# Patient Record
Sex: Male | Born: 1965
Health system: Southern US, Community
[De-identification: ages and names within clinical notes are randomized; demographics above are authoritative.]

## PROBLEM LIST (undated history)

## (undated) DIAGNOSIS — N185 Chronic kidney disease, stage 5: Secondary | ICD-10-CM

## (undated) DIAGNOSIS — E785 Hyperlipidemia, unspecified: Secondary | ICD-10-CM

## (undated) DIAGNOSIS — Z794 Long term (current) use of insulin: Secondary | ICD-10-CM

## (undated) DIAGNOSIS — F32A Depression, unspecified: Secondary | ICD-10-CM

## (undated) DIAGNOSIS — Z95810 Presence of automatic (implantable) cardiac defibrillator: Secondary | ICD-10-CM

## (undated) DIAGNOSIS — D649 Anemia, unspecified: Secondary | ICD-10-CM

## (undated) DIAGNOSIS — E119 Type 2 diabetes mellitus without complications: Secondary | ICD-10-CM

## (undated) DIAGNOSIS — A4159 Other Gram-negative sepsis: Secondary | ICD-10-CM

## (undated) DIAGNOSIS — F209 Schizophrenia, unspecified: Secondary | ICD-10-CM

## (undated) DIAGNOSIS — I10 Essential (primary) hypertension: Secondary | ICD-10-CM

## (undated) DIAGNOSIS — I429 Cardiomyopathy, unspecified: Secondary | ICD-10-CM

## (undated) DIAGNOSIS — R410 Disorientation, unspecified: Secondary | ICD-10-CM

## (undated) DIAGNOSIS — J189 Pneumonia, unspecified organism: Secondary | ICD-10-CM

## (undated) DIAGNOSIS — M3130 Wegener's granulomatosis without renal involvement: Secondary | ICD-10-CM

## (undated) DIAGNOSIS — L0390 Cellulitis, unspecified: Secondary | ICD-10-CM

## (undated) DIAGNOSIS — I4891 Unspecified atrial fibrillation: Secondary | ICD-10-CM

## (undated) HISTORY — DX: Chronic kidney disease, stage 5: N18.5

## (undated) HISTORY — PX: EYE SURGERY: SHX253

---

## 2008-10-28 DIAGNOSIS — I429 Cardiomyopathy, unspecified: Secondary | ICD-10-CM

## 2008-10-28 DIAGNOSIS — Z95810 Presence of automatic (implantable) cardiac defibrillator: Secondary | ICD-10-CM

## 2008-10-28 HISTORY — PX: CARDIAC CATHETERIZATION: SHX172

## 2008-10-28 HISTORY — DX: Cardiomyopathy, unspecified: I42.9

## 2008-10-28 HISTORY — DX: Presence of automatic (implantable) cardiac defibrillator: Z95.810

## 2008-10-28 HISTORY — PX: CARDIAC DEFIBRILLATOR PLACEMENT: SHX171

## 2009-07-21 ENCOUNTER — Encounter: Payer: Self-pay | Admitting: Internal Medicine

## 2009-07-31 ENCOUNTER — Encounter: Payer: Self-pay | Admitting: Internal Medicine

## 2010-01-05 ENCOUNTER — Encounter: Payer: Self-pay | Admitting: Internal Medicine

## 2012-02-26 ENCOUNTER — Encounter (HOSPITAL_BASED_OUTPATIENT_CLINIC_OR_DEPARTMENT_OTHER): Payer: Self-pay | Admitting: Emergency Medicine

## 2012-02-26 ENCOUNTER — Emergency Department (INDEPENDENT_AMBULATORY_CARE_PROVIDER_SITE_OTHER): Payer: Medicaid Other

## 2012-02-26 ENCOUNTER — Emergency Department (HOSPITAL_BASED_OUTPATIENT_CLINIC_OR_DEPARTMENT_OTHER)
Admission: EM | Admit: 2012-02-26 | Discharge: 2012-02-26 | Disposition: A | Discharge status: Home / Self Care | Payer: Medicaid Other | Attending: Emergency Medicine | Admitting: Emergency Medicine

## 2012-02-26 DIAGNOSIS — I10 Essential (primary) hypertension: Secondary | ICD-10-CM | POA: Insufficient documentation

## 2012-02-26 DIAGNOSIS — R109 Unspecified abdominal pain: Secondary | ICD-10-CM | POA: Insufficient documentation

## 2012-02-26 DIAGNOSIS — R634 Abnormal weight loss: Secondary | ICD-10-CM | POA: Insufficient documentation

## 2012-02-26 DIAGNOSIS — R197 Diarrhea, unspecified: Secondary | ICD-10-CM | POA: Insufficient documentation

## 2012-02-26 DIAGNOSIS — E785 Hyperlipidemia, unspecified: Secondary | ICD-10-CM | POA: Insufficient documentation

## 2012-02-26 DIAGNOSIS — Z79899 Other long term (current) drug therapy: Secondary | ICD-10-CM | POA: Insufficient documentation

## 2012-02-26 DIAGNOSIS — Z8659 Personal history of other mental and behavioral disorders: Secondary | ICD-10-CM | POA: Insufficient documentation

## 2012-02-26 DIAGNOSIS — I4891 Unspecified atrial fibrillation: Secondary | ICD-10-CM | POA: Insufficient documentation

## 2012-02-26 HISTORY — DX: Other gram-negative sepsis: A41.59

## 2012-02-26 HISTORY — DX: Unspecified atrial fibrillation: I48.91

## 2012-02-26 HISTORY — DX: Schizophrenia, unspecified: F20.9

## 2012-02-26 HISTORY — DX: Essential (primary) hypertension: I10

## 2012-02-26 HISTORY — DX: Anemia, unspecified: D64.9

## 2012-02-26 HISTORY — DX: Cellulitis, unspecified: L03.90

## 2012-02-26 HISTORY — DX: Hyperlipidemia, unspecified: E78.5

## 2012-02-26 LAB — DIFFERENTIAL
Basophils Absolute: 0 10*3/uL (ref 0.0–0.1)
Basophils Relative: 0 % (ref 0–1)
Eosinophils Absolute: 0.2 10*3/uL (ref 0.0–0.7)
Eosinophils Relative: 3 % (ref 0–5)
Lymphocytes Relative: 25 % (ref 12–46)
Lymphs Abs: 1.7 10*3/uL (ref 0.7–4.0)
Monocytes Absolute: 0.7 10*3/uL (ref 0.1–1.0)
Monocytes Relative: 10 % (ref 3–12)
Neutro Abs: 4.3 10*3/uL (ref 1.7–7.7)
Neutrophils Relative %: 62 % (ref 43–77)

## 2012-02-26 LAB — COMPREHENSIVE METABOLIC PANEL
ALT: 13 U/L (ref 0–53)
AST: 20 U/L (ref 0–37)
Albumin: 3.9 g/dL (ref 3.5–5.2)
Alkaline Phosphatase: 48 U/L (ref 39–117)
BUN: 15 mg/dL (ref 6–23)
CO2: 30 mEq/L (ref 19–32)
Calcium: 9.4 mg/dL (ref 8.4–10.5)
Chloride: 104 mEq/L (ref 96–112)
Creatinine, Ser: 1 mg/dL (ref 0.50–1.35)
GFR calc Af Amer: >90 mL/min (ref >90)
GFR calc non Af Amer: 89 mL/min — ABNORMAL LOW (ref >90)
Glucose, Bld: 91 mg/dL (ref 70–99)
Potassium: 4.6 mEq/L (ref 3.5–5.1)
Sodium: 140 mEq/L (ref 135–145)
Total Bilirubin: 0.6 mg/dL (ref 0.3–1.2)
Total Protein: 7 g/dL (ref 6.0–8.3)

## 2012-02-26 LAB — CBC
HCT: 42.6 % (ref 39.0–52.0)
Hemoglobin: 14.9 g/dL (ref 13.0–17.0)
MCH: 30.8 pg (ref 26.0–34.0)
MCHC: 35 g/dL (ref 30.0–36.0)
MCV: 88 fL (ref 78.0–100.0)
Platelets: 177 10*3/uL (ref 150–400)
RBC: 4.84 MIL/uL (ref 4.22–5.81)
RDW: 12.4 % (ref 11.5–15.5)
WBC: 7 10*3/uL (ref 4.0–10.5)

## 2012-02-26 LAB — URINALYSIS, ROUTINE W REFLEX MICROSCOPIC
Bilirubin Urine: NEGATIVE
Glucose, UA: NEGATIVE mg/dL
Hgb urine dipstick: NEGATIVE
Ketones, ur: NEGATIVE mg/dL
Leukocytes, UA: NEGATIVE
Nitrite: NEGATIVE
Protein, ur: NEGATIVE mg/dL
Specific Gravity, Urine: 1.01 (ref 1.005–1.030)
Urobilinogen, UA: 0.2 mg/dL (ref 0.0–1.0)
pH: 7 (ref 5.0–8.0)

## 2012-02-26 LAB — LIPASE, BLOOD: Lipase: 28 U/L (ref 11–59)

## 2012-02-26 MED ORDER — SODIUM CHLORIDE 0.9 % IV BOLUS (SEPSIS)
500.0000 mL | Freq: Once | INTRAVENOUS | Status: AC
  Administered 2012-02-26: 1000 mL via INTRAVENOUS

## 2012-02-26 MED ORDER — IOHEXOL 300 MG/ML  SOLN
100.0000 mL | Freq: Once | INTRAMUSCULAR | Status: AC | PRN
  Administered 2012-02-26: 100 mL via INTRAVENOUS

## 2012-02-26 MED ORDER — IOHEXOL 300 MG/ML  SOLN
36.0000 mL | Freq: Once | INTRAMUSCULAR | Status: AC | PRN
  Administered 2012-02-26: 36 mL via INTRAVENOUS

## 2012-02-26 MED ORDER — MORPHINE SULFATE 4 MG/ML IJ SOLN
4.0000 mg | Freq: Once | INTRAMUSCULAR | Status: AC
  Administered 2012-02-26: 4 mg via INTRAVENOUS
  Filled 2012-02-26: qty 1

## 2012-02-26 NOTE — ED Provider Notes (Signed)
History     CSN: [id number]  Arrival date & time 02/26/12  [id number]   First MD Initiated Contact with Patient 02/26/12 1000      Chief Complaint  Patient presents with  . Weight Loss    (Consider location/radiation/quality/duration/timing/severity/associated sxs/prior treatment) HPI  H/o HTN, HLD, h/o PANCA glomerulonephritis s/p IV cytoxan and prednisone March 2010, h/o vfib arrest s/p defibrillator, schizophrenia pw multiple complaints. Brother and patient declining [place] interpreter. Per brother patient with increased foul odor of BM and breath at home. States that he's also had 60lb weight loss in 3 years, worse over the last 2 months. Patient without complaints at this time but his brother states "he has mental issues and he never complains of anything". Brother concerned that he may be "sick" and not telling him what's wrong. Pt denies all complaints today. He does state that he has exp cough with white sputum for several days. Denies cp/sob.    ED Notes, ED Provider Notes from 02/26/12 0000 to 02/26/12 09:49:19       Rod Can 02/26/2012 09:42      Brother states he has been loosing weight, foul smelling stool and foul smelling "burp." States he has been like this before and he was "very sick"     Past Medical History  Diagnosis Date  . Hypertension   . Dyslipidemia   . Anemia   . Cellulitis   . Atrial fibrillation   . Enterobacter sepsis   . Schizophrenia     Past Surgical History  Procedure Date  . Cardiac defibrillator placement   . Pacemaker insertion     No family history on file.  Soc hx- no etoh  Review of Systems  All other systems reviewed and are negative.  except as noted HPI   Allergies  Review of patient's allergies indicates no known allergies.  Home Medications   Current Outpatient Rx  Name Route Sig Dispense Refill  . RISPERIDONE 0.5 MG PO TABS Oral Take 0.5 mg by mouth at bedtime.      BP 104/71  Pulse 62  Temp(Src) 98.3 F (36.8  C) (Oral)  Resp 16  Ht 5\' 10"  (1.778 m)  Wt 123 lb (55.792 kg)  BMI 17.65 kg/m2  SpO2 100%  Physical Exam  Nursing note and vitals reviewed. Constitutional: He is oriented to person, place, and time. He appears well-developed and well-nourished. No distress.  HENT:  Head: Atraumatic.  Nose: Nose normal.  Mouth/Throat: Oropharynx is clear and moist. No oropharyngeal exudate.       Poor dentition No tenderness to percussion Gingiva unremarkable  Eyes: Conjunctivae are normal. Pupils are equal, round, and reactive to light.  Neck: Normal range of motion. Neck supple.  Cardiovascular: Normal rate, regular rhythm, normal heart sounds and intact distal pulses.  Exam reveals no gallop and no friction rub.   No murmur heard. Pulmonary/Chest: Effort normal. No respiratory distress. He has no wheezes. He has no rales.  Abdominal: Soft. Bowel sounds are normal. There is tenderness. There is no rebound and no guarding.       Min LUQ/LLQ ttp  Genitourinary:       Brown stool No prostate tenderness  Musculoskeletal: Normal range of motion. He exhibits no edema and no tenderness.  Neurological: He is alert and oriented to person, place, and time. No cranial nerve deficit. He exhibits normal muscle tone. Coordination normal.       Strength 5/5 all extremities No pronator drift No facial droop  Skin: Skin is warm and dry.  Psychiatric: He has a normal mood and affect.    ED Course  Procedures (including critical care time)  Labs Reviewed  COMPREHENSIVE METABOLIC PANEL - Abnormal; Notable for the following:    GFR calc non Af Amer 89 (*)    All other components within normal limits  CBC  DIFFERENTIAL  LIPASE, BLOOD  URINALYSIS, ROUTINE W REFLEX MICROSCOPIC   Dg Chest 2 View  02/26/2012  *RADIOLOGY REPORT*  Clinical Data: Weight loss.  CHEST - 2 VIEW  Comparison: None.  Findings: The lungs are clear without focal infiltrate, edema, pneumothorax or pleural effusion. Cardiopericardial  silhouette is at upper limits of normal for size. Imaged bony structures of the thorax are intact.  IMPRESSION: No acute findings.  Original Report Authenticated By: ERIC A. MANSELL, M.D.   Ct Abdomen Pelvis W Contrast  02/26/2012  *RADIOLOGY REPORT*  Clinical Data: Abdominal pain, weight loss, prior kidney infection  CT ABDOMEN AND PELVIS WITH CONTRAST  Technique:  Multidetector CT imaging of the abdomen and pelvis was performed following the standard protocol during bolus administration of intravenous contrast.  Contrast: 174mL OMNIPAQUE IOHEXOL 300 MG/ML  SOLN  Comparison: None.  Findings: Mild linear scarring at the left lung base.  Cardiomegaly.  ICD lead incompletely visualized.  Liver, spleen, pancreas, and adrenal glands are within normal limits.  Gallbladder is unremarkable.  No intrahepatic or extrahepatic ductal dilatation.  Small left kidney.  Right kidney is within normal limits. Bilateral extrarenal pelvis without hydronephrosis.  No evidence of bowel obstruction.  Appendix is not discretely visualized but there are no inflammatory changes in the right lower quadrant.  No evidence of abdominal aortic aneurysm.  No abdominopelvic ascites.  No suspicious abdominopelvic lymphadenopathy.  Status unremarkable.  Bladder is distended.  Mild degenerative changes at L5-S1.  IMPRESSION: No evidence of bowel obstruction.  Appendix is not discretely visualized but there are no findings to suggest acute appendicitis.  No CT findings to account for the patient's abdominal pain.  Original Report Authenticated By: Julian Hy, M.D.    1. Weight loss   2. Diarrhea   3. Abdominal pain     MDM  Multiple vague complaints. Objectively, min abd ttp. CT A/P unremarkable. Pt c/o cough, CXR without pneumonia. Labs unremarkable. No EMC precluding discharge at this time. Given Precautions for return. PMD f/u.         Blair Heys, MD 02/26/12 1254

## 2012-02-26 NOTE — ED Notes (Signed)
Brother states he has been loosing weight, foul smelling stool and foul smelling "burp."  States he has been like this before and he was "very sick"

## 2012-02-26 NOTE — Discharge Instructions (Signed)
Follow up with your doctor as discussed.  RESOURCE GUIDE  Dental Problems  Patients with Medicaid: [facility] Phone:  [phone]                                                   Phone:  [phone]  If unable to pay or uninsured, contact:  [facility] or [facility]. to become qualified for the adult dental clinic.  Chronic Pain Problems Contact [facility]  [phone] Patients need to be referred by their primary care doctor.  Insufficient Money for Medicine Contact [employer]:  call "211" or [place] [phone].  No Primary Care Doctor Call [facility]  [phone] Other agencies that provide inexpensive medical care    [place]  F2597459    [facility]  [place]  [phone]    [facility]  [phone]    [facility]  [place]  [phone] [facility]  [phone] [place]   [phone] (emergency services [phone])  Abuse/Neglect [place] [phone] [place] [phone] (After Hours)  Emergency [place] [phone]  [place] [facility] [place] [phone] [place] [phone]  MRSA Hotline #:   (754) [phone]    [facility] Dept. [address]         [place] Phone:  [phone]                                  Phone:   [phone]                   Phone:  [place] Phone:  [place] [phone] (After Hours)

## 2012-02-26 NOTE — ED Notes (Signed)
MD at bedside. 

## 2012-02-26 NOTE — ED Notes (Signed)
Patient transported to CT 

## 2012-05-03 ENCOUNTER — Emergency Department (HOSPITAL_BASED_OUTPATIENT_CLINIC_OR_DEPARTMENT_OTHER)
Admission: EM | Admit: 2012-05-03 | Discharge: 2012-05-03 | Disposition: A | Discharge status: Home / Self Care | Payer: Medicaid Other | Attending: Emergency Medicine | Admitting: Emergency Medicine

## 2012-05-03 ENCOUNTER — Encounter (HOSPITAL_BASED_OUTPATIENT_CLINIC_OR_DEPARTMENT_OTHER): Payer: Self-pay | Admitting: *Deleted

## 2012-05-03 ENCOUNTER — Emergency Department (HOSPITAL_BASED_OUTPATIENT_CLINIC_OR_DEPARTMENT_OTHER): Payer: Medicaid Other

## 2012-05-03 DIAGNOSIS — Z8659 Personal history of other mental and behavioral disorders: Secondary | ICD-10-CM | POA: Insufficient documentation

## 2012-05-03 DIAGNOSIS — I10 Essential (primary) hypertension: Secondary | ICD-10-CM | POA: Insufficient documentation

## 2012-05-03 DIAGNOSIS — R7611 Nonspecific reaction to tuberculin skin test without active tuberculosis: Secondary | ICD-10-CM

## 2012-05-03 DIAGNOSIS — I4891 Unspecified atrial fibrillation: Secondary | ICD-10-CM | POA: Insufficient documentation

## 2012-05-03 DIAGNOSIS — E785 Hyperlipidemia, unspecified: Secondary | ICD-10-CM | POA: Insufficient documentation

## 2012-05-03 NOTE — ED Provider Notes (Signed)
History  This chart was scribed for Chauncy Passy, MD by Kevan Rosebush. This patient was seen in room MH11/MH11 and the patient's care was started at 15:04.  CSN: [id number]  Arrival date & time 05/03/12  1440   First MD Initiated Contact with Patient 05/03/12 1504      Chief Complaint  Patient presents with  . PPD Reading    (Consider location/radiation/quality/duration/timing/severity/associated sxs/prior treatment) HPI Translated by family member.   James Nicholson is a 46 y.o. male who presents to the Emergency Department complaining of a Positive PPD test that was placed July 5th. Pt denies any emesis or hemoptysis and has had no known contact with persons with TB. Pt denies any associated coughing. Pt denies having the PPD test preformed previously. Pt is originally from the former [place] and his family reports he likely received the TB vaccination a number of years ago while living there as all of them did. Pt has a h/o coma (3 years ago), atrial fibrilation, HTN, anemia, and has been previously diagnosed with schizophrenia. Niece states that the patient has a defibrillator for the past 3 years after the coma. Pt denies any h/o cancer, prison or prior psychiatric hospitalizations. Pt is being placed in an assisted home care living tomorrow. Niece reports a h/o a form of schizophrenia but denies any hospitalizations. Patient has had no cough, chest pain, shortness of breath, fever, or hemoptysis. Patient had a PPD performed to do to his plan to be admitted to a assisted living. This was noted to be positive and measured at 30 x 30 mm.  Dr. Lovena Le in [place]    Past Medical History  Diagnosis Date  . Hypertension   . Dyslipidemia   . Anemia   . Cellulitis   . Atrial fibrillation   . Enterobacter sepsis   . Schizophrenia     Past Surgical History  Procedure Date  . Cardiac defibrillator placement   . Pacemaker insertion     History reviewed. No pertinent family  history.  History  Substance Use Topics  . Smoking status: Not on file  . Smokeless tobacco: Not on file  . Alcohol Use: No      Review of Systems  Constitutional: Negative.   HENT: Negative.   Eyes: Negative.   Respiratory: Negative.   Cardiovascular: Negative.   Gastrointestinal: Negative.   Genitourinary: Negative.   Musculoskeletal: Negative.   Skin: Negative.   Neurological: Negative.   Hematological: Negative.   Psychiatric/Behavioral: Positive for hallucinations.  All other systems reviewed and are negative.   A complete 10 system review of systems was obtained and all systems are negative except as noted in the HPI and PMH.   Allergies  Review of patient's allergies indicates no known allergies.  Home Medications   Current Outpatient Rx  Name Route Sig Dispense Refill  . RISPERIDONE 0.5 MG PO TABS Oral Take 0.5 mg by mouth at bedtime.      Triage Vitals: BP 109/69  Pulse 62  Temp 99.5 F (37.5 C) (Oral)  Resp 20  Ht 5\' 8"  (1.727 m)  Wt 128 lb (58.06 kg)  BMI 19.46 kg/m2  SpO2 99%  Physical Exam  Nursing note and vitals reviewed. GEN: Well-developed, well-nourished male in no distress HEENT: Atraumatic, normocephalic. Oropharynx clear without erythema EYES: PERRLA BL, no scleral icterus. NECK: Trachea midline, no meningismus CV: regular rate and rhythm. No murmurs, rubs, or gallops PULM: No respiratory distress.  No crackles, wheezes, or rales. GI: soft, non-tender. No  guarding, rebound, or tenderness. + bowel sounds  GU: deferred Neuro: cranial nerves grossly 2-12 intact, no abnormalities of strength or sensation, A and O x 3 MSK: Patient moves all 4 extremities symmetrically, no deformity, edema, or injury noted Skin: No rashes petechiae, purpura, or jaundice. Patient has positive PPD noted on the right forearm with erythema and induration. Psych: Patient with history of hallucinations and delusions chronically. His niece reports that this is a  baseline. There is no suicidal or homicidal ideation.   ED Course  Procedures (including critical care time)  DIAGNOSTIC STUDIES: Oxygen Saturation is 99% on room air, normal by my interpretation.    COORDINATION OF CARE:  1540: Discussed planned course of treatment with the patient, who is agreeable at this time. Will order a chest x-ray.    Labs Reviewed - No data to display Dg Chest 2 View  05/03/2012  *RADIOLOGY REPORT*  Clinical Data: Positive TB skin test.  CHEST - 2 VIEW  Comparison: 02/26/2012.  Findings: Stable normal sized heart and clear lungs.  Stable left subclavian AICD lead.  Normal appearing bones.  IMPRESSION: No evidence of active or previous tuberculosis infection.  Original Report Authenticated By: Gerald Stabs, M.D.     1. Positive PPD       MDM  Patient was evaluated by myself. Based on evaluation patient had chest x-ray performed which was negative. He was having no chest pain or shortness of breath. There were no lesions whatsoever to suggest tuberculosis. Patient may well have had positive results do to prior vaccination. I discussed the patient with Dr. Megan Salon of infectious diseases. He recommended the quantiferon gold assay.  Patient does not require airborne precautions at this time. He should be able to be admitted to his assisted living tomorrow as planned. This should come back in a couple of days. If it is negative the patient will not need any treatment. If it is positive he will need treatment meds for latent tuberculosis with INH. Patient's family was notified of this. Patient was discharged with copy of his chest x-ray showed the assisted living require it.  I personally performed the services described in this documentation, which was scribed in my presence. The recorded information has been reviewed and considered.         Chauncy Passy, MD 05/03/12 1726

## 2012-05-03 NOTE — ED Notes (Addendum)
Sent here from [facility] for +TB test. Site is red and swollen. Mask placed on pt , triaged and taken to Neg Pressure room.

## 2012-05-04 LAB — QUANTIFERON TB GOLD ASSAY (BLOOD)

## 2012-05-05 ENCOUNTER — Telehealth (HOSPITAL_BASED_OUTPATIENT_CLINIC_OR_DEPARTMENT_OTHER): Payer: Self-pay | Admitting: *Deleted

## 2012-05-05 NOTE — ED Notes (Signed)
Received a call from the Lab tech on 05/05/11 stating that the Quantiferon Gold Assay test was canceled due to being in the wrong tubes.  QGA requires a special kit which is not available at the [facility] lab.  Spoke with Dr. Hale Bogus office r/t consult done on 05/03/12 and was told that they can not follow up with this due to being a consult and not having the patient as a regular patient.  Call placed to Dr. Selinda Michaels office due to him showing as the pt's PCP.  Was told that the pt is a new pt for them and that they have not seen him and can not give orders for additional testing due to them being cardiology.  Chart reviewed with Dr. Dorna Mai, told to refer patient to the nursing home physician for continued out patient testing.  Call placed to home phone and spoke with the brother of [facility], and he will contact the nursing home to confirm if testing is still necessary Stated he will take the pt to Urgent care if needed.  Explained to brother and niece the name of the test and stressed that the blood will need to be drawn in a special kit and to make sure that the proper kit is used where ever the test is performed.  Verbalized understanding.

## 2012-05-07 ENCOUNTER — Encounter: Payer: Self-pay | Admitting: Internal Medicine

## 2012-05-07 ENCOUNTER — Ambulatory Visit (INDEPENDENT_AMBULATORY_CARE_PROVIDER_SITE_OTHER): Payer: Medicaid Other | Admitting: Internal Medicine

## 2012-05-07 VITALS — BP 122/68 | HR 67 | Ht 68.0 in | Wt 132.0 lb

## 2012-05-07 DIAGNOSIS — Z95810 Presence of automatic (implantable) cardiac defibrillator: Secondary | ICD-10-CM

## 2012-05-07 DIAGNOSIS — I428 Other cardiomyopathies: Secondary | ICD-10-CM | POA: Insufficient documentation

## 2012-05-07 DIAGNOSIS — I4901 Ventricular fibrillation: Secondary | ICD-10-CM

## 2012-05-07 DIAGNOSIS — I10 Essential (primary) hypertension: Secondary | ICD-10-CM | POA: Insufficient documentation

## 2012-05-07 LAB — ICD DEVICE OBSERVATION
BATTERY VOLTAGE: 3.1261 V
BRDY-0002RV: 40 {beats}/min
CHARGE TIME: 8.808 s
DEV-0020ICD: NEGATIVE
FVT: 0
PACEART VT: 0
RV LEAD AMPLITUDE: 3.375 mv
RV LEAD IMPEDENCE ICD: 684 Ohm
RV LEAD THRESHOLD: 0.75 V
TOT-0001: 9
TOT-0002: 0
TOT-0006: 20100415000000
TZAT-0001FASTVT: 1
TZAT-0001FASTVT: 2
TZAT-0001SLOWVT: 1
TZAT-0001SLOWVT: 2
TZAT-0002SLOWVT: NEGATIVE
TZAT-0002SLOWVT: NEGATIVE
TZAT-0004FASTVT: 10
TZAT-0004FASTVT: 10
TZAT-0005FASTVT: 88 pct
TZAT-0005FASTVT: 91 pct
TZAT-0011FASTVT: 10 ms
TZAT-0011FASTVT: 10 ms
TZAT-0012FASTVT: 200 ms
TZAT-0012FASTVT: 200 ms
TZAT-0012SLOWVT: 200 ms
TZAT-0012SLOWVT: 200 ms
TZAT-0013FASTVT: 1
TZAT-0013FASTVT: 2
TZAT-0018FASTVT: NEGATIVE
TZAT-0018FASTVT: NEGATIVE
TZAT-0018SLOWVT: NEGATIVE
TZAT-0018SLOWVT: NEGATIVE
TZAT-0019FASTVT: 8 V
TZAT-0019FASTVT: 8 V
TZAT-0019SLOWVT: 8 V
TZAT-0019SLOWVT: 8 V
TZAT-0020FASTVT: 1.5 ms
TZAT-0020FASTVT: 1.5 ms
TZAT-0020SLOWVT: 1.5 ms
TZAT-0020SLOWVT: 1.5 ms
TZON-0003FASTVT: 270 ms
TZON-0003SLOWVT: 390 ms
TZON-0003VSLOWVT: 450 ms
TZON-0004SLOWVT: 16
TZON-0004VSLOWVT: 20
TZON-0005SLOWVT: 12
TZST-0001FASTVT: 3
TZST-0001FASTVT: 4
TZST-0001FASTVT: 5
TZST-0001FASTVT: 6
TZST-0001SLOWVT: 3
TZST-0001SLOWVT: 4
TZST-0001SLOWVT: 5
TZST-0001SLOWVT: 6
TZST-0002SLOWVT: NEGATIVE
TZST-0002SLOWVT: NEGATIVE
TZST-0002SLOWVT: NEGATIVE
TZST-0002SLOWVT: NEGATIVE
TZST-0003FASTVT: 20 J
TZST-0003FASTVT: 35 J
TZST-0003FASTVT: 35 J
TZST-0003FASTVT: 35 J
VENTRICULAR PACING ICD: 0 pct
VF: 0

## 2012-05-07 NOTE — Patient Instructions (Addendum)
Your physician recommends that you schedule a follow-up appointment in: 3 months in the device clinic and 12 months with Dr Taylor  

## 2012-05-07 NOTE — Assessment & Plan Note (Signed)
His device is working normally. We'll plan to recheck in several months. He has had no recurrent ventricular arrhythmias.

## 2012-05-07 NOTE — Assessment & Plan Note (Signed)
His ventricular arrhythmias have remained well controlled.

## 2012-05-07 NOTE — Progress Notes (Signed)
HPI James Nicholson is referred today for for ongoing evaluation and management of his ICD. The patient is a very pleasant 46 year old man with multiple medical problems. He had a resuscitated ventricular fibrillation arrest several years ago and underwent ICD implantation. He also has glomerulonephritis which has responded to Cytoxan and prednisone. He has schizophrenia. The patient recently moved from [facility] to [place]. He moved here to be closer to his brother. He denies any recent ICD shocks. He has class II heart failure symptoms. No Known Allergies   Current Outpatient Prescriptions  Medication Sig Dispense Refill  . risperiDONE (RISPERDAL) 0.5 MG tablet Take 0.5 mg by mouth daily as needed.          Past Medical History  Diagnosis Date  . Hypertension   . Dyslipidemia   . Anemia   . Cellulitis   . Atrial fibrillation   . Enterobacter sepsis   . Schizophrenia     ROS:   All systems reviewed and negative except as noted in the HPI.   Past Surgical History  Procedure Date  . Cardiac defibrillator placement   . Pacemaker insertion      No family history on file.   History   Social History  . Marital Status: Single    Spouse Name: N/A    Number of Children: N/A  . Years of Education: N/A   Occupational History  . Not on file.   Social History Main Topics  . Smoking status: Former Smoker    Quit date: 10/28/2008  . Smokeless tobacco: Not on file  . Alcohol Use: No  . Drug Use: No  . Sexually Active: Not on file   Other Topics Concern  . Not on file   Social History Narrative  . No narrative on file     BP 122/68  Pulse 67  Ht 5\' 8"  (1.727 m)  Wt 132 lb (59.875 kg)  BMI 20.07 kg/m2  Physical Exam:  Well appearing middle-aged man, NAD HEENT: Unremarkable Neck:  No JVD, no thyromegally Lungs:  Clear with no wheezes, rales, or rhonchi. Well-healed ICD incision. HEART:  Regular rate rhythm, no murmurs, no rubs, no  clicks Abd:  soft, positive bowel sounds, no organomegally, no rebound, no guarding Ext:  2 plus pulses, no edema, no cyanosis, no clubbing Skin:  No rashes no nodules Neuro:  CN II through XII intact, motor grossly intact  DEVICE  Normal device function.  See PaceArt for details.   Assess/Plan:

## 2012-05-22 ENCOUNTER — Encounter: Payer: Self-pay | Admitting: Internal Medicine

## 2012-05-22 ENCOUNTER — Ambulatory Visit (INDEPENDENT_AMBULATORY_CARE_PROVIDER_SITE_OTHER): Payer: Medicaid Other | Admitting: Internal Medicine

## 2012-05-22 VITALS — BP 119/76 | HR 69 | Temp 97.7°F | Wt 130.0 lb

## 2012-05-22 DIAGNOSIS — Z227 Latent tuberculosis: Secondary | ICD-10-CM

## 2012-05-22 DIAGNOSIS — A150 Tuberculosis of lung: Secondary | ICD-10-CM

## 2012-05-22 DIAGNOSIS — R7611 Nonspecific reaction to tuberculin skin test without active tuberculosis: Secondary | ICD-10-CM

## 2012-05-22 LAB — COMPLETE METABOLIC PANEL WITH GFR
ALT: 20 U/L (ref 0–53)
AST: 22 U/L (ref 0–37)
Albumin: 4.5 g/dL (ref 3.5–5.2)
Alkaline Phosphatase: 48 U/L (ref 39–117)
BUN: 16 mg/dL (ref 6–23)
CO2: 30 mEq/L (ref 19–32)
Calcium: 9.6 mg/dL (ref 8.4–10.5)
Chloride: 101 mEq/L (ref 96–112)
Creat: 1.15 mg/dL (ref 0.50–1.35)
GFR, Est African American: 88 mL/min
GFR, Est Non African American: 76 mL/min
Glucose, Bld: 56 mg/dL — ABNORMAL LOW (ref 70–99)
Potassium: 4.4 mEq/L (ref 3.5–5.3)
Sodium: 139 mEq/L (ref 135–145)
Total Bilirubin: 0.8 mg/dL (ref 0.3–1.2)
Total Protein: 7 g/dL (ref 6.0–8.3)

## 2012-05-22 LAB — CBC WITH DIFFERENTIAL/PLATELET
Basophils Absolute: 0 10*3/uL (ref 0.0–0.1)
Basophils Relative: 1 % (ref 0–1)
Eosinophils Absolute: 0.3 10*3/uL (ref 0.0–0.7)
Eosinophils Relative: 5 % (ref 0–5)
HCT: 43.4 % (ref 39.0–52.0)
Hemoglobin: 15.7 g/dL (ref 13.0–17.0)
Lymphocytes Relative: 34 % (ref 12–46)
Lymphs Abs: 2.1 10*3/uL (ref 0.7–4.0)
MCH: 31 pg (ref 26.0–34.0)
MCHC: 36.2 g/dL — ABNORMAL HIGH (ref 30.0–36.0)
MCV: 85.8 fL (ref 78.0–100.0)
Monocytes Absolute: 0.6 10*3/uL (ref 0.1–1.0)
Monocytes Relative: 10 % (ref 3–12)
Neutro Abs: 3.1 10*3/uL (ref 1.7–7.7)
Neutrophils Relative %: 50 % (ref 43–77)
Platelets: 192 10*3/uL (ref 150–400)
RBC: 5.06 MIL/uL (ref 4.22–5.81)
RDW: 13.3 % (ref 11.5–15.5)
WBC: 6.1 10*3/uL (ref 4.0–10.5)

## 2012-05-22 MED ORDER — ISONIAZID 300 MG PO TABS: 300.0000 mg | ORAL_TABLET | Freq: Every day | ORAL | Status: DC

## 2012-05-22 MED ORDER — PYRIDOXINE HCL 50 MG PO TABS: 50.0000 mg | ORAL_TABLET | Freq: Every day | ORAL | Status: DC

## 2012-05-22 MED ORDER — ISONIAZID 300 MG PO TABS: 300.0000 mg | ORAL_TABLET | Freq: Every day | ORAL | Status: AC

## 2012-05-25 DIAGNOSIS — R7611 Nonspecific reaction to tuberculin skin test without active tuberculosis: Secondary | ICD-10-CM | POA: Insufficient documentation

## 2012-05-25 NOTE — Progress Notes (Signed)
  Subjective:    Patient ID: James Nicholson, male    DOB: [birth year], 46 y.o.   MRN: [id number]  HPI New patient who comes in for evaluation of a positive ppd.  He was being admitted to a nursing home and was sent to the ED due to the positive ppd.  He is origninally from [place].  Denies any known history of Tb exposure, has never been in jail and no recent positive HIV.  He did have a Quantiferon sent from ED but it was in the wrong tube.  No cough, no SOB, no weight loss no night sweats.  He was evaluated in ED and no active signs of disease on CXR/CT.  He is here with his brother who is translating.  He has had some mental incapacity relating to a cardiovascular accident and has limited understanding, according to his brother.     Review of Systems  Constitutional: Negative for fever, chills, activity change, appetite change, fatigue and unexpected weight change.  Respiratory: Negative for cough, chest tightness, shortness of breath, wheezing and stridor.   Cardiovascular: Negative for chest pain, palpitations and leg swelling.  Gastrointestinal: Negative for vomiting, abdominal pain, diarrhea, constipation, abdominal distention and rectal pain.  Musculoskeletal: Negative for myalgias, joint swelling and arthralgias.  Skin: Negative for rash.  Hematological: Negative for adenopathy.  Psychiatric/Behavioral: The patient is not nervous/anxious.        Objective:   Physical Exam  Constitutional: He appears well-developed and well-nourished. No distress.  Cardiovascular: Normal rate, regular rhythm and normal heart sounds.  Exam reveals no gallop and no friction rub.   No murmur heard. Pulmonary/Chest: Effort normal and breath sounds normal. No respiratory distress. He has no wheezes. He has no rales.  Abdominal: Soft. Bowel sounds are normal. He exhibits no distension. There is no tenderness. There is no rebound.  Skin: No rash noted.          Assessment & Plan:

## 2012-05-25 NOTE — Assessment & Plan Note (Signed)
He does have a positive ppd, confirmed in the ED on their exam.  I will treat him with 9 months of INH.  LFTs today.  RTC in one month.  Side effects described.

## 2012-06-15 ENCOUNTER — Encounter: Payer: Self-pay | Admitting: Internal Medicine

## 2012-06-23 ENCOUNTER — Encounter: Payer: Self-pay | Admitting: Internal Medicine

## 2012-06-23 ENCOUNTER — Ambulatory Visit (INDEPENDENT_AMBULATORY_CARE_PROVIDER_SITE_OTHER): Payer: Medicaid Other | Admitting: Internal Medicine

## 2012-06-23 VITALS — BP 107/74 | HR 59 | Temp 98.1°F | Ht 67.0 in | Wt 133.0 lb

## 2012-06-23 DIAGNOSIS — R7611 Nonspecific reaction to tuberculin skin test without active tuberculosis: Secondary | ICD-10-CM

## 2012-06-23 NOTE — Progress Notes (Signed)
  Subjective:    Patient ID: James Nicholson, male    DOB: 06/21/1966, 46 y.o.   MRN: [id number]  HPI He comes in here for followup after starting [place] for positive PPD. He was also placed on vitamin B6. He continues to live in a nursing home and is accompanied by someone from the facility. He started one month ago and is tolerating the medicine well. No abnormal pain, minimal episodes of nausea but no weight loss and no difficulty eating. He has no specific complaints today   Review of Systems  Constitutional: Negative for fever, chills, fatigue and unexpected weight change.  Gastrointestinal: Negative for nausea, vomiting, abdominal pain, diarrhea, constipation and abdominal distention.  Musculoskeletal: Negative for myalgias, joint swelling and arthralgias.  Skin: Negative for rash.  Psychiatric/Behavioral: The patient is not nervous/anxious.        Objective:   Physical Exam  Constitutional: He appears well-developed and well-nourished. No distress.  Cardiovascular: Normal rate, regular rhythm and normal heart sounds.  Exam reveals no gallop and no friction rub.   No murmur heard. Pulmonary/Chest: Effort normal and breath sounds normal. No respiratory distress. He has no wheezes. He has no rales.  Abdominal: Soft. Bowel sounds are normal. He exhibits no distension. There is no tenderness. There is no rebound.  Skin: Skin is warm and dry. No rash noted.  Psychiatric: He has a normal mood and affect.          Assessment & Plan:

## 2012-06-23 NOTE — Assessment & Plan Note (Signed)
He is doing well with this therapy. No indication to check labs as he has no significant symptoms. He will continue to complete 8 more months of therapy. I will have him followup towards the end of his therapy to assure he has continued and is doing well. He knows to call if he has any significant side effects prior to that to be seen.

## 2012-08-04 ENCOUNTER — Encounter: Payer: Self-pay | Admitting: *Deleted

## 2012-08-13 ENCOUNTER — Ambulatory Visit (INDEPENDENT_AMBULATORY_CARE_PROVIDER_SITE_OTHER): Payer: Medicaid Other | Admitting: *Deleted

## 2012-08-13 DIAGNOSIS — I4901 Ventricular fibrillation: Secondary | ICD-10-CM

## 2012-08-13 DIAGNOSIS — I428 Other cardiomyopathies: Secondary | ICD-10-CM

## 2012-08-13 LAB — ICD DEVICE OBSERVATION
BATTERY VOLTAGE: 3.0848 V
BRDY-0002RV: 40 {beats}/min
CHARGE TIME: 8.898 s
DEV-0020ICD: NEGATIVE
FVT: 0
PACEART VT: 0
RV LEAD AMPLITUDE: 3.75 mv
RV LEAD IMPEDENCE ICD: 646 Ohm
RV LEAD THRESHOLD: 0.5 V
TOT-0001: 9
TOT-0002: 0
TOT-0006: 20100415000000
TZAT-0001FASTVT: 1
TZAT-0001FASTVT: 2
TZAT-0001SLOWVT: 1
TZAT-0001SLOWVT: 2
TZAT-0002SLOWVT: NEGATIVE
TZAT-0002SLOWVT: NEGATIVE
TZAT-0004FASTVT: 10
TZAT-0004FASTVT: 10
TZAT-0005FASTVT: 88 pct
TZAT-0005FASTVT: 91 pct
TZAT-0011FASTVT: 10 ms
TZAT-0011FASTVT: 10 ms
TZAT-0012FASTVT: 200 ms
TZAT-0012FASTVT: 200 ms
TZAT-0012SLOWVT: 200 ms
TZAT-0012SLOWVT: 200 ms
TZAT-0013FASTVT: 1
TZAT-0013FASTVT: 2
TZAT-0018FASTVT: NEGATIVE
TZAT-0018FASTVT: NEGATIVE
TZAT-0018SLOWVT: NEGATIVE
TZAT-0018SLOWVT: NEGATIVE
TZAT-0019FASTVT: 8 V
TZAT-0019FASTVT: 8 V
TZAT-0019SLOWVT: 8 V
TZAT-0019SLOWVT: 8 V
TZAT-0020FASTVT: 1.5 ms
TZAT-0020FASTVT: 1.5 ms
TZAT-0020SLOWVT: 1.5 ms
TZAT-0020SLOWVT: 1.5 ms
TZON-0003FASTVT: 270 ms
TZON-0003SLOWVT: 390 ms
TZON-0003VSLOWVT: 450 ms
TZON-0004SLOWVT: 16
TZON-0004VSLOWVT: 20
TZON-0005SLOWVT: 12
TZST-0001FASTVT: 3
TZST-0001FASTVT: 4
TZST-0001FASTVT: 5
TZST-0001FASTVT: 6
TZST-0001SLOWVT: 3
TZST-0001SLOWVT: 4
TZST-0001SLOWVT: 5
TZST-0001SLOWVT: 6
TZST-0002SLOWVT: NEGATIVE
TZST-0002SLOWVT: NEGATIVE
TZST-0002SLOWVT: NEGATIVE
TZST-0002SLOWVT: NEGATIVE
TZST-0003FASTVT: 20 J
TZST-0003FASTVT: 35 J
TZST-0003FASTVT: 35 J
TZST-0003FASTVT: 35 J
VENTRICULAR PACING ICD: 0.02 pct
VF: 0

## 2012-08-13 NOTE — Progress Notes (Signed)
ICD check with ICM 

## 2012-08-31 ENCOUNTER — Encounter: Payer: Self-pay | Admitting: Internal Medicine

## 2012-11-12 ENCOUNTER — Encounter: Payer: Self-pay | Admitting: Internal Medicine

## 2012-11-27 ENCOUNTER — Encounter: Payer: Self-pay | Admitting: Internal Medicine

## 2012-12-24 ENCOUNTER — Ambulatory Visit: Payer: Medicaid Other | Admitting: Internal Medicine

## 2012-12-28 ENCOUNTER — Other Ambulatory Visit: Payer: Self-pay | Admitting: Internal Medicine

## 2012-12-28 ENCOUNTER — Encounter: Payer: Self-pay | Admitting: Internal Medicine

## 2012-12-28 ENCOUNTER — Ambulatory Visit (INDEPENDENT_AMBULATORY_CARE_PROVIDER_SITE_OTHER): Payer: Medicaid Other | Admitting: *Deleted

## 2012-12-28 DIAGNOSIS — I4901 Ventricular fibrillation: Secondary | ICD-10-CM

## 2012-12-28 DIAGNOSIS — I428 Other cardiomyopathies: Secondary | ICD-10-CM

## 2012-12-28 NOTE — Progress Notes (Signed)
ICD check with ICM 

## 2012-12-29 LAB — ICD DEVICE OBSERVATION
BATTERY VOLTAGE: 3.1057 V
BRDY-0002RV: 40 {beats}/min
CHARGE TIME: 8.898 s
DEV-0020ICD: NEGATIVE
FVT: 0
PACEART VT: 0
RV LEAD AMPLITUDE: 3.5 mv
RV LEAD IMPEDENCE ICD: 627 Ohm
RV LEAD THRESHOLD: 0.75 V
TOT-0001: 9
TOT-0002: 0
TOT-0006: 20100415000000
TZAT-0001FASTVT: 1
TZAT-0001FASTVT: 2
TZAT-0001SLOWVT: 1
TZAT-0001SLOWVT: 2
TZAT-0002SLOWVT: NEGATIVE
TZAT-0002SLOWVT: NEGATIVE
TZAT-0004FASTVT: 10
TZAT-0004FASTVT: 10
TZAT-0005FASTVT: 88 pct
TZAT-0005FASTVT: 91 pct
TZAT-0011FASTVT: 10 ms
TZAT-0011FASTVT: 10 ms
TZAT-0012FASTVT: 200 ms
TZAT-0012FASTVT: 200 ms
TZAT-0012SLOWVT: 200 ms
TZAT-0012SLOWVT: 200 ms
TZAT-0013FASTVT: 1
TZAT-0013FASTVT: 2
TZAT-0018FASTVT: NEGATIVE
TZAT-0018FASTVT: NEGATIVE
TZAT-0018SLOWVT: NEGATIVE
TZAT-0018SLOWVT: NEGATIVE
TZAT-0019FASTVT: 8 V
TZAT-0019FASTVT: 8 V
TZAT-0019SLOWVT: 8 V
TZAT-0019SLOWVT: 8 V
TZAT-0020FASTVT: 1.5 ms
TZAT-0020FASTVT: 1.5 ms
TZAT-0020SLOWVT: 1.5 ms
TZAT-0020SLOWVT: 1.5 ms
TZON-0003FASTVT: 270 ms
TZON-0003SLOWVT: 390 ms
TZON-0003VSLOWVT: 450 ms
TZON-0004SLOWVT: 32
TZON-0004VSLOWVT: 20
TZON-0005SLOWVT: 12
TZST-0001FASTVT: 3
TZST-0001FASTVT: 4
TZST-0001FASTVT: 5
TZST-0001FASTVT: 6
TZST-0001SLOWVT: 3
TZST-0001SLOWVT: 4
TZST-0001SLOWVT: 5
TZST-0001SLOWVT: 6
TZST-0002SLOWVT: NEGATIVE
TZST-0002SLOWVT: NEGATIVE
TZST-0002SLOWVT: NEGATIVE
TZST-0002SLOWVT: NEGATIVE
TZST-0003FASTVT: 20 J
TZST-0003FASTVT: 35 J
TZST-0003FASTVT: 35 J
TZST-0003FASTVT: 35 J
VENTRICULAR PACING ICD: 0.01 pct
VF: 0

## 2013-01-26 ENCOUNTER — Ambulatory Visit: Payer: Medicaid Other | Admitting: Internal Medicine

## 2013-02-04 ENCOUNTER — Encounter (HOSPITAL_COMMUNITY): Payer: Self-pay | Admitting: Emergency Medicine

## 2013-02-04 ENCOUNTER — Inpatient Hospital Stay (HOSPITAL_COMMUNITY)
Admission: EM | Admit: 2013-02-04 | Discharge: 2013-02-11 | DRG: 673 | Payer: Medicaid Other | Attending: Internal Medicine | Admitting: Internal Medicine

## 2013-02-04 ENCOUNTER — Emergency Department (HOSPITAL_COMMUNITY): Payer: Medicaid Other

## 2013-02-04 DIAGNOSIS — Z95810 Presence of automatic (implantable) cardiac defibrillator: Secondary | ICD-10-CM

## 2013-02-04 DIAGNOSIS — E872 Acidosis, unspecified: Secondary | ICD-10-CM | POA: Diagnosis present

## 2013-02-04 DIAGNOSIS — R7611 Nonspecific reaction to tuberculin skin test without active tuberculosis: Secondary | ICD-10-CM | POA: Diagnosis present

## 2013-02-04 DIAGNOSIS — R195 Other fecal abnormalities: Secondary | ICD-10-CM | POA: Diagnosis present

## 2013-02-04 DIAGNOSIS — D649 Anemia, unspecified: Secondary | ICD-10-CM

## 2013-02-04 DIAGNOSIS — N179 Acute kidney failure, unspecified: Secondary | ICD-10-CM | POA: Diagnosis present

## 2013-02-04 DIAGNOSIS — F209 Schizophrenia, unspecified: Secondary | ICD-10-CM | POA: Diagnosis present

## 2013-02-04 DIAGNOSIS — N189 Chronic kidney disease, unspecified: Secondary | ICD-10-CM | POA: Diagnosis present

## 2013-02-04 DIAGNOSIS — N19 Unspecified kidney failure: Secondary | ICD-10-CM

## 2013-02-04 DIAGNOSIS — R197 Diarrhea, unspecified: Secondary | ICD-10-CM | POA: Diagnosis present

## 2013-02-04 DIAGNOSIS — R609 Edema, unspecified: Secondary | ICD-10-CM | POA: Diagnosis present

## 2013-02-04 DIAGNOSIS — M3130 Wegener's granulomatosis without renal involvement: Secondary | ICD-10-CM | POA: Diagnosis present

## 2013-02-04 DIAGNOSIS — R040 Epistaxis: Secondary | ICD-10-CM | POA: Diagnosis present

## 2013-02-04 DIAGNOSIS — F909 Attention-deficit hyperactivity disorder, unspecified type: Secondary | ICD-10-CM | POA: Diagnosis present

## 2013-02-04 DIAGNOSIS — R7309 Other abnormal glucose: Secondary | ICD-10-CM | POA: Diagnosis present

## 2013-02-04 DIAGNOSIS — D631 Anemia in chronic kidney disease: Secondary | ICD-10-CM | POA: Diagnosis present

## 2013-02-04 DIAGNOSIS — R5381 Other malaise: Secondary | ICD-10-CM | POA: Diagnosis present

## 2013-02-04 DIAGNOSIS — K922 Gastrointestinal hemorrhage, unspecified: Secondary | ICD-10-CM | POA: Diagnosis present

## 2013-02-04 DIAGNOSIS — R42 Dizziness and giddiness: Secondary | ICD-10-CM | POA: Diagnosis present

## 2013-02-04 DIAGNOSIS — Z8249 Family history of ischemic heart disease and other diseases of the circulatory system: Secondary | ICD-10-CM

## 2013-02-04 DIAGNOSIS — J189 Pneumonia, unspecified organism: Secondary | ICD-10-CM | POA: Diagnosis present

## 2013-02-04 DIAGNOSIS — Z9119 Patient's noncompliance with other medical treatment and regimen: Secondary | ICD-10-CM

## 2013-02-04 DIAGNOSIS — T380X5A Adverse effect of glucocorticoids and synthetic analogues, initial encounter: Secondary | ICD-10-CM | POA: Diagnosis present

## 2013-02-04 DIAGNOSIS — N186 End stage renal disease: Secondary | ICD-10-CM | POA: Diagnosis present

## 2013-02-04 DIAGNOSIS — I428 Other cardiomyopathies: Secondary | ICD-10-CM | POA: Diagnosis present

## 2013-02-04 DIAGNOSIS — D62 Acute posthemorrhagic anemia: Secondary | ICD-10-CM | POA: Diagnosis present

## 2013-02-04 DIAGNOSIS — Z87891 Personal history of nicotine dependence: Secondary | ICD-10-CM

## 2013-02-04 DIAGNOSIS — Z91199 Patient's noncompliance with other medical treatment and regimen due to unspecified reason: Secondary | ICD-10-CM

## 2013-02-04 DIAGNOSIS — E875 Hyperkalemia: Secondary | ICD-10-CM | POA: Diagnosis present

## 2013-02-04 LAB — COMPREHENSIVE METABOLIC PANEL
ALT: <5 U/L (ref 0–53)
AST: 16 U/L (ref 0–37)
Albumin: 2.8 g/dL — ABNORMAL LOW (ref 3.5–5.2)
Alkaline Phosphatase: 52 U/L (ref 39–117)
BUN: 91 mg/dL — ABNORMAL HIGH (ref 6–23)
CO2: 15 mEq/L — ABNORMAL LOW (ref 19–32)
Calcium: 8.8 mg/dL (ref 8.4–10.5)
Chloride: 103 mEq/L (ref 96–112)
Creatinine, Ser: 10.25 mg/dL — ABNORMAL HIGH (ref 0.50–1.35)
GFR calc Af Amer: 6 mL/min — ABNORMAL LOW (ref >90)
GFR calc non Af Amer: 5 mL/min — ABNORMAL LOW (ref >90)
Glucose, Bld: 124 mg/dL — ABNORMAL HIGH (ref 70–99)
Potassium: 5.7 mEq/L — ABNORMAL HIGH (ref 3.5–5.1)
Sodium: 133 mEq/L — ABNORMAL LOW (ref 135–145)
Total Bilirubin: 0.5 mg/dL (ref 0.3–1.2)
Total Protein: 7.1 g/dL (ref 6.0–8.3)

## 2013-02-04 LAB — OCCULT BLOOD, POC DEVICE: Fecal Occult Bld: POSITIVE — AB

## 2013-02-04 LAB — CBC WITH DIFFERENTIAL/PLATELET
Basophils Absolute: 0 10*3/uL (ref 0.0–0.1)
Basophils Relative: 0 % (ref 0–1)
Eosinophils Absolute: 0.5 10*3/uL (ref 0.0–0.7)
Eosinophils Relative: 5 % (ref 0–5)
HCT: 20.2 % — ABNORMAL LOW (ref 39.0–52.0)
Hemoglobin: 7 g/dL — ABNORMAL LOW (ref 13.0–17.0)
Lymphocytes Relative: 15 % (ref 12–46)
Lymphs Abs: 1.4 10*3/uL (ref 0.7–4.0)
MCH: 28.5 pg (ref 26.0–34.0)
MCHC: 34.7 g/dL (ref 30.0–36.0)
MCV: 82.1 fL (ref 78.0–100.0)
Monocytes Absolute: 0.5 10*3/uL (ref 0.1–1.0)
Monocytes Relative: 6 % (ref 3–12)
Neutro Abs: 7 10*3/uL (ref 1.7–7.7)
Neutrophils Relative %: 75 % (ref 43–77)
Platelets: 296 10*3/uL (ref 150–400)
RBC: 2.46 MIL/uL — ABNORMAL LOW (ref 4.22–5.81)
RDW: 14.3 % (ref 11.5–15.5)
WBC: 9.4 10*3/uL (ref 4.0–10.5)

## 2013-02-04 MED ORDER — SODIUM POLYSTYRENE SULFONATE 15 GM/60ML PO SUSP
15.0000 g | Freq: Once | ORAL | Status: AC
  Administered 2013-02-05: 15 g via ORAL
  Filled 2013-02-04: qty 60

## 2013-02-04 MED ORDER — VANCOMYCIN HCL IN DEXTROSE 1-5 GM/200ML-% IV SOLN
1000.0000 mg | INTRAVENOUS | Status: DC
  Administered 2013-02-05: 1000 mg via INTRAVENOUS
  Filled 2013-02-04: qty 200

## 2013-02-04 MED ORDER — PIPERACILLIN-TAZOBACTAM 3.375 G IVPB 30 MIN
3.3750 g | Freq: Once | INTRAVENOUS | Status: AC
  Administered 2013-02-05: 3.375 g via INTRAVENOUS
  Filled 2013-02-04: qty 50

## 2013-02-04 NOTE — ED Notes (Signed)
PT. REPORTS PRODUCTIVE COUGH WITH LOW ABDOMINAL PAIN /LOWER LEGS PAIN / GENERALIZED WEAKNESS FOR SEVERAL DAYS , BROTHER TRANSLATING ( [place]) FOR PT.

## 2013-02-04 NOTE — ED Provider Notes (Signed)
History     CSN: [id number]  Arrival date & time 02/04/13  2050   First MD Initiated Contact with Patient 02/04/13 2335      Chief Complaint  Patient presents with  . Cough  . Abdominal Pain    (Consider location/radiation/quality/duration/timing/severity/associated sxs/prior treatment) The history is provided by the patient and a relative. The history is limited by a language barrier. A language interpreter was used.  Bren Pyatt is a 47 y.o. male hx of HTN, schizophrenia, anemia, renal failure here with ab pain, weakness, cough. For the last several days he has some productive cough with some blood tinged sputum. Oral he also has some diffuse abdominal pain with blood in the stool. Today he was found by his brother just diffusely weak with leg swelling. As per brother he has history of kidney disease and has been followed up at [facility] and has not seen any nephrologist here. He has never been on dialysis.   Translation by brother   Past Medical History  Diagnosis Date  . Hypertension   . Dyslipidemia   . Anemia   . Cellulitis   . Atrial fibrillation   . Enterobacter sepsis   . Schizophrenia     Past Surgical History  Procedure Laterality Date  . Cardiac defibrillator placement    . Pacemaker insertion      No family history on file.  History  Substance Use Topics  . Smoking status: Former Smoker    Quit date: 10/28/2008  . Smokeless tobacco: Never Used  . Alcohol Use: No      Review of Systems  Respiratory: Positive for cough.   Gastrointestinal: Positive for abdominal pain and blood in stool.  All other systems reviewed and are negative.    Allergies  Review of patient's allergies indicates no known allergies.  Home Medications   Current Outpatient Rx  Name  Route  Sig  Dispense  Refill  . gabapentin (NEURONTIN) 300 MG capsule   Oral   Take 300 mg by mouth 2 (two) times daily.         Marland Kitchen isoniazid (NYDRAZID) 300 MG tablet   Oral   Take  300 mg by mouth daily.         . naproxen (NAPROSYN) 500 MG tablet   Oral   Take 500 mg by mouth 2 (two) times daily as needed (for pain).          Marland Kitchen omeprazole (PRILOSEC) 20 MG capsule   Oral   Take 20 mg by mouth daily.         Marland Kitchen pyridOXINE (B-6) 50 MG tablet   Oral   Take 1 tablet (50 mg total) by mouth daily.   30 tablet   8   . risperiDONE (RISPERDAL) 0.5 MG tablet   Oral   Take 0.5 mg by mouth daily.          . traMADol (ULTRAM) 50 MG tablet   Oral   Take 50 mg by mouth every 6 (six) hours as needed for pain.           BP 135/74  Pulse 101  Temp(Src) 100.8 F (38.2 C) (Oral)  Resp 16  SpO2 98%  Physical Exam  Nursing note and vitals reviewed. Constitutional: He is oriented to person, place, and time.  Chronically ill,   HENT:  Head: Normocephalic.  MM dry   Eyes: Conjunctivae are normal. Pupils are equal, round, and reactive to light.  Neck: Normal range of motion.  Neck supple.  Cardiovascular: Regular rhythm and normal heart sounds.   tachy  Pulmonary/Chest: Effort normal. No respiratory distress.  + crackles bilateral bases   Abdominal: He exhibits no distension.  Firm, + BS, soft, nontender   Musculoskeletal: Normal range of motion.  2+ edema bilaterally   Neurological: He is alert and oriented to person, place, and time.  Skin: Skin is warm and dry.  Psychiatric: He has a normal mood and affect. His behavior is normal. Judgment and thought content normal.    ED Course  Procedures (including critical care time)  CRITICAL CARE Performed by: Darl Householder, Eldoris Beiser   Total critical care time:  30 min   Critical care time was exclusive of separately billable procedures and treating other patients.  Critical care was necessary to treat or prevent imminent or life-threatening deterioration.  Critical care was time spent personally by me on the following activities: development of treatment plan with patient and/or surrogate as well as nursing,  discussions with consultants, evaluation of patient's response to treatment, examination of patient, obtaining history from patient or surrogate, ordering and performing treatments and interventions, ordering and review of laboratory studies, ordering and review of radiographic studies, pulse oximetry and re-evaluation of patient's condition.   Labs Reviewed  CBC WITH DIFFERENTIAL - Abnormal; Notable for the following:    RBC 2.46 (*)    Hemoglobin 7.0 (*)    HCT 20.2 (*)    All other components within normal limits  COMPREHENSIVE METABOLIC PANEL - Abnormal; Notable for the following:    Sodium 133 (*)    Potassium 5.7 (*)    CO2 15 (*)    Glucose, Bld 124 (*)    BUN 91 (*)    Creatinine, Ser 10.25 (*)    Albumin 2.8 (*)    GFR calc non Af Amer 5 (*)    GFR calc Af Amer 6 (*)    All other components within normal limits  OCCULT BLOOD, POC DEVICE - Abnormal; Notable for the following:    Fecal Occult Bld POSITIVE (*)    All other components within normal limits  CULTURE, BLOOD (ROUTINE X 2)  CULTURE, BLOOD (ROUTINE X 2)  URINE CULTURE  LACTIC ACID, PLASMA  URINALYSIS, ROUTINE W REFLEX MICROSCOPIC  TYPE AND SCREEN  PREPARE RBC (CROSSMATCH)  ABO/RH   Dg Chest 2 View  02/04/2013  *RADIOLOGY REPORT*  Clinical Data: Fever.  Hemoptysis.  CHEST - 2 VIEW  Comparison: Two-view chest 05/03/2012.  Findings: The heart size is exaggerated by low lung volumes.  A single lead AICD is stable.  Patchy interstitial and airspace disease is present bilaterally.  The visualized soft tissues and bony thorax are unremarkable.  IMPRESSION:  1.  New patchy interstitial airspace disease.  Differential diagnosis includes edema, hemorrhage, or infection. 2.  Low lung volumes.   Original Report Authenticated By: San Morelle, M.D.      No diagnosis found.   Date: 02/05/2013  Rate: 92  Rhythm: normal sinus rhythm  QRS Axis: normal  Intervals: normal  ST/T Wave abnormalities: nonspecific ST  changes  Conduction Disutrbances:right bundle branch block  Narrative Interpretation:   Old EKG Reviewed: none available    MDM  Martell Lindell is a 47 y.o. male here with vomiting blood, rectal bleeding, fever. Patient anemic, occ positive. Will treat as GI bleed although it can be from renal causes. Will transfuse. CXR showed possible pneumonia.    12 AM Cr 10, baseline 1 a year ago. K 5.7, give  Kayexelate. No EKG changes. CXR showed possible failure vs infection. I called Dr. Justin Mend, nephrology, who will see patient in AM.   1:24 AM Culture sent, treated for pneumonia with van/zosyn. Anemic to 7, occ+ , likely GI bleed. Given protonix, transfusion ordered. I called Dr. Hal Hope, who recommend getting ABG and CT chest/ab/pel. Will admit to step down.        Wandra Arthurs, MD 02/05/13 [phone]

## 2013-02-05 ENCOUNTER — Encounter (HOSPITAL_COMMUNITY): Payer: Self-pay | Admitting: Internal Medicine

## 2013-02-05 ENCOUNTER — Inpatient Hospital Stay (HOSPITAL_COMMUNITY): Payer: Medicaid Other

## 2013-02-05 DIAGNOSIS — M3130 Wegener's granulomatosis without renal involvement: Secondary | ICD-10-CM

## 2013-02-05 DIAGNOSIS — D631 Anemia in chronic kidney disease: Secondary | ICD-10-CM | POA: Diagnosis present

## 2013-02-05 DIAGNOSIS — E872 Acidosis, unspecified: Secondary | ICD-10-CM | POA: Diagnosis present

## 2013-02-05 DIAGNOSIS — N179 Acute kidney failure, unspecified: Secondary | ICD-10-CM | POA: Diagnosis present

## 2013-02-05 DIAGNOSIS — D649 Anemia, unspecified: Secondary | ICD-10-CM

## 2013-02-05 DIAGNOSIS — K922 Gastrointestinal hemorrhage, unspecified: Secondary | ICD-10-CM | POA: Diagnosis present

## 2013-02-05 DIAGNOSIS — Z95810 Presence of automatic (implantable) cardiac defibrillator: Secondary | ICD-10-CM

## 2013-02-05 DIAGNOSIS — J189 Pneumonia, unspecified organism: Secondary | ICD-10-CM | POA: Diagnosis present

## 2013-02-05 HISTORY — DX: Wegener's granulomatosis without renal involvement: M31.30

## 2013-02-05 LAB — URINALYSIS, ROUTINE W REFLEX MICROSCOPIC
Bilirubin Urine: NEGATIVE
Glucose, UA: NEGATIVE mg/dL
Ketones, ur: NEGATIVE mg/dL
Nitrite: NEGATIVE
Protein, ur: 100 mg/dL — AB
Specific Gravity, Urine: 1.01 (ref 1.005–1.030)
Urobilinogen, UA: 0.2 mg/dL (ref 0.0–1.0)
pH: 5 (ref 5.0–8.0)

## 2013-02-05 LAB — COMPREHENSIVE METABOLIC PANEL
ALT: <5 U/L (ref 0–53)
AST: 15 U/L (ref 0–37)
Albumin: 2.5 g/dL — ABNORMAL LOW (ref 3.5–5.2)
Alkaline Phosphatase: 48 U/L (ref 39–117)
BUN: 97 mg/dL — ABNORMAL HIGH (ref 6–23)
CO2: 15 mEq/L — ABNORMAL LOW (ref 19–32)
Calcium: 8.8 mg/dL (ref 8.4–10.5)
Chloride: 104 mEq/L (ref 96–112)
Creatinine, Ser: 10.71 mg/dL — ABNORMAL HIGH (ref 0.50–1.35)
GFR calc Af Amer: 6 mL/min — ABNORMAL LOW (ref >90)
GFR calc non Af Amer: 5 mL/min — ABNORMAL LOW (ref >90)
Glucose, Bld: 73 mg/dL (ref 70–99)
Potassium: 4.9 mEq/L (ref 3.5–5.1)
Sodium: 134 mEq/L — ABNORMAL LOW (ref 135–145)
Total Bilirubin: 0.8 mg/dL (ref 0.3–1.2)
Total Protein: 6.6 g/dL (ref 6.0–8.3)

## 2013-02-05 LAB — IRON AND TIBC
Iron: 82 ug/dL (ref 42–135)
Saturation Ratios: 51 % (ref 20–55)
TIBC: 162 ug/dL — ABNORMAL LOW (ref 215–435)
UIBC: 80 ug/dL — ABNORMAL LOW (ref 125–400)

## 2013-02-05 LAB — CBC WITH DIFFERENTIAL/PLATELET
Basophils Absolute: 0 10*3/uL (ref 0.0–0.1)
Basophils Relative: 0 % (ref 0–1)
Eosinophils Absolute: 0.4 10*3/uL (ref 0.0–0.7)
Eosinophils Relative: 5 % (ref 0–5)
HCT: 19.5 % — ABNORMAL LOW (ref 39.0–52.0)
Hemoglobin: 6.9 g/dL — CL (ref 13.0–17.0)
Lymphocytes Relative: 10 % — ABNORMAL LOW (ref 12–46)
Lymphs Abs: 0.8 10*3/uL (ref 0.7–4.0)
MCH: 28.4 pg (ref 26.0–34.0)
MCHC: 35.4 g/dL (ref 30.0–36.0)
MCV: 80.2 fL (ref 78.0–100.0)
Monocytes Absolute: 0.3 10*3/uL (ref 0.1–1.0)
Monocytes Relative: 4 % (ref 3–12)
Neutro Abs: 6.4 10*3/uL (ref 1.7–7.7)
Neutrophils Relative %: 81 % — ABNORMAL HIGH (ref 43–77)
Platelets: 256 10*3/uL (ref 150–400)
RBC: 2.43 MIL/uL — ABNORMAL LOW (ref 4.22–5.81)
RDW: 15 % (ref 11.5–15.5)
WBC: 7.9 10*3/uL (ref 4.0–10.5)

## 2013-02-05 LAB — POCT I-STAT 3, ART BLOOD GAS (G3+)
Acid-base deficit: 12 mmol/L — ABNORMAL HIGH (ref 0.0–2.0)
Bicarbonate: 13.9 mEq/L — ABNORMAL LOW (ref 20.0–24.0)
O2 Saturation: 95 %
Patient temperature: 98.6
TCO2: 15 mmol/L (ref 0–100)
pCO2 arterial: 29 mmHg — ABNORMAL LOW (ref 35.0–45.0)
pH, Arterial: 7.289 — ABNORMAL LOW (ref 7.350–7.450)
pO2, Arterial: 81 mmHg (ref 80.0–100.0)

## 2013-02-05 LAB — GLUCOSE, CAPILLARY
Glucose-Capillary: 101 mg/dL — ABNORMAL HIGH (ref 70–99)
Glucose-Capillary: 93 mg/dL (ref 70–99)
Glucose-Capillary: 124 mg/dL — ABNORMAL HIGH (ref 70–99)
Glucose-Capillary: 136 mg/dL — ABNORMAL HIGH (ref 70–99)

## 2013-02-05 LAB — PREPARE RBC (CROSSMATCH)

## 2013-02-05 LAB — URINE MICROSCOPIC-ADD ON

## 2013-02-05 LAB — PROTIME-INR
INR: 1.3 (ref 0.00–1.49)
Prothrombin Time: 15.9 seconds — ABNORMAL HIGH (ref 11.6–15.2)

## 2013-02-05 LAB — LACTIC ACID, PLASMA: Lactic Acid, Venous: 0.7 mmol/L (ref 0.5–2.2)

## 2013-02-05 LAB — PROTEIN / CREATININE RATIO, URINE
Creatinine, Urine: 53.22 mg/dL
Protein Creatinine Ratio: 1 — ABNORMAL HIGH (ref 0.00–0.15)
Total Protein, Urine: 53.3 mg/dL

## 2013-02-05 LAB — ABO/RH: ABO/RH(D): A POS

## 2013-02-05 LAB — APTT: aPTT: 34 seconds (ref 24–37)

## 2013-02-05 LAB — MRSA PCR SCREENING: MRSA by PCR: NEGATIVE

## 2013-02-05 MED ORDER — VITAMIN B-6 50 MG PO TABS: 50.0000 mg | ORAL_TABLET | Freq: Every day | ORAL | Status: DC

## 2013-02-05 MED ORDER — MIDAZOLAM HCL 2 MG/2ML IJ SOLN
INTRAMUSCULAR | Status: AC | PRN
  Administered 2013-02-05: 1 mg via INTRAVENOUS

## 2013-02-05 MED ORDER — SODIUM CHLORIDE 0.9 % IV SOLN
1.0000 g | Freq: Once | INTRAVENOUS | Status: AC
  Administered 2013-02-05: 1 g via INTRAVENOUS
  Filled 2013-02-05: qty 10

## 2013-02-05 MED ORDER — INSULIN ASPART 100 UNIT/ML ~~LOC~~ SOLN
10.0000 [IU] | Freq: Once | SUBCUTANEOUS | Status: AC
  Administered 2013-02-05: 10 [IU] via INTRAVENOUS

## 2013-02-05 MED ORDER — SODIUM CHLORIDE 0.9 % IV SOLN
500.0000 mg | Freq: Once | INTRAVENOUS | Status: AC
  Administered 2013-02-06: 500 mg via INTRAVENOUS
  Filled 2013-02-05: qty 25

## 2013-02-05 MED ORDER — SODIUM POLYSTYRENE SULFONATE 15 GM/60ML PO SUSP
15.0000 g | Freq: Once | ORAL | Status: AC
  Administered 2013-02-05: 15 g via ORAL
  Filled 2013-02-05: qty 60

## 2013-02-05 MED ORDER — MIDAZOLAM HCL 2 MG/2ML IJ SOLN
INTRAMUSCULAR | Status: AC
  Filled 2013-02-05: qty 4

## 2013-02-05 MED ORDER — PANTOPRAZOLE SODIUM 40 MG IV SOLR
40.0000 mg | Freq: Every day | INTRAVENOUS | Status: DC
  Administered 2013-02-05 – 2013-02-06 (×3): 40 mg via INTRAVENOUS
  Filled 2013-02-05 (×4): qty 40

## 2013-02-05 MED ORDER — SODIUM CHLORIDE 0.9 % IV SOLN
80.0000 mg | Freq: Once | INTRAVENOUS | Status: AC
  Administered 2013-02-05: 80 mg via INTRAVENOUS
  Filled 2013-02-05: qty 80

## 2013-02-05 MED ORDER — SODIUM CHLORIDE 0.9 % IV SOLN: INTRAVENOUS | Status: DC

## 2013-02-05 MED ORDER — RISPERIDONE 0.5 MG PO TABS
0.5000 mg | ORAL_TABLET | Freq: Every day | ORAL | Status: DC
  Administered 2013-02-05 – 2013-02-11 (×7): 0.5 mg via ORAL
  Filled 2013-02-05 (×7): qty 1

## 2013-02-05 MED ORDER — DEXTROSE 5 % IV SOLN
500.0000 mg | INTRAVENOUS | Status: DC
  Administered 2013-02-05: 500 mg via INTRAVENOUS
  Filled 2013-02-05: qty 0.5

## 2013-02-05 MED ORDER — IOHEXOL 300 MG/ML  SOLN: 50.0000 mL | Freq: Once | INTRAMUSCULAR | Status: DC | PRN

## 2013-02-05 MED ORDER — SODIUM CHLORIDE 0.9 % IV SOLN
INTRAVENOUS | Status: DC
  Administered 2013-02-10: 10:00:00 via INTRAVENOUS

## 2013-02-05 MED ORDER — HEPARIN SODIUM (PORCINE) 1000 UNIT/ML IJ SOLN
INTRAMUSCULAR | Status: AC
  Filled 2013-02-05: qty 1

## 2013-02-05 MED ORDER — ACETAMINOPHEN 650 MG RE SUPP: 650.0000 mg | Freq: Four times a day (QID) | RECTAL | Status: DC | PRN

## 2013-02-05 MED ORDER — VITAMIN B-6 50 MG PO TABS
50.0000 mg | ORAL_TABLET | Freq: Every day | ORAL | Status: DC
  Administered 2013-02-05 – 2013-02-11 (×7): 50 mg via ORAL
  Filled 2013-02-05 (×7): qty 1

## 2013-02-05 MED ORDER — ONDANSETRON HCL 4 MG/2ML IJ SOLN: 4.0000 mg | Freq: Four times a day (QID) | INTRAMUSCULAR | Status: DC | PRN

## 2013-02-05 MED ORDER — CEFAZOLIN SODIUM 1-5 GM-% IV SOLN
1.0000 g | Freq: Once | INTRAVENOUS | Status: AC
  Filled 2013-02-05: qty 50

## 2013-02-05 MED ORDER — FENTANYL CITRATE 0.05 MG/ML IJ SOLN
INTRAMUSCULAR | Status: AC
  Filled 2013-02-05: qty 4

## 2013-02-05 MED ORDER — METHYLPREDNISOLONE SODIUM SUCC 125 MG IJ SOLR
INTRAMUSCULAR | Status: AC
  Administered 2013-02-06: 250 mg via INTRAVENOUS
  Filled 2013-02-05: qty 2

## 2013-02-05 MED ORDER — VANCOMYCIN HCL IN DEXTROSE 1-5 GM/200ML-% IV SOLN
1000.0000 mg | Freq: Once | INTRAVENOUS | Status: AC
  Administered 2013-02-05: 1000 mg via INTRAVENOUS
  Filled 2013-02-05: qty 200

## 2013-02-05 MED ORDER — FENTANYL CITRATE 0.05 MG/ML IJ SOLN
INTRAMUSCULAR | Status: AC | PRN
  Administered 2013-02-05: 25 ug via INTRAVENOUS
  Administered 2013-02-05: 50 ug via INTRAVENOUS

## 2013-02-05 MED ORDER — SODIUM CHLORIDE 0.9 % IJ SOLN
3.0000 mL | Freq: Two times a day (BID) | INTRAMUSCULAR | Status: DC
  Administered 2013-02-05 – 2013-02-09 (×10): 3 mL via INTRAVENOUS

## 2013-02-05 MED ORDER — DEXTROSE 5 % IV SOLN
500.0000 mg | Freq: Once | INTRAVENOUS | Status: AC
  Administered 2013-02-05: 500 mg via INTRAVENOUS
  Filled 2013-02-05: qty 0.5

## 2013-02-05 MED ORDER — METHYLPREDNISOLONE SODIUM SUCC 125 MG IJ SOLR
250.0000 mg | Freq: Four times a day (QID) | INTRAMUSCULAR | Status: AC
  Administered 2013-02-05 – 2013-02-07 (×11): 250 mg via INTRAVENOUS
  Filled 2013-02-05 (×17): qty 4

## 2013-02-05 MED ORDER — ISONIAZID 300 MG PO TABS
300.0000 mg | ORAL_TABLET | Freq: Every day | ORAL | Status: DC
  Administered 2013-02-05 – 2013-02-11 (×7): 300 mg via ORAL
  Filled 2013-02-05 (×7): qty 1

## 2013-02-05 MED ORDER — CEFAZOLIN SODIUM 1-5 GM-% IV SOLN
INTRAVENOUS | Status: AC
  Administered 2013-02-05: 1000 mg via INTRAVENOUS
  Filled 2013-02-05: qty 50

## 2013-02-05 MED ORDER — ACETAMINOPHEN 500 MG PO TABS
1000.0000 mg | ORAL_TABLET | Freq: Once | ORAL | Status: AC
  Administered 2013-02-05: 1000 mg via ORAL
  Filled 2013-02-05: qty 2

## 2013-02-05 MED ORDER — SODIUM CHLORIDE 0.9 % IJ SOLN
3.0000 mL | Freq: Two times a day (BID) | INTRAMUSCULAR | Status: DC
  Administered 2013-02-05 – 2013-02-10 (×4): 3 mL via INTRAVENOUS

## 2013-02-05 MED ORDER — DEXTROSE 50 % IV SOLN
50.0000 mL | Freq: Once | INTRAVENOUS | Status: AC
  Administered 2013-02-05: 50 mL via INTRAVENOUS
  Filled 2013-02-05: qty 50

## 2013-02-05 MED ORDER — SODIUM CHLORIDE 0.9 % IV SOLN
INTRAVENOUS | Status: DC
  Administered 2013-02-05: via INTRAVENOUS

## 2013-02-05 MED ORDER — DEXTROSE 5 % IV SOLN: 1.0000 g | INTRAVENOUS | Status: DC

## 2013-02-05 MED ORDER — ACETAMINOPHEN 325 MG PO TABS
650.0000 mg | ORAL_TABLET | Freq: Four times a day (QID) | ORAL | Status: DC | PRN
  Administered 2013-02-09: 650 mg via ORAL
  Filled 2013-02-05: qty 2

## 2013-02-05 MED ORDER — SODIUM BICARBONATE 650 MG PO TABS
650.0000 mg | ORAL_TABLET | Freq: Two times a day (BID) | ORAL | Status: DC
  Administered 2013-02-05 – 2013-02-09 (×9): 650 mg via ORAL
  Filled 2013-02-05 (×10): qty 1

## 2013-02-05 MED ORDER — ONDANSETRON HCL 4 MG PO TABS: 4.0000 mg | ORAL_TABLET | Freq: Four times a day (QID) | ORAL | Status: DC | PRN

## 2013-02-05 NOTE — Progress Notes (Signed)
Utilization Review Completed.Donne Anon T4/08/2013

## 2013-02-05 NOTE — Progress Notes (Addendum)
[facility] PROGRESS NOTE  James Hai MD:[id number] DOB: 07/03/1966 DOA: 02/04/2013 PCP: [name], MD  Assessment/Plan: 1. Wegener granulomatosis with epistaxis, alveolar hemorrhage and renal failure- fulminant - plan for HD, intubation mechanical ventilation if needed, steroids iv and pulse dose cyclophosphamide 500 mg iv once today after HD with the main goal to help contain the alveolar hemorrhage.  2. Latent TB - continue isoniazide and B 6 3. On empiric anitbiotic for now until cultures are back 4. Acute blood loss anemia due to alveolar hemorrhage - getting transfused 1 unit of prbcs 02/05/13    Code Status: full code  Family Communication: brother  Disposition Plan: ?   Consultants:  Nephrology   Pulmonary   Procedures:  Hd catheter 02/05/13   Antibiotics: Vanc and Zosyn   HPI/Subjective: Coughing, having dyspnea, hemoptysis, epistaxis   Objective: Filed Vitals:   02/05/13 1300 02/05/13 1315 02/05/13 1330 02/05/13 1343  BP: 123/66 119/68 120/62 116/60  Pulse:      Temp:  98.3 F (36.8 C)  98.8 F (37.1 C)  TempSrc:  Oral  Oral  Resp: 16 14 17 21   Height:      Weight:      SpO2:        Intake/Output Summary (Last 24 hours) at 02/05/13 1430 Last data filed at 02/05/13 1300  Gross per 24 hour  Intake 1408.83 ml  Output      0 ml  Net 1408.83 ml   Filed Weights   02/05/13 0235  Weight: 71.6 kg (157 lb 13.6 oz)    Exam:   General:  Alert   Cardiovascular: regular  Respiratory: bilat crackles  Abdomen: soft, nt   Musculoskeletal: no edema     Data Reviewed: Basic Metabolic Panel:  Recent Labs Lab 02/04/13 2110 02/05/13 0710  NA 133* 134*  K 5.7* 4.9  CL 103 104  CO2 15* 15*  GLUCOSE 124* 73  BUN 91* 97*  CREATININE 10.25* 10.71*  CALCIUM 8.8 8.8   Liver Function Tests:  Recent Labs Lab 02/04/13 2110 02/05/13 0710  AST 16 15  ALT <5 <5  ALKPHOS 52 48  BILITOT 0.5 0.8  PROT 7.1 6.6  ALBUMIN 2.8*  2.5*   No results found for this basename: LIPASE, AMYLASE,  in the last 168 hours No results found for this basename: AMMONIA,  in the last 168 hours CBC:  Recent Labs Lab 02/04/13 2110 02/05/13 0710  WBC 9.4 7.9  NEUTROABS 7.0 6.4  HGB 7.0* 6.9*  HCT 20.2* 19.5*  MCV 82.1 80.2  PLT 296 256   Cardiac Enzymes: No results found for this basename: CKTOTAL, CKMB, CKMBINDEX, TROPONINI,  in the last 168 hours BNP (last 3 results) No results found for this basename: PROBNP,  in the last 8760 hours CBG:  Recent Labs Lab 02/05/13 0422 02/05/13 0800 02/05/13 1243  GLUCAP 101* 93 124*    Recent Results (from the past 240 hour(s))  MRSA PCR SCREENING     Status: None   Collection Time    02/05/13  2:32 AM      Result Value Range Status   MRSA by PCR NEGATIVE  NEGATIVE Final   Comment:            The GeneXpert MRSA Assay (FDA     approved for NASAL specimens     only), is one component of a     comprehensive MRSA colonization     surveillance program. It is not  intended to diagnose MRSA     infection nor to guide or     monitor treatment for     MRSA infections.     Studies: Ct Abdomen Pelvis Wo Contrast  02/05/2013  *RADIOLOGY REPORT*  Clinical Data:  Productive cough, lower abdominal pain and lower leg pain; generalized weakness.  CT CHEST, ABDOMEN AND PELVIS WITHOUT CONTRAST  Technique:  Multidetector CT imaging of the chest, abdomen and pelvis was performed following the standard protocol without IV contrast.  Comparison:  Chest radiograph performed 02/04/2013  CT CHEST  Findings:  There is diffuse opacification involving both lungs, relatively fluffy in appearance, with peripheral sparing.  This may reflect diffuse pneumonia or possibly pulmonary alveolar hemorrhage.  The appearance is somewhat less typical for pulmonary edema.  No pleural effusion or pneumothorax is seen.  No definite masses are identified.  The heart is borderline enlarged.  An AICD lead is noted  ending at the right ventricle.  No mediastinal lymphadenopathy is seen.  No pericardial effusion is identified.  The great vessels are grossly unremarkable in appearance.  The thyroid gland is grossly unremarkable in appearance.  No axillary lymphadenopathy is seen.  No acute osseous abnormalities are identified.  IMPRESSION:  1.  Diffuse opacification involving both lungs, with peripheral sparing.  This may reflect diffuse pneumonia or possibly pulmonary alveolar hemorrhage.  The appearance is somewhat less typical for pulmonary edema, though it remains a possibility. 2.  Borderline cardiomegaly.  CT ABDOMEN AND PELVIS  Findings:  The liver and spleen are unremarkable in appearance. The gallbladder is within normal limits.  The pancreas is somewhat prominent, though this likely remains within normal limits.  The adrenal glands are unremarkable in appearance.  The kidneys are unremarkable in appearance.  There is no evidence of hydronephrosis.  No renal or ureteral stones are seen.  No perinephric stranding is appreciated.  No free fluid is identified.  The small bowel is unremarkable in appearance.  The stomach is within normal limits.  No acute vascular abnormalities are seen.  Mildly prominent periaortic nodes are seen, of uncertain significance.  The appendix appears grossly normal in caliber, without evidence for appendicitis.  Contrast progresses to the level of the rectum. The colon is unremarkable in appearance.  The bladder is moderately distended and grossly unremarkable in appearance; a small urachal remnant is incidentally noted.  The prostate remains normal in size.  No inguinal lymphadenopathy is seen.  No acute osseous abnormalities are identified.  IMPRESSION:  1.  No acute abnormality seen within the abdomen or pelvis. 2.  Mildly prominent periaortic nodes, of uncertain significance.   Original Report Authenticated By: Santa Lighter, M.D.    Dg Chest 2 View  02/04/2013  *RADIOLOGY REPORT*   Clinical Data: Fever.  Hemoptysis.  CHEST - 2 VIEW  Comparison: Two-view chest 05/03/2012.  Findings: The heart size is exaggerated by low lung volumes.  A single lead AICD is stable.  Patchy interstitial and airspace disease is present bilaterally.  The visualized soft tissues and bony thorax are unremarkable.  IMPRESSION:  1.  New patchy interstitial airspace disease.  Differential diagnosis includes edema, hemorrhage, or infection. 2.  Low lung volumes.   Original Report Authenticated By: San Morelle, M.D.    Ct Chest Wo Contrast  02/05/2013  *RADIOLOGY REPORT*  Clinical Data:  Productive cough, lower abdominal pain and lower leg pain; generalized weakness.  CT CHEST, ABDOMEN AND PELVIS WITHOUT CONTRAST  Technique:  Multidetector CT imaging of the chest, abdomen  and pelvis was performed following the standard protocol without IV contrast.  Comparison:  Chest radiograph performed 02/04/2013  CT CHEST  Findings:  There is diffuse opacification involving both lungs, relatively fluffy in appearance, with peripheral sparing.  This may reflect diffuse pneumonia or possibly pulmonary alveolar hemorrhage.  The appearance is somewhat less typical for pulmonary edema.  No pleural effusion or pneumothorax is seen.  No definite masses are identified.  The heart is borderline enlarged.  An AICD lead is noted ending at the right ventricle.  No mediastinal lymphadenopathy is seen.  No pericardial effusion is identified.  The great vessels are grossly unremarkable in appearance.  The thyroid gland is grossly unremarkable in appearance.  No axillary lymphadenopathy is seen.  No acute osseous abnormalities are identified.  IMPRESSION:  1.  Diffuse opacification involving both lungs, with peripheral sparing.  This may reflect diffuse pneumonia or possibly pulmonary alveolar hemorrhage.  The appearance is somewhat less typical for pulmonary edema, though it remains a possibility. 2.  Borderline cardiomegaly.  CT ABDOMEN  AND PELVIS  Findings:  The liver and spleen are unremarkable in appearance. The gallbladder is within normal limits.  The pancreas is somewhat prominent, though this likely remains within normal limits.  The adrenal glands are unremarkable in appearance.  The kidneys are unremarkable in appearance.  There is no evidence of hydronephrosis.  No renal or ureteral stones are seen.  No perinephric stranding is appreciated.  No free fluid is identified.  The small bowel is unremarkable in appearance.  The stomach is within normal limits.  No acute vascular abnormalities are seen.  Mildly prominent periaortic nodes are seen, of uncertain significance.  The appendix appears grossly normal in caliber, without evidence for appendicitis.  Contrast progresses to the level of the rectum. The colon is unremarkable in appearance.  The bladder is moderately distended and grossly unremarkable in appearance; a small urachal remnant is incidentally noted.  The prostate remains normal in size.  No inguinal lymphadenopathy is seen.  No acute osseous abnormalities are identified.  IMPRESSION:  1.  No acute abnormality seen within the abdomen or pelvis. 2.  Mildly prominent periaortic nodes, of uncertain significance.   Original Report Authenticated By: Santa Lighter, M.D.     Scheduled Meds: . [START ON 02/06/2013] ceFEPime (MAXIPIME) IV  1 g Intravenous Q24H  . ceFEPime (MAXIPIME) IV  500 mg Intravenous Once  . cyclophosphamide  500 mg Intravenous Once  . isoniazid  300 mg Oral Daily  . methylPREDNISolone (SOLU-MEDROL) injection  250 mg Intravenous Q6H  . pantoprazole (PROTONIX) IV  40 mg Intravenous QHS  . vitamin B-6  50 mg Oral Daily  . risperiDONE  0.5 mg Oral Daily  . sodium bicarbonate  650 mg Oral BID  . sodium chloride  3 mL Intravenous Q12H  . sodium chloride  3 mL Intravenous Q12H  . vancomycin  1,000 mg Intravenous Once   Continuous Infusions: . sodium chloride 10 mL/hr at 02/05/13 [id number]    Principal  Problem:   ARF (acute renal failure) Active Problems:   Other primary cardiomyopathies   PPD positive   Anemia   Pneumonia   GI bleed   Metabolic acidosis      James Nicholson  [facility] Pager [phone]. If 7PM-7AM, please contact night-coverage at [email or web address], password [facility] 02/05/2013, 2:30 PM  LOS: 1 day

## 2013-02-05 NOTE — Progress Notes (Signed)
Patient ID: James Nicholson, male   DOB: 1966-06-29, 47 y.o.   MRN: [id number] Request received for placement of a tunneled HD cath in pt with hx of Wegener's granulomatosis admitted with weakness, anemia and ARF. Additional PMH as below. Exam: pt awake/alert; brother at bedside providing translation. chest- few bilateral crackles; heart- RRR; abd- soft,+BS,NT; ext- FROM,tr edema.   Pt coughing and has had nosebleed this am. Hgb currently 6.9 Filed Vitals:   02/05/13 0600 02/05/13 0630 02/05/13 0700 02/05/13 0800  BP: 107/52 99/50 95/56  115/71  Pulse: 79 77 72 81  Temp:    97.4 F (36.3 C)  TempSrc:    Oral  Resp: 13 25 21 16   Height:      Weight:      SpO2: 95% 96% 96% 97%   Past Medical History  Diagnosis Date  . Hypertension   . Dyslipidemia   . Anemia   . Cellulitis   . Atrial fibrillation   . Enterobacter sepsis   . Schizophrenia    Past Surgical History  Procedure Laterality Date  . Cardiac defibrillator placement    . Pacemaker insertion    Ct Abdomen Pelvis Wo Contrast  02/05/2013  *RADIOLOGY REPORT*  Clinical Data:  Productive cough, lower abdominal pain and lower leg pain; generalized weakness.  CT CHEST, ABDOMEN AND PELVIS WITHOUT CONTRAST  Technique:  Multidetector CT imaging of the chest, abdomen and pelvis was performed following the standard protocol without IV contrast.  Comparison:  Chest radiograph performed 02/04/2013  CT CHEST  Findings:  There is diffuse opacification involving both lungs, relatively fluffy in appearance, with peripheral sparing.  This may reflect diffuse pneumonia or possibly pulmonary alveolar hemorrhage.  The appearance is somewhat less typical for pulmonary edema.  No pleural effusion or pneumothorax is seen.  No definite masses are identified.  The heart is borderline enlarged.  An AICD lead is noted ending at the right ventricle.  No mediastinal lymphadenopathy is seen.  No pericardial effusion is identified.  The great vessels are grossly  unremarkable in appearance.  The thyroid gland is grossly unremarkable in appearance.  No axillary lymphadenopathy is seen.  No acute osseous abnormalities are identified.  IMPRESSION:  1.  Diffuse opacification involving both lungs, with peripheral sparing.  This may reflect diffuse pneumonia or possibly pulmonary alveolar hemorrhage.  The appearance is somewhat less typical for pulmonary edema, though it remains a possibility. 2.  Borderline cardiomegaly.  CT ABDOMEN AND PELVIS  Findings:  The liver and spleen are unremarkable in appearance. The gallbladder is within normal limits.  The pancreas is somewhat prominent, though this likely remains within normal limits.  The adrenal glands are unremarkable in appearance.  The kidneys are unremarkable in appearance.  There is no evidence of hydronephrosis.  No renal or ureteral stones are seen.  No perinephric stranding is appreciated.  No free fluid is identified.  The small bowel is unremarkable in appearance.  The stomach is within normal limits.  No acute vascular abnormalities are seen.  Mildly prominent periaortic nodes are seen, of uncertain significance.  The appendix appears grossly normal in caliber, without evidence for appendicitis.  Contrast progresses to the level of the rectum. The colon is unremarkable in appearance.  The bladder is moderately distended and grossly unremarkable in appearance; a small urachal remnant is incidentally noted.  The prostate remains normal in size.  No inguinal lymphadenopathy is seen.  No acute osseous abnormalities are identified.  IMPRESSION:  1.  No acute abnormality seen  within the abdomen or pelvis. 2.  Mildly prominent periaortic nodes, of uncertain significance.   Original Report Authenticated By: Santa Lighter, M.D.    Dg Chest 2 View  02/04/2013  *RADIOLOGY REPORT*  Clinical Data: Fever.  Hemoptysis.  CHEST - 2 VIEW  Comparison: Two-view chest 05/03/2012.  Findings: The heart size is exaggerated by low lung  volumes.  A single lead AICD is stable.  Patchy interstitial and airspace disease is present bilaterally.  The visualized soft tissues and bony thorax are unremarkable.  IMPRESSION:  1.  New patchy interstitial airspace disease.  Differential diagnosis includes edema, hemorrhage, or infection. 2.  Low lung volumes.   Original Report Authenticated By: San Morelle, M.D.    Ct Chest Wo Contrast  02/05/2013  *RADIOLOGY REPORT*  Clinical Data:  Productive cough, lower abdominal pain and lower leg pain; generalized weakness.  CT CHEST, ABDOMEN AND PELVIS WITHOUT CONTRAST  Technique:  Multidetector CT imaging of the chest, abdomen and pelvis was performed following the standard protocol without IV contrast.  Comparison:  Chest radiograph performed 02/04/2013  CT CHEST  Findings:  There is diffuse opacification involving both lungs, relatively fluffy in appearance, with peripheral sparing.  This may reflect diffuse pneumonia or possibly pulmonary alveolar hemorrhage.  The appearance is somewhat less typical for pulmonary edema.  No pleural effusion or pneumothorax is seen.  No definite masses are identified.  The heart is borderline enlarged.  An AICD lead is noted ending at the right ventricle.  No mediastinal lymphadenopathy is seen.  No pericardial effusion is identified.  The great vessels are grossly unremarkable in appearance.  The thyroid gland is grossly unremarkable in appearance.  No axillary lymphadenopathy is seen.  No acute osseous abnormalities are identified.  IMPRESSION:  1.  Diffuse opacification involving both lungs, with peripheral sparing.  This may reflect diffuse pneumonia or possibly pulmonary alveolar hemorrhage.  The appearance is somewhat less typical for pulmonary edema, though it remains a possibility. 2.  Borderline cardiomegaly.  CT ABDOMEN AND PELVIS  Findings:  The liver and spleen are unremarkable in appearance. The gallbladder is within normal limits.  The pancreas is somewhat  prominent, though this likely remains within normal limits.  The adrenal glands are unremarkable in appearance.  The kidneys are unremarkable in appearance.  There is no evidence of hydronephrosis.  No renal or ureteral stones are seen.  No perinephric stranding is appreciated.  No free fluid is identified.  The small bowel is unremarkable in appearance.  The stomach is within normal limits.  No acute vascular abnormalities are seen.  Mildly prominent periaortic nodes are seen, of uncertain significance.  The appendix appears grossly normal in caliber, without evidence for appendicitis.  Contrast progresses to the level of the rectum. The colon is unremarkable in appearance.  The bladder is moderately distended and grossly unremarkable in appearance; a small urachal remnant is incidentally noted.  The prostate remains normal in size.  No inguinal lymphadenopathy is seen.  No acute osseous abnormalities are identified.  IMPRESSION:  1.  No acute abnormality seen within the abdomen or pelvis. 2.  Mildly prominent periaortic nodes, of uncertain significance.   Original Report Authenticated By: Santa Lighter, M.D.   Results for orders placed during the hospital encounter of 02/04/13  MRSA PCR SCREENING      Result Value Range   MRSA by PCR NEGATIVE  NEGATIVE  CBC WITH DIFFERENTIAL      Result Value Range   WBC 9.4  4.0 -  10.5 K/uL   RBC 2.46 (*) 4.22 - 5.81 MIL/uL   Hemoglobin 7.0 (*) 13.0 - 17.0 g/dL   HCT 20.2 (*) 39.0 - 52.0 %   MCV 82.1  78.0 - 100.0 fL   MCH 28.5  26.0 - 34.0 pg   MCHC 34.7  30.0 - 36.0 g/dL   RDW 14.3  11.5 - 15.5 %   Platelets 296  150 - 400 K/uL   Neutrophils Relative 75  43 - 77 %   Neutro Abs 7.0  1.7 - 7.7 K/uL   Lymphocytes Relative 15  12 - 46 %   Lymphs Abs 1.4  0.7 - 4.0 K/uL   Monocytes Relative 6  3 - 12 %   Monocytes Absolute 0.5  0.1 - 1.0 K/uL   Eosinophils Relative 5  0 - 5 %   Eosinophils Absolute 0.5  0.0 - 0.7 K/uL   Basophils Relative 0  0 - 1 %    Basophils Absolute 0.0  0.0 - 0.1 K/uL  COMPREHENSIVE METABOLIC PANEL      Result Value Range   Sodium 133 (*) 135 - 145 mEq/L   Potassium 5.7 (*) 3.5 - 5.1 mEq/L   Chloride 103  96 - 112 mEq/L   CO2 15 (*) 19 - 32 mEq/L   Glucose, Bld 124 (*) 70 - 99 mg/dL   BUN 91 (*) 6 - 23 mg/dL   Creatinine, Ser 10.25 (*) 0.50 - 1.35 mg/dL   Calcium 8.8  8.4 - 10.5 mg/dL   Total Protein 7.1  6.0 - 8.3 g/dL   Albumin 2.8 (*) 3.5 - 5.2 g/dL   AST 16  0 - 37 U/L   ALT <5  0 - 53 U/L   Alkaline Phosphatase 52  39 - 117 U/L   Total Bilirubin 0.5  0.3 - 1.2 mg/dL   GFR calc non Af Amer 5 (*) >90 mL/min   GFR calc Af Amer 6 (*) >90 mL/min  LACTIC ACID, PLASMA      Result Value Range   Lactic Acid, Venous 0.7  0.5 - 2.2 mmol/L  URINALYSIS, ROUTINE W REFLEX MICROSCOPIC      Result Value Range   Color, Urine YELLOW  YELLOW   APPearance CLOUDY (*) CLEAR   Specific Gravity, Urine 1.010  1.005 - 1.030   pH 5.0  5.0 - 8.0   Glucose, UA NEGATIVE  NEGATIVE mg/dL   Hgb urine dipstick LARGE (*) NEGATIVE   Bilirubin Urine NEGATIVE  NEGATIVE   Ketones, ur NEGATIVE  NEGATIVE mg/dL   Protein, ur 100 (*) NEGATIVE mg/dL   Urobilinogen, UA 0.2  0.0 - 1.0 mg/dL   Nitrite NEGATIVE  NEGATIVE   Leukocytes, UA SMALL (*) NEGATIVE  URINE MICROSCOPIC-ADD ON      Result Value Range   Squamous Epithelial / LPF RARE  RARE   WBC, UA 3-6  <3 WBC/hpf   RBC / HPF 7-10  <3 RBC/hpf   Bacteria, UA RARE  RARE  COMPREHENSIVE METABOLIC PANEL      Result Value Range   Sodium 134 (*) 135 - 145 mEq/L   Potassium 4.9  3.5 - 5.1 mEq/L   Chloride 104  96 - 112 mEq/L   CO2 15 (*) 19 - 32 mEq/L   Glucose, Bld 73  70 - 99 mg/dL   BUN 97 (*) 6 - 23 mg/dL   Creatinine, Ser 10.71 (*) 0.50 - 1.35 mg/dL   Calcium 8.8  8.4 - 10.5 mg/dL  Total Protein 6.6  6.0 - 8.3 g/dL   Albumin 2.5 (*) 3.5 - 5.2 g/dL   AST 15  0 - 37 U/L   ALT <5  0 - 53 U/L   Alkaline Phosphatase 48  39 - 117 U/L   Total Bilirubin 0.8  0.3 - 1.2 mg/dL   GFR  calc non Af Amer 5 (*) >90 mL/min   GFR calc Af Amer 6 (*) >90 mL/min  CBC WITH DIFFERENTIAL      Result Value Range   WBC 7.9  4.0 - 10.5 K/uL   RBC 2.43 (*) 4.22 - 5.81 MIL/uL   Hemoglobin 6.9 (*) 13.0 - 17.0 g/dL   HCT 19.5 (*) 39.0 - 52.0 %   MCV 80.2  78.0 - 100.0 fL   MCH 28.4  26.0 - 34.0 pg   MCHC 35.4  30.0 - 36.0 g/dL   RDW 15.0  11.5 - 15.5 %   Platelets 256  150 - 400 K/uL   Neutrophils Relative 81 (*) 43 - 77 %   Lymphocytes Relative 10 (*) 12 - 46 %   Monocytes Relative 4  3 - 12 %   Eosinophils Relative 5  0 - 5 %   Basophils Relative 0  0 - 1 %   Neutro Abs 6.4  1.7 - 7.7 K/uL   Lymphs Abs 0.8  0.7 - 4.0 K/uL   Monocytes Absolute 0.3  0.1 - 1.0 K/uL   Eosinophils Absolute 0.4  0.0 - 0.7 K/uL   Basophils Absolute 0.0  0.0 - 0.1 K/uL  GLUCOSE, CAPILLARY      Result Value Range   Glucose-Capillary 101 (*) 70 - 99 mg/dL  GLUCOSE, CAPILLARY      Result Value Range   Glucose-Capillary 93  70 - 99 mg/dL  OCCULT BLOOD, POC DEVICE      Result Value Range   Fecal Occult Bld POSITIVE (*) NEGATIVE  POCT I-STAT 3, BLOOD GAS (G3+)      Result Value Range   pH, Arterial 7.289 (*) 7.350 - 7.450   pCO2 arterial 29.0 (*) 35.0 - 45.0 mmHg   pO2, Arterial 81.0  80.0 - 100.0 mmHg   Bicarbonate 13.9 (*) 20.0 - 24.0 mEq/L   TCO2 15  0 - 100 mmol/L   O2 Saturation 95.0     Acid-base deficit 12.0 (*) 0.0 - 2.0 mmol/L   Patient temperature 98.6 F     Collection site RADIAL, ALLEN'S TEST ACCEPTABLE     Drawn by Operator     Sample type ARTERIAL    TYPE AND SCREEN      Result Value Range   ABO/RH(D) A POS     Antibody Screen NEG     Sample Expiration 02/08/2013     Unit Number [id number]     Blood Component Type RED CELLS,LR     Unit division 00     Status of Unit ISSUED     Transfusion Status OK TO TRANSFUSE     Crossmatch Result Compatible    PREPARE RBC (CROSSMATCH)      Result Value Range   Order Confirmation ORDER PROCESSED BY BLOOD BANK    ABO/RH      Result  Value Range   ABO/RH(D) A POS     A/P: Pt with hx Wegener's granulomatosis admitted with weakness, anemia, low urine output and worsening renal failure. Plan is for placement of a tunneled HD catheter today. Details/risks of procedure d/w pt (via brother as  interpreter) with their understanding and consent. Primary team notified of Hgb of 6.9 today- recommend transfusion prior to HD cath placement.

## 2013-02-05 NOTE — ED Notes (Signed)
Respiratory contacted for abg.

## 2013-02-05 NOTE — Progress Notes (Signed)
ANTIBIOTIC CONSULT NOTE - Follow-up  Pharmacy Consult for vancomycin and cefepime Indication: rule out pneumonia  No Known Allergies  Patient Measurements: Weight: ~70kg  Vital Signs: Temp: 97.4 F (36.3 C) (04/11 0800) Temp src: Oral (04/11 0800) BP: 115/71 mmHg (04/11 0800) Pulse Rate: 81 (04/11 0800)  Labs:  Recent Labs  02/04/13 2110 02/05/13 0710  WBC 9.4 7.9  HGB 7.0* 6.9*  PLT 296 256  CREATININE 10.25* 10.71*    Microbiology: Recent Results (from the past 720 hour(s))  MRSA PCR SCREENING     Status: None   Collection Time    02/05/13  2:32 AM      Result Value Range Status   MRSA by PCR NEGATIVE  NEGATIVE Final   Comment:            The GeneXpert MRSA Assay (FDA     approved for NASAL specimens     only), is one component of a     comprehensive MRSA colonization     surveillance program. It is not     intended to diagnose MRSA     infection nor to guide or     monitor treatment for     MRSA infections.    Medical History: Past Medical History  Diagnosis Date  . Hypertension   . Dyslipidemia   . Anemia   . Cellulitis   . Atrial fibrillation   . Enterobacter sepsis   . Schizophrenia     Assessment: 47yo male c/o productive cough with low abdominal pain and generalized weakness, CXR shows possible infection, to begin IV ABX; of note SCr >10, was ~1 one year ago, pt states he had "kidney problems" in the past that had resolved.  Noted plans to dialyze patient today.  Antibiotics will need to be adjusted in light of plans for HD.  Goal of Therapy:  Vancomycin trough level 15-20 mcg/ml  Plan:  1. Change cefepime to 1g q 24 hrs for now (give additional 500 mg to make total 1g today), could transition to 2g q HD once he is dialyzing on a regular schedule. 2. Vancomycin 1g after each HD.  First dose will be needed after HD today.  Uvaldo Rising, BCPS  Clinical Pharmacist Pager [phone]  02/05/2013 11:28 AM

## 2013-02-05 NOTE — H&P (Addendum)
[facility] History and Physical  Estel Lindskog [id number] DOB: 12/21/1965 DOA: 02/04/2013  Referring physician: Dr. Darl Householder. PCP: [name], MD  Specialists: Nephrologist at [facility].  Chief Complaint: Cough and increasing lower extremity swelling.  HPI: James Nicholson is a 47 y.o. male was brought to the ER by patient's brother after patient was found to have increasing cough and lower extremity edema. Patient has history of schizophrenia and has difficulty communicating. Patient only speaks in [place] language. Patient's brother provided most of the history as Optometrist. As per patient's brother patient has had history of renal failure in 2010 where patient had required cyclophosphamide and prednisone. Patient was continued on prednisone for 5 months after which it was discontinued. At that time patient also had V. fib arrest and ICD was placed. Since last July patient has been staying in nursing home and PPD done at that time was positive and patient was placed on INH for 9 months. Yesterday patient's brother was called by the nursing home staff as patient was found to be increasingly weak and complained of cough with lower extremity edema. Patient has been having cough for last 2 weeks and sometimes had coughed up blood. Patient also has been having some bloody stools. Patient has been noticed to have increased difficulty walking because of the lower extremity edema. In addition patient has been having subjective feeling of fever and chills. In the ER patient's chest x-ray showed possibility of pneumonia versus edema versus hemorrhage. Patient's hemoglobin is around 7 with creatinine around 10. Last labs in our system was in last July which was normal. Patient's stool for occult blood was positive. Patient has been admitted for further management.  Review of Systems: As presented in the history of presenting illness, rest negative.  Past Medical History  Diagnosis Date   . Hypertension   . Dyslipidemia   . Anemia   . Cellulitis   . Atrial fibrillation   . Enterobacter sepsis   . Schizophrenia    Past Surgical History  Procedure Laterality Date  . Cardiac defibrillator placement    . Pacemaker insertion     Social History:  reports that he quit smoking about 4 years ago. He has never used smokeless tobacco. He reports that he does not drink alcohol or use illicit drugs. Lives at nursing home. where does patient live-- Not sure. Can patient participate in ADLs?  No Known Allergies  Family History  Problem Relation Age of Onset  . CAD Mother       Prior to Admission medications   Medication Sig Start Date End Date Taking? Authorizing Provider  gabapentin (NEURONTIN) 300 MG capsule Take 300 mg by mouth 2 (two) times daily.   Yes Historical Provider, MD  isoniazid (NYDRAZID) 300 MG tablet Take 300 mg by mouth daily.   Yes Historical Provider, MD  naproxen (NAPROSYN) 500 MG tablet Take 500 mg by mouth 2 (two) times daily as needed (for pain).    Yes Historical Provider, MD  omeprazole (PRILOSEC) 20 MG capsule Take 20 mg by mouth daily.   Yes Historical Provider, MD  pyridOXINE (B-6) 50 MG tablet Take 1 tablet (50 mg total) by mouth daily. 05/22/12 05/22/13 Yes Thayer Headings, MD  risperiDONE (RISPERDAL) 0.5 MG tablet Take 0.5 mg by mouth daily.    Yes Historical Provider, MD  traMADol (ULTRAM) 50 MG tablet Take 50 mg by mouth every 6 (six) hours as needed for pain.   Yes Historical Provider, MD   Physical Exam: Danley Danker  Vitals:   02/05/13 0145 02/05/13 0200 02/05/13 0235 02/05/13 0245  BP: 120/66 125/61 141/71 126/74  Pulse: 85 94 96 86  Temp:  98.6 F (37 C) 98.1 F (36.7 C)   TempSrc:  Oral Oral   Resp: 18 19 13 22   Height:   5\' 6"  (1.676 m)   Weight:   71.6 kg (157 lb 13.6 oz)   SpO2: 96% 96% 96% 96%     General:  Well-developed well-nourished.  Eyes: Anicteric mild pallor.  ENT: No discharge from the ears eyes nose and mouth.  Neck:  No mass felt.  Cardiovascular: S1-S2 heard.  Respiratory: No rhonchi or crepitations.  Abdomen: Soft nontender bowel sounds present.  Skin: No rash.  Musculoskeletal: Bilateral lower extremity edema.  Psychiatric: Alert and awake.  Neurologic: Follows commands and moves all extremities.  Labs on Admission:  Basic Metabolic Panel:  Recent Labs Lab 02/04/13 2110  NA 133*  K 5.7*  CL 103  CO2 15*  GLUCOSE 124*  BUN 91*  CREATININE 10.25*  CALCIUM 8.8   Liver Function Tests:  Recent Labs Lab 02/04/13 2110  AST 16  ALT <5  ALKPHOS 52  BILITOT 0.5  PROT 7.1  ALBUMIN 2.8*   No results found for this basename: LIPASE, AMYLASE,  in the last 168 hours No results found for this basename: AMMONIA,  in the last 168 hours CBC:  Recent Labs Lab 02/04/13 2110  WBC 9.4  NEUTROABS 7.0  HGB 7.0*  HCT 20.2*  MCV 82.1  PLT 296   Cardiac Enzymes: No results found for this basename: CKTOTAL, CKMB, CKMBINDEX, TROPONINI,  in the last 168 hours  BNP (last 3 results) No results found for this basename: PROBNP,  in the last 8760 hours CBG: No results found for this basename: GLUCAP,  in the last 168 hours  Radiological Exams on Admission: Dg Chest 2 View  02/04/2013  *RADIOLOGY REPORT*  Clinical Data: Fever.  Hemoptysis.  CHEST - 2 VIEW  Comparison: Two-view chest 05/03/2012.  Findings: The heart size is exaggerated by low lung volumes.  A single lead AICD is stable.  Patchy interstitial and airspace disease is present bilaterally.  The visualized soft tissues and bony thorax are unremarkable.  IMPRESSION:  1.  New patchy interstitial airspace disease.  Differential diagnosis includes edema, hemorrhage, or infection. 2.  Low lung volumes.   Original Report Authenticated By: San Morelle, M.D.      Assessment/Plan Principal Problem:   ARF (acute renal failure) Active Problems:   Other primary cardiomyopathies   PPD positive   Anemia   Pneumonia   GI bleed    Metabolic acidosis   1. Acute renal failure with metabolic acidosis and hyperkalemia with previous history of renal failure and being on cyclophosphamide and prednisone - patient is a poor historian and not sure if patient is making adequate urine. CT abdomen and pelvis without contrast is pending and will look for any hydronephrosis or obstruction. I have discussed with on-call nephrologist Dr. Justin Mend. At this time and they will be seeing in consult. Closely follow intake output and daily weights. Patient did receive Kayexalate for hyperkalemia. Sodium bicarbonate tablets has been added as requested by nephrologist for acidosis. Discontinue NSAIDs and Neurontin. 2. Possible pneumonia versus pulmonary hemorrhage versus edema - patient has been empirically started on antibiotics. At this time CT chest without contrast is pending. I have consulted pulmonary for their recommendations. Patient does have a history of PPD positive but I don't think  patient has any active tuberculosis now. We'll follow pulmonary consult. 3. Anemia with stool for occult blood positive - anemia probably from renal failure and also possible GI bleed. Patient is receiving PRBC transfusion. Repeat CBC in a.m. Bilirubin is normal so unlikely to be hemolytic process. Continue protonix infusion. 4. History of V. fib arrest status post ICD placement. 5. History of schizophrenia - continue to risperdal which is metabolized through the liver. 6. History of PPD positive - see #2.    Code Status: Full code.  Family Communication: Patient's brother.  Disposition Plan: Admit to inpatient.    Rea Kalama N. [facility] Pager [phone].  If 7PM-7AM, please contact night-coverage [email or web address] Password [email or web address] 02/05/2013, 3:26 AM

## 2013-02-05 NOTE — Procedures (Signed)
Procedure:  Tunneled HD catheter Access:  Right IJ vein 19 cm tip to cuff length Hemosplit catheter placed with tip in RA.  No PTX.  OK to use catheter for HD.

## 2013-02-05 NOTE — Progress Notes (Addendum)
ANTIBIOTIC CONSULT NOTE - INITIAL  Pharmacy Consult for vancomycin and cefepime Indication: rule out pneumonia  No Known Allergies  Patient Measurements: Weight: ~70kg  Vital Signs: Temp: 100.8 F (38.2 C) (04/10 2102) Temp src: Oral (04/10 2102) BP: 135/74 mmHg (04/10 2243) Pulse Rate: 101 (04/10 2243)  Labs:  Recent Labs  02/04/13 2110  WBC 9.4  HGB 7.0*  PLT 296  CREATININE 10.25*    Microbiology: No results found for this or any previous visit (from the past 720 hour(s)).  Medical History: Past Medical History  Diagnosis Date  . Hypertension   . Dyslipidemia   . Anemia   . Cellulitis   . Atrial fibrillation   . Enterobacter sepsis   . Schizophrenia     Assessment: 47yo male c/o productive cough with low abdominal pain and generalized weakness, CXR shows possible infection, to begin IV ABX; of note SCr >10, was ~1 one year ago, pt states he had "kidney problems" in the past that had resolved.  Goal of Therapy:  Vancomycin trough level 15-20 mcg/ml  Plan:  Will begin vancomycin 1000mg  IV Q48H and cefepime 500mg  IV Q24H and monitor CBC, Cx, levels prn.  Wynona Neat, PharmD, BCPS  02/05/2013,12:18 AM

## 2013-02-05 NOTE — Consult Note (Signed)
PULMONARY  / CRITICAL CARE MEDICINE  Name: James Nicholson MRN: [id number] DOB: 1966/10/25    ADMISSION DATE:  02/04/2013 CONSULTATION DATE:  02/05/2013  REFERRING MD :  Hal Hope PRIMARY SERVICE: [facility]  CHIEF COMPLAINT:  Weakness  BRIEF PATIENT DESCRIPTION: 47 y/o male with necrotizing granulomatosis was admitted on 4/10 for a flare of the same.  SIGNIFICANT EVENTS / STUDIES:  4/11 CT C/A/P >> pending  LINES / TUBES:   CULTURES: 4/11 blood >> 4/11 urine >>  ANTIBIOTICS: 4/11 cefepime >> 4/11 vanc >>  HISTORY OF PRESENT ILLNESS:  This is a very pleasant 47 year old male with necrotizing granulomatosis from [place] who does not speak English who was admitted on April 10 for weakness, cough, anemia, and renal failure.  His brother provides the history as a Optometrist. His brother tells me that in 2006 the patient was admitted to a hospital in [place] and was treated with Cytoxan and prednisone for damage to his kidneys. He was later admitted for a flare of the same during that year and treated with prednisone. Around 2010 the patient relocated to the [place] and has been under the care of a nephrologist in the [facility] area. He has been off of prednisone since September 2010 and has not had any problems. However, yesterday his brother received a phone call from his caregiver (the patient has an ill-defined mental illness and lives with a full-time caregiver) who stated that the patient was quite sleepy and not acting right. From the sounds of things, for about 2 weeks the patient has had cough, occasional scant hemoptysis, weakness, and leg swelling. The patient's brother brought into the hospital this evening and he was found to be in acute renal failure. It sounds as if he is not compliant with his medications. It is unclear if he has had fevers or chills.   PAST MEDICAL HISTORY :  Past Medical History  Diagnosis Date  . Hypertension   . Dyslipidemia   . Anemia    . Cellulitis   . Atrial fibrillation   . Enterobacter sepsis   . Schizophrenia    Past Surgical History  Procedure Laterality Date  . Cardiac defibrillator placement    . Pacemaker insertion     Prior to Admission medications   Medication Sig Start Date End Date Taking? Authorizing Provider  gabapentin (NEURONTIN) 300 MG capsule Take 300 mg by mouth 2 (two) times daily.   Yes Historical Provider, MD  isoniazid (NYDRAZID) 300 MG tablet Take 300 mg by mouth daily.   Yes Historical Provider, MD  naproxen (NAPROSYN) 500 MG tablet Take 500 mg by mouth 2 (two) times daily as needed (for pain).    Yes Historical Provider, MD  omeprazole (PRILOSEC) 20 MG capsule Take 20 mg by mouth daily.   Yes Historical Provider, MD  pyridOXINE (B-6) 50 MG tablet Take 1 tablet (50 mg total) by mouth daily. 05/22/12 05/22/13 Yes Thayer Headings, MD  risperiDONE (RISPERDAL) 0.5 MG tablet Take 0.5 mg by mouth daily.    Yes Historical Provider, MD  traMADol (ULTRAM) 50 MG tablet Take 50 mg by mouth every 6 (six) hours as needed for pain.   Yes Historical Provider, MD   No Known Allergies  FAMILY HISTORY:  Family History  Problem Relation Age of Onset  . CAD Mother    SOCIAL HISTORY:  reports that he quit smoking about 4 years ago. He has never used smokeless tobacco. He reports that he does not drink alcohol or  use illicit drugs.  REVIEW OF SYSTEMS:  Cannot obtain secondary to confusion and language barrier  SUBJECTIVE:   VITAL SIGNS: Temp:  [98.1 F (36.7 C)-100.8 F (38.2 C)] 98.1 F (36.7 C) (04/11 0235) Pulse Rate:  [79-104] 79 (04/11 0330) Resp:  [13-25] 20 (04/11 0330) BP: (119-152)/(58-81) 119/69 mmHg (04/11 0330) SpO2:  [96 %-98 %] 96 % (04/11 0330) Weight:  [71.6 kg (157 lb 13.6 oz)] 71.6 kg (157 lb 13.6 oz) (04/11 0235) HEMODYNAMICS:   VENTILATOR SETTINGS:   INTAKE / OUTPUT: Intake/Output     04/10 0701 - 04/11 0700   I.V. (mL/kg) 3 (0)   Blood 625   Total Intake(mL/kg) 628 (8.8)    Net +628       Stool Occurrence 1 x     PHYSICAL EXAMINATION:  Gen: well appearing, no acute distress HEENT: NCAT, PERRL, EOMi, OP clear, neck supple without masses PULM: Insp crackles throughout R lung > L CV: RRR, no mgr, no JVD AB: BS+, soft, mild tenderness all over, no hsm Ext: warm, pitting edema to mid shin, no clubbing, no cyanosis Derm: no rash or skin breakdown Neuro: A&O, CN II-XII intact, follows commands and has good strength in 4/4 ext   LABS:  Recent Labs Lab 02/04/13 2110 02/04/13 2345 02/05/13 0146  HGB 7.0*  --   --   WBC 9.4  --   --   PLT 296  --   --   NA 133*  --   --   K 5.7*  --   --   CL 103  --   --   CO2 15*  --   --   GLUCOSE 124*  --   --   BUN 91*  --   --   CREATININE 10.25*  --   --   CALCIUM 8.8  --   --   AST 16  --   --   ALT <5  --   --   ALKPHOS 52  --   --   BILITOT 0.5  --   --   PROT 7.1  --   --   ALBUMIN 2.8*  --   --   LATICACIDVEN  --  0.7  --   PHART  --   --  7.289*  PCO2ART  --   --  29.0*  PO2ART  --   --  81.0   No results found for this basename: GLUCAP,  in the last 168 hours  4/11 CXR: ICD in place, bilateral patchy infiltrates  ASSESSMENT / PLAN:  RHEUMATOLOGY A: History of granulomatosis with polyangitis (Wegener's) per brother; currently he appears to have a flare of the same based on his prior presentations P: -agree with obtaining old records -check ANCA and mpo/pr3 -will start solumedrol 250mg  IV q6h x3 days -may need cytoxan for kidney disease, will leave up to renal  PULMONARY A: Acute, bilateral airspace disease by imaging most consistent with pneumonitis versus hemorrhage from small vessel vasculitis; currently not in respiratory distress P:   -See rheum above  CARDIOVASCULAR A: Hypertension, currently well controlled P:  -hold home ACE for now  RENAL A:  AKI secondary to granulomatosis with polyangitis  Hyperkalemia from AKI P:   kayexalate, calcium, d50, insulin now -repeat  BMET 0700 on 4/11 -otherwise per renal  GASTROINTESTINAL A:  ? GI bleed, no clear evidence by history P:   -monitor stools for evidence of blood  HEMATOLOGIC A:  Anemia; appears to have [place]  P:  -Hgb transfusion threshold <7gm/dL  INFECTIOUS A:  Positive PPD interpreted by health department as latent TB, but has history of HCG vaccine; unlikely to be contributing to current illness P:   -INH per home regimen -consider quantiferon GOLD as can help sort out definite latent TB vs HCG vaccine causing positive PPD  ENDOCRINE A:  No acute issues P:     NEUROLOGIC A:  No acute issues P:     TODAY'S SUMMARYLemmie Evens Pulmonary and [place] Pager: [phone]  02/05/2013, 4:24 AM

## 2013-02-05 NOTE — Consult Note (Signed)
Requesting Physician:  Dr. Marye Round Reason for Consult:  Renal failure  PLEASE SEE RECORDS IN PAPER CHART FOR PRIOR RENAL EVALUATION(S)  HPI: The patient is a 47 y.o. year-old [place] male with a past history of biopsy proven P-ANCA positive necrotizing GN for which he was treated with IV cytoxan and steroids in [place] in March 2010.  Subsequently was relocated to [facility] where he was followed by Dr. Cheron Every, [facility], last seen there in October 2010 (0.91) and last labs in November 2010 showing a creatinine of 1.04, no proteinuria.  Unfortunately he never returned for followup and was relocated to the [facility] area because his brother moved here and has been residing in a group home because of inability to care for himself due to some psychiatric issues (schizophrenia). His last renal function test was last July 2013 and creatinine was 1.15 at that time.  The group home supervisor noted the patient had leg swelling, was coughing, and reportedly had some hemoptysis.  He was brought to the ED where he had a creatinine of 10, blood and protein in his urine, significant anemia and hyperkalemia.  We are called to consult.  History is difficult as patients brother states he is always confused and will say one thing one minute, another the next, but it sounds like a few months back patient reported his urine was "red" and then for the past couple of months has looked foamy but clear.  Legs have been swelling for unknown period of time.  Appetite is poor.    Creatinine, Ser  Date/Time Value Range Status  02/05/2013  7:10 AM 10.71* 0.50 - 1.35 mg/dL Final  02/04/2013  9:10 PM 10.25* 0.50 - 1.35 mg/dL Final       02/26/2012 10:25 AM 1.00  0.50 - 1.35 mg/dL Final   Creat  Date/Time Value Range Status  05/22/2012 10:52 AM 1.15  0.50 - 1.35 mg/dL Final    Past Medical History:  Past Medical History  Diagnosis Date  . Hypertension   . Dyslipidemia   . Anemia   . Cellulitis   .  Atrial fibrillation   . Enterobacter sepsis   . Schizophrenia     Past Surgical History:  Past Surgical History  Procedure Laterality Date  . Cardiac defibrillator placement    . Pacemaker insertion     Family History:  Family History  Problem Relation Age of Onset  . CAD Mother    Social History:  reports that he quit smoking about 4 years ago. He has never used smokeless tobacco. He reports that he does not drink alcohol or use illicit drugs.  Allergies: No Known Allergies  Home medications: Prior to Admission medications   Medication Sig Start Date End Date Taking? Authorizing Provider  gabapentin (NEURONTIN) 300 MG capsule Take 300 mg by mouth 2 (two) times daily.   Yes Historical Provider, MD  isoniazid (NYDRAZID) 300 MG tablet Take 300 mg by mouth daily.   Yes Historical Provider, MD  naproxen (NAPROSYN) 500 MG tablet Take 500 mg by mouth 2 (two) times daily as needed (for pain).    Yes Historical Provider, MD  omeprazole (PRILOSEC) 20 MG capsule Take 20 mg by mouth daily.   Yes Historical Provider, MD  pyridOXINE (B-6) 50 MG tablet Take 1 tablet (50 mg total) by mouth daily. 05/22/12 05/22/13 Yes Thayer Headings, MD  risperiDONE (RISPERDAL) 0.5 MG tablet Take 0.5 mg by mouth daily.    Yes Historical Provider, MD  traMADol (  ULTRAM) 50 MG tablet Take 50 mg by mouth every 6 (six) hours as needed for pain.   Yes Historical Provider, MD    Inpatient medications: . ceFEPime (MAXIPIME) IV  500 mg Intravenous Q24H  . isoniazid  300 mg Oral Daily  . methylPREDNISolone (SOLU-MEDROL) injection  250 mg Intravenous Q6H  . pantoprazole (PROTONIX) IV  40 mg Intravenous QHS  . vitamin B-6  50 mg Oral Daily  . risperiDONE  0.5 mg Oral Daily  . sodium bicarbonate  650 mg Oral BID  . sodium chloride  3 mL Intravenous Q12H  . sodium chloride  3 mL Intravenous Q12H  . vancomycin  1,000 mg Intravenous Q48H    Review of Systems As per HPI and obtained through brothers  translation  Physical Exam:  Blood pressure 115/71, pulse 81, temperature 97.4 F (36.3 C), temperature source Oral, resp. rate 16, height 5\' 6"  (1.676 m), weight 71.6 kg (157 lb 13.6 oz), SpO2 97.00%.  Gen: Middle aged [place] male looking older than stated age Very limited English Having nosebleed Skin: no rash, cyanosis Neck: JVP approx 6 cm Chest: Clear anteriorly/coarse BS post Heart: regular, no rub or gallop Abdomen: soft,no focal tenerness Ext: 1+ edema bilat Neuro: Alert could not assess orientation +asterixus  LabsBasic Metabolic Panel:  Recent Labs Lab 02/04/13 2110 02/05/13 0710  NA 133* 134*  K 5.7* 4.9  CL 103 104  CO2 15* 15*  GLUCOSE 124* 73  BUN 91* 97*  CREATININE 10.25* 10.71*  CALCIUM 8.8 8.8   Liver Function Tests:  Recent Labs Lab 02/04/13 2110 02/05/13 0710  AST 16 15  ALT <5 <5  ALKPHOS 52 48  BILITOT 0.5 0.8  PROT 7.1 6.6  ALBUMIN 2.8* 2.5*   Recent Labs Lab 02/04/13 2110 02/05/13 0710  WBC 9.4 7.9  NEUTROABS 7.0 6.4  HGB 7.0* 6.9*  HCT 20.2* 19.5*  MCV 82.1 80.2  PLT 296 256    Recent Labs Lab 02/05/13 0422 02/05/13 0800  GLUCAP 101* 93    Iron Studies: No results found for this basename: IRON, TIBC, TRANSFERRIN, FERRITIN,  in the last 168 hours  Xrays/Other Studies: Ct Abdomen Pelvis Wo Contrast  02/05/2013  *RADIOLOGY REPORT*  Clinical Data:  Productive cough, lower abdominal pain and lower leg pain; generalized weakness.  CT CHEST, ABDOMEN AND PELVIS WITHOUT CONTRAST  Technique:  Multidetector CT imaging of the chest, abdomen and pelvis was performed following the standard protocol without IV contrast.  Comparison:  Chest radiograph performed 02/04/2013  CT CHEST  Findings:  There is diffuse opacification involving both lungs, relatively fluffy in appearance, with peripheral sparing.  This may reflect diffuse pneumonia or possibly pulmonary alveolar hemorrhage.  The appearance is somewhat less typical for pulmonary  edema.  No pleural effusion or pneumothorax is seen.  No definite masses are identified.  The heart is borderline enlarged.  An AICD lead is noted ending at the right ventricle.  No mediastinal lymphadenopathy is seen.  No pericardial effusion is identified.  The great vessels are grossly unremarkable in appearance.  The thyroid gland is grossly unremarkable in appearance.  No axillary lymphadenopathy is seen.  No acute osseous abnormalities are identified.  IMPRESSION:  1.  Diffuse opacification involving both lungs, with peripheral sparing.  This may reflect diffuse pneumonia or possibly pulmonary alveolar hemorrhage.  The appearance is somewhat less typical for pulmonary edema, though it remains a possibility. 2.  Borderline cardiomegaly.  CT ABDOMEN AND PELVIS  Findings:  The liver and  spleen are unremarkable in appearance. The gallbladder is within normal limits.  The pancreas is somewhat prominent, though this likely remains within normal limits.  The adrenal glands are unremarkable in appearance.  The kidneys are unremarkable in appearance.  There is no evidence of hydronephrosis.  No renal or ureteral stones are seen.  No perinephric stranding is appreciated.  No free fluid is identified.  The small bowel is unremarkable in appearance.  The stomach is within normal limits.  No acute vascular abnormalities are seen.  Mildly prominent periaortic nodes are seen, of uncertain significance.  The appendix appears grossly normal in caliber, without evidence for appendicitis.  Contrast progresses to the level of the rectum. The colon is unremarkable in appearance.  The bladder is moderately distended and grossly unremarkable in appearance; a small urachal remnant is incidentally noted.  The prostate remains normal in size.  No inguinal lymphadenopathy is seen.  No acute osseous abnormalities are identified.  IMPRESSION:  1.  No acute abnormality seen within the abdomen or pelvis. 2.  Mildly prominent periaortic  nodes, of uncertain significance.   Original Report Authenticated By: Santa Lighter, M.D.    Dg Chest 2 View  02/04/2013  *RADIOLOGY REPORT*  Clinical Data: Fever.  Hemoptysis.  CHEST - 2 VIEW  Comparison: Two-view chest 05/03/2012.  Findings: The heart size is exaggerated by low lung volumes.  A single lead AICD is stable.  Patchy interstitial and airspace disease is present bilaterally.  The visualized soft tissues and bony thorax are unremarkable.  IMPRESSION:  1.  New patchy interstitial airspace disease.  Differential diagnosis includes edema, hemorrhage, or infection. 2.  Low lung volumes.   Original Report Authenticated By: San Morelle, M.D.    Ct Chest Wo Contrast  02/05/2013  *RADIOLOGY REPORT*  Clinical Data:  Productive cough, lower abdominal pain and lower leg pain; generalized weakness.  CT CHEST, ABDOMEN AND PELVIS WITHOUT CONTRAST  Technique:  Multidetector CT imaging of the chest, abdomen and pelvis was performed following the standard protocol without IV contrast.  Comparison:  Chest radiograph performed 02/04/2013  CT CHEST  Findings:  There is diffuse opacification involving both lungs, relatively fluffy in appearance, with peripheral sparing.  This may reflect diffuse pneumonia or possibly pulmonary alveolar hemorrhage.  The appearance is somewhat less typical for pulmonary edema.  No pleural effusion or pneumothorax is seen.  No definite masses are identified.  The heart is borderline enlarged.  An AICD lead is noted ending at the right ventricle.  No mediastinal lymphadenopathy is seen.  No pericardial effusion is identified.  The great vessels are grossly unremarkable in appearance.  The thyroid gland is grossly unremarkable in appearance.  No axillary lymphadenopathy is seen.  No acute osseous abnormalities are identified.  IMPRESSION:  1.  Diffuse opacification involving both lungs, with peripheral sparing.  This may reflect diffuse pneumonia or possibly pulmonary alveolar  hemorrhage.  The appearance is somewhat less typical for pulmonary edema, though it remains a possibility. 2.  Borderline cardiomegaly.  CT ABDOMEN AND PELVIS  Findings:  The liver and spleen are unremarkable in appearance. The gallbladder is within normal limits.  The pancreas is somewhat prominent, though this likely remains within normal limits.  The adrenal glands are unremarkable in appearance.  The kidneys are unremarkable in appearance.  There is no evidence of hydronephrosis.  No renal or ureteral stones are seen.  No perinephric stranding is appreciated.  No free fluid is identified.  The small bowel is unremarkable in appearance.  The stomach is within normal limits.  No acute vascular abnormalities are seen.  Mildly prominent periaortic nodes are seen, of uncertain significance.  The appendix appears grossly normal in caliber, without evidence for appendicitis.  Contrast progresses to the level of the rectum. The colon is unremarkable in appearance.  The bladder is moderately distended and grossly unremarkable in appearance; a small urachal remnant is incidentally noted.  The prostate remains normal in size.  No inguinal lymphadenopathy is seen.  No acute osseous abnormalities are identified.  IMPRESSION:  1.  No acute abnormality seen within the abdomen or pelvis. 2.  Mildly prominent periaortic nodes, of uncertain significance.   Original Report Authenticated By: Santa Lighter, M.D.      Impression/Plan 47 yo [place] male with history of P-ANCA positive necrotizing GN in 2010, treated at that time with steroids and cytoxan with resolution, but with no renal followup, last creatinine July 2013 1.15 with several months of symptoms referable to urinary tract includeing "red urine" several months back, then "foamy since that time, who presents with severe renal failure, severe anemia, and report of hemoptysis with uremic symptoms.  1. Renal failure with h/o P-ANCA + necrotizing GN and treatment with  cytoxan 2010 Suspect recurrence of original disease but with this late presentation likelihood of any renal recovery would be extremely low and for that reason do not recommend rebiopsy or empiric retreatment with cytoxan for renal purposes.   He is in need of dialysis.  His long term candidacy is difficult to assess because of his living and psychiatric issues but his brother wants this done for him at this time.  Will look at renal ultrasound ANCA studies ordered by primary team IR to place permcath with initiation of dialysis First treatment today Transfuse 2 units of PRBC's with HD Check iron studies and PTH As above do not recommend rebiopsy or empiric cytoxan for renal issues If pulmonary feels he needs cytoxan for pulmonary hemorrhage then OK to give, but would need attenuated dose to avoid pancytopenia (appears they are giving him pulse steroids at this time) Vein map Notify [facility] for permanent access next week CLIP for outpt dialysis (need to clarify what his living arrangements will be post D/C as unsure if group home will take him back on HD)  2) Hemoptysis in setting of prior + P-ANCA Steroids Decision for cytoxan for lung hemorrhage per pulm ANCA studies pending  3) Anemia Transfuse on HD Checking iron studies  4)CKD-MBD Checking PTH  5) Schizophrenia See's a Dr. Lovena Le at [facility] (?) for med adj Lives in [place]  6) H/O HTN not issue at present  7) H/O hyperlipidemia Check lipid panel  8) ICD/Pacer in place  Jamal Maes,  MD [place] pager 02/05/2013, 10:35 AM

## 2013-02-06 DIAGNOSIS — M3130 Wegener's granulomatosis without renal involvement: Secondary | ICD-10-CM

## 2013-02-06 DIAGNOSIS — N179 Acute kidney failure, unspecified: Principal | ICD-10-CM

## 2013-02-06 DIAGNOSIS — N19 Unspecified kidney failure: Secondary | ICD-10-CM

## 2013-02-06 DIAGNOSIS — D649 Anemia, unspecified: Secondary | ICD-10-CM

## 2013-02-06 DIAGNOSIS — R7611 Nonspecific reaction to tuberculin skin test without active tuberculosis: Secondary | ICD-10-CM

## 2013-02-06 LAB — RENAL FUNCTION PANEL
Albumin: 2.6 g/dL — ABNORMAL LOW (ref 3.5–5.2)
BUN: 72 mg/dL — ABNORMAL HIGH (ref 6–23)
CO2: 19 mEq/L (ref 19–32)
Calcium: 8.8 mg/dL (ref 8.4–10.5)
Chloride: 99 mEq/L (ref 96–112)
Creatinine, Ser: 8.41 mg/dL — ABNORMAL HIGH (ref 0.50–1.35)
GFR calc Af Amer: 8 mL/min — ABNORMAL LOW (ref >90)
GFR calc non Af Amer: 7 mL/min — ABNORMAL LOW (ref >90)
Glucose, Bld: 162 mg/dL — ABNORMAL HIGH (ref 70–99)
Phosphorus: 6.3 mg/dL — ABNORMAL HIGH (ref 2.3–4.6)
Potassium: 4.6 mEq/L (ref 3.5–5.1)
Sodium: 135 mEq/L (ref 135–145)

## 2013-02-06 LAB — TYPE AND SCREEN
ABO/RH(D): A POS
Antibody Screen: NEGATIVE
Unit division: 0
Unit division: 0
Unit division: 0
Unit division: 0

## 2013-02-06 LAB — CBC
HCT: 27.5 % — ABNORMAL LOW (ref 39.0–52.0)
Hemoglobin: 10.1 g/dL — ABNORMAL LOW (ref 13.0–17.0)
MCH: 28.4 pg (ref 26.0–34.0)
MCHC: 36.7 g/dL — ABNORMAL HIGH (ref 30.0–36.0)
MCV: 77.2 fL — ABNORMAL LOW (ref 78.0–100.0)
Platelets: 261 10*3/uL (ref 150–400)
RBC: 3.56 MIL/uL — ABNORMAL LOW (ref 4.22–5.81)
RDW: 15.2 % (ref 11.5–15.5)
WBC: 8.4 10*3/uL (ref 4.0–10.5)

## 2013-02-06 LAB — GLUCOSE, CAPILLARY
Glucose-Capillary: 141 mg/dL — ABNORMAL HIGH (ref 70–99)
Glucose-Capillary: 153 mg/dL — ABNORMAL HIGH (ref 70–99)
Glucose-Capillary: 168 mg/dL — ABNORMAL HIGH (ref 70–99)
Glucose-Capillary: 195 mg/dL — ABNORMAL HIGH (ref 70–99)

## 2013-02-06 LAB — URINE CULTURE
Colony Count: NO GROWTH
Culture: NO GROWTH
Special Requests: NORMAL

## 2013-02-06 LAB — FERRITIN: Ferritin: 1377 ng/mL — ABNORMAL HIGH (ref 22–322)

## 2013-02-06 LAB — HEPATITIS B CORE ANTIBODY, TOTAL: Hep B Core Total Ab: NEGATIVE

## 2013-02-06 LAB — HEPATITIS B SURFACE ANTIGEN: Hepatitis B Surface Ag: NEGATIVE

## 2013-02-06 MED ORDER — GUAIFENESIN-DM 100-10 MG/5ML PO SYRP
5.0000 mL | ORAL_SOLUTION | ORAL | Status: DC | PRN
  Administered 2013-02-06 – 2013-02-11 (×3): 5 mL via ORAL
  Filled 2013-02-06 (×3): qty 5

## 2013-02-06 MED ORDER — INSULIN ASPART 100 UNIT/ML ~~LOC~~ SOLN
0.0000 [IU] | Freq: Three times a day (TID) | SUBCUTANEOUS | Status: DC
  Administered 2013-02-06: 2 [IU] via SUBCUTANEOUS
  Administered 2013-02-07: 1 [IU] via SUBCUTANEOUS
  Administered 2013-02-07: 2 [IU] via SUBCUTANEOUS
  Administered 2013-02-07 – 2013-02-08 (×3): 1 [IU] via SUBCUTANEOUS
  Administered 2013-02-09 – 2013-02-10 (×2): 2 [IU] via SUBCUTANEOUS

## 2013-02-06 MED ORDER — DARBEPOETIN ALFA-POLYSORBATE 100 MCG/0.5ML IJ SOLN
INTRAMUSCULAR | Status: AC
  Administered 2013-02-06: 100 ug via INTRAVENOUS
  Filled 2013-02-06: qty 0.5

## 2013-02-06 MED ORDER — DARBEPOETIN ALFA-POLYSORBATE 100 MCG/0.5ML IJ SOLN
100.0000 ug | INTRAMUSCULAR | Status: DC
  Filled 2013-02-06: qty 0.5

## 2013-02-06 MED ORDER — INSULIN DETEMIR 100 UNIT/ML ~~LOC~~ SOLN
5.0000 [IU] | Freq: Every day | SUBCUTANEOUS | Status: DC
  Administered 2013-02-06 – 2013-02-10 (×5): 5 [IU] via SUBCUTANEOUS
  Filled 2013-02-06 (×6): qty 0.05

## 2013-02-06 NOTE — Progress Notes (Signed)
PULMONARY  / CRITICAL CARE MEDICINE  Name: James Nicholson MRN: [id number] DOB: [birth year]    ADMISSION DATE:  02/04/2013 CONSULTATION DATE:  02/05/2013  REFERRING MD :  Hal Hope PRIMARY SERVICE: TRH  CHIEF COMPLAINT:  Weakness  BRIEF PATIENT DESCRIPTION: 47 y/o male with necrotizing granulomatosis was admitted on 4/10 for a flare of the same.  SIGNIFICANT EVENTS: 4/11 Admit, Abx, high dose solumedrol, renal consulted 4/12 Start cytoxan  STUDIES:  4/11 CT chest >> b/l diffuse fluffy ASD sparing periphery 4/11 ANCA >>  LINES / TUBES: Rt IJ HD cath 4/11 >>   CULTURES: 4/11 blood >> 4/11 urine >> 4/12 Quantiferon gold >>  ANTIBIOTICS: 4/11 cefepime >> 4/12 4/11 vanc >> 4/12  SUBJECTIVE:  Still has cough.  No further epistaxis.  VITAL SIGNS: Temp:  [97.6 F (36.4 C)-98.8 F (37.1 C)] 98 F (36.7 C) (04/12 1100) Pulse Rate:  [72-98] 80 (04/12 1215) Resp:  [10-34] 17 (04/12 1215) BP: (108-144)/(56-80) 123/65 mmHg (04/12 1215) SpO2:  [94 %-97 %] 94 % (04/12 1215) Weight:  [150 lb 9.2 oz (68.3 kg)-156 lb 8.4 oz (71 kg)] 150 lb 9.2 oz (68.3 kg) (04/12 0500) INTAKE / OUTPUT: Intake/Output     04/11 0701 - 04/12 0700 04/12 0701 - 04/13 0700   P.O. 360 655   I.V. (mL/kg) 186.5 (2.7)    Blood 1050    IV Piggyback 250    Total Intake(mL/kg) 1846.5 (27) 655 (9.6)   Urine (mL/kg/hr) 650 (0.4)    Other 2700 (1.6)    Total Output 3350     Net -1503.5 +655        Urine Occurrence 2 x    Stool Occurrence 2 x      PHYSICAL EXAMINATION:  Gen: No distress HEENT: no sinus tenderness PULM:  Scattered rhonchi CV: regular, no murmur AB: soft, non tender Ext: no edema Derm: no rashes Neuro: normal strength   LABS:  Recent Labs Lab 02/04/13 2110 02/04/13 2345 02/05/13 0146 02/05/13 0710 02/05/13 1216 02/06/13 0500  HGB 7.0*  --   --  6.9*  --  10.1*  WBC 9.4  --   --  7.9  --  8.4  PLT 296  --   --  256  --  261  NA 133*  --   --  134*  --  135  K 5.7*   --   --  4.9  --  4.6  CL 103  --   --  104  --  99  CO2 15*  --   --  15*  --  19  GLUCOSE 124*  --   --  73  --  162*  BUN 91*  --   --  97*  --  72*  CREATININE 10.25*  --   --  10.71*  --  8.41*  CALCIUM 8.8  --   --  8.8  --  8.8  PHOS  --   --   --   --   --  6.3*  AST 16  --   --  15  --   --   ALT <5  --   --  <5  --   --   ALKPHOS 52  --   --  48  --   --   BILITOT 0.5  --   --  0.8  --   --   PROT 7.1  --   --  6.6  --   --  ALBUMIN 2.8*  --   --  2.5*  --  2.6*  APTT  --   --   --   --  34  --   INR  --   --   --   --  1.30  --   LATICACIDVEN  --  0.7  --   --   --   --   PHART  --   --  7.289*  --   --   --   PCO2ART  --   --  29.0*  --   --   --   PO2ART  --   --  81.0  --   --   --     Recent Labs Lab 02/05/13 0800 02/05/13 1243 02/05/13 1540 02/06/13 0756 02/06/13 1133  GLUCAP 93 124* 136* 141* 153*    Imaging: Ct Abdomen Pelvis Wo Contrast  02/05/2013  *RADIOLOGY REPORT*  Clinical Data:  Productive cough, lower abdominal pain and lower leg pain; generalized weakness.  CT CHEST, ABDOMEN AND PELVIS WITHOUT CONTRAST  Technique:  Multidetector CT imaging of the chest, abdomen and pelvis was performed following the standard protocol without IV contrast.  Comparison:  Chest radiograph performed 02/04/2013  CT CHEST  Findings:  There is diffuse opacification involving both lungs, relatively fluffy in appearance, with peripheral sparing.  This may reflect diffuse pneumonia or possibly pulmonary alveolar hemorrhage.  The appearance is somewhat less typical for pulmonary edema.  No pleural effusion or pneumothorax is seen.  No definite masses are identified.  The heart is borderline enlarged.  An AICD lead is noted ending at the right ventricle.  No mediastinal lymphadenopathy is seen.  No pericardial effusion is identified.  The great vessels are grossly unremarkable in appearance.  The thyroid gland is grossly unremarkable in appearance.  No axillary lymphadenopathy is  seen.  No acute osseous abnormalities are identified.  IMPRESSION:  1.  Diffuse opacification involving both lungs, with peripheral sparing.  This may reflect diffuse pneumonia or possibly pulmonary alveolar hemorrhage.  The appearance is somewhat less typical for pulmonary edema, though it remains a possibility. 2.  Borderline cardiomegaly.  CT ABDOMEN AND PELVIS  Findings:  The liver and spleen are unremarkable in appearance. The gallbladder is within normal limits.  The pancreas is somewhat prominent, though this likely remains within normal limits.  The adrenal glands are unremarkable in appearance.  The kidneys are unremarkable in appearance.  There is no evidence of hydronephrosis.  No renal or ureteral stones are seen.  No perinephric stranding is appreciated.  No free fluid is identified.  The small bowel is unremarkable in appearance.  The stomach is within normal limits.  No acute vascular abnormalities are seen.  Mildly prominent periaortic nodes are seen, of uncertain significance.  The appendix appears grossly normal in caliber, without evidence for appendicitis.  Contrast progresses to the level of the rectum. The colon is unremarkable in appearance.  The bladder is moderately distended and grossly unremarkable in appearance; a small urachal remnant is incidentally noted.  The prostate remains normal in size.  No inguinal lymphadenopathy is seen.  No acute osseous abnormalities are identified.  IMPRESSION:  1.  No acute abnormality seen within the abdomen or pelvis. 2.  Mildly prominent periaortic nodes, of uncertain significance.   Original Report Authenticated By: Santa Lighter, M.D.    Dg Chest 2 View  02/04/2013  *RADIOLOGY REPORT*  Clinical Data: Fever.  Hemoptysis.  CHEST - 2 VIEW  Comparison: Two-view  chest 05/03/2012.  Findings: The heart size is exaggerated by low lung volumes.  A single lead AICD is stable.  Patchy interstitial and airspace disease is present bilaterally.  The visualized  soft tissues and bony thorax are unremarkable.  IMPRESSION:  1.  New patchy interstitial airspace disease.  Differential diagnosis includes edema, hemorrhage, or infection. 2.  Low lung volumes.   Original Report Authenticated By: San Morelle, M.D.    Ct Chest Wo Contrast  02/05/2013  *RADIOLOGY REPORT*  Clinical Data:  Productive cough, lower abdominal pain and lower leg pain; generalized weakness.  CT CHEST, ABDOMEN AND PELVIS WITHOUT CONTRAST  Technique:  Multidetector CT imaging of the chest, abdomen and pelvis was performed following the standard protocol without IV contrast.  Comparison:  Chest radiograph performed 02/04/2013  CT CHEST  Findings:  There is diffuse opacification involving both lungs, relatively fluffy in appearance, with peripheral sparing.  This may reflect diffuse pneumonia or possibly pulmonary alveolar hemorrhage.  The appearance is somewhat less typical for pulmonary edema.  No pleural effusion or pneumothorax is seen.  No definite masses are identified.  The heart is borderline enlarged.  An AICD lead is noted ending at the right ventricle.  No mediastinal lymphadenopathy is seen.  No pericardial effusion is identified.  The great vessels are grossly unremarkable in appearance.  The thyroid gland is grossly unremarkable in appearance.  No axillary lymphadenopathy is seen.  No acute osseous abnormalities are identified.  IMPRESSION:  1.  Diffuse opacification involving both lungs, with peripheral sparing.  This may reflect diffuse pneumonia or possibly pulmonary alveolar hemorrhage.  The appearance is somewhat less typical for pulmonary edema, though it remains a possibility. 2.  Borderline cardiomegaly.  CT ABDOMEN AND PELVIS  Findings:  The liver and spleen are unremarkable in appearance. The gallbladder is within normal limits.  The pancreas is somewhat prominent, though this likely remains within normal limits.  The adrenal glands are unremarkable in appearance.  The kidneys  are unremarkable in appearance.  There is no evidence of hydronephrosis.  No renal or ureteral stones are seen.  No perinephric stranding is appreciated.  No free fluid is identified.  The small bowel is unremarkable in appearance.  The stomach is within normal limits.  No acute vascular abnormalities are seen.  Mildly prominent periaortic nodes are seen, of uncertain significance.  The appendix appears grossly normal in caliber, without evidence for appendicitis.  Contrast progresses to the level of the rectum. The colon is unremarkable in appearance.  The bladder is moderately distended and grossly unremarkable in appearance; a small urachal remnant is incidentally noted.  The prostate remains normal in size.  No inguinal lymphadenopathy is seen.  No acute osseous abnormalities are identified.  IMPRESSION:  1.  No acute abnormality seen within the abdomen or pelvis. 2.  Mildly prominent periaortic nodes, of uncertain significance.   Original Report Authenticated By: Santa Lighter, M.D.    US Renal  02/05/2013  *RADIOLOGY REPORT*  Clinical Data: Renal failure  RENAL/URINARY TRACT ULTRASOUND  Technique: Renal ultrasound  Comparison:  None.  Findings: Both kidneys demonstrate normal echogenicity.  There is no cyst,  mass, stone or hydronephrosis.  Right kidney is 12 cm in length.  Left kidney is 10 cm in length.  Bladder is normal.  IMPRESSION: No specific abnormalities.  There is mild renal size asymmetry. This is a nonspecific finding that can at times be related to vascular insufficiency.,   Original Report Authenticated By: Skipper Cliche, M.D.  Ir Fluoro Guide Cv Line Right  02/05/2013  *RADIOLOGY REPORT*  Clinical Data: Renal failure.  The patient requires a tunneled dialysis catheter for hemodialysis.  TUNNELED CENTRAL VENOUS HEMODIALYSIS CATHETER PLACEMENT WITH ULTRASOUND AND FLUOROSCOPIC GUIDANCE  Sedation:  1.0 mg IV Versed; 75 mcg IV Fentanyl.  Total Moderate Sedation Time:  10 minutes.  Additional  Medications:  1 gram IV Ancef.  As antibiotic prophylaxis, Ancef was ordered pre-procedure and administered intravenously within one hour of incision.  Fluoroscopy Time:  1.2 minutes.  Procedure:  The procedure, risks, benefits, and alternatives were explained to the patient.  Questions regarding the procedure were encouraged and answered.  The patient understands and consents to the procedure.  The right neck and chest were prepped with chlorhexidine in a sterile fashion, and a sterile drape was applied covering the operative field.  Maximum barrier sterile technique with sterile gowns and gloves were used for the procedure.  Local anesthesia was provided with 1% lidocaine.  After creating a small venotomy incision, a 21 gauge needle was advanced into the right internal jugular vein under direct, real- time ultrasound guidance.  Ultrasound image documentation was performed.  After securing guidewire access, an 8 Fr dilator was placed.  A J-wire was kinked to measure appropriate catheter length.  A Bard HemoSplit tunneled hemodialysis catheter measuring 19 cm from tip to cuff was chosen for placement.  This was tunneled in a retrograde fashion from the chest wall to the venotomy incision.  At the venotomy, serial dilatation was performed and a 16 Fr peel- away sheath was placed over a guidewire.  The catheter was then placed through the sheath and the sheath removed.  Final catheter positioning was confirmed and documented with a fluoroscopic spot image.  The catheter was aspirated, flushed with saline, and injected with appropriate volume heparin dwells.  The venotomy incision was closed with subcuticular 4-0 Vicryl. Dermabond was applied to the incision.  The catheter exit site was secured with 0-Prolene retention sutures.  Complications: None.  No pneumothorax.  Findings:  After catheter placement, the tips lie in the right atrium.  The catheter aspirates normally and is ready for immediate use.  IMPRESSION:   Placement of tunneled hemodialysis catheter via the right internal jugular vein.  The catheter tips lie in the right atrium.  The catheter is ready for immediate use.   Original Report Authenticated By: Aletta Edouard, M.D.    Ir US Guide Vasc Access Right  02/05/2013  *RADIOLOGY REPORT*  Clinical Data: Renal failure.  The patient requires a tunneled dialysis catheter for hemodialysis.  TUNNELED CENTRAL VENOUS HEMODIALYSIS CATHETER PLACEMENT WITH ULTRASOUND AND FLUOROSCOPIC GUIDANCE  Sedation:  1.0 mg IV Versed; 75 mcg IV Fentanyl.  Total Moderate Sedation Time:  10 minutes.  Additional Medications:  1 gram IV Ancef.  As antibiotic prophylaxis, Ancef was ordered pre-procedure and administered intravenously within one hour of incision.  Fluoroscopy Time:  1.2 minutes.  Procedure:  The procedure, risks, benefits, and alternatives were explained to the patient.  Questions regarding the procedure were encouraged and answered.  The patient understands and consents to the procedure.  The right neck and chest were prepped with chlorhexidine in a sterile fashion, and a sterile drape was applied covering the operative field.  Maximum barrier sterile technique with sterile gowns and gloves were used for the procedure.  Local anesthesia was provided with 1% lidocaine.  After creating a small venotomy incision, a 21 gauge needle was advanced into the right internal jugular vein  under direct, real- time ultrasound guidance.  Ultrasound image documentation was performed.  After securing guidewire access, an 8 Fr dilator was placed.  A J-wire was kinked to measure appropriate catheter length.  A Bard HemoSplit tunneled hemodialysis catheter measuring 19 cm from tip to cuff was chosen for placement.  This was tunneled in a retrograde fashion from the chest wall to the venotomy incision.  At the venotomy, serial dilatation was performed and a 16 Fr peel- away sheath was placed over a guidewire.  The catheter was then placed  through the sheath and the sheath removed.  Final catheter positioning was confirmed and documented with a fluoroscopic spot image.  The catheter was aspirated, flushed with saline, and injected with appropriate volume heparin dwells.  The venotomy incision was closed with subcuticular 4-0 Vicryl. Dermabond was applied to the incision.  The catheter exit site was secured with 0-Prolene retention sutures.  Complications: None.  No pneumothorax.  Findings:  After catheter placement, the tips lie in the right atrium.  The catheter aspirates normally and is ready for immediate use.  IMPRESSION:  Placement of tunneled hemodialysis catheter via the right internal jugular vein.  The catheter tips lie in the right atrium.  The catheter is ready for immediate use.   Original Report Authenticated By: Aletta Edouard, M.D.      ASSESSMENT / PLAN:  Hemoptysis in setting of b/l pulmonary infiltrates with hx of Wegener's granulomatosis. Plan: Continue solumedrol Cytoxan added 4/12 F/u CXR 4/13 Oxygen as needed F/u ANCA Infection less likely >> monitor off Abx  Acute renal failure in setting of Wegener's. Plan: Per renal F/u hepatitis panel  Hx of PPD positive. Plan: INH per primary team F/u Quantiferon gold  Hx of HTN, VF s/p AICD. Plan: Per primary team  Steroid induced hyperglycemia. Plan: SSI while on solumedrol  Anemia >> s/p PRBC 4/11. Plan: F/u CBC Transfuse for Hb < 7  Chesley Mires, MD [place] 02/06/2013, 1:50 PM Pager:  [phone] After 3pm call: [phone]

## 2013-02-06 NOTE — Progress Notes (Signed)
[facility] PROGRESS NOTE  [name] MD:[id number] DOB: 1966-03-06 DOA: 02/04/2013 PCP: [name], MD  Assessment/Plan: 1. Wegener granulomatosis with epistaxis, alveolar hemorrhage and renal failure- fulminant - plan for HD, intubation mechanical ventilation if needed, steroids iv and pulse dose cyclophosphamide 500 mg iv once  after HD 4/12 with the main goal to help control the alveolar hemorrhage.  2. Latent TB - continue isoniazide and B 6 3. Received  empiric anitbiotic for 48 hrs until it was clear that he does not have an infectious process.  4. Acute blood loss anemia due to alveolar hemorrhage -  transfused 1 unit of prbcs 02/05/13    Code Status: full code  Family Communication: brother  Disposition Plan: ?   Consultants:  Nephrology   Pulmonary   Procedures:  Hd catheter 02/05/13   Antibiotics: Vanc and Zosyn   HPI/Subjective: No further epistaxis this AM   Objective: Filed Vitals:   02/05/13 2246 02/05/13 2348 02/06/13 0400 02/06/13 0500  BP: 134/72 124/71 110/60   Pulse: 90 87    Temp: 98.1 F (36.7 C) 98.4 F (36.9 C) 97.8 F (36.6 C)   TempSrc: Oral Oral Oral   Resp: 16 16 16    Height:      Weight:    68.3 kg (150 lb 9.2 oz)  SpO2: 95% 95% 95%     Intake/Output Summary (Last 24 hours) at 02/06/13 0745 Last data filed at 02/06/13 0039  Gross per 24 hour  Intake 1846.5 ml  Output   3350 ml  Net -1503.5 ml   Filed Weights   02/05/13 1915 02/05/13 2147 02/06/13 0500  Weight: 71 kg (156 lb 8.4 oz) 68.7 kg (151 lb 7.3 oz) 68.3 kg (150 lb 9.2 oz)    Exam:   General:  Alert   Cardiovascular: regular  Respiratory: bilat crackles  Abdomen: soft, nt   Musculoskeletal: no edema     Data Reviewed: Basic Metabolic Panel:  Recent Labs Lab 02/04/13 2110 02/05/13 0710 02/06/13 0500  NA 133* 134* 135  K 5.7* 4.9 4.6  CL 103 104 99  CO2 15* 15* 19  GLUCOSE 124* 73 162*  BUN 91* 97* 72*  CREATININE 10.25*  10.71* 8.41*  CALCIUM 8.8 8.8 8.8  PHOS  --   --  6.3*   Liver Function Tests:  Recent Labs Lab 02/04/13 2110 02/05/13 0710 02/06/13 0500  AST 16 15  --   ALT <5 <5  --   ALKPHOS 52 48  --   BILITOT 0.5 0.8  --   PROT 7.1 6.6  --   ALBUMIN 2.8* 2.5* 2.6*   No results found for this basename: LIPASE, AMYLASE,  in the last 168 hours No results found for this basename: AMMONIA,  in the last 168 hours CBC:  Recent Labs Lab 02/04/13 2110 02/05/13 0710 02/06/13 0500  WBC 9.4 7.9 8.4  NEUTROABS 7.0 6.4  --   HGB 7.0* 6.9* 10.1*  HCT 20.2* 19.5* 27.5*  MCV 82.1 80.2 77.2*  PLT 296 256 261   Cardiac Enzymes: No results found for this basename: CKTOTAL, CKMB, CKMBINDEX, TROPONINI,  in the last 168 hours BNP (last 3 results) No results found for this basename: PROBNP,  in the last 8760 hours CBG:  Recent Labs Lab 02/05/13 0422 02/05/13 0800 02/05/13 1243 02/05/13 1540  GLUCAP 101* 93 124* 136*    Recent Results (from the past 240 hour(s))  MRSA PCR SCREENING     Status: None   Collection  Time    02/05/13  2:32 AM      Result Value Range Status   MRSA by PCR NEGATIVE  NEGATIVE Final   Comment:            The GeneXpert MRSA Assay (FDA     approved for NASAL specimens     only), is one component of a     comprehensive MRSA colonization     surveillance program. It is not     intended to diagnose MRSA     infection nor to guide or     monitor treatment for     MRSA infections.     Studies: Ct Abdomen Pelvis Wo Contrast  02/05/2013  *RADIOLOGY REPORT*  Clinical Data:  Productive cough, lower abdominal pain and lower leg pain; generalized weakness.  CT CHEST, ABDOMEN AND PELVIS WITHOUT CONTRAST  Technique:  Multidetector CT imaging of the chest, abdomen and pelvis was performed following the standard protocol without IV contrast.  Comparison:  Chest radiograph performed 02/04/2013  CT CHEST  Findings:  There is diffuse opacification involving both lungs, relatively  fluffy in appearance, with peripheral sparing.  This may reflect diffuse pneumonia or possibly pulmonary alveolar hemorrhage.  The appearance is somewhat less typical for pulmonary edema.  No pleural effusion or pneumothorax is seen.  No definite masses are identified.  The heart is borderline enlarged.  An AICD lead is noted ending at the right ventricle.  No mediastinal lymphadenopathy is seen.  No pericardial effusion is identified.  The great vessels are grossly unremarkable in appearance.  The thyroid gland is grossly unremarkable in appearance.  No axillary lymphadenopathy is seen.  No acute osseous abnormalities are identified.  IMPRESSION:  1.  Diffuse opacification involving both lungs, with peripheral sparing.  This may reflect diffuse pneumonia or possibly pulmonary alveolar hemorrhage.  The appearance is somewhat less typical for pulmonary edema, though it remains a possibility. 2.  Borderline cardiomegaly.  CT ABDOMEN AND PELVIS  Findings:  The liver and spleen are unremarkable in appearance. The gallbladder is within normal limits.  The pancreas is somewhat prominent, though this likely remains within normal limits.  The adrenal glands are unremarkable in appearance.  The kidneys are unremarkable in appearance.  There is no evidence of hydronephrosis.  No renal or ureteral stones are seen.  No perinephric stranding is appreciated.  No free fluid is identified.  The small bowel is unremarkable in appearance.  The stomach is within normal limits.  No acute vascular abnormalities are seen.  Mildly prominent periaortic nodes are seen, of uncertain significance.  The appendix appears grossly normal in caliber, without evidence for appendicitis.  Contrast progresses to the level of the rectum. The colon is unremarkable in appearance.  The bladder is moderately distended and grossly unremarkable in appearance; a small urachal remnant is incidentally noted.  The prostate remains normal in size.  No inguinal  lymphadenopathy is seen.  No acute osseous abnormalities are identified.  IMPRESSION:  1.  No acute abnormality seen within the abdomen or pelvis. 2.  Mildly prominent periaortic nodes, of uncertain significance.   Original Report Authenticated By: Santa Lighter, M.D.    Dg Chest 2 View  02/04/2013  *RADIOLOGY REPORT*  Clinical Data: Fever.  Hemoptysis.  CHEST - 2 VIEW  Comparison: Two-view chest 05/03/2012.  Findings: The heart size is exaggerated by low lung volumes.  A single lead AICD is stable.  Patchy interstitial and airspace disease is present bilaterally.  The visualized soft  tissues and bony thorax are unremarkable.  IMPRESSION:  1.  New patchy interstitial airspace disease.  Differential diagnosis includes edema, hemorrhage, or infection. 2.  Low lung volumes.   Original Report Authenticated By: San Morelle, M.D.    Ct Chest Wo Contrast  02/05/2013  *RADIOLOGY REPORT*  Clinical Data:  Productive cough, lower abdominal pain and lower leg pain; generalized weakness.  CT CHEST, ABDOMEN AND PELVIS WITHOUT CONTRAST  Technique:  Multidetector CT imaging of the chest, abdomen and pelvis was performed following the standard protocol without IV contrast.  Comparison:  Chest radiograph performed 02/04/2013  CT CHEST  Findings:  There is diffuse opacification involving both lungs, relatively fluffy in appearance, with peripheral sparing.  This may reflect diffuse pneumonia or possibly pulmonary alveolar hemorrhage.  The appearance is somewhat less typical for pulmonary edema.  No pleural effusion or pneumothorax is seen.  No definite masses are identified.  The heart is borderline enlarged.  An AICD lead is noted ending at the right ventricle.  No mediastinal lymphadenopathy is seen.  No pericardial effusion is identified.  The great vessels are grossly unremarkable in appearance.  The thyroid gland is grossly unremarkable in appearance.  No axillary lymphadenopathy is seen.  No acute osseous  abnormalities are identified.  IMPRESSION:  1.  Diffuse opacification involving both lungs, with peripheral sparing.  This may reflect diffuse pneumonia or possibly pulmonary alveolar hemorrhage.  The appearance is somewhat less typical for pulmonary edema, though it remains a possibility. 2.  Borderline cardiomegaly.  CT ABDOMEN AND PELVIS  Findings:  The liver and spleen are unremarkable in appearance. The gallbladder is within normal limits.  The pancreas is somewhat prominent, though this likely remains within normal limits.  The adrenal glands are unremarkable in appearance.  The kidneys are unremarkable in appearance.  There is no evidence of hydronephrosis.  No renal or ureteral stones are seen.  No perinephric stranding is appreciated.  No free fluid is identified.  The small bowel is unremarkable in appearance.  The stomach is within normal limits.  No acute vascular abnormalities are seen.  Mildly prominent periaortic nodes are seen, of uncertain significance.  The appendix appears grossly normal in caliber, without evidence for appendicitis.  Contrast progresses to the level of the rectum. The colon is unremarkable in appearance.  The bladder is moderately distended and grossly unremarkable in appearance; a small urachal remnant is incidentally noted.  The prostate remains normal in size.  No inguinal lymphadenopathy is seen.  No acute osseous abnormalities are identified.  IMPRESSION:  1.  No acute abnormality seen within the abdomen or pelvis. 2.  Mildly prominent periaortic nodes, of uncertain significance.   Original Report Authenticated By: Santa Lighter, M.D.    US Renal  02/05/2013  *RADIOLOGY REPORT*  Clinical Data: Renal failure  RENAL/URINARY TRACT ULTRASOUND  Technique: Renal ultrasound  Comparison:  None.  Findings: Both kidneys demonstrate normal echogenicity.  There is no cyst,  mass, stone or hydronephrosis.  Right kidney is 12 cm in length.  Left kidney is 10 cm in length.  Bladder is  normal.  IMPRESSION: No specific abnormalities.  There is mild renal size asymmetry. This is a nonspecific finding that can at times be related to vascular insufficiency.,   Original Report Authenticated By: Skipper Cliche, M.D.    Ir Fluoro Guide Cv Line Right  02/05/2013  *RADIOLOGY REPORT*  Clinical Data: Renal failure.  The patient requires a tunneled dialysis catheter for hemodialysis.  TUNNELED CENTRAL VENOUS HEMODIALYSIS  CATHETER PLACEMENT WITH ULTRASOUND AND FLUOROSCOPIC GUIDANCE  Sedation:  1.0 mg IV Versed; 75 mcg IV Fentanyl.  Total Moderate Sedation Time:  10 minutes.  Additional Medications:  1 gram IV Ancef.  As antibiotic prophylaxis, Ancef was ordered pre-procedure and administered intravenously within one hour of incision.  Fluoroscopy Time:  1.2 minutes.  Procedure:  The procedure, risks, benefits, and alternatives were explained to the patient.  Questions regarding the procedure were encouraged and answered.  The patient understands and consents to the procedure.  The right neck and chest were prepped with chlorhexidine in a sterile fashion, and a sterile drape was applied covering the operative field.  Maximum barrier sterile technique with sterile gowns and gloves were used for the procedure.  Local anesthesia was provided with 1% lidocaine.  After creating a small venotomy incision, a 21 gauge needle was advanced into the right internal jugular vein under direct, real- time ultrasound guidance.  Ultrasound image documentation was performed.  After securing guidewire access, an 8 Fr dilator was placed.  A J-wire was kinked to measure appropriate catheter length.  A Bard HemoSplit tunneled hemodialysis catheter measuring 19 cm from tip to cuff was chosen for placement.  This was tunneled in a retrograde fashion from the chest wall to the venotomy incision.  At the venotomy, serial dilatation was performed and a 16 Fr peel- away sheath was placed over a guidewire.  The catheter was then placed  through the sheath and the sheath removed.  Final catheter positioning was confirmed and documented with a fluoroscopic spot image.  The catheter was aspirated, flushed with saline, and injected with appropriate volume heparin dwells.  The venotomy incision was closed with subcuticular 4-0 Vicryl. Dermabond was applied to the incision.  The catheter exit site was secured with 0-Prolene retention sutures.  Complications: None.  No pneumothorax.  Findings:  After catheter placement, the tips lie in the right atrium.  The catheter aspirates normally and is ready for immediate use.  IMPRESSION:  Placement of tunneled hemodialysis catheter via the right internal jugular vein.  The catheter tips lie in the right atrium.  The catheter is ready for immediate use.   Original Report Authenticated By: Aletta Edouard, M.D.    Ir US Guide Vasc Access Right  02/05/2013  *RADIOLOGY REPORT*  Clinical Data: Renal failure.  The patient requires a tunneled dialysis catheter for hemodialysis.  TUNNELED CENTRAL VENOUS HEMODIALYSIS CATHETER PLACEMENT WITH ULTRASOUND AND FLUOROSCOPIC GUIDANCE  Sedation:  1.0 mg IV Versed; 75 mcg IV Fentanyl.  Total Moderate Sedation Time:  10 minutes.  Additional Medications:  1 gram IV Ancef.  As antibiotic prophylaxis, Ancef was ordered pre-procedure and administered intravenously within one hour of incision.  Fluoroscopy Time:  1.2 minutes.  Procedure:  The procedure, risks, benefits, and alternatives were explained to the patient.  Questions regarding the procedure were encouraged and answered.  The patient understands and consents to the procedure.  The right neck and chest were prepped with chlorhexidine in a sterile fashion, and a sterile drape was applied covering the operative field.  Maximum barrier sterile technique with sterile gowns and gloves were used for the procedure.  Local anesthesia was provided with 1% lidocaine.  After creating a small venotomy incision, a 21 gauge needle was  advanced into the right internal jugular vein under direct, real- time ultrasound guidance.  Ultrasound image documentation was performed.  After securing guidewire access, an 8 Fr dilator was placed.  A J-wire was kinked to measure appropriate  catheter length.  A Bard HemoSplit tunneled hemodialysis catheter measuring 19 cm from tip to cuff was chosen for placement.  This was tunneled in a retrograde fashion from the chest wall to the venotomy incision.  At the venotomy, serial dilatation was performed and a 16 Fr peel- away sheath was placed over a guidewire.  The catheter was then placed through the sheath and the sheath removed.  Final catheter positioning was confirmed and documented with a fluoroscopic spot image.  The catheter was aspirated, flushed with saline, and injected with appropriate volume heparin dwells.  The venotomy incision was closed with subcuticular 4-0 Vicryl. Dermabond was applied to the incision.  The catheter exit site was secured with 0-Prolene retention sutures.  Complications: None.  No pneumothorax.  Findings:  After catheter placement, the tips lie in the right atrium.  The catheter aspirates normally and is ready for immediate use.  IMPRESSION:  Placement of tunneled hemodialysis catheter via the right internal jugular vein.  The catheter tips lie in the right atrium.  The catheter is ready for immediate use.   Original Report Authenticated By: Aletta Edouard, M.D.     Scheduled Meds: . ceFEPime (MAXIPIME) IV  1 g Intravenous Q24H  . cyclophosphamide  500 mg Intravenous Once  . isoniazid  300 mg Oral Daily  . methylPREDNISolone (SOLU-MEDROL) injection  250 mg Intravenous Q6H  . pantoprazole (PROTONIX) IV  40 mg Intravenous QHS  . vitamin B-6  50 mg Oral Daily  . risperiDONE  0.5 mg Oral Daily  . sodium bicarbonate  650 mg Oral BID  . sodium chloride  3 mL Intravenous Q12H  . sodium chloride  3 mL Intravenous Q12H   Continuous Infusions: . sodium chloride Stopped  (02/06/13 0039)  . sodium chloride    . sodium chloride    . sodium chloride Stopped (02/06/13 0039)    Principal Problem:   Wegener's disease, pulmonary Active Problems:   Other primary cardiomyopathies   PPD positive   ARF (acute renal failure)   Anemia   Pneumonia   GI bleed   Metabolic acidosis      James Nicholson  [facility] Pager [phone]. If 7PM-7AM, please contact night-coverage at [email or web address], password [facility] 02/06/2013, 7:45 AM  LOS: 2 days

## 2013-02-06 NOTE — Progress Notes (Signed)
[facility] Rounding Note  Impression/Recommendations   47 yo [place] male with history of P-ANCA positive necrotizing GN in 2010, treated at that time with steroids and cytoxan with resolution, but with no renal followup, last creatinine July 2013 1.15 with several months of symptoms referable to urinary tract includeing "red urine" several months back, then "foamy since that time, who presents with severe renal failure, severe anemia, and report of hemoptysis with uremic symptoms.   1. Renal failure with h/o P-ANCA + necrotizing GN (Wegener's) and treatment with cytoxan/steroids 2010  Suspect recurrence of original disease but with this late presentation likelihood of any renal recovery would be extremely low and for that reason do not recommend rebiopsy or empiric retreatment with cytoxan for renal purposes.  Long term candidacy for dialysis is difficult to assess because of his living and psychiatric issues but his brother Evalee Jefferson) wants this done for him at this time Received 1st HD 4/11 (PC by IR 4/11) HD #2 for 4/12.  ANCA studies pending As above do not recommend rebiopsy or empiric cytoxan for renal issues but pulm indication (hemoptysis) warrants Vein mapping ordered  Notify [facility] for permanent access next week  CLIP for outpt dialysis (need to clarify what his living arrangements will be post D/C as unsure if group home will take him back on HD)   2) Hemoptysis in setting of prior + P-ANCA  Steroids/cytoxan (1st dose CTX  to be given today)   3) Anemia  Hb improved post transfusion Start Aranesp with HD   4)CKD-MBD  PTH pending   5) Schizophrenia  See's a Dr. Lovena Le at [place] (?) for med adj  Lives in [place]   6) H/O HTN not issue at present   7) H/O hyperlipidemia  Check lipid panel   8) ICD/Pacer in place  9) On INH rifampin Is this really for latent TB? (brother states pt received BCG vaccine, PPD has "always been positive" and group home would not take  him without INH) Wound quantiferon test be appropriate here(   Subjective: (Translation via brother Veljko) Still coughing Feels short of breath with deep breathing Knows hospital (not name), day of week Tolerated HD #1 and transfusion yesterday  Objective Vital signs in last 24 hours: Filed Vitals:   02/06/13 0400 02/06/13 0500 02/06/13 0700 02/06/13 0752  BP: 110/60   115/65  Pulse:      Temp: 97.8 F (36.6 C)  97.6 F (36.4 C)   TempSrc: Oral  Oral   Resp: 16     Height:      Weight:  68.3 kg (150 lb 9.2 oz)    SpO2: 95%      Weight change: -0.6 kg (-1 lb 5.2 oz)  Intake/Output Summary (Last 24 hours) at 02/06/13 0834 Last data filed at 02/06/13 0800  Gross per 24 hour  Intake 2021.5 ml  Output   3350 ml  Net -1328.5 ml   Physical Exam:  Blood pressure 115/65, pulse 87, temperature 97.6 F (36.4 C), temperature source Oral, resp. rate 16, height 5\' 6"  (1.676 m), weight 68.3 kg (150 lb 9.2 oz), SpO2 95.00%. Gen: Middle aged [place] male looking older than stated age Pleasant, communicating with brother well  Skin: no rash, cyanosis  Neck: Neck veins not vis  Chest: Clear anteriorly/coarse BS post/coughs with deep breathing  Heart: regular, no rub or gallop  Abdomen: soft,no focal tenerness  Ext: trace edema   Neuro: oriented to place, day of week  Labs: Basic  Metabolic Panel:  Recent Labs Lab 02/04/13 2110 02/05/13 0710 02/06/13 0500  NA 133* 134* 135  K 5.7* 4.9 4.6  CL 103 104 99  CO2 15* 15* 19  GLUCOSE 124* 73 162*  BUN 91* 97* 72*  CREATININE 10.25* 10.71* 8.41*  CALCIUM 8.8 8.8 8.8  PHOS  --   --  6.3*   Liver Function Tests:  Recent Labs Lab 02/04/13 2110 02/05/13 0710 02/06/13 0500  AST 16 15  --   ALT <5 <5  --   ALKPHOS 52 48  --   BILITOT 0.5 0.8  --   PROT 7.1 6.6  --   ALBUMIN 2.8* 2.5* 2.6*    Recent Labs Lab 02/04/13 2110 02/05/13 0710 02/06/13 0500  WBC 9.4 7.9 8.4  NEUTROABS 7.0 6.4  --   HGB 7.0* 6.9* 10.1*   HCT 20.2* 19.5* 27.5*  MCV 82.1 80.2 77.2*  PLT 296 256 261    Recent Labs Lab 02/05/13 0422 02/05/13 0800 02/05/13 1243 02/05/13 1540 02/06/13 0756  GLUCAP 101* 93 124* 136* 141*    Iron Studies:  Recent Labs Lab 02/05/13 1900  IRON 82  TIBC 162*  FERRITIN 1377*   Studies/Results: Ct Abdomen Pelvis Wo Contrast  02/05/2013  *RADIOLOGY REPORT*  Clinical Data:  Productive cough, lower abdominal pain and lower leg pain; generalized weakness.  CT CHEST, ABDOMEN AND PELVIS WITHOUT CONTRAST  Technique:  Multidetector CT imaging of the chest, abdomen and pelvis was performed following the standard protocol without IV contrast.  Comparison:  Chest radiograph performed 02/04/2013  CT CHEST  Findings:  There is diffuse opacification involving both lungs, relatively fluffy in appearance, with peripheral sparing.  This may reflect diffuse pneumonia or possibly pulmonary alveolar hemorrhage.  The appearance is somewhat less typical for pulmonary edema.  No pleural effusion or pneumothorax is seen.  No definite masses are identified.  The heart is borderline enlarged.  An AICD lead is noted ending at the right ventricle.  No mediastinal lymphadenopathy is seen.  No pericardial effusion is identified.  The great vessels are grossly unremarkable in appearance.  The thyroid gland is grossly unremarkable in appearance.  No axillary lymphadenopathy is seen.  No acute osseous abnormalities are identified.  IMPRESSION:  1.  Diffuse opacification involving both lungs, with peripheral sparing.  This may reflect diffuse pneumonia or possibly pulmonary alveolar hemorrhage.  The appearance is somewhat less typical for pulmonary edema, though it remains a possibility. 2.  Borderline cardiomegaly.  CT ABDOMEN AND PELVIS  Findings:  The liver and spleen are unremarkable in appearance. The gallbladder is within normal limits.  The pancreas is somewhat prominent, though this likely remains within normal limits.  The  adrenal glands are unremarkable in appearance.  The kidneys are unremarkable in appearance.  There is no evidence of hydronephrosis.  No renal or ureteral stones are seen.  No perinephric stranding is appreciated.  No free fluid is identified.  The small bowel is unremarkable in appearance.  The stomach is within normal limits.  No acute vascular abnormalities are seen.  Mildly prominent periaortic nodes are seen, of uncertain significance.  The appendix appears grossly normal in caliber, without evidence for appendicitis.  Contrast progresses to the level of the rectum. The colon is unremarkable in appearance.  The bladder is moderately distended and grossly unremarkable in appearance; a small urachal remnant is incidentally noted.  The prostate remains normal in size.  No inguinal lymphadenopathy is seen.  No acute osseous abnormalities are  identified.  IMPRESSION:  1.  No acute abnormality seen within the abdomen or pelvis. 2.  Mildly prominent periaortic nodes, of uncertain significance.   Original Report Authenticated By: Santa Lighter, M.D.    Dg Chest 2 View  02/04/2013  *RADIOLOGY REPORT*  Clinical Data: Fever.  Hemoptysis.  CHEST - 2 VIEW  Comparison: Two-view chest 05/03/2012.  Findings: The heart size is exaggerated by low lung volumes.  A single lead AICD is stable.  Patchy interstitial and airspace disease is present bilaterally.  The visualized soft tissues and bony thorax are unremarkable.  IMPRESSION:  1.  New patchy interstitial airspace disease.  Differential diagnosis includes edema, hemorrhage, or infection. 2.  Low lung volumes.   Original Report Authenticated By: San Morelle, M.D.    Ct Chest Wo Contrast  02/05/2013  *RADIOLOGY REPORT*  Clinical Data:  Productive cough, lower abdominal pain and lower leg pain; generalized weakness.  CT CHEST, ABDOMEN AND PELVIS WITHOUT CONTRAST  Technique:  Multidetector CT imaging of the chest, abdomen and pelvis was performed following the  standard protocol without IV contrast.  Comparison:  Chest radiograph performed 02/04/2013  CT CHEST  Findings:  There is diffuse opacification involving both lungs, relatively fluffy in appearance, with peripheral sparing.  This may reflect diffuse pneumonia or possibly pulmonary alveolar hemorrhage.  The appearance is somewhat less typical for pulmonary edema.  No pleural effusion or pneumothorax is seen.  No definite masses are identified.  The heart is borderline enlarged.  An AICD lead is noted ending at the right ventricle.  No mediastinal lymphadenopathy is seen.  No pericardial effusion is identified.  The great vessels are grossly unremarkable in appearance.  The thyroid gland is grossly unremarkable in appearance.  No axillary lymphadenopathy is seen.  No acute osseous abnormalities are identified.  IMPRESSION:  1.  Diffuse opacification involving both lungs, with peripheral sparing.  This may reflect diffuse pneumonia or possibly pulmonary alveolar hemorrhage.  The appearance is somewhat less typical for pulmonary edema, though it remains a possibility. 2.  Borderline cardiomegaly.  CT ABDOMEN AND PELVIS  Findings:  The liver and spleen are unremarkable in appearance. The gallbladder is within normal limits.  The pancreas is somewhat prominent, though this likely remains within normal limits.  The adrenal glands are unremarkable in appearance.  The kidneys are unremarkable in appearance.  There is no evidence of hydronephrosis.  No renal or ureteral stones are seen.  No perinephric stranding is appreciated.  No free fluid is identified.  The small bowel is unremarkable in appearance.  The stomach is within normal limits.  No acute vascular abnormalities are seen.  Mildly prominent periaortic nodes are seen, of uncertain significance.  The appendix appears grossly normal in caliber, without evidence for appendicitis.  Contrast progresses to the level of the rectum. The colon is unremarkable in appearance.   The bladder is moderately distended and grossly unremarkable in appearance; a small urachal remnant is incidentally noted.  The prostate remains normal in size.  No inguinal lymphadenopathy is seen.  No acute osseous abnormalities are identified.  IMPRESSION:  1.  No acute abnormality seen within the abdomen or pelvis. 2.  Mildly prominent periaortic nodes, of uncertain significance.   Original Report Authenticated By: Santa Lighter, M.D.    US Renal  02/05/2013  *RADIOLOGY REPORT*  Clinical Data: Renal failure  RENAL/URINARY TRACT ULTRASOUND  Technique: Renal ultrasound  Comparison:  None.  Findings: Both kidneys demonstrate normal echogenicity.  There is no cyst,  mass,  stone or hydronephrosis.  Right kidney is 12 cm in length.  Left kidney is 10 cm in length.  Bladder is normal.  IMPRESSION: No specific abnormalities.  There is mild renal size asymmetry. This is a nonspecific finding that can at times be related to vascular insufficiency.,   Original Report Authenticated By: Skipper Cliche, M.D.    Ir Fluoro Guide Cv Line Right  02/05/2013  *RADIOLOGY REPORT*  Clinical Data: Renal failure.  The patient requires a tunneled dialysis catheter for hemodialysis.  TUNNELED CENTRAL VENOUS HEMODIALYSIS CATHETER PLACEMENT WITH ULTRASOUND AND FLUOROSCOPIC GUIDANCE  Sedation:  1.0 mg IV Versed; 75 mcg IV Fentanyl.  Total Moderate Sedation Time:  10 minutes.  Additional Medications:  1 gram IV Ancef.  As antibiotic prophylaxis, Ancef was ordered pre-procedure and administered intravenously within one hour of incision.  Fluoroscopy Time:  1.2 minutes.  Procedure:  The procedure, risks, benefits, and alternatives were explained to the patient.  Questions regarding the procedure were encouraged and answered.  The patient understands and consents to the procedure.  The right neck and chest were prepped with chlorhexidine in a sterile fashion, and a sterile drape was applied covering the operative field.  Maximum barrier  sterile technique with sterile gowns and gloves were used for the procedure.  Local anesthesia was provided with 1% lidocaine.  After creating a small venotomy incision, a 21 gauge needle was advanced into the right internal jugular vein under direct, real- time ultrasound guidance.  Ultrasound image documentation was performed.  After securing guidewire access, an 8 Fr dilator was placed.  A J-wire was kinked to measure appropriate catheter length.  A Bard HemoSplit tunneled hemodialysis catheter measuring 19 cm from tip to cuff was chosen for placement.  This was tunneled in a retrograde fashion from the chest wall to the venotomy incision.  At the venotomy, serial dilatation was performed and a 16 Fr peel- away sheath was placed over a guidewire.  The catheter was then placed through the sheath and the sheath removed.  Final catheter positioning was confirmed and documented with a fluoroscopic spot image.  The catheter was aspirated, flushed with saline, and injected with appropriate volume heparin dwells.  The venotomy incision was closed with subcuticular 4-0 Vicryl. Dermabond was applied to the incision.  The catheter exit site was secured with 0-Prolene retention sutures.  Complications: None.  No pneumothorax.  Findings:  After catheter placement, the tips lie in the right atrium.  The catheter aspirates normally and is ready for immediate use.  IMPRESSION:  Placement of tunneled hemodialysis catheter via the right internal jugular vein.  The catheter tips lie in the right atrium.  The catheter is ready for immediate use.   Original Report Authenticated By: Aletta Edouard, M.D.    Ir US Guide Vasc Access Right  02/05/2013  *RADIOLOGY REPORT*  Clinical Data: Renal failure.  The patient requires a tunneled dialysis catheter for hemodialysis.  TUNNELED CENTRAL VENOUS HEMODIALYSIS CATHETER PLACEMENT WITH ULTRASOUND AND FLUOROSCOPIC GUIDANCE  Sedation:  1.0 mg IV Versed; 75 mcg IV Fentanyl.  Total Moderate  Sedation Time:  10 minutes.  Additional Medications:  1 gram IV Ancef.  As antibiotic prophylaxis, Ancef was ordered pre-procedure and administered intravenously within one hour of incision.  Fluoroscopy Time:  1.2 minutes.  Procedure:  The procedure, risks, benefits, and alternatives were explained to the patient.  Questions regarding the procedure were encouraged and answered.  The patient understands and consents to the procedure.  The right neck and  chest were prepped with chlorhexidine in a sterile fashion, and a sterile drape was applied covering the operative field.  Maximum barrier sterile technique with sterile gowns and gloves were used for the procedure.  Local anesthesia was provided with 1% lidocaine.  After creating a small venotomy incision, a 21 gauge needle was advanced into the right internal jugular vein under direct, real- time ultrasound guidance.  Ultrasound image documentation was performed.  After securing guidewire access, an 8 Fr dilator was placed.  A J-wire was kinked to measure appropriate catheter length.  A Bard HemoSplit tunneled hemodialysis catheter measuring 19 cm from tip to cuff was chosen for placement.  This was tunneled in a retrograde fashion from the chest wall to the venotomy incision.  At the venotomy, serial dilatation was performed and a 16 Fr peel- away sheath was placed over a guidewire.  The catheter was then placed through the sheath and the sheath removed.  Final catheter positioning was confirmed and documented with a fluoroscopic spot image.  The catheter was aspirated, flushed with saline, and injected with appropriate volume heparin dwells.  The venotomy incision was closed with subcuticular 4-0 Vicryl. Dermabond was applied to the incision.  The catheter exit site was secured with 0-Prolene retention sutures.  Complications: None.  No pneumothorax.  Findings:  After catheter placement, the tips lie in the right atrium.  The catheter aspirates normally and is  ready for immediate use.  IMPRESSION:  Placement of tunneled hemodialysis catheter via the right internal jugular vein.  The catheter tips lie in the right atrium.  The catheter is ready for immediate use.   Original Report Authenticated By: Aletta Edouard, M.D.    Medications: (Reviewed) . sodium chloride Stopped (02/06/13 0039)  . sodium chloride    . sodium chloride    . sodium chloride Stopped (02/06/13 0039)   . cyclophosphamide  500 mg Intravenous Once  . isoniazid  300 mg Oral Daily  . methylPREDNISolone (SOLU-MEDROL) injection  250 mg Intravenous Q6H  . pantoprazole (PROTONIX) IV  40 mg Intravenous QHS  . vitamin B-6  50 mg Oral Daily  . risperiDONE  0.5 mg Oral Daily  . sodium bicarbonate  650 mg Oral BID  . sodium chloride  3 mL Intravenous Q12H  . sodium chloride  3 mL Intravenous Q12H   Jamal Maes, MD [facility] [phone] pager 02/06/2013, 8:34 AM

## 2013-02-06 NOTE — Clinical Social Work Psychosocial (Signed)
Clinical Social Work Department BRIEF PSYCHOSOCIAL ASSESSMENT 02/06/2013  Patient:  James Nicholson, James Nicholson     Account Number:  [id number]     Admit date:  02/04/2013  Clinical Social Worker:  Leitha Schuller  Date/Time:  02/06/2013 04:54 PM  Referred by:  Care Management  Date Referred:  02/05/2013 Referred for  Other - See comment   Other Referral:   Interview type:   Other interview type:    PSYCHOSOCIAL DATA Living Status:  OTHER Admitted from facility:   Level of care:   Primary support name:  Veljiko Reedy Primary support relationship to patient:  SIBLING Degree of support available:   Adequate    CURRENT CONCERNS Current Concerns  Post-Acute Placement   Other Concerns:    SOCIAL WORK ASSESSMENT / PLAN CSW contacted patient's brother. Brother states that patient has been living at [facility] group home on [address] in [place]. The direct house number is [phone]. The owner is Carylon Perches, [phone]. Brother and caregiver would like patient to go back there upon discharge if possible.   Assessment/plan status:  Psychosocial Support/Ongoing Assessment of Needs Other assessment/ plan:   Information/referral to community resources:    PATIENT'S/FAMILY'S RESPONSE TO PLAN OF CARE: Patient's brother thanked CSW for contacting him and providing support regarding brother's recent hospitalization. Weekday CSW will continue to follow and assist as needed.    Patrick Jupiter, [place] (weekend)

## 2013-02-06 NOTE — Progress Notes (Signed)
Dr. Marye Round notified of increasing cbgs. Orders received.  Will continue to monitor pt closely.

## 2013-02-07 ENCOUNTER — Inpatient Hospital Stay (HOSPITAL_COMMUNITY): Payer: Medicaid Other

## 2013-02-07 DIAGNOSIS — N19 Unspecified kidney failure: Secondary | ICD-10-CM

## 2013-02-07 LAB — RENAL FUNCTION PANEL
Albumin: 2.5 g/dL — ABNORMAL LOW (ref 3.5–5.2)
BUN: 56 mg/dL — ABNORMAL HIGH (ref 6–23)
CO2: 24 mEq/L (ref 19–32)
Calcium: 8.5 mg/dL (ref 8.4–10.5)
Chloride: 100 mEq/L (ref 96–112)
Creatinine, Ser: 6.07 mg/dL — ABNORMAL HIGH (ref 0.50–1.35)
GFR calc Af Amer: 12 mL/min — ABNORMAL LOW (ref >90)
GFR calc non Af Amer: 10 mL/min — ABNORMAL LOW (ref >90)
Glucose, Bld: 132 mg/dL — ABNORMAL HIGH (ref 70–99)
Phosphorus: 6 mg/dL — ABNORMAL HIGH (ref 2.3–4.6)
Potassium: 4.1 mEq/L (ref 3.5–5.1)
Sodium: 137 mEq/L (ref 135–145)

## 2013-02-07 LAB — CBC
HCT: 27 % — ABNORMAL LOW (ref 39.0–52.0)
Hemoglobin: 9.8 g/dL — ABNORMAL LOW (ref 13.0–17.0)
MCH: 28.4 pg (ref 26.0–34.0)
MCHC: 36.3 g/dL — ABNORMAL HIGH (ref 30.0–36.0)
MCV: 78.3 fL (ref 78.0–100.0)
Platelets: 267 10*3/uL (ref 150–400)
RBC: 3.45 MIL/uL — ABNORMAL LOW (ref 4.22–5.81)
RDW: 15.2 % (ref 11.5–15.5)
WBC: 10.7 10*3/uL — ABNORMAL HIGH (ref 4.0–10.5)

## 2013-02-07 LAB — GLUCOSE, CAPILLARY
Glucose-Capillary: 135 mg/dL — ABNORMAL HIGH (ref 70–99)
Glucose-Capillary: 138 mg/dL — ABNORMAL HIGH (ref 70–99)
Glucose-Capillary: 181 mg/dL — ABNORMAL HIGH (ref 70–99)

## 2013-02-07 LAB — HEPATITIS B SURFACE ANTIBODY,QUALITATIVE: Hep B S Ab: NONREACTIVE

## 2013-02-07 MED ORDER — PREDNISONE 50 MG PO TABS
60.0000 mg | ORAL_TABLET | Freq: Every day | ORAL | Status: DC
  Administered 2013-02-08 – 2013-02-11 (×4): 60 mg via ORAL
  Filled 2013-02-07 (×5): qty 1

## 2013-02-07 MED ORDER — LOPERAMIDE HCL 2 MG PO CAPS
2.0000 mg | ORAL_CAPSULE | ORAL | Status: DC | PRN
  Filled 2013-02-07: qty 1

## 2013-02-07 MED ORDER — PANTOPRAZOLE SODIUM 40 MG PO TBEC
40.0000 mg | DELAYED_RELEASE_TABLET | Freq: Every day | ORAL | Status: DC
  Administered 2013-02-07 – 2013-02-11 (×5): 40 mg via ORAL
  Filled 2013-02-07 (×5): qty 1

## 2013-02-07 NOTE — Progress Notes (Signed)
Pt arrived as a transfer from 2900. Alert and oriented. C/O mild headache. Skin intact. Pt speaks very little [place], brother is at bedside to translate. Pt oriented to unit. Bed in low position. Call bell within reach.

## 2013-02-07 NOTE — Progress Notes (Signed)
Report given to [facility]. Pt transported via w/c to room with Mickle Plumb.

## 2013-02-07 NOTE — Progress Notes (Addendum)
PULMONARY  / CRITICAL CARE MEDICINE  Name: Maddon Kinneman MRN: [id number] DOB: Feb 10, 1966    ADMISSION DATE:  02/04/2013 CONSULTATION DATE:  02/05/2013  REFERRING MD :  Hal Hope PRIMARY SERVICE: TRH  CHIEF COMPLAINT:  Weakness  BRIEF PATIENT DESCRIPTION: 47 y/o male with necrotizing granulomatosis was admitted on 4/10 for a flare of the same.  SIGNIFICANT EVENTS: 4/11 Admit, Abx, high dose solumedrol, renal consulted 4/12 Start cytoxan  STUDIES:  4/11 CT chest >> b/l diffuse fluffy ASD sparing periphery 4/11 ANCA >>  LINES / TUBES: Rt IJ HD cath 4/11 >>   CULTURES: 4/11 blood >> 4/11 urine >> 4/12 Quantiferon gold >>  ANTIBIOTICS: 4/11 cefepime >> 4/12 4/11 vanc >> 4/12  SUBJECTIVE:  Still has cough.  No further epistaxis.  VITAL SIGNS: Temp:  [97.1 F (36.2 C)-99.3 F (37.4 C)] 97.8 F (36.6 C) (04/13 0420) Pulse Rate:  [73-108] 85 (04/12 2209) Resp:  [12-26] 16 (04/13 0420) BP: (101-144)/(50-78) 110/55 mmHg (04/13 0420) SpO2:  [93 %-96 %] 95 % (04/13 0420) Weight:  [151 lb 3.8 oz (68.6 kg)-154 lb 15.7 oz (70.3 kg)] 151 lb 3.8 oz (68.6 kg) (04/13 0500) INTAKE / OUTPUT: Intake/Output     04/12 0701 - 04/13 0700 04/13 0701 - 04/14 0700   P.O. 1495    I.V. (mL/kg)     Blood     IV Piggyback     Total Intake(mL/kg) 1495 (21.8)    Urine (mL/kg/hr) 400 (0.2)    Other 1000 (0.6)    Total Output 1400     Net +95          Urine Occurrence 1 x    Stool Occurrence 2 x      PHYSICAL EXAMINATION:  Gen: No distress HEENT: no sinus tenderness PULM:  Scattered rhonchi CV: regular, no murmur AB: soft, non tender Ext: no edema Derm: no rashes Neuro: normal strength   LABS:  Recent Labs Lab 02/04/13 2110 02/04/13 2345 02/05/13 0146 02/05/13 0710 02/05/13 1216 02/06/13 0500 02/07/13 0455  HGB 7.0*  --   --  6.9*  --  10.1* 9.8*  WBC 9.4  --   --  7.9  --  8.4 10.7*  PLT 296  --   --  256  --  261 267  NA 133*  --   --  134*  --  135 137  K  5.7*  --   --  4.9  --  4.6 4.1  CL 103  --   --  104  --  99 100  CO2 15*  --   --  15*  --  19 24  GLUCOSE 124*  --   --  73  --  162* 132*  BUN 91*  --   --  97*  --  72* 56*  CREATININE 10.25*  --   --  10.71*  --  8.41* 6.07*  CALCIUM 8.8  --   --  8.8  --  8.8 8.5  PHOS  --   --   --   --   --  6.3* 6.0*  AST 16  --   --  15  --   --   --   ALT <5  --   --  <5  --   --   --   ALKPHOS 52  --   --  48  --   --   --   BILITOT 0.5  --   --  0.8  --   --   --  PROT 7.1  --   --  6.6  --   --   --   ALBUMIN 2.8*  --   --  2.5*  --  2.6* 2.5*  APTT  --   --   --   --  34  --   --   INR  --   --   --   --  1.30  --   --   LATICACIDVEN  --  0.7  --   --   --   --   --   PHART  --   --  7.289*  --   --   --   --   PCO2ART  --   --  29.0*  --   --   --   --   PO2ART  --   --  81.0  --   --   --   --     Recent Labs Lab 02/05/13 1540 02/06/13 0756 02/06/13 1133 02/06/13 1658 02/06/13 2238  GLUCAP 136* 141* 153* 168* 195*    Imaging: US Renal  02/05/2013  *RADIOLOGY REPORT*  Clinical Data: Renal failure  RENAL/URINARY TRACT ULTRASOUND  Technique: Renal ultrasound  Comparison:  None.  Findings: Both kidneys demonstrate normal echogenicity.  There is no cyst,  mass, stone or hydronephrosis.  Right kidney is 12 cm in length.  Left kidney is 10 cm in length.  Bladder is normal.  IMPRESSION: No specific abnormalities.  There is mild renal size asymmetry. This is a nonspecific finding that can at times be related to vascular insufficiency.,   Original Report Authenticated By: Skipper Cliche, M.D.    Ir Fluoro Guide Cv Line Right  02/05/2013  *RADIOLOGY REPORT*  Clinical Data: Renal failure.  The patient requires a tunneled dialysis catheter for hemodialysis.  TUNNELED CENTRAL VENOUS HEMODIALYSIS CATHETER PLACEMENT WITH ULTRASOUND AND FLUOROSCOPIC GUIDANCE  Sedation:  1.0 mg IV Versed; 75 mcg IV Fentanyl.  Total Moderate Sedation Time:  10 minutes.  Additional Medications:  1 gram IV Ancef.   As antibiotic prophylaxis, Ancef was ordered pre-procedure and administered intravenously within one hour of incision.  Fluoroscopy Time:  1.2 minutes.  Procedure:  The procedure, risks, benefits, and alternatives were explained to the patient.  Questions regarding the procedure were encouraged and answered.  The patient understands and consents to the procedure.  The right neck and chest were prepped with chlorhexidine in a sterile fashion, and a sterile drape was applied covering the operative field.  Maximum barrier sterile technique with sterile gowns and gloves were used for the procedure.  Local anesthesia was provided with 1% lidocaine.  After creating a small venotomy incision, a 21 gauge needle was advanced into the right internal jugular vein under direct, real- time ultrasound guidance.  Ultrasound image documentation was performed.  After securing guidewire access, an 8 Fr dilator was placed.  A J-wire was kinked to measure appropriate catheter length.  A Bard HemoSplit tunneled hemodialysis catheter measuring 19 cm from tip to cuff was chosen for placement.  This was tunneled in a retrograde fashion from the chest wall to the venotomy incision.  At the venotomy, serial dilatation was performed and a 16 Fr peel- away sheath was placed over a guidewire.  The catheter was then placed through the sheath and the sheath removed.  Final catheter positioning was confirmed and documented with a fluoroscopic spot image.  The catheter was aspirated, flushed with saline, and injected with appropriate volume heparin dwells.  The venotomy incision was closed with subcuticular 4-0 Vicryl. Dermabond was applied to the incision.  The catheter exit site was secured with 0-Prolene retention sutures.  Complications: None.  No pneumothorax.  Findings:  After catheter placement, the tips lie in the right atrium.  The catheter aspirates normally and is ready for immediate use.  IMPRESSION:  Placement of tunneled hemodialysis  catheter via the right internal jugular vein.  The catheter tips lie in the right atrium.  The catheter is ready for immediate use.   Original Report Authenticated By: Aletta Edouard, M.D.    Ir US Guide Vasc Access Right  02/05/2013  *RADIOLOGY REPORT*  Clinical Data: Renal failure.  The patient requires a tunneled dialysis catheter for hemodialysis.  TUNNELED CENTRAL VENOUS HEMODIALYSIS CATHETER PLACEMENT WITH ULTRASOUND AND FLUOROSCOPIC GUIDANCE  Sedation:  1.0 mg IV Versed; 75 mcg IV Fentanyl.  Total Moderate Sedation Time:  10 minutes.  Additional Medications:  1 gram IV Ancef.  As antibiotic prophylaxis, Ancef was ordered pre-procedure and administered intravenously within one hour of incision.  Fluoroscopy Time:  1.2 minutes.  Procedure:  The procedure, risks, benefits, and alternatives were explained to the patient.  Questions regarding the procedure were encouraged and answered.  The patient understands and consents to the procedure.  The right neck and chest were prepped with chlorhexidine in a sterile fashion, and a sterile drape was applied covering the operative field.  Maximum barrier sterile technique with sterile gowns and gloves were used for the procedure.  Local anesthesia was provided with 1% lidocaine.  After creating a small venotomy incision, a 21 gauge needle was advanced into the right internal jugular vein under direct, real- time ultrasound guidance.  Ultrasound image documentation was performed.  After securing guidewire access, an 8 Fr dilator was placed.  A J-wire was kinked to measure appropriate catheter length.  A Bard HemoSplit tunneled hemodialysis catheter measuring 19 cm from tip to cuff was chosen for placement.  This was tunneled in a retrograde fashion from the chest wall to the venotomy incision.  At the venotomy, serial dilatation was performed and a 16 Fr peel- away sheath was placed over a guidewire.  The catheter was then placed through the sheath and the sheath  removed.  Final catheter positioning was confirmed and documented with a fluoroscopic spot image.  The catheter was aspirated, flushed with saline, and injected with appropriate volume heparin dwells.  The venotomy incision was closed with subcuticular 4-0 Vicryl. Dermabond was applied to the incision.  The catheter exit site was secured with 0-Prolene retention sutures.  Complications: None.  No pneumothorax.  Findings:  After catheter placement, the tips lie in the right atrium.  The catheter aspirates normally and is ready for immediate use.  IMPRESSION:  Placement of tunneled hemodialysis catheter via the right internal jugular vein.  The catheter tips lie in the right atrium.  The catheter is ready for immediate use.   Original Report Authenticated By: Aletta Edouard, M.D.    Dg Chest Port 1 View  02/07/2013  *RADIOLOGY REPORT*  Clinical Data: Wegener's disease.  Cardiomyopathy.  Acute renal failure.  PORTABLE CHEST - 1 VIEW  Comparison: 02/04/2013  Findings: Cardiomegaly stable.  AICD remains in appropriate position.  A new right jugular dual lumen center venous dialysis catheter is seen in appropriate position.  No pneumothorax identified.  Mild improvement in patchy airspace disease is seen since previous study, consistent with decreased pulmonary edema.  No evidence of pleural effusion.  IMPRESSION:  1.  Mild improvement in patchy bilateral airspace disease/edema. 2.  Stable cardiomegaly.   Original Report Authenticated By: Earle Gell, M.D.      ASSESSMENT / PLAN:  Hemoptysis in setting of b/l pulmonary infiltrates with hx of Wegener's granulomatosis.  Likely also component of pulmonary edema >> improved after HD. Plan: Change solumedrol to prednisone 60 mg daily starting from 4/14 Cytoxan given 4/12 F/u CXR intermittently Oxygen as needed F/u ANCA Infection less likely >> monitor off Abx  Acute renal failure in setting of Wegener's. Plan: HD per renal F/u hepatitis panel  Hx of PPD  positive. Plan: INH per primary team F/u Quantiferon gold  Hx of HTN, VF s/p AICD. Plan: Per primary team  Steroid induced hyperglycemia. Plan: SSI while on solumedrol  Anemia >> s/p PRBC 4/11. Plan: F/u CBC Transfuse for Hb < 7  Updated family at bedside. Stable for transfer to telemetry from pulmonary standpoint.  Chesley Mires, MD [facility] Pulmonary/Critical Care 02/07/2013, 8:13 AM Pager:  [phone] After 3pm call: [phone]

## 2013-02-07 NOTE — Progress Notes (Signed)
[facility] Rounding Note  Impression/Recommendations   47 yo [place] male with history of P-ANCA positive necrotizing GN in 2010, treated at that time with steroids and cytoxan with resolution, but with no renal followup, last creatinine July 2013 1.15 with several months of symptoms referable to urinary tract includeing "red urine" several months back, then "foamy since that time, who presents with severe renal failure, severe anemia, hemoptysis with uremic symptoms.   Renal failure with h/o P-ANCA + necrotizing GN (Wegener's) and treatment with cytoxan/steroids 2010  Suspect recurrence of original disease but with this late presentation likelihood of any renal recovery would be extremely low and for that reason do not recommend rebiopsy or empiric retreatment with cytoxan for renal purposes.  Long term candidacy for dialysis is difficult to assess because of his living and psychiatric issues but his brother Marsa Aris) wants this done for him at this time PC by IR 4/11 Received HD #1 4/11, HD #2 4/12.  Next HD Monday 4/14 ANCA studies pending (ordered on admission) As above do not recommend rebiopsy or empiric cytoxan for renal issues given advanced stage at presentation and probably 4 month duration of symptoms with "red then foamy" urine but pulm indication (hemoptysis) warrants Vein mapping ordered  Notify [facility] for permanent access Monday  CLIP for outpt dialysis (need to clarify what his living arrangements will be post D/C as unsure if group home will take him back on HD)   2) Hemoptysis in setting of prior (and presume recurrent)  + P-ANCA/Wegeners  Steroids/cytoxan  1st dose CTX  500 mg IV 4/12 S/p solumedrol 1gm/day X 3 days   3) Anemia  Hb improved post transfusion Started Aranesp 100 with HD (4/12)  4)CKD-MBD  PTH ordered/pending   5) Schizophrenia  See's a Dr. Lovena Le at [place] (?) for med adj  Lives in [place]   6) H/O HTN not issue at present   7) H/O  hyperlipidemia   8) ICD/Pacer in place  44) On INH rifampin Is this really for latent TB? Brother states pt received BCG vaccine, PPD has "always been positive" and group home would not take him without INH) Quantiferon test ordered (for informational purposes)  Subjective: Brother Veljko not in the room so detailed information from pt could not be obtained Patient alert Still coughing but says "better" Told him next HD would be tomorrow and he seemed to understand that  Objective Vital signs in last 24 hours: Filed Vitals:   02/07/13 0848 02/07/13 0849 02/07/13 0853 02/07/13 1028  BP: 144/92   125/73  Pulse:  76  74  Temp: 97.3 F (36.3 C)   97.4 F (36.3 C)  TempSrc: Oral   Oral  Resp:   20   Height:      Weight:      SpO2: 95%      Weight change: -0.7 kg (-1 lb 8.7 oz)  Intake/Output Summary (Last 24 hours) at 02/07/13 1135 Last data filed at 02/06/13 2300  Gross per 24 hour  Intake    840 ml  Output   1400 ml  Net   -560 ml   Physical Exam:  Blood pressure 115/65, pulse 87, temperature 97.6 F (36.4 C), temperature source Oral, resp. rate 16, height 5\' 6"  (1.676 m), weight 68.3 kg (150 lb 9.2 oz), SpO2 95.00%. Gen: Middle aged [place] male sitting up in bed Pleasant, NAD Neck: Neck veins not vis  Chest: Tunnelled dialysis catheter with dry dressing in place right side/pacer ICD  on left side Clear anteriorly/coarse BS post/coughs with deep breathing  Heart: regular, no rub or gallop  Abdomen: soft,no focal tenerness  Ext: trace edema    Weight trending: 02/07/13 0500 68.6 kg  02/06/13 2140 69.1 kg  02/06/13 1819 70.3 kg   02/06/13 0500 68.3 kg   Labs: Basic Metabolic Panel:  Recent Labs Lab 02/04/13 2110 02/05/13 0710 02/06/13 0500 02/07/13 0455  NA 133* 134* 135 137  K 5.7* 4.9 4.6 4.1  CL 103 104 99 100  CO2 15* 15* 19 24  GLUCOSE 124* 73 162* 132*  BUN 91* 97* 72* 56*  CREATININE 10.25* 10.71* 8.41* 6.07*  CALCIUM 8.8 8.8 8.8 8.5  PHOS   --   --  6.3* 6.0*   Liver Function Tests:  Recent Labs Lab 02/04/13 2110 02/05/13 0710 02/06/13 0500 02/07/13 0455  AST 16 15  --   --   ALT <5 <5  --   --   ALKPHOS 52 48  --   --   BILITOT 0.5 0.8  --   --   PROT 7.1 6.6  --   --   ALBUMIN 2.8* 2.5* 2.6* 2.5*    Recent Labs Lab 02/04/13 2110 02/05/13 0710 02/06/13 0500 02/07/13 0455  WBC 9.4 7.9 8.4 10.7*  NEUTROABS 7.0 6.4  --   --   HGB 7.0* 6.9* 10.1* 9.8*  HCT 20.2* 19.5* 27.5* 27.0*  MCV 82.1 80.2 77.2* 78.3  PLT 296 256 261 267    Recent Labs Lab 02/06/13 0756 02/06/13 1133 02/06/13 1658 02/06/13 2238 02/07/13 0821  GLUCAP 141* 153* 168* 195* 135*    Iron Studies:   Recent Labs Lab 02/05/13 1900  IRON 82  TIBC 162*  FERRITIN 1377*   Studies/Results: US Renal  02/05/2013  *RADIOLOGY REPORT*  Clinical Data: Renal failure  RENAL/URINARY TRACT ULTRASOUND  Technique: Renal ultrasound  Comparison:  None.  Findings: Both kidneys demonstrate normal echogenicity.  There is no cyst,  mass, stone or hydronephrosis.  Right kidney is 12 cm in length.  Left kidney is 10 cm in length.  Bladder is normal.  IMPRESSION: No specific abnormalities.  There is mild renal size asymmetry. This is a nonspecific finding that can at times be related to vascular insufficiency.,   Original Report Authenticated By: Skipper Cliche, M.D.    Ir Fluoro Guide Cv Line Right  02/05/2013  *RADIOLOGY REPORT*  Clinical Data: Renal failure.  The patient requires a tunneled dialysis catheter for hemodialysis.  TUNNELED CENTRAL VENOUS HEMODIALYSIS CATHETER PLACEMENT WITH ULTRASOUND AND FLUOROSCOPIC GUIDANCE  Sedation:  1.0 mg IV Versed; 75 mcg IV Fentanyl.  Total Moderate Sedation Time:  10 minutes.  Additional Medications:  1 gram IV Ancef.  As antibiotic prophylaxis, Ancef was ordered pre-procedure and administered intravenously within one hour of incision.  Fluoroscopy Time:  1.2 minutes.  Procedure:  The procedure, risks, benefits, and  alternatives were explained to the patient.  Questions regarding the procedure were encouraged and answered.  The patient understands and consents to the procedure.  The right neck and chest were prepped with chlorhexidine in a sterile fashion, and a sterile drape was applied covering the operative field.  Maximum barrier sterile technique with sterile gowns and gloves were used for the procedure.  Local anesthesia was provided with 1% lidocaine.  After creating a small venotomy incision, a 21 gauge needle was advanced into the right internal jugular vein under direct, real- time ultrasound guidance.  Ultrasound image documentation was performed.  After securing guidewire access, an 8 Fr dilator was placed.  A J-wire was kinked to measure appropriate catheter length.  A Bard HemoSplit tunneled hemodialysis catheter measuring 19 cm from tip to cuff was chosen for placement.  This was tunneled in a retrograde fashion from the chest wall to the venotomy incision.  At the venotomy, serial dilatation was performed and a 16 Fr peel- away sheath was placed over a guidewire.  The catheter was then placed through the sheath and the sheath removed.  Final catheter positioning was confirmed and documented with a fluoroscopic spot image.  The catheter was aspirated, flushed with saline, and injected with appropriate volume heparin dwells.  The venotomy incision was closed with subcuticular 4-0 Vicryl. Dermabond was applied to the incision.  The catheter exit site was secured with 0-Prolene retention sutures.  Complications: None.  No pneumothorax.  Findings:  After catheter placement, the tips lie in the right atrium.  The catheter aspirates normally and is ready for immediate use.  IMPRESSION:  Placement of tunneled hemodialysis catheter via the right internal jugular vein.  The catheter tips lie in the right atrium.  The catheter is ready for immediate use.   Original Report Authenticated By: Aletta Edouard, M.D.    Ir US  Guide Vasc Access Right  02/05/2013  *RADIOLOGY REPORT*  Clinical Data: Renal failure.  The patient requires a tunneled dialysis catheter for hemodialysis.  TUNNELED CENTRAL VENOUS HEMODIALYSIS CATHETER PLACEMENT WITH ULTRASOUND AND FLUOROSCOPIC GUIDANCE  Sedation:  1.0 mg IV Versed; 75 mcg IV Fentanyl.  Total Moderate Sedation Time:  10 minutes.  Additional Medications:  1 gram IV Ancef.  As antibiotic prophylaxis, Ancef was ordered pre-procedure and administered intravenously within one hour of incision.  Fluoroscopy Time:  1.2 minutes.  Procedure:  The procedure, risks, benefits, and alternatives were explained to the patient.  Questions regarding the procedure were encouraged and answered.  The patient understands and consents to the procedure.  The right neck and chest were prepped with chlorhexidine in a sterile fashion, and a sterile drape was applied covering the operative field.  Maximum barrier sterile technique with sterile gowns and gloves were used for the procedure.  Local anesthesia was provided with 1% lidocaine.  After creating a small venotomy incision, a 21 gauge needle was advanced into the right internal jugular vein under direct, real- time ultrasound guidance.  Ultrasound image documentation was performed.  After securing guidewire access, an 8 Fr dilator was placed.  A J-wire was kinked to measure appropriate catheter length.  A Bard HemoSplit tunneled hemodialysis catheter measuring 19 cm from tip to cuff was chosen for placement.  This was tunneled in a retrograde fashion from the chest wall to the venotomy incision.  At the venotomy, serial dilatation was performed and a 16 Fr peel- away sheath was placed over a guidewire.  The catheter was then placed through the sheath and the sheath removed.  Final catheter positioning was confirmed and documented with a fluoroscopic spot image.  The catheter was aspirated, flushed with saline, and injected with appropriate volume heparin dwells.  The  venotomy incision was closed with subcuticular 4-0 Vicryl. Dermabond was applied to the incision.  The catheter exit site was secured with 0-Prolene retention sutures.  Complications: None.  No pneumothorax.  Findings:  After catheter placement, the tips lie in the right atrium.  The catheter aspirates normally and is ready for immediate use.  IMPRESSION:  Placement of tunneled hemodialysis catheter via the right internal jugular  vein.  The catheter tips lie in the right atrium.  The catheter is ready for immediate use.   Original Report Authenticated By: Aletta Edouard, M.D.    Dg Chest Port 1 View  02/07/2013  *RADIOLOGY REPORT*  Clinical Data: Wegener's disease.  Cardiomyopathy.  Acute renal failure.  PORTABLE CHEST - 1 VIEW  Comparison: 02/04/2013  Findings: Cardiomegaly stable.  AICD remains in appropriate position.  A new right jugular dual lumen center venous dialysis catheter is seen in appropriate position.  No pneumothorax identified.  Mild improvement in patchy airspace disease is seen since previous study, consistent with decreased pulmonary edema.  No evidence of pleural effusion.  IMPRESSION:  1.  Mild improvement in patchy bilateral airspace disease/edema. 2.  Stable cardiomegaly.   Original Report Authenticated By: Earle Gell, M.D.    Medications: (Reviewed) . sodium chloride Stopped (02/06/13 0039)  . sodium chloride Stopped (02/06/13 0039)   . darbepoetin (ARANESP) injection - DIALYSIS  100 mcg Intravenous Q Sat-HD  . insulin aspart  0-9 Units Subcutaneous TID WC  . insulin detemir  5 Units Subcutaneous QHS  . isoniazid  300 mg Oral Daily  . methylPREDNISolone (SOLU-MEDROL) injection  250 mg Intravenous Q6H  . pantoprazole  40 mg Oral Q1200  . [START ON 02/08/2013] predniSONE  60 mg Oral Q breakfast  . vitamin B-6  50 mg Oral Daily  . risperiDONE  0.5 mg Oral Daily  . sodium bicarbonate  650 mg Oral BID  . sodium chloride  3 mL Intravenous Q12H  . sodium chloride  3 mL  Intravenous Q12H   Jamal Maes, MD [facility] [phone] pager 02/07/2013, 11:35 AM

## 2013-02-07 NOTE — Evaluation (Signed)
Physical Therapy Evaluation Patient Details Name: James Nicholson MRN: [id number] DOB: [birth year] Today's Date: 02/07/2013 Time: [id number] PT Time Calculation (min): 9 min  PT Assessment / Plan / Recommendation Clinical Impression  47 y.o. male admitted to [facility] for cough and LE edema.  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  He presents today with generalized weakness and some mild difficulty walking (staggering gait pattern).  Depending on where he is discharging (SNF vs group home) he will need therapy f/u for balance and strength training.      PT Assessment  Patient needs continued PT services    Follow Up Recommendations  Home health PT;SNF;Other (comment) (not sure of where he came from he will need f/u)    Does the patient have the potential to tolerate intense rehabilitation     NA  Barriers to Discharge None None    Equipment Recommendations  None recommended by PT    Recommendations for Other Services   none  Frequency Min 3X/week    Precautions / Restrictions Precautions Precautions: Fall Precaution Comments: pt with staggering gait pattern   Pertinent Vitals/Pain See vitals flow sheet.       Mobility  Bed Mobility Bed Mobility: Supine to Sit;Sitting - Scoot to Edge of Bed Supine to Sit: With rails;HOB elevated;6: Modified independent (Device/Increase time) Sitting - Scoot to Edge of Bed: 6: Modified independent (Device/Increase time);With rail Details for Bed Mobility Assistance: Pt moved easily to EOB using railing Transfers Transfers: Sit to Stand;Stand to Sit Sit to Stand: 4: Min assist;With upper extremity assist;From bed Stand to Sit: 4: Min assist;With upper extremity assist;To bed Details for Transfer Assistance: min assist to steady pt for balance Ambulation/Gait Ambulation/Gait Assistance: 4: Min assist Ambulation Distance (Feet): 200 Feet Assistive device: 1 person hand held assist Ambulation/Gait Assistance  Details: min assist to steady pt for balance as he was staggering left and right.  Pt indicated that he was walking as if "drunk".   Gait Pattern: Step-through pattern;Narrow base of support (staggering left and right)        PT Diagnosis: Difficulty walking;Abnormality of gait;Generalized weakness  PT Problem List: Decreased strength;Decreased activity tolerance;Decreased balance;Decreased mobility;Pain PT Treatment Interventions: DME instruction;Gait training;Functional mobility training;Therapeutic activities;Therapeutic exercise;Balance training;Stair training;Neuromuscular re-education;Patient/family education   PT Goals Acute Rehab PT Goals PT Goal Formulation: Patient unable to participate in goal setting Time For Goal Achievement: 02/21/13 Potential to Achieve Goals: Good Pt will go Supine/Side to Sit: Independently;with HOB 0 degrees PT Goal: Supine/Side to Sit - Progress: Goal set today Pt will go Sit to Supine/Side: Independently;with HOB 0 degrees PT Goal: Sit to Supine/Side - Progress: Goal set today Pt will go Sit to Stand: with supervision PT Goal: Sit to Stand - Progress: Goal set today Pt will go Stand to Sit: with supervision PT Goal: Stand to Sit - Progress: Goal set today Pt will Ambulate: >150 feet;with supervision PT Goal: Ambulate - Progress: Goal set today  Visit Information  Last PT Received On: 02/07/13 Assistance Needed: +1    Subjective Data  Subjective: Pt does not speak much English.  Brother was not in room to translate.  [place]  Patient Stated Goal: "group home"   Prior Functioning  Home Living Additional Comments: need more information.  One place mentions SNF while another place in the chart mentions group home.   Prior Function Comments: unable to fully assess Communication Communication: Prefers language other than [place];Other (comment) (brother has  been translating, but is not present)     Cognition  Cognition Overall Cognitive Status: History of cognitive impairments - at baseline Cognition - Other Comments: lists schizophrenia     Extremity/Trunk Assessment Right Lower Extremity Assessment RLE ROM/Strength/Tone: Deficits RLE ROM/Strength/Tone Deficits: functionally seems weak with staggering gait pattern.   Left Lower Extremity Assessment LLE ROM/Strength/Tone: Deficits LLE ROM/Strength/Tone Deficits: functionally seems weak with staggering gait pattern.     Balance Static Sitting Balance Static Sitting - Balance Support: No upper extremity supported;Feet supported Static Sitting - Level of Assistance: 7: Independent Static Standing Balance Static Standing - Balance Support: Left upper extremity supported Static Standing - Level of Assistance: 4: Min assist  End of Session PT - End of Session Activity Tolerance: Patient limited by fatigue Patient left: in bed;with bed alarm set   James Nicholson B. [place], [place], DPT [phone]   02/07/2013, 3:15 PM

## 2013-02-07 NOTE — Progress Notes (Signed)
[facility] PROGRESS NOTE  James Hai MD:[id number] DOB: [birth year] DOA: 02/04/2013 PCP: [name], MD  Assessment/Plan: 1. Wegener granulomatosis with epistaxis, alveolar hemorrhage and renal failure- fulminant - plan for HD, intubation mechanical ventilation if needed, steroids iv and pulse dose cyclophosphamide 500 mg iv once  after HD 4/12 with the main goal to help control the alveolar hemorrhage.  2. Latent TB - continue isoniazide and B 6. Quantiferon sent out.  3. Received  empiric anitbiotic for 48 hrs until it was clear that he does not have an infectious process.  4. Acute blood loss anemia due to alveolar hemorrhage -  transfused 1 unit of prbcs 02/05/13  5. Mild diarrhea - probable from CTX - follow closely . Check  c diff - start imodium    Code Status: full code  Family Communication: brother  Disposition Plan: snf   Consultants:  Nephrology   Pulmonary   Procedures:  Hd catheter 02/05/13   Antibiotics: Vanc and Zosyn 4/11-4/12  HPI/Subjective: No further epistaxis this AM   Objective: Filed Vitals:   02/06/13 2209 02/06/13 2352 02/07/13 0420 02/07/13 0500  BP: 119/67 125/65 110/55   Pulse: 85     Temp: 97.8 F (36.6 C) 97.9 F (36.6 C) 97.8 F (36.6 C)   TempSrc: Oral Oral Oral   Resp: 17 16 16    Height:      Weight:    68.6 kg (151 lb 3.8 oz)  SpO2: 95% 94% 95%     Intake/Output Summary (Last 24 hours) at 02/07/13 0844 Last data filed at 02/06/13 2300  Gross per 24 hour  Intake   1320 ml  Output   1400 ml  Net    -80 ml   Filed Weights   02/06/13 1819 02/06/13 2140 02/07/13 0500  Weight: 70.3 kg (154 lb 15.7 oz) 69.1 kg (152 lb 5.4 oz) 68.6 kg (151 lb 3.8 oz)    Exam:   General:  Alert   Cardiovascular: regular  Respiratory: bilat crackles  Abdomen: soft, nt   Musculoskeletal: no edema     Data Reviewed: Basic Metabolic Panel:  Recent Labs Lab 02/04/13 2110 02/05/13 0710 02/06/13 0500  02/07/13 0455  NA 133* 134* 135 137  K 5.7* 4.9 4.6 4.1  CL 103 104 99 100  CO2 15* 15* 19 24  GLUCOSE 124* 73 162* 132*  BUN 91* 97* 72* 56*  CREATININE 10.25* 10.71* 8.41* 6.07*  CALCIUM 8.8 8.8 8.8 8.5  PHOS  --   --  6.3* 6.0*   Liver Function Tests:  Recent Labs Lab 02/04/13 2110 02/05/13 0710 02/06/13 0500 02/07/13 0455  AST 16 15  --   --   ALT <5 <5  --   --   ALKPHOS 52 48  --   --   BILITOT 0.5 0.8  --   --   PROT 7.1 6.6  --   --   ALBUMIN 2.8* 2.5* 2.6* 2.5*   No results found for this basename: LIPASE, AMYLASE,  in the last 168 hours No results found for this basename: AMMONIA,  in the last 168 hours CBC:  Recent Labs Lab 02/04/13 2110 02/05/13 0710 02/06/13 0500 02/07/13 0455  WBC 9.4 7.9 8.4 10.7*  NEUTROABS 7.0 6.4  --   --   HGB 7.0* 6.9* 10.1* 9.8*  HCT 20.2* 19.5* 27.5* 27.0*  MCV 82.1 80.2 77.2* 78.3  PLT 296 256 261 267   Cardiac Enzymes: No results found for this basename: CKTOTAL, CKMB,  CKMBINDEX, TROPONINI,  in the last 168 hours BNP (last 3 results) No results found for this basename: PROBNP,  in the last 8760 hours CBG:  Recent Labs Lab 02/06/13 0756 02/06/13 1133 02/06/13 1658 02/06/13 2238 02/07/13 0821  GLUCAP 141* 153* 168* 195* 135*    Recent Results (from the past 240 hour(s))  CULTURE, BLOOD (ROUTINE X 2)     Status: None   Collection Time    02/04/13 11:46 PM      Result Value Range Status   Specimen Description BLOOD RIGHT HAND   Final   Special Requests BOTTLES DRAWN AEROBIC ONLY 10CC   Final   Culture  Setup Time 02/05/2013 06:13   Final   Culture     Final   Value:        BLOOD CULTURE RECEIVED NO GROWTH TO DATE CULTURE WILL BE HELD FOR 5 DAYS BEFORE ISSUING A FINAL NEGATIVE REPORT   Report Status PENDING   Incomplete  CULTURE, BLOOD (ROUTINE X 2)     Status: None   Collection Time    02/04/13 11:56 PM      Result Value Range Status   Specimen Description BLOOD LEFT ARM   Final   Special Requests BOTTLES  DRAWN AEROBIC ONLY 10CC   Final   Culture  Setup Time 02/05/2013 06:13   Final   Culture     Final   Value:        BLOOD CULTURE RECEIVED NO GROWTH TO DATE CULTURE WILL BE HELD FOR 5 DAYS BEFORE ISSUING A FINAL NEGATIVE REPORT   Report Status PENDING   Incomplete  URINE CULTURE     Status: None   Collection Time    02/05/13  1:27 AM      Result Value Range Status   Specimen Description URINE, CLEAN CATCH   Final   Special Requests Normal   Final   Culture  Setup Time 02/05/2013 01:54   Final   Colony Count NO GROWTH   Final   Culture NO GROWTH   Final   Report Status 02/06/2013 FINAL   Final  MRSA PCR SCREENING     Status: None   Collection Time    02/05/13  2:32 AM      Result Value Range Status   MRSA by PCR NEGATIVE  NEGATIVE Final   Comment:            The GeneXpert MRSA Assay (FDA     approved for NASAL specimens     only), is one component of a     comprehensive MRSA colonization     surveillance program. It is not     intended to diagnose MRSA     infection nor to guide or     monitor treatment for     MRSA infections.     Studies: US Renal  02/05/2013  *RADIOLOGY REPORT*  Clinical Data: Renal failure  RENAL/URINARY TRACT ULTRASOUND  Technique: Renal ultrasound  Comparison:  None.  Findings: Both kidneys demonstrate normal echogenicity.  There is no cyst,  mass, stone or hydronephrosis.  Right kidney is 12 cm in length.  Left kidney is 10 cm in length.  Bladder is normal.  IMPRESSION: No specific abnormalities.  There is mild renal size asymmetry. This is a nonspecific finding that can at times be related to vascular insufficiency.,   Original Report Authenticated By: Skipper Cliche, M.D.    Ir Fluoro Guide Cv Line Right  02/05/2013  *RADIOLOGY REPORT*  Clinical Data: Renal failure.  The patient requires a tunneled dialysis catheter for hemodialysis.  TUNNELED CENTRAL VENOUS HEMODIALYSIS CATHETER PLACEMENT WITH ULTRASOUND AND FLUOROSCOPIC GUIDANCE  Sedation:  1.0 mg IV  Versed; 75 mcg IV Fentanyl.  Total Moderate Sedation Time:  10 minutes.  Additional Medications:  1 gram IV Ancef.  As antibiotic prophylaxis, Ancef was ordered pre-procedure and administered intravenously within one hour of incision.  Fluoroscopy Time:  1.2 minutes.  Procedure:  The procedure, risks, benefits, and alternatives were explained to the patient.  Questions regarding the procedure were encouraged and answered.  The patient understands and consents to the procedure.  The right neck and chest were prepped with chlorhexidine in a sterile fashion, and a sterile drape was applied covering the operative field.  Maximum barrier sterile technique with sterile gowns and gloves were used for the procedure.  Local anesthesia was provided with 1% lidocaine.  After creating a small venotomy incision, a 21 gauge needle was advanced into the right internal jugular vein under direct, real- time ultrasound guidance.  Ultrasound image documentation was performed.  After securing guidewire access, an 8 Fr dilator was placed.  A J-wire was kinked to measure appropriate catheter length.  A Bard HemoSplit tunneled hemodialysis catheter measuring 19 cm from tip to cuff was chosen for placement.  This was tunneled in a retrograde fashion from the chest wall to the venotomy incision.  At the venotomy, serial dilatation was performed and a 16 Fr peel- away sheath was placed over a guidewire.  The catheter was then placed through the sheath and the sheath removed.  Final catheter positioning was confirmed and documented with a fluoroscopic spot image.  The catheter was aspirated, flushed with saline, and injected with appropriate volume heparin dwells.  The venotomy incision was closed with subcuticular 4-0 Vicryl. Dermabond was applied to the incision.  The catheter exit site was secured with 0-Prolene retention sutures.  Complications: None.  No pneumothorax.  Findings:  After catheter placement, the tips lie in the right  atrium.  The catheter aspirates normally and is ready for immediate use.  IMPRESSION:  Placement of tunneled hemodialysis catheter via the right internal jugular vein.  The catheter tips lie in the right atrium.  The catheter is ready for immediate use.   Original Report Authenticated By: Aletta Edouard, M.D.    Ir US Guide Vasc Access Right  02/05/2013  *RADIOLOGY REPORT*  Clinical Data: Renal failure.  The patient requires a tunneled dialysis catheter for hemodialysis.  TUNNELED CENTRAL VENOUS HEMODIALYSIS CATHETER PLACEMENT WITH ULTRASOUND AND FLUOROSCOPIC GUIDANCE  Sedation:  1.0 mg IV Versed; 75 mcg IV Fentanyl.  Total Moderate Sedation Time:  10 minutes.  Additional Medications:  1 gram IV Ancef.  As antibiotic prophylaxis, Ancef was ordered pre-procedure and administered intravenously within one hour of incision.  Fluoroscopy Time:  1.2 minutes.  Procedure:  The procedure, risks, benefits, and alternatives were explained to the patient.  Questions regarding the procedure were encouraged and answered.  The patient understands and consents to the procedure.  The right neck and chest were prepped with chlorhexidine in a sterile fashion, and a sterile drape was applied covering the operative field.  Maximum barrier sterile technique with sterile gowns and gloves were used for the procedure.  Local anesthesia was provided with 1% lidocaine.  After creating a small venotomy incision, a 21 gauge needle was advanced into the right internal jugular vein under direct, real- time ultrasound guidance.  Ultrasound image documentation was performed.  After securing guidewire access, an 8 Fr dilator was placed.  A J-wire was kinked to measure appropriate catheter length.  A Bard HemoSplit tunneled hemodialysis catheter measuring 19 cm from tip to cuff was chosen for placement.  This was tunneled in a retrograde fashion from the chest wall to the venotomy incision.  At the venotomy, serial dilatation was performed and a  16 Fr peel- away sheath was placed over a guidewire.  The catheter was then placed through the sheath and the sheath removed.  Final catheter positioning was confirmed and documented with a fluoroscopic spot image.  The catheter was aspirated, flushed with saline, and injected with appropriate volume heparin dwells.  The venotomy incision was closed with subcuticular 4-0 Vicryl. Dermabond was applied to the incision.  The catheter exit site was secured with 0-Prolene retention sutures.  Complications: None.  No pneumothorax.  Findings:  After catheter placement, the tips lie in the right atrium.  The catheter aspirates normally and is ready for immediate use.  IMPRESSION:  Placement of tunneled hemodialysis catheter via the right internal jugular vein.  The catheter tips lie in the right atrium.  The catheter is ready for immediate use.   Original Report Authenticated By: Aletta Edouard, M.D.    Dg Chest Port 1 View  02/07/2013  *RADIOLOGY REPORT*  Clinical Data: Wegener's disease.  Cardiomyopathy.  Acute renal failure.  PORTABLE CHEST - 1 VIEW  Comparison: 02/04/2013  Findings: Cardiomegaly stable.  AICD remains in appropriate position.  A new right jugular dual lumen center venous dialysis catheter is seen in appropriate position.  No pneumothorax identified.  Mild improvement in patchy airspace disease is seen since previous study, consistent with decreased pulmonary edema.  No evidence of pleural effusion.  IMPRESSION:  1.  Mild improvement in patchy bilateral airspace disease/edema. 2.  Stable cardiomegaly.   Original Report Authenticated By: Earle Gell, M.D.     Scheduled Meds: . darbepoetin (ARANESP) injection - DIALYSIS  100 mcg Intravenous Q Sat-HD  . insulin aspart  0-9 Units Subcutaneous TID WC  . insulin detemir  5 Units Subcutaneous QHS  . isoniazid  300 mg Oral Daily  . methylPREDNISolone (SOLU-MEDROL) injection  250 mg Intravenous Q6H  . pantoprazole (PROTONIX) IV  40 mg Intravenous QHS   . [START ON 02/08/2013] predniSONE  60 mg Oral Q breakfast  . vitamin B-6  50 mg Oral Daily  . risperiDONE  0.5 mg Oral Daily  . sodium bicarbonate  650 mg Oral BID  . sodium chloride  3 mL Intravenous Q12H  . sodium chloride  3 mL Intravenous Q12H   Continuous Infusions: . sodium chloride Stopped (02/06/13 0039)  . sodium chloride Stopped (02/06/13 0039)    Principal Problem:   Wegener's disease, pulmonary Active Problems:   Other primary cardiomyopathies   PPD positive   ARF (acute renal failure)   Anemia   Pneumonia   GI bleed   Metabolic acidosis      James Nicholson  [facility] Pager [phone]. If 7PM-7AM, please contact night-coverage at [email or web address], password [facility] 02/07/2013, 8:44 AM  LOS: 3 days

## 2013-02-07 NOTE — Progress Notes (Signed)
VASCULAR LAB PRELIMINARY  PRELIMINARY  PRELIMINARY  PRELIMINARY  Right  Upper Extremity Vein Map    Cephalic  Segment Diameter Depth Comment  1. Axilla 2.11 mm mm   2. Mid upper arm 2 mm mm   3. Above AC 1.78 mm mm   4. In AC mm mm Thrombosed  5. Below AC 2.51 mm mm   6. Mid forearm 1.61 mm mm   7. Wrist 1.62 mm mm    mm mm    mm mm    mm mm    Basilic  Segment Diameter Depth Comment  1. Proximal upper arm 4.12 mm 8.21 mm   2. Mid upper arm 3.92 mm 8.73 mm   3. Above AC  mm mm   4. In AC 3.17 mm 4.67 mm   5. Below AC 3.01 mm mm   6. Mid forearm 1.61 mm mm   7. Wrist 1.67 mm mm    mm mm    mm mm    mm mm    Left Upper Extremity Vein Map    Cephalic  Segment Diameter Depth Comment  1. Axilla 1.89 mm mm   2. Mid upper arm 1.84 mm mm   3. Above AC 2.34 mm mm   4. In AC mm mm Thrombosed   5. Below AC 2 mm mm   6. Mid forearm 2.28 mm mm   7. Wrist 2.23 mm mm Wraps around to the side/back of arm   mm mm    mm mm    mm mm    Basilic  Segment Diameter Depth Comment  1. Axilla mm mm   2. Mid upper arm 3.34 mm 8.01 mm   3. Above AC 2.58 mm 6.29 mm   4. In AC 1.34 mm mm Multiple small branches through the forearm  5. Below AC mm mm   6. Mid forearm mm mm   7. Wrist mm mm    mm mm    mm mm    mm mm     Kimberlie Csaszar, RVT 02/07/2013, 9:49 AM

## 2013-02-08 DIAGNOSIS — N186 End stage renal disease: Secondary | ICD-10-CM

## 2013-02-08 LAB — RENAL FUNCTION PANEL
Albumin: 2.6 g/dL — ABNORMAL LOW (ref 3.5–5.2)
BUN: 93 mg/dL — ABNORMAL HIGH (ref 6–23)
CO2: 23 mEq/L (ref 19–32)
Calcium: 8.2 mg/dL — ABNORMAL LOW (ref 8.4–10.5)
Chloride: 99 mEq/L (ref 96–112)
Creatinine, Ser: 7.69 mg/dL — ABNORMAL HIGH (ref 0.50–1.35)
GFR calc Af Amer: 9 mL/min — ABNORMAL LOW (ref >90)
GFR calc non Af Amer: 8 mL/min — ABNORMAL LOW (ref >90)
Glucose, Bld: 127 mg/dL — ABNORMAL HIGH (ref 70–99)
Phosphorus: 7.5 mg/dL — ABNORMAL HIGH (ref 2.3–4.6)
Potassium: 4 mEq/L (ref 3.5–5.1)
Sodium: 138 mEq/L (ref 135–145)

## 2013-02-08 LAB — CBC
HCT: 27.9 % — ABNORMAL LOW (ref 39.0–52.0)
Hemoglobin: 10 g/dL — ABNORMAL LOW (ref 13.0–17.0)
MCH: 28.2 pg (ref 26.0–34.0)
MCHC: 35.8 g/dL (ref 30.0–36.0)
MCV: 78.8 fL (ref 78.0–100.0)
Platelets: 285 10*3/uL (ref 150–400)
RBC: 3.54 MIL/uL — ABNORMAL LOW (ref 4.22–5.81)
RDW: 15.4 % (ref 11.5–15.5)
WBC: 10.6 10*3/uL — ABNORMAL HIGH (ref 4.0–10.5)

## 2013-02-08 LAB — QUANTIFERON TB GOLD ASSAY (BLOOD)
Mitogen value: 0.16 IU/mL
Quantiferon Nil Value: 0.01 IU/mL
TB Ag value: 0.01 IU/mL
TB Antigen Minus Nil Value: 0 IU/mL

## 2013-02-08 LAB — GLUCOSE, CAPILLARY
Glucose-Capillary: 173 mg/dL — ABNORMAL HIGH (ref 70–99)
Glucose-Capillary: 126 mg/dL — ABNORMAL HIGH (ref 70–99)
Glucose-Capillary: 149 mg/dL — ABNORMAL HIGH (ref 70–99)
Glucose-Capillary: 122 mg/dL — ABNORMAL HIGH (ref 70–99)

## 2013-02-08 LAB — PTH, INTACT AND CALCIUM
Calcium, Total (PTH): 8.3 mg/dL — ABNORMAL LOW (ref 8.4–10.5)
PTH: 283.4 pg/mL — ABNORMAL HIGH (ref 14.0–72.0)

## 2013-02-08 MED ORDER — SULFAMETHOXAZOLE-TMP DS 800-160 MG PO TABS: 1.0000 | ORAL_TABLET | ORAL | Status: DC

## 2013-02-08 MED ORDER — SULFAMETHOXAZOLE-TRIMETHOPRIM 400-80 MG PO TABS
1.0000 | ORAL_TABLET | ORAL | Status: DC
  Administered 2013-02-08 – 2013-02-10 (×2): 1 via ORAL
  Filled 2013-02-08 (×2): qty 1

## 2013-02-08 NOTE — Consult Note (Addendum)
[facility] Consult  Reason for Consult:  Renal failure Referring Physician:  Florene Glen  [id number]  History of Present Illness: This is a 47 y.o. male who is [place] has a PMH that has bx proven P-ANCA positive necrotizing GN for which he was treated with IV cytoxan and steroids in [place] in 2010.  He subsequently relocated to [place] where his last labs 11/10 reveal a Cr of 1.04 and no proteinuria.  He has since relocated to [place] to be near his brother.  He resides in a group home b/c of inability to care for himself due to psychiatric issues (schizophrenia).  His last renal function test was 7/13 and Cr was 1.5 at that time.    The pt began to have leg swelling, cough and some hemoptysis.  He was brought to the ED and labs revealed a Cr of 10 as well as blood and protein in his urine as well as anemia and hyperkalemia.  Pulmonary is also consulted d/t bilateral pulmonary infiltrates with hx of Wegener's granulomatosis.  His pulmonary edema is improving with HD.  He has had a positive PPD in the past, but brother states this is a false positive as everything else has been negative and has been on INH b/c the group home he is living in would not take him unless he was on it.  Pt is in need of permanent access and [facility] is consulted.  Past Medical History  Diagnosis Date  . Hypertension   . Dyslipidemia   . Anemia   . Cellulitis   . Atrial fibrillation   . Enterobacter sepsis   . Schizophrenia    Past Surgical History  Procedure Laterality Date  . Cardiac defibrillator placement    . Pacemaker insertion      No Known Allergies  Prior to Admission medications   Medication Sig Start Date End Date Taking? Authorizing Provider  gabapentin (NEURONTIN) 300 MG capsule Take 300 mg by mouth 2 (two) times daily.   Yes Historical Provider, MD  isoniazid (NYDRAZID) 300 MG tablet Take 300 mg by mouth daily.   Yes Historical Provider, MD  naproxen (NAPROSYN) 500 MG tablet Take  500 mg by mouth 2 (two) times daily as needed (for pain).    Yes Historical Provider, MD  omeprazole (PRILOSEC) 20 MG capsule Take 20 mg by mouth daily.   Yes Historical Provider, MD  pyridOXINE (B-6) 50 MG tablet Take 1 tablet (50 mg total) by mouth daily. 05/22/12 05/22/13 Yes Thayer Headings, MD  risperiDONE (RISPERDAL) 0.5 MG tablet Take 0.5 mg by mouth daily.    Yes Historical Provider, MD  traMADol (ULTRAM) 50 MG tablet Take 50 mg by mouth every 6 (six) hours as needed for pain.   Yes Historical Provider, MD    History   Social History  . Marital Status: Single    Spouse Name: N/A    Number of Children: N/A  . Years of Education: N/A   Occupational History  . Not on file.   Social History Main Topics  . Smoking status: Former Smoker    Quit date: 10/28/2008  . Smokeless tobacco: Never Used  . Alcohol Use: No  . Drug Use: No  . Sexually Active: Not on file   Other Topics Concern  . Not on file   Social History Narrative  . No narrative on file    Family History  Problem Relation Age of Onset  . CAD Mother     ROS: [x]   Positive   [ ]  Negative   [ ]  All sytems reviewed and are negative Brother, who is at bedside states that it is hard to get pt to state how he is feeling and therefore, ROS is mostly obtained from the chart.  General: [ ]  Weight loss, [x ] Fever, [x ] chills; [x]  poor appetitie Neurologic: [ ]  Dizziness, [ ]  Blackouts, [ ]  Seizure [ ]  Stroke, [ ]  "Mini stroke", [ ]  Slurred speech, [ ]  Temporary blindness; [ ]  weakness in arms or legs, [ ]  Hoarseness Cardiac: [ ]  Chest pain/pressure, [ ]  Shortness of breath at rest  [ ]  Shortness of breath with exertion, [x ] Atrial fibrillation or irregular heartbeat; [x]  swelling of legs Vascular: [ ]  Pain in legs with walking, [ ]  Pain in legs at rest, [ ]  Pain in legs at night,  [ ]  Non-healing ulcer, [ ]  Blood clot in vein/DVT,   Pulmonary: [ ]  Home oxygen, [ ]  Productive cough, [x ] Coughing up blood,  [ ]  Asthma,  [ ]  Wheezing; [x]  positive PPD-on INH Musculoskeletal:  [ ]  Arthritis, [ ]  Low back pain, [ ]  Joint pain Hematologic: [ ]  Easy Bruising, [ x] Anemia; [ ]  Hepatitis Gastrointestinal: [ ]  Blood in stool, [ ]  Gastroesophageal Reflux/heartburn, [ ]  Trouble swallowing Urinary: [x ] chronic Kidney disease, [ ]  on HD - [ ]  MWF or [ ]  TTHS, [ ]  Burning with urination, [ ]  Difficulty urinating Endocrine: [ ]  hx of diabetes, [ ]  hx of thyroid disease Skin: [ ]  Rashes, [ ]  Wounds Psychological: [ ]  Anxiety, [ ]  Depression; [x]  schizophrenia    Physical Examination  Filed Vitals:   02/08/13 1016  BP: 107/53  Pulse: 70  Temp: 98.1 F (36.7 C)  Resp: 16   Body mass index is 25.17 kg/(m^2).  General:  WDWN in NAD Gait: Not observed HENT: WNL, normocephalic Eyes: Pupils equal Pulmonary: normal non-labored breathing , without Rales, rhonchi,  wheezing Cardiac: RRR, without  Murmurs, rubs or gallops; without carotid bruits Abdomen: soft, NT, no masses Skin: without rashes, without ulcers  Vascular Exam/Pulses:  + palpable radial pulses bilaterally; + palpable DP pulses bilaterally Extremities: without ischemic changes, without Gangrene , without cellulitis; without open wounds;  Musculoskeletal: no muscle wasting or atrophy  Neurologic: A&O X 3; Appropriate Affect ; SENSATION: normal; MOTOR FUNCTION:  moving all extremities equally. Speech is fluent/normal   CBC    Component Value Date/Time   WBC 10.6* 02/08/2013 0658   RBC 3.54* 02/08/2013 0658   HGB 10.0* 02/08/2013 0658   HCT 27.9* 02/08/2013 0658   PLT 285 02/08/2013 0658   MCV 78.8 02/08/2013 0658   MCH 28.2 02/08/2013 0658   MCHC 35.8 02/08/2013 0658   RDW 15.4 02/08/2013 0658   LYMPHSABS 0.8 02/05/2013 0710   MONOABS 0.3 02/05/2013 0710   EOSABS 0.4 02/05/2013 0710   BASOSABS 0.0 02/05/2013 0710    BMET    Component Value Date/Time   NA 138 02/08/2013 0658   K 4.0 02/08/2013 0658   CL 99 02/08/2013 0658   CO2 23 02/08/2013 0658    GLUCOSE 127* 02/08/2013 0658   BUN 93* 02/08/2013 0658   CREATININE 7.69* 02/08/2013 0658   CREATININE 1.15 05/22/2012 1052   CALCIUM 8.2* 02/08/2013 0658   GFRNONAA 8* 02/08/2013 0658   GFRAA 9* 02/08/2013 0658     Non-Invasive Vascular Imaging:  Vein mapping pending as it has been ordered   ASSESSMENT/PLAN: This is a  47 y.o. male Renal failure with h/o P-ANCA + necrotizing GN (Wegener's) now on HD and in need of permanent access -pt is right handed  -vein mapping is pending -will formulate plan once the vein mapping is done   Leontine Locket, PA-C [facility] [phone]  I have examined the patient, reviewed and agree with above.  Damin Salido, MD 02/08/2013 2:02 PM

## 2013-02-08 NOTE — Progress Notes (Addendum)
PULMONARY  / CRITICAL CARE MEDICINE  Name: James Nicholson MRN: [id number] DOB: Feb 26, 1966    ADMISSION DATE:  02/04/2013 CONSULTATION DATE:  02/05/2013  REFERRING MD :  Hal Hope PRIMARY SERVICE: TRH  CHIEF COMPLAINT:  Weakness  BRIEF PATIENT DESCRIPTION: 47 y/o male with P-ANCA + necrotizing GN was admitted on 4/10 for a flare of the same, characterized by renal failure and diffuse B infiltrates, epistaxis + hemoptysis  SIGNIFICANT EVENTS: 4/11 Admit, Abx, high dose solumedrol, renal consulted 4/12 Start cytoxan  STUDIES:  4/11 CT chest >> b/l diffuse fluffy ASD sparing periphery 4/11 ANCA >>  LINES / TUBES: Rt IJ HD cath 4/11 >>   CULTURES: 4/11 blood >> 4/11 urine >> 4/12 Quantiferon gold >>  ANTIBIOTICS: 4/11 cefepime >> 4/12 4/11 vanc >> 4/12  SUBJECTIVE:  Still has cough.  No further epistaxis.  Quant gold is pending  VITAL SIGNS: Temp:  [97.9 F (36.6 C)-98.2 F (36.8 C)] 98.1 F (36.7 C) (04/14 1016) Pulse Rate:  [59-79] 70 (04/14 1016) Resp:  [11-20] 16 (04/14 1016) BP: (105-121)/(49-71) 107/53 mmHg (04/14 1016) SpO2:  [96 %-97 %] 96 % (04/14 0700) Weight:  [70.7 kg (155 lb 13.8 oz)-72 kg (158 lb 11.7 oz)] 70.7 kg (155 lb 13.8 oz) (04/14 1016) INTAKE / OUTPUT: Intake/Output     04/13 0701 - 04/14 0700 04/14 0701 - 04/15 0700   P.O. 480    Total Intake(mL/kg) 480 (6.7)    Urine (mL/kg/hr)     Other  1000 (3.3)   Total Output   1000   Net +480 -1000        Urine Occurrence 1 x    Stool Occurrence 1 x      PHYSICAL EXAMINATION:  Gen: No distress HEENT: no sinus tenderness PULM:  Scattered rhonchi CV: regular, no murmur AB: soft, non tender Ext: no edema Derm: no rashes Neuro: normal strength   LABS:  Recent Labs Lab 02/04/13 2110 02/04/13 2345 02/05/13 0146 02/05/13 0710 02/05/13 1216 02/06/13 0500 02/07/13 0455 02/08/13 0658  HGB 7.0*  --   --  6.9*  --  10.1* 9.8* 10.0*  WBC 9.4  --   --  7.9  --  8.4 10.7* 10.6*  PLT  296  --   --  256  --  261 267 285  NA 133*  --   --  134*  --  135 137 138  K 5.7*  --   --  4.9  --  4.6 4.1 4.0  CL 103  --   --  104  --  99 100 99  CO2 15*  --   --  15*  --  19 24 23   GLUCOSE 124*  --   --  73  --  162* 132* 127*  BUN 91*  --   --  97*  --  72* 56* 93*  CREATININE 10.25*  --   --  10.71*  --  8.41* 6.07* 7.69*  CALCIUM 8.8  --   --  8.8  --  8.8 8.5 8.2*  PHOS  --   --   --   --   --  6.3* 6.0* 7.5*  AST 16  --   --  15  --   --   --   --   ALT <5  --   --  <5  --   --   --   --   ALKPHOS 52  --   --  48  --   --   --   --  BILITOT 0.5  --   --  0.8  --   --   --   --   PROT 7.1  --   --  6.6  --   --   --   --   ALBUMIN 2.8*  --   --  2.5*  --  2.6* 2.5* 2.6*  APTT  --   --   --   --  34  --   --   --   INR  --   --   --   --  1.30  --   --   --   LATICACIDVEN  --  0.7  --   --   --   --   --   --   PHART  --   --  7.289*  --   --   --   --   --   PCO2ART  --   --  29.0*  --   --   --   --   --   PO2ART  --   --  81.0  --   --   --   --   --     Recent Labs Lab 02/06/13 2238 02/07/13 0821 02/07/13 1212 02/07/13 1713 02/07/13 2110  GLUCAP 195* 135* 138* 181* 173*    Imaging: Dg Chest Port 1 View  02/07/2013  *RADIOLOGY REPORT*  Clinical Data: Wegener's disease.  Cardiomyopathy.  Acute renal failure.  PORTABLE CHEST - 1 VIEW  Comparison: 02/04/2013  Findings: Cardiomegaly stable.  AICD remains in appropriate position.  A new right jugular dual lumen center venous dialysis catheter is seen in appropriate position.  No pneumothorax identified.  Mild improvement in patchy airspace disease is seen since previous study, consistent with decreased pulmonary edema.  No evidence of pleural effusion.  IMPRESSION:  1.  Mild improvement in patchy bilateral airspace disease/edema. 2.  Stable cardiomegaly.   Original Report Authenticated By: Earle Gell, M.D.      ASSESSMENT / PLAN:  Hemoptysis in setting of b/l pulmonary infiltrates with hx of Wegener's  granulomatosis.  Likely also component of pulmonary edema >> improved after HD. Plan: Change solumedrol to prednisone 60 mg daily starting from 4/14 Cytoxan given 4/12 F/u CXR intermittently Oxygen as needed F/u ANCA Infection less likely >> agree w monitoring off Abx Add PCP prophylaxis on 4/14 >> Bactrim  Acute renal failure in setting of Wegener's. Plan: HD per renal F/u hepatitis panel  Hx of PPD positive. Plan: INH per primary team F/u Quantiferon gold >> if/when negative then can consider d/c altogether, although could also make an argument to treat 6 months for latent TB (ie exposure)  Hx of HTN, VF s/p AICD. Plan: Per primary team  Steroid induced hyperglycemia. Plan: SSI while on steroids   Anemia >> s/p PRBC 4/11. Plan: F/u CBC Transfuse for Hb < 7  Baltazar Apo, MD, PhD 02/08/2013, 11:21 AM [facility] [phone] or if no answer [phone]

## 2013-02-08 NOTE — Consult Note (Signed)
I have examined the patient, reviewed and agree with above. The patient was examined and need for surgery was discussed with the patient's brother present to translates. He is right-handed. He does have a current IV in his left cephalic vein at the antecubital space and also does have IV at the cephalic vein at the wrist. His right arm is being saved for access.  On physical exam he has excellent size cephalic vein at the right wrist and also has a normal 2-3+ right radial artery. We will review the vein map but suspect that he should have an excellent vein for fistula creation. We will plan for right arm AV fistula on 02/10/2013  Lily Velasquez, MD 02/08/2013 1:51 PM

## 2013-02-08 NOTE — Progress Notes (Signed)
[facility] PROGRESS NOTE  James Hai MD:[id number] DOB: 15-Nov-1965 DOA: 02/04/2013 PCP: James Market, MD  Assessment/Plan: 1. Wegener granulomatosis with epistaxis, alveolar hemorrhage and renal failure- fulminant - started  HD 02/05/13. Received  steroids iv pulse dose 1 g for 3 days  cyclophosphamide 500 mg iv once  after HD 4/12 with improvement in epistaxis and hemoptysis.  2. 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 Quantiferon sent out.  3. Received  empiric anitbiotic for 48 hrs until it was clear that he does not have an infectious process.  4. Acute blood loss anemia due to alveolar hemorrhage -  transfused 1 unit of prbcs 02/05/13  5. Mild diarrhea 4/13 - probable from CTX - follow closely . Check  c diff - start imodium    Code Status: full code  Family Communication: brother  Disposition Plan: snf   Consultants:  Nephrology   Pulmonary   Procedures:  Hd catheter 02/05/13   Antibiotics: Vanc and Zosyn 4/11-4/12  HPI/Subjective: Getting Hd without problems    Objective: Filed Vitals:   02/08/13 0642 02/08/13 0700 02/08/13 0730 02/08/13 0800  BP: 105/64 110/55 112/57 111/55  Pulse: 79 60 68 65  Temp:      TempSrc:      Resp: 14 16 14 17   Height:      Weight:      SpO2: 96% 96%      Intake/Output Summary (Last 24 hours) at 02/08/13 0831 Last data filed at 02/07/13 2140  Gross per 24 hour  Intake    480 ml  Output      0 ml  Net    480 ml   Filed Weights   02/07/13 0500 02/07/13 2113 02/08/13 0625  Weight: 68.6 kg (151 lb 3.8 oz) 71.2 kg (156 lb 15.5 oz) 72 kg (158 lb 11.7 oz)    Exam:   General:  Alert   Cardiovascular: regular, no M<R,G   Respiratory: no further  crackles  Abdomen: soft, nt   Musculoskeletal: no edema     Data Reviewed: Basic Metabolic Panel:  Recent Labs Lab 02/04/13 2110 02/05/13 0710 02/06/13 0500 02/07/13 0455 02/08/13 0658  NA 133* 134* 135 137 138  K 5.7* 4.9 4.6  4.1 4.0  CL 103 104 99 100 99  CO2 15* 15* 19 24 23   GLUCOSE 124* 73 162* 132* 127*  BUN 91* 97* 72* 56* 93*  CREATININE 10.25* 10.71* 8.41* 6.07* 7.69*  CALCIUM 8.8 8.8 8.8 8.5 8.2*  PHOS  --   --  6.3* 6.0* 7.5*   Liver Function Tests:  Recent Labs Lab 02/04/13 2110 02/05/13 0710 02/06/13 0500 02/07/13 0455 02/08/13 0658  AST 16 15  --   --   --   ALT <5 <5  --   --   --   ALKPHOS 52 48  --   --   --   BILITOT 0.5 0.8  --   --   --   PROT 7.1 6.6  --   --   --   ALBUMIN 2.8* 2.5* 2.6* 2.5* 2.6*   No results found for this basename: LIPASE, AMYLASE,  in the last 168 hours No results found for this basename: AMMONIA,  in the last 168 hours CBC:  Recent Labs Lab 02/04/13 2110 02/05/13 0710 02/06/13 0500 02/07/13 0455 02/08/13 0658  WBC 9.4 7.9 8.4 10.7* 10.6*  NEUTROABS 7.0 6.4  --   --   --   HGB 7.0*  6.9* 10.1* 9.8* 10.0*  HCT 20.2* 19.5* 27.5* 27.0* 27.9*  MCV 82.1 80.2 77.2* 78.3 78.8  PLT 296 256 261 267 285   Cardiac Enzymes: No results found for this basename: CKTOTAL, CKMB, CKMBINDEX, TROPONINI,  in the last 168 hours BNP (last 3 results) No results found for this basename: PROBNP,  in the last 8760 hours CBG:  Recent Labs Lab 02/06/13 1658 02/06/13 2238 02/07/13 0821 02/07/13 1212 02/07/13 1713  GLUCAP 168* 195* 135* 138* 181*    Recent Results (from the past 240 hour(s))  CULTURE, BLOOD (ROUTINE X 2)     Status: None   Collection Time    02/04/13 11:46 PM      Result Value Range Status   Specimen Description BLOOD RIGHT HAND   Final   Special Requests BOTTLES DRAWN AEROBIC ONLY 10CC   Final   Culture  Setup Time 02/05/2013 06:13   Final   Culture     Final   Value:        BLOOD CULTURE RECEIVED NO GROWTH TO DATE CULTURE WILL BE HELD FOR 5 DAYS BEFORE ISSUING A FINAL NEGATIVE REPORT   Report Status PENDING   Incomplete  CULTURE, BLOOD (ROUTINE X 2)     Status: None   Collection Time    02/04/13 11:56 PM      Result Value Range Status    Specimen Description BLOOD LEFT ARM   Final   Special Requests BOTTLES DRAWN AEROBIC ONLY 10CC   Final   Culture  Setup Time 02/05/2013 06:13   Final   Culture     Final   Value:        BLOOD CULTURE RECEIVED NO GROWTH TO DATE CULTURE WILL BE HELD FOR 5 DAYS BEFORE ISSUING A FINAL NEGATIVE REPORT   Report Status PENDING   Incomplete  URINE CULTURE     Status: None   Collection Time    02/05/13  1:27 AM      Result Value Range Status   Specimen Description URINE, CLEAN CATCH   Final   Special Requests Normal   Final   Culture  Setup Time 02/05/2013 01:54   Final   Colony Count NO GROWTH   Final   Culture NO GROWTH   Final   Report Status 02/06/2013 FINAL   Final  MRSA PCR SCREENING     Status: None   Collection Time    02/05/13  2:32 AM      Result Value Range Status   MRSA by PCR NEGATIVE  NEGATIVE Final   Comment:            The GeneXpert MRSA Assay (FDA     approved for NASAL specimens     only), is one component of a     comprehensive MRSA colonization     surveillance program. It is not     intended to diagnose MRSA     infection nor to guide or     monitor treatment for     MRSA infections.     Studies: Dg Chest Port 1 View  02/07/2013  *RADIOLOGY REPORT*  Clinical Data: Wegener's disease.  Cardiomyopathy.  Acute renal failure.  PORTABLE CHEST - 1 VIEW  Comparison: 02/04/2013  Findings: Cardiomegaly stable.  AICD remains in appropriate position.  A new right jugular dual lumen center venous dialysis catheter is seen in appropriate position.  No pneumothorax identified.  Mild improvement in patchy airspace disease is seen since previous study, consistent with  decreased pulmonary edema.  No evidence of pleural effusion.  IMPRESSION:  1.  Mild improvement in patchy bilateral airspace disease/edema. 2.  Stable cardiomegaly.   Original Report Authenticated By: James Nicholson, M.D.     Scheduled Meds: . darbepoetin (ARANESP) injection - DIALYSIS  100 mcg Intravenous Q Sat-HD  .  insulin aspart  0-9 Units Subcutaneous TID WC  . insulin detemir  5 Units Subcutaneous QHS  . isoniazid  300 mg Oral Daily  . pantoprazole  40 mg Oral Q1200  . predniSONE  60 mg Oral Q breakfast  . vitamin B-6  50 mg Oral Daily  . risperiDONE  0.5 mg Oral Daily  . sodium bicarbonate  650 mg Oral BID  . sodium chloride  3 mL Intravenous Q12H  . sodium chloride  3 mL Intravenous Q12H   Continuous Infusions: . sodium chloride Stopped (02/06/13 0039)  . sodium chloride Stopped (02/06/13 0039)    Principal Problem:   Wegener's disease, pulmonary Active Problems:   Other primary cardiomyopathies   PPD positive   ARF (acute renal failure)   Anemia   Pneumonia   GI bleed   Metabolic acidosis      James Nicholson  [facility] Pager [phone]. If 7PM-7AM, please contact night-coverage at [email or web address], password [email or web address] 02/08/2013, 8:31 AM  LOS: 4 days

## 2013-02-08 NOTE — Evaluation (Signed)
Clinical/Bedside Swallow Evaluation Patient Details  Name: James Nicholson MRN: [id number] Date of Birth: 1966-03-15  Today's Date: 02/08/2013 Time: [id number] SLP Time Calculation (min): 12 min  Past Medical History:  Past Medical History  Diagnosis Date  . Hypertension   . Dyslipidemia   . Anemia   . Cellulitis   . Atrial fibrillation   . Enterobacter sepsis   . Schizophrenia    Past Surgical History:  Past Surgical History  Procedure Laterality Date  . Cardiac defibrillator placement    . Pacemaker insertion     HPI:  47 y.o. male admitted to [facility] for cough and LE edema. 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    Assessment / Plan / Recommendation Clinical Impression  Patient presents with [facility] range of motion and strength of oral motor skills, he demonstrates timely swallow initiation with good hyo-laryngeal excursion and no overt s/s of aspiration.  As a result, it is recommended that he continue with his current diet orders and no skilled SLP services are warranted at this time.      Aspiration Risk  Mild    Diet Recommendation Regular;Thin liquid   Liquid Administration via: Cup;Straw Medication Administration: Whole meds with liquid Supervision: Patient able to self feed Compensations: Slow rate;Small sips/bites Postural Changes and/or Swallow Maneuvers: Seated upright 90 degrees    Other  Recommendations Oral Care Recommendations: Oral care BID   Follow Up Recommendations  None    Frequency and Duration  N/A     Pertinent Vitals/Pain N/A    Swallow Study Prior Functional Status   information not available     General HPI: 47 y.o. male admitted to [facility] for cough and LE edema. 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  Type of Study: Bedside swallow evaluation Previous Swallow Assessment: none  Diet Prior to this Study: Regular;Thin liquids;Other (Comment) (renal restrictions) Temperature  Spikes Noted: No Respiratory Status: Room air History of Recent Intubation: No Behavior/Cognition: Alert;Cooperative;Pleasant mood Oral Cavity - Dentition: Adequate natural dentition Self-Feeding Abilities: Able to feed self Patient Positioning: Upright in bed Baseline Vocal Quality: Clear Volitional Cough: Strong Volitional Swallow: Able to elicit    Oral/Motor/Sensory Function Overall Oral Motor/Sensory Function: Appears within functional limits for tasks assessed   Ice Chips Ice chips: Not tested   Thin Liquid Thin Liquid: Within functional limits Presentation: Cup;Straw;Self Fed Other Comments: occassional multiple swallows    Nectar Thick Nectar Thick Liquid: Not tested   Honey Thick Honey Thick Liquid: Not tested   Puree Puree: Within functional limits Presentation: Self Fed;Spoon   Solid   GO    Solid: Within functional limits Presentation: [address] M.A., CCC-SLP [phone]  James Nicholson 02/08/2013,4:29 PM

## 2013-02-08 NOTE — Procedures (Signed)
Hemodialysis in progress currently.  No hemodynamic issues.   Using perm cath. Will contact [facility] today to arrange permanent access.   Other issues as articulated below.  Impression/Recommendations  47 yo [place] male with history of P-ANCA positive necrotizing GN in 2010, treated at that time with steroids and cytoxan with resolution, but with no renal followup, last creatinine July 2013 1.15 with several months of symptoms referable to urinary tract includeing "red urine" several months back, then "foamy since that time, who presents with severe renal failure, severe anemia, hemoptysis with uremic symptoms.  Renal failure with h/o P-ANCA + necrotizing GN (Wegener's) and treatment with cytoxan/steroids 2010  Suspect recurrence of original disease but with this late presentation likelihood of any renal recovery would be extremely low and for that reason do not recommend rebiopsy or empiric retreatment with cytoxan for renal purposes.  Long term candidacy for dialysis is difficult to assess because of his living and psychiatric issues; but his brother Marsa Aris) wants this done for him at this time  PC by IR 4/11  Received HD #1 4/11, HD #2 4/12, #3 4/14  ANCA studies pending (ordered on admission)  Vein mapping ordered  CLIP for outpt dialysis (need to clarify what his living arrangements will be post D/C as unsure if group home will take him back on HD)  2) Hemoptysis in setting of prior (and presume recurrent) + P-ANCA/Wegeners  Steroids/cytoxan  1st dose CTX 500 mg IV 4/12  S/p solumedrol 1gm/day X 3 days  3) Anemia  Hb improved post transfusion  Started Aranesp 100 with HD (4/12)  4)CKD-MBD  PTH ordered/pending  5) Schizophrenia  See's a Dr. Lovena Le at [place] (?) for med adj  Lives in [place]  6) H/O HTN not issue at present  7) H/O hyperlipidemia  8) ICD/Pacer in place  59) On INH rifampin  Is this really for latent TB?  Brother states pt received BCG vaccine, PPD has "always been  positive" and group home would not take him without INH)  Quantiferon test ordered (for informational purposes)

## 2013-02-09 ENCOUNTER — Encounter (HOSPITAL_COMMUNITY): Payer: Self-pay | Admitting: Anesthesiology

## 2013-02-09 LAB — GLUCOSE, CAPILLARY
Glucose-Capillary: 111 mg/dL — ABNORMAL HIGH (ref 70–99)
Glucose-Capillary: 171 mg/dL — ABNORMAL HIGH (ref 70–99)
Glucose-Capillary: 154 mg/dL — ABNORMAL HIGH (ref 70–99)

## 2013-02-09 LAB — CBC
HCT: 31.4 % — ABNORMAL LOW (ref 39.0–52.0)
Hemoglobin: 11 g/dL — ABNORMAL LOW (ref 13.0–17.0)
MCH: 27.8 pg (ref 26.0–34.0)
MCHC: 35 g/dL (ref 30.0–36.0)
MCV: 79.3 fL (ref 78.0–100.0)
Platelets: 292 10*3/uL (ref 150–400)
RBC: 3.96 MIL/uL — ABNORMAL LOW (ref 4.22–5.81)
RDW: 14.8 % (ref 11.5–15.5)
WBC: 14.4 10*3/uL — ABNORMAL HIGH (ref 4.0–10.5)

## 2013-02-09 LAB — BASIC METABOLIC PANEL
BUN: 72 mg/dL — ABNORMAL HIGH (ref 6–23)
CO2: 24 mEq/L (ref 19–32)
Calcium: 8.4 mg/dL (ref 8.4–10.5)
Chloride: 96 mEq/L (ref 96–112)
Creatinine, Ser: 5.58 mg/dL — ABNORMAL HIGH (ref 0.50–1.35)
GFR calc Af Amer: 13 mL/min — ABNORMAL LOW (ref >90)
GFR calc non Af Amer: 11 mL/min — ABNORMAL LOW (ref >90)
Glucose, Bld: 115 mg/dL — ABNORMAL HIGH (ref 70–99)
Potassium: 3.5 mEq/L (ref 3.5–5.1)
Sodium: 136 mEq/L (ref 135–145)

## 2013-02-09 LAB — CLOSTRIDIUM DIFFICILE BY PCR: Toxigenic C. Difficile by PCR: NEGATIVE

## 2013-02-09 LAB — ANCA SCREEN W REFLEX TITER
Atypical p-ANCA Screen: NEGATIVE
c-ANCA Screen: POSITIVE — AB
p-ANCA Screen: NEGATIVE

## 2013-02-09 MED ORDER — DEXTROSE 5 % IV SOLN
1.5000 g | INTRAVENOUS | Status: AC
  Administered 2013-02-10: 1.5 g via INTRAVENOUS
  Filled 2013-02-09: qty 1.5

## 2013-02-09 MED ORDER — CALCIUM ACETATE 667 MG PO CAPS
667.0000 mg | ORAL_CAPSULE | Freq: Three times a day (TID) | ORAL | Status: DC
  Administered 2013-02-09 – 2013-02-11 (×3): 667 mg via ORAL
  Filled 2013-02-09 (×9): qty 1

## 2013-02-09 MED ORDER — RENA-VITE PO TABS
1.0000 | ORAL_TABLET | Freq: Every day | ORAL | Status: DC
  Administered 2013-02-09 – 2013-02-11 (×3): 1 via ORAL
  Filled 2013-02-09 (×3): qty 1

## 2013-02-09 NOTE — Progress Notes (Addendum)
PULMONARY  / CRITICAL CARE MEDICINE  Name: James Nicholson MRN: [id number] DOB: [birth year]    ADMISSION DATE:  02/04/2013 CONSULTATION DATE:  02/05/2013  REFERRING MD :  Hal Hope PRIMARY SERVICE: TRH  CHIEF COMPLAINT:  Weakness  BRIEF PATIENT DESCRIPTION: 47 y/o [place] male  with P-ANCA + necrotizing GN was admitted on 4/10 for a flare of the same, characterized by renal failure and diffuse B infiltrates, epistaxis + hemoptysis  SIGNIFICANT EVENTS: 4/11 Admit, Abx, high dose solumedrol, renal consulted 4/12 Start cytoxan  STUDIES:  4/11 CT chest >> b/l diffuse fluffy ASD sparing periphery 4/11 ANCA >>  LINES / TUBES: Rt IJ HD cath 4/11 >>   CULTURES: 4/11 blood >> 4/11 urine >>neg 4/12 Quantiferon gold >> indeterminate  ANTIBIOTICS: 4/11 cefepime >> 4/12 4/11 vanc >> 4/12  SUBJECTIVE:  Still has cough.  No further epistaxis.  Quant gold is 0.01  VITAL SIGNS: Temp:  [97.9 F (36.6 C)-98.7 F (37.1 C)] 98 F (36.7 C) (04/15 0750) Pulse Rate:  [64-86] 76 (04/15 0750) Resp:  [16] 16 (04/15 0750) BP: (112-132)/(59-76) 122/73 mmHg (04/15 0750) SpO2:  [95 %-98 %] 95 % (04/15 0750) Weight:  [155 lb 13.8 oz (70.7 kg)] 155 lb 13.8 oz (70.7 kg) (04/14 2159) INTAKE / OUTPUT: Intake/Output     04/14 0701 - 04/15 0700 04/15 0701 - 04/16 0700   P.O. 240 240   Total Intake(mL/kg) 240 (3.4) 240 (3.4)   Urine (mL/kg/hr) 500 (0.3) 320 (1.2)   Other 1000 (0.6)    Stool  1 (0)   Total Output 1500 321   Net -1260 -81          PHYSICAL EXAMINATION:  Gen: No distress, alert limited English speaking  male sitting in chair. HEENT: no sinus tenderness, no further epistaxis PULM:  Scattered rhonchi throughout, significant dry cough CV: regular, no murmur, 2+ pulses AB: soft, non tender Ext: no edema Derm: no rashes Neuro: normal strength x 4 extremities   LABS:  Recent Labs Lab 02/04/13 2110 02/04/13 2345 02/05/13 0146 02/05/13 0710 02/05/13 1216   02/06/13 0500 02/07/13 0455 02/08/13 0658 02/09/13 0720  HGB 7.0*  --   --  6.9*  --   --  10.1* 9.8* 10.0* 11.0*  WBC 9.4  --   --  7.9  --   --  8.4 10.7* 10.6* 14.4*  PLT 296  --   --  256  --   --  261 267 285 292  NA 133*  --   --  134*  --   --  135 137 138 136  K 5.7*  --   --  4.9  --   --  4.6 4.1 4.0 3.5  CL 103  --   --  104  --   --  99 100 99 96  CO2 15*  --   --  15*  --   --  19 24 23 24   GLUCOSE 124*  --   --  73  --   --  162* 132* 127* 115*  BUN 91*  --   --  97*  --   --  72* 56* 93* 72*  CREATININE 10.25*  --   --  10.71*  --   --  8.41* 6.07* 7.69* 5.58*  CALCIUM 8.8  --   --  8.8  --   < > 8.8 8.5 8.2* 8.4  PHOS  --   --   --   --   --   --  6.3* 6.0* 7.5*  --   AST 16  --   --  15  --   --   --   --   --   --   ALT <5  --   --  <5  --   --   --   --   --   --   ALKPHOS 52  --   --  48  --   --   --   --   --   --   BILITOT 0.5  --   --  0.8  --   --   --   --   --   --   PROT 7.1  --   --  6.6  --   --   --   --   --   --   ALBUMIN 2.8*  --   --  2.5*  --   --  2.6* 2.5* 2.6*  --   APTT  --   --   --   --  34  --   --   --   --   --   INR  --   --   --   --  1.30  --   --   --   --   --   LATICACIDVEN  --  0.7  --   --   --   --   --   --   --   --   PHART  --   --  7.289*  --   --   --   --   --   --   --   PCO2ART  --   --  29.0*  --   --   --   --   --   --   --   PO2ART  --   --  81.0  --   --   --   --   --   --   --   < > = values in this interval not displayed.  Recent Labs Lab 02/07/13 2110 02/08/13 1059 02/08/13 1629 02/08/13 2157 02/09/13 0804  GLUCAP 173* 126* 149* 122* 111*    Imaging: No results found.  ASSESSMENT / PLAN:  Hemoptysis in setting of b/l pulmonary infiltrates with hx of Wegener's granulomatosis.  Likely also component of pulmonary edema >> improved after HD. Plan: Changed solumedrol to prednisone 60 mg daily starting from 4/14 Cytoxan given 4/12 F/u CXR 4/16 RA 4/15 F/u ANCA >> pending Infection less likely >>  agree w monitoring off Abx Added PCP prophylaxis on 4/14 >> Bactrim  Acute renal failure in setting of Wegener's. Lab Results  Component Value Date   CREATININE 5.58* 02/09/2013   CREATININE 7.69* 02/08/2013   CREATININE 6.07* 02/07/2013   CREATININE 1.15 05/22/2012  Plan: HD per renal For [facility] per CVTS on 4/16. F/u hepatitis panel: negative for Hep B.  Hx of PPD positive. Plan: INH per primary team F/u Quantiferon gold >> if/when negative, then can consider d/c altogether, although could also make an argument to treat 6 months for latent TB (ie exposure)  Hx of HTN, VF s/p AICD. Plan: Per primary team  Steroid induced hyperglycemia. Plan: SSI while on steroids  Glycemic control adequate: 4/15  Anemia >> s/p PRBC 4/11.  Recent Labs  02/08/13 0658 02/09/13 0720  HGB 10.0* 11.0*   Plan: F/u CBC HGB 11.0: 4/15 Transfuse for Hb < 7  Sarah  Elie Confer, RN ACNP Student [employer] for Gaylyn Lambert, ACNP  Richardson Landry Minor ACNP Maryanna Shape PCCM Pager [phone] till 3 pm If no answer page [phone] 02/09/2013, 11:34 AM

## 2013-02-09 NOTE — Progress Notes (Signed)
Impression/Recommendations  47 yo [place] male  Renal failure with h/o P-ANCA + necrotizing GN (Wegener's) and treatment with cytoxan/steroids 2010; now with recurrence with pulmonary involvement and prob ESRD    Plan Access work up in progress.  Next HD Wed.  On Steroids and Cytoxan. Start MVI and binders.  Arrange OP hemodialysis.  Subjective: Interval History: none.  Objective: Vital signs in last 24 hours: Temp:  [97.9 F (36.6 C)-98.7 F (37.1 C)] 98 F (36.7 C) (04/15 0750) Pulse Rate:  [64-86] 76 (04/15 0750) Resp:  [16] 16 (04/15 0750) BP: (112-132)/(59-76) 122/73 mmHg (04/15 0750) SpO2:  [95 %-98 %] 95 % (04/15 0750) Weight:  [70.7 kg (155 lb 13.8 oz)] 70.7 kg (155 lb 13.8 oz) (04/14 2159) Weight change: -0.5 kg (-1 lb 1.6 oz)  Intake/Output from previous day: 04/14 0701 - 04/15 0700 In: 240 [P.O.:240] Out: 1500 [Urine:500] Intake/Output this shift: Total I/O In: 240 [P.O.:240] Out: 321 [Urine:320; Stool:1]  General appearance: alert and cooperative Head: Normocephalic, without obvious abnormality, atraumatic Extremities: extremities normal, atraumatic, no cyanosis or edema  Lab Results:  Recent Labs  02/08/13 0658 02/09/13 0720  WBC 10.6* 14.4*  HGB 10.0* 11.0*  HCT 27.9* 31.4*  PLT 285 292   BMET:  Recent Labs  02/08/13 0658 02/09/13 0720  NA 138 136  K 4.0 3.5  CL 99 96  CO2 23 24  GLUCOSE 127* 115*  BUN 93* 72*  CREATININE 7.69* 5.58*  CALCIUM 8.2* 8.4   No results found for this basename: PTH,  in the last 72 hours Iron Studies: No results found for this basename: IRON, TIBC, TRANSFERRIN, FERRITIN,  in the last 72 hours Studies/Results: No results found.  Scheduled: . [START ON 02/10/2013] cefUROXime (ZINACEF)  IV  1.5 g Intravenous On Call to OR  . darbepoetin (ARANESP) injection - DIALYSIS  100 mcg Intravenous Q Sat-HD  . insulin aspart  0-9 Units Subcutaneous TID WC  . insulin detemir  5 Units Subcutaneous QHS  . isoniazid  300  mg Oral Daily  . pantoprazole  40 mg Oral Q1200  . predniSONE  60 mg Oral Q breakfast  . vitamin B-6  50 mg Oral Daily  . risperiDONE  0.5 mg Oral Daily  . sodium bicarbonate  650 mg Oral BID  . sodium chloride  3 mL Intravenous Q12H  . sodium chloride  3 mL Intravenous Q12H  . sulfamethoxazole-trimethoprim  1 tablet Oral Q M,W,F-1800    LOS: 5 days   Dayvin Aber C 02/09/2013,10:27 AM

## 2013-02-09 NOTE — Progress Notes (Signed)
Physical Therapy Treatment Patient Details Name: James Nicholson MRN: [id number] DOB: [birth year] Today's Date: 02/09/2013 Time: [id number] PT Time Calculation (min): 37 min  PT Assessment / Plan / Recommendation Comments on Treatment Session  Pt is improved in mobility and gait and is assisted by use of straight cane.  He continues to have some difficutlies with coordination of movement and c/o dizziness.  He would benefit from HHPT for safety eval in environment and caregiver education about a sustainable exercise program    Follow Up Recommendations  Home health PT;Other (comment) (group home setting)     Does the patient have the potential to tolerate intense rehabilitation     Barriers to Discharge        Equipment Recommendations  Cane    Recommendations for Other Services    Frequency Min 3X/week   Plan Discharge plan needs to be updated    Precautions / Restrictions Precautions Precautions: Fall Precaution Comments: pt with staggering gait pattern Restrictions Weight Bearing Restrictions: No   Pertinent Vitals/Pain Pt c/o dizziness    Mobility  Bed Mobility Details for Bed Mobility Assistance: pt sitting up in chair upon arrival Transfers Transfers: Sit to Stand;Stand to Sit Sit to Stand: 6: Modified independent (Device/Increase time);With upper extremity assist;With armrests Stand to Sit: 6: Modified independent (Device/Increase time);With armrests Details for Transfer Assistance: pt able to repeat sit to stand x 5 repetitions for strengthening Ambulation/Gait Ambulation/Gait Assistance: 5: Supervision;4: Min assist Ambulation Distance (Feet): 500 Feet (150. 150. 200) Assistive device: Straight cane Ambulation/Gait Assistance Details: assist to encourage contralateral arm swing and correct straight cane technique in gait. Mulit modal cues to extend uppoer trunk Pt self selects carrying cane in right hand Gait Pattern: Step-through pattern;Trunk flexed (maintains  forward head, decreased reciprocal arm swing) Gait velocity: WFL General Gait Details: pt maintains kyphotic, rounded shoulders. forward head posture.  Pt continues to report he is dizzy, but also says that use of cane helps him with balance Stairs: No Wheelchair Mobility Wheelchair Mobility: No    Exercises General Exercises - Lower Extremity Ankle Circles/Pumps: AROM Hip ABduction/ADduction: AROM;Both;5 reps;Standing Hip Flexion/Marching: AROM;Both;5 reps;Standing Other Exercises Other Exercises: repeated sit to stand Other Exercises: standing stabilization with full UE ROM in diagonal patterns  Other Exercises: attempted contralateral shoulder and hip flexion in sitting, but pt was not able to coordinate movement with several tries at multimodal cueing   PT Diagnosis:    PT Problem List:   PT Treatment Interventions:     PT Goals Acute Rehab PT Goals PT Goal Formulation: With patient Time For Goal Achievement: 02/21/13 Potential to Achieve Goals: Good Pt will go Supine/Side to Sit: Independently;with HOB 0 degrees Pt will go Sit to Supine/Side: Independently;with HOB 0 degrees Pt will go Sit to Stand: with supervision PT Goal: Sit to Stand - Progress: Met Pt will go Stand to Sit: with supervision PT Goal: Stand to Sit - Progress: Met Pt will Ambulate: >150 feet;with supervision PT Goal: Ambulate - Progress: Progressing toward goal  Visit Information  Last PT Received On: 02/09/13    Subjective Data  Subjective: "I'm dizzy" Patient Stated Goal: to go back to group home   Cognition  Cognition Overall Cognitive Status: History of cognitive impairments - at baseline Arousal/Alertness: Awake/alert Behavior During Session: [facility] for tasks performed Cognition - Other Comments: pt has some difficulty with higher level commands and motor coordination with contralateral movements    Balance     End of Session PT - End of Session  Activity Tolerance: Patient limited by  fatigue Patient left: in chair;with chair alarm set;with family/visitor present (brother and MD in room) Nurse Communication: Mobility status   GP   Donato Heinz. [place], [place]  02/09/2013, 10:22 AM

## 2013-02-09 NOTE — Progress Notes (Signed)
[facility] PROGRESS NOTE  James Hai [id number] DOB: Jun 27, 1966 DOA: 02/04/2013 PCP: [name], MD  Assessment/Plan: 1. Wegener granulomatosis with epistaxis, alveolar hemorrhage and renal failure- fulminant - started  HD 02/05/13. Received  steroids iv pulse dose 1 g for 3 days and cyclophosphamide 500 mg iv once  after HD 4/12 with improvement in epistaxis and hemoptysis. Now on maintenance dose prednisone 60 mg daily.  2. 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 Quantiferon TB gold pending - sent out on 4/12 .  3. Received  empiric anitbiotic for 48 hrs until it was clear that he does not have an infectious process.  4. Acute blood loss anemia due to alveolar hemorrhage -  transfused 1 unit of prbcs 02/05/13  5. Mild diarrhea 4/13 - probable from CTX - follow closely .   c diff - pending - started  imodium    Code Status: full code  Family Communication: brother  Disposition Plan: back to group home once outpt HD established    Consultants:  Nephrology   Pulmonary   Procedures:  Hd catheter 02/05/13   Antibiotics: Vanc and Zosyn 4/11-4/12  HPI/Subjective: Ambulated the entire hallway     Objective: Filed Vitals:   02/08/13 1846 02/08/13 2159 02/09/13 0622 02/09/13 0750  BP: 121/69 121/71 112/59 122/73  Pulse: 86 77 64 76  Temp: 98.7 F (37.1 C) 98.2 F (36.8 C) 97.9 F (36.6 C) 98 F (36.7 C)  TempSrc: Oral Oral Oral Oral  Resp: 16 16 16 16   Height:      Weight:  70.7 kg (155 lb 13.8 oz)    SpO2: 98% 95% 95% 95%    Intake/Output Summary (Last 24 hours) at 02/09/13 1004 Last data filed at 02/09/13 0810  Gross per 24 hour  Intake    480 ml  Output   1821 ml  Net  -1341 ml   Filed Weights   02/08/13 0625 02/08/13 1016 02/08/13 2159  Weight: 72 kg (158 lb 11.7 oz) 70.7 kg (155 lb 13.8 oz) 70.7 kg (155 lb 13.8 oz)    Exam:   General:  Alert   Cardiovascular: regular, no  M<R,G   Respiratory: no further  crackles  Abdomen: soft, nt   Musculoskeletal: no edema     Data Reviewed: Basic Metabolic Panel:  Recent Labs Lab 02/05/13 0710 02/05/13 1900 02/06/13 0500 02/07/13 0455 02/08/13 0658 02/09/13 0720  NA 134*  --  135 137 138 136  K 4.9  --  4.6 4.1 4.0 3.5  CL 104  --  99 100 99 96  CO2 15*  --  19 24 23 24   GLUCOSE 73  --  162* 132* 127* 115*  BUN 97*  --  72* 56* 93* 72*  CREATININE 10.71*  --  8.41* 6.07* 7.69* 5.58*  CALCIUM 8.8 8.3* 8.8 8.5 8.2* 8.4  PHOS  --   --  6.3* 6.0* 7.5*  --    Liver Function Tests:  Recent Labs Lab 02/04/13 2110 02/05/13 0710 02/06/13 0500 02/07/13 0455 02/08/13 0658  AST 16 15  --   --   --   ALT <5 <5  --   --   --   ALKPHOS 52 48  --   --   --   BILITOT 0.5 0.8  --   --   --   PROT 7.1 6.6  --   --   --   ALBUMIN 2.8*  2.5* 2.6* 2.5* 2.6*   No results found for this basename: LIPASE, AMYLASE,  in the last 168 hours No results found for this basename: AMMONIA,  in the last 168 hours CBC:  Recent Labs Lab 02/04/13 2110 02/05/13 0710 02/06/13 0500 02/07/13 0455 02/08/13 0658 02/09/13 0720  WBC 9.4 7.9 8.4 10.7* 10.6* 14.4*  NEUTROABS 7.0 6.4  --   --   --   --   HGB 7.0* 6.9* 10.1* 9.8* 10.0* 11.0*  HCT 20.2* 19.5* 27.5* 27.0* 27.9* 31.4*  MCV 82.1 80.2 77.2* 78.3 78.8 79.3  PLT 296 256 261 267 285 292   Cardiac Enzymes: No results found for this basename: CKTOTAL, CKMB, CKMBINDEX, TROPONINI,  in the last 168 hours BNP (last 3 results) No results found for this basename: PROBNP,  in the last 8760 hours CBG:  Recent Labs Lab 02/07/13 2110 02/08/13 1059 02/08/13 1629 02/08/13 2157 02/09/13 0804  GLUCAP 173* 126* 149* 122* 111*    Recent Results (from the past 240 hour(s))  CULTURE, BLOOD (ROUTINE X 2)     Status: None   Collection Time    02/04/13 11:46 PM      Result Value Range Status   Specimen Description BLOOD RIGHT HAND   Final   Special Requests BOTTLES DRAWN  AEROBIC ONLY 10CC   Final   Culture  Setup Time 02/05/2013 06:13   Final   Culture     Final   Value:        BLOOD CULTURE RECEIVED NO GROWTH TO DATE CULTURE WILL BE HELD FOR 5 DAYS BEFORE ISSUING A FINAL NEGATIVE REPORT   Report Status PENDING   Incomplete  CULTURE, BLOOD (ROUTINE X 2)     Status: None   Collection Time    02/04/13 11:56 PM      Result Value Range Status   Specimen Description BLOOD LEFT ARM   Final   Special Requests BOTTLES DRAWN AEROBIC ONLY 10CC   Final   Culture  Setup Time 02/05/2013 06:13   Final   Culture     Final   Value:        BLOOD CULTURE RECEIVED NO GROWTH TO DATE CULTURE WILL BE HELD FOR 5 DAYS BEFORE ISSUING A FINAL NEGATIVE REPORT   Report Status PENDING   Incomplete  URINE CULTURE     Status: None   Collection Time    02/05/13  1:27 AM      Result Value Range Status   Specimen Description URINE, CLEAN CATCH   Final   Special Requests Normal   Final   Culture  Setup Time 02/05/2013 01:54   Final   Colony Count NO GROWTH   Final   Culture NO GROWTH   Final   Report Status 02/06/2013 FINAL   Final  MRSA PCR SCREENING     Status: None   Collection Time    02/05/13  2:32 AM      Result Value Range Status   MRSA by PCR NEGATIVE  NEGATIVE Final   Comment:            The GeneXpert MRSA Assay (FDA     approved for NASAL specimens     only), is one component of a     comprehensive MRSA colonization     surveillance program. It is not     intended to diagnose MRSA     infection nor to guide or     monitor treatment for  MRSA infections.     Studies: No results found.  Scheduled Meds: . [START ON 02/10/2013] cefUROXime (ZINACEF)  IV  1.5 g Intravenous On Call to OR  . darbepoetin (ARANESP) injection - DIALYSIS  100 mcg Intravenous Q Sat-HD  . insulin aspart  0-9 Units Subcutaneous TID WC  . insulin detemir  5 Units Subcutaneous QHS  . isoniazid  300 mg Oral Daily  . pantoprazole  40 mg Oral Q1200  . predniSONE  60 mg Oral Q breakfast  .  vitamin B-6  50 mg Oral Daily  . risperiDONE  0.5 mg Oral Daily  . sodium bicarbonate  650 mg Oral BID  . sodium chloride  3 mL Intravenous Q12H  . sodium chloride  3 mL Intravenous Q12H  . sulfamethoxazole-trimethoprim  1 tablet Oral Q M,W,F-1800   Continuous Infusions: . sodium chloride Stopped (02/06/13 0039)  . sodium chloride Stopped (02/06/13 0039)    Principal Problem:   Wegener's disease, pulmonary Active Problems:   Other primary cardiomyopathies   PPD positive   ARF (acute renal failure)   Anemia   Pneumonia   GI bleed   Metabolic acidosis      James Nicholson  [facility] Pager [phone]. If 7PM-7AM, please contact night-coverage at [email or web address], password [facility] 02/09/2013, 10:04 AM  LOS: 5 days

## 2013-02-09 NOTE — Evaluation (Signed)
Occupational Therapy Evaluation and Discharge Patient Details Name: James Nicholson MRN: [id number] DOB: 1966-03-23 Today's Date: 02/09/2013 Time: [id number] OT Time Calculation (min): 26 min  OT Assessment / Plan / Recommendation Clinical Impression  This 47 yo male admitted with cough and increasing lower extremity swelling presents to acute OT at a set/S level and will have this at group home. Acute OT will sign off.    OT Assessment  Patient does not need any further OT services    Follow Up Recommendations  No OT follow up       Equipment Recommendations  None recommended by OT          Precautions / Restrictions Precautions Precautions: Fall Precaution Comments: pt with staggering gait pattern Restrictions Weight Bearing Restrictions: No    Pt did c/o dizziness initially, but by end of session no c/o of this   ADL  Eating/Feeding: Simulated;Independent Where Assessed - Eating/Feeding: Edge of bed Grooming: Performed;Wash/dry hands;Wash/dry face;Supervision/safety Where Assessed - Grooming: Unsupported standing Upper Body Bathing: Simulated;Supervision/safety Where Assessed - Upper Body Bathing: Unsupported sitting Lower Body Bathing: Simulated;Supervision/safety Where Assessed - Lower Body Bathing: Unsupported sit to stand Upper Body Dressing: Simulated;Supervision/safety Where Assessed - Upper Body Dressing: Unsupported sitting Lower Body Dressing: Performed;Supervision/safety Where Assessed - Lower Body Dressing: Unsupported sit to stand Toilet Transfer: Performed;Supervision/safety Toilet Transfer Method: Sit to Loss adjuster, chartered: Regular height toilet Toileting - Clothing Manipulation and Hygiene: Performed;Independent Where Assessed - Toileting Clothing Manipulation and Hygiene: Standing Equipment Used: Gait belt Transfers/Ambulation Related to ADLs: Per brother, pt is moving at a slower pace than his baseline---in room he was safe with  ambulation and no staggering gait seen.        Visit Information  Last OT Received On: 02/09/13 Assistance Needed: +1    Subjective Data  Subjective: limited English   Prior Functioning     Home Living Lives With: Other (Comment) Available Help at Discharge: Available 24 hours/day;Other (Comment);Family Type of Home: House Bathroom Toilet: Standard Home Adaptive Equipment: None Prior Function Level of Independence: Needs assistance Needs Assistance: Meal Prep;Light Housekeeping Meal Prep: Other (comment) (group home provided meals) Light Housekeeping:  (staff at group home do this) Driving: No Vocation: On disability Communication Communication: Prefers language other than [place];Other (comment) ([place]) Dominant Hand: Right         Vision/Perception Vision - History Baseline Vision: Wears glasses only for reading Patient Visual Report: No change from baseline   Cognition  Cognition Overall Cognitive Status: History of cognitive impairments - at baseline Arousal/Alertness: Awake/alert Orientation Level: Appears intact for tasks assessed Behavior During Session: [facility] for tasks performed Cognition - Other Comments: pt has some difficulty with higher level commands and motor coordination with contralateral movements    Extremity/Trunk Assessment Right Upper Extremity Assessment RUE ROM/Strength/Tone: [facility] for tasks assessed Left Upper Extremity Assessment LUE ROM/Strength/Tone: [facility] for tasks assessed     Mobility Bed Mobility Bed Mobility: Supine to Sit;Sitting - Scoot to Edge of Bed Supine to Sit: 6: Modified independent (Device/Increase time) Sitting - Scoot to Edge of Bed: 6: Modified independent (Device/Increase time) Details for Bed Mobility Assistance: pt sitting up in chair upon arrival Transfers Transfers: Sit to Stand;Stand to Sit Sit to Stand: 6: Modified independent (Device/Increase time);From bed Stand to Sit: 6: Modified independent  (Device/Increase time);With upper extremity assist;With armrests;To chair/3-in-1        Balance Balance Balance Assessed: Yes Dynamic Sitting Balance Dynamic Sitting - Balance Support: No upper extremity supported;Feet supported Dynamic Sitting -  Level of Assistance: 6: Modified independent (Device/Increase time) Dynamic Sitting - Comments: To doff and don socks Dynamic Standing Balance Dynamic Standing - Balance Support: No upper extremity supported Dynamic Standing - Level of Assistance:  (Supervision) Dynamic Standing - Comments: washing face and hands at the sink   End of Session OT - End of Session Equipment Utilized During Treatment: Gait belt Activity Tolerance: Patient tolerated treatment well Patient left: in chair;with call bell/phone within reach;with family/visitor present;with chair alarm set       Almon Register [id number] 02/09/2013, 10:58 AM

## 2013-02-09 NOTE — Progress Notes (Signed)
Per brother in room, states pt is confused (disoriented to situation), states that pt not following brothers commands to stay in bed and use call bell. Pt is on the bed alarm, fall precautions in place. Pt easily re-oriented. Pts brother states that this is not unusual for him. Will continue to monitor.

## 2013-02-10 ENCOUNTER — Encounter (HOSPITAL_COMMUNITY): Payer: Self-pay | Admitting: Anesthesiology

## 2013-02-10 ENCOUNTER — Inpatient Hospital Stay (HOSPITAL_COMMUNITY): Payer: Medicaid Other

## 2013-02-10 ENCOUNTER — Encounter (HOSPITAL_COMMUNITY)
Admission: EM | Disposition: A | Discharge status: Other Acute Inpatient Hospital | Payer: Self-pay | Source: Home / Self Care | Attending: Internal Medicine

## 2013-02-10 ENCOUNTER — Telehealth: Payer: Self-pay | Admitting: Vascular Surgery

## 2013-02-10 ENCOUNTER — Inpatient Hospital Stay (HOSPITAL_COMMUNITY): Payer: Medicaid Other | Admitting: Anesthesiology

## 2013-02-10 HISTORY — PX: AV FISTULA PLACEMENT: SHX1204

## 2013-02-10 LAB — GLUCOSE, CAPILLARY
Glucose-Capillary: 134 mg/dL — ABNORMAL HIGH (ref 70–99)
Glucose-Capillary: 85 mg/dL (ref 70–99)
Glucose-Capillary: 89 mg/dL (ref 70–99)
Glucose-Capillary: 165 mg/dL — ABNORMAL HIGH (ref 70–99)
Glucose-Capillary: 182 mg/dL — ABNORMAL HIGH (ref 70–99)

## 2013-02-10 LAB — BASIC METABOLIC PANEL
BUN: 106 mg/dL — ABNORMAL HIGH (ref 6–23)
CO2: 21 mEq/L (ref 19–32)
Calcium: 8.1 mg/dL — ABNORMAL LOW (ref 8.4–10.5)
Chloride: 95 mEq/L — ABNORMAL LOW (ref 96–112)
Creatinine, Ser: 7.27 mg/dL — ABNORMAL HIGH (ref 0.50–1.35)
GFR calc Af Amer: 9 mL/min — ABNORMAL LOW (ref >90)
GFR calc non Af Amer: 8 mL/min — ABNORMAL LOW (ref >90)
Glucose, Bld: 192 mg/dL — ABNORMAL HIGH (ref 70–99)
Potassium: 3.9 mEq/L (ref 3.5–5.1)
Sodium: 135 mEq/L (ref 135–145)

## 2013-02-10 LAB — MPO/PR-3 (ANCA) ANTIBODIES
Myeloperoxidase Abs: 3 AU/mL (ref <20)
Serine Protease 3: 927 AU/mL — ABNORMAL HIGH (ref <20)

## 2013-02-10 LAB — TROPONIN I
Troponin I: <0.3 ng/mL (ref <0.30)
Troponin I: <0.3 ng/mL (ref <0.30)

## 2013-02-10 LAB — ANCA TITERS: C-ANCA: 1:320 {titer} — ABNORMAL HIGH

## 2013-02-10 LAB — MAGNESIUM: Magnesium: 2.2 mg/dL (ref 1.5–2.5)

## 2013-02-10 SURGERY — ARTERIOVENOUS (AV) FISTULA CREATION
Anesthesia: General | Site: Arm Lower | Laterality: Right | Wound class: Clean

## 2013-02-10 MED ORDER — LIDOCAINE HCL (CARDIAC) 20 MG/ML IV SOLN
INTRAVENOUS | Status: DC | PRN
  Administered 2013-02-10: 70 mg via INTRAVENOUS

## 2013-02-10 MED ORDER — HEPARIN SODIUM (PORCINE) 1000 UNIT/ML DIALYSIS: 1000.0000 [IU] | INTRAMUSCULAR | Status: DC | PRN

## 2013-02-10 MED ORDER — MEPERIDINE HCL 25 MG/ML IJ SOLN: 6.2500 mg | INTRAMUSCULAR | Status: DC | PRN

## 2013-02-10 MED ORDER — OXYCODONE HCL 5 MG PO TABS
5.0000 mg | ORAL_TABLET | ORAL | Status: DC | PRN
  Filled 2013-02-10: qty 1

## 2013-02-10 MED ORDER — HEPARIN SODIUM (PORCINE) 1000 UNIT/ML DIALYSIS
20.0000 [IU]/kg | INTRAMUSCULAR | Status: DC | PRN
  Administered 2013-02-11: 1400 [IU] via INTRAVENOUS_CENTRAL
  Filled 2013-02-10: qty 2

## 2013-02-10 MED ORDER — LIDOCAINE HCL (PF) 1 % IJ SOLN: 5.0000 mL | INTRAMUSCULAR | Status: DC | PRN

## 2013-02-10 MED ORDER — ALTEPLASE 2 MG IJ SOLR: 2.0000 mg | Freq: Once | INTRAMUSCULAR | Status: DC | PRN

## 2013-02-10 MED ORDER — NEPRO/CARBSTEADY PO LIQD: 237.0000 mL | ORAL | Status: DC | PRN

## 2013-02-10 MED ORDER — ALTEPLASE 2 MG IJ SOLR: 2.0000 mg | Freq: Once | INTRAMUSCULAR | Status: AC | PRN

## 2013-02-10 MED ORDER — SODIUM CHLORIDE 0.9 % IV SOLN: 100.0000 mL | INTRAVENOUS | Status: DC | PRN

## 2013-02-10 MED ORDER — LIDOCAINE-EPINEPHRINE 0.5 %-1:200000 IJ SOLN
INTRAMUSCULAR | Status: AC
  Filled 2013-02-10: qty 1

## 2013-02-10 MED ORDER — FENTANYL CITRATE 0.05 MG/ML IJ SOLN
INTRAMUSCULAR | Status: DC | PRN
  Administered 2013-02-10: 50 ug via INTRAVENOUS
  Administered 2013-02-10: 100 ug via INTRAVENOUS

## 2013-02-10 MED ORDER — HEPARIN SODIUM (PORCINE) 1000 UNIT/ML DIALYSIS: 20.0000 [IU]/kg | INTRAMUSCULAR | Status: DC | PRN

## 2013-02-10 MED ORDER — PROPOFOL 10 MG/ML IV BOLUS
INTRAVENOUS | Status: DC | PRN
  Administered 2013-02-10: 150 mg via INTRAVENOUS

## 2013-02-10 MED ORDER — LIDOCAINE-PRILOCAINE 2.5-2.5 % EX CREA: 1.0000 "application " | TOPICAL_CREAM | CUTANEOUS | Status: DC | PRN

## 2013-02-10 MED ORDER — SODIUM CHLORIDE 0.9 % IR SOLN
Status: DC | PRN
  Administered 2013-02-10: 10:00:00

## 2013-02-10 MED ORDER — OXYCODONE HCL 5 MG/5ML PO SOLN: 5.0000 mg | Freq: Once | ORAL | Status: DC | PRN

## 2013-02-10 MED ORDER — MIDAZOLAM HCL 5 MG/5ML IJ SOLN
INTRAMUSCULAR | Status: DC | PRN
  Administered 2013-02-10: 1 mg via INTRAVENOUS

## 2013-02-10 MED ORDER — HEPARIN SODIUM (PORCINE) 1000 UNIT/ML DIALYSIS
1000.0000 [IU] | INTRAMUSCULAR | Status: DC | PRN
  Filled 2013-02-10: qty 1

## 2013-02-10 MED ORDER — MORPHINE SULFATE 2 MG/ML IJ SOLN: 2.0000 mg | INTRAMUSCULAR | Status: DC | PRN

## 2013-02-10 MED ORDER — PROMETHAZINE HCL 25 MG/ML IJ SOLN: 6.2500 mg | INTRAMUSCULAR | Status: DC | PRN

## 2013-02-10 MED ORDER — PENTAFLUOROPROP-TETRAFLUOROETH EX AERO: 1.0000 "application " | INHALATION_SPRAY | CUTANEOUS | Status: DC | PRN

## 2013-02-10 MED ORDER — 0.9 % SODIUM CHLORIDE (POUR BTL) OPTIME
TOPICAL | Status: DC | PRN
  Administered 2013-02-10: 1000 mL

## 2013-02-10 MED ORDER — HYDROMORPHONE HCL PF 1 MG/ML IJ SOLN
0.2500 mg | INTRAMUSCULAR | Status: DC | PRN
  Administered 2013-02-10: 0.5 mg via INTRAVENOUS
  Filled 2013-02-10: qty 1

## 2013-02-10 MED ORDER — ARTIFICIAL TEARS OP OINT
TOPICAL_OINTMENT | OPHTHALMIC | Status: DC | PRN
  Administered 2013-02-10: 1 via OPHTHALMIC

## 2013-02-10 MED ORDER — OXYCODONE HCL 5 MG PO TABS: 5.0000 mg | ORAL_TABLET | Freq: Once | ORAL | Status: DC | PRN

## 2013-02-10 SURGICAL SUPPLY — 37 items
BENZOIN TINCTURE PRP APPL 2/3 (GAUZE/BANDAGES/DRESSINGS) ×2 IMPLANT
CANISTER SUCTION 2500CC (MISCELLANEOUS) ×2 IMPLANT
CLIP LIGATING EXTRA MED SLVR (CLIP) ×2 IMPLANT
CLIP LIGATING EXTRA SM BLUE (MISCELLANEOUS) ×2 IMPLANT
CLOTH BEACON ORANGE TIMEOUT ST (SAFETY) ×2 IMPLANT
COVER PROBE W GEL 5X96 (DRAPES) ×2 IMPLANT
COVER SURGICAL LIGHT HANDLE (MISCELLANEOUS) ×2 IMPLANT
DECANTER SPIKE VIAL GLASS SM (MISCELLANEOUS) ×2 IMPLANT
ELECT REM PT RETURN 9FT ADLT (ELECTROSURGICAL) ×2
ELECTRODE REM PT RTRN 9FT ADLT (ELECTROSURGICAL) ×1 IMPLANT
GEL ULTRASOUND 20GR AQUASONIC (MISCELLANEOUS) IMPLANT
GLOVE BIO SURGEON STRL SZ7 (GLOVE) ×2 IMPLANT
GLOVE BIO SURGEON STRL SZ8 (GLOVE) ×2 IMPLANT
GLOVE BIOGEL PI IND STRL 6.5 (GLOVE) ×1 IMPLANT
GLOVE BIOGEL PI IND STRL 7.0 (GLOVE) ×1 IMPLANT
GLOVE BIOGEL PI INDICATOR 6.5 (GLOVE) ×1
GLOVE BIOGEL PI INDICATOR 7.0 (GLOVE) ×1
GLOVE SS BIOGEL STRL SZ 7.5 (GLOVE) ×1 IMPLANT
GLOVE SUPERSENSE BIOGEL SZ 7.5 (GLOVE) ×1
GLOVE SURG SS PI 7.0 STRL IVOR (GLOVE) ×2 IMPLANT
GOWN STRL NON-REIN LRG LVL3 (GOWN DISPOSABLE) ×4 IMPLANT
GOWN STRL REIN XL XLG (GOWN DISPOSABLE) ×2 IMPLANT
KIT BASIN OR (CUSTOM PROCEDURE TRAY) ×2 IMPLANT
KIT ROOM TURNOVER OR (KITS) ×2 IMPLANT
NS IRRIG 1000ML POUR BTL (IV SOLUTION) ×2 IMPLANT
PACK CV ACCESS (CUSTOM PROCEDURE TRAY) ×2 IMPLANT
PAD ARMBOARD 7.5X6 YLW CONV (MISCELLANEOUS) ×4 IMPLANT
SPONGE GAUZE 4X4 12PLY (GAUZE/BANDAGES/DRESSINGS) ×2 IMPLANT
STRIP CLOSURE SKIN 1/2X4 (GAUZE/BANDAGES/DRESSINGS) ×2 IMPLANT
SUT PROLENE 6 0 CC (SUTURE) ×2 IMPLANT
SUT VIC AB 3-0 SH 27 (SUTURE) ×1
SUT VIC AB 3-0 SH 27X BRD (SUTURE) ×1 IMPLANT
TAPE CLOTH SURG 4X10 WHT LF (GAUZE/BANDAGES/DRESSINGS) ×2 IMPLANT
TOWEL OR 17X24 6PK STRL BLUE (TOWEL DISPOSABLE) ×2 IMPLANT
TOWEL OR 17X26 10 PK STRL BLUE (TOWEL DISPOSABLE) ×2 IMPLANT
UNDERPAD 30X30 INCONTINENT (UNDERPADS AND DIAPERS) ×2 IMPLANT
WATER STERILE IRR 1000ML POUR (IV SOLUTION) ×2 IMPLANT

## 2013-02-10 NOTE — Op Note (Signed)
OPERATIVE REPORT  DATE OF SURGERY: 02/10/2013  PATIENT: James Nicholson, 47 y.o. male MRN: [id number]  DOB: 11-01-1965  PRE-OPERATIVE DIAGNOSIS: End-stage renal disease  POST-OPERATIVE DIAGNOSIS:  Same  PROCEDURE: Right Cimino radiocephalic AV fistula  SURGEON:  Curt Jews, M.D.  PHYSICIAN ASSISTANT: Roczniak  ANESTHESIA:  Gen.  EBL: Minimal ml  Total I/O In: 640 [P.O.:240; I.V.:400] Out: -   BLOOD ADMINISTERED: None  DRAINS: None  SPECIMEN: None  COUNTS CORRECT:  YES  PLAN OF CARE: PACU   PATIENT DISPOSITION:  PACU - hemodynamically stable  PROCEDURE DETAILS: The patient was taken up in place supervision an area of the right arm right retrogastric new sterile fashion. Incision was made between the level of the cephalic vein and the radial artery at the wrist. The cephalic vein was of good caliber. The vein was mobilized proximally and distally and was ligated distally and divided. The vein was brought into approximation of the radial artery. The radial artery was occluded with Serafin clamps proximally and distally was opened with an 11 blade and extended longitudinally with Potts scissors. The vein was cut to the appropriate length and spatulated. The vein was sewn end-to-side to the artery with a running 6-0 Prolene suture. Clamps removed and good thrill was noted. The wounds were irrigated with saline. Hemostasis was obtained with electrocautery. The wounds were closed with 3-0 Vicryl in the subcutaneous and subcuticular tissue. Sterile dressing was applied the patient was taken to the recovery room in stable condition   Curt Jews, M.D. 02/10/2013 2:35 PM

## 2013-02-10 NOTE — Interval H&P Note (Signed)
History and Physical Interval Note:  02/10/2013 7:12 AM  James Nicholson  has presented today for surgery, with the diagnosis of End Stage Renal Disease  The various methods of treatment have been discussed with the patient and family. After consideration of risks, benefits and other options for treatment, the patient has consented to  Procedure(s): ARTERIOVENOUS (AV) FISTULA CREATION (Right) as a surgical intervention .  The patient's history has been reviewed, patient examined, no change in status, stable for surgery.  I have reviewed the patient's chart and labs.  Questions were answered to the patient's satisfaction.     [name]

## 2013-02-10 NOTE — Progress Notes (Signed)
02/10/2013 11:38 AM Hemodialysis Outpatient Note: this patient has been accepted at the [facility] on a Tues-Thurs-Sat 2nd shift schedule, pt can begin tomorrow at 11:00  if discharged, if not then he would need to be at the center on Friday at an arranged time to sign consent/paperwork and then begin treatment on Saturday. We would need to coordinate this with the center. Patient only speaks [place] and would need an interpreter to go to the center with him when signing paperwork. Thank you. Gordy Savers

## 2013-02-10 NOTE — Anesthesia Preprocedure Evaluation (Addendum)
Anesthesia Evaluation  Patient identified by MRN, date of birth, ID band Patient awake    History of Anesthesia Complications Negative for: history of anesthetic complications  Airway Mallampati: I  Neck ROM: Full    Dental   Pulmonary  breath sounds clear to auscultation+ rhonchi         Cardiovascular hypertension, + dysrhythmias + Cardiac Defibrillator Rate:Normal     Neuro/Psych PSYCHIATRIC DISORDERS    GI/Hepatic   Endo/Other    Renal/GU Renal disease     Musculoskeletal   Abdominal   Peds  Hematology   Anesthesia Other Findings   Reproductive/Obstetrics                          Anesthesia Physical Anesthesia Plan  ASA: IV  Anesthesia Plan: General   Post-op Pain Management:    Induction: Intravenous  Airway Management Planned: LMA  Additional Equipment:   Intra-op Plan:   Post-operative Plan: Extubation in OR  Informed Consent: I have reviewed the patients History and Physical, chart, labs and discussed the procedure including the risks, benefits and alternatives for the proposed anesthesia with the patient or authorized representative who has indicated his/her understanding and acceptance.   Dental advisory given  Plan Discussed with: Surgeon and CRNA  Anesthesia Plan Comments: (Non English speaking. Plan GA)       Anesthesia Quick Evaluation

## 2013-02-10 NOTE — Progress Notes (Signed)
Patient noted to have unsustained 18 beat run of SVT on telemetry (exact time mark =12:57). Upon assessment, pt appears asymptomatic. VS's are WNL. With use of translator and Dr. Tyrell Antonio at bedside, pt denies SOB or chest pain.  Per M.D. Orders, an EKG was performed and lab orders submitted for serial (x3 occurences) Troponin, BMET, and Magnesium.  Will continue to monitor patient closely.

## 2013-02-10 NOTE — Interval H&P Note (Signed)
History and Physical Interval Note:  02/10/2013 7:13 AM  James Nicholson  has presented today for surgery, with the diagnosis of End Stage Renal Disease  The various methods of treatment have been discussed with the patient and family. After consideration of risks, benefits and other options for treatment, the patient has consented to  Procedure(s): ARTERIOVENOUS (AV) FISTULA CREATION (Right) as a surgical intervention .  The patient's history has been reviewed, patient examined, no change in status, stable for surgery.  I have reviewed the patient's chart and labs.  Questions were answered to the patient's satisfaction.     EARLY, TODD

## 2013-02-10 NOTE — Progress Notes (Addendum)
Impression/Recommendations  47 yo [place] male  Renal failure with h/o P-ANCA + necrotizing GN (Wegener's) and treatment with cytoxan/steroids 2010; now with recurrence with pulmonary involvement and prob ESRD  Plan Access work up in progress. Next HD Thurs. On Steroids and Cytoxan.  OP hemodialysis arranged as below:          "Hemodialysis Outpatient Note: this patient has been accepted at the [facility] on a Tues-Thurs-Sat 2nd shift schedule, pt can begin tomorrow at 11:00 if discharged today(unlikely), if not then he would need to be at the center on Friday at an arranged time to sign consent/paperwork and then begin treatment on Saturday. We would need to coordinate this with the center. Patient only speaks [place] and would need an interpreter to go to the center with him when signing paperwork."   Subjective: Interval History: S/P AVF RUE  Objective: Vital signs in last 24 hours: Temp:  [97.6 F (36.4 C)-98.6 F (37 C)] 97.7 F (36.5 C) (04/16 1210) Pulse Rate:  [60-78] 66 (04/16 1259) Resp:  [11-20] 20 (04/16 1210) BP: (102-127)/(61-75) 110/63 mmHg (04/16 1259) SpO2:  [96 %-100 %] 97 % (04/16 1210) FiO2 (%):  [100 %] 100 % (04/16 0930) Weight:  [71 kg (156 lb 8.4 oz)] 71 kg (156 lb 8.4 oz) (04/15 2157) Weight change: 0.3 kg (10.6 oz)  Intake/Output from previous day: 04/15 0701 - 04/16 0700 In: 1080 [P.O.:1080] Out: 923 [Urine:920; Stool:3] Intake/Output this shift: Total I/O In: 640 [P.O.:240; I.V.:400] Out: -   General appearance: alert, cooperative and appears stated age GI: soft, non-tender; bowel sounds normal; no masses,  no organomegaly Extremities: extremities normal, atraumatic, no cyanosis or edema and AVF RUE  Lab Results:  Recent Labs  02/08/13 0658 02/09/13 0720  WBC 10.6* 14.4*  HGB 10.0* 11.0*  HCT 27.9* 31.4*  PLT 285 292   BMET:  Recent Labs  02/08/13 0658 02/09/13 0720  NA 138 136  K 4.0 3.5  CL 99 96  CO2 23 24  GLUCOSE  127* 115*  BUN 93* 72*  CREATININE 7.69* 5.58*  CALCIUM 8.2* 8.4   No results found for this basename: PTH,  in the last 72 hours Iron Studies: No results found for this basename: IRON, TIBC, TRANSFERRIN, FERRITIN,  in the last 72 hours Studies/Results: Dg Chest 2 View  02/10/2013  *RADIOLOGY REPORT*  Clinical Data: Hypertension, cough, Wegener's.  CHEST - 2 VIEW  Comparison: 02/07/2013 and CT chest 02/05/2013.  Findings: Trachea is midline.  Right IJ dialysis catheter tips project over the SVC.  Left-sided AICD lead tip projects over the right ventricle.  Heart size stable.  Lungs are somewhat low in volume with biapical pleural parenchymal scarring.  Interval improvement in mild diffuse interstitial prominence and indistinctness, as seen on 02/07/2013.  No pleural fluid.  IMPRESSION: Interval resolution of pulmonary hemorrhage.   Original Report Authenticated By: Lorin Picket, M.D.    Scheduled: . calcium acetate  667 mg Oral TID WC  . darbepoetin (ARANESP) injection - DIALYSIS  100 mcg Intravenous Q Sat-HD  . insulin aspart  0-9 Units Subcutaneous TID WC  . insulin detemir  5 Units Subcutaneous QHS  . isoniazid  300 mg Oral Daily  . multivitamin  1 tablet Oral Daily  . pantoprazole  40 mg Oral Q1200  . predniSONE  60 mg Oral Q breakfast  . vitamin B-6  50 mg Oral Daily  . risperiDONE  0.5 mg Oral Daily  . sodium chloride  3 mL  Intravenous Q12H  . sodium chloride  3 mL Intravenous Q12H  . sulfamethoxazole-trimethoprim  1 tablet Oral Q M,W,F-1800    LOS: 6 days   Kadie Balestrieri C 02/10/2013,2:27 PM

## 2013-02-10 NOTE — Anesthesia Procedure Notes (Signed)
Procedure Name: LMA Insertion Date/Time: 02/10/2013 9:55 AM Performed by: Sherilyn Banker Pre-anesthesia Checklist: Patient identified, Emergency Drugs available, Suction available, Patient being monitored and Timeout performed Patient Re-evaluated:Patient Re-evaluated prior to inductionOxygen Delivery Method: Circle system utilized Preoxygenation: Pre-oxygenation with 100% oxygen Intubation Type: IV induction Ventilation: Mask ventilation without difficulty LMA: LMA inserted LMA Size: 5.0 Number of attempts: 1 Placement Confirmation: positive ETCO2 Tube secured with: Tape Dental Injury: Teeth and Oropharynx as per pre-operative assessment

## 2013-02-10 NOTE — Progress Notes (Signed)
Pt with ? 20 beat run of vtach vs SVT, non sustained. Pt with no complaints/alert & oriented other then pain in the RLE. MD and Rapid RN notified, orders given, MD now at bedside. Report given to oncoming shift.

## 2013-02-10 NOTE — Anesthesia Postprocedure Evaluation (Signed)
  Anesthesia Post-op Note  Patient: James Nicholson  Procedure(s) Performed: Procedure(s) with comments: ARTERIOVENOUS (AV) FISTULA CREATION (Right) - Right forearm radial/cephalic arterovenous fistula.   Patient Location: PACU  Anesthesia Type:General  Level of Consciousness: awake  Airway and Oxygen Therapy: Patient Spontanous Breathing  Post-op Pain: mild  Post-op Assessment: Post-op Vital signs reviewed and Patient's Cardiovascular Status Stable  Post-op Vital Signs: stable  Complications: No apparent anesthesia complications

## 2013-02-10 NOTE — Preoperative (Signed)
Beta Blockers   Reason not to administer Beta Blockers:Not Applicable 

## 2013-02-10 NOTE — Telephone Encounter (Addendum)
Message copied by Doristine Section on Wed Feb 10, 2013 11:27 AM ------      Message from: Alfonso Patten      Created: Wed Feb 10, 2013 11:14 AM                   ----- Message -----         From: Richrd Prime, PA-C         Sent: 02/10/2013  10:47 AM           To: Alfonso Patten, RN, [facility] Pool            4 week f/u AVF - early ------  notified patient of fu appt. with dr. early on 03-09-13 11:45 by mail unable to reach pt. by phone

## 2013-02-10 NOTE — Progress Notes (Signed)
[facility] PROGRESS NOTE  James Hai MD:[id number] DOB: May 20, 1966 DOA: 02/04/2013 PCP: [name], MD  Assessment/Plan: 1. Wegener granulomatosis with epistaxis, alveolar hemorrhage and renal failure- fulminant - started HD 02/05/13. Received steroids iv pulse dose 1 g for 3 days and cyclophosphamide 500 mg iv once after HD 4/12 with improvement in epistaxis and hemoptysis. Now on maintenance dose prednisone 60 mg daily. Further cytoxan dose per renal. P anca negative. C anca positive.  2. Latent TB: Positive PPD: continue isoniazide and B 6. Quantiferon TB gold indeterminate.  Discussed with pulmonary will treat for 6 month. I discussed case with Dr Tommy Medal who agree with treatment for latent TB.   3. Acute blood loss anemia due to alveolar hemorrhage - transfused 1 unit of prbcs 02/05/13 . Hb stable.  4. Mild diarrhea 4/13 - probable from CTX - follow closely . c diff - negative.  - started imodium  5.   Code Status: Full Family Communication: Care discussed with patient.  Disposition Plan: Back to group home   Consultants:  Pulmonary  Renal.   Procedures: Right Cimino radiocephalic AV fistula [id number]   Antibiotics:  Cefuroxime : one dose.   Bactrim for PCP prophylaxis. Vanc and Zosyn 4/11-4/12  HPI/Subjective: I used Electronics engineer. Patient feeling of choking and SOB better since admission. No chest pain.  Was notice to have SVT on telemetry.   Objective: Filed Vitals:   02/10/13 1146 02/10/13 1200 02/10/13 1210 02/10/13 1259  BP:   106/65 110/63  Pulse:  60 71 66  Temp: 97.8 F (36.6 C)  97.7 F (36.5 C)   TempSrc:   Oral   Resp:   20   Height:      Weight:      SpO2:  96% 97%     Intake/Output Summary (Last 24 hours) at 02/10/13 1559 Last data filed at 02/10/13 1420  Gross per 24 hour  Intake   1240 ml  Output    302 ml  Net    938 ml   Filed Weights   02/08/13 1016 02/08/13 2159 02/09/13 2157  Weight: 70.7 kg (155 lb 13.8 oz)  70.7 kg (155 lb 13.8 oz) 71 kg (156 lb 8.4 oz)    Exam:   General:  No distress.   Cardiovascular: S 1, S 2 RRR  Respiratory: Crackles bases.   Abdomen: Bs present, soft, NT  Musculoskeletal: No edema.   Data Reviewed: Basic Metabolic Panel:  Recent Labs Lab 02/05/13 0710 02/05/13 1900 02/06/13 0500 02/07/13 0455 02/08/13 0658 02/09/13 0720  NA 134*  --  135 137 138 136  K 4.9  --  4.6 4.1 4.0 3.5  CL 104  --  99 100 99 96  CO2 15*  --  19 24 23 24   GLUCOSE 73  --  162* 132* 127* 115*  BUN 97*  --  72* 56* 93* 72*  CREATININE 10.71*  --  8.41* 6.07* 7.69* 5.58*  CALCIUM 8.8 8.3* 8.8 8.5 8.2* 8.4  PHOS  --   --  6.3* 6.0* 7.5*  --    Liver Function Tests:  Recent Labs Lab 02/04/13 2110 02/05/13 0710 02/06/13 0500 02/07/13 0455 02/08/13 0658  AST 16 15  --   --   --   ALT <5 <5  --   --   --   ALKPHOS 52 48  --   --   --   BILITOT 0.5 0.8  --   --   --  PROT 7.1 6.6  --   --   --   ALBUMIN 2.8* 2.5* 2.6* 2.5* 2.6*   No results found for this basename: LIPASE, AMYLASE,  in the last 168 hours No results found for this basename: AMMONIA,  in the last 168 hours CBC:  Recent Labs Lab 02/04/13 2110 02/05/13 0710 02/06/13 0500 02/07/13 0455 02/08/13 0658 02/09/13 0720  WBC 9.4 7.9 8.4 10.7* 10.6* 14.4*  NEUTROABS 7.0 6.4  --   --   --   --   HGB 7.0* 6.9* 10.1* 9.8* 10.0* 11.0*  HCT 20.2* 19.5* 27.5* 27.0* 27.9* 31.4*  MCV 82.1 80.2 77.2* 78.3 78.8 79.3  PLT 296 256 261 267 285 292   Cardiac Enzymes: No results found for this basename: CKTOTAL, CKMB, CKMBINDEX, TROPONINI,  in the last 168 hours BNP (last 3 results) No results found for this basename: PROBNP,  in the last 8760 hours CBG:  Recent Labs Lab 02/09/13 1133 02/09/13 1655 02/09/13 2159 02/10/13 0738 02/10/13 1213  GLUCAP 134* 171* 154* 85 89    Recent Results (from the past 240 hour(s))  CULTURE, BLOOD (ROUTINE X 2)     Status: None   Collection Time    02/04/13 11:46 PM       Result Value Range Status   Specimen Description BLOOD RIGHT HAND   Final   Special Requests BOTTLES DRAWN AEROBIC ONLY 10CC   Final   Culture  Setup Time 02/05/2013 06:13   Final   Culture     Final   Value:        BLOOD CULTURE RECEIVED NO GROWTH TO DATE CULTURE WILL BE HELD FOR 5 DAYS BEFORE ISSUING A FINAL NEGATIVE REPORT   Report Status PENDING   Incomplete  CULTURE, BLOOD (ROUTINE X 2)     Status: None   Collection Time    02/04/13 11:56 PM      Result Value Range Status   Specimen Description BLOOD LEFT ARM   Final   Special Requests BOTTLES DRAWN AEROBIC ONLY 10CC   Final   Culture  Setup Time 02/05/2013 06:13   Final   Culture     Final   Value:        BLOOD CULTURE RECEIVED NO GROWTH TO DATE CULTURE WILL BE HELD FOR 5 DAYS BEFORE ISSUING A FINAL NEGATIVE REPORT   Report Status PENDING   Incomplete  URINE CULTURE     Status: None   Collection Time    02/05/13  1:27 AM      Result Value Range Status   Specimen Description URINE, CLEAN CATCH   Final   Special Requests Normal   Final   Culture  Setup Time 02/05/2013 01:54   Final   Colony Count NO GROWTH   Final   Culture NO GROWTH   Final   Report Status 02/06/2013 FINAL   Final  MRSA PCR SCREENING     Status: None   Collection Time    02/05/13  2:32 AM      Result Value Range Status   MRSA by PCR NEGATIVE  NEGATIVE Final   Comment:            The GeneXpert MRSA Assay (FDA     approved for NASAL specimens     only), is one component of a     comprehensive MRSA colonization     surveillance program. It is not     intended to diagnose MRSA     infection  nor to guide or     monitor treatment for     MRSA infections.  CLOSTRIDIUM DIFFICILE BY PCR     Status: None   Collection Time    02/09/13  9:23 AM      Result Value Range Status   C difficile by pcr NEGATIVE  NEGATIVE Final     Studies: Dg Chest 2 View  02/10/2013  *RADIOLOGY REPORT*  Clinical Data: Hypertension, cough, Wegener's.  CHEST - 2 VIEW  Comparison:  02/07/2013 and CT chest 02/05/2013.  Findings: Trachea is midline.  Right IJ dialysis catheter tips project over the SVC.  Left-sided AICD lead tip projects over the right ventricle.  Heart size stable.  Lungs are somewhat low in volume with biapical pleural parenchymal scarring.  Interval improvement in mild diffuse interstitial prominence and indistinctness, as seen on 02/07/2013.  No pleural fluid.  IMPRESSION: Interval resolution of pulmonary hemorrhage.   Original Report Authenticated By: Lorin Picket, M.D.     Scheduled Meds: . calcium acetate  667 mg Oral TID WC  . darbepoetin (ARANESP) injection - DIALYSIS  100 mcg Intravenous Q Sat-HD  . insulin aspart  0-9 Units Subcutaneous TID WC  . insulin detemir  5 Units Subcutaneous QHS  . isoniazid  300 mg Oral Daily  . multivitamin  1 tablet Oral Daily  . pantoprazole  40 mg Oral Q1200  . predniSONE  60 mg Oral Q breakfast  . vitamin B-6  50 mg Oral Daily  . risperiDONE  0.5 mg Oral Daily  . sodium chloride  3 mL Intravenous Q12H  . sodium chloride  3 mL Intravenous Q12H  . sulfamethoxazole-trimethoprim  1 tablet Oral Q M,W,F-1800   Continuous Infusions: . sodium chloride    . sodium chloride Stopped (02/06/13 0039)    Principal Problem:   Wegener's disease, pulmonary Active Problems:   Other primary cardiomyopathies   PPD positive   ARF (acute renal failure)   Anemia   Pneumonia   GI bleed   Metabolic acidosis    Time spent: 35 minutes.     Kasyn Rolph  [facility] Pager [phone]. If 7PM-7AM, please contact night-coverage at [email or web address], password [facility] 02/10/2013, 3:59 PM  LOS: 6 days

## 2013-02-10 NOTE — Progress Notes (Signed)
PULMONARY  / CRITICAL CARE MEDICINE  Name: James Nicholson MRN: [id number] DOB: 11/25/1965    ADMISSION DATE:  02/04/2013 CONSULTATION DATE:  02/05/2013  REFERRING MD :  Hal Hope PRIMARY SERVICE: TRH  CHIEF COMPLAINT:  Weakness  BRIEF PATIENT DESCRIPTION: 47 y/o [place] male with P-ANCA + necrotizing GN was admitted on 4/10 for a flare of the same, characterized by renal failure and diffuse B infiltrates, epistaxis + hemoptysis  SIGNIFICANT EVENTS: 4/11 Admit, Abx, high dose solumedrol, renal consulted 4/12 Start cytoxan 4/16 AV fistula creation R UE  STUDIES:  4/11 CT chest >> b/l diffuse fluffy ASD sparing periphery 4/11 c-ANCA >>Positive / 1:320, p-ANCA >>>  LINES / TUBES: Rt IJ HD cath 4/11 >>   CULTURES: 4/11 blood >> 4/11 urine >>neg 4/12 Quantiferon gold >> indeterminate (control was low at 0.14) 4/15 C-Diff PCR>>> negative  ANTIBIOTICS: cefepime 4/11>> 4/12 vanc 4/11>>4/12 Bactrim (PCP proph) 4/14>>>  SUBJECTIVE:  On RA and comfortable Note poor response on Quant gold assay, indeterminate test  VITAL SIGNS: Temp:  [97.4 F (36.3 C)-98.6 F (37 C)] 97.7 F (36.5 C) (04/16 0556) Pulse Rate:  [73-84] 73 (04/16 0556) Resp:  [16] 16 (04/16 0556) BP: (106-134)/(63-75) 106/64 mmHg (04/16 0556) SpO2:  [96 %-100 %] 100 % (04/16 0930) FiO2 (%):  [100 %] 100 % (04/16 0930) Weight:  [156 lb 8.4 oz (71 kg)] 156 lb 8.4 oz (71 kg) (04/15 2157)  INTAKE / OUTPUT: Intake/Output     04/15 0701 - 04/16 0700 04/16 0701 - 04/17 0700   P.O. 1080    Total Intake(mL/kg) 1080 (15.2)    Urine (mL/kg/hr) 920 (0.5)    Other     Stool 3 (0)    Total Output 923     Net +157          Urine Occurrence 2 x    Stool Occurrence 1 x      PHYSICAL EXAMINATION: Gen: No distress, alert, limited English  HEENT: no sinus tenderness, no further epistaxis PULM:  Clear B CV: regular, no murmur, 2+ pulses AB: soft, non tender Ext: no edema Derm: no rashes Neuro: normal  strength x 4 extremities   LABS:  Recent Labs Lab 02/04/13 2110 02/04/13 2345 02/05/13 0146 02/05/13 0710 02/05/13 1216  02/06/13 0500 02/07/13 0455 02/08/13 0658 02/09/13 0720  HGB 7.0*  --   --  6.9*  --   --  10.1* 9.8* 10.0* 11.0*  WBC 9.4  --   --  7.9  --   --  8.4 10.7* 10.6* 14.4*  PLT 296  --   --  256  --   --  261 267 285 292  NA 133*  --   --  134*  --   --  135 137 138 136  K 5.7*  --   --  4.9  --   --  4.6 4.1 4.0 3.5  CL 103  --   --  104  --   --  99 100 99 96  CO2 15*  --   --  15*  --   --  19 24 23 24   GLUCOSE 124*  --   --  73  --   --  162* 132* 127* 115*  BUN 91*  --   --  97*  --   --  72* 56* 93* 72*  CREATININE 10.25*  --   --  10.71*  --   --  8.41* 6.07* 7.69* 5.58*  CALCIUM  8.8  --   --  8.8  --   < > 8.8 8.5 8.2* 8.4  PHOS  --   --   --   --   --   --  6.3* 6.0* 7.5*  --   AST 16  --   --  15  --   --   --   --   --   --   ALT <5  --   --  <5  --   --   --   --   --   --   ALKPHOS 52  --   --  48  --   --   --   --   --   --   BILITOT 0.5  --   --  0.8  --   --   --   --   --   --   PROT 7.1  --   --  6.6  --   --   --   --   --   --   ALBUMIN 2.8*  --   --  2.5*  --   --  2.6* 2.5* 2.6*  --   APTT  --   --   --   --  34  --   --   --   --   --   INR  --   --   --   --  1.30  --   --   --   --   --   LATICACIDVEN  --  0.7  --   --   --   --   --   --   --   --   PHART  --   --  7.289*  --   --   --   --   --   --   --   PCO2ART  --   --  29.0*  --   --   --   --   --   --   --   PO2ART  --   --  81.0  --   --   --   --   --   --   --   < > = values in this interval not displayed.  Recent Labs Lab 02/09/13 0804 02/09/13 1133 02/09/13 1655 02/09/13 2159 02/10/13 0738  GLUCAP 111* 134* 171* 154* 85    Imaging: Dg Chest 2 View  02/10/2013  *RADIOLOGY REPORT*  Clinical Data: Hypertension, cough, Wegener's.  CHEST - 2 VIEW  Comparison: 02/07/2013 and CT chest 02/05/2013.  Findings: Trachea is midline.  Right IJ dialysis catheter tips  project over the SVC.  Left-sided AICD lead tip projects over the right ventricle.  Heart size stable.  Lungs are somewhat low in volume with biapical pleural parenchymal scarring.  Interval improvement in mild diffuse interstitial prominence and indistinctness, as seen on 02/07/2013.  No pleural fluid.  IMPRESSION: Interval resolution of pulmonary hemorrhage.   Original Report Authenticated By: Lorin Picket, M.D.     ASSESSMENT / PLAN:  Hemoptysis in setting of b/l pulmonary infiltrates with hx of Wegener's granulomatosis.  Overall picture consistent w vasculitis and pulm hemorrhage, likely also component of pulmonary edema >> improved after HD. CXR has cleared 4/16 Plan: prednisone 60 mg daily from 4/14 Cytoxan given 4/12, further dosing per renal plans F/u ANCA >> p-anca pending PCP prophylaxis on 4/14 >> Bactrim Recommend repeat CT scan of the chest  in 2 months to assess for full clearance of infiltrates and to assess for residual nodular disease, particularly given positive PPD and indeterminate Quant gold assay  Acute renal failure in setting of Wegener's. Plan: HD per renal Fistula per [facility] on 4/16.  Hx of PPD positive, Quant gold is indeterminate (poor control response indicates anergic, on pred + cytoxan)  Plan: Recommend 6 months of INH. You could repeat the Quant gold test (probably should at some point) but suspect the results would remains un-interpretable since he has recently received immunosuppression.  Repeat CT scan of the chest in 2 months as above; will help identify any residual nodular disease (which could represent infxn vs Wegener's). I do not believe full clearance on CT scan would convince me to defer a full 6 months of INH. Could review the question with ID to get a second opinion about this.   Hx of HTN, VF s/p AICD. Plan: Per primary team  Steroid induced hyperglycemia. Plan: SSI while on steroids  Glycemic control adequate  Anemia >> s/p PRBC  4/11. Plan: F/u CBC HGB 11.0: 4/15 Transfuse for Hb < 7  PCCM will sign off. Please call if we can assist you in any way.   Noe Gens, NP-C [facility] Pgr: [phone] or [phone]  Baltazar Apo, MD, PhD 02/10/2013, 2:59 PM [facility] [phone] or if no answer [phone]

## 2013-02-10 NOTE — Transfer of Care (Signed)
Immediate Anesthesia Transfer of Care Note  Patient: James Nicholson  Procedure(s) Performed: Procedure(s) with comments: ARTERIOVENOUS (AV) FISTULA CREATION (Right) - Right forearm radial/cephalic arterovenous fistula.   Patient Location: PACU  Anesthesia Type:General  Level of Consciousness: awake, oriented and patient cooperative  Airway & Oxygen Therapy: Patient Spontanous Breathing  Post-op Assessment: Report given to PACU RN, Post -op Vital signs reviewed and stable and Patient moving all extremities X 4  Post vital signs: Reviewed and stable  Complications: No apparent anesthesia complications

## 2013-02-11 ENCOUNTER — Encounter (HOSPITAL_COMMUNITY): Payer: Self-pay | Admitting: Vascular Surgery

## 2013-02-11 DIAGNOSIS — J189 Pneumonia, unspecified organism: Secondary | ICD-10-CM

## 2013-02-11 DIAGNOSIS — N179 Acute kidney failure, unspecified: Secondary | ICD-10-CM

## 2013-02-11 DIAGNOSIS — D649 Anemia, unspecified: Secondary | ICD-10-CM

## 2013-02-11 DIAGNOSIS — E872 Acidosis: Secondary | ICD-10-CM

## 2013-02-11 LAB — GLUCOSE, CAPILLARY: Glucose-Capillary: 100 mg/dL — ABNORMAL HIGH (ref 70–99)

## 2013-02-11 LAB — CULTURE, BLOOD (ROUTINE X 2)
Culture: NO GROWTH
Culture: NO GROWTH

## 2013-02-11 LAB — TROPONIN I: Troponin I: <0.3 ng/mL (ref <0.30)

## 2013-02-11 MED ORDER — SULFAMETHOXAZOLE-TRIMETHOPRIM 400-80 MG PO TABS: 1.0000 | ORAL_TABLET | ORAL | Status: DC

## 2013-02-11 MED ORDER — GUAIFENESIN-DM 100-10 MG/5ML PO SYRP: 5.0000 mL | ORAL_SOLUTION | ORAL | Status: DC | PRN

## 2013-02-11 MED ORDER — INSULIN DETEMIR 100 UNIT/ML ~~LOC~~ SOLN: 5.0000 [IU] | Freq: Every day | SUBCUTANEOUS | Status: DC

## 2013-02-11 MED ORDER — CALCIUM ACETATE 667 MG PO CAPS: 667.0000 mg | ORAL_CAPSULE | Freq: Three times a day (TID) | ORAL | Status: DC

## 2013-02-11 MED ORDER — PREDNISONE 20 MG PO TABS: 60.0000 mg | ORAL_TABLET | Freq: Every day | ORAL | Status: DC

## 2013-02-11 NOTE — Progress Notes (Signed)
Impression/Recommendations  47 yo [place] male  Renal failure with h/o P-ANCA + necrotizing GN (Wegener's) and treatment with cytoxan/steroids 2010; now with recurrence with pulmonary involvement and prob ESRD,  S/p AVF and PC  .  On Steroids and Cytoxan per pulmonary. OP hemodialysis arranged as below:  "Hemodialysis Outpatient Note: this patient has been accepted at the [facility] on a Tues-Thurs-Sat 2nd shift schedule, pt can begin tomorrow at 11:00 if discharged today(unlikely), if not then he would need to be at the center on Friday at an arranged time to sign consent/paperwork and then begin treatment on Saturday. We would need to coordinate this with the center. Patient only speaks [place] and would need an interpreter to go to the center with him when signing paperwork."   Had HD today without event but c/o weakness now.  OK for discharge from renal perspective. Zadia Uhde C

## 2013-02-11 NOTE — Clinical Social Work Note (Addendum)
CSW intern received consult to speak with patients brother about transportation. Brother informed CSW intern that the patient needs transportation to and from dialysis. CSW intern provided the brother with contact information for [facility] [phone]) and encouraged him to contact them regarding dialysis transportation for his brother.  CSW intern spoke with Carylon Perches, Owner of [facility] who advised CSW that the facility will assist with transportation to and from dialysis center. Brother contacted and informed.   Lucius Conn, Social Work Intern 02/11/2013  02/11/13 - Co-signed: Khia Dieterich Givens, LCSW

## 2013-02-11 NOTE — Discharge Summary (Signed)
Physician Discharge Summary  James Nicholson [id number] DOB: May 19, 1966 DOA: 02/04/2013  PCP: [name], MD  Admit date: 02/04/2013 Discharge date: 02/11/2013  Time spent: 35  minutes  Recommendations for Outpatient Follow-up:  1. Need to complete treatment for latent TB, need to complete 6 month therapy.  2. Needs to follow up with renal and pulmonologist for further titration of prednisone.  3. Needs to have a CT chest repeat in 2 month.   Discharge Diagnoses:    Wegener's disease, pulmonary   ARF, now ESRD, new dialysis patient.    Other primary cardiomyopathies   PPD positive   ARF (acute renal failure)   Anemia   Pneumonia   GI bleed   Metabolic acidosis   Discharge Condition: Stable  Diet recommendation: Renal Diet.   Filed Weights   02/10/13 2110 02/11/13 0635 02/11/13 1045  Weight: 70.353 kg (155 lb 1.6 oz) 66.9 kg (147 lb 7.8 oz) 66.9 kg (147 lb 7.8 oz)    History of present illness:  James Nicholson is a 46 y.o. male was brought to the ER by patient's brother after patient was found to have increasing cough and lower extremity edema. Patient has history of schizophrenia and has difficulty communicating. Patient only speaks in [place] language. Patient's brother provided most of the history as Optometrist. As per patient's brother patient has had history of renal failure in 2010 where patient had required cyclophosphamide and prednisone. Patient was continued on prednisone for 5 months after which it was discontinued. At that time patient also had V. fib arrest and ICD was placed. Since last July patient has been staying in nursing home and PPD done at that time was positive and patient was placed on INH for 9 months.  Yesterday patient's brother was called by the nursing home staff as patient was found to be increasingly weak and complained of cough with lower extremity edema. Patient has been having cough for last 2 weeks and sometimes had coughed up blood.  Patient also has been having some bloody stools. Patient has been noticed to have increased difficulty walking because of the lower extremity edema. In addition patient has been having subjective feeling of fever and chills.  In the ER patient's chest x-ray showed possibility of pneumonia versus edema versus hemorrhage. Patient's hemoglobin is around 7 with creatinine around 10. Last labs in our system was in last July which was normal.  Patient's stool for occult blood was positive. Patient has been admitted for further management.   Hospital Course:  1. Wegener granulomatosis with epistaxis, alveolar hemorrhage and renal failure- fulminant - started HD 02/05/13. Received steroids iv pulse dose 1 g for 3 days and cyclophosphamide 500 mg iv once after HD 4/12 with improvement in epistaxis and hemoptysis. Now on maintenance dose prednisone 60 mg daily. No Further cytoxan after discussion with Dr Florene Glen and Dr Royston Cowper. Patient will be discharge on 60 mg of prednisone daily. He will need to follow up with nephrology and pulmonary for further titration of medication. Patient will need a CT chest in 2 months to follow resolution of infiltrates. I spoke with patient ' brother. He is aware of follow up needed.  P anca negative. C anca positive.  2. Latent TB: Positive PPD: continue isoniazide and B 6. Quantiferon TB gold indeterminate. Discussed with pulmonary will treat for 6 month. I discussed case with Dr Tommy Medal who agree with treatment for latent TB.  3. Acute blood loss anemia due to alveolar hemorrhage - transfused 1  unit of prbcs 02/05/13 . Hb stable.  4. Mild diarrhea 4/13 - probable from CTX - follow closely . c diff - negative. - started imodium  5. Dizziness: present on admission. Patient dizziness improved. Patient will need PT therapy.  6. History of VF arrest, SP defibrillator.  7. ESRD, new dialysis patient. See problem Number 1. Patient brother will go with patient tomorrow to dialysis center to  fill paper work.   Procedures: Right Cimino radiocephalic AV fistula [id number]   Consultations: Pulmonary  Renal.    Discharge Exam: Filed Vitals:   02/11/13 0930 02/11/13 1000 02/11/13 1030 02/11/13 1045  BP: 131/65 121/66 111/66 123/68  Pulse: 70 78 81 70  Temp:      TempSrc:      Resp: 13 12 12 14   Height:      Weight:    66.9 kg (147 lb 7.8 oz)  SpO2:    98%    General: no distress. Cardiovascular: S 1, S 2 RRR Respiratory: CTA  Discharge Instructions  Discharge Orders   Future Appointments Provider Department Dept Phone   03/09/2013 11:45 AM Rosetta Posner, MD [facility] [phone]   03/31/2013 9:00 AM [facility] [address] [place] [place]) [phone]   Future Orders Complete By Expires     Increase activity slowly  As directed         Medication List    STOP taking these medications       gabapentin 300 MG capsule  Commonly known as:  NEURONTIN     naproxen 500 MG tablet  Commonly known as:  NAPROSYN      TAKE these medications       calcium acetate 667 MG capsule  Commonly known as:  PHOSLO  Take 1 capsule (667 mg total) by mouth 3 (three) times daily with meals.     guaiFENesin-dextromethorphan 100-10 MG/5ML syrup  Commonly known as:  ROBITUSSIN DM  Take 5 mLs by mouth every 4 (four) hours as needed for cough.     insulin detemir 100 UNIT/ML injection  Commonly known as:  LEVEMIR  Inject 0.05 mLs (5 Units total) into the skin at bedtime.     isoniazid 300 MG tablet  Commonly known as:  NYDRAZID  Take 300 mg by mouth daily.     omeprazole 20 MG capsule  Commonly known as:  PRILOSEC  Take 20 mg by mouth daily.     predniSONE 20 MG tablet  Commonly known as:  DELTASONE  Take 3 tablets (60 mg total) by mouth daily with breakfast.     pyridOXINE 50 MG tablet  Commonly known as:  B-6  Take 1 tablet (50 mg total) by mouth daily.     risperiDONE 0.5 MG tablet  Commonly known as:  RISPERDAL   Take 0.5 mg by mouth daily.     sulfamethoxazole-trimethoprim 400-80 MG per tablet  Commonly known as:  BACTRIM,SEPTRA  Take 1 tablet by mouth every Monday, Wednesday, and Friday at 6 PM.     traMADol 50 MG tablet  Commonly known as:  ULTRAM  Take 50 mg by mouth every 6 (six) hours as needed for pain.           Follow-up Information   Follow up with EARLY, TODD, MD In 4 weeks. (office will arrange-sent)    Contact information:   [place] [address] [phone]        The results of significant diagnostics from  this hospitalization (including imaging, microbiology, ancillary and laboratory) are listed below for reference.    Significant Diagnostic Studies: Ct Abdomen Pelvis Wo Contrast  02/05/2013  *RADIOLOGY REPORT*  Clinical Data:  Productive cough, lower abdominal pain and lower leg pain; generalized weakness.  CT CHEST, ABDOMEN AND PELVIS WITHOUT CONTRAST  Technique:  Multidetector CT imaging of the chest, abdomen and pelvis was performed following the standard protocol without IV contrast.  Comparison:  Chest radiograph performed 02/04/2013  CT CHEST  Findings:  There is diffuse opacification involving both lungs, relatively fluffy in appearance, with peripheral sparing.  This may reflect diffuse pneumonia or possibly pulmonary alveolar hemorrhage.  The appearance is somewhat less typical for pulmonary edema.  No pleural effusion or pneumothorax is seen.  No definite masses are identified.  The heart is borderline enlarged.  An AICD lead is noted ending at the right ventricle.  No mediastinal lymphadenopathy is seen.  No pericardial effusion is identified.  The great vessels are grossly unremarkable in appearance.  The thyroid gland is grossly unremarkable in appearance.  No axillary lymphadenopathy is seen.  No acute osseous abnormalities are identified.  IMPRESSION:  1.  Diffuse opacification involving both lungs, with peripheral sparing.  This may reflect diffuse  pneumonia or possibly pulmonary alveolar hemorrhage.  The appearance is somewhat less typical for pulmonary edema, though it remains a possibility. 2.  Borderline cardiomegaly.  CT ABDOMEN AND PELVIS  Findings:  The liver and spleen are unremarkable in appearance. The gallbladder is within normal limits.  The pancreas is somewhat prominent, though this likely remains within normal limits.  The adrenal glands are unremarkable in appearance.  The kidneys are unremarkable in appearance.  There is no evidence of hydronephrosis.  No renal or ureteral stones are seen.  No perinephric stranding is appreciated.  No free fluid is identified.  The small bowel is unremarkable in appearance.  The stomach is within normal limits.  No acute vascular abnormalities are seen.  Mildly prominent periaortic nodes are seen, of uncertain significance.  The appendix appears grossly normal in caliber, without evidence for appendicitis.  Contrast progresses to the level of the rectum. The colon is unremarkable in appearance.  The bladder is moderately distended and grossly unremarkable in appearance; a small urachal remnant is incidentally noted.  The prostate remains normal in size.  No inguinal lymphadenopathy is seen.  No acute osseous abnormalities are identified.  IMPRESSION:  1.  No acute abnormality seen within the abdomen or pelvis. 2.  Mildly prominent periaortic nodes, of uncertain significance.   Original Report Authenticated By: Santa Lighter, M.D.    Dg Chest 2 View  02/10/2013  *RADIOLOGY REPORT*  Clinical Data: Hypertension, cough, Wegener's.  CHEST - 2 VIEW  Comparison: 02/07/2013 and CT chest 02/05/2013.  Findings: Trachea is midline.  Right IJ dialysis catheter tips project over the SVC.  Left-sided AICD lead tip projects over the right ventricle.  Heart size stable.  Lungs are somewhat low in volume with biapical pleural parenchymal scarring.  Interval improvement in mild diffuse interstitial prominence and  indistinctness, as seen on 02/07/2013.  No pleural fluid.  IMPRESSION: Interval resolution of pulmonary hemorrhage.   Original Report Authenticated By: Lorin Picket, M.D.    Dg Chest 2 View  02/04/2013  *RADIOLOGY REPORT*  Clinical Data: Fever.  Hemoptysis.  CHEST - 2 VIEW  Comparison: Two-view chest 05/03/2012.  Findings: The heart size is exaggerated by low lung volumes.  A single lead AICD is stable.  Patchy interstitial  and airspace disease is present bilaterally.  The visualized soft tissues and bony thorax are unremarkable.  IMPRESSION:  1.  New patchy interstitial airspace disease.  Differential diagnosis includes edema, hemorrhage, or infection. 2.  Low lung volumes.   Original Report Authenticated By: San Morelle, M.D.    Ct Chest Wo Contrast  02/05/2013  *RADIOLOGY REPORT*  Clinical Data:  Productive cough, lower abdominal pain and lower leg pain; generalized weakness.  CT CHEST, ABDOMEN AND PELVIS WITHOUT CONTRAST  Technique:  Multidetector CT imaging of the chest, abdomen and pelvis was performed following the standard protocol without IV contrast.  Comparison:  Chest radiograph performed 02/04/2013  CT CHEST  Findings:  There is diffuse opacification involving both lungs, relatively fluffy in appearance, with peripheral sparing.  This may reflect diffuse pneumonia or possibly pulmonary alveolar hemorrhage.  The appearance is somewhat less typical for pulmonary edema.  No pleural effusion or pneumothorax is seen.  No definite masses are identified.  The heart is borderline enlarged.  An AICD lead is noted ending at the right ventricle.  No mediastinal lymphadenopathy is seen.  No pericardial effusion is identified.  The great vessels are grossly unremarkable in appearance.  The thyroid gland is grossly unremarkable in appearance.  No axillary lymphadenopathy is seen.  No acute osseous abnormalities are identified.  IMPRESSION:  1.  Diffuse opacification involving both lungs, with  peripheral sparing.  This may reflect diffuse pneumonia or possibly pulmonary alveolar hemorrhage.  The appearance is somewhat less typical for pulmonary edema, though it remains a possibility. 2.  Borderline cardiomegaly.  CT ABDOMEN AND PELVIS  Findings:  The liver and spleen are unremarkable in appearance. The gallbladder is within normal limits.  The pancreas is somewhat prominent, though this likely remains within normal limits.  The adrenal glands are unremarkable in appearance.  The kidneys are unremarkable in appearance.  There is no evidence of hydronephrosis.  No renal or ureteral stones are seen.  No perinephric stranding is appreciated.  No free fluid is identified.  The small bowel is unremarkable in appearance.  The stomach is within normal limits.  No acute vascular abnormalities are seen.  Mildly prominent periaortic nodes are seen, of uncertain significance.  The appendix appears grossly normal in caliber, without evidence for appendicitis.  Contrast progresses to the level of the rectum. The colon is unremarkable in appearance.  The bladder is moderately distended and grossly unremarkable in appearance; a small urachal remnant is incidentally noted.  The prostate remains normal in size.  No inguinal lymphadenopathy is seen.  No acute osseous abnormalities are identified.  IMPRESSION:  1.  No acute abnormality seen within the abdomen or pelvis. 2.  Mildly prominent periaortic nodes, of uncertain significance.   Original Report Authenticated By: Santa Lighter, M.D.    US Renal  02/05/2013  *RADIOLOGY REPORT*  Clinical Data: Renal failure  RENAL/URINARY TRACT ULTRASOUND  Technique: Renal ultrasound  Comparison:  None.  Findings: Both kidneys demonstrate normal echogenicity.  There is no cyst,  mass, stone or hydronephrosis.  Right kidney is 12 cm in length.  Left kidney is 10 cm in length.  Bladder is normal.  IMPRESSION: No specific abnormalities.  There is mild renal size asymmetry. This is a  nonspecific finding that can at times be related to vascular insufficiency.,   Original Report Authenticated By: Skipper Cliche, M.D.    Ir Fluoro Guide Cv Line Right  02/05/2013  *RADIOLOGY REPORT*  Clinical Data: Renal failure.  The patient requires a  tunneled dialysis catheter for hemodialysis.  TUNNELED CENTRAL VENOUS HEMODIALYSIS CATHETER PLACEMENT WITH ULTRASOUND AND FLUOROSCOPIC GUIDANCE  Sedation:  1.0 mg IV Versed; 75 mcg IV Fentanyl.  Total Moderate Sedation Time:  10 minutes.  Additional Medications:  1 gram IV Ancef.  As antibiotic prophylaxis, Ancef was ordered pre-procedure and administered intravenously within one hour of incision.  Fluoroscopy Time:  1.2 minutes.  Procedure:  The procedure, risks, benefits, and alternatives were explained to the patient.  Questions regarding the procedure were encouraged and answered.  The patient understands and consents to the procedure.  The right neck and chest were prepped with chlorhexidine in a sterile fashion, and a sterile drape was applied covering the operative field.  Maximum barrier sterile technique with sterile gowns and gloves were used for the procedure.  Local anesthesia was provided with 1% lidocaine.  After creating a small venotomy incision, a 21 gauge needle was advanced into the right internal jugular vein under direct, real- time ultrasound guidance.  Ultrasound image documentation was performed.  After securing guidewire access, an 8 Fr dilator was placed.  A J-wire was kinked to measure appropriate catheter length.  A Bard HemoSplit tunneled hemodialysis catheter measuring 19 cm from tip to cuff was chosen for placement.  This was tunneled in a retrograde fashion from the chest wall to the venotomy incision.  At the venotomy, serial dilatation was performed and a 16 Fr peel- away sheath was placed over a guidewire.  The catheter was then placed through the sheath and the sheath removed.  Final catheter positioning was confirmed and  documented with a fluoroscopic spot image.  The catheter was aspirated, flushed with saline, and injected with appropriate volume heparin dwells.  The venotomy incision was closed with subcuticular 4-0 Vicryl. Dermabond was applied to the incision.  The catheter exit site was secured with 0-Prolene retention sutures.  Complications: None.  No pneumothorax.  Findings:  After catheter placement, the tips lie in the right atrium.  The catheter aspirates normally and is ready for immediate use.  IMPRESSION:  Placement of tunneled hemodialysis catheter via the right internal jugular vein.  The catheter tips lie in the right atrium.  The catheter is ready for immediate use.   Original Report Authenticated By: Aletta Edouard, M.D.    Ir US Guide Vasc Access Right  02/05/2013  *RADIOLOGY REPORT*  Clinical Data: Renal failure.  The patient requires a tunneled dialysis catheter for hemodialysis.  TUNNELED CENTRAL VENOUS HEMODIALYSIS CATHETER PLACEMENT WITH ULTRASOUND AND FLUOROSCOPIC GUIDANCE  Sedation:  1.0 mg IV Versed; 75 mcg IV Fentanyl.  Total Moderate Sedation Time:  10 minutes.  Additional Medications:  1 gram IV Ancef.  As antibiotic prophylaxis, Ancef was ordered pre-procedure and administered intravenously within one hour of incision.  Fluoroscopy Time:  1.2 minutes.  Procedure:  The procedure, risks, benefits, and alternatives were explained to the patient.  Questions regarding the procedure were encouraged and answered.  The patient understands and consents to the procedure.  The right neck and chest were prepped with chlorhexidine in a sterile fashion, and a sterile drape was applied covering the operative field.  Maximum barrier sterile technique with sterile gowns and gloves were used for the procedure.  Local anesthesia was provided with 1% lidocaine.  After creating a small venotomy incision, a 21 gauge needle was advanced into the right internal jugular vein under direct, real- time ultrasound guidance.   Ultrasound image documentation was performed.  After securing guidewire access, an 8 Fr dilator  was placed.  A J-wire was kinked to measure appropriate catheter length.  A Bard HemoSplit tunneled hemodialysis catheter measuring 19 cm from tip to cuff was chosen for placement.  This was tunneled in a retrograde fashion from the chest wall to the venotomy incision.  At the venotomy, serial dilatation was performed and a 16 Fr peel- away sheath was placed over a guidewire.  The catheter was then placed through the sheath and the sheath removed.  Final catheter positioning was confirmed and documented with a fluoroscopic spot image.  The catheter was aspirated, flushed with saline, and injected with appropriate volume heparin dwells.  The venotomy incision was closed with subcuticular 4-0 Vicryl. Dermabond was applied to the incision.  The catheter exit site was secured with 0-Prolene retention sutures.  Complications: None.  No pneumothorax.  Findings:  After catheter placement, the tips lie in the right atrium.  The catheter aspirates normally and is ready for immediate use.  IMPRESSION:  Placement of tunneled hemodialysis catheter via the right internal jugular vein.  The catheter tips lie in the right atrium.  The catheter is ready for immediate use.   Original Report Authenticated By: Aletta Edouard, M.D.    Dg Chest Port 1 View  02/07/2013  *RADIOLOGY REPORT*  Clinical Data: Wegener's disease.  Cardiomyopathy.  Acute renal failure.  PORTABLE CHEST - 1 VIEW  Comparison: 02/04/2013  Findings: Cardiomegaly stable.  AICD remains in appropriate position.  A new right jugular dual lumen center venous dialysis catheter is seen in appropriate position.  No pneumothorax identified.  Mild improvement in patchy airspace disease is seen since previous study, consistent with decreased pulmonary edema.  No evidence of pleural effusion.  IMPRESSION:  1.  Mild improvement in patchy bilateral airspace disease/edema. 2.  Stable  cardiomegaly.   Original Report Authenticated By: Earle Gell, M.D.     Microbiology: Recent Results (from the past 240 hour(s))  CULTURE, BLOOD (ROUTINE X 2)     Status: None   Collection Time    02/04/13 11:46 PM      Result Value Range Status   Specimen Description BLOOD RIGHT HAND   Final   Special Requests BOTTLES DRAWN AEROBIC ONLY 10CC   Final   Culture  Setup Time 02/05/2013 06:13   Final   Culture NO GROWTH 5 DAYS   Final   Report Status 02/11/2013 FINAL   Final  CULTURE, BLOOD (ROUTINE X 2)     Status: None   Collection Time    02/04/13 11:56 PM      Result Value Range Status   Specimen Description BLOOD LEFT ARM   Final   Special Requests BOTTLES DRAWN AEROBIC ONLY 10CC   Final   Culture  Setup Time 02/05/2013 06:13   Final   Culture NO GROWTH 5 DAYS   Final   Report Status 02/11/2013 FINAL   Final  URINE CULTURE     Status: None   Collection Time    02/05/13  1:27 AM      Result Value Range Status   Specimen Description URINE, CLEAN CATCH   Final   Special Requests Normal   Final   Culture  Setup Time 02/05/2013 01:54   Final   Colony Count NO GROWTH   Final   Culture NO GROWTH   Final   Report Status 02/06/2013 FINAL   Final  MRSA PCR SCREENING     Status: None   Collection Time    02/05/13  2:32 AM  Result Value Range Status   MRSA by PCR NEGATIVE  NEGATIVE Final   Comment:            The GeneXpert MRSA Assay (FDA     approved for NASAL specimens     only), is one component of a     comprehensive MRSA colonization     surveillance program. It is not     intended to diagnose MRSA     infection nor to guide or     monitor treatment for     MRSA infections.  CLOSTRIDIUM DIFFICILE BY PCR     Status: None   Collection Time    02/09/13  9:23 AM      Result Value Range Status   C difficile by pcr NEGATIVE  NEGATIVE Final     Labs: Basic Metabolic Panel:  Recent Labs Lab 02/05/13 0710  02/06/13 0500 02/07/13 0455 02/08/13 0658 02/09/13 0720  02/10/13 1747  NA 134*  --  135 137 138 136 135  K 4.9  --  4.6 4.1 4.0 3.5 3.9  CL 104  --  99 100 99 96 95*  CO2 15*  --  19 24 23 24 21   GLUCOSE 73  --  162* 132* 127* 115* 192*  BUN 97*  --  72* 56* 93* 72* 106*  CREATININE 10.71*  --  8.41* 6.07* 7.69* 5.58* 7.27*  CALCIUM 8.8  < > 8.8 8.5 8.2* 8.4 8.1*  MG  --   --   --   --   --   --  2.2  PHOS  --   --  6.3* 6.0* 7.5*  --   --   < > = values in this interval not displayed. Liver Function Tests:  Recent Labs Lab 02/04/13 2110 02/05/13 0710 02/06/13 0500 02/07/13 0455 02/08/13 0658  AST 16 15  --   --   --   ALT <5 <5  --   --   --   ALKPHOS 52 48  --   --   --   BILITOT 0.5 0.8  --   --   --   PROT 7.1 6.6  --   --   --   ALBUMIN 2.8* 2.5* 2.6* 2.5* 2.6*   No results found for this basename: LIPASE, AMYLASE,  in the last 168 hours No results found for this basename: AMMONIA,  in the last 168 hours CBC:  Recent Labs Lab 02/04/13 2110 02/05/13 0710 02/06/13 0500 02/07/13 0455 02/08/13 0658 02/09/13 0720  WBC 9.4 7.9 8.4 10.7* 10.6* 14.4*  NEUTROABS 7.0 6.4  --   --   --   --   HGB 7.0* 6.9* 10.1* 9.8* 10.0* 11.0*  HCT 20.2* 19.5* 27.5* 27.0* 27.9* 31.4*  MCV 82.1 80.2 77.2* 78.3 78.8 79.3  PLT 296 256 261 267 285 292   Cardiac Enzymes:  Recent Labs Lab 02/10/13 1747 02/10/13 2256 02/11/13 1121  TROPONINI <0.30 <0.30 <0.30   BNP: BNP (last 3 results) No results found for this basename: PROBNP,  in the last 8760 hours CBG:  Recent Labs Lab 02/10/13 0738 02/10/13 1213 02/10/13 1715 02/10/13 2108 02/11/13 1116  GLUCAP 85 89 165* 182* 100*       Signed:  Rhiley Tarver  [facility] 02/11/2013, 1:30 PM

## 2013-02-11 NOTE — Progress Notes (Signed)
  VASCULAR AND VEIN SURGERY PROGRESS NOTE  POST-OP HEMODIALYSIS ACCESS  Date of Surgery: 02/04/2013 - 02/10/2013 Surgeon: Juliann Mule): Rosetta Posner, MD 1 Day Post-Op Right Procedure(s): ARTERIOVENOUS (AV) FISTULA CREATION   HPI: James Nicholson is a 47 y.o. male who is 1 Day Post-Op creation/revision of right upper extremity Hemodialysis access. The patient denies symptoms of numbness, tingling, weakness; denies pain in the operative limb.   Significant Diagnostic Studies: CBC Lab Results  Component Value Date   WBC 14.4* 02/09/2013   HGB 11.0* 02/09/2013   HCT 31.4* 02/09/2013   MCV 79.3 02/09/2013   PLT 292 02/09/2013    BMET    Component Value Date/Time   NA 135 02/10/2013 1747   K 3.9 02/10/2013 1747   CL 95* 02/10/2013 1747   CO2 21 02/10/2013 1747   GLUCOSE 192* 02/10/2013 1747   BUN 106* 02/10/2013 1747   CREATININE 7.27* 02/10/2013 1747   CREATININE 1.15 05/22/2012 1052   CALCIUM 8.1* 02/10/2013 1747   CALCIUM 8.3* 02/05/2013 1900   GFRNONAA 8* 02/10/2013 1747   GFRAA 9* 02/10/2013 1747    COAG Lab Results  Component Value Date   INR 1.30 02/05/2013   No results found for this basename: PTT    Vital Signs  BP Readings from Last 3 Encounters:  02/11/13 121/66  02/11/13 121/66  06/23/12 107/74   Temp Readings from Last 3 Encounters:  02/11/13 97.4 F (36.3 C) Oral  02/11/13 97.4 F (36.3 C) Oral  06/23/12 98.1 F (36.7 C) Oral   SpO2 Readings from Last 3 Encounters:  02/11/13 98%  02/11/13 98%  05/03/12 99%   Pulse Readings from Last 3 Encounters:  02/11/13 78  02/11/13 78  06/23/12 59     Physical Examination  right upper Incision is healing well, skin color is normal , hand grip is 5/5, sensation in digits is intact;  There is a good thrill and good bruit in the radial- cephalic AVF.  Assessment/Plan James Nicholson is a 47 y.o. year old male who presents s/p creation/revision of right upper extremity Hemodialysis access. Follow-up in 4  weeks  The patient's access will be ready for use in 12 weeks.  Blanchard J 02/11/2013 10:27 AM

## 2013-02-11 NOTE — Progress Notes (Signed)
Nutrition Education Note  RD provided diet education with brother, who speaks [place]. He states that pt lives in a group home and he would provide the information to them when d/c.  RD consulted for Renal Education. Provided Choose-A-Meal Booklet to patient/family. Reviewed food groups and provided written recommended serving sizes specifically determined for patient's current nutritional status.   Explained why diet restrictions are needed and provided lists of foods to limit/avoid that are high potassium, sodium, and phosphorus. Provided specific recommendations on safer alternatives of these foods. Strongly encouraged compliance of this diet.   Discussed importance of protein intake at each meal and snack. Provided examples of how to maximize protein intake throughout the day. Discussed need for fluid restriction with dialysis, importance of minimizing weight gain between HD treatments, and renal-friendly beverage options.  Encouraged pt to discuss specific diet questions/concerns with RD at HD outpatient facility. Teach back method used.  Expect poor compliance.  Body mass index is 23.82 kg/(m^2). Pt meets criteria for normal weight based on current BMI.  Current diet order is Renal. Labs and medications reviewed. No further nutrition interventions warranted at this time. RD contact information provided. If additional nutrition issues arise, please re-consult RD.  Inda Coke MS, RD, LDN Pager: [phone] After-hours pager: [phone]

## 2013-03-04 ENCOUNTER — Telehealth: Payer: Self-pay

## 2013-03-04 NOTE — Telephone Encounter (Signed)
Spoke with Gerald Stabs in regards to scheduling appts for pt. Pt seen by RB in hospital and told to schedule HFU at our office in the next two week. Nothing available with RB that close--scheduled with TP.  Scheduled for HFU with TP 03/12/13 at 11:15--pt seen by RB in hospital Scheduled for Consult with [facility] 03/31/13 at 3p --- aware to arrive early to fill out paperwork.  Will send to [facility] as FYI so there is no confusion.

## 2013-03-08 ENCOUNTER — Other Ambulatory Visit: Payer: Self-pay | Admitting: Nephrology

## 2013-03-08 ENCOUNTER — Ambulatory Visit
Admission: RE | Admit: 2013-03-08 | Discharge: 2013-03-08 | Disposition: A | Discharge status: Home / Self Care | Payer: Medicaid Other | Source: Ambulatory Visit | Attending: Nephrology | Admitting: Nephrology

## 2013-03-08 ENCOUNTER — Encounter: Payer: Self-pay | Admitting: Vascular Surgery

## 2013-03-08 DIAGNOSIS — Z992 Dependence on renal dialysis: Secondary | ICD-10-CM

## 2013-03-08 DIAGNOSIS — R05 Cough: Secondary | ICD-10-CM

## 2013-03-08 DIAGNOSIS — R059 Cough, unspecified: Secondary | ICD-10-CM

## 2013-03-09 ENCOUNTER — Ambulatory Visit: Payer: Medicaid Other | Admitting: Vascular Surgery

## 2013-03-12 ENCOUNTER — Ambulatory Visit (INDEPENDENT_AMBULATORY_CARE_PROVIDER_SITE_OTHER): Payer: Medicaid Other | Admitting: Adult Health

## 2013-03-12 ENCOUNTER — Other Ambulatory Visit (INDEPENDENT_AMBULATORY_CARE_PROVIDER_SITE_OTHER): Payer: Medicaid Other

## 2013-03-12 VITALS — BP 120/72 | HR 88 | Temp 98.3°F | Ht 69.0 in | Wt 149.6 lb

## 2013-03-12 DIAGNOSIS — M3130 Wegener's granulomatosis without renal involvement: Secondary | ICD-10-CM

## 2013-03-12 DIAGNOSIS — R7611 Nonspecific reaction to tuberculin skin test without active tuberculosis: Secondary | ICD-10-CM

## 2013-03-12 DIAGNOSIS — N179 Acute kidney failure, unspecified: Secondary | ICD-10-CM

## 2013-03-12 LAB — CBC WITH DIFFERENTIAL/PLATELET
Basophils Absolute: 0 10*3/uL (ref 0.0–0.1)
Basophils Relative: 0.6 % (ref 0.0–3.0)
Eosinophils Absolute: 0 10*3/uL (ref 0.0–0.7)
Eosinophils Relative: 0.7 % (ref 0.0–5.0)
HCT: 32.5 % — ABNORMAL LOW (ref 39.0–52.0)
Hemoglobin: 11.1 g/dL — ABNORMAL LOW (ref 13.0–17.0)
Lymphocytes Relative: 31.1 % (ref 12.0–46.0)
Lymphs Abs: 1.7 10*3/uL (ref 0.7–4.0)
MCHC: 34.2 g/dL (ref 30.0–36.0)
MCV: 89.3 fl (ref 78.0–100.0)
Monocytes Absolute: 0.7 10*3/uL (ref 0.1–1.0)
Monocytes Relative: 13 % — ABNORMAL HIGH (ref 3.0–12.0)
Neutro Abs: 3 10*3/uL (ref 1.4–7.7)
Neutrophils Relative %: 54.6 % (ref 43.0–77.0)
Platelets: 155 10*3/uL (ref 150.0–400.0)
RBC: 3.64 Mil/uL — ABNORMAL LOW (ref 4.22–5.81)
RDW: 17.9 % — ABNORMAL HIGH (ref 11.5–14.6)
WBC: 5.4 10*3/uL (ref 4.5–10.5)

## 2013-03-12 LAB — BASIC METABOLIC PANEL
BUN: 15 mg/dL (ref 6–23)
CO2: 33 mEq/L — ABNORMAL HIGH (ref 19–32)
Calcium: 8.2 mg/dL — ABNORMAL LOW (ref 8.4–10.5)
Chloride: 98 mEq/L (ref 96–112)
Creatinine, Ser: 3.4 mg/dL — ABNORMAL HIGH (ref 0.4–1.5)
GFR: 20.65 mL/min — ABNORMAL LOW (ref >60.00)
Glucose, Bld: 70 mg/dL (ref 70–99)
Potassium: 4.5 mEq/L (ref 3.5–5.1)
Sodium: 135 mEq/L (ref 135–145)

## 2013-03-12 MED ORDER — PREDNISONE 20 MG PO TABS: ORAL_TABLET | ORAL | Status: DC

## 2013-03-12 NOTE — Patient Instructions (Addendum)
Restart Prednisone 60mg  daily for 1 week, Decrease prednisone 50mg  daily for 1 week then hold at prednisone 40 mg daily until seen back in office.  Needs follow up with ID clinic , Dr. Linus Salmons for follow up for TB in next 1-2 weeks.  Needs ASAP appointment with PCP -Dr. Vista Lawman for Diabetes follow up  Will send order for syringes and glucometer today but will need to be followed by family doctor for Diabetes.  Check blood sugars Twice daily  , call primary doctor for blood sugars >200 or <80 - follow up in 2 weeks with Dr. Lamonte Sakai  As planned -please do not miss this follow up  Please contact office for sooner follow up if symptoms do not improve or worsen or seek emergency care  Call if develop severe weakness, dizziness or urinary frequency or excessive thirst.

## 2013-03-12 NOTE — Progress Notes (Signed)
Subjective:    Patient ID: James Nicholson, male    DOB: Nov 07, 1965, 47 y.o.   MRN: [id number]  HPI 47 yo male group home pt  with known hx of Wegener's Dz , Renal failure  H/o biopsy proven P-ANCA positive necrotizing glomerulonephritis treated with cytoxan and steroids in 2010 in [place] with creatinine of 1 post treatment Seen for initial pulmonary consult during hospital admit on 4/10//2014 for wegners granulamatosis w/ epistaxis, alveolar hemorrhage and renal failure. Followed by ID clinic for latent TB on active INH therapy (f/by Dr. Real Cons on [facility] 05/22/12 x 9 months)  Hx of V-Fib arrest , CHF s/p ICD implant (f/by Dr. Lovena Le [facility]) Hx of Schizophrenia -group home pt    03/12/2013 [facility] follow up  [place] and english.   Understands english well.  Pt returns for a post hospital follow up  Admitted for wegners granulamatosis w/ epistaxis, alveolar hemorrhage and renal failure.4/10-4/17/14 .   Started on HD on 02/05/13. Started on steroids burst. Discharged on prednisone 60mg  daily  Pulmonary consulted during admission for hemoptysis/bilateral pulmonary infiltrates.  Since discharge he is feeling much better w/ decreased/resolved cough. No hemoptysis.  CT chest showed Diffuse opacification involving both lungs, with peripheral sparing recs to repeat CT in 2 months  CXR on 03/08/13 with improvement , NAD . Prendisone was stopped abruptly on 5/8 , no refills.  He is a resident at [facility], meds are administered by [facility] employee . Says they get meds thru pharmacy and have to have refills to cont rx.  Seen by renal with HD on Tues/Thurs/Sat .  Maturing AV graft on right arm.  Has not seen PCP since discharge , no blood sugars are being checked.  On Levemir 5 units At bedtime .  No hemoptysis, chest pain, orthopnea, n/v, hematuria, cough, fever, night sweats.      Review of Systems Constitutional:   No  weight loss, night sweats,  Fevers, chills, fatigue, or   lassitude.  HEENT:   No headaches,  Difficulty swallowing,  Tooth/dental problems, or  Sore throat,                No sneezing, itching, ear ache, nasal congestion, post nasal drip,   CV:  No chest pain,  Orthopnea, PND, swelling in lower extremities, anasarca, dizziness, palpitations, syncope.   GI  No heartburn, indigestion, abdominal pain, nausea, vomiting, diarrhea, change in bowel habits, loss of appetite, bloody stools.   Resp: No shortness of breath with exertion or at rest.  No excess mucus, no productive cough,  No non-productive cough,  No coughing up of blood.  No change in color of mucus.  No wheezing.  No chest wall deformity  Skin: no rash or lesions.  GU: no dysuria, change in color of urine, no urgency or frequency.  No flank pain, no hematuria   MS:  No joint pain or swelling.  No decreased range of motion.  No back pain.  Psych:  No change in mood or affect. No depression or anxiety.  No memory loss.         Objective:   Physical Exam GEN: A/Ox3; pleasant , NAD, thin  HEENT:  [place]/AT,  EACs-clear, TMs-wnl, NOSE-clear, THROAT-clear, no lesions, no postnasal drip or exudate noted.   NECK:  Supple w/ fair ROM; no JVD; normal carotid impulses w/o bruits; no thyromegaly or nodules palpated; no lymphadenopathy.  RESP  Clear  P & A; w/o, wheezes/ rales/ or rhonchi.no accessory muscle  use, no dullness to percussion  CARD:  RRR, no m/r/g  , no peripheral edema, pulses intact, no cyanosis or clubbing. Left chest wall implantable ICD Right IJ HD cath  Right wrist AV fistula +thrill/bruit (02/10/13 -Early)   GI:   Soft & nt; nml bowel sounds; no organomegaly or masses detected.  Musco: Warm bil, no deformities or joint swelling noted.   Neuro: alert, no focal deficits noted.    Skin: Warm, no lesions or rashes         Assessment & Plan:

## 2013-03-12 NOTE — Assessment & Plan Note (Signed)
Hx of Latent TB w/ +PPD  (Followed by ID clinic for latent TB on active INH therapy (f/by Dr. Real Cons on [facility] 05/22/12 x 9 months)  Cont on INH, need ID follow up

## 2013-03-12 NOTE — Assessment & Plan Note (Signed)
Recent flare with alveloar hemorrhage and acute renal failure  Now on HD.  CXR is steadily improving  Steroid responsive.  Pt is GH pt and steroids were stopped abruptly without apparent severe side effects as of yet  Will restart steroids w/ slow taper as he has had 4 weeks of high dose steroids at 60mg  , cont 1 additonal week at 60mg  daily  Then slow taper to 40mg  and hold  Return in 2 weeks  Also Hyperglycemia w/ steroids - no glucose montioring w/ steroids and insulin Refer DM to PCP , orders for gluometer checks and refills for syringes

## 2013-03-12 NOTE — Assessment & Plan Note (Signed)
Cont w/ HD per renal  bmet today  Restart steroids (wegeners w/ glomerulonephritis )  Will taper slowly and hold at 40mg  daily  Ov 2 weeks with Dr. Lamonte Sakai

## 2013-03-15 NOTE — Progress Notes (Signed)
Quick Note:  Called spoke with caregiver Nathaneil Canary and advised of lab results / recs as stated by TP. Nathaneil Canary verbalized his understanding and denied any questions - pt to keep his 6.4.14 appt w/ RB. ______

## 2013-03-17 ENCOUNTER — Telehealth: Payer: Self-pay | Admitting: Adult Health

## 2013-03-17 NOTE — Telephone Encounter (Signed)
ATC pt's caregiver to discuss, NA and no option to LM >> "The person you are trying to reach does not have a voice mail that is set up yet.  Goodbye."  Per 5.16.14 ov w/ TP: Patient Instructions    Restart Prednisone 60mg  daily for 1 week, Decrease prednisone 50mg  daily for 1 week then hold at prednisone 40 mg daily until seen back in office.  Needs follow up with ID clinic , Dr. Linus Salmons for follow up for TB in next 1-2 weeks.  Needs ASAP appointment with PCP -Dr. Vista Lawman for Diabetes follow up  Will send order for syringes and glucometer today but will need to be followed by family doctor for Diabetes.  Check blood sugars Twice daily , call primary doctor for blood sugars >200 or <80 -  follow up in 2 weeks with Dr. Lamonte Sakai As planned -please do not miss this follow up  Please contact office for sooner follow up if symptoms do not improve or worsen or seek emergency care  Call if develop severe weakness, dizziness or urinary frequency or excessive thirst.    Had called on 5.16.14 pt's pharmacy [facility] and spoken with Jeani Hawking.  She stated that instead of getting the order verbally, to fill out form that was faxed to office.  The form was filled out and signed by TP, then faxed back on same day and receipt was verified by Jeani Hawking.  A return fax was placed on my desk this morning informing me that I did not order the glucometer.  This is my fault.  [employer] and spoke with Abigail Butts, informed her that the glucometer and lancets/strips/alcohol swabs have been ordered and faxed back as we speak.  Will hold both faxes for scanning.

## 2013-03-18 NOTE — Telephone Encounter (Signed)
I spoke with Gerald Stabs and he states they have received everything. [place] Bing, CMA

## 2013-03-30 ENCOUNTER — Ambulatory Visit (INDEPENDENT_AMBULATORY_CARE_PROVIDER_SITE_OTHER): Payer: Medicaid Other | Admitting: Internal Medicine

## 2013-03-30 ENCOUNTER — Encounter: Payer: Self-pay | Admitting: Internal Medicine

## 2013-03-30 VITALS — BP 147/82 | HR 69 | Temp 97.5°F | Ht 67.0 in | Wt 147.0 lb

## 2013-03-30 DIAGNOSIS — R7611 Nonspecific reaction to tuberculin skin test without active tuberculosis: Secondary | ICD-10-CM

## 2013-03-30 NOTE — Assessment & Plan Note (Signed)
Now has completed therapy for latent tuberculosis. He can followup p.r.n.

## 2013-03-30 NOTE — Progress Notes (Signed)
  Subjective:    Patient ID: James Nicholson, male    DOB: 1966/10/28, 47 y.o.   MRN: [id number]  HPI  James Nicholson comes in here for followup after starting [facility] for positive PPD. James Nicholson was also placed on vitamin B6. James Nicholson continues to live in a nursing home and is accompanied by someone from the facility. James Nicholson started over 9 months ago and is tolerating the medicine well. No abnormal pain, minimal episodes of nausea but no weight loss and no difficulty eating. James Nicholson has no specific complaints today.  Started dialysis recently.   Review of Systems  Constitutional: Negative for fever and fatigue.  Respiratory: Negative for cough and shortness of breath.   Gastrointestinal: Negative for nausea, abdominal pain and diarrhea.       Objective:   Physical Exam  Constitutional: James Nicholson appears well-developed and well-nourished. No distress.  Skin: No rash noted.  Dialysis cath in place          Assessment & Plan:

## 2013-03-31 ENCOUNTER — Ambulatory Visit (INDEPENDENT_AMBULATORY_CARE_PROVIDER_SITE_OTHER): Payer: Medicaid Other | Admitting: *Deleted

## 2013-03-31 ENCOUNTER — Encounter: Payer: Self-pay | Admitting: Emergency Medicine

## 2013-03-31 ENCOUNTER — Ambulatory Visit (INDEPENDENT_AMBULATORY_CARE_PROVIDER_SITE_OTHER): Payer: Medicaid Other | Admitting: Emergency Medicine

## 2013-03-31 VITALS — BP 130/80 | HR 82 | Temp 98.4°F | Ht 68.0 in | Wt 152.8 lb

## 2013-03-31 DIAGNOSIS — M3130 Wegener's granulomatosis without renal involvement: Secondary | ICD-10-CM

## 2013-03-31 DIAGNOSIS — I428 Other cardiomyopathies: Secondary | ICD-10-CM

## 2013-03-31 DIAGNOSIS — Z95810 Presence of automatic (implantable) cardiac defibrillator: Secondary | ICD-10-CM

## 2013-03-31 DIAGNOSIS — I4901 Ventricular fibrillation: Secondary | ICD-10-CM

## 2013-03-31 DIAGNOSIS — N179 Acute kidney failure, unspecified: Secondary | ICD-10-CM

## 2013-03-31 DIAGNOSIS — R7611 Nonspecific reaction to tuberculin skin test without active tuberculosis: Secondary | ICD-10-CM

## 2013-03-31 LAB — ICD DEVICE OBSERVATION
BATTERY VOLTAGE: 3.0852 V
BRDY-0002RV: 40 {beats}/min
CHARGE TIME: 9.068 s
DEV-0020ICD: NEGATIVE
FVT: 0
PACEART VT: 0
RV LEAD AMPLITUDE: 5.375 mv
RV LEAD IMPEDENCE ICD: 646 Ohm
RV LEAD THRESHOLD: 0.75 V
TOT-0001: 9
TOT-0002: 0
TOT-0006: 20100415000000
TZAT-0001FASTVT: 1
TZAT-0001FASTVT: 2
TZAT-0001SLOWVT: 1
TZAT-0001SLOWVT: 2
TZAT-0002SLOWVT: NEGATIVE
TZAT-0002SLOWVT: NEGATIVE
TZAT-0004FASTVT: 10
TZAT-0004FASTVT: 10
TZAT-0005FASTVT: 88 pct
TZAT-0005FASTVT: 91 pct
TZAT-0011FASTVT: 10 ms
TZAT-0011FASTVT: 10 ms
TZAT-0012FASTVT: 200 ms
TZAT-0012FASTVT: 200 ms
TZAT-0012SLOWVT: 200 ms
TZAT-0012SLOWVT: 200 ms
TZAT-0013FASTVT: 1
TZAT-0013FASTVT: 2
TZAT-0018FASTVT: NEGATIVE
TZAT-0018FASTVT: NEGATIVE
TZAT-0018SLOWVT: NEGATIVE
TZAT-0018SLOWVT: NEGATIVE
TZAT-0019FASTVT: 8 V
TZAT-0019FASTVT: 8 V
TZAT-0019SLOWVT: 8 V
TZAT-0019SLOWVT: 8 V
TZAT-0020FASTVT: 1.5 ms
TZAT-0020FASTVT: 1.5 ms
TZAT-0020SLOWVT: 1.5 ms
TZAT-0020SLOWVT: 1.5 ms
TZON-0003FASTVT: 270 ms
TZON-0003SLOWVT: 390 ms
TZON-0003VSLOWVT: 450 ms
TZON-0004SLOWVT: 32
TZON-0004VSLOWVT: 20
TZON-0005SLOWVT: 12
TZST-0001FASTVT: 3
TZST-0001FASTVT: 4
TZST-0001FASTVT: 5
TZST-0001FASTVT: 6
TZST-0001SLOWVT: 3
TZST-0001SLOWVT: 4
TZST-0001SLOWVT: 5
TZST-0001SLOWVT: 6
TZST-0002SLOWVT: NEGATIVE
TZST-0002SLOWVT: NEGATIVE
TZST-0002SLOWVT: NEGATIVE
TZST-0002SLOWVT: NEGATIVE
TZST-0003FASTVT: 20 J
TZST-0003FASTVT: 35 J
TZST-0003FASTVT: 35 J
TZST-0003FASTVT: 35 J
VENTRICULAR PACING ICD: 0 pct
VF: 0

## 2013-03-31 MED ORDER — PREDNISONE 20 MG PO TABS: 40.0000 mg | ORAL_TABLET | Freq: Every day | ORAL | Status: DC

## 2013-03-31 NOTE — Assessment & Plan Note (Signed)
On HD 

## 2013-03-31 NOTE — Progress Notes (Signed)
ICD check with ICM in office.

## 2013-03-31 NOTE — Patient Instructions (Addendum)
Please continue your prednisone as you are taking it, 40mg  daily. We will refill this.  We will perform a Ct scan of your chest Follow with Dr Lamonte Sakai next available appointment after your CT scan

## 2013-03-31 NOTE — Addendum Note (Signed)
Addended by: Thedore Mins D on: 03/31/2013 04:18 PM   Modules accepted: Orders

## 2013-03-31 NOTE — Assessment & Plan Note (Signed)
-   continue prednisone 40mg  daily for now - repeat Ct scan chest now to compare w prior, suspect it will be improved - will need to discuss case with [place] [facility] to see if he needs to be on chronic/longer cytoxan vs duration prednisone.  - rov after the CT scan is done

## 2013-03-31 NOTE — Progress Notes (Signed)
Subjective:    Patient ID: James Nicholson, male    DOB: 09-Aug-1966, 47 y.o.   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  Followed by ID clinic for latent TB on active INH therapy (f/by Dr. Real Cons on [facility] 05/22/12 x 9 months)  Hx of V-Fib arrest , CHF s/p ICD implant (f/by Dr. Lovena Le -[facility]) Hx of Schizophrenia -group home pt   03/12/2013 [facility] follow up  [place] and english. Understands english well.  Pt returns for a post hospital follow up  Admitted for wegners granulamatosis w/ epistaxis, alveolar hemorrhage and renal failure.4/10-4/17/14 .  Started on HD on 02/05/13. Started on steroids burst. Discharged on prednisone 60mg  daily  Pulmonary consulted during admission for hemoptysis/bilateral pulmonary infiltrates.  Since discharge he is feeling much better w/ decreased/resolved cough. No hemoptysis.  CT chest showed Diffuse opacification involving both lungs, with peripheral sparing  recs to repeat CT in 2 months  CXR on 03/08/13 with improvement , NAD .  Prendisone was stopped abruptly on 5/8 , no refills.  He is a resident at [facility], meds are administered by [facility] employee . Says they get meds thru pharmacy and have to have refills to cont rx.  Seen by renal with HD on Tues/Thurs/Sat .  Maturing AV graft on right arm.  Has not seen PCP since discharge , no blood sugars are being checked.  On Levemir 5 units At bedtime .  No hemoptysis, chest pain, orthopnea, n/v, hematuria, cough, fever, night sweats.   ROV 03/31/13 -- returns for f/u of Wegener's with pulmonary and renal manifestations. He is on prednisone 40mg   qd, no cytoxan. He gets HD T, Th, Sat. He has weakness, denies cough, dyspnea, hemoptysis. He is followed by [facility], not sure which MD. Also sees ID for latent TB (on rx).   [id number]   ; Group Home #  Review of Systems  Constitutional: Negative for fever and unexpected weight change.  HENT: Negative for ear pain, nosebleeds, congestion, sore throat, rhinorrhea, sneezing, trouble swallowing, dental problem, postnasal drip and sinus pressure.   Eyes: Negative for redness and itching.  Respiratory: Negative for cough, chest tightness, shortness of breath and wheezing.   Cardiovascular: Negative for palpitations and leg swelling.  Gastrointestinal: Positive for abdominal pain. Negative for nausea and vomiting.  Genitourinary: Negative for dysuria.  Musculoskeletal: Negative for joint swelling.  Skin: Negative for rash.  Neurological: Positive for dizziness, weakness and headaches.  Hematological: Does not bruise/bleed easily.  Psychiatric/Behavioral: Negative for dysphoric mood. The patient is not nervous/anxious.    Past Medical History  Diagnosis Date  . Hypertension   . Dyslipidemia   . Anemia   . Cellulitis   . Atrial fibrillation   . Enterobacter sepsis   . Schizophrenia      Family History  Problem Relation Age of Onset  . CAD Mother      History   Social History  . Marital Status: Single    Spouse Name: N/A    Number of Children: N/A  . Years of Education: N/A   Occupational History  . Not on file.   Social History Main Topics  . Smoking  status: Former Smoker    Quit date: 10/28/2008  . Smokeless tobacco: Never Used  . Alcohol Use: No  . Drug Use: No  . Sexually Active: Not on file   Other Topics Concern  . Not on file   Social History Narrative  . No narrative on file     No Known Allergies   Outpatient Prescriptions Prior to Visit  Medication Sig Dispense Refill  . calcium acetate (PHOSLO) 667 MG capsule Take 1 capsule (667 mg total) by  mouth 3 (three) times daily with meals.  90 capsule  0  . insulin detemir (LEVEMIR) 100 UNIT/ML injection Inject 0.05 mLs (5 Units total) into the skin at bedtime.  10 mL  0  . isoniazid (NYDRAZID) 300 MG tablet Take 300 mg by mouth daily.      . Multiple Vitamin (MULTI VITAMIN MENS PO) Take 1 tablet by mouth once.      Marland Kitchen omeprazole (PRILOSEC) 20 MG capsule Take 20 mg by mouth daily.      . predniSONE (DELTASONE) 20 MG tablet 60mg  daily for 1 week , then 50mg  daily x 1 week then  40mg  daily -hold at this dose  90 tablet  1  . pyridOXINE (B-6) 50 MG tablet Take 1 tablet (50 mg total) by mouth daily.  30 tablet  8  . risperiDONE (RISPERDAL) 0.5 MG tablet Take 0.25 mg by mouth daily.       Marland Kitchen gabapentin (NEURONTIN) 300 MG capsule Take 300 mg by mouth 2 (two) times daily.      Marland Kitchen guaiFENesin-dextromethorphan (ROBITUSSIN DM) 100-10 MG/5ML syrup Take 5 mLs by mouth every 4 (four) hours as needed for cough.  118 mL  0  . naproxen (NAPROSYN) 500 MG tablet Take 500 mg by mouth 2 (two) times daily with a meal.      . sulfamethoxazole-trimethoprim (BACTRIM,SEPTRA) 400-80 MG per tablet Take 1 tablet by mouth every Monday, Wednesday, and Friday at 6 PM.  60 tablet  0  . traMADol (ULTRAM) 50 MG tablet Take 50 mg by mouth every 6 (six) hours as needed for pain.       No facility-administered medications prior to visit.         Objective:   Physical Exam Filed Vitals:   03/31/13 1525  BP: 130/80  Pulse: 82  Temp: 98.4 F (36.9 C)  TempSrc: Oral  Height: 5\' 8"  (1.727 m)  Weight: 152 lb 12.8 oz (69.31 kg)  SpO2: 98%   Gen: Pleasant, well-nourished, in no distress,  normal affect but unable to understand our conversation, communication via brother translating  ENT: No lesions,  mouth clear,  oropharynx clear, no postnasal drip  Neck: No JVD, no TMG, no carotid bruits  Lungs: No use of accessory muscles, clear without rales or rhonchi  Cardiovascular: RRR, heart sounds normal, no murmur or  gallops, no peripheral edema  Musculoskeletal: No deformities, no cyanosis or clubbing  Neuro: alert, non focal  Skin: Warm, no lesions or rashes      Assessment & Plan:  Wegener's disease, pulmonary - continue prednisone 40mg  daily for now - repeat Ct scan chest now to compare w prior, suspect it will be improved - will need to discuss case with [place] [facility] to see if he needs to be on chronic/longer cytoxan vs duration prednisone.  - rov after the CT scan is done  PPD positive Being rx for latent TB  ARF (acute renal failure) On HD  Automatic implantable cardioverter-defibrillator in situ  Followed by Dr Lovena Le

## 2013-03-31 NOTE — Assessment & Plan Note (Signed)
Followed by Dr. Taylor 

## 2013-03-31 NOTE — Assessment & Plan Note (Signed)
Being rx for latent TB

## 2013-04-01 ENCOUNTER — Telehealth: Payer: Self-pay | Admitting: Emergency Medicine

## 2013-04-01 NOTE — Telephone Encounter (Signed)
Dr. Lamonte Sakai-- FYI  Patient is usually seen by Dr. Jimmy Footman @ [facility] Patient dialyzes at [facility] [address]

## 2013-04-06 ENCOUNTER — Other Ambulatory Visit: Payer: Medicaid Other

## 2013-04-08 NOTE — Telephone Encounter (Signed)
I reviewed the case with Dr Lorrene Reid who remembers the patient from his hospitalization. We agreed that there is not a renal indication to continue cytoxan. We will use his pulmonary status to dictate whether we should escalate to cytoxan or whether pred 40 alone is sufficient.

## 2013-04-12 ENCOUNTER — Encounter: Payer: Self-pay | Admitting: Vascular Surgery

## 2013-04-12 ENCOUNTER — Ambulatory Visit (INDEPENDENT_AMBULATORY_CARE_PROVIDER_SITE_OTHER)
Admission: RE | Admit: 2013-04-12 | Discharge: 2013-04-12 | Disposition: A | Discharge status: Home / Self Care | Payer: Medicaid Other | Source: Ambulatory Visit | Attending: Emergency Medicine | Admitting: Emergency Medicine

## 2013-04-12 DIAGNOSIS — M3130 Wegener's granulomatosis without renal involvement: Secondary | ICD-10-CM

## 2013-04-13 ENCOUNTER — Ambulatory Visit (INDEPENDENT_AMBULATORY_CARE_PROVIDER_SITE_OTHER): Payer: Medicaid Other | Admitting: Vascular Surgery

## 2013-04-13 ENCOUNTER — Encounter: Payer: Self-pay | Admitting: Vascular Surgery

## 2013-04-13 VITALS — BP 134/81 | HR 76 | Ht 68.0 in | Wt 151.7 lb

## 2013-04-13 DIAGNOSIS — N186 End stage renal disease: Secondary | ICD-10-CM | POA: Insufficient documentation

## 2013-04-13 DIAGNOSIS — Z992 Dependence on renal dialysis: Secondary | ICD-10-CM | POA: Insufficient documentation

## 2013-04-13 NOTE — Progress Notes (Signed)
Patient is a for followup of right Cimino AV fistula creation on 02/10/2013. I inserted today with a caregiver from his group. He has excellent healing and excellent Leonard Hendler maturation of this fistula. I suspect that he will have very good long-term use of this. He is currently dialyzed via a catheter which is working without difficulty. He should be okay for access of his fistula at 3 months which would be the middle of July. He'll see [place] on an as-needed basis

## 2013-04-27 ENCOUNTER — Telehealth: Payer: Self-pay | Admitting: Emergency Medicine

## 2013-04-27 NOTE — Telephone Encounter (Signed)
Spoke with patients brother, reminded him of patients appt and that he requested to attend follow up with patient to interpret. Patients brother aware of time and location of our office. Nothing further needed at this time.

## 2013-04-28 ENCOUNTER — Ambulatory Visit (INDEPENDENT_AMBULATORY_CARE_PROVIDER_SITE_OTHER): Payer: Medicaid Other | Admitting: Emergency Medicine

## 2013-04-28 ENCOUNTER — Encounter: Payer: Self-pay | Admitting: Emergency Medicine

## 2013-04-28 VITALS — BP 148/80 | HR 89 | Temp 98.3°F | Ht 68.0 in | Wt 154.0 lb

## 2013-04-28 DIAGNOSIS — M3130 Wegener's granulomatosis without renal involvement: Secondary | ICD-10-CM

## 2013-04-28 NOTE — Assessment & Plan Note (Signed)
Large scale resolution of infiltrates on his CT scan 04/12/13. Breathing better and no cough. Discussed the case with Dr Lorrene Reid - there is no renal indication to re-dose with cytoxan since he is now HD dependent. The decision should be dictated by pulm status. Since he is improved, will defer cytoxan, plan a long taper to 5mg  then decrease 1mg  every month with goal to zero.  - decrease today to 30 - rov 1 month w a cxr

## 2013-04-28 NOTE — Patient Instructions (Addendum)
Please decrease your prednisone to 30mg  daily.  Follow with Dr Lamonte Sakai in 1 month with a CXR Call our office if you develop any worsening cough, shortness of breath, or especially if you cough up blood.

## 2013-04-28 NOTE — Progress Notes (Signed)
Subjective:    Patient ID: James Nicholson, male    DOB: [birth year], 47 y.o.   MRN: [id number]  HPI 47 yo male group home pt with known hx of Wegener's Dz , Renal failure H/o biopsy proven P-ANCA positive necrotizing glomerulonephritis treated with cytoxan and steroids in 2010 in [place] with creatinine of 1 post treatment  Seen for initial pulmonary consult during hospital admit on 4/10//2014 for wegners granulamatosis w/ epistaxis, alveolar hemorrhage and renal failure.  Followed by ID clinic for latent TB on active INH therapy (f/by Dr. Real Cons on [facility] 05/22/12 x 9 months)  Hx of V-Fib arrest , CHF s/p ICD implant (f/by Dr. Lovena Le -[facility]) Hx of Schizophrenia -group home pt   03/12/2013 [facility] follow up  [place] and english. Understands english well.  Pt returns for a post hospital follow up  Admitted for wegners granulamatosis w/ epistaxis, alveolar hemorrhage and renal failure.4/10-4/17/14 .  Started on HD on 02/05/13. Started on steroids burst. Discharged on prednisone 60mg  daily  Pulmonary consulted during admission for hemoptysis/bilateral pulmonary infiltrates.  Since discharge he is feeling much better w/ decreased/resolved cough. No hemoptysis.  CT chest showed Diffuse opacification involving both lungs, with peripheral sparing  recs to repeat CT in 2 months  CXR on 03/08/13 with improvement , NAD .  Prendisone was stopped abruptly on 5/8 , no refills.  He is a resident at [place], meds are administered by [facility] employee . Says they get meds thru pharmacy and have to have refills to cont rx.  Seen by renal with HD on Tues/Thurs/Sat .  Maturing AV graft on right arm.  Has not seen PCP since discharge , no blood sugars are being checked.  On Levemir 5 units At bedtime .  No hemoptysis, chest pain, orthopnea, n/v, hematuria, cough, fever, night sweats.   ROV 03/31/13 -- returns for f/u of Wegener's with pulmonary and renal manifestations. He is on prednisone 40mg   qd, no cytoxan. He gets HD T, Th, Sat. He has weakness, denies cough, dyspnea, hemoptysis. He is followed by [facility], not sure which MD. Also sees ID for latent TB (on rx).   ROV 04/28/13 -- hx of p-ANCA GN 2010, then with admission 01/2013 consistent with wegener's with pulmonary and renal manifestations. He developed ESRD on HD. Had pulmonary infiltrates and hemoptysis. Treated with IV cytoxan x 1, then pred 40mg  qd. Repeat CT scan 04/12/13 showed resolution of pulmonary infiltrates. Returns on pred 40mg  qd. He is breathing well but describes some dizziness after HD.    Review of Systems  Constitutional: Negative for fever and unexpected weight change.  HENT: Negative for ear pain, nosebleeds, congestion, sore throat, rhinorrhea, sneezing, trouble swallowing, dental problem, postnasal drip and sinus pressure.   Eyes: Negative for redness and itching.  Respiratory: Negative for cough, chest tightness, shortness of breath and wheezing.   Cardiovascular: Negative for palpitations and leg swelling.  Gastrointestinal: Negative for nausea, vomiting and abdominal pain.  Genitourinary: Negative for dysuria.  Musculoskeletal: Negative for joint swelling.  Skin: Negative for rash.  Neurological: Positive for dizziness and weakness. Negative for headaches.  Hematological: Does not bruise/bleed easily.  Psychiatric/Behavioral: Negative for dysphoric mood. The patient is not nervous/anxious.        Objective:   Physical Exam Filed Vitals:   04/28/13 1637  BP: 148/80  Pulse: 89  Temp: 98.3 F (36.8 C)  TempSrc: Oral  Height: 5\' 8"  (1.727 m)  Weight: 154 lb (69.854 kg)  SpO2: 97%  Gen: Pleasant, well-nourished, in no distress,  normal affect but unable to understand our conversation, communication via brother translating  ENT: No lesions,  mouth clear,  oropharynx clear, no postnasal drip  Neck: No JVD, no TMG, no carotid bruits  Lungs: No use of accessory muscles, clear without rales  or rhonchi  Cardiovascular: RRR, heart sounds normal, no murmur or gallops, no peripheral edema  Musculoskeletal: No deformities, no cyanosis or clubbing  Neuro: alert, non focal  Skin: Warm, no lesions or rashes   04/12/13 --  Comparison: 02/05/2013  Findings: No pleural effusion identified.  There is no airspace consolidation noted. Atelectasis versus scar  noted in the left lung base. Previously noted bilateral areas of  ground-glass attenuation have resolved.  The heart size is heart size is mildly enlarged. No pericardial  effusion. The patient has a left chest wall AICD with lead in the  right ventricle.  No enlarged mediastinal or hilar lymph nodes. There is no axillary  or supraclavicular adenopathy identified.  Limited imaging through the upper abdomen demonstrates no acute  findings.  Review of the visualized osseous structures is unremarkable.  IMPRESSION:  1. Interval response to therapy. Specifically, previously noted  ground-glass at the attenuation throughout both lungs has resolved  in the interval. There are areas of scar or plate-like atelectasis  within the left base.  2. No new findings.      Assessment & Plan:  Wegener's disease, pulmonary Large scale resolution of infiltrates on his CT scan 04/12/13. Breathing better and no cough. Discussed the case with Dr Lorrene Reid - there is no renal indication to re-dose with cytoxan since he is now HD dependent. The decision should be dictated by pulm status. Since he is improved, will defer cytoxan, plan a long taper to 5mg  then decrease 1mg  every month with goal to zero.  - decrease today to 30 - rov 1 month w a cxr

## 2013-05-17 ENCOUNTER — Encounter: Payer: Self-pay | Admitting: Internal Medicine

## 2013-06-16 ENCOUNTER — Ambulatory Visit: Payer: Medicaid Other | Admitting: Emergency Medicine

## 2013-06-24 ENCOUNTER — Ambulatory Visit: Payer: Medicaid Other | Admitting: Emergency Medicine

## 2013-06-25 ENCOUNTER — Ambulatory Visit (INDEPENDENT_AMBULATORY_CARE_PROVIDER_SITE_OTHER): Payer: Medicaid Other | Admitting: Emergency Medicine

## 2013-06-25 ENCOUNTER — Encounter: Payer: Self-pay | Admitting: Vascular Surgery

## 2013-06-25 ENCOUNTER — Other Ambulatory Visit: Payer: Self-pay

## 2013-06-25 ENCOUNTER — Encounter: Payer: Self-pay | Admitting: Emergency Medicine

## 2013-06-25 VITALS — BP 130/90 | HR 75 | Ht 68.0 in | Wt 148.0 lb

## 2013-06-25 DIAGNOSIS — N186 End stage renal disease: Secondary | ICD-10-CM

## 2013-06-25 DIAGNOSIS — M3130 Wegener's granulomatosis without renal involvement: Secondary | ICD-10-CM

## 2013-06-25 DIAGNOSIS — T82897A Other specified complication of cardiac prosthetic devices, implants and grafts, initial encounter: Secondary | ICD-10-CM

## 2013-06-25 MED ORDER — PREDNISONE 20 MG PO TABS: 30.0000 mg | ORAL_TABLET | Freq: Every day | ORAL | Status: DC

## 2013-06-25 NOTE — Progress Notes (Signed)
Subjective:    Patient ID: James Nicholson, male    DOB: [birth year], 47 y.o.   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  Followed by ID clinic for latent TB on active INH therapy (f/by Dr. Real Cons on [facility] 05/22/12 x 9 months)  Hx of V-Fib arrest , CHF s/p ICD implant (f/by Dr. Lovena Le -[facility]) Hx of Schizophrenia -group home pt   03/12/2013 [facility] follow up  [place] and english. Understands english well.  Pt returns for a post hospital follow up  Admitted for wegners granulamatosis w/ epistaxis, alveolar hemorrhage and renal failure.4/10-4/17/14 .  Started on HD on 02/05/13. Started on steroids burst. Discharged on prednisone 60mg  daily  Pulmonary consulted during admission for hemoptysis/bilateral pulmonary infiltrates.  Since discharge he is feeling much better w/ decreased/resolved cough. No hemoptysis.  CT chest showed Diffuse opacification involving both lungs, with peripheral sparing  recs to repeat CT in 2 months  CXR on 03/08/13 with improvement , NAD .  Prendisone was stopped abruptly on 5/8 , no refills.  He is a resident at [place], meds are administered by [facility] employee . Says they get meds thru pharmacy and have to have refills to cont rx.  Seen by renal with HD on Tues/Thurs/Sat .  Maturing AV graft on right arm.  Has not seen PCP since discharge , no blood sugars are being checked.  On Levemir 5 units At bedtime .  No hemoptysis, chest pain, orthopnea, n/v, hematuria, cough, fever, night sweats.   ROV 03/31/13 -- returns for f/u of Wegener's with pulmonary and renal manifestations. He is on prednisone 40mg   qd, no cytoxan. He gets HD T, Th, Sat. He has weakness, denies cough, dyspnea, hemoptysis. He is followed by [facility], not sure which MD. Also sees ID for latent TB (on rx).   ROV 04/28/13 -- hx of p-ANCA GN 2010, then with admission 01/2013 consistent with wegener's with pulmonary and renal manifestations. He developed ESRD on HD. Had pulmonary infiltrates and hemoptysis. Treated with IV cytoxan x 1, then pred 40mg  qd. Repeat CT scan 04/12/13 showed resolution of pulmonary infiltrates. Returns on pred 40mg  qd. He is breathing well but describes some dizziness after HD.   ROV 06/25/13 -- hx of p-ANCA GN 2010, then with admission 01/2013 consistent with wegener's with pulmonary and renal manifestations. He developed ESRD on HD. Infiltrates resolved on CT from 04/12/13. Current pred 30 with goal taper to zero slowly. Has been doing well, tolerates HD. No cough, CP, hemoptysis. No dyspnea. No new sx.    Review of Systems  Constitutional: Negative for fever and unexpected weight change.  HENT: Negative for ear pain, nosebleeds, congestion, sore throat, rhinorrhea, sneezing, trouble swallowing, dental problem, postnasal drip and sinus pressure.   Eyes: Negative for redness and itching.  Respiratory: Negative for cough, chest tightness, shortness of breath and wheezing.   Cardiovascular: Negative for palpitations and leg swelling.  Gastrointestinal: Negative for nausea, vomiting and abdominal pain.  Genitourinary: Negative for dysuria.  Musculoskeletal: Negative for joint swelling.  Skin: Negative for rash.  Neurological: Positive for dizziness and weakness. Negative for headaches.  Hematological: Does not bruise/bleed easily.  Psychiatric/Behavioral:  Negative for dysphoric mood. The patient is not nervous/anxious.        Objective:   Physical Exam Filed Vitals:   06/25/13 0957  BP: 130/90  Pulse: 75  Height: 5\' 8"  (1.727 m)  Weight: 148 lb (67.132 kg)  SpO2: 98%   Gen: Pleasant,  well-nourished, in no distress,  normal affect but unable to understand our conversation, communication via brother translating  ENT: No lesions,  mouth clear,  oropharynx clear, no postnasal drip  Neck: No JVD, no TMG, no carotid bruits  Lungs: No use of accessory muscles, clear without rales or rhonchi  Cardiovascular: RRR, heart sounds normal, no murmur or gallops, no peripheral edema  Musculoskeletal: No deformities, no cyanosis or clubbing  Neuro: alert, non focal  Skin: Warm, no lesions or rashes   04/12/13 --  Comparison: 02/05/2013  Findings: No pleural effusion identified.  There is no airspace consolidation noted. Atelectasis versus scar  noted in the left lung base. Previously noted bilateral areas of  ground-glass attenuation have resolved.  The heart size is heart size is mildly enlarged. No pericardial  effusion. The patient has a left chest wall AICD with lead in the  right ventricle.  No enlarged mediastinal or hilar lymph nodes. There is no axillary  or supraclavicular adenopathy identified.  Limited imaging through the upper abdomen demonstrates no acute  findings.  Review of the visualized osseous structures is unremarkable.  IMPRESSION:  1. Interval response to therapy. Specifically, previously noted  ground-glass at the attenuation throughout both lungs has resolved  in the interval. There are areas of scar or plate-like atelectasis  within the left base.  2. No new findings.      Assessment & Plan:  No problem-specific assessment & plan notes found for this encounter.

## 2013-06-25 NOTE — Addendum Note (Signed)
Addended by: Thedore Mins D on: 06/25/2013 04:49 PM   Modules accepted: Orders

## 2013-06-25 NOTE — Patient Instructions (Addendum)
Please decrease your Prednisone to 20mg  daily until our next visit Follow with Dr Lamonte Sakai in 1 month  CXR today. We will call you with the results.

## 2013-06-25 NOTE — Assessment & Plan Note (Signed)
-   we will decrease pred to 20mg  daily, continue a slow taper.  - CXR today - rov in 1 month to discuss whether to continue to wean.

## 2013-06-29 DIAGNOSIS — E119 Type 2 diabetes mellitus without complications: Secondary | ICD-10-CM | POA: Diagnosis not present

## 2013-06-29 DIAGNOSIS — D631 Anemia in chronic kidney disease: Secondary | ICD-10-CM | POA: Diagnosis not present

## 2013-06-29 DIAGNOSIS — D509 Iron deficiency anemia, unspecified: Secondary | ICD-10-CM | POA: Diagnosis not present

## 2013-06-29 DIAGNOSIS — N186 End stage renal disease: Secondary | ICD-10-CM | POA: Diagnosis not present

## 2013-06-30 ENCOUNTER — Other Ambulatory Visit: Payer: Self-pay | Admitting: *Deleted

## 2013-06-30 DIAGNOSIS — T82598A Other mechanical complication of other cardiac and vascular devices and implants, initial encounter: Secondary | ICD-10-CM

## 2013-07-01 DIAGNOSIS — D509 Iron deficiency anemia, unspecified: Secondary | ICD-10-CM | POA: Diagnosis not present

## 2013-07-01 DIAGNOSIS — N186 End stage renal disease: Secondary | ICD-10-CM | POA: Diagnosis not present

## 2013-07-01 DIAGNOSIS — E119 Type 2 diabetes mellitus without complications: Secondary | ICD-10-CM | POA: Diagnosis not present

## 2013-07-01 DIAGNOSIS — D631 Anemia in chronic kidney disease: Secondary | ICD-10-CM | POA: Diagnosis not present

## 2013-07-03 DIAGNOSIS — N186 End stage renal disease: Secondary | ICD-10-CM | POA: Diagnosis not present

## 2013-07-03 DIAGNOSIS — D631 Anemia in chronic kidney disease: Secondary | ICD-10-CM | POA: Diagnosis not present

## 2013-07-03 DIAGNOSIS — D509 Iron deficiency anemia, unspecified: Secondary | ICD-10-CM | POA: Diagnosis not present

## 2013-07-03 DIAGNOSIS — E119 Type 2 diabetes mellitus without complications: Secondary | ICD-10-CM | POA: Diagnosis not present

## 2013-07-06 ENCOUNTER — Encounter: Payer: Medicaid Other | Admitting: Internal Medicine

## 2013-07-06 DIAGNOSIS — N186 End stage renal disease: Secondary | ICD-10-CM | POA: Diagnosis not present

## 2013-07-06 DIAGNOSIS — D509 Iron deficiency anemia, unspecified: Secondary | ICD-10-CM | POA: Diagnosis not present

## 2013-07-06 DIAGNOSIS — E119 Type 2 diabetes mellitus without complications: Secondary | ICD-10-CM | POA: Diagnosis not present

## 2013-07-06 DIAGNOSIS — D631 Anemia in chronic kidney disease: Secondary | ICD-10-CM | POA: Diagnosis not present

## 2013-07-08 DIAGNOSIS — N186 End stage renal disease: Secondary | ICD-10-CM | POA: Diagnosis not present

## 2013-07-08 DIAGNOSIS — E119 Type 2 diabetes mellitus without complications: Secondary | ICD-10-CM | POA: Diagnosis not present

## 2013-07-08 DIAGNOSIS — D631 Anemia in chronic kidney disease: Secondary | ICD-10-CM | POA: Diagnosis not present

## 2013-07-08 DIAGNOSIS — D509 Iron deficiency anemia, unspecified: Secondary | ICD-10-CM | POA: Diagnosis not present

## 2013-07-09 ENCOUNTER — Ambulatory Visit: Payer: Medicaid Other | Admitting: Vascular Surgery

## 2013-07-09 DIAGNOSIS — F209 Schizophrenia, unspecified: Secondary | ICD-10-CM | POA: Diagnosis not present

## 2013-07-09 DIAGNOSIS — N186 End stage renal disease: Secondary | ICD-10-CM | POA: Diagnosis not present

## 2013-07-09 DIAGNOSIS — G63 Polyneuropathy in diseases classified elsewhere: Secondary | ICD-10-CM | POA: Diagnosis not present

## 2013-07-09 DIAGNOSIS — E785 Hyperlipidemia, unspecified: Secondary | ICD-10-CM | POA: Diagnosis not present

## 2013-07-09 DIAGNOSIS — E119 Type 2 diabetes mellitus without complications: Secondary | ICD-10-CM | POA: Diagnosis not present

## 2013-07-09 DIAGNOSIS — T7840XA Allergy, unspecified, initial encounter: Secondary | ICD-10-CM | POA: Diagnosis not present

## 2013-07-09 DIAGNOSIS — K219 Gastro-esophageal reflux disease without esophagitis: Secondary | ICD-10-CM | POA: Diagnosis not present

## 2013-07-10 DIAGNOSIS — D631 Anemia in chronic kidney disease: Secondary | ICD-10-CM | POA: Diagnosis not present

## 2013-07-10 DIAGNOSIS — N186 End stage renal disease: Secondary | ICD-10-CM | POA: Diagnosis not present

## 2013-07-10 DIAGNOSIS — D509 Iron deficiency anemia, unspecified: Secondary | ICD-10-CM | POA: Diagnosis not present

## 2013-07-10 DIAGNOSIS — E119 Type 2 diabetes mellitus without complications: Secondary | ICD-10-CM | POA: Diagnosis not present

## 2013-07-13 DIAGNOSIS — E119 Type 2 diabetes mellitus without complications: Secondary | ICD-10-CM | POA: Diagnosis not present

## 2013-07-13 DIAGNOSIS — N186 End stage renal disease: Secondary | ICD-10-CM | POA: Diagnosis not present

## 2013-07-13 DIAGNOSIS — D631 Anemia in chronic kidney disease: Secondary | ICD-10-CM | POA: Diagnosis not present

## 2013-07-13 DIAGNOSIS — D509 Iron deficiency anemia, unspecified: Secondary | ICD-10-CM | POA: Diagnosis not present

## 2013-07-15 DIAGNOSIS — D631 Anemia in chronic kidney disease: Secondary | ICD-10-CM | POA: Diagnosis not present

## 2013-07-15 DIAGNOSIS — D509 Iron deficiency anemia, unspecified: Secondary | ICD-10-CM | POA: Diagnosis not present

## 2013-07-15 DIAGNOSIS — E119 Type 2 diabetes mellitus without complications: Secondary | ICD-10-CM | POA: Diagnosis not present

## 2013-07-15 DIAGNOSIS — N186 End stage renal disease: Secondary | ICD-10-CM | POA: Diagnosis not present

## 2013-07-17 DIAGNOSIS — D509 Iron deficiency anemia, unspecified: Secondary | ICD-10-CM | POA: Diagnosis not present

## 2013-07-17 DIAGNOSIS — E119 Type 2 diabetes mellitus without complications: Secondary | ICD-10-CM | POA: Diagnosis not present

## 2013-07-17 DIAGNOSIS — D631 Anemia in chronic kidney disease: Secondary | ICD-10-CM | POA: Diagnosis not present

## 2013-07-17 DIAGNOSIS — N186 End stage renal disease: Secondary | ICD-10-CM | POA: Diagnosis not present

## 2013-07-19 ENCOUNTER — Encounter: Payer: Medicaid Other | Admitting: Internal Medicine

## 2013-07-20 DIAGNOSIS — D509 Iron deficiency anemia, unspecified: Secondary | ICD-10-CM | POA: Diagnosis not present

## 2013-07-20 DIAGNOSIS — E119 Type 2 diabetes mellitus without complications: Secondary | ICD-10-CM | POA: Diagnosis not present

## 2013-07-20 DIAGNOSIS — N186 End stage renal disease: Secondary | ICD-10-CM | POA: Diagnosis not present

## 2013-07-20 DIAGNOSIS — D631 Anemia in chronic kidney disease: Secondary | ICD-10-CM | POA: Diagnosis not present

## 2013-07-22 ENCOUNTER — Ambulatory Visit (INDEPENDENT_AMBULATORY_CARE_PROVIDER_SITE_OTHER)
Admission: RE | Admit: 2013-07-22 | Discharge: 2013-07-22 | Disposition: A | Discharge status: Home / Self Care | Payer: Medicare Other | Source: Ambulatory Visit | Attending: Emergency Medicine | Admitting: Emergency Medicine

## 2013-07-22 ENCOUNTER — Other Ambulatory Visit: Payer: Self-pay | Admitting: Emergency Medicine

## 2013-07-22 DIAGNOSIS — N186 End stage renal disease: Secondary | ICD-10-CM | POA: Diagnosis not present

## 2013-07-22 DIAGNOSIS — I10 Essential (primary) hypertension: Secondary | ICD-10-CM | POA: Diagnosis not present

## 2013-07-22 DIAGNOSIS — M3130 Wegener's granulomatosis without renal involvement: Secondary | ICD-10-CM | POA: Diagnosis not present

## 2013-07-22 DIAGNOSIS — E119 Type 2 diabetes mellitus without complications: Secondary | ICD-10-CM | POA: Diagnosis not present

## 2013-07-22 DIAGNOSIS — D631 Anemia in chronic kidney disease: Secondary | ICD-10-CM | POA: Diagnosis not present

## 2013-07-22 DIAGNOSIS — D509 Iron deficiency anemia, unspecified: Secondary | ICD-10-CM | POA: Diagnosis not present

## 2013-07-23 ENCOUNTER — Ambulatory Visit (INDEPENDENT_AMBULATORY_CARE_PROVIDER_SITE_OTHER): Payer: Medicare Other | Admitting: Emergency Medicine

## 2013-07-23 ENCOUNTER — Encounter: Payer: Self-pay | Admitting: Emergency Medicine

## 2013-07-23 VITALS — BP 128/90 | HR 82 | Temp 97.8°F | Ht 66.0 in | Wt 150.8 lb

## 2013-07-23 DIAGNOSIS — G63 Polyneuropathy in diseases classified elsewhere: Secondary | ICD-10-CM | POA: Diagnosis not present

## 2013-07-23 DIAGNOSIS — E785 Hyperlipidemia, unspecified: Secondary | ICD-10-CM | POA: Diagnosis not present

## 2013-07-23 DIAGNOSIS — M3130 Wegener's granulomatosis without renal involvement: Secondary | ICD-10-CM

## 2013-07-23 DIAGNOSIS — T7840XA Allergy, unspecified, initial encounter: Secondary | ICD-10-CM | POA: Diagnosis not present

## 2013-07-23 DIAGNOSIS — N186 End stage renal disease: Secondary | ICD-10-CM | POA: Diagnosis not present

## 2013-07-23 DIAGNOSIS — K219 Gastro-esophageal reflux disease without esophagitis: Secondary | ICD-10-CM | POA: Diagnosis not present

## 2013-07-23 DIAGNOSIS — F209 Schizophrenia, unspecified: Secondary | ICD-10-CM | POA: Diagnosis not present

## 2013-07-23 DIAGNOSIS — E119 Type 2 diabetes mellitus without complications: Secondary | ICD-10-CM | POA: Diagnosis not present

## 2013-07-23 MED ORDER — PREDNISONE 5 MG PO TABS: ORAL_TABLET | ORAL | Status: DC

## 2013-07-23 NOTE — Assessment & Plan Note (Signed)
He is doing well - will taper down to zero over 2 months. If he has sx before f/u then he will call us.

## 2013-07-23 NOTE — Progress Notes (Signed)
Subjective:    Patient ID: James Nicholson, male    DOB: [birth year], 47 y.o.   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  Followed by ID clinic for latent TB on active INH therapy (f/by Dr. Real Cons on [facility] 05/22/12 x 9 months)  Hx of V-Fib arrest , CHF s/p ICD implant (f/by Dr. Lovena Le -[facility]) Hx of Schizophrenia -group home pt   03/12/2013 [facility] follow up  [place] and english. Understands english well.  Pt returns for a post hospital follow up  Admitted for wegners granulamatosis w/ epistaxis, alveolar hemorrhage and renal failure.4/10-4/17/14 .  Started on HD on 02/05/13. Started on steroids burst. Discharged on prednisone 60mg  daily  Pulmonary consulted during admission for hemoptysis/bilateral pulmonary infiltrates.  Since discharge he is feeling much better w/ decreased/resolved cough. No hemoptysis.  CT chest showed Diffuse opacification involving both lungs, with peripheral sparing  recs to repeat CT in 2 months  CXR on 03/08/13 with improvement , NAD .  Prendisone was stopped abruptly on 5/8 , no refills.  He is a resident at [place], meds are administered by [facility] employee . Says they get meds thru pharmacy and have to have refills to cont rx.  Seen by renal with HD on Tues/Thurs/Sat .  Maturing AV graft on right arm.  Has not seen PCP since discharge , no blood sugars are being checked.  On Levemir 5 units At bedtime .  No hemoptysis, chest pain, orthopnea, n/v, hematuria, cough, fever, night sweats.   ROV 03/31/13 -- returns for f/u of Wegener's with pulmonary and renal manifestations. He is on prednisone 40mg   qd, no cytoxan. He gets HD T, Th, Sat. He has weakness, denies cough, dyspnea, hemoptysis. He is followed by [facility], not sure which MD. Also sees ID for latent TB (on rx).   ROV 04/28/13 -- hx of p-ANCA GN 2010, then with admission 01/2013 consistent with wegener's with pulmonary and renal manifestations. He developed ESRD on HD. Had pulmonary infiltrates and hemoptysis. Treated with IV cytoxan x 1, then pred 40mg  qd. Repeat CT scan 04/12/13 showed resolution of pulmonary infiltrates. Returns on pred 40mg  qd. He is breathing well but describes some dizziness after HD.   ROV 06/25/13 -- hx of p-ANCA GN 2010, then with admission 01/2013 consistent with wegener's with pulmonary and renal manifestations. He developed ESRD on HD. Infiltrates resolved on CT from 04/12/13. Current pred 30 with goal taper to zero slowly. Has been doing well, tolerates HD. No cough, CP, hemoptysis. No dyspnea. No new sx.   ROV 07/23/13 -- p-ANCA GN 2010, then with admission 01/2013 consistent with wegener's with pulmonary and renal manifestations. On HD. Last time we decreased Pred to 20mg   No cough, CP, hemoptysis. No dyspnea. No new sx.    Review of Systems  Constitutional: Negative for fever and unexpected weight change.  HENT: Negative for ear pain, nosebleeds, congestion, sore throat, rhinorrhea, sneezing, trouble swallowing, dental problem, postnasal drip and sinus pressure.   Eyes: Negative for redness and itching.  Respiratory: Negative for cough, chest tightness, shortness of breath and wheezing.   Cardiovascular: Negative for palpitations and leg swelling.  Gastrointestinal: Negative for  nausea, vomiting and abdominal pain.  Genitourinary: Negative for dysuria.  Musculoskeletal: Negative for joint swelling.  Skin: Negative for rash.  Neurological: Positive for dizziness and weakness. Negative for headaches.  Hematological: Does not bruise/bleed easily.  Psychiatric/Behavioral: Negative for dysphoric mood. The  patient is not nervous/anxious.        Objective:   Physical Exam Filed Vitals:   07/23/13 1524  BP: 128/90  Pulse: 82  Temp: 97.8 F (36.6 C)  TempSrc: Oral  Height: 5\' 6"  (1.676 m)  Weight: 150 lb 12.8 oz (68.402 kg)  SpO2: 97%   Gen: Pleasant, well-nourished, in no distress,  normal affect but unable to understand our conversation, communication via brother translating  ENT: No lesions,  mouth clear,  oropharynx clear, no postnasal drip  Neck: No JVD, no TMG, no carotid bruits  Lungs: No use of accessory muscles, clear without rales or rhonchi  Cardiovascular: RRR, heart sounds normal, no murmur or gallops, no peripheral edema  Musculoskeletal: No deformities, no cyanosis or clubbing  Neuro: alert, non focal  Skin: Warm, no lesions or rashes   04/12/13 --  Comparison: 02/05/2013  Findings: No pleural effusion identified.  There is no airspace consolidation noted. Atelectasis versus scar  noted in the left lung base. Previously noted bilateral areas of  ground-glass attenuation have resolved.  The heart size is heart size is mildly enlarged. No pericardial  effusion. The patient has a left chest wall AICD with lead in the  right ventricle.  No enlarged mediastinal or hilar lymph nodes. There is no axillary  or supraclavicular adenopathy identified.  Limited imaging through the upper abdomen demonstrates no acute  findings.  Review of the visualized osseous structures is unremarkable.  IMPRESSION:  1. Interval response to therapy. Specifically, previously noted  ground-glass at the attenuation throughout both lungs has resolved  in the interval. There are areas of scar or plate-like atelectasis  within the left base.  2. No new findings.   CXR 07/22/13 --  COMPARISON: 03/08/2013  Correlation: Interval CT chest 04/12/2013  FINDINGS:  Right jugular central venous catheter with tip at cavoatrial  junction.  Left subclavian AICD lead projects at right  ventricle.  Enlargement of cardiac silhouette.  Mediastinal contours and pulmonary vascularity normal.  Lungs clear.  No pleural effusion or pneumothorax.  Bones unremarkable.  IMPRESSION:  Enlargement of cardiac silhouette post AICD.  No acute abnormalities.      Assessment & Plan:  Wegener's disease, pulmonary He is doing well - will taper down to zero over 2 months. If he has sx before f/u then he will call us.

## 2013-07-23 NOTE — Patient Instructions (Addendum)
Please decrease your prednisone to 10mg  for 1 month, then to 5mg  for 1 month if doing well.  Call our office if you have any new cough or breathing symptoms.  Follow with Dr Lamonte Sakai in 2 months or sooner if you have any problems.

## 2013-07-24 DIAGNOSIS — N186 End stage renal disease: Secondary | ICD-10-CM | POA: Diagnosis not present

## 2013-07-24 DIAGNOSIS — D509 Iron deficiency anemia, unspecified: Secondary | ICD-10-CM | POA: Diagnosis not present

## 2013-07-24 DIAGNOSIS — E119 Type 2 diabetes mellitus without complications: Secondary | ICD-10-CM | POA: Diagnosis not present

## 2013-07-24 DIAGNOSIS — D631 Anemia in chronic kidney disease: Secondary | ICD-10-CM | POA: Diagnosis not present

## 2013-07-27 DIAGNOSIS — E119 Type 2 diabetes mellitus without complications: Secondary | ICD-10-CM | POA: Diagnosis not present

## 2013-07-27 DIAGNOSIS — D631 Anemia in chronic kidney disease: Secondary | ICD-10-CM | POA: Diagnosis not present

## 2013-07-27 DIAGNOSIS — D509 Iron deficiency anemia, unspecified: Secondary | ICD-10-CM | POA: Diagnosis not present

## 2013-07-27 DIAGNOSIS — N186 End stage renal disease: Secondary | ICD-10-CM | POA: Diagnosis not present

## 2013-07-29 DIAGNOSIS — D509 Iron deficiency anemia, unspecified: Secondary | ICD-10-CM | POA: Diagnosis not present

## 2013-07-29 DIAGNOSIS — Z23 Encounter for immunization: Secondary | ICD-10-CM | POA: Diagnosis not present

## 2013-07-29 DIAGNOSIS — E119 Type 2 diabetes mellitus without complications: Secondary | ICD-10-CM | POA: Diagnosis not present

## 2013-07-29 DIAGNOSIS — N186 End stage renal disease: Secondary | ICD-10-CM | POA: Diagnosis not present

## 2013-07-29 DIAGNOSIS — D631 Anemia in chronic kidney disease: Secondary | ICD-10-CM | POA: Diagnosis not present

## 2013-08-03 ENCOUNTER — Encounter: Payer: Self-pay | Admitting: Vascular Surgery

## 2013-08-04 ENCOUNTER — Encounter (INDEPENDENT_AMBULATORY_CARE_PROVIDER_SITE_OTHER): Payer: Self-pay

## 2013-08-04 ENCOUNTER — Encounter: Payer: Self-pay | Admitting: Vascular Surgery

## 2013-08-04 ENCOUNTER — Other Ambulatory Visit: Payer: Self-pay

## 2013-08-04 ENCOUNTER — Ambulatory Visit (INDEPENDENT_AMBULATORY_CARE_PROVIDER_SITE_OTHER): Payer: Medicare Other | Admitting: Vascular Surgery

## 2013-08-04 ENCOUNTER — Ambulatory Visit (HOSPITAL_COMMUNITY)
Admission: RE | Admit: 2013-08-04 | Discharge: 2013-08-04 | Disposition: A | Discharge status: Home / Self Care | Payer: Medicare Other | Source: Ambulatory Visit | Attending: Vascular Surgery | Admitting: Vascular Surgery

## 2013-08-04 VITALS — BP 143/93 | HR 76 | Ht 66.0 in | Wt 150.0 lb

## 2013-08-04 DIAGNOSIS — T82598A Other mechanical complication of other cardiac and vascular devices and implants, initial encounter: Secondary | ICD-10-CM | POA: Diagnosis not present

## 2013-08-04 DIAGNOSIS — T82898A Other specified complication of vascular prosthetic devices, implants and grafts, initial encounter: Secondary | ICD-10-CM | POA: Diagnosis not present

## 2013-08-04 DIAGNOSIS — Y849 Medical procedure, unspecified as the cause of abnormal reaction of the patient, or of later complication, without mention of misadventure at the time of the procedure: Secondary | ICD-10-CM | POA: Insufficient documentation

## 2013-08-04 NOTE — Progress Notes (Signed)
[facility]  Patient name: James Nicholson MRN: [id number] DOB: 1966/02/23 Sex: male  REASON FOR VISIT: poorly maturing right forearm AV fistula.  HPI: James Nicholson is a 47 y.o. male who dialyzes on Tuesdays Thursdays and Saturdays. He had a right forearm AV fistula placed on 02/10/2013. He was seen in follow up on 04/13/2013. At that time it looks like his fistula was maturing nicely. He sent back today as his fistula has not been working adequately. He has a right IJ tunneled dialysis catheter. They use one medial and the fistula and uses catheter for the other port. He has no specific complaints referrable to his right arm. He denies any recent uremic symptoms. He denies nausea vomiting fatigue anorexia or palpitations.   Past Medical History  Diagnosis Date  . Hypertension   . Dyslipidemia   . Anemia   . Cellulitis   . Atrial fibrillation   . Enterobacter sepsis   . Schizophrenia   . Chronic kidney disease   . Hyperlipidemia    Family History  Problem Relation Age of Onset  . CAD Mother    SOCIAL HISTORY: History  Substance Use Topics  . Smoking status: Former Smoker    Quit date: 10/28/2008  . Smokeless tobacco: Never Used  . Alcohol Use: No   No Known Allergies Current Outpatient Prescriptions  Medication Sig Dispense Refill  . B Complex-C-Folic Acid (DIALYVITE PO) Take 1 tablet by mouth daily.      . calcium acetate (PHOSLO) 667 MG capsule Take 667 mg by mouth daily.      . insulin detemir (LEVEMIR) 100 UNIT/ML injection Inject 0.05 mLs (5 Units total) into the skin at bedtime.  10 mL  0  . omeprazole (PRILOSEC) 20 MG capsule Take 20 mg by mouth daily.      . predniSONE (DELTASONE) 20 MG tablet Take 1.5 tablets (30 mg total) by mouth daily.  30 tablet  1  . predniSONE (DELTASONE) 5 MG tablet As directed  30 tablet  1  . risperiDONE (RISPERDAL) 0.5 MG tablet Take 0.25 mg by mouth daily.        No current facility-administered  medications for this visit.   REVIEW OF SYSTEMS: Valu.Nieves ] denotes positive finding; [  ] denotes negative finding  CARDIOVASCULAR:  [ ]  chest pain   [ ]  chest pressure   [ ]  palpitations   [ ]  orthopnea   [ ]  dyspnea on exertion   [ ]  claudication   [ ]  rest pain   [ ]  DVT   [ ]  phlebitis PULMONARY:   [ ]  productive cough   [ ]  asthma   [ ]  wheezing NEUROLOGIC:   [ ]  weakness  [ ]  paresthesias  [ ]  aphasia  [ ]  amaurosis  [ ]  dizziness HEMATOLOGIC:   [ ]  bleeding problems   [ ]  clotting disorders MUSCULOSKELETAL:  [ ]  joint pain   [ ]  joint swelling [ ]  leg swelling GASTROINTESTINAL: [ ]   blood in stool  [ ]   hematemesis GENITOURINARY:  [ ]   dysuria  [ ]   hematuria PSYCHIATRIC:  [ ]  history of major depression INTEGUMENTARY:  [ ]  rashes  [ ]  ulcers CONSTITUTIONAL:  [ ]  fever   [ ]  chills  PHYSICAL EXAM: Filed Vitals:   08/04/13 1003  BP: 143/93  Pulse: 76  Height: 5\' 6"  (1.676 m)  Weight: 150 lb (68.04 kg)  SpO2: 100%   Body mass index is 24.22 kg/(m^2).  GENERAL: The patient is a well-nourished male, in no acute distress. The vital signs are documented above. CARDIOVASCULAR: There is a regular rate and rhythm.  PULMONARY: There is good air exchange bilaterally without wheezing or rales. NEUROLOGIC: No focal weakness or paresthesias are detected. SKIN: There are no ulcers or rashes noted. His right forearm AV fistula has an excellent bruit and thrill. It is not especially pulsatile. It seems to be working adequately.  DATA:  Shows that his upper arm cephalic vein is occluded but the forearm cephalic vein empties into the basilic system. Diameters of the fistula range from 0.3 1.52 cm in its are not adequate at this point. There are some small competing branches also some areas of increased velocity suggesting possible stenoses.  MEDICAL ISSUES: I recommended that we proceed with a fistulogram to further evaluate his fistula. We have discussed the procedure potential complications we'll  make further recommendations pending these results. If he has areas of stenosis amenable to venoplasty we will proceed at the same time. If the branches appear to be significant he may require ligation of the competing branches in the operating room.  [facility]: [phone]

## 2013-08-05 DIAGNOSIS — E1129 Type 2 diabetes mellitus with other diabetic kidney complication: Secondary | ICD-10-CM | POA: Diagnosis not present

## 2013-08-08 MED ORDER — SODIUM CHLORIDE 0.9 % IJ SOLN: 3.0000 mL | INTRAMUSCULAR | Status: DC | PRN

## 2013-08-09 ENCOUNTER — Encounter (HOSPITAL_COMMUNITY)
Admission: RE | Disposition: A | Discharge status: Home / Self Care | Payer: Self-pay | Source: Ambulatory Visit | Attending: Vascular Surgery

## 2013-08-09 ENCOUNTER — Encounter (HOSPITAL_COMMUNITY): Payer: Self-pay | Admitting: Pharmacy Technician

## 2013-08-09 ENCOUNTER — Ambulatory Visit (HOSPITAL_COMMUNITY)
Admission: RE | Admit: 2013-08-09 | Discharge: 2013-08-09 | Disposition: A | Discharge status: Home / Self Care | Payer: Medicare Other | Source: Ambulatory Visit | Attending: Vascular Surgery | Admitting: Vascular Surgery

## 2013-08-09 DIAGNOSIS — T82898A Other specified complication of vascular prosthetic devices, implants and grafts, initial encounter: Secondary | ICD-10-CM | POA: Insufficient documentation

## 2013-08-09 DIAGNOSIS — E785 Hyperlipidemia, unspecified: Secondary | ICD-10-CM | POA: Diagnosis not present

## 2013-08-09 DIAGNOSIS — I749 Embolism and thrombosis of unspecified artery: Secondary | ICD-10-CM | POA: Diagnosis not present

## 2013-08-09 DIAGNOSIS — N189 Chronic kidney disease, unspecified: Secondary | ICD-10-CM | POA: Diagnosis not present

## 2013-08-09 DIAGNOSIS — Y832 Surgical operation with anastomosis, bypass or graft as the cause of abnormal reaction of the patient, or of later complication, without mention of misadventure at the time of the procedure: Secondary | ICD-10-CM | POA: Insufficient documentation

## 2013-08-09 DIAGNOSIS — I4891 Unspecified atrial fibrillation: Secondary | ICD-10-CM | POA: Diagnosis not present

## 2013-08-09 DIAGNOSIS — I129 Hypertensive chronic kidney disease with stage 1 through stage 4 chronic kidney disease, or unspecified chronic kidney disease: Secondary | ICD-10-CM | POA: Insufficient documentation

## 2013-08-09 HISTORY — PX: FISTULOGRAM: SHX5832

## 2013-08-09 LAB — POCT I-STAT, CHEM 8
BUN: 31 mg/dL — ABNORMAL HIGH (ref 6–23)
Calcium, Ion: 1.2 mmol/L (ref 1.12–1.23)
Chloride: 99 mEq/L (ref 96–112)
Creatinine, Ser: 4 mg/dL — ABNORMAL HIGH (ref 0.50–1.35)
Glucose, Bld: 84 mg/dL (ref 70–99)
HCT: 33 % — ABNORMAL LOW (ref 39.0–52.0)
Hemoglobin: 11.2 g/dL — ABNORMAL LOW (ref 13.0–17.0)
Potassium: 4.2 mEq/L (ref 3.5–5.1)
Sodium: 132 mEq/L — ABNORMAL LOW (ref 135–145)
TCO2: 24 mmol/L (ref 0–100)

## 2013-08-09 SURGERY — FISTULOGRAM
Anesthesia: LOCAL | Laterality: Right

## 2013-08-09 MED ORDER — LIDOCAINE HCL (PF) 1 % IJ SOLN
INTRAMUSCULAR | Status: AC
  Filled 2013-08-09: qty 30

## 2013-08-09 MED ORDER — HEPARIN (PORCINE) IN NACL 2-0.9 UNIT/ML-% IJ SOLN
INTRAMUSCULAR | Status: AC
  Filled 2013-08-09: qty 500

## 2013-08-09 NOTE — Op Note (Signed)
PATIENT: James Nicholson   MRN: [id number] DOB: 1966-03-04    DATE OF PROCEDURE: 08/09/2013  INDICATIONS: [name] is a 47 y.o. male with a poorly functioning right radiocephalic AV fistula. He presents for a fistulogram.  PROCEDURE:  1. Ultrasound-guided access to right radiocephalic AV fistula 2. fistulogram right radiocephalic AV fistula  SURGEON: Judeth Cornfield. Scot Dock, MD, FACS  ANESTHESIA: local   EBL: minimal  TECHNIQUE: The patient was brought to the peripheral vascular lab. The right upper extremity was prepped and draped in the usual sterile fashion. After the skin was infiltrated with 1% lidocaine, and under ultrasound guidance, the proximal fistula was cannulated with a micropuncture needle and a micropuncture sheath introduced over a wire. A fistulogram was then obtained and the fistula was examined from the wrist to the central veins. In order to allow reflux into the proximal fistula and radial artery and blood pressure cuff was inflated to the supra-systolic pressures. At the completion the procedure a 4-0 Monocryl suture was placed for hemostasis.  FINDINGS:  1. There is no narrowing at the arterial anastomosis. 2. The fistula is widely patent. The forearm cephalic vein empties into the basilic vein. Upper arm cephalic vein is absent. 3. There is no central venous stenosis. 4. There is some slight irregularity of the fistula in the proximal forearm but no focal stenosis. 5. There are 4 competing branches in the fistula.  CLINICAL NOTE: he will be scheduled for ligation of competing branches on a nondialysis day. He dialyzes on Tuesdays Thursdays and Saturdays.  Deitra Mayo, MD, FACS [facility]  DATE OF DICTATION:   08/09/2013

## 2013-08-09 NOTE — H&P (View-Only) (Signed)
[facility]  Patient name: James Nicholson MRN: [id number] DOB: 08-09-1966 Sex: male  REASON FOR VISIT: poorly maturing right forearm AV fistula.  HPI: James Nicholson is a 47 y.o. male who dialyzes on Tuesdays Thursdays and Saturdays. He had a right forearm AV fistula placed on 02/10/2013. He was seen in follow up on 04/13/2013. At that time it looks like his fistula was maturing nicely. He sent back today as his fistula has not been working adequately. He has a right IJ tunneled dialysis catheter. They use one medial and the fistula and uses catheter for the other port. He has no specific complaints referrable to his right arm. He denies any recent uremic symptoms. He denies nausea vomiting fatigue anorexia or palpitations.   Past Medical History  Diagnosis Date  . Hypertension   . Dyslipidemia   . Anemia   . Cellulitis   . Atrial fibrillation   . Enterobacter sepsis   . Schizophrenia   . Chronic kidney disease   . Hyperlipidemia    Family History  Problem Relation Age of Onset  . CAD Mother    SOCIAL HISTORY: History  Substance Use Topics  . Smoking status: Former Smoker    Quit date: 10/28/2008  . Smokeless tobacco: Never Used  . Alcohol Use: No   No Known Allergies Current Outpatient Prescriptions  Medication Sig Dispense Refill  . B Complex-C-Folic Acid (DIALYVITE PO) Take 1 tablet by mouth daily.      . calcium acetate (PHOSLO) 667 MG capsule Take 667 mg by mouth daily.      . insulin detemir (LEVEMIR) 100 UNIT/ML injection Inject 0.05 mLs (5 Units total) into the skin at bedtime.  10 mL  0  . omeprazole (PRILOSEC) 20 MG capsule Take 20 mg by mouth daily.      . predniSONE (DELTASONE) 20 MG tablet Take 1.5 tablets (30 mg total) by mouth daily.  30 tablet  1  . predniSONE (DELTASONE) 5 MG tablet As directed  30 tablet  1  . risperiDONE (RISPERDAL) 0.5 MG tablet Take 0.25 mg by mouth daily.        No current facility-administered  medications for this visit.   REVIEW OF SYSTEMS: Valu.Nieves ] denotes positive finding; [  ] denotes negative finding  CARDIOVASCULAR:  [ ]  chest pain   [ ]  chest pressure   [ ]  palpitations   [ ]  orthopnea   [ ]  dyspnea on exertion   [ ]  claudication   [ ]  rest pain   [ ]  DVT   [ ]  phlebitis PULMONARY:   [ ]  productive cough   [ ]  asthma   [ ]  wheezing NEUROLOGIC:   [ ]  weakness  [ ]  paresthesias  [ ]  aphasia  [ ]  amaurosis  [ ]  dizziness HEMATOLOGIC:   [ ]  bleeding problems   [ ]  clotting disorders MUSCULOSKELETAL:  [ ]  joint pain   [ ]  joint swelling [ ]  leg swelling GASTROINTESTINAL: [ ]   blood in stool  [ ]   hematemesis GENITOURINARY:  [ ]   dysuria  [ ]   hematuria PSYCHIATRIC:  [ ]  history of major depression INTEGUMENTARY:  [ ]  rashes  [ ]  ulcers CONSTITUTIONAL:  [ ]  fever   [ ]  chills  PHYSICAL EXAM: Filed Vitals:   08/04/13 1003  BP: 143/93  Pulse: 76  Height: 5\' 6"  (1.676 m)  Weight: 150 lb (68.04 kg)  SpO2: 100%   Body mass index is 24.22 kg/(m^2).  GENERAL: The patient is a well-nourished male, in no acute distress. The vital signs are documented above. CARDIOVASCULAR: There is a regular rate and rhythm.  PULMONARY: There is good air exchange bilaterally without wheezing or rales. NEUROLOGIC: No focal weakness or paresthesias are detected. SKIN: There are no ulcers or rashes noted. His right forearm AV fistula has an excellent bruit and thrill. It is not especially pulsatile. It seems to be working adequately.  DATA:  Shows that his upper arm cephalic vein is occluded but the forearm cephalic vein empties into the basilic system. Diameters of the fistula range from 0.3 1.52 cm in its are not adequate at this point. There are some small competing branches also some areas of increased velocity suggesting possible stenoses.  MEDICAL ISSUES: I recommended that we proceed with a fistulogram to further evaluate his fistula. We have discussed the procedure potential complications we'll  make further recommendations pending these results. If he has areas of stenosis amenable to venoplasty we will proceed at the same time. If the branches appear to be significant he may require ligation of the competing branches in the operating room.  [place] [facility] [place] Beeper: [phone]

## 2013-08-09 NOTE — Interval H&P Note (Signed)
History and Physical Interval Note:  08/09/2013 7:26 AM  James Nicholson  has presented today for surgery, with the diagnosis of instage renal  The various methods of treatment have been discussed with the patient and family. After consideration of risks, benefits and other options for treatment, the patient has consented to  Procedure(s): FISTULOGRAM (Right) as a surgical intervention .  The patient's history has been reviewed, patient examined, no change in status, stable for surgery.  I have reviewed the patient's chart and labs.  Questions were answered to the patient's satisfaction.     DICKSON,CHRISTOPHER S

## 2013-08-10 ENCOUNTER — Other Ambulatory Visit: Payer: Self-pay

## 2013-08-12 ENCOUNTER — Encounter (HOSPITAL_COMMUNITY): Payer: Self-pay | Admitting: *Deleted

## 2013-08-12 ENCOUNTER — Other Ambulatory Visit: Payer: Self-pay

## 2013-08-12 MED ORDER — DEXTROSE 5 % IV SOLN
1.5000 g | INTRAVENOUS | Status: AC
  Administered 2013-08-13: 1.5 g via INTRAVENOUS
  Filled 2013-08-12: qty 1.5

## 2013-08-12 NOTE — Progress Notes (Signed)
Spoke with Gerald Stabs, pt caregiver at group home regarding pt pre-op instructions. According to Gerald Stabs, pt brother Velcro Pozo will be transporting pt and interpreting  for pt on DOS.  Spoke with Tomi Bamberger (rep for [employer]) to make her aware of ICD peri-op physician orders.

## 2013-08-13 ENCOUNTER — Encounter (HOSPITAL_COMMUNITY): Payer: Self-pay | Admitting: Anesthesiology

## 2013-08-13 ENCOUNTER — Ambulatory Visit (HOSPITAL_COMMUNITY)
Admission: RE | Admit: 2013-08-13 | Discharge: 2013-08-13 | Disposition: A | Discharge status: Home / Self Care | Payer: Medicare Other | Source: Ambulatory Visit | Attending: Vascular Surgery | Admitting: Vascular Surgery

## 2013-08-13 ENCOUNTER — Encounter (HOSPITAL_COMMUNITY)
Admission: RE | Disposition: A | Discharge status: Home / Self Care | Payer: Self-pay | Source: Ambulatory Visit | Attending: Vascular Surgery

## 2013-08-13 ENCOUNTER — Encounter (HOSPITAL_COMMUNITY): Payer: Medicare Other | Admitting: Anesthesiology

## 2013-08-13 ENCOUNTER — Ambulatory Visit (HOSPITAL_COMMUNITY): Payer: Medicare Other | Admitting: Anesthesiology

## 2013-08-13 DIAGNOSIS — T82598A Other mechanical complication of other cardiac and vascular devices and implants, initial encounter: Secondary | ICD-10-CM | POA: Insufficient documentation

## 2013-08-13 DIAGNOSIS — Z79899 Other long term (current) drug therapy: Secondary | ICD-10-CM | POA: Diagnosis not present

## 2013-08-13 DIAGNOSIS — I120 Hypertensive chronic kidney disease with stage 5 chronic kidney disease or end stage renal disease: Secondary | ICD-10-CM | POA: Diagnosis not present

## 2013-08-13 DIAGNOSIS — T82898A Other specified complication of vascular prosthetic devices, implants and grafts, initial encounter: Secondary | ICD-10-CM | POA: Diagnosis not present

## 2013-08-13 DIAGNOSIS — F209 Schizophrenia, unspecified: Secondary | ICD-10-CM | POA: Diagnosis not present

## 2013-08-13 DIAGNOSIS — N186 End stage renal disease: Secondary | ICD-10-CM | POA: Insufficient documentation

## 2013-08-13 DIAGNOSIS — I10 Essential (primary) hypertension: Secondary | ICD-10-CM | POA: Diagnosis not present

## 2013-08-13 DIAGNOSIS — Y832 Surgical operation with anastomosis, bypass or graft as the cause of abnormal reaction of the patient, or of later complication, without mention of misadventure at the time of the procedure: Secondary | ICD-10-CM | POA: Insufficient documentation

## 2013-08-13 DIAGNOSIS — Z992 Dependence on renal dialysis: Secondary | ICD-10-CM | POA: Insufficient documentation

## 2013-08-13 DIAGNOSIS — I4891 Unspecified atrial fibrillation: Secondary | ICD-10-CM | POA: Diagnosis not present

## 2013-08-13 HISTORY — PX: LIGATION OF COMPETING BRANCHES OF ARTERIOVENOUS FISTULA: SHX5949

## 2013-08-13 LAB — POCT I-STAT 4, (NA,K, GLUC, HGB,HCT)
Glucose, Bld: 83 mg/dL (ref 70–99)
HCT: 33 % — ABNORMAL LOW (ref 39.0–52.0)
Hemoglobin: 11.2 g/dL — ABNORMAL LOW (ref 13.0–17.0)
Potassium: 3.6 mEq/L (ref 3.5–5.1)
Sodium: 137 mEq/L (ref 135–145)

## 2013-08-13 SURGERY — LIGATION OF COMPETING BRANCHES OF ARTERIOVENOUS FISTULA
Anesthesia: General | Site: Arm Lower | Laterality: Right | Wound class: Clean

## 2013-08-13 MED ORDER — SODIUM CHLORIDE 0.9 % IV SOLN: INTRAVENOUS | Status: DC

## 2013-08-13 MED ORDER — LIDOCAINE-EPINEPHRINE (PF) 1 %-1:200000 IJ SOLN
INTRAMUSCULAR | Status: DC | PRN
  Administered 2013-08-13: 30 mL

## 2013-08-13 MED ORDER — FENTANYL CITRATE 0.05 MG/ML IJ SOLN
INTRAMUSCULAR | Status: DC | PRN
  Administered 2013-08-13: 50 ug via INTRAVENOUS
  Administered 2013-08-13: 25 ug via INTRAVENOUS
  Administered 2013-08-13: 50 ug via INTRAVENOUS
  Administered 2013-08-13: 25 ug via INTRAVENOUS

## 2013-08-13 MED ORDER — PROPOFOL INFUSION 10 MG/ML OPTIME
INTRAVENOUS | Status: DC | PRN
  Administered 2013-08-13: 75 ug/kg/min via INTRAVENOUS

## 2013-08-13 MED ORDER — OXYCODONE-ACETAMINOPHEN 5-325 MG PO TABS: 1.0000 | ORAL_TABLET | Freq: Four times a day (QID) | ORAL | Status: DC | PRN

## 2013-08-13 MED ORDER — SODIUM CHLORIDE 0.9 % IV SOLN
INTRAVENOUS | Status: DC | PRN
  Administered 2013-08-13: 07:00:00 via INTRAVENOUS

## 2013-08-13 MED ORDER — 0.9 % SODIUM CHLORIDE (POUR BTL) OPTIME
TOPICAL | Status: DC | PRN
  Administered 2013-08-13: 1000 mL

## 2013-08-13 MED ORDER — LIDOCAINE-EPINEPHRINE (PF) 1 %-1:200000 IJ SOLN
INTRAMUSCULAR | Status: AC
  Filled 2013-08-13: qty 10

## 2013-08-13 MED ORDER — MIDAZOLAM HCL 5 MG/5ML IJ SOLN
INTRAMUSCULAR | Status: DC | PRN
  Administered 2013-08-13: 2 mg via INTRAVENOUS

## 2013-08-13 MED ORDER — LIDOCAINE HCL (CARDIAC) 20 MG/ML IV SOLN
INTRAVENOUS | Status: DC | PRN
  Administered 2013-08-13: 50 mg via INTRAVENOUS

## 2013-08-13 MED ORDER — PROPOFOL 10 MG/ML IV BOLUS
INTRAVENOUS | Status: DC | PRN
  Administered 2013-08-13: 40 mg via INTRAVENOUS

## 2013-08-13 SURGICAL SUPPLY — 47 items
CANISTER SUCTION 2500CC (MISCELLANEOUS) ×2 IMPLANT
CATH EMB 3FR 80CM (CATHETERS) ×2 IMPLANT
CLIP TI MEDIUM 6 (CLIP) ×4 IMPLANT
CLIP TI WIDE RED SMALL 6 (CLIP) ×6 IMPLANT
COVER PROBE W GEL 5X96 (DRAPES) ×2 IMPLANT
COVER SURGICAL LIGHT HANDLE (MISCELLANEOUS) ×2 IMPLANT
DERMABOND ADVANCED (GAUZE/BANDAGES/DRESSINGS) ×1
DERMABOND ADVANCED .7 DNX12 (GAUZE/BANDAGES/DRESSINGS) ×1 IMPLANT
ELECT CAUTERY BLADE 6.4 (BLADE) ×2 IMPLANT
ELECT REM PT RETURN 9FT ADLT (ELECTROSURGICAL) ×2
ELECTRODE REM PT RTRN 9FT ADLT (ELECTROSURGICAL) ×1 IMPLANT
GEL ULTRASOUND 20GR AQUASONIC (MISCELLANEOUS) IMPLANT
GLOVE BIOGEL PI IND STRL 6.5 (GLOVE) ×2 IMPLANT
GLOVE BIOGEL PI IND STRL 7.0 (GLOVE) ×1 IMPLANT
GLOVE BIOGEL PI IND STRL 7.5 (GLOVE) ×2 IMPLANT
GLOVE BIOGEL PI INDICATOR 6.5 (GLOVE) ×2
GLOVE BIOGEL PI INDICATOR 7.0 (GLOVE) ×1
GLOVE BIOGEL PI INDICATOR 7.5 (GLOVE) ×2
GLOVE ECLIPSE 6.5 STRL STRAW (GLOVE) ×4 IMPLANT
GLOVE SS BIOGEL STRL SZ 7 (GLOVE) ×2 IMPLANT
GLOVE SUPERSENSE BIOGEL SZ 7 (GLOVE) ×2
GLOVE SURG SS PI 7.5 STRL IVOR (GLOVE) ×2 IMPLANT
GOWN PREVENTION PLUS XXLARGE (GOWN DISPOSABLE) ×2 IMPLANT
GOWN STRL NON-REIN LRG LVL3 (GOWN DISPOSABLE) ×6 IMPLANT
HEMOSTAT SNOW SURGICEL 2X4 (HEMOSTASIS) IMPLANT
KIT BASIN OR (CUSTOM PROCEDURE TRAY) ×2 IMPLANT
KIT ROOM TURNOVER OR (KITS) ×2 IMPLANT
NEEDLE HYPO 25GX1X1/2 BEV (NEEDLE) ×2 IMPLANT
NS IRRIG 1000ML POUR BTL (IV SOLUTION) ×2 IMPLANT
PACK CV ACCESS (CUSTOM PROCEDURE TRAY) ×2 IMPLANT
PAD ARMBOARD 7.5X6 YLW CONV (MISCELLANEOUS) ×4 IMPLANT
PENCIL BUTTON HOLSTER BLD 10FT (ELECTRODE) ×2 IMPLANT
SLEEVE SURGEON STRL (DRAPES) ×2 IMPLANT
STAPLER VISISTAT 35W (STAPLE) IMPLANT
SUT ETHILON 3 0 PS 1 (SUTURE) IMPLANT
SUT PROLENE 6 0 BV (SUTURE) ×6 IMPLANT
SUT SILK 0 TIES 10X30 (SUTURE) ×2 IMPLANT
SUT VIC AB 3-0 SH 27 (SUTURE) ×2
SUT VIC AB 3-0 SH 27X BRD (SUTURE) ×2 IMPLANT
SUT VICRYL 4-0 PS2 18IN ABS (SUTURE) ×4 IMPLANT
SWAB COLLECTION DEVICE MRSA (MISCELLANEOUS) IMPLANT
SYR TB 1ML 26GX3/8 SAFETY (SYRINGE) ×2 IMPLANT
TOWEL OR 17X24 6PK STRL BLUE (TOWEL DISPOSABLE) ×2 IMPLANT
TOWEL OR 17X26 10 PK STRL BLUE (TOWEL DISPOSABLE) ×2 IMPLANT
TUBE ANAEROBIC SPECIMEN COL (MISCELLANEOUS) IMPLANT
UNDERPAD 30X30 INCONTINENT (UNDERPADS AND DIAPERS) ×2 IMPLANT
WATER STERILE IRR 1000ML POUR (IV SOLUTION) ×2 IMPLANT

## 2013-08-13 NOTE — Transfer of Care (Signed)
Immediate Anesthesia Transfer of Care Note  Patient: James Nicholson  Procedure(s) Performed: Procedure(s): LIGATION OF COMPETING BRANCHES X5 OF ARTERIOVENOUS FISTULA- RIGHT ARM (Right)  Patient Location: PACU  Anesthesia Type:MAC  Level of Consciousness: awake and alert   Airway & Oxygen Therapy: Patient Spontanous Breathing and Patient connected to nasal cannula oxygen  Post-op Assessment: Report given to PACU RN and Post -op Vital signs reviewed and stable  Post vital signs: Reviewed and stable  Complications: No apparent anesthesia complications

## 2013-08-13 NOTE — Progress Notes (Signed)
Dialysis access report faxed to [facility]

## 2013-08-13 NOTE — Op Note (Signed)
[facility]  Patient name: James Nicholson MRN: [id number] DOB: 1966-05-16 Sex: male  08/13/2013 Pre-operative Diagnosis: Right radiocephalic fistula Post-operative diagnosis:  Same Surgeon:  Eldridge Abrahams Assistants:  Lennie Muckle Procedure:   Ligation of competing branches x5 Anesthesia:  MAC Blood Loss:  See anesthesia record Specimens:  None  Findings:  The vein had a significant diameter change in the distal forearm.  No associated branch was at this level.  The vein was in significant spasm, measuring less than 2 mm within the 2 proximal incisions.  I ligated 5 branches within the fistula.  I essentially dissected out the fistula from the elbow to the wrist to make sure all branches were ligated since the vein was so small.  I did pass a #3 Fogarty which met resistance and would not pass above the antecubital crease.  With the balloon inflated some of the spasm resolved and the fistula had a reasonable thrill of the end of the procedure have a dilated to approximately 4 mm  Indications:  The patient has a non-maturing fistula.  Imaging studies revealed for large branches.  Procedure:  The patient was identified in the holding area and taken to [facility]  The patient was then placed supine on the table. MAC anesthesia was administered.  The patient was prepped and draped in the usual sterile fashion.  A time out was called and antibiotics were administered.  Ultrasound was used to map the course of the cephalic vein in the forearm.  Branches were marked with an 8 pen.  1% lidocaine was used for local anesthesia.  I initially began near the antecubital crease.  A #10 blade was used to make an incision.  Dissection was carried down to the vein which measured only approximately 2 mm.  At this point I thought I may have just dissected out the branch and so I made a second incision in the midportion of the arm and at this level the vein also was less than 2  mm.  I made a third incision near the proximal fistula at the wrist.  This level the vein was of adequate diameter.  There were 2 branches within this incision there were ligated between 3-0 silk ties.  I then dissected out the vein under the skin bridge to make sure that what was mobilized proximally was actually the cephalic vein another branch.  I was able to pass a blue vessel loop between the incisions.  There was another small branch that was ligated.  I was then able to ligate the 2 other branches seen on imaging studies.  Told 5 branches were ligated.  I was still very disappointed with the size of the vein.  I therefore elected to make a small venotomy and passed a #3 Fogarty catheter to help resolve some of the spasm.  The balloon was inflated and pulled back.  With this the vein didn't dilate some.  The venotomy was closed with 6-0 Prolene.  There was a thrill within the fistula up to the antecubital crease.  Once hemostasis was satisfactory the incisions were closed with 2 layers of 3-0 Vicryl.  Dermabond placed.   Disposition:  To PACU in stable condition.   Theotis Burrow, M.D. [facility] [place] Office: [phone] Pager:  [phone]

## 2013-08-13 NOTE — H&P (View-Only) (Signed)
Vascular and Vein Specialist of [facility]  Patient name: James Nicholson MRN: [id number] DOB: October 06, 1966 Sex: male  REASON FOR VISIT: poorly maturing right forearm AV fistula.  HPI: James Nicholson is a 47 y.o. male who dialyzes on Tuesdays Thursdays and Saturdays. He had a right forearm AV fistula placed on 02/10/2013. He was seen in follow up on 04/13/2013. At that time it looks like his fistula was maturing nicely. He sent back today as his fistula has not been working adequately. He has a right IJ tunneled dialysis catheter. They use one medial and the fistula and uses catheter for the other port. He has no specific complaints referrable to his right arm. He denies any recent uremic symptoms. He denies nausea vomiting fatigue anorexia or palpitations.   Past Medical History  Diagnosis Date  . Hypertension   . Dyslipidemia   . Anemia   . Cellulitis   . Atrial fibrillation   . Enterobacter sepsis   . Schizophrenia   . Chronic kidney disease   . Hyperlipidemia    Family History  Problem Relation Age of Onset  . CAD Mother    SOCIAL HISTORY: History  Substance Use Topics  . Smoking status: Former Smoker    Quit date: 10/28/2008  . Smokeless tobacco: Never Used  . Alcohol Use: No   No Known Allergies Current Outpatient Prescriptions  Medication Sig Dispense Refill  . B Complex-C-Folic Acid (DIALYVITE PO) Take 1 tablet by mouth daily.      . calcium acetate (PHOSLO) 667 MG capsule Take 667 mg by mouth daily.      . insulin detemir (LEVEMIR) 100 UNIT/ML injection Inject 0.05 mLs (5 Units total) into the skin at bedtime.  10 mL  0  . omeprazole (PRILOSEC) 20 MG capsule Take 20 mg by mouth daily.      . predniSONE (DELTASONE) 20 MG tablet Take 1.5 tablets (30 mg total) by mouth daily.  30 tablet  1  . predniSONE (DELTASONE) 5 MG tablet As directed  30 tablet  1  . risperiDONE (RISPERDAL) 0.5 MG tablet Take 0.25 mg by mouth daily.        No current facility-administered  medications for this visit.   REVIEW OF SYSTEMS: Valu.Nieves ] denotes positive finding; [  ] denotes negative finding  CARDIOVASCULAR:  [ ]  chest pain   [ ]  chest pressure   [ ]  palpitations   [ ]  orthopnea   [ ]  dyspnea on exertion   [ ]  claudication   [ ]  rest pain   [ ]  DVT   [ ]  phlebitis PULMONARY:   [ ]  productive cough   [ ]  asthma   [ ]  wheezing NEUROLOGIC:   [ ]  weakness  [ ]  paresthesias  [ ]  aphasia  [ ]  amaurosis  [ ]  dizziness HEMATOLOGIC:   [ ]  bleeding problems   [ ]  clotting disorders MUSCULOSKELETAL:  [ ]  joint pain   [ ]  joint swelling [ ]  leg swelling GASTROINTESTINAL: [ ]   blood in stool  [ ]   hematemesis GENITOURINARY:  [ ]   dysuria  [ ]   hematuria PSYCHIATRIC:  [ ]  history of major depression INTEGUMENTARY:  [ ]  rashes  [ ]  ulcers CONSTITUTIONAL:  [ ]  fever   [ ]  chills  PHYSICAL EXAM: Filed Vitals:   08/04/13 1003  BP: 143/93  Pulse: 76  Height: 5\' 6"  (1.676 m)  Weight: 150 lb (68.04 kg)  SpO2: 100%   Body mass index is 24.22 kg/(m^2).  GENERAL: The patient is a well-nourished male, in no acute distress. The vital signs are documented above. CARDIOVASCULAR: There is a regular rate and rhythm.  PULMONARY: There is good air exchange bilaterally without wheezing or rales. NEUROLOGIC: No focal weakness or paresthesias are detected. SKIN: There are no ulcers or rashes noted. His right forearm AV fistula has an excellent bruit and thrill. It is not especially pulsatile. It seems to be working adequately.  DATA:  Shows that his upper arm cephalic vein is occluded but the forearm cephalic vein empties into the basilic system. Diameters of the fistula range from 0.3 1.52 cm in its are not adequate at this point. There are some small competing branches also some areas of increased velocity suggesting possible stenoses.  MEDICAL ISSUES: I recommended that we proceed with a fistulogram to further evaluate his fistula. We have discussed the procedure potential complications we'll  make further recommendations pending these results. If he has areas of stenosis amenable to venoplasty we will proceed at the same time. If the branches appear to be significant he may require ligation of the competing branches in the operating room.  [place] [facility] [place] Beeper: [phone]

## 2013-08-13 NOTE — Progress Notes (Signed)
Used pacific interpreter to obtain consent and get preop information

## 2013-08-13 NOTE — Anesthesia Preprocedure Evaluation (Signed)
Anesthesia Evaluation  Patient identified by MRN, date of birth, ID band Patient awake    Reviewed: Allergy & Precautions, H&P , NPO status , Patient's Chart, lab work & pertinent test results  Airway       Dental   Pulmonary pneumonia -,          Cardiovascular hypertension, + dysrhythmias + Cardiac Defibrillator     Neuro/Psych PSYCHIATRIC DISORDERS    GI/Hepatic   Endo/Other    Renal/GU ESRF and DialysisRenal disease     Musculoskeletal   Abdominal   Peds  Hematology   Anesthesia Other Findings   Reproductive/Obstetrics                           Anesthesia Physical Anesthesia Plan  ASA: III  Anesthesia Plan: General   Post-op Pain Management:    Induction: Intravenous  Airway Management Planned: Oral ETT  Additional Equipment:   Intra-op Plan:   Post-operative Plan: Extubation in OR  Informed Consent: I have reviewed the patients History and Physical, chart, labs and discussed the procedure including the risks, benefits and alternatives for the proposed anesthesia with the patient or authorized representative who has indicated his/her understanding and acceptance.     Plan Discussed with: CRNA, Anesthesiologist and Surgeon  Anesthesia Plan Comments:         Anesthesia Quick Evaluation

## 2013-08-13 NOTE — Preoperative (Signed)
Beta Blockers   Reason not to administer Beta Blockers:Not Applicable 

## 2013-08-13 NOTE — Anesthesia Postprocedure Evaluation (Signed)
  Anesthesia Post-op Note  Patient: James Nicholson  Procedure(s) Performed: Procedure(s): LIGATION OF COMPETING BRANCHES X5 OF ARTERIOVENOUS FISTULA- RIGHT ARM (Right)  Patient Location: PACU  Anesthesia Type:MAC  Level of Consciousness: awake, alert , oriented and patient cooperative  Airway and Oxygen Therapy: Patient Spontanous Breathing  Post-op Pain: mild  Post-op Assessment: Post-op Vital signs reviewed, Patient's Cardiovascular Status Stable, Respiratory Function Stable, Patent Airway, No signs of Nausea or vomiting and Pain level controlled  Post-op Vital Signs: stable  Complications: No apparent anesthesia complications

## 2013-08-13 NOTE — Interval H&P Note (Signed)
History and Physical Interval Note:  08/13/2013 7:27 AM  James Nicholson  has presented today for surgery, with the diagnosis of End Stage Renal Disease  Complications of right arm AVF  The various methods of treatment have been discussed with the patient and family. After consideration of risks, benefits and other options for treatment, the patient has consented to  Procedure(s): LIGATION OF COMPETING BRANCHES OF ARTERIOVENOUS FISTULA- RIGHT ARM X 4 (Right) as a surgical intervention .  The patient's history has been reviewed, patient examined, no change in status, stable for surgery.  I have reviewed the patient's chart and labs.  Questions were answered to the patient's satisfaction.     Jahziel Sinn IV, V. [name]

## 2013-08-16 ENCOUNTER — Encounter (HOSPITAL_COMMUNITY): Payer: Self-pay | Admitting: Surgery

## 2013-08-18 NOTE — Addendum Note (Signed)
Addendum created 08/18/13 1821 by Warrick Parisian, MD   Modules edited: Anesthesia Attestations, Anesthesia Events

## 2013-08-27 DIAGNOSIS — N186 End stage renal disease: Secondary | ICD-10-CM | POA: Diagnosis not present

## 2013-08-28 DIAGNOSIS — E119 Type 2 diabetes mellitus without complications: Secondary | ICD-10-CM | POA: Diagnosis not present

## 2013-08-28 DIAGNOSIS — D631 Anemia in chronic kidney disease: Secondary | ICD-10-CM | POA: Diagnosis not present

## 2013-08-28 DIAGNOSIS — D509 Iron deficiency anemia, unspecified: Secondary | ICD-10-CM | POA: Diagnosis not present

## 2013-08-28 DIAGNOSIS — N186 End stage renal disease: Secondary | ICD-10-CM | POA: Diagnosis not present

## 2013-08-28 DIAGNOSIS — Z23 Encounter for immunization: Secondary | ICD-10-CM | POA: Diagnosis not present

## 2013-09-17 ENCOUNTER — Ambulatory Visit: Payer: Medicaid Other | Admitting: Adult Health

## 2013-09-26 DIAGNOSIS — N186 End stage renal disease: Secondary | ICD-10-CM | POA: Diagnosis not present

## 2013-09-28 DIAGNOSIS — D631 Anemia in chronic kidney disease: Secondary | ICD-10-CM | POA: Diagnosis not present

## 2013-09-28 DIAGNOSIS — D509 Iron deficiency anemia, unspecified: Secondary | ICD-10-CM | POA: Diagnosis not present

## 2013-09-28 DIAGNOSIS — N186 End stage renal disease: Secondary | ICD-10-CM | POA: Diagnosis not present

## 2013-09-28 DIAGNOSIS — E119 Type 2 diabetes mellitus without complications: Secondary | ICD-10-CM | POA: Diagnosis not present

## 2013-09-30 ENCOUNTER — Ambulatory Visit: Payer: Medicaid Other | Admitting: Adult Health

## 2013-10-18 ENCOUNTER — Ambulatory Visit: Payer: Medicaid Other | Admitting: Adult Health

## 2013-10-27 DIAGNOSIS — N186 End stage renal disease: Secondary | ICD-10-CM | POA: Diagnosis not present

## 2013-10-30 DIAGNOSIS — E119 Type 2 diabetes mellitus without complications: Secondary | ICD-10-CM | POA: Diagnosis not present

## 2013-10-30 DIAGNOSIS — N2581 Secondary hyperparathyroidism of renal origin: Secondary | ICD-10-CM | POA: Diagnosis not present

## 2013-10-30 DIAGNOSIS — D509 Iron deficiency anemia, unspecified: Secondary | ICD-10-CM | POA: Diagnosis not present

## 2013-10-30 DIAGNOSIS — D631 Anemia in chronic kidney disease: Secondary | ICD-10-CM | POA: Diagnosis not present

## 2013-10-30 DIAGNOSIS — N039 Chronic nephritic syndrome with unspecified morphologic changes: Secondary | ICD-10-CM | POA: Diagnosis not present

## 2013-10-30 DIAGNOSIS — N186 End stage renal disease: Secondary | ICD-10-CM | POA: Diagnosis not present

## 2013-11-05 DIAGNOSIS — T82898A Other specified complication of vascular prosthetic devices, implants and grafts, initial encounter: Secondary | ICD-10-CM | POA: Diagnosis not present

## 2013-11-05 DIAGNOSIS — I871 Compression of vein: Secondary | ICD-10-CM | POA: Diagnosis not present

## 2013-11-05 DIAGNOSIS — N186 End stage renal disease: Secondary | ICD-10-CM | POA: Diagnosis not present

## 2013-11-08 DIAGNOSIS — F259 Schizoaffective disorder, unspecified: Secondary | ICD-10-CM | POA: Diagnosis not present

## 2013-11-11 DIAGNOSIS — E1129 Type 2 diabetes mellitus with other diabetic kidney complication: Secondary | ICD-10-CM | POA: Diagnosis not present

## 2013-11-12 ENCOUNTER — Ambulatory Visit: Payer: Medicare Other | Admitting: Emergency Medicine

## 2013-11-27 DIAGNOSIS — N186 End stage renal disease: Secondary | ICD-10-CM | POA: Diagnosis not present

## 2013-11-30 DIAGNOSIS — N2581 Secondary hyperparathyroidism of renal origin: Secondary | ICD-10-CM | POA: Diagnosis not present

## 2013-11-30 DIAGNOSIS — N186 End stage renal disease: Secondary | ICD-10-CM | POA: Diagnosis not present

## 2013-11-30 DIAGNOSIS — D631 Anemia in chronic kidney disease: Secondary | ICD-10-CM | POA: Diagnosis not present

## 2013-11-30 DIAGNOSIS — E119 Type 2 diabetes mellitus without complications: Secondary | ICD-10-CM | POA: Diagnosis not present

## 2013-11-30 DIAGNOSIS — D509 Iron deficiency anemia, unspecified: Secondary | ICD-10-CM | POA: Diagnosis not present

## 2013-12-02 DIAGNOSIS — N2581 Secondary hyperparathyroidism of renal origin: Secondary | ICD-10-CM | POA: Diagnosis not present

## 2013-12-02 DIAGNOSIS — N186 End stage renal disease: Secondary | ICD-10-CM | POA: Diagnosis not present

## 2013-12-02 DIAGNOSIS — E119 Type 2 diabetes mellitus without complications: Secondary | ICD-10-CM | POA: Diagnosis not present

## 2013-12-02 DIAGNOSIS — D631 Anemia in chronic kidney disease: Secondary | ICD-10-CM | POA: Diagnosis not present

## 2013-12-02 DIAGNOSIS — D509 Iron deficiency anemia, unspecified: Secondary | ICD-10-CM | POA: Diagnosis not present

## 2013-12-02 DIAGNOSIS — N039 Chronic nephritic syndrome with unspecified morphologic changes: Secondary | ICD-10-CM | POA: Diagnosis not present

## 2013-12-04 DIAGNOSIS — D631 Anemia in chronic kidney disease: Secondary | ICD-10-CM | POA: Diagnosis not present

## 2013-12-04 DIAGNOSIS — D509 Iron deficiency anemia, unspecified: Secondary | ICD-10-CM | POA: Diagnosis not present

## 2013-12-04 DIAGNOSIS — N2581 Secondary hyperparathyroidism of renal origin: Secondary | ICD-10-CM | POA: Diagnosis not present

## 2013-12-04 DIAGNOSIS — N186 End stage renal disease: Secondary | ICD-10-CM | POA: Diagnosis not present

## 2013-12-04 DIAGNOSIS — E119 Type 2 diabetes mellitus without complications: Secondary | ICD-10-CM | POA: Diagnosis not present

## 2013-12-07 DIAGNOSIS — N2581 Secondary hyperparathyroidism of renal origin: Secondary | ICD-10-CM | POA: Diagnosis not present

## 2013-12-07 DIAGNOSIS — D631 Anemia in chronic kidney disease: Secondary | ICD-10-CM | POA: Diagnosis not present

## 2013-12-07 DIAGNOSIS — E119 Type 2 diabetes mellitus without complications: Secondary | ICD-10-CM | POA: Diagnosis not present

## 2013-12-07 DIAGNOSIS — D509 Iron deficiency anemia, unspecified: Secondary | ICD-10-CM | POA: Diagnosis not present

## 2013-12-07 DIAGNOSIS — N186 End stage renal disease: Secondary | ICD-10-CM | POA: Diagnosis not present

## 2013-12-09 DIAGNOSIS — N2581 Secondary hyperparathyroidism of renal origin: Secondary | ICD-10-CM | POA: Diagnosis not present

## 2013-12-09 DIAGNOSIS — D509 Iron deficiency anemia, unspecified: Secondary | ICD-10-CM | POA: Diagnosis not present

## 2013-12-09 DIAGNOSIS — D631 Anemia in chronic kidney disease: Secondary | ICD-10-CM | POA: Diagnosis not present

## 2013-12-09 DIAGNOSIS — E119 Type 2 diabetes mellitus without complications: Secondary | ICD-10-CM | POA: Diagnosis not present

## 2013-12-09 DIAGNOSIS — N186 End stage renal disease: Secondary | ICD-10-CM | POA: Diagnosis not present

## 2013-12-11 DIAGNOSIS — D631 Anemia in chronic kidney disease: Secondary | ICD-10-CM | POA: Diagnosis not present

## 2013-12-11 DIAGNOSIS — E119 Type 2 diabetes mellitus without complications: Secondary | ICD-10-CM | POA: Diagnosis not present

## 2013-12-11 DIAGNOSIS — D509 Iron deficiency anemia, unspecified: Secondary | ICD-10-CM | POA: Diagnosis not present

## 2013-12-11 DIAGNOSIS — N2581 Secondary hyperparathyroidism of renal origin: Secondary | ICD-10-CM | POA: Diagnosis not present

## 2013-12-11 DIAGNOSIS — N186 End stage renal disease: Secondary | ICD-10-CM | POA: Diagnosis not present

## 2013-12-14 DIAGNOSIS — N186 End stage renal disease: Secondary | ICD-10-CM | POA: Diagnosis not present

## 2013-12-14 DIAGNOSIS — D509 Iron deficiency anemia, unspecified: Secondary | ICD-10-CM | POA: Diagnosis not present

## 2013-12-14 DIAGNOSIS — D631 Anemia in chronic kidney disease: Secondary | ICD-10-CM | POA: Diagnosis not present

## 2013-12-14 DIAGNOSIS — E119 Type 2 diabetes mellitus without complications: Secondary | ICD-10-CM | POA: Diagnosis not present

## 2013-12-14 DIAGNOSIS — N2581 Secondary hyperparathyroidism of renal origin: Secondary | ICD-10-CM | POA: Diagnosis not present

## 2013-12-16 DIAGNOSIS — E119 Type 2 diabetes mellitus without complications: Secondary | ICD-10-CM | POA: Diagnosis not present

## 2013-12-16 DIAGNOSIS — N186 End stage renal disease: Secondary | ICD-10-CM | POA: Diagnosis not present

## 2013-12-16 DIAGNOSIS — N039 Chronic nephritic syndrome with unspecified morphologic changes: Secondary | ICD-10-CM | POA: Diagnosis not present

## 2013-12-16 DIAGNOSIS — D509 Iron deficiency anemia, unspecified: Secondary | ICD-10-CM | POA: Diagnosis not present

## 2013-12-16 DIAGNOSIS — N2581 Secondary hyperparathyroidism of renal origin: Secondary | ICD-10-CM | POA: Diagnosis not present

## 2013-12-16 DIAGNOSIS — D631 Anemia in chronic kidney disease: Secondary | ICD-10-CM | POA: Diagnosis not present

## 2013-12-18 DIAGNOSIS — E119 Type 2 diabetes mellitus without complications: Secondary | ICD-10-CM | POA: Diagnosis not present

## 2013-12-18 DIAGNOSIS — N186 End stage renal disease: Secondary | ICD-10-CM | POA: Diagnosis not present

## 2013-12-18 DIAGNOSIS — D509 Iron deficiency anemia, unspecified: Secondary | ICD-10-CM | POA: Diagnosis not present

## 2013-12-18 DIAGNOSIS — D631 Anemia in chronic kidney disease: Secondary | ICD-10-CM | POA: Diagnosis not present

## 2013-12-18 DIAGNOSIS — N2581 Secondary hyperparathyroidism of renal origin: Secondary | ICD-10-CM | POA: Diagnosis not present

## 2013-12-21 ENCOUNTER — Encounter (HOSPITAL_COMMUNITY): Payer: Self-pay | Admitting: Emergency Medicine

## 2013-12-21 ENCOUNTER — Inpatient Hospital Stay (HOSPITAL_COMMUNITY)
Admission: EM | Admit: 2013-12-21 | Discharge: 2013-12-25 | DRG: 193 | Disposition: A | Discharge status: Home / Self Care | Payer: Medicare Other | Attending: Internal Medicine | Admitting: Internal Medicine

## 2013-12-21 ENCOUNTER — Emergency Department (HOSPITAL_COMMUNITY): Payer: Medicare Other

## 2013-12-21 ENCOUNTER — Inpatient Hospital Stay (HOSPITAL_COMMUNITY): Payer: Medicare Other

## 2013-12-21 DIAGNOSIS — I10 Essential (primary) hypertension: Secondary | ICD-10-CM | POA: Diagnosis not present

## 2013-12-21 DIAGNOSIS — I509 Heart failure, unspecified: Secondary | ICD-10-CM | POA: Diagnosis present

## 2013-12-21 DIAGNOSIS — Z8249 Family history of ischemic heart disease and other diseases of the circulatory system: Secondary | ICD-10-CM | POA: Diagnosis not present

## 2013-12-21 DIAGNOSIS — R7611 Nonspecific reaction to tuberculin skin test without active tuberculosis: Secondary | ICD-10-CM

## 2013-12-21 DIAGNOSIS — R079 Chest pain, unspecified: Secondary | ICD-10-CM | POA: Diagnosis not present

## 2013-12-21 DIAGNOSIS — N019 Rapidly progressive nephritic syndrome with unspecified morphologic changes: Secondary | ICD-10-CM | POA: Diagnosis not present

## 2013-12-21 DIAGNOSIS — Z794 Long term (current) use of insulin: Secondary | ICD-10-CM

## 2013-12-21 DIAGNOSIS — I059 Rheumatic mitral valve disease, unspecified: Secondary | ICD-10-CM | POA: Diagnosis not present

## 2013-12-21 DIAGNOSIS — R799 Abnormal finding of blood chemistry, unspecified: Secondary | ICD-10-CM | POA: Diagnosis not present

## 2013-12-21 DIAGNOSIS — I309 Acute pericarditis, unspecified: Secondary | ICD-10-CM | POA: Diagnosis not present

## 2013-12-21 DIAGNOSIS — E872 Acidosis, unspecified: Secondary | ICD-10-CM

## 2013-12-21 DIAGNOSIS — N2581 Secondary hyperparathyroidism of renal origin: Secondary | ICD-10-CM | POA: Diagnosis present

## 2013-12-21 DIAGNOSIS — I248 Other forms of acute ischemic heart disease: Secondary | ICD-10-CM | POA: Diagnosis present

## 2013-12-21 DIAGNOSIS — Z992 Dependence on renal dialysis: Secondary | ICD-10-CM | POA: Diagnosis not present

## 2013-12-21 DIAGNOSIS — F209 Schizophrenia, unspecified: Secondary | ICD-10-CM | POA: Diagnosis present

## 2013-12-21 DIAGNOSIS — Z95810 Presence of automatic (implantable) cardiac defibrillator: Secondary | ICD-10-CM

## 2013-12-21 DIAGNOSIS — M949 Disorder of cartilage, unspecified: Secondary | ICD-10-CM

## 2013-12-21 DIAGNOSIS — I129 Hypertensive chronic kidney disease with stage 1 through stage 4 chronic kidney disease, or unspecified chronic kidney disease: Secondary | ICD-10-CM | POA: Diagnosis not present

## 2013-12-21 DIAGNOSIS — I2489 Other forms of acute ischemic heart disease: Secondary | ICD-10-CM | POA: Diagnosis present

## 2013-12-21 DIAGNOSIS — E119 Type 2 diabetes mellitus without complications: Secondary | ICD-10-CM | POA: Diagnosis present

## 2013-12-21 DIAGNOSIS — T82598A Other mechanical complication of other cardiac and vascular devices and implants, initial encounter: Secondary | ICD-10-CM

## 2013-12-21 DIAGNOSIS — M899 Disorder of bone, unspecified: Secondary | ICD-10-CM | POA: Diagnosis present

## 2013-12-21 DIAGNOSIS — I4891 Unspecified atrial fibrillation: Secondary | ICD-10-CM | POA: Diagnosis present

## 2013-12-21 DIAGNOSIS — I4892 Unspecified atrial flutter: Secondary | ICD-10-CM | POA: Diagnosis not present

## 2013-12-21 DIAGNOSIS — I5042 Chronic combined systolic (congestive) and diastolic (congestive) heart failure: Secondary | ICD-10-CM | POA: Diagnosis present

## 2013-12-21 DIAGNOSIS — D649 Anemia, unspecified: Secondary | ICD-10-CM

## 2013-12-21 DIAGNOSIS — Z87891 Personal history of nicotine dependence: Secondary | ICD-10-CM | POA: Diagnosis not present

## 2013-12-21 DIAGNOSIS — I214 Non-ST elevation (NSTEMI) myocardial infarction: Secondary | ICD-10-CM

## 2013-12-21 DIAGNOSIS — N186 End stage renal disease: Secondary | ICD-10-CM | POA: Diagnosis not present

## 2013-12-21 DIAGNOSIS — J189 Pneumonia, unspecified organism: Secondary | ICD-10-CM | POA: Diagnosis not present

## 2013-12-21 DIAGNOSIS — I428 Other cardiomyopathies: Secondary | ICD-10-CM | POA: Diagnosis not present

## 2013-12-21 DIAGNOSIS — I219 Acute myocardial infarction, unspecified: Secondary | ICD-10-CM | POA: Diagnosis not present

## 2013-12-21 DIAGNOSIS — D638 Anemia in other chronic diseases classified elsewhere: Secondary | ICD-10-CM | POA: Diagnosis present

## 2013-12-21 DIAGNOSIS — N179 Acute kidney failure, unspecified: Secondary | ICD-10-CM

## 2013-12-21 DIAGNOSIS — R7989 Other specified abnormal findings of blood chemistry: Secondary | ICD-10-CM

## 2013-12-21 DIAGNOSIS — E785 Hyperlipidemia, unspecified: Secondary | ICD-10-CM | POA: Diagnosis present

## 2013-12-21 DIAGNOSIS — K922 Gastrointestinal hemorrhage, unspecified: Secondary | ICD-10-CM

## 2013-12-21 DIAGNOSIS — N189 Chronic kidney disease, unspecified: Secondary | ICD-10-CM

## 2013-12-21 DIAGNOSIS — I120 Hypertensive chronic kidney disease with stage 5 chronic kidney disease or end stage renal disease: Secondary | ICD-10-CM | POA: Diagnosis present

## 2013-12-21 DIAGNOSIS — T82898A Other specified complication of vascular prosthetic devices, implants and grafts, initial encounter: Secondary | ICD-10-CM

## 2013-12-21 DIAGNOSIS — M3130 Wegener's granulomatosis without renal involvement: Secondary | ICD-10-CM | POA: Diagnosis not present

## 2013-12-21 DIAGNOSIS — J988 Other specified respiratory disorders: Secondary | ICD-10-CM | POA: Diagnosis not present

## 2013-12-21 DIAGNOSIS — I4901 Ventricular fibrillation: Secondary | ICD-10-CM

## 2013-12-21 DIAGNOSIS — R778 Other specified abnormalities of plasma proteins: Secondary | ICD-10-CM | POA: Clinically undetermined

## 2013-12-21 DIAGNOSIS — R0602 Shortness of breath: Secondary | ICD-10-CM | POA: Diagnosis not present

## 2013-12-21 DIAGNOSIS — Z8611 Personal history of tuberculosis: Secondary | ICD-10-CM

## 2013-12-21 HISTORY — DX: Presence of automatic (implantable) cardiac defibrillator: Z95.810

## 2013-12-21 HISTORY — DX: Cardiomyopathy, unspecified: I42.9

## 2013-12-21 LAB — TROPONIN I
Troponin I: <0.3 ng/mL (ref <0.30)
Troponin I: <0.3 ng/mL (ref <0.30)

## 2013-12-21 LAB — COMPREHENSIVE METABOLIC PANEL
ALT: 12 U/L (ref 0–53)
AST: 13 U/L (ref 0–37)
Albumin: 3.6 g/dL (ref 3.5–5.2)
Alkaline Phosphatase: 67 U/L (ref 39–117)
BUN: 55 mg/dL — ABNORMAL HIGH (ref 6–23)
CO2: 25 mEq/L (ref 19–32)
Calcium: 9.3 mg/dL (ref 8.4–10.5)
Chloride: 92 mEq/L — ABNORMAL LOW (ref 96–112)
Creatinine, Ser: 7.14 mg/dL — ABNORMAL HIGH (ref 0.50–1.35)
GFR calc Af Amer: 9 mL/min — ABNORMAL LOW (ref >90)
GFR calc non Af Amer: 8 mL/min — ABNORMAL LOW (ref >90)
Glucose, Bld: 111 mg/dL — ABNORMAL HIGH (ref 70–99)
Potassium: 3.6 mEq/L — ABNORMAL LOW (ref 3.7–5.3)
Sodium: 136 mEq/L — ABNORMAL LOW (ref 137–147)
Total Bilirubin: 0.5 mg/dL (ref 0.3–1.2)
Total Protein: 7.3 g/dL (ref 6.0–8.3)

## 2013-12-21 LAB — CBC
HCT: 27.2 % — ABNORMAL LOW (ref 39.0–52.0)
Hemoglobin: 9.6 g/dL — ABNORMAL LOW (ref 13.0–17.0)
MCH: 31.8 pg (ref 26.0–34.0)
MCHC: 35.3 g/dL (ref 30.0–36.0)
MCV: 90.1 fL (ref 78.0–100.0)
Platelets: 204 10*3/uL (ref 150–400)
RBC: 3.02 MIL/uL — ABNORMAL LOW (ref 4.22–5.81)
RDW: 13.4 % (ref 11.5–15.5)
WBC: 13.9 10*3/uL — ABNORMAL HIGH (ref 4.0–10.5)

## 2013-12-21 LAB — MRSA PCR SCREENING: MRSA by PCR: NEGATIVE

## 2013-12-21 LAB — GLUCOSE, CAPILLARY: Glucose-Capillary: 119 mg/dL — ABNORMAL HIGH (ref 70–99)

## 2013-12-21 MED ORDER — CALCIUM ACETATE 667 MG PO CAPS
667.0000 mg | ORAL_CAPSULE | Freq: Every day | ORAL | Status: DC
  Administered 2013-12-22: 667 mg via ORAL
  Filled 2013-12-21 (×3): qty 1

## 2013-12-21 MED ORDER — ACETAMINOPHEN 650 MG RE SUPP: 650.0000 mg | Freq: Four times a day (QID) | RECTAL | Status: DC | PRN

## 2013-12-21 MED ORDER — HYDROMORPHONE HCL PF 1 MG/ML IJ SOLN: 1.0000 mg | INTRAMUSCULAR | Status: DC | PRN

## 2013-12-21 MED ORDER — ONDANSETRON HCL 4 MG/2ML IJ SOLN: 4.0000 mg | Freq: Four times a day (QID) | INTRAMUSCULAR | Status: DC | PRN

## 2013-12-21 MED ORDER — MORPHINE SULFATE 4 MG/ML IJ SOLN
4.0000 mg | Freq: Once | INTRAMUSCULAR | Status: AC
Start: 2013-12-21 — End: 2013-12-21
  Administered 2013-12-21: 4 mg via INTRAVENOUS
  Filled 2013-12-21: qty 1

## 2013-12-21 MED ORDER — HEPARIN SODIUM (PORCINE) 5000 UNIT/ML IJ SOLN
5000.0000 [IU] | Freq: Three times a day (TID) | INTRAMUSCULAR | Status: DC
Start: 2013-12-21 — End: 2013-12-25
  Administered 2013-12-21 – 2013-12-25 (×11): 5000 [IU] via SUBCUTANEOUS
  Filled 2013-12-21 (×14): qty 1

## 2013-12-21 MED ORDER — ADULT MULTIVITAMIN W/MINERALS CH
1.0000 | ORAL_TABLET | Freq: Every day | ORAL | Status: DC
  Administered 2013-12-22 – 2013-12-23 (×2): 1 via ORAL
  Filled 2013-12-21 (×2): qty 1

## 2013-12-21 MED ORDER — NITROGLYCERIN 0.4 MG SL SUBL
0.4000 mg | SUBLINGUAL_TABLET | SUBLINGUAL | Status: AC | PRN
  Administered 2013-12-21 – 2013-12-25 (×3): 0.4 mg via SUBLINGUAL
  Filled 2013-12-21: qty 25

## 2013-12-21 MED ORDER — PANTOPRAZOLE SODIUM 40 MG PO TBEC
40.0000 mg | DELAYED_RELEASE_TABLET | Freq: Every day | ORAL | Status: DC
  Administered 2013-12-22 – 2013-12-25 (×4): 40 mg via ORAL
  Filled 2013-12-21 (×2): qty 1

## 2013-12-21 MED ORDER — MORPHINE SULFATE 4 MG/ML IJ SOLN
4.0000 mg | Freq: Once | INTRAMUSCULAR | Status: AC
  Administered 2013-12-21: 4 mg via INTRAVENOUS
  Filled 2013-12-21: qty 1

## 2013-12-21 MED ORDER — SODIUM CHLORIDE 0.9 % IV SOLN
1500.0000 mg | Freq: Once | INTRAVENOUS | Status: AC
  Administered 2013-12-21: 1500 mg via INTRAVENOUS
  Filled 2013-12-21: qty 1500

## 2013-12-21 MED ORDER — INSULIN ASPART 100 UNIT/ML ~~LOC~~ SOLN
0.0000 [IU] | Freq: Three times a day (TID) | SUBCUTANEOUS | Status: DC
  Administered 2013-12-25: 1 [IU] via SUBCUTANEOUS

## 2013-12-21 MED ORDER — ACETAMINOPHEN 325 MG PO TABS
650.0000 mg | ORAL_TABLET | Freq: Four times a day (QID) | ORAL | Status: DC | PRN
  Administered 2013-12-22 – 2013-12-24 (×3): 650 mg via ORAL
  Filled 2013-12-21 (×2): qty 2

## 2013-12-21 MED ORDER — VANCOMYCIN HCL IN DEXTROSE 750-5 MG/150ML-% IV SOLN
750.0000 mg | INTRAVENOUS | Status: DC
  Filled 2013-12-21: qty 150

## 2013-12-21 MED ORDER — DEXTROSE 5 % IV SOLN
2.0000 g | Freq: Once | INTRAVENOUS | Status: AC
  Administered 2013-12-21: 2 g via INTRAVENOUS

## 2013-12-21 MED ORDER — ONDANSETRON HCL 4 MG PO TABS: 4.0000 mg | ORAL_TABLET | Freq: Four times a day (QID) | ORAL | Status: DC | PRN

## 2013-12-21 MED ORDER — OXYCODONE-ACETAMINOPHEN 5-325 MG PO TABS
2.0000 | ORAL_TABLET | Freq: Once | ORAL | Status: AC
  Administered 2013-12-21: 2 via ORAL
  Filled 2013-12-21: qty 2

## 2013-12-21 MED ORDER — DEXTROSE 5 % IV SOLN
2.0000 g | INTRAVENOUS | Status: DC
  Filled 2013-12-21: qty 2

## 2013-12-21 MED ORDER — DEXTROSE 5 % IV SOLN: 2.0000 g | INTRAVENOUS | Status: DC

## 2013-12-21 MED ORDER — ALBUTEROL SULFATE (2.5 MG/3ML) 0.083% IN NEBU: 2.5000 mg | INHALATION_SOLUTION | Freq: Four times a day (QID) | RESPIRATORY_TRACT | Status: DC | PRN

## 2013-12-21 MED ORDER — RISPERIDONE 0.25 MG PO TABS
0.2500 mg | ORAL_TABLET | Freq: Every day | ORAL | Status: DC
  Administered 2013-12-21 – 2013-12-24 (×4): 0.25 mg via ORAL
  Filled 2013-12-21 (×5): qty 1

## 2013-12-21 NOTE — ED Notes (Signed)
Pt is HD patient and last dialysis was on Saturday.  Pt is here with chest pain and sob.  Pt appears to clinch chest during triage.  Pt has defibrillator

## 2013-12-21 NOTE — ED Provider Notes (Signed)
Medical screening examination/treatment/procedure(s) were performed by non-physician practitioner and as supervising physician I was immediately available for consultation/collaboration.  EKG Interpretation   None        Threasa Beards, MD 12/21/13 1455

## 2013-12-21 NOTE — Progress Notes (Signed)
ANTIBIOTIC CONSULT NOTE - INITIAL  Pharmacy Consult:  Vancomycin / Cefepime Indication:  HCAP  No Known Allergies  Patient Measurements: Height: 5' 6.14" (168 cm) Weight: 161 lb 7 oz (73.228 kg) IBW/kg (Calculated) : 64.13  Vital Signs: Temp: 97.9 F (36.6 C) (02/24 0955) Temp src: Oral (02/24 0955) BP: 126/71 mmHg (02/24 1115) Pulse Rate: 80 (02/24 1115)  Labs:  Recent Labs  12/21/13 1015  WBC 13.9*  HGB 9.6*  PLT 204  CREATININE 7.14*   Estimated Creatinine Clearance: 11.6 ml/min (by C-G formula based on Cr of 7.14). No results found for this basename: VANCOTROUGH, VANCOPEAK, VANCORANDOM, GENTTROUGH, GENTPEAK, GENTRANDOM, TOBRATROUGH, TOBRAPEAK, TOBRARND, AMIKACINPEAK, AMIKACINTROU, AMIKACIN,  in the last 72 hours   Microbiology: No results found for this or any previous visit (from the past 720 hour(s)).  Medical History: Past Medical History  Diagnosis Date  . Hypertension   . Dyslipidemia   . Anemia   . Cellulitis   . Atrial fibrillation   . Enterobacter sepsis   . Schizophrenia   . Chronic kidney disease   . Hyperlipidemia      Assessment: 70 YOM presented with chest pain and SOB.  He has ESRD and his HD session was on Saturday per documentation.  Pharmacy consulted to manage vancomycin and cefepime for HCAP.  Baseline labs reviewed.  Vanc 2/24 >> Cefepime 2/24 >>   Goal of Therapy:  Vanc pre-HD level:  15-25 mcg/mL   Plan:  - Vanc 1500mg  IV x 1 now, then 750mg  IV q-HD TTS - Cefepime 2gm IV x 1 now, then 2mg  IV q-HD TTS - F/U HD tolerance/schedule, vanc pre-HD level as indicated    James Nicholson, PharmD, BCPS Pager:  [phone] 12/21/2013, 12:09 PM

## 2013-12-21 NOTE — H&P (Signed)
[facility] History and Physical  James Nicholson [id number] DOB: 1965/12/28 DOA: 12/21/2013  Referring physician:  PCP: [name], MD  Specialists:   Chief Complaint: SOB, cough   HPI: James Nicholson is a 47 y.o. male with PMH of Wegener's Dz  complicated with  Renal failure (H/o biopsy proven P-ANCA positive necrotizing glomerulonephritis) treated with cytoxan and steroids in 2010 in [place], Hx of V-Fib arrest, CHF s/p ICD implant, Hx of Schizophrenia presented with SOB, mild productive cough, chills from NH found to have HCAP: denies hemoptysis, no exertional chest pain,. he also reports some epigastric pain, no nausea, vomiting or diarrhea     Review of Systems: The patient denies anorexia, fever, weight loss,, vision loss, decreased hearing, hoarseness, chest pain, syncope, dyspnea on exertion, peripheral edema, balance deficits, hemoptysis, abdominal pain, melena, hematochezia, severe indigestion/heartburn, hematuria, incontinence, genital sores, muscle weakness, suspicious skin lesions, transient blindness, difficulty walking, depression, unusual weight change, abnormal bleeding, enlarged lymph nodes, angioedema, and breast masses.   Past Medical History  Diagnosis Date  . Hypertension   . Dyslipidemia   . Anemia   . Cellulitis   . Atrial fibrillation   . Enterobacter sepsis   . Schizophrenia   . Chronic kidney disease   . Hyperlipidemia    Past Surgical History  Procedure Laterality Date  . Cardiac defibrillator placement    . Pacemaker insertion    . Av fistula placement Right 02/10/2013    Procedure: ARTERIOVENOUS (AV) FISTULA CREATION;  Surgeon: Rosetta Posner, MD;  Location: [facility] OR;  Service: Vascular;  Laterality: Right;  Right forearm radial/cephalic arterovenous fistula.   . Ligation of competing branches of arteriovenous fistula Right 08/13/2013    Procedure: LIGATION OF COMPETING BRANCHES X5 OF ARTERIOVENOUS FISTULA- RIGHT ARM;  Surgeon: Serafina Mitchell, MD;  Location: [facility] OR;  Service: Vascular;  Laterality: Right;   Social History:  reports that he quit smoking about 5 years ago. He has never used smokeless tobacco. He reports that he does not drink alcohol or use illicit drugs. NH where does patient live--home, ALF, SNF? and with whom if at home? yes Can patient participate in ADLs?  No Known Allergies  Family History  Problem Relation Age of Onset  . CAD Mother     (be sure to complete)  Prior to Admission medications   Medication Sig Start Date End Date Taking? Authorizing Provider  calcium acetate (PHOSLO) 667 MG capsule Take 667 mg by mouth every evening. At 5 pm 02/11/13  Yes Belkys A Regalado, MD  insulin detemir (LEVEMIR) 100 UNIT/ML injection Inject 0.05 mLs (5 Units total) into the skin at bedtime. 02/11/13  Yes Belkys A Regalado, MD  Multiple Vitamin (MULTIVITAMIN WITH MINERALS) TABS tablet Take 1 tablet by mouth daily.   Yes Historical Provider, MD  omeprazole (PRILOSEC) 20 MG capsule Take 20 mg by mouth daily.   Yes Historical Provider, MD  risperiDONE (RISPERDAL) 0.25 MG tablet Take 0.25 mg by mouth at bedtime.   Yes Historical Provider, MD   Physical Exam: Filed Vitals:   12/21/13 1200  BP: 108/64  Pulse: 77  Temp:   Resp: 18     General:  alert  Eyes: eom-i  ENT: no oral ulcers  Neck: supple   Cardiovascular: S1, S2, rrrr  Respiratory: L side few rales   Abdomen: soft, nt, nd   Skin: no rash  Musculoskeletal: no LE edema  Psychiatric: no hallucinations   Neurologic: CN 2-12 intact, motor 5/5 BL symmetric  Labs on Admission:  Basic Metabolic Panel:  Recent Labs Lab 12/21/13 1015  NA 136*  K 3.6*  CL 92*  CO2 25  GLUCOSE 111*  BUN 55*  CREATININE 7.14*  CALCIUM 9.3   Liver Function Tests:  Recent Labs Lab 12/21/13 1015  AST 13  ALT 12  ALKPHOS 67  BILITOT 0.5  PROT 7.3  ALBUMIN 3.6   No results found for this basename: LIPASE, AMYLASE,  in the last 168 hours No  results found for this basename: AMMONIA,  in the last 168 hours CBC:  Recent Labs Lab 12/21/13 1015  WBC 13.9*  HGB 9.6*  HCT 27.2*  MCV 90.1  PLT 204   Cardiac Enzymes:  Recent Labs Lab 12/21/13 1015  TROPONINI <0.30    BNP (last 3 results) No results found for this basename: PROBNP,  in the last 8760 hours CBG: No results found for this basename: GLUCAP,  in the last 168 hours  Radiological Exams on Admission: Dg Chest 2 View  12/21/2013   CLINICAL DATA:  Chest pain and shortness of breath.  EXAM: CHEST  2 VIEW  COMPARISON:  PA and lateral chest 07/22/2013.  FINDINGS: The patient has bibasilar airspace disease, worse on the left. Lung volumes are lower than on the comparison study. Heart size is mildly enlarged. No pneumothorax or pleural fluid. Mild elevation of the right hemidiaphragm and AICD are again seen. Dialysis catheter present on the prior study has been removed.  IMPRESSION: Left worse than right basilar airspace disease could be due to atelectasis or pneumonia.   Electronically Signed   By: Inge Rise M.D.   On: 12/21/2013 10:55    EKG: Independently reviewed. NSR, rbbb, st depressions   Assessment/Plan Principal Problem:   HCAP (healthcare-associated pneumonia) Active Problems:   Hypertension   Automatic implantable cardioverter-defibrillator in situ   Wegener's disease, pulmonary   End stage renal disease  48 y.o. male with PMH of Wegener's Dz  complicated with  Renal failure (H/o biopsy proven P-ANCA positive necrotizing glomerulonephritis) treated with cytoxan and steroids in 2010 in [place], Hx of V-Fib arrest, CHF s/p ICD implant, Hx of Schizophrenia presented with SOB, mild productive cough, chills from NH found to have HCAP  1. 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 ESRD on HD T.T,S; called left message for HD;   3. Wegener's Dz off steroids; f/u CT chest   4. Anemia chronic AoCD; no s/s of acute bleeding -monitor Hg; epo per nephrologist   5. CHF  s/p ICD implant; clinically euvolemic; cont HD for volume control -Pt reported mild epigastric pain, initial trop neg; obtain serial trop, ecg   6. ? H/o DM; no recent Ha1c; cont ISS; checkl a1c;   Nephrology;  if consultant consulted, please document name and whether formally or informally consulted  Code Status: full (must indicate code status--if unknown or must be presumed, indicate so) Family Communication: d/w aptient, his brother at the bedside phone: [phone] [facility] (indicate person spoken with, if applicable, with phone number if by telephone) Disposition Plan: NH when clinically better  (indicate anticipated LOS)  Time spent: >35 minutes   Kinnie Feil [facility] Pager [phone]  If 7PM-7AM, please contact night-coverage [email or web address] Password [facility] 12/21/2013,  12:42 PM

## 2013-12-21 NOTE — ED Provider Notes (Signed)
CSN: [id number]     Arrival date & time 12/21/13  [id number] History   First MD Initiated Contact with Patient 12/21/13 [phone]     Chief Complaint  Patient presents with  . Chest Pain  . Shortness of Breath     (Consider location/radiation/quality/duration/timing/severity/associated sxs/prior Treatment) HPI Comments: Patient is a 48 yo M PMHx significant for HTN, Dyslipidemia, Anemia, A. Fib w/ defibrillator placement, Schizophrenia, CKD on HD TTS, cardiomyopathy presenting to the ED for Central Chest Pain with radiation to back with associated shortness of breath that began last evening. Patient rates his pain 10/10 with no alleviating or aggravating factors. Patient states he has had CP and SOB similar to this in the past year. Patient makes very little urine on his own. Last dialysis was on Saturday which he finished without difficulty. Patient lives at a nursing home. He was unable to remember the names of his nephrologist. His cardiologist is Dr. Lovena Le.  The history is provided by the patient and a relative. The history is limited by a language barrier. A language interpreter was used.    Past Medical History  Diagnosis Date  . Hypertension   . Dyslipidemia   . Anemia   . Cellulitis   . Atrial fibrillation   . Enterobacter sepsis   . Schizophrenia   . Chronic kidney disease   . Hyperlipidemia    Past Surgical History  Procedure Laterality Date  . Cardiac defibrillator placement    . Pacemaker insertion    . Av fistula placement Right 02/10/2013    Procedure: ARTERIOVENOUS (AV) FISTULA CREATION;  Surgeon: Rosetta Posner, MD;  Location: [facility] OR;  Service: Vascular;  Laterality: Right;  Right forearm radial/cephalic arterovenous fistula.   . Ligation of competing branches of arteriovenous fistula Right 08/13/2013    Procedure: LIGATION OF COMPETING BRANCHES X5 OF ARTERIOVENOUS FISTULA- RIGHT ARM;  Surgeon: Serafina Mitchell, MD;  Location: [facility] OR;  Service: Vascular;  Laterality: Right;    Family History  Problem Relation Age of Onset  . CAD Mother    History  Substance Use Topics  . Smoking status: Former Smoker    Quit date: 10/28/2008  . Smokeless tobacco: Never Used  . Alcohol Use: No    Review of Systems  Constitutional: Positive for chills and fatigue. Negative for fever.  Respiratory: Positive for cough and shortness of breath.   Cardiovascular: Positive for chest pain. Negative for leg swelling.  Gastrointestinal: Negative for nausea, vomiting and abdominal pain.  All other systems reviewed and are negative.      Allergies  Review of patient's allergies indicates no known allergies.  Home Medications   Current Outpatient Rx  Name  Route  Sig  Dispense  Refill  . calcium acetate (PHOSLO) 667 MG capsule   Oral   Take 667 mg by mouth every evening. At 5 pm         . insulin detemir (LEVEMIR) 100 UNIT/ML injection   Subcutaneous   Inject 0.05 mLs (5 Units total) into the skin at bedtime.   10 mL   0   . Multiple Vitamin (MULTIVITAMIN WITH MINERALS) TABS tablet   Oral   Take 1 tablet by mouth daily.         Marland Kitchen omeprazole (PRILOSEC) 20 MG capsule   Oral   Take 20 mg by mouth daily.         . risperiDONE (RISPERDAL) 0.25 MG tablet   Oral   Take 0.25 mg by mouth at  bedtime.          BP 117/65  Pulse 78  Temp(Src) 99.2 F (37.3 C) (Oral)  Resp 23  Ht 5' 6.14" (1.68 m)  Wt 161 lb 7 oz (73.228 kg)  BMI 25.95 kg/m2  SpO2 100% Physical Exam  Constitutional: He is oriented to person, place, and time. He appears well-developed and well-nourished. No distress.  HENT:  Head: Normocephalic and atraumatic.  Right Ear: External ear normal.  Left Ear: External ear normal.  Nose: Nose normal.  Mouth/Throat: Oropharynx is clear and moist. No oropharyngeal exudate.  Eyes: Conjunctivae are normal.  Neck: Neck supple.  Cardiovascular: Normal rate, regular rhythm, normal heart sounds and intact distal pulses.   Pulmonary/Chest: Effort  normal. No accessory muscle usage. Not tachypneic and not bradypneic. No respiratory distress. He has no decreased breath sounds. He has no wheezes. He has no rhonchi. He has rales. He exhibits no tenderness.  Abdominal: Soft. Bowel sounds are normal. He exhibits no distension. There is no tenderness. There is no rigidity, no rebound and no guarding.  Musculoskeletal: Normal range of motion. He exhibits no edema.  Neurological: He is alert and oriented to person, place, and time.  Skin: Skin is warm and dry. He is not diaphoretic.  AV fistula left forearm w/ good thrill    ED Course  Procedures (including critical care time) Medications  nitroGLYCERIN (NITROSTAT) SL tablet 0.4 mg (0.4 mg Sublingual Given 12/21/13 1117)  vancomycin (VANCOCIN) 1,500 mg in sodium chloride 0.9 % 500 mL IVPB (1,500 mg Intravenous New Bag/Given 12/21/13 1246)  ceFEPIme (MAXIPIME) 2 g in dextrose 5 % 50 mL IVPB (not administered)  HYDROmorphone (DILAUDID) injection 1 mg (not administered)  vancomycin (VANCOCIN) IVPB 750 mg/150 ml premix (not administered)  ceFEPIme (MAXIPIME) 2 g in dextrose 5 % 50 mL IVPB (not administered)  morphine 4 MG/ML injection 4 mg (4 mg Intravenous Given 12/21/13 1038)  morphine 4 MG/ML injection 4 mg (4 mg Intravenous Given 12/21/13 1213)    Labs Review Labs Reviewed  CBC - Abnormal; Notable for the following:    WBC 13.9 (*)    RBC 3.02 (*)    Hemoglobin 9.6 (*)    HCT 27.2 (*)    All other components within normal limits  COMPREHENSIVE METABOLIC PANEL - Abnormal; Notable for the following:    Sodium 136 (*)    Potassium 3.6 (*)    Chloride 92 (*)    Glucose, Bld 111 (*)    BUN 55 (*)    Creatinine, Ser 7.14 (*)    GFR calc non Af Amer 8 (*)    GFR calc Af Amer 9 (*)    All other components within normal limits  TROPONIN I   Imaging Review Dg Chest 2 View  12/21/2013   CLINICAL DATA:  Chest pain and shortness of breath.  EXAM: CHEST  2 VIEW  COMPARISON:  PA and lateral  chest 07/22/2013.  FINDINGS: The patient has bibasilar airspace disease, worse on the left. Lung volumes are lower than on the comparison study. Heart size is mildly enlarged. No pneumothorax or pleural fluid. Mild elevation of the right hemidiaphragm and AICD are again seen. Dialysis catheter present on the prior study has been removed.  IMPRESSION: Left worse than right basilar airspace disease could be due to atelectasis or pneumonia.   Electronically Signed   By: Inge Rise M.D.   On: 12/21/2013 10:55    EKG Interpretation   None  Date: 12/21/2013  Rate: 88  Rhythm: sinus rhythm  QRS Axis: left  Intervals: normal  ST/T Wave abnormalities: normal  Conduction Disutrbances:right bundle branch block  Narrative Interpretation:   Old EKG Reviewed: unchanged    MDM   Final diagnoses:  HCAP (healthcare-associated pneumonia)  Renal failure (ARF), acute on chronic  Anemia    Filed Vitals:   12/21/13 1242  BP: 117/65  Pulse: 78  Temp: 99.2 F (37.3 C)  Resp: 23     Afebrile, NAD, non-toxic appearing, AAOx4.  Patient has been diagnosed with HCAP via chest xray. Pt is a HD pt with multiple co morbidities, therefore I feel like he will require inpatient Abx. IV Cefepime and Vanc given for HCAP. Patient also noted to have Acute on chronic renal failure with anemia. I have reviewed nursing notes, vital signs, and all appropriate lab and imaging results for this patient. Patient will be admitted for further management and evaluation.      Harlow Mares, PA-C 12/21/13 1438

## 2013-12-21 NOTE — ED Notes (Signed)
Completed in triage.

## 2013-12-22 ENCOUNTER — Encounter (HOSPITAL_COMMUNITY): Payer: Self-pay | Admitting: Cardiology

## 2013-12-22 DIAGNOSIS — I428 Other cardiomyopathies: Secondary | ICD-10-CM | POA: Diagnosis not present

## 2013-12-22 DIAGNOSIS — J189 Pneumonia, unspecified organism: Secondary | ICD-10-CM | POA: Diagnosis not present

## 2013-12-22 DIAGNOSIS — N186 End stage renal disease: Secondary | ICD-10-CM | POA: Diagnosis not present

## 2013-12-22 DIAGNOSIS — D649 Anemia, unspecified: Secondary | ICD-10-CM | POA: Diagnosis not present

## 2013-12-22 DIAGNOSIS — N019 Rapidly progressive nephritic syndrome with unspecified morphologic changes: Secondary | ICD-10-CM | POA: Diagnosis not present

## 2013-12-22 DIAGNOSIS — F209 Schizophrenia, unspecified: Secondary | ICD-10-CM | POA: Diagnosis not present

## 2013-12-22 DIAGNOSIS — I214 Non-ST elevation (NSTEMI) myocardial infarction: Secondary | ICD-10-CM | POA: Diagnosis not present

## 2013-12-22 DIAGNOSIS — R799 Abnormal finding of blood chemistry, unspecified: Secondary | ICD-10-CM

## 2013-12-22 DIAGNOSIS — Z95810 Presence of automatic (implantable) cardiac defibrillator: Secondary | ICD-10-CM | POA: Diagnosis not present

## 2013-12-22 DIAGNOSIS — R778 Other specified abnormalities of plasma proteins: Secondary | ICD-10-CM | POA: Clinically undetermined

## 2013-12-22 DIAGNOSIS — R7989 Other specified abnormal findings of blood chemistry: Secondary | ICD-10-CM

## 2013-12-22 LAB — TROPONIN I
Troponin I: 0.42 ng/mL (ref <0.30)
Troponin I: 0.55 ng/mL (ref <0.30)
Troponin I: 0.63 ng/mL (ref <0.30)
Troponin I: <0.3 ng/mL (ref <0.30)

## 2013-12-22 LAB — HEPATITIS B SURFACE ANTIGEN: Hepatitis B Surface Ag: NEGATIVE

## 2013-12-22 LAB — CBC
HCT: 26.8 % — ABNORMAL LOW (ref 39.0–52.0)
Hemoglobin: 9.5 g/dL — ABNORMAL LOW (ref 13.0–17.0)
MCH: 32.2 pg (ref 26.0–34.0)
MCHC: 35.4 g/dL (ref 30.0–36.0)
MCV: 90.8 fL (ref 78.0–100.0)
Platelets: 201 10*3/uL (ref 150–400)
RBC: 2.95 MIL/uL — ABNORMAL LOW (ref 4.22–5.81)
RDW: 13.2 % (ref 11.5–15.5)
WBC: 17 10*3/uL — ABNORMAL HIGH (ref 4.0–10.5)

## 2013-12-22 LAB — HEMOGLOBIN A1C
Hgb A1c MFr Bld: 4.5 % (ref <5.7)
Mean Plasma Glucose: 82 mg/dL (ref <117)

## 2013-12-22 LAB — BASIC METABOLIC PANEL
BUN: 60 mg/dL — ABNORMAL HIGH (ref 6–23)
CO2: 22 mEq/L (ref 19–32)
Calcium: 9.1 mg/dL (ref 8.4–10.5)
Chloride: 96 mEq/L (ref 96–112)
Creatinine, Ser: 6.88 mg/dL — ABNORMAL HIGH (ref 0.50–1.35)
GFR calc Af Amer: 10 mL/min — ABNORMAL LOW (ref >90)
GFR calc non Af Amer: 9 mL/min — ABNORMAL LOW (ref >90)
Glucose, Bld: 90 mg/dL (ref 70–99)
Potassium: 3.9 mEq/L (ref 3.7–5.3)
Sodium: 136 mEq/L — ABNORMAL LOW (ref 137–147)

## 2013-12-22 LAB — GLUCOSE, CAPILLARY
Glucose-Capillary: 88 mg/dL (ref 70–99)
Glucose-Capillary: 96 mg/dL (ref 70–99)
Glucose-Capillary: 89 mg/dL (ref 70–99)
Glucose-Capillary: 122 mg/dL — ABNORMAL HIGH (ref 70–99)

## 2013-12-22 MED ORDER — CALCITRIOL 0.25 MCG PO CAPS
0.2500 ug | ORAL_CAPSULE | ORAL | Status: DC
  Filled 2013-12-22: qty 1

## 2013-12-22 MED ORDER — LIDOCAINE HCL (PF) 1 % IJ SOLN: 5.0000 mL | INTRAMUSCULAR | Status: DC | PRN

## 2013-12-22 MED ORDER — DARBEPOETIN ALFA-POLYSORBATE 25 MCG/0.42ML IJ SOLN
12.5000 ug | INTRAMUSCULAR | Status: DC
  Filled 2013-12-22: qty 0.42

## 2013-12-22 MED ORDER — HEPARIN SODIUM (PORCINE) 1000 UNIT/ML DIALYSIS
1000.0000 [IU] | INTRAMUSCULAR | Status: DC | PRN
  Filled 2013-12-22: qty 1

## 2013-12-22 MED ORDER — NITROGLYCERIN 0.4 MG SL SUBL
SUBLINGUAL_TABLET | SUBLINGUAL | Status: AC
  Filled 2013-12-22: qty 25

## 2013-12-22 MED ORDER — DARBEPOETIN ALFA-POLYSORBATE 25 MCG/0.42ML IJ SOLN
INTRAMUSCULAR | Status: AC
  Filled 2013-12-22: qty 0.42

## 2013-12-22 MED ORDER — NEPRO/CARBSTEADY PO LIQD: 237.0000 mL | ORAL | Status: DC | PRN

## 2013-12-22 MED ORDER — SODIUM CHLORIDE 0.9 % IV SOLN: 100.0000 mL | INTRAVENOUS | Status: DC | PRN

## 2013-12-22 MED ORDER — SODIUM CHLORIDE 0.9 % IV SOLN
62.5000 mg | INTRAVENOUS | Status: DC
  Administered 2013-12-22: 62.5 mg via INTRAVENOUS
  Filled 2013-12-22: qty 5

## 2013-12-22 MED ORDER — MORPHINE SULFATE 2 MG/ML IJ SOLN
2.0000 mg | INTRAMUSCULAR | Status: DC | PRN
  Administered 2013-12-22 – 2013-12-23 (×2): 2 mg via INTRAVENOUS
  Filled 2013-12-22 (×2): qty 1

## 2013-12-22 MED ORDER — LIDOCAINE-PRILOCAINE 2.5-2.5 % EX CREA: 1.0000 "application " | TOPICAL_CREAM | CUTANEOUS | Status: DC | PRN

## 2013-12-22 MED ORDER — ACETAMINOPHEN 325 MG PO TABS
ORAL_TABLET | ORAL | Status: AC
  Filled 2013-12-22: qty 2

## 2013-12-22 MED ORDER — DARBEPOETIN ALFA-POLYSORBATE 25 MCG/0.42ML IJ SOLN
12.5000 ug | INTRAMUSCULAR | Status: DC
  Administered 2013-12-22: 12.5 ug via INTRAVENOUS

## 2013-12-22 MED ORDER — PENTAFLUOROPROP-TETRAFLUOROETH EX AERO: 1.0000 "application " | INHALATION_SPRAY | CUTANEOUS | Status: DC | PRN

## 2013-12-22 MED ORDER — INFLUENZA VAC SPLIT QUAD 0.5 ML IM SUSP
0.5000 mL | INTRAMUSCULAR | Status: AC
  Administered 2013-12-22: 0.5 mL via INTRAMUSCULAR
  Filled 2013-12-22: qty 0.5

## 2013-12-22 MED ORDER — HEPARIN SODIUM (PORCINE) 1000 UNIT/ML DIALYSIS
2000.0000 [IU] | Freq: Once | INTRAMUSCULAR | Status: DC
  Filled 2013-12-22: qty 2

## 2013-12-22 MED ORDER — ALTEPLASE 2 MG IJ SOLR
2.0000 mg | Freq: Once | INTRAMUSCULAR | Status: DC | PRN
  Filled 2013-12-22: qty 2

## 2013-12-22 MED ORDER — ASPIRIN EC 81 MG PO TBEC
81.0000 mg | DELAYED_RELEASE_TABLET | Freq: Every day | ORAL | Status: DC
  Administered 2013-12-22 – 2013-12-25 (×4): 81 mg via ORAL
  Filled 2013-12-22 (×4): qty 1

## 2013-12-22 NOTE — Progress Notes (Signed)
CRITICAL VALUE ALERT  Critical value received:  Troponin I 0.63 Date of notification:  12/22/13  Time of notification:  [id number]  Critical value read back:yes  Nurse who received alert:  Caryl Pina  MD notified (1st page):  Mikhail  Time of first page:  18  MD notified (2nd page):  Time of second page:  Responding MD:  Ree Kida  Time MD responded:  [id number]

## 2013-12-22 NOTE — Progress Notes (Addendum)
[facility]                                                                              Patient Demographics  James Nicholson, is a 48 y.o. male, DOB - 1966-06-12, MD:[id number]  Admit date - 12/21/2013   Admitting Physician Velvet Bathe, MD  Outpatient Primary MD for the patient is [name],[name], MD  LOS - 1   Chief Complaint  Patient presents with  . Chest Pain  . Shortness of Breath        Assessment & Plan   Chest pain, elevated troponin -Patient had last troponin 0.63 -Will consult cardiology, continue to monitor his troponin levels, and EKG -Will start aspirin -Will obtain lipid panel, TSH level, magnesium and phosphorus levels  HCAP -Chest CT shows bilateral lower lobe opacities, possibly representing pneumonia -Continue coverage, vancomycin and cefepime -Patient continues to have low-grade fever, with leukocytosis  End stage renal disease requiring hemodialysis -Patient is a Tuesday Thursday Saturday dialysis patient -Nephrology consulted -Continue PhosLo  Wegener's disease -Currently not on steroids -Patient will need a followup CT of the chest  Anemia of chronic disease -Hemodynamically stable -Hemoglobin 9.5 -Will continue to monitor  CHF status post ICD implant -Patient currently euvolemic and does not appear to be in exacerbation -Continue hemodialysis for volume control  Questionable history of diabetes mellitus -Hemoglobin A1c pending -Continue insulin sliding scale with CBG monitoring  Code Status: Full  Family Communiation: Brother at bedside  Disposition Plan: Admitted  Time Spent in minutes   35 minutes  Procedures  None  Consults   Nephrology Cardiology  DVT Prophylaxis heparin  Lab Results  Component Value Date   PLT 201 12/22/2013    Medications  Scheduled Meds: . calcium acetate  667 mg Oral Q supper  . [START ON 12/23/2013] ceFEPime (MAXIPIME) IV  2 g Intravenous Q T,Th,Sa-HD  . heparin  5,000 Units  Subcutaneous 3 times per day  . insulin aspart  0-9 Units Subcutaneous TID WC  . multivitamin with minerals  1 tablet Oral Daily  . pantoprazole  40 mg Oral Daily  . risperiDONE  0.25 mg Oral QHS  . [START ON 12/23/2013] vancomycin  750 mg Intravenous Q T,Th,Sa-HD   Continuous Infusions:  PRN Meds:.acetaminophen, acetaminophen, albuterol, nitroGLYCERIN, ondansetron (ZOFRAN) IV, ondansetron  Antibiotics    Anti-infectives   Start     Dose/Rate Route Frequency Ordered Stop   12/23/13 1200  vancomycin (VANCOCIN) IVPB 750 mg/150 ml premix     750 mg 150 mL/hr over 60 Minutes Intravenous Every T-Th-Sa (Hemodialysis) 12/21/13 1424     12/23/13 1200  ceFEPIme (MAXIPIME) 2 g in dextrose 5 % 50 mL IVPB     2 g 100 mL/hr over 30 Minutes Intravenous Every T-Th-Sa (Hemodialysis) 12/21/13 1424     12/21/13 1900  vancomycin (VANCOCIN) IVPB 750 mg/150 ml premix  Status:  Discontinued     750 mg 150 mL/hr over 60 Minutes Intravenous Every T-Th-Sa (Hemodialysis) 12/21/13 1212 12/21/13 1424   12/21/13 1700  ceFEPIme (MAXIPIME) 2 g in dextrose 5 % 50 mL IVPB  Status:  Discontinued     2 g 100 mL/hr over 30 Minutes Intravenous Every T-Th-Sa (Hemodialysis)  12/21/13 1212 12/21/13 1424   12/21/13 1215  vancomycin (VANCOCIN) 1,500 mg in sodium chloride 0.9 % 500 mL IVPB     1,500 mg 250 mL/hr over 120 Minutes Intravenous  Once 12/21/13 1212 12/21/13 1504   12/21/13 1215  ceFEPIme (MAXIPIME) 2 g in dextrose 5 % 50 mL IVPB     2 g 100 mL/hr over 30 Minutes Intravenous  Once 12/21/13 1212 12/21/13 1645        Subjective:   Koren Llera seen and examined today.  Patient complains of chest pain.  Difficult to understand, interpretor utilized.  Chest pain has been present since Monday. It waxes and wanes. It is not associated with his cough, SOB, diaphoresis, dizziness. Pain is located in the mid sternal area and radiates to his back.   Objective:   Filed Vitals:   12/21/13 1820 12/21/13 2057  12/22/13 0513 12/22/13 0817  BP: 129/69 112/66 113/65 106/65  Pulse: 104 104 110 75  Temp: 98 F (36.7 C) 99.3 F (37.4 C) 100.2 F (37.9 C) 99.6 F (37.6 C)  TempSrc: Oral Oral Oral Oral  Resp: 24 22 20 18   Height:      Weight: 72.8 kg (160 lb 7.9 oz) 72.799 kg (160 lb 7.9 oz)    SpO2: 96% 94% 93% 100%    Wt Readings from Last 3 Encounters:  12/21/13 72.799 kg (160 lb 7.9 oz)  08/09/13 68.04 kg (150 lb)  08/09/13 68.04 kg (150 lb)     Intake/Output Summary (Last 24 hours) at 12/22/13 0819 Last data filed at 12/22/13 0515  Gross per 24 hour  Intake    360 ml  Output    100 ml  Net    260 ml    Exam  General: Well developed, well nourished, NAD, appears stated age  HEENT: NCAT, PERRLA, EOMI, Anicteic Sclera, mucous membranes moist.  Neck: Supple, no JVD, no masses  Cardiovascular: S1 S2 auscultated, no rubs, murmurs or gallops. Regular rate and rhythm. Pain with palpation of the chest wall.  Respiratory: Decreased breath sounds on the left, few rales  Abdomen: Soft, nontender, nondistended, + bowel sounds  Extremities: warm dry without cyanosis clubbing or edema  Neuro: AAOx3, cranial nerves grossly intact. Strength 5/5 in patient's upper and lower extremities bilaterally  Skin: Without rashes exudates or nodules  Psych: Normal affect and demeanor with intact judgement and insight   Data Review   Micro Results Recent Results (from the past 240 hour(s))  MRSA PCR SCREENING     Status: None   Collection Time    12/21/13  8:57 PM      Result Value Ref Range Status   MRSA by PCR NEGATIVE  NEGATIVE Final   Comment:            The GeneXpert MRSA Assay (FDA     approved for NASAL specimens     only), is one component of a     comprehensive MRSA colonization     surveillance program. It is not     intended to diagnose MRSA     infection nor to guide or     monitor treatment for     MRSA infections.    Radiology Reports Dg Chest 2 View  12/21/2013    CLINICAL DATA:  Chest pain and shortness of breath.  EXAM: CHEST  2 VIEW  COMPARISON:  PA and lateral chest 07/22/2013.  FINDINGS: The patient has bibasilar airspace disease, worse on the left. Lung volumes are  lower than on the comparison study. Heart size is mildly enlarged. No pneumothorax or pleural fluid. Mild elevation of the right hemidiaphragm and AICD are again seen. Dialysis catheter present on the prior study has been removed.  IMPRESSION: Left worse than right basilar airspace disease could be due to atelectasis or pneumonia.   Electronically Signed   By: Inge Rise M.D.   On: 12/21/2013 10:55   Ct Chest Wo Contrast  12/21/2013   CLINICAL DATA:  48 year old male with chest pain and shortness of breath. History of Wegener's granulomatosis.  EXAM: CT CHEST WITHOUT CONTRAST  TECHNIQUE: Multidetector CT imaging of the chest was performed following the standard protocol without IV contrast.  COMPARISON:  04/12/2013 chest CT. 12/21/2013 and prior chest radiographs  FINDINGS: Respiratory motion artifact in the lower lungs decreases sensitivity slightly limits evaluation.  Cardiomegaly and AICD again noted.  There is no evidence of pleural effusion.  A very small pericardial effusion versus pericardial thickening noted, increased since the prior study.  No enlarged lymph nodes are present.  Bilateral lower lobe opacities may represent atelectasis and/or airspace disease. .  No discrete nodules or mass identified.  The visualized upper abdomen is unremarkable.  No acute or suspicious bony abnormalities are present.  IMPRESSION: Bilateral lower lobe opacities, question atelectasis and/or airspace disease - possibly representing pneumonia.  Cardiomegaly with increasing very small pericardial effusion versus pericardial thickening. Pericarditis is not excluded.   Electronically Signed   By: Hassan Rowan M.D.   On: 12/21/2013 21:35    CBC  Recent Labs Lab 12/21/13 1015 12/22/13 0620  WBC 13.9* 17.0*    HGB 9.6* 9.5*  HCT 27.2* 26.8*  PLT 204 201  MCV 90.1 90.8  MCH 31.8 32.2  MCHC 35.3 35.4  RDW 13.4 13.2    Chemistries   Recent Labs Lab 12/21/13 1015 12/22/13 0620  NA 136* 136*  K 3.6* 3.9  CL 92* 96  CO2 25 22  GLUCOSE 111* 90  BUN 55* 60*  CREATININE 7.14* 6.88*  CALCIUM 9.3 9.1  AST 13  --   ALT 12  --   ALKPHOS 67  --   BILITOT 0.5  --    ------------------------------------------------------------------------------------------------------------------ estimated creatinine clearance is 12 ml/min (by C-G formula based on Cr of 6.88). ------------------------------------------------------------------------------------------------------------------ No results found for this basename: HGBA1C,  in the last 72 hours ------------------------------------------------------------------------------------------------------------------ No results found for this basename: CHOL, HDL, LDLCALC, TRIG, CHOLHDL, LDLDIRECT,  in the last 72 hours ------------------------------------------------------------------------------------------------------------------ No results found for this basename: TSH, T4TOTAL, FREET3, T3FREE, THYROIDAB,  in the last 72 hours ------------------------------------------------------------------------------------------------------------------ No results found for this basename: VITAMINB12, FOLATE, FERRITIN, TIBC, IRON, RETICCTPCT,  in the last 72 hours  Coagulation profile No results found for this basename: INR, PROTIME,  in the last 168 hours  No results found for this basename: DDIMER,  in the last 72 hours  Cardiac Enzymes  Recent Labs Lab 12/21/13 2010 12/22/13 12/22/13 0620  TROPONINI <0.30 <0.30 0.63*   ------------------------------------------------------------------------------------------------------------------ No components found with this basename: POCBNP,     Lyzette Reinhardt D.O. on 12/22/2013 at 8:19 AM  Between 7am to 7pm -  Pager - [phone]  After 7pm go to [email or web address] - password [email or web address]  And look for the night coverage person covering for me after hours  [facility] Office  [phone]

## 2013-12-22 NOTE — Consult Note (Signed)
Indication for Consultation:  Management of ESRD/hemodialysis; anemia, hypertension/volume and secondary hyperparathyroidism  HPI: James Nicholson is a 48 y.o. male admitted yesterday from SNF for SOB and cough. He recieves HD TTS @ [place] but missed yesterday d/t coming to hospital. Last HD 2/21. His brother is present and give history. Brother reports got a call from [facility] Monday night that pt wasn't feeling well. He visited pt Tuesday morning and thought he looked sick so he brought him into ED for eval.  Past Medical History  Diagnosis Date  . Hypertension   . Dyslipidemia   . Anemia   . Cellulitis   . Atrial fibrillation   . Enterobacter sepsis   . Schizophrenia   . Chronic kidney disease   . Hyperlipidemia   . Cardiomyopathy 2010    Unclear Etiology: Last Echo 06/2009: EF 40-45%, severe Lateral & apical Hypokinesis (? CAD) ; Grade 2 DDysfxn. Mild conc LVH.   . S/P ICD (internal cardiac defibrillator) procedure 2010    VT Arrest (in [place])   Past Surgical History  Procedure Laterality Date  . Cardiac defibrillator placement  2010    [place]  . Av fistula placement Right 02/10/2013    Procedure: ARTERIOVENOUS (AV) FISTULA CREATION;  Surgeon: Rosetta Posner, MD;  Location: [facility] OR;  Service: Vascular;  Laterality: Right;  Right forearm radial/cephalic arterovenous fistula.   . Ligation of competing branches of arteriovenous fistula Right 08/13/2013    Procedure: LIGATION OF COMPETING BRANCHES X5 OF ARTERIOVENOUS FISTULA- RIGHT ARM;  Surgeon: Serafina Mitchell, MD;  Location: [place];  Service: Vascular;  Laterality: Right;  . Cardiac catheterization  2010    [place]: In setting of VT arrest. Per brother's report, nonobstructive with no intervention   Family History  Problem Relation Age of Onset  . CAD Mother    Social History:  reports that he quit smoking about 5 years ago. He has never used smokeless tobacco. He reports that he does not drink alcohol or use illicit drugs. No Known  Allergies Prior to Admission medications   Medication Sig Start Date End Date Taking? Authorizing Provider  calcium acetate (PHOSLO) 667 MG capsule Take 667 mg by mouth every evening. At 5 pm 02/11/13  Yes Belkys A Regalado, MD  insulin detemir (LEVEMIR) 100 UNIT/ML injection Inject 0.05 mLs (5 Units total) into the skin at bedtime. 02/11/13  Yes Belkys A Regalado, MD  Multiple Vitamin (MULTIVITAMIN WITH MINERALS) TABS tablet Take 1 tablet by mouth daily.   Yes Historical Provider, MD  omeprazole (PRILOSEC) 20 MG capsule Take 20 mg by mouth daily.   Yes Historical Provider, MD  risperiDONE (RISPERDAL) 0.25 MG tablet Take 0.25 mg by mouth at bedtime.   Yes Historical Provider, MD   Current Facility-Administered Medications  Medication Dose Route Frequency Provider Last Rate Last Dose  . 0.9 %  sodium chloride infusion  100 mL Intravenous PRN Marlena Clipper, NP      . 0.9 %  sodium chloride infusion  100 mL Intravenous PRN Marlena Clipper, NP      . acetaminophen (TYLENOL) tablet 650 mg  650 mg Oral Q6H PRN Kinnie Feil, MD       Or  . acetaminophen (TYLENOL) suppository 650 mg  650 mg Rectal Q6H PRN Kinnie Feil, MD      . albuterol (PROVENTIL) (2.5 MG/3ML) 0.083% nebulizer solution 2.5 mg  2.5 mg Nebulization Q6H PRN Kinnie Feil, MD      . alteplase (CATHFLO  ACTIVASE) injection 2 mg  2 mg Intracatheter Once PRN Marlena Clipper, NP      . aspirin EC tablet 81 mg  81 mg Oral Daily Maryann Mikhail, DO      . calcitRIOL (ROCALTROL) capsule 0.25 mcg  0.25 mcg Oral Q T,Th,Sa-HD Marlena Clipper, NP      . calcium acetate (PHOSLO) capsule 667 mg  667 mg Oral Q supper Kinnie Feil, MD      . Derrill Memo ON 12/23/2013] ceFEPIme (MAXIPIME) 2 g in dextrose 5 % 50 mL IVPB  2 g Intravenous Q T,Th,Sa-HD Thuy Dien Dang, RPH      . darbepoetin (ARANESP) injection 12.5 mcg  12.5 mcg Intravenous Q7 days Marlena Clipper, NP      . feeding supplement (NEPRO CARB STEADY) liquid 237 mL   237 mL Oral PRN Marlena Clipper, NP      . ferric gluconate (NULECIT) 62.5 mg in sodium chloride 0.9 % 100 mL IVPB  62.5 mg Intravenous Weekly Marlena Clipper, NP      . heparin injection 1,000 Units  1,000 Units Dialysis PRN Marlena Clipper, NP      . heparin injection 2,000 Units  2,000 Units Dialysis Once in dialysis Marlena Clipper, NP      . heparin injection 5,000 Units  5,000 Units Subcutaneous 3 times per day Kinnie Feil, MD   5,000 Units at 12/22/13 0542  . insulin aspart (novoLOG) injection 0-9 Units  0-9 Units Subcutaneous TID WC Kinnie Feil, MD      . lidocaine (PF) (XYLOCAINE) 1 % injection 5 mL  5 mL Intradermal PRN Marlena Clipper, NP      . lidocaine-prilocaine (EMLA) cream 1 application  1 application Topical PRN Marlena Clipper, NP      . morphine 2 MG/ML injection 2 mg  2 mg Intravenous Q4H PRN Maryann Mikhail, DO   2 mg at 12/22/13 [id number]  . multivitamin with minerals tablet 1 tablet  1 tablet Oral Daily Kinnie Feil, MD   1 tablet at 12/22/13 0935  . nitroGLYCERIN (NITROSTAT) SL tablet 0.4 mg  0.4 mg Sublingual Q5 Min x 3 PRN Jennifer L Piepenbrink, PA-C   0.4 mg at 12/21/13 1117  . ondansetron (ZOFRAN) tablet 4 mg  4 mg Oral Q6H PRN Kinnie Feil, MD       Or  . ondansetron (ZOFRAN) injection 4 mg  4 mg Intravenous Q6H PRN Kinnie Feil, MD      . pantoprazole (PROTONIX) EC tablet 40 mg  40 mg Oral Daily Kinnie Feil, MD   40 mg at 12/22/13 0935  . pentafluoroprop-tetrafluoroeth (GEBAUERS) aerosol 1 application  1 application Topical PRN Marlena Clipper, NP      . risperiDONE (RISPERDAL) tablet 0.25 mg  0.25 mg Oral QHS Kinnie Feil, MD   0.25 mg at 12/21/13 2051  . [START ON 12/23/2013] vancomycin (VANCOCIN) IVPB 750 mg/150 ml premix  750 mg Intravenous Q T,Th,Sa-HD Saundra Shelling, [facility]       Labs: Basic Metabolic Panel:  Recent Labs Lab 12/21/13 1015 12/22/13 0620  NA 136* 136*  K 3.6* 3.9  CL 92* 96  CO2 25 22   GLUCOSE 111* 90  BUN 55* 60*  CREATININE 7.14* 6.88*  CALCIUM 9.3 9.1   Liver Function Tests:  Recent Labs Lab 12/21/13 1015  AST 13  ALT 12  ALKPHOS 67  BILITOT 0.5  PROT 7.3  ALBUMIN 3.6   No results found for this basename: LIPASE, AMYLASE,  in the last 168 hours No results found for this basename: AMMONIA,  in the last 168 hours CBC:  Recent Labs Lab 12/21/13 1015 12/22/13 0620  WBC 13.9* 17.0*  HGB 9.6* 9.5*  HCT 27.2* 26.8*  MCV 90.1 90.8  PLT 204 201   Cardiac Enzymes:  Recent Labs Lab 12/21/13 1015 12/21/13 2010 12/22/13 12/22/13 0620  TROPONINI <0.30 <0.30 <0.30 0.63*   CBG:  Recent Labs Lab 12/21/13 2241 12/22/13 0756 12/22/13 1212  GLUCAP 119* 88 96   Iron Studies: No results found for this basename: IRON, TIBC, TRANSFERRIN, FERRITIN,  in the last 72 hours Studies/Results: Dg Chest 2 View  12/21/2013   CLINICAL DATA:  Chest pain and shortness of breath.  EXAM: CHEST  2 VIEW  COMPARISON:  PA and lateral chest 07/22/2013.  FINDINGS: The patient has bibasilar airspace disease, worse on the left. Lung volumes are lower than on the comparison study. Heart size is mildly enlarged. No pneumothorax or pleural fluid. Mild elevation of the right hemidiaphragm and AICD are again seen. Dialysis catheter present on the prior study has been removed.  IMPRESSION: Left worse than right basilar airspace disease could be due to atelectasis or pneumonia.   Electronically Signed   By: Inge Rise M.D.   On: 12/21/2013 10:55   Ct Chest Wo Contrast  12/21/2013   CLINICAL DATA:  48 year old male with chest pain and shortness of breath. History of Wegener's granulomatosis.  EXAM: CT CHEST WITHOUT CONTRAST  TECHNIQUE: Multidetector CT imaging of the chest was performed following the standard protocol without IV contrast.  COMPARISON:  04/12/2013 chest CT. 12/21/2013 and prior chest radiographs  FINDINGS: Respiratory motion artifact in the lower lungs decreases  sensitivity slightly limits evaluation.  Cardiomegaly and AICD again noted.  There is no evidence of pleural effusion.  A very small pericardial effusion versus pericardial thickening noted, increased since the prior study.  No enlarged lymph nodes are present.  Bilateral lower lobe opacities may represent atelectasis and/or airspace disease. .  No discrete nodules or mass identified.  The visualized upper abdomen is unremarkable.  No acute or suspicious bony abnormalities are present.  IMPRESSION: Bilateral lower lobe opacities, question atelectasis and/or airspace disease - possibly representing pneumonia.  Cardiomegaly with increasing very small pericardial effusion versus pericardial thickening. Pericarditis is not excluded.   Electronically Signed   By: Hassan Rowan M.D.   On: 12/21/2013 21:35    ROS: Negative except for SOB and dry cough starting Monday and worsened overnight also reports feeling slightly weaker yesterday.. Denies fever and chills Denies sick contacts. Reports feeling much better today.  Physical Exam: Filed Vitals:   12/21/13 1820 12/21/13 2057 12/22/13 0513 12/22/13 0817  BP: 129/69 112/66 113/65 106/65  Pulse: 104 104 110 75  Temp: 98 F (36.7 C) 99.3 F (37.4 C) 100.2 F (37.9 C) 99.6 F (37.6 C)  TempSrc: Oral Oral Oral Oral  Resp: 24 22 20 18   Height:      Weight: 72.8 kg (160 lb 7.9 oz) 72.799 kg (160 lb 7.9 oz)    SpO2: 96% 94% 93% 100%     General: Well developed, well nourished, in no acute distress. Head: Normocephalic, atraumatic, sclera non-icteric, mucus membranes are moist Neck: Supple. JVD not elevated. Lungs: rales bilat bases. Shallow resp. Breathing is unlabored. Heart: Tachy with S1 S2. No murmurs, rubs, or gallops appreciated. Abdomen: Soft, non-tender, non-distended with normoactive bowel  sounds. No rebound/guarding. No obvious abdominal masses. M-S:  Strength and tone appear normal for age. Lower extremities:without edema or ischemic changes, no  open wounds  Neuro: Alert and oriented X 3. Moves all extremities spontaneously. Psych:  Responds to questions appropriately with a normal affect. Dialysis Access: R AVF +bruit/thrill  Dialysis Orders: TTS @[facility]  4 hr    68kgs  2K/2.25Ca  180  400/800  [id number] RAVF calictriol [id number] mcg/HD Epogen 1000U IV/HD  Venofer 50 qweek   Assessment/Plan: 1.  PNA- workup per primary. afebrile. WBC 17. antibx per pharmacy. Chest xray/CT- possible PNA 2. CHF- elevated troponin (0.63)- cont to cycle, cardiology consulted 3.  ESRD -  TTS@ [facility]- missed yesterday, pending today. K+ 3.9 4.  Hypertension/volume  -106/65. No BP meds 5.  Anemia  -hgb 9.5, cont esa and iron (last tsat- 36) watch CBC 6.  Metabolic bone disease -  Ca+ 9.1  Phos 3.3 phoslo with  Meals. Cont calcitriol 7.  Nutrition - alb 3.6 renal diet. multivit  Shelle Iron, NP [employer] [phone] 12/22/2013, 12:42 PM   I have seen and examined patient, discussed with PA and agree with assessment and plan as outlined above with additions as indicated.  ESRD patient with schizophrenia, lives in a SNF. Has ESRD from Wegener's.  Also hx Vfib w ICD and EF in 40-50% range.  Admitted for cough, ^WBC and bibasilar PNA confirmed by CT.  For HD today.  No signs of vol excess, lytes in range.  Will follow.  Kelly Splinter MD pager [phone]    cell [phone] 12/22/2013, 2:30 PM

## 2013-12-22 NOTE — Consult Note (Signed)
CARDIOLOGY CONSULTATION NOTE.  NAMEPhilippos Roti   MRN: [id number] DOB:  1966-04-19   ADMIT DATE: 12/21/2013  Reason for Consult: Positive Tropon  Requesting Physician: Cristal Ford  Primary Cardiologist: Cristopher Peru, MD  HPI: This is a 48 y.o. male with a past medical history significant for Wegener's, ESRD on HD, h/o VF arrest (following Cytoxan therapy for Wegener's) & Cardiomyopathy s/p AICD as well as Schizophrenia --> admitted from SNF with SOB, mild productive cough & chills c/w HCAP.  + epigastric pain without N/V/D. He is a [place] native, who does not speak any [place]. He is accompanied by his brother who speaks relatively good [place]. Patient is relatively poor story and also per his brothers report. He sometimes gets confused and is unable to really easily described symptoms. He currently lives in a nursing facility, but his brother is available. He basically describes having discomfort in the epigastric region as well as in the back that began Monday. It is worse with lying down and improves with sitting up. It is also associated with difficulty taking a deep breath. He describes a discomfort in his chest as being a sharp pressure. I asked him to cough, he did note that it was made worse. He doesn't describe PND type symptoms but may be possible orthopnea symptoms. He denies significant coughing. He does note feeling hot and sweaty on occasion as well as chills.  He denies any irregular or rapid heartbeats. He denies any syncope or near syncope symptoms. No TIA or amaurosis fugax symptoms. No recent hemoptysis, emesis, melena or hematochezia. He currently feels better after arriving in his room to the emergency room.  Troponin levels checked - 4th set + @ 0.63.    PMHx:  CARDIAC HISTORY: H/o VF --> s/p AICD placement; cardiac catheterization did not show obstructive CAD  Past Medical History  Diagnosis Date  . Hypertension   . Dyslipidemia   . Anemia   .  Cellulitis   . Atrial fibrillation   . Enterobacter sepsis   . Schizophrenia   . Chronic kidney disease   . Hyperlipidemia   . Cardiomyopathy 2010    Unclear Etiology: Last Echo 06/2009: EF 40-45%, severe Lateral & apical Hypokinesis (? CAD) ; Grade 2 DDysfxn. Mild conc LVH.   . S/P ICD (internal cardiac defibrillator) procedure 2010    VT Arrest (in [place])   Past Surgical History  Procedure Laterality Date  . Cardiac defibrillator placement  2010    [place]  . Av fistula placement Right 02/10/2013    Procedure: ARTERIOVENOUS (AV) FISTULA CREATION;  Surgeon: Rosetta Posner, MD;  Location: [facility] OR;  Service: Vascular;  Laterality: Right;  Right forearm radial/cephalic arterovenous fistula.   . Ligation of competing branches of arteriovenous fistula Right 08/13/2013    Procedure: LIGATION OF COMPETING BRANCHES X5 OF ARTERIOVENOUS FISTULA- RIGHT ARM;  Surgeon: Serafina Mitchell, MD;  Location: [facility];  Service: Vascular;  Laterality: Right;  . Cardiac catheterization  2010    [place]: In setting of VT arrest. Per brother's report, nonobstructive with no intervention    FAMHx: Family History  Problem Relation Age of Onset  . CAD Mother    SOCHx:  reports that he quit smoking about 5 years ago. He has never used smokeless tobacco. He reports that he does not drink alcohol or use illicit drugs.  ALLERGIES: No Known Allergies  ROS: A comprehensive review of systems was negative except for: Constitutional: positive for chills, fatigue, malaise and  sweats Respiratory: positive for pleurisy/chest pain and Dyspnea, but no real cough Behavioral/Psych: positive for Somewhat confused, and unable to care for himself  HOME MEDICATIONS: Prescriptions prior to admission  Medication Sig Dispense Refill  . calcium acetate (PHOSLO) 667 MG capsule Take 667 mg by mouth every evening. At 5 pm      . insulin detemir (LEVEMIR) 100 UNIT/ML injection Inject 0.05 mLs (5 Units total) into the skin at bedtime.   10 mL  0  . Multiple Vitamin (MULTIVITAMIN WITH MINERALS) TABS tablet Take 1 tablet by mouth daily.      Marland Kitchen omeprazole (PRILOSEC) 20 MG capsule Take 20 mg by mouth daily.      . risperiDONE (RISPERDAL) 0.25 MG tablet Take 0.25 mg by mouth at bedtime.        HOSPITAL MEDICATIONS: . aspirin EC  81 mg Oral Daily  . calcium acetate  667 mg Oral Q supper  . [START ON 12/23/2013] ceFEPime (MAXIPIME) IV  2 g Intravenous Q T,Th,Sa-HD  . heparin  5,000 Units Subcutaneous 3 times per day  . insulin aspart  0-9 Units Subcutaneous TID WC  . multivitamin with minerals  1 tablet Oral Daily  . pantoprazole  40 mg Oral Daily  . risperiDONE  0.25 mg Oral QHS  . [START ON 12/23/2013] vancomycin  750 mg Intravenous Q T,Th,Sa-HD  PRN: acetaminophen, acetaminophen, albuterol, morphine injection, nitroGLYCERIN, ondansetron (ZOFRAN) IV, ondansetron  VITALS: Blood pressure 106/65, pulse 75, temperature 99.6 F (37.6 C), temperature source Oral, resp. rate 18, height 5' 6.14" (1.68 m), weight 160 lb 7.9 oz (72.799 kg), SpO2 100.00%. PHYSICAL EXAM: General appearance: alert, appears stated age, no distress, slowed mentation and Well-nourished. He seems to answer his brothers questions with quick answers, but his brother is not sure he fully understands the questions. Neck: no adenopathy, no carotid bruit, no JVD and supple, symmetrical, trachea midline Lungs: Bibasilar, left greater than right crackles/rales; no wheezes or rhonchi. Nonlabored. Heart: normal apical impulse and Tachycardic with a regular rhythm. Split S2. Cannot exclude pericardial friction rub. Of note exam is quite difficult as he has significant chest hair and a dynamic precordium Abdomen: Soft, nondistended. Tenderness in the epigastric area as well as right upper quadrant. -- It is below the rib cage. Extremities: extremities normal, atraumatic, no cyanosis or edema Pulses: 2+ and symmetric Skin: Skin color, texture, turgor normal. No rashes or  lesions Neurologic: Grossly normal; CN 2-12 grossly intact  LABS: Results for orders placed during the hospital encounter of 12/21/13 (from the past 24 hour(s))  TROPONIN I     Status: None   Collection Time    12/21/13  8:10 PM      Result Value Ref Range   Troponin I <0.30  <0.30 ng/mL  MRSA PCR SCREENING     Status: None   Collection Time    12/21/13  8:57 PM      Result Value Ref Range   MRSA by PCR NEGATIVE  NEGATIVE  GLUCOSE, CAPILLARY     Status: Abnormal   Collection Time    12/21/13 10:41 PM      Result Value Ref Range   Glucose-Capillary 119 (*) 70 - 99 mg/dL  TROPONIN I     Status: None   Collection Time    12/22/13 12:00 AM      Result Value Ref Range   Troponin I <0.30  <0.30 ng/mL  TROPONIN I     Status: Abnormal   Collection Time  12/22/13  6:20 AM      Result Value Ref Range   Troponin I 0.63 (*) <0.30 ng/mL  CBC     Status: Abnormal   Collection Time    12/22/13  6:20 AM      Result Value Ref Range   WBC 17.0 (*) 4.0 - 10.5 K/uL   RBC 2.95 (*) 4.22 - 5.81 MIL/uL   Hemoglobin 9.5 (*) 13.0 - 17.0 g/dL   HCT 26.8 (*) 39.0 - 52.0 %   MCV 90.8  78.0 - 100.0 fL   MCH 32.2  26.0 - 34.0 pg   MCHC 35.4  30.0 - 36.0 g/dL   RDW 13.2  11.5 - 15.5 %   Platelets 201  150 - 400 K/uL  BASIC METABOLIC PANEL     Status: Abnormal   Collection Time    12/22/13  6:20 AM      Result Value Ref Range   Sodium 136 (*) 137 - 147 mEq/L   Potassium 3.9  3.7 - 5.3 mEq/L   Chloride 96  96 - 112 mEq/L   CO2 22  19 - 32 mEq/L   Glucose, Bld 90  70 - 99 mg/dL   BUN 60 (*) 6 - 23 mg/dL   Creatinine, Ser 6.88 (*) 0.50 - 1.35 mg/dL   Calcium 9.1  8.4 - 10.5 mg/dL   GFR calc non Af Amer 9 (*) >90 mL/min   GFR calc Af Amer 10 (*) >90 mL/min  GLUCOSE, CAPILLARY     Status: None   Collection Time    12/22/13  7:56 AM      Result Value Ref Range   Glucose-Capillary 88  70 - 99 mg/dL   ECG from 2/25  0950 - NSR with RBBB, 88 bpm.  (I think 0937 ECG had lead placement issues  --> 1 lead without waveform & others very unusual). Personally reviewed.  Tele: mostly SR-Stachy 88-120; but intermittently appears to be potential Aflutter with slow response (will review with EP).  IMAGING: Dg Chest 2 View  12/21/2013   CLINICAL DATA:  Chest pain and shortness of breath.  EXAM: CHEST  2 VIEW  COMPARISON:  PA and lateral chest 07/22/2013.  FINDINGS: The patient has bibasilar airspace disease, worse on the left. Lung volumes are lower than on the comparison study. Heart size is mildly enlarged. No pneumothorax or pleural fluid. Mild elevation of the right hemidiaphragm and AICD are again seen. Dialysis catheter present on the prior study has been removed.  IMPRESSION: Left worse than right basilar airspace disease could be due to atelectasis or pneumonia.   Electronically Signed   By: Inge Rise M.D.   On: 12/21/2013 10:55   Ct Chest Wo Contrast  12/21/2013   CLINICAL DATA:  48 year old male with chest pain and shortness of breath. History of Wegener's granulomatosis.  EXAM: CT CHEST WITHOUT CONTRAST  TECHNIQUE: Multidetector CT imaging of the chest was performed following the standard protocol without IV contrast.  COMPARISON:  04/12/2013 chest CT. 12/21/2013 and prior chest radiographs  FINDINGS: Respiratory motion artifact in the lower lungs decreases sensitivity slightly limits evaluation.  Cardiomegaly and AICD again noted.  There is no evidence of pleural effusion.  A very small pericardial effusion versus pericardial thickening noted, increased since the prior study.  No enlarged lymph nodes are present.  Bilateral lower lobe opacities may represent atelectasis and/or airspace disease. .  No discrete nodules or mass identified.  The visualized upper abdomen is unremarkable.  No acute or suspicious bony abnormalities are present.  IMPRESSION: Bilateral lower lobe opacities, question atelectasis and/or airspace disease - possibly representing pneumonia.  Cardiomegaly with  increasing very small pericardial effusion versus pericardial thickening. Pericarditis is not excluded.   Electronically Signed   By: Hassan Rowan M.D.   On: 12/21/2013 21:35   IMPRESSION: Principal Problem:   HCAP (healthcare-associated pneumonia) Active Problems:   Hypertension   Automatic implantable cardioverter-defibrillator in situ   End stage renal disease   Troponin level elevated   Wegener's disease, pulmonary  This is a very difficult situation to figure out exactly what the right course of action is. He has discomfort in his chest began Monday, he had 3 negative troponins with a fourth (18 hours later) being positive despite not having any significant discomfort while in the hospital. He has been somewhat tachycardic with what appears to be an occasionally irregular rhythm as well as some telemetry strips showing possible atrial flutter (although this is difficult to determine the setting of right branch block). Be chest CT shows a suggestion of a possible pericardial effusion, and in the setting of a known autoimmune disease, we cannot exclude the possibility that his symptoms are related to pericarditis, which is a porta by the positional nature of his discomfort. At this point, I would prefer not to anticoagulate in the absence of symptoms.  Differential diagnosis: True non-STEMI, type II MI with pneumonia in the setting of known cardiomyopathy and renal failure, pericarditis  RECOMMENDATION:  Continue to cycle cardiac enzymes to follow trend. If he continues to elevate and then declining in a more MI trajectory, I would be more in favor of initiating anticoagulation and consider cardiac catheterization once he is more stable from a infectious/pneumonia standpoint. However, if this trend is not followed, my preference would be to consider noninvasive evaluation with stress test.  I have ordered a 2-D echocardiogram to evaluate his EF, as well as for possible pericardial effusion that  would corroborate pericarditis.  If there is indeed a suggestion of possible pericarditis, would possibly consider colchicine in addition to NSAIDs for anti-inflammatory effect, however with this being a possible autoimmune related condition, prednisone may also be appropriate. Would consider discussion with nephrology or rheumatology for their input.  He is currently tachycardic, in the absence of discomfort or arrhythmia. This is probably related to ongoing inflammatory condition/infection. He does not seem dehydrated, and is asymptomatic with mild tachycardia. For now I would not treat unless he is symptomatic. He will perhaps require beta blocker therapy in the future if his blood pressure would tolerate.  We will continue to follow along with additional suggestions.  Time Spent Directly with Patient: 60 minutes  Grier Vu W, M.D., M.S. Interventional Cardiologist  [place] Pager # [phone] 12/22/2013

## 2013-12-22 NOTE — Procedures (Signed)
I was present at this dialysis session, have reviewed the session itself and made  appropriate changes  Kelly Splinter MD (pgr) [phone]    [phone] 12/22/2013, 2:24 PM

## 2013-12-22 NOTE — Clinical Documentation Improvement (Signed)
Possible Clinical Conditions?  Chronic Systolic Congestive Heart Failure Chronic Diastolic Congestive Heart Failure Chronic Systolic & Diastolic Congestive Heart Failure Acute Systolic Congestive Heart Failure Acute Diastolic Congestive Heart Failure Acute Systolic & Diastolic Congestive Heart Failure Acute on Chronic Systolic Congestive Heart Failure Acute on Chronic Diastolic Congestive Heart Failure Acute on Chronic Systolic & Diastolic Congestive Heart Failure Other Condition Cannot Clinically Determine   Risk Factors: Patient with a history of CHF per 02/24 progress notes.  Thank You, Theron Arista, Clinical Documentation Specialist:  [phone]  [facility] [employer] [facility]

## 2013-12-23 DIAGNOSIS — I10 Essential (primary) hypertension: Secondary | ICD-10-CM | POA: Diagnosis not present

## 2013-12-23 DIAGNOSIS — I059 Rheumatic mitral valve disease, unspecified: Secondary | ICD-10-CM

## 2013-12-23 DIAGNOSIS — J189 Pneumonia, unspecified organism: Secondary | ICD-10-CM | POA: Diagnosis not present

## 2013-12-23 DIAGNOSIS — R799 Abnormal finding of blood chemistry, unspecified: Secondary | ICD-10-CM | POA: Diagnosis not present

## 2013-12-23 DIAGNOSIS — F209 Schizophrenia, unspecified: Secondary | ICD-10-CM | POA: Diagnosis not present

## 2013-12-23 DIAGNOSIS — I309 Acute pericarditis, unspecified: Secondary | ICD-10-CM

## 2013-12-23 DIAGNOSIS — Z95810 Presence of automatic (implantable) cardiac defibrillator: Secondary | ICD-10-CM | POA: Diagnosis not present

## 2013-12-23 DIAGNOSIS — N186 End stage renal disease: Secondary | ICD-10-CM | POA: Diagnosis not present

## 2013-12-23 DIAGNOSIS — I428 Other cardiomyopathies: Secondary | ICD-10-CM | POA: Diagnosis not present

## 2013-12-23 DIAGNOSIS — I214 Non-ST elevation (NSTEMI) myocardial infarction: Secondary | ICD-10-CM

## 2013-12-23 DIAGNOSIS — D649 Anemia, unspecified: Secondary | ICD-10-CM | POA: Diagnosis not present

## 2013-12-23 LAB — BASIC METABOLIC PANEL
BUN: 26 mg/dL — ABNORMAL HIGH (ref 6–23)
CO2: 28 mEq/L (ref 19–32)
Calcium: 8.6 mg/dL (ref 8.4–10.5)
Chloride: 99 mEq/L (ref 96–112)
Creatinine, Ser: 4.02 mg/dL — ABNORMAL HIGH (ref 0.50–1.35)
GFR calc Af Amer: 19 mL/min — ABNORMAL LOW (ref >90)
GFR calc non Af Amer: 16 mL/min — ABNORMAL LOW (ref >90)
Glucose, Bld: 95 mg/dL (ref 70–99)
Potassium: 3.8 mEq/L (ref 3.7–5.3)
Sodium: 140 mEq/L (ref 137–147)

## 2013-12-23 LAB — CBC
HCT: 25.3 % — ABNORMAL LOW (ref 39.0–52.0)
Hemoglobin: 8.8 g/dL — ABNORMAL LOW (ref 13.0–17.0)
MCH: 31.8 pg (ref 26.0–34.0)
MCHC: 34.8 g/dL (ref 30.0–36.0)
MCV: 91.3 fL (ref 78.0–100.0)
Platelets: 178 10*3/uL (ref 150–400)
RBC: 2.77 MIL/uL — ABNORMAL LOW (ref 4.22–5.81)
RDW: 13.2 % (ref 11.5–15.5)
WBC: 12.2 10*3/uL — ABNORMAL HIGH (ref 4.0–10.5)

## 2013-12-23 LAB — GLUCOSE, CAPILLARY
Glucose-Capillary: 90 mg/dL (ref 70–99)
Glucose-Capillary: 82 mg/dL (ref 70–99)
Glucose-Capillary: 110 mg/dL — ABNORMAL HIGH (ref 70–99)
Glucose-Capillary: 124 mg/dL — ABNORMAL HIGH (ref 70–99)

## 2013-12-23 LAB — LIPID PANEL
Cholesterol: 153 mg/dL (ref 0–200)
HDL: 25 mg/dL — ABNORMAL LOW (ref >39)
LDL Cholesterol: 104 mg/dL — ABNORMAL HIGH (ref 0–99)
Total CHOL/HDL Ratio: 6.1 RATIO
Triglycerides: 120 mg/dL (ref <150)
VLDL: 24 mg/dL (ref 0–40)

## 2013-12-23 LAB — PHOSPHORUS: Phosphorus: 2.4 mg/dL (ref 2.3–4.6)

## 2013-12-23 LAB — MAGNESIUM: Magnesium: 2 mg/dL (ref 1.5–2.5)

## 2013-12-23 LAB — TSH: TSH: 0.99 u[IU]/mL (ref 0.350–4.500)

## 2013-12-23 LAB — TROPONIN I: Troponin I: 0.32 ng/mL (ref <0.30)

## 2013-12-23 MED ORDER — RENA-VITE PO TABS
1.0000 | ORAL_TABLET | Freq: Every day | ORAL | Status: DC
  Administered 2013-12-23 – 2013-12-24 (×2): 1 via ORAL
  Filled 2013-12-23 (×3): qty 1

## 2013-12-23 MED ORDER — DEXTROSE 5 % IV SOLN
2.0000 g | INTRAVENOUS | Status: DC
  Administered 2013-12-24: 2 g via INTRAVENOUS
  Filled 2013-12-23 (×2): qty 2

## 2013-12-23 MED ORDER — VANCOMYCIN HCL IN DEXTROSE 750-5 MG/150ML-% IV SOLN
750.0000 mg | INTRAVENOUS | Status: DC
  Filled 2013-12-23 (×2): qty 150

## 2013-12-23 NOTE — Progress Notes (Signed)
Utilization review completed. Camiah Humm, RN, BSN. 

## 2013-12-23 NOTE — Progress Notes (Signed)
Echocardiogram 2D Echocardiogram has been performed.  James Nicholson 12/23/2013, 11:56 AM

## 2013-12-23 NOTE — Progress Notes (Signed)
Subjective:   Still has SOB, some pain with deep breathing but feeling a little better  Objective Filed Vitals:   12/22/13 1742 12/22/13 1826 12/22/13 2216 12/23/13 0416  BP: 131/91 109/65 91/59 95/56   Pulse: 114 75 102 104  Temp: 100.4 F (38 C) 99.7 F (37.6 C) 99.4 F (37.4 C) 99.3 F (37.4 C)  TempSrc: Oral Oral Oral Oral  Resp: 21 20 17 15   Height:      Weight: 68.1 kg (150 lb 2.1 oz)  73.1 kg (161 lb 2.5 oz)   SpO2: 100% 100% 95% 96%   Physical Exam General: alert and oriented. Resting in bed. Brother at bedside Heart: RRR no murmur Lungs: bilat bases dim with faint rales. Shallow, unlabored respirations. Dry cough Abdomen: soft nontender, +BS Extremities: no edema Dialysis Access:  RAVF +bruit/thrill  Dialysis Orders: TTS @[facility]   4 hr 68kgs 2K/2.25Ca 180 400/800 [id number] RAVF  calictriol [id number] mcg/HD Epogen 1000U IV/HD Venofer 50 qweek    Assessment/Plan:  1. PNA- workup per primary. afebrile. WBC 12.2 antibx per pharmacy. Chest xray/CT- possible PNA 2. CHF- elevated troponin (0.63--> now 0.32)-, cardiology consulted 3. ESRD - TTS@ [facility]- Off schedule here. HD pending tomorrow. 4. Hypertension/volume -95/56. No BP meds 5. Anemia -hgb 9.5, cont esa and iron (last tsat- 36) watch CBC 6. Metabolic bone disease - Ca+ 8.6 Phos 2.4hold  phoslo. Cont calcitriol 7. Nutrition - alb 3.6 renal diet. multivit    Shelle Iron, NP [place] [phone] 12/23/2013,9:12 AM  LOS: 2 days   I have seen and examined patient, discussed with PA and agree with assessment and plan as outlined above. Kelly Splinter MD pager [phone]    cell [phone] 12/23/2013, 12:07 PM    Additional Objective Labs: Basic Metabolic Panel:  Recent Labs Lab 12/21/13 1015 12/22/13 0620 12/23/13 0121  NA 136* 136* 140  K 3.6* 3.9 3.8  CL 92* 96 99  CO2 25 22 28   GLUCOSE 111* 90 95  BUN 55* 60* 26*  CREATININE 7.14* 6.88* 4.02*  CALCIUM 9.3 9.1 8.6  PHOS  --   --   2.4   Liver Function Tests:  Recent Labs Lab 12/21/13 1015  AST 13  ALT 12  ALKPHOS 67  BILITOT 0.5  PROT 7.3  ALBUMIN 3.6   No results found for this basename: LIPASE, AMYLASE,  in the last 168 hours CBC:  Recent Labs Lab 12/21/13 1015 12/22/13 0620 12/23/13 0121  WBC 13.9* 17.0* 12.2*  HGB 9.6* 9.5* 8.8*  HCT 27.2* 26.8* 25.3*  MCV 90.1 90.8 91.3  PLT 204 201 178   Blood Culture    Component Value Date/Time   SDES URINE, CLEAN CATCH 02/05/2013 0127   SPECREQUEST Normal 02/05/2013 0127   CULT NO GROWTH 02/05/2013 0127   REPTSTATUS 02/06/2013 FINAL 02/05/2013 0127    Cardiac Enzymes:  Recent Labs Lab 12/22/13 12/22/13 0620 12/22/13 1139 12/22/13 1922 12/23/13 0121  TROPONINI <0.30 0.63* 0.55* 0.42* 0.32*   CBG:  Recent Labs Lab 12/22/13 0756 12/22/13 1212 12/22/13 1819 12/22/13 2212 12/23/13 0716  GLUCAP 88 96 89 122* 90   Iron Studies: No results found for this basename: IRON, TIBC, TRANSFERRIN, FERRITIN,  in the last 72 hours @lablastinr3 @ Studies/Results: Dg Chest 2 View  12/21/2013   CLINICAL DATA:  Chest pain and shortness of breath.  EXAM: CHEST  2 VIEW  COMPARISON:  PA and lateral chest 07/22/2013.  FINDINGS: The patient has bibasilar airspace disease, worse on the left. Lung volumes  are lower than on the comparison study. Heart size is mildly enlarged. No pneumothorax or pleural fluid. Mild elevation of the right hemidiaphragm and AICD are again seen. Dialysis catheter present on the prior study has been removed.  IMPRESSION: Left worse than right basilar airspace disease could be due to atelectasis or pneumonia.   Electronically Signed   By: Inge Rise M.D.   On: 12/21/2013 10:55   Ct Chest Wo Contrast  12/21/2013   CLINICAL DATA:  48 year old male with chest pain and shortness of breath. History of Wegener's granulomatosis.  EXAM: CT CHEST WITHOUT CONTRAST  TECHNIQUE: Multidetector CT imaging of the chest was performed following the  standard protocol without IV contrast.  COMPARISON:  04/12/2013 chest CT. 12/21/2013 and prior chest radiographs  FINDINGS: Respiratory motion artifact in the lower lungs decreases sensitivity slightly limits evaluation.  Cardiomegaly and AICD again noted.  There is no evidence of pleural effusion.  A very small pericardial effusion versus pericardial thickening noted, increased since the prior study.  No enlarged lymph nodes are present.  Bilateral lower lobe opacities may represent atelectasis and/or airspace disease. .  No discrete nodules or mass identified.  The visualized upper abdomen is unremarkable.  No acute or suspicious bony abnormalities are present.  IMPRESSION: Bilateral lower lobe opacities, question atelectasis and/or airspace disease - possibly representing pneumonia.  Cardiomegaly with increasing very small pericardial effusion versus pericardial thickening. Pericarditis is not excluded.   Electronically Signed   By: Hassan Rowan M.D.   On: 12/21/2013 21:35   Medications:   . aspirin EC  81 mg Oral Daily  . calcium acetate  667 mg Oral Q supper  . ceFEPime (MAXIPIME) IV  2 g Intravenous Q T,Th,Sa-HD  . heparin  5,000 Units Subcutaneous 3 times per day  . insulin aspart  0-9 Units Subcutaneous TID WC  . multivitamin with minerals  1 tablet Oral Daily  . pantoprazole  40 mg Oral Daily  . risperiDONE  0.25 mg Oral QHS  . vancomycin  750 mg Intravenous Q T,Th,Sa-HD

## 2013-12-23 NOTE — Progress Notes (Addendum)
[facility]                                                                              Patient Demographics  James Nicholson, is a 48 y.o. male, DOB - [birth year], [id number]  Admit date - 12/21/2013   Admitting Physician Velvet Bathe, MD  Outpatient Primary MD for the patient is [name],[name], MD  LOS - 2   Chief Complaint  Patient presents with  . Chest Pain  . Shortness of Breath        Assessment & Plan   NSTEMI, type 2 -Patient had last troponin 0.32, trending downward -Patient still complains of chest pain. -Echocardiogram has been ordered and is currently -Possibly secondary to demand ischemia, no history of heart myopathy in the setting of renal failure and questionable pericarditis -Cardiology consulted -Lipid panel: 153/120/25/104  Questionable pericarditis -Pericarditis cannot rule out CT chest. 2-D echocardiogram ordered by cardiology currently pending. -If truly pericarditis, patient may need to be on colchicine or other type of NSAID oral prednisone. Will discuss with nephrology.  HCAP -Chest CT shows bilateral lower lobe opacities, possibly representing pneumonia -Continue coverage, vancomycin and cefepime -Currently afebrile, leukocytosis trending downward  End stage renal disease requiring hemodialysis -Patient is a Tuesday Thursday Saturday dialysis patient -Nephrology consulted -Continue PhosLo  Wegener's disease -Currently not on steroids -Patient will need a followup CT of the chest  Anemia of chronic disease -Hemodynamically stable -Hemoglobin 8.8 -Will continue to monitor  Systolic CHF status post ICD implant -Patient currently euvolemic and does not appear to be in exacerbation -Continue hemodialysis for volume control -Last documented EF, 2010: 40-45% -Pending echocardiogram  Questionable history of diabetes mellitus -Hemoglobin A1c 4.5  Code Status: Full  Family Communiation: Brother at  bedside  Disposition Plan: Admitted  Time Spent in minutes   30 minutes  Procedures  None  Consults   Nephrology Cardiology  DVT Prophylaxis heparin  Lab Results  Component Value Date   PLT 178 12/23/2013    Medications  Scheduled Meds: . aspirin EC  81 mg Oral Daily  . calcium acetate  667 mg Oral Q supper  . ceFEPime (MAXIPIME) IV  2 g Intravenous Q T,Th,Sa-HD  . heparin  5,000 Units Subcutaneous 3 times per day  . insulin aspart  0-9 Units Subcutaneous TID WC  . multivitamin with minerals  1 tablet Oral Daily  . pantoprazole  40 mg Oral Daily  . risperiDONE  0.25 mg Oral QHS  . vancomycin  750 mg Intravenous Q T,Th,Sa-HD   Continuous Infusions:  PRN Meds:.acetaminophen, acetaminophen, albuterol, morphine injection, nitroGLYCERIN, ondansetron (ZOFRAN) IV, ondansetron  Antibiotics    Anti-infectives   Start     Dose/Rate Route Frequency Ordered Stop   12/23/13 1200  vancomycin (VANCOCIN) IVPB 750 mg/150 ml premix     750 mg 150 mL/hr over 60 Minutes Intravenous Every T-Th-Sa (Hemodialysis) 12/21/13 1424     12/23/13 1200  ceFEPIme (MAXIPIME) 2 g in dextrose 5 % 50 mL IVPB     2 g 100 mL/hr over 30 Minutes Intravenous Every T-Th-Sa (Hemodialysis) 12/21/13 1424     12/21/13 1900  vancomycin (VANCOCIN) IVPB 750 mg/150 ml premix  Status:  Discontinued     750 mg 150 mL/hr over 60 Minutes Intravenous Every T-Th-Sa (Hemodialysis) 12/21/13 1212 12/21/13 1424   12/21/13 1700  ceFEPIme (MAXIPIME) 2 g in dextrose 5 % 50 mL IVPB  Status:  Discontinued     2 g 100 mL/hr over 30 Minutes Intravenous Every T-Th-Sa (Hemodialysis) 12/21/13 1212 12/21/13 1424   12/21/13 1215  vancomycin (VANCOCIN) 1,500 mg in sodium chloride 0.9 % 500 mL IVPB     1,500 mg 250 mL/hr over 120 Minutes Intravenous  Once 12/21/13 1212 12/21/13 1504   12/21/13 1215  ceFEPIme (MAXIPIME) 2 g in dextrose 5 % 50 mL IVPB     2 g 100 mL/hr over 30 Minutes Intravenous  Once 12/21/13 1212 12/21/13 1645         Subjective:   James Nicholson seen and examined today.  Patient complains of chest pain.  He is not coughing as much.  His brother is at the bedside and interpreting.    Objective:   Filed Vitals:   12/22/13 1742 12/22/13 1826 12/22/13 2216 12/23/13 0416  BP: 131/91 109/65 91/59 95/56   Pulse: 114 75 102 104  Temp: 100.4 F (38 C) 99.7 F (37.6 C) 99.4 F (37.4 C) 99.3 F (37.4 C)  TempSrc: Oral Oral Oral Oral  Resp: 21 20 17 15   Height:      Weight: 68.1 kg (150 lb 2.1 oz)  73.1 kg (161 lb 2.5 oz)   SpO2: 100% 100% 95% 96%    Wt Readings from Last 3 Encounters:  12/22/13 73.1 kg (161 lb 2.5 oz)  08/09/13 68.04 kg (150 lb)  08/09/13 68.04 kg (150 lb)     Intake/Output Summary (Last 24 hours) at 12/23/13 0809 Last data filed at 12/22/13 2200  Gross per 24 hour  Intake    240 ml  Output    900 ml  Net   -660 ml    Exam  General: Well developed, well nourished, NAD, appears stated age  HEENT: NCAT, mucous membranes moist.  Neck: Supple, no JVD, no masses  Cardiovascular: S1 S2 auscultated,  Regular rate and rhythm. Pain with palpation of the chest wall.  Respiratory: Sounds improved, few rales noted.  Abdomen: Soft, nontender, nondistended, + bowel sounds  Extremities: warm dry without cyanosis clubbing or edema  Neuro: AAOx3, cranial nerves grossly intact. No focal deficits  Skin: Without rashes exudates or nodules  Psych: Appropriate affect and demeanor   Data Review   Micro Results Recent Results (from the past 240 hour(s))  MRSA PCR SCREENING     Status: None   Collection Time    12/21/13  8:57 PM      Result Value Ref Range Status   MRSA by PCR NEGATIVE  NEGATIVE Final   Comment:            The GeneXpert MRSA Assay (FDA     approved for NASAL specimens     only), is one component of a     comprehensive MRSA colonization     surveillance program. It is not     intended to diagnose MRSA     infection nor to guide or     monitor  treatment for     MRSA infections.    Radiology Reports Dg Chest 2 View  12/21/2013   CLINICAL DATA:  Chest pain and shortness of breath.  EXAM: CHEST  2 VIEW  COMPARISON:  PA and lateral chest 07/22/2013.  FINDINGS: The patient has bibasilar airspace  disease, worse on the left. Lung volumes are lower than on the comparison study. Heart size is mildly enlarged. No pneumothorax or pleural fluid. Mild elevation of the right hemidiaphragm and AICD are again seen. Dialysis catheter present on the prior study has been removed.  IMPRESSION: Left worse than right basilar airspace disease could be due to atelectasis or pneumonia.   Electronically Signed   By: Inge Rise M.D.   On: 12/21/2013 10:55   Ct Chest Wo Contrast  12/21/2013   CLINICAL DATA:  48 year old male with chest pain and shortness of breath. History of Wegener's granulomatosis.  EXAM: CT CHEST WITHOUT CONTRAST  TECHNIQUE: Multidetector CT imaging of the chest was performed following the standard protocol without IV contrast.  COMPARISON:  04/12/2013 chest CT. 12/21/2013 and prior chest radiographs  FINDINGS: Respiratory motion artifact in the lower lungs decreases sensitivity slightly limits evaluation.  Cardiomegaly and AICD again noted.  There is no evidence of pleural effusion.  A very small pericardial effusion versus pericardial thickening noted, increased since the prior study.  No enlarged lymph nodes are present.  Bilateral lower lobe opacities may represent atelectasis and/or airspace disease. .  No discrete nodules or mass identified.  The visualized upper abdomen is unremarkable.  No acute or suspicious bony abnormalities are present.  IMPRESSION: Bilateral lower lobe opacities, question atelectasis and/or airspace disease - possibly representing pneumonia.  Cardiomegaly with increasing very small pericardial effusion versus pericardial thickening. Pericarditis is not excluded.   Electronically Signed   By: Hassan Rowan M.D.   On:  12/21/2013 21:35    CBC  Recent Labs Lab 12/21/13 1015 12/22/13 0620 12/23/13 0121  WBC 13.9* 17.0* 12.2*  HGB 9.6* 9.5* 8.8*  HCT 27.2* 26.8* 25.3*  PLT 204 201 178  MCV 90.1 90.8 91.3  MCH 31.8 32.2 31.8  MCHC 35.3 35.4 34.8  RDW 13.4 13.2 13.2    Chemistries   Recent Labs Lab 12/21/13 1015 12/22/13 0620 12/23/13 0121  NA 136* 136* 140  K 3.6* 3.9 3.8  CL 92* 96 99  CO2 25 22 28   GLUCOSE 111* 90 95  BUN 55* 60* 26*  CREATININE 7.14* 6.88* 4.02*  CALCIUM 9.3 9.1 8.6  MG  --   --  2.0  AST 13  --   --   ALT 12  --   --   ALKPHOS 67  --   --   BILITOT 0.5  --   --    ------------------------------------------------------------------------------------------------------------------ estimated creatinine clearance is 20.6 ml/min (by C-G formula based on Cr of 4.02). ------------------------------------------------------------------------------------------------------------------  Recent Labs  12/21/13 2010  HGBA1C 4.5   ------------------------------------------------------------------------------------------------------------------  Recent Labs  12/23/13 0121  CHOL 153  HDL 25*  LDLCALC 104*  TRIG 120  CHOLHDL 6.1   ------------------------------------------------------------------------------------------------------------------ No results found for this basename: TSH, T4TOTAL, FREET3, T3FREE, THYROIDAB,  in the last 72 hours ------------------------------------------------------------------------------------------------------------------ No results found for this basename: VITAMINB12, FOLATE, FERRITIN, TIBC, IRON, RETICCTPCT,  in the last 72 hours  Coagulation profile No results found for this basename: INR, PROTIME,  in the last 168 hours  No results found for this basename: DDIMER,  in the last 72 hours  Cardiac Enzymes  Recent Labs Lab 12/22/13 1139 12/22/13 1922 12/23/13 0121  TROPONINI 0.55* 0.42* 0.32*    ------------------------------------------------------------------------------------------------------------------ No components found with this basename: POCBNP,     Alphonza Tramell D.O. on 12/23/2013 at 8:09 AM  Between 7am to 7pm - Pager - [phone]  After 7pm go to [email or web address] -  password [email or web address]  And look for the night coverage person covering for me after hours  [facility] Office  [phone]

## 2013-12-23 NOTE — Progress Notes (Signed)
ANTIBIOTIC CONSULT NOTE - FOLLOW UP  Pharmacy Consult for Vancomycin and Cefepime Indication: HCAP  No Known Allergies  Patient Measurements: Height: 5' 6.14" (168 cm) Weight: 157 lb 6.5 oz (71.4 kg) IBW/kg (Calculated) : 64.13  Vital Signs: Temp: 98.1 F (36.7 C) (02/26 1011) Temp src: Oral (02/26 0416) BP: 105/67 mmHg (02/26 1011) Pulse Rate: 104 (02/26 1011) Intake/Output from previous day: 02/25 0701 - 02/26 0700 In: 240 [P.O.:240] Out: 900  Intake/Output from this shift: Total I/O In: 220 [P.O.:220] Out: -   Labs:  Recent Labs  12/21/13 1015 12/22/13 0620 12/23/13 0121  WBC 13.9* 17.0* 12.2*  HGB 9.6* 9.5* 8.8*  PLT 204 201 178  CREATININE 7.14* 6.88* 4.02*   Estimated Creatinine Clearance: 20.6 ml/min (by C-G formula based on Cr of 4.02). No results found for this basename: VANCOTROUGH, Corlis Leak, VANCORANDOM, [place], GENTPEAK, [place], [place], TOBRAPEAK, TOBRARND, AMIKACINPEAK, AMIKACINTROU, AMIKACIN,  in the last 72 hours   Microbiology: Recent Results (from the past 720 hour(s))  MRSA PCR SCREENING     Status: None   Collection Time    12/21/13  8:57 PM      Result Value Ref Range Status   MRSA by PCR NEGATIVE  NEGATIVE Final   Comment:            The GeneXpert MRSA Assay (FDA     approved for NASAL specimens     only), is one component of a     comprehensive MRSA colonization     surveillance program. It is not     intended to diagnose MRSA     infection nor to guide or     monitor treatment for     MRSA infections.    Anti-infectives   Start     Dose/Rate Route Frequency Ordered Stop   12/23/13 1200  vancomycin (VANCOCIN) IVPB 750 mg/150 ml premix     750 mg 150 mL/hr over 60 Minutes Intravenous Every T-Th-Sa (Hemodialysis) 12/21/13 1424     12/23/13 1200  ceFEPIme (MAXIPIME) 2 g in dextrose 5 % 50 mL IVPB     2 g 100 mL/hr over 30 Minutes Intravenous Every T-Th-Sa (Hemodialysis) 12/21/13 1424     12/21/13 1900  vancomycin  (VANCOCIN) IVPB 750 mg/150 ml premix  Status:  Discontinued     750 mg 150 mL/hr over 60 Minutes Intravenous Every T-Th-Sa (Hemodialysis) 12/21/13 1212 12/21/13 1424   12/21/13 1700  ceFEPIme (MAXIPIME) 2 g in dextrose 5 % 50 mL IVPB  Status:  Discontinued     2 g 100 mL/hr over 30 Minutes Intravenous Every T-Th-Sa (Hemodialysis) 12/21/13 1212 12/21/13 1424   12/21/13 1215  vancomycin (VANCOCIN) 1,500 mg in sodium chloride 0.9 % 500 mL IVPB     1,500 mg 250 mL/hr over 120 Minutes Intravenous  Once 12/21/13 1212 12/21/13 1504   12/21/13 1215  ceFEPIme (MAXIPIME) 2 g in dextrose 5 % 50 mL IVPB     2 g 100 mL/hr over 30 Minutes Intravenous  Once 12/21/13 1212 12/21/13 1645      Assessment: 48 yo M on Vancomycin and Cefepime for HCAP.  Currently HD is off schedule and antibiotic doses were missed 2/25.  Will reorder doses to be given now.  Pt is afebrile and WBC are trending down.  Goal of Therapy:  pre-HD Vancomycin level 15-25 mcg/ml  Plan:  Vancomycin 750mg  IV x 1 now. Cefepime 2gm IV x 1 now. Change future antibiotic doses to MWF schedule (next HD 2/27) and  follow-up transition back to TTS schedule.  [employer], Pharm.D., BCPS Clinical Pharmacist Pager [phone] 12/23/2013 10:35 AM

## 2013-12-23 NOTE — Progress Notes (Signed)
SUBJECTIVE:  Speaks very little English but says his chest still hurts  OBJECTIVE:   Vitals:   Filed Vitals:   12/22/13 1742 12/22/13 1826 12/22/13 2216 12/23/13 0416  BP: 131/91 109/65 91/59 95/56   Pulse: 114 75 102 104  Temp: 100.4 F (38 C) 99.7 F (37.6 C) 99.4 F (37.4 C) 99.3 F (37.4 C)  TempSrc: Oral Oral Oral Oral  Resp: 21 20 17 15   Height:      Weight: 150 lb 2.1 oz (68.1 kg)  161 lb 2.5 oz (73.1 kg)   SpO2: 100% 100% 95% 96%   I&O's:   Intake/Output Summary (Last 24 hours) at 12/23/13 [id number] Last data filed at 12/22/13 2200  Gross per 24 hour  Intake    240 ml  Output    900 ml  Net   -660 ml   TELEMETRY: Reviewed telemetry pt in NSR - sinus tach     PHYSICAL EXAM General: Well developed, well nourished, in no acute distress Head: Eyes PERRLA, No xanthomas.   Normal cephalic and atramatic  Lungs:   Clear bilaterally to auscultation and percussion. Heart:   HRRR S1 S2 Pulses are 2+ & equal. Abdomen: Bowel sounds are positive, abdomen soft and non-tender without masses Extremities:   No clubbing, cyanosis or edema.  DP +1 Neuro: Alert and oriented X 3. Psych:  Good affect, responds appropriately   LABS: Basic Metabolic Panel:  Recent Labs  12/22/13 0620 12/23/13 0121  NA 136* 140  K 3.9 3.8  CL 96 99  CO2 22 28  GLUCOSE 90 95  BUN 60* 26*  CREATININE 6.88* 4.02*  CALCIUM 9.1 8.6  MG  --  2.0  PHOS  --  2.4   Liver Function Tests:  Recent Labs  12/21/13 1015  AST 13  ALT 12  ALKPHOS 67  BILITOT 0.5  PROT 7.3  ALBUMIN 3.6   No results found for this basename: LIPASE, AMYLASE,  in the last 72 hours CBC:  Recent Labs  12/22/13 0620 12/23/13 0121  WBC 17.0* 12.2*  HGB 9.5* 8.8*  HCT 26.8* 25.3*  MCV 90.8 91.3  PLT 201 178   Cardiac Enzymes:  Recent Labs  12/22/13 1139 12/22/13 1922 12/23/13 0121  TROPONINI 0.55* 0.42* 0.32*   BNP: No components found with this basename: POCBNP,  D-Dimer: No results found for this  basename: DDIMER,  in the last 72 hours Hemoglobin A1C:  Recent Labs  12/21/13 2010  HGBA1C 4.5   Fasting Lipid Panel:  Recent Labs  12/23/13 0121  CHOL 153  HDL 25*  LDLCALC 104*  TRIG 120  CHOLHDL 6.1   Thyroid Function Tests: No results found for this basename: TSH, T4TOTAL, FREET3, T3FREE, THYROIDAB,  in the last 72 hours Anemia Panel: No results found for this basename: VITAMINB12, FOLATE, FERRITIN, TIBC, IRON, RETICCTPCT,  in the last 72 hours Coag Panel:   Lab Results  Component Value Date   INR 1.30 02/05/2013    RADIOLOGY: Dg Chest 2 View  12/21/2013   CLINICAL DATA:  Chest pain and shortness of breath.  EXAM: CHEST  2 VIEW  COMPARISON:  PA and lateral chest 07/22/2013.  FINDINGS: The patient has bibasilar airspace disease, worse on the left. Lung volumes are lower than on the comparison study. Heart size is mildly enlarged. No pneumothorax or pleural fluid. Mild elevation of the right hemidiaphragm and AICD are again seen. Dialysis catheter present on the prior study has been removed.  IMPRESSION: Left worse than  right basilar airspace disease could be due to atelectasis or pneumonia.   Electronically Signed   By: Inge Rise M.D.   On: 12/21/2013 10:55   Ct Chest Wo Contrast  12/21/2013   CLINICAL DATA:  48 year old male with chest pain and shortness of breath. History of Wegener's granulomatosis.  EXAM: CT CHEST WITHOUT CONTRAST  TECHNIQUE: Multidetector CT imaging of the chest was performed following the standard protocol without IV contrast.  COMPARISON:  04/12/2013 chest CT. 12/21/2013 and prior chest radiographs  FINDINGS: Respiratory motion artifact in the lower lungs decreases sensitivity slightly limits evaluation.  Cardiomegaly and AICD again noted.  There is no evidence of pleural effusion.  A very small pericardial effusion versus pericardial thickening noted, increased since the prior study.  No enlarged lymph nodes are present.  Bilateral lower lobe  opacities may represent atelectasis and/or airspace disease. .  No discrete nodules or mass identified.  The visualized upper abdomen is unremarkable.  No acute or suspicious bony abnormalities are present.  IMPRESSION: Bilateral lower lobe opacities, question atelectasis and/or airspace disease - possibly representing pneumonia.  Cardiomegaly with increasing very small pericardial effusion versus pericardial thickening. Pericarditis is not excluded.   Electronically Signed   By: Hassan Rowan M.D.   On: 12/21/2013 21:35    IMPRESSION:  Principal Problem:  HCAP (healthcare-associated pneumonia)  Active Problems:  Hypertension  Automatic implantable cardioverter-defibrillator in situ  End stage renal disease  Troponin level elevated  Wegener's disease, pulmonary   His discomfort in his chest began Monday, he had 3 negative troponins with a fourth (18 hours later) being positive despite not having any significant discomfort while in the hospital. His enzymes have peaked and now trending downward but never got very high.  He has been somewhat tachycardic with what appears to be an occasionally irregular rhythm as well as some telemetry strips showing possible atrial flutter (although this is difficult to determine the setting of right branch block).  Chest CT shows a suggestion of a possible pericardial effusion, and in the setting of a known autoimmune disease, we cannot exclude the possibility that his symptoms are related to pericarditis, which is a possibility by the positional nature of his discomfort. At this point, I would prefer not to anticoagulate in the absence of symptoms.  Differential diagnosis: True non-STEMI, type II MI with pneumonia in the setting of known cardiomyopathy and renal failure, pericarditis.  I favor at this point, based on enzyme trend, that this is type II NSTEMI from demand ischemia in the setting of DCM, PNA and renal failure.    RECOMMENDATION:  At this time I would not  fully anticoagulate especially since we have not ruled out pericarditis yet.  My preference would be to consider noninvasive evaluation with stress test once he is more stable from a Respiratory standpoint.    2-D echocardiogram has been ordered and is pending to evaluate his EF, as well as for possible pericardial effusion that would corroborate pericarditis. If there is indeed a suggestion of possible pericarditis, would possibly consider colchicine in addition to NSAIDs for anti-inflammatory effect, however with this being a possible autoimmune related condition, prednisone may also be appropriate. Would consider discussion with nephrology or rheumatology for their input.  He is currently tachycardic, in the absence of discomfort or arrhythmia. This is probably related to ongoing inflammatory condition/infection. He does not seem dehydrated, and is asymptomatic with mild tachycardia. For now I would not treat unless he is symptomatic. He will perhaps require  beta blocker therapy in the future if his blood pressure would tolerate.      Sueanne Margarita, MD  12/23/2013  9:23 AM

## 2013-12-24 DIAGNOSIS — J189 Pneumonia, unspecified organism: Secondary | ICD-10-CM | POA: Diagnosis not present

## 2013-12-24 DIAGNOSIS — D649 Anemia, unspecified: Secondary | ICD-10-CM | POA: Diagnosis not present

## 2013-12-24 DIAGNOSIS — N186 End stage renal disease: Secondary | ICD-10-CM | POA: Diagnosis not present

## 2013-12-24 DIAGNOSIS — F209 Schizophrenia, unspecified: Secondary | ICD-10-CM | POA: Diagnosis not present

## 2013-12-24 DIAGNOSIS — I309 Acute pericarditis, unspecified: Secondary | ICD-10-CM | POA: Diagnosis not present

## 2013-12-24 DIAGNOSIS — R799 Abnormal finding of blood chemistry, unspecified: Secondary | ICD-10-CM | POA: Diagnosis not present

## 2013-12-24 DIAGNOSIS — Z95810 Presence of automatic (implantable) cardiac defibrillator: Secondary | ICD-10-CM | POA: Diagnosis not present

## 2013-12-24 LAB — CBC
HCT: 25.4 % — ABNORMAL LOW (ref 39.0–52.0)
Hemoglobin: 8.7 g/dL — ABNORMAL LOW (ref 13.0–17.0)
MCH: 31.3 pg (ref 26.0–34.0)
MCHC: 34.3 g/dL (ref 30.0–36.0)
MCV: 91.4 fL (ref 78.0–100.0)
Platelets: 214 10*3/uL (ref 150–400)
RBC: 2.78 MIL/uL — ABNORMAL LOW (ref 4.22–5.81)
RDW: 13.2 % (ref 11.5–15.5)
WBC: 8.3 10*3/uL (ref 4.0–10.5)

## 2013-12-24 LAB — RENAL FUNCTION PANEL
Albumin: 2.5 g/dL — ABNORMAL LOW (ref 3.5–5.2)
BUN: 44 mg/dL — ABNORMAL HIGH (ref 6–23)
CO2: 26 mEq/L (ref 19–32)
Calcium: 9 mg/dL (ref 8.4–10.5)
Chloride: 100 mEq/L (ref 96–112)
Creatinine, Ser: 5.55 mg/dL — ABNORMAL HIGH (ref 0.50–1.35)
GFR calc Af Amer: 13 mL/min — ABNORMAL LOW (ref >90)
GFR calc non Af Amer: 11 mL/min — ABNORMAL LOW (ref >90)
Glucose, Bld: 91 mg/dL (ref 70–99)
Phosphorus: 2.8 mg/dL (ref 2.3–4.6)
Potassium: 3.9 mEq/L (ref 3.7–5.3)
Sodium: 140 mEq/L (ref 137–147)

## 2013-12-24 LAB — GLUCOSE, CAPILLARY
Glucose-Capillary: 100 mg/dL — ABNORMAL HIGH (ref 70–99)
Glucose-Capillary: 116 mg/dL — ABNORMAL HIGH (ref 70–99)
Glucose-Capillary: 113 mg/dL — ABNORMAL HIGH (ref 70–99)
Glucose-Capillary: 118 mg/dL — ABNORMAL HIGH (ref 70–99)

## 2013-12-24 MED ORDER — LIDOCAINE HCL (PF) 1 % IJ SOLN: 5.0000 mL | INTRAMUSCULAR | Status: DC | PRN

## 2013-12-24 MED ORDER — SODIUM CHLORIDE 0.9 % IV SOLN: 100.0000 mL | INTRAVENOUS | Status: DC | PRN

## 2013-12-24 MED ORDER — VANCOMYCIN HCL 1000 MG IV SOLR
1500.0000 mg | INTRAVENOUS | Status: AC
  Administered 2013-12-24: 1500 mg via INTRAVENOUS
  Filled 2013-12-24: qty 1500

## 2013-12-24 MED ORDER — PENTAFLUOROPROP-TETRAFLUOROETH EX AERO: 1.0000 "application " | INHALATION_SPRAY | CUTANEOUS | Status: DC | PRN

## 2013-12-24 MED ORDER — HEPARIN SODIUM (PORCINE) 1000 UNIT/ML DIALYSIS
1000.0000 [IU] | INTRAMUSCULAR | Status: DC | PRN
  Filled 2013-12-24: qty 1

## 2013-12-24 MED ORDER — HEPARIN SODIUM (PORCINE) 1000 UNIT/ML DIALYSIS
2000.0000 [IU] | Freq: Once | INTRAMUSCULAR | Status: DC
  Filled 2013-12-24: qty 2

## 2013-12-24 MED ORDER — NEPRO/CARBSTEADY PO LIQD: 237.0000 mL | ORAL | Status: DC | PRN

## 2013-12-24 MED ORDER — LIDOCAINE-PRILOCAINE 2.5-2.5 % EX CREA: 1.0000 "application " | TOPICAL_CREAM | CUTANEOUS | Status: DC | PRN

## 2013-12-24 MED ORDER — ALTEPLASE 2 MG IJ SOLR
2.0000 mg | Freq: Once | INTRAMUSCULAR | Status: DC | PRN
Start: 2013-12-24 — End: 2013-12-24
  Filled 2013-12-24: qty 2

## 2013-12-24 MED ORDER — NEPRO/CARBSTEADY PO LIQD
237.0000 mL | Freq: Two times a day (BID) | ORAL | Status: DC
  Administered 2013-12-24 – 2013-12-25 (×3): 237 mL via ORAL

## 2013-12-24 MED ORDER — VANCOMYCIN HCL IN DEXTROSE 750-5 MG/150ML-% IV SOLN: 750.0000 mg | INTRAVENOUS | Status: DC

## 2013-12-24 NOTE — Progress Notes (Signed)
ANTIBIOTIC CONSULT NOTE - FOLLOW UP  Pharmacy Consult for Vancomycin and Cefepime Indication: HCAP  No Known Allergies  Patient Measurements: Height: 5' 6.14" (168 cm) Weight: 157 lb 3 oz (71.3 kg) IBW/kg (Calculated) : 64.13  Vital Signs: Temp: 98.5 F (36.9 C) (02/27 0831) Temp src: Oral (02/27 0831) BP: 111/67 mmHg (02/27 1000) Pulse Rate: 87 (02/27 1000) Intake/Output from previous day: 02/26 0701 - 02/27 0700 In: 460 [P.O.:460] Out: -  Intake/Output from this shift:    Labs:  Recent Labs  12/22/13 0620 12/23/13 0121 12/24/13 0405 12/24/13 0859  WBC 17.0* 12.2* 8.3  --   HGB 9.5* 8.8* 8.7*  --   PLT 201 178 214  --   CREATININE 6.88* 4.02*  --  5.55*   Estimated Creatinine Clearance: 14.9 ml/min (by C-G formula based on Cr of 5.55).    Microbiology: Recent Results (from the past 720 hour(s))  MRSA PCR SCREENING     Status: None   Collection Time    12/21/13  8:57 PM      Result Value Ref Range Status   MRSA by PCR NEGATIVE  NEGATIVE Final   Comment:            The GeneXpert MRSA Assay (FDA     approved for NASAL specimens     only), is one component of a     comprehensive MRSA colonization     surveillance program. It is not     intended to diagnose MRSA     infection nor to guide or     monitor treatment for     MRSA infections.    Anti-infectives   Start     Dose/Rate Route Frequency Ordered Stop   12/27/13 1200  vancomycin (VANCOCIN) IVPB 750 mg/150 ml premix     750 mg 150 mL/hr over 60 Minutes Intravenous Every M-W-F (Hemodialysis) 12/24/13 0857     12/24/13 0900  vancomycin (VANCOCIN) 1,500 mg in sodium chloride 0.9 % 250 mL IVPB     1,500 mg 250 mL/hr over 60 Minutes Intravenous To Hemodialysis 12/24/13 0857 12/25/13 0900   12/23/13 1200  vancomycin (VANCOCIN) IVPB 750 mg/150 ml premix  Status:  Discontinued     750 mg 150 mL/hr over 60 Minutes Intravenous Every T-Th-Sa (Hemodialysis) 12/21/13 1424 12/23/13 1036   12/23/13 1200   ceFEPIme (MAXIPIME) 2 g in dextrose 5 % 50 mL IVPB  Status:  Discontinued     2 g 100 mL/hr over 30 Minutes Intravenous Every T-Th-Sa (Hemodialysis) 12/21/13 1424 12/23/13 1036   12/23/13 1045  ceFEPIme (MAXIPIME) 2 g in dextrose 5 % 50 mL IVPB     2 g 100 mL/hr over 30 Minutes Intravenous Every M-W-F (1800) 12/23/13 1036     12/23/13 1045  vancomycin (VANCOCIN) IVPB 750 mg/150 ml premix  Status:  Discontinued     750 mg 150 mL/hr over 60 Minutes Intravenous Every M-W-F (Hemodialysis) 12/23/13 1036 12/24/13 0857   12/21/13 1900  vancomycin (VANCOCIN) IVPB 750 mg/150 ml premix  Status:  Discontinued     750 mg 150 mL/hr over 60 Minutes Intravenous Every T-Th-Sa (Hemodialysis) 12/21/13 1212 12/21/13 1424   12/21/13 1700  ceFEPIme (MAXIPIME) 2 g in dextrose 5 % 50 mL IVPB  Status:  Discontinued     2 g 100 mL/hr over 30 Minutes Intravenous Every T-Th-Sa (Hemodialysis) 12/21/13 1212 12/21/13 1424   12/21/13 1215  vancomycin (VANCOCIN) 1,500 mg in sodium chloride 0.9 % 500 mL IVPB  1,500 mg 250 mL/hr over 120 Minutes Intravenous  Once 12/21/13 1212 12/21/13 1504   12/21/13 1215  ceFEPIme (MAXIPIME) 2 g in dextrose 5 % 50 mL IVPB     2 g 100 mL/hr over 30 Minutes Intravenous  Once 12/21/13 1212 12/21/13 1645      Assessment: 48 yo M on Vancomycin and Cefepime for HCAP.  Currently HD is off schedule and antibiotic doses were missed 2/25.  Will reorder doses to be given now.  Pt is afebrile and WBC are trending down.  Antibiotic doses ordered for 2/26 were also not given.  Pt in hemodialysis now.  I have communicated to hemodialysis RN that the patient will need a larger dose of Vancomycin today (1500 mg) due to previous missed doses.  I have also submitted a safety zone to follow up on the incident.  Goal of Therapy:  pre-HD Vancomycin level 15-25 mcg/ml  Plan:  Vancomycin 1500mg  IV x 1 now with hemodialysis. Vancomycin 750 mg IV MWF (nest dose 3/2). Cefepime 2gm IV x 1 MWF. With HD  sessions off schedule, will need to monitor closely and change antibiotic due dates/times accordingly.    [employer], Pharm.D., BCPS Clinical Pharmacist Pager [phone] 12/24/2013 10:31 AM

## 2013-12-24 NOTE — Progress Notes (Signed)
   CARE MANAGEMENT NOTE 12/24/2013  Patient:  James Nicholson, James Nicholson   Account Number:  [id number]  Date Initiated:  12/24/2013  Documentation initiated by:  Lizabeth Leyden  Subjective/Objective Assessment:   admitted from [place] home with HCAP  ESRD/HD     Action/Plan:   progression of care and discharge planning   Anticipated DC Date:  12/25/2013   Anticipated DC Plan:  GROUP HOME  In-house referral  Clinical Social Worker         Choice offered to / List presented to:             Status of service:  Completed, signed off Medicare Important Message given?   (If response is "NO", the following Medicare IM given date fields will be blank) Date Medicare IM given:   Date Additional Medicare IM given:    Discharge Disposition:  GROUP HOME  Per UR Regulation:    If discussed at Long Length of Stay Meetings, dates discussed:    Comments:  12/24/2013  James Nicholson, James Nicholson  call to brother [name] [phone] regarding living situation prior to admission and plans for discharge. He stated the patient lives at [facility] and plan to return there at discharge  [place] home:  [place] per Gerald Stabs a group home and would take the patient back at discharge

## 2013-12-24 NOTE — Progress Notes (Signed)
[facility]                                                                              Patient Demographics  James Nicholson, is a 48 y.o. male, DOB - 04-05-1966, MD:[id number]  Admit date - 12/21/2013   Admitting Physician Velvet Bathe, MD  Outpatient Primary MD for the patient is [name],[name], MD  LOS - 3   Chief Complaint  Patient presents with  . Chest Pain  . Shortness of Breath        Assessment & Plan   NSTEMI, type 2 -Patient had last troponin 0.32, trending downward -Patient still complains of chest pain however associated with coughing -Echocardiogram: EF 45-50%, diffuse hypokinesis, grade 1 diastolic dysfunction -Possibly secondary to demand ischemia, no history of heart myopathy in the setting of renal failure and questionable pericarditis -Cardiology consulted -Lipid panel: 153/120/25/104  Questionable pericarditis -Pericarditis cannot rule out CT chest.  -2-D echocardiogram did not mention pericarditis -If truly pericarditis, patient may need to be on colchicine or other type of NSAID oral prednisone. Will discuss with nephrology. -Pending further recommendations  HCAP -Chest CT shows bilateral lower lobe opacities, possibly representing pneumonia -Continue coverage, vancomycin and cefepime -Currently afebrile, leukocytosis trending downward  End stage renal disease requiring hemodialysis -Patient is a Tuesday Thursday Saturday dialysis patient -Nephrology consulted -Continue PhosLo  Wegener's disease -Currently not on steroids -Patient will need a followup CT of the chest  Anemia of chronic disease -Hemodynamically stable -Hemoglobin 8.7 -Will continue to monitor  Chronic Systolic CHF with Grade 1 diastolic dysfunction status post ICD implant -Patient currently euvolemic and does not appear to be in exacerbation -Continue hemodialysis for volume control -Echocardiogram: EF 45-50%, diffuse hypokinesis, grade 1 diastolic  dysfunction -Continue daily weights, strict input and output, and fluid restriction  Questionable history of diabetes mellitus -Hemoglobin A1c 4.5  Code Status: Full  Family Communiation: Brother at bedside  Disposition Plan: Admitted  Time Spent in minutes   20 minutes  Procedures  Echocardiogram Study Conclusions - Left ventricle: The cavity size was normal. Wall thickness was normal. Systolic function was mildly reduced. The estimated ejection fraction was in the range of 45% to 50%. Diffuse hypokinesis. Doppler parameters are consistent with abnormal left ventricular relaxation (grade 1 diastolic dysfunction). - Mitral valve: Mild to moderate regurgitation.  Consults   Nephrology Cardiology  DVT Prophylaxis heparin  Lab Results  Component Value Date   PLT 214 12/24/2013    Medications  Scheduled Meds: . aspirin EC  81 mg Oral Daily  . ceFEPime (MAXIPIME) IV  2 g Intravenous Q M,W,F-1800  . heparin  5,000 Units Subcutaneous 3 times per day  . insulin aspart  0-9 Units Subcutaneous TID WC  . multivitamin  1 tablet Oral QHS  . pantoprazole  40 mg Oral Daily  . risperiDONE  0.25 mg Oral QHS  . vancomycin  750 mg Intravenous Q M,W,F-HD   Continuous Infusions:  PRN Meds:.acetaminophen, acetaminophen, albuterol, morphine injection, nitroGLYCERIN, ondansetron (ZOFRAN) IV, ondansetron  Antibiotics    Anti-infectives   Start     Dose/Rate Route Frequency Ordered Stop   12/23/13 1200  vancomycin (VANCOCIN) IVPB 750 mg/150 ml premix  Status:  Discontinued     750 mg 150 mL/hr over 60 Minutes Intravenous Every T-Th-Sa (Hemodialysis) 12/21/13 1424 12/23/13 1036   12/23/13 1200  ceFEPIme (MAXIPIME) 2 g in dextrose 5 % 50 mL IVPB  Status:  Discontinued     2 g 100 mL/hr over 30 Minutes Intravenous Every T-Th-Sa (Hemodialysis) 12/21/13 1424 12/23/13 1036   12/23/13 1045  ceFEPIme (MAXIPIME) 2 g in dextrose 5 % 50 mL IVPB     2 g 100 mL/hr over 30 Minutes Intravenous Every  M-W-F (1800) 12/23/13 1036     12/23/13 1045  vancomycin (VANCOCIN) IVPB 750 mg/150 ml premix     750 mg 150 mL/hr over 60 Minutes Intravenous Every M-W-F (Hemodialysis) 12/23/13 1036     12/21/13 1900  vancomycin (VANCOCIN) IVPB 750 mg/150 ml premix  Status:  Discontinued     750 mg 150 mL/hr over 60 Minutes Intravenous Every T-Th-Sa (Hemodialysis) 12/21/13 1212 12/21/13 1424   12/21/13 1700  ceFEPIme (MAXIPIME) 2 g in dextrose 5 % 50 mL IVPB  Status:  Discontinued     2 g 100 mL/hr over 30 Minutes Intravenous Every T-Th-Sa (Hemodialysis) 12/21/13 1212 12/21/13 1424   12/21/13 1215  vancomycin (VANCOCIN) 1,500 mg in sodium chloride 0.9 % 500 mL IVPB     1,500 mg 250 mL/hr over 120 Minutes Intravenous  Once 12/21/13 1212 12/21/13 1504   12/21/13 1215  ceFEPIme (MAXIPIME) 2 g in dextrose 5 % 50 mL IVPB     2 g 100 mL/hr over 30 Minutes Intravenous  Once 12/21/13 1212 12/21/13 1645        Subjective:   James Nicholson seen and examined today.  Patient complains of chest pain however it is worse with his cough which is nonproductive.  His brother is at the bedside and interpreting.    Objective:   Filed Vitals:   12/23/13 1231 12/23/13 1640 12/23/13 2101 12/24/13 0424  BP: 134/80 115/63 104/64 102/70  Pulse: 114 101 106 86  Temp: 98.1 F (36.7 C) 98.6 F (37 C) 98.1 F (36.7 C) 97.7 F (36.5 C)  TempSrc:   Oral Oral  Resp: 18 17 16 16   Height:      Weight:      SpO2: 94% 94% 93% 94%    Wt Readings from Last 3 Encounters:  12/23/13 71.4 kg (157 lb 6.5 oz)  08/09/13 68.04 kg (150 lb)  08/09/13 68.04 kg (150 lb)     Intake/Output Summary (Last 24 hours) at 12/24/13 0755 Last data filed at 12/24/13 0600  Gross per 24 hour  Intake    460 ml  Output      0 ml  Net    460 ml    Exam  General: Well developed, well nourished, NAD, appears stated age  67: NCAT, mucous membranes moist.  Neck: Supple, no JVD, no masses  Cardiovascular: S1 S2 auscultated,   Regular rate and rhythm.   Respiratory: Sounds improved, few rales noted.  Abdomen: Soft, nontender, nondistended, + bowel sounds  Extremities: warm dry without cyanosis clubbing or edema  Neuro: AAOx3, cranial nerves grossly intact. No focal deficits  Skin: Without rashes exudates or nodules  Psych: Appropriate affect and demeanor   Data Review   Micro Results Recent Results (from the past 240 hour(s))  MRSA PCR SCREENING     Status: None   Collection Time    12/21/13  8:57 PM      Result Value Ref Range Status   MRSA  by PCR NEGATIVE  NEGATIVE Final   Comment:            The GeneXpert MRSA Assay (FDA     approved for NASAL specimens     only), is one component of a     comprehensive MRSA colonization     surveillance program. It is not     intended to diagnose MRSA     infection nor to guide or     monitor treatment for     MRSA infections.    Radiology Reports Dg Chest 2 View  12/21/2013   CLINICAL DATA:  Chest pain and shortness of breath.  EXAM: CHEST  2 VIEW  COMPARISON:  PA and lateral chest 07/22/2013.  FINDINGS: The patient has bibasilar airspace disease, worse on the left. Lung volumes are lower than on the comparison study. Heart size is mildly enlarged. No pneumothorax or pleural fluid. Mild elevation of the right hemidiaphragm and AICD are again seen. Dialysis catheter present on the prior study has been removed.  IMPRESSION: Left worse than right basilar airspace disease could be due to atelectasis or pneumonia.   Electronically Signed   By: Inge Rise M.D.   On: 12/21/2013 10:55   Ct Chest Wo Contrast  12/21/2013   CLINICAL DATA:  48 year old male with chest pain and shortness of breath. History of Wegener's granulomatosis.  EXAM: CT CHEST WITHOUT CONTRAST  TECHNIQUE: Multidetector CT imaging of the chest was performed following the standard protocol without IV contrast.  COMPARISON:  04/12/2013 chest CT. 12/21/2013 and prior chest radiographs  FINDINGS:  Respiratory motion artifact in the lower lungs decreases sensitivity slightly limits evaluation.  Cardiomegaly and AICD again noted.  There is no evidence of pleural effusion.  A very small pericardial effusion versus pericardial thickening noted, increased since the prior study.  No enlarged lymph nodes are present.  Bilateral lower lobe opacities may represent atelectasis and/or airspace disease. .  No discrete nodules or mass identified.  The visualized upper abdomen is unremarkable.  No acute or suspicious bony abnormalities are present.  IMPRESSION: Bilateral lower lobe opacities, question atelectasis and/or airspace disease - possibly representing pneumonia.  Cardiomegaly with increasing very small pericardial effusion versus pericardial thickening. Pericarditis is not excluded.   Electronically Signed   By: Hassan Rowan M.D.   On: 12/21/2013 21:35    CBC  Recent Labs Lab 12/21/13 1015 12/22/13 0620 12/23/13 0121 12/24/13 0405  WBC 13.9* 17.0* 12.2* 8.3  HGB 9.6* 9.5* 8.8* 8.7*  HCT 27.2* 26.8* 25.3* 25.4*  PLT 204 201 178 214  MCV 90.1 90.8 91.3 91.4  MCH 31.8 32.2 31.8 31.3  MCHC 35.3 35.4 34.8 34.3  RDW 13.4 13.2 13.2 13.2    Chemistries   Recent Labs Lab 12/21/13 1015 12/22/13 0620 12/23/13 0121  NA 136* 136* 140  K 3.6* 3.9 3.8  CL 92* 96 99  CO2 25 22 28   GLUCOSE 111* 90 95  BUN 55* 60* 26*  CREATININE 7.14* 6.88* 4.02*  CALCIUM 9.3 9.1 8.6  MG  --   --  2.0  AST 13  --   --   ALT 12  --   --   ALKPHOS 67  --   --   BILITOT 0.5  --   --    ------------------------------------------------------------------------------------------------------------------ estimated creatinine clearance is 20.6 ml/min (by C-G formula based on Cr of 4.02). ------------------------------------------------------------------------------------------------------------------  Recent Labs  12/21/13 2010  HGBA1C 4.5    ------------------------------------------------------------------------------------------------------------------  Recent Labs  12/23/13 0121  CHOL 153  HDL 25*  LDLCALC 104*  TRIG 120  CHOLHDL 6.1   ------------------------------------------------------------------------------------------------------------------  Recent Labs  12/23/13 0121  TSH 0.990   ------------------------------------------------------------------------------------------------------------------ No results found for this basename: VITAMINB12, FOLATE, FERRITIN, TIBC, IRON, RETICCTPCT,  in the last 72 hours  Coagulation profile No results found for this basename: INR, PROTIME,  in the last 168 hours  No results found for this basename: DDIMER,  in the last 72 hours  Cardiac Enzymes  Recent Labs Lab 12/22/13 1139 12/22/13 1922 12/23/13 0121  TROPONINI 0.55* 0.42* 0.32*   ------------------------------------------------------------------------------------------------------------------ No components found with this basename: POCBNP,     Carlotta Telfair D.O. on 12/24/2013 at 7:55 AM  Between 7am to 7pm - Pager - [phone]  After 7pm go to [email or web address] - password [email or web address]  And look for the night coverage person covering for me after hours  [facility] Office  [phone]

## 2013-12-24 NOTE — Procedures (Signed)
I was present at this dialysis session, have reviewed the session itself and made  appropriate changes  Kelly Splinter MD (pgr) [phone]    ([phone] 12/24/2013, 1:27 PM

## 2013-12-24 NOTE — Progress Notes (Signed)
[facility] Progress Note  Subjective:   Still some pain with breathing  Objective Filed Vitals:   12/23/13 1231 12/23/13 1640 12/23/13 2101 12/24/13 0424  BP: 134/80 115/63 104/64 102/70  Pulse: 114 101 106 86  Temp: 98.1 F (36.7 C) 98.6 F (37 C) 98.1 F (36.7 C) 97.7 F (36.5 C)  TempSrc:   Oral Oral  Resp: 18 17 16 16   Height:      Weight:      SpO2: 94% 94% 93% 94%   Physical Exam pre HD standing weight 71.3 General: supine - HD being initiated Heart: RRR Lungs:dim BS bases Abdomen: soft NT Extremities: no LE edema; SCDs in place Dialysis Access: RAVF +bruit/thrill - HD being initiated Dialysis Orders: TTS @[facility]   4 hr 68kgs 2K/2.25Ca 180 400/800 [id number] RAVF  calictriol [id number] mcg/HD Epogen 1000U IV/HD Venofer 50 qweek   Assessment/Plan:  1. PNA- afebrile. WBC down to 8.3 on Vanc and Maxipime Chest xray/CT- possible PNA 2. prob type II NSTEMI from demand ischemia- elevated troponin (0.63--> now 0.32)- rec per Dr. Radford Pax; EF 45 - 40%; no pericardial effusion; off tele 3. ESRD - TTS off schedule - HD today - can decide tomorrow whether to dialyze Sat or wait until Monday. 4. Hypertension/volume - controlled No BP meds - 3.3 above EDW - use crit line to titrate 5. Anemia -hgb 9.5 > 8.7, cont esa and iron (last tsat- 36) watch CBC - received 12.5 Aranesp on Wed 2/25 - will need to reorder for next dose 6. Metabolic bone disease - Ca+ 8.6 Phos 2.4 hold phoslo. Cont calcitriol 7. Nutrition - alb 3.6 renal diet. Multivit; added bid nepro  Myriam Jacobson, PA-C [place] [phone] 12/24/2013,8:19 AM  LOS: 3 days   I have seen and examined patient, discussed with PA and agree with assessment and plan as outlined above. Kelly Splinter MD pager [phone]    cell [phone] 12/24/2013, 1:26 PM    Additional Objective Labs: Basic Metabolic Panel:  Recent Labs Lab 12/21/13 1015 12/22/13 0620 12/23/13 0121  NA 136* 136* 140   K 3.6* 3.9 3.8  CL 92* 96 99  CO2 25 22 28   GLUCOSE 111* 90 95  BUN 55* 60* 26*  CREATININE 7.14* 6.88* 4.02*  CALCIUM 9.3 9.1 8.6  PHOS  --   --  2.4   Liver Function Tests:  Recent Labs Lab 12/21/13 1015  AST 13  ALT 12  ALKPHOS 67  BILITOT 0.5  PROT 7.3  ALBUMIN 3.6   Recent Labs Lab 12/21/13 1015 12/22/13 0620 12/23/13 0121 12/24/13 0405  WBC 13.9* 17.0* 12.2* 8.3  HGB 9.6* 9.5* 8.8* 8.7*  HCT 27.2* 26.8* 25.3* 25.4*  MCV 90.1 90.8 91.3 91.4  PLT 204 201 178 214  Cardiac Enzymes:  Recent Labs Lab 12/22/13 12/22/13 0620 12/22/13 1139 12/22/13 1922 12/23/13 0121  TROPONINI <0.30 0.63* 0.55* 0.42* 0.32*   CBG:  Recent Labs Lab 12/22/13 2212 12/23/13 0716 12/23/13 1113 12/23/13 1639 12/23/13 2107  GLUCAP 122* 90 82 110* 124*  Medications:   . aspirin EC  81 mg Oral Daily  . ceFEPime (MAXIPIME) IV  2 g Intravenous Q M,W,F-1800  . heparin  2,000 Units Dialysis Once in dialysis  . heparin  5,000 Units Subcutaneous 3 times per day  . insulin aspart  0-9 Units Subcutaneous TID WC  . multivitamin  1 tablet Oral QHS  . pantoprazole  40 mg Oral Daily  . risperiDONE  0.25 mg Oral  QHS  . vancomycin  750 mg Intravenous Q M,W,F-HD

## 2013-12-24 NOTE — Clinical Social Work Psychosocial (Signed)
Clinical Social Work Department BRIEF PSYCHOSOCIAL ASSESSMENT 12/24/2013  Patient:  James Nicholson, James Nicholson     Account Number:  [id number]     Admit date:  12/21/2013  Clinical Social Worker:  Frederico Hamman  Date/Time:  12/24/2013 06:16 AM  Referred by:  Physician  Date Referred:  12/24/2013 Referred for  ALF Placement   Other Referral:   Interview type:  Other - See comment Other interview type:   CSW talked with Gerald Stabs [phone]), staff person at [facility] on [address]. Manager is Nile Riggs. Phone number is [phone].    PSYCHOSOCIAL DATA Living Status:  FACILITY Admitted from facility:  OTHER Level of care:  Assisted Living Primary support name:  Darell Grahl Primary support relationship to patient:  SIBLING Degree of support available:   Brothers phone number is [phone]    CURRENT CONCERNS Current Concerns  Post-Acute Placement   Other Concerns:    SOCIAL WORK ASSESSMENT / PLAN Patient is from an [facility]. CSW talked with staff member Gerald Stabs [phone]) regarding patient returning and paperwork needed prior to patient coming to facility. Per Gerald Stabs their fax machine is broken, however paperwork can accompany patient. Per Gerald Stabs, his brother will transport.   Assessment/plan status:  No Further Intervention Required Other assessment/ plan:   Information/referral to community resources:   None needed or requested at this time.    PATIENT'S/FAMILY'S RESPONSE TO PLAN OF CARE: Gerald Stabs initially informed that patient ready for discharge today, 227, then later contacted and advised that patient will be ready for discharge on Saturday, 12/25/13. Per Gerald Stabs patient can return.

## 2013-12-24 NOTE — Progress Notes (Signed)
Subjective: Still with chest pain, more with movement  Objective: Vital signs in last 24 hours: Temp:  [97.7 F (36.5 C)-98.6 F (37 C)] 98.5 F (36.9 C) (02/27 0831) Pulse Rate:  [79-114] 92 (02/27 0930) Resp:  [16-18] 18 (02/27 0831) BP: (102-134)/(63-80) 109/78 mmHg (02/27 0930) SpO2:  [93 %-98 %] 97 % (02/27 0831) Weight:  [157 lb 3 oz (71.3 kg)-157 lb 6.5 oz (71.4 kg)] 157 lb 3 oz (71.3 kg) (02/27 0811) Weight change: 5 lb 15.2 oz (2.7 kg) Last BM Date: 12/21/13 Intake/Output from previous day: +460  Wt 157.3 down from 161 02/26 0701 - 02/27 0700 In: 460 [P.O.:460] Out: -  Intake/Output this shift:    PE: General:Pleasant affect, NAD Skin:Warm and dry, brisk capillary refill HEENT:normocephalic, sclera clear, mucus membranes moist Neck:supple, no JVD, no bruits  Heart:S1S2 RRR without murmur, gallup, rub or click Lungs:clear, ant  without rales, rhonchi, or wheezes [id number], non tender, + BS, do not palpate liver spleen or masses Ext:no lower ext edema, 2+ pedal pulses, 2+ radial pulses Neuro:alert and oriented, MAE, follows commands, + facial symmetry  no monitoring   Lab Results:  Recent Labs  12/23/13 0121 12/24/13 0405  WBC 12.2* 8.3  HGB 8.8* 8.7*  HCT 25.3* 25.4*  PLT 178 214   BMET  Recent Labs  12/23/13 0121 12/24/13 0859  NA 140 140  K 3.8 3.9  CL 99 100  CO2 28 26  GLUCOSE 95 91  BUN 26* 44*  CREATININE 4.02* 5.55*  CALCIUM 8.6 9.0    Recent Labs  12/22/13 1922 12/23/13 0121  TROPONINI 0.42* 0.32*    Lab Results  Component Value Date   CHOL 153 12/23/2013   HDL 25* 12/23/2013   LDLCALC 104* 12/23/2013   TRIG 120 12/23/2013   CHOLHDL 6.1 12/23/2013   Lab Results  Component Value Date   HGBA1C 4.5 12/21/2013     Lab Results  Component Value Date   TSH 0.990 12/23/2013    Hepatic Function Panel  Recent Labs  12/21/13 1015 12/24/13 0859  PROT 7.3  --   ALBUMIN 3.6 2.5*  AST 13  --   ALT 12  --   ALKPHOS  67  --   BILITOT 0.5  --     Recent Labs  12/23/13 0121  CHOL 153   No results found for this basename: PROTIME,  in the last 72 hours     Studies/Results: 2D Echo:  - Left ventricle: The cavity size was normal. Wall thickness was normal. Systolic function was mildly reduced. The estimated ejection fraction was in the range of 45% to 50%. Diffuse hypokinesis. Doppler parameters are consistent with abnormal left ventricular relaxation (grade 1 diastolic dysfunction). - Mitral valve: Mild to moderate regurgitation  Medications: I have reviewed the patient's current medications. Scheduled Meds: . aspirin EC  81 mg Oral Daily  . ceFEPime (MAXIPIME) IV  2 g Intravenous Q M,W,F-1800  . feeding supplement (NEPRO CARB STEADY)  237 mL Oral BID BM  . heparin  2,000 Units Dialysis Once in dialysis  . heparin  5,000 Units Subcutaneous 3 times per day  . insulin aspart  0-9 Units Subcutaneous TID WC  . multivitamin  1 tablet Oral QHS  . pantoprazole  40 mg Oral Daily  . risperiDONE  0.25 mg Oral QHS  . vancomycin  1,500 mg Intravenous To Hemo  . [START ON 12/27/2013] vancomycin  750 mg Intravenous Q M,W,F-HD  Continuous Infusions:  PRN Meds:.sodium chloride, sodium chloride, acetaminophen, acetaminophen, albuterol, alteplase, heparin, lidocaine (PF), lidocaine-prilocaine, morphine injection, nitroGLYCERIN, ondansetron (ZOFRAN) IV, ondansetron, pentafluoroprop-tetrafluoroeth  Assessment/Plan: Principal Problem:   HCAP (healthcare-associated pneumonia) Active Problems:   Hypertension   Automatic implantable cardioverter-defibrillator in situ   Wegener's disease, pulmonary   End stage renal disease   Troponin level elevated  PLAN: no pericardial effusion seen on echo, sm. Pericardial effusion on ct. Troponin elevated. RBBB-old. Stress test tomorrow?  EF improved from 2010 from 40-45 to 45-50%.  Still with pain though appears comfortable.      LOS: 3 days   Time spent with pt.  :15 minutes. [facility]  Nurse Practitioner Certified Pager [phone] or after 5pm and on weekends call [phone] 12/24/2013, 10:04 AM   History and all data above reviewed.  Still with chest pain but reproducible with palpation and deep breathing  Patient examined.  I agree with the findings as above.  The patient exam reveals COR:RRR  ,  Lungs: Clear  ,  Abd: Positive bowel sounds, no rebound no guarding, Ext No edema  .  All available labs, radiology testing, previous records reviewed. Agree with documented assessment and plan. Chest pain is not anginal.  No pericardial effusion.  I would not suggest a Lexiscan Myoview.  Discussed with primary MD.  No evidence of pericarditis.  Dr. Ree Kida will consider other therapies.   James Nicholson  12:11 PM  12/24/2013

## 2013-12-25 DIAGNOSIS — F209 Schizophrenia, unspecified: Secondary | ICD-10-CM | POA: Diagnosis not present

## 2013-12-25 DIAGNOSIS — Z95810 Presence of automatic (implantable) cardiac defibrillator: Secondary | ICD-10-CM | POA: Diagnosis not present

## 2013-12-25 DIAGNOSIS — J189 Pneumonia, unspecified organism: Secondary | ICD-10-CM | POA: Diagnosis not present

## 2013-12-25 DIAGNOSIS — I219 Acute myocardial infarction, unspecified: Secondary | ICD-10-CM | POA: Diagnosis not present

## 2013-12-25 DIAGNOSIS — N186 End stage renal disease: Secondary | ICD-10-CM | POA: Diagnosis not present

## 2013-12-25 DIAGNOSIS — E872 Acidosis, unspecified: Secondary | ICD-10-CM

## 2013-12-25 DIAGNOSIS — D649 Anemia, unspecified: Secondary | ICD-10-CM | POA: Diagnosis not present

## 2013-12-25 LAB — BASIC METABOLIC PANEL
BUN: 33 mg/dL — ABNORMAL HIGH (ref 6–23)
CO2: 27 mEq/L (ref 19–32)
Calcium: 9.1 mg/dL (ref 8.4–10.5)
Chloride: 96 mEq/L (ref 96–112)
Creatinine, Ser: 4.44 mg/dL — ABNORMAL HIGH (ref 0.50–1.35)
GFR calc Af Amer: 17 mL/min — ABNORMAL LOW (ref >90)
GFR calc non Af Amer: 14 mL/min — ABNORMAL LOW (ref >90)
Glucose, Bld: 107 mg/dL — ABNORMAL HIGH (ref 70–99)
Potassium: 3.7 mEq/L (ref 3.7–5.3)
Sodium: 140 mEq/L (ref 137–147)

## 2013-12-25 LAB — CBC
HCT: 28.1 % — ABNORMAL LOW (ref 39.0–52.0)
Hemoglobin: 9.6 g/dL — ABNORMAL LOW (ref 13.0–17.0)
MCH: 31.2 pg (ref 26.0–34.0)
MCHC: 34.2 g/dL (ref 30.0–36.0)
MCV: 91.2 fL (ref 78.0–100.0)
Platelets: 264 10*3/uL (ref 150–400)
RBC: 3.08 MIL/uL — ABNORMAL LOW (ref 4.22–5.81)
RDW: 13.2 % (ref 11.5–15.5)
WBC: 5.9 10*3/uL (ref 4.0–10.5)

## 2013-12-25 LAB — GLUCOSE, CAPILLARY
Glucose-Capillary: 95 mg/dL (ref 70–99)
Glucose-Capillary: 123 mg/dL — ABNORMAL HIGH (ref 70–99)

## 2013-12-25 MED ORDER — LEVOFLOXACIN 500 MG PO TABS: 500.0000 mg | ORAL_TABLET | ORAL | Status: AC

## 2013-12-25 MED ORDER — NEPRO/CARBSTEADY PO LIQD: 237.0000 mL | Freq: Two times a day (BID) | ORAL | Status: DC

## 2013-12-25 MED ORDER — NITROGLYCERIN 0.4 MG SL SUBL
SUBLINGUAL_TABLET | SUBLINGUAL | Status: AC
  Filled 2013-12-25: qty 25

## 2013-12-25 MED ORDER — ASPIRIN 81 MG PO TBEC: 81.0000 mg | DELAYED_RELEASE_TABLET | Freq: Every day | ORAL | Status: DC

## 2013-12-25 NOTE — Progress Notes (Signed)
Weekend CSW confirmed patient return to [facility] group home today. CSW spoke to staff member Richardson Landry [phone]. Per staff member, brother Hartley Barefoot will be transporting patient [phone]. CSW will continue to follow for discharge planning back to group home.  Tilden Fossa, MSW, [place] Clinical Social Worker [facility] Emergency Dept. [phone]

## 2013-12-25 NOTE — Progress Notes (Signed)
[facility] Progress Note  Subjective:   No c/o  Objective Filed Vitals:   12/24/13 1706 12/24/13 2040 12/25/13 0450 12/25/13 0853  BP: 101/61 92/64 97/63  111/57  Pulse: 70 101 77 94  Temp: 98.2 F (36.8 C) 98.2 F (36.8 C) 97.8 F (36.6 C) 98.3 F (36.8 C)  TempSrc: Oral Oral Oral Oral  Resp: 15 20 12 18   Height:      Weight:      SpO2: 97% 96% 98% 99%   Physical Exam General: breathing easily on room air Heart: RRR Lungs: dry crackles at bases Abdomen: soft NT Extremities: no LE edema Dialysis Access:  Right AVF + bruit  Dialysis Orders: TTS @[facility]   4 hr 68kgs 2K/2.25Ca 180 400/800 [id number] RAVF  calictriol [id number] mcg/HD Epogen 1000U IV/HD Venofer 50 qweek   Assessment/Plan:  1. PNA- afebrile. WBC down to 8.3 on Vanc and Maxipime Chest xray/CT- possible PNA - d/c on levaquin 2. Prob type II NSTEMI from demand ischemia- elevated troponin (0.63--> now 0.32)- rec per Dr. Radford Pax; EF 45 - 40%; no pericardial effusion; off tele 3. ESRD - TTS off schedule - HD today 2.5 hours to get back on schedule prior to d/c. 4. Hypertension/volume - controlled No BP meds - UF 2.7 Friday - still above EDW - goal 1 - 1.5 today per crit line 5. Anemia -hgb 9.5 > 8.7>9.6, cont esa and iron (last tsat- 36) watch CBC - received 12.5 Aranesp on Wed 2/25 - continue Epo at d/c titrtate dose 6. Metabolic bone disease - Ca+ 8.6 Phos 2.8 held phoslo - to be resumed at D/c 1 dose at 5 pm Cont calcitriol 7. Nutrition - alb 3.6 renal diet. Multivit; added bid nepro  Myriam Jacobson, PA-C [place] [phone] 12/25/2013,10:54 AM  LOS: 4 days   I have seen and examined patient, discussed with PA and agree with assessment and plan as outlined above. Kelly Splinter MD pager [phone]    cell [phone] 12/25/2013, 12:35 PM    Additional Objective Labs: Basic Metabolic Panel:  Recent Labs Lab 12/22/13 0620 12/23/13 0121 12/24/13 0859  NA 136* 140 140  K  3.9 3.8 3.9  CL 96 99 100  CO2 22 28 26   GLUCOSE 90 95 91  BUN 60* 26* 44*  CREATININE 6.88* 4.02* 5.55*  CALCIUM 9.1 8.6 9.0  PHOS  --  2.4 2.8   Liver Function Tests:  Recent Labs Lab 12/21/13 1015 12/24/13 0859  AST 13  --   ALT 12  --   ALKPHOS 67  --   BILITOT 0.5  --   PROT 7.3  --   ALBUMIN 3.6 2.5*   CBC:  Recent Labs Lab 12/21/13 1015 12/22/13 0620 12/23/13 0121 12/24/13 0405 12/25/13 0451  WBC 13.9* 17.0* 12.2* 8.3 5.9  HGB 9.6* 9.5* 8.8* 8.7* 9.6*  HCT 27.2* 26.8* 25.3* 25.4* 28.1*  MCV 90.1 90.8 91.3 91.4 91.2  PLT 204 201 178 214 264  Cardiac Enzymes:  Recent Labs Lab 12/22/13 12/22/13 0620 12/22/13 1139 12/22/13 1922 12/23/13 0121  TROPONINI <0.30 0.63* 0.55* 0.42* 0.32*   CBG:  Recent Labs Lab 12/24/13 0747 12/24/13 1310 12/24/13 1658 12/24/13 2038 12/25/13 0747  GLUCAP 100* 116* 113* 118* 95  Medications:   . aspirin EC  81 mg Oral Daily  . ceFEPime (MAXIPIME) IV  2 g Intravenous Q M,W,F-1800  . feeding supplement (NEPRO CARB STEADY)  237 mL Oral BID BM  . heparin  5,000 Units  Subcutaneous 3 times per day  . insulin aspart  0-9 Units Subcutaneous TID WC  . multivitamin  1 tablet Oral QHS  . pantoprazole  40 mg Oral Daily  . risperiDONE  0.25 mg Oral QHS  . [START ON 12/27/2013] vancomycin  750 mg Intravenous Q M,W,F-HD

## 2013-12-25 NOTE — Procedures (Signed)
I was present at this dialysis session, have reviewed the session itself and made  appropriate changes  Kelly Splinter MD (pgr) [phone]    [phone] 12/25/2013, 12:36 PM

## 2013-12-25 NOTE — Progress Notes (Signed)
CSW informed that patient ready for discharge. CSW contacted brother who states that he cannot transport patient this afternoon, as he is out of town at a funeral. [facility] contacted facility, which cannot provide transportation over the weekend.   CSW arranged transportation through [facility] [phone]). Patient returning to [facility]  Discharge packet on patient's chart. CSW signing off.  Tilden Fossa, MSW, [facility] [phone]

## 2013-12-25 NOTE — Progress Notes (Signed)
12/25/13 1541 nsg Report given  to [facility]. Per social work pt will be transported by [facility].

## 2013-12-25 NOTE — Discharge Instructions (Signed)
Pneumonia, Adult °Pneumonia is an infection of the lungs. It may be caused by a germ (virus or bacteria). Some types of pneumonia can spread easily from person to person. This can happen when you cough or sneeze. °HOME CARE °· Only take medicine as told by your doctor. °· Take your medicine (antibiotics) as told. Finish it even if you start to feel better. °· Do not smoke. °· You may use a vaporizer or humidifier in your room. This can help loosen thick spit (mucus). °· Sleep so you are almost sitting up (semi-upright). This helps reduce coughing. °· Rest. °A shot (vaccine) can help prevent pneumonia. Shots are often advised for: °· People over 65 years old. °· Patients on chemotherapy. °· People with long-term (chronic) lung problems. °· People with immune system problems. °GET HELP RIGHT AWAY IF:  °· You are getting worse. °· You cannot control your cough, and you are losing sleep. °· You cough up blood. °· Your pain gets worse, even with medicine. °· You have a fever. °· Any of your problems are getting worse, not better. °· You have shortness of breath or chest pain. °MAKE SURE YOU:  °· Understand these instructions. °· Will watch your condition. °· Will get help right away if you are not doing well or get worse. °Document Released: 04/01/2008 Document Revised: 01/06/2012 Document Reviewed: 01/04/2011 °ExitCare® Patient Information ©2014 [employer]. ° °

## 2013-12-25 NOTE — Discharge Summary (Signed)
Physician Discharge Summary  James Nicholson [id number] DOB: [birth year] DOA: 12/21/2013  PCP: [name], MD  Admit date: 12/21/2013 Discharge date: 12/25/2013  Time spent: 35 minutes  Recommendations for Outpatient Follow-up:  Patient will be discharged to an assisted-living. He should continue his dialysis as scheduled. He should also follow up with his primary care physician within one week of discharge. Patient to continue his medications as prescribed. Patient may need follow CT chest for his Wegener's.   Discharge Diagnoses:  NSTEMI, type II Questionable pericarditis on CT, ruled out by echocardiogram Healthcare associated pneumonia End stage renal disease requiring hemodialysis Partners disease Anemia of chronic disease Chronic systolic CHF with grade 1 diastolic dysfunction status post ICD placement Diabetes mellitus  Discharge Condition: Stable  Diet recommendation: Heart healthy, renal healthy with 1200 mL fluid restriction per day  Filed Weights   12/23/13 1011 12/24/13 0811 12/24/13 1235  Weight: 71.4 kg (157 lb 6.5 oz) 71.3 kg (157 lb 3 oz) 68.8 kg (151 lb 10.8 oz)    History of present illness:  James Nicholson is a 48 y.o. male with PMH of Wegener's Dz complicated with Renal failure (H/o biopsy proven P-ANCA positive necrotizing glomerulonephritis) treated with cytoxan and steroids in 2010 in [place], Hx of V-Fib arrest, CHF s/p ICD implant, Hx of Schizophrenia presented with SOB, mild productive cough, chills from NH found to have HCAP: denies hemoptysis, no exertional chest pain,. he also reports some epigastric pain, no nausea, vomiting or diarrhea    Hospital Course:  NSTEMI, type 2  -Patient had last troponin 0.32, trended downward  -Patient still complains of chest pain however associated with coughing  -Echocardiogram: EF 45-50%, diffuse hypokinesis, grade 1 diastolic dysfunction  -Possibly secondary to demand ischemia, no history of heart  myopathy in the setting of renal failure and questionable pericarditis  -Cardiology consulted and no further intervention at this time. -Lipid panel: 153/120/25/104   Questionable pericarditis  -Pericarditis cannot rule out CT chest.  -2-D echocardiogram did not mention pericarditis  -Cardiology did not feel this to be pericarditis   HCAP  -Chest CT shows bilateral lower lobe opacities, possibly representing pneumonia  -Was initially placed on vancomycin and cefepime with dialysis.  -Will be discharged with Levaquin to be continued with dialysis days through 12/29/13 -Currently afebrile, leukocytosis trended downward   End stage renal disease requiring hemodialysis  -Patient is a Tuesday Thursday Saturday dialysis patient  -Nephrology consulted  -Continue PhosLo   Wegener's disease  -Currently not on steroids  -Patient will need a followup CT of the chest   Anemia of chronic disease  -Hemodynamically stable  -Hemoglobin 9.6 -Will continue to monitor   Chronic Systolic CHF with Grade 1 diastolic dysfunction status post ICD implant  -Patient currently euvolemic and does not appear to be in exacerbation  -Continue hemodialysis for volume control  -Echocardiogram: EF 45-50%, diffuse hypokinesis, grade 1 diastolic dysfunction  -Continue daily weights, strict input and output, and fluid restriction   Questionable history of diabetes mellitus  -Hemoglobin A1c 4.5 -Continue home insulin regimen. -Discussed her primary care physician and possibly changing regimen.  Procedures: Echocardiogram  Study Conclusions - Left ventricle: The cavity size was normal. Wall thickness was normal. Systolic function was mildly reduced. The estimated ejection fraction was in the range of 45% to 50%. Diffuse hypokinesis. Doppler parameters are consistent with abnormal left ventricular relaxation (grade 1 diastolic dysfunction). - Mitral valve: Mild to moderate  regurgitation.  Consultations: Nephrology Cardiology  Discharge Exam: Filed Vitals:   12/25/13  0853  BP: 111/57  Pulse: 94  Temp: 98.3 F (36.8 C)  Resp: 18   Exam  General: Well developed, well nourished, NAD, appears stated age  HEENT: NCAT, mucous membranes moist.  Neck: Supple, no JVD, no masses  Cardiovascular: S1 S2 auscultated, Regular rate and rhythm.  Respiratory: Sounds improved, few rales noted.  Abdomen: Soft, nontender, nondistended, + bowel sounds  Extremities: warm dry without cyanosis clubbing or edema  Neuro: AAOx3, cranial nerves grossly intact. No focal deficits  Skin: Without rashes exudates or nodules  Psych: Appropriate affect and demeanor  Discharge Instructions      Discharge Orders   Future Orders Complete By Expires   Diet - low sodium heart healthy  As directed    Discharge instructions  As directed    Comments:     Patient will be discharged to an assisted-living. He should continue his dialysis as scheduled. He should also follow up with his primary care physician within one week of discharge. Patient to continue his medications as prescribed.   Increase activity slowly  As directed        Medication List         aspirin 81 MG EC tablet  Take 1 tablet (81 mg total) by mouth daily.     calcium acetate 667 MG capsule  Commonly known as:  PHOSLO  Take 667 mg by mouth every evening. At 5 pm     feeding supplement (NEPRO CARB STEADY) Liqd  Take 237 mLs by mouth 2 (two) times daily between meals.     insulin detemir 100 UNIT/ML injection  Commonly known as:  LEVEMIR  Inject 0.05 mLs (5 Units total) into the skin at bedtime.     levofloxacin 500 MG tablet  Commonly known as:  LEVAQUIN  Take 1 tablet (500 mg total) by mouth every Monday, Wednesday, and Friday with hemodialysis.  Start taking on:  12/27/2013     multivitamin with minerals Tabs tablet  Take 1 tablet by mouth daily.     omeprazole 20 MG capsule  Commonly known as:   PRILOSEC  Take 20 mg by mouth daily.     risperiDONE 0.25 MG tablet  Commonly known as:  RISPERDAL  Take 0.25 mg by mouth at bedtime.       No Known Allergies Follow-up Information   Follow up with [name],[name], MD. Schedule an appointment as soon as possible for a visit in 1 week. [facility] followup)    Specialty:  Internal Medicine   Contact information:   [id number] [place]  [phone]        The results of significant diagnostics from this hospitalization (including imaging, microbiology, ancillary and laboratory) are listed below for reference.    Significant Diagnostic Studies: Dg Chest 2 View  12/21/2013   CLINICAL DATA:  Chest pain and shortness of breath.  EXAM: CHEST  2 VIEW  COMPARISON:  PA and lateral chest 07/22/2013.  FINDINGS: The patient has bibasilar airspace disease, worse on the left. Lung volumes are lower than on the comparison study. Heart size is mildly enlarged. No pneumothorax or pleural fluid. Mild elevation of the right hemidiaphragm and AICD are again seen. Dialysis catheter present on the prior study has been removed.  IMPRESSION: Left worse than right basilar airspace disease could be due to atelectasis or pneumonia.   Electronically Signed   By: Inge Rise M.D.   On: 12/21/2013 10:55   Ct Chest Wo Contrast  12/21/2013  CLINICAL DATA:  48 year old male with chest pain and shortness of breath. History of Wegener's granulomatosis.  EXAM: CT CHEST WITHOUT CONTRAST  TECHNIQUE: Multidetector CT imaging of the chest was performed following the standard protocol without IV contrast.  COMPARISON:  04/12/2013 chest CT. 12/21/2013 and prior chest radiographs  FINDINGS: Respiratory motion artifact in the lower lungs decreases sensitivity slightly limits evaluation.  Cardiomegaly and AICD again noted.  There is no evidence of pleural effusion.  A very small pericardial effusion versus pericardial thickening noted, increased  since the prior study.  No enlarged lymph nodes are present.  Bilateral lower lobe opacities may represent atelectasis and/or airspace disease. .  No discrete nodules or mass identified.  The visualized upper abdomen is unremarkable.  No acute or suspicious bony abnormalities are present.  IMPRESSION: Bilateral lower lobe opacities, question atelectasis and/or airspace disease - possibly representing pneumonia.  Cardiomegaly with increasing very small pericardial effusion versus pericardial thickening. Pericarditis is not excluded.   Electronically Signed   By: Hassan Rowan M.D.   On: 12/21/2013 21:35    Microbiology: Recent Results (from the past 240 hour(s))  MRSA PCR SCREENING     Status: None   Collection Time    12/21/13  8:57 PM      Result Value Ref Range Status   MRSA by PCR NEGATIVE  NEGATIVE Final   Comment:            The GeneXpert MRSA Assay (FDA     approved for NASAL specimens     only), is one component of a     comprehensive MRSA colonization     surveillance program. It is not     intended to diagnose MRSA     infection nor to guide or     monitor treatment for     MRSA infections.     Labs: Basic Metabolic Panel:  Recent Labs Lab 12/21/13 1015 12/22/13 0620 12/23/13 0121 12/24/13 0859  NA 136* 136* 140 140  K 3.6* 3.9 3.8 3.9  CL 92* 96 99 100  CO2 25 22 28 26   GLUCOSE 111* 90 95 91  BUN 55* 60* 26* 44*  CREATININE 7.14* 6.88* 4.02* 5.55*  CALCIUM 9.3 9.1 8.6 9.0  MG  --   --  2.0  --   PHOS  --   --  2.4 2.8   Liver Function Tests:  Recent Labs Lab 12/21/13 1015 12/24/13 0859  AST 13  --   ALT 12  --   ALKPHOS 67  --   BILITOT 0.5  --   PROT 7.3  --   ALBUMIN 3.6 2.5*   No results found for this basename: LIPASE, AMYLASE,  in the last 168 hours No results found for this basename: AMMONIA,  in the last 168 hours CBC:  Recent Labs Lab 12/21/13 1015 12/22/13 0620 12/23/13 0121 12/24/13 0405 12/25/13 0451  WBC 13.9* 17.0* 12.2* 8.3 5.9   HGB 9.6* 9.5* 8.8* 8.7* 9.6*  HCT 27.2* 26.8* 25.3* 25.4* 28.1*  MCV 90.1 90.8 91.3 91.4 91.2  PLT 204 201 178 214 264   Cardiac Enzymes:  Recent Labs Lab 12/22/13 12/22/13 0620 12/22/13 1139 12/22/13 1922 12/23/13 0121  TROPONINI <0.30 0.63* 0.55* 0.42* 0.32*   BNP: BNP (last 3 results) No results found for this basename: PROBNP,  in the last 8760 hours CBG:  Recent Labs Lab 12/24/13 0747 12/24/13 1310 12/24/13 1658 12/24/13 2038 12/25/13 0747  GLUCAP 100* 116* 113* 118* 95  SignedCristal Ford  [facility] 12/25/2013, 10:13 AM

## 2013-12-28 DIAGNOSIS — E119 Type 2 diabetes mellitus without complications: Secondary | ICD-10-CM | POA: Diagnosis not present

## 2013-12-28 DIAGNOSIS — D509 Iron deficiency anemia, unspecified: Secondary | ICD-10-CM | POA: Diagnosis not present

## 2013-12-28 DIAGNOSIS — D631 Anemia in chronic kidney disease: Secondary | ICD-10-CM | POA: Diagnosis not present

## 2013-12-28 DIAGNOSIS — N186 End stage renal disease: Secondary | ICD-10-CM | POA: Diagnosis not present

## 2013-12-28 DIAGNOSIS — N2581 Secondary hyperparathyroidism of renal origin: Secondary | ICD-10-CM | POA: Diagnosis not present

## 2013-12-28 DIAGNOSIS — Z23 Encounter for immunization: Secondary | ICD-10-CM | POA: Diagnosis not present

## 2014-01-07 DIAGNOSIS — T7840XA Allergy, unspecified, initial encounter: Secondary | ICD-10-CM | POA: Diagnosis not present

## 2014-01-07 DIAGNOSIS — E213 Hyperparathyroidism, unspecified: Secondary | ICD-10-CM | POA: Diagnosis not present

## 2014-01-07 DIAGNOSIS — K219 Gastro-esophageal reflux disease without esophagitis: Secondary | ICD-10-CM | POA: Diagnosis not present

## 2014-01-07 DIAGNOSIS — E785 Hyperlipidemia, unspecified: Secondary | ICD-10-CM | POA: Diagnosis not present

## 2014-01-07 DIAGNOSIS — G63 Polyneuropathy in diseases classified elsewhere: Secondary | ICD-10-CM | POA: Diagnosis not present

## 2014-01-07 DIAGNOSIS — N186 End stage renal disease: Secondary | ICD-10-CM | POA: Diagnosis not present

## 2014-01-07 DIAGNOSIS — E119 Type 2 diabetes mellitus without complications: Secondary | ICD-10-CM | POA: Diagnosis not present

## 2014-01-07 DIAGNOSIS — F209 Schizophrenia, unspecified: Secondary | ICD-10-CM | POA: Diagnosis not present

## 2014-01-25 DIAGNOSIS — N186 End stage renal disease: Secondary | ICD-10-CM | POA: Diagnosis not present

## 2014-01-27 DIAGNOSIS — E119 Type 2 diabetes mellitus without complications: Secondary | ICD-10-CM | POA: Diagnosis not present

## 2014-01-27 DIAGNOSIS — N186 End stage renal disease: Secondary | ICD-10-CM | POA: Diagnosis not present

## 2014-01-27 DIAGNOSIS — K759 Inflammatory liver disease, unspecified: Secondary | ICD-10-CM | POA: Diagnosis not present

## 2014-01-27 DIAGNOSIS — Z23 Encounter for immunization: Secondary | ICD-10-CM | POA: Diagnosis not present

## 2014-01-27 DIAGNOSIS — N039 Chronic nephritic syndrome with unspecified morphologic changes: Secondary | ICD-10-CM | POA: Diagnosis not present

## 2014-01-27 DIAGNOSIS — D509 Iron deficiency anemia, unspecified: Secondary | ICD-10-CM | POA: Diagnosis not present

## 2014-01-27 DIAGNOSIS — D631 Anemia in chronic kidney disease: Secondary | ICD-10-CM | POA: Diagnosis not present

## 2014-01-27 DIAGNOSIS — N2581 Secondary hyperparathyroidism of renal origin: Secondary | ICD-10-CM | POA: Diagnosis not present

## 2014-02-03 DIAGNOSIS — E1129 Type 2 diabetes mellitus with other diabetic kidney complication: Secondary | ICD-10-CM | POA: Diagnosis not present

## 2014-02-04 DIAGNOSIS — F259 Schizoaffective disorder, unspecified: Secondary | ICD-10-CM | POA: Diagnosis not present

## 2014-02-24 DIAGNOSIS — N186 End stage renal disease: Secondary | ICD-10-CM | POA: Diagnosis not present

## 2014-02-26 DIAGNOSIS — K759 Inflammatory liver disease, unspecified: Secondary | ICD-10-CM | POA: Diagnosis not present

## 2014-02-26 DIAGNOSIS — N186 End stage renal disease: Secondary | ICD-10-CM | POA: Diagnosis not present

## 2014-02-26 DIAGNOSIS — Z23 Encounter for immunization: Secondary | ICD-10-CM | POA: Diagnosis not present

## 2014-02-26 DIAGNOSIS — D509 Iron deficiency anemia, unspecified: Secondary | ICD-10-CM | POA: Diagnosis not present

## 2014-02-26 DIAGNOSIS — E1129 Type 2 diabetes mellitus with other diabetic kidney complication: Secondary | ICD-10-CM | POA: Diagnosis not present

## 2014-02-26 DIAGNOSIS — D631 Anemia in chronic kidney disease: Secondary | ICD-10-CM | POA: Diagnosis not present

## 2014-02-26 DIAGNOSIS — E119 Type 2 diabetes mellitus without complications: Secondary | ICD-10-CM | POA: Diagnosis not present

## 2014-02-26 DIAGNOSIS — N039 Chronic nephritic syndrome with unspecified morphologic changes: Secondary | ICD-10-CM | POA: Diagnosis not present

## 2014-02-26 DIAGNOSIS — N2581 Secondary hyperparathyroidism of renal origin: Secondary | ICD-10-CM | POA: Diagnosis not present

## 2014-03-24 ENCOUNTER — Other Ambulatory Visit: Payer: Self-pay | Admitting: Adult Health

## 2014-03-27 DIAGNOSIS — N186 End stage renal disease: Secondary | ICD-10-CM | POA: Diagnosis not present

## 2014-03-29 DIAGNOSIS — N186 End stage renal disease: Secondary | ICD-10-CM | POA: Diagnosis not present

## 2014-03-29 DIAGNOSIS — E1129 Type 2 diabetes mellitus with other diabetic kidney complication: Secondary | ICD-10-CM | POA: Diagnosis not present

## 2014-03-29 DIAGNOSIS — E119 Type 2 diabetes mellitus without complications: Secondary | ICD-10-CM | POA: Diagnosis not present

## 2014-03-29 DIAGNOSIS — D631 Anemia in chronic kidney disease: Secondary | ICD-10-CM | POA: Diagnosis not present

## 2014-03-29 DIAGNOSIS — N2581 Secondary hyperparathyroidism of renal origin: Secondary | ICD-10-CM | POA: Diagnosis not present

## 2014-03-29 DIAGNOSIS — D509 Iron deficiency anemia, unspecified: Secondary | ICD-10-CM | POA: Diagnosis not present

## 2014-04-01 DIAGNOSIS — F209 Schizophrenia, unspecified: Secondary | ICD-10-CM | POA: Diagnosis not present

## 2014-04-01 DIAGNOSIS — E785 Hyperlipidemia, unspecified: Secondary | ICD-10-CM | POA: Diagnosis not present

## 2014-04-01 DIAGNOSIS — E119 Type 2 diabetes mellitus without complications: Secondary | ICD-10-CM | POA: Diagnosis not present

## 2014-04-01 DIAGNOSIS — T7840XA Allergy, unspecified, initial encounter: Secondary | ICD-10-CM | POA: Diagnosis not present

## 2014-04-01 DIAGNOSIS — K219 Gastro-esophageal reflux disease without esophagitis: Secondary | ICD-10-CM | POA: Diagnosis not present

## 2014-04-01 DIAGNOSIS — N186 End stage renal disease: Secondary | ICD-10-CM | POA: Diagnosis not present

## 2014-04-01 DIAGNOSIS — G63 Polyneuropathy in diseases classified elsewhere: Secondary | ICD-10-CM | POA: Diagnosis not present

## 2014-04-20 ENCOUNTER — Encounter: Payer: Medicare Other | Admitting: Internal Medicine

## 2014-04-26 DIAGNOSIS — N186 End stage renal disease: Secondary | ICD-10-CM | POA: Diagnosis not present

## 2014-04-28 DIAGNOSIS — E119 Type 2 diabetes mellitus without complications: Secondary | ICD-10-CM | POA: Diagnosis not present

## 2014-04-28 DIAGNOSIS — E1129 Type 2 diabetes mellitus with other diabetic kidney complication: Secondary | ICD-10-CM | POA: Diagnosis not present

## 2014-04-28 DIAGNOSIS — D631 Anemia in chronic kidney disease: Secondary | ICD-10-CM | POA: Diagnosis not present

## 2014-04-28 DIAGNOSIS — N186 End stage renal disease: Secondary | ICD-10-CM | POA: Diagnosis not present

## 2014-04-28 DIAGNOSIS — K759 Inflammatory liver disease, unspecified: Secondary | ICD-10-CM | POA: Diagnosis not present

## 2014-04-28 DIAGNOSIS — N2581 Secondary hyperparathyroidism of renal origin: Secondary | ICD-10-CM | POA: Diagnosis not present

## 2014-04-28 DIAGNOSIS — D509 Iron deficiency anemia, unspecified: Secondary | ICD-10-CM | POA: Diagnosis not present

## 2014-05-05 DIAGNOSIS — E1129 Type 2 diabetes mellitus with other diabetic kidney complication: Secondary | ICD-10-CM | POA: Diagnosis not present

## 2014-05-27 DIAGNOSIS — N186 End stage renal disease: Secondary | ICD-10-CM | POA: Diagnosis not present

## 2014-05-28 DIAGNOSIS — D631 Anemia in chronic kidney disease: Secondary | ICD-10-CM | POA: Diagnosis not present

## 2014-05-28 DIAGNOSIS — N186 End stage renal disease: Secondary | ICD-10-CM | POA: Diagnosis not present

## 2014-05-28 DIAGNOSIS — N2581 Secondary hyperparathyroidism of renal origin: Secondary | ICD-10-CM | POA: Diagnosis not present

## 2014-05-28 DIAGNOSIS — E119 Type 2 diabetes mellitus without complications: Secondary | ICD-10-CM | POA: Diagnosis not present

## 2014-05-28 DIAGNOSIS — D509 Iron deficiency anemia, unspecified: Secondary | ICD-10-CM | POA: Diagnosis not present

## 2014-06-06 DIAGNOSIS — F259 Schizoaffective disorder, unspecified: Secondary | ICD-10-CM | POA: Diagnosis not present

## 2014-06-27 DIAGNOSIS — N186 End stage renal disease: Secondary | ICD-10-CM | POA: Diagnosis not present

## 2014-06-28 DIAGNOSIS — N186 End stage renal disease: Secondary | ICD-10-CM | POA: Diagnosis not present

## 2014-06-28 DIAGNOSIS — D509 Iron deficiency anemia, unspecified: Secondary | ICD-10-CM | POA: Diagnosis not present

## 2014-06-28 DIAGNOSIS — Z23 Encounter for immunization: Secondary | ICD-10-CM | POA: Diagnosis not present

## 2014-06-28 DIAGNOSIS — N2581 Secondary hyperparathyroidism of renal origin: Secondary | ICD-10-CM | POA: Diagnosis not present

## 2014-06-28 DIAGNOSIS — E119 Type 2 diabetes mellitus without complications: Secondary | ICD-10-CM | POA: Diagnosis not present

## 2014-06-29 ENCOUNTER — Encounter: Payer: Medicare Other | Admitting: Internal Medicine

## 2014-07-12 ENCOUNTER — Encounter: Payer: Self-pay | Admitting: Internal Medicine

## 2014-07-27 DIAGNOSIS — N186 End stage renal disease: Secondary | ICD-10-CM | POA: Diagnosis not present

## 2014-07-28 DIAGNOSIS — D631 Anemia in chronic kidney disease: Secondary | ICD-10-CM | POA: Diagnosis not present

## 2014-07-28 DIAGNOSIS — N186 End stage renal disease: Secondary | ICD-10-CM | POA: Diagnosis not present

## 2014-07-28 DIAGNOSIS — N2581 Secondary hyperparathyroidism of renal origin: Secondary | ICD-10-CM | POA: Diagnosis not present

## 2014-07-28 DIAGNOSIS — K759 Inflammatory liver disease, unspecified: Secondary | ICD-10-CM | POA: Diagnosis not present

## 2014-07-28 DIAGNOSIS — Z23 Encounter for immunization: Secondary | ICD-10-CM | POA: Diagnosis not present

## 2014-07-28 DIAGNOSIS — D509 Iron deficiency anemia, unspecified: Secondary | ICD-10-CM | POA: Diagnosis not present

## 2014-07-28 DIAGNOSIS — E119 Type 2 diabetes mellitus without complications: Secondary | ICD-10-CM | POA: Diagnosis not present

## 2014-08-05 DIAGNOSIS — R51 Headache: Secondary | ICD-10-CM | POA: Diagnosis not present

## 2014-08-05 DIAGNOSIS — E785 Hyperlipidemia, unspecified: Secondary | ICD-10-CM | POA: Diagnosis not present

## 2014-08-05 DIAGNOSIS — M79669 Pain in unspecified lower leg: Secondary | ICD-10-CM | POA: Diagnosis not present

## 2014-08-05 DIAGNOSIS — T7849XA Other allergy, initial encounter: Secondary | ICD-10-CM | POA: Diagnosis not present

## 2014-08-05 DIAGNOSIS — F209 Schizophrenia, unspecified: Secondary | ICD-10-CM | POA: Diagnosis not present

## 2014-08-05 DIAGNOSIS — E1140 Type 2 diabetes mellitus with diabetic neuropathy, unspecified: Secondary | ICD-10-CM | POA: Diagnosis not present

## 2014-08-05 DIAGNOSIS — E1122 Type 2 diabetes mellitus with diabetic chronic kidney disease: Secondary | ICD-10-CM | POA: Diagnosis not present

## 2014-08-05 DIAGNOSIS — N186 End stage renal disease: Secondary | ICD-10-CM | POA: Diagnosis not present

## 2014-08-11 DIAGNOSIS — E1129 Type 2 diabetes mellitus with other diabetic kidney complication: Secondary | ICD-10-CM | POA: Diagnosis not present

## 2014-08-27 DIAGNOSIS — Z992 Dependence on renal dialysis: Secondary | ICD-10-CM | POA: Diagnosis not present

## 2014-08-27 DIAGNOSIS — N186 End stage renal disease: Secondary | ICD-10-CM | POA: Diagnosis not present

## 2014-08-29 DIAGNOSIS — F250 Schizoaffective disorder, bipolar type: Secondary | ICD-10-CM | POA: Diagnosis not present

## 2014-08-30 DIAGNOSIS — E119 Type 2 diabetes mellitus without complications: Secondary | ICD-10-CM | POA: Diagnosis not present

## 2014-08-30 DIAGNOSIS — D631 Anemia in chronic kidney disease: Secondary | ICD-10-CM | POA: Diagnosis not present

## 2014-08-30 DIAGNOSIS — D509 Iron deficiency anemia, unspecified: Secondary | ICD-10-CM | POA: Diagnosis not present

## 2014-08-30 DIAGNOSIS — N2581 Secondary hyperparathyroidism of renal origin: Secondary | ICD-10-CM | POA: Diagnosis not present

## 2014-08-30 DIAGNOSIS — N186 End stage renal disease: Secondary | ICD-10-CM | POA: Diagnosis not present

## 2014-09-26 DIAGNOSIS — N186 End stage renal disease: Secondary | ICD-10-CM | POA: Diagnosis not present

## 2014-09-27 DIAGNOSIS — N186 End stage renal disease: Secondary | ICD-10-CM | POA: Diagnosis not present

## 2014-09-27 DIAGNOSIS — N2581 Secondary hyperparathyroidism of renal origin: Secondary | ICD-10-CM | POA: Diagnosis not present

## 2014-09-27 DIAGNOSIS — E119 Type 2 diabetes mellitus without complications: Secondary | ICD-10-CM | POA: Diagnosis not present

## 2014-09-27 DIAGNOSIS — D509 Iron deficiency anemia, unspecified: Secondary | ICD-10-CM | POA: Diagnosis not present

## 2014-10-06 ENCOUNTER — Encounter (HOSPITAL_COMMUNITY): Payer: Self-pay | Admitting: Vascular Surgery

## 2014-10-27 DIAGNOSIS — Z992 Dependence on renal dialysis: Secondary | ICD-10-CM | POA: Diagnosis not present

## 2014-10-27 DIAGNOSIS — N186 End stage renal disease: Secondary | ICD-10-CM | POA: Diagnosis not present

## 2014-10-30 DIAGNOSIS — N186 End stage renal disease: Secondary | ICD-10-CM | POA: Diagnosis not present

## 2014-10-30 DIAGNOSIS — K759 Inflammatory liver disease, unspecified: Secondary | ICD-10-CM | POA: Diagnosis not present

## 2014-10-30 DIAGNOSIS — E119 Type 2 diabetes mellitus without complications: Secondary | ICD-10-CM | POA: Diagnosis not present

## 2014-10-30 DIAGNOSIS — N2581 Secondary hyperparathyroidism of renal origin: Secondary | ICD-10-CM | POA: Diagnosis not present

## 2014-10-30 DIAGNOSIS — R509 Fever, unspecified: Secondary | ICD-10-CM | POA: Diagnosis not present

## 2014-10-30 DIAGNOSIS — D509 Iron deficiency anemia, unspecified: Secondary | ICD-10-CM | POA: Diagnosis not present

## 2014-11-01 ENCOUNTER — Encounter: Payer: Medicare Other | Admitting: Internal Medicine

## 2014-11-03 ENCOUNTER — Encounter (HOSPITAL_COMMUNITY): Payer: Self-pay | Admitting: Cardiology

## 2014-11-03 ENCOUNTER — Inpatient Hospital Stay (HOSPITAL_COMMUNITY)
Admission: EM | Admit: 2014-11-03 | Discharge: 2014-11-07 | DRG: 871 | Disposition: A | Discharge status: Skilled Nursing Facility | Payer: Medicare Other | Attending: Internal Medicine | Admitting: Internal Medicine

## 2014-11-03 ENCOUNTER — Emergency Department (HOSPITAL_COMMUNITY): Payer: Medicare Other

## 2014-11-03 DIAGNOSIS — I120 Hypertensive chronic kidney disease with stage 5 chronic kidney disease or end stage renal disease: Secondary | ICD-10-CM | POA: Diagnosis present

## 2014-11-03 DIAGNOSIS — D649 Anemia, unspecified: Secondary | ICD-10-CM | POA: Diagnosis present

## 2014-11-03 DIAGNOSIS — M3130 Wegener's granulomatosis without renal involvement: Secondary | ICD-10-CM | POA: Diagnosis not present

## 2014-11-03 DIAGNOSIS — Z7901 Long term (current) use of anticoagulants: Secondary | ICD-10-CM | POA: Diagnosis not present

## 2014-11-03 DIAGNOSIS — E785 Hyperlipidemia, unspecified: Secondary | ICD-10-CM | POA: Diagnosis present

## 2014-11-03 DIAGNOSIS — Z87891 Personal history of nicotine dependence: Secondary | ICD-10-CM | POA: Diagnosis not present

## 2014-11-03 DIAGNOSIS — Z794 Long term (current) use of insulin: Secondary | ICD-10-CM

## 2014-11-03 DIAGNOSIS — I2510 Atherosclerotic heart disease of native coronary artery without angina pectoris: Secondary | ICD-10-CM | POA: Diagnosis present

## 2014-11-03 DIAGNOSIS — R079 Chest pain, unspecified: Secondary | ICD-10-CM

## 2014-11-03 DIAGNOSIS — J189 Pneumonia, unspecified organism: Secondary | ICD-10-CM | POA: Diagnosis not present

## 2014-11-03 DIAGNOSIS — IMO0001 Reserved for inherently not codable concepts without codable children: Secondary | ICD-10-CM

## 2014-11-03 DIAGNOSIS — N186 End stage renal disease: Secondary | ICD-10-CM | POA: Diagnosis not present

## 2014-11-03 DIAGNOSIS — R0602 Shortness of breath: Secondary | ICD-10-CM | POA: Diagnosis not present

## 2014-11-03 DIAGNOSIS — E876 Hypokalemia: Secondary | ICD-10-CM | POA: Diagnosis present

## 2014-11-03 DIAGNOSIS — Z992 Dependence on renal dialysis: Secondary | ICD-10-CM

## 2014-11-03 DIAGNOSIS — N2581 Secondary hyperparathyroidism of renal origin: Secondary | ICD-10-CM | POA: Diagnosis not present

## 2014-11-03 DIAGNOSIS — F209 Schizophrenia, unspecified: Secondary | ICD-10-CM | POA: Diagnosis present

## 2014-11-03 DIAGNOSIS — A419 Sepsis, unspecified organism: Principal | ICD-10-CM | POA: Diagnosis present

## 2014-11-03 DIAGNOSIS — Y95 Nosocomial condition: Secondary | ICD-10-CM | POA: Diagnosis present

## 2014-11-03 DIAGNOSIS — Z95810 Presence of automatic (implantable) cardiac defibrillator: Secondary | ICD-10-CM | POA: Diagnosis not present

## 2014-11-03 DIAGNOSIS — I429 Cardiomyopathy, unspecified: Secondary | ICD-10-CM | POA: Diagnosis present

## 2014-11-03 DIAGNOSIS — R488 Other symbolic dysfunctions: Secondary | ICD-10-CM | POA: Diagnosis not present

## 2014-11-03 DIAGNOSIS — Z7982 Long term (current) use of aspirin: Secondary | ICD-10-CM

## 2014-11-03 DIAGNOSIS — E119 Type 2 diabetes mellitus without complications: Secondary | ICD-10-CM | POA: Diagnosis present

## 2014-11-03 DIAGNOSIS — M6281 Muscle weakness (generalized): Secondary | ICD-10-CM | POA: Diagnosis not present

## 2014-11-03 DIAGNOSIS — I4891 Unspecified atrial fibrillation: Secondary | ICD-10-CM | POA: Diagnosis present

## 2014-11-03 DIAGNOSIS — R000 Tachycardia, unspecified: Secondary | ICD-10-CM | POA: Diagnosis not present

## 2014-11-03 DIAGNOSIS — R05 Cough: Secondary | ICD-10-CM | POA: Diagnosis not present

## 2014-11-03 DIAGNOSIS — R509 Fever, unspecified: Secondary | ICD-10-CM

## 2014-11-03 DIAGNOSIS — J9811 Atelectasis: Secondary | ICD-10-CM | POA: Diagnosis not present

## 2014-11-03 LAB — CBC
HCT: 40.1 % (ref 39.0–52.0)
Hemoglobin: 13.7 g/dL (ref 13.0–17.0)
MCH: 30.9 pg (ref 26.0–34.0)
MCHC: 34.2 g/dL (ref 30.0–36.0)
MCV: 90.5 fL (ref 78.0–100.0)
Platelets: 154 10*3/uL (ref 150–400)
RBC: 4.43 MIL/uL (ref 4.22–5.81)
RDW: 12.7 % (ref 11.5–15.5)
WBC: 8.8 10*3/uL (ref 4.0–10.5)

## 2014-11-03 LAB — BASIC METABOLIC PANEL
Anion gap: 15 (ref 5–15)
BUN: 15 mg/dL (ref 6–23)
CO2: 33 mmol/L — ABNORMAL HIGH (ref 19–32)
Calcium: 8.7 mg/dL (ref 8.4–10.5)
Chloride: 90 mEq/L — ABNORMAL LOW (ref 96–112)
Creatinine, Ser: 4.3 mg/dL — ABNORMAL HIGH (ref 0.50–1.35)
GFR calc Af Amer: 17 mL/min — ABNORMAL LOW (ref >90)
GFR calc non Af Amer: 15 mL/min — ABNORMAL LOW (ref >90)
Glucose, Bld: 123 mg/dL — ABNORMAL HIGH (ref 70–99)
Potassium: 3.2 mmol/L — ABNORMAL LOW (ref 3.5–5.1)
Sodium: 138 mmol/L (ref 135–145)

## 2014-11-03 LAB — COMPREHENSIVE METABOLIC PANEL
ALT: 38 U/L (ref 0–53)
AST: 47 U/L — ABNORMAL HIGH (ref 0–37)
Albumin: 4 g/dL (ref 3.5–5.2)
Alkaline Phosphatase: 53 U/L (ref 39–117)
Anion gap: 16 — ABNORMAL HIGH (ref 5–15)
BUN: 17 mg/dL (ref 6–23)
CO2: 30 mmol/L (ref 19–32)
Calcium: 8.8 mg/dL (ref 8.4–10.5)
Chloride: 92 mEq/L — ABNORMAL LOW (ref 96–112)
Creatinine, Ser: 4.36 mg/dL — ABNORMAL HIGH (ref 0.50–1.35)
GFR calc Af Amer: 17 mL/min — ABNORMAL LOW (ref >90)
GFR calc non Af Amer: 15 mL/min — ABNORMAL LOW (ref >90)
Glucose, Bld: 109 mg/dL — ABNORMAL HIGH (ref 70–99)
Potassium: 3.2 mmol/L — ABNORMAL LOW (ref 3.5–5.1)
Sodium: 138 mmol/L (ref 135–145)
Total Bilirubin: 0.8 mg/dL (ref 0.3–1.2)
Total Protein: 8.3 g/dL (ref 6.0–8.3)

## 2014-11-03 LAB — GLUCOSE, CAPILLARY: Glucose-Capillary: 99 mg/dL (ref 70–99)

## 2014-11-03 LAB — I-STAT CG4 LACTIC ACID, ED
Lactic Acid, Venous: 2.46 mmol/L — ABNORMAL HIGH (ref 0.5–2.2)
Lactic Acid, Venous: 0.97 mmol/L (ref 0.5–2.2)

## 2014-11-03 LAB — I-STAT TROPONIN, ED: Troponin i, poc: 0.03 ng/mL (ref 0.00–0.08)

## 2014-11-03 MED ORDER — INSULIN DETEMIR 100 UNIT/ML ~~LOC~~ SOLN
5.0000 [IU] | Freq: Every day | SUBCUTANEOUS | Status: DC
  Administered 2014-11-04 – 2014-11-06 (×3): 5 [IU] via SUBCUTANEOUS
  Filled 2014-11-03 (×5): qty 0.05

## 2014-11-03 MED ORDER — ADULT MULTIVITAMIN W/MINERALS CH
1.0000 | ORAL_TABLET | Freq: Every day | ORAL | Status: DC
  Administered 2014-11-03 – 2014-11-06 (×4): 1 via ORAL
  Filled 2014-11-03 (×6): qty 1

## 2014-11-03 MED ORDER — CALCIUM ACETATE 667 MG PO CAPS
667.0000 mg | ORAL_CAPSULE | Freq: Every day | ORAL | Status: DC
  Filled 2014-11-03: qty 1

## 2014-11-03 MED ORDER — SODIUM CHLORIDE 0.9 % IV SOLN
INTRAVENOUS | Status: DC
  Administered 2014-11-03: 23:00:00 via INTRAVENOUS

## 2014-11-03 MED ORDER — ACETAMINOPHEN 325 MG PO TABS
650.0000 mg | ORAL_TABLET | Freq: Once | ORAL | Status: AC
  Administered 2014-11-03: 650 mg via ORAL
  Filled 2014-11-03: qty 2

## 2014-11-03 MED ORDER — VANCOMYCIN HCL IN DEXTROSE 1-5 GM/200ML-% IV SOLN
1000.0000 mg | Freq: Once | INTRAVENOUS | Status: AC
  Administered 2014-11-03: 1000 mg via INTRAVENOUS
  Filled 2014-11-03: qty 200

## 2014-11-03 MED ORDER — DEXTROSE 5 % IV SOLN
2.0000 g | INTRAVENOUS | Status: DC
  Administered 2014-11-05: 2 g via INTRAVENOUS
  Filled 2014-11-03 (×2): qty 2

## 2014-11-03 MED ORDER — ACETAMINOPHEN 325 MG PO TABS: 650.0000 mg | ORAL_TABLET | Freq: Four times a day (QID) | ORAL | Status: DC | PRN

## 2014-11-03 MED ORDER — VANCOMYCIN HCL IN DEXTROSE 750-5 MG/150ML-% IV SOLN
750.0000 mg | INTRAVENOUS | Status: DC
  Administered 2014-11-05: 750 mg via INTRAVENOUS
  Filled 2014-11-03 (×2): qty 150

## 2014-11-03 MED ORDER — PANTOPRAZOLE SODIUM 40 MG PO TBEC
40.0000 mg | DELAYED_RELEASE_TABLET | Freq: Every day | ORAL | Status: DC
  Administered 2014-11-03 – 2014-11-07 (×4): 40 mg via ORAL
  Filled 2014-11-03 (×4): qty 1

## 2014-11-03 MED ORDER — ASPIRIN EC 81 MG PO TBEC
81.0000 mg | DELAYED_RELEASE_TABLET | Freq: Every day | ORAL | Status: DC
  Administered 2014-11-03 – 2014-11-07 (×4): 81 mg via ORAL
  Filled 2014-11-03 (×5): qty 1

## 2014-11-03 MED ORDER — SODIUM CHLORIDE 0.9 % IV BOLUS (SEPSIS)
250.0000 mL | Freq: Once | INTRAVENOUS | Status: AC
  Administered 2014-11-03: 250 mL via INTRAVENOUS

## 2014-11-03 MED ORDER — RISPERIDONE 0.25 MG PO TABS
0.2500 mg | ORAL_TABLET | Freq: Every day | ORAL | Status: DC
  Administered 2014-11-03 – 2014-11-06 (×4): 0.25 mg via ORAL
  Filled 2014-11-03 (×5): qty 1

## 2014-11-03 MED ORDER — CEFEPIME HCL 2 G IJ SOLR
2.0000 g | Freq: Once | INTRAMUSCULAR | Status: AC
  Administered 2014-11-03: 2 g via INTRAVENOUS
  Filled 2014-11-03: qty 2

## 2014-11-03 MED ORDER — HEPARIN SODIUM (PORCINE) 5000 UNIT/ML IJ SOLN
5000.0000 [IU] | Freq: Three times a day (TID) | INTRAMUSCULAR | Status: DC
  Administered 2014-11-03 – 2014-11-07 (×12): 5000 [IU] via SUBCUTANEOUS
  Filled 2014-11-03 (×15): qty 1

## 2014-11-03 MED ORDER — NEPRO/CARBSTEADY PO LIQD
237.0000 mL | Freq: Two times a day (BID) | ORAL | Status: DC
  Administered 2014-11-04 – 2014-11-07 (×7): 237 mL via ORAL

## 2014-11-03 NOTE — ED Notes (Signed)
Pt reports that he lives at a nursing home and his brother reports that he told him that the has been having chest pain for the past day. Pt is a dialysis and went today for his full treatment.

## 2014-11-03 NOTE — ED Notes (Signed)
Called care-link for level two sepsis @ 6:13

## 2014-11-03 NOTE — H&P (Signed)
[facility] History and Physical  James Nicholson [id number] DOB: [birth year] DOA: 11/03/2014  Referring physician: EDP PCP: [name], MD   Chief Complaint: Cough   HPI: James Nicholson is a 49 y.o. male ESRD dialysis TTS is brought in to ED by brother after dialysis today.  Patient's brother noted that he was having cough, SOB.  Cough is productive.  Patient unable to qualify how long its been going on for but suspect the last couple of days.  [facility].  Review of Systems: Systems reviewed.  As above, otherwise negative  Past Medical History  Diagnosis Date  . Hypertension   . Dyslipidemia   . Anemia   . Cellulitis   . Atrial fibrillation   . Enterobacter sepsis   . Schizophrenia   . Chronic kidney disease   . Hyperlipidemia   . Cardiomyopathy 2010    Unclear Etiology: Last Echo 06/2009: EF 40-45%, severe Lateral & apical Hypokinesis (? CAD) ; Grade 2 DDysfxn. Mild conc LVH.   . S/P ICD (internal cardiac defibrillator) procedure 2010    VT Arrest (in [place])   Past Surgical History  Procedure Laterality Date  . Cardiac defibrillator placement  2010    [place]  . Av fistula placement Right 02/10/2013    Procedure: ARTERIOVENOUS (AV) FISTULA CREATION;  Surgeon: Rosetta Posner, MD;  Location: [facility] OR;  Service: Vascular;  Laterality: Right;  Right forearm radial/cephalic arterovenous fistula.   . Ligation of competing branches of arteriovenous fistula Right 08/13/2013    Procedure: LIGATION OF COMPETING BRANCHES X5 OF ARTERIOVENOUS FISTULA- RIGHT ARM;  Surgeon: Serafina Mitchell, MD;  Location: [facility];  Service: Vascular;  Laterality: Right;  . Cardiac catheterization  2010    [place]: In setting of VT arrest. Per brother's report, nonobstructive with no intervention  . Fistulogram Right 08/09/2013    Procedure: FISTULOGRAM;  Surgeon: Angelia Mould, MD;  Location: [facility] CATH LAB;  Service: Cardiovascular;  Laterality: Right;   Social  History:  reports that he quit smoking about 6 years ago. He has never used smokeless tobacco. He reports that he does not drink alcohol or use illicit drugs.  No Known Allergies  Family History  Problem Relation Age of Onset  . CAD Mother      Prior to Admission medications   Medication Sig Start Date End Date Taking? Authorizing Provider  aspirin EC 81 MG EC tablet Take 1 tablet (81 mg total) by mouth daily. 12/25/13   Maryann Mikhail, DO  calcium acetate (PHOSLO) 667 MG capsule Take 667 mg by mouth every evening. At 5 pm 02/11/13   Belkys A Regalado, MD  insulin detemir (LEVEMIR) 100 UNIT/ML injection Inject 0.05 mLs (5 Units total) into the skin at bedtime. 02/11/13   Belkys A Regalado, MD  Multiple Vitamin (MULTIVITAMIN WITH MINERALS) TABS tablet Take 1 tablet by mouth daily.    Historical Provider, MD  Nutritional Supplements (FEEDING SUPPLEMENT, NEPRO CARB STEADY,) LIQD Take 237 mLs by mouth 2 (two) times daily between meals. 12/25/13   Maryann Mikhail, DO  omeprazole (PRILOSEC) 20 MG capsule Take 20 mg by mouth daily.    Historical Provider, MD  risperiDONE (RISPERDAL) 0.25 MG tablet Take 0.25 mg by mouth at bedtime.    Historical Provider, MD   Physical Exam: Filed Vitals:   11/03/14 2130  BP: 107/73  Pulse: 103  Temp:   Resp: 16    BP 107/73 mmHg  Pulse 103  Temp(Src) 99.1 F (37.3  C) (Oral)  Resp 16  SpO2 93%  General Appearance:    Alert, oriented, no distress, appears stated age  Head:    Normocephalic, atraumatic  Eyes:    PERRL, EOMI, sclera non-icteric        Nose:   Nares without drainage or epistaxis. Mucosa, turbinates normal  Throat:   Moist mucous membranes. Oropharynx without erythema or exudate.  Neck:   Supple. No carotid bruits.  No thyromegaly.  No lymphadenopathy.   Back:     No CVA tenderness, no spinal tenderness  Lungs:     Crackles and diminished at R base.  Chest Wall   No tenderness to palpitation  Heart:    Regular rate and rhythm without  murmurs, gallops, rubs  Abdomen:     Soft, non-tender, nondistended, normal bowel sounds, no organomegaly  Genitalia:    deferred  Rectal:    deferred  Extremities:   No clubbing, cyanosis or edema.  Pulses:   2+ and symmetric all extremities  Skin:   Skin color, texture, turgor normal, no rashes or lesions  Lymph nodes:   Cervical, supraclavicular, and axillary nodes normal  Neurologic:   CNII-XII intact. Normal strength, sensation and reflexes      throughout    Labs on Admission:  Basic Metabolic Panel:  Recent Labs Lab 11/03/14 1744 11/03/14 1827  NA 138 138  K 3.2* 3.2*  CL 90* 92*  CO2 33* 30  GLUCOSE 123* 109*  BUN 15 17  CREATININE 4.30* 4.36*  CALCIUM 8.7 8.8   Liver Function Tests:  Recent Labs Lab 11/03/14 1827  AST 47*  ALT 38  ALKPHOS 53  BILITOT 0.8  PROT 8.3  ALBUMIN 4.0   No results for input(s): LIPASE, AMYLASE in the last 168 hours. No results for input(s): AMMONIA in the last 168 hours. CBC:  Recent Labs Lab 11/03/14 1744  WBC 8.8  HGB 13.7  HCT 40.1  MCV 90.5  PLT 154   Cardiac Enzymes: No results for input(s): CKTOTAL, CKMB, CKMBINDEX, TROPONINI in the last 168 hours.  BNP (last 3 results) No results for input(s): PROBNP in the last 8760 hours. CBG: No results for input(s): GLUCAP in the last 168 hours.  Radiological Exams on Admission: Dg Chest Port 1 View  11/03/2014   CLINICAL DATA:  Chest pain. Onset of symptoms yesterday. Dialysis patient.  EXAM: PORTABLE CHEST - 1 VIEW  COMPARISON:  Chest radiograph 12/21/2013.  FINDINGS: Unchanged LEFT subclavian AICD. Low lung volumes are present. There is subsegmental atelectasis at the RIGHT lung base which is discoid in configuration. No focal airspace consolidation identified. Monitoring leads project over the chest. The cardiopericardial silhouette is within normal limits. Mediastinal contours are normal.  IMPRESSION: No acute cardiopulmonary disease. RIGHT basilar discoid atelectasis.    Electronically Signed   By: Dereck Ligas M.D.   On: 11/03/2014 19:36    EKG: Independently reviewed.  Assessment/Plan Principal Problem:   HCAP (healthcare-associated pneumonia) Active Problems:   End stage renal disease   Sepsis   IDDM (insulin dependent diabetes mellitus)   1. HCAP and sepsis- clinically patient has HCAP with sepsis.  Believe that what they are calling atelectasis on CXR is actually infiltrate given his clinical symptoms and exam findings. 1. Tylenol for fever 2. Cefepime and vanc 3. Tele monitor for tachycardia which has improved since IVF given on arrival 4. NS at 50 cc/hr 2. ESRD - left voicemail on dialysis consult line, next dialysis would be Sat. 3. IDDM -  patient only takes 5 units levemir at home QHS, will go ahead and continue this.  Spoke with brother to confirm that patient takes this (despite not seeing DM anywhere on his chart previously).    Code Status: Full Code  Family Communication: Brother at bedside Disposition Plan: Admit to inpatient   Time spent: 70 min  Velita Quirk M. [facility] Pager [phone]  If 7AM-7PM, please contact the day team taking care of the patient [email or web address] Password [facility] 11/03/2014, 9:37 PM

## 2014-11-03 NOTE — Progress Notes (Signed)
James Nicholson [id number] Admission Data: 11/03/2014 11:11 PM Attending Provider: Etta Quill, DO [id number], MD Code Status: Full  James Nicholson is a 49 y.o. male patient admitted from ED:  -No acute distress noted.  -No complaints of shortness of breath.  -No complaints of chest pain.   Cardiac Monitoring: Box # 6 in place. Cardiac monitor yields:sinus tachycardia.  Blood pressure 106/74, pulse 99, temperature 99.5 F (37.5 C), temperature source Oral, resp. rate 18, height 5\' 9"  (1.753 m), weight 68.3 kg (150 lb 9.2 oz), SpO2 97 %.   IV Fluids:  IV in place, occlusive dsg intact without redness, IV cath hand left, condition patent and no redness and forearm left, condition patent and no redness normal saline.   Allergies:  Review of patient's allergies indicates no known allergies.  Past Medical History:   has a past medical history of Hypertension; Dyslipidemia; Anemia; Cellulitis; Atrial fibrillation; Enterobacter sepsis; Schizophrenia; Chronic kidney disease; Hyperlipidemia; Cardiomyopathy (2010); and S/P ICD (internal cardiac defibrillator) procedure (2010).  Past Surgical History:   has past surgical history that includes Cardiac defibrillator placement (2010); AV fistula placement (Right, 02/10/2013); Ligation of competing branches of arteriovenous fistula (Right, 08/13/2013); Cardiac catheterization (2010); and Fistulogram (Right, 08/09/2013).  Social History:   reports that he quit smoking about 6 years ago. He has never used smokeless tobacco. He reports that he does not drink alcohol or use illicit drugs.  Skin: NSI  Patient/Family orientated to room. Information packet given to patient/family. Admission inpatient armband information verified with patient/family to include name and date of birth and placed on patient arm. Side rails up x 2, fall assessment and education completed with patient/family. Patient/family able to verbalize understanding of risk  associated with falls and verbalized understanding to call for assistance before getting out of bed. Call light within reach. Patient/family able to voice and demonstrate understanding of unit orientation instructions.

## 2014-11-03 NOTE — ED Provider Notes (Signed)
CSN: [id number]     Arrival date & time 11/03/14  1726 History   First MD Initiated Contact with Patient 11/03/14 1756     Chief Complaint  Patient presents with  . Chest Pain     The history is provided by the patient and a relative. No language interpreter was used.   Patient presents for evaluation of cough.history is provided by patient's brother. Complete history is not available due to poor historian, level V Caveat. Patient's brothers is that patient has had cough and increased shortness of breath for the last few days. No reports of fever. He had his dialysis today. It's unclear if he had a full treatment or not. His brother states that the patient is unable to really without significant dyspnea. He's had similar symptoms previously may related to pneumonia.  Past Medical History  Diagnosis Date  . Hypertension   . Dyslipidemia   . Anemia   . Cellulitis   . Atrial fibrillation   . Enterobacter sepsis   . Schizophrenia   . Chronic kidney disease   . Hyperlipidemia   . Cardiomyopathy 2010    Unclear Etiology: Last Echo 06/2009: EF 40-45%, severe Lateral & apical Hypokinesis (? CAD) ; Grade 2 DDysfxn. Mild conc LVH.   . S/P ICD (internal cardiac defibrillator) procedure 2010    VT Arrest (in [place])   Past Surgical History  Procedure Laterality Date  . Cardiac defibrillator placement  2010    [place]  . Av fistula placement Right 02/10/2013    Procedure: ARTERIOVENOUS (AV) FISTULA CREATION;  Surgeon: Rosetta Posner, MD;  Location: [facility] OR;  Service: Vascular;  Laterality: Right;  Right forearm radial/cephalic arterovenous fistula.   . Ligation of competing branches of arteriovenous fistula Right 08/13/2013    Procedure: LIGATION OF COMPETING BRANCHES X5 OF ARTERIOVENOUS FISTULA- RIGHT ARM;  Surgeon: Serafina Mitchell, MD;  Location: [place];  Service: Vascular;  Laterality: Right;  . Cardiac catheterization  2010    [place]: In setting of VT arrest. Per brother's report,  nonobstructive with no intervention  . Fistulogram Right 08/09/2013    Procedure: FISTULOGRAM;  Surgeon: Angelia Mould, MD;  Location[facility] C[facility] LAB;  Service: Cardiovascular;  Laterality: Right;   Family History  Problem Relation Age of Onset  . CAD Mother    History  Substance Use Topics  . Smoking status: Former Smoker    Quit date: 10/28/2008  . Smokeless tobacco: Never Used  . Alcohol Use: No    Review of Systems  Unable to perform ROS     Allergies  Review of patient's allergies indicates no known allergies.  Home Medications   Prior to Admission medications   Medication Sig Start Date End Date Taking? Authorizing Provider  aspirin EC 81 MG EC tablet Take 1 tablet (81 mg total) by mouth daily. 12/25/13   Maryann Mikhail, DO  calcium acetate (PHOSLO) 667 MG capsule Take 667 mg by mouth every evening. At 5 pm 02/11/13   Belkys A Regalado, MD  insulin detemir (LEVEMIR) 100 UNIT/ML injection Inject 0.05 mLs (5 Units total) into the skin at bedtime. 02/11/13   Belkys A Regalado, MD  Multiple Vitamin (MULTIVITAMIN WITH MINERALS) TABS tablet Take 1 tablet by mouth daily.    Historical Provider, MD  Nutritional Supplements (FEEDING SUPPLEMENT, NEPRO CARB STEADY,) LIQD Take 237 mLs by mouth 2 (two) times daily between meals. 12/25/13   Maryann Mikhail, DO  omeprazole (PRILOSEC) 20 MG capsule Take 20 mg by mouth  daily.    Historical Provider, MD  risperiDONE (RISPERDAL) 0.25 MG tablet Take 0.25 mg by mouth at bedtime.    Historical Provider, MD   BP 125/80 mmHg  Pulse 140  Temp(Src) 98.2 F (36.8 C)  Resp 20  SpO2 100% Physical Exam  Constitutional: He appears well-developed and well-nourished.  HENT:  Head: Normocephalic and atraumatic.  Cardiovascular: Normal rate and regular rhythm.   tachcyardic  Pulmonary/Chest:  tachypneic with good air movement bilaterally  Abdominal: Soft. There is no tenderness. There is no rebound and no guarding.  Musculoskeletal: He  exhibits no edema or tenderness.  Fistula in RUE with palpable thrill  Neurological: He is alert.  MAE symmetrically  Skin: Skin is warm and dry.  Psychiatric: He has a normal mood and affect. His behavior is normal.  Nursing note and vitals reviewed.   ED Course  Procedures (including critical care time) Labs Review Labs Reviewed  BASIC METABOLIC PANEL - Abnormal; Notable for the following:    Potassium 3.2 (*)    Chloride 90 (*)    CO2 33 (*)    Glucose, Bld 123 (*)    Creatinine, Ser 4.30 (*)    GFR calc non Af Amer 15 (*)    GFR calc Af Amer 17 (*)    All other components within normal limits  COMPREHENSIVE METABOLIC PANEL - Abnormal; Notable for the following:    Potassium 3.2 (*)    Chloride 92 (*)    Glucose, Bld 109 (*)    Creatinine, Ser 4.36 (*)    AST 47 (*)    GFR calc non Af Amer 15 (*)    GFR calc Af Amer 17 (*)    Anion gap 16 (*)    All other components within normal limits  I-STAT CG4 LACTIC ACID, ED - Abnormal; Notable for the following:    Lactic Acid, Venous 2.46 (*)    All other components within normal limits  CULTURE, BLOOD (ROUTINE X 2)  CULTURE, BLOOD (ROUTINE X 2)  URINE CULTURE  CULTURE, EXPECTORATED SPUTUM-ASSESSMENT  GRAM STAIN  MRSA PCR SCREENING  CBC  GLUCOSE, CAPILLARY  URINALYSIS, ROUTINE W REFLEX MICROSCOPIC  HIV ANTIBODY (ROUTINE TESTING)  LEGIONELLA ANTIGEN, URINE  STREP PNEUMONIAE URINARY ANTIGEN  BASIC METABOLIC PANEL  I-STAT TROPOININ, ED  I-STAT CG4 LACTIC ACID, ED    Imaging Review Dg Chest Port 1 View  11/03/2014   CLINICAL DATA:  Chest pain. Onset of symptoms yesterday. Dialysis patient.  EXAM: PORTABLE CHEST - 1 VIEW  COMPARISON:  Chest radiograph 12/21/2013.  FINDINGS: Unchanged LEFT subclavian AICD. Low lung volumes are present. There is subsegmental atelectasis at the RIGHT lung base which is discoid in configuration. No focal airspace consolidation identified. Monitoring leads project over the chest. The  cardiopericardial silhouette is within normal limits. Mediastinal contours are normal.  IMPRESSION: No acute cardiopulmonary disease. RIGHT basilar discoid atelectasis.   Electronically Signed   By: Dereck Ligas M.D.   On: 11/03/2014 19:36     EKG Interpretation   Date/Time:  Thursday November 03 2014 17:36:03 EST Ventricular Rate:  146 PR Interval:  130 QRS Duration: 126 QT Interval:  332 QTC Calculation: 517 R Axis:   -23 Text Interpretation:  Sinus tachycardia Possible Left atrial enlargement  Right bundle branch block Abnormal ECG Confirmed by Hazle Coca [phone]) on  11/03/2014 6:00:18 PM      MDM   Final diagnoses:  Fever, unspecified fever cause    Patient with end-stage renal disease  here with fever and cough, shortness of breath.  Sepsis protocol initiated on ED evaluation given concern for HCAP.  Patient treated empirically with abx pending workup. Patient was only provided with 500 mL IV fluid bolus given his end-stage renal disease.chest x-ray does not have any evidence of pneumonia.  Discussed with medicine admission for further evaluation of potential developing pneumonia given patient's fever, cough. Dr. Alcario Drought agrees to see the patient for admission.    Quintella Reichert, MD 11/04/14 [phone]

## 2014-11-03 NOTE — Progress Notes (Signed)
ANTIBIOTIC CONSULT NOTE - INITIAL  Pharmacy Consult for Vancomycin & Cefepime Indication: rule out sepsis  No Known Allergies  Patient Measurements:  Vital Signs: Temp: 102.9 F (39.4 C) (01/07 1814) Temp Source: Oral (01/07 1814) BP: 114/73 mmHg (01/07 1845) Pulse Rate: 112 (01/07 1845) Intake/Output from previous day:   Intake/Output from this shift:    Labs:  Recent Labs  11/03/14 1744  WBC 8.8  HGB 13.7  PLT 154  CREATININE 4.30*   CrCl cannot be calculated (Unknown ideal weight.). No results for input(s): VANCOTROUGH, VANCOPEAK, VANCORANDOM, GENTTROUGH, GENTPEAK, GENTRANDOM, TOBRATROUGH, TOBRAPEAK, TOBRARND, AMIKACINPEAK, AMIKACINTROU, AMIKACIN in the last 72 hours.   Microbiology: No results found for this or any previous visit (from the past 720 hour(s)).  Medical History: Past Medical History  Diagnosis Date  . Hypertension   . Dyslipidemia   . Anemia   . Cellulitis   . Atrial fibrillation   . Enterobacter sepsis   . Schizophrenia   . Chronic kidney disease   . Hyperlipidemia   . Cardiomyopathy 2010    Unclear Etiology: Last Echo 06/2009: EF 40-45%, severe Lateral & apical Hypokinesis (? CAD) ; Grade 2 DDysfxn. Mild conc LVH.   . S/P ICD (internal cardiac defibrillator) procedure 2010    VT Arrest (in [place])    Medications:  Scheduled:   Infusions:  . vancomycin 1,000 mg (11/03/14 1849)  . vancomycin     Assessment: James Nicholson with PMH of ESRD on dialysis (per pt family gets dialysis Tues, Thurs, Sat) presenting to [facility] on 11/03/14 c/o CP.  Pharmacy has been consulted to dose vancomycin and cefepime.  LA elevated 2.46.  SIRS (2/4) - WBC wnl 8.8, Tmax 102.9, HR 112, RR 15.  Cultures have been drawn.     Goal of Therapy:  Target pre-HD level 15-25 mcg/mL Target post-HD level 5-15 mcg/mL  Plan:  - Vancomycin 2 g IV x 1 - Cefepime 2 g IV every dialysis - Monitor vancomycin levels when appropriate - Monitor for clinical efficacy - Follow up  cultures - Follow up dialysis plans  Hassie Bruce, Pharm. D. Clinical Pharmacy Resident Pager: [phone] Ph: [phone] 11/03/2014 7:18 PM

## 2014-11-03 NOTE — Progress Notes (Signed)
Received pt report from [place].

## 2014-11-03 NOTE — ED Notes (Signed)
EDP made aware of BP.  Will continue to monitor.

## 2014-11-03 NOTE — ED Notes (Addendum)
Blood cultures have been obtained, no urine specimen at this time. Pt is a dyalisis pt and was unable to void.

## 2014-11-03 NOTE — ED Notes (Signed)
EKG shown to Dr. Eulis Foster  he is aware of the heart rate

## 2014-11-04 LAB — BASIC METABOLIC PANEL
Anion gap: 16 — ABNORMAL HIGH (ref 5–15)
BUN: 28 mg/dL — ABNORMAL HIGH (ref 6–23)
CO2: 28 mmol/L (ref 19–32)
Calcium: 8.2 mg/dL — ABNORMAL LOW (ref 8.4–10.5)
Chloride: 91 mEq/L — ABNORMAL LOW (ref 96–112)
Creatinine, Ser: 5.91 mg/dL — ABNORMAL HIGH (ref 0.50–1.35)
GFR calc Af Amer: 12 mL/min — ABNORMAL LOW (ref >90)
GFR calc non Af Amer: 10 mL/min — ABNORMAL LOW (ref >90)
Glucose, Bld: 98 mg/dL (ref 70–99)
Potassium: 3.1 mmol/L — ABNORMAL LOW (ref 3.5–5.1)
Sodium: 135 mmol/L (ref 135–145)

## 2014-11-04 LAB — URINE MICROSCOPIC-ADD ON

## 2014-11-04 LAB — URINALYSIS, ROUTINE W REFLEX MICROSCOPIC
Glucose, UA: NEGATIVE mg/dL
Ketones, ur: 15 mg/dL — AB
Leukocytes, UA: NEGATIVE
Nitrite: NEGATIVE
Protein, ur: 100 mg/dL — AB
Specific Gravity, Urine: 1.023 (ref 1.005–1.030)
Urobilinogen, UA: 0.2 mg/dL (ref 0.0–1.0)
pH: 5.5 (ref 5.0–8.0)

## 2014-11-04 LAB — GLUCOSE, CAPILLARY
Glucose-Capillary: 106 mg/dL — ABNORMAL HIGH (ref 70–99)
Glucose-Capillary: 101 mg/dL — ABNORMAL HIGH (ref 70–99)
Glucose-Capillary: 127 mg/dL — ABNORMAL HIGH (ref 70–99)
Glucose-Capillary: 112 mg/dL — ABNORMAL HIGH (ref 70–99)
Glucose-Capillary: 120 mg/dL — ABNORMAL HIGH (ref 70–99)

## 2014-11-04 LAB — STREP PNEUMONIAE URINARY ANTIGEN: Strep Pneumo Urinary Antigen: NEGATIVE

## 2014-11-04 LAB — MRSA PCR SCREENING: MRSA by PCR: NEGATIVE

## 2014-11-04 MED ORDER — CALCITRIOL 0.5 MCG PO CAPS
1.0000 ug | ORAL_CAPSULE | ORAL | Status: DC
  Filled 2014-11-04: qty 2

## 2014-11-04 MED ORDER — SODIUM CHLORIDE 0.9 % IV SOLN: 62.5000 mg | INTRAVENOUS | Status: DC

## 2014-11-04 MED ORDER — POTASSIUM CHLORIDE CRYS ER 20 MEQ PO TBCR
40.0000 meq | EXTENDED_RELEASE_TABLET | Freq: Once | ORAL | Status: AC
  Administered 2014-11-04: 40 meq via ORAL
  Filled 2014-11-04: qty 2

## 2014-11-04 MED ORDER — CALCIUM CARBONATE ANTACID 500 MG PO CHEW
1.0000 | CHEWABLE_TABLET | Freq: Two times a day (BID) | ORAL | Status: DC | PRN
  Administered 2014-11-04 – 2014-11-05 (×2): 200 mg via ORAL
  Filled 2014-11-04 (×3): qty 1

## 2014-11-04 MED ORDER — INSULIN ASPART 100 UNIT/ML ~~LOC~~ SOLN: 0.0000 [IU] | Freq: Every day | SUBCUTANEOUS | Status: DC

## 2014-11-04 MED ORDER — INSULIN ASPART 100 UNIT/ML ~~LOC~~ SOLN
0.0000 [IU] | Freq: Three times a day (TID) | SUBCUTANEOUS | Status: DC
  Administered 2014-11-04: 1 [IU] via SUBCUTANEOUS

## 2014-11-04 MED ORDER — CALCIUM ACETATE 667 MG PO CAPS
1334.0000 mg | ORAL_CAPSULE | Freq: Three times a day (TID) | ORAL | Status: DC
  Administered 2014-11-04 – 2014-11-07 (×8): 1334 mg via ORAL
  Filled 2014-11-04 (×12): qty 2

## 2014-11-04 NOTE — Consult Note (Signed)
[facility] Renal Consultation Note  Indication for Consultation:  Management of ESRD/hemodialysis; anemia, hypertension/volume and secondary hyperparathyroidism  HPI: James Nicholson is a 49 y.o. male admitted with HCAP. He is on Chronic HD TTS ([place]) attended HD yesterday. His brother brought him to ER yesterday after HD  secondary to reported increasing cough with SOB. Pt only co "bad cough", language barrier for  historian. He is from [place] speaking some English. Temp 102 in ER with CXR showing infiltrate. He  had fever 99.7 on HD 11/01/14 with blood cultures drawn  without Antib = no growth to date. Currently co only of dry cough not in any distress.    Past Medical History  Diagnosis Date  . Hypertension   . Dyslipidemia   . Anemia   . Cellulitis   . Atrial fibrillation   . Enterobacter sepsis   . Schizophrenia   . Chronic kidney disease   . Hyperlipidemia   . Cardiomyopathy 2010    Unclear Etiology: Last Echo 06/2009: EF 40-45%, severe Lateral & apical Hypokinesis (? CAD) ; Grade 2 DDysfxn. Mild conc LVH.   . S/P ICD (internal cardiac defibrillator) procedure 2010    VT Arrest (in [place])    Past Surgical History  Procedure Laterality Date  . Cardiac defibrillator placement  2010    [place]  . Av fistula placement Right 02/10/2013    Procedure: ARTERIOVENOUS (AV) FISTULA CREATION;  Surgeon: Rosetta Posner, MD;  Location: [facility] OR;  Service: Vascular;  Laterality: Right;  Right forearm radial/cephalic arterovenous fistula.   . Ligation of competing branches of arteriovenous fistula Right 08/13/2013    Procedure: LIGATION OF COMPETING BRANCHES X5 OF ARTERIOVENOUS FISTULA- RIGHT ARM;  Surgeon: Serafina Mitchell, MD;  Location: [place];  Service: Vascular;  Laterality: Right;  . Cardiac catheterization  2010    [place]: In setting of VT arrest. Per brother's report, nonobstructive with no intervention  . Fistulogram Right 08/09/2013    Procedure: FISTULOGRAM;  Surgeon:  Angelia Mould, MD;  Location: [facility] CATH LAB;  Service: Cardiovascular;  Laterality: Right;      Family History  Problem Relation Age of Onset  . CAD Mother       reports that he quit smoking about 6 years ago. He has never used smokeless tobacco. He reports that he does not drink alcohol or use illicit drugs.  No Known Allergies  Prior to Admission medications   Medication Sig Start Date End Date Taking? Authorizing Provider  calcium acetate (PHOSLO) 667 MG capsule Take 1,334 mg by mouth 3 (three) times daily with meals. 8a, 12p, 5p 02/11/13  Yes Belkys A Regalado, MD  gabapentin (NEURONTIN) 300 MG capsule Take 300 mg by mouth 2 (two) times daily.   Yes Historical Provider, MD  insulin detemir (LEVEMIR) 100 UNIT/ML injection Inject 0.05 mLs (5 Units total) into the skin at bedtime. 02/11/13  Yes Belkys A Regalado, MD  Multiple Vitamin (MULTIVITAMIN WITH MINERALS) TABS tablet Take 1 tablet by mouth every morning.    Yes Historical Provider, MD  omeprazole (PRILOSEC) 20 MG capsule Take 20 mg by mouth every morning.    Yes Historical Provider, MD  pravastatin (PRAVACHOL) 20 MG tablet Take 20 mg by mouth at bedtime.   Yes Historical Provider, MD  risperiDONE (RISPERDAL) 0.25 MG tablet Take 0.25 mg by mouth at bedtime.   Yes Historical Provider, MD  aspirin EC 81 MG EC tablet Take 1 tablet (81 mg total) by mouth daily. Patient not taking:  Reported on 11/04/2014 12/25/13   Edsel Petrin, DO  Nutritional Supplements (FEEDING SUPPLEMENT, NEPRO CARB STEADY,) LIQD Take 237 mLs by mouth 2 (two) times daily between meals. Patient not taking: Reported on 11/04/2014 12/25/13   Edsel Petrin, DO    Continuous:   Results for orders placed or performed during the hospital encounter of 11/03/14 (from the past 48 hour(s))  CBC     Status: None   Collection Time: 11/03/14  5:44 PM  Result Value Ref Range   WBC 8.8 4.0 - 10.5 K/uL   RBC 4.43 4.22 - 5.81 MIL/uL   Hemoglobin 13.7 13.0 - 17.0 g/dL   HCT  08.6 57.8 - 46.9 %   MCV 90.5 78.0 - 100.0 fL   MCH 30.9 26.0 - 34.0 pg   MCHC 34.2 30.0 - 36.0 g/dL   RDW 62.9 52.8 - 41.3 %   Platelets 154 150 - 400 K/uL  Basic metabolic panel     Status: Abnormal   Collection Time: 11/03/14  5:44 PM  Result Value Ref Range   Sodium 138 135 - 145 mmol/L    Comment: Please note change in reference range.   Potassium 3.2 (L) 3.5 - 5.1 mmol/L    Comment: Please note change in reference range.   Chloride 90 (L) 96 - 112 mEq/L   CO2 33 (H) 19 - 32 mmol/L   Glucose, Bld 123 (H) 70 - 99 mg/dL   BUN 15 6 - 23 mg/dL   Creatinine, Ser 2.44 (H) 0.50 - 1.35 mg/dL   Calcium 8.7 8.4 - 01.0 mg/dL   GFR calc non Af Amer 15 (L) >90 mL/min   GFR calc Af Amer 17 (L) >90 mL/min    Comment: (NOTE) The eGFR has been calculated using the CKD EPI equation. This calculation has not been validated in all clinical situations. eGFR's persistently <90 mL/min signify possible Chronic Kidney Disease.    Anion gap 15 5 - 15  I-stat troponin, ED (not at [facility])     Status: None   Collection Time: 11/03/14  6:00 PM  Result Value Ref Range   Troponin i, poc 0.03 0.00 - 0.08 ng/mL   Comment 3            Comment: Due to the release kinetics of cTnI, a negative result within the first hours of the onset of symptoms does not rule out myocardial infarction with certainty. If myocardial infarction is still suspected, repeat the test at appropriate intervals.   Comprehensive metabolic panel     Status: Abnormal   Collection Time: 11/03/14  6:27 PM  Result Value Ref Range   Sodium 138 135 - 145 mmol/L    Comment: Please note change in reference range.   Potassium 3.2 (L) 3.5 - 5.1 mmol/L    Comment: Please note change in reference range.   Chloride 92 (L) 96 - 112 mEq/L   CO2 30 19 - 32 mmol/L   Glucose, Bld 109 (H) 70 - 99 mg/dL   BUN 17 6 - 23 mg/dL   Creatinine, Ser 2.72 (H) 0.50 - 1.35 mg/dL   Calcium 8.8 8.4 - 53.6 mg/dL   Total Protein 8.3 6.0 - 8.3 g/dL   Albumin  4.0 3.5 - 5.2 g/dL   AST 47 (H) 0 - 37 U/L   ALT 38 0 - 53 U/L   Alkaline Phosphatase 53 39 - 117 U/L   Total Bilirubin 0.8 0.3 - 1.2 mg/dL   GFR calc non Af  Amer 15 (L) >90 mL/min   GFR calc Af Amer 17 (L) >90 mL/min    Comment: (NOTE) The eGFR has been calculated using the CKD EPI equation. This calculation has not been validated in all clinical situations. eGFR's persistently <90 mL/min signify possible Chronic Kidney Disease.    Anion gap 16 (H) 5 - 15  I-Stat CG4 Lactic Acid, ED     Status: Abnormal   Collection Time: 11/03/14  6:35 PM  Result Value Ref Range   Lactic Acid, Venous 2.46 (H) 0.5 - 2.2 mmol/L  I-Stat CG4 Lactic Acid, ED     Status: None   Collection Time: 11/03/14  9:05 PM  Result Value Ref Range   Lactic Acid, Venous 0.97 0.5 - 2.2 mmol/L  MRSA PCR Screening     Status: None   Collection Time: 11/03/14 10:49 PM  Result Value Ref Range   MRSA by PCR NEGATIVE NEGATIVE    Comment:        The GeneXpert MRSA Assay (FDA approved for NASAL specimens only), is one component of a comprehensive MRSA colonization surveillance program. It is not intended to diagnose MRSA infection nor to guide or monitor treatment for MRSA infections.   Glucose, capillary     Status: None   Collection Time: 11/03/14 11:42 PM  Result Value Ref Range   Glucose-Capillary 99 70 - 99 mg/dL  Glucose, capillary     Status: Abnormal   Collection Time: 11/04/14  3:43 AM  Result Value Ref Range   Glucose-Capillary 106 (H) 70 - 99 mg/dL   Comment 1 Notify RN    Comment 2 Documented in Chart   Basic metabolic panel     Status: Abnormal   Collection Time: 11/04/14  5:20 AM  Result Value Ref Range   Sodium 135 135 - 145 mmol/L    Comment: Please note change in reference range.   Potassium 3.1 (L) 3.5 - 5.1 mmol/L    Comment: Please note change in reference range.   Chloride 91 (L) 96 - 112 mEq/L   CO2 28 19 - 32 mmol/L   Glucose, Bld 98 70 - 99 mg/dL   BUN 28 (H) 6 - 23 mg/dL     Comment: DELTA CHECK NOTED   Creatinine, Ser 5.91 (H) 0.50 - 1.35 mg/dL   Calcium 8.2 (L) 8.4 - 10.5 mg/dL   GFR calc non Af Amer 10 (L) >90 mL/min   GFR calc Af Amer 12 (L) >90 mL/min    Comment: (NOTE) The eGFR has been calculated using the CKD EPI equation. This calculation has not been validated in all clinical situations. eGFR's persistently <90 mL/min signify possible Chronic Kidney Disease.    Anion gap 16 (H) 5 - 15  Glucose, capillary     Status: Abnormal   Collection Time: 11/04/14  7:40 AM  Result Value Ref Range   Glucose-Capillary 101 (H) 70 - 99 mg/dL    ROS: Language barrier so poor  history / but denies chest pain , vomited x1 this am sec to Increased cough per pt.  Physical Exam: Filed Vitals:   11/04/14 0449  BP: 110/71  Pulse: 104  Temp: 98.6 F (37 C)  Resp: 15     General: Alert, adult male  appears normal MS, NAD, OX3 HEENT: [place], MMM, Nonicteric Neck: No jvd Heart: RRR, no rub, mur, or gallop Lungs: Bilat crackles, Rt>lt Abdomen: BS Pos. , soft , NT, ND Extremities:  No pedal edema Skin:  Warm to  touch, no overt rtash Neuro:  Ox3 no acute focal deficits noted moves all extrem Dialysis Access: Pos. Bruit  RFA AVF  Dialysis Orders: Center: [facility]   on tts . EDW 76.5kg HD Bath 2.ok, 2.o ca  Time 4hrs Heparin 3,000. Access  RFA AVF     Calcitriol 1.0 mcg IV/HD   Units IV/HD  Venofer  $Remove'50mg'udDaTJK$  q thurs hd  Other op labs= HBG 13.6/ tfs 38%/ pth 307  Last ca 9.3  Phos 3.5  Assessment/Plan 1. HCAP- on iv antibiotics / fu op bld cult from 1/05 at [facility] 2. ESRD -  Next hd on schedule sat  K3.2 given po k / check k pre hd use 3.o k bath 3. Hypertension/volume  - bp 110/71  No bp meds 4. Anemia  - no esa with 13.7 hgb/ weekly iron  5. Metabolic bone disease -  Po caltriol. Binders  6. HO Schizophrenia-  At [facility] , on po Risperdal/ [facility]  7. Nutrition - carb renal diet / renal vit 8. Hypokalemia  Ernest Haber, PA-C [facility] Beeper [phone] 11/04/2014, 10:07 AM  I have seen and examined this patient and agree with plan per Ernest Haber.   49yo WM admitted yest with fever and cough with CXR suspicious for PNA.  Cough a little better today.  No SOB.  Bilat crackles R>L.  On AB.  Plan HD today.  K sl low but would not give K. Shaughn Thomley T,MD 11/04/2014 11:37 AM

## 2014-11-04 NOTE — Clinical Social Work Note (Signed)
Clinical Social Work Department BRIEF PSYCHOSOCIAL ASSESSMENT 11/04/2014  Patient:  James Nicholson, James Nicholson     Account Number:  [id number]     Admit date:  11/03/2014  Clinical Social Worker:  Myles Lipps  Date/Time:  11/04/2014 04:00 PM  Referred by:  Physician  Date Referred:  11/04/2014 Referred for  ALF Placement  SNF Placement   Other Referral:   Interview type:  Patient Other interview type:   Spoke with patient brother over the phone    PSYCHOSOCIAL DATA Living Status:  FACILITY Admitted from facility:   Level of care:  Assisted Living Primary support name:  James Nicholson, James Nicholson  [phone] Primary support relationship to patient:  SIBLING Degree of support available:   Strong    CURRENT CONCERNS Current Concerns  Post-Acute Placement   Other Concerns:    SOCIAL WORK ASSESSMENT / PLAN Clinical Social Worker met with patient and spoke with patient brother over the phone to offer support and discuss patient needs at discharge.  Patient with broken English and requested that CSW contact his brother.  Patient brother states that patient is from [facility] and is understanding that patient will not be able to return at discharge.  Patient brother is agreeable with SNF search in [place].  CSW to follow up with patient and patient brother to provide available bed offers and facilitate patient discharge needs.   Assessment/plan status:  Psychosocial Support/Ongoing Assessment of Needs Other assessment/ plan:   Information/referral to community resources:   Clinical Social Worker explained SNF search process over the phone.  At the time offers are provided will likely need [place] interpreter.    PATIENT'S/FAMILY'S RESPONSE TO PLAN OF CARE: Patient alert and oriented x3 sitting up in bed.  Patient with very broken English and questionable understanding. Patient is able to state that his primary language is Coatian - no interpreter available but permission  given to speak with his brother.  Patient with good family support who is understanding of SNF search process and CSW role.

## 2014-11-04 NOTE — Progress Notes (Signed)
PROGRESS NOTE  James Nicholson [id number] DOB: Mar 11, 1966 DOA: 11/03/2014 PCP: [name], MD  Assessment/Plan: HCAP and sepsis- clinically patient has HCAP with sepsis. Believe that what they are calling atelectasis on CXR is actually infiltrate given his clinical symptoms and exam findings. Tylenol for fever Cefepime and vanc Tele monitor for tachycardia which has improved since IVF given on arrival NS at 50 cc/hr  ESRD - left voicemail on dialysis consult line, next dialysis would be Sat.  IDDM - patient only takes 5 units levemir at home QHS, will go ahead and continue this.   Code Status: full Family Communication: brother at bedside Disposition Plan: from ALF- suspect 2-3 more days here   Consultants:  renal  Procedures:      HPI/Subjective: No new c/o this AM  Objective: Filed Vitals:   11/04/14 0449  BP: 110/71  Pulse: 104  Temp: 98.6 F (37 C)  Resp: 15    Intake/Output Summary (Last 24 hours) at 11/04/14 0947 Last data filed at 11/04/14 0500  Gross per 24 hour  Intake 654.17 ml  Output      0 ml  Net 654.17 ml   Filed Weights   11/03/14 2233  Weight: 68.3 kg (150 lb 9.2 oz)    Exam:   General:  Pleasant/cooperative  Cardiovascular: rrr  Respiratory: no wheezing  Abdomen: +BS, soft  Musculoskeletal:    Data Reviewed: Basic Metabolic Panel:  Recent Labs Lab 11/03/14 1744 11/03/14 1827 11/04/14 0520  NA 138 138 135  K 3.2* 3.2* 3.1*  CL 90* 92* 91*  CO2 33* 30 28  GLUCOSE 123* 109* 98  BUN 15 17 28*  CREATININE 4.30* 4.36* 5.91*  CALCIUM 8.7 8.8 8.2*   Liver Function Tests:  Recent Labs Lab 11/03/14 1827  AST 47*  ALT 38  ALKPHOS 53  BILITOT 0.8  PROT 8.3  ALBUMIN 4.0   No results for input(s): LIPASE, AMYLASE in the last 168 hours. No results for input(s): AMMONIA in the last 168 hours. CBC:  Recent Labs Lab 11/03/14 1744  WBC 8.8  HGB 13.7  HCT 40.1  MCV 90.5  PLT 154   Cardiac  Enzymes: No results for input(s): CKTOTAL, CKMB, CKMBINDEX, TROPONINI in the last 168 hours. BNP (last 3 results) No results for input(s): PROBNP in the last 8760 hours. CBG:  Recent Labs Lab 11/03/14 2342 11/04/14 0343 11/04/14 0740  GLUCAP 99 106* 101*    Recent Results (from the past 240 hour(s))  MRSA PCR Screening     Status: None   Collection Time: 11/03/14 10:49 PM  Result Value Ref Range Status   MRSA by PCR NEGATIVE NEGATIVE Final    Comment:        The GeneXpert MRSA Assay (FDA approved for NASAL specimens only), is one component of a comprehensive MRSA colonization surveillance program. It is not intended to diagnose MRSA infection nor to guide or monitor treatment for MRSA infections.      Studies: Dg Chest Port 1 View  11/03/2014   CLINICAL DATA:  Chest pain. Onset of symptoms yesterday. Dialysis patient.  EXAM: PORTABLE CHEST - 1 VIEW  COMPARISON:  Chest radiograph 12/21/2013.  FINDINGS: Unchanged LEFT subclavian AICD. Low lung volumes are present. There is subsegmental atelectasis at the RIGHT lung base which is discoid in configuration. No focal airspace consolidation identified. Monitoring leads project over the chest. The cardiopericardial silhouette is within normal limits. Mediastinal contours are normal.  IMPRESSION: No acute cardiopulmonary disease. RIGHT basilar discoid atelectasis.  Electronically Signed   By: Dereck Ligas M.D.   On: 11/03/2014 19:36    Scheduled Meds: . aspirin EC  81 mg Oral Daily  . calcium acetate  1,334 mg Oral TID WC  . [START ON 11/05/2014] ceFEPime (MAXIPIME) IV  2 g Intravenous Q T,Th,Sa-HD  . feeding supplement (NEPRO CARB STEADY)  237 mL Oral BID BM  . heparin  5,000 Units Subcutaneous 3 times per day  . insulin detemir  5 Units Subcutaneous QHS  . multivitamin with minerals  1 tablet Oral Daily  . pantoprazole  40 mg Oral Daily  . risperiDONE  0.25 mg Oral QHS  . [START ON 11/05/2014] vancomycin  750 mg Intravenous Q  T,Th,Sa-HD   Continuous Infusions: . sodium chloride 50 mL/hr at 11/03/14 2319   Antibiotics Given (last 72 hours)    None      Principal Problem:   HCAP (healthcare-associated pneumonia) Active Problems:   End stage renal disease   Sepsis   IDDM (insulin dependent diabetes mellitus)    Time spent: 25 min    Kyiah Canepa  [facility] Pager [phone]. If 7PM-7AM, please contact night-coverage at [email or web address], password [facility] 11/04/2014, 9:47 AM  LOS: 1 day

## 2014-11-04 NOTE — Clinical Social Work Placement (Signed)
Clinical Social Work Department CLINICAL SOCIAL WORK PLACEMENT NOTE 11/04/2014  Patient:  James Nicholson, James Nicholson  Account Number:  [id number] Admit date:  11/03/2014  Clinical Social Worker:  Barbette Or, LCSW  Date/time:  11/04/2014 04:00 PM  Clinical Social Work is seeking post-discharge placement for this patient at the following level of care:   [place]   (*CSW will update this form in Epic as items are completed)   11/04/2014  Patient/family provided with [place] Department of Clinical Social Work's list of facilities offering this level of care within the geographic area requested by the patient (or if unable, by the patient's family).  11/04/2014  Patient/family informed of their freedom to choose among providers that offer the needed level of care, that participate in Medicare, Medicaid or managed care program needed by the patient, have an available bed and are willing to accept the patient.  11/04/2014  Patient/family informed of [facility]' ownership interest in [facility], as well as of the fact that they are under no obligation to receive care at this facility.  PASARR submitted to EDS on  PASARR number received on   FL2 transmitted to all facilities in geographic area requested by pt/family on  11/04/2014 FL2 transmitted to all facilities within larger geographic area on   Patient informed that his/her managed care company has contracts with or will negotiate with  certain facilities, including the following:     Patient/family informed of bed offers received:   Patient chooses bed at  Physician recommends and patient chooses bed at    Patient to be transferred to  on   Patient to be transferred to facility by  Patient and family notified of transfer on  Name of family member notified:    The following physician request were entered in Epic:   Additional Comments: 01/08 Patient will likely need new SNF pasarr

## 2014-11-04 NOTE — Evaluation (Signed)
Physical Therapy Evaluation Patient Details Name: James Nicholson MRN: [id number] DOB: 14-Oct-1966 Today's Date: 11/04/2014   History of Present Illness  Pt admit with HCAP.  Clinical Impression  Pt admitted with above diagnosis. Pt currently with functional limitations due to the deficits listed below (see PT Problem List). Pt able to ambulate with min guard assist with slight unsteady gait.  Chart states in one place pt from NH and another place states group home.  Need to clarify pts prior disposition and what disposition will be on d/c.  Pt may need RW and given confusion recommend 24 hour care.   Pt will benefit from skilled PT to increase their independence and safety with mobility to allow discharge to the venue listed below.      Follow Up Recommendations SNF;Supervision/Assistance - 24 hour    Equipment Recommendations  Rolling walker with 5" wheels    Recommendations for Other Services       Precautions / Restrictions Precautions Precautions: None Restrictions Weight Bearing Restrictions: No      Mobility  Bed Mobility Overal bed mobility: Independent             General bed mobility comments: took incr time   Transfers Overall transfer level: Independent Equipment used: None                Ambulation/Gait Ambulation/Gait assistance: Supervision;Min guard Ambulation Distance (Feet): 300 Feet Assistive device: None Gait Pattern/deviations: Step-through pattern;Decreased stride length;Drifts right/left   Gait velocity interpretation: Below normal speed for age/gender General Gait Details: Pt able to ambulate without device although with some mildly unsteady gait.  Suggested pt use RW at [facility] for safety.  Pt states he is close to baseline.     Stairs Stairs: Yes Stairs assistance: Min guard Stair Management: Alternating pattern;Step to pattern;Forwards;One rail Right Number of Stairs: 12 General stair comments: Pt able to ascend and descend steps  without physical assist.      Wheelchair Mobility    Modified Rankin (Stroke Patients Only)       Balance Overall balance assessment: Needs assistance;History of Falls Sitting-balance support: No upper extremity supported;Feet supported Sitting balance-Leahy Scale: Fair     Standing balance support: During functional activity;No upper extremity supported Standing balance-Leahy Scale: Fair Standing balance comment: can stand statically without support.  Did well overall.              High level balance activites: Direction changes;Turns;Head turns;Sudden stops High Level Balance Comments: Pt needs steadying assist with challenges without RW.  Will assess using RW at next visit.               Pertinent Vitals/Pain Pain Assessment: No/denies pain  VSS with sats on RA >90%.      Home Living Family/patient expects to be discharged to:: [place]: None Additional Comments: Per chart pt from NH. Pt confused as he states he has 10 steps at home however chart states he is in NH.      Prior Function Level of Independence: Independent               Hand Dominance   Dominant Hand: Right    Extremity/Trunk Assessment   Upper Extremity Assessment: Defer to OT evaluation           Lower Extremity Assessment: Generalized weakness      Cervical / Trunk Assessment: Normal  Communication  Communication: Prefers language other than English ([place])  Cognition Arousal/Alertness: Awake/alert Behavior During Therapy: WFL for tasks assessed/performed Overall Cognitive Status: History of cognitive impairments - at baseline       Memory: Decreased short-term memory              General Comments      Exercises General Exercises - Lower Extremity Ankle Circles/Pumps: AROM;Both;10 reps;Seated Long Arc Quad: AROM;10 reps;Seated;Both Hip Flexion/Marching: AROM;Both;10 reps;Seated      Assessment/Plan     PT Assessment Patient needs continued PT services  PT Diagnosis Difficulty walking;Generalized weakness   PT Problem List Decreased activity tolerance;Decreased balance;Decreased mobility;Decreased knowledge of use of DME;Decreased safety awareness;Decreased knowledge of precautions  PT Treatment Interventions DME instruction;Gait training;Functional mobility training;Stair training;Therapeutic activities;Therapeutic exercise;Balance training;Patient/family education   PT Goals (Current goals can be found in the Care Plan section) Acute Rehab PT Goals Patient Stated Goal: unable to assess  PT Goal Formulation: With patient Time For Goal Achievement: 11/11/14 Potential to Achieve Goals: Good    Frequency Min 3X/week   Barriers to discharge        Co-evaluation               End of Session Equipment Utilized During Treatment: Gait belt Activity Tolerance: Patient limited by fatigue Patient left: in chair;with call bell/phone within reach;with chair alarm set Nurse Communication: Mobility status         Time: 1140-1154 PT Time Calculation (min) (ACUTE ONLY): 14 min   Charges:   PT Evaluation $Initial PT Evaluation Tier I: 1 Procedure PT Treatments $Gait Training: 8-22 mins   PT G CodesDenice Paradise 11-11-14, 12:27 PM Karris Deangelo,PT Acute Rehabilitation [phone] (pager)

## 2014-11-05 DIAGNOSIS — M3130 Wegener's granulomatosis without renal involvement: Secondary | ICD-10-CM

## 2014-11-05 LAB — GLUCOSE, CAPILLARY
Glucose-Capillary: 95 mg/dL (ref 70–99)
Glucose-Capillary: 108 mg/dL — ABNORMAL HIGH (ref 70–99)

## 2014-11-05 LAB — RENAL FUNCTION PANEL
Albumin: 2.9 g/dL — ABNORMAL LOW (ref 3.5–5.2)
Anion gap: 16 — ABNORMAL HIGH (ref 5–15)
BUN: 57 mg/dL — ABNORMAL HIGH (ref 6–23)
CO2: 25 mmol/L (ref 19–32)
Calcium: 8.3 mg/dL — ABNORMAL LOW (ref 8.4–10.5)
Chloride: 91 mEq/L — ABNORMAL LOW (ref 96–112)
Creatinine, Ser: 7.35 mg/dL — ABNORMAL HIGH (ref 0.50–1.35)
GFR calc Af Amer: 9 mL/min — ABNORMAL LOW (ref >90)
GFR calc non Af Amer: 8 mL/min — ABNORMAL LOW (ref >90)
Glucose, Bld: 88 mg/dL (ref 70–99)
Phosphorus: 2.6 mg/dL (ref 2.3–4.6)
Potassium: 3.1 mmol/L — ABNORMAL LOW (ref 3.5–5.1)
Sodium: 132 mmol/L — ABNORMAL LOW (ref 135–145)

## 2014-11-05 LAB — URINE CULTURE
Colony Count: NO GROWTH
Culture: NO GROWTH

## 2014-11-05 LAB — CBC
HCT: 31.8 % — ABNORMAL LOW (ref 39.0–52.0)
Hemoglobin: 10.8 g/dL — ABNORMAL LOW (ref 13.0–17.0)
MCH: 30.3 pg (ref 26.0–34.0)
MCHC: 34 g/dL (ref 30.0–36.0)
MCV: 89.3 fL (ref 78.0–100.0)
Platelets: 155 10*3/uL (ref 150–400)
RBC: 3.56 MIL/uL — ABNORMAL LOW (ref 4.22–5.81)
RDW: 12.6 % (ref 11.5–15.5)
WBC: 7.3 10*3/uL (ref 4.0–10.5)

## 2014-11-05 LAB — HIV ANTIBODY (ROUTINE TESTING W REFLEX)
HIV 1/O/2 Abs-Index Value: <1 (ref <1.00)
HIV-1/HIV-2 Ab: NONREACTIVE

## 2014-11-05 MED ORDER — GABAPENTIN 300 MG PO CAPS
300.0000 mg | ORAL_CAPSULE | Freq: Two times a day (BID) | ORAL | Status: DC
  Administered 2014-11-05 – 2014-11-07 (×5): 300 mg via ORAL
  Filled 2014-11-05 (×6): qty 1

## 2014-11-05 MED ORDER — MENTHOL 3 MG MT LOZG
1.0000 | LOZENGE | OROMUCOSAL | Status: DC | PRN
  Administered 2014-11-06 – 2014-11-07 (×4): 3 mg via ORAL
  Filled 2014-11-05 (×2): qty 9

## 2014-11-05 MED ORDER — HYDROCODONE-HOMATROPINE 5-1.5 MG/5ML PO SYRP
5.0000 mL | ORAL_SOLUTION | Freq: Three times a day (TID) | ORAL | Status: DC
  Administered 2014-11-05 – 2014-11-07 (×7): 5 mL via ORAL
  Filled 2014-11-05 (×7): qty 5

## 2014-11-05 MED ORDER — BENZONATATE 100 MG PO CAPS
100.0000 mg | ORAL_CAPSULE | Freq: Three times a day (TID) | ORAL | Status: DC
  Administered 2014-11-05 – 2014-11-07 (×7): 100 mg via ORAL
  Filled 2014-11-05 (×9): qty 1

## 2014-11-05 MED ORDER — PRAVASTATIN SODIUM 20 MG PO TABS
20.0000 mg | ORAL_TABLET | Freq: Every day | ORAL | Status: DC
  Administered 2014-11-05 – 2014-11-06 (×2): 20 mg via ORAL
  Filled 2014-11-05 (×3): qty 1

## 2014-11-05 NOTE — Procedures (Signed)
Pt seen on HD. Ap 140 Vp 170.  BFR 400.  Feels OK but still with cough.

## 2014-11-05 NOTE — Progress Notes (Signed)
Patient ID: James Nicholson  male  [id number]    DOB: Jul 04, 1966    DOA: 11/03/2014  PCP: [name], MD   Brief history of present illness  49 year old male with ESRD on hemodialysis, TTS presented to ED after dialysis on 1/7. Patient's brother had noted that he was having productive cough and shortness of breath. Patient is from a facility hence admitted as HCAP.   Assessment/Plan: Principal Problem:   HCAP (healthcare-associated pneumonia), SIRS - Continue IV vancomycin and cefepime, placed on symptomatically treatment for cough with Tessalon, Hycodan, Cepacol - Continue flutter valve, O2  Active Problems:      End stage renal disease - Hemodialysis  TTS, renal consulted, patient undergoing hemodialysis today per his schedule    IDDM (insulin dependent diabetes mellitus) -Continue Levemir, sliding-scale insulin.   Hyperlipidemia  Continue statin  DVT Prophylaxis:Heparin subcutaneous  Code Status:  Family Communication:  Disposition:Back to facility likely on Monday  Consultants: Nephrology   Procedures: Hemodialysis   Antibiotics:  IV vancomycin  IV cefepime  Subjective: Patient seen and examined, in hemodialysis, no complaints except coughing   Objective: Weight change:   Intake/Output Summary (Last 24 hours) at 11/05/14 1319 Last data filed at 11/05/14 1218  Gross per 24 hour  Intake    960 ml  Output   2240 ml  Net  -1280 ml   Blood pressure 114/73, pulse 92, temperature 99 F (37.2 C), temperature source Oral, resp. rate 20, height 5\' 9"  (1.753 m), weight 75.6 kg (166 lb 10.7 oz), SpO2 95 %.  Physical Exam: General: Alert and awake, oriented x3, not in any acute distress. HEENT: anicteric sclera, PERLA, EOMI CVS: S1-S2 clear, no murmur rubs or gallops Chest:Decreased breath sounds at the bases  Abdomen: soft nontender, nondistended, normal bowel sounds  Extremities: no cyanosis, clubbing or edema noted bilaterally Neuro: Cranial  nerves II-XII intact, no focal neurological deficits  Lab Results: Basic Metabolic Panel:  Recent Labs Lab 11/04/14 0520 11/05/14 0759  NA 135 132*  K 3.1* 3.1*  CL 91* 91*  CO2 28 25  GLUCOSE 98 88  BUN 28* 57*  CREATININE 5.91* 7.35*  CALCIUM 8.2* 8.3*  PHOS  --  2.6   Liver Function Tests:  Recent Labs Lab 11/03/14 1827 11/05/14 0759  AST 47*  --   ALT 38  --   ALKPHOS 53  --   BILITOT 0.8  --   PROT 8.3  --   ALBUMIN 4.0 2.9*   No results for input(s): LIPASE, AMYLASE in the last 168 hours. No results for input(s): AMMONIA in the last 168 hours. CBC:  Recent Labs Lab 11/03/14 1744 11/05/14 0759  WBC 8.8 7.3  HGB 13.7 10.8*  HCT 40.1 31.8*  MCV 90.5 89.3  PLT 154 155   Cardiac Enzymes: No results for input(s): CKTOTAL, CKMB, CKMBINDEX, TROPONINI in the last 168 hours. BNP: Invalid input(s): POCBNP CBG:  Recent Labs Lab 11/04/14 0740 11/04/14 1210 11/04/14 1621 11/04/14 2206 11/05/14 0737  GLUCAP 101* 127* 112* 120* 95     Micro Results: Recent Results (from the past 240 hour(s))  Blood Culture (routine x 2)     Status: None (Preliminary result)   Collection Time: 11/03/14  6:05 PM  Result Value Ref Range Status   Specimen Description BLOOD ARM LEFT  Final   Special Requests BOTTLES DRAWN AEROBIC AND ANAEROBIC 5CC  Final   Culture   Final           BLOOD CULTURE RECEIVED  NO GROWTH TO DATE CULTURE WILL BE HELD FOR 5 DAYS BEFORE ISSUING A FINAL NEGATIVE REPORT Performed at [facility]    Report Status PENDING  Incomplete  Blood Culture (routine x 2)     Status: None (Preliminary result)   Collection Time: 11/03/14  6:19 PM  Result Value Ref Range Status   Specimen Description BLOOD HAND LEFT  Final   Special Requests BOTTLES DRAWN AEROBIC AND ANAEROBIC 5CC  Final   Culture   Final           BLOOD CULTURE RECEIVED NO GROWTH TO DATE CULTURE WILL BE HELD FOR 5 DAYS BEFORE ISSUING A FINAL NEGATIVE REPORT Performed at [facility]    Report Status PENDING  Incomplete  MRSA PCR Screening     Status: None   Collection Time: 11/03/14 10:49 PM  Result Value Ref Range Status   MRSA by PCR NEGATIVE NEGATIVE Final    Comment:        The GeneXpert MRSA Assay (FDA approved for NASAL specimens only), is one component of a comprehensive MRSA colonization surveillance program. It is not intended to diagnose MRSA infection nor to guide or monitor treatment for MRSA infections.     Studies/Results: Dg Chest Port 1 View  11/03/2014   CLINICAL DATA:  Chest pain. Onset of symptoms yesterday. Dialysis patient.  EXAM: PORTABLE CHEST - 1 VIEW  COMPARISON:  Chest radiograph 12/21/2013.  FINDINGS: Unchanged LEFT subclavian AICD. Low lung volumes are present. There is subsegmental atelectasis at the RIGHT lung base which is discoid in configuration. No focal airspace consolidation identified. Monitoring leads project over the chest. The cardiopericardial silhouette is within normal limits. Mediastinal contours are normal.  IMPRESSION: No acute cardiopulmonary disease. RIGHT basilar discoid atelectasis.   Electronically Signed   By: Dereck Ligas M.D.   On: 11/03/2014 19:36    Medications: Scheduled Meds: . aspirin EC  81 mg Oral Daily  . benzonatate  100 mg Oral TID  . calcitRIOL  1 mcg Oral Q T,Th,Sa-HD  . calcium acetate  1,334 mg Oral TID WC  . ceFEPime (MAXIPIME) IV  2 g Intravenous Q T,Th,Sa-HD  . feeding supplement (NEPRO CARB STEADY)  237 mL Oral BID BM  . [START ON 11/10/2014] ferric gluconate (FERRLECIT/NULECIT) IV  62.5 mg Intravenous Q Thu-HD  . heparin  5,000 Units Subcutaneous 3 times per day  . HYDROcodone-homatropine  5 mL Oral Q8H  . insulin aspart  0-5 Units Subcutaneous QHS  . insulin aspart  0-9 Units Subcutaneous TID WC  . insulin detemir  5 Units Subcutaneous QHS  . multivitamin with minerals  1 tablet Oral Daily  . pantoprazole  40 mg Oral Daily  . risperiDONE  0.25 mg Oral QHS  . vancomycin   750 mg Intravenous Q T,Th,Sa-HD      LOS: 2 days   RAI,RIPUDEEP M.D. [facility] 11/05/2014, 1:19 PM Pager: [phone]  If 7PM-7AM, please contact night-coverage [email or web address] Password [email or web address]

## 2014-11-05 NOTE — Progress Notes (Signed)
Clinical Social Work Department CLINICAL SOCIAL WORK PLACEMENT NOTE 11/05/2014  Patient:  James Nicholson, James Nicholson  Account Number:  [id number] Admit date:  11/03/2014  Clinical Social Worker:  Barbette Or, LCSW  Date/time:  11/04/2014 04:00 PM  Clinical Social Work is seeking post-discharge placement for this patient at the following level of care:   [place]   (*CSW will update this form in Epic as items are completed)   11/04/2014  Patient/family provided with [place] Department of Clinical Social Work's list of facilities offering this level of care within the geographic area requested by the patient (or if unable, by the patient's family).  11/04/2014  Patient/family informed of their freedom to choose among providers that offer the needed level of care, that participate in Medicare, Medicaid or managed care program needed by the patient, have an available bed and are willing to accept the patient.  11/04/2014  Patient/family informed of [facility]' ownership interest in [facility], as well as of the fact that they are under no obligation to receive care at this facility.  PASARR submitted to EDS on  PASARR number received on   FL2 transmitted to all facilities in geographic area requested by pt/family on  11/04/2014 FL2 transmitted to all facilities within larger geographic area on   Patient informed that his/her managed care company has contracts with or will negotiate with  certain facilities, including the following:     Patient/family informed of bed offers received:  11/05/2014 Patient chooses bed at [facility] Physician recommends and patient chooses bed at    Patient to be transferred to  on   Patient to be transferred to facility by  Patient and family notified of transfer on  Name of family member notified:    The following physician request were entered in Epic:   Additional Comments: 01/08 Patient will likely need new  SNF pasarr

## 2014-11-06 LAB — GLUCOSE, CAPILLARY
Glucose-Capillary: 106 mg/dL — ABNORMAL HIGH (ref 70–99)
Glucose-Capillary: 95 mg/dL (ref 70–99)
Glucose-Capillary: 114 mg/dL — ABNORMAL HIGH (ref 70–99)
Glucose-Capillary: 104 mg/dL — ABNORMAL HIGH (ref 70–99)
Glucose-Capillary: 128 mg/dL — ABNORMAL HIGH (ref 70–99)

## 2014-11-06 LAB — BASIC METABOLIC PANEL
Anion gap: 10 (ref 5–15)
BUN: 38 mg/dL — ABNORMAL HIGH (ref 6–23)
CO2: 29 mmol/L (ref 19–32)
Calcium: 8.7 mg/dL (ref 8.4–10.5)
Chloride: 98 mEq/L (ref 96–112)
Creatinine, Ser: 5.71 mg/dL — ABNORMAL HIGH (ref 0.50–1.35)
GFR calc Af Amer: 12 mL/min — ABNORMAL LOW (ref >90)
GFR calc non Af Amer: 11 mL/min — ABNORMAL LOW (ref >90)
Glucose, Bld: 89 mg/dL (ref 70–99)
Potassium: 3.7 mmol/L (ref 3.5–5.1)
Sodium: 137 mmol/L (ref 135–145)

## 2014-11-06 LAB — CBC
HCT: 34.1 % — ABNORMAL LOW (ref 39.0–52.0)
Hemoglobin: 11.2 g/dL — ABNORMAL LOW (ref 13.0–17.0)
MCH: 29.9 pg (ref 26.0–34.0)
MCHC: 32.8 g/dL (ref 30.0–36.0)
MCV: 91.2 fL (ref 78.0–100.0)
Platelets: 185 10*3/uL (ref 150–400)
RBC: 3.74 MIL/uL — ABNORMAL LOW (ref 4.22–5.81)
RDW: 12.9 % (ref 11.5–15.5)
WBC: 7.6 10*3/uL (ref 4.0–10.5)

## 2014-11-06 NOTE — Progress Notes (Signed)
S: eating well.  Still with cough O:BP 96/59 mmHg  Pulse 84  Temp(Src) 98.3 F (36.8 C) (Oral)  Resp 18  Ht 5\' 9"  (1.753 m)  Wt 75.6 kg (166 lb 10.7 oz)  BMI 24.60 kg/m2  SpO2 92%  Intake/Output Summary (Last 24 hours) at 11/06/14 0835 Last data filed at 11/06/14 0530  Gross per 24 hour  Intake    522 ml  Output   2050 ml  Net  -1528 ml   Weight change:  [id number] and alert CVS:RRR Resp:Bil crackles R>L Abd:+ BS NTND Ext:no edema  Rt AVF + bruit NEURO:CNI Ox3 No asterixis   . aspirin EC  81 mg Oral Daily  . benzonatate  100 mg Oral 3 times per day  . calcitRIOL  1 mcg Oral Q Nicholson,Th,Sa-HD  . calcium acetate  1,334 mg Oral TID WC  . ceFEPime (MAXIPIME) IV  2 g Intravenous Q Nicholson,Th,Sa-HD  . feeding supplement (NEPRO CARB STEADY)  237 mL Oral BID BM  . [START ON 11/10/2014] ferric gluconate (FERRLECIT/NULECIT) IV  62.5 mg Intravenous Q Thu-HD  . gabapentin  300 mg Oral BID  . heparin  5,000 Units Subcutaneous 3 times per day  . HYDROcodone-homatropine  5 mL Oral Q8H  . insulin aspart  0-5 Units Subcutaneous QHS  . insulin aspart  0-9 Units Subcutaneous TID WC  . insulin detemir  5 Units Subcutaneous QHS  . multivitamin with minerals  1 tablet Oral Daily  . pantoprazole  40 mg Oral Daily  . pravastatin  20 mg Oral QHS  . risperiDONE  0.25 mg Oral QHS  . vancomycin  750 mg Intravenous Q Nicholson,Th,Sa-HD   No results found. BMET    Component Value Date/Time   NA 137 11/06/2014 0525   K 3.7 11/06/2014 0525   CL 98 11/06/2014 0525   CO2 29 11/06/2014 0525   GLUCOSE 89 11/06/2014 0525   BUN 38* 11/06/2014 0525   CREATININE 5.71* 11/06/2014 0525   CREATININE 1.15 05/22/2012 1052   CALCIUM 8.7 11/06/2014 0525   CALCIUM 8.3* 02/05/2013 1900   GFRNONAA 11* 11/06/2014 0525   GFRNONAA 76 05/22/2012 1052   GFRAA 12* 11/06/2014 0525   GFRAA 88 05/22/2012 1052   CBC    Component Value Date/Time   WBC 7.6 11/06/2014 0525   RBC 3.74* 11/06/2014 0525   HGB 11.2* 11/06/2014 0525    HCT 34.1* 11/06/2014 0525   PLT 185 11/06/2014 0525   MCV 91.2 11/06/2014 0525   MCH 29.9 11/06/2014 0525   MCHC 32.8 11/06/2014 0525   RDW 12.9 11/06/2014 0525   LYMPHSABS 1.7 03/12/2013 1248   MONOABS 0.7 03/12/2013 1248   EOSABS 0.0 03/12/2013 1248   BASOSABS 0.0 03/12/2013 1248     Assessment:  1. PNA 2. Sec HPTH on calcitriol 3. ESRD  Plan: 1. Next HD tues 2. Note plans for short term SNF 3. Could switch to PO AB  James Nicholson

## 2014-11-06 NOTE — Progress Notes (Signed)
Patient ID: James Nicholson  male  [id number]    DOB: November 02, 1965    DOA: 11/03/2014  PCP: [name], MD   Brief history of present illness  49 year old male with ESRD on hemodialysis, TTS presented to ED after dialysis on 1/7. Patient's brother had noted that he was having productive cough and shortness of breath. Patient is from a facility hence admitted as HCAP.   Assessment/Plan: Principal Problem:   HCAP (healthcare-associated pneumonia), SIRS - Continue IV vancomycin and cefepime, placed on symptomatically treatment for cough with Tessalon, Hycodan, Cepacol - Continue flutter valve, O2 - Per patient, coughing is improving after started on antitussives  Active Problems:      End stage renal disease - Hemodialysis  TTS, renal consulted, patient undergoing hemodialysis    IDDM (insulin dependent diabetes mellitus) -Continue Levemir, sliding-scale insulin. CBGs controlled.  Hyperlipidemia  Continue statin  DVT Prophylaxis:Heparin subcutaneous  Code Status:  Family Communication:  Disposition: Hopefully DC to facility tomorrow, can change to antibiotics with hemodialysis per week to complete a course for HCAP  Consultants: Nephrology   Procedures: Hemodialysis   Antibiotics:  IV vancomycin  IV cefepime  Subjective: Patient seen and examined, coughing is improving from yesterday, no fevers or chills  Objective: Weight change:   Intake/Output Summary (Last 24 hours) at 11/06/14 1105 Last data filed at 11/06/14 1019  Gross per 24 hour  Intake    762 ml  Output   2050 ml  Net  -1288 ml   Blood pressure 96/59, pulse 84, temperature 98.3 F (36.8 C), temperature source Oral, resp. rate 18, height 5\' 9"  (1.753 m), weight 75.6 kg (166 lb 10.7 oz), SpO2 92 %.  Physical Exam: General: Alert and awake, oriented x3, not in any acute distress. CVS: S1-S2 clear, no murmur rubs or gallops Chest:Decreased breath sounds at the bases  Abdomen: soft  nontender, nondistended, normal bowel sounds  Extremities: no cyanosis, clubbing or edema noted bilaterally   Lab Results: Basic Metabolic Panel:  Recent Labs Lab 11/05/14 0759 11/06/14 0525  NA 132* 137  K 3.1* 3.7  CL 91* 98  CO2 25 29  GLUCOSE 88 89  BUN 57* 38*  CREATININE 7.35* 5.71*  CALCIUM 8.3* 8.7  PHOS 2.6  --    Liver Function Tests:  Recent Labs Lab 11/03/14 1827 11/05/14 0759  AST 47*  --   ALT 38  --   ALKPHOS 53  --   BILITOT 0.8  --   PROT 8.3  --   ALBUMIN 4.0 2.9*   No results for input(s): LIPASE, AMYLASE in the last 168 hours. No results for input(s): AMMONIA in the last 168 hours. CBC:  Recent Labs Lab 11/05/14 0759 11/06/14 0525  WBC 7.3 7.6  HGB 10.8* 11.2*  HCT 31.8* 34.1*  MCV 89.3 91.2  PLT 155 185   Cardiac Enzymes: No results for input(s): CKTOTAL, CKMB, CKMBINDEX, TROPONINI in the last 168 hours. BNP: Invalid input(s): POCBNP CBG:  Recent Labs Lab 11/04/14 2206 11/05/14 0737 11/05/14 1801 11/05/14 2102 11/06/14 0832  GLUCAP 120* 95 106* 108* 95     Micro Results: Recent Results (from the past 240 hour(s))  Blood Culture (routine x 2)     Status: None (Preliminary result)   Collection Time: 11/03/14  6:05 PM  Result Value Ref Range Status   Specimen Description BLOOD ARM LEFT  Final   Special Requests BOTTLES DRAWN AEROBIC AND ANAEROBIC 5CC  Final   Culture   Final  BLOOD CULTURE RECEIVED NO GROWTH TO DATE CULTURE WILL BE HELD FOR 5 DAYS BEFORE ISSUING A FINAL NEGATIVE REPORT Performed at [facility]    Report Status PENDING  Incomplete  Blood Culture (routine x 2)     Status: None (Preliminary result)   Collection Time: 11/03/14  6:19 PM  Result Value Ref Range Status   Specimen Description BLOOD HAND LEFT  Final   Special Requests BOTTLES DRAWN AEROBIC AND ANAEROBIC 5CC  Final   Culture   Final           BLOOD CULTURE RECEIVED NO GROWTH TO DATE CULTURE WILL BE HELD FOR 5 DAYS BEFORE  ISSUING A FINAL NEGATIVE REPORT Performed at [facility]    Report Status PENDING  Incomplete  MRSA PCR Screening     Status: None   Collection Time: 11/03/14 10:49 PM  Result Value Ref Range Status   MRSA by PCR NEGATIVE NEGATIVE Final    Comment:        The GeneXpert MRSA Assay (FDA approved for NASAL specimens only), is one component of a comprehensive MRSA colonization surveillance program. It is not intended to diagnose MRSA infection nor to guide or monitor treatment for MRSA infections.   Urine culture     Status: None   Collection Time: 11/04/14  9:52 AM  Result Value Ref Range Status   Specimen Description URINE, RANDOM  Final   Special Requests NONE  Final   Colony Count NO GROWTH Performed at [facility]   Final   Culture NO GROWTH Performed at [facility]   Final   Report Status 11/05/2014 FINAL  Final    Studies/Results: Dg Chest Port 1 View  11/03/2014   CLINICAL DATA:  Chest pain. Onset of symptoms yesterday. Dialysis patient.  EXAM: PORTABLE CHEST - 1 VIEW  COMPARISON:  Chest radiograph 12/21/2013.  FINDINGS: Unchanged LEFT subclavian AICD. Low lung volumes are present. There is subsegmental atelectasis at the RIGHT lung base which is discoid in configuration. No focal airspace consolidation identified. Monitoring leads project over the chest. The cardiopericardial silhouette is within normal limits. Mediastinal contours are normal.  IMPRESSION: No acute cardiopulmonary disease. RIGHT basilar discoid atelectasis.   Electronically Signed   By: Dereck Ligas M.D.   On: 11/03/2014 19:36    Medications: Scheduled Meds: . aspirin EC  81 mg Oral Daily  . benzonatate  100 mg Oral 3 times per day  . calcitRIOL  1 mcg Oral Q T,Th,Sa-HD  . calcium acetate  1,334 mg Oral TID WC  . ceFEPime (MAXIPIME) IV  2 g Intravenous Q T,Th,Sa-HD  . feeding supplement (NEPRO CARB STEADY)  237 mL Oral BID BM  . [START ON 11/10/2014] ferric gluconate  (FERRLECIT/NULECIT) IV  62.5 mg Intravenous Q Thu-HD  . gabapentin  300 mg Oral BID  . heparin  5,000 Units Subcutaneous 3 times per day  . HYDROcodone-homatropine  5 mL Oral Q8H  . insulin aspart  0-5 Units Subcutaneous QHS  . insulin aspart  0-9 Units Subcutaneous TID WC  . insulin detemir  5 Units Subcutaneous QHS  . multivitamin with minerals  1 tablet Oral Daily  . pantoprazole  40 mg Oral Daily  . pravastatin  20 mg Oral QHS  . risperiDONE  0.25 mg Oral QHS  . vancomycin  750 mg Intravenous Q T,Th,Sa-HD      LOS: 3 days   RAI,RIPUDEEP M.D. [facility] 11/06/2014, 11:05 AM Pager: [phone]  If 7PM-7AM, please  contact night-coverage [email or web address] Password [email or web address]

## 2014-11-06 NOTE — Progress Notes (Signed)
ANTIBIOTIC CONSULT NOTE   Pharmacy Consult for Vancomycin & Cefepime Indication: Pneumonia  No Known Allergies  Labs:  Recent Labs  11/03/14 1744  11/04/14 0520 11/05/14 0759 11/06/14 0525  WBC 8.8  --   --  7.3 7.6  HGB 13.7  --   --  10.8* 11.2*  PLT 154  --   --  155 185  CREATININE 4.30*  < > 5.91* 7.35* 5.71*  < > = values in this interval not displayed. Estimated Creatinine Clearance: 15.8 mL/min (by C-G formula based on Cr of 5.71). No results for input(s): VANCOTROUGH, VANCOPEAK, VANCORANDOM, GENTTROUGH, GENTPEAK, GENTRANDOM, TOBRATROUGH, TOBRAPEAK, TOBRARND, AMIKACINPEAK, AMIKACINTROU, AMIKACIN in the last 72 hours.   Microbiology: Recent Results (from the past 720 hour(s))  Blood Culture (routine x 2)     Status: None (Preliminary result)   Collection Time: 11/03/14  6:05 PM  Result Value Ref Range Status   Specimen Description BLOOD ARM LEFT  Final   Special Requests BOTTLES DRAWN AEROBIC AND ANAEROBIC 5CC  Final   Culture   Final           BLOOD CULTURE RECEIVED NO GROWTH TO DATE CULTURE WILL BE HELD FOR 5 DAYS BEFORE ISSUING A FINAL NEGATIVE REPORT Performed at [facility]    Report Status PENDING  Incomplete  Blood Culture (routine x 2)     Status: None (Preliminary result)   Collection Time: 11/03/14  6:19 PM  Result Value Ref Range Status   Specimen Description BLOOD HAND LEFT  Final   Special Requests BOTTLES DRAWN AEROBIC AND ANAEROBIC 5CC  Final   Culture   Final           BLOOD CULTURE RECEIVED NO GROWTH TO DATE CULTURE WILL BE HELD FOR 5 DAYS BEFORE ISSUING A FINAL NEGATIVE REPORT Performed at [facility]    Report Status PENDING  Incomplete  MRSA PCR Screening     Status: None   Collection Time: 11/03/14 10:49 PM  Result Value Ref Range Status   MRSA by PCR NEGATIVE NEGATIVE Final    Comment:        The GeneXpert MRSA Assay (FDA approved for NASAL specimens only), is one component of a comprehensive MRSA  colonization surveillance program. It is not intended to diagnose MRSA infection nor to guide or monitor treatment for MRSA infections.   Urine culture     Status: None   Collection Time: 11/04/14  9:52 AM  Result Value Ref Range Status   Specimen Description URINE, RANDOM  Final   Special Requests NONE  Final   Colony Count NO GROWTH Performed at [facility]   Final   Culture NO GROWTH Performed at [facility]   Final   Report Status 11/05/2014 FINAL  Final    Assessment: 52 YOM with PMH of ESRD on dialysis (per pt family gets dialysis Tues, Thurs, Sat) continues on vancomycin and cefepime for HCAP (Day 4) Slowly improving WBC WNL, afebrile, cultures negative Doses charted after HD sessions  Goal of Therapy:  Target pre-HD level 15-25 mcg/mL Target post-HD level 5-15 mcg/mL  Plan:  Continue Vancomycin 750 mg iv Q HD Continue Cefepime 2 grams iv Q HD  Follow up for completion of course at HD or change to po antibiotics  Thank you. Anette Guarneri, PharmD [phone] 11/06/2014 2:39 PM

## 2014-11-07 DIAGNOSIS — J189 Pneumonia, unspecified organism: Secondary | ICD-10-CM | POA: Diagnosis not present

## 2014-11-07 DIAGNOSIS — N2581 Secondary hyperparathyroidism of renal origin: Secondary | ICD-10-CM | POA: Diagnosis not present

## 2014-11-07 DIAGNOSIS — R488 Other symbolic dysfunctions: Secondary | ICD-10-CM | POA: Diagnosis not present

## 2014-11-07 DIAGNOSIS — E1129 Type 2 diabetes mellitus with other diabetic kidney complication: Secondary | ICD-10-CM | POA: Diagnosis not present

## 2014-11-07 DIAGNOSIS — M3130 Wegener's granulomatosis without renal involvement: Secondary | ICD-10-CM | POA: Diagnosis not present

## 2014-11-07 DIAGNOSIS — D631 Anemia in chronic kidney disease: Secondary | ICD-10-CM | POA: Diagnosis not present

## 2014-11-07 DIAGNOSIS — E119 Type 2 diabetes mellitus without complications: Secondary | ICD-10-CM | POA: Diagnosis not present

## 2014-11-07 DIAGNOSIS — I120 Hypertensive chronic kidney disease with stage 5 chronic kidney disease or end stage renal disease: Secondary | ICD-10-CM | POA: Diagnosis not present

## 2014-11-07 DIAGNOSIS — N189 Chronic kidney disease, unspecified: Secondary | ICD-10-CM | POA: Diagnosis not present

## 2014-11-07 DIAGNOSIS — Z95810 Presence of automatic (implantable) cardiac defibrillator: Secondary | ICD-10-CM | POA: Diagnosis not present

## 2014-11-07 DIAGNOSIS — E785 Hyperlipidemia, unspecified: Secondary | ICD-10-CM | POA: Diagnosis not present

## 2014-11-07 DIAGNOSIS — Z992 Dependence on renal dialysis: Secondary | ICD-10-CM | POA: Diagnosis not present

## 2014-11-07 DIAGNOSIS — Z794 Long term (current) use of insulin: Secondary | ICD-10-CM | POA: Diagnosis not present

## 2014-11-07 DIAGNOSIS — Z7982 Long term (current) use of aspirin: Secondary | ICD-10-CM | POA: Diagnosis not present

## 2014-11-07 DIAGNOSIS — N186 End stage renal disease: Secondary | ICD-10-CM | POA: Diagnosis not present

## 2014-11-07 DIAGNOSIS — M6281 Muscle weakness (generalized): Secondary | ICD-10-CM | POA: Diagnosis not present

## 2014-11-07 DIAGNOSIS — R05 Cough: Secondary | ICD-10-CM | POA: Diagnosis not present

## 2014-11-07 DIAGNOSIS — R0602 Shortness of breath: Secondary | ICD-10-CM | POA: Diagnosis not present

## 2014-11-07 DIAGNOSIS — R509 Fever, unspecified: Secondary | ICD-10-CM | POA: Diagnosis not present

## 2014-11-07 LAB — CBC
HCT: 33.1 % — ABNORMAL LOW (ref 39.0–52.0)
Hemoglobin: 10.9 g/dL — ABNORMAL LOW (ref 13.0–17.0)
MCH: 29.9 pg (ref 26.0–34.0)
MCHC: 32.9 g/dL (ref 30.0–36.0)
MCV: 90.9 fL (ref 78.0–100.0)
Platelets: 207 10*3/uL (ref 150–400)
RBC: 3.64 MIL/uL — ABNORMAL LOW (ref 4.22–5.81)
RDW: 12.7 % (ref 11.5–15.5)
WBC: 6.2 10*3/uL (ref 4.0–10.5)

## 2014-11-07 LAB — BASIC METABOLIC PANEL
Anion gap: 9 (ref 5–15)
BUN: 61 mg/dL — ABNORMAL HIGH (ref 6–23)
CO2: 27 mmol/L (ref 19–32)
Calcium: 8.6 mg/dL (ref 8.4–10.5)
Chloride: 98 mEq/L (ref 96–112)
Creatinine, Ser: 7.02 mg/dL — ABNORMAL HIGH (ref 0.50–1.35)
GFR calc Af Amer: 10 mL/min — ABNORMAL LOW (ref >90)
GFR calc non Af Amer: 8 mL/min — ABNORMAL LOW (ref >90)
Glucose, Bld: 93 mg/dL (ref 70–99)
Potassium: 3.9 mmol/L (ref 3.5–5.1)
Sodium: 134 mmol/L — ABNORMAL LOW (ref 135–145)

## 2014-11-07 LAB — GLUCOSE, CAPILLARY
Glucose-Capillary: 84 mg/dL (ref 70–99)
Glucose-Capillary: 117 mg/dL — ABNORMAL HIGH (ref 70–99)
Glucose-Capillary: 109 mg/dL — ABNORMAL HIGH (ref 70–99)

## 2014-11-07 MED ORDER — BENZONATATE 100 MG PO CAPS: 100.0000 mg | ORAL_CAPSULE | Freq: Three times a day (TID) | ORAL | Status: DC | PRN

## 2014-11-07 MED ORDER — ACETAMINOPHEN 325 MG PO TABS: 650.0000 mg | ORAL_TABLET | Freq: Four times a day (QID) | ORAL | Status: DC | PRN

## 2014-11-07 MED ORDER — CALCITRIOL 0.5 MCG PO CAPS: 1.0000 ug | ORAL_CAPSULE | ORAL | Status: DC

## 2014-11-07 MED ORDER — LEVOFLOXACIN 500 MG PO TABS
500.0000 mg | ORAL_TABLET | ORAL | Status: DC
  Administered 2014-11-07: 500 mg via ORAL
  Filled 2014-11-07: qty 1

## 2014-11-07 MED ORDER — BOOST / RESOURCE BREEZE PO LIQD: 1.0000 | Freq: Three times a day (TID) | ORAL | Status: DC

## 2014-11-07 MED ORDER — SODIUM CHLORIDE 0.9 % IV SOLN: 62.5000 mg | INTRAVENOUS | Status: DC

## 2014-11-07 MED ORDER — LEVOFLOXACIN 500 MG PO TABS
500.0000 mg | ORAL_TABLET | ORAL | Status: AC
Start: 2014-11-07 — End: 2014-11-09

## 2014-11-07 MED ORDER — BOOST / RESOURCE BREEZE PO LIQD
1.0000 | Freq: Three times a day (TID) | ORAL | Status: DC
  Administered 2014-11-07: 1 via ORAL

## 2014-11-07 MED ORDER — MENTHOL 3 MG MT LOZG: 1.0000 | LOZENGE | OROMUCOSAL | Status: DC | PRN

## 2014-11-07 MED ORDER — CALCIUM CARBONATE ANTACID 500 MG PO CHEW: 1.0000 | CHEWABLE_TABLET | Freq: Two times a day (BID) | ORAL | Status: DC | PRN

## 2014-11-07 NOTE — Care Management Note (Signed)
    Page 1 of 1   11/07/2014     12:10:33 PM CARE MANAGEMENT NOTE 11/07/2014  Patient:  MATT, ENGEN   Account Number:  [id number]  Date Initiated:  11/07/2014  Documentation initiated by:  Tomi Bamberger  Subjective/Objective Assessment:   dc hcap  admit     Action/Plan:   pt eval- rec snf   Anticipated DC Date:  11/07/2014   Anticipated DC Plan:  SKILLED NURSING FACILITY  In-house referral  Clinical Social Worker      DC Planning Services  CM consult      Choice offered to / List presented to:             Status of service:  Completed, signed off Medicare Important Message given?  YES (If response is "NO", the following Medicare IM given date fields will be blank) Date Medicare IM given:  11/07/2014 Medicare IM given by:  Tomi Bamberger Date Additional Medicare IM given:   Additional Medicare IM given by:    Discharge Disposition:  Stamps  Per UR Regulation:  Reviewed for med. necessity/level of care/duration of stay  If discussed at [place] of Stay Meetings, dates discussed:    Comments:  11/07/14 Tomi Bamberger RN, BSN [phone]  patient for dc to [facility] today.

## 2014-11-07 NOTE — Progress Notes (Signed)
Subjective:  "Some cough but better"  Objective Vital signs in last 24 hours: Filed Vitals:   11/06/14 0612 11/06/14 1513 11/06/14 2142 11/07/14 0641  BP: 96/59 98/68 104/53 97/62  Pulse: 84 86 75 70  Temp: 98.3 F (36.8 C) 98.7 F (37.1 C) 98.1 F (36.7 C) 98 F (36.7 C)  TempSrc: Oral Oral Oral Oral  Resp: 18 16 18 14   Height:      Weight:      SpO2: 92% 95% 95% 95%   Weight change:   Physical Exam:  General: up in bedside chair looking out window/ Alert, NAD, OX3 Heart: RRR, no rub, mur, or gallop Lungs: R>L Crackles Abdomen: BS Pos. , soft , NT, ND Extremities: No pedal edema Dialysis Access: Pos. Bruit RFA AVF  Dialysis Orders: Center: [facility] on tts . EDW 76.5kg HD Bath 2.ok, 2.o ca Time 4hrs Heparin 3,000. Access RFA AVF  Calcitriol 1.0 mcg IV/HD Units IV/HD Venofer 50mg  q thurs hd  Other op labs= HBG 13.6/ tfs 38%/ pth 307 Last ca 9.3 Phos 3.5   Problem/Plan: 1. HCAP- on iv Vanco  And Maxipime  2. Hypokalemia- K 3.9 3. ESRD - Next hd= Tues/ TTS // check k pre hd use 3.o k bath 4. Hypertension/volume - bp 97/62 No bp meds/ last wt 1/9 = 1 kg below edw 75.6 5. Anemia - no esa with 13.7  hgb admit  To 10.9  Today ,reck pre hd may need to start aranesp/ weekly iron  6. Metabolic bone disease - Po caltriol. phoslo =  phos 2.9 11/05/14, ca corrected 9.4/ reck pre hd in am 7. HO Schizophrenia- At [facility] , on po Risperdal/ has been very stable at op [facility]  8. Nutrition - carb renal diet / renal vit add breeze supplement with alb down to 2.9  Ernest Haber, PA-C [place] [phone] 11/07/2014,11:03 AM  LOS: 4 days   Pt seen, examined and agree w A/P as above.  Kelly Splinter MD pager [phone]    cell [phone] 11/07/2014, 4:13 PM    Labs: Basic Metabolic Panel:  Recent Labs Lab 11/05/14 0759 11/06/14 0525 11/07/14 0507  NA 132* 137 134*  K 3.1* 3.7 3.9  CL 91* 98 98  CO2 25 29 27   GLUCOSE 88 89 93  BUN  57* 38* 61*  CREATININE 7.35* 5.71* 7.02*  CALCIUM 8.3* 8.7 8.6  PHOS 2.6  --   --    Liver Function Tests:  Recent Labs Lab 11/03/14 1827 11/05/14 0759  AST 47*  --   ALT 38  --   ALKPHOS 53  --   BILITOT 0.8  --   PROT 8.3  --   ALBUMIN 4.0 2.9*  CBC:  Recent Labs Lab 11/03/14 1744 11/05/14 0759 11/06/14 0525 11/07/14 0507  WBC 8.8 7.3 7.6 6.2  HGB 13.7 10.8* 11.2* 10.9*  HCT 40.1 31.8* 34.1* 33.1*  MCV 90.5 89.3 91.2 90.9  PLT 154 155 185 207  CBG:  Recent Labs Lab 11/06/14 0832 11/06/14 1154 11/06/14 1648 11/06/14 2144 11/07/14 0758  GLUCAP 95 114* 104* 128* 84    Studies/Results: No results found. Medications:   . aspirin EC  81 mg Oral Daily  . benzonatate  100 mg Oral 3 times per day  . calcitRIOL  1 mcg Oral Q T,Th,Sa-HD  . calcium acetate  1,334 mg Oral TID WC  . ceFEPime (MAXIPIME) IV  2 g Intravenous Q T,Th,Sa-HD  . feeding supplement (NEPRO CARB  STEADY)  237 mL Oral BID BM  . [START ON 11/10/2014] ferric gluconate (FERRLECIT/NULECIT) IV  62.5 mg Intravenous Q Thu-HD  . gabapentin  300 mg Oral BID  . heparin  5,000 Units Subcutaneous 3 times per day  . HYDROcodone-homatropine  5 mL Oral Q8H  . insulin aspart  0-5 Units Subcutaneous QHS  . insulin aspart  0-9 Units Subcutaneous TID WC  . insulin detemir  5 Units Subcutaneous QHS  . multivitamin with minerals  1 tablet Oral Daily  . pantoprazole  40 mg Oral Daily  . pravastatin  20 mg Oral QHS  . risperiDONE  0.25 mg Oral QHS  . vancomycin  750 mg Intravenous Q T,Th,Sa-HD

## 2014-11-07 NOTE — Clinical Social Work Placement (Signed)
Katha Hamming, LCSW Social Worker Signed  Progress Notes 11/05/2014 2:53 PM    Expand All Collapse All   Clinical Social Work Department CLINICAL SOCIAL WORK PLACEMENT NOTE 11/05/2014  Patient: James Nicholson, James Nicholson Account Number: [id number] Admit date: 11/03/2014  Clinical Social Worker: Barbette Or, LCSW Date/time: 11/04/2014 04:00 PM  Clinical Social Work is seeking post-discharge placement for this patient at the following level of care: [place] (*CSW will update this form in Epic as items are completed)   11/04/2014 Patient/family provided with [place] Department of Clinical Social Work's list of facilities offering this level of care within the geographic area requested by the patient (or if unable, by the patient's family).  11/04/2014 Patient/family informed of their freedom to choose among providers that offer the needed level of care, that participate in Medicare, Medicaid or managed care program needed by the patient, have an available bed and are willing to accept the patient.  11/04/2014 Patient/family informed of [facility]' ownership interest in [facility], as well as of the fact that they are under no obligation to receive care at this facility.  PASARR submitted to EDS on  PASARR number received on   FL2 transmitted to all facilities in geographic area requested by pt/family on 11/04/2014 FL2 transmitted to all facilities within larger geographic area on   Patient informed that his/her managed care company has contracts with or will negotiate with certain facilities, including the following:    Patient/family informed of bed offers received: 11/05/2014 Patient chooses bed at [facility] Physician recommends and patient chooses bed at   Patient to be transferred to [facility] on 11/07/2014  Patient to be transferred to facility by Ambulance Patient and family notified of transfer on 11/07/2014 Name of family member  notified: Marsa Aris  The following physician request were entered in Epic:   Additional Comments: Per MD patient ready for DC to [facility]. RN, patient, patient's family (patient's brother informed that patient is ready to go to [place]), and facility notified of DC. RN given number for report. DC packet on chart. [facility] transport requested for patient for 4:30pm. CSW signing off.     Liz Beach MSW, [place], [place], [phone]

## 2014-11-07 NOTE — Progress Notes (Signed)
Physical Therapy Treatment Patient Details Name: James Nicholson MRN: [id number] DOB: [birth year] Today's Date: 11/07/2014    History of Present Illness Pt admit with HCAP.    PT Comments    Has made good progress with mobility and gait stability.  Still fatigues rather quickly and is coughing frequently.  Follow Up Recommendations  SNF;Supervision/Assistance - 24 hour     Equipment Recommendations       Recommendations for Other Services       Precautions / Restrictions Precautions Precautions: None Restrictions Weight Bearing Restrictions: No    Mobility  Bed Mobility                  Transfers Overall transfer level: Independent                  Ambulation/Gait Ambulation/Gait assistance: Supervision (closing in on safe independent ambulation) Ambulation Distance (Feet): 550 Feet Assistive device: None Gait Pattern/deviations: Step-through pattern Gait velocity: moderate speed Gait velocity interpretation: at or above normal speed for age/gender General Gait Details: more stable today.  No need for device   Stairs            Wheelchair Mobility    Modified Rankin (Stroke Patients Only)       Balance Overall balance assessment: Needs assistance Sitting-balance support: No upper extremity supported Sitting balance-Leahy Scale: Good     Standing balance support: No upper extremity supported Standing balance-Leahy Scale: Good Standing balance comment: completed standing exercise without holding to sink             High level balance activites: Direction changes;Head turns;Sudden stops;Turns High Level Balance Comments: no overt deviation    Cognition Arousal/Alertness: Awake/alert Behavior During Therapy: WFL for tasks assessed/performed Overall Cognitive Status: History of cognitive impairments - at baseline                      Exercises General Exercises - Lower Extremity Hip ABduction/ADduction:  AROM;Strengthening;Both;10 reps;Standing Hip Flexion/Marching: AROM;Strengthening;Both;10 reps;Standing Toe Raises: AROM;Strengthening;Both;10 reps;Standing Heel Raises: AROM;Strengthening;Both;10 reps;Standing Mini-Sqauts: AROM;Strengthening;10 reps;Standing Other Exercises Other Exercises: modified push up in standing (lean into sink) x 10    General Comments        Pertinent Vitals/Pain Pain Assessment: No/denies pain    Home Living                      Prior Function            PT Goals (current goals can now be found in the care plan section) Acute Rehab PT Goals Patient Stated Goal: unable to assess  PT Goal Formulation: With patient Time For Goal Achievement: 11/11/14 Potential to Achieve Goals: Good Progress towards PT goals: Progressing toward goals    Frequency  Min 3X/week    PT Plan Current plan remains appropriate    Co-evaluation             End of Session   Activity Tolerance: Patient tolerated treatment well Patient left: in chair;with call bell/phone within reach     Time: [id number] PT Time Calculation (min) (ACUTE ONLY): 17 min  Charges:  $Gait Training: 8-22 mins                    G Codes:      Alasia Enge, Tessie Fass 11/07/2014, 11:55 AM 11/07/2014  Donnella Sham, PT [phone]  (pager)

## 2014-11-07 NOTE — Progress Notes (Signed)
OT Cancellation Note  Patient Details Name: James Nicholson MRN: [id number] DOB: 1966/10/22   Cancelled Treatment:    Reason Eval/Treat Not Completed: OT screened.  Pt is Medicare and current D/C plan is SNF (spoke with Education officer, museum). No apparent immediate acute care OT needs, therefore will defer OT to SNF. If OT eval is needed please call Acute Rehab Dept. at [phone] or text page OT at [phone].    [name] OTR/L [id number] 11/07/2014, 10:56 AM

## 2014-11-07 NOTE — Discharge Summary (Addendum)
Discharge Summary  James Nicholson MR#: [id number]  DOB:October 06, 1966  Date of Admission: 11/03/2014 Date of Discharge: 11/07/2014  Patient's PCP: [name], MD  Attending Physician:Kaysie Michelini A  Consults: Treatment Team:  Windy Kalata, MD, Renal  Discharge Diagnoses: Principal Problem:   HCAP (healthcare-associated pneumonia) Active Problems:   Wegener's disease, pulmonary   End stage renal disease   Sepsis   IDDM (insulin dependent diabetes mellitus)  Brief Admitting History and Physical On admission: "James Nicholson is a 49 y.o. male ESRD dialysis TTS is brought in to ED by brother after dialysis today. Patient's brother noted that he was having cough, SOB. Cough is productive. Patient unable to qualify how long its been going on for but suspect the last couple of days. [place]."  Discharge Medications   Medication List    TAKE these medications        acetaminophen 325 MG tablet  Commonly known as:  TYLENOL  Take 2 tablets (650 mg total) by mouth every 6 (six) hours as needed for fever.     aspirin 81 MG EC tablet  Take 1 tablet (81 mg total) by mouth daily.     benzonatate 100 MG capsule  Commonly known as:  TESSALON  Take 1 capsule (100 mg total) by mouth 3 (three) times daily as needed for cough.     calcitRIOL 0.5 MCG capsule  Commonly known as:  ROCALTROL  Take 2 capsules (1 mcg total) by mouth Every Tuesday,Thursday,and Saturday with dialysis.     calcium acetate 667 MG capsule  Commonly known as:  PHOSLO  Take 1,334 mg by mouth 3 (three) times daily with meals. 8a, 12p, 5p     calcium carbonate 500 MG chewable tablet  Commonly known as:  TUMS - dosed in mg elemental calcium  Chew 1 tablet (200 mg of elemental calcium total) by mouth 2 (two) times daily as needed for indigestion or heartburn.     feeding supplement (NEPRO CARB STEADY) Liqd  Take 237 mLs by mouth 2 (two) times daily between meals.      feeding supplement (RESOURCE BREEZE) Liqd  Take 1 Container by mouth 3 (three) times daily between meals.     ferric gluconate 62.5 mg in sodium chloride 0.9 % 100 mL  Inject 62.5 mg into the vein every Thursday with hemodialysis.  Start taking on:  11/10/2014     gabapentin 300 MG capsule  Commonly known as:  NEURONTIN  Take 300 mg by mouth 2 (two) times daily.     insulin detemir 100 UNIT/ML injection  Commonly known as:  LEVEMIR  Inject 0.05 mLs (5 Units total) into the skin at bedtime.     levofloxacin 500 MG tablet  Commonly known as:  LEVAQUIN  Take 1 tablet (500 mg total) by mouth every other day. Discontinue after 11/09/2014.     menthol-cetylpyridinium 3 MG lozenge  Commonly known as:  CEPACOL  Take 1 lozenge (3 mg total) by mouth as needed for sore throat.     multivitamin with minerals Tabs tablet  Take 1 tablet by mouth every morning.     omeprazole 20 MG capsule  Commonly known as:  PRILOSEC  Take 20 mg by mouth every morning.     pravastatin 20 MG tablet  Commonly known as:  PRAVACHOL  Take 20 mg by mouth at bedtime.     risperiDONE 0.25 MG tablet  Commonly known as:  RISPERDAL  Take 0.25 mg by mouth at  bedtime.        Hospital Course: HCAP (healthcare-associated pneumonia) Present on Admission:  . Sepsis . HCAP (healthcare-associated pneumonia) . End stage renal disease . Wegener's disease, pulmonary   HCAP (healthcare-associated pneumonia), SIRS - Initially on IV vancomycin and cefepime, which was transitioned to levofloxacin at the time of discharge.  Patient to take last dose of levofloxacin on 11/09/2014 to complete a day course of antibiotics. -Symptomatically treatment for cough with Tessalon, Cepacol. - Continue flutter valve, O2 - Per patient, coughing is improving after started on antitussives   End stage renal disease - Hemodialysis TTS, renal consulted, patient undergoing hemodialysis  IDDM (insulin dependent diabetes  mellitus) -Continue Levemir. CBGs controlled.  Hyperlipidemia  -Continue statin  Consultants: Nephrology   Procedures: Hemodialysis   Antibiotics:  IV vancomycin 1/7 - 1/11  IV cefepime 1/7 - 1/11  Levofloxacin 1/11 - (till 1/13)  Physical Exam: General: Awake, Oriented, No acute distress. HEENT: EOMI. Neck: Supple CV: S1 and S2 Lungs: Clear to ascultation bilaterally Abdomen: Soft, Nontender, Nondistended, +bowel sounds. Ext: Good pulses. Trace edema.  Day of Discharge BP 117/66 mmHg  Pulse 70  Temp(Src) 98.2 F (36.8 C) (Oral)  Resp 16  Ht _0  (1.753 m)  Wt 75.6 kg (166 lb 10.7 oz)  BMI 24.60 kg/m2  SpO2 93%  Results for orders placed or performed during the hospital encounter of 11/03/14 (from the past 48 hour(s))  Glucose, capillary     Status: Abnormal   Collection Time: 11/05/14  6:01 PM  Result Value Ref Range   Glucose-Capillary 106 (H) 70 - 99 mg/dL   Comment 1 Notify RN    Comment 2 Documented in Chart   Glucose, capillary     Status: Abnormal   Collection Time: 11/05/14  9:02 PM  Result Value Ref Range   Glucose-Capillary 108 (H) 70 - 99 mg/dL   Comment 1 Notify RN   CBC     Status: Abnormal   Collection Time: 11/06/14  5:25 AM  Result Value Ref Range   WBC 7.6 4.0 - 10.5 K/uL   RBC 3.74 (L) 4.22 - 5.81 MIL/uL   Hemoglobin 11.2 (L) 13.0 - 17.0 g/dL   HCT 34.1 (L) 39.0 - 52.0 %   MCV 91.2 78.0 - 100.0 fL   MCH 29.9 26.0 - 34.0 pg   MCHC 32.8 30.0 - 36.0 g/dL   RDW 12.9 11.5 - 15.5 %   Platelets 185 150 - 400 K/uL  Basic metabolic panel     Status: Abnormal   Collection Time: 11/06/14  5:25 AM  Result Value Ref Range   Sodium 137 135 - 145 mmol/L    Comment: Please note change in reference range.   Potassium 3.7 3.5 - 5.1 mmol/L    Comment: Please note change in reference range.   Chloride 98 96 - 112 mEq/L   CO2 29 19 - 32 mmol/L   Glucose, Bld 89 70 - 99 mg/dL   BUN 38 (H) 6 - 23 mg/dL   Creatinine, Ser 5.71 (H) 0.50 - 1.35 mg/dL    Calcium 8.7 8.4 - 10.5 mg/dL   GFR calc non Af Amer 11 (L) >90 mL/min   GFR calc Af Amer 12 (L) >90 mL/min    Comment: (NOTE) The eGFR has been calculated using the CKD EPI equation. This calculation has not been validated in all clinical situations. eGFR's persistently <90 mL/min signify possible Chronic Kidney Disease.    Anion gap 10 5 - 15  Glucose, capillary     Status: None   Collection Time: 11/06/14  8:32 AM  Result Value Ref Range   Glucose-Capillary 95 70 - 99 mg/dL  Glucose, capillary     Status: Abnormal   Collection Time: 11/06/14 11:54 AM  Result Value Ref Range   Glucose-Capillary 114 (H) 70 - 99 mg/dL  Glucose, capillary     Status: Abnormal   Collection Time: 11/06/14  4:48 PM  Result Value Ref Range   Glucose-Capillary 104 (H) 70 - 99 mg/dL  Glucose, capillary     Status: Abnormal   Collection Time: 11/06/14  9:44 PM  Result Value Ref Range   Glucose-Capillary 128 (H) 70 - 99 mg/dL   Comment 1 Documented in Chart    Comment 2 Notify RN   CBC     Status: Abnormal   Collection Time: 11/07/14  5:07 AM  Result Value Ref Range   WBC 6.2 4.0 - 10.5 K/uL   RBC 3.64 (L) 4.22 - 5.81 MIL/uL   Hemoglobin 10.9 (L) 13.0 - 17.0 g/dL   HCT 33.1 (L) 39.0 - 52.0 %   MCV 90.9 78.0 - 100.0 fL   MCH 29.9 26.0 - 34.0 pg   MCHC 32.9 30.0 - 36.0 g/dL   RDW 12.7 11.5 - 15.5 %   Platelets 207 150 - 400 K/uL  Basic metabolic panel     Status: Abnormal   Collection Time: 11/07/14  5:07 AM  Result Value Ref Range   Sodium 134 (L) 135 - 145 mmol/L    Comment: Please note change in reference range.   Potassium 3.9 3.5 - 5.1 mmol/L    Comment: Please note change in reference range.   Chloride 98 96 - 112 mEq/L   CO2 27 19 - 32 mmol/L   Glucose, Bld 93 70 - 99 mg/dL   BUN 61 (H) 6 - 23 mg/dL    Comment: DELTA CHECK NOTED   Creatinine, Ser 7.02 (H) 0.50 - 1.35 mg/dL   Calcium 8.6 8.4 - 10.5 mg/dL   GFR calc non Af Amer 8 (L) >90 mL/min   GFR calc Af Amer 10 (L) >90 mL/min     Comment: (NOTE) The eGFR has been calculated using the CKD EPI equation. This calculation has not been validated in all clinical situations. eGFR's persistently <90 mL/min signify possible Chronic Kidney Disease.    Anion gap 9 5 - 15  Glucose, capillary     Status: None   Collection Time: 11/07/14  7:58 AM  Result Value Ref Range   Glucose-Capillary 84 70 - 99 mg/dL  Glucose, capillary     Status: Abnormal   Collection Time: 11/07/14 12:15 PM  Result Value Ref Range   Glucose-Capillary 117 (H) 70 - 99 mg/dL    Dg Chest Port 1 View  11/03/2014   CLINICAL DATA:  Chest pain. Onset of symptoms yesterday. Dialysis patient.  EXAM: PORTABLE CHEST - 1 VIEW  COMPARISON:  Chest radiograph 12/21/2013.  FINDINGS: Unchanged LEFT subclavian AICD. Low lung volumes are present. There is subsegmental atelectasis at the RIGHT lung base which is discoid in configuration. No focal airspace consolidation identified. Monitoring leads project over the chest. The cardiopericardial silhouette is within normal limits. Mediastinal contours are normal.  IMPRESSION: No acute cardiopulmonary disease. RIGHT basilar discoid atelectasis.   Electronically Signed   By: Dereck Ligas M.D.   On: 11/03/2014 19:36     Disposition: Skilled nursing facility  Diet: Diet renal/carb modified with 1200  ml fluid restriction  Activity: Resume as tolerated.   Follow-up Appts:     Discharge Instructions    Discharge diet:    Complete by:  As directed   Diet renal/carb modified with 1200 ml fluid restriction  Diet renal/carb modified with 1200 ml fluid restriction     Increase activity slowly    Complete by:  As directed            TESTS THAT NEED FOLLOW-UP None  Time spent on discharge, talking to the patient, and coordinating care: 40 mins.   Signed: Bynum Bellows, MD 11/07/2014, 2:49 PM

## 2014-11-08 LAB — LEGIONELLA ANTIGEN, URINE

## 2014-11-10 ENCOUNTER — Non-Acute Institutional Stay (SKILLED_NURSING_FACILITY): Payer: Medicare Other | Admitting: Internal Medicine

## 2014-11-10 ENCOUNTER — Encounter: Payer: Self-pay | Admitting: Internal Medicine

## 2014-11-10 DIAGNOSIS — D631 Anemia in chronic kidney disease: Secondary | ICD-10-CM | POA: Diagnosis not present

## 2014-11-10 DIAGNOSIS — Z95810 Presence of automatic (implantable) cardiac defibrillator: Secondary | ICD-10-CM | POA: Diagnosis not present

## 2014-11-10 DIAGNOSIS — J189 Pneumonia, unspecified organism: Secondary | ICD-10-CM | POA: Diagnosis not present

## 2014-11-10 DIAGNOSIS — N186 End stage renal disease: Secondary | ICD-10-CM | POA: Diagnosis not present

## 2014-11-10 DIAGNOSIS — E785 Hyperlipidemia, unspecified: Secondary | ICD-10-CM | POA: Diagnosis not present

## 2014-11-10 DIAGNOSIS — N189 Chronic kidney disease, unspecified: Secondary | ICD-10-CM | POA: Diagnosis not present

## 2014-11-10 DIAGNOSIS — IMO0001 Reserved for inherently not codable concepts without codable children: Secondary | ICD-10-CM

## 2014-11-10 DIAGNOSIS — Z794 Long term (current) use of insulin: Secondary | ICD-10-CM

## 2014-11-10 DIAGNOSIS — E119 Type 2 diabetes mellitus without complications: Secondary | ICD-10-CM | POA: Diagnosis not present

## 2014-11-10 LAB — CULTURE, BLOOD (ROUTINE X 2)
Culture: NO GROWTH
Culture: NO GROWTH

## 2014-11-10 NOTE — Progress Notes (Signed)
MRN: [id number] Name: James Nicholson  Sex: male Age: 49 y.o. DOB: [birth year]  [place] #: Helene Kelp Facility/Room: 212 Level Of Care: SNF Provider: Inocencio Homes D Emergency Contacts: Extended Emergency Contact Information Primary Emergency Contact: [facility] Address: [address]          [place], [place] [address] Johnnette Litter of [place] Phone: [phone] Mobile Phone: [phone] Relation: Brother   Allergies: Review of patient's allergies indicates no known allergies.  Chief Complaint  Patient presents with  . New Admit To SNF    HPI: Patient is 49 y.o. male who has ESRD, admitted to hospital for PNA, now admitted to SNF for generalized weakness for OT/PT.  Past Medical History  Diagnosis Date  . Hypertension   . Dyslipidemia   . Anemia   . Cellulitis   . Atrial fibrillation   . Enterobacter sepsis   . Schizophrenia   . Chronic kidney disease   . Cardiomyopathy 2010    Unclear Etiology: Last Echo 06/2009: EF 40-45%, severe Lateral & apical Hypokinesis (? CAD) ; Grade 2 DDysfxn. Mild conc LVH.   . S/P ICD (internal cardiac defibrillator) procedure 2010    VT Arrest (in [place])  . Hyperlipidemia     Past Surgical History  Procedure Laterality Date  . Cardiac defibrillator placement  2010    [place]  . Av fistula placement Right 02/10/2013    Procedure: ARTERIOVENOUS (AV) FISTULA CREATION;  Surgeon: Rosetta Posner, MD;  Location[facility] OR;  Service: Vascular;  Laterality: Right;  Right forearm radial/cephalic arterovenous fistula.   . Ligation of competing branches of arteriovenous fistula Right 08/13/2013    Procedure: LIGATION OF COMPETING BRANCHES X5 OF ARTERIOVENOUS FISTULA- RIGHT ARM;  Surgeon: Serafina Mitchell, MD;  Location: [place];  Service: Vascular;  Laterality: Right;  . Cardiac catheterization  2010    [place]: In setting of VT arrest. Per brother's report, nonobstructive with no intervention  . Fistulogram Right 08/09/2013    Procedure:  FISTULOGRAM;  Surgeon: Angelia Mould, MD;  Location[facility] CATH LAB;  Service: Cardiovascular;  Laterality: Right;      Medication List       This list is accurate as of: 11/10/14 11:59 PM.  Always use your most recent med list.               acetaminophen 325 MG tablet  Commonly known as:  TYLENOL  Take 2 tablets (650 mg total) by mouth every 6 (six) hours as needed for fever.     aspirin 81 MG EC tablet  Take 1 tablet (81 mg total) by mouth daily.     benzonatate 100 MG capsule  Commonly known as:  TESSALON  Take 1 capsule (100 mg total) by mouth 3 (three) times daily as needed for cough.     calcitRIOL 0.5 MCG capsule  Commonly known as:  ROCALTROL  Take 2 capsules (1 mcg total) by mouth Every Tuesday,Thursday,and Saturday with dialysis.     calcium acetate 667 MG capsule  Commonly known as:  PHOSLO  Take 1,334 mg by mouth 3 (three) times daily with meals. 8a, 12p, 5p     calcium carbonate 500 MG chewable tablet  Commonly known as:  TUMS - dosed in mg elemental calcium  Chew 1 tablet (200 mg of elemental calcium total) by mouth 2 (two) times daily as needed for indigestion or heartburn.     feeding supplement (NEPRO CARB STEADY) Liqd  Take 237 mLs by mouth 2 (two) times  daily between meals.     feeding supplement (RESOURCE BREEZE) Liqd  Take 1 Container by mouth 3 (three) times daily between meals.     ferric gluconate 62.5 mg in sodium chloride 0.9 % 100 mL  Inject 62.5 mg into the vein every Thursday with hemodialysis.     gabapentin 300 MG capsule  Commonly known as:  NEURONTIN  Take 300 mg by mouth 2 (two) times daily.     insulin detemir 100 UNIT/ML injection  Commonly known as:  LEVEMIR  Inject 0.05 mLs (5 Units total) into the skin at bedtime.     menthol-cetylpyridinium 3 MG lozenge  Commonly known as:  CEPACOL  Take 1 lozenge (3 mg total) by mouth as needed for sore throat.     multivitamin with minerals Tabs tablet  Take 1 tablet by mouth  every morning.     omeprazole 20 MG capsule  Commonly known as:  PRILOSEC  Take 20 mg by mouth every morning.     pravastatin 20 MG tablet  Commonly known as:  PRAVACHOL  Take 20 mg by mouth at bedtime.     risperiDONE 0.25 MG tablet  Commonly known as:  RISPERDAL  Take 0.25 mg by mouth at bedtime.        No orders of the defined types were placed in this encounter.    Immunization History  Administered Date(s) Administered  . Influenza Split 07/28/2012  . Influenza,inj,Quad PF,36+ Mos 12/22/2013    History  Substance Use Topics  . Smoking status: Former Smoker    Quit date: 10/28/2008  . Smokeless tobacco: Never Used  . Alcohol Use: No    Family history is noncontributory    Review of Systems  DATA OBTAINED: from patient, nurse, medical record GENERAL:  no fevers, fatigue, appetite changes SKIN: No itching, rash or wounds EYES: No eye pain, redness, discharge EARS: No earache, tinnitus, change in hearing NOSE: No congestion, drainage or bleeding  MOUTH/THROAT: No mouth or tooth pain, No sore throat RESPIRATORY: No cough, wheezing, SOB CARDIAC: No chest pain, palpitations, lower extremity edema  GI: No abdominal pain, No N/V/D or constipation, No heartburn or reflux  GU: No dysuria, frequency or urgency, or incontinence  MUSCULOSKELETAL: No unrelieved bone/joint pain NEUROLOGIC: No headache, dizziness or focal weakness PSYCHIATRIC: No overt anxiety or sadness, No behavior issue.   Filed Vitals:   11/10/14 2203  BP: 117/74  Pulse: 89  Temp: 97.4 F (36.3 C)  Resp: 20    Physical Exam  GENERAL APPEARANCE: Alert, conversant,  No acute distress.  SKIN: No diaphoresis rash HEAD: Normocephalic, atraumatic  EYES: Conjunctiva/lids clear. Pupils round, reactive. EOMs intact.  EARS: External exam WNL, canals clear. Hearing grossly normal.  NOSE: No deformity or discharge.  MOUTH/THROAT: Lips w/o lesions  RESPIRATORY: Breathing is even, unlabored. Lung  sounds are clear   CARDIOVASCULAR: Heart RRR no murmurs, rubs or gallops. trace peripheral edema.   GASTROINTESTINAL: Abdomen is soft, non-tender, not distended w/ normal bowel sounds. GENITOURINARY: Bladder non tender, not distended  MUSCULOSKELETAL: No abnormal joints or musculature NEUROLOGIC:  Cranial nerves 2-12 grossly intact. PSYCHIATRIC: Mood and affect appropriate to situation, no behavioral issues  Patient Active Problem List   Diagnosis Date Noted  . Hyperlipidemia   . Sepsis 11/03/2014  . IDDM (insulin dependent diabetes mellitus) 11/03/2014  . Troponin level elevated 12/22/2013    Class: Acute  . HCAP (healthcare-associated pneumonia) 12/21/2013  . Mechanical complication of other vascular device, implant, and graft 08/04/2013  .  Other complications due to renal dialysis device, implant, and graft 08/04/2013  . End stage renal disease 04/13/2013  . ARF (acute renal failure) 02/05/2013  . Anemia in chronic kidney disease 02/05/2013  . Pneumonia 02/05/2013  . GI bleed 02/05/2013  . Metabolic acidosis [id number]  . Wegener's disease, pulmonary 02/05/2013  . PPD positive - completed 9 months INH (per Dr. Linus Salmons) 05/25/2012  . Other primary cardiomyopathies 05/07/2012  . Ventricular fibrillation 05/07/2012    Class: History of  . Hypertension 05/07/2012  . Automatic implantable cardioverter-defibrillator in situ 05/07/2012    CBC    Component Value Date/Time   WBC 6.2 11/07/2014 0507   RBC 3.64* 11/07/2014 0507   HGB 10.9* 11/07/2014 0507   HCT 33.1* 11/07/2014 0507   PLT 207 11/07/2014 0507   MCV 90.9 11/07/2014 0507   LYMPHSABS 1.7 03/12/2013 1248   MONOABS 0.7 03/12/2013 1248   EOSABS 0.0 03/12/2013 1248   BASOSABS 0.0 03/12/2013 1248    CMP     Component Value Date/Time   NA 134* 11/07/2014 0507   K 3.9 11/07/2014 0507   CL 98 11/07/2014 0507   CO2 27 11/07/2014 0507   GLUCOSE 93 11/07/2014 0507   BUN 61* 11/07/2014 0507   CREATININE 7.02*  11/07/2014 0507   CREATININE 1.15 05/22/2012 1052   CALCIUM 8.6 11/07/2014 0507   CALCIUM 8.3* 02/05/2013 1900   PROT 8.3 11/03/2014 1827   ALBUMIN 2.9* 11/05/2014 0759   AST 47* 11/03/2014 1827   ALT 38 11/03/2014 1827   ALKPHOS 53 11/03/2014 1827   BILITOT 0.8 11/03/2014 1827   GFRNONAA 8* 11/07/2014 0507   GFRNONAA 76 05/22/2012 1052   GFRAA 10* 11/07/2014 0507   GFRAA 88 05/22/2012 1052    Assessment and Plan  HCAP (healthcare-associated pneumonia) Initially on IV vancomycin and cefepime, which was transitioned to levofloxacin at the time of discharge. Patient to take last dose of levofloxacin on 11/09/2014 to complete a day course of antibiotics. -Symptomatically treatment for cough with Tessalon, Cepacol. - Continue flutter valve, O2 - Per patient, coughing is improving after started on antitussives   End stage renal disease Hemodialysis TTS, renal consulted, patient undergoing hemodialysis    IDDM (insulin dependent diabetes mellitus) Continue Levemir. CBGs controlled.    Hyperlipidemia Pravachol 20 mg daily   Automatic implantable cardioverter-defibrillator in situ Noted   Anemia in chronic kidney disease Continue iron     Hennie Duos, MD

## 2014-11-12 ENCOUNTER — Encounter: Payer: Self-pay | Admitting: Internal Medicine

## 2014-11-12 DIAGNOSIS — E785 Hyperlipidemia, unspecified: Secondary | ICD-10-CM | POA: Insufficient documentation

## 2014-11-12 NOTE — Assessment & Plan Note (Signed)
Pravachol 20 mg daily

## 2014-11-12 NOTE — Assessment & Plan Note (Signed)
Continue Levemir. CBGs controlled.

## 2014-11-12 NOTE — Assessment & Plan Note (Signed)
Initially on IV vancomycin and cefepime, which was transitioned to levofloxacin at the time of discharge. Patient to take last dose of levofloxacin on 11/09/2014 to complete a day course of antibiotics. -Symptomatically treatment for cough with Tessalon, Cepacol. - Continue flutter valve, O2 - Per patient, coughing is improving after started on antitussives

## 2014-11-12 NOTE — Assessment & Plan Note (Signed)
Noted  

## 2014-11-12 NOTE — Assessment & Plan Note (Signed)
Continue iron  

## 2014-11-12 NOTE — Assessment & Plan Note (Signed)
Hemodialysis TTS, renal consulted, patient undergoing hemodialysis

## 2014-11-14 ENCOUNTER — Encounter: Payer: Medicare Other | Admitting: Internal Medicine

## 2014-11-16 ENCOUNTER — Encounter: Payer: Self-pay | Admitting: Internal Medicine

## 2014-11-27 DIAGNOSIS — Z992 Dependence on renal dialysis: Secondary | ICD-10-CM | POA: Diagnosis not present

## 2014-11-27 DIAGNOSIS — N186 End stage renal disease: Secondary | ICD-10-CM | POA: Diagnosis not present

## 2014-11-29 DIAGNOSIS — N186 End stage renal disease: Secondary | ICD-10-CM | POA: Diagnosis not present

## 2014-11-29 DIAGNOSIS — Z23 Encounter for immunization: Secondary | ICD-10-CM | POA: Diagnosis not present

## 2014-11-29 DIAGNOSIS — E119 Type 2 diabetes mellitus without complications: Secondary | ICD-10-CM | POA: Diagnosis not present

## 2014-11-29 DIAGNOSIS — N2581 Secondary hyperparathyroidism of renal origin: Secondary | ICD-10-CM | POA: Diagnosis not present

## 2014-11-29 DIAGNOSIS — D509 Iron deficiency anemia, unspecified: Secondary | ICD-10-CM | POA: Diagnosis not present

## 2014-12-19 DIAGNOSIS — F250 Schizoaffective disorder, bipolar type: Secondary | ICD-10-CM | POA: Diagnosis not present

## 2014-12-26 DIAGNOSIS — N186 End stage renal disease: Secondary | ICD-10-CM | POA: Diagnosis not present

## 2014-12-26 DIAGNOSIS — Z992 Dependence on renal dialysis: Secondary | ICD-10-CM | POA: Diagnosis not present

## 2014-12-27 DIAGNOSIS — N2581 Secondary hyperparathyroidism of renal origin: Secondary | ICD-10-CM | POA: Diagnosis not present

## 2014-12-27 DIAGNOSIS — N186 End stage renal disease: Secondary | ICD-10-CM | POA: Diagnosis not present

## 2014-12-27 DIAGNOSIS — E119 Type 2 diabetes mellitus without complications: Secondary | ICD-10-CM | POA: Diagnosis not present

## 2014-12-27 DIAGNOSIS — D509 Iron deficiency anemia, unspecified: Secondary | ICD-10-CM | POA: Diagnosis not present

## 2014-12-30 DIAGNOSIS — E785 Hyperlipidemia, unspecified: Secondary | ICD-10-CM | POA: Diagnosis not present

## 2014-12-30 DIAGNOSIS — E1122 Type 2 diabetes mellitus with diabetic chronic kidney disease: Secondary | ICD-10-CM | POA: Diagnosis not present

## 2014-12-30 DIAGNOSIS — K219 Gastro-esophageal reflux disease without esophagitis: Secondary | ICD-10-CM | POA: Diagnosis not present

## 2014-12-30 DIAGNOSIS — T7849XA Other allergy, initial encounter: Secondary | ICD-10-CM | POA: Diagnosis not present

## 2014-12-30 DIAGNOSIS — J189 Pneumonia, unspecified organism: Secondary | ICD-10-CM | POA: Diagnosis not present

## 2014-12-30 DIAGNOSIS — F209 Schizophrenia, unspecified: Secondary | ICD-10-CM | POA: Diagnosis not present

## 2014-12-30 DIAGNOSIS — E1140 Type 2 diabetes mellitus with diabetic neuropathy, unspecified: Secondary | ICD-10-CM | POA: Diagnosis not present

## 2015-01-11 ENCOUNTER — Encounter: Payer: Self-pay | Admitting: Internal Medicine

## 2015-01-11 ENCOUNTER — Ambulatory Visit (INDEPENDENT_AMBULATORY_CARE_PROVIDER_SITE_OTHER): Payer: Medicare Other | Admitting: Internal Medicine

## 2015-01-11 VITALS — BP 114/69 | HR 107 | Ht 68.0 in | Wt 180.6 lb

## 2015-01-11 DIAGNOSIS — Z95810 Presence of automatic (implantable) cardiac defibrillator: Secondary | ICD-10-CM

## 2015-01-11 DIAGNOSIS — I4901 Ventricular fibrillation: Secondary | ICD-10-CM

## 2015-01-11 DIAGNOSIS — Z4502 Encounter for adjustment and management of automatic implantable cardiac defibrillator: Secondary | ICD-10-CM | POA: Diagnosis not present

## 2015-01-11 DIAGNOSIS — I10 Essential (primary) hypertension: Secondary | ICD-10-CM

## 2015-01-11 LAB — MDC_IDC_ENUM_SESS_TYPE_INCLINIC
Battery Voltage: 3.02 V
Brady Statistic RV Percent Paced: 0 %
Date Time Interrogation Session: 20160316100753
HighPow Impedance: 49 Ohm
HighPow Impedance: 67 Ohm
Lead Channel Impedance Value: 551 Ohm
Lead Channel Pacing Threshold Amplitude: 0.5 V
Lead Channel Pacing Threshold Pulse Width: 0.5 ms
Lead Channel Sensing Intrinsic Amplitude: 3.5 mV
Lead Channel Setting Pacing Amplitude: 2.5 V
Lead Channel Setting Pacing Pulse Width: 0.5 ms
Lead Channel Setting Sensing Sensitivity: 0.3 mV
Zone Setting Detection Interval: 270 ms
Zone Setting Detection Interval: 330 ms
Zone Setting Detection Interval: 390 ms
Zone Setting Detection Interval: 450 ms

## 2015-01-11 MED ORDER — METOPROLOL TARTRATE 25 MG PO TABS: 25.0000 mg | ORAL_TABLET | Freq: Two times a day (BID) | ORAL | Status: DC

## 2015-01-11 NOTE — Assessment & Plan Note (Signed)
His Medtronic ICD is working normally. Will recheck in several months. 

## 2015-01-11 NOTE — Progress Notes (Signed)
HPI Mr. Ziel returns today for for ongoing evaluation and management of his ICD. The patient is a very pleasant 49 year old man with multiple medical problems. He had a resuscitated ventricular fibrillation arrest several years ago and underwent ICD implantation. He has glomerulonephritis which has progressed to ESRD and HD. He has schizophrenia. He denies any recent ICD shocks. He has class II heart failure symptoms. He admits to a sedentary lifestyle. No Known Allergies   Current Outpatient Prescriptions  Medication Sig Dispense Refill  . calcitRIOL (ROCALTROL) 0.5 MCG capsule Take 2 capsules (1 mcg total) by mouth Every Tuesday,Thursday,and Saturday with dialysis.    Marland Kitchen calcium acetate (PHOSLO) 667 MG capsule Take 1,334 mg by mouth 3 (three) times daily with meals. 8a, 12p, 5p    . gabapentin (NEURONTIN) 300 MG capsule Take 300 mg by mouth 2 (two) times daily.    . insulin detemir (LEVEMIR) 100 UNIT/ML injection Inject 0.05 mLs (5 Units total) into the skin at bedtime. 10 mL 0  . Multiple Vitamin (MULTIVITAMIN WITH MINERALS) TABS tablet Take 1 tablet by mouth every morning.     Marland Kitchen omeprazole (PRILOSEC) 20 MG capsule Take 20 mg by mouth every morning.     . pravastatin (PRAVACHOL) 20 MG tablet Take 20 mg by mouth at bedtime.    . risperiDONE (RISPERDAL) 0.25 MG tablet Take 0.25 mg by mouth at bedtime.     No current facility-administered medications for this visit.     Past Medical History  Diagnosis Date  . Hypertension   . Dyslipidemia   . Anemia   . Cellulitis   . Atrial fibrillation   . Enterobacter sepsis   . Schizophrenia   . Chronic kidney disease   . Cardiomyopathy 2010    Unclear Etiology: Last Echo 06/2009: EF 40-45%, severe Lateral & apical Hypokinesis (? CAD) ; Grade 2 DDysfxn. Mild conc LVH.   . S/P ICD (internal cardiac defibrillator) procedure 2010    VT Arrest (in [place])  . Hyperlipidemia     ROS:   All systems reviewed and negative except as  noted in the HPI.   Past Surgical History  Procedure Laterality Date  . Cardiac defibrillator placement  2010    [place]  . Av fistula placement Right 02/10/2013    Procedure: ARTERIOVENOUS (AV) FISTULA CREATION;  Surgeon: Rosetta Posner, MD;  [facility] OR;  Service: Vascular;  Laterality: Right;  Right forearm radial/cephalic arterovenous fistula.   . Ligation of competing branches of arteriovenous fistula Right 08/13/2013    Procedure: LIGATION OF COMPETING BRANCHES X5 OF ARTERIOVENOUS FISTULA- RIGHT ARM;  Surgeon: Serafina Mitchell, MD;  Location: [place];  Service: Vascular;  Laterality: Right;  . Cardiac catheterization  2010    [place]: In setting of VT arrest. Per brother's report, nonobstructive with no intervention  . Fistulogram Right 08/09/2013    Procedure: FISTULOGRAM;  Surgeon: Angelia Mould, MD;  Location: [facility] CATH LAB;  Service: Cardiovascular;  Laterality: Right;     Family History  Problem Relation Age of Onset  . CAD Mother      History   Social History  . Marital Status: Single    Spouse Name: N/A  . Number of Children: N/A  . Years of Education: N/A   Occupational History  . Not on file.   Social History Main Topics  . Smoking status: Former Smoker    Quit date: 10/28/2008  . Smokeless tobacco: Never Used  . Alcohol Use: No  .  Drug Use: No  . Sexual Activity: Not on file   Other Topics Concern  . Not on file   Social History Narrative     BP 114/69 mmHg  Pulse 107  Ht 5\' 8"  (1.727 m)  Wt 180 lb 9.6 oz (81.92 kg)  BMI 27.47 kg/m2  Physical Exam:  Well appearing 49 yo man, NAD HEENT: Unremarkable Neck:  No JVD, no thyromegally Back:  No CVA tenderness Lungs:  Clear with no wheezes HEART:  Regular rate rhythm, no murmurs, no rubs, no clicks Abd:  soft, positive bowel sounds, no organomegally, no rebound, no guarding Ext:  2 plus pulses, no edema, no cyanosis, no clubbing, right arm with fistula Skin:  No rashes no nodules Neuro:   CN II through XII intact, motor grossly intact   DEVICE  Normal device function.  See PaceArt for details.   Assess/Plan:

## 2015-01-11 NOTE — Assessment & Plan Note (Signed)
He has had no recurrent ventricular arrhythmias. He has had NSVT which is asymptomatic.

## 2015-01-11 NOTE — Assessment & Plan Note (Signed)
His blood pressure is well controlled. I have started him on low dose metoprolol in hopes of reducing his risk for worsening heart failure and recurrent VT/VF.

## 2015-01-11 NOTE — Patient Instructions (Addendum)
Your physician has recommended you make the following change in your medication:  1) START Metoprolol 25 mg twice daily  Remote monitoring is used to monitor your Pacemaker of ICD from home. This monitoring reduces the number of office visits required to check your device to one time per year. It allows [place] to keep an eye on the functioning of your device to ensure it is working properly. You are scheduled for a device check from home on 04/12/15. You may send your transmission at any time that day. If you have a wireless device, the transmission will be sent automatically. After your physician reviews your transmission, you will receive a postcard with your next transmission date.  Your physician wants you to follow-up in: 1 year with Dr. Lovena Le.  You will receive a reminder letter in the mail two months in advance. If you don't receive a letter, please call our office to schedule the follow-up appointment.

## 2015-01-26 DIAGNOSIS — Z992 Dependence on renal dialysis: Secondary | ICD-10-CM | POA: Diagnosis not present

## 2015-01-26 DIAGNOSIS — N032 Chronic nephritic syndrome with diffuse membranous glomerulonephritis: Secondary | ICD-10-CM | POA: Diagnosis not present

## 2015-01-26 DIAGNOSIS — N186 End stage renal disease: Secondary | ICD-10-CM | POA: Diagnosis not present

## 2015-01-28 DIAGNOSIS — N186 End stage renal disease: Secondary | ICD-10-CM | POA: Diagnosis not present

## 2015-01-28 DIAGNOSIS — K759 Inflammatory liver disease, unspecified: Secondary | ICD-10-CM | POA: Diagnosis not present

## 2015-01-28 DIAGNOSIS — N2581 Secondary hyperparathyroidism of renal origin: Secondary | ICD-10-CM | POA: Diagnosis not present

## 2015-01-28 DIAGNOSIS — E119 Type 2 diabetes mellitus without complications: Secondary | ICD-10-CM | POA: Diagnosis not present

## 2015-01-28 DIAGNOSIS — D509 Iron deficiency anemia, unspecified: Secondary | ICD-10-CM | POA: Diagnosis not present

## 2015-02-09 DIAGNOSIS — E1129 Type 2 diabetes mellitus with other diabetic kidney complication: Secondary | ICD-10-CM | POA: Diagnosis not present

## 2015-02-25 DIAGNOSIS — Z992 Dependence on renal dialysis: Secondary | ICD-10-CM | POA: Diagnosis not present

## 2015-02-25 DIAGNOSIS — N032 Chronic nephritic syndrome with diffuse membranous glomerulonephritis: Secondary | ICD-10-CM | POA: Diagnosis not present

## 2015-02-25 DIAGNOSIS — N186 End stage renal disease: Secondary | ICD-10-CM | POA: Diagnosis not present

## 2015-02-28 DIAGNOSIS — N2581 Secondary hyperparathyroidism of renal origin: Secondary | ICD-10-CM | POA: Diagnosis not present

## 2015-02-28 DIAGNOSIS — E119 Type 2 diabetes mellitus without complications: Secondary | ICD-10-CM | POA: Diagnosis not present

## 2015-02-28 DIAGNOSIS — D509 Iron deficiency anemia, unspecified: Secondary | ICD-10-CM | POA: Diagnosis not present

## 2015-02-28 DIAGNOSIS — N186 End stage renal disease: Secondary | ICD-10-CM | POA: Diagnosis not present

## 2015-03-02 DIAGNOSIS — E119 Type 2 diabetes mellitus without complications: Secondary | ICD-10-CM | POA: Diagnosis not present

## 2015-03-02 DIAGNOSIS — N186 End stage renal disease: Secondary | ICD-10-CM | POA: Diagnosis not present

## 2015-03-02 DIAGNOSIS — N2581 Secondary hyperparathyroidism of renal origin: Secondary | ICD-10-CM | POA: Diagnosis not present

## 2015-03-02 DIAGNOSIS — D509 Iron deficiency anemia, unspecified: Secondary | ICD-10-CM | POA: Diagnosis not present

## 2015-03-04 DIAGNOSIS — D509 Iron deficiency anemia, unspecified: Secondary | ICD-10-CM | POA: Diagnosis not present

## 2015-03-04 DIAGNOSIS — N186 End stage renal disease: Secondary | ICD-10-CM | POA: Diagnosis not present

## 2015-03-04 DIAGNOSIS — N2581 Secondary hyperparathyroidism of renal origin: Secondary | ICD-10-CM | POA: Diagnosis not present

## 2015-03-04 DIAGNOSIS — E119 Type 2 diabetes mellitus without complications: Secondary | ICD-10-CM | POA: Diagnosis not present

## 2015-03-07 DIAGNOSIS — N2581 Secondary hyperparathyroidism of renal origin: Secondary | ICD-10-CM | POA: Diagnosis not present

## 2015-03-07 DIAGNOSIS — D509 Iron deficiency anemia, unspecified: Secondary | ICD-10-CM | POA: Diagnosis not present

## 2015-03-07 DIAGNOSIS — N186 End stage renal disease: Secondary | ICD-10-CM | POA: Diagnosis not present

## 2015-03-07 DIAGNOSIS — E119 Type 2 diabetes mellitus without complications: Secondary | ICD-10-CM | POA: Diagnosis not present

## 2015-03-09 DIAGNOSIS — D509 Iron deficiency anemia, unspecified: Secondary | ICD-10-CM | POA: Diagnosis not present

## 2015-03-09 DIAGNOSIS — E119 Type 2 diabetes mellitus without complications: Secondary | ICD-10-CM | POA: Diagnosis not present

## 2015-03-09 DIAGNOSIS — N2581 Secondary hyperparathyroidism of renal origin: Secondary | ICD-10-CM | POA: Diagnosis not present

## 2015-03-09 DIAGNOSIS — N186 End stage renal disease: Secondary | ICD-10-CM | POA: Diagnosis not present

## 2015-03-11 DIAGNOSIS — D509 Iron deficiency anemia, unspecified: Secondary | ICD-10-CM | POA: Diagnosis not present

## 2015-03-11 DIAGNOSIS — N2581 Secondary hyperparathyroidism of renal origin: Secondary | ICD-10-CM | POA: Diagnosis not present

## 2015-03-11 DIAGNOSIS — E119 Type 2 diabetes mellitus without complications: Secondary | ICD-10-CM | POA: Diagnosis not present

## 2015-03-11 DIAGNOSIS — N186 End stage renal disease: Secondary | ICD-10-CM | POA: Diagnosis not present

## 2015-03-13 DIAGNOSIS — F250 Schizoaffective disorder, bipolar type: Secondary | ICD-10-CM | POA: Diagnosis not present

## 2015-03-14 DIAGNOSIS — N186 End stage renal disease: Secondary | ICD-10-CM | POA: Diagnosis not present

## 2015-03-14 DIAGNOSIS — D509 Iron deficiency anemia, unspecified: Secondary | ICD-10-CM | POA: Diagnosis not present

## 2015-03-14 DIAGNOSIS — E119 Type 2 diabetes mellitus without complications: Secondary | ICD-10-CM | POA: Diagnosis not present

## 2015-03-14 DIAGNOSIS — N2581 Secondary hyperparathyroidism of renal origin: Secondary | ICD-10-CM | POA: Diagnosis not present

## 2015-03-15 DIAGNOSIS — I10 Essential (primary) hypertension: Secondary | ICD-10-CM | POA: Diagnosis not present

## 2015-03-15 DIAGNOSIS — Z1389 Encounter for screening for other disorder: Secondary | ICD-10-CM | POA: Diagnosis not present

## 2015-03-15 DIAGNOSIS — Z01118 Encounter for examination of ears and hearing with other abnormal findings: Secondary | ICD-10-CM | POA: Diagnosis not present

## 2015-03-15 DIAGNOSIS — E785 Hyperlipidemia, unspecified: Secondary | ICD-10-CM | POA: Diagnosis not present

## 2015-03-15 DIAGNOSIS — Z136 Encounter for screening for cardiovascular disorders: Secondary | ICD-10-CM | POA: Diagnosis not present

## 2015-03-15 DIAGNOSIS — K219 Gastro-esophageal reflux disease without esophagitis: Secondary | ICD-10-CM | POA: Diagnosis not present

## 2015-03-15 DIAGNOSIS — E1140 Type 2 diabetes mellitus with diabetic neuropathy, unspecified: Secondary | ICD-10-CM | POA: Diagnosis not present

## 2015-03-15 DIAGNOSIS — R000 Tachycardia, unspecified: Secondary | ICD-10-CM | POA: Diagnosis not present

## 2015-03-15 DIAGNOSIS — H538 Other visual disturbances: Secondary | ICD-10-CM | POA: Diagnosis not present

## 2015-03-15 DIAGNOSIS — F209 Schizophrenia, unspecified: Secondary | ICD-10-CM | POA: Diagnosis not present

## 2015-03-15 DIAGNOSIS — E1122 Type 2 diabetes mellitus with diabetic chronic kidney disease: Secondary | ICD-10-CM | POA: Diagnosis not present

## 2015-03-15 DIAGNOSIS — Z0000 Encounter for general adult medical examination without abnormal findings: Secondary | ICD-10-CM | POA: Diagnosis not present

## 2015-03-15 DIAGNOSIS — L0390 Cellulitis, unspecified: Secondary | ICD-10-CM | POA: Diagnosis not present

## 2015-03-15 DIAGNOSIS — Z0100 Encounter for examination of eyes and vision without abnormal findings: Secondary | ICD-10-CM | POA: Diagnosis not present

## 2015-03-16 DIAGNOSIS — N2581 Secondary hyperparathyroidism of renal origin: Secondary | ICD-10-CM | POA: Diagnosis not present

## 2015-03-16 DIAGNOSIS — N186 End stage renal disease: Secondary | ICD-10-CM | POA: Diagnosis not present

## 2015-03-16 DIAGNOSIS — E119 Type 2 diabetes mellitus without complications: Secondary | ICD-10-CM | POA: Diagnosis not present

## 2015-03-16 DIAGNOSIS — D509 Iron deficiency anemia, unspecified: Secondary | ICD-10-CM | POA: Diagnosis not present

## 2015-03-18 DIAGNOSIS — N2581 Secondary hyperparathyroidism of renal origin: Secondary | ICD-10-CM | POA: Diagnosis not present

## 2015-03-18 DIAGNOSIS — D509 Iron deficiency anemia, unspecified: Secondary | ICD-10-CM | POA: Diagnosis not present

## 2015-03-18 DIAGNOSIS — N186 End stage renal disease: Secondary | ICD-10-CM | POA: Diagnosis not present

## 2015-03-18 DIAGNOSIS — E119 Type 2 diabetes mellitus without complications: Secondary | ICD-10-CM | POA: Diagnosis not present

## 2015-03-21 DIAGNOSIS — N186 End stage renal disease: Secondary | ICD-10-CM | POA: Diagnosis not present

## 2015-03-21 DIAGNOSIS — E119 Type 2 diabetes mellitus without complications: Secondary | ICD-10-CM | POA: Diagnosis not present

## 2015-03-21 DIAGNOSIS — D509 Iron deficiency anemia, unspecified: Secondary | ICD-10-CM | POA: Diagnosis not present

## 2015-03-21 DIAGNOSIS — N2581 Secondary hyperparathyroidism of renal origin: Secondary | ICD-10-CM | POA: Diagnosis not present

## 2015-03-22 DIAGNOSIS — R000 Tachycardia, unspecified: Secondary | ICD-10-CM | POA: Diagnosis not present

## 2015-03-22 DIAGNOSIS — E1140 Type 2 diabetes mellitus with diabetic neuropathy, unspecified: Secondary | ICD-10-CM | POA: Diagnosis not present

## 2015-03-22 DIAGNOSIS — E1122 Type 2 diabetes mellitus with diabetic chronic kidney disease: Secondary | ICD-10-CM | POA: Diagnosis not present

## 2015-03-22 DIAGNOSIS — F209 Schizophrenia, unspecified: Secondary | ICD-10-CM | POA: Diagnosis not present

## 2015-03-22 DIAGNOSIS — E785 Hyperlipidemia, unspecified: Secondary | ICD-10-CM | POA: Diagnosis not present

## 2015-03-22 DIAGNOSIS — K219 Gastro-esophageal reflux disease without esophagitis: Secondary | ICD-10-CM | POA: Diagnosis not present

## 2015-03-23 DIAGNOSIS — N2581 Secondary hyperparathyroidism of renal origin: Secondary | ICD-10-CM | POA: Diagnosis not present

## 2015-03-23 DIAGNOSIS — E119 Type 2 diabetes mellitus without complications: Secondary | ICD-10-CM | POA: Diagnosis not present

## 2015-03-23 DIAGNOSIS — D509 Iron deficiency anemia, unspecified: Secondary | ICD-10-CM | POA: Diagnosis not present

## 2015-03-23 DIAGNOSIS — N186 End stage renal disease: Secondary | ICD-10-CM | POA: Diagnosis not present

## 2015-03-25 DIAGNOSIS — D509 Iron deficiency anemia, unspecified: Secondary | ICD-10-CM | POA: Diagnosis not present

## 2015-03-25 DIAGNOSIS — N2581 Secondary hyperparathyroidism of renal origin: Secondary | ICD-10-CM | POA: Diagnosis not present

## 2015-03-25 DIAGNOSIS — N186 End stage renal disease: Secondary | ICD-10-CM | POA: Diagnosis not present

## 2015-03-25 DIAGNOSIS — E119 Type 2 diabetes mellitus without complications: Secondary | ICD-10-CM | POA: Diagnosis not present

## 2015-03-28 DIAGNOSIS — N2581 Secondary hyperparathyroidism of renal origin: Secondary | ICD-10-CM | POA: Diagnosis not present

## 2015-03-28 DIAGNOSIS — D509 Iron deficiency anemia, unspecified: Secondary | ICD-10-CM | POA: Diagnosis not present

## 2015-03-28 DIAGNOSIS — N186 End stage renal disease: Secondary | ICD-10-CM | POA: Diagnosis not present

## 2015-03-28 DIAGNOSIS — Z992 Dependence on renal dialysis: Secondary | ICD-10-CM | POA: Diagnosis not present

## 2015-03-28 DIAGNOSIS — N032 Chronic nephritic syndrome with diffuse membranous glomerulonephritis: Secondary | ICD-10-CM | POA: Diagnosis not present

## 2015-03-28 DIAGNOSIS — E119 Type 2 diabetes mellitus without complications: Secondary | ICD-10-CM | POA: Diagnosis not present

## 2015-03-30 DIAGNOSIS — D509 Iron deficiency anemia, unspecified: Secondary | ICD-10-CM | POA: Diagnosis not present

## 2015-03-30 DIAGNOSIS — N186 End stage renal disease: Secondary | ICD-10-CM | POA: Diagnosis not present

## 2015-03-30 DIAGNOSIS — N2581 Secondary hyperparathyroidism of renal origin: Secondary | ICD-10-CM | POA: Diagnosis not present

## 2015-03-30 DIAGNOSIS — E119 Type 2 diabetes mellitus without complications: Secondary | ICD-10-CM | POA: Diagnosis not present

## 2015-04-12 ENCOUNTER — Ambulatory Visit (INDEPENDENT_AMBULATORY_CARE_PROVIDER_SITE_OTHER): Payer: Medicare Other | Admitting: *Deleted

## 2015-04-12 ENCOUNTER — Telehealth: Payer: Self-pay | Admitting: Cardiology

## 2015-04-12 DIAGNOSIS — I4901 Ventricular fibrillation: Secondary | ICD-10-CM

## 2015-04-12 NOTE — Telephone Encounter (Signed)
LMOVM reminding pt to send remote transmission.   

## 2015-04-13 ENCOUNTER — Encounter: Payer: Self-pay | Admitting: Cardiology

## 2015-04-13 DIAGNOSIS — I4901 Ventricular fibrillation: Secondary | ICD-10-CM

## 2015-04-13 NOTE — Progress Notes (Signed)
Remote ICD transmission.   

## 2015-04-17 LAB — CUP PACEART REMOTE DEVICE CHECK
Battery Voltage: 3 V
Brady Statistic RV Percent Paced: 0 %
Date Time Interrogation Session: 20160616104655
HighPow Impedance: 43 Ohm
HighPow Impedance: 56 Ohm
Lead Channel Impedance Value: 475 Ohm
Lead Channel Sensing Intrinsic Amplitude: 3.625 mV
Lead Channel Sensing Intrinsic Amplitude: 3.625 mV
Lead Channel Setting Pacing Amplitude: 2.5 V
Lead Channel Setting Pacing Pulse Width: 0.5 ms
Lead Channel Setting Sensing Sensitivity: 0.3 mV
Zone Setting Detection Interval: 270 ms
Zone Setting Detection Interval: 330 ms
Zone Setting Detection Interval: 390 ms
Zone Setting Detection Interval: 450 ms

## 2015-04-20 ENCOUNTER — Telehealth: Payer: Self-pay | Admitting: *Deleted

## 2015-04-20 NOTE — Telephone Encounter (Signed)
No answer, VM not set up.

## 2015-04-21 NOTE — Telephone Encounter (Signed)
2nd attempt- no answer, no VM

## 2015-04-24 ENCOUNTER — Encounter: Payer: Self-pay | Admitting: *Deleted

## 2015-04-24 NOTE — Telephone Encounter (Signed)
3rd attempt- no answer, VM message is "[facility]". Will mail letter.

## 2015-04-27 DIAGNOSIS — N032 Chronic nephritic syndrome with diffuse membranous glomerulonephritis: Secondary | ICD-10-CM | POA: Diagnosis not present

## 2015-04-27 DIAGNOSIS — N186 End stage renal disease: Secondary | ICD-10-CM | POA: Diagnosis not present

## 2015-04-27 DIAGNOSIS — Z992 Dependence on renal dialysis: Secondary | ICD-10-CM | POA: Diagnosis not present

## 2015-04-29 DIAGNOSIS — E119 Type 2 diabetes mellitus without complications: Secondary | ICD-10-CM | POA: Diagnosis not present

## 2015-04-29 DIAGNOSIS — N186 End stage renal disease: Secondary | ICD-10-CM | POA: Diagnosis not present

## 2015-04-29 DIAGNOSIS — N2581 Secondary hyperparathyroidism of renal origin: Secondary | ICD-10-CM | POA: Diagnosis not present

## 2015-04-29 DIAGNOSIS — D509 Iron deficiency anemia, unspecified: Secondary | ICD-10-CM | POA: Diagnosis not present

## 2015-05-02 DIAGNOSIS — N186 End stage renal disease: Secondary | ICD-10-CM | POA: Diagnosis not present

## 2015-05-02 DIAGNOSIS — N2581 Secondary hyperparathyroidism of renal origin: Secondary | ICD-10-CM | POA: Diagnosis not present

## 2015-05-02 DIAGNOSIS — D509 Iron deficiency anemia, unspecified: Secondary | ICD-10-CM | POA: Diagnosis not present

## 2015-05-02 DIAGNOSIS — E119 Type 2 diabetes mellitus without complications: Secondary | ICD-10-CM | POA: Diagnosis not present

## 2015-05-03 ENCOUNTER — Encounter: Payer: Self-pay | Admitting: Internal Medicine

## 2015-05-04 DIAGNOSIS — N2581 Secondary hyperparathyroidism of renal origin: Secondary | ICD-10-CM | POA: Diagnosis not present

## 2015-05-04 DIAGNOSIS — N186 End stage renal disease: Secondary | ICD-10-CM | POA: Diagnosis not present

## 2015-05-04 DIAGNOSIS — E119 Type 2 diabetes mellitus without complications: Secondary | ICD-10-CM | POA: Diagnosis not present

## 2015-05-04 DIAGNOSIS — D509 Iron deficiency anemia, unspecified: Secondary | ICD-10-CM | POA: Diagnosis not present

## 2015-05-06 DIAGNOSIS — N2581 Secondary hyperparathyroidism of renal origin: Secondary | ICD-10-CM | POA: Diagnosis not present

## 2015-05-06 DIAGNOSIS — E119 Type 2 diabetes mellitus without complications: Secondary | ICD-10-CM | POA: Diagnosis not present

## 2015-05-06 DIAGNOSIS — D509 Iron deficiency anemia, unspecified: Secondary | ICD-10-CM | POA: Diagnosis not present

## 2015-05-06 DIAGNOSIS — N186 End stage renal disease: Secondary | ICD-10-CM | POA: Diagnosis not present

## 2015-05-09 DIAGNOSIS — E119 Type 2 diabetes mellitus without complications: Secondary | ICD-10-CM | POA: Diagnosis not present

## 2015-05-09 DIAGNOSIS — N186 End stage renal disease: Secondary | ICD-10-CM | POA: Diagnosis not present

## 2015-05-09 DIAGNOSIS — N2581 Secondary hyperparathyroidism of renal origin: Secondary | ICD-10-CM | POA: Diagnosis not present

## 2015-05-09 DIAGNOSIS — D509 Iron deficiency anemia, unspecified: Secondary | ICD-10-CM | POA: Diagnosis not present

## 2015-05-11 DIAGNOSIS — E1129 Type 2 diabetes mellitus with other diabetic kidney complication: Secondary | ICD-10-CM | POA: Diagnosis not present

## 2015-05-11 DIAGNOSIS — N186 End stage renal disease: Secondary | ICD-10-CM | POA: Diagnosis not present

## 2015-05-11 DIAGNOSIS — E119 Type 2 diabetes mellitus without complications: Secondary | ICD-10-CM | POA: Diagnosis not present

## 2015-05-11 DIAGNOSIS — D509 Iron deficiency anemia, unspecified: Secondary | ICD-10-CM | POA: Diagnosis not present

## 2015-05-11 DIAGNOSIS — N2581 Secondary hyperparathyroidism of renal origin: Secondary | ICD-10-CM | POA: Diagnosis not present

## 2015-05-12 DIAGNOSIS — E1140 Type 2 diabetes mellitus with diabetic neuropathy, unspecified: Secondary | ICD-10-CM | POA: Diagnosis not present

## 2015-05-12 DIAGNOSIS — F209 Schizophrenia, unspecified: Secondary | ICD-10-CM | POA: Diagnosis not present

## 2015-05-12 DIAGNOSIS — K219 Gastro-esophageal reflux disease without esophagitis: Secondary | ICD-10-CM | POA: Diagnosis not present

## 2015-05-12 DIAGNOSIS — E1122 Type 2 diabetes mellitus with diabetic chronic kidney disease: Secondary | ICD-10-CM | POA: Diagnosis not present

## 2015-05-12 DIAGNOSIS — E785 Hyperlipidemia, unspecified: Secondary | ICD-10-CM | POA: Diagnosis not present

## 2015-05-12 DIAGNOSIS — R079 Chest pain, unspecified: Secondary | ICD-10-CM | POA: Diagnosis not present

## 2015-05-13 DIAGNOSIS — N2581 Secondary hyperparathyroidism of renal origin: Secondary | ICD-10-CM | POA: Diagnosis not present

## 2015-05-13 DIAGNOSIS — D509 Iron deficiency anemia, unspecified: Secondary | ICD-10-CM | POA: Diagnosis not present

## 2015-05-13 DIAGNOSIS — N186 End stage renal disease: Secondary | ICD-10-CM | POA: Diagnosis not present

## 2015-05-13 DIAGNOSIS — E119 Type 2 diabetes mellitus without complications: Secondary | ICD-10-CM | POA: Diagnosis not present

## 2015-05-16 DIAGNOSIS — N186 End stage renal disease: Secondary | ICD-10-CM | POA: Diagnosis not present

## 2015-05-16 DIAGNOSIS — E119 Type 2 diabetes mellitus without complications: Secondary | ICD-10-CM | POA: Diagnosis not present

## 2015-05-16 DIAGNOSIS — N2581 Secondary hyperparathyroidism of renal origin: Secondary | ICD-10-CM | POA: Diagnosis not present

## 2015-05-16 DIAGNOSIS — D509 Iron deficiency anemia, unspecified: Secondary | ICD-10-CM | POA: Diagnosis not present

## 2015-05-18 DIAGNOSIS — N2581 Secondary hyperparathyroidism of renal origin: Secondary | ICD-10-CM | POA: Diagnosis not present

## 2015-05-18 DIAGNOSIS — N186 End stage renal disease: Secondary | ICD-10-CM | POA: Diagnosis not present

## 2015-05-18 DIAGNOSIS — D509 Iron deficiency anemia, unspecified: Secondary | ICD-10-CM | POA: Diagnosis not present

## 2015-05-18 DIAGNOSIS — E119 Type 2 diabetes mellitus without complications: Secondary | ICD-10-CM | POA: Diagnosis not present

## 2015-05-20 DIAGNOSIS — E119 Type 2 diabetes mellitus without complications: Secondary | ICD-10-CM | POA: Diagnosis not present

## 2015-05-20 DIAGNOSIS — D509 Iron deficiency anemia, unspecified: Secondary | ICD-10-CM | POA: Diagnosis not present

## 2015-05-20 DIAGNOSIS — N186 End stage renal disease: Secondary | ICD-10-CM | POA: Diagnosis not present

## 2015-05-20 DIAGNOSIS — N2581 Secondary hyperparathyroidism of renal origin: Secondary | ICD-10-CM | POA: Diagnosis not present

## 2015-05-23 DIAGNOSIS — N186 End stage renal disease: Secondary | ICD-10-CM | POA: Diagnosis not present

## 2015-05-23 DIAGNOSIS — E119 Type 2 diabetes mellitus without complications: Secondary | ICD-10-CM | POA: Diagnosis not present

## 2015-05-23 DIAGNOSIS — N2581 Secondary hyperparathyroidism of renal origin: Secondary | ICD-10-CM | POA: Diagnosis not present

## 2015-05-23 DIAGNOSIS — D509 Iron deficiency anemia, unspecified: Secondary | ICD-10-CM | POA: Diagnosis not present

## 2015-05-25 DIAGNOSIS — N2581 Secondary hyperparathyroidism of renal origin: Secondary | ICD-10-CM | POA: Diagnosis not present

## 2015-05-25 DIAGNOSIS — N186 End stage renal disease: Secondary | ICD-10-CM | POA: Diagnosis not present

## 2015-05-25 DIAGNOSIS — D509 Iron deficiency anemia, unspecified: Secondary | ICD-10-CM | POA: Diagnosis not present

## 2015-05-25 DIAGNOSIS — E119 Type 2 diabetes mellitus without complications: Secondary | ICD-10-CM | POA: Diagnosis not present

## 2015-05-27 DIAGNOSIS — D509 Iron deficiency anemia, unspecified: Secondary | ICD-10-CM | POA: Diagnosis not present

## 2015-05-27 DIAGNOSIS — N186 End stage renal disease: Secondary | ICD-10-CM | POA: Diagnosis not present

## 2015-05-27 DIAGNOSIS — N2581 Secondary hyperparathyroidism of renal origin: Secondary | ICD-10-CM | POA: Diagnosis not present

## 2015-05-27 DIAGNOSIS — E119 Type 2 diabetes mellitus without complications: Secondary | ICD-10-CM | POA: Diagnosis not present

## 2015-05-28 DIAGNOSIS — Z992 Dependence on renal dialysis: Secondary | ICD-10-CM | POA: Diagnosis not present

## 2015-05-28 DIAGNOSIS — N186 End stage renal disease: Secondary | ICD-10-CM | POA: Diagnosis not present

## 2015-05-28 DIAGNOSIS — N032 Chronic nephritic syndrome with diffuse membranous glomerulonephritis: Secondary | ICD-10-CM | POA: Diagnosis not present

## 2015-05-30 DIAGNOSIS — E119 Type 2 diabetes mellitus without complications: Secondary | ICD-10-CM | POA: Diagnosis not present

## 2015-05-30 DIAGNOSIS — N2581 Secondary hyperparathyroidism of renal origin: Secondary | ICD-10-CM | POA: Diagnosis not present

## 2015-05-30 DIAGNOSIS — N186 End stage renal disease: Secondary | ICD-10-CM | POA: Diagnosis not present

## 2015-05-30 DIAGNOSIS — D509 Iron deficiency anemia, unspecified: Secondary | ICD-10-CM | POA: Diagnosis not present

## 2015-06-12 DIAGNOSIS — F250 Schizoaffective disorder, bipolar type: Secondary | ICD-10-CM | POA: Diagnosis not present

## 2015-06-28 DIAGNOSIS — N186 End stage renal disease: Secondary | ICD-10-CM | POA: Diagnosis not present

## 2015-06-28 DIAGNOSIS — N032 Chronic nephritic syndrome with diffuse membranous glomerulonephritis: Secondary | ICD-10-CM | POA: Diagnosis not present

## 2015-06-28 DIAGNOSIS — Z992 Dependence on renal dialysis: Secondary | ICD-10-CM | POA: Diagnosis not present

## 2015-06-29 DIAGNOSIS — E119 Type 2 diabetes mellitus without complications: Secondary | ICD-10-CM | POA: Diagnosis not present

## 2015-06-29 DIAGNOSIS — N2581 Secondary hyperparathyroidism of renal origin: Secondary | ICD-10-CM | POA: Diagnosis not present

## 2015-06-29 DIAGNOSIS — D509 Iron deficiency anemia, unspecified: Secondary | ICD-10-CM | POA: Diagnosis not present

## 2015-06-29 DIAGNOSIS — N186 End stage renal disease: Secondary | ICD-10-CM | POA: Diagnosis not present

## 2015-07-17 ENCOUNTER — Encounter: Payer: Medicare Other | Admitting: *Deleted

## 2015-07-17 ENCOUNTER — Telehealth: Payer: Self-pay | Admitting: Cardiology

## 2015-07-17 NOTE — Telephone Encounter (Signed)
Spoke with pt and reminded pt of remote transmission that is due today. Pt verbalized understanding.   

## 2015-07-19 ENCOUNTER — Encounter: Payer: Self-pay | Admitting: Cardiology

## 2015-07-28 DIAGNOSIS — Z992 Dependence on renal dialysis: Secondary | ICD-10-CM | POA: Diagnosis not present

## 2015-07-28 DIAGNOSIS — N186 End stage renal disease: Secondary | ICD-10-CM | POA: Diagnosis not present

## 2015-07-28 DIAGNOSIS — N032 Chronic nephritic syndrome with diffuse membranous glomerulonephritis: Secondary | ICD-10-CM | POA: Diagnosis not present

## 2015-07-29 DIAGNOSIS — E119 Type 2 diabetes mellitus without complications: Secondary | ICD-10-CM | POA: Diagnosis not present

## 2015-07-29 DIAGNOSIS — D509 Iron deficiency anemia, unspecified: Secondary | ICD-10-CM | POA: Diagnosis not present

## 2015-07-29 DIAGNOSIS — N2581 Secondary hyperparathyroidism of renal origin: Secondary | ICD-10-CM | POA: Diagnosis not present

## 2015-07-29 DIAGNOSIS — N186 End stage renal disease: Secondary | ICD-10-CM | POA: Diagnosis not present

## 2015-08-10 DIAGNOSIS — E1129 Type 2 diabetes mellitus with other diabetic kidney complication: Secondary | ICD-10-CM | POA: Diagnosis not present

## 2015-08-28 DIAGNOSIS — K219 Gastro-esophageal reflux disease without esophagitis: Secondary | ICD-10-CM | POA: Diagnosis not present

## 2015-08-28 DIAGNOSIS — E1140 Type 2 diabetes mellitus with diabetic neuropathy, unspecified: Secondary | ICD-10-CM | POA: Diagnosis not present

## 2015-08-28 DIAGNOSIS — E1122 Type 2 diabetes mellitus with diabetic chronic kidney disease: Secondary | ICD-10-CM | POA: Diagnosis not present

## 2015-08-28 DIAGNOSIS — E785 Hyperlipidemia, unspecified: Secondary | ICD-10-CM | POA: Diagnosis not present

## 2015-08-28 DIAGNOSIS — N032 Chronic nephritic syndrome with diffuse membranous glomerulonephritis: Secondary | ICD-10-CM | POA: Diagnosis not present

## 2015-08-28 DIAGNOSIS — F209 Schizophrenia, unspecified: Secondary | ICD-10-CM | POA: Diagnosis not present

## 2015-08-28 DIAGNOSIS — Z992 Dependence on renal dialysis: Secondary | ICD-10-CM | POA: Diagnosis not present

## 2015-08-28 DIAGNOSIS — R05 Cough: Secondary | ICD-10-CM | POA: Diagnosis not present

## 2015-08-28 DIAGNOSIS — N186 End stage renal disease: Secondary | ICD-10-CM | POA: Diagnosis not present

## 2015-08-28 DIAGNOSIS — R1013 Epigastric pain: Secondary | ICD-10-CM | POA: Diagnosis not present

## 2015-08-29 DIAGNOSIS — D509 Iron deficiency anemia, unspecified: Secondary | ICD-10-CM | POA: Diagnosis not present

## 2015-08-29 DIAGNOSIS — N2581 Secondary hyperparathyroidism of renal origin: Secondary | ICD-10-CM | POA: Diagnosis not present

## 2015-08-29 DIAGNOSIS — E119 Type 2 diabetes mellitus without complications: Secondary | ICD-10-CM | POA: Diagnosis not present

## 2015-08-29 DIAGNOSIS — N186 End stage renal disease: Secondary | ICD-10-CM | POA: Diagnosis not present

## 2015-09-06 ENCOUNTER — Emergency Department (HOSPITAL_COMMUNITY): Payer: Medicare Other

## 2015-09-06 ENCOUNTER — Emergency Department (HOSPITAL_COMMUNITY)
Admission: EM | Admit: 2015-09-06 | Discharge: 2015-09-06 | Disposition: A | Discharge status: Home / Self Care | Payer: Medicare Other | Attending: Emergency Medicine | Admitting: Emergency Medicine

## 2015-09-06 ENCOUNTER — Encounter (HOSPITAL_COMMUNITY): Payer: Self-pay

## 2015-09-06 DIAGNOSIS — Z79899 Other long term (current) drug therapy: Secondary | ICD-10-CM | POA: Diagnosis not present

## 2015-09-06 DIAGNOSIS — Z8619 Personal history of other infectious and parasitic diseases: Secondary | ICD-10-CM | POA: Insufficient documentation

## 2015-09-06 DIAGNOSIS — R1031 Right lower quadrant pain: Secondary | ICD-10-CM | POA: Diagnosis not present

## 2015-09-06 DIAGNOSIS — Z9889 Other specified postprocedural states: Secondary | ICD-10-CM | POA: Insufficient documentation

## 2015-09-06 DIAGNOSIS — Z792 Long term (current) use of antibiotics: Secondary | ICD-10-CM | POA: Diagnosis not present

## 2015-09-06 DIAGNOSIS — I129 Hypertensive chronic kidney disease with stage 1 through stage 4 chronic kidney disease, or unspecified chronic kidney disease: Secondary | ICD-10-CM | POA: Diagnosis not present

## 2015-09-06 DIAGNOSIS — N189 Chronic kidney disease, unspecified: Secondary | ICD-10-CM | POA: Diagnosis not present

## 2015-09-06 DIAGNOSIS — Z872 Personal history of diseases of the skin and subcutaneous tissue: Secondary | ICD-10-CM | POA: Insufficient documentation

## 2015-09-06 DIAGNOSIS — Z862 Personal history of diseases of the blood and blood-forming organs and certain disorders involving the immune mechanism: Secondary | ICD-10-CM | POA: Diagnosis not present

## 2015-09-06 DIAGNOSIS — E785 Hyperlipidemia, unspecified: Secondary | ICD-10-CM | POA: Insufficient documentation

## 2015-09-06 DIAGNOSIS — F209 Schizophrenia, unspecified: Secondary | ICD-10-CM | POA: Diagnosis not present

## 2015-09-06 DIAGNOSIS — Z87891 Personal history of nicotine dependence: Secondary | ICD-10-CM | POA: Diagnosis not present

## 2015-09-06 DIAGNOSIS — Z794 Long term (current) use of insulin: Secondary | ICD-10-CM | POA: Diagnosis not present

## 2015-09-06 DIAGNOSIS — Z95810 Presence of automatic (implantable) cardiac defibrillator: Secondary | ICD-10-CM | POA: Insufficient documentation

## 2015-09-06 DIAGNOSIS — R1011 Right upper quadrant pain: Secondary | ICD-10-CM | POA: Diagnosis not present

## 2015-09-06 DIAGNOSIS — R1084 Generalized abdominal pain: Secondary | ICD-10-CM | POA: Diagnosis not present

## 2015-09-06 LAB — CBC
HCT: 39.2 % (ref 39.0–52.0)
Hemoglobin: 13.2 g/dL (ref 13.0–17.0)
MCH: 32.3 pg (ref 26.0–34.0)
MCHC: 33.7 g/dL (ref 30.0–36.0)
MCV: 95.8 fL (ref 78.0–100.0)
Platelets: 277 10*3/uL (ref 150–400)
RBC: 4.09 MIL/uL — ABNORMAL LOW (ref 4.22–5.81)
RDW: 13.8 % (ref 11.5–15.5)
WBC: 8.5 10*3/uL (ref 4.0–10.5)

## 2015-09-06 LAB — COMPREHENSIVE METABOLIC PANEL
ALT: 27 U/L (ref 17–63)
AST: 23 U/L (ref 15–41)
Albumin: 3.9 g/dL (ref 3.5–5.0)
Alkaline Phosphatase: 70 U/L (ref 38–126)
Anion gap: 13 (ref 5–15)
BUN: 28 mg/dL — ABNORMAL HIGH (ref 6–20)
CO2: 27 mmol/L (ref 22–32)
Calcium: 10 mg/dL (ref 8.9–10.3)
Chloride: 98 mmol/L — ABNORMAL LOW (ref 101–111)
Creatinine, Ser: 7.11 mg/dL — ABNORMAL HIGH (ref 0.61–1.24)
GFR calc Af Amer: 9 mL/min — ABNORMAL LOW (ref >60)
GFR calc non Af Amer: 8 mL/min — ABNORMAL LOW (ref >60)
Glucose, Bld: 92 mg/dL (ref 65–99)
Potassium: 4.1 mmol/L (ref 3.5–5.1)
Sodium: 138 mmol/L (ref 135–145)
Total Bilirubin: 0.8 mg/dL (ref 0.3–1.2)
Total Protein: 8.1 g/dL (ref 6.5–8.1)

## 2015-09-06 LAB — LIPASE, BLOOD: Lipase: 45 U/L (ref 11–51)

## 2015-09-06 MED ORDER — HYDROMORPHONE HCL 1 MG/ML IJ SOLN
1.0000 mg | Freq: Once | INTRAMUSCULAR | Status: AC
  Administered 2015-09-06: 1 mg via INTRAVENOUS
  Filled 2015-09-06: qty 1

## 2015-09-06 MED ORDER — HYDROMORPHONE HCL 1 MG/ML IJ SOLN: 2.0000 mg | Freq: Once | INTRAMUSCULAR | Status: DC

## 2015-09-06 MED ORDER — HYDROMORPHONE HCL 1 MG/ML IJ SOLN: 1.0000 mg | Freq: Once | INTRAMUSCULAR | Status: DC

## 2015-09-06 MED ORDER — OXYCODONE-ACETAMINOPHEN 5-325 MG PO TABS: 2.0000 | ORAL_TABLET | ORAL | Status: DC | PRN

## 2015-09-06 MED ORDER — OXYCODONE-ACETAMINOPHEN 5-325 MG PO TABS
2.0000 | ORAL_TABLET | Freq: Once | ORAL | Status: AC
  Administered 2015-09-06: 2 via ORAL
  Filled 2015-09-06: qty 2

## 2015-09-06 NOTE — ED Notes (Signed)
Pt stable, ambulatory, states understanding of discharge instructions 

## 2015-09-06 NOTE — ED Notes (Signed)
In/out cath completed requested by PA no urine return.

## 2015-09-06 NOTE — Discharge Instructions (Signed)
Mr. Boase and Family, Nice meeting you! Please follow-up with your primary care provider within one week. Please return to the emergency department if you develop fevers, vomiting, or have increasing pain.   Farrel Gordon, PA-C  Abdominal Pain, Adult Many things can cause abdominal pain. Usually, abdominal pain is not caused by a disease and will improve without treatment. It can often be observed and treated at home. Your health care provider will do a physical exam and possibly order blood tests and X-rays to help determine the seriousness of your pain. However, in many cases, more time must pass before a clear cause of the pain can be found. Before that point, your health care provider may not know if you need more testing or further treatment. HOME CARE INSTRUCTIONS Monitor your abdominal pain for any changes. The following actions may help to alleviate any discomfort you are experiencing:  Only take over-the-counter or prescription medicines as directed by your health care provider.  Do not take laxatives unless directed to do so by your health care provider.  Try a clear liquid diet (broth, tea, or water) as directed by your health care provider. Slowly move to a bland diet as tolerated. SEEK MEDICAL CARE IF:  You have unexplained abdominal pain.  You have abdominal pain associated with nausea or diarrhea.  You have pain when you urinate or have a bowel movement.  You experience abdominal pain that wakes you in the night.  You have abdominal pain that is worsened or improved by eating food.  You have abdominal pain that is worsened with eating fatty foods.  You have a fever. SEEK IMMEDIATE MEDICAL CARE IF:  Your pain does not go away within 2 hours.  You keep throwing up (vomiting).  Your pain is felt only in portions of the abdomen, such as the right side or the left lower portion of the abdomen.  You pass bloody or black tarry stools. MAKE SURE YOU:  Understand  these instructions.  Will watch your condition.  Will get help right away if you are not doing well or get worse.   This information is not intended to replace advice given to you by your health care provider. Make sure you discuss any questions you have with your health care provider.   Document Released: 07/24/2005 Document Revised: 07/05/2015 Document Reviewed: 06/23/2013 Elsevier Interactive Patient Education [employer].

## 2015-09-06 NOTE — ED Notes (Signed)
Pt.s brother reports that pt. Is a dialysis pt. And is unable to urinate. Pt.urinates approximately 5 times a day and today he is unable too.  Also reports that his abdomen is burning.  Pt. Denies any n/v/d.  Skin is warm, pink and dry.

## 2015-09-06 NOTE — ED Provider Notes (Signed)
CSN: [id number]     Arrival date & time 09/06/15  1754 History   First MD Initiated Contact with Patient 09/06/15 2009     Chief Complaint  Patient presents with  . Abdominal Pain    urinary retention   HPI   Duey Behrman is a 49 y.o. M PMH significant for CKD (on HD T, TH, S) HTN, schizophrenia presenting with a 1 day history of inability to void and abdominal pain. His brother, present at bedside, explains the patient does not speak [place], but has been having urinary issues. He usually urinates 5x/day, but has had increased urgency. He also endorses abdominal pain that is RUQ in location, radiates inferiorly, 8/10 pain scale, constant, burning. No fevers, chills, CP, N/V/D, dysuria, hematochezia, urethral discharge, recent antibiotic use, ill contacts.   Past Medical History  Diagnosis Date  . Hypertension   . Dyslipidemia   . Anemia   . Cellulitis   . Atrial fibrillation   . Enterobacter sepsis ([place])   . Schizophrenia ([place])   . Chronic kidney disease   . Cardiomyopathy ([place]) 2010    Unclear Etiology: Last Echo 06/2009: EF 40-45%, severe Lateral & apical Hypokinesis (? CAD) ; Grade 2 DDysfxn. Mild conc LVH.   . S/P ICD (internal cardiac defibrillator) procedure 2010    VT Arrest (in [place])  . Hyperlipidemia    Past Surgical History  Procedure Laterality Date  . Cardiac defibrillator placement  2010    [place]  . Av fistula placement Right 02/10/2013    Procedure: ARTERIOVENOUS (AV) FISTULA CREATION;  Surgeon: Rosetta Posner, MD;  Location: [facility] OR;  Service: Vascular;  Laterality: Right;  Right forearm radial/cephalic arterovenous fistula.   . Ligation of competing branches of arteriovenous fistula Right 08/13/2013    Procedure: LIGATION OF COMPETING BRANCHES X5 OF ARTERIOVENOUS FISTULA- RIGHT ARM;  Surgeon: Serafina Mitchell, MD;  Location: [place];  Service: Vascular;  Laterality: Right;  . Cardiac catheterization  2010    [place]: In setting of VT arrest. Per brother's report,  nonobstructive with no intervention  . Fistulogram Right 08/09/2013    Procedure: FISTULOGRAM;  Surgeon: Angelia Mould, MD;  Location[facility] C[facility] LAB;  Service: Cardiovascular;  Laterality: Right;   Family History  Problem Relation Age of Onset  . CAD Mother    Social History  Substance Use Topics  . Smoking status: Former Smoker    Quit date: 10/28/2008  . Smokeless tobacco: Never Used  . Alcohol Use: No    Review of Systems  Ten systems are reviewed and are negative for acute change except as noted in the HPI   Allergies  Review of patient's allergies indicates no known allergies.  Home Medications   Prior to Admission medications   Medication Sig Start Date End Date Taking? Authorizing Provider  calcitRIOL (ROCALTROL) 0.5 MCG capsule Take 2 capsules (1 mcg total) by mouth Every Tuesday,Thursday,and Saturday with dialysis. 11/07/14   Bynum Bellows, MD  calcium acetate (PHOSLO) 667 MG capsule Take 1,334 mg by mouth 3 (three) times daily with meals. 8a, 12p, 5p 02/11/13   Belkys A Regalado, MD  gabapentin (NEURONTIN) 300 MG capsule Take 300 mg by mouth 2 (two) times daily.    Historical Provider, MD  insulin detemir (LEVEMIR) 100 UNIT/ML injection Inject 0.05 mLs (5 Units total) into the skin at bedtime. 02/11/13   Belkys A Regalado, MD  metoprolol tartrate (LOPRESSOR) 25 MG tablet Take 1 tablet (25 mg total) by mouth 2 (two)  times daily. 01/11/15   Evans Lance, MD  Multiple Vitamin (MULTIVITAMIN WITH MINERALS) TABS tablet Take 1 tablet by mouth every morning.     Historical Provider, MD  omeprazole (PRILOSEC) 20 MG capsule Take 20 mg by mouth every morning.     Historical Provider, MD  oxyCODONE-acetaminophen (PERCOCET/ROXICET) 5-325 MG tablet Take 2 tablets by mouth every 4 (four) hours as needed for severe pain. 09/06/15   Vermilion Lions, PA-C  pravastatin (PRAVACHOL) 20 MG tablet Take 20 mg by mouth at bedtime.    Historical Provider, MD  risperiDONE (RISPERDAL)  0.25 MG tablet Take 0.25 mg by mouth at bedtime.    Historical Provider, MD   BP 130/83 mmHg  Pulse 74  Temp(Src) 97.7 F (36.5 C) (Oral)  Resp 14  SpO2 97% Physical Exam  Constitutional: He appears well-developed and well-nourished. No distress.  HENT:  Head: Normocephalic and atraumatic.  Mouth/Throat: Oropharynx is clear and moist. No oropharyngeal exudate.  Eyes: Conjunctivae are normal. Pupils are equal, round, and reactive to light. Right eye exhibits no discharge. Left eye exhibits no discharge. No scleral icterus.  Neck: No tracheal deviation present.  Cardiovascular: Normal rate, regular rhythm, normal heart sounds and intact distal pulses.  Exam reveals no gallop and no friction rub.   No murmur heard. Pulmonary/Chest: Effort normal and breath sounds normal. No respiratory distress. He has no wheezes. He has no rales. He exhibits no tenderness.  Abdominal: Soft. Bowel sounds are normal. He exhibits no distension and no mass. There is tenderness. There is no rebound and no guarding.  RUQ and suprapubic tenderness. No CVA tenderness.   Genitourinary: Penis normal.  Musculoskeletal: Normal range of motion. He exhibits no edema.  Lymphadenopathy:    He has no cervical adenopathy.  Neurological: He is alert. Coordination normal.  Skin: Skin is warm and dry. No rash noted. He is not diaphoretic. No erythema.  Psychiatric: He has a normal mood and affect. His behavior is normal.  Nursing note and vitals reviewed.   ED Course  [place] bedside Date/Time: 09/06/2015 9:20 PM Performed by: Jola Schmidt Authorized by: Jola Schmidt Consent: Verbal consent obtained. Patient understanding: patient states understanding of the procedure being performed Patient identity confirmed: verbally with patient and arm band Local anesthesia used: no Patient sedated: no Patient tolerance: Patient tolerated the procedure well with no immediate complications Comments: No acute findings seen during US  abdomen at bedside.    .  Labs Review Labs Reviewed  COMPREHENSIVE METABOLIC PANEL - Abnormal; Notable for the following:    Chloride 98 (*)    BUN 28 (*)    Creatinine, Ser 7.11 (*)    GFR calc non Af Amer 8 (*)    GFR calc Af Amer 9 (*)    All other components within normal limits  CBC - Abnormal; Notable for the following:    RBC 4.09 (*)    All other components within normal limits  LIPASE, BLOOD    Imaging Review CT Abdomen Pelvis Wo Contrast (Final result) Result time: 09/06/15 22:26:47   Final result by Rad Results In Interface (09/06/15 22:26:47)   Narrative:   CLINICAL DATA: Right upper quadrant abdominal pain radiating to the right lower quadrant. Dialysis patient.  EXAM: CT ABDOMEN AND PELVIS WITHOUT CONTRAST  TECHNIQUE: Multidetector CT imaging of the abdomen and pelvis was performed following the standard protocol without IV contrast.  COMPARISON: CT 02/05/2013  FINDINGS: Lower chest: Pacemaker or partially included. There is cardiomegaly. Minimal atelectasis  in the left lower and right middle lobes.  Liver: Normal in size, no focal lesion allowing for lack of intravenous contrast.  Hepatobiliary: Gallbladder physiologically distended, no calcified gallstone. No pericholecystic inflammatory change. No biliary dilatation.  Pancreas: Normal, allowing for mild patient motion through this region.  Spleen: Normal.  Adrenal glands: No nodule.  Kidneys: Progressive bilateral renal parenchymal atrophy from prior exam. There is minimal high-density material in the right renal pelvis. No hydronephrosis or perinephric stranding.  Stomach/Bowel: Stomach physiologically distended. There are no dilated or thickened small bowel loops. Small volume of stool throughout the colon without colonic wall thickening. Minimal diverticulosis of the distal descending colon. The appendix is normal.  Vascular/Lymphatic: Small upper retroperitoneal lymph nodes,  however diminished in degree from prior exam. No pathologic retroperitoneal or mesenteric adenopathy. Abdominal aorta is normal in caliber.  Reproductive: Prostate gland normal in size.  Bladder: Small volume of urine in the bladder. No wall thickening. Probable urethral remnant again seen.  Other: No free air, free fluid, or intra-abdominal fluid collection.  Musculoskeletal: There are no acute or suspicious osseous abnormalities. Degenerative disc disease at L5-S1.  IMPRESSION: 1. Minimal high-density material in the right renal pelvis, may be related hematuria, however no frank hydronephrosis. Progressive bilateral renal atrophy from 2014. 2. Otherwise no acute abnormality in the abdomen/pelvis. Minimal distal colonic diverticulosis without diverticulitis.   Electronically Signed By: Jeb Levering M.D. On: 09/06/2015 22:26   I have personally reviewed and evaluated these images and lab results as part of my medical decision-making.  MDM   Final diagnoses:  Generalized abdominal pain   Patient appears uncomfortable but non-toxic. VSS. Will get CBC, lipase, CMP, bladder scan.  Dr. Venora Maples to see patient. Bedside abdominal US performed, without acute findings. He advised CT scan of abdomen for cholecystitis/lithiasis vs. Renal colic.   Bladder scan resulted with 80 mL and RN told me she will not be able to perform in and out cath for UA.   CT scan demonstrated no hydronephrosis and minimal high-density material in right renal pelvis. No acute abnormality in abdomen/pelvis. Explained results to patient and brother. Patient may be safely discharged home. Discussed reasons for return. Patient to follow-up with primary care provider within one week. Patient's brother in understanding and agreement with the plan.     Deltana Lions, PA-C 09/09/15 [place], MD 09/09/15 [phone]

## 2015-09-27 DIAGNOSIS — N186 End stage renal disease: Secondary | ICD-10-CM | POA: Diagnosis not present

## 2015-09-27 DIAGNOSIS — Z992 Dependence on renal dialysis: Secondary | ICD-10-CM | POA: Diagnosis not present

## 2015-09-27 DIAGNOSIS — N032 Chronic nephritic syndrome with diffuse membranous glomerulonephritis: Secondary | ICD-10-CM | POA: Diagnosis not present

## 2015-09-28 DIAGNOSIS — N186 End stage renal disease: Secondary | ICD-10-CM | POA: Diagnosis not present

## 2015-09-28 DIAGNOSIS — N2581 Secondary hyperparathyroidism of renal origin: Secondary | ICD-10-CM | POA: Diagnosis not present

## 2015-09-28 DIAGNOSIS — E119 Type 2 diabetes mellitus without complications: Secondary | ICD-10-CM | POA: Diagnosis not present

## 2015-09-28 DIAGNOSIS — D509 Iron deficiency anemia, unspecified: Secondary | ICD-10-CM | POA: Diagnosis not present

## 2015-10-28 DIAGNOSIS — N032 Chronic nephritic syndrome with diffuse membranous glomerulonephritis: Secondary | ICD-10-CM | POA: Diagnosis not present

## 2015-10-28 DIAGNOSIS — Z992 Dependence on renal dialysis: Secondary | ICD-10-CM | POA: Diagnosis not present

## 2015-10-28 DIAGNOSIS — N186 End stage renal disease: Secondary | ICD-10-CM | POA: Diagnosis not present

## 2015-10-31 DIAGNOSIS — Z23 Encounter for immunization: Secondary | ICD-10-CM | POA: Diagnosis not present

## 2015-10-31 DIAGNOSIS — N186 End stage renal disease: Secondary | ICD-10-CM | POA: Diagnosis not present

## 2015-10-31 DIAGNOSIS — E119 Type 2 diabetes mellitus without complications: Secondary | ICD-10-CM | POA: Diagnosis not present

## 2015-10-31 DIAGNOSIS — D509 Iron deficiency anemia, unspecified: Secondary | ICD-10-CM | POA: Diagnosis not present

## 2015-10-31 DIAGNOSIS — N2581 Secondary hyperparathyroidism of renal origin: Secondary | ICD-10-CM | POA: Diagnosis not present

## 2015-11-01 DIAGNOSIS — E1140 Type 2 diabetes mellitus with diabetic neuropathy, unspecified: Secondary | ICD-10-CM | POA: Diagnosis not present

## 2015-11-01 DIAGNOSIS — K219 Gastro-esophageal reflux disease without esophagitis: Secondary | ICD-10-CM | POA: Diagnosis not present

## 2015-11-01 DIAGNOSIS — I10 Essential (primary) hypertension: Secondary | ICD-10-CM | POA: Diagnosis not present

## 2015-11-01 DIAGNOSIS — E785 Hyperlipidemia, unspecified: Secondary | ICD-10-CM | POA: Diagnosis not present

## 2015-11-01 DIAGNOSIS — E1122 Type 2 diabetes mellitus with diabetic chronic kidney disease: Secondary | ICD-10-CM | POA: Diagnosis not present

## 2015-11-02 DIAGNOSIS — Z23 Encounter for immunization: Secondary | ICD-10-CM | POA: Diagnosis not present

## 2015-11-02 DIAGNOSIS — N2581 Secondary hyperparathyroidism of renal origin: Secondary | ICD-10-CM | POA: Diagnosis not present

## 2015-11-02 DIAGNOSIS — D509 Iron deficiency anemia, unspecified: Secondary | ICD-10-CM | POA: Diagnosis not present

## 2015-11-02 DIAGNOSIS — E119 Type 2 diabetes mellitus without complications: Secondary | ICD-10-CM | POA: Diagnosis not present

## 2015-11-02 DIAGNOSIS — N186 End stage renal disease: Secondary | ICD-10-CM | POA: Diagnosis not present

## 2015-11-04 DIAGNOSIS — Z23 Encounter for immunization: Secondary | ICD-10-CM | POA: Diagnosis not present

## 2015-11-04 DIAGNOSIS — E119 Type 2 diabetes mellitus without complications: Secondary | ICD-10-CM | POA: Diagnosis not present

## 2015-11-04 DIAGNOSIS — D509 Iron deficiency anemia, unspecified: Secondary | ICD-10-CM | POA: Diagnosis not present

## 2015-11-04 DIAGNOSIS — N2581 Secondary hyperparathyroidism of renal origin: Secondary | ICD-10-CM | POA: Diagnosis not present

## 2015-11-04 DIAGNOSIS — N186 End stage renal disease: Secondary | ICD-10-CM | POA: Diagnosis not present

## 2015-11-07 DIAGNOSIS — N186 End stage renal disease: Secondary | ICD-10-CM | POA: Diagnosis not present

## 2015-11-07 DIAGNOSIS — E119 Type 2 diabetes mellitus without complications: Secondary | ICD-10-CM | POA: Diagnosis not present

## 2015-11-07 DIAGNOSIS — N2581 Secondary hyperparathyroidism of renal origin: Secondary | ICD-10-CM | POA: Diagnosis not present

## 2015-11-07 DIAGNOSIS — D509 Iron deficiency anemia, unspecified: Secondary | ICD-10-CM | POA: Diagnosis not present

## 2015-11-07 DIAGNOSIS — Z23 Encounter for immunization: Secondary | ICD-10-CM | POA: Diagnosis not present

## 2015-11-09 DIAGNOSIS — D509 Iron deficiency anemia, unspecified: Secondary | ICD-10-CM | POA: Diagnosis not present

## 2015-11-09 DIAGNOSIS — Z23 Encounter for immunization: Secondary | ICD-10-CM | POA: Diagnosis not present

## 2015-11-09 DIAGNOSIS — E1129 Type 2 diabetes mellitus with other diabetic kidney complication: Secondary | ICD-10-CM | POA: Diagnosis not present

## 2015-11-09 DIAGNOSIS — E119 Type 2 diabetes mellitus without complications: Secondary | ICD-10-CM | POA: Diagnosis not present

## 2015-11-09 DIAGNOSIS — N186 End stage renal disease: Secondary | ICD-10-CM | POA: Diagnosis not present

## 2015-11-09 DIAGNOSIS — N2581 Secondary hyperparathyroidism of renal origin: Secondary | ICD-10-CM | POA: Diagnosis not present

## 2015-11-11 DIAGNOSIS — D509 Iron deficiency anemia, unspecified: Secondary | ICD-10-CM | POA: Diagnosis not present

## 2015-11-11 DIAGNOSIS — E119 Type 2 diabetes mellitus without complications: Secondary | ICD-10-CM | POA: Diagnosis not present

## 2015-11-11 DIAGNOSIS — Z23 Encounter for immunization: Secondary | ICD-10-CM | POA: Diagnosis not present

## 2015-11-11 DIAGNOSIS — N2581 Secondary hyperparathyroidism of renal origin: Secondary | ICD-10-CM | POA: Diagnosis not present

## 2015-11-11 DIAGNOSIS — N186 End stage renal disease: Secondary | ICD-10-CM | POA: Diagnosis not present

## 2015-11-14 DIAGNOSIS — Z23 Encounter for immunization: Secondary | ICD-10-CM | POA: Diagnosis not present

## 2015-11-14 DIAGNOSIS — E119 Type 2 diabetes mellitus without complications: Secondary | ICD-10-CM | POA: Diagnosis not present

## 2015-11-14 DIAGNOSIS — D509 Iron deficiency anemia, unspecified: Secondary | ICD-10-CM | POA: Diagnosis not present

## 2015-11-14 DIAGNOSIS — N186 End stage renal disease: Secondary | ICD-10-CM | POA: Diagnosis not present

## 2015-11-14 DIAGNOSIS — N2581 Secondary hyperparathyroidism of renal origin: Secondary | ICD-10-CM | POA: Diagnosis not present

## 2015-11-16 DIAGNOSIS — D509 Iron deficiency anemia, unspecified: Secondary | ICD-10-CM | POA: Diagnosis not present

## 2015-11-16 DIAGNOSIS — N186 End stage renal disease: Secondary | ICD-10-CM | POA: Diagnosis not present

## 2015-11-16 DIAGNOSIS — N2581 Secondary hyperparathyroidism of renal origin: Secondary | ICD-10-CM | POA: Diagnosis not present

## 2015-11-16 DIAGNOSIS — E119 Type 2 diabetes mellitus without complications: Secondary | ICD-10-CM | POA: Diagnosis not present

## 2015-11-16 DIAGNOSIS — Z23 Encounter for immunization: Secondary | ICD-10-CM | POA: Diagnosis not present

## 2015-11-18 DIAGNOSIS — D509 Iron deficiency anemia, unspecified: Secondary | ICD-10-CM | POA: Diagnosis not present

## 2015-11-18 DIAGNOSIS — N186 End stage renal disease: Secondary | ICD-10-CM | POA: Diagnosis not present

## 2015-11-18 DIAGNOSIS — Z23 Encounter for immunization: Secondary | ICD-10-CM | POA: Diagnosis not present

## 2015-11-18 DIAGNOSIS — E119 Type 2 diabetes mellitus without complications: Secondary | ICD-10-CM | POA: Diagnosis not present

## 2015-11-18 DIAGNOSIS — N2581 Secondary hyperparathyroidism of renal origin: Secondary | ICD-10-CM | POA: Diagnosis not present

## 2015-11-21 DIAGNOSIS — Z23 Encounter for immunization: Secondary | ICD-10-CM | POA: Diagnosis not present

## 2015-11-21 DIAGNOSIS — N186 End stage renal disease: Secondary | ICD-10-CM | POA: Diagnosis not present

## 2015-11-21 DIAGNOSIS — N2581 Secondary hyperparathyroidism of renal origin: Secondary | ICD-10-CM | POA: Diagnosis not present

## 2015-11-21 DIAGNOSIS — E119 Type 2 diabetes mellitus without complications: Secondary | ICD-10-CM | POA: Diagnosis not present

## 2015-11-21 DIAGNOSIS — D509 Iron deficiency anemia, unspecified: Secondary | ICD-10-CM | POA: Diagnosis not present

## 2015-11-23 DIAGNOSIS — Z23 Encounter for immunization: Secondary | ICD-10-CM | POA: Diagnosis not present

## 2015-11-23 DIAGNOSIS — N2581 Secondary hyperparathyroidism of renal origin: Secondary | ICD-10-CM | POA: Diagnosis not present

## 2015-11-23 DIAGNOSIS — E119 Type 2 diabetes mellitus without complications: Secondary | ICD-10-CM | POA: Diagnosis not present

## 2015-11-23 DIAGNOSIS — N186 End stage renal disease: Secondary | ICD-10-CM | POA: Diagnosis not present

## 2015-11-23 DIAGNOSIS — D509 Iron deficiency anemia, unspecified: Secondary | ICD-10-CM | POA: Diagnosis not present

## 2015-11-25 DIAGNOSIS — N186 End stage renal disease: Secondary | ICD-10-CM | POA: Diagnosis not present

## 2015-11-25 DIAGNOSIS — N2581 Secondary hyperparathyroidism of renal origin: Secondary | ICD-10-CM | POA: Diagnosis not present

## 2015-11-25 DIAGNOSIS — D509 Iron deficiency anemia, unspecified: Secondary | ICD-10-CM | POA: Diagnosis not present

## 2015-11-25 DIAGNOSIS — Z23 Encounter for immunization: Secondary | ICD-10-CM | POA: Diagnosis not present

## 2015-11-25 DIAGNOSIS — E119 Type 2 diabetes mellitus without complications: Secondary | ICD-10-CM | POA: Diagnosis not present

## 2015-11-28 DIAGNOSIS — N032 Chronic nephritic syndrome with diffuse membranous glomerulonephritis: Secondary | ICD-10-CM | POA: Diagnosis not present

## 2015-11-28 DIAGNOSIS — D509 Iron deficiency anemia, unspecified: Secondary | ICD-10-CM | POA: Diagnosis not present

## 2015-11-28 DIAGNOSIS — N2581 Secondary hyperparathyroidism of renal origin: Secondary | ICD-10-CM | POA: Diagnosis not present

## 2015-11-28 DIAGNOSIS — Z992 Dependence on renal dialysis: Secondary | ICD-10-CM | POA: Diagnosis not present

## 2015-11-28 DIAGNOSIS — E119 Type 2 diabetes mellitus without complications: Secondary | ICD-10-CM | POA: Diagnosis not present

## 2015-11-28 DIAGNOSIS — Z23 Encounter for immunization: Secondary | ICD-10-CM | POA: Diagnosis not present

## 2015-11-28 DIAGNOSIS — N186 End stage renal disease: Secondary | ICD-10-CM | POA: Diagnosis not present

## 2015-11-30 DIAGNOSIS — D509 Iron deficiency anemia, unspecified: Secondary | ICD-10-CM | POA: Diagnosis not present

## 2015-11-30 DIAGNOSIS — N186 End stage renal disease: Secondary | ICD-10-CM | POA: Diagnosis not present

## 2015-11-30 DIAGNOSIS — E119 Type 2 diabetes mellitus without complications: Secondary | ICD-10-CM | POA: Diagnosis not present

## 2015-11-30 DIAGNOSIS — N2581 Secondary hyperparathyroidism of renal origin: Secondary | ICD-10-CM | POA: Diagnosis not present

## 2015-12-02 DIAGNOSIS — E119 Type 2 diabetes mellitus without complications: Secondary | ICD-10-CM | POA: Diagnosis not present

## 2015-12-02 DIAGNOSIS — N186 End stage renal disease: Secondary | ICD-10-CM | POA: Diagnosis not present

## 2015-12-02 DIAGNOSIS — N2581 Secondary hyperparathyroidism of renal origin: Secondary | ICD-10-CM | POA: Diagnosis not present

## 2015-12-02 DIAGNOSIS — D509 Iron deficiency anemia, unspecified: Secondary | ICD-10-CM | POA: Diagnosis not present

## 2015-12-04 DIAGNOSIS — F250 Schizoaffective disorder, bipolar type: Secondary | ICD-10-CM | POA: Diagnosis not present

## 2015-12-05 DIAGNOSIS — D509 Iron deficiency anemia, unspecified: Secondary | ICD-10-CM | POA: Diagnosis not present

## 2015-12-05 DIAGNOSIS — E119 Type 2 diabetes mellitus without complications: Secondary | ICD-10-CM | POA: Diagnosis not present

## 2015-12-05 DIAGNOSIS — N2581 Secondary hyperparathyroidism of renal origin: Secondary | ICD-10-CM | POA: Diagnosis not present

## 2015-12-05 DIAGNOSIS — N186 End stage renal disease: Secondary | ICD-10-CM | POA: Diagnosis not present

## 2015-12-07 DIAGNOSIS — E119 Type 2 diabetes mellitus without complications: Secondary | ICD-10-CM | POA: Diagnosis not present

## 2015-12-07 DIAGNOSIS — D509 Iron deficiency anemia, unspecified: Secondary | ICD-10-CM | POA: Diagnosis not present

## 2015-12-07 DIAGNOSIS — N2581 Secondary hyperparathyroidism of renal origin: Secondary | ICD-10-CM | POA: Diagnosis not present

## 2015-12-07 DIAGNOSIS — N186 End stage renal disease: Secondary | ICD-10-CM | POA: Diagnosis not present

## 2015-12-09 DIAGNOSIS — N186 End stage renal disease: Secondary | ICD-10-CM | POA: Diagnosis not present

## 2015-12-09 DIAGNOSIS — N2581 Secondary hyperparathyroidism of renal origin: Secondary | ICD-10-CM | POA: Diagnosis not present

## 2015-12-09 DIAGNOSIS — D509 Iron deficiency anemia, unspecified: Secondary | ICD-10-CM | POA: Diagnosis not present

## 2015-12-09 DIAGNOSIS — E119 Type 2 diabetes mellitus without complications: Secondary | ICD-10-CM | POA: Diagnosis not present

## 2015-12-12 DIAGNOSIS — D509 Iron deficiency anemia, unspecified: Secondary | ICD-10-CM | POA: Diagnosis not present

## 2015-12-12 DIAGNOSIS — E119 Type 2 diabetes mellitus without complications: Secondary | ICD-10-CM | POA: Diagnosis not present

## 2015-12-12 DIAGNOSIS — N2581 Secondary hyperparathyroidism of renal origin: Secondary | ICD-10-CM | POA: Diagnosis not present

## 2015-12-12 DIAGNOSIS — N186 End stage renal disease: Secondary | ICD-10-CM | POA: Diagnosis not present

## 2015-12-14 DIAGNOSIS — N186 End stage renal disease: Secondary | ICD-10-CM | POA: Diagnosis not present

## 2015-12-14 DIAGNOSIS — E119 Type 2 diabetes mellitus without complications: Secondary | ICD-10-CM | POA: Diagnosis not present

## 2015-12-14 DIAGNOSIS — D509 Iron deficiency anemia, unspecified: Secondary | ICD-10-CM | POA: Diagnosis not present

## 2015-12-14 DIAGNOSIS — N2581 Secondary hyperparathyroidism of renal origin: Secondary | ICD-10-CM | POA: Diagnosis not present

## 2015-12-16 DIAGNOSIS — N2581 Secondary hyperparathyroidism of renal origin: Secondary | ICD-10-CM | POA: Diagnosis not present

## 2015-12-16 DIAGNOSIS — D509 Iron deficiency anemia, unspecified: Secondary | ICD-10-CM | POA: Diagnosis not present

## 2015-12-16 DIAGNOSIS — N186 End stage renal disease: Secondary | ICD-10-CM | POA: Diagnosis not present

## 2015-12-16 DIAGNOSIS — E119 Type 2 diabetes mellitus without complications: Secondary | ICD-10-CM | POA: Diagnosis not present

## 2015-12-19 DIAGNOSIS — N2581 Secondary hyperparathyroidism of renal origin: Secondary | ICD-10-CM | POA: Diagnosis not present

## 2015-12-19 DIAGNOSIS — N186 End stage renal disease: Secondary | ICD-10-CM | POA: Diagnosis not present

## 2015-12-19 DIAGNOSIS — E119 Type 2 diabetes mellitus without complications: Secondary | ICD-10-CM | POA: Diagnosis not present

## 2015-12-19 DIAGNOSIS — D509 Iron deficiency anemia, unspecified: Secondary | ICD-10-CM | POA: Diagnosis not present

## 2015-12-21 DIAGNOSIS — N186 End stage renal disease: Secondary | ICD-10-CM | POA: Diagnosis not present

## 2015-12-21 DIAGNOSIS — N2581 Secondary hyperparathyroidism of renal origin: Secondary | ICD-10-CM | POA: Diagnosis not present

## 2015-12-21 DIAGNOSIS — D509 Iron deficiency anemia, unspecified: Secondary | ICD-10-CM | POA: Diagnosis not present

## 2015-12-21 DIAGNOSIS — E119 Type 2 diabetes mellitus without complications: Secondary | ICD-10-CM | POA: Diagnosis not present

## 2015-12-23 DIAGNOSIS — N2581 Secondary hyperparathyroidism of renal origin: Secondary | ICD-10-CM | POA: Diagnosis not present

## 2015-12-23 DIAGNOSIS — E119 Type 2 diabetes mellitus without complications: Secondary | ICD-10-CM | POA: Diagnosis not present

## 2015-12-23 DIAGNOSIS — D509 Iron deficiency anemia, unspecified: Secondary | ICD-10-CM | POA: Diagnosis not present

## 2015-12-23 DIAGNOSIS — N186 End stage renal disease: Secondary | ICD-10-CM | POA: Diagnosis not present

## 2015-12-26 DIAGNOSIS — E119 Type 2 diabetes mellitus without complications: Secondary | ICD-10-CM | POA: Diagnosis not present

## 2015-12-26 DIAGNOSIS — N032 Chronic nephritic syndrome with diffuse membranous glomerulonephritis: Secondary | ICD-10-CM | POA: Diagnosis not present

## 2015-12-26 DIAGNOSIS — Z992 Dependence on renal dialysis: Secondary | ICD-10-CM | POA: Diagnosis not present

## 2015-12-26 DIAGNOSIS — N2581 Secondary hyperparathyroidism of renal origin: Secondary | ICD-10-CM | POA: Diagnosis not present

## 2015-12-26 DIAGNOSIS — N186 End stage renal disease: Secondary | ICD-10-CM | POA: Diagnosis not present

## 2015-12-26 DIAGNOSIS — D509 Iron deficiency anemia, unspecified: Secondary | ICD-10-CM | POA: Diagnosis not present

## 2015-12-28 DIAGNOSIS — R509 Fever, unspecified: Secondary | ICD-10-CM | POA: Diagnosis not present

## 2015-12-28 DIAGNOSIS — N186 End stage renal disease: Secondary | ICD-10-CM | POA: Diagnosis not present

## 2015-12-28 DIAGNOSIS — N2581 Secondary hyperparathyroidism of renal origin: Secondary | ICD-10-CM | POA: Diagnosis not present

## 2015-12-28 DIAGNOSIS — E119 Type 2 diabetes mellitus without complications: Secondary | ICD-10-CM | POA: Diagnosis not present

## 2015-12-28 DIAGNOSIS — D509 Iron deficiency anemia, unspecified: Secondary | ICD-10-CM | POA: Diagnosis not present

## 2015-12-30 DIAGNOSIS — D509 Iron deficiency anemia, unspecified: Secondary | ICD-10-CM | POA: Diagnosis not present

## 2015-12-30 DIAGNOSIS — N186 End stage renal disease: Secondary | ICD-10-CM | POA: Diagnosis not present

## 2015-12-30 DIAGNOSIS — E119 Type 2 diabetes mellitus without complications: Secondary | ICD-10-CM | POA: Diagnosis not present

## 2015-12-30 DIAGNOSIS — N2581 Secondary hyperparathyroidism of renal origin: Secondary | ICD-10-CM | POA: Diagnosis not present

## 2015-12-30 DIAGNOSIS — R509 Fever, unspecified: Secondary | ICD-10-CM | POA: Diagnosis not present

## 2016-01-02 DIAGNOSIS — E119 Type 2 diabetes mellitus without complications: Secondary | ICD-10-CM | POA: Diagnosis not present

## 2016-01-02 DIAGNOSIS — R509 Fever, unspecified: Secondary | ICD-10-CM | POA: Diagnosis not present

## 2016-01-02 DIAGNOSIS — N2581 Secondary hyperparathyroidism of renal origin: Secondary | ICD-10-CM | POA: Diagnosis not present

## 2016-01-02 DIAGNOSIS — D509 Iron deficiency anemia, unspecified: Secondary | ICD-10-CM | POA: Diagnosis not present

## 2016-01-02 DIAGNOSIS — N186 End stage renal disease: Secondary | ICD-10-CM | POA: Diagnosis not present

## 2016-01-04 DIAGNOSIS — D509 Iron deficiency anemia, unspecified: Secondary | ICD-10-CM | POA: Diagnosis not present

## 2016-01-04 DIAGNOSIS — N186 End stage renal disease: Secondary | ICD-10-CM | POA: Diagnosis not present

## 2016-01-04 DIAGNOSIS — R509 Fever, unspecified: Secondary | ICD-10-CM | POA: Diagnosis not present

## 2016-01-04 DIAGNOSIS — E119 Type 2 diabetes mellitus without complications: Secondary | ICD-10-CM | POA: Diagnosis not present

## 2016-01-04 DIAGNOSIS — N2581 Secondary hyperparathyroidism of renal origin: Secondary | ICD-10-CM | POA: Diagnosis not present

## 2016-01-06 DIAGNOSIS — E119 Type 2 diabetes mellitus without complications: Secondary | ICD-10-CM | POA: Diagnosis not present

## 2016-01-06 DIAGNOSIS — N186 End stage renal disease: Secondary | ICD-10-CM | POA: Diagnosis not present

## 2016-01-06 DIAGNOSIS — R509 Fever, unspecified: Secondary | ICD-10-CM | POA: Diagnosis not present

## 2016-01-06 DIAGNOSIS — N2581 Secondary hyperparathyroidism of renal origin: Secondary | ICD-10-CM | POA: Diagnosis not present

## 2016-01-06 DIAGNOSIS — D509 Iron deficiency anemia, unspecified: Secondary | ICD-10-CM | POA: Diagnosis not present

## 2016-01-09 DIAGNOSIS — N2581 Secondary hyperparathyroidism of renal origin: Secondary | ICD-10-CM | POA: Diagnosis not present

## 2016-01-09 DIAGNOSIS — R509 Fever, unspecified: Secondary | ICD-10-CM | POA: Diagnosis not present

## 2016-01-09 DIAGNOSIS — N186 End stage renal disease: Secondary | ICD-10-CM | POA: Diagnosis not present

## 2016-01-09 DIAGNOSIS — E119 Type 2 diabetes mellitus without complications: Secondary | ICD-10-CM | POA: Diagnosis not present

## 2016-01-09 DIAGNOSIS — D509 Iron deficiency anemia, unspecified: Secondary | ICD-10-CM | POA: Diagnosis not present

## 2016-01-11 DIAGNOSIS — N186 End stage renal disease: Secondary | ICD-10-CM | POA: Diagnosis not present

## 2016-01-11 DIAGNOSIS — E119 Type 2 diabetes mellitus without complications: Secondary | ICD-10-CM | POA: Diagnosis not present

## 2016-01-11 DIAGNOSIS — R509 Fever, unspecified: Secondary | ICD-10-CM | POA: Diagnosis not present

## 2016-01-11 DIAGNOSIS — D509 Iron deficiency anemia, unspecified: Secondary | ICD-10-CM | POA: Diagnosis not present

## 2016-01-11 DIAGNOSIS — N2581 Secondary hyperparathyroidism of renal origin: Secondary | ICD-10-CM | POA: Diagnosis not present

## 2016-01-13 DIAGNOSIS — E119 Type 2 diabetes mellitus without complications: Secondary | ICD-10-CM | POA: Diagnosis not present

## 2016-01-13 DIAGNOSIS — D509 Iron deficiency anemia, unspecified: Secondary | ICD-10-CM | POA: Diagnosis not present

## 2016-01-13 DIAGNOSIS — N186 End stage renal disease: Secondary | ICD-10-CM | POA: Diagnosis not present

## 2016-01-13 DIAGNOSIS — N2581 Secondary hyperparathyroidism of renal origin: Secondary | ICD-10-CM | POA: Diagnosis not present

## 2016-01-13 DIAGNOSIS — R509 Fever, unspecified: Secondary | ICD-10-CM | POA: Diagnosis not present

## 2016-01-16 DIAGNOSIS — D509 Iron deficiency anemia, unspecified: Secondary | ICD-10-CM | POA: Diagnosis not present

## 2016-01-16 DIAGNOSIS — N186 End stage renal disease: Secondary | ICD-10-CM | POA: Diagnosis not present

## 2016-01-16 DIAGNOSIS — N2581 Secondary hyperparathyroidism of renal origin: Secondary | ICD-10-CM | POA: Diagnosis not present

## 2016-01-16 DIAGNOSIS — R509 Fever, unspecified: Secondary | ICD-10-CM | POA: Diagnosis not present

## 2016-01-16 DIAGNOSIS — E119 Type 2 diabetes mellitus without complications: Secondary | ICD-10-CM | POA: Diagnosis not present

## 2016-01-18 DIAGNOSIS — E119 Type 2 diabetes mellitus without complications: Secondary | ICD-10-CM | POA: Diagnosis not present

## 2016-01-18 DIAGNOSIS — R509 Fever, unspecified: Secondary | ICD-10-CM | POA: Diagnosis not present

## 2016-01-18 DIAGNOSIS — N186 End stage renal disease: Secondary | ICD-10-CM | POA: Diagnosis not present

## 2016-01-18 DIAGNOSIS — D509 Iron deficiency anemia, unspecified: Secondary | ICD-10-CM | POA: Diagnosis not present

## 2016-01-18 DIAGNOSIS — N2581 Secondary hyperparathyroidism of renal origin: Secondary | ICD-10-CM | POA: Diagnosis not present

## 2016-01-20 DIAGNOSIS — D509 Iron deficiency anemia, unspecified: Secondary | ICD-10-CM | POA: Diagnosis not present

## 2016-01-20 DIAGNOSIS — N186 End stage renal disease: Secondary | ICD-10-CM | POA: Diagnosis not present

## 2016-01-20 DIAGNOSIS — N2581 Secondary hyperparathyroidism of renal origin: Secondary | ICD-10-CM | POA: Diagnosis not present

## 2016-01-20 DIAGNOSIS — R509 Fever, unspecified: Secondary | ICD-10-CM | POA: Diagnosis not present

## 2016-01-20 DIAGNOSIS — E119 Type 2 diabetes mellitus without complications: Secondary | ICD-10-CM | POA: Diagnosis not present

## 2016-01-23 DIAGNOSIS — N186 End stage renal disease: Secondary | ICD-10-CM | POA: Diagnosis not present

## 2016-01-23 DIAGNOSIS — E119 Type 2 diabetes mellitus without complications: Secondary | ICD-10-CM | POA: Diagnosis not present

## 2016-01-23 DIAGNOSIS — N2581 Secondary hyperparathyroidism of renal origin: Secondary | ICD-10-CM | POA: Diagnosis not present

## 2016-01-23 DIAGNOSIS — D509 Iron deficiency anemia, unspecified: Secondary | ICD-10-CM | POA: Diagnosis not present

## 2016-01-23 DIAGNOSIS — R509 Fever, unspecified: Secondary | ICD-10-CM | POA: Diagnosis not present

## 2016-01-25 DIAGNOSIS — R509 Fever, unspecified: Secondary | ICD-10-CM | POA: Diagnosis not present

## 2016-01-25 DIAGNOSIS — E119 Type 2 diabetes mellitus without complications: Secondary | ICD-10-CM | POA: Diagnosis not present

## 2016-01-25 DIAGNOSIS — N186 End stage renal disease: Secondary | ICD-10-CM | POA: Diagnosis not present

## 2016-01-25 DIAGNOSIS — N2581 Secondary hyperparathyroidism of renal origin: Secondary | ICD-10-CM | POA: Diagnosis not present

## 2016-01-25 DIAGNOSIS — D509 Iron deficiency anemia, unspecified: Secondary | ICD-10-CM | POA: Diagnosis not present

## 2016-01-26 ENCOUNTER — Ambulatory Visit (INDEPENDENT_AMBULATORY_CARE_PROVIDER_SITE_OTHER): Payer: Medicare Other | Admitting: Internal Medicine

## 2016-01-26 ENCOUNTER — Encounter: Payer: Self-pay | Admitting: Internal Medicine

## 2016-01-26 VITALS — BP 126/82 | HR 82 | Ht 68.0 in | Wt 181.0 lb

## 2016-01-26 DIAGNOSIS — N032 Chronic nephritic syndrome with diffuse membranous glomerulonephritis: Secondary | ICD-10-CM | POA: Diagnosis not present

## 2016-01-26 DIAGNOSIS — Z992 Dependence on renal dialysis: Secondary | ICD-10-CM | POA: Diagnosis not present

## 2016-01-26 DIAGNOSIS — I4901 Ventricular fibrillation: Secondary | ICD-10-CM | POA: Diagnosis not present

## 2016-01-26 DIAGNOSIS — N186 End stage renal disease: Secondary | ICD-10-CM | POA: Diagnosis not present

## 2016-01-26 LAB — CUP PACEART INCLINIC DEVICE CHECK
Battery Voltage: 2.86 V
Brady Statistic RV Percent Paced: 0 %
Date Time Interrogation Session: 20170331141441
HighPow Impedance: 54 Ohm
HighPow Impedance: 73 Ohm
Implantable Lead Implant Date: 20100415
Implantable Lead Location: 753860
Implantable Lead Model: 6947
Lead Channel Impedance Value: 494 Ohm
Lead Channel Pacing Threshold Amplitude: 1.75 V
Lead Channel Pacing Threshold Pulse Width: 0.8 ms
Lead Channel Sensing Intrinsic Amplitude: 4 mV
Lead Channel Setting Pacing Amplitude: 2.5 V
Lead Channel Setting Pacing Pulse Width: 0.8 ms
Lead Channel Setting Sensing Sensitivity: 0.3 mV

## 2016-01-26 NOTE — Patient Instructions (Signed)
Medication Instructions:  Your physician recommends that you continue on your current medications as directed. Please refer to the Current Medication list given to you today.   Labwork: None ordered   Testing/Procedures: None ordered   Follow-Up: Your physician wants you to follow-up in: 12 months with Dr Knox Saliva will receive a reminder letter in the mail two months in advance. If you don't receive a letter, please call our office to schedule the follow-up appointment.   Remote monitoring is used to monitor your  ICD from home. This monitoring reduces the number of office visits required to check your device to one time per year. It allows [place] to keep an eye on the functioning of your device to ensure it is working properly. You are scheduled for a device check from home on 04/29/16. You may send your transmission at any time that day. If you have a wireless device, the transmission will be sent automatically. After your physician reviews your transmission, you will receive a postcard with your next transmission date.    Any Other Special Instructions Will Be Listed Below (If Applicable).     If you need a refill on your cardiac medications before your next appointment, please call your pharmacy.

## 2016-01-26 NOTE — Progress Notes (Signed)
HPI James Nicholson returns today for for ongoing evaluation and management of his ICD. The patient is a very pleasant 50 year old man with multiple medical problems. He had a resuscitated ventricular fibrillation arrest several years ago and underwent ICD implantation. He has glomerulonephritis which has progressed to ESRD and HD. He has schizophrenia which appears to be quiet. He denies any recent ICD shocks. He has class II heart failure symptoms. He admits to a sedentary lifestyle. His history is provided by the interpreter over the phone.  No Known Allergies   Current Outpatient Prescriptions  Medication Sig Dispense Refill  . calcium acetate (PHOSLO) 667 MG capsule Take 1,334 mg by mouth 3 (three) times daily with meals. 8a, 12p, 5p    . cetirizine-pseudoephedrine (ZYRTEC-D) 5-120 MG tablet Take 1 tablet by mouth 2 (two) times daily.    Marland Kitchen gabapentin (NEURONTIN) 300 MG capsule Take 300 mg by mouth 2 (two) times daily.    Marland Kitchen gemfibrozil (LOPID) 600 MG tablet Take 600 mg by mouth 2 (two) times daily before a meal.    . insulin detemir (LEVEMIR) 100 UNIT/ML injection Inject 0.05 mLs (5 Units total) into the skin at bedtime. 10 mL 0  . metoprolol succinate (TOPROL-XL) 50 MG 24 hr tablet Take 50 mg by mouth 2 (two) times daily. Take with or immediately following a meal.    . omeprazole (PRILOSEC) 20 MG capsule Take 20 mg by mouth every morning.     . risperiDONE (RISPERDAL) 0.25 MG tablet Take 0.25 mg by mouth at bedtime.    . traZODone (DESYREL) 100 MG tablet Take 100 mg by mouth at bedtime.     No current facility-administered medications for this visit.     Past Medical History  Diagnosis Date  . Hypertension   . Dyslipidemia   . Anemia   . Cellulitis   . Atrial fibrillation   . Enterobacter sepsis ([place])   . Schizophrenia ([place])   . Chronic kidney disease   . Cardiomyopathy ([place]) 2010    Unclear Etiology: Last Echo 06/2009: EF 40-45%, severe Lateral & apical Hypokinesis (?  CAD) ; Grade 2 DDysfxn. Mild conc LVH.   . S/P ICD (internal cardiac defibrillator) procedure 2010    VT Arrest (in [place])  . Hyperlipidemia     ROS:   All systems reviewed and negative except as noted in the HPI.   Past Surgical History  Procedure Laterality Date  . Cardiac defibrillator placement  2010    [place]  . Av fistula placement Right 02/10/2013    Procedure: ARTERIOVENOUS (AV) FISTULA CREATION;  Surgeon: Rosetta Posner, MD;  Location: [facility] OR;  Service: Vascular;  Laterality: Right;  Right forearm radial/cephalic arterovenous fistula.   . Ligation of competing branches of arteriovenous fistula Right 08/13/2013    Procedure: LIGATION OF COMPETING BRANCHES X5 OF ARTERIOVENOUS FISTULA- RIGHT ARM;  Surgeon: Serafina Mitchell, MD;  Location: [place];  Service: Vascular;  Laterality: Right;  . Cardiac catheterization  2010    [place]: In setting of VT arrest. Per brother's report, nonobstructive with no intervention  . Fistulogram Right 08/09/2013    Procedure: FISTULOGRAM;  Surgeon: Angelia Mould, MD;  Location: [facility] CATH LAB;  Service: Cardiovascular;  Laterality: Right;     Family History  Problem Relation Age of Onset  . CAD Mother      Social History   Social History  . Marital Status: Single    Spouse Name: N/A  . Number of  Children: N/A  . Years of Education: N/A   Occupational History  . Not on file.   Social History Main Topics  . Smoking status: Former Smoker    Quit date: 10/28/2008  . Smokeless tobacco: Never Used  . Alcohol Use: No  . Drug Use: No  . Sexual Activity: Not on file   Other Topics Concern  . Not on file   Social History Narrative     BP 126/82 mmHg  Pulse 82  Ht 5\' 8"  (1.727 m)  Wt 181 lb (82.101 kg)  BMI 27.53 kg/m2  Physical Exam:  Well appearing 50 yo man, NAD HEENT: Unremarkable Neck:  No JVD, no thyromegally Back:  No CVA tenderness Lungs:  Clear with no wheezes HEART:  Regular rate rhythm, no murmurs, no rubs,  no clicks Abd:  soft, positive bowel sounds, no organomegally, no rebound, no guarding Ext:  2 plus pulses, no edema, no cyanosis, no clubbing, right arm with fistula Skin:  No rashes no nodules Neuro:  CN II through XII intact, motor grossly intact  ECG - NSR with RBBB  DEVICE  Normal device function.  See PaceArt for details.   Assess/Plan: 1. VF - he has had no recurrent ICD shocks. He does have NSVT which is asymptomtic. 2. HTN - his blood pressure is well controlled today. No change in his meds. 3. ICD - His medtronic ICD is working normally. Will follow.  James Nicholson.D.

## 2016-01-27 DIAGNOSIS — D509 Iron deficiency anemia, unspecified: Secondary | ICD-10-CM | POA: Diagnosis not present

## 2016-01-27 DIAGNOSIS — N2581 Secondary hyperparathyroidism of renal origin: Secondary | ICD-10-CM | POA: Diagnosis not present

## 2016-01-27 DIAGNOSIS — E119 Type 2 diabetes mellitus without complications: Secondary | ICD-10-CM | POA: Diagnosis not present

## 2016-01-27 DIAGNOSIS — N186 End stage renal disease: Secondary | ICD-10-CM | POA: Diagnosis not present

## 2016-01-30 DIAGNOSIS — N2581 Secondary hyperparathyroidism of renal origin: Secondary | ICD-10-CM | POA: Diagnosis not present

## 2016-01-30 DIAGNOSIS — D509 Iron deficiency anemia, unspecified: Secondary | ICD-10-CM | POA: Diagnosis not present

## 2016-01-30 DIAGNOSIS — E119 Type 2 diabetes mellitus without complications: Secondary | ICD-10-CM | POA: Diagnosis not present

## 2016-01-30 DIAGNOSIS — N186 End stage renal disease: Secondary | ICD-10-CM | POA: Diagnosis not present

## 2016-02-01 DIAGNOSIS — E119 Type 2 diabetes mellitus without complications: Secondary | ICD-10-CM | POA: Diagnosis not present

## 2016-02-01 DIAGNOSIS — D509 Iron deficiency anemia, unspecified: Secondary | ICD-10-CM | POA: Diagnosis not present

## 2016-02-01 DIAGNOSIS — N186 End stage renal disease: Secondary | ICD-10-CM | POA: Diagnosis not present

## 2016-02-01 DIAGNOSIS — N2581 Secondary hyperparathyroidism of renal origin: Secondary | ICD-10-CM | POA: Diagnosis not present

## 2016-02-03 DIAGNOSIS — N186 End stage renal disease: Secondary | ICD-10-CM | POA: Diagnosis not present

## 2016-02-03 DIAGNOSIS — N2581 Secondary hyperparathyroidism of renal origin: Secondary | ICD-10-CM | POA: Diagnosis not present

## 2016-02-03 DIAGNOSIS — D509 Iron deficiency anemia, unspecified: Secondary | ICD-10-CM | POA: Diagnosis not present

## 2016-02-03 DIAGNOSIS — E119 Type 2 diabetes mellitus without complications: Secondary | ICD-10-CM | POA: Diagnosis not present

## 2016-02-06 DIAGNOSIS — N186 End stage renal disease: Secondary | ICD-10-CM | POA: Diagnosis not present

## 2016-02-06 DIAGNOSIS — N2581 Secondary hyperparathyroidism of renal origin: Secondary | ICD-10-CM | POA: Diagnosis not present

## 2016-02-06 DIAGNOSIS — E119 Type 2 diabetes mellitus without complications: Secondary | ICD-10-CM | POA: Diagnosis not present

## 2016-02-06 DIAGNOSIS — D509 Iron deficiency anemia, unspecified: Secondary | ICD-10-CM | POA: Diagnosis not present

## 2016-02-08 DIAGNOSIS — D509 Iron deficiency anemia, unspecified: Secondary | ICD-10-CM | POA: Diagnosis not present

## 2016-02-08 DIAGNOSIS — E119 Type 2 diabetes mellitus without complications: Secondary | ICD-10-CM | POA: Diagnosis not present

## 2016-02-08 DIAGNOSIS — E1129 Type 2 diabetes mellitus with other diabetic kidney complication: Secondary | ICD-10-CM | POA: Diagnosis not present

## 2016-02-08 DIAGNOSIS — N186 End stage renal disease: Secondary | ICD-10-CM | POA: Diagnosis not present

## 2016-02-08 DIAGNOSIS — N2581 Secondary hyperparathyroidism of renal origin: Secondary | ICD-10-CM | POA: Diagnosis not present

## 2016-02-10 DIAGNOSIS — E119 Type 2 diabetes mellitus without complications: Secondary | ICD-10-CM | POA: Diagnosis not present

## 2016-02-10 DIAGNOSIS — N2581 Secondary hyperparathyroidism of renal origin: Secondary | ICD-10-CM | POA: Diagnosis not present

## 2016-02-10 DIAGNOSIS — N186 End stage renal disease: Secondary | ICD-10-CM | POA: Diagnosis not present

## 2016-02-10 DIAGNOSIS — D509 Iron deficiency anemia, unspecified: Secondary | ICD-10-CM | POA: Diagnosis not present

## 2016-02-13 DIAGNOSIS — N2581 Secondary hyperparathyroidism of renal origin: Secondary | ICD-10-CM | POA: Diagnosis not present

## 2016-02-13 DIAGNOSIS — N186 End stage renal disease: Secondary | ICD-10-CM | POA: Diagnosis not present

## 2016-02-13 DIAGNOSIS — D509 Iron deficiency anemia, unspecified: Secondary | ICD-10-CM | POA: Diagnosis not present

## 2016-02-13 DIAGNOSIS — E119 Type 2 diabetes mellitus without complications: Secondary | ICD-10-CM | POA: Diagnosis not present

## 2016-02-15 DIAGNOSIS — N2581 Secondary hyperparathyroidism of renal origin: Secondary | ICD-10-CM | POA: Diagnosis not present

## 2016-02-15 DIAGNOSIS — D509 Iron deficiency anemia, unspecified: Secondary | ICD-10-CM | POA: Diagnosis not present

## 2016-02-15 DIAGNOSIS — N186 End stage renal disease: Secondary | ICD-10-CM | POA: Diagnosis not present

## 2016-02-15 DIAGNOSIS — E119 Type 2 diabetes mellitus without complications: Secondary | ICD-10-CM | POA: Diagnosis not present

## 2016-02-17 DIAGNOSIS — E119 Type 2 diabetes mellitus without complications: Secondary | ICD-10-CM | POA: Diagnosis not present

## 2016-02-17 DIAGNOSIS — N2581 Secondary hyperparathyroidism of renal origin: Secondary | ICD-10-CM | POA: Diagnosis not present

## 2016-02-17 DIAGNOSIS — N186 End stage renal disease: Secondary | ICD-10-CM | POA: Diagnosis not present

## 2016-02-17 DIAGNOSIS — D509 Iron deficiency anemia, unspecified: Secondary | ICD-10-CM | POA: Diagnosis not present

## 2016-02-20 DIAGNOSIS — N186 End stage renal disease: Secondary | ICD-10-CM | POA: Diagnosis not present

## 2016-02-20 DIAGNOSIS — N2581 Secondary hyperparathyroidism of renal origin: Secondary | ICD-10-CM | POA: Diagnosis not present

## 2016-02-20 DIAGNOSIS — D509 Iron deficiency anemia, unspecified: Secondary | ICD-10-CM | POA: Diagnosis not present

## 2016-02-20 DIAGNOSIS — E119 Type 2 diabetes mellitus without complications: Secondary | ICD-10-CM | POA: Diagnosis not present

## 2016-02-22 DIAGNOSIS — N186 End stage renal disease: Secondary | ICD-10-CM | POA: Diagnosis not present

## 2016-02-22 DIAGNOSIS — E119 Type 2 diabetes mellitus without complications: Secondary | ICD-10-CM | POA: Diagnosis not present

## 2016-02-22 DIAGNOSIS — D509 Iron deficiency anemia, unspecified: Secondary | ICD-10-CM | POA: Diagnosis not present

## 2016-02-22 DIAGNOSIS — N2581 Secondary hyperparathyroidism of renal origin: Secondary | ICD-10-CM | POA: Diagnosis not present

## 2016-02-24 DIAGNOSIS — D509 Iron deficiency anemia, unspecified: Secondary | ICD-10-CM | POA: Diagnosis not present

## 2016-02-24 DIAGNOSIS — N2581 Secondary hyperparathyroidism of renal origin: Secondary | ICD-10-CM | POA: Diagnosis not present

## 2016-02-24 DIAGNOSIS — N186 End stage renal disease: Secondary | ICD-10-CM | POA: Diagnosis not present

## 2016-02-24 DIAGNOSIS — E119 Type 2 diabetes mellitus without complications: Secondary | ICD-10-CM | POA: Diagnosis not present

## 2016-02-25 DIAGNOSIS — N032 Chronic nephritic syndrome with diffuse membranous glomerulonephritis: Secondary | ICD-10-CM | POA: Diagnosis not present

## 2016-02-25 DIAGNOSIS — N186 End stage renal disease: Secondary | ICD-10-CM | POA: Diagnosis not present

## 2016-02-25 DIAGNOSIS — Z992 Dependence on renal dialysis: Secondary | ICD-10-CM | POA: Diagnosis not present

## 2016-02-26 DIAGNOSIS — F250 Schizoaffective disorder, bipolar type: Secondary | ICD-10-CM | POA: Diagnosis not present

## 2016-02-27 DIAGNOSIS — D509 Iron deficiency anemia, unspecified: Secondary | ICD-10-CM | POA: Diagnosis not present

## 2016-02-27 DIAGNOSIS — N186 End stage renal disease: Secondary | ICD-10-CM | POA: Diagnosis not present

## 2016-02-27 DIAGNOSIS — N2581 Secondary hyperparathyroidism of renal origin: Secondary | ICD-10-CM | POA: Diagnosis not present

## 2016-02-27 DIAGNOSIS — E119 Type 2 diabetes mellitus without complications: Secondary | ICD-10-CM | POA: Diagnosis not present

## 2016-02-29 DIAGNOSIS — N186 End stage renal disease: Secondary | ICD-10-CM | POA: Diagnosis not present

## 2016-02-29 DIAGNOSIS — N2581 Secondary hyperparathyroidism of renal origin: Secondary | ICD-10-CM | POA: Diagnosis not present

## 2016-02-29 DIAGNOSIS — E119 Type 2 diabetes mellitus without complications: Secondary | ICD-10-CM | POA: Diagnosis not present

## 2016-02-29 DIAGNOSIS — D509 Iron deficiency anemia, unspecified: Secondary | ICD-10-CM | POA: Diagnosis not present

## 2016-03-02 DIAGNOSIS — E119 Type 2 diabetes mellitus without complications: Secondary | ICD-10-CM | POA: Diagnosis not present

## 2016-03-02 DIAGNOSIS — N2581 Secondary hyperparathyroidism of renal origin: Secondary | ICD-10-CM | POA: Diagnosis not present

## 2016-03-02 DIAGNOSIS — D509 Iron deficiency anemia, unspecified: Secondary | ICD-10-CM | POA: Diagnosis not present

## 2016-03-02 DIAGNOSIS — N186 End stage renal disease: Secondary | ICD-10-CM | POA: Diagnosis not present

## 2016-03-04 DIAGNOSIS — E785 Hyperlipidemia, unspecified: Secondary | ICD-10-CM | POA: Diagnosis not present

## 2016-03-04 DIAGNOSIS — I10 Essential (primary) hypertension: Secondary | ICD-10-CM | POA: Diagnosis not present

## 2016-03-04 DIAGNOSIS — N186 End stage renal disease: Secondary | ICD-10-CM | POA: Diagnosis not present

## 2016-03-04 DIAGNOSIS — E1140 Type 2 diabetes mellitus with diabetic neuropathy, unspecified: Secondary | ICD-10-CM | POA: Diagnosis not present

## 2016-03-04 DIAGNOSIS — E1122 Type 2 diabetes mellitus with diabetic chronic kidney disease: Secondary | ICD-10-CM | POA: Diagnosis not present

## 2016-03-04 DIAGNOSIS — F209 Schizophrenia, unspecified: Secondary | ICD-10-CM | POA: Diagnosis not present

## 2016-03-04 DIAGNOSIS — K219 Gastro-esophageal reflux disease without esophagitis: Secondary | ICD-10-CM | POA: Diagnosis not present

## 2016-03-05 DIAGNOSIS — D509 Iron deficiency anemia, unspecified: Secondary | ICD-10-CM | POA: Diagnosis not present

## 2016-03-05 DIAGNOSIS — E119 Type 2 diabetes mellitus without complications: Secondary | ICD-10-CM | POA: Diagnosis not present

## 2016-03-05 DIAGNOSIS — N2581 Secondary hyperparathyroidism of renal origin: Secondary | ICD-10-CM | POA: Diagnosis not present

## 2016-03-05 DIAGNOSIS — N186 End stage renal disease: Secondary | ICD-10-CM | POA: Diagnosis not present

## 2016-03-07 DIAGNOSIS — D509 Iron deficiency anemia, unspecified: Secondary | ICD-10-CM | POA: Diagnosis not present

## 2016-03-07 DIAGNOSIS — N2581 Secondary hyperparathyroidism of renal origin: Secondary | ICD-10-CM | POA: Diagnosis not present

## 2016-03-07 DIAGNOSIS — E119 Type 2 diabetes mellitus without complications: Secondary | ICD-10-CM | POA: Diagnosis not present

## 2016-03-07 DIAGNOSIS — N186 End stage renal disease: Secondary | ICD-10-CM | POA: Diagnosis not present

## 2016-03-09 DIAGNOSIS — D509 Iron deficiency anemia, unspecified: Secondary | ICD-10-CM | POA: Diagnosis not present

## 2016-03-09 DIAGNOSIS — N186 End stage renal disease: Secondary | ICD-10-CM | POA: Diagnosis not present

## 2016-03-09 DIAGNOSIS — N2581 Secondary hyperparathyroidism of renal origin: Secondary | ICD-10-CM | POA: Diagnosis not present

## 2016-03-09 DIAGNOSIS — E119 Type 2 diabetes mellitus without complications: Secondary | ICD-10-CM | POA: Diagnosis not present

## 2016-03-12 DIAGNOSIS — E119 Type 2 diabetes mellitus without complications: Secondary | ICD-10-CM | POA: Diagnosis not present

## 2016-03-12 DIAGNOSIS — N2581 Secondary hyperparathyroidism of renal origin: Secondary | ICD-10-CM | POA: Diagnosis not present

## 2016-03-12 DIAGNOSIS — D509 Iron deficiency anemia, unspecified: Secondary | ICD-10-CM | POA: Diagnosis not present

## 2016-03-12 DIAGNOSIS — N186 End stage renal disease: Secondary | ICD-10-CM | POA: Diagnosis not present

## 2016-03-14 DIAGNOSIS — D509 Iron deficiency anemia, unspecified: Secondary | ICD-10-CM | POA: Diagnosis not present

## 2016-03-14 DIAGNOSIS — N186 End stage renal disease: Secondary | ICD-10-CM | POA: Diagnosis not present

## 2016-03-14 DIAGNOSIS — E119 Type 2 diabetes mellitus without complications: Secondary | ICD-10-CM | POA: Diagnosis not present

## 2016-03-14 DIAGNOSIS — N2581 Secondary hyperparathyroidism of renal origin: Secondary | ICD-10-CM | POA: Diagnosis not present

## 2016-03-16 DIAGNOSIS — N2581 Secondary hyperparathyroidism of renal origin: Secondary | ICD-10-CM | POA: Diagnosis not present

## 2016-03-16 DIAGNOSIS — D509 Iron deficiency anemia, unspecified: Secondary | ICD-10-CM | POA: Diagnosis not present

## 2016-03-16 DIAGNOSIS — N186 End stage renal disease: Secondary | ICD-10-CM | POA: Diagnosis not present

## 2016-03-16 DIAGNOSIS — E119 Type 2 diabetes mellitus without complications: Secondary | ICD-10-CM | POA: Diagnosis not present

## 2016-03-19 DIAGNOSIS — D509 Iron deficiency anemia, unspecified: Secondary | ICD-10-CM | POA: Diagnosis not present

## 2016-03-19 DIAGNOSIS — E119 Type 2 diabetes mellitus without complications: Secondary | ICD-10-CM | POA: Diagnosis not present

## 2016-03-19 DIAGNOSIS — N186 End stage renal disease: Secondary | ICD-10-CM | POA: Diagnosis not present

## 2016-03-19 DIAGNOSIS — N2581 Secondary hyperparathyroidism of renal origin: Secondary | ICD-10-CM | POA: Diagnosis not present

## 2016-03-21 DIAGNOSIS — N186 End stage renal disease: Secondary | ICD-10-CM | POA: Diagnosis not present

## 2016-03-21 DIAGNOSIS — D509 Iron deficiency anemia, unspecified: Secondary | ICD-10-CM | POA: Diagnosis not present

## 2016-03-21 DIAGNOSIS — E119 Type 2 diabetes mellitus without complications: Secondary | ICD-10-CM | POA: Diagnosis not present

## 2016-03-21 DIAGNOSIS — N2581 Secondary hyperparathyroidism of renal origin: Secondary | ICD-10-CM | POA: Diagnosis not present

## 2016-03-23 DIAGNOSIS — N2581 Secondary hyperparathyroidism of renal origin: Secondary | ICD-10-CM | POA: Diagnosis not present

## 2016-03-23 DIAGNOSIS — E119 Type 2 diabetes mellitus without complications: Secondary | ICD-10-CM | POA: Diagnosis not present

## 2016-03-23 DIAGNOSIS — D509 Iron deficiency anemia, unspecified: Secondary | ICD-10-CM | POA: Diagnosis not present

## 2016-03-23 DIAGNOSIS — N186 End stage renal disease: Secondary | ICD-10-CM | POA: Diagnosis not present

## 2016-03-26 DIAGNOSIS — N2581 Secondary hyperparathyroidism of renal origin: Secondary | ICD-10-CM | POA: Diagnosis not present

## 2016-03-26 DIAGNOSIS — D509 Iron deficiency anemia, unspecified: Secondary | ICD-10-CM | POA: Diagnosis not present

## 2016-03-26 DIAGNOSIS — E119 Type 2 diabetes mellitus without complications: Secondary | ICD-10-CM | POA: Diagnosis not present

## 2016-03-26 DIAGNOSIS — N186 End stage renal disease: Secondary | ICD-10-CM | POA: Diagnosis not present

## 2016-03-27 DIAGNOSIS — N032 Chronic nephritic syndrome with diffuse membranous glomerulonephritis: Secondary | ICD-10-CM | POA: Diagnosis not present

## 2016-03-27 DIAGNOSIS — N186 End stage renal disease: Secondary | ICD-10-CM | POA: Diagnosis not present

## 2016-03-27 DIAGNOSIS — Z992 Dependence on renal dialysis: Secondary | ICD-10-CM | POA: Diagnosis not present

## 2016-03-28 DIAGNOSIS — D509 Iron deficiency anemia, unspecified: Secondary | ICD-10-CM | POA: Diagnosis not present

## 2016-03-28 DIAGNOSIS — N186 End stage renal disease: Secondary | ICD-10-CM | POA: Diagnosis not present

## 2016-03-28 DIAGNOSIS — E119 Type 2 diabetes mellitus without complications: Secondary | ICD-10-CM | POA: Diagnosis not present

## 2016-03-28 DIAGNOSIS — N2581 Secondary hyperparathyroidism of renal origin: Secondary | ICD-10-CM | POA: Diagnosis not present

## 2016-03-30 DIAGNOSIS — D509 Iron deficiency anemia, unspecified: Secondary | ICD-10-CM | POA: Diagnosis not present

## 2016-03-30 DIAGNOSIS — N2581 Secondary hyperparathyroidism of renal origin: Secondary | ICD-10-CM | POA: Diagnosis not present

## 2016-03-30 DIAGNOSIS — N186 End stage renal disease: Secondary | ICD-10-CM | POA: Diagnosis not present

## 2016-03-30 DIAGNOSIS — E119 Type 2 diabetes mellitus without complications: Secondary | ICD-10-CM | POA: Diagnosis not present

## 2016-04-02 DIAGNOSIS — N2581 Secondary hyperparathyroidism of renal origin: Secondary | ICD-10-CM | POA: Diagnosis not present

## 2016-04-02 DIAGNOSIS — E119 Type 2 diabetes mellitus without complications: Secondary | ICD-10-CM | POA: Diagnosis not present

## 2016-04-02 DIAGNOSIS — D509 Iron deficiency anemia, unspecified: Secondary | ICD-10-CM | POA: Diagnosis not present

## 2016-04-02 DIAGNOSIS — N186 End stage renal disease: Secondary | ICD-10-CM | POA: Diagnosis not present

## 2016-04-04 DIAGNOSIS — E119 Type 2 diabetes mellitus without complications: Secondary | ICD-10-CM | POA: Diagnosis not present

## 2016-04-04 DIAGNOSIS — N2581 Secondary hyperparathyroidism of renal origin: Secondary | ICD-10-CM | POA: Diagnosis not present

## 2016-04-04 DIAGNOSIS — N186 End stage renal disease: Secondary | ICD-10-CM | POA: Diagnosis not present

## 2016-04-04 DIAGNOSIS — D509 Iron deficiency anemia, unspecified: Secondary | ICD-10-CM | POA: Diagnosis not present

## 2016-04-06 DIAGNOSIS — E119 Type 2 diabetes mellitus without complications: Secondary | ICD-10-CM | POA: Diagnosis not present

## 2016-04-06 DIAGNOSIS — N2581 Secondary hyperparathyroidism of renal origin: Secondary | ICD-10-CM | POA: Diagnosis not present

## 2016-04-06 DIAGNOSIS — N186 End stage renal disease: Secondary | ICD-10-CM | POA: Diagnosis not present

## 2016-04-06 DIAGNOSIS — D509 Iron deficiency anemia, unspecified: Secondary | ICD-10-CM | POA: Diagnosis not present

## 2016-04-09 DIAGNOSIS — D509 Iron deficiency anemia, unspecified: Secondary | ICD-10-CM | POA: Diagnosis not present

## 2016-04-09 DIAGNOSIS — N2581 Secondary hyperparathyroidism of renal origin: Secondary | ICD-10-CM | POA: Diagnosis not present

## 2016-04-09 DIAGNOSIS — N186 End stage renal disease: Secondary | ICD-10-CM | POA: Diagnosis not present

## 2016-04-09 DIAGNOSIS — E119 Type 2 diabetes mellitus without complications: Secondary | ICD-10-CM | POA: Diagnosis not present

## 2016-04-11 DIAGNOSIS — D509 Iron deficiency anemia, unspecified: Secondary | ICD-10-CM | POA: Diagnosis not present

## 2016-04-11 DIAGNOSIS — N186 End stage renal disease: Secondary | ICD-10-CM | POA: Diagnosis not present

## 2016-04-11 DIAGNOSIS — E119 Type 2 diabetes mellitus without complications: Secondary | ICD-10-CM | POA: Diagnosis not present

## 2016-04-11 DIAGNOSIS — N2581 Secondary hyperparathyroidism of renal origin: Secondary | ICD-10-CM | POA: Diagnosis not present

## 2016-04-13 DIAGNOSIS — E119 Type 2 diabetes mellitus without complications: Secondary | ICD-10-CM | POA: Diagnosis not present

## 2016-04-13 DIAGNOSIS — N186 End stage renal disease: Secondary | ICD-10-CM | POA: Diagnosis not present

## 2016-04-13 DIAGNOSIS — N2581 Secondary hyperparathyroidism of renal origin: Secondary | ICD-10-CM | POA: Diagnosis not present

## 2016-04-13 DIAGNOSIS — D509 Iron deficiency anemia, unspecified: Secondary | ICD-10-CM | POA: Diagnosis not present

## 2016-04-16 DIAGNOSIS — N186 End stage renal disease: Secondary | ICD-10-CM | POA: Diagnosis not present

## 2016-04-16 DIAGNOSIS — D509 Iron deficiency anemia, unspecified: Secondary | ICD-10-CM | POA: Diagnosis not present

## 2016-04-16 DIAGNOSIS — E119 Type 2 diabetes mellitus without complications: Secondary | ICD-10-CM | POA: Diagnosis not present

## 2016-04-16 DIAGNOSIS — N2581 Secondary hyperparathyroidism of renal origin: Secondary | ICD-10-CM | POA: Diagnosis not present

## 2016-04-18 DIAGNOSIS — N186 End stage renal disease: Secondary | ICD-10-CM | POA: Diagnosis not present

## 2016-04-18 DIAGNOSIS — E119 Type 2 diabetes mellitus without complications: Secondary | ICD-10-CM | POA: Diagnosis not present

## 2016-04-18 DIAGNOSIS — D509 Iron deficiency anemia, unspecified: Secondary | ICD-10-CM | POA: Diagnosis not present

## 2016-04-18 DIAGNOSIS — N2581 Secondary hyperparathyroidism of renal origin: Secondary | ICD-10-CM | POA: Diagnosis not present

## 2016-04-20 DIAGNOSIS — N2581 Secondary hyperparathyroidism of renal origin: Secondary | ICD-10-CM | POA: Diagnosis not present

## 2016-04-20 DIAGNOSIS — E119 Type 2 diabetes mellitus without complications: Secondary | ICD-10-CM | POA: Diagnosis not present

## 2016-04-20 DIAGNOSIS — D509 Iron deficiency anemia, unspecified: Secondary | ICD-10-CM | POA: Diagnosis not present

## 2016-04-20 DIAGNOSIS — N186 End stage renal disease: Secondary | ICD-10-CM | POA: Diagnosis not present

## 2016-04-23 DIAGNOSIS — E119 Type 2 diabetes mellitus without complications: Secondary | ICD-10-CM | POA: Diagnosis not present

## 2016-04-23 DIAGNOSIS — N2581 Secondary hyperparathyroidism of renal origin: Secondary | ICD-10-CM | POA: Diagnosis not present

## 2016-04-23 DIAGNOSIS — D509 Iron deficiency anemia, unspecified: Secondary | ICD-10-CM | POA: Diagnosis not present

## 2016-04-23 DIAGNOSIS — N186 End stage renal disease: Secondary | ICD-10-CM | POA: Diagnosis not present

## 2016-04-25 DIAGNOSIS — N2581 Secondary hyperparathyroidism of renal origin: Secondary | ICD-10-CM | POA: Diagnosis not present

## 2016-04-25 DIAGNOSIS — N186 End stage renal disease: Secondary | ICD-10-CM | POA: Diagnosis not present

## 2016-04-25 DIAGNOSIS — D509 Iron deficiency anemia, unspecified: Secondary | ICD-10-CM | POA: Diagnosis not present

## 2016-04-25 DIAGNOSIS — E119 Type 2 diabetes mellitus without complications: Secondary | ICD-10-CM | POA: Diagnosis not present

## 2016-04-26 DIAGNOSIS — Z992 Dependence on renal dialysis: Secondary | ICD-10-CM | POA: Diagnosis not present

## 2016-04-26 DIAGNOSIS — N186 End stage renal disease: Secondary | ICD-10-CM | POA: Diagnosis not present

## 2016-04-26 DIAGNOSIS — N032 Chronic nephritic syndrome with diffuse membranous glomerulonephritis: Secondary | ICD-10-CM | POA: Diagnosis not present

## 2016-04-27 DIAGNOSIS — N2581 Secondary hyperparathyroidism of renal origin: Secondary | ICD-10-CM | POA: Diagnosis not present

## 2016-04-27 DIAGNOSIS — N186 End stage renal disease: Secondary | ICD-10-CM | POA: Diagnosis not present

## 2016-04-27 DIAGNOSIS — D509 Iron deficiency anemia, unspecified: Secondary | ICD-10-CM | POA: Diagnosis not present

## 2016-04-27 DIAGNOSIS — E119 Type 2 diabetes mellitus without complications: Secondary | ICD-10-CM | POA: Diagnosis not present

## 2016-04-29 ENCOUNTER — Telehealth: Payer: Self-pay | Admitting: Cardiology

## 2016-04-29 ENCOUNTER — Ambulatory Visit: Payer: Medicare Other | Admitting: *Deleted

## 2016-04-29 NOTE — Telephone Encounter (Signed)
LMOVM reminding pt to send remote transmission.   

## 2016-04-30 DIAGNOSIS — N186 End stage renal disease: Secondary | ICD-10-CM | POA: Diagnosis not present

## 2016-04-30 DIAGNOSIS — E119 Type 2 diabetes mellitus without complications: Secondary | ICD-10-CM | POA: Diagnosis not present

## 2016-04-30 DIAGNOSIS — D509 Iron deficiency anemia, unspecified: Secondary | ICD-10-CM | POA: Diagnosis not present

## 2016-04-30 DIAGNOSIS — N2581 Secondary hyperparathyroidism of renal origin: Secondary | ICD-10-CM | POA: Diagnosis not present

## 2016-05-02 DIAGNOSIS — N2581 Secondary hyperparathyroidism of renal origin: Secondary | ICD-10-CM | POA: Diagnosis not present

## 2016-05-02 DIAGNOSIS — E119 Type 2 diabetes mellitus without complications: Secondary | ICD-10-CM | POA: Diagnosis not present

## 2016-05-02 DIAGNOSIS — D509 Iron deficiency anemia, unspecified: Secondary | ICD-10-CM | POA: Diagnosis not present

## 2016-05-02 DIAGNOSIS — N186 End stage renal disease: Secondary | ICD-10-CM | POA: Diagnosis not present

## 2016-05-03 ENCOUNTER — Encounter: Payer: Self-pay | Admitting: Cardiology

## 2016-05-04 DIAGNOSIS — N2581 Secondary hyperparathyroidism of renal origin: Secondary | ICD-10-CM | POA: Diagnosis not present

## 2016-05-04 DIAGNOSIS — E119 Type 2 diabetes mellitus without complications: Secondary | ICD-10-CM | POA: Diagnosis not present

## 2016-05-04 DIAGNOSIS — N186 End stage renal disease: Secondary | ICD-10-CM | POA: Diagnosis not present

## 2016-05-04 DIAGNOSIS — D509 Iron deficiency anemia, unspecified: Secondary | ICD-10-CM | POA: Diagnosis not present

## 2016-05-07 DIAGNOSIS — D509 Iron deficiency anemia, unspecified: Secondary | ICD-10-CM | POA: Diagnosis not present

## 2016-05-07 DIAGNOSIS — E119 Type 2 diabetes mellitus without complications: Secondary | ICD-10-CM | POA: Diagnosis not present

## 2016-05-07 DIAGNOSIS — N2581 Secondary hyperparathyroidism of renal origin: Secondary | ICD-10-CM | POA: Diagnosis not present

## 2016-05-07 DIAGNOSIS — N186 End stage renal disease: Secondary | ICD-10-CM | POA: Diagnosis not present

## 2016-05-09 DIAGNOSIS — N186 End stage renal disease: Secondary | ICD-10-CM | POA: Diagnosis not present

## 2016-05-09 DIAGNOSIS — E1129 Type 2 diabetes mellitus with other diabetic kidney complication: Secondary | ICD-10-CM | POA: Diagnosis not present

## 2016-05-09 DIAGNOSIS — N2581 Secondary hyperparathyroidism of renal origin: Secondary | ICD-10-CM | POA: Diagnosis not present

## 2016-05-09 DIAGNOSIS — E119 Type 2 diabetes mellitus without complications: Secondary | ICD-10-CM | POA: Diagnosis not present

## 2016-05-09 DIAGNOSIS — D509 Iron deficiency anemia, unspecified: Secondary | ICD-10-CM | POA: Diagnosis not present

## 2016-05-11 DIAGNOSIS — N2581 Secondary hyperparathyroidism of renal origin: Secondary | ICD-10-CM | POA: Diagnosis not present

## 2016-05-11 DIAGNOSIS — D509 Iron deficiency anemia, unspecified: Secondary | ICD-10-CM | POA: Diagnosis not present

## 2016-05-11 DIAGNOSIS — E119 Type 2 diabetes mellitus without complications: Secondary | ICD-10-CM | POA: Diagnosis not present

## 2016-05-11 DIAGNOSIS — N186 End stage renal disease: Secondary | ICD-10-CM | POA: Diagnosis not present

## 2016-05-14 DIAGNOSIS — N186 End stage renal disease: Secondary | ICD-10-CM | POA: Diagnosis not present

## 2016-05-14 DIAGNOSIS — D509 Iron deficiency anemia, unspecified: Secondary | ICD-10-CM | POA: Diagnosis not present

## 2016-05-14 DIAGNOSIS — E119 Type 2 diabetes mellitus without complications: Secondary | ICD-10-CM | POA: Diagnosis not present

## 2016-05-14 DIAGNOSIS — N2581 Secondary hyperparathyroidism of renal origin: Secondary | ICD-10-CM | POA: Diagnosis not present

## 2016-05-15 DIAGNOSIS — Z113 Encounter for screening for infections with a predominantly sexual mode of transmission: Secondary | ICD-10-CM | POA: Diagnosis not present

## 2016-05-15 DIAGNOSIS — E785 Hyperlipidemia, unspecified: Secondary | ICD-10-CM | POA: Diagnosis not present

## 2016-05-15 DIAGNOSIS — Z01118 Encounter for examination of ears and hearing with other abnormal findings: Secondary | ICD-10-CM | POA: Diagnosis not present

## 2016-05-15 DIAGNOSIS — Z131 Encounter for screening for diabetes mellitus: Secondary | ICD-10-CM | POA: Diagnosis not present

## 2016-05-15 DIAGNOSIS — F209 Schizophrenia, unspecified: Secondary | ICD-10-CM | POA: Diagnosis not present

## 2016-05-15 DIAGNOSIS — Z0000 Encounter for general adult medical examination without abnormal findings: Secondary | ICD-10-CM | POA: Diagnosis not present

## 2016-05-15 DIAGNOSIS — E1140 Type 2 diabetes mellitus with diabetic neuropathy, unspecified: Secondary | ICD-10-CM | POA: Diagnosis not present

## 2016-05-15 DIAGNOSIS — E1122 Type 2 diabetes mellitus with diabetic chronic kidney disease: Secondary | ICD-10-CM | POA: Diagnosis not present

## 2016-05-15 DIAGNOSIS — K219 Gastro-esophageal reflux disease without esophagitis: Secondary | ICD-10-CM | POA: Diagnosis not present

## 2016-05-15 DIAGNOSIS — N186 End stage renal disease: Secondary | ICD-10-CM | POA: Diagnosis not present

## 2016-05-15 DIAGNOSIS — Z136 Encounter for screening for cardiovascular disorders: Secondary | ICD-10-CM | POA: Diagnosis not present

## 2016-05-15 DIAGNOSIS — H538 Other visual disturbances: Secondary | ICD-10-CM | POA: Diagnosis not present

## 2016-05-15 DIAGNOSIS — Z1383 Encounter for screening for respiratory disorder NEC: Secondary | ICD-10-CM | POA: Diagnosis not present

## 2016-05-15 DIAGNOSIS — I10 Essential (primary) hypertension: Secondary | ICD-10-CM | POA: Diagnosis not present

## 2016-05-16 DIAGNOSIS — N2581 Secondary hyperparathyroidism of renal origin: Secondary | ICD-10-CM | POA: Diagnosis not present

## 2016-05-16 DIAGNOSIS — D509 Iron deficiency anemia, unspecified: Secondary | ICD-10-CM | POA: Diagnosis not present

## 2016-05-16 DIAGNOSIS — N186 End stage renal disease: Secondary | ICD-10-CM | POA: Diagnosis not present

## 2016-05-16 DIAGNOSIS — E119 Type 2 diabetes mellitus without complications: Secondary | ICD-10-CM | POA: Diagnosis not present

## 2016-05-18 DIAGNOSIS — N186 End stage renal disease: Secondary | ICD-10-CM | POA: Diagnosis not present

## 2016-05-18 DIAGNOSIS — N2581 Secondary hyperparathyroidism of renal origin: Secondary | ICD-10-CM | POA: Diagnosis not present

## 2016-05-18 DIAGNOSIS — D509 Iron deficiency anemia, unspecified: Secondary | ICD-10-CM | POA: Diagnosis not present

## 2016-05-18 DIAGNOSIS — E119 Type 2 diabetes mellitus without complications: Secondary | ICD-10-CM | POA: Diagnosis not present

## 2016-05-21 DIAGNOSIS — D509 Iron deficiency anemia, unspecified: Secondary | ICD-10-CM | POA: Diagnosis not present

## 2016-05-21 DIAGNOSIS — N186 End stage renal disease: Secondary | ICD-10-CM | POA: Diagnosis not present

## 2016-05-21 DIAGNOSIS — N2581 Secondary hyperparathyroidism of renal origin: Secondary | ICD-10-CM | POA: Diagnosis not present

## 2016-05-21 DIAGNOSIS — E119 Type 2 diabetes mellitus without complications: Secondary | ICD-10-CM | POA: Diagnosis not present

## 2016-05-23 DIAGNOSIS — N186 End stage renal disease: Secondary | ICD-10-CM | POA: Diagnosis not present

## 2016-05-23 DIAGNOSIS — E119 Type 2 diabetes mellitus without complications: Secondary | ICD-10-CM | POA: Diagnosis not present

## 2016-05-23 DIAGNOSIS — N2581 Secondary hyperparathyroidism of renal origin: Secondary | ICD-10-CM | POA: Diagnosis not present

## 2016-05-23 DIAGNOSIS — D509 Iron deficiency anemia, unspecified: Secondary | ICD-10-CM | POA: Diagnosis not present

## 2016-05-24 ENCOUNTER — Ambulatory Visit (INDEPENDENT_AMBULATORY_CARE_PROVIDER_SITE_OTHER): Payer: Medicare Other | Admitting: *Deleted

## 2016-05-24 DIAGNOSIS — I4901 Ventricular fibrillation: Secondary | ICD-10-CM

## 2016-05-24 NOTE — Progress Notes (Signed)
Remote ICD transmission.   

## 2016-05-25 DIAGNOSIS — N2581 Secondary hyperparathyroidism of renal origin: Secondary | ICD-10-CM | POA: Diagnosis not present

## 2016-05-25 DIAGNOSIS — E119 Type 2 diabetes mellitus without complications: Secondary | ICD-10-CM | POA: Diagnosis not present

## 2016-05-25 DIAGNOSIS — D509 Iron deficiency anemia, unspecified: Secondary | ICD-10-CM | POA: Diagnosis not present

## 2016-05-25 DIAGNOSIS — N186 End stage renal disease: Secondary | ICD-10-CM | POA: Diagnosis not present

## 2016-05-27 ENCOUNTER — Encounter: Payer: Self-pay | Admitting: Cardiology

## 2016-05-27 DIAGNOSIS — Z992 Dependence on renal dialysis: Secondary | ICD-10-CM | POA: Diagnosis not present

## 2016-05-27 DIAGNOSIS — N032 Chronic nephritic syndrome with diffuse membranous glomerulonephritis: Secondary | ICD-10-CM | POA: Diagnosis not present

## 2016-05-27 DIAGNOSIS — N186 End stage renal disease: Secondary | ICD-10-CM | POA: Diagnosis not present

## 2016-05-28 DIAGNOSIS — N2581 Secondary hyperparathyroidism of renal origin: Secondary | ICD-10-CM | POA: Diagnosis not present

## 2016-05-28 DIAGNOSIS — N186 End stage renal disease: Secondary | ICD-10-CM | POA: Diagnosis not present

## 2016-05-28 DIAGNOSIS — D509 Iron deficiency anemia, unspecified: Secondary | ICD-10-CM | POA: Diagnosis not present

## 2016-05-28 DIAGNOSIS — E119 Type 2 diabetes mellitus without complications: Secondary | ICD-10-CM | POA: Diagnosis not present

## 2016-05-30 DIAGNOSIS — D509 Iron deficiency anemia, unspecified: Secondary | ICD-10-CM | POA: Diagnosis not present

## 2016-05-30 DIAGNOSIS — N2581 Secondary hyperparathyroidism of renal origin: Secondary | ICD-10-CM | POA: Diagnosis not present

## 2016-05-30 DIAGNOSIS — N186 End stage renal disease: Secondary | ICD-10-CM | POA: Diagnosis not present

## 2016-05-30 DIAGNOSIS — E119 Type 2 diabetes mellitus without complications: Secondary | ICD-10-CM | POA: Diagnosis not present

## 2016-05-31 LAB — CUP PACEART REMOTE DEVICE CHECK
Battery Voltage: 2.81 V
Brady Statistic RV Percent Paced: 0.01 %
Date Time Interrogation Session: 20170728132519
HighPow Impedance: 53 Ohm
HighPow Impedance: 73 Ohm
Implantable Lead Implant Date: 20100415
Implantable Lead Location: 753860
Implantable Lead Model: 6947
Lead Channel Impedance Value: 494 Ohm
Lead Channel Sensing Intrinsic Amplitude: 6 mV
Lead Channel Sensing Intrinsic Amplitude: 6 mV
Lead Channel Setting Pacing Amplitude: 2.5 V
Lead Channel Setting Pacing Pulse Width: 0.8 ms
Lead Channel Setting Sensing Sensitivity: 0.3 mV

## 2016-06-01 DIAGNOSIS — N186 End stage renal disease: Secondary | ICD-10-CM | POA: Diagnosis not present

## 2016-06-01 DIAGNOSIS — E119 Type 2 diabetes mellitus without complications: Secondary | ICD-10-CM | POA: Diagnosis not present

## 2016-06-01 DIAGNOSIS — N2581 Secondary hyperparathyroidism of renal origin: Secondary | ICD-10-CM | POA: Diagnosis not present

## 2016-06-01 DIAGNOSIS — D509 Iron deficiency anemia, unspecified: Secondary | ICD-10-CM | POA: Diagnosis not present

## 2016-06-04 DIAGNOSIS — N2581 Secondary hyperparathyroidism of renal origin: Secondary | ICD-10-CM | POA: Diagnosis not present

## 2016-06-04 DIAGNOSIS — N186 End stage renal disease: Secondary | ICD-10-CM | POA: Diagnosis not present

## 2016-06-04 DIAGNOSIS — D509 Iron deficiency anemia, unspecified: Secondary | ICD-10-CM | POA: Diagnosis not present

## 2016-06-04 DIAGNOSIS — E119 Type 2 diabetes mellitus without complications: Secondary | ICD-10-CM | POA: Diagnosis not present

## 2016-06-06 DIAGNOSIS — N2581 Secondary hyperparathyroidism of renal origin: Secondary | ICD-10-CM | POA: Diagnosis not present

## 2016-06-06 DIAGNOSIS — E119 Type 2 diabetes mellitus without complications: Secondary | ICD-10-CM | POA: Diagnosis not present

## 2016-06-06 DIAGNOSIS — N186 End stage renal disease: Secondary | ICD-10-CM | POA: Diagnosis not present

## 2016-06-06 DIAGNOSIS — D509 Iron deficiency anemia, unspecified: Secondary | ICD-10-CM | POA: Diagnosis not present

## 2016-06-08 DIAGNOSIS — D509 Iron deficiency anemia, unspecified: Secondary | ICD-10-CM | POA: Diagnosis not present

## 2016-06-08 DIAGNOSIS — N186 End stage renal disease: Secondary | ICD-10-CM | POA: Diagnosis not present

## 2016-06-08 DIAGNOSIS — N2581 Secondary hyperparathyroidism of renal origin: Secondary | ICD-10-CM | POA: Diagnosis not present

## 2016-06-08 DIAGNOSIS — E119 Type 2 diabetes mellitus without complications: Secondary | ICD-10-CM | POA: Diagnosis not present

## 2016-06-11 DIAGNOSIS — N186 End stage renal disease: Secondary | ICD-10-CM | POA: Diagnosis not present

## 2016-06-11 DIAGNOSIS — N2581 Secondary hyperparathyroidism of renal origin: Secondary | ICD-10-CM | POA: Diagnosis not present

## 2016-06-11 DIAGNOSIS — D509 Iron deficiency anemia, unspecified: Secondary | ICD-10-CM | POA: Diagnosis not present

## 2016-06-11 DIAGNOSIS — E119 Type 2 diabetes mellitus without complications: Secondary | ICD-10-CM | POA: Diagnosis not present

## 2016-06-13 DIAGNOSIS — N186 End stage renal disease: Secondary | ICD-10-CM | POA: Diagnosis not present

## 2016-06-13 DIAGNOSIS — D509 Iron deficiency anemia, unspecified: Secondary | ICD-10-CM | POA: Diagnosis not present

## 2016-06-13 DIAGNOSIS — N2581 Secondary hyperparathyroidism of renal origin: Secondary | ICD-10-CM | POA: Diagnosis not present

## 2016-06-13 DIAGNOSIS — E119 Type 2 diabetes mellitus without complications: Secondary | ICD-10-CM | POA: Diagnosis not present

## 2016-06-15 DIAGNOSIS — D509 Iron deficiency anemia, unspecified: Secondary | ICD-10-CM | POA: Diagnosis not present

## 2016-06-15 DIAGNOSIS — E119 Type 2 diabetes mellitus without complications: Secondary | ICD-10-CM | POA: Diagnosis not present

## 2016-06-15 DIAGNOSIS — N186 End stage renal disease: Secondary | ICD-10-CM | POA: Diagnosis not present

## 2016-06-15 DIAGNOSIS — N2581 Secondary hyperparathyroidism of renal origin: Secondary | ICD-10-CM | POA: Diagnosis not present

## 2016-06-18 DIAGNOSIS — D509 Iron deficiency anemia, unspecified: Secondary | ICD-10-CM | POA: Diagnosis not present

## 2016-06-18 DIAGNOSIS — N186 End stage renal disease: Secondary | ICD-10-CM | POA: Diagnosis not present

## 2016-06-18 DIAGNOSIS — N2581 Secondary hyperparathyroidism of renal origin: Secondary | ICD-10-CM | POA: Diagnosis not present

## 2016-06-18 DIAGNOSIS — E119 Type 2 diabetes mellitus without complications: Secondary | ICD-10-CM | POA: Diagnosis not present

## 2016-06-20 DIAGNOSIS — N2581 Secondary hyperparathyroidism of renal origin: Secondary | ICD-10-CM | POA: Diagnosis not present

## 2016-06-20 DIAGNOSIS — N186 End stage renal disease: Secondary | ICD-10-CM | POA: Diagnosis not present

## 2016-06-20 DIAGNOSIS — D509 Iron deficiency anemia, unspecified: Secondary | ICD-10-CM | POA: Diagnosis not present

## 2016-06-20 DIAGNOSIS — E119 Type 2 diabetes mellitus without complications: Secondary | ICD-10-CM | POA: Diagnosis not present

## 2016-06-22 DIAGNOSIS — E119 Type 2 diabetes mellitus without complications: Secondary | ICD-10-CM | POA: Diagnosis not present

## 2016-06-22 DIAGNOSIS — D509 Iron deficiency anemia, unspecified: Secondary | ICD-10-CM | POA: Diagnosis not present

## 2016-06-22 DIAGNOSIS — N2581 Secondary hyperparathyroidism of renal origin: Secondary | ICD-10-CM | POA: Diagnosis not present

## 2016-06-22 DIAGNOSIS — N186 End stage renal disease: Secondary | ICD-10-CM | POA: Diagnosis not present

## 2016-06-25 DIAGNOSIS — E119 Type 2 diabetes mellitus without complications: Secondary | ICD-10-CM | POA: Diagnosis not present

## 2016-06-25 DIAGNOSIS — N186 End stage renal disease: Secondary | ICD-10-CM | POA: Diagnosis not present

## 2016-06-25 DIAGNOSIS — N2581 Secondary hyperparathyroidism of renal origin: Secondary | ICD-10-CM | POA: Diagnosis not present

## 2016-06-25 DIAGNOSIS — D509 Iron deficiency anemia, unspecified: Secondary | ICD-10-CM | POA: Diagnosis not present

## 2016-06-27 DIAGNOSIS — Z992 Dependence on renal dialysis: Secondary | ICD-10-CM | POA: Diagnosis not present

## 2016-06-27 DIAGNOSIS — N032 Chronic nephritic syndrome with diffuse membranous glomerulonephritis: Secondary | ICD-10-CM | POA: Diagnosis not present

## 2016-06-27 DIAGNOSIS — E119 Type 2 diabetes mellitus without complications: Secondary | ICD-10-CM | POA: Diagnosis not present

## 2016-06-27 DIAGNOSIS — N2581 Secondary hyperparathyroidism of renal origin: Secondary | ICD-10-CM | POA: Diagnosis not present

## 2016-06-27 DIAGNOSIS — D509 Iron deficiency anemia, unspecified: Secondary | ICD-10-CM | POA: Diagnosis not present

## 2016-06-27 DIAGNOSIS — N186 End stage renal disease: Secondary | ICD-10-CM | POA: Diagnosis not present

## 2016-06-29 DIAGNOSIS — N186 End stage renal disease: Secondary | ICD-10-CM | POA: Diagnosis not present

## 2016-06-29 DIAGNOSIS — Z23 Encounter for immunization: Secondary | ICD-10-CM | POA: Diagnosis not present

## 2016-06-29 DIAGNOSIS — E119 Type 2 diabetes mellitus without complications: Secondary | ICD-10-CM | POA: Diagnosis not present

## 2016-06-29 DIAGNOSIS — D509 Iron deficiency anemia, unspecified: Secondary | ICD-10-CM | POA: Diagnosis not present

## 2016-06-29 DIAGNOSIS — N2581 Secondary hyperparathyroidism of renal origin: Secondary | ICD-10-CM | POA: Diagnosis not present

## 2016-07-02 DIAGNOSIS — D509 Iron deficiency anemia, unspecified: Secondary | ICD-10-CM | POA: Diagnosis not present

## 2016-07-02 DIAGNOSIS — N186 End stage renal disease: Secondary | ICD-10-CM | POA: Diagnosis not present

## 2016-07-02 DIAGNOSIS — Z23 Encounter for immunization: Secondary | ICD-10-CM | POA: Diagnosis not present

## 2016-07-02 DIAGNOSIS — E119 Type 2 diabetes mellitus without complications: Secondary | ICD-10-CM | POA: Diagnosis not present

## 2016-07-02 DIAGNOSIS — N2581 Secondary hyperparathyroidism of renal origin: Secondary | ICD-10-CM | POA: Diagnosis not present

## 2016-07-04 DIAGNOSIS — N186 End stage renal disease: Secondary | ICD-10-CM | POA: Diagnosis not present

## 2016-07-04 DIAGNOSIS — D509 Iron deficiency anemia, unspecified: Secondary | ICD-10-CM | POA: Diagnosis not present

## 2016-07-04 DIAGNOSIS — E119 Type 2 diabetes mellitus without complications: Secondary | ICD-10-CM | POA: Diagnosis not present

## 2016-07-04 DIAGNOSIS — N2581 Secondary hyperparathyroidism of renal origin: Secondary | ICD-10-CM | POA: Diagnosis not present

## 2016-07-04 DIAGNOSIS — Z23 Encounter for immunization: Secondary | ICD-10-CM | POA: Diagnosis not present

## 2016-07-06 ENCOUNTER — Encounter (HOSPITAL_COMMUNITY): Payer: Self-pay | Admitting: Emergency Medicine

## 2016-07-06 ENCOUNTER — Emergency Department (HOSPITAL_COMMUNITY)
Admission: EM | Admit: 2016-07-06 | Discharge: 2016-07-06 | Disposition: A | Discharge status: Home / Self Care | Payer: Medicare Other | Attending: Emergency Medicine | Admitting: Emergency Medicine

## 2016-07-06 ENCOUNTER — Emergency Department (HOSPITAL_COMMUNITY): Payer: Medicare Other

## 2016-07-06 DIAGNOSIS — Z87891 Personal history of nicotine dependence: Secondary | ICD-10-CM | POA: Diagnosis not present

## 2016-07-06 DIAGNOSIS — Z23 Encounter for immunization: Secondary | ICD-10-CM | POA: Diagnosis not present

## 2016-07-06 DIAGNOSIS — E119 Type 2 diabetes mellitus without complications: Secondary | ICD-10-CM | POA: Diagnosis not present

## 2016-07-06 DIAGNOSIS — R319 Hematuria, unspecified: Secondary | ICD-10-CM

## 2016-07-06 DIAGNOSIS — Z992 Dependence on renal dialysis: Secondary | ICD-10-CM | POA: Insufficient documentation

## 2016-07-06 DIAGNOSIS — N390 Urinary tract infection, site not specified: Secondary | ICD-10-CM | POA: Insufficient documentation

## 2016-07-06 DIAGNOSIS — Z794 Long term (current) use of insulin: Secondary | ICD-10-CM | POA: Insufficient documentation

## 2016-07-06 DIAGNOSIS — N186 End stage renal disease: Secondary | ICD-10-CM | POA: Insufficient documentation

## 2016-07-06 DIAGNOSIS — E1122 Type 2 diabetes mellitus with diabetic chronic kidney disease: Secondary | ICD-10-CM | POA: Insufficient documentation

## 2016-07-06 DIAGNOSIS — Z95810 Presence of automatic (implantable) cardiac defibrillator: Secondary | ICD-10-CM | POA: Insufficient documentation

## 2016-07-06 DIAGNOSIS — R509 Fever, unspecified: Secondary | ICD-10-CM | POA: Diagnosis not present

## 2016-07-06 DIAGNOSIS — Z955 Presence of coronary angioplasty implant and graft: Secondary | ICD-10-CM | POA: Diagnosis not present

## 2016-07-06 DIAGNOSIS — I120 Hypertensive chronic kidney disease with stage 5 chronic kidney disease or end stage renal disease: Secondary | ICD-10-CM | POA: Diagnosis not present

## 2016-07-06 DIAGNOSIS — N2581 Secondary hyperparathyroidism of renal origin: Secondary | ICD-10-CM | POA: Diagnosis not present

## 2016-07-06 DIAGNOSIS — D509 Iron deficiency anemia, unspecified: Secondary | ICD-10-CM | POA: Diagnosis not present

## 2016-07-06 DIAGNOSIS — N200 Calculus of kidney: Secondary | ICD-10-CM | POA: Diagnosis not present

## 2016-07-06 LAB — BASIC METABOLIC PANEL
Anion gap: 10 (ref 5–15)
BUN: 14 mg/dL (ref 6–20)
CO2: 30 mmol/L (ref 22–32)
Calcium: 8.3 mg/dL — ABNORMAL LOW (ref 8.9–10.3)
Chloride: 95 mmol/L — ABNORMAL LOW (ref 101–111)
Creatinine, Ser: 4.62 mg/dL — ABNORMAL HIGH (ref 0.61–1.24)
GFR calc Af Amer: 16 mL/min — ABNORMAL LOW (ref >60)
GFR calc non Af Amer: 14 mL/min — ABNORMAL LOW (ref >60)
Glucose, Bld: 97 mg/dL (ref 65–99)
Potassium: 3.4 mmol/L — ABNORMAL LOW (ref 3.5–5.1)
Sodium: 135 mmol/L (ref 135–145)

## 2016-07-06 LAB — URINALYSIS, ROUTINE W REFLEX MICROSCOPIC
Bilirubin Urine: NEGATIVE
Glucose, UA: NEGATIVE mg/dL
Ketones, ur: NEGATIVE mg/dL
Nitrite: NEGATIVE
Protein, ur: >300 mg/dL — AB
Specific Gravity, Urine: 1.02 (ref 1.005–1.030)
pH: 7.5 (ref 5.0–8.0)

## 2016-07-06 LAB — PROTIME-INR
INR: 1.04
Prothrombin Time: 13.6 seconds (ref 11.4–15.2)

## 2016-07-06 LAB — CBC
HCT: 40.5 % (ref 39.0–52.0)
Hemoglobin: 13.2 g/dL (ref 13.0–17.0)
MCH: 31.4 pg (ref 26.0–34.0)
MCHC: 32.6 g/dL (ref 30.0–36.0)
MCV: 96.2 fL (ref 78.0–100.0)
Platelets: 171 10*3/uL (ref 150–400)
RBC: 4.21 MIL/uL — ABNORMAL LOW (ref 4.22–5.81)
RDW: 13.4 % (ref 11.5–15.5)
WBC: 15.6 10*3/uL — ABNORMAL HIGH (ref 4.0–10.5)

## 2016-07-06 LAB — URINE MICROSCOPIC-ADD ON

## 2016-07-06 LAB — I-STAT CG4 LACTIC ACID, ED: Lactic Acid, Venous: 1.01 mmol/L (ref 0.5–1.9)

## 2016-07-06 MED ORDER — HYDROCODONE-ACETAMINOPHEN 5-325 MG PO TABS: 1.0000 | ORAL_TABLET | ORAL | 0 refills | Status: DC | PRN

## 2016-07-06 MED ORDER — DEXTROSE 5 % IV SOLN
1.0000 g | Freq: Once | INTRAVENOUS | Status: AC
  Administered 2016-07-06: 1 g via INTRAVENOUS
  Filled 2016-07-06: qty 10

## 2016-07-06 MED ORDER — CEPHALEXIN 500 MG PO CAPS: 500.0000 mg | ORAL_CAPSULE | Freq: Two times a day (BID) | ORAL | 0 refills | Status: DC

## 2016-07-06 NOTE — ED Notes (Signed)
Patient provided urinal to give urine sample

## 2016-07-06 NOTE — ED Notes (Signed)
Pt and family understood dc material and follow up appointment. NAD noted. Scripts given at [employer] [facility]. Spoke with p[atricia at [facility] and oked DC plans

## 2016-07-06 NOTE — ED Triage Notes (Signed)
Pt arrives via [facility] from dialysis, ems reports pt received 3 hrs of dialysis today, began having chills during session, pt had bloody urine at dialysis (sample sent with patient.) ems reports vss, pt was afebrile at dialysis.

## 2016-07-06 NOTE — ED Provider Notes (Signed)
[place] DEPT Provider Note   CSN: [id number] Arrival date & time: 07/06/16  1629   History   Chief Complaint Chief Complaint  Patient presents with  . Chills  . Hematuria    HPI James Nicholson is a 50 y.o. male.  The history is provided by the patient, a caregiver and medical records. Language interpreter used: unable to obtain, most history obtained from patient's limited English and from speaking to caregiver/friend from group home and family.  Hematuria  This is a new problem. The current episode started less than 1 hour ago (occurred in the middle of HD session). The problem occurs constantly. The problem has not changed since onset.Associated symptoms include abdominal pain (suprapubic, sharp, sudden onset, nonradiating, moderate severity ). Pertinent negatives include no chest pain and no shortness of breath. He has tried nothing for the symptoms.     Past Medical History:  Diagnosis Date  . Anemia   . Atrial fibrillation   . Cardiomyopathy ([place]) 2010   Unclear Etiology: Last Echo 06/2009: EF 40-45%, severe Lateral & apical Hypokinesis (? CAD) ; Grade 2 DDysfxn. Mild conc LVH.   Marland Kitchen Cellulitis   . Chronic kidney disease   . Dyslipidemia   . Enterobacter sepsis ([place])   . Hyperlipidemia   . Hypertension   . S/P ICD (internal cardiac defibrillator) procedure 2010   VT Arrest (in [place])  . Schizophrenia [facility])     Patient Active Problem List   Diagnosis Date Noted  . Hyperlipidemia   . Sepsis ([place]) 11/03/2014  . IDDM (insulin dependent diabetes mellitus) ([place]) 11/03/2014  . Troponin level elevated 12/22/2013    Class: Acute  . HCAP (healthcare-associated pneumonia) 12/21/2013  . Mechanical complication of other vascular device, implant, and graft 08/04/2013  . Other complications due to renal dialysis device, implant, and graft 08/04/2013  . End stage renal disease ([place]) 04/13/2013  . ARF (acute renal failure) ([place]) 02/05/2013  . Anemia in chronic kidney  disease 02/05/2013  . Pneumonia 02/05/2013  . GI bleed 02/05/2013  . Metabolic acidosis 79/89/2119  . Wegener's disease, pulmonary ([place]) 02/05/2013  . PPD positive - completed 9 months INH (per Dr. Linus Salmons) 05/25/2012  . Other primary cardiomyopathies 05/07/2012  . Ventricular fibrillation ([place]) 05/07/2012    Class: History of  . Hypertension 05/07/2012  . Automatic implantable cardioverter-defibrillator in situ 05/07/2012    Past Surgical History:  Procedure Laterality Date  . AV FISTULA PLACEMENT Right 02/10/2013   Procedure: ARTERIOVENOUS (AV) FISTULA CREATION;  Surgeon: Rosetta Posner, MD;  Location[facility] OR;  Service: Vascular;  Laterality: Right;  Right forearm radial/cephalic arterovenous fistula.   Marland Kitchen CARDIAC CATHETERIZATION  2010   [place]: In setting of VT arrest. Per brother's report, nonobstructive with no intervention  . CARDIAC DEFIBRILLATOR PLACEMENT  2010   [place]  . FISTULOGRAM Right 08/09/2013   Procedure: FISTULOGRAM;  Surgeon: Angelia Mould, MD;  Location: [facility] CATH LAB;  Service: Cardiovascular;  Laterality: Right;  . LIGATION OF COMPETING BRANCHES OF ARTERIOVENOUS FISTULA Right 08/13/2013   Procedure: LIGATION OF COMPETING BRANCHES X5 OF ARTERIOVENOUS FISTULA- RIGHT ARM;  Surgeon: Serafina Mitchell, MD;  Location: [facility] OR;  Service: Vascular;  Laterality: Right;       Home Medications    Prior to Admission medications   Medication Sig Start Date End Date Taking? Authorizing Provider  calcium acetate (PHOSLO) 667 MG capsule Take 1,334 mg by mouth 3 (three) times daily with meals. 8a, 12p, 5p 02/11/13   Belkys A Regalado,  MD  cephALEXin (KEFLEX) 500 MG capsule Take 1 capsule (500 mg total) by mouth 2 (two) times daily. 07/06/16 07/13/16  Ivin Booty, MD  cetirizine-pseudoephedrine (ZYRTEC-D) 5-120 MG tablet Take 1 tablet by mouth 2 (two) times daily.    Historical Provider, MD  gabapentin (NEURONTIN) 300 MG capsule Take 300 mg by mouth 2 (two) times daily.     Historical Provider, MD  gemfibrozil (LOPID) 600 MG tablet Take 600 mg by mouth 2 (two) times daily before a meal.    Historical Provider, MD  HYDROcodone-acetaminophen (NORCO/VICODIN) 5-325 MG tablet Take 1 tablet by mouth every 4 (four) hours as needed for severe pain. 07/06/16   Ivin Booty, MD  insulin detemir (LEVEMIR) 100 UNIT/ML injection Inject 0.05 mLs (5 Units total) into the skin at bedtime. 02/11/13   Belkys A Regalado, MD  metoprolol succinate (TOPROL-XL) 50 MG 24 hr tablet Take 50 mg by mouth 2 (two) times daily. Take with or immediately following a meal.    Historical Provider, MD  omeprazole (PRILOSEC) 20 MG capsule Take 20 mg by mouth every morning.     Historical Provider, MD  risperiDONE (RISPERDAL) 0.25 MG tablet Take 0.25 mg by mouth at bedtime.    Historical Provider, MD  traZODone (DESYREL) 100 MG tablet Take 100 mg by mouth at bedtime.    Historical Provider, MD    Family History Family History  Problem Relation Age of Onset  . CAD Mother     Social History Social History  Substance Use Topics  . Smoking status: Former Smoker    Quit date: 10/28/2008  . Smokeless tobacco: Never Used  . Alcohol use No     Allergies   Review of patient's allergies indicates no known allergies.   Review of Systems Review of Systems  Constitutional: Negative for fever.  Respiratory: Negative for shortness of breath.   Cardiovascular: Negative for chest pain.  Gastrointestinal: Positive for abdominal pain (suprapubic, sharp, sudden onset, nonradiating, moderate severity ). Negative for nausea and vomiting.  Genitourinary: Positive for hematuria. Negative for dysuria and flank pain.  Hematological: Does not bruise/bleed easily.  All other systems reviewed and are negative.    Physical Exam Updated Vital Signs BP 102/65   Pulse 99   Temp 99.4 F (37.4 C) (Oral)   Resp 20   SpO2 94%   Physical Exam  Constitutional: He appears well-developed and well-nourished.  HENT:    Head: Normocephalic and atraumatic.  Eyes: Conjunctivae are normal.  Neck: Neck supple.  Cardiovascular: Normal rate and regular rhythm.   Pulmonary/Chest: Effort normal and breath sounds normal. No respiratory distress.  Abdominal: Soft. He exhibits no mass. There is tenderness (suprapubic, no r/g, no CVAT, no hernias). There is no guarding.  Musculoskeletal: He exhibits no edema.  Neurological: He is alert.  Skin: Skin is warm and dry.  Psychiatric: He has a normal mood and affect.  Nursing note and vitals reviewed.    ED Treatments / Results  Labs (all labs ordered are listed, but only abnormal results are displayed) Labs Reviewed  URINALYSIS, ROUTINE W REFLEX MICROSCOPIC (NOT AT [facility]) - Abnormal; Notable for the following:       Result Value   Color, Urine RED (*)    APPearance TURBID (*)    Hgb urine dipstick LARGE (*)    Protein, ur >300 (*)    Leukocytes, UA SMALL (*)    All other components within normal limits  BASIC METABOLIC PANEL - Abnormal; Notable for the following:  Potassium 3.4 (*)    Chloride 95 (*)    Creatinine, Ser 4.62 (*)    Calcium 8.3 (*)    GFR calc non Af Amer 14 (*)    GFR calc Af Amer 16 (*)    All other components within normal limits  CBC - Abnormal; Notable for the following:    WBC 15.6 (*)    RBC 4.21 (*)    All other components within normal limits  URINE MICROSCOPIC-ADD ON - Abnormal; Notable for the following:    Squamous Epithelial / LPF 0-5 (*)    Bacteria, UA MANY (*)    All other components within normal limits  URINE CULTURE  PROTIME-INR  I-STAT CG4 LACTIC ACID, ED    EKG  EKG Interpretation None       Radiology Ct Abdomen Pelvis Wo Contrast  Result Date: 07/06/2016 CLINICAL DATA:  Lower abdominal pain. EXAM: CT ABDOMEN AND PELVIS WITHOUT CONTRAST TECHNIQUE: Multidetector CT imaging of the abdomen and pelvis was performed following the standard protocol without IV contrast. COMPARISON:  09/06/2015 FINDINGS: Lower  chest: Atelectasis node scratch set subsegmental atelectasis noted in the bases. Hepatobiliary: No suspicious liver abnormalities identified. The gallbladder appears normal. There is no biliary dilatation. Pancreas: No inflammation or mass identified. Spleen: The spleen appears normal. Adrenals/Urinary Tract: Normal appearance of the adrenal glands. Stone within the right extra renal pelvis measures 1.1 cm, image 74 of series 5. No stone within the mid or distal right ureter. There is mild diffuse cortical atrophy involving the left kidney. No left-sided kidney mass or hydronephrosis. There is increased attenuation material within the bladder, image 93 of series 2 which may reflect blood products. Stomach/Bowel: The stomach is within normal limits. The small bowel loops have a normal course and caliber. No obstruction. Normal appearance of the colon. Vascular/Lymphatic: Normal appearance of the abdominal aorta. No enlarged retroperitoneal or mesenteric adenopathy. No enlarged pelvic or inguinal lymph nodes. Reproductive: Prostate is unremarkable. Other: There is no ascites or focal fluid collections within the abdomen or pelvis. Musculoskeletal: There is degenerative disc disease within the lower lumbar spine. No aggressive lytic or sclerotic bone lesion. IMPRESSION: 1. Kidney stone noted within the right extra renal pelvis. Additionally, there is high attenuation material within the urinary bladder which may reflect blood products consistent with the clinical history of hematuria. No hydronephrosis noted. Electronically Signed   By: Kerby Moors M.D.   On: 07/06/2016 20:33    Procedures Procedures (including critical care time)  Medications Ordered in ED Medications  cefTRIAXone (ROCEPHIN) 1 g in dextrose 5 % 50 mL IVPB (0 g Intravenous Stopped 07/06/16 2149)    Initial Impression / Assessment and Plan / ED Course  I have reviewed the triage vital signs and the nursing notes.  Pertinent labs & imaging  results that were available during my care of the patient were reviewed by me and considered in my medical decision making (see chart for details).  Clinical Course    50 year old male with history of cardiomyopathy, prior VT arrest status post ICD, end-stage renal disease on HD, A. Fib not anticoagulated, anemia, HTN, HLD presenting with sudden onset frank hematuria and sharp suprapubic abdominal pain during HD session today. He is afebrile with stable vitals. Pain is controlled. Pilar Plate hematuria continues. Bladder scan 71cc, cath UA obtained remarkable for frank hematuria with bacteria. No evidence of urosepsis- no fevers, leukocytosis, or lactic acidosis here. CT obtained- notable for large right renal pelvis stone and likely blood products  in bladder. Unclear source for blood- possibly 2/2 stone. Pt has not been seen by Urology to our knowledge.   Urine culture sent. Will tx with course of abx for possible hemorrhagic cystitis/UTI, pending urine culture. Feel he needs close f/u with Urology. Given patients limited English language skills, spoke with Hospitalist team who was able to reach the Urology scheduler, with a plan to call group home worker / friend Gerald Stabs at [phone] first thing Monday morning to facilitate follow up. I discussed return precautions with Gerald Stabs as well as with a family member who came to pick up pt at bedside. Sent home with norco and abx, f/u plan for Urology on Monday. Dc in stable condition.   Case discussed with Dr. Ashok Cordia who oversaw management of this patient.   Final Clinical Impressions(s) / ED Diagnoses   Final diagnoses:  UTI (lower urinary tract infection)  Hematuria    New Prescriptions New Prescriptions   CEPHALEXIN (KEFLEX) 500 MG CAPSULE    Take 1 capsule (500 mg total) by mouth 2 (two) times daily.   HYDROCODONE-ACETAMINOPHEN (NORCO/VICODIN) 5-325 MG TABLET    Take 1 tablet by mouth every 4 (four) hours as needed for severe pain.     Ivin Booty,  MD 07/06/16 2329    Lajean Saver, MD 07/07/16 [phone]

## 2016-07-06 NOTE — ED Notes (Signed)
Called facility and spoke with patricia at [facility]. Pt is going to stay with brother until the am and will be [facility] to [facility]

## 2016-07-06 NOTE — ED Notes (Signed)
Patient transported to CT 

## 2016-07-07 ENCOUNTER — Emergency Department (HOSPITAL_COMMUNITY)
Admission: EM | Admit: 2016-07-07 | Discharge: 2016-07-07 | Disposition: A | Discharge status: Home / Self Care | Payer: Medicare Other | Attending: Emergency Medicine | Admitting: Emergency Medicine

## 2016-07-07 ENCOUNTER — Encounter (HOSPITAL_COMMUNITY): Payer: Self-pay | Admitting: Emergency Medicine

## 2016-07-07 DIAGNOSIS — E1122 Type 2 diabetes mellitus with diabetic chronic kidney disease: Secondary | ICD-10-CM | POA: Diagnosis not present

## 2016-07-07 DIAGNOSIS — Z87891 Personal history of nicotine dependence: Secondary | ICD-10-CM | POA: Insufficient documentation

## 2016-07-07 DIAGNOSIS — Z992 Dependence on renal dialysis: Secondary | ICD-10-CM | POA: Insufficient documentation

## 2016-07-07 DIAGNOSIS — R319 Hematuria, unspecified: Secondary | ICD-10-CM

## 2016-07-07 DIAGNOSIS — N186 End stage renal disease: Secondary | ICD-10-CM | POA: Diagnosis not present

## 2016-07-07 DIAGNOSIS — N200 Calculus of kidney: Secondary | ICD-10-CM

## 2016-07-07 DIAGNOSIS — I120 Hypertensive chronic kidney disease with stage 5 chronic kidney disease or end stage renal disease: Secondary | ICD-10-CM | POA: Insufficient documentation

## 2016-07-07 DIAGNOSIS — Z95810 Presence of automatic (implantable) cardiac defibrillator: Secondary | ICD-10-CM | POA: Diagnosis not present

## 2016-07-07 DIAGNOSIS — Z794 Long term (current) use of insulin: Secondary | ICD-10-CM | POA: Diagnosis not present

## 2016-07-07 LAB — I-STAT CHEM 8, ED
BUN: 28 mg/dL — ABNORMAL HIGH (ref 6–20)
Calcium, Ion: 1.03 mmol/L — ABNORMAL LOW (ref 1.15–1.40)
Chloride: 94 mmol/L — ABNORMAL LOW (ref 101–111)
Creatinine, Ser: 6.6 mg/dL — ABNORMAL HIGH (ref 0.61–1.24)
Glucose, Bld: 126 mg/dL — ABNORMAL HIGH (ref 65–99)
HCT: 39 % (ref 39.0–52.0)
Hemoglobin: 13.3 g/dL (ref 13.0–17.0)
Potassium: 3.7 mmol/L (ref 3.5–5.1)
Sodium: 135 mmol/L (ref 135–145)
TCO2: 29 mmol/L (ref 0–100)

## 2016-07-07 MED ORDER — HYDROCODONE-ACETAMINOPHEN 5-325 MG PO TABS: 1.0000 | ORAL_TABLET | Freq: Four times a day (QID) | ORAL | 0 refills | Status: DC | PRN

## 2016-07-07 MED ORDER — CEPHALEXIN 250 MG PO CAPS
250.0000 mg | ORAL_CAPSULE | Freq: Two times a day (BID) | ORAL | 0 refills | Status: AC
Start: 2016-07-07 — End: 2016-07-14

## 2016-07-07 NOTE — ED Provider Notes (Signed)
[place] DEPT Provider Note   CSN: [id number] Arrival date & time: 07/07/16  0750     History   Chief Complaint Chief Complaint  Patient presents with  . Hematuria    HPI Ezriel Boffa is a 50 y.o. male.  Patient is a 50 year old male with a history of end-stage renal disease on dialysis, hypertension, diabetes, and prior episode of cardiac arrest with the current defibrillator in place. He was seen here yesterday for hematuria. He had a CT scan that showed a 1.1 cm stone in the right kidney and some possible signs of a UTI. He was started on Keflex. Arrangements were made for outpatient follow-up with urology. He currently lives in a group home. His brother brought him back today because he wasn't aware of the plan for follow-up. He states that the place and is continuing to have frequent urination and blood in his urine. There is no worsening pain. No fevers. No vomiting. He was given prescriptions yesterday for Keflex and hydrocodone for pain. They have not yet filled these prescriptions. He does dialysis Tuesday Thursday Saturday and completed his course of dialysis yesterday.    Hematuria  Associated symptoms include abdominal pain. Pertinent negatives include no chest pain, no headaches and no shortness of breath.    Past Medical History:  Diagnosis Date  . Anemia   . Atrial fibrillation   . Cardiomyopathy ([place]) 2010   Unclear Etiology: Last Echo 06/2009: EF 40-45%, severe Lateral & apical Hypokinesis (? CAD) ; Grade 2 DDysfxn. Mild conc LVH.   Marland Kitchen Cellulitis   . Chronic kidney disease   . Dyslipidemia   . Enterobacter sepsis ([place])   . Hyperlipidemia   . Hypertension   . S/P ICD (internal cardiac defibrillator) procedure 2010   VT Arrest (in [place])  . Schizophrenia [facility])     Patient Active Problem List   Diagnosis Date Noted  . Hyperlipidemia   . Sepsis ([place]) 11/03/2014  . IDDM (insulin dependent diabetes mellitus) ([place]) 11/03/2014  . Troponin level  elevated 12/22/2013    Class: Acute  . HCAP (healthcare-associated pneumonia) 12/21/2013  . Mechanical complication of other vascular device, implant, and graft 08/04/2013  . Other complications due to renal dialysis device, implant, and graft 08/04/2013  . End stage renal disease ([place]) 04/13/2013  . ARF (acute renal failure) ([place]) 02/05/2013  . Anemia in chronic kidney disease 02/05/2013  . Pneumonia 02/05/2013  . GI bleed 02/05/2013  . Metabolic acidosis 85/11/7739  . Wegener's disease, pulmonary ([place]) 02/05/2013  . PPD positive - completed 9 months INH (per Dr. Linus Salmons) 05/25/2012  . Other primary cardiomyopathies 05/07/2012  . Ventricular fibrillation ([place]) 05/07/2012    Class: History of  . Hypertension 05/07/2012  . Automatic implantable cardioverter-defibrillator in situ 05/07/2012    Past Surgical History:  Procedure Laterality Date  . AV FISTULA PLACEMENT Right 02/10/2013   Procedure: ARTERIOVENOUS (AV) FISTULA CREATION;  Surgeon: Rosetta Posner, MD;  Location: [facility] OR;  Service: Vascular;  Laterality: Right;  Right forearm radial/cephalic arterovenous fistula.   Marland Kitchen CARDIAC CATHETERIZATION  2010   [place]: In setting of VT arrest. Per brother's report, nonobstructive with no intervention  . CARDIAC DEFIBRILLATOR PLACEMENT  2010   [place]  . FISTULOGRAM Right 08/09/2013   Procedure: FISTULOGRAM;  Surgeon: Angelia Mould, MD;  Location: [facility] CATH LAB;  Service: Cardiovascular;  Laterality: Right;  . LIGATION OF COMPETING BRANCHES OF ARTERIOVENOUS FISTULA Right 08/13/2013   Procedure: LIGATION OF COMPETING BRANCHES X5 OF ARTERIOVENOUS FISTULA- RIGHT  ARM;  Surgeon: Serafina Mitchell, MD;  Location: [facility] OR;  Service: Vascular;  Laterality: Right;       Home Medications    Prior to Admission medications   Medication Sig Start Date End Date Taking? Authorizing Provider  calcium acetate (PHOSLO) 667 MG capsule Take 1,334 mg by mouth 3 (three) times daily with meals. 8a, 12p, 5p  02/11/13   Belkys A Regalado, MD  cephALEXin (KEFLEX) 250 MG capsule Take 1 capsule (250 mg total) by mouth 2 (two) times daily. 07/07/16 07/14/16  Malvin Johns, MD  cetirizine-pseudoephedrine (ZYRTEC-D) 5-120 MG tablet Take 1 tablet by mouth 2 (two) times daily.    Historical Provider, MD  gabapentin (NEURONTIN) 300 MG capsule Take 300 mg by mouth 2 (two) times daily.    Historical Provider, MD  gemfibrozil (LOPID) 600 MG tablet Take 600 mg by mouth 2 (two) times daily before a meal.    Historical Provider, MD  HYDROcodone-acetaminophen (NORCO/VICODIN) 5-325 MG tablet Take 1 tablet by mouth every 6 (six) hours as needed for severe pain. 07/07/16   Malvin Johns, MD  insulin detemir (LEVEMIR) 100 UNIT/ML injection Inject 0.05 mLs (5 Units total) into the skin at bedtime. 02/11/13   Belkys A Regalado, MD  metoprolol succinate (TOPROL-XL) 50 MG 24 hr tablet Take 50 mg by mouth 2 (two) times daily. Take with or immediately following a meal.    Historical Provider, MD  omeprazole (PRILOSEC) 20 MG capsule Take 20 mg by mouth every morning.     Historical Provider, MD  risperiDONE (RISPERDAL) 0.25 MG tablet Take 0.25 mg by mouth at bedtime.    Historical Provider, MD  traZODone (DESYREL) 100 MG tablet Take 100 mg by mouth at bedtime.    Historical Provider, MD    Family History Family History  Problem Relation Age of Onset  . CAD Mother     Social History Social History  Substance Use Topics  . Smoking status: Former Smoker    Quit date: 10/28/2008  . Smokeless tobacco: Never Used  . Alcohol use No     Allergies   Review of patient's allergies indicates no known allergies.   Review of Systems Review of Systems  Constitutional: Negative for chills, diaphoresis, fatigue and fever.  HENT: Negative for congestion, rhinorrhea and sneezing.   Eyes: Negative.   Respiratory: Negative for cough, chest tightness and shortness of breath.   Cardiovascular: Negative for chest pain and leg swelling.    Gastrointestinal: Positive for abdominal pain. Negative for blood in stool, diarrhea, nausea and vomiting.  Genitourinary: Positive for hematuria. Negative for difficulty urinating, flank pain and frequency.  Musculoskeletal: Negative for arthralgias and back pain.  Skin: Negative for rash.  Neurological: Negative for dizziness, speech difficulty, weakness, numbness and headaches.     Physical Exam Updated Vital Signs BP 114/76 (BP Location: Left Arm)   Pulse 113   Temp 98.5 F (36.9 C) (Oral)   Resp 17   Ht 5\' 8"  (1.727 m)   Wt 185 lb (83.9 kg)   SpO2 97%   BMI 28.13 kg/m   Physical Exam  Constitutional: He is oriented to person, place, and time. He appears well-developed and well-nourished.  HENT:  Head: Normocephalic and atraumatic.  Eyes: Pupils are equal, round, and reactive to light.  Neck: Normal range of motion. Neck supple.  Cardiovascular: Normal rate, regular rhythm and normal heart sounds.   Pulmonary/Chest: Effort normal and breath sounds normal. No respiratory distress. He has no wheezes. He has  no rales. He exhibits no tenderness.  Abdominal: Soft. Bowel sounds are normal. There is tenderness (Mild suprapubic tenderness). There is no rebound and no guarding.  Musculoskeletal: Normal range of motion. He exhibits no edema.  Lymphadenopathy:    He has no cervical adenopathy.  Neurological: He is alert and oriented to person, place, and time.  Skin: Skin is warm and dry. No rash noted.  Psychiatric: He has a normal mood and affect.     ED Treatments / Results  Labs (all labs ordered are listed, but only abnormal results are displayed) Labs Reviewed  I-STAT CHEM 8, ED - Abnormal; Notable for the following:       Result Value   Chloride 94 (*)    BUN 28 (*)    Creatinine, Ser 6.60 (*)    Glucose, Bld 126 (*)    Calcium, Ion 1.03 (*)    All other components within normal limits    EKG  EKG Interpretation None       Radiology Ct Abdomen Pelvis Wo  Contrast  Result Date: 07/06/2016 CLINICAL DATA:  Lower abdominal pain. EXAM: CT ABDOMEN AND PELVIS WITHOUT CONTRAST TECHNIQUE: Multidetector CT imaging of the abdomen and pelvis was performed following the standard protocol without IV contrast. COMPARISON:  09/06/2015 FINDINGS: Lower chest: Atelectasis node scratch set subsegmental atelectasis noted in the bases. Hepatobiliary: No suspicious liver abnormalities identified. The gallbladder appears normal. There is no biliary dilatation. Pancreas: No inflammation or mass identified. Spleen: The spleen appears normal. Adrenals/Urinary Tract: Normal appearance of the adrenal glands. Stone within the right extra renal pelvis measures 1.1 cm, image 74 of series 5. No stone within the mid or distal right ureter. There is mild diffuse cortical atrophy involving the left kidney. No left-sided kidney mass or hydronephrosis. There is increased attenuation material within the bladder, image 93 of series 2 which may reflect blood products. Stomach/Bowel: The stomach is within normal limits. The small bowel loops have a normal course and caliber. No obstruction. Normal appearance of the colon. Vascular/Lymphatic: Normal appearance of the abdominal aorta. No enlarged retroperitoneal or mesenteric adenopathy. No enlarged pelvic or inguinal lymph nodes. Reproductive: Prostate is unremarkable. Other: There is no ascites or focal fluid collections within the abdomen or pelvis. Musculoskeletal: There is degenerative disc disease within the lower lumbar spine. No aggressive lytic or sclerotic bone lesion. IMPRESSION: 1. Kidney stone noted within the right extra renal pelvis. Additionally, there is high attenuation material within the urinary bladder which may reflect blood products consistent with the clinical history of hematuria. No hydronephrosis noted. Electronically Signed   By: Kerby Moors M.D.   On: 07/06/2016 20:33    Procedures Procedures (including critical care  time)  Medications Ordered in ED Medications - No data to display   Initial Impression / Assessment and Plan / ED Course  I have reviewed the triage vital signs and the nursing notes.  Pertinent labs & imaging results that were available during my care of the patient were reviewed by me and considered in my medical decision making (see chart for details).  Clinical Course    Patient has a right renal stone with hematuria. His urine is clear with some small blood clots. He is able to urinate. A bladder scan did not show evidence of obstruction. He is otherwise well-appearing. The nephrology PA asked [place] to change his Keflex from 500 twice a day to 250 twice a day. I gave him new prescriptions for this. I did advise the brother  that the social worker is supposed to be contacting Gerald Stabs, the group home supervisor regarding a follow-up appointment with urology. I did give the brother the contact information for urology as a backup. Return precautions were given.  Final Clinical Impressions(s) / ED Diagnoses   Final diagnoses:  Hematuria  Renal stone    New Prescriptions Current Discharge Medication List       Malvin Johns, MD 07/07/16 [phone]

## 2016-07-07 NOTE — ED Triage Notes (Signed)
Brother stated, he was on dialysis and they call me and say he is passing blood in urine started yesterday.

## 2016-07-08 DIAGNOSIS — E1140 Type 2 diabetes mellitus with diabetic neuropathy, unspecified: Secondary | ICD-10-CM | POA: Diagnosis not present

## 2016-07-08 DIAGNOSIS — F209 Schizophrenia, unspecified: Secondary | ICD-10-CM | POA: Diagnosis not present

## 2016-07-08 DIAGNOSIS — N186 End stage renal disease: Secondary | ICD-10-CM | POA: Diagnosis not present

## 2016-07-08 DIAGNOSIS — I10 Essential (primary) hypertension: Secondary | ICD-10-CM | POA: Diagnosis not present

## 2016-07-08 DIAGNOSIS — K219 Gastro-esophageal reflux disease without esophagitis: Secondary | ICD-10-CM | POA: Diagnosis not present

## 2016-07-08 DIAGNOSIS — E785 Hyperlipidemia, unspecified: Secondary | ICD-10-CM | POA: Diagnosis not present

## 2016-07-08 DIAGNOSIS — E1122 Type 2 diabetes mellitus with diabetic chronic kidney disease: Secondary | ICD-10-CM | POA: Diagnosis not present

## 2016-07-09 DIAGNOSIS — E119 Type 2 diabetes mellitus without complications: Secondary | ICD-10-CM | POA: Diagnosis not present

## 2016-07-09 DIAGNOSIS — N2581 Secondary hyperparathyroidism of renal origin: Secondary | ICD-10-CM | POA: Diagnosis not present

## 2016-07-09 DIAGNOSIS — Z23 Encounter for immunization: Secondary | ICD-10-CM | POA: Diagnosis not present

## 2016-07-09 DIAGNOSIS — N186 End stage renal disease: Secondary | ICD-10-CM | POA: Diagnosis not present

## 2016-07-09 DIAGNOSIS — D509 Iron deficiency anemia, unspecified: Secondary | ICD-10-CM | POA: Diagnosis not present

## 2016-07-09 LAB — URINE CULTURE: Culture: >=100000 — AB

## 2016-07-10 ENCOUNTER — Telehealth (HOSPITAL_BASED_OUTPATIENT_CLINIC_OR_DEPARTMENT_OTHER): Payer: Self-pay

## 2016-07-10 NOTE — Telephone Encounter (Signed)
Post ED Visit - Positive Culture Follow-up  Culture report reviewed by antimicrobial stewardship pharmacist:  []  Elenor Quinones, Pharm.D. []  Heide Guile, Pharm.D., BCPS []  Parks Neptune, Pharm.D. []  Alycia Rossetti, Pharm.D., BCPS []  [place], [place].D., BCPS, AAHIVP []  Legrand Como, Pharm.D., BCPS, AAHIVP []  Milus Glazier, Pharm.D. []  Rob Evette Doffing, Pharm.DUvaldo Bristle Pharm D Positive urine culture Treated with Cephalexin, organism sensitive to the same and no further patient follow-up is required at this time.  Genia Del 07/10/2016, 8:37 AM

## 2016-07-11 DIAGNOSIS — N2581 Secondary hyperparathyroidism of renal origin: Secondary | ICD-10-CM | POA: Diagnosis not present

## 2016-07-11 DIAGNOSIS — Z23 Encounter for immunization: Secondary | ICD-10-CM | POA: Diagnosis not present

## 2016-07-11 DIAGNOSIS — D509 Iron deficiency anemia, unspecified: Secondary | ICD-10-CM | POA: Diagnosis not present

## 2016-07-11 DIAGNOSIS — N186 End stage renal disease: Secondary | ICD-10-CM | POA: Diagnosis not present

## 2016-07-11 DIAGNOSIS — E119 Type 2 diabetes mellitus without complications: Secondary | ICD-10-CM | POA: Diagnosis not present

## 2016-07-11 DIAGNOSIS — F250 Schizoaffective disorder, bipolar type: Secondary | ICD-10-CM | POA: Diagnosis not present

## 2016-07-13 DIAGNOSIS — E119 Type 2 diabetes mellitus without complications: Secondary | ICD-10-CM | POA: Diagnosis not present

## 2016-07-13 DIAGNOSIS — D509 Iron deficiency anemia, unspecified: Secondary | ICD-10-CM | POA: Diagnosis not present

## 2016-07-13 DIAGNOSIS — Z23 Encounter for immunization: Secondary | ICD-10-CM | POA: Diagnosis not present

## 2016-07-13 DIAGNOSIS — N186 End stage renal disease: Secondary | ICD-10-CM | POA: Diagnosis not present

## 2016-07-13 DIAGNOSIS — N2581 Secondary hyperparathyroidism of renal origin: Secondary | ICD-10-CM | POA: Diagnosis not present

## 2016-07-16 DIAGNOSIS — Z23 Encounter for immunization: Secondary | ICD-10-CM | POA: Diagnosis not present

## 2016-07-16 DIAGNOSIS — D509 Iron deficiency anemia, unspecified: Secondary | ICD-10-CM | POA: Diagnosis not present

## 2016-07-16 DIAGNOSIS — N186 End stage renal disease: Secondary | ICD-10-CM | POA: Diagnosis not present

## 2016-07-16 DIAGNOSIS — N2581 Secondary hyperparathyroidism of renal origin: Secondary | ICD-10-CM | POA: Diagnosis not present

## 2016-07-16 DIAGNOSIS — E119 Type 2 diabetes mellitus without complications: Secondary | ICD-10-CM | POA: Diagnosis not present

## 2016-07-18 DIAGNOSIS — Z23 Encounter for immunization: Secondary | ICD-10-CM | POA: Diagnosis not present

## 2016-07-18 DIAGNOSIS — E119 Type 2 diabetes mellitus without complications: Secondary | ICD-10-CM | POA: Diagnosis not present

## 2016-07-18 DIAGNOSIS — N186 End stage renal disease: Secondary | ICD-10-CM | POA: Diagnosis not present

## 2016-07-18 DIAGNOSIS — N2581 Secondary hyperparathyroidism of renal origin: Secondary | ICD-10-CM | POA: Diagnosis not present

## 2016-07-18 DIAGNOSIS — D509 Iron deficiency anemia, unspecified: Secondary | ICD-10-CM | POA: Diagnosis not present

## 2016-07-20 DIAGNOSIS — E119 Type 2 diabetes mellitus without complications: Secondary | ICD-10-CM | POA: Diagnosis not present

## 2016-07-20 DIAGNOSIS — D509 Iron deficiency anemia, unspecified: Secondary | ICD-10-CM | POA: Diagnosis not present

## 2016-07-20 DIAGNOSIS — N2581 Secondary hyperparathyroidism of renal origin: Secondary | ICD-10-CM | POA: Diagnosis not present

## 2016-07-20 DIAGNOSIS — Z23 Encounter for immunization: Secondary | ICD-10-CM | POA: Diagnosis not present

## 2016-07-20 DIAGNOSIS — N186 End stage renal disease: Secondary | ICD-10-CM | POA: Diagnosis not present

## 2016-07-23 DIAGNOSIS — D509 Iron deficiency anemia, unspecified: Secondary | ICD-10-CM | POA: Diagnosis not present

## 2016-07-23 DIAGNOSIS — N186 End stage renal disease: Secondary | ICD-10-CM | POA: Diagnosis not present

## 2016-07-23 DIAGNOSIS — Z23 Encounter for immunization: Secondary | ICD-10-CM | POA: Diagnosis not present

## 2016-07-23 DIAGNOSIS — N2581 Secondary hyperparathyroidism of renal origin: Secondary | ICD-10-CM | POA: Diagnosis not present

## 2016-07-23 DIAGNOSIS — E119 Type 2 diabetes mellitus without complications: Secondary | ICD-10-CM | POA: Diagnosis not present

## 2016-07-25 DIAGNOSIS — D509 Iron deficiency anemia, unspecified: Secondary | ICD-10-CM | POA: Diagnosis not present

## 2016-07-25 DIAGNOSIS — N186 End stage renal disease: Secondary | ICD-10-CM | POA: Diagnosis not present

## 2016-07-25 DIAGNOSIS — E119 Type 2 diabetes mellitus without complications: Secondary | ICD-10-CM | POA: Diagnosis not present

## 2016-07-25 DIAGNOSIS — N2581 Secondary hyperparathyroidism of renal origin: Secondary | ICD-10-CM | POA: Diagnosis not present

## 2016-07-25 DIAGNOSIS — Z23 Encounter for immunization: Secondary | ICD-10-CM | POA: Diagnosis not present

## 2016-07-27 DIAGNOSIS — N186 End stage renal disease: Secondary | ICD-10-CM | POA: Diagnosis not present

## 2016-07-27 DIAGNOSIS — E119 Type 2 diabetes mellitus without complications: Secondary | ICD-10-CM | POA: Diagnosis not present

## 2016-07-27 DIAGNOSIS — Z23 Encounter for immunization: Secondary | ICD-10-CM | POA: Diagnosis not present

## 2016-07-27 DIAGNOSIS — D509 Iron deficiency anemia, unspecified: Secondary | ICD-10-CM | POA: Diagnosis not present

## 2016-07-27 DIAGNOSIS — N032 Chronic nephritic syndrome with diffuse membranous glomerulonephritis: Secondary | ICD-10-CM | POA: Diagnosis not present

## 2016-07-27 DIAGNOSIS — N2581 Secondary hyperparathyroidism of renal origin: Secondary | ICD-10-CM | POA: Diagnosis not present

## 2016-07-27 DIAGNOSIS — Z992 Dependence on renal dialysis: Secondary | ICD-10-CM | POA: Diagnosis not present

## 2016-07-30 DIAGNOSIS — D509 Iron deficiency anemia, unspecified: Secondary | ICD-10-CM | POA: Diagnosis not present

## 2016-07-30 DIAGNOSIS — E119 Type 2 diabetes mellitus without complications: Secondary | ICD-10-CM | POA: Diagnosis not present

## 2016-07-30 DIAGNOSIS — N186 End stage renal disease: Secondary | ICD-10-CM | POA: Diagnosis not present

## 2016-07-30 DIAGNOSIS — N2581 Secondary hyperparathyroidism of renal origin: Secondary | ICD-10-CM | POA: Diagnosis not present

## 2016-08-01 DIAGNOSIS — D509 Iron deficiency anemia, unspecified: Secondary | ICD-10-CM | POA: Diagnosis not present

## 2016-08-01 DIAGNOSIS — N2581 Secondary hyperparathyroidism of renal origin: Secondary | ICD-10-CM | POA: Diagnosis not present

## 2016-08-01 DIAGNOSIS — N186 End stage renal disease: Secondary | ICD-10-CM | POA: Diagnosis not present

## 2016-08-01 DIAGNOSIS — E119 Type 2 diabetes mellitus without complications: Secondary | ICD-10-CM | POA: Diagnosis not present

## 2016-08-03 DIAGNOSIS — N186 End stage renal disease: Secondary | ICD-10-CM | POA: Diagnosis not present

## 2016-08-03 DIAGNOSIS — E119 Type 2 diabetes mellitus without complications: Secondary | ICD-10-CM | POA: Diagnosis not present

## 2016-08-03 DIAGNOSIS — D509 Iron deficiency anemia, unspecified: Secondary | ICD-10-CM | POA: Diagnosis not present

## 2016-08-03 DIAGNOSIS — N2581 Secondary hyperparathyroidism of renal origin: Secondary | ICD-10-CM | POA: Diagnosis not present

## 2016-08-06 DIAGNOSIS — E119 Type 2 diabetes mellitus without complications: Secondary | ICD-10-CM | POA: Diagnosis not present

## 2016-08-06 DIAGNOSIS — D509 Iron deficiency anemia, unspecified: Secondary | ICD-10-CM | POA: Diagnosis not present

## 2016-08-06 DIAGNOSIS — N2581 Secondary hyperparathyroidism of renal origin: Secondary | ICD-10-CM | POA: Diagnosis not present

## 2016-08-06 DIAGNOSIS — N186 End stage renal disease: Secondary | ICD-10-CM | POA: Diagnosis not present

## 2016-08-08 DIAGNOSIS — N2581 Secondary hyperparathyroidism of renal origin: Secondary | ICD-10-CM | POA: Diagnosis not present

## 2016-08-08 DIAGNOSIS — N186 End stage renal disease: Secondary | ICD-10-CM | POA: Diagnosis not present

## 2016-08-08 DIAGNOSIS — D509 Iron deficiency anemia, unspecified: Secondary | ICD-10-CM | POA: Diagnosis not present

## 2016-08-08 DIAGNOSIS — E1129 Type 2 diabetes mellitus with other diabetic kidney complication: Secondary | ICD-10-CM | POA: Diagnosis not present

## 2016-08-08 DIAGNOSIS — E119 Type 2 diabetes mellitus without complications: Secondary | ICD-10-CM | POA: Diagnosis not present

## 2016-08-10 DIAGNOSIS — N2581 Secondary hyperparathyroidism of renal origin: Secondary | ICD-10-CM | POA: Diagnosis not present

## 2016-08-10 DIAGNOSIS — E119 Type 2 diabetes mellitus without complications: Secondary | ICD-10-CM | POA: Diagnosis not present

## 2016-08-10 DIAGNOSIS — D509 Iron deficiency anemia, unspecified: Secondary | ICD-10-CM | POA: Diagnosis not present

## 2016-08-10 DIAGNOSIS — N186 End stage renal disease: Secondary | ICD-10-CM | POA: Diagnosis not present

## 2016-08-13 DIAGNOSIS — N2581 Secondary hyperparathyroidism of renal origin: Secondary | ICD-10-CM | POA: Diagnosis not present

## 2016-08-13 DIAGNOSIS — N186 End stage renal disease: Secondary | ICD-10-CM | POA: Diagnosis not present

## 2016-08-13 DIAGNOSIS — D509 Iron deficiency anemia, unspecified: Secondary | ICD-10-CM | POA: Diagnosis not present

## 2016-08-13 DIAGNOSIS — E119 Type 2 diabetes mellitus without complications: Secondary | ICD-10-CM | POA: Diagnosis not present

## 2016-08-15 DIAGNOSIS — E119 Type 2 diabetes mellitus without complications: Secondary | ICD-10-CM | POA: Diagnosis not present

## 2016-08-15 DIAGNOSIS — N186 End stage renal disease: Secondary | ICD-10-CM | POA: Diagnosis not present

## 2016-08-15 DIAGNOSIS — N2581 Secondary hyperparathyroidism of renal origin: Secondary | ICD-10-CM | POA: Diagnosis not present

## 2016-08-15 DIAGNOSIS — D509 Iron deficiency anemia, unspecified: Secondary | ICD-10-CM | POA: Diagnosis not present

## 2016-08-17 DIAGNOSIS — E119 Type 2 diabetes mellitus without complications: Secondary | ICD-10-CM | POA: Diagnosis not present

## 2016-08-17 DIAGNOSIS — D509 Iron deficiency anemia, unspecified: Secondary | ICD-10-CM | POA: Diagnosis not present

## 2016-08-17 DIAGNOSIS — N2581 Secondary hyperparathyroidism of renal origin: Secondary | ICD-10-CM | POA: Diagnosis not present

## 2016-08-17 DIAGNOSIS — N186 End stage renal disease: Secondary | ICD-10-CM | POA: Diagnosis not present

## 2016-08-20 DIAGNOSIS — D509 Iron deficiency anemia, unspecified: Secondary | ICD-10-CM | POA: Diagnosis not present

## 2016-08-20 DIAGNOSIS — E119 Type 2 diabetes mellitus without complications: Secondary | ICD-10-CM | POA: Diagnosis not present

## 2016-08-20 DIAGNOSIS — N2581 Secondary hyperparathyroidism of renal origin: Secondary | ICD-10-CM | POA: Diagnosis not present

## 2016-08-20 DIAGNOSIS — N186 End stage renal disease: Secondary | ICD-10-CM | POA: Diagnosis not present

## 2016-08-22 DIAGNOSIS — N2581 Secondary hyperparathyroidism of renal origin: Secondary | ICD-10-CM | POA: Diagnosis not present

## 2016-08-22 DIAGNOSIS — E119 Type 2 diabetes mellitus without complications: Secondary | ICD-10-CM | POA: Diagnosis not present

## 2016-08-22 DIAGNOSIS — N186 End stage renal disease: Secondary | ICD-10-CM | POA: Diagnosis not present

## 2016-08-22 DIAGNOSIS — D509 Iron deficiency anemia, unspecified: Secondary | ICD-10-CM | POA: Diagnosis not present

## 2016-08-24 DIAGNOSIS — D509 Iron deficiency anemia, unspecified: Secondary | ICD-10-CM | POA: Diagnosis not present

## 2016-08-24 DIAGNOSIS — N2581 Secondary hyperparathyroidism of renal origin: Secondary | ICD-10-CM | POA: Diagnosis not present

## 2016-08-24 DIAGNOSIS — N186 End stage renal disease: Secondary | ICD-10-CM | POA: Diagnosis not present

## 2016-08-24 DIAGNOSIS — E119 Type 2 diabetes mellitus without complications: Secondary | ICD-10-CM | POA: Diagnosis not present

## 2016-08-27 ENCOUNTER — Telehealth: Payer: Self-pay | Admitting: Cardiology

## 2016-08-27 ENCOUNTER — Encounter: Payer: Medicare Other | Admitting: *Deleted

## 2016-08-27 DIAGNOSIS — N032 Chronic nephritic syndrome with diffuse membranous glomerulonephritis: Secondary | ICD-10-CM | POA: Diagnosis not present

## 2016-08-27 DIAGNOSIS — N2581 Secondary hyperparathyroidism of renal origin: Secondary | ICD-10-CM | POA: Diagnosis not present

## 2016-08-27 DIAGNOSIS — E119 Type 2 diabetes mellitus without complications: Secondary | ICD-10-CM | POA: Diagnosis not present

## 2016-08-27 DIAGNOSIS — N186 End stage renal disease: Secondary | ICD-10-CM | POA: Diagnosis not present

## 2016-08-27 DIAGNOSIS — D509 Iron deficiency anemia, unspecified: Secondary | ICD-10-CM | POA: Diagnosis not present

## 2016-08-27 DIAGNOSIS — Z992 Dependence on renal dialysis: Secondary | ICD-10-CM | POA: Diagnosis not present

## 2016-08-27 NOTE — Telephone Encounter (Signed)
Confirmed remote transmission w/ pt family member.   

## 2016-08-29 DIAGNOSIS — N186 End stage renal disease: Secondary | ICD-10-CM | POA: Diagnosis not present

## 2016-08-29 DIAGNOSIS — N2581 Secondary hyperparathyroidism of renal origin: Secondary | ICD-10-CM | POA: Diagnosis not present

## 2016-08-29 DIAGNOSIS — E119 Type 2 diabetes mellitus without complications: Secondary | ICD-10-CM | POA: Diagnosis not present

## 2016-08-29 DIAGNOSIS — D509 Iron deficiency anemia, unspecified: Secondary | ICD-10-CM | POA: Diagnosis not present

## 2016-08-30 ENCOUNTER — Encounter: Payer: Self-pay | Admitting: Cardiology

## 2016-08-31 DIAGNOSIS — D509 Iron deficiency anemia, unspecified: Secondary | ICD-10-CM | POA: Diagnosis not present

## 2016-08-31 DIAGNOSIS — N2581 Secondary hyperparathyroidism of renal origin: Secondary | ICD-10-CM | POA: Diagnosis not present

## 2016-08-31 DIAGNOSIS — N186 End stage renal disease: Secondary | ICD-10-CM | POA: Diagnosis not present

## 2016-08-31 DIAGNOSIS — E119 Type 2 diabetes mellitus without complications: Secondary | ICD-10-CM | POA: Diagnosis not present

## 2016-09-03 DIAGNOSIS — N186 End stage renal disease: Secondary | ICD-10-CM | POA: Diagnosis not present

## 2016-09-03 DIAGNOSIS — N2581 Secondary hyperparathyroidism of renal origin: Secondary | ICD-10-CM | POA: Diagnosis not present

## 2016-09-03 DIAGNOSIS — D509 Iron deficiency anemia, unspecified: Secondary | ICD-10-CM | POA: Diagnosis not present

## 2016-09-03 DIAGNOSIS — E119 Type 2 diabetes mellitus without complications: Secondary | ICD-10-CM | POA: Diagnosis not present

## 2016-09-05 DIAGNOSIS — N186 End stage renal disease: Secondary | ICD-10-CM | POA: Diagnosis not present

## 2016-09-05 DIAGNOSIS — N2581 Secondary hyperparathyroidism of renal origin: Secondary | ICD-10-CM | POA: Diagnosis not present

## 2016-09-05 DIAGNOSIS — D509 Iron deficiency anemia, unspecified: Secondary | ICD-10-CM | POA: Diagnosis not present

## 2016-09-05 DIAGNOSIS — E119 Type 2 diabetes mellitus without complications: Secondary | ICD-10-CM | POA: Diagnosis not present

## 2016-09-07 DIAGNOSIS — E119 Type 2 diabetes mellitus without complications: Secondary | ICD-10-CM | POA: Diagnosis not present

## 2016-09-07 DIAGNOSIS — D509 Iron deficiency anemia, unspecified: Secondary | ICD-10-CM | POA: Diagnosis not present

## 2016-09-07 DIAGNOSIS — N186 End stage renal disease: Secondary | ICD-10-CM | POA: Diagnosis not present

## 2016-09-07 DIAGNOSIS — N2581 Secondary hyperparathyroidism of renal origin: Secondary | ICD-10-CM | POA: Diagnosis not present

## 2016-09-10 DIAGNOSIS — D509 Iron deficiency anemia, unspecified: Secondary | ICD-10-CM | POA: Diagnosis not present

## 2016-09-10 DIAGNOSIS — E119 Type 2 diabetes mellitus without complications: Secondary | ICD-10-CM | POA: Diagnosis not present

## 2016-09-10 DIAGNOSIS — N186 End stage renal disease: Secondary | ICD-10-CM | POA: Diagnosis not present

## 2016-09-10 DIAGNOSIS — N2581 Secondary hyperparathyroidism of renal origin: Secondary | ICD-10-CM | POA: Diagnosis not present

## 2016-09-12 DIAGNOSIS — E119 Type 2 diabetes mellitus without complications: Secondary | ICD-10-CM | POA: Diagnosis not present

## 2016-09-12 DIAGNOSIS — D509 Iron deficiency anemia, unspecified: Secondary | ICD-10-CM | POA: Diagnosis not present

## 2016-09-12 DIAGNOSIS — N2581 Secondary hyperparathyroidism of renal origin: Secondary | ICD-10-CM | POA: Diagnosis not present

## 2016-09-12 DIAGNOSIS — N186 End stage renal disease: Secondary | ICD-10-CM | POA: Diagnosis not present

## 2016-09-14 DIAGNOSIS — N186 End stage renal disease: Secondary | ICD-10-CM | POA: Diagnosis not present

## 2016-09-14 DIAGNOSIS — N2581 Secondary hyperparathyroidism of renal origin: Secondary | ICD-10-CM | POA: Diagnosis not present

## 2016-09-14 DIAGNOSIS — D509 Iron deficiency anemia, unspecified: Secondary | ICD-10-CM | POA: Diagnosis not present

## 2016-09-14 DIAGNOSIS — E119 Type 2 diabetes mellitus without complications: Secondary | ICD-10-CM | POA: Diagnosis not present

## 2016-09-16 DIAGNOSIS — N186 End stage renal disease: Secondary | ICD-10-CM | POA: Diagnosis not present

## 2016-09-16 DIAGNOSIS — D509 Iron deficiency anemia, unspecified: Secondary | ICD-10-CM | POA: Diagnosis not present

## 2016-09-16 DIAGNOSIS — N2581 Secondary hyperparathyroidism of renal origin: Secondary | ICD-10-CM | POA: Diagnosis not present

## 2016-09-16 DIAGNOSIS — E119 Type 2 diabetes mellitus without complications: Secondary | ICD-10-CM | POA: Diagnosis not present

## 2016-09-18 DIAGNOSIS — D509 Iron deficiency anemia, unspecified: Secondary | ICD-10-CM | POA: Diagnosis not present

## 2016-09-18 DIAGNOSIS — E119 Type 2 diabetes mellitus without complications: Secondary | ICD-10-CM | POA: Diagnosis not present

## 2016-09-18 DIAGNOSIS — N2581 Secondary hyperparathyroidism of renal origin: Secondary | ICD-10-CM | POA: Diagnosis not present

## 2016-09-18 DIAGNOSIS — N186 End stage renal disease: Secondary | ICD-10-CM | POA: Diagnosis not present

## 2016-09-21 DIAGNOSIS — E119 Type 2 diabetes mellitus without complications: Secondary | ICD-10-CM | POA: Diagnosis not present

## 2016-09-21 DIAGNOSIS — N2581 Secondary hyperparathyroidism of renal origin: Secondary | ICD-10-CM | POA: Diagnosis not present

## 2016-09-21 DIAGNOSIS — D509 Iron deficiency anemia, unspecified: Secondary | ICD-10-CM | POA: Diagnosis not present

## 2016-09-21 DIAGNOSIS — N186 End stage renal disease: Secondary | ICD-10-CM | POA: Diagnosis not present

## 2016-09-24 DIAGNOSIS — D509 Iron deficiency anemia, unspecified: Secondary | ICD-10-CM | POA: Diagnosis not present

## 2016-09-24 DIAGNOSIS — N186 End stage renal disease: Secondary | ICD-10-CM | POA: Diagnosis not present

## 2016-09-24 DIAGNOSIS — E119 Type 2 diabetes mellitus without complications: Secondary | ICD-10-CM | POA: Diagnosis not present

## 2016-09-24 DIAGNOSIS — N2581 Secondary hyperparathyroidism of renal origin: Secondary | ICD-10-CM | POA: Diagnosis not present

## 2016-09-26 DIAGNOSIS — E119 Type 2 diabetes mellitus without complications: Secondary | ICD-10-CM | POA: Diagnosis not present

## 2016-09-26 DIAGNOSIS — Z992 Dependence on renal dialysis: Secondary | ICD-10-CM | POA: Diagnosis not present

## 2016-09-26 DIAGNOSIS — D509 Iron deficiency anemia, unspecified: Secondary | ICD-10-CM | POA: Diagnosis not present

## 2016-09-26 DIAGNOSIS — N032 Chronic nephritic syndrome with diffuse membranous glomerulonephritis: Secondary | ICD-10-CM | POA: Diagnosis not present

## 2016-09-26 DIAGNOSIS — N2581 Secondary hyperparathyroidism of renal origin: Secondary | ICD-10-CM | POA: Diagnosis not present

## 2016-09-26 DIAGNOSIS — N186 End stage renal disease: Secondary | ICD-10-CM | POA: Diagnosis not present

## 2016-09-28 DIAGNOSIS — N186 End stage renal disease: Secondary | ICD-10-CM | POA: Diagnosis not present

## 2016-09-28 DIAGNOSIS — D509 Iron deficiency anemia, unspecified: Secondary | ICD-10-CM | POA: Diagnosis not present

## 2016-09-28 DIAGNOSIS — E119 Type 2 diabetes mellitus without complications: Secondary | ICD-10-CM | POA: Diagnosis not present

## 2016-09-28 DIAGNOSIS — N2581 Secondary hyperparathyroidism of renal origin: Secondary | ICD-10-CM | POA: Diagnosis not present

## 2016-09-30 DIAGNOSIS — F250 Schizoaffective disorder, bipolar type: Secondary | ICD-10-CM | POA: Diagnosis not present

## 2016-10-01 DIAGNOSIS — N186 End stage renal disease: Secondary | ICD-10-CM | POA: Diagnosis not present

## 2016-10-01 DIAGNOSIS — D509 Iron deficiency anemia, unspecified: Secondary | ICD-10-CM | POA: Diagnosis not present

## 2016-10-01 DIAGNOSIS — E119 Type 2 diabetes mellitus without complications: Secondary | ICD-10-CM | POA: Diagnosis not present

## 2016-10-01 DIAGNOSIS — N2581 Secondary hyperparathyroidism of renal origin: Secondary | ICD-10-CM | POA: Diagnosis not present

## 2016-10-03 DIAGNOSIS — N186 End stage renal disease: Secondary | ICD-10-CM | POA: Diagnosis not present

## 2016-10-03 DIAGNOSIS — N2581 Secondary hyperparathyroidism of renal origin: Secondary | ICD-10-CM | POA: Diagnosis not present

## 2016-10-03 DIAGNOSIS — E119 Type 2 diabetes mellitus without complications: Secondary | ICD-10-CM | POA: Diagnosis not present

## 2016-10-03 DIAGNOSIS — D509 Iron deficiency anemia, unspecified: Secondary | ICD-10-CM | POA: Diagnosis not present

## 2016-10-05 DIAGNOSIS — N186 End stage renal disease: Secondary | ICD-10-CM | POA: Diagnosis not present

## 2016-10-05 DIAGNOSIS — N2581 Secondary hyperparathyroidism of renal origin: Secondary | ICD-10-CM | POA: Diagnosis not present

## 2016-10-05 DIAGNOSIS — D509 Iron deficiency anemia, unspecified: Secondary | ICD-10-CM | POA: Diagnosis not present

## 2016-10-05 DIAGNOSIS — E119 Type 2 diabetes mellitus without complications: Secondary | ICD-10-CM | POA: Diagnosis not present

## 2016-10-08 DIAGNOSIS — N2581 Secondary hyperparathyroidism of renal origin: Secondary | ICD-10-CM | POA: Diagnosis not present

## 2016-10-08 DIAGNOSIS — D509 Iron deficiency anemia, unspecified: Secondary | ICD-10-CM | POA: Diagnosis not present

## 2016-10-08 DIAGNOSIS — N186 End stage renal disease: Secondary | ICD-10-CM | POA: Diagnosis not present

## 2016-10-08 DIAGNOSIS — E119 Type 2 diabetes mellitus without complications: Secondary | ICD-10-CM | POA: Diagnosis not present

## 2016-10-10 DIAGNOSIS — N186 End stage renal disease: Secondary | ICD-10-CM | POA: Diagnosis not present

## 2016-10-10 DIAGNOSIS — D509 Iron deficiency anemia, unspecified: Secondary | ICD-10-CM | POA: Diagnosis not present

## 2016-10-10 DIAGNOSIS — E119 Type 2 diabetes mellitus without complications: Secondary | ICD-10-CM | POA: Diagnosis not present

## 2016-10-10 DIAGNOSIS — N2581 Secondary hyperparathyroidism of renal origin: Secondary | ICD-10-CM | POA: Diagnosis not present

## 2016-10-12 DIAGNOSIS — N186 End stage renal disease: Secondary | ICD-10-CM | POA: Diagnosis not present

## 2016-10-12 DIAGNOSIS — D509 Iron deficiency anemia, unspecified: Secondary | ICD-10-CM | POA: Diagnosis not present

## 2016-10-12 DIAGNOSIS — E119 Type 2 diabetes mellitus without complications: Secondary | ICD-10-CM | POA: Diagnosis not present

## 2016-10-12 DIAGNOSIS — N2581 Secondary hyperparathyroidism of renal origin: Secondary | ICD-10-CM | POA: Diagnosis not present

## 2016-10-15 DIAGNOSIS — N186 End stage renal disease: Secondary | ICD-10-CM | POA: Diagnosis not present

## 2016-10-15 DIAGNOSIS — D509 Iron deficiency anemia, unspecified: Secondary | ICD-10-CM | POA: Diagnosis not present

## 2016-10-15 DIAGNOSIS — N2581 Secondary hyperparathyroidism of renal origin: Secondary | ICD-10-CM | POA: Diagnosis not present

## 2016-10-15 DIAGNOSIS — E119 Type 2 diabetes mellitus without complications: Secondary | ICD-10-CM | POA: Diagnosis not present

## 2016-10-17 DIAGNOSIS — N186 End stage renal disease: Secondary | ICD-10-CM | POA: Diagnosis not present

## 2016-10-17 DIAGNOSIS — N2581 Secondary hyperparathyroidism of renal origin: Secondary | ICD-10-CM | POA: Diagnosis not present

## 2016-10-17 DIAGNOSIS — E119 Type 2 diabetes mellitus without complications: Secondary | ICD-10-CM | POA: Diagnosis not present

## 2016-10-17 DIAGNOSIS — D509 Iron deficiency anemia, unspecified: Secondary | ICD-10-CM | POA: Diagnosis not present

## 2016-10-19 DIAGNOSIS — D509 Iron deficiency anemia, unspecified: Secondary | ICD-10-CM | POA: Diagnosis not present

## 2016-10-19 DIAGNOSIS — N2581 Secondary hyperparathyroidism of renal origin: Secondary | ICD-10-CM | POA: Diagnosis not present

## 2016-10-19 DIAGNOSIS — E119 Type 2 diabetes mellitus without complications: Secondary | ICD-10-CM | POA: Diagnosis not present

## 2016-10-19 DIAGNOSIS — N186 End stage renal disease: Secondary | ICD-10-CM | POA: Diagnosis not present

## 2016-10-22 DIAGNOSIS — N186 End stage renal disease: Secondary | ICD-10-CM | POA: Diagnosis not present

## 2016-10-22 DIAGNOSIS — E119 Type 2 diabetes mellitus without complications: Secondary | ICD-10-CM | POA: Diagnosis not present

## 2016-10-22 DIAGNOSIS — D509 Iron deficiency anemia, unspecified: Secondary | ICD-10-CM | POA: Diagnosis not present

## 2016-10-22 DIAGNOSIS — N2581 Secondary hyperparathyroidism of renal origin: Secondary | ICD-10-CM | POA: Diagnosis not present

## 2016-10-24 DIAGNOSIS — N2581 Secondary hyperparathyroidism of renal origin: Secondary | ICD-10-CM | POA: Diagnosis not present

## 2016-10-24 DIAGNOSIS — N186 End stage renal disease: Secondary | ICD-10-CM | POA: Diagnosis not present

## 2016-10-24 DIAGNOSIS — E119 Type 2 diabetes mellitus without complications: Secondary | ICD-10-CM | POA: Diagnosis not present

## 2016-10-24 DIAGNOSIS — D509 Iron deficiency anemia, unspecified: Secondary | ICD-10-CM | POA: Diagnosis not present

## 2016-10-26 DIAGNOSIS — E119 Type 2 diabetes mellitus without complications: Secondary | ICD-10-CM | POA: Diagnosis not present

## 2016-10-26 DIAGNOSIS — N186 End stage renal disease: Secondary | ICD-10-CM | POA: Diagnosis not present

## 2016-10-26 DIAGNOSIS — N2581 Secondary hyperparathyroidism of renal origin: Secondary | ICD-10-CM | POA: Diagnosis not present

## 2016-10-26 DIAGNOSIS — D509 Iron deficiency anemia, unspecified: Secondary | ICD-10-CM | POA: Diagnosis not present

## 2016-10-27 DIAGNOSIS — N186 End stage renal disease: Secondary | ICD-10-CM | POA: Diagnosis not present

## 2016-10-27 DIAGNOSIS — N032 Chronic nephritic syndrome with diffuse membranous glomerulonephritis: Secondary | ICD-10-CM | POA: Diagnosis not present

## 2016-10-27 DIAGNOSIS — Z992 Dependence on renal dialysis: Secondary | ICD-10-CM | POA: Diagnosis not present

## 2016-10-29 DIAGNOSIS — E119 Type 2 diabetes mellitus without complications: Secondary | ICD-10-CM | POA: Diagnosis not present

## 2016-10-29 DIAGNOSIS — N186 End stage renal disease: Secondary | ICD-10-CM | POA: Diagnosis not present

## 2016-10-29 DIAGNOSIS — D509 Iron deficiency anemia, unspecified: Secondary | ICD-10-CM | POA: Diagnosis not present

## 2016-10-29 DIAGNOSIS — N2581 Secondary hyperparathyroidism of renal origin: Secondary | ICD-10-CM | POA: Diagnosis not present

## 2016-10-31 DIAGNOSIS — D509 Iron deficiency anemia, unspecified: Secondary | ICD-10-CM | POA: Diagnosis not present

## 2016-10-31 DIAGNOSIS — E119 Type 2 diabetes mellitus without complications: Secondary | ICD-10-CM | POA: Diagnosis not present

## 2016-10-31 DIAGNOSIS — N186 End stage renal disease: Secondary | ICD-10-CM | POA: Diagnosis not present

## 2016-10-31 DIAGNOSIS — N2581 Secondary hyperparathyroidism of renal origin: Secondary | ICD-10-CM | POA: Diagnosis not present

## 2016-11-02 DIAGNOSIS — E119 Type 2 diabetes mellitus without complications: Secondary | ICD-10-CM | POA: Diagnosis not present

## 2016-11-02 DIAGNOSIS — N186 End stage renal disease: Secondary | ICD-10-CM | POA: Diagnosis not present

## 2016-11-02 DIAGNOSIS — N2581 Secondary hyperparathyroidism of renal origin: Secondary | ICD-10-CM | POA: Diagnosis not present

## 2016-11-02 DIAGNOSIS — D509 Iron deficiency anemia, unspecified: Secondary | ICD-10-CM | POA: Diagnosis not present

## 2016-11-05 DIAGNOSIS — D509 Iron deficiency anemia, unspecified: Secondary | ICD-10-CM | POA: Diagnosis not present

## 2016-11-05 DIAGNOSIS — N186 End stage renal disease: Secondary | ICD-10-CM | POA: Diagnosis not present

## 2016-11-05 DIAGNOSIS — E119 Type 2 diabetes mellitus without complications: Secondary | ICD-10-CM | POA: Diagnosis not present

## 2016-11-05 DIAGNOSIS — N2581 Secondary hyperparathyroidism of renal origin: Secondary | ICD-10-CM | POA: Diagnosis not present

## 2016-11-07 DIAGNOSIS — E119 Type 2 diabetes mellitus without complications: Secondary | ICD-10-CM | POA: Diagnosis not present

## 2016-11-07 DIAGNOSIS — N186 End stage renal disease: Secondary | ICD-10-CM | POA: Diagnosis not present

## 2016-11-07 DIAGNOSIS — D509 Iron deficiency anemia, unspecified: Secondary | ICD-10-CM | POA: Diagnosis not present

## 2016-11-07 DIAGNOSIS — E1129 Type 2 diabetes mellitus with other diabetic kidney complication: Secondary | ICD-10-CM | POA: Diagnosis not present

## 2016-11-07 DIAGNOSIS — N2581 Secondary hyperparathyroidism of renal origin: Secondary | ICD-10-CM | POA: Diagnosis not present

## 2016-11-09 DIAGNOSIS — N186 End stage renal disease: Secondary | ICD-10-CM | POA: Diagnosis not present

## 2016-11-09 DIAGNOSIS — E119 Type 2 diabetes mellitus without complications: Secondary | ICD-10-CM | POA: Diagnosis not present

## 2016-11-09 DIAGNOSIS — N2581 Secondary hyperparathyroidism of renal origin: Secondary | ICD-10-CM | POA: Diagnosis not present

## 2016-11-09 DIAGNOSIS — D509 Iron deficiency anemia, unspecified: Secondary | ICD-10-CM | POA: Diagnosis not present

## 2016-11-12 DIAGNOSIS — N2581 Secondary hyperparathyroidism of renal origin: Secondary | ICD-10-CM | POA: Diagnosis not present

## 2016-11-12 DIAGNOSIS — N186 End stage renal disease: Secondary | ICD-10-CM | POA: Diagnosis not present

## 2016-11-12 DIAGNOSIS — D509 Iron deficiency anemia, unspecified: Secondary | ICD-10-CM | POA: Diagnosis not present

## 2016-11-12 DIAGNOSIS — E119 Type 2 diabetes mellitus without complications: Secondary | ICD-10-CM | POA: Diagnosis not present

## 2016-11-14 DIAGNOSIS — D509 Iron deficiency anemia, unspecified: Secondary | ICD-10-CM | POA: Diagnosis not present

## 2016-11-14 DIAGNOSIS — N2581 Secondary hyperparathyroidism of renal origin: Secondary | ICD-10-CM | POA: Diagnosis not present

## 2016-11-14 DIAGNOSIS — E119 Type 2 diabetes mellitus without complications: Secondary | ICD-10-CM | POA: Diagnosis not present

## 2016-11-14 DIAGNOSIS — N186 End stage renal disease: Secondary | ICD-10-CM | POA: Diagnosis not present

## 2016-11-16 DIAGNOSIS — N2581 Secondary hyperparathyroidism of renal origin: Secondary | ICD-10-CM | POA: Diagnosis not present

## 2016-11-16 DIAGNOSIS — N186 End stage renal disease: Secondary | ICD-10-CM | POA: Diagnosis not present

## 2016-11-16 DIAGNOSIS — D509 Iron deficiency anemia, unspecified: Secondary | ICD-10-CM | POA: Diagnosis not present

## 2016-11-16 DIAGNOSIS — E119 Type 2 diabetes mellitus without complications: Secondary | ICD-10-CM | POA: Diagnosis not present

## 2016-11-19 DIAGNOSIS — N186 End stage renal disease: Secondary | ICD-10-CM | POA: Diagnosis not present

## 2016-11-19 DIAGNOSIS — E119 Type 2 diabetes mellitus without complications: Secondary | ICD-10-CM | POA: Diagnosis not present

## 2016-11-19 DIAGNOSIS — D509 Iron deficiency anemia, unspecified: Secondary | ICD-10-CM | POA: Diagnosis not present

## 2016-11-19 DIAGNOSIS — N2581 Secondary hyperparathyroidism of renal origin: Secondary | ICD-10-CM | POA: Diagnosis not present

## 2016-11-21 DIAGNOSIS — D509 Iron deficiency anemia, unspecified: Secondary | ICD-10-CM | POA: Diagnosis not present

## 2016-11-21 DIAGNOSIS — N186 End stage renal disease: Secondary | ICD-10-CM | POA: Diagnosis not present

## 2016-11-21 DIAGNOSIS — N2581 Secondary hyperparathyroidism of renal origin: Secondary | ICD-10-CM | POA: Diagnosis not present

## 2016-11-21 DIAGNOSIS — E119 Type 2 diabetes mellitus without complications: Secondary | ICD-10-CM | POA: Diagnosis not present

## 2016-11-23 DIAGNOSIS — E119 Type 2 diabetes mellitus without complications: Secondary | ICD-10-CM | POA: Diagnosis not present

## 2016-11-23 DIAGNOSIS — N186 End stage renal disease: Secondary | ICD-10-CM | POA: Diagnosis not present

## 2016-11-23 DIAGNOSIS — D509 Iron deficiency anemia, unspecified: Secondary | ICD-10-CM | POA: Diagnosis not present

## 2016-11-23 DIAGNOSIS — N2581 Secondary hyperparathyroidism of renal origin: Secondary | ICD-10-CM | POA: Diagnosis not present

## 2016-11-26 DIAGNOSIS — N186 End stage renal disease: Secondary | ICD-10-CM | POA: Diagnosis not present

## 2016-11-26 DIAGNOSIS — E119 Type 2 diabetes mellitus without complications: Secondary | ICD-10-CM | POA: Diagnosis not present

## 2016-11-26 DIAGNOSIS — N2581 Secondary hyperparathyroidism of renal origin: Secondary | ICD-10-CM | POA: Diagnosis not present

## 2016-11-26 DIAGNOSIS — D509 Iron deficiency anemia, unspecified: Secondary | ICD-10-CM | POA: Diagnosis not present

## 2016-11-27 DIAGNOSIS — N186 End stage renal disease: Secondary | ICD-10-CM | POA: Diagnosis not present

## 2016-11-27 DIAGNOSIS — Z992 Dependence on renal dialysis: Secondary | ICD-10-CM | POA: Diagnosis not present

## 2016-11-27 DIAGNOSIS — N032 Chronic nephritic syndrome with diffuse membranous glomerulonephritis: Secondary | ICD-10-CM | POA: Diagnosis not present

## 2016-11-28 DIAGNOSIS — Z23 Encounter for immunization: Secondary | ICD-10-CM | POA: Diagnosis not present

## 2016-11-28 DIAGNOSIS — D509 Iron deficiency anemia, unspecified: Secondary | ICD-10-CM | POA: Diagnosis not present

## 2016-11-28 DIAGNOSIS — N186 End stage renal disease: Secondary | ICD-10-CM | POA: Diagnosis not present

## 2016-11-28 DIAGNOSIS — N2581 Secondary hyperparathyroidism of renal origin: Secondary | ICD-10-CM | POA: Diagnosis not present

## 2016-11-28 DIAGNOSIS — E119 Type 2 diabetes mellitus without complications: Secondary | ICD-10-CM | POA: Diagnosis not present

## 2016-11-30 DIAGNOSIS — N186 End stage renal disease: Secondary | ICD-10-CM | POA: Diagnosis not present

## 2016-11-30 DIAGNOSIS — N2581 Secondary hyperparathyroidism of renal origin: Secondary | ICD-10-CM | POA: Diagnosis not present

## 2016-11-30 DIAGNOSIS — Z23 Encounter for immunization: Secondary | ICD-10-CM | POA: Diagnosis not present

## 2016-11-30 DIAGNOSIS — D509 Iron deficiency anemia, unspecified: Secondary | ICD-10-CM | POA: Diagnosis not present

## 2016-11-30 DIAGNOSIS — E119 Type 2 diabetes mellitus without complications: Secondary | ICD-10-CM | POA: Diagnosis not present

## 2016-12-03 DIAGNOSIS — D509 Iron deficiency anemia, unspecified: Secondary | ICD-10-CM | POA: Diagnosis not present

## 2016-12-03 DIAGNOSIS — Z23 Encounter for immunization: Secondary | ICD-10-CM | POA: Diagnosis not present

## 2016-12-03 DIAGNOSIS — N186 End stage renal disease: Secondary | ICD-10-CM | POA: Diagnosis not present

## 2016-12-03 DIAGNOSIS — N2581 Secondary hyperparathyroidism of renal origin: Secondary | ICD-10-CM | POA: Diagnosis not present

## 2016-12-03 DIAGNOSIS — E119 Type 2 diabetes mellitus without complications: Secondary | ICD-10-CM | POA: Diagnosis not present

## 2016-12-05 DIAGNOSIS — N186 End stage renal disease: Secondary | ICD-10-CM | POA: Diagnosis not present

## 2016-12-05 DIAGNOSIS — D509 Iron deficiency anemia, unspecified: Secondary | ICD-10-CM | POA: Diagnosis not present

## 2016-12-05 DIAGNOSIS — N2581 Secondary hyperparathyroidism of renal origin: Secondary | ICD-10-CM | POA: Diagnosis not present

## 2016-12-05 DIAGNOSIS — Z23 Encounter for immunization: Secondary | ICD-10-CM | POA: Diagnosis not present

## 2016-12-05 DIAGNOSIS — E119 Type 2 diabetes mellitus without complications: Secondary | ICD-10-CM | POA: Diagnosis not present

## 2016-12-07 DIAGNOSIS — Z23 Encounter for immunization: Secondary | ICD-10-CM | POA: Diagnosis not present

## 2016-12-07 DIAGNOSIS — N2581 Secondary hyperparathyroidism of renal origin: Secondary | ICD-10-CM | POA: Diagnosis not present

## 2016-12-07 DIAGNOSIS — N186 End stage renal disease: Secondary | ICD-10-CM | POA: Diagnosis not present

## 2016-12-07 DIAGNOSIS — D509 Iron deficiency anemia, unspecified: Secondary | ICD-10-CM | POA: Diagnosis not present

## 2016-12-07 DIAGNOSIS — E119 Type 2 diabetes mellitus without complications: Secondary | ICD-10-CM | POA: Diagnosis not present

## 2016-12-10 DIAGNOSIS — E119 Type 2 diabetes mellitus without complications: Secondary | ICD-10-CM | POA: Diagnosis not present

## 2016-12-10 DIAGNOSIS — D509 Iron deficiency anemia, unspecified: Secondary | ICD-10-CM | POA: Diagnosis not present

## 2016-12-10 DIAGNOSIS — N186 End stage renal disease: Secondary | ICD-10-CM | POA: Diagnosis not present

## 2016-12-10 DIAGNOSIS — N2581 Secondary hyperparathyroidism of renal origin: Secondary | ICD-10-CM | POA: Diagnosis not present

## 2016-12-10 DIAGNOSIS — Z23 Encounter for immunization: Secondary | ICD-10-CM | POA: Diagnosis not present

## 2016-12-12 DIAGNOSIS — N2581 Secondary hyperparathyroidism of renal origin: Secondary | ICD-10-CM | POA: Diagnosis not present

## 2016-12-12 DIAGNOSIS — D509 Iron deficiency anemia, unspecified: Secondary | ICD-10-CM | POA: Diagnosis not present

## 2016-12-12 DIAGNOSIS — N186 End stage renal disease: Secondary | ICD-10-CM | POA: Diagnosis not present

## 2016-12-12 DIAGNOSIS — Z23 Encounter for immunization: Secondary | ICD-10-CM | POA: Diagnosis not present

## 2016-12-12 DIAGNOSIS — E119 Type 2 diabetes mellitus without complications: Secondary | ICD-10-CM | POA: Diagnosis not present

## 2016-12-14 DIAGNOSIS — N2581 Secondary hyperparathyroidism of renal origin: Secondary | ICD-10-CM | POA: Diagnosis not present

## 2016-12-14 DIAGNOSIS — N186 End stage renal disease: Secondary | ICD-10-CM | POA: Diagnosis not present

## 2016-12-14 DIAGNOSIS — E119 Type 2 diabetes mellitus without complications: Secondary | ICD-10-CM | POA: Diagnosis not present

## 2016-12-14 DIAGNOSIS — Z23 Encounter for immunization: Secondary | ICD-10-CM | POA: Diagnosis not present

## 2016-12-14 DIAGNOSIS — D509 Iron deficiency anemia, unspecified: Secondary | ICD-10-CM | POA: Diagnosis not present

## 2016-12-17 DIAGNOSIS — E119 Type 2 diabetes mellitus without complications: Secondary | ICD-10-CM | POA: Diagnosis not present

## 2016-12-17 DIAGNOSIS — D509 Iron deficiency anemia, unspecified: Secondary | ICD-10-CM | POA: Diagnosis not present

## 2016-12-17 DIAGNOSIS — Z23 Encounter for immunization: Secondary | ICD-10-CM | POA: Diagnosis not present

## 2016-12-17 DIAGNOSIS — N2581 Secondary hyperparathyroidism of renal origin: Secondary | ICD-10-CM | POA: Diagnosis not present

## 2016-12-17 DIAGNOSIS — N186 End stage renal disease: Secondary | ICD-10-CM | POA: Diagnosis not present

## 2016-12-19 DIAGNOSIS — N2581 Secondary hyperparathyroidism of renal origin: Secondary | ICD-10-CM | POA: Diagnosis not present

## 2016-12-19 DIAGNOSIS — E119 Type 2 diabetes mellitus without complications: Secondary | ICD-10-CM | POA: Diagnosis not present

## 2016-12-19 DIAGNOSIS — D509 Iron deficiency anemia, unspecified: Secondary | ICD-10-CM | POA: Diagnosis not present

## 2016-12-19 DIAGNOSIS — N186 End stage renal disease: Secondary | ICD-10-CM | POA: Diagnosis not present

## 2016-12-19 DIAGNOSIS — Z23 Encounter for immunization: Secondary | ICD-10-CM | POA: Diagnosis not present

## 2016-12-21 DIAGNOSIS — E119 Type 2 diabetes mellitus without complications: Secondary | ICD-10-CM | POA: Diagnosis not present

## 2016-12-21 DIAGNOSIS — N2581 Secondary hyperparathyroidism of renal origin: Secondary | ICD-10-CM | POA: Diagnosis not present

## 2016-12-21 DIAGNOSIS — Z23 Encounter for immunization: Secondary | ICD-10-CM | POA: Diagnosis not present

## 2016-12-21 DIAGNOSIS — N186 End stage renal disease: Secondary | ICD-10-CM | POA: Diagnosis not present

## 2016-12-21 DIAGNOSIS — D509 Iron deficiency anemia, unspecified: Secondary | ICD-10-CM | POA: Diagnosis not present

## 2016-12-24 DIAGNOSIS — Z23 Encounter for immunization: Secondary | ICD-10-CM | POA: Diagnosis not present

## 2016-12-24 DIAGNOSIS — D509 Iron deficiency anemia, unspecified: Secondary | ICD-10-CM | POA: Diagnosis not present

## 2016-12-24 DIAGNOSIS — N2581 Secondary hyperparathyroidism of renal origin: Secondary | ICD-10-CM | POA: Diagnosis not present

## 2016-12-24 DIAGNOSIS — E119 Type 2 diabetes mellitus without complications: Secondary | ICD-10-CM | POA: Diagnosis not present

## 2016-12-24 DIAGNOSIS — N186 End stage renal disease: Secondary | ICD-10-CM | POA: Diagnosis not present

## 2016-12-25 DIAGNOSIS — Z125 Encounter for screening for malignant neoplasm of prostate: Secondary | ICD-10-CM | POA: Diagnosis not present

## 2016-12-25 DIAGNOSIS — N032 Chronic nephritic syndrome with diffuse membranous glomerulonephritis: Secondary | ICD-10-CM | POA: Diagnosis not present

## 2016-12-25 DIAGNOSIS — Z992 Dependence on renal dialysis: Secondary | ICD-10-CM | POA: Diagnosis not present

## 2016-12-25 DIAGNOSIS — N186 End stage renal disease: Secondary | ICD-10-CM | POA: Diagnosis not present

## 2016-12-25 DIAGNOSIS — E785 Hyperlipidemia, unspecified: Secondary | ICD-10-CM | POA: Diagnosis not present

## 2016-12-25 DIAGNOSIS — E1122 Type 2 diabetes mellitus with diabetic chronic kidney disease: Secondary | ICD-10-CM | POA: Diagnosis not present

## 2016-12-25 DIAGNOSIS — F209 Schizophrenia, unspecified: Secondary | ICD-10-CM | POA: Diagnosis not present

## 2016-12-25 DIAGNOSIS — I10 Essential (primary) hypertension: Secondary | ICD-10-CM | POA: Diagnosis not present

## 2016-12-25 DIAGNOSIS — E1140 Type 2 diabetes mellitus with diabetic neuropathy, unspecified: Secondary | ICD-10-CM | POA: Diagnosis not present

## 2016-12-25 DIAGNOSIS — K219 Gastro-esophageal reflux disease without esophagitis: Secondary | ICD-10-CM | POA: Diagnosis not present

## 2016-12-26 DIAGNOSIS — N2581 Secondary hyperparathyroidism of renal origin: Secondary | ICD-10-CM | POA: Diagnosis not present

## 2016-12-26 DIAGNOSIS — N186 End stage renal disease: Secondary | ICD-10-CM | POA: Diagnosis not present

## 2016-12-26 DIAGNOSIS — D509 Iron deficiency anemia, unspecified: Secondary | ICD-10-CM | POA: Diagnosis not present

## 2016-12-26 DIAGNOSIS — E119 Type 2 diabetes mellitus without complications: Secondary | ICD-10-CM | POA: Diagnosis not present

## 2016-12-28 DIAGNOSIS — N186 End stage renal disease: Secondary | ICD-10-CM | POA: Diagnosis not present

## 2016-12-28 DIAGNOSIS — N2581 Secondary hyperparathyroidism of renal origin: Secondary | ICD-10-CM | POA: Diagnosis not present

## 2016-12-28 DIAGNOSIS — D509 Iron deficiency anemia, unspecified: Secondary | ICD-10-CM | POA: Diagnosis not present

## 2016-12-28 DIAGNOSIS — E119 Type 2 diabetes mellitus without complications: Secondary | ICD-10-CM | POA: Diagnosis not present

## 2016-12-31 DIAGNOSIS — E119 Type 2 diabetes mellitus without complications: Secondary | ICD-10-CM | POA: Diagnosis not present

## 2016-12-31 DIAGNOSIS — N186 End stage renal disease: Secondary | ICD-10-CM | POA: Diagnosis not present

## 2016-12-31 DIAGNOSIS — D509 Iron deficiency anemia, unspecified: Secondary | ICD-10-CM | POA: Diagnosis not present

## 2016-12-31 DIAGNOSIS — N2581 Secondary hyperparathyroidism of renal origin: Secondary | ICD-10-CM | POA: Diagnosis not present

## 2017-01-02 DIAGNOSIS — D509 Iron deficiency anemia, unspecified: Secondary | ICD-10-CM | POA: Diagnosis not present

## 2017-01-02 DIAGNOSIS — N2581 Secondary hyperparathyroidism of renal origin: Secondary | ICD-10-CM | POA: Diagnosis not present

## 2017-01-02 DIAGNOSIS — E119 Type 2 diabetes mellitus without complications: Secondary | ICD-10-CM | POA: Diagnosis not present

## 2017-01-02 DIAGNOSIS — N186 End stage renal disease: Secondary | ICD-10-CM | POA: Diagnosis not present

## 2017-01-04 DIAGNOSIS — N186 End stage renal disease: Secondary | ICD-10-CM | POA: Diagnosis not present

## 2017-01-04 DIAGNOSIS — E119 Type 2 diabetes mellitus without complications: Secondary | ICD-10-CM | POA: Diagnosis not present

## 2017-01-04 DIAGNOSIS — N2581 Secondary hyperparathyroidism of renal origin: Secondary | ICD-10-CM | POA: Diagnosis not present

## 2017-01-04 DIAGNOSIS — D509 Iron deficiency anemia, unspecified: Secondary | ICD-10-CM | POA: Diagnosis not present

## 2017-01-07 DIAGNOSIS — N2581 Secondary hyperparathyroidism of renal origin: Secondary | ICD-10-CM | POA: Diagnosis not present

## 2017-01-07 DIAGNOSIS — D509 Iron deficiency anemia, unspecified: Secondary | ICD-10-CM | POA: Diagnosis not present

## 2017-01-07 DIAGNOSIS — N186 End stage renal disease: Secondary | ICD-10-CM | POA: Diagnosis not present

## 2017-01-07 DIAGNOSIS — E119 Type 2 diabetes mellitus without complications: Secondary | ICD-10-CM | POA: Diagnosis not present

## 2017-01-09 DIAGNOSIS — N186 End stage renal disease: Secondary | ICD-10-CM | POA: Diagnosis not present

## 2017-01-09 DIAGNOSIS — E119 Type 2 diabetes mellitus without complications: Secondary | ICD-10-CM | POA: Diagnosis not present

## 2017-01-09 DIAGNOSIS — N2581 Secondary hyperparathyroidism of renal origin: Secondary | ICD-10-CM | POA: Diagnosis not present

## 2017-01-09 DIAGNOSIS — D509 Iron deficiency anemia, unspecified: Secondary | ICD-10-CM | POA: Diagnosis not present

## 2017-01-11 DIAGNOSIS — N2581 Secondary hyperparathyroidism of renal origin: Secondary | ICD-10-CM | POA: Diagnosis not present

## 2017-01-11 DIAGNOSIS — N186 End stage renal disease: Secondary | ICD-10-CM | POA: Diagnosis not present

## 2017-01-11 DIAGNOSIS — D509 Iron deficiency anemia, unspecified: Secondary | ICD-10-CM | POA: Diagnosis not present

## 2017-01-11 DIAGNOSIS — E119 Type 2 diabetes mellitus without complications: Secondary | ICD-10-CM | POA: Diagnosis not present

## 2017-01-14 DIAGNOSIS — D509 Iron deficiency anemia, unspecified: Secondary | ICD-10-CM | POA: Diagnosis not present

## 2017-01-14 DIAGNOSIS — E119 Type 2 diabetes mellitus without complications: Secondary | ICD-10-CM | POA: Diagnosis not present

## 2017-01-14 DIAGNOSIS — N2581 Secondary hyperparathyroidism of renal origin: Secondary | ICD-10-CM | POA: Diagnosis not present

## 2017-01-14 DIAGNOSIS — N186 End stage renal disease: Secondary | ICD-10-CM | POA: Diagnosis not present

## 2017-01-16 DIAGNOSIS — E119 Type 2 diabetes mellitus without complications: Secondary | ICD-10-CM | POA: Diagnosis not present

## 2017-01-16 DIAGNOSIS — D509 Iron deficiency anemia, unspecified: Secondary | ICD-10-CM | POA: Diagnosis not present

## 2017-01-16 DIAGNOSIS — N2581 Secondary hyperparathyroidism of renal origin: Secondary | ICD-10-CM | POA: Diagnosis not present

## 2017-01-16 DIAGNOSIS — N186 End stage renal disease: Secondary | ICD-10-CM | POA: Diagnosis not present

## 2017-01-18 DIAGNOSIS — N2581 Secondary hyperparathyroidism of renal origin: Secondary | ICD-10-CM | POA: Diagnosis not present

## 2017-01-18 DIAGNOSIS — E119 Type 2 diabetes mellitus without complications: Secondary | ICD-10-CM | POA: Diagnosis not present

## 2017-01-18 DIAGNOSIS — N186 End stage renal disease: Secondary | ICD-10-CM | POA: Diagnosis not present

## 2017-01-18 DIAGNOSIS — D509 Iron deficiency anemia, unspecified: Secondary | ICD-10-CM | POA: Diagnosis not present

## 2017-01-21 DIAGNOSIS — N2581 Secondary hyperparathyroidism of renal origin: Secondary | ICD-10-CM | POA: Diagnosis not present

## 2017-01-21 DIAGNOSIS — N186 End stage renal disease: Secondary | ICD-10-CM | POA: Diagnosis not present

## 2017-01-21 DIAGNOSIS — E119 Type 2 diabetes mellitus without complications: Secondary | ICD-10-CM | POA: Diagnosis not present

## 2017-01-21 DIAGNOSIS — D509 Iron deficiency anemia, unspecified: Secondary | ICD-10-CM | POA: Diagnosis not present

## 2017-01-23 DIAGNOSIS — N2581 Secondary hyperparathyroidism of renal origin: Secondary | ICD-10-CM | POA: Diagnosis not present

## 2017-01-23 DIAGNOSIS — E119 Type 2 diabetes mellitus without complications: Secondary | ICD-10-CM | POA: Diagnosis not present

## 2017-01-23 DIAGNOSIS — D509 Iron deficiency anemia, unspecified: Secondary | ICD-10-CM | POA: Diagnosis not present

## 2017-01-23 DIAGNOSIS — N186 End stage renal disease: Secondary | ICD-10-CM | POA: Diagnosis not present

## 2017-01-25 DIAGNOSIS — N2581 Secondary hyperparathyroidism of renal origin: Secondary | ICD-10-CM | POA: Diagnosis not present

## 2017-01-25 DIAGNOSIS — Z992 Dependence on renal dialysis: Secondary | ICD-10-CM | POA: Diagnosis not present

## 2017-01-25 DIAGNOSIS — E119 Type 2 diabetes mellitus without complications: Secondary | ICD-10-CM | POA: Diagnosis not present

## 2017-01-25 DIAGNOSIS — N186 End stage renal disease: Secondary | ICD-10-CM | POA: Diagnosis not present

## 2017-01-25 DIAGNOSIS — N032 Chronic nephritic syndrome with diffuse membranous glomerulonephritis: Secondary | ICD-10-CM | POA: Diagnosis not present

## 2017-01-25 DIAGNOSIS — D509 Iron deficiency anemia, unspecified: Secondary | ICD-10-CM | POA: Diagnosis not present

## 2017-01-28 DIAGNOSIS — N2581 Secondary hyperparathyroidism of renal origin: Secondary | ICD-10-CM | POA: Diagnosis not present

## 2017-01-28 DIAGNOSIS — N186 End stage renal disease: Secondary | ICD-10-CM | POA: Diagnosis not present

## 2017-01-28 DIAGNOSIS — D509 Iron deficiency anemia, unspecified: Secondary | ICD-10-CM | POA: Diagnosis not present

## 2017-01-28 DIAGNOSIS — E119 Type 2 diabetes mellitus without complications: Secondary | ICD-10-CM | POA: Diagnosis not present

## 2017-01-30 DIAGNOSIS — D509 Iron deficiency anemia, unspecified: Secondary | ICD-10-CM | POA: Diagnosis not present

## 2017-01-30 DIAGNOSIS — N2581 Secondary hyperparathyroidism of renal origin: Secondary | ICD-10-CM | POA: Diagnosis not present

## 2017-01-30 DIAGNOSIS — E119 Type 2 diabetes mellitus without complications: Secondary | ICD-10-CM | POA: Diagnosis not present

## 2017-01-30 DIAGNOSIS — N186 End stage renal disease: Secondary | ICD-10-CM | POA: Diagnosis not present

## 2017-02-01 DIAGNOSIS — N186 End stage renal disease: Secondary | ICD-10-CM | POA: Diagnosis not present

## 2017-02-01 DIAGNOSIS — D509 Iron deficiency anemia, unspecified: Secondary | ICD-10-CM | POA: Diagnosis not present

## 2017-02-01 DIAGNOSIS — E119 Type 2 diabetes mellitus without complications: Secondary | ICD-10-CM | POA: Diagnosis not present

## 2017-02-01 DIAGNOSIS — N2581 Secondary hyperparathyroidism of renal origin: Secondary | ICD-10-CM | POA: Diagnosis not present

## 2017-02-04 DIAGNOSIS — E119 Type 2 diabetes mellitus without complications: Secondary | ICD-10-CM | POA: Diagnosis not present

## 2017-02-04 DIAGNOSIS — D509 Iron deficiency anemia, unspecified: Secondary | ICD-10-CM | POA: Diagnosis not present

## 2017-02-04 DIAGNOSIS — N2581 Secondary hyperparathyroidism of renal origin: Secondary | ICD-10-CM | POA: Diagnosis not present

## 2017-02-04 DIAGNOSIS — N186 End stage renal disease: Secondary | ICD-10-CM | POA: Diagnosis not present

## 2017-02-06 DIAGNOSIS — N186 End stage renal disease: Secondary | ICD-10-CM | POA: Diagnosis not present

## 2017-02-06 DIAGNOSIS — E119 Type 2 diabetes mellitus without complications: Secondary | ICD-10-CM | POA: Diagnosis not present

## 2017-02-06 DIAGNOSIS — D509 Iron deficiency anemia, unspecified: Secondary | ICD-10-CM | POA: Diagnosis not present

## 2017-02-06 DIAGNOSIS — E1129 Type 2 diabetes mellitus with other diabetic kidney complication: Secondary | ICD-10-CM | POA: Diagnosis not present

## 2017-02-06 DIAGNOSIS — N2581 Secondary hyperparathyroidism of renal origin: Secondary | ICD-10-CM | POA: Diagnosis not present

## 2017-02-07 ENCOUNTER — Encounter: Payer: Self-pay | Admitting: Cardiology

## 2017-02-08 DIAGNOSIS — E119 Type 2 diabetes mellitus without complications: Secondary | ICD-10-CM | POA: Diagnosis not present

## 2017-02-08 DIAGNOSIS — N186 End stage renal disease: Secondary | ICD-10-CM | POA: Diagnosis not present

## 2017-02-08 DIAGNOSIS — D509 Iron deficiency anemia, unspecified: Secondary | ICD-10-CM | POA: Diagnosis not present

## 2017-02-08 DIAGNOSIS — N2581 Secondary hyperparathyroidism of renal origin: Secondary | ICD-10-CM | POA: Diagnosis not present

## 2017-02-11 DIAGNOSIS — E119 Type 2 diabetes mellitus without complications: Secondary | ICD-10-CM | POA: Diagnosis not present

## 2017-02-11 DIAGNOSIS — N2581 Secondary hyperparathyroidism of renal origin: Secondary | ICD-10-CM | POA: Diagnosis not present

## 2017-02-11 DIAGNOSIS — N186 End stage renal disease: Secondary | ICD-10-CM | POA: Diagnosis not present

## 2017-02-11 DIAGNOSIS — D509 Iron deficiency anemia, unspecified: Secondary | ICD-10-CM | POA: Diagnosis not present

## 2017-02-13 DIAGNOSIS — E119 Type 2 diabetes mellitus without complications: Secondary | ICD-10-CM | POA: Diagnosis not present

## 2017-02-13 DIAGNOSIS — N2581 Secondary hyperparathyroidism of renal origin: Secondary | ICD-10-CM | POA: Diagnosis not present

## 2017-02-13 DIAGNOSIS — D509 Iron deficiency anemia, unspecified: Secondary | ICD-10-CM | POA: Diagnosis not present

## 2017-02-13 DIAGNOSIS — N186 End stage renal disease: Secondary | ICD-10-CM | POA: Diagnosis not present

## 2017-02-15 DIAGNOSIS — N186 End stage renal disease: Secondary | ICD-10-CM | POA: Diagnosis not present

## 2017-02-15 DIAGNOSIS — E119 Type 2 diabetes mellitus without complications: Secondary | ICD-10-CM | POA: Diagnosis not present

## 2017-02-15 DIAGNOSIS — D509 Iron deficiency anemia, unspecified: Secondary | ICD-10-CM | POA: Diagnosis not present

## 2017-02-15 DIAGNOSIS — N2581 Secondary hyperparathyroidism of renal origin: Secondary | ICD-10-CM | POA: Diagnosis not present

## 2017-02-18 DIAGNOSIS — E119 Type 2 diabetes mellitus without complications: Secondary | ICD-10-CM | POA: Diagnosis not present

## 2017-02-18 DIAGNOSIS — N2581 Secondary hyperparathyroidism of renal origin: Secondary | ICD-10-CM | POA: Diagnosis not present

## 2017-02-18 DIAGNOSIS — N186 End stage renal disease: Secondary | ICD-10-CM | POA: Diagnosis not present

## 2017-02-18 DIAGNOSIS — D509 Iron deficiency anemia, unspecified: Secondary | ICD-10-CM | POA: Diagnosis not present

## 2017-02-20 DIAGNOSIS — N186 End stage renal disease: Secondary | ICD-10-CM | POA: Diagnosis not present

## 2017-02-20 DIAGNOSIS — D509 Iron deficiency anemia, unspecified: Secondary | ICD-10-CM | POA: Diagnosis not present

## 2017-02-20 DIAGNOSIS — N2581 Secondary hyperparathyroidism of renal origin: Secondary | ICD-10-CM | POA: Diagnosis not present

## 2017-02-20 DIAGNOSIS — E119 Type 2 diabetes mellitus without complications: Secondary | ICD-10-CM | POA: Diagnosis not present

## 2017-02-22 DIAGNOSIS — N186 End stage renal disease: Secondary | ICD-10-CM | POA: Diagnosis not present

## 2017-02-22 DIAGNOSIS — D509 Iron deficiency anemia, unspecified: Secondary | ICD-10-CM | POA: Diagnosis not present

## 2017-02-22 DIAGNOSIS — N2581 Secondary hyperparathyroidism of renal origin: Secondary | ICD-10-CM | POA: Diagnosis not present

## 2017-02-22 DIAGNOSIS — E119 Type 2 diabetes mellitus without complications: Secondary | ICD-10-CM | POA: Diagnosis not present

## 2017-02-24 DIAGNOSIS — N186 End stage renal disease: Secondary | ICD-10-CM | POA: Diagnosis not present

## 2017-02-24 DIAGNOSIS — N032 Chronic nephritic syndrome with diffuse membranous glomerulonephritis: Secondary | ICD-10-CM | POA: Diagnosis not present

## 2017-02-24 DIAGNOSIS — Z992 Dependence on renal dialysis: Secondary | ICD-10-CM | POA: Diagnosis not present

## 2017-02-25 DIAGNOSIS — D509 Iron deficiency anemia, unspecified: Secondary | ICD-10-CM | POA: Diagnosis not present

## 2017-02-25 DIAGNOSIS — N2581 Secondary hyperparathyroidism of renal origin: Secondary | ICD-10-CM | POA: Diagnosis not present

## 2017-02-25 DIAGNOSIS — N186 End stage renal disease: Secondary | ICD-10-CM | POA: Diagnosis not present

## 2017-02-25 DIAGNOSIS — E119 Type 2 diabetes mellitus without complications: Secondary | ICD-10-CM | POA: Diagnosis not present

## 2017-02-27 DIAGNOSIS — E119 Type 2 diabetes mellitus without complications: Secondary | ICD-10-CM | POA: Diagnosis not present

## 2017-02-27 DIAGNOSIS — N2581 Secondary hyperparathyroidism of renal origin: Secondary | ICD-10-CM | POA: Diagnosis not present

## 2017-02-27 DIAGNOSIS — D509 Iron deficiency anemia, unspecified: Secondary | ICD-10-CM | POA: Diagnosis not present

## 2017-02-27 DIAGNOSIS — N186 End stage renal disease: Secondary | ICD-10-CM | POA: Diagnosis not present

## 2017-03-01 DIAGNOSIS — D509 Iron deficiency anemia, unspecified: Secondary | ICD-10-CM | POA: Diagnosis not present

## 2017-03-01 DIAGNOSIS — N186 End stage renal disease: Secondary | ICD-10-CM | POA: Diagnosis not present

## 2017-03-01 DIAGNOSIS — N2581 Secondary hyperparathyroidism of renal origin: Secondary | ICD-10-CM | POA: Diagnosis not present

## 2017-03-01 DIAGNOSIS — E119 Type 2 diabetes mellitus without complications: Secondary | ICD-10-CM | POA: Diagnosis not present

## 2017-03-04 DIAGNOSIS — D509 Iron deficiency anemia, unspecified: Secondary | ICD-10-CM | POA: Diagnosis not present

## 2017-03-04 DIAGNOSIS — N2581 Secondary hyperparathyroidism of renal origin: Secondary | ICD-10-CM | POA: Diagnosis not present

## 2017-03-04 DIAGNOSIS — N186 End stage renal disease: Secondary | ICD-10-CM | POA: Diagnosis not present

## 2017-03-04 DIAGNOSIS — E119 Type 2 diabetes mellitus without complications: Secondary | ICD-10-CM | POA: Diagnosis not present

## 2017-03-06 DIAGNOSIS — N2581 Secondary hyperparathyroidism of renal origin: Secondary | ICD-10-CM | POA: Diagnosis not present

## 2017-03-06 DIAGNOSIS — D509 Iron deficiency anemia, unspecified: Secondary | ICD-10-CM | POA: Diagnosis not present

## 2017-03-06 DIAGNOSIS — N186 End stage renal disease: Secondary | ICD-10-CM | POA: Diagnosis not present

## 2017-03-06 DIAGNOSIS — E119 Type 2 diabetes mellitus without complications: Secondary | ICD-10-CM | POA: Diagnosis not present

## 2017-03-08 DIAGNOSIS — E119 Type 2 diabetes mellitus without complications: Secondary | ICD-10-CM | POA: Diagnosis not present

## 2017-03-08 DIAGNOSIS — N2581 Secondary hyperparathyroidism of renal origin: Secondary | ICD-10-CM | POA: Diagnosis not present

## 2017-03-08 DIAGNOSIS — D509 Iron deficiency anemia, unspecified: Secondary | ICD-10-CM | POA: Diagnosis not present

## 2017-03-08 DIAGNOSIS — N186 End stage renal disease: Secondary | ICD-10-CM | POA: Diagnosis not present

## 2017-03-11 DIAGNOSIS — D509 Iron deficiency anemia, unspecified: Secondary | ICD-10-CM | POA: Diagnosis not present

## 2017-03-11 DIAGNOSIS — N186 End stage renal disease: Secondary | ICD-10-CM | POA: Diagnosis not present

## 2017-03-11 DIAGNOSIS — E119 Type 2 diabetes mellitus without complications: Secondary | ICD-10-CM | POA: Diagnosis not present

## 2017-03-11 DIAGNOSIS — N2581 Secondary hyperparathyroidism of renal origin: Secondary | ICD-10-CM | POA: Diagnosis not present

## 2017-03-13 DIAGNOSIS — N2581 Secondary hyperparathyroidism of renal origin: Secondary | ICD-10-CM | POA: Diagnosis not present

## 2017-03-13 DIAGNOSIS — D509 Iron deficiency anemia, unspecified: Secondary | ICD-10-CM | POA: Diagnosis not present

## 2017-03-13 DIAGNOSIS — E119 Type 2 diabetes mellitus without complications: Secondary | ICD-10-CM | POA: Diagnosis not present

## 2017-03-13 DIAGNOSIS — N186 End stage renal disease: Secondary | ICD-10-CM | POA: Diagnosis not present

## 2017-03-15 DIAGNOSIS — D509 Iron deficiency anemia, unspecified: Secondary | ICD-10-CM | POA: Diagnosis not present

## 2017-03-15 DIAGNOSIS — E119 Type 2 diabetes mellitus without complications: Secondary | ICD-10-CM | POA: Diagnosis not present

## 2017-03-15 DIAGNOSIS — N186 End stage renal disease: Secondary | ICD-10-CM | POA: Diagnosis not present

## 2017-03-15 DIAGNOSIS — N2581 Secondary hyperparathyroidism of renal origin: Secondary | ICD-10-CM | POA: Diagnosis not present

## 2017-03-18 DIAGNOSIS — D509 Iron deficiency anemia, unspecified: Secondary | ICD-10-CM | POA: Diagnosis not present

## 2017-03-18 DIAGNOSIS — E119 Type 2 diabetes mellitus without complications: Secondary | ICD-10-CM | POA: Diagnosis not present

## 2017-03-18 DIAGNOSIS — N2581 Secondary hyperparathyroidism of renal origin: Secondary | ICD-10-CM | POA: Diagnosis not present

## 2017-03-18 DIAGNOSIS — N186 End stage renal disease: Secondary | ICD-10-CM | POA: Diagnosis not present

## 2017-03-20 DIAGNOSIS — N2581 Secondary hyperparathyroidism of renal origin: Secondary | ICD-10-CM | POA: Diagnosis not present

## 2017-03-20 DIAGNOSIS — N186 End stage renal disease: Secondary | ICD-10-CM | POA: Diagnosis not present

## 2017-03-20 DIAGNOSIS — E119 Type 2 diabetes mellitus without complications: Secondary | ICD-10-CM | POA: Diagnosis not present

## 2017-03-20 DIAGNOSIS — D509 Iron deficiency anemia, unspecified: Secondary | ICD-10-CM | POA: Diagnosis not present

## 2017-03-22 DIAGNOSIS — N186 End stage renal disease: Secondary | ICD-10-CM | POA: Diagnosis not present

## 2017-03-22 DIAGNOSIS — E119 Type 2 diabetes mellitus without complications: Secondary | ICD-10-CM | POA: Diagnosis not present

## 2017-03-22 DIAGNOSIS — N2581 Secondary hyperparathyroidism of renal origin: Secondary | ICD-10-CM | POA: Diagnosis not present

## 2017-03-22 DIAGNOSIS — D509 Iron deficiency anemia, unspecified: Secondary | ICD-10-CM | POA: Diagnosis not present

## 2017-03-25 DIAGNOSIS — N2581 Secondary hyperparathyroidism of renal origin: Secondary | ICD-10-CM | POA: Diagnosis not present

## 2017-03-25 DIAGNOSIS — D509 Iron deficiency anemia, unspecified: Secondary | ICD-10-CM | POA: Diagnosis not present

## 2017-03-25 DIAGNOSIS — E119 Type 2 diabetes mellitus without complications: Secondary | ICD-10-CM | POA: Diagnosis not present

## 2017-03-25 DIAGNOSIS — N186 End stage renal disease: Secondary | ICD-10-CM | POA: Diagnosis not present

## 2017-03-27 DIAGNOSIS — D509 Iron deficiency anemia, unspecified: Secondary | ICD-10-CM | POA: Diagnosis not present

## 2017-03-27 DIAGNOSIS — N2581 Secondary hyperparathyroidism of renal origin: Secondary | ICD-10-CM | POA: Diagnosis not present

## 2017-03-27 DIAGNOSIS — N032 Chronic nephritic syndrome with diffuse membranous glomerulonephritis: Secondary | ICD-10-CM | POA: Diagnosis not present

## 2017-03-27 DIAGNOSIS — E119 Type 2 diabetes mellitus without complications: Secondary | ICD-10-CM | POA: Diagnosis not present

## 2017-03-27 DIAGNOSIS — N186 End stage renal disease: Secondary | ICD-10-CM | POA: Diagnosis not present

## 2017-03-27 DIAGNOSIS — Z992 Dependence on renal dialysis: Secondary | ICD-10-CM | POA: Diagnosis not present

## 2017-03-29 DIAGNOSIS — D509 Iron deficiency anemia, unspecified: Secondary | ICD-10-CM | POA: Diagnosis not present

## 2017-03-29 DIAGNOSIS — N2581 Secondary hyperparathyroidism of renal origin: Secondary | ICD-10-CM | POA: Diagnosis not present

## 2017-03-29 DIAGNOSIS — E119 Type 2 diabetes mellitus without complications: Secondary | ICD-10-CM | POA: Diagnosis not present

## 2017-03-29 DIAGNOSIS — N186 End stage renal disease: Secondary | ICD-10-CM | POA: Diagnosis not present

## 2017-03-31 ENCOUNTER — Encounter: Payer: Medicare Other | Admitting: Internal Medicine

## 2017-04-01 ENCOUNTER — Encounter: Payer: Self-pay | Admitting: Internal Medicine

## 2017-04-01 DIAGNOSIS — N2581 Secondary hyperparathyroidism of renal origin: Secondary | ICD-10-CM | POA: Diagnosis not present

## 2017-04-01 DIAGNOSIS — E119 Type 2 diabetes mellitus without complications: Secondary | ICD-10-CM | POA: Diagnosis not present

## 2017-04-01 DIAGNOSIS — D509 Iron deficiency anemia, unspecified: Secondary | ICD-10-CM | POA: Diagnosis not present

## 2017-04-01 DIAGNOSIS — N186 End stage renal disease: Secondary | ICD-10-CM | POA: Diagnosis not present

## 2017-04-03 DIAGNOSIS — N186 End stage renal disease: Secondary | ICD-10-CM | POA: Diagnosis not present

## 2017-04-03 DIAGNOSIS — N2581 Secondary hyperparathyroidism of renal origin: Secondary | ICD-10-CM | POA: Diagnosis not present

## 2017-04-03 DIAGNOSIS — E119 Type 2 diabetes mellitus without complications: Secondary | ICD-10-CM | POA: Diagnosis not present

## 2017-04-03 DIAGNOSIS — D509 Iron deficiency anemia, unspecified: Secondary | ICD-10-CM | POA: Diagnosis not present

## 2017-04-05 DIAGNOSIS — N2581 Secondary hyperparathyroidism of renal origin: Secondary | ICD-10-CM | POA: Diagnosis not present

## 2017-04-05 DIAGNOSIS — E119 Type 2 diabetes mellitus without complications: Secondary | ICD-10-CM | POA: Diagnosis not present

## 2017-04-05 DIAGNOSIS — N186 End stage renal disease: Secondary | ICD-10-CM | POA: Diagnosis not present

## 2017-04-05 DIAGNOSIS — D509 Iron deficiency anemia, unspecified: Secondary | ICD-10-CM | POA: Diagnosis not present

## 2017-04-08 DIAGNOSIS — E119 Type 2 diabetes mellitus without complications: Secondary | ICD-10-CM | POA: Diagnosis not present

## 2017-04-08 DIAGNOSIS — D509 Iron deficiency anemia, unspecified: Secondary | ICD-10-CM | POA: Diagnosis not present

## 2017-04-08 DIAGNOSIS — N2581 Secondary hyperparathyroidism of renal origin: Secondary | ICD-10-CM | POA: Diagnosis not present

## 2017-04-08 DIAGNOSIS — N186 End stage renal disease: Secondary | ICD-10-CM | POA: Diagnosis not present

## 2017-04-09 DIAGNOSIS — F250 Schizoaffective disorder, bipolar type: Secondary | ICD-10-CM | POA: Diagnosis not present

## 2017-04-10 DIAGNOSIS — N2581 Secondary hyperparathyroidism of renal origin: Secondary | ICD-10-CM | POA: Diagnosis not present

## 2017-04-10 DIAGNOSIS — D509 Iron deficiency anemia, unspecified: Secondary | ICD-10-CM | POA: Diagnosis not present

## 2017-04-10 DIAGNOSIS — E119 Type 2 diabetes mellitus without complications: Secondary | ICD-10-CM | POA: Diagnosis not present

## 2017-04-10 DIAGNOSIS — N186 End stage renal disease: Secondary | ICD-10-CM | POA: Diagnosis not present

## 2017-04-11 DIAGNOSIS — Z992 Dependence on renal dialysis: Secondary | ICD-10-CM | POA: Diagnosis not present

## 2017-04-11 DIAGNOSIS — I871 Compression of vein: Secondary | ICD-10-CM | POA: Diagnosis not present

## 2017-04-11 DIAGNOSIS — N186 End stage renal disease: Secondary | ICD-10-CM | POA: Diagnosis not present

## 2017-04-11 DIAGNOSIS — I771 Stricture of artery: Secondary | ICD-10-CM | POA: Diagnosis not present

## 2017-04-12 DIAGNOSIS — N2581 Secondary hyperparathyroidism of renal origin: Secondary | ICD-10-CM | POA: Diagnosis not present

## 2017-04-12 DIAGNOSIS — D509 Iron deficiency anemia, unspecified: Secondary | ICD-10-CM | POA: Diagnosis not present

## 2017-04-12 DIAGNOSIS — N186 End stage renal disease: Secondary | ICD-10-CM | POA: Diagnosis not present

## 2017-04-12 DIAGNOSIS — E119 Type 2 diabetes mellitus without complications: Secondary | ICD-10-CM | POA: Diagnosis not present

## 2017-04-15 DIAGNOSIS — N186 End stage renal disease: Secondary | ICD-10-CM | POA: Diagnosis not present

## 2017-04-15 DIAGNOSIS — D509 Iron deficiency anemia, unspecified: Secondary | ICD-10-CM | POA: Diagnosis not present

## 2017-04-15 DIAGNOSIS — E119 Type 2 diabetes mellitus without complications: Secondary | ICD-10-CM | POA: Diagnosis not present

## 2017-04-15 DIAGNOSIS — N2581 Secondary hyperparathyroidism of renal origin: Secondary | ICD-10-CM | POA: Diagnosis not present

## 2017-04-17 DIAGNOSIS — N2581 Secondary hyperparathyroidism of renal origin: Secondary | ICD-10-CM | POA: Diagnosis not present

## 2017-04-17 DIAGNOSIS — N186 End stage renal disease: Secondary | ICD-10-CM | POA: Diagnosis not present

## 2017-04-17 DIAGNOSIS — E119 Type 2 diabetes mellitus without complications: Secondary | ICD-10-CM | POA: Diagnosis not present

## 2017-04-17 DIAGNOSIS — D509 Iron deficiency anemia, unspecified: Secondary | ICD-10-CM | POA: Diagnosis not present

## 2017-04-19 DIAGNOSIS — E119 Type 2 diabetes mellitus without complications: Secondary | ICD-10-CM | POA: Diagnosis not present

## 2017-04-19 DIAGNOSIS — D509 Iron deficiency anemia, unspecified: Secondary | ICD-10-CM | POA: Diagnosis not present

## 2017-04-19 DIAGNOSIS — N186 End stage renal disease: Secondary | ICD-10-CM | POA: Diagnosis not present

## 2017-04-19 DIAGNOSIS — N2581 Secondary hyperparathyroidism of renal origin: Secondary | ICD-10-CM | POA: Diagnosis not present

## 2017-04-22 DIAGNOSIS — N186 End stage renal disease: Secondary | ICD-10-CM | POA: Diagnosis not present

## 2017-04-22 DIAGNOSIS — D509 Iron deficiency anemia, unspecified: Secondary | ICD-10-CM | POA: Diagnosis not present

## 2017-04-22 DIAGNOSIS — N2581 Secondary hyperparathyroidism of renal origin: Secondary | ICD-10-CM | POA: Diagnosis not present

## 2017-04-22 DIAGNOSIS — E119 Type 2 diabetes mellitus without complications: Secondary | ICD-10-CM | POA: Diagnosis not present

## 2017-04-24 DIAGNOSIS — E119 Type 2 diabetes mellitus without complications: Secondary | ICD-10-CM | POA: Diagnosis not present

## 2017-04-24 DIAGNOSIS — N186 End stage renal disease: Secondary | ICD-10-CM | POA: Diagnosis not present

## 2017-04-24 DIAGNOSIS — N2581 Secondary hyperparathyroidism of renal origin: Secondary | ICD-10-CM | POA: Diagnosis not present

## 2017-04-24 DIAGNOSIS — D509 Iron deficiency anemia, unspecified: Secondary | ICD-10-CM | POA: Diagnosis not present

## 2017-04-25 ENCOUNTER — Encounter: Payer: Medicare Other | Admitting: Internal Medicine

## 2017-04-26 DIAGNOSIS — N186 End stage renal disease: Secondary | ICD-10-CM | POA: Diagnosis not present

## 2017-04-26 DIAGNOSIS — N032 Chronic nephritic syndrome with diffuse membranous glomerulonephritis: Secondary | ICD-10-CM | POA: Diagnosis not present

## 2017-04-26 DIAGNOSIS — E119 Type 2 diabetes mellitus without complications: Secondary | ICD-10-CM | POA: Diagnosis not present

## 2017-04-26 DIAGNOSIS — D509 Iron deficiency anemia, unspecified: Secondary | ICD-10-CM | POA: Diagnosis not present

## 2017-04-26 DIAGNOSIS — Z992 Dependence on renal dialysis: Secondary | ICD-10-CM | POA: Diagnosis not present

## 2017-04-26 DIAGNOSIS — N2581 Secondary hyperparathyroidism of renal origin: Secondary | ICD-10-CM | POA: Diagnosis not present

## 2017-04-29 DIAGNOSIS — D509 Iron deficiency anemia, unspecified: Secondary | ICD-10-CM | POA: Diagnosis not present

## 2017-04-29 DIAGNOSIS — N2581 Secondary hyperparathyroidism of renal origin: Secondary | ICD-10-CM | POA: Diagnosis not present

## 2017-04-29 DIAGNOSIS — E119 Type 2 diabetes mellitus without complications: Secondary | ICD-10-CM | POA: Diagnosis not present

## 2017-04-29 DIAGNOSIS — N186 End stage renal disease: Secondary | ICD-10-CM | POA: Diagnosis not present

## 2017-05-01 DIAGNOSIS — E119 Type 2 diabetes mellitus without complications: Secondary | ICD-10-CM | POA: Diagnosis not present

## 2017-05-01 DIAGNOSIS — N2581 Secondary hyperparathyroidism of renal origin: Secondary | ICD-10-CM | POA: Diagnosis not present

## 2017-05-01 DIAGNOSIS — N186 End stage renal disease: Secondary | ICD-10-CM | POA: Diagnosis not present

## 2017-05-01 DIAGNOSIS — D509 Iron deficiency anemia, unspecified: Secondary | ICD-10-CM | POA: Diagnosis not present

## 2017-05-03 DIAGNOSIS — D509 Iron deficiency anemia, unspecified: Secondary | ICD-10-CM | POA: Diagnosis not present

## 2017-05-03 DIAGNOSIS — E119 Type 2 diabetes mellitus without complications: Secondary | ICD-10-CM | POA: Diagnosis not present

## 2017-05-03 DIAGNOSIS — N186 End stage renal disease: Secondary | ICD-10-CM | POA: Diagnosis not present

## 2017-05-03 DIAGNOSIS — N2581 Secondary hyperparathyroidism of renal origin: Secondary | ICD-10-CM | POA: Diagnosis not present

## 2017-05-06 DIAGNOSIS — E119 Type 2 diabetes mellitus without complications: Secondary | ICD-10-CM | POA: Diagnosis not present

## 2017-05-06 DIAGNOSIS — D509 Iron deficiency anemia, unspecified: Secondary | ICD-10-CM | POA: Diagnosis not present

## 2017-05-06 DIAGNOSIS — N2581 Secondary hyperparathyroidism of renal origin: Secondary | ICD-10-CM | POA: Diagnosis not present

## 2017-05-06 DIAGNOSIS — N186 End stage renal disease: Secondary | ICD-10-CM | POA: Diagnosis not present

## 2017-05-08 DIAGNOSIS — N2581 Secondary hyperparathyroidism of renal origin: Secondary | ICD-10-CM | POA: Diagnosis not present

## 2017-05-08 DIAGNOSIS — N186 End stage renal disease: Secondary | ICD-10-CM | POA: Diagnosis not present

## 2017-05-08 DIAGNOSIS — E119 Type 2 diabetes mellitus without complications: Secondary | ICD-10-CM | POA: Diagnosis not present

## 2017-05-08 DIAGNOSIS — D509 Iron deficiency anemia, unspecified: Secondary | ICD-10-CM | POA: Diagnosis not present

## 2017-05-08 DIAGNOSIS — E1129 Type 2 diabetes mellitus with other diabetic kidney complication: Secondary | ICD-10-CM | POA: Diagnosis not present

## 2017-05-10 DIAGNOSIS — N2581 Secondary hyperparathyroidism of renal origin: Secondary | ICD-10-CM | POA: Diagnosis not present

## 2017-05-10 DIAGNOSIS — D509 Iron deficiency anemia, unspecified: Secondary | ICD-10-CM | POA: Diagnosis not present

## 2017-05-10 DIAGNOSIS — N186 End stage renal disease: Secondary | ICD-10-CM | POA: Diagnosis not present

## 2017-05-10 DIAGNOSIS — E119 Type 2 diabetes mellitus without complications: Secondary | ICD-10-CM | POA: Diagnosis not present

## 2017-05-12 ENCOUNTER — Ambulatory Visit (INDEPENDENT_AMBULATORY_CARE_PROVIDER_SITE_OTHER): Payer: Medicare Other | Admitting: Internal Medicine

## 2017-05-12 ENCOUNTER — Encounter (INDEPENDENT_AMBULATORY_CARE_PROVIDER_SITE_OTHER): Payer: Self-pay

## 2017-05-12 ENCOUNTER — Encounter: Payer: Self-pay | Admitting: Internal Medicine

## 2017-05-12 VITALS — BP 132/84 | HR 90 | Ht 68.0 in | Wt 183.0 lb

## 2017-05-12 DIAGNOSIS — Z95810 Presence of automatic (implantable) cardiac defibrillator: Secondary | ICD-10-CM

## 2017-05-12 DIAGNOSIS — I4901 Ventricular fibrillation: Secondary | ICD-10-CM

## 2017-05-12 LAB — CUP PACEART INCLINIC DEVICE CHECK
Battery Voltage: 2.63 V
Brady Statistic RV Percent Paced: 0.02 %
Date Time Interrogation Session: 20180716113945
HighPow Impedance: 45 Ohm
HighPow Impedance: 57 Ohm
Implantable Lead Implant Date: 20100415
Implantable Lead Location: 753860
Implantable Lead Model: 6947
Implantable Pulse Generator Implant Date: 20100415
Lead Channel Impedance Value: 437 Ohm
Lead Channel Sensing Intrinsic Amplitude: 2.75 mV
Lead Channel Sensing Intrinsic Amplitude: 3.375 mV
Lead Channel Setting Pacing Amplitude: 2.5 V
Lead Channel Setting Pacing Pulse Width: 0.8 ms
Lead Channel Setting Sensing Sensitivity: 0.3 mV

## 2017-05-12 NOTE — Progress Notes (Signed)
HPI James Nicholson returns today for for ongoing evaluation and management of his ICD. The patient is a very pleasant 51 year old man with multiple medical problems. He had a resuscitated ventricular fibrillation arrest several years ago and underwent ICD implantation. He has glomerulonephritis which has progressed to ESRD and HD. He has schizophrenia which appears to be quiet. He denies any recent ICD shocks. He has class II heart failure symptoms. He admits to a sedentary lifestyle and sodium indiscretion.  No Known Allergies   Current Outpatient Prescriptions  Medication Sig Dispense Refill  . calcium acetate (PHOSLO) 667 MG capsule Take 1,334 mg by mouth 3 (three) times daily with meals. 8a, 12p, 5p    . cetirizine-pseudoephedrine (ZYRTEC-D) 5-120 MG tablet Take 1 tablet by mouth 2 (two) times daily.    Marland Kitchen gabapentin (NEURONTIN) 300 MG capsule Take 300 mg by mouth 2 (two) times daily.    Marland Kitchen gemfibrozil (LOPID) 600 MG tablet Take 600 mg by mouth 2 (two) times daily before a meal.    . insulin detemir (LEVEMIR) 100 UNIT/ML injection Inject 0.05 mLs (5 Units total) into the skin at bedtime. 10 mL 0  . metoprolol succinate (TOPROL-XL) 50 MG 24 hr tablet Take 50 mg by mouth daily. Take with or immediately following a meal.     . omeprazole (PRILOSEC) 20 MG capsule Take 20 mg by mouth every morning.     . risperiDONE (RISPERDAL) 0.25 MG tablet Take 0.25 mg by mouth at bedtime.    . traZODone (DESYREL) 100 MG tablet Take 100 mg by mouth at bedtime.     No current facility-administered medications for this visit.      Past Medical History:  Diagnosis Date  . Anemia   . Atrial fibrillation   . Cardiomyopathy ([place]) 2010   Unclear Etiology: Last Echo 06/2009: EF 40-45%, severe Lateral & apical Hypokinesis (? CAD) ; Grade 2 DDysfxn. Mild conc LVH.   Marland Kitchen Cellulitis   . Chronic kidney disease   . Dyslipidemia   . Enterobacter sepsis ([place])   . Hyperlipidemia   . Hypertension   . S/P ICD  (internal cardiac defibrillator) procedure 2010   VT Arrest (in [place])  . Schizophrenia ([place])     ROS:   All systems reviewed and negative except as noted in the HPI.   Past Surgical History:  Procedure Laterality Date  . AV FISTULA PLACEMENT Right 02/10/2013   Procedure: ARTERIOVENOUS (AV) FISTULA CREATION;  Surgeon: Rosetta Posner, MD;  Location: [facility] OR;  Service: Vascular;  Laterality: Right;  Right forearm radial/cephalic arterovenous fistula.   Marland Kitchen CARDIAC CATHETERIZATION  2010   [place]: In setting of VT arrest. Per brother's report, nonobstructive with no intervention  . CARDIAC DEFIBRILLATOR PLACEMENT  2010   [place]  . FISTULOGRAM Right 08/09/2013   Procedure: FISTULOGRAM;  Surgeon: Angelia Mould, MD;  Location: [facility] CATH LAB;  Service: Cardiovascular;  Laterality: Right;  . LIGATION OF COMPETING BRANCHES OF ARTERIOVENOUS FISTULA Right 08/13/2013   Procedure: LIGATION OF COMPETING BRANCHES X5 OF ARTERIOVENOUS FISTULA- RIGHT ARM;  Surgeon: Serafina Mitchell, MD;  Location: [facility] OR;  Service: Vascular;  Laterality: Right;     Family History  Problem Relation Age of Onset  . CAD Mother      Social History   Social History  . Marital status: Single    Spouse name: N/A  . Number of children: N/A  . Years of education: N/A   Occupational History  . Not  on file.   Social History Main Topics  . Smoking status: Former Smoker    Quit date: 10/28/2008  . Smokeless tobacco: Never Used  . Alcohol use No  . Drug use: No  . Sexual activity: Not on file   Other Topics Concern  . Not on file   Social History Narrative  . No narrative on file     BP 132/84   Pulse 90   Ht 5\' 8"  (1.727 m)   Wt 183 lb (83 kg)   SpO2 95%   BMI 27.83 kg/m   Physical Exam:  Well appearing 52 yo man, NAD HEENT: Unremarkable Neck:  6 cm JVD, no thyromegally Back:  No CVA tenderness Lungs:  Clear with no wheezes HEART:  Regular rate rhythm, no murmurs, no rubs, no clicks Abd:   soft, positive bowel sounds, no organomegally, no rebound, no guarding Ext:  2 plus pulses, no edema, no cyanosis, no clubbing, right arm with fistula Skin:  No rashes no nodules Neuro:  CN II through XII intact, motor grossly intact   DEVICE  Normal device function.  See PaceArt for details.   Assess/Plan: 1. VF - he has had no recurrent ICD shocks. He does have NSVT which is asymptomtic. 2. HTN - his blood pressure is fairly well controlled today. No change in his meds. 3. ICD - His medtronic ICD is working normally. Will follow. 4. Chronic systolic heart failure - he admits to some sodium indiscretion. I have asked him to stop eating Ramen noodles.   James Nicholson.D.

## 2017-05-12 NOTE — Patient Instructions (Signed)
Medication Instructions:  Your physician recommends that you continue on your current medications as directed. Please refer to the Current Medication list given to you today.   Labwork: None Ordered   Testing/Procedures: None Ordered   Follow-Up: Your physician wants you to follow-up in: 1 year with Dr. Lovena Le. You will receive a reminder letter in the mail two months in advance. If you don't receive a letter, please call our office to schedule the follow-up appointment.  Remote monitoring is used to monitor your ICD from home. This monitoring reduces the number of office visits required to check your device to one time per year. It allows [place] to keep an eye on the functioning of your device to ensure it is working properly. You are scheduled for a device check from home on  08/11/17 . You may send your transmission at any time that day. If you have a wireless device, the transmission will be sent automatically. After your physician reviews your transmission, you will receive a postcard with your next transmission date.    Any Other Special Instructions Will Be Listed Below (If Applicable).     If you need a refill on your cardiac medications before your next appointment, please call your pharmacy.

## 2017-05-13 DIAGNOSIS — D509 Iron deficiency anemia, unspecified: Secondary | ICD-10-CM | POA: Diagnosis not present

## 2017-05-13 DIAGNOSIS — N2581 Secondary hyperparathyroidism of renal origin: Secondary | ICD-10-CM | POA: Diagnosis not present

## 2017-05-13 DIAGNOSIS — N186 End stage renal disease: Secondary | ICD-10-CM | POA: Diagnosis not present

## 2017-05-13 DIAGNOSIS — E119 Type 2 diabetes mellitus without complications: Secondary | ICD-10-CM | POA: Diagnosis not present

## 2017-05-15 DIAGNOSIS — E119 Type 2 diabetes mellitus without complications: Secondary | ICD-10-CM | POA: Diagnosis not present

## 2017-05-15 DIAGNOSIS — N186 End stage renal disease: Secondary | ICD-10-CM | POA: Diagnosis not present

## 2017-05-15 DIAGNOSIS — N2581 Secondary hyperparathyroidism of renal origin: Secondary | ICD-10-CM | POA: Diagnosis not present

## 2017-05-15 DIAGNOSIS — D509 Iron deficiency anemia, unspecified: Secondary | ICD-10-CM | POA: Diagnosis not present

## 2017-05-17 DIAGNOSIS — E119 Type 2 diabetes mellitus without complications: Secondary | ICD-10-CM | POA: Diagnosis not present

## 2017-05-17 DIAGNOSIS — N186 End stage renal disease: Secondary | ICD-10-CM | POA: Diagnosis not present

## 2017-05-17 DIAGNOSIS — D509 Iron deficiency anemia, unspecified: Secondary | ICD-10-CM | POA: Diagnosis not present

## 2017-05-17 DIAGNOSIS — N2581 Secondary hyperparathyroidism of renal origin: Secondary | ICD-10-CM | POA: Diagnosis not present

## 2017-05-20 DIAGNOSIS — D509 Iron deficiency anemia, unspecified: Secondary | ICD-10-CM | POA: Diagnosis not present

## 2017-05-20 DIAGNOSIS — N186 End stage renal disease: Secondary | ICD-10-CM | POA: Diagnosis not present

## 2017-05-20 DIAGNOSIS — N2581 Secondary hyperparathyroidism of renal origin: Secondary | ICD-10-CM | POA: Diagnosis not present

## 2017-05-20 DIAGNOSIS — E119 Type 2 diabetes mellitus without complications: Secondary | ICD-10-CM | POA: Diagnosis not present

## 2017-05-22 DIAGNOSIS — N186 End stage renal disease: Secondary | ICD-10-CM | POA: Diagnosis not present

## 2017-05-22 DIAGNOSIS — N2581 Secondary hyperparathyroidism of renal origin: Secondary | ICD-10-CM | POA: Diagnosis not present

## 2017-05-22 DIAGNOSIS — E119 Type 2 diabetes mellitus without complications: Secondary | ICD-10-CM | POA: Diagnosis not present

## 2017-05-22 DIAGNOSIS — D509 Iron deficiency anemia, unspecified: Secondary | ICD-10-CM | POA: Diagnosis not present

## 2017-05-24 DIAGNOSIS — E119 Type 2 diabetes mellitus without complications: Secondary | ICD-10-CM | POA: Diagnosis not present

## 2017-05-24 DIAGNOSIS — N2581 Secondary hyperparathyroidism of renal origin: Secondary | ICD-10-CM | POA: Diagnosis not present

## 2017-05-24 DIAGNOSIS — N186 End stage renal disease: Secondary | ICD-10-CM | POA: Diagnosis not present

## 2017-05-24 DIAGNOSIS — D509 Iron deficiency anemia, unspecified: Secondary | ICD-10-CM | POA: Diagnosis not present

## 2017-05-27 DIAGNOSIS — E119 Type 2 diabetes mellitus without complications: Secondary | ICD-10-CM | POA: Diagnosis not present

## 2017-05-27 DIAGNOSIS — N2581 Secondary hyperparathyroidism of renal origin: Secondary | ICD-10-CM | POA: Diagnosis not present

## 2017-05-27 DIAGNOSIS — N186 End stage renal disease: Secondary | ICD-10-CM | POA: Diagnosis not present

## 2017-05-27 DIAGNOSIS — N032 Chronic nephritic syndrome with diffuse membranous glomerulonephritis: Secondary | ICD-10-CM | POA: Diagnosis not present

## 2017-05-27 DIAGNOSIS — D509 Iron deficiency anemia, unspecified: Secondary | ICD-10-CM | POA: Diagnosis not present

## 2017-05-27 DIAGNOSIS — Z992 Dependence on renal dialysis: Secondary | ICD-10-CM | POA: Diagnosis not present

## 2017-05-29 DIAGNOSIS — E119 Type 2 diabetes mellitus without complications: Secondary | ICD-10-CM | POA: Diagnosis not present

## 2017-05-29 DIAGNOSIS — N186 End stage renal disease: Secondary | ICD-10-CM | POA: Diagnosis not present

## 2017-05-29 DIAGNOSIS — N2581 Secondary hyperparathyroidism of renal origin: Secondary | ICD-10-CM | POA: Diagnosis not present

## 2017-05-29 DIAGNOSIS — D509 Iron deficiency anemia, unspecified: Secondary | ICD-10-CM | POA: Diagnosis not present

## 2017-05-31 DIAGNOSIS — N2581 Secondary hyperparathyroidism of renal origin: Secondary | ICD-10-CM | POA: Diagnosis not present

## 2017-05-31 DIAGNOSIS — N186 End stage renal disease: Secondary | ICD-10-CM | POA: Diagnosis not present

## 2017-05-31 DIAGNOSIS — D509 Iron deficiency anemia, unspecified: Secondary | ICD-10-CM | POA: Diagnosis not present

## 2017-05-31 DIAGNOSIS — E119 Type 2 diabetes mellitus without complications: Secondary | ICD-10-CM | POA: Diagnosis not present

## 2017-06-03 DIAGNOSIS — E119 Type 2 diabetes mellitus without complications: Secondary | ICD-10-CM | POA: Diagnosis not present

## 2017-06-03 DIAGNOSIS — D509 Iron deficiency anemia, unspecified: Secondary | ICD-10-CM | POA: Diagnosis not present

## 2017-06-03 DIAGNOSIS — N186 End stage renal disease: Secondary | ICD-10-CM | POA: Diagnosis not present

## 2017-06-03 DIAGNOSIS — N2581 Secondary hyperparathyroidism of renal origin: Secondary | ICD-10-CM | POA: Diagnosis not present

## 2017-06-05 DIAGNOSIS — E119 Type 2 diabetes mellitus without complications: Secondary | ICD-10-CM | POA: Diagnosis not present

## 2017-06-05 DIAGNOSIS — N186 End stage renal disease: Secondary | ICD-10-CM | POA: Diagnosis not present

## 2017-06-05 DIAGNOSIS — D509 Iron deficiency anemia, unspecified: Secondary | ICD-10-CM | POA: Diagnosis not present

## 2017-06-05 DIAGNOSIS — N2581 Secondary hyperparathyroidism of renal origin: Secondary | ICD-10-CM | POA: Diagnosis not present

## 2017-06-07 DIAGNOSIS — N186 End stage renal disease: Secondary | ICD-10-CM | POA: Diagnosis not present

## 2017-06-07 DIAGNOSIS — E119 Type 2 diabetes mellitus without complications: Secondary | ICD-10-CM | POA: Diagnosis not present

## 2017-06-07 DIAGNOSIS — D509 Iron deficiency anemia, unspecified: Secondary | ICD-10-CM | POA: Diagnosis not present

## 2017-06-07 DIAGNOSIS — N2581 Secondary hyperparathyroidism of renal origin: Secondary | ICD-10-CM | POA: Diagnosis not present

## 2017-06-10 DIAGNOSIS — D509 Iron deficiency anemia, unspecified: Secondary | ICD-10-CM | POA: Diagnosis not present

## 2017-06-10 DIAGNOSIS — N186 End stage renal disease: Secondary | ICD-10-CM | POA: Diagnosis not present

## 2017-06-10 DIAGNOSIS — E119 Type 2 diabetes mellitus without complications: Secondary | ICD-10-CM | POA: Diagnosis not present

## 2017-06-10 DIAGNOSIS — N2581 Secondary hyperparathyroidism of renal origin: Secondary | ICD-10-CM | POA: Diagnosis not present

## 2017-06-12 ENCOUNTER — Encounter: Payer: Medicare Other | Admitting: *Deleted

## 2017-06-12 DIAGNOSIS — N186 End stage renal disease: Secondary | ICD-10-CM | POA: Diagnosis not present

## 2017-06-12 DIAGNOSIS — N2581 Secondary hyperparathyroidism of renal origin: Secondary | ICD-10-CM | POA: Diagnosis not present

## 2017-06-12 DIAGNOSIS — E119 Type 2 diabetes mellitus without complications: Secondary | ICD-10-CM | POA: Diagnosis not present

## 2017-06-12 DIAGNOSIS — D509 Iron deficiency anemia, unspecified: Secondary | ICD-10-CM | POA: Diagnosis not present

## 2017-06-14 DIAGNOSIS — N186 End stage renal disease: Secondary | ICD-10-CM | POA: Diagnosis not present

## 2017-06-14 DIAGNOSIS — E119 Type 2 diabetes mellitus without complications: Secondary | ICD-10-CM | POA: Diagnosis not present

## 2017-06-14 DIAGNOSIS — N2581 Secondary hyperparathyroidism of renal origin: Secondary | ICD-10-CM | POA: Diagnosis not present

## 2017-06-14 DIAGNOSIS — D509 Iron deficiency anemia, unspecified: Secondary | ICD-10-CM | POA: Diagnosis not present

## 2017-06-17 DIAGNOSIS — D509 Iron deficiency anemia, unspecified: Secondary | ICD-10-CM | POA: Diagnosis not present

## 2017-06-17 DIAGNOSIS — N2581 Secondary hyperparathyroidism of renal origin: Secondary | ICD-10-CM | POA: Diagnosis not present

## 2017-06-17 DIAGNOSIS — E119 Type 2 diabetes mellitus without complications: Secondary | ICD-10-CM | POA: Diagnosis not present

## 2017-06-17 DIAGNOSIS — N186 End stage renal disease: Secondary | ICD-10-CM | POA: Diagnosis not present

## 2017-06-18 ENCOUNTER — Encounter: Payer: Self-pay | Admitting: Cardiology

## 2017-06-19 DIAGNOSIS — D509 Iron deficiency anemia, unspecified: Secondary | ICD-10-CM | POA: Diagnosis not present

## 2017-06-19 DIAGNOSIS — N186 End stage renal disease: Secondary | ICD-10-CM | POA: Diagnosis not present

## 2017-06-19 DIAGNOSIS — E119 Type 2 diabetes mellitus without complications: Secondary | ICD-10-CM | POA: Diagnosis not present

## 2017-06-19 DIAGNOSIS — N2581 Secondary hyperparathyroidism of renal origin: Secondary | ICD-10-CM | POA: Diagnosis not present

## 2017-06-21 DIAGNOSIS — N2581 Secondary hyperparathyroidism of renal origin: Secondary | ICD-10-CM | POA: Diagnosis not present

## 2017-06-21 DIAGNOSIS — D509 Iron deficiency anemia, unspecified: Secondary | ICD-10-CM | POA: Diagnosis not present

## 2017-06-21 DIAGNOSIS — E119 Type 2 diabetes mellitus without complications: Secondary | ICD-10-CM | POA: Diagnosis not present

## 2017-06-21 DIAGNOSIS — N186 End stage renal disease: Secondary | ICD-10-CM | POA: Diagnosis not present

## 2017-06-24 DIAGNOSIS — E119 Type 2 diabetes mellitus without complications: Secondary | ICD-10-CM | POA: Diagnosis not present

## 2017-06-24 DIAGNOSIS — D509 Iron deficiency anemia, unspecified: Secondary | ICD-10-CM | POA: Diagnosis not present

## 2017-06-24 DIAGNOSIS — N186 End stage renal disease: Secondary | ICD-10-CM | POA: Diagnosis not present

## 2017-06-24 DIAGNOSIS — N2581 Secondary hyperparathyroidism of renal origin: Secondary | ICD-10-CM | POA: Diagnosis not present

## 2017-06-26 DIAGNOSIS — D509 Iron deficiency anemia, unspecified: Secondary | ICD-10-CM | POA: Diagnosis not present

## 2017-06-26 DIAGNOSIS — E119 Type 2 diabetes mellitus without complications: Secondary | ICD-10-CM | POA: Diagnosis not present

## 2017-06-26 DIAGNOSIS — N2581 Secondary hyperparathyroidism of renal origin: Secondary | ICD-10-CM | POA: Diagnosis not present

## 2017-06-26 DIAGNOSIS — N186 End stage renal disease: Secondary | ICD-10-CM | POA: Diagnosis not present

## 2017-06-27 DIAGNOSIS — N186 End stage renal disease: Secondary | ICD-10-CM | POA: Diagnosis not present

## 2017-06-27 DIAGNOSIS — Z992 Dependence on renal dialysis: Secondary | ICD-10-CM | POA: Diagnosis not present

## 2017-06-28 DIAGNOSIS — N186 End stage renal disease: Secondary | ICD-10-CM | POA: Diagnosis not present

## 2017-06-28 DIAGNOSIS — E119 Type 2 diabetes mellitus without complications: Secondary | ICD-10-CM | POA: Diagnosis not present

## 2017-06-28 DIAGNOSIS — 419620001 Death: Secondary | SNOMED CT | POA: Diagnosis not present

## 2017-06-28 DIAGNOSIS — D509 Iron deficiency anemia, unspecified: Secondary | ICD-10-CM | POA: Diagnosis not present

## 2017-06-28 DIAGNOSIS — N2581 Secondary hyperparathyroidism of renal origin: Secondary | ICD-10-CM | POA: Diagnosis not present

## 2017-06-28 DEATH — deceased

## 2017-07-01 ENCOUNTER — Ambulatory Visit: Payer: Medicare Other | Admitting: *Deleted

## 2017-07-01 DIAGNOSIS — N2581 Secondary hyperparathyroidism of renal origin: Secondary | ICD-10-CM | POA: Diagnosis not present

## 2017-07-01 DIAGNOSIS — D509 Iron deficiency anemia, unspecified: Secondary | ICD-10-CM | POA: Diagnosis not present

## 2017-07-01 DIAGNOSIS — Z95810 Presence of automatic (implantable) cardiac defibrillator: Secondary | ICD-10-CM

## 2017-07-01 DIAGNOSIS — N186 End stage renal disease: Secondary | ICD-10-CM | POA: Diagnosis not present

## 2017-07-01 DIAGNOSIS — E119 Type 2 diabetes mellitus without complications: Secondary | ICD-10-CM | POA: Diagnosis not present

## 2017-07-03 DIAGNOSIS — N186 End stage renal disease: Secondary | ICD-10-CM | POA: Diagnosis not present

## 2017-07-03 DIAGNOSIS — N2581 Secondary hyperparathyroidism of renal origin: Secondary | ICD-10-CM | POA: Diagnosis not present

## 2017-07-03 DIAGNOSIS — E119 Type 2 diabetes mellitus without complications: Secondary | ICD-10-CM | POA: Diagnosis not present

## 2017-07-03 DIAGNOSIS — D509 Iron deficiency anemia, unspecified: Secondary | ICD-10-CM | POA: Diagnosis not present

## 2017-07-03 NOTE — Progress Notes (Signed)
Remote ICD transmission.   

## 2017-07-05 DIAGNOSIS — N186 End stage renal disease: Secondary | ICD-10-CM | POA: Diagnosis not present

## 2017-07-05 DIAGNOSIS — E119 Type 2 diabetes mellitus without complications: Secondary | ICD-10-CM | POA: Diagnosis not present

## 2017-07-05 DIAGNOSIS — D509 Iron deficiency anemia, unspecified: Secondary | ICD-10-CM | POA: Diagnosis not present

## 2017-07-05 DIAGNOSIS — N2581 Secondary hyperparathyroidism of renal origin: Secondary | ICD-10-CM | POA: Diagnosis not present

## 2017-07-08 DIAGNOSIS — E119 Type 2 diabetes mellitus without complications: Secondary | ICD-10-CM | POA: Diagnosis not present

## 2017-07-08 DIAGNOSIS — D509 Iron deficiency anemia, unspecified: Secondary | ICD-10-CM | POA: Diagnosis not present

## 2017-07-08 DIAGNOSIS — N2581 Secondary hyperparathyroidism of renal origin: Secondary | ICD-10-CM | POA: Diagnosis not present

## 2017-07-08 DIAGNOSIS — N186 End stage renal disease: Secondary | ICD-10-CM | POA: Diagnosis not present

## 2017-07-09 ENCOUNTER — Encounter: Payer: Self-pay | Admitting: Cardiology

## 2017-07-10 DIAGNOSIS — D509 Iron deficiency anemia, unspecified: Secondary | ICD-10-CM | POA: Diagnosis not present

## 2017-07-10 DIAGNOSIS — N186 End stage renal disease: Secondary | ICD-10-CM | POA: Diagnosis not present

## 2017-07-10 DIAGNOSIS — E119 Type 2 diabetes mellitus without complications: Secondary | ICD-10-CM | POA: Diagnosis not present

## 2017-07-10 DIAGNOSIS — N2581 Secondary hyperparathyroidism of renal origin: Secondary | ICD-10-CM | POA: Diagnosis not present

## 2017-07-11 LAB — CUP PACEART REMOTE DEVICE CHECK
Battery Voltage: 2.62 V
Brady Statistic RV Percent Paced: 0.01 %
Date Time Interrogation Session: 20180904105616
HighPow Impedance: 43 Ohm
HighPow Impedance: 57 Ohm
Implantable Lead Implant Date: 20100415
Implantable Lead Location: 753860
Implantable Lead Model: 6947
Implantable Pulse Generator Implant Date: 20100415
Lead Channel Impedance Value: 437 Ohm
Lead Channel Sensing Intrinsic Amplitude: 2.875 mV
Lead Channel Sensing Intrinsic Amplitude: 2.875 mV
Lead Channel Setting Pacing Amplitude: 2.5 V
Lead Channel Setting Pacing Pulse Width: 0.8 ms
Lead Channel Setting Sensing Sensitivity: 0.3 mV

## 2017-07-12 DIAGNOSIS — E119 Type 2 diabetes mellitus without complications: Secondary | ICD-10-CM | POA: Diagnosis not present

## 2017-07-12 DIAGNOSIS — N2581 Secondary hyperparathyroidism of renal origin: Secondary | ICD-10-CM | POA: Diagnosis not present

## 2017-07-12 DIAGNOSIS — N186 End stage renal disease: Secondary | ICD-10-CM | POA: Diagnosis not present

## 2017-07-12 DIAGNOSIS — D509 Iron deficiency anemia, unspecified: Secondary | ICD-10-CM | POA: Diagnosis not present

## 2017-07-15 DIAGNOSIS — D509 Iron deficiency anemia, unspecified: Secondary | ICD-10-CM | POA: Diagnosis not present

## 2017-07-15 DIAGNOSIS — N2581 Secondary hyperparathyroidism of renal origin: Secondary | ICD-10-CM | POA: Diagnosis not present

## 2017-07-15 DIAGNOSIS — N186 End stage renal disease: Secondary | ICD-10-CM | POA: Diagnosis not present

## 2017-07-15 DIAGNOSIS — E119 Type 2 diabetes mellitus without complications: Secondary | ICD-10-CM | POA: Diagnosis not present

## 2017-07-17 DIAGNOSIS — N2581 Secondary hyperparathyroidism of renal origin: Secondary | ICD-10-CM | POA: Diagnosis not present

## 2017-07-17 DIAGNOSIS — E119 Type 2 diabetes mellitus without complications: Secondary | ICD-10-CM | POA: Diagnosis not present

## 2017-07-17 DIAGNOSIS — D509 Iron deficiency anemia, unspecified: Secondary | ICD-10-CM | POA: Diagnosis not present

## 2017-07-17 DIAGNOSIS — N186 End stage renal disease: Secondary | ICD-10-CM | POA: Diagnosis not present

## 2017-07-19 DIAGNOSIS — N2581 Secondary hyperparathyroidism of renal origin: Secondary | ICD-10-CM | POA: Diagnosis not present

## 2017-07-19 DIAGNOSIS — E119 Type 2 diabetes mellitus without complications: Secondary | ICD-10-CM | POA: Diagnosis not present

## 2017-07-19 DIAGNOSIS — N186 End stage renal disease: Secondary | ICD-10-CM | POA: Diagnosis not present

## 2017-07-19 DIAGNOSIS — D509 Iron deficiency anemia, unspecified: Secondary | ICD-10-CM | POA: Diagnosis not present

## 2017-07-22 DIAGNOSIS — N186 End stage renal disease: Secondary | ICD-10-CM | POA: Diagnosis not present

## 2017-07-22 DIAGNOSIS — E119 Type 2 diabetes mellitus without complications: Secondary | ICD-10-CM | POA: Diagnosis not present

## 2017-07-22 DIAGNOSIS — N2581 Secondary hyperparathyroidism of renal origin: Secondary | ICD-10-CM | POA: Diagnosis not present

## 2017-07-22 DIAGNOSIS — D509 Iron deficiency anemia, unspecified: Secondary | ICD-10-CM | POA: Diagnosis not present

## 2017-07-24 DIAGNOSIS — E119 Type 2 diabetes mellitus without complications: Secondary | ICD-10-CM | POA: Diagnosis not present

## 2017-07-24 DIAGNOSIS — D509 Iron deficiency anemia, unspecified: Secondary | ICD-10-CM | POA: Diagnosis not present

## 2017-07-24 DIAGNOSIS — N186 End stage renal disease: Secondary | ICD-10-CM | POA: Diagnosis not present

## 2017-07-24 DIAGNOSIS — N2581 Secondary hyperparathyroidism of renal origin: Secondary | ICD-10-CM | POA: Diagnosis not present

## 2017-07-26 DIAGNOSIS — N2581 Secondary hyperparathyroidism of renal origin: Secondary | ICD-10-CM | POA: Diagnosis not present

## 2017-07-26 DIAGNOSIS — N186 End stage renal disease: Secondary | ICD-10-CM | POA: Diagnosis not present

## 2017-07-26 DIAGNOSIS — D509 Iron deficiency anemia, unspecified: Secondary | ICD-10-CM | POA: Diagnosis not present

## 2017-07-26 DIAGNOSIS — E119 Type 2 diabetes mellitus without complications: Secondary | ICD-10-CM | POA: Diagnosis not present

## 2017-07-27 DIAGNOSIS — Z992 Dependence on renal dialysis: Secondary | ICD-10-CM | POA: Diagnosis not present

## 2017-07-27 DIAGNOSIS — N032 Chronic nephritic syndrome with diffuse membranous glomerulonephritis: Secondary | ICD-10-CM | POA: Diagnosis not present

## 2017-07-27 DIAGNOSIS — N186 End stage renal disease: Secondary | ICD-10-CM | POA: Diagnosis not present

## 2017-07-29 DIAGNOSIS — Z23 Encounter for immunization: Secondary | ICD-10-CM | POA: Diagnosis not present

## 2017-07-29 DIAGNOSIS — E119 Type 2 diabetes mellitus without complications: Secondary | ICD-10-CM | POA: Diagnosis not present

## 2017-07-29 DIAGNOSIS — N186 End stage renal disease: Secondary | ICD-10-CM | POA: Diagnosis not present

## 2017-07-29 DIAGNOSIS — N2581 Secondary hyperparathyroidism of renal origin: Secondary | ICD-10-CM | POA: Diagnosis not present

## 2017-07-29 DIAGNOSIS — D509 Iron deficiency anemia, unspecified: Secondary | ICD-10-CM | POA: Diagnosis not present

## 2017-07-30 DIAGNOSIS — F250 Schizoaffective disorder, bipolar type: Secondary | ICD-10-CM | POA: Diagnosis not present

## 2017-07-31 DIAGNOSIS — E119 Type 2 diabetes mellitus without complications: Secondary | ICD-10-CM | POA: Diagnosis not present

## 2017-07-31 DIAGNOSIS — Z23 Encounter for immunization: Secondary | ICD-10-CM | POA: Diagnosis not present

## 2017-07-31 DIAGNOSIS — D509 Iron deficiency anemia, unspecified: Secondary | ICD-10-CM | POA: Diagnosis not present

## 2017-07-31 DIAGNOSIS — N2581 Secondary hyperparathyroidism of renal origin: Secondary | ICD-10-CM | POA: Diagnosis not present

## 2017-07-31 DIAGNOSIS — N186 End stage renal disease: Secondary | ICD-10-CM | POA: Diagnosis not present

## 2017-08-02 DIAGNOSIS — D509 Iron deficiency anemia, unspecified: Secondary | ICD-10-CM | POA: Diagnosis not present

## 2017-08-02 DIAGNOSIS — E119 Type 2 diabetes mellitus without complications: Secondary | ICD-10-CM | POA: Diagnosis not present

## 2017-08-02 DIAGNOSIS — N2581 Secondary hyperparathyroidism of renal origin: Secondary | ICD-10-CM | POA: Diagnosis not present

## 2017-08-02 DIAGNOSIS — Z23 Encounter for immunization: Secondary | ICD-10-CM | POA: Diagnosis not present

## 2017-08-02 DIAGNOSIS — N186 End stage renal disease: Secondary | ICD-10-CM | POA: Diagnosis not present

## 2017-08-04 ENCOUNTER — Telehealth: Payer: Self-pay | Admitting: Cardiology

## 2017-08-04 ENCOUNTER — Encounter: Payer: Medicare Other | Admitting: *Deleted

## 2017-08-04 NOTE — Telephone Encounter (Signed)
Attempted to confirm remote transmission with pt. No answer and was unable to leave a message.   

## 2017-08-05 DIAGNOSIS — Z23 Encounter for immunization: Secondary | ICD-10-CM | POA: Diagnosis not present

## 2017-08-05 DIAGNOSIS — D509 Iron deficiency anemia, unspecified: Secondary | ICD-10-CM | POA: Diagnosis not present

## 2017-08-05 DIAGNOSIS — N186 End stage renal disease: Secondary | ICD-10-CM | POA: Diagnosis not present

## 2017-08-05 DIAGNOSIS — E119 Type 2 diabetes mellitus without complications: Secondary | ICD-10-CM | POA: Diagnosis not present

## 2017-08-05 DIAGNOSIS — N2581 Secondary hyperparathyroidism of renal origin: Secondary | ICD-10-CM | POA: Diagnosis not present

## 2017-08-07 DIAGNOSIS — E1129 Type 2 diabetes mellitus with other diabetic kidney complication: Secondary | ICD-10-CM | POA: Diagnosis not present

## 2017-08-07 DIAGNOSIS — E119 Type 2 diabetes mellitus without complications: Secondary | ICD-10-CM | POA: Diagnosis not present

## 2017-08-07 DIAGNOSIS — D509 Iron deficiency anemia, unspecified: Secondary | ICD-10-CM | POA: Diagnosis not present

## 2017-08-07 DIAGNOSIS — N186 End stage renal disease: Secondary | ICD-10-CM | POA: Diagnosis not present

## 2017-08-07 DIAGNOSIS — Z23 Encounter for immunization: Secondary | ICD-10-CM | POA: Diagnosis not present

## 2017-08-07 DIAGNOSIS — N2581 Secondary hyperparathyroidism of renal origin: Secondary | ICD-10-CM | POA: Diagnosis not present

## 2017-08-08 ENCOUNTER — Encounter: Payer: Self-pay | Admitting: Cardiology

## 2017-08-09 DIAGNOSIS — E119 Type 2 diabetes mellitus without complications: Secondary | ICD-10-CM | POA: Diagnosis not present

## 2017-08-09 DIAGNOSIS — Z23 Encounter for immunization: Secondary | ICD-10-CM | POA: Diagnosis not present

## 2017-08-09 DIAGNOSIS — D509 Iron deficiency anemia, unspecified: Secondary | ICD-10-CM | POA: Diagnosis not present

## 2017-08-09 DIAGNOSIS — N186 End stage renal disease: Secondary | ICD-10-CM | POA: Diagnosis not present

## 2017-08-09 DIAGNOSIS — N2581 Secondary hyperparathyroidism of renal origin: Secondary | ICD-10-CM | POA: Diagnosis not present

## 2017-08-12 DIAGNOSIS — N2581 Secondary hyperparathyroidism of renal origin: Secondary | ICD-10-CM | POA: Diagnosis not present

## 2017-08-12 DIAGNOSIS — N186 End stage renal disease: Secondary | ICD-10-CM | POA: Diagnosis not present

## 2017-08-12 DIAGNOSIS — E119 Type 2 diabetes mellitus without complications: Secondary | ICD-10-CM | POA: Diagnosis not present

## 2017-08-12 DIAGNOSIS — D509 Iron deficiency anemia, unspecified: Secondary | ICD-10-CM | POA: Diagnosis not present

## 2017-08-12 DIAGNOSIS — Z23 Encounter for immunization: Secondary | ICD-10-CM | POA: Diagnosis not present

## 2017-08-14 DIAGNOSIS — N186 End stage renal disease: Secondary | ICD-10-CM | POA: Diagnosis not present

## 2017-08-14 DIAGNOSIS — Z23 Encounter for immunization: Secondary | ICD-10-CM | POA: Diagnosis not present

## 2017-08-14 DIAGNOSIS — E119 Type 2 diabetes mellitus without complications: Secondary | ICD-10-CM | POA: Diagnosis not present

## 2017-08-14 DIAGNOSIS — N2581 Secondary hyperparathyroidism of renal origin: Secondary | ICD-10-CM | POA: Diagnosis not present

## 2017-08-14 DIAGNOSIS — D509 Iron deficiency anemia, unspecified: Secondary | ICD-10-CM | POA: Diagnosis not present

## 2017-08-16 DIAGNOSIS — Z23 Encounter for immunization: Secondary | ICD-10-CM | POA: Diagnosis not present

## 2017-08-16 DIAGNOSIS — N2581 Secondary hyperparathyroidism of renal origin: Secondary | ICD-10-CM | POA: Diagnosis not present

## 2017-08-16 DIAGNOSIS — E119 Type 2 diabetes mellitus without complications: Secondary | ICD-10-CM | POA: Diagnosis not present

## 2017-08-16 DIAGNOSIS — D509 Iron deficiency anemia, unspecified: Secondary | ICD-10-CM | POA: Diagnosis not present

## 2017-08-16 DIAGNOSIS — N186 End stage renal disease: Secondary | ICD-10-CM | POA: Diagnosis not present

## 2017-08-19 DIAGNOSIS — N2581 Secondary hyperparathyroidism of renal origin: Secondary | ICD-10-CM | POA: Diagnosis not present

## 2017-08-19 DIAGNOSIS — N186 End stage renal disease: Secondary | ICD-10-CM | POA: Diagnosis not present

## 2017-08-19 DIAGNOSIS — Z23 Encounter for immunization: Secondary | ICD-10-CM | POA: Diagnosis not present

## 2017-08-19 DIAGNOSIS — E119 Type 2 diabetes mellitus without complications: Secondary | ICD-10-CM | POA: Diagnosis not present

## 2017-08-19 DIAGNOSIS — D509 Iron deficiency anemia, unspecified: Secondary | ICD-10-CM | POA: Diagnosis not present

## 2017-08-21 DIAGNOSIS — N186 End stage renal disease: Secondary | ICD-10-CM | POA: Diagnosis not present

## 2017-08-21 DIAGNOSIS — E119 Type 2 diabetes mellitus without complications: Secondary | ICD-10-CM | POA: Diagnosis not present

## 2017-08-21 DIAGNOSIS — Z23 Encounter for immunization: Secondary | ICD-10-CM | POA: Diagnosis not present

## 2017-08-21 DIAGNOSIS — D509 Iron deficiency anemia, unspecified: Secondary | ICD-10-CM | POA: Diagnosis not present

## 2017-08-21 DIAGNOSIS — N2581 Secondary hyperparathyroidism of renal origin: Secondary | ICD-10-CM | POA: Diagnosis not present

## 2017-08-23 DIAGNOSIS — N186 End stage renal disease: Secondary | ICD-10-CM | POA: Diagnosis not present

## 2017-08-23 DIAGNOSIS — D509 Iron deficiency anemia, unspecified: Secondary | ICD-10-CM | POA: Diagnosis not present

## 2017-08-23 DIAGNOSIS — E119 Type 2 diabetes mellitus without complications: Secondary | ICD-10-CM | POA: Diagnosis not present

## 2017-08-23 DIAGNOSIS — Z23 Encounter for immunization: Secondary | ICD-10-CM | POA: Diagnosis not present

## 2017-08-23 DIAGNOSIS — N2581 Secondary hyperparathyroidism of renal origin: Secondary | ICD-10-CM | POA: Diagnosis not present

## 2017-08-26 DIAGNOSIS — N2581 Secondary hyperparathyroidism of renal origin: Secondary | ICD-10-CM | POA: Diagnosis not present

## 2017-08-26 DIAGNOSIS — E119 Type 2 diabetes mellitus without complications: Secondary | ICD-10-CM | POA: Diagnosis not present

## 2017-08-26 DIAGNOSIS — D509 Iron deficiency anemia, unspecified: Secondary | ICD-10-CM | POA: Diagnosis not present

## 2017-08-26 DIAGNOSIS — N186 End stage renal disease: Secondary | ICD-10-CM | POA: Diagnosis not present

## 2017-08-26 DIAGNOSIS — Z23 Encounter for immunization: Secondary | ICD-10-CM | POA: Diagnosis not present

## 2017-08-27 DIAGNOSIS — Z992 Dependence on renal dialysis: Secondary | ICD-10-CM | POA: Diagnosis not present

## 2017-08-27 DIAGNOSIS — N032 Chronic nephritic syndrome with diffuse membranous glomerulonephritis: Secondary | ICD-10-CM | POA: Diagnosis not present

## 2017-08-27 DIAGNOSIS — N186 End stage renal disease: Secondary | ICD-10-CM | POA: Diagnosis not present

## 2017-08-28 DIAGNOSIS — E119 Type 2 diabetes mellitus without complications: Secondary | ICD-10-CM | POA: Diagnosis not present

## 2017-08-28 DIAGNOSIS — N186 End stage renal disease: Secondary | ICD-10-CM | POA: Diagnosis not present

## 2017-08-28 DIAGNOSIS — N2581 Secondary hyperparathyroidism of renal origin: Secondary | ICD-10-CM | POA: Diagnosis not present

## 2017-08-28 DIAGNOSIS — D509 Iron deficiency anemia, unspecified: Secondary | ICD-10-CM | POA: Diagnosis not present

## 2017-08-30 DIAGNOSIS — D509 Iron deficiency anemia, unspecified: Secondary | ICD-10-CM | POA: Diagnosis not present

## 2017-08-30 DIAGNOSIS — N186 End stage renal disease: Secondary | ICD-10-CM | POA: Diagnosis not present

## 2017-08-30 DIAGNOSIS — N2581 Secondary hyperparathyroidism of renal origin: Secondary | ICD-10-CM | POA: Diagnosis not present

## 2017-08-30 DIAGNOSIS — E119 Type 2 diabetes mellitus without complications: Secondary | ICD-10-CM | POA: Diagnosis not present

## 2017-09-02 DIAGNOSIS — N2581 Secondary hyperparathyroidism of renal origin: Secondary | ICD-10-CM | POA: Diagnosis not present

## 2017-09-02 DIAGNOSIS — E119 Type 2 diabetes mellitus without complications: Secondary | ICD-10-CM | POA: Diagnosis not present

## 2017-09-02 DIAGNOSIS — D509 Iron deficiency anemia, unspecified: Secondary | ICD-10-CM | POA: Diagnosis not present

## 2017-09-02 DIAGNOSIS — N186 End stage renal disease: Secondary | ICD-10-CM | POA: Diagnosis not present

## 2017-09-04 DIAGNOSIS — E119 Type 2 diabetes mellitus without complications: Secondary | ICD-10-CM | POA: Diagnosis not present

## 2017-09-04 DIAGNOSIS — N2581 Secondary hyperparathyroidism of renal origin: Secondary | ICD-10-CM | POA: Diagnosis not present

## 2017-09-04 DIAGNOSIS — N186 End stage renal disease: Secondary | ICD-10-CM | POA: Diagnosis not present

## 2017-09-04 DIAGNOSIS — D509 Iron deficiency anemia, unspecified: Secondary | ICD-10-CM | POA: Diagnosis not present

## 2017-09-06 DIAGNOSIS — N2581 Secondary hyperparathyroidism of renal origin: Secondary | ICD-10-CM | POA: Diagnosis not present

## 2017-09-06 DIAGNOSIS — N186 End stage renal disease: Secondary | ICD-10-CM | POA: Diagnosis not present

## 2017-09-06 DIAGNOSIS — D509 Iron deficiency anemia, unspecified: Secondary | ICD-10-CM | POA: Diagnosis not present

## 2017-09-06 DIAGNOSIS — E119 Type 2 diabetes mellitus without complications: Secondary | ICD-10-CM | POA: Diagnosis not present

## 2017-09-09 DIAGNOSIS — N186 End stage renal disease: Secondary | ICD-10-CM | POA: Diagnosis not present

## 2017-09-09 DIAGNOSIS — N2581 Secondary hyperparathyroidism of renal origin: Secondary | ICD-10-CM | POA: Diagnosis not present

## 2017-09-09 DIAGNOSIS — E119 Type 2 diabetes mellitus without complications: Secondary | ICD-10-CM | POA: Diagnosis not present

## 2017-09-09 DIAGNOSIS — D509 Iron deficiency anemia, unspecified: Secondary | ICD-10-CM | POA: Diagnosis not present

## 2017-09-11 DIAGNOSIS — N2581 Secondary hyperparathyroidism of renal origin: Secondary | ICD-10-CM | POA: Diagnosis not present

## 2017-09-11 DIAGNOSIS — D509 Iron deficiency anemia, unspecified: Secondary | ICD-10-CM | POA: Diagnosis not present

## 2017-09-11 DIAGNOSIS — N186 End stage renal disease: Secondary | ICD-10-CM | POA: Diagnosis not present

## 2017-09-11 DIAGNOSIS — E119 Type 2 diabetes mellitus without complications: Secondary | ICD-10-CM | POA: Diagnosis not present

## 2017-09-13 DIAGNOSIS — N2581 Secondary hyperparathyroidism of renal origin: Secondary | ICD-10-CM | POA: Diagnosis not present

## 2017-09-13 DIAGNOSIS — N186 End stage renal disease: Secondary | ICD-10-CM | POA: Diagnosis not present

## 2017-09-13 DIAGNOSIS — D509 Iron deficiency anemia, unspecified: Secondary | ICD-10-CM | POA: Diagnosis not present

## 2017-09-13 DIAGNOSIS — E119 Type 2 diabetes mellitus without complications: Secondary | ICD-10-CM | POA: Diagnosis not present

## 2017-09-15 DIAGNOSIS — N186 End stage renal disease: Secondary | ICD-10-CM | POA: Diagnosis not present

## 2017-09-15 DIAGNOSIS — E119 Type 2 diabetes mellitus without complications: Secondary | ICD-10-CM | POA: Diagnosis not present

## 2017-09-15 DIAGNOSIS — D509 Iron deficiency anemia, unspecified: Secondary | ICD-10-CM | POA: Diagnosis not present

## 2017-09-15 DIAGNOSIS — N2581 Secondary hyperparathyroidism of renal origin: Secondary | ICD-10-CM | POA: Diagnosis not present

## 2017-09-17 DIAGNOSIS — N186 End stage renal disease: Secondary | ICD-10-CM | POA: Diagnosis not present

## 2017-09-17 DIAGNOSIS — D509 Iron deficiency anemia, unspecified: Secondary | ICD-10-CM | POA: Diagnosis not present

## 2017-09-17 DIAGNOSIS — E119 Type 2 diabetes mellitus without complications: Secondary | ICD-10-CM | POA: Diagnosis not present

## 2017-09-17 DIAGNOSIS — N2581 Secondary hyperparathyroidism of renal origin: Secondary | ICD-10-CM | POA: Diagnosis not present

## 2017-09-20 DIAGNOSIS — N2581 Secondary hyperparathyroidism of renal origin: Secondary | ICD-10-CM | POA: Diagnosis not present

## 2017-09-20 DIAGNOSIS — E119 Type 2 diabetes mellitus without complications: Secondary | ICD-10-CM | POA: Diagnosis not present

## 2017-09-20 DIAGNOSIS — N186 End stage renal disease: Secondary | ICD-10-CM | POA: Diagnosis not present

## 2017-09-20 DIAGNOSIS — D509 Iron deficiency anemia, unspecified: Secondary | ICD-10-CM | POA: Diagnosis not present

## 2017-09-23 DIAGNOSIS — E119 Type 2 diabetes mellitus without complications: Secondary | ICD-10-CM | POA: Diagnosis not present

## 2017-09-23 DIAGNOSIS — D509 Iron deficiency anemia, unspecified: Secondary | ICD-10-CM | POA: Diagnosis not present

## 2017-09-23 DIAGNOSIS — N186 End stage renal disease: Secondary | ICD-10-CM | POA: Diagnosis not present

## 2017-09-23 DIAGNOSIS — N2581 Secondary hyperparathyroidism of renal origin: Secondary | ICD-10-CM | POA: Diagnosis not present

## 2017-09-25 DIAGNOSIS — N186 End stage renal disease: Secondary | ICD-10-CM | POA: Diagnosis not present

## 2017-09-25 DIAGNOSIS — E119 Type 2 diabetes mellitus without complications: Secondary | ICD-10-CM | POA: Diagnosis not present

## 2017-09-25 DIAGNOSIS — D509 Iron deficiency anemia, unspecified: Secondary | ICD-10-CM | POA: Diagnosis not present

## 2017-09-25 DIAGNOSIS — N2581 Secondary hyperparathyroidism of renal origin: Secondary | ICD-10-CM | POA: Diagnosis not present

## 2017-09-26 DIAGNOSIS — Z992 Dependence on renal dialysis: Secondary | ICD-10-CM | POA: Diagnosis not present

## 2017-09-26 DIAGNOSIS — N186 End stage renal disease: Secondary | ICD-10-CM | POA: Diagnosis not present

## 2017-09-26 DIAGNOSIS — N032 Chronic nephritic syndrome with diffuse membranous glomerulonephritis: Secondary | ICD-10-CM | POA: Diagnosis not present

## 2017-09-27 DIAGNOSIS — D509 Iron deficiency anemia, unspecified: Secondary | ICD-10-CM | POA: Diagnosis not present

## 2017-09-27 DIAGNOSIS — N2581 Secondary hyperparathyroidism of renal origin: Secondary | ICD-10-CM | POA: Diagnosis not present

## 2017-09-27 DIAGNOSIS — E119 Type 2 diabetes mellitus without complications: Secondary | ICD-10-CM | POA: Diagnosis not present

## 2017-09-27 DIAGNOSIS — N186 End stage renal disease: Secondary | ICD-10-CM | POA: Diagnosis not present

## 2017-09-30 ENCOUNTER — Encounter: Payer: Medicare Other | Admitting: *Deleted

## 2017-09-30 ENCOUNTER — Telehealth: Payer: Self-pay | Admitting: Cardiology

## 2017-09-30 DIAGNOSIS — N186 End stage renal disease: Secondary | ICD-10-CM | POA: Diagnosis not present

## 2017-09-30 DIAGNOSIS — N2581 Secondary hyperparathyroidism of renal origin: Secondary | ICD-10-CM | POA: Diagnosis not present

## 2017-09-30 DIAGNOSIS — D509 Iron deficiency anemia, unspecified: Secondary | ICD-10-CM | POA: Diagnosis not present

## 2017-09-30 DIAGNOSIS — E119 Type 2 diabetes mellitus without complications: Secondary | ICD-10-CM | POA: Diagnosis not present

## 2017-09-30 NOTE — Telephone Encounter (Signed)
Confirmed remote transmission w/ pt caregiver.

## 2017-10-02 ENCOUNTER — Encounter: Payer: Self-pay | Admitting: Cardiology

## 2017-10-02 DIAGNOSIS — N2581 Secondary hyperparathyroidism of renal origin: Secondary | ICD-10-CM | POA: Diagnosis not present

## 2017-10-02 DIAGNOSIS — N186 End stage renal disease: Secondary | ICD-10-CM | POA: Diagnosis not present

## 2017-10-02 DIAGNOSIS — D509 Iron deficiency anemia, unspecified: Secondary | ICD-10-CM | POA: Diagnosis not present

## 2017-10-02 DIAGNOSIS — E119 Type 2 diabetes mellitus without complications: Secondary | ICD-10-CM | POA: Diagnosis not present

## 2017-10-04 DIAGNOSIS — N186 End stage renal disease: Secondary | ICD-10-CM | POA: Diagnosis not present

## 2017-10-04 DIAGNOSIS — D509 Iron deficiency anemia, unspecified: Secondary | ICD-10-CM | POA: Diagnosis not present

## 2017-10-04 DIAGNOSIS — N2581 Secondary hyperparathyroidism of renal origin: Secondary | ICD-10-CM | POA: Diagnosis not present

## 2017-10-04 DIAGNOSIS — E119 Type 2 diabetes mellitus without complications: Secondary | ICD-10-CM | POA: Diagnosis not present

## 2017-10-07 DIAGNOSIS — D509 Iron deficiency anemia, unspecified: Secondary | ICD-10-CM | POA: Diagnosis not present

## 2017-10-07 DIAGNOSIS — E119 Type 2 diabetes mellitus without complications: Secondary | ICD-10-CM | POA: Diagnosis not present

## 2017-10-07 DIAGNOSIS — N2581 Secondary hyperparathyroidism of renal origin: Secondary | ICD-10-CM | POA: Diagnosis not present

## 2017-10-07 DIAGNOSIS — N186 End stage renal disease: Secondary | ICD-10-CM | POA: Diagnosis not present

## 2017-10-09 DIAGNOSIS — E119 Type 2 diabetes mellitus without complications: Secondary | ICD-10-CM | POA: Diagnosis not present

## 2017-10-09 DIAGNOSIS — D509 Iron deficiency anemia, unspecified: Secondary | ICD-10-CM | POA: Diagnosis not present

## 2017-10-09 DIAGNOSIS — N2581 Secondary hyperparathyroidism of renal origin: Secondary | ICD-10-CM | POA: Diagnosis not present

## 2017-10-09 DIAGNOSIS — N186 End stage renal disease: Secondary | ICD-10-CM | POA: Diagnosis not present

## 2017-10-11 DIAGNOSIS — N186 End stage renal disease: Secondary | ICD-10-CM | POA: Diagnosis not present

## 2017-10-11 DIAGNOSIS — D509 Iron deficiency anemia, unspecified: Secondary | ICD-10-CM | POA: Diagnosis not present

## 2017-10-11 DIAGNOSIS — N2581 Secondary hyperparathyroidism of renal origin: Secondary | ICD-10-CM | POA: Diagnosis not present

## 2017-10-11 DIAGNOSIS — E119 Type 2 diabetes mellitus without complications: Secondary | ICD-10-CM | POA: Diagnosis not present

## 2017-10-13 DIAGNOSIS — F250 Schizoaffective disorder, bipolar type: Secondary | ICD-10-CM | POA: Diagnosis not present

## 2017-10-14 DIAGNOSIS — E119 Type 2 diabetes mellitus without complications: Secondary | ICD-10-CM | POA: Diagnosis not present

## 2017-10-14 DIAGNOSIS — N2581 Secondary hyperparathyroidism of renal origin: Secondary | ICD-10-CM | POA: Diagnosis not present

## 2017-10-14 DIAGNOSIS — N186 End stage renal disease: Secondary | ICD-10-CM | POA: Diagnosis not present

## 2017-10-14 DIAGNOSIS — D509 Iron deficiency anemia, unspecified: Secondary | ICD-10-CM | POA: Diagnosis not present

## 2017-10-16 DIAGNOSIS — D509 Iron deficiency anemia, unspecified: Secondary | ICD-10-CM | POA: Diagnosis not present

## 2017-10-16 DIAGNOSIS — N186 End stage renal disease: Secondary | ICD-10-CM | POA: Diagnosis not present

## 2017-10-16 DIAGNOSIS — N2581 Secondary hyperparathyroidism of renal origin: Secondary | ICD-10-CM | POA: Diagnosis not present

## 2017-10-16 DIAGNOSIS — E119 Type 2 diabetes mellitus without complications: Secondary | ICD-10-CM | POA: Diagnosis not present

## 2017-10-18 DIAGNOSIS — E119 Type 2 diabetes mellitus without complications: Secondary | ICD-10-CM | POA: Diagnosis not present

## 2017-10-18 DIAGNOSIS — N186 End stage renal disease: Secondary | ICD-10-CM | POA: Diagnosis not present

## 2017-10-18 DIAGNOSIS — N2581 Secondary hyperparathyroidism of renal origin: Secondary | ICD-10-CM | POA: Diagnosis not present

## 2017-10-18 DIAGNOSIS — D509 Iron deficiency anemia, unspecified: Secondary | ICD-10-CM | POA: Diagnosis not present

## 2017-10-20 DIAGNOSIS — N186 End stage renal disease: Secondary | ICD-10-CM | POA: Diagnosis not present

## 2017-10-20 DIAGNOSIS — E119 Type 2 diabetes mellitus without complications: Secondary | ICD-10-CM | POA: Diagnosis not present

## 2017-10-20 DIAGNOSIS — D509 Iron deficiency anemia, unspecified: Secondary | ICD-10-CM | POA: Diagnosis not present

## 2017-10-20 DIAGNOSIS — N2581 Secondary hyperparathyroidism of renal origin: Secondary | ICD-10-CM | POA: Diagnosis not present

## 2017-10-23 DIAGNOSIS — N186 End stage renal disease: Secondary | ICD-10-CM | POA: Diagnosis not present

## 2017-10-23 DIAGNOSIS — N2581 Secondary hyperparathyroidism of renal origin: Secondary | ICD-10-CM | POA: Diagnosis not present

## 2017-10-23 DIAGNOSIS — D509 Iron deficiency anemia, unspecified: Secondary | ICD-10-CM | POA: Diagnosis not present

## 2017-10-23 DIAGNOSIS — E119 Type 2 diabetes mellitus without complications: Secondary | ICD-10-CM | POA: Diagnosis not present

## 2017-10-25 DIAGNOSIS — D509 Iron deficiency anemia, unspecified: Secondary | ICD-10-CM | POA: Diagnosis not present

## 2017-10-25 DIAGNOSIS — N2581 Secondary hyperparathyroidism of renal origin: Secondary | ICD-10-CM | POA: Diagnosis not present

## 2017-10-25 DIAGNOSIS — N186 End stage renal disease: Secondary | ICD-10-CM | POA: Diagnosis not present

## 2017-10-25 DIAGNOSIS — E119 Type 2 diabetes mellitus without complications: Secondary | ICD-10-CM | POA: Diagnosis not present

## 2017-10-27 DIAGNOSIS — D509 Iron deficiency anemia, unspecified: Secondary | ICD-10-CM | POA: Diagnosis not present

## 2017-10-27 DIAGNOSIS — E119 Type 2 diabetes mellitus without complications: Secondary | ICD-10-CM | POA: Diagnosis not present

## 2017-10-27 DIAGNOSIS — N186 End stage renal disease: Secondary | ICD-10-CM | POA: Diagnosis not present

## 2017-10-27 DIAGNOSIS — N2581 Secondary hyperparathyroidism of renal origin: Secondary | ICD-10-CM | POA: Diagnosis not present

## 2017-10-27 DIAGNOSIS — N032 Chronic nephritic syndrome with diffuse membranous glomerulonephritis: Secondary | ICD-10-CM | POA: Diagnosis not present

## 2017-10-27 DIAGNOSIS — Z992 Dependence on renal dialysis: Secondary | ICD-10-CM | POA: Diagnosis not present

## 2017-10-30 DIAGNOSIS — N186 End stage renal disease: Secondary | ICD-10-CM | POA: Diagnosis not present

## 2017-10-30 DIAGNOSIS — D509 Iron deficiency anemia, unspecified: Secondary | ICD-10-CM | POA: Diagnosis not present

## 2017-10-30 DIAGNOSIS — E119 Type 2 diabetes mellitus without complications: Secondary | ICD-10-CM | POA: Diagnosis not present

## 2017-10-30 DIAGNOSIS — N2581 Secondary hyperparathyroidism of renal origin: Secondary | ICD-10-CM | POA: Diagnosis not present

## 2017-10-31 ENCOUNTER — Ambulatory Visit (INDEPENDENT_AMBULATORY_CARE_PROVIDER_SITE_OTHER): Payer: Medicare Other | Admitting: *Deleted

## 2017-10-31 ENCOUNTER — Telehealth: Payer: Self-pay

## 2017-10-31 DIAGNOSIS — Z95810 Presence of automatic (implantable) cardiac defibrillator: Secondary | ICD-10-CM | POA: Diagnosis not present

## 2017-10-31 DIAGNOSIS — I4901 Ventricular fibrillation: Secondary | ICD-10-CM

## 2017-10-31 NOTE — Telephone Encounter (Signed)
Spoke with patients caregiver who regarding the need to set up a OV with GT to discus gen change. Will message to schedule for assistance in setting this up as he was unavailable for the upcoming open slots.

## 2017-11-01 DIAGNOSIS — D509 Iron deficiency anemia, unspecified: Secondary | ICD-10-CM | POA: Diagnosis not present

## 2017-11-01 DIAGNOSIS — N2581 Secondary hyperparathyroidism of renal origin: Secondary | ICD-10-CM | POA: Diagnosis not present

## 2017-11-01 DIAGNOSIS — E119 Type 2 diabetes mellitus without complications: Secondary | ICD-10-CM | POA: Diagnosis not present

## 2017-11-01 DIAGNOSIS — N186 End stage renal disease: Secondary | ICD-10-CM | POA: Diagnosis not present

## 2017-11-04 ENCOUNTER — Encounter: Payer: Medicare Other | Admitting: Internal Medicine

## 2017-11-04 DIAGNOSIS — N186 End stage renal disease: Secondary | ICD-10-CM | POA: Diagnosis not present

## 2017-11-04 DIAGNOSIS — E119 Type 2 diabetes mellitus without complications: Secondary | ICD-10-CM | POA: Diagnosis not present

## 2017-11-04 DIAGNOSIS — N2581 Secondary hyperparathyroidism of renal origin: Secondary | ICD-10-CM | POA: Diagnosis not present

## 2017-11-04 DIAGNOSIS — D509 Iron deficiency anemia, unspecified: Secondary | ICD-10-CM | POA: Diagnosis not present

## 2017-11-04 NOTE — Progress Notes (Signed)
Remote ICD transmission.   

## 2017-11-05 ENCOUNTER — Encounter: Payer: Self-pay | Admitting: Internal Medicine

## 2017-11-06 DIAGNOSIS — E119 Type 2 diabetes mellitus without complications: Secondary | ICD-10-CM | POA: Diagnosis not present

## 2017-11-06 DIAGNOSIS — D509 Iron deficiency anemia, unspecified: Secondary | ICD-10-CM | POA: Diagnosis not present

## 2017-11-06 DIAGNOSIS — E1129 Type 2 diabetes mellitus with other diabetic kidney complication: Secondary | ICD-10-CM | POA: Diagnosis not present

## 2017-11-06 DIAGNOSIS — N186 End stage renal disease: Secondary | ICD-10-CM | POA: Diagnosis not present

## 2017-11-06 DIAGNOSIS — N2581 Secondary hyperparathyroidism of renal origin: Secondary | ICD-10-CM | POA: Diagnosis not present

## 2017-11-08 DIAGNOSIS — N186 End stage renal disease: Secondary | ICD-10-CM | POA: Diagnosis not present

## 2017-11-08 DIAGNOSIS — E119 Type 2 diabetes mellitus without complications: Secondary | ICD-10-CM | POA: Diagnosis not present

## 2017-11-08 DIAGNOSIS — D509 Iron deficiency anemia, unspecified: Secondary | ICD-10-CM | POA: Diagnosis not present

## 2017-11-08 DIAGNOSIS — N2581 Secondary hyperparathyroidism of renal origin: Secondary | ICD-10-CM | POA: Diagnosis not present

## 2017-11-11 DIAGNOSIS — N186 End stage renal disease: Secondary | ICD-10-CM | POA: Diagnosis not present

## 2017-11-11 DIAGNOSIS — N2581 Secondary hyperparathyroidism of renal origin: Secondary | ICD-10-CM | POA: Diagnosis not present

## 2017-11-11 DIAGNOSIS — D509 Iron deficiency anemia, unspecified: Secondary | ICD-10-CM | POA: Diagnosis not present

## 2017-11-11 DIAGNOSIS — E119 Type 2 diabetes mellitus without complications: Secondary | ICD-10-CM | POA: Diagnosis not present

## 2017-11-11 LAB — CUP PACEART REMOTE DEVICE CHECK
Battery Voltage: 2.6 V
Brady Statistic RV Percent Paced: 0 %
Date Time Interrogation Session: 20190104133037
HighPow Impedance: 50 Ohm
HighPow Impedance: 64 Ohm
Implantable Lead Implant Date: 20100415
Implantable Lead Location: 753860
Implantable Lead Model: 6947
Implantable Pulse Generator Implant Date: 20100415
Lead Channel Impedance Value: 475 Ohm
Lead Channel Sensing Intrinsic Amplitude: 3.875 mV
Lead Channel Sensing Intrinsic Amplitude: 3.875 mV
Lead Channel Setting Pacing Amplitude: 2.5 V
Lead Channel Setting Pacing Pulse Width: 0.8 ms
Lead Channel Setting Sensing Sensitivity: 0.3 mV

## 2017-11-13 DIAGNOSIS — D509 Iron deficiency anemia, unspecified: Secondary | ICD-10-CM | POA: Diagnosis not present

## 2017-11-13 DIAGNOSIS — E119 Type 2 diabetes mellitus without complications: Secondary | ICD-10-CM | POA: Diagnosis not present

## 2017-11-13 DIAGNOSIS — N2581 Secondary hyperparathyroidism of renal origin: Secondary | ICD-10-CM | POA: Diagnosis not present

## 2017-11-13 DIAGNOSIS — N186 End stage renal disease: Secondary | ICD-10-CM | POA: Diagnosis not present

## 2017-11-15 DIAGNOSIS — E119 Type 2 diabetes mellitus without complications: Secondary | ICD-10-CM | POA: Diagnosis not present

## 2017-11-15 DIAGNOSIS — N2581 Secondary hyperparathyroidism of renal origin: Secondary | ICD-10-CM | POA: Diagnosis not present

## 2017-11-15 DIAGNOSIS — N186 End stage renal disease: Secondary | ICD-10-CM | POA: Diagnosis not present

## 2017-11-15 DIAGNOSIS — D509 Iron deficiency anemia, unspecified: Secondary | ICD-10-CM | POA: Diagnosis not present

## 2017-11-18 DIAGNOSIS — N186 End stage renal disease: Secondary | ICD-10-CM | POA: Diagnosis not present

## 2017-11-18 DIAGNOSIS — N2581 Secondary hyperparathyroidism of renal origin: Secondary | ICD-10-CM | POA: Diagnosis not present

## 2017-11-18 DIAGNOSIS — D509 Iron deficiency anemia, unspecified: Secondary | ICD-10-CM | POA: Diagnosis not present

## 2017-11-18 DIAGNOSIS — E119 Type 2 diabetes mellitus without complications: Secondary | ICD-10-CM | POA: Diagnosis not present

## 2017-11-20 DIAGNOSIS — N2581 Secondary hyperparathyroidism of renal origin: Secondary | ICD-10-CM | POA: Diagnosis not present

## 2017-11-20 DIAGNOSIS — D509 Iron deficiency anemia, unspecified: Secondary | ICD-10-CM | POA: Diagnosis not present

## 2017-11-20 DIAGNOSIS — E119 Type 2 diabetes mellitus without complications: Secondary | ICD-10-CM | POA: Diagnosis not present

## 2017-11-20 DIAGNOSIS — N186 End stage renal disease: Secondary | ICD-10-CM | POA: Diagnosis not present

## 2017-11-22 DIAGNOSIS — N186 End stage renal disease: Secondary | ICD-10-CM | POA: Diagnosis not present

## 2017-11-22 DIAGNOSIS — N2581 Secondary hyperparathyroidism of renal origin: Secondary | ICD-10-CM | POA: Diagnosis not present

## 2017-11-22 DIAGNOSIS — D509 Iron deficiency anemia, unspecified: Secondary | ICD-10-CM | POA: Diagnosis not present

## 2017-11-22 DIAGNOSIS — E119 Type 2 diabetes mellitus without complications: Secondary | ICD-10-CM | POA: Diagnosis not present

## 2017-11-24 NOTE — Progress Notes (Signed)
Cardiology Office Note Date:  11/28/2017  Patient ID:  James Nicholson 1966-01-01, MRN [id number] PCP:  [name], MD  Electrophysiologist:  Dr. Lovena Le    Chief Complaint: device at ERI  History of Present Illness: James Nicholson is a 52 y.o. male with history of resuscitated VF arrest w/ICD, HTN, HLD, CM of unknown etiology, chronic CHF (systolic), schizophrenia, ESRF on HD (Tues/Thurs/Sat).  He lives in a group home is accompanied by his caregiver as well as a [place] Ecologist Nurse, mental health w/[employer]).  He is feeling will.  No CP, palpitations or rest SOB.  He has some degree of DOE that has been chronic and unchanged for years, no symptoms of PND or orthopnea, no dizziness, near syncope or syncope, no shocks.  He had not heard the tone/alert.  Device information: MDT single chamber ICD, implanted 02/09/09, secondary prevention   Past Medical History:  Diagnosis Date  . Anemia   . Atrial fibrillation   . Cardiomyopathy ([place]) 2010   Unclear Etiology: Last Echo 06/2009: EF 40-45%, severe Lateral & apical Hypokinesis (? CAD) ; Grade 2 DDysfxn. Mild conc LVH.   Marland Kitchen Cellulitis   . Chronic kidney disease   . Dyslipidemia   . Enterobacter sepsis ([place])   . Hyperlipidemia   . Hypertension   . S/P ICD (internal cardiac defibrillator) procedure 2010   VT Arrest (in [place])  . Schizophrenia [facility])     Past Surgical History:  Procedure Laterality Date  . AV FISTULA PLACEMENT Right 02/10/2013   Procedure: ARTERIOVENOUS (AV) FISTULA CREATION;  Surgeon: Rosetta Posner, MD;  Location: [facility] OR;  Service: Vascular;  Laterality: Right;  Right forearm radial/cephalic arterovenous fistula.   Marland Kitchen CARDIAC CATHETERIZATION  2010   [place]: In setting of VT arrest. Per brother's report, nonobstructive with no intervention  . CARDIAC DEFIBRILLATOR PLACEMENT  2010   [place]  . FISTULOGRAM Right 08/09/2013   Procedure: FISTULOGRAM;  Surgeon: Angelia Mould, MD;   Location: [facility] CATH LAB;  Service: Cardiovascular;  Laterality: Right;  . LIGATION OF COMPETING BRANCHES OF ARTERIOVENOUS FISTULA Right 08/13/2013   Procedure: LIGATION OF COMPETING BRANCHES X5 OF ARTERIOVENOUS FISTULA- RIGHT ARM;  Surgeon: Serafina Mitchell, MD;  Location: [facility] OR;  Service: Vascular;  Laterality: Right;    Current Outpatient Medications  Medication Sig Dispense Refill  . calcium acetate (PHOSLO) 667 MG capsule Take 1,334 mg by mouth 3 (three) times daily with meals. 8a, 12p, 5p    . gabapentin (NEURONTIN) 300 MG capsule Take 300 mg by mouth 2 (two) times daily.    Marland Kitchen gemfibrozil (LOPID) 600 MG tablet Take 600 mg by mouth 2 (two) times daily before a meal.    . insulin detemir (LEVEMIR) 100 UNIT/ML injection Inject 0.05 mLs (5 Units total) into the skin at bedtime. 10 mL 0  . metoprolol succinate (TOPROL-XL) 50 MG 24 hr tablet Take 50 mg by mouth daily. Take with or immediately following a meal.     . omeprazole (PRILOSEC) 20 MG capsule Take 20 mg by mouth every morning.     . risperiDONE (RISPERDAL) 0.25 MG tablet Take 0.25 mg by mouth at bedtime.    . traZODone (DESYREL) 100 MG tablet Take 100 mg by mouth at bedtime.     No current facility-administered medications for this visit.     Allergies:   Patient has no known allergies.   Social History:  The patient  reports that he quit smoking about 9 years ago. he has never  used smokeless tobacco. He reports that he does not drink alcohol or use drugs.   Family History:  The patient's family history includes CAD in his mother.  ROS:  Please see the history of present illness.    All other systems are reviewed and otherwise negative.   PHYSICAL EXAM:  VS:  BP 120/82   Pulse 88   Ht 5\' 8"  (1.727 m)   Wt 180 lb (81.6 kg)   BMI 27.37 kg/m  BMI: Body mass index is 27.37 kg/m. Well nourished, well developed, in no acute distress  HEENT: normocephalic, atraumatic  Neck: no JVD, carotid bruits or masses Cardiac:  RRR; no  significant murmurs, no rubs, or gallops Lungs:  CTA b/l, no wheezing, rhonchi or rales  Abd: soft, nontender MS: no deformity or atrophy Ext: no LE edema, AVF RUE/forearm Skin: warm and dry, no rash Neuro:  No gross deficits appreciated Psych: euthymic mood, full affect  ICD site is stable, no tethering or discomfort   EKG:  Done today and reviewed by myself SR, RBBB, LAD ICD interrogation done today and reviewed by myself: battery reached RRT 09/18/17, lead measurements are good, no arrhythmias/observations other wise, OptiVol looks good  12/23/13: TTE Study Conclusions - Left ventricle: The cavity size was normal. Wall thickness was normal. Systolic function was mildly reduced. The estimated ejection fraction was in the range of 45% to 50%. Diffuse hypokinesis. Doppler parameters are consistent with abnormal left ventricular relaxation (grade 1 diastolic dysfunction). - Mitral valve: Mild to moderate regurgitation.  Recent Labs: No results found for requested labs within last 8760 hours.  No results found for requested labs within last 8760 hours.   CrCl cannot be calculated (Patient's most recent lab result is older than the maximum 21 days allowed.).   Wt Readings from Last 3 Encounters:  11/28/17 180 lb (81.6 kg)  05/12/17 183 lb (83 kg)  07/07/16 185 lb (83.9 kg)     Other studies reviewed: Additional studies/records reviewed today include: summarized above  ASSESSMENT AND PLAN:  1. ICD     At RRT     generator change procedure, risks/benefits discussed, the patient is agreeable to proceed     Scheduled for 12/05/17, audible alerts programmed off  2. CM, chronic CHF     No symptoms or exam findings to suggest fluid OL     OptiVol looks good     Fluid management with dialysis  3. HTN     Looks OK,, no changes  4. ? Hx of AFib     CHA2DS2Vasc is 2     Unknown when this was noted, not on a/c     Single lead device, no HVR or observations to suggest  AF     New device will have ILR platform and will be able to monitor for this    Disposition: F/u with gen change and routine post-procedure follow up   Current medicines are reviewed at length with the patient today.  The patient did not have any concerns regarding medicines.  Venetia Night, PA-C 11/28/2017 10:20 AM     [facility] [address] [place] [address] [phone] (office)  [phone] (fax)

## 2017-11-25 DIAGNOSIS — N2581 Secondary hyperparathyroidism of renal origin: Secondary | ICD-10-CM | POA: Diagnosis not present

## 2017-11-25 DIAGNOSIS — E119 Type 2 diabetes mellitus without complications: Secondary | ICD-10-CM | POA: Diagnosis not present

## 2017-11-25 DIAGNOSIS — D509 Iron deficiency anemia, unspecified: Secondary | ICD-10-CM | POA: Diagnosis not present

## 2017-11-25 DIAGNOSIS — N186 End stage renal disease: Secondary | ICD-10-CM | POA: Diagnosis not present

## 2017-11-27 DIAGNOSIS — N2581 Secondary hyperparathyroidism of renal origin: Secondary | ICD-10-CM | POA: Diagnosis not present

## 2017-11-27 DIAGNOSIS — Z992 Dependence on renal dialysis: Secondary | ICD-10-CM | POA: Diagnosis not present

## 2017-11-27 DIAGNOSIS — D509 Iron deficiency anemia, unspecified: Secondary | ICD-10-CM | POA: Diagnosis not present

## 2017-11-27 DIAGNOSIS — N032 Chronic nephritic syndrome with diffuse membranous glomerulonephritis: Secondary | ICD-10-CM | POA: Diagnosis not present

## 2017-11-27 DIAGNOSIS — E119 Type 2 diabetes mellitus without complications: Secondary | ICD-10-CM | POA: Diagnosis not present

## 2017-11-27 DIAGNOSIS — N186 End stage renal disease: Secondary | ICD-10-CM | POA: Diagnosis not present

## 2017-11-28 ENCOUNTER — Encounter: Payer: Self-pay | Admitting: Physician Assistant

## 2017-11-28 ENCOUNTER — Encounter: Payer: Self-pay | Admitting: *Deleted

## 2017-11-28 ENCOUNTER — Ambulatory Visit (INDEPENDENT_AMBULATORY_CARE_PROVIDER_SITE_OTHER): Payer: Medicare Other | Admitting: Physician Assistant

## 2017-11-28 VITALS — BP 120/82 | HR 88 | Ht 68.0 in | Wt 180.0 lb

## 2017-11-28 DIAGNOSIS — Z4502 Encounter for adjustment and management of automatic implantable cardiac defibrillator: Secondary | ICD-10-CM | POA: Diagnosis not present

## 2017-11-28 DIAGNOSIS — Z95810 Presence of automatic (implantable) cardiac defibrillator: Secondary | ICD-10-CM

## 2017-11-28 DIAGNOSIS — N186 End stage renal disease: Secondary | ICD-10-CM | POA: Diagnosis not present

## 2017-11-28 DIAGNOSIS — I5022 Chronic systolic (congestive) heart failure: Secondary | ICD-10-CM

## 2017-11-28 DIAGNOSIS — N032 Chronic nephritic syndrome with diffuse membranous glomerulonephritis: Secondary | ICD-10-CM | POA: Diagnosis not present

## 2017-11-28 DIAGNOSIS — I10 Essential (primary) hypertension: Secondary | ICD-10-CM

## 2017-11-28 DIAGNOSIS — N189 Chronic kidney disease, unspecified: Secondary | ICD-10-CM | POA: Insufficient documentation

## 2017-11-28 DIAGNOSIS — I4901 Ventricular fibrillation: Secondary | ICD-10-CM

## 2017-11-28 DIAGNOSIS — Z992 Dependence on renal dialysis: Secondary | ICD-10-CM | POA: Diagnosis not present

## 2017-11-28 NOTE — Patient Instructions (Addendum)
Medication Instructions:   Your physician recommends that you continue on your current medications as directed. Please refer to the Current Medication list given to you today.   If you need a refill on your cardiac medications before your next appointment, please call your pharmacy.  Labwork:  BMET CBC AND  PT/PTT  TODAY    Testing/Procedures:  SEE LETTER  FOR GEN CHANGE  On 12-05-17    Follow-Up:  AFTER  12-05-17   10 DAY  WOUND CHECK WITH DEVICE CLINIC    22 Arp WITH DR Lovena Le    Any Other Special Instructions Will Be Listed Below (If Applicable).

## 2017-11-29 DIAGNOSIS — N186 End stage renal disease: Secondary | ICD-10-CM | POA: Diagnosis not present

## 2017-11-29 DIAGNOSIS — N2581 Secondary hyperparathyroidism of renal origin: Secondary | ICD-10-CM | POA: Diagnosis not present

## 2017-11-29 DIAGNOSIS — E119 Type 2 diabetes mellitus without complications: Secondary | ICD-10-CM | POA: Diagnosis not present

## 2017-11-29 DIAGNOSIS — D509 Iron deficiency anemia, unspecified: Secondary | ICD-10-CM | POA: Diagnosis not present

## 2017-11-29 LAB — BASIC METABOLIC PANEL
BUN/Creatinine Ratio: 4 — ABNORMAL LOW (ref 9–20)
BUN: 26 mg/dL — ABNORMAL HIGH (ref 6–24)
CO2: 23 mmol/L (ref 20–29)
Calcium: 9.7 mg/dL (ref 8.7–10.2)
Chloride: 94 mmol/L — ABNORMAL LOW (ref 96–106)
Creatinine, Ser: 5.8 mg/dL — ABNORMAL HIGH (ref 0.76–1.27)
GFR calc Af Amer: 12 mL/min/{1.73_m2} — ABNORMAL LOW (ref >59)
GFR calc non Af Amer: 10 mL/min/{1.73_m2} — ABNORMAL LOW (ref >59)
Glucose: 102 mg/dL — ABNORMAL HIGH (ref 65–99)
Potassium: 4.7 mmol/L (ref 3.5–5.2)
Sodium: 138 mmol/L (ref 134–144)

## 2017-11-29 LAB — PT AND PTT
INR: 1 (ref 0.8–1.2)
Prothrombin Time: 10.8 s (ref 9.1–12.0)
aPTT: 27 s (ref 24–33)

## 2017-11-29 LAB — CBC
Hematocrit: 38.4 % (ref 37.5–51.0)
Hemoglobin: 13.4 g/dL (ref 13.0–17.7)
MCH: 33.3 pg — ABNORMAL HIGH (ref 26.6–33.0)
MCHC: 34.9 g/dL (ref 31.5–35.7)
MCV: 96 fL (ref 79–97)
Platelets: 248 10*3/uL (ref 150–379)
RBC: 4.02 x10E6/uL — ABNORMAL LOW (ref 4.14–5.80)
RDW: 13.5 % (ref 12.3–15.4)
WBC: 8.2 10*3/uL (ref 3.4–10.8)

## 2017-12-02 ENCOUNTER — Telehealth: Payer: Self-pay

## 2017-12-02 DIAGNOSIS — D509 Iron deficiency anemia, unspecified: Secondary | ICD-10-CM | POA: Diagnosis not present

## 2017-12-02 DIAGNOSIS — N186 End stage renal disease: Secondary | ICD-10-CM | POA: Diagnosis not present

## 2017-12-02 DIAGNOSIS — N2581 Secondary hyperparathyroidism of renal origin: Secondary | ICD-10-CM | POA: Diagnosis not present

## 2017-12-02 DIAGNOSIS — E119 Type 2 diabetes mellitus without complications: Secondary | ICD-10-CM | POA: Diagnosis not present

## 2017-12-02 NOTE — Telephone Encounter (Signed)
Spoke with James Nicholson-notified of stable labs for gen change.  No questions.

## 2017-12-04 DIAGNOSIS — N186 End stage renal disease: Secondary | ICD-10-CM | POA: Diagnosis not present

## 2017-12-04 DIAGNOSIS — N2581 Secondary hyperparathyroidism of renal origin: Secondary | ICD-10-CM | POA: Diagnosis not present

## 2017-12-04 DIAGNOSIS — D509 Iron deficiency anemia, unspecified: Secondary | ICD-10-CM | POA: Diagnosis not present

## 2017-12-04 DIAGNOSIS — E119 Type 2 diabetes mellitus without complications: Secondary | ICD-10-CM | POA: Diagnosis not present

## 2017-12-05 ENCOUNTER — Ambulatory Visit (HOSPITAL_COMMUNITY)
Admission: RE | Admit: 2017-12-05 | Discharge: 2017-12-05 | Disposition: A | Discharge status: Home / Self Care | Payer: Medicare Other | Source: Ambulatory Visit | Attending: Internal Medicine | Admitting: Internal Medicine

## 2017-12-05 ENCOUNTER — Ambulatory Visit (HOSPITAL_COMMUNITY)
Admission: RE | Disposition: A | Discharge status: Home / Self Care | Payer: Self-pay | Source: Ambulatory Visit | Attending: Internal Medicine

## 2017-12-05 ENCOUNTER — Encounter (HOSPITAL_COMMUNITY): Payer: Self-pay

## 2017-12-05 ENCOUNTER — Encounter: Payer: Self-pay | Admitting: Internal Medicine

## 2017-12-05 DIAGNOSIS — Z8249 Family history of ischemic heart disease and other diseases of the circulatory system: Secondary | ICD-10-CM | POA: Diagnosis not present

## 2017-12-05 DIAGNOSIS — E785 Hyperlipidemia, unspecified: Secondary | ICD-10-CM | POA: Insufficient documentation

## 2017-12-05 DIAGNOSIS — I4901 Ventricular fibrillation: Secondary | ICD-10-CM | POA: Diagnosis not present

## 2017-12-05 DIAGNOSIS — Z87891 Personal history of nicotine dependence: Secondary | ICD-10-CM | POA: Diagnosis not present

## 2017-12-05 DIAGNOSIS — N186 End stage renal disease: Secondary | ICD-10-CM | POA: Insufficient documentation

## 2017-12-05 DIAGNOSIS — I429 Cardiomyopathy, unspecified: Secondary | ICD-10-CM | POA: Insufficient documentation

## 2017-12-05 DIAGNOSIS — Z79899 Other long term (current) drug therapy: Secondary | ICD-10-CM | POA: Diagnosis not present

## 2017-12-05 DIAGNOSIS — F209 Schizophrenia, unspecified: Secondary | ICD-10-CM | POA: Diagnosis not present

## 2017-12-05 DIAGNOSIS — Z95810 Presence of automatic (implantable) cardiac defibrillator: Secondary | ICD-10-CM | POA: Diagnosis present

## 2017-12-05 DIAGNOSIS — Z4502 Encounter for adjustment and management of automatic implantable cardiac defibrillator: Secondary | ICD-10-CM | POA: Diagnosis not present

## 2017-12-05 DIAGNOSIS — I5022 Chronic systolic (congestive) heart failure: Secondary | ICD-10-CM | POA: Insufficient documentation

## 2017-12-05 DIAGNOSIS — Z8674 Personal history of sudden cardiac arrest: Secondary | ICD-10-CM | POA: Insufficient documentation

## 2017-12-05 DIAGNOSIS — Z992 Dependence on renal dialysis: Secondary | ICD-10-CM | POA: Insufficient documentation

## 2017-12-05 DIAGNOSIS — I2510 Atherosclerotic heart disease of native coronary artery without angina pectoris: Secondary | ICD-10-CM | POA: Diagnosis not present

## 2017-12-05 DIAGNOSIS — Z794 Long term (current) use of insulin: Secondary | ICD-10-CM | POA: Diagnosis not present

## 2017-12-05 DIAGNOSIS — I132 Hypertensive heart and chronic kidney disease with heart failure and with stage 5 chronic kidney disease, or end stage renal disease: Secondary | ICD-10-CM | POA: Insufficient documentation

## 2017-12-05 DIAGNOSIS — Z9889 Other specified postprocedural states: Secondary | ICD-10-CM | POA: Insufficient documentation

## 2017-12-05 DIAGNOSIS — Z8619 Personal history of other infectious and parasitic diseases: Secondary | ICD-10-CM | POA: Insufficient documentation

## 2017-12-05 HISTORY — PX: ICD GENERATOR CHANGEOUT: EP1231

## 2017-12-05 LAB — SURGICAL PCR SCREEN
MRSA, PCR: NEGATIVE
Staphylococcus aureus: NEGATIVE

## 2017-12-05 LAB — GLUCOSE, CAPILLARY: Glucose-Capillary: 81 mg/dL (ref 65–99)

## 2017-12-05 SURGERY — ICD GENERATOR CHANGEOUT

## 2017-12-05 MED ORDER — SODIUM CHLORIDE 0.9 % IV SOLN
INTRAVENOUS | Status: DC
  Administered 2017-12-05: 10:00:00 via INTRAVENOUS

## 2017-12-05 MED ORDER — MUPIROCIN 2 % EX OINT
TOPICAL_OINTMENT | CUTANEOUS | Status: AC
  Filled 2017-12-05: qty 22

## 2017-12-05 MED ORDER — ONDANSETRON HCL 4 MG/2ML IJ SOLN: 4.0000 mg | Freq: Four times a day (QID) | INTRAMUSCULAR | Status: DC | PRN

## 2017-12-05 MED ORDER — FENTANYL CITRATE (PF) 100 MCG/2ML IJ SOLN
INTRAMUSCULAR | Status: DC | PRN
  Administered 2017-12-05: 25 ug via INTRAVENOUS
  Administered 2017-12-05 (×3): 12.5 ug via INTRAVENOUS

## 2017-12-05 MED ORDER — CHLORHEXIDINE GLUCONATE 4 % EX LIQD: 60.0000 mL | Freq: Once | CUTANEOUS | Status: DC

## 2017-12-05 MED ORDER — CEFAZOLIN SODIUM-DEXTROSE 2-4 GM/100ML-% IV SOLN
INTRAVENOUS | Status: AC
  Filled 2017-12-05: qty 100

## 2017-12-05 MED ORDER — FENTANYL CITRATE (PF) 100 MCG/2ML IJ SOLN
INTRAMUSCULAR | Status: AC
  Filled 2017-12-05: qty 2

## 2017-12-05 MED ORDER — LIDOCAINE HCL (PF) 1 % IJ SOLN
INTRAMUSCULAR | Status: AC
  Filled 2017-12-05: qty 60

## 2017-12-05 MED ORDER — CEFAZOLIN SODIUM-DEXTROSE 2-4 GM/100ML-% IV SOLN
2.0000 g | INTRAVENOUS | Status: AC
  Administered 2017-12-05: 2 g via INTRAVENOUS
  Filled 2017-12-05: qty 100

## 2017-12-05 MED ORDER — LIDOCAINE HCL (PF) 1 % IJ SOLN
INTRAMUSCULAR | Status: DC | PRN
  Administered 2017-12-05: 60 mL

## 2017-12-05 MED ORDER — ACETAMINOPHEN 325 MG PO TABS: 325.0000 mg | ORAL_TABLET | ORAL | Status: DC | PRN

## 2017-12-05 MED ORDER — SODIUM CHLORIDE 0.9 % IR SOLN
80.0000 mg | Status: DC
  Filled 2017-12-05: qty 2

## 2017-12-05 MED ORDER — SODIUM CHLORIDE 0.9 % IR SOLN
Status: AC
  Filled 2017-12-05: qty 2

## 2017-12-05 MED ORDER — MIDAZOLAM HCL 5 MG/5ML IJ SOLN
INTRAMUSCULAR | Status: DC | PRN
  Administered 2017-12-05 (×5): 1 mg via INTRAVENOUS

## 2017-12-05 MED ORDER — MIDAZOLAM HCL 5 MG/5ML IJ SOLN
INTRAMUSCULAR | Status: AC
  Filled 2017-12-05: qty 5

## 2017-12-05 SURGICAL SUPPLY — 4 items
CABLE SURGICAL S-101-97-12 (CABLE) ×2 IMPLANT
ICD VISIA MRI DVFB1D1 (ICD Generator) ×2 IMPLANT
PAD DEFIB LIFELINK (PAD) ×2 IMPLANT
TRAY PACEMAKER INSERTION (PACKS) ×2 IMPLANT

## 2017-12-05 NOTE — Progress Notes (Signed)
Outer dressing removed and instructions to keep area dry given.

## 2017-12-05 NOTE — Discharge Instructions (Signed)

## 2017-12-05 NOTE — H&P (Signed)
Cardiology Office Note Date:  11/28/2017  Patient ID:  James Nicholson, James Nicholson [birth year], MRN [id number] PCP:  [name], MD         Electrophysiologist:  Dr. Lovena Le    Chief Complaint: device at ERI  History of Present Illness: James Nicholson is a 52 y.o. male with history of resuscitated VF arrest w/ICD, HTN, HLD, CM of unknown etiology, chronic CHF (systolic), schizophrenia, ESRF on HD (Tues/Thurs/Sat).  He lives in a group home is accompanied by his caregiver as well as a [place] Ecologist Nurse, mental health w/[employer]).  He is feeling will.  No CP, palpitations or rest SOB.  He has some degree of DOE that has been chronic and unchanged for years, no symptoms of PND or orthopnea, no dizziness, near syncope or syncope, no shocks.  He had not heard the tone/alert.  Device information: MDT single chamber ICD, implanted 02/09/09, secondary prevention       Past Medical History:  Diagnosis Date  . Anemia   . Atrial fibrillation   . Cardiomyopathy ([place]) 2010   Unclear Etiology: Last Echo 06/2009: EF 40-45%, severe Lateral & apical Hypokinesis (? CAD) ; Grade 2 DDysfxn. Mild conc LVH.   Marland Kitchen Cellulitis   . Chronic kidney disease   . Dyslipidemia   . Enterobacter sepsis ([place])   . Hyperlipidemia   . Hypertension   . S/P ICD (internal cardiac defibrillator) procedure 2010   VT Arrest (in [place])  . Schizophrenia [facility])          Past Surgical History:  Procedure Laterality Date  . AV FISTULA PLACEMENT Right 02/10/2013   Procedure: ARTERIOVENOUS (AV) FISTULA CREATION;  Surgeon: Rosetta Posner, MD;  Location: [facility] OR;  Service: Vascular;  Laterality: Right;  Right forearm radial/cephalic arterovenous fistula.   Marland Kitchen CARDIAC CATHETERIZATION  2010   [place]: In setting of VT arrest. Per brother's report, nonobstructive with no intervention  . CARDIAC DEFIBRILLATOR PLACEMENT  2010   [place]  . FISTULOGRAM Right 08/09/2013   Procedure: FISTULOGRAM;   Surgeon: Angelia Mould, MD;  Location: [facility] CATH LAB;  Service: Cardiovascular;  Laterality: Right;  . LIGATION OF COMPETING BRANCHES OF ARTERIOVENOUS FISTULA Right 08/13/2013   Procedure: LIGATION OF COMPETING BRANCHES X5 OF ARTERIOVENOUS FISTULA- RIGHT ARM;  Surgeon: Serafina Mitchell, MD;  Location: [facility] OR;  Service: Vascular;  Laterality: Right;          Current Outpatient Medications  Medication Sig Dispense Refill  . calcium acetate (PHOSLO) 667 MG capsule Take 1,334 mg by mouth 3 (three) times daily with meals. 8a, 12p, 5p    . gabapentin (NEURONTIN) 300 MG capsule Take 300 mg by mouth 2 (two) times daily.    Marland Kitchen gemfibrozil (LOPID) 600 MG tablet Take 600 mg by mouth 2 (two) times daily before a meal.    . insulin detemir (LEVEMIR) 100 UNIT/ML injection Inject 0.05 mLs (5 Units total) into the skin at bedtime. 10 mL 0  . metoprolol succinate (TOPROL-XL) 50 MG 24 hr tablet Take 50 mg by mouth daily. Take with or immediately following a meal.     . omeprazole (PRILOSEC) 20 MG capsule Take 20 mg by mouth every morning.     . risperiDONE (RISPERDAL) 0.25 MG tablet Take 0.25 mg by mouth at bedtime.    . traZODone (DESYREL) 100 MG tablet Take 100 mg by mouth at bedtime.     No current facility-administered medications for this visit.     Allergies:   Patient has no  known allergies.   Social History:  The patient  reports that he quit smoking about 9 years ago. he has never used smokeless tobacco. He reports that he does not drink alcohol or use drugs.   Family History:  The patient's family history includes CAD in his mother.  ROS:  Please see the history of present illness.    All other systems are reviewed and otherwise negative.   PHYSICAL EXAM:  VS:  BP 120/82   Pulse 88   Ht 5\' 8"  (1.727 m)   Wt 180 lb (81.6 kg)   BMI 27.37 kg/m  BMI: Body mass index is 27.37 kg/m. Well nourished, well developed, in no acute distress  HEENT: normocephalic,  atraumatic  Neck: no JVD, carotid bruits or masses Cardiac:  RRR; no significant murmurs, no rubs, or gallops Lungs:  CTA b/l, no wheezing, rhonchi or rales  Abd: soft, nontender MS: no deformity or atrophy Ext: no LE edema, AVF RUE/forearm Skin: warm and dry, no rash Neuro:  No gross deficits appreciated Psych: euthymic mood, full affect  ICD site is stable, no tethering or discomfort   EKG:  Done today and reviewed by myself SR, RBBB, LAD ICD interrogation done today and reviewed by myself: battery reached RRT 09/18/17, lead measurements are good, no arrhythmias/observations other wise, OptiVol looks good  12/23/13: TTE Study Conclusions - Left ventricle: The cavity size was normal. Wall thickness was normal. Systolic function was mildly reduced. The estimated ejection fraction was in the range of 45% to 50%. Diffuse hypokinesis. Doppler parameters are consistent with abnormal left ventricular relaxation (grade 1 diastolic dysfunction). - Mitral valve: Mild to moderate regurgitation.  Recent Labs: No results found for requested labs within last 8760 hours.  No results found for requested labs within last 8760 hours.   CrCl cannot be calculated (Patient's most recent lab result is older than the maximum 21 days allowed.).      Wt Readings from Last 3 Encounters:  11/28/17 180 lb (81.6 kg)  05/12/17 183 lb (83 kg)  07/07/16 185 lb (83.9 kg)     Other studies reviewed: Additional studies/records reviewed today include: summarized above  ASSESSMENT AND PLAN:  1. ICD     At RRT     generator change procedure, risks/benefits discussed, the patient is agreeable to proceed     Scheduled for 12/05/17, audible alerts programmed off  2. CM, chronic CHF     No symptoms or exam findings to suggest fluid OL     OptiVol looks good     Fluid management with dialysis  3. HTN     Looks OK,, no changes  4. ? Hx of AFib     CHA2DS2Vasc is 2     Unknown  when this was noted, not on a/c     Single lead device, no HVR or observations to suggest AF     New device will have ILR platform and will be able to monitor for this    Disposition: F/u with gen change and routine post-procedure follow up   Current medicines are reviewed at length with the patient today.  The patient did not have any concerns regarding medicines.  Venetia Night, PA-C 11/28/2017 10:20 AM     EP Attending  Patient seen and examined. Agree with above. No change since prior clinic visit. He has reached ERI on his current device. He has a prior VF arrest. Will plan to proceed with ICD generator change out for secondary prevention.  Mikle Bosworth.D.

## 2017-12-05 NOTE — H&P (Signed)
ICD Criteria  Current LVEF:45%. Within 12 months prior to implant: No   Heart failure history: No  Cardiomyopathy history: Yes, Non-Ischemic Cardiomyopathy.  Atrial Fibrillation/Atrial Flutter: No.  Ventricular tachycardia history: No.  Cardiac arrest history: Yes, Ventricular Fibrillation.  History of syndromes with risk of sudden death: No.  Previous ICD: Yes, Reason for ICD:  Secondary prevention.  Current ICD indication: Secondary  PPM indication: No.   Class I or II Bradycardia indication present: No  Beta Blocker therapy for 3 or more months: Yes, prescribed.   Ace Inhibitor/ARB therapy for 3 or more months: No, medical reason.

## 2017-12-06 DIAGNOSIS — N2581 Secondary hyperparathyroidism of renal origin: Secondary | ICD-10-CM | POA: Diagnosis not present

## 2017-12-06 DIAGNOSIS — D509 Iron deficiency anemia, unspecified: Secondary | ICD-10-CM | POA: Diagnosis not present

## 2017-12-06 DIAGNOSIS — N186 End stage renal disease: Secondary | ICD-10-CM | POA: Diagnosis not present

## 2017-12-06 DIAGNOSIS — E119 Type 2 diabetes mellitus without complications: Secondary | ICD-10-CM | POA: Diagnosis not present

## 2017-12-08 ENCOUNTER — Encounter (HOSPITAL_COMMUNITY): Payer: Self-pay | Admitting: Internal Medicine

## 2017-12-08 MED FILL — Gentamicin Sulfate Inj 40 MG/ML: INTRAMUSCULAR | Qty: 80 | Status: AC

## 2017-12-09 DIAGNOSIS — E119 Type 2 diabetes mellitus without complications: Secondary | ICD-10-CM | POA: Diagnosis not present

## 2017-12-09 DIAGNOSIS — N186 End stage renal disease: Secondary | ICD-10-CM | POA: Diagnosis not present

## 2017-12-09 DIAGNOSIS — D509 Iron deficiency anemia, unspecified: Secondary | ICD-10-CM | POA: Diagnosis not present

## 2017-12-09 DIAGNOSIS — N2581 Secondary hyperparathyroidism of renal origin: Secondary | ICD-10-CM | POA: Diagnosis not present

## 2017-12-11 DIAGNOSIS — N2581 Secondary hyperparathyroidism of renal origin: Secondary | ICD-10-CM | POA: Diagnosis not present

## 2017-12-11 DIAGNOSIS — N186 End stage renal disease: Secondary | ICD-10-CM | POA: Diagnosis not present

## 2017-12-11 DIAGNOSIS — D509 Iron deficiency anemia, unspecified: Secondary | ICD-10-CM | POA: Diagnosis not present

## 2017-12-11 DIAGNOSIS — E119 Type 2 diabetes mellitus without complications: Secondary | ICD-10-CM | POA: Diagnosis not present

## 2017-12-13 DIAGNOSIS — E119 Type 2 diabetes mellitus without complications: Secondary | ICD-10-CM | POA: Diagnosis not present

## 2017-12-13 DIAGNOSIS — D509 Iron deficiency anemia, unspecified: Secondary | ICD-10-CM | POA: Diagnosis not present

## 2017-12-13 DIAGNOSIS — N186 End stage renal disease: Secondary | ICD-10-CM | POA: Diagnosis not present

## 2017-12-13 DIAGNOSIS — N2581 Secondary hyperparathyroidism of renal origin: Secondary | ICD-10-CM | POA: Diagnosis not present

## 2017-12-15 ENCOUNTER — Ambulatory Visit: Payer: Medicare Other

## 2017-12-16 DIAGNOSIS — N186 End stage renal disease: Secondary | ICD-10-CM | POA: Diagnosis not present

## 2017-12-16 DIAGNOSIS — E119 Type 2 diabetes mellitus without complications: Secondary | ICD-10-CM | POA: Diagnosis not present

## 2017-12-16 DIAGNOSIS — D509 Iron deficiency anemia, unspecified: Secondary | ICD-10-CM | POA: Diagnosis not present

## 2017-12-16 DIAGNOSIS — N2581 Secondary hyperparathyroidism of renal origin: Secondary | ICD-10-CM | POA: Diagnosis not present

## 2017-12-17 ENCOUNTER — Ambulatory Visit (INDEPENDENT_AMBULATORY_CARE_PROVIDER_SITE_OTHER): Payer: Medicare Other | Admitting: *Deleted

## 2017-12-17 DIAGNOSIS — I4901 Ventricular fibrillation: Secondary | ICD-10-CM

## 2017-12-17 LAB — CUP PACEART INCLINIC DEVICE CHECK
Battery Remaining Longevity: 133 mo
Battery Voltage: 3.03 V
Brady Statistic RV Percent Paced: 0.01 %
Date Time Interrogation Session: 20190220144023
HighPow Impedance: 50 Ohm
HighPow Impedance: 76 Ohm
Implantable Lead Implant Date: 20100415
Implantable Lead Location: 753860
Implantable Lead Model: 6947
Implantable Pulse Generator Implant Date: 20190208
Lead Channel Impedance Value: 399 Ohm
Lead Channel Impedance Value: 418 Ohm
Lead Channel Pacing Threshold Amplitude: 2.5 V
Lead Channel Pacing Threshold Pulse Width: 0.4 ms
Lead Channel Sensing Intrinsic Amplitude: 3.625 mV
Lead Channel Sensing Intrinsic Amplitude: 4 mV
Lead Channel Setting Pacing Amplitude: 2.5 V
Lead Channel Setting Pacing Pulse Width: 0.8 ms
Lead Channel Setting Sensing Sensitivity: 0.3 mV

## 2017-12-17 NOTE — Progress Notes (Signed)
Wound check appointment. Steri-strips removed by patient at home. Wound without redness or edema. Incision edges approximated with the exception of a stitch noted at the left medial incision. Stitch clipped and steri-strips applied. Normal device function. Threshold, sensing, and impedance consistent with implant measurements. Device programmed at chronic values s/p gen change. Histogram distribution appropriate for patient and level of activity. No ventricular arrhythmias noted. Patient educated about wound care and shock plan. ROV with DC for wound re-check 3/1 and ROV with GT 5/3

## 2017-12-18 DIAGNOSIS — N186 End stage renal disease: Secondary | ICD-10-CM | POA: Diagnosis not present

## 2017-12-18 DIAGNOSIS — N2581 Secondary hyperparathyroidism of renal origin: Secondary | ICD-10-CM | POA: Diagnosis not present

## 2017-12-18 DIAGNOSIS — E119 Type 2 diabetes mellitus without complications: Secondary | ICD-10-CM | POA: Diagnosis not present

## 2017-12-18 DIAGNOSIS — D509 Iron deficiency anemia, unspecified: Secondary | ICD-10-CM | POA: Diagnosis not present

## 2017-12-20 DIAGNOSIS — N2581 Secondary hyperparathyroidism of renal origin: Secondary | ICD-10-CM | POA: Diagnosis not present

## 2017-12-20 DIAGNOSIS — N186 End stage renal disease: Secondary | ICD-10-CM | POA: Diagnosis not present

## 2017-12-20 DIAGNOSIS — E119 Type 2 diabetes mellitus without complications: Secondary | ICD-10-CM | POA: Diagnosis not present

## 2017-12-20 DIAGNOSIS — D509 Iron deficiency anemia, unspecified: Secondary | ICD-10-CM | POA: Diagnosis not present

## 2017-12-23 DIAGNOSIS — N2581 Secondary hyperparathyroidism of renal origin: Secondary | ICD-10-CM | POA: Diagnosis not present

## 2017-12-23 DIAGNOSIS — E119 Type 2 diabetes mellitus without complications: Secondary | ICD-10-CM | POA: Diagnosis not present

## 2017-12-23 DIAGNOSIS — D509 Iron deficiency anemia, unspecified: Secondary | ICD-10-CM | POA: Diagnosis not present

## 2017-12-23 DIAGNOSIS — N186 End stage renal disease: Secondary | ICD-10-CM | POA: Diagnosis not present

## 2017-12-24 DIAGNOSIS — E785 Hyperlipidemia, unspecified: Secondary | ICD-10-CM | POA: Diagnosis not present

## 2017-12-24 DIAGNOSIS — F209 Schizophrenia, unspecified: Secondary | ICD-10-CM | POA: Diagnosis not present

## 2017-12-24 DIAGNOSIS — E1122 Type 2 diabetes mellitus with diabetic chronic kidney disease: Secondary | ICD-10-CM | POA: Diagnosis not present

## 2017-12-24 DIAGNOSIS — K219 Gastro-esophageal reflux disease without esophagitis: Secondary | ICD-10-CM | POA: Diagnosis not present

## 2017-12-24 DIAGNOSIS — E1140 Type 2 diabetes mellitus with diabetic neuropathy, unspecified: Secondary | ICD-10-CM | POA: Diagnosis not present

## 2017-12-24 DIAGNOSIS — I10 Essential (primary) hypertension: Secondary | ICD-10-CM | POA: Diagnosis not present

## 2017-12-24 DIAGNOSIS — J069 Acute upper respiratory infection, unspecified: Secondary | ICD-10-CM | POA: Diagnosis not present

## 2017-12-24 DIAGNOSIS — N186 End stage renal disease: Secondary | ICD-10-CM | POA: Diagnosis not present

## 2017-12-24 DIAGNOSIS — Z125 Encounter for screening for malignant neoplasm of prostate: Secondary | ICD-10-CM | POA: Diagnosis not present

## 2017-12-25 DIAGNOSIS — D509 Iron deficiency anemia, unspecified: Secondary | ICD-10-CM | POA: Diagnosis not present

## 2017-12-25 DIAGNOSIS — N186 End stage renal disease: Secondary | ICD-10-CM | POA: Diagnosis not present

## 2017-12-25 DIAGNOSIS — E119 Type 2 diabetes mellitus without complications: Secondary | ICD-10-CM | POA: Diagnosis not present

## 2017-12-25 DIAGNOSIS — N2581 Secondary hyperparathyroidism of renal origin: Secondary | ICD-10-CM | POA: Diagnosis not present

## 2017-12-26 ENCOUNTER — Ambulatory Visit (INDEPENDENT_AMBULATORY_CARE_PROVIDER_SITE_OTHER): Payer: Self-pay | Admitting: *Deleted

## 2017-12-26 DIAGNOSIS — I4901 Ventricular fibrillation: Secondary | ICD-10-CM

## 2017-12-26 DIAGNOSIS — N032 Chronic nephritic syndrome with diffuse membranous glomerulonephritis: Secondary | ICD-10-CM | POA: Diagnosis not present

## 2017-12-26 DIAGNOSIS — Z95810 Presence of automatic (implantable) cardiac defibrillator: Secondary | ICD-10-CM

## 2017-12-26 DIAGNOSIS — N186 End stage renal disease: Secondary | ICD-10-CM | POA: Diagnosis not present

## 2017-12-26 DIAGNOSIS — Z992 Dependence on renal dialysis: Secondary | ICD-10-CM | POA: Diagnosis not present

## 2017-12-26 NOTE — Progress Notes (Signed)
Wound recheck in clinic.  Wound without redness or edema.  Superficial stitch removed from medial incision, antibiotic ointment and bandaid applied.  Incision edges approximated and otherwise well healed.  Patient and caregiver instructed to wash site daily with soap and water and reapply antibiotic ointment and bandaid daily x1-2 days.  Instructions also given to monitor for signs/symptoms of infection and call our office if any noted.  ROV with Dr. Lovena Le on 02/27/18.

## 2017-12-27 DIAGNOSIS — N186 End stage renal disease: Secondary | ICD-10-CM | POA: Diagnosis not present

## 2017-12-27 DIAGNOSIS — D509 Iron deficiency anemia, unspecified: Secondary | ICD-10-CM | POA: Diagnosis not present

## 2017-12-27 DIAGNOSIS — E119 Type 2 diabetes mellitus without complications: Secondary | ICD-10-CM | POA: Diagnosis not present

## 2017-12-27 DIAGNOSIS — N2581 Secondary hyperparathyroidism of renal origin: Secondary | ICD-10-CM | POA: Diagnosis not present

## 2017-12-30 DIAGNOSIS — N186 End stage renal disease: Secondary | ICD-10-CM | POA: Diagnosis not present

## 2017-12-30 DIAGNOSIS — N2581 Secondary hyperparathyroidism of renal origin: Secondary | ICD-10-CM | POA: Diagnosis not present

## 2017-12-30 DIAGNOSIS — D509 Iron deficiency anemia, unspecified: Secondary | ICD-10-CM | POA: Diagnosis not present

## 2017-12-30 DIAGNOSIS — E119 Type 2 diabetes mellitus without complications: Secondary | ICD-10-CM | POA: Diagnosis not present

## 2018-01-01 DIAGNOSIS — D509 Iron deficiency anemia, unspecified: Secondary | ICD-10-CM | POA: Diagnosis not present

## 2018-01-01 DIAGNOSIS — N186 End stage renal disease: Secondary | ICD-10-CM | POA: Diagnosis not present

## 2018-01-01 DIAGNOSIS — N2581 Secondary hyperparathyroidism of renal origin: Secondary | ICD-10-CM | POA: Diagnosis not present

## 2018-01-01 DIAGNOSIS — E119 Type 2 diabetes mellitus without complications: Secondary | ICD-10-CM | POA: Diagnosis not present

## 2018-01-03 DIAGNOSIS — D509 Iron deficiency anemia, unspecified: Secondary | ICD-10-CM | POA: Diagnosis not present

## 2018-01-03 DIAGNOSIS — E119 Type 2 diabetes mellitus without complications: Secondary | ICD-10-CM | POA: Diagnosis not present

## 2018-01-03 DIAGNOSIS — N186 End stage renal disease: Secondary | ICD-10-CM | POA: Diagnosis not present

## 2018-01-03 DIAGNOSIS — N2581 Secondary hyperparathyroidism of renal origin: Secondary | ICD-10-CM | POA: Diagnosis not present

## 2018-01-06 DIAGNOSIS — N2581 Secondary hyperparathyroidism of renal origin: Secondary | ICD-10-CM | POA: Diagnosis not present

## 2018-01-06 DIAGNOSIS — E119 Type 2 diabetes mellitus without complications: Secondary | ICD-10-CM | POA: Diagnosis not present

## 2018-01-06 DIAGNOSIS — D509 Iron deficiency anemia, unspecified: Secondary | ICD-10-CM | POA: Diagnosis not present

## 2018-01-06 DIAGNOSIS — N186 End stage renal disease: Secondary | ICD-10-CM | POA: Diagnosis not present

## 2018-01-07 DIAGNOSIS — F250 Schizoaffective disorder, bipolar type: Secondary | ICD-10-CM | POA: Diagnosis not present

## 2018-01-08 DIAGNOSIS — N2581 Secondary hyperparathyroidism of renal origin: Secondary | ICD-10-CM | POA: Diagnosis not present

## 2018-01-08 DIAGNOSIS — D509 Iron deficiency anemia, unspecified: Secondary | ICD-10-CM | POA: Diagnosis not present

## 2018-01-08 DIAGNOSIS — E119 Type 2 diabetes mellitus without complications: Secondary | ICD-10-CM | POA: Diagnosis not present

## 2018-01-08 DIAGNOSIS — N186 End stage renal disease: Secondary | ICD-10-CM | POA: Diagnosis not present

## 2018-01-10 DIAGNOSIS — N2581 Secondary hyperparathyroidism of renal origin: Secondary | ICD-10-CM | POA: Diagnosis not present

## 2018-01-10 DIAGNOSIS — D509 Iron deficiency anemia, unspecified: Secondary | ICD-10-CM | POA: Diagnosis not present

## 2018-01-10 DIAGNOSIS — E119 Type 2 diabetes mellitus without complications: Secondary | ICD-10-CM | POA: Diagnosis not present

## 2018-01-10 DIAGNOSIS — N186 End stage renal disease: Secondary | ICD-10-CM | POA: Diagnosis not present

## 2018-01-13 DIAGNOSIS — N2581 Secondary hyperparathyroidism of renal origin: Secondary | ICD-10-CM | POA: Diagnosis not present

## 2018-01-13 DIAGNOSIS — D509 Iron deficiency anemia, unspecified: Secondary | ICD-10-CM | POA: Diagnosis not present

## 2018-01-13 DIAGNOSIS — N186 End stage renal disease: Secondary | ICD-10-CM | POA: Diagnosis not present

## 2018-01-13 DIAGNOSIS — E119 Type 2 diabetes mellitus without complications: Secondary | ICD-10-CM | POA: Diagnosis not present

## 2018-01-15 DIAGNOSIS — N186 End stage renal disease: Secondary | ICD-10-CM | POA: Diagnosis not present

## 2018-01-15 DIAGNOSIS — E119 Type 2 diabetes mellitus without complications: Secondary | ICD-10-CM | POA: Diagnosis not present

## 2018-01-15 DIAGNOSIS — N2581 Secondary hyperparathyroidism of renal origin: Secondary | ICD-10-CM | POA: Diagnosis not present

## 2018-01-15 DIAGNOSIS — D509 Iron deficiency anemia, unspecified: Secondary | ICD-10-CM | POA: Diagnosis not present

## 2018-01-17 DIAGNOSIS — N2581 Secondary hyperparathyroidism of renal origin: Secondary | ICD-10-CM | POA: Diagnosis not present

## 2018-01-17 DIAGNOSIS — E119 Type 2 diabetes mellitus without complications: Secondary | ICD-10-CM | POA: Diagnosis not present

## 2018-01-17 DIAGNOSIS — N186 End stage renal disease: Secondary | ICD-10-CM | POA: Diagnosis not present

## 2018-01-17 DIAGNOSIS — D509 Iron deficiency anemia, unspecified: Secondary | ICD-10-CM | POA: Diagnosis not present

## 2018-01-20 DIAGNOSIS — N2581 Secondary hyperparathyroidism of renal origin: Secondary | ICD-10-CM | POA: Diagnosis not present

## 2018-01-20 DIAGNOSIS — N186 End stage renal disease: Secondary | ICD-10-CM | POA: Diagnosis not present

## 2018-01-20 DIAGNOSIS — E119 Type 2 diabetes mellitus without complications: Secondary | ICD-10-CM | POA: Diagnosis not present

## 2018-01-20 DIAGNOSIS — D509 Iron deficiency anemia, unspecified: Secondary | ICD-10-CM | POA: Diagnosis not present

## 2018-01-22 DIAGNOSIS — D509 Iron deficiency anemia, unspecified: Secondary | ICD-10-CM | POA: Diagnosis not present

## 2018-01-22 DIAGNOSIS — N186 End stage renal disease: Secondary | ICD-10-CM | POA: Diagnosis not present

## 2018-01-22 DIAGNOSIS — E119 Type 2 diabetes mellitus without complications: Secondary | ICD-10-CM | POA: Diagnosis not present

## 2018-01-22 DIAGNOSIS — N2581 Secondary hyperparathyroidism of renal origin: Secondary | ICD-10-CM | POA: Diagnosis not present

## 2018-01-24 DIAGNOSIS — N186 End stage renal disease: Secondary | ICD-10-CM | POA: Diagnosis not present

## 2018-01-24 DIAGNOSIS — D509 Iron deficiency anemia, unspecified: Secondary | ICD-10-CM | POA: Diagnosis not present

## 2018-01-24 DIAGNOSIS — E119 Type 2 diabetes mellitus without complications: Secondary | ICD-10-CM | POA: Diagnosis not present

## 2018-01-24 DIAGNOSIS — N2581 Secondary hyperparathyroidism of renal origin: Secondary | ICD-10-CM | POA: Diagnosis not present

## 2018-01-26 DIAGNOSIS — Z992 Dependence on renal dialysis: Secondary | ICD-10-CM | POA: Diagnosis not present

## 2018-01-26 DIAGNOSIS — N186 End stage renal disease: Secondary | ICD-10-CM | POA: Diagnosis not present

## 2018-01-26 DIAGNOSIS — N032 Chronic nephritic syndrome with diffuse membranous glomerulonephritis: Secondary | ICD-10-CM | POA: Diagnosis not present

## 2018-01-27 DIAGNOSIS — E119 Type 2 diabetes mellitus without complications: Secondary | ICD-10-CM | POA: Diagnosis not present

## 2018-01-27 DIAGNOSIS — D509 Iron deficiency anemia, unspecified: Secondary | ICD-10-CM | POA: Diagnosis not present

## 2018-01-27 DIAGNOSIS — N2581 Secondary hyperparathyroidism of renal origin: Secondary | ICD-10-CM | POA: Diagnosis not present

## 2018-01-27 DIAGNOSIS — N186 End stage renal disease: Secondary | ICD-10-CM | POA: Diagnosis not present

## 2018-01-29 DIAGNOSIS — E119 Type 2 diabetes mellitus without complications: Secondary | ICD-10-CM | POA: Diagnosis not present

## 2018-01-29 DIAGNOSIS — N2581 Secondary hyperparathyroidism of renal origin: Secondary | ICD-10-CM | POA: Diagnosis not present

## 2018-01-29 DIAGNOSIS — D509 Iron deficiency anemia, unspecified: Secondary | ICD-10-CM | POA: Diagnosis not present

## 2018-01-29 DIAGNOSIS — N186 End stage renal disease: Secondary | ICD-10-CM | POA: Diagnosis not present

## 2018-01-31 DIAGNOSIS — D509 Iron deficiency anemia, unspecified: Secondary | ICD-10-CM | POA: Diagnosis not present

## 2018-01-31 DIAGNOSIS — N186 End stage renal disease: Secondary | ICD-10-CM | POA: Diagnosis not present

## 2018-01-31 DIAGNOSIS — E119 Type 2 diabetes mellitus without complications: Secondary | ICD-10-CM | POA: Diagnosis not present

## 2018-01-31 DIAGNOSIS — N2581 Secondary hyperparathyroidism of renal origin: Secondary | ICD-10-CM | POA: Diagnosis not present

## 2018-02-03 DIAGNOSIS — D509 Iron deficiency anemia, unspecified: Secondary | ICD-10-CM | POA: Diagnosis not present

## 2018-02-03 DIAGNOSIS — N2581 Secondary hyperparathyroidism of renal origin: Secondary | ICD-10-CM | POA: Diagnosis not present

## 2018-02-03 DIAGNOSIS — E119 Type 2 diabetes mellitus without complications: Secondary | ICD-10-CM | POA: Diagnosis not present

## 2018-02-03 DIAGNOSIS — N186 End stage renal disease: Secondary | ICD-10-CM | POA: Diagnosis not present

## 2018-02-05 DIAGNOSIS — N2581 Secondary hyperparathyroidism of renal origin: Secondary | ICD-10-CM | POA: Diagnosis not present

## 2018-02-05 DIAGNOSIS — E1129 Type 2 diabetes mellitus with other diabetic kidney complication: Secondary | ICD-10-CM | POA: Diagnosis not present

## 2018-02-05 DIAGNOSIS — N186 End stage renal disease: Secondary | ICD-10-CM | POA: Diagnosis not present

## 2018-02-05 DIAGNOSIS — E119 Type 2 diabetes mellitus without complications: Secondary | ICD-10-CM | POA: Diagnosis not present

## 2018-02-05 DIAGNOSIS — D509 Iron deficiency anemia, unspecified: Secondary | ICD-10-CM | POA: Diagnosis not present

## 2018-02-07 DIAGNOSIS — D509 Iron deficiency anemia, unspecified: Secondary | ICD-10-CM | POA: Diagnosis not present

## 2018-02-07 DIAGNOSIS — E119 Type 2 diabetes mellitus without complications: Secondary | ICD-10-CM | POA: Diagnosis not present

## 2018-02-07 DIAGNOSIS — N2581 Secondary hyperparathyroidism of renal origin: Secondary | ICD-10-CM | POA: Diagnosis not present

## 2018-02-07 DIAGNOSIS — N186 End stage renal disease: Secondary | ICD-10-CM | POA: Diagnosis not present

## 2018-02-10 DIAGNOSIS — D509 Iron deficiency anemia, unspecified: Secondary | ICD-10-CM | POA: Diagnosis not present

## 2018-02-10 DIAGNOSIS — N186 End stage renal disease: Secondary | ICD-10-CM | POA: Diagnosis not present

## 2018-02-10 DIAGNOSIS — E119 Type 2 diabetes mellitus without complications: Secondary | ICD-10-CM | POA: Diagnosis not present

## 2018-02-10 DIAGNOSIS — N2581 Secondary hyperparathyroidism of renal origin: Secondary | ICD-10-CM | POA: Diagnosis not present

## 2018-02-12 DIAGNOSIS — N2581 Secondary hyperparathyroidism of renal origin: Secondary | ICD-10-CM | POA: Diagnosis not present

## 2018-02-12 DIAGNOSIS — N186 End stage renal disease: Secondary | ICD-10-CM | POA: Diagnosis not present

## 2018-02-12 DIAGNOSIS — E119 Type 2 diabetes mellitus without complications: Secondary | ICD-10-CM | POA: Diagnosis not present

## 2018-02-12 DIAGNOSIS — D509 Iron deficiency anemia, unspecified: Secondary | ICD-10-CM | POA: Diagnosis not present

## 2018-02-14 DIAGNOSIS — D509 Iron deficiency anemia, unspecified: Secondary | ICD-10-CM | POA: Diagnosis not present

## 2018-02-14 DIAGNOSIS — N2581 Secondary hyperparathyroidism of renal origin: Secondary | ICD-10-CM | POA: Diagnosis not present

## 2018-02-14 DIAGNOSIS — E119 Type 2 diabetes mellitus without complications: Secondary | ICD-10-CM | POA: Diagnosis not present

## 2018-02-14 DIAGNOSIS — N186 End stage renal disease: Secondary | ICD-10-CM | POA: Diagnosis not present

## 2018-02-17 ENCOUNTER — Encounter: Payer: Self-pay | Admitting: Internal Medicine

## 2018-02-17 DIAGNOSIS — N2581 Secondary hyperparathyroidism of renal origin: Secondary | ICD-10-CM | POA: Diagnosis not present

## 2018-02-17 DIAGNOSIS — D509 Iron deficiency anemia, unspecified: Secondary | ICD-10-CM | POA: Diagnosis not present

## 2018-02-17 DIAGNOSIS — E119 Type 2 diabetes mellitus without complications: Secondary | ICD-10-CM | POA: Diagnosis not present

## 2018-02-17 DIAGNOSIS — N186 End stage renal disease: Secondary | ICD-10-CM | POA: Diagnosis not present

## 2018-02-19 DIAGNOSIS — N186 End stage renal disease: Secondary | ICD-10-CM | POA: Diagnosis not present

## 2018-02-19 DIAGNOSIS — D509 Iron deficiency anemia, unspecified: Secondary | ICD-10-CM | POA: Diagnosis not present

## 2018-02-19 DIAGNOSIS — E119 Type 2 diabetes mellitus without complications: Secondary | ICD-10-CM | POA: Diagnosis not present

## 2018-02-19 DIAGNOSIS — N2581 Secondary hyperparathyroidism of renal origin: Secondary | ICD-10-CM | POA: Diagnosis not present

## 2018-02-21 DIAGNOSIS — N186 End stage renal disease: Secondary | ICD-10-CM | POA: Diagnosis not present

## 2018-02-21 DIAGNOSIS — D509 Iron deficiency anemia, unspecified: Secondary | ICD-10-CM | POA: Diagnosis not present

## 2018-02-21 DIAGNOSIS — E119 Type 2 diabetes mellitus without complications: Secondary | ICD-10-CM | POA: Diagnosis not present

## 2018-02-21 DIAGNOSIS — N2581 Secondary hyperparathyroidism of renal origin: Secondary | ICD-10-CM | POA: Diagnosis not present

## 2018-02-24 DIAGNOSIS — N186 End stage renal disease: Secondary | ICD-10-CM | POA: Diagnosis not present

## 2018-02-24 DIAGNOSIS — E119 Type 2 diabetes mellitus without complications: Secondary | ICD-10-CM | POA: Diagnosis not present

## 2018-02-24 DIAGNOSIS — N2581 Secondary hyperparathyroidism of renal origin: Secondary | ICD-10-CM | POA: Diagnosis not present

## 2018-02-24 DIAGNOSIS — D509 Iron deficiency anemia, unspecified: Secondary | ICD-10-CM | POA: Diagnosis not present

## 2018-02-25 DIAGNOSIS — N032 Chronic nephritic syndrome with diffuse membranous glomerulonephritis: Secondary | ICD-10-CM | POA: Diagnosis not present

## 2018-02-25 DIAGNOSIS — Z992 Dependence on renal dialysis: Secondary | ICD-10-CM | POA: Diagnosis not present

## 2018-02-25 DIAGNOSIS — N186 End stage renal disease: Secondary | ICD-10-CM | POA: Diagnosis not present

## 2018-02-26 DIAGNOSIS — D509 Iron deficiency anemia, unspecified: Secondary | ICD-10-CM | POA: Diagnosis not present

## 2018-02-26 DIAGNOSIS — E119 Type 2 diabetes mellitus without complications: Secondary | ICD-10-CM | POA: Diagnosis not present

## 2018-02-26 DIAGNOSIS — N2581 Secondary hyperparathyroidism of renal origin: Secondary | ICD-10-CM | POA: Diagnosis not present

## 2018-02-26 DIAGNOSIS — N186 End stage renal disease: Secondary | ICD-10-CM | POA: Diagnosis not present

## 2018-02-27 ENCOUNTER — Encounter: Payer: Medicare Other | Admitting: Internal Medicine

## 2018-02-28 DIAGNOSIS — E119 Type 2 diabetes mellitus without complications: Secondary | ICD-10-CM | POA: Diagnosis not present

## 2018-02-28 DIAGNOSIS — D509 Iron deficiency anemia, unspecified: Secondary | ICD-10-CM | POA: Diagnosis not present

## 2018-02-28 DIAGNOSIS — N186 End stage renal disease: Secondary | ICD-10-CM | POA: Diagnosis not present

## 2018-02-28 DIAGNOSIS — N2581 Secondary hyperparathyroidism of renal origin: Secondary | ICD-10-CM | POA: Diagnosis not present

## 2018-03-03 DIAGNOSIS — N2581 Secondary hyperparathyroidism of renal origin: Secondary | ICD-10-CM | POA: Diagnosis not present

## 2018-03-03 DIAGNOSIS — E119 Type 2 diabetes mellitus without complications: Secondary | ICD-10-CM | POA: Diagnosis not present

## 2018-03-03 DIAGNOSIS — D509 Iron deficiency anemia, unspecified: Secondary | ICD-10-CM | POA: Diagnosis not present

## 2018-03-03 DIAGNOSIS — N186 End stage renal disease: Secondary | ICD-10-CM | POA: Diagnosis not present

## 2018-03-05 DIAGNOSIS — D509 Iron deficiency anemia, unspecified: Secondary | ICD-10-CM | POA: Diagnosis not present

## 2018-03-05 DIAGNOSIS — E119 Type 2 diabetes mellitus without complications: Secondary | ICD-10-CM | POA: Diagnosis not present

## 2018-03-05 DIAGNOSIS — N186 End stage renal disease: Secondary | ICD-10-CM | POA: Diagnosis not present

## 2018-03-05 DIAGNOSIS — N2581 Secondary hyperparathyroidism of renal origin: Secondary | ICD-10-CM | POA: Diagnosis not present

## 2018-03-06 DIAGNOSIS — N2581 Secondary hyperparathyroidism of renal origin: Secondary | ICD-10-CM | POA: Diagnosis not present

## 2018-03-06 DIAGNOSIS — N186 End stage renal disease: Secondary | ICD-10-CM | POA: Diagnosis not present

## 2018-03-06 DIAGNOSIS — E119 Type 2 diabetes mellitus without complications: Secondary | ICD-10-CM | POA: Diagnosis not present

## 2018-03-06 DIAGNOSIS — D509 Iron deficiency anemia, unspecified: Secondary | ICD-10-CM | POA: Diagnosis not present

## 2018-03-09 ENCOUNTER — Telehealth: Payer: Self-pay | Admitting: *Deleted

## 2018-03-09 DIAGNOSIS — E119 Type 2 diabetes mellitus without complications: Secondary | ICD-10-CM | POA: Diagnosis not present

## 2018-03-09 DIAGNOSIS — N186 End stage renal disease: Secondary | ICD-10-CM | POA: Diagnosis not present

## 2018-03-09 DIAGNOSIS — N2581 Secondary hyperparathyroidism of renal origin: Secondary | ICD-10-CM | POA: Diagnosis not present

## 2018-03-09 DIAGNOSIS — D509 Iron deficiency anemia, unspecified: Secondary | ICD-10-CM | POA: Diagnosis not present

## 2018-03-09 NOTE — Telephone Encounter (Signed)
Called MDT tech svcs to review patient's increase in bipolar impedance measurement. Spoke with Lysbeth Galas, MDT who believes the increase is d/t some kind of physiological change vs. An issue with the lead itself.

## 2018-03-09 NOTE — Telephone Encounter (Signed)
Spoke with Dr.Klein regarding patient's lead measurement. Dr.Klein believes the ring electrode is fractured. Dr.Klein recommended patient's sensing be changed to RVtip to coil. Dannial Monarch, MDT notified as patient is unable to come into office until next week (per caregiver). Contact information for patient/caregiver provided to Dannial Monarch.  Caregiver expecting call.

## 2018-03-09 NOTE — Telephone Encounter (Signed)
RV bipolar lead impedance warning reviewed.  Impedance is now 1007ohms, trigger is >1000ohms - can increase to 2000ohms per protocol. RV threshold - chronically elevated. Appropriate sensing.  Attempted to call patient's cell # - mailbox is full. Called home # - person answered phone, states patient isn't available, but states that he is unable to take message or give information on how to reach patient. Person then ended the call.

## 2018-03-11 DIAGNOSIS — E119 Type 2 diabetes mellitus without complications: Secondary | ICD-10-CM | POA: Diagnosis not present

## 2018-03-11 DIAGNOSIS — D509 Iron deficiency anemia, unspecified: Secondary | ICD-10-CM | POA: Diagnosis not present

## 2018-03-11 DIAGNOSIS — N2581 Secondary hyperparathyroidism of renal origin: Secondary | ICD-10-CM | POA: Diagnosis not present

## 2018-03-11 DIAGNOSIS — N186 End stage renal disease: Secondary | ICD-10-CM | POA: Diagnosis not present

## 2018-03-13 DIAGNOSIS — N2581 Secondary hyperparathyroidism of renal origin: Secondary | ICD-10-CM | POA: Diagnosis not present

## 2018-03-13 DIAGNOSIS — D509 Iron deficiency anemia, unspecified: Secondary | ICD-10-CM | POA: Diagnosis not present

## 2018-03-13 DIAGNOSIS — N186 End stage renal disease: Secondary | ICD-10-CM | POA: Diagnosis not present

## 2018-03-13 DIAGNOSIS — E119 Type 2 diabetes mellitus without complications: Secondary | ICD-10-CM | POA: Diagnosis not present

## 2018-03-16 DIAGNOSIS — E119 Type 2 diabetes mellitus without complications: Secondary | ICD-10-CM | POA: Diagnosis not present

## 2018-03-16 DIAGNOSIS — N2581 Secondary hyperparathyroidism of renal origin: Secondary | ICD-10-CM | POA: Diagnosis not present

## 2018-03-16 DIAGNOSIS — D509 Iron deficiency anemia, unspecified: Secondary | ICD-10-CM | POA: Diagnosis not present

## 2018-03-16 DIAGNOSIS — N186 End stage renal disease: Secondary | ICD-10-CM | POA: Diagnosis not present

## 2018-03-18 ENCOUNTER — Encounter: Payer: Medicare Other | Admitting: Internal Medicine

## 2018-03-18 DIAGNOSIS — N186 End stage renal disease: Secondary | ICD-10-CM | POA: Diagnosis not present

## 2018-03-18 DIAGNOSIS — E119 Type 2 diabetes mellitus without complications: Secondary | ICD-10-CM | POA: Diagnosis not present

## 2018-03-18 DIAGNOSIS — N2581 Secondary hyperparathyroidism of renal origin: Secondary | ICD-10-CM | POA: Diagnosis not present

## 2018-03-18 DIAGNOSIS — D509 Iron deficiency anemia, unspecified: Secondary | ICD-10-CM | POA: Diagnosis not present

## 2018-03-20 ENCOUNTER — Other Ambulatory Visit: Payer: Self-pay | Admitting: Internal Medicine

## 2018-03-20 DIAGNOSIS — D509 Iron deficiency anemia, unspecified: Secondary | ICD-10-CM | POA: Diagnosis not present

## 2018-03-20 DIAGNOSIS — N186 End stage renal disease: Secondary | ICD-10-CM | POA: Diagnosis not present

## 2018-03-20 DIAGNOSIS — E119 Type 2 diabetes mellitus without complications: Secondary | ICD-10-CM | POA: Diagnosis not present

## 2018-03-20 DIAGNOSIS — N2581 Secondary hyperparathyroidism of renal origin: Secondary | ICD-10-CM | POA: Diagnosis not present

## 2018-03-23 DIAGNOSIS — E119 Type 2 diabetes mellitus without complications: Secondary | ICD-10-CM | POA: Diagnosis not present

## 2018-03-23 DIAGNOSIS — N2581 Secondary hyperparathyroidism of renal origin: Secondary | ICD-10-CM | POA: Diagnosis not present

## 2018-03-23 DIAGNOSIS — D509 Iron deficiency anemia, unspecified: Secondary | ICD-10-CM | POA: Diagnosis not present

## 2018-03-23 DIAGNOSIS — N186 End stage renal disease: Secondary | ICD-10-CM | POA: Diagnosis not present

## 2018-03-25 DIAGNOSIS — E119 Type 2 diabetes mellitus without complications: Secondary | ICD-10-CM | POA: Diagnosis not present

## 2018-03-25 DIAGNOSIS — N2581 Secondary hyperparathyroidism of renal origin: Secondary | ICD-10-CM | POA: Diagnosis not present

## 2018-03-25 DIAGNOSIS — D509 Iron deficiency anemia, unspecified: Secondary | ICD-10-CM | POA: Diagnosis not present

## 2018-03-25 DIAGNOSIS — N186 End stage renal disease: Secondary | ICD-10-CM | POA: Diagnosis not present

## 2018-03-27 DIAGNOSIS — N186 End stage renal disease: Secondary | ICD-10-CM | POA: Diagnosis not present

## 2018-03-27 DIAGNOSIS — E119 Type 2 diabetes mellitus without complications: Secondary | ICD-10-CM | POA: Diagnosis not present

## 2018-03-27 DIAGNOSIS — D509 Iron deficiency anemia, unspecified: Secondary | ICD-10-CM | POA: Diagnosis not present

## 2018-03-27 DIAGNOSIS — N2581 Secondary hyperparathyroidism of renal origin: Secondary | ICD-10-CM | POA: Diagnosis not present

## 2018-03-28 DIAGNOSIS — N032 Chronic nephritic syndrome with diffuse membranous glomerulonephritis: Secondary | ICD-10-CM | POA: Diagnosis not present

## 2018-03-28 DIAGNOSIS — N186 End stage renal disease: Secondary | ICD-10-CM | POA: Diagnosis not present

## 2018-03-28 DIAGNOSIS — Z992 Dependence on renal dialysis: Secondary | ICD-10-CM | POA: Diagnosis not present

## 2018-03-30 DIAGNOSIS — N186 End stage renal disease: Secondary | ICD-10-CM | POA: Diagnosis not present

## 2018-03-30 DIAGNOSIS — N2581 Secondary hyperparathyroidism of renal origin: Secondary | ICD-10-CM | POA: Diagnosis not present

## 2018-03-30 DIAGNOSIS — D509 Iron deficiency anemia, unspecified: Secondary | ICD-10-CM | POA: Diagnosis not present

## 2018-03-30 DIAGNOSIS — E119 Type 2 diabetes mellitus without complications: Secondary | ICD-10-CM | POA: Diagnosis not present

## 2018-04-01 DIAGNOSIS — N2581 Secondary hyperparathyroidism of renal origin: Secondary | ICD-10-CM | POA: Diagnosis not present

## 2018-04-01 DIAGNOSIS — E119 Type 2 diabetes mellitus without complications: Secondary | ICD-10-CM | POA: Diagnosis not present

## 2018-04-01 DIAGNOSIS — N186 End stage renal disease: Secondary | ICD-10-CM | POA: Diagnosis not present

## 2018-04-01 DIAGNOSIS — D509 Iron deficiency anemia, unspecified: Secondary | ICD-10-CM | POA: Diagnosis not present

## 2018-04-03 DIAGNOSIS — D509 Iron deficiency anemia, unspecified: Secondary | ICD-10-CM | POA: Diagnosis not present

## 2018-04-03 DIAGNOSIS — N2581 Secondary hyperparathyroidism of renal origin: Secondary | ICD-10-CM | POA: Diagnosis not present

## 2018-04-03 DIAGNOSIS — N186 End stage renal disease: Secondary | ICD-10-CM | POA: Diagnosis not present

## 2018-04-03 DIAGNOSIS — E119 Type 2 diabetes mellitus without complications: Secondary | ICD-10-CM | POA: Diagnosis not present

## 2018-04-06 DIAGNOSIS — E119 Type 2 diabetes mellitus without complications: Secondary | ICD-10-CM | POA: Diagnosis not present

## 2018-04-06 DIAGNOSIS — N2581 Secondary hyperparathyroidism of renal origin: Secondary | ICD-10-CM | POA: Diagnosis not present

## 2018-04-06 DIAGNOSIS — D509 Iron deficiency anemia, unspecified: Secondary | ICD-10-CM | POA: Diagnosis not present

## 2018-04-06 DIAGNOSIS — N186 End stage renal disease: Secondary | ICD-10-CM | POA: Diagnosis not present

## 2018-04-08 DIAGNOSIS — N186 End stage renal disease: Secondary | ICD-10-CM | POA: Diagnosis not present

## 2018-04-08 DIAGNOSIS — E119 Type 2 diabetes mellitus without complications: Secondary | ICD-10-CM | POA: Diagnosis not present

## 2018-04-08 DIAGNOSIS — N2581 Secondary hyperparathyroidism of renal origin: Secondary | ICD-10-CM | POA: Diagnosis not present

## 2018-04-08 DIAGNOSIS — D509 Iron deficiency anemia, unspecified: Secondary | ICD-10-CM | POA: Diagnosis not present

## 2018-04-10 DIAGNOSIS — D509 Iron deficiency anemia, unspecified: Secondary | ICD-10-CM | POA: Diagnosis not present

## 2018-04-10 DIAGNOSIS — E119 Type 2 diabetes mellitus without complications: Secondary | ICD-10-CM | POA: Diagnosis not present

## 2018-04-10 DIAGNOSIS — N2581 Secondary hyperparathyroidism of renal origin: Secondary | ICD-10-CM | POA: Diagnosis not present

## 2018-04-10 DIAGNOSIS — N186 End stage renal disease: Secondary | ICD-10-CM | POA: Diagnosis not present

## 2018-04-13 DIAGNOSIS — D509 Iron deficiency anemia, unspecified: Secondary | ICD-10-CM | POA: Diagnosis not present

## 2018-04-13 DIAGNOSIS — N186 End stage renal disease: Secondary | ICD-10-CM | POA: Diagnosis not present

## 2018-04-13 DIAGNOSIS — E119 Type 2 diabetes mellitus without complications: Secondary | ICD-10-CM | POA: Diagnosis not present

## 2018-04-13 DIAGNOSIS — N2581 Secondary hyperparathyroidism of renal origin: Secondary | ICD-10-CM | POA: Diagnosis not present

## 2018-04-14 DIAGNOSIS — F250 Schizoaffective disorder, bipolar type: Secondary | ICD-10-CM | POA: Diagnosis not present

## 2018-04-15 DIAGNOSIS — N2581 Secondary hyperparathyroidism of renal origin: Secondary | ICD-10-CM | POA: Diagnosis not present

## 2018-04-15 DIAGNOSIS — N186 End stage renal disease: Secondary | ICD-10-CM | POA: Diagnosis not present

## 2018-04-15 DIAGNOSIS — D509 Iron deficiency anemia, unspecified: Secondary | ICD-10-CM | POA: Diagnosis not present

## 2018-04-15 DIAGNOSIS — E119 Type 2 diabetes mellitus without complications: Secondary | ICD-10-CM | POA: Diagnosis not present

## 2018-04-17 DIAGNOSIS — N2581 Secondary hyperparathyroidism of renal origin: Secondary | ICD-10-CM | POA: Diagnosis not present

## 2018-04-17 DIAGNOSIS — N186 End stage renal disease: Secondary | ICD-10-CM | POA: Diagnosis not present

## 2018-04-17 DIAGNOSIS — E119 Type 2 diabetes mellitus without complications: Secondary | ICD-10-CM | POA: Diagnosis not present

## 2018-04-17 DIAGNOSIS — D509 Iron deficiency anemia, unspecified: Secondary | ICD-10-CM | POA: Diagnosis not present

## 2018-04-20 DIAGNOSIS — N2581 Secondary hyperparathyroidism of renal origin: Secondary | ICD-10-CM | POA: Diagnosis not present

## 2018-04-20 DIAGNOSIS — E119 Type 2 diabetes mellitus without complications: Secondary | ICD-10-CM | POA: Diagnosis not present

## 2018-04-20 DIAGNOSIS — D509 Iron deficiency anemia, unspecified: Secondary | ICD-10-CM | POA: Diagnosis not present

## 2018-04-20 DIAGNOSIS — N186 End stage renal disease: Secondary | ICD-10-CM | POA: Diagnosis not present

## 2018-04-22 DIAGNOSIS — N2581 Secondary hyperparathyroidism of renal origin: Secondary | ICD-10-CM | POA: Diagnosis not present

## 2018-04-22 DIAGNOSIS — E119 Type 2 diabetes mellitus without complications: Secondary | ICD-10-CM | POA: Diagnosis not present

## 2018-04-22 DIAGNOSIS — D509 Iron deficiency anemia, unspecified: Secondary | ICD-10-CM | POA: Diagnosis not present

## 2018-04-22 DIAGNOSIS — N186 End stage renal disease: Secondary | ICD-10-CM | POA: Diagnosis not present

## 2018-04-24 DIAGNOSIS — D509 Iron deficiency anemia, unspecified: Secondary | ICD-10-CM | POA: Diagnosis not present

## 2018-04-24 DIAGNOSIS — N2581 Secondary hyperparathyroidism of renal origin: Secondary | ICD-10-CM | POA: Diagnosis not present

## 2018-04-24 DIAGNOSIS — N186 End stage renal disease: Secondary | ICD-10-CM | POA: Diagnosis not present

## 2018-04-24 DIAGNOSIS — E119 Type 2 diabetes mellitus without complications: Secondary | ICD-10-CM | POA: Diagnosis not present

## 2018-04-27 DIAGNOSIS — N032 Chronic nephritic syndrome with diffuse membranous glomerulonephritis: Secondary | ICD-10-CM | POA: Diagnosis not present

## 2018-04-27 DIAGNOSIS — N2581 Secondary hyperparathyroidism of renal origin: Secondary | ICD-10-CM | POA: Diagnosis not present

## 2018-04-27 DIAGNOSIS — E119 Type 2 diabetes mellitus without complications: Secondary | ICD-10-CM | POA: Diagnosis not present

## 2018-04-27 DIAGNOSIS — N186 End stage renal disease: Secondary | ICD-10-CM | POA: Diagnosis not present

## 2018-04-27 DIAGNOSIS — D509 Iron deficiency anemia, unspecified: Secondary | ICD-10-CM | POA: Diagnosis not present

## 2018-04-27 DIAGNOSIS — Z992 Dependence on renal dialysis: Secondary | ICD-10-CM | POA: Diagnosis not present

## 2018-04-29 DIAGNOSIS — E119 Type 2 diabetes mellitus without complications: Secondary | ICD-10-CM | POA: Diagnosis not present

## 2018-04-29 DIAGNOSIS — N2581 Secondary hyperparathyroidism of renal origin: Secondary | ICD-10-CM | POA: Diagnosis not present

## 2018-04-29 DIAGNOSIS — D509 Iron deficiency anemia, unspecified: Secondary | ICD-10-CM | POA: Diagnosis not present

## 2018-04-29 DIAGNOSIS — N186 End stage renal disease: Secondary | ICD-10-CM | POA: Diagnosis not present

## 2018-05-01 DIAGNOSIS — D509 Iron deficiency anemia, unspecified: Secondary | ICD-10-CM | POA: Diagnosis not present

## 2018-05-01 DIAGNOSIS — N2581 Secondary hyperparathyroidism of renal origin: Secondary | ICD-10-CM | POA: Diagnosis not present

## 2018-05-01 DIAGNOSIS — E119 Type 2 diabetes mellitus without complications: Secondary | ICD-10-CM | POA: Diagnosis not present

## 2018-05-01 DIAGNOSIS — N186 End stage renal disease: Secondary | ICD-10-CM | POA: Diagnosis not present

## 2018-05-04 ENCOUNTER — Telehealth: Payer: Self-pay | Admitting: Internal Medicine

## 2018-05-04 DIAGNOSIS — D509 Iron deficiency anemia, unspecified: Secondary | ICD-10-CM | POA: Diagnosis not present

## 2018-05-04 DIAGNOSIS — E119 Type 2 diabetes mellitus without complications: Secondary | ICD-10-CM | POA: Diagnosis not present

## 2018-05-04 DIAGNOSIS — N186 End stage renal disease: Secondary | ICD-10-CM | POA: Diagnosis not present

## 2018-05-04 DIAGNOSIS — N2581 Secondary hyperparathyroidism of renal origin: Secondary | ICD-10-CM | POA: Diagnosis not present

## 2018-05-04 NOTE — Telephone Encounter (Signed)
Spoke with Gerald Stabs who stated that pt is in dialysis and probably would not get back to home until after 5 today due to transportation of other patients. Informed caregiver to call device clinic back tomorrow when pt is near monitor and someone would walk him through sending a transmission. Gerald Stabs voiced understanding

## 2018-05-04 NOTE — Telephone Encounter (Signed)
New message    1. Has your device fired? NO  2. Is you device beeping? YES  3. Are you experiencing draining or swelling at device site? NO  4. Are you calling to see if we received your device transmission? NO  5. Have you passed out? NO    Please route to [place]

## 2018-05-05 NOTE — Telephone Encounter (Signed)
Spoke w/ pt caregiver and instructed him how to send a manual transmission w/ pt home monitor.

## 2018-05-05 NOTE — Telephone Encounter (Signed)
Transmission received. Alert for bipolar impedance (programmed tip>coil), and high RV threshold. These are known issues and need to be discussed with Dr. Lovena Le at 05/08/18 appointment. Chris aware.

## 2018-05-06 DIAGNOSIS — N186 End stage renal disease: Secondary | ICD-10-CM | POA: Diagnosis not present

## 2018-05-06 DIAGNOSIS — E1129 Type 2 diabetes mellitus with other diabetic kidney complication: Secondary | ICD-10-CM | POA: Diagnosis not present

## 2018-05-06 DIAGNOSIS — E119 Type 2 diabetes mellitus without complications: Secondary | ICD-10-CM | POA: Diagnosis not present

## 2018-05-06 DIAGNOSIS — D509 Iron deficiency anemia, unspecified: Secondary | ICD-10-CM | POA: Diagnosis not present

## 2018-05-06 DIAGNOSIS — N2581 Secondary hyperparathyroidism of renal origin: Secondary | ICD-10-CM | POA: Diagnosis not present

## 2018-05-08 ENCOUNTER — Encounter: Payer: Medicare Other | Admitting: Internal Medicine

## 2018-05-08 DIAGNOSIS — D509 Iron deficiency anemia, unspecified: Secondary | ICD-10-CM | POA: Diagnosis not present

## 2018-05-08 DIAGNOSIS — N186 End stage renal disease: Secondary | ICD-10-CM | POA: Diagnosis not present

## 2018-05-08 DIAGNOSIS — E119 Type 2 diabetes mellitus without complications: Secondary | ICD-10-CM | POA: Diagnosis not present

## 2018-05-08 DIAGNOSIS — N2581 Secondary hyperparathyroidism of renal origin: Secondary | ICD-10-CM | POA: Diagnosis not present

## 2018-05-11 DIAGNOSIS — D509 Iron deficiency anemia, unspecified: Secondary | ICD-10-CM | POA: Diagnosis not present

## 2018-05-11 DIAGNOSIS — N2581 Secondary hyperparathyroidism of renal origin: Secondary | ICD-10-CM | POA: Diagnosis not present

## 2018-05-11 DIAGNOSIS — E119 Type 2 diabetes mellitus without complications: Secondary | ICD-10-CM | POA: Diagnosis not present

## 2018-05-11 DIAGNOSIS — N186 End stage renal disease: Secondary | ICD-10-CM | POA: Diagnosis not present

## 2018-05-13 DIAGNOSIS — N186 End stage renal disease: Secondary | ICD-10-CM | POA: Diagnosis not present

## 2018-05-13 DIAGNOSIS — D509 Iron deficiency anemia, unspecified: Secondary | ICD-10-CM | POA: Diagnosis not present

## 2018-05-13 DIAGNOSIS — N2581 Secondary hyperparathyroidism of renal origin: Secondary | ICD-10-CM | POA: Diagnosis not present

## 2018-05-13 DIAGNOSIS — E119 Type 2 diabetes mellitus without complications: Secondary | ICD-10-CM | POA: Diagnosis not present

## 2018-05-15 DIAGNOSIS — N2581 Secondary hyperparathyroidism of renal origin: Secondary | ICD-10-CM | POA: Diagnosis not present

## 2018-05-15 DIAGNOSIS — N186 End stage renal disease: Secondary | ICD-10-CM | POA: Diagnosis not present

## 2018-05-15 DIAGNOSIS — E119 Type 2 diabetes mellitus without complications: Secondary | ICD-10-CM | POA: Diagnosis not present

## 2018-05-15 DIAGNOSIS — D509 Iron deficiency anemia, unspecified: Secondary | ICD-10-CM | POA: Diagnosis not present

## 2018-05-18 DIAGNOSIS — N186 End stage renal disease: Secondary | ICD-10-CM | POA: Diagnosis not present

## 2018-05-18 DIAGNOSIS — E119 Type 2 diabetes mellitus without complications: Secondary | ICD-10-CM | POA: Diagnosis not present

## 2018-05-18 DIAGNOSIS — D509 Iron deficiency anemia, unspecified: Secondary | ICD-10-CM | POA: Diagnosis not present

## 2018-05-18 DIAGNOSIS — N2581 Secondary hyperparathyroidism of renal origin: Secondary | ICD-10-CM | POA: Diagnosis not present

## 2018-05-20 DIAGNOSIS — E119 Type 2 diabetes mellitus without complications: Secondary | ICD-10-CM | POA: Diagnosis not present

## 2018-05-20 DIAGNOSIS — N2581 Secondary hyperparathyroidism of renal origin: Secondary | ICD-10-CM | POA: Diagnosis not present

## 2018-05-20 DIAGNOSIS — D509 Iron deficiency anemia, unspecified: Secondary | ICD-10-CM | POA: Diagnosis not present

## 2018-05-20 DIAGNOSIS — N186 End stage renal disease: Secondary | ICD-10-CM | POA: Diagnosis not present

## 2018-05-22 DIAGNOSIS — N2581 Secondary hyperparathyroidism of renal origin: Secondary | ICD-10-CM | POA: Diagnosis not present

## 2018-05-22 DIAGNOSIS — N186 End stage renal disease: Secondary | ICD-10-CM | POA: Diagnosis not present

## 2018-05-22 DIAGNOSIS — E119 Type 2 diabetes mellitus without complications: Secondary | ICD-10-CM | POA: Diagnosis not present

## 2018-05-22 DIAGNOSIS — D509 Iron deficiency anemia, unspecified: Secondary | ICD-10-CM | POA: Diagnosis not present

## 2018-05-25 DIAGNOSIS — N2581 Secondary hyperparathyroidism of renal origin: Secondary | ICD-10-CM | POA: Diagnosis not present

## 2018-05-25 DIAGNOSIS — N186 End stage renal disease: Secondary | ICD-10-CM | POA: Diagnosis not present

## 2018-05-25 DIAGNOSIS — D509 Iron deficiency anemia, unspecified: Secondary | ICD-10-CM | POA: Diagnosis not present

## 2018-05-25 DIAGNOSIS — E119 Type 2 diabetes mellitus without complications: Secondary | ICD-10-CM | POA: Diagnosis not present

## 2018-05-26 ENCOUNTER — Encounter: Payer: Self-pay | Admitting: Internal Medicine

## 2018-05-26 ENCOUNTER — Ambulatory Visit (INDEPENDENT_AMBULATORY_CARE_PROVIDER_SITE_OTHER): Payer: Medicare Other | Admitting: Internal Medicine

## 2018-05-26 VITALS — BP 130/62 | HR 75 | Ht 66.0 in | Wt 177.0 lb

## 2018-05-26 DIAGNOSIS — I10 Essential (primary) hypertension: Secondary | ICD-10-CM

## 2018-05-26 DIAGNOSIS — I4901 Ventricular fibrillation: Secondary | ICD-10-CM | POA: Diagnosis not present

## 2018-05-26 DIAGNOSIS — I5022 Chronic systolic (congestive) heart failure: Secondary | ICD-10-CM

## 2018-05-26 DIAGNOSIS — Z95810 Presence of automatic (implantable) cardiac defibrillator: Secondary | ICD-10-CM | POA: Diagnosis not present

## 2018-05-26 NOTE — Progress Notes (Signed)
HPI [name] returns today for followup. He is a 52 yo man with VF cardiac arrest, s/p resuscitation. He developed ESRD and is on HD. He has class 2 CHF symptoms. He denies chest pain or sob. No syncope. No ICD shock. His HD is going well with no problems during dialysis. No Known Allergies   Current Outpatient Medications  Medication Sig Dispense Refill  . calcium acetate (PHOSLO) 667 MG capsule Take 1,334 mg by mouth 3 (three) times daily with meals. 8a, 12p, 5p    . gabapentin (NEURONTIN) 300 MG capsule Take 300 mg by mouth 2 (two) times daily.    Marland Kitchen gemfibrozil (LOPID) 600 MG tablet Take 600 mg by mouth 2 (two) times daily before a meal.    . insulin detemir (LEVEMIR) 100 UNIT/ML injection Inject 0.05 mLs (5 Units total) into the skin at bedtime. 10 mL 0  . metoprolol succinate (TOPROL-XL) 50 MG 24 hr tablet Take 50 mg by mouth daily. Take with or immediately following a meal.     . omeprazole (PRILOSEC) 20 MG capsule Take 20 mg by mouth every morning.     . risperiDONE (RISPERDAL) 0.25 MG tablet Take 0.25 mg by mouth at bedtime.    . traZODone (DESYREL) 100 MG tablet Take 100 mg by mouth at bedtime.     No current facility-administered medications for this visit.      Past Medical History:  Diagnosis Date  . Anemia   . Atrial fibrillation   . Cardiomyopathy ([place]) 2010   Unclear Etiology: Last Echo 06/2009: EF 40-45%, severe Lateral & apical Hypokinesis (? CAD) ; Grade 2 DDysfxn. Mild conc LVH.   Marland Kitchen Cellulitis   . Chronic kidney disease (CKD), stage V ([place])    Dialysis Tue, Thurs, Sat  . Dyslipidemia   . Enterobacter sepsis ([place])   . Hyperlipidemia   . Hypertension   . S/P ICD (internal cardiac defibrillator) procedure 2010   VT Arrest (in [place])  . Schizophrenia ([place])     ROS:   All systems reviewed and negative except as noted in the HPI.   Past Surgical History:  Procedure Laterality Date  . AV FISTULA PLACEMENT Right 02/10/2013   Procedure:  ARTERIOVENOUS (AV) FISTULA CREATION;  Surgeon: Rosetta Posner, MD;  Location: [facility] OR;  Service: Vascular;  Laterality: Right;  Right forearm radial/cephalic arterovenous fistula.   Marland Kitchen CARDIAC CATHETERIZATION  2010   [place]: In setting of VT arrest. Per brother's report, nonobstructive with no intervention  . CARDIAC DEFIBRILLATOR PLACEMENT  2010   [place]  . FISTULOGRAM Right 08/09/2013   Procedure: FISTULOGRAM;  Surgeon: Angelia Mould, MD;  Location: [facility] CATH LAB;  Service: Cardiovascular;  Laterality: Right;  . ICD GENERATOR CHANGEOUT N/A 12/05/2017   Procedure: ICD GENERATOR CHANGEOUT;  Surgeon: Evans Lance, MD;  Location: [facility] CV LAB;  Service: Cardiovascular;  Laterality: N/A;  . LIGATION OF COMPETING BRANCHES OF ARTERIOVENOUS FISTULA Right 08/13/2013   Procedure: LIGATION OF COMPETING BRANCHES X5 OF ARTERIOVENOUS FISTULA- RIGHT ARM;  Surgeon: Serafina Mitchell, MD;  Location: [facility] OR;  Service: Vascular;  Laterality: Right;     Family History  Problem Relation Age of Onset  . CAD Mother      Social History   Socioeconomic History  . Marital status: Single    Spouse name: Not on file  . Number of children: Not on file  . Years of education: Not on file  . Highest education level: Not  on file  Occupational History  . Not on file  Social Needs  . Financial resource strain: Not on file  . Food insecurity:    Worry: Not on file    Inability: Not on file  . Transportation needs:    Medical: Not on file    Non-medical: Not on file  Tobacco Use  . Smoking status: Former Smoker    Last attempt to quit: 10/28/2008    Years since quitting: 9.5  . Smokeless tobacco: Never Used  Substance and Sexual Activity  . Alcohol use: No  . Drug use: No  . Sexual activity: Not on file  Lifestyle  . Physical activity:    Days per week: Not on file    Minutes per session: Not on file  . Stress: Not on file  Relationships  . Social connections:    Talks on phone: Not on file      Gets together: Not on file    Attends religious service: Not on file    Active member of club or organization: Not on file    Attends meetings of clubs or organizations: Not on file    Relationship status: Not on file  . Intimate partner violence:    Fear of current or ex partner: Not on file    Emotionally abused: Not on file    Physically abused: Not on file    Forced sexual activity: Not on file  Other Topics Concern  . Not on file  Social History Narrative  . Not on file     BP 130/62   Pulse 75   Ht 5\' 6"  (1.676 m)   Wt 177 lb (80.3 kg)   BMI 28.57 kg/m   Physical Exam:  Well appearing 52 yo man, NAD HEENT: Unremarkable Neck:  6 cm JVD, no thyromegally Lymphatics:  No adenopathy Back:  No CVA tenderness Lungs:  Clear with no wheezes HEART:  Regular rate rhythm, no murmurs, no rubs, no clicks Abd:  soft, positive bowel sounds, no organomegally, no rebound, no guarding Ext:  2 plus pulses, no edema, no cyanosis, no clubbing Skin:  No rashes no nodules Neuro:  CN II through XII intact, motor grossly intact  EKG - nsr with RBBB  DEVICE  Normal device function.  See PaceArt for details.   Assess/Plan: 1. VF - he has had no recurrent ventricular arrhythmias. He will continue his current meds. 2. ICD - his medtronic device has some elevated pacing impedence bipolar and he has been reprogrammed tip to coil. Also his sensing is down a bit but appears to be normal in terms of no evidence of undersensing. 3. ESRD - he appears to be tolerating his HD nicely.  James Nicholson.D.

## 2018-05-26 NOTE — Patient Instructions (Addendum)
Medication Instructions:  Your physician recommends that you continue on your current medications as directed. Please refer to the Current Medication list given to you today.  Labwork: None ordered.  Testing/Procedures: None ordered.  Follow-Up: Your physician wants you to follow-up in: 6 months with Dr. Lovena Le.   You will receive a reminder letter in the mail two months in advance. If you don't receive a letter, please call our office to schedule the follow-up appointment.  Remote monitoring is used to monitor your ICD from home. This monitoring reduces the number of office visits required to check your device to one time per year. It allows [place] to keep an eye on the functioning of your device to ensure it is working properly. You are scheduled for a device check from home on 08/25/2018. You may send your transmission at any time that day. If you have a wireless device, the transmission will be sent automatically. After your physician reviews your transmission, you will receive a postcard with your next transmission date.  Any Other Special Instructions Will Be Listed Below (If Applicable).  If you need a refill on your cardiac medications before your next appointment, please call your pharmacy.

## 2018-05-27 DIAGNOSIS — D509 Iron deficiency anemia, unspecified: Secondary | ICD-10-CM | POA: Diagnosis not present

## 2018-05-27 DIAGNOSIS — N2581 Secondary hyperparathyroidism of renal origin: Secondary | ICD-10-CM | POA: Diagnosis not present

## 2018-05-27 DIAGNOSIS — N186 End stage renal disease: Secondary | ICD-10-CM | POA: Diagnosis not present

## 2018-05-27 DIAGNOSIS — E119 Type 2 diabetes mellitus without complications: Secondary | ICD-10-CM | POA: Diagnosis not present

## 2018-05-28 DIAGNOSIS — N186 End stage renal disease: Secondary | ICD-10-CM | POA: Diagnosis not present

## 2018-05-28 DIAGNOSIS — N032 Chronic nephritic syndrome with diffuse membranous glomerulonephritis: Secondary | ICD-10-CM | POA: Diagnosis not present

## 2018-05-28 DIAGNOSIS — Z992 Dependence on renal dialysis: Secondary | ICD-10-CM | POA: Diagnosis not present

## 2018-05-29 DIAGNOSIS — N2581 Secondary hyperparathyroidism of renal origin: Secondary | ICD-10-CM | POA: Diagnosis not present

## 2018-05-29 DIAGNOSIS — E119 Type 2 diabetes mellitus without complications: Secondary | ICD-10-CM | POA: Diagnosis not present

## 2018-05-29 DIAGNOSIS — D631 Anemia in chronic kidney disease: Secondary | ICD-10-CM | POA: Diagnosis not present

## 2018-05-29 DIAGNOSIS — D509 Iron deficiency anemia, unspecified: Secondary | ICD-10-CM | POA: Diagnosis not present

## 2018-05-29 DIAGNOSIS — N186 End stage renal disease: Secondary | ICD-10-CM | POA: Diagnosis not present

## 2018-06-01 DIAGNOSIS — N2581 Secondary hyperparathyroidism of renal origin: Secondary | ICD-10-CM | POA: Diagnosis not present

## 2018-06-01 DIAGNOSIS — D509 Iron deficiency anemia, unspecified: Secondary | ICD-10-CM | POA: Diagnosis not present

## 2018-06-01 DIAGNOSIS — D631 Anemia in chronic kidney disease: Secondary | ICD-10-CM | POA: Diagnosis not present

## 2018-06-01 DIAGNOSIS — N186 End stage renal disease: Secondary | ICD-10-CM | POA: Diagnosis not present

## 2018-06-01 DIAGNOSIS — E119 Type 2 diabetes mellitus without complications: Secondary | ICD-10-CM | POA: Diagnosis not present

## 2018-06-03 DIAGNOSIS — D509 Iron deficiency anemia, unspecified: Secondary | ICD-10-CM | POA: Diagnosis not present

## 2018-06-03 DIAGNOSIS — N186 End stage renal disease: Secondary | ICD-10-CM | POA: Diagnosis not present

## 2018-06-03 DIAGNOSIS — N2581 Secondary hyperparathyroidism of renal origin: Secondary | ICD-10-CM | POA: Diagnosis not present

## 2018-06-03 DIAGNOSIS — E119 Type 2 diabetes mellitus without complications: Secondary | ICD-10-CM | POA: Diagnosis not present

## 2018-06-03 DIAGNOSIS — D631 Anemia in chronic kidney disease: Secondary | ICD-10-CM | POA: Diagnosis not present

## 2018-06-05 DIAGNOSIS — E119 Type 2 diabetes mellitus without complications: Secondary | ICD-10-CM | POA: Diagnosis not present

## 2018-06-05 DIAGNOSIS — N2581 Secondary hyperparathyroidism of renal origin: Secondary | ICD-10-CM | POA: Diagnosis not present

## 2018-06-05 DIAGNOSIS — N186 End stage renal disease: Secondary | ICD-10-CM | POA: Diagnosis not present

## 2018-06-05 DIAGNOSIS — D509 Iron deficiency anemia, unspecified: Secondary | ICD-10-CM | POA: Diagnosis not present

## 2018-06-05 DIAGNOSIS — D631 Anemia in chronic kidney disease: Secondary | ICD-10-CM | POA: Diagnosis not present

## 2018-06-08 DIAGNOSIS — E119 Type 2 diabetes mellitus without complications: Secondary | ICD-10-CM | POA: Diagnosis not present

## 2018-06-08 DIAGNOSIS — N2581 Secondary hyperparathyroidism of renal origin: Secondary | ICD-10-CM | POA: Diagnosis not present

## 2018-06-08 DIAGNOSIS — D509 Iron deficiency anemia, unspecified: Secondary | ICD-10-CM | POA: Diagnosis not present

## 2018-06-08 DIAGNOSIS — N186 End stage renal disease: Secondary | ICD-10-CM | POA: Diagnosis not present

## 2018-06-08 DIAGNOSIS — D631 Anemia in chronic kidney disease: Secondary | ICD-10-CM | POA: Diagnosis not present

## 2018-06-10 DIAGNOSIS — N186 End stage renal disease: Secondary | ICD-10-CM | POA: Diagnosis not present

## 2018-06-10 DIAGNOSIS — E119 Type 2 diabetes mellitus without complications: Secondary | ICD-10-CM | POA: Diagnosis not present

## 2018-06-10 DIAGNOSIS — D631 Anemia in chronic kidney disease: Secondary | ICD-10-CM | POA: Diagnosis not present

## 2018-06-10 DIAGNOSIS — N2581 Secondary hyperparathyroidism of renal origin: Secondary | ICD-10-CM | POA: Diagnosis not present

## 2018-06-10 DIAGNOSIS — D509 Iron deficiency anemia, unspecified: Secondary | ICD-10-CM | POA: Diagnosis not present

## 2018-06-12 DIAGNOSIS — E119 Type 2 diabetes mellitus without complications: Secondary | ICD-10-CM | POA: Diagnosis not present

## 2018-06-12 DIAGNOSIS — D631 Anemia in chronic kidney disease: Secondary | ICD-10-CM | POA: Diagnosis not present

## 2018-06-12 DIAGNOSIS — N186 End stage renal disease: Secondary | ICD-10-CM | POA: Diagnosis not present

## 2018-06-12 DIAGNOSIS — D509 Iron deficiency anemia, unspecified: Secondary | ICD-10-CM | POA: Diagnosis not present

## 2018-06-12 DIAGNOSIS — N2581 Secondary hyperparathyroidism of renal origin: Secondary | ICD-10-CM | POA: Diagnosis not present

## 2018-06-12 LAB — CUP PACEART INCLINIC DEVICE CHECK
Battery Remaining Longevity: 130 mo
Battery Voltage: 2.99 V
Brady Statistic RV Percent Paced: 0.01 %
Date Time Interrogation Session: 20190730210527
HighPow Impedance: 53 Ohm
HighPow Impedance: 73 Ohm
Implantable Lead Implant Date: 20100415
Implantable Lead Location: 753860
Implantable Lead Model: 6947
Implantable Pulse Generator Implant Date: 20190208
Lead Channel Impedance Value: 1539 Ohm
Lead Channel Impedance Value: 589 Ohm
Lead Channel Pacing Threshold Amplitude: 2.5 V
Lead Channel Pacing Threshold Pulse Width: 0.8 ms
Lead Channel Sensing Intrinsic Amplitude: 3.125 mV
Lead Channel Sensing Intrinsic Amplitude: 3.375 mV
Lead Channel Setting Pacing Amplitude: 3.5 V
Lead Channel Setting Pacing Pulse Width: 0.8 ms
Lead Channel Setting Sensing Sensitivity: 0.3 mV

## 2018-06-15 DIAGNOSIS — D509 Iron deficiency anemia, unspecified: Secondary | ICD-10-CM | POA: Diagnosis not present

## 2018-06-15 DIAGNOSIS — N186 End stage renal disease: Secondary | ICD-10-CM | POA: Diagnosis not present

## 2018-06-15 DIAGNOSIS — D631 Anemia in chronic kidney disease: Secondary | ICD-10-CM | POA: Diagnosis not present

## 2018-06-15 DIAGNOSIS — E119 Type 2 diabetes mellitus without complications: Secondary | ICD-10-CM | POA: Diagnosis not present

## 2018-06-15 DIAGNOSIS — N2581 Secondary hyperparathyroidism of renal origin: Secondary | ICD-10-CM | POA: Diagnosis not present

## 2018-06-17 DIAGNOSIS — E119 Type 2 diabetes mellitus without complications: Secondary | ICD-10-CM | POA: Diagnosis not present

## 2018-06-17 DIAGNOSIS — D631 Anemia in chronic kidney disease: Secondary | ICD-10-CM | POA: Diagnosis not present

## 2018-06-17 DIAGNOSIS — N186 End stage renal disease: Secondary | ICD-10-CM | POA: Diagnosis not present

## 2018-06-17 DIAGNOSIS — D509 Iron deficiency anemia, unspecified: Secondary | ICD-10-CM | POA: Diagnosis not present

## 2018-06-17 DIAGNOSIS — N2581 Secondary hyperparathyroidism of renal origin: Secondary | ICD-10-CM | POA: Diagnosis not present

## 2018-06-19 DIAGNOSIS — D509 Iron deficiency anemia, unspecified: Secondary | ICD-10-CM | POA: Diagnosis not present

## 2018-06-19 DIAGNOSIS — E119 Type 2 diabetes mellitus without complications: Secondary | ICD-10-CM | POA: Diagnosis not present

## 2018-06-19 DIAGNOSIS — D631 Anemia in chronic kidney disease: Secondary | ICD-10-CM | POA: Diagnosis not present

## 2018-06-19 DIAGNOSIS — N2581 Secondary hyperparathyroidism of renal origin: Secondary | ICD-10-CM | POA: Diagnosis not present

## 2018-06-19 DIAGNOSIS — N186 End stage renal disease: Secondary | ICD-10-CM | POA: Diagnosis not present

## 2018-06-22 DIAGNOSIS — N186 End stage renal disease: Secondary | ICD-10-CM | POA: Diagnosis not present

## 2018-06-22 DIAGNOSIS — E119 Type 2 diabetes mellitus without complications: Secondary | ICD-10-CM | POA: Diagnosis not present

## 2018-06-22 DIAGNOSIS — D509 Iron deficiency anemia, unspecified: Secondary | ICD-10-CM | POA: Diagnosis not present

## 2018-06-22 DIAGNOSIS — N2581 Secondary hyperparathyroidism of renal origin: Secondary | ICD-10-CM | POA: Diagnosis not present

## 2018-06-22 DIAGNOSIS — D631 Anemia in chronic kidney disease: Secondary | ICD-10-CM | POA: Diagnosis not present

## 2018-06-24 DIAGNOSIS — D509 Iron deficiency anemia, unspecified: Secondary | ICD-10-CM | POA: Diagnosis not present

## 2018-06-24 DIAGNOSIS — D631 Anemia in chronic kidney disease: Secondary | ICD-10-CM | POA: Diagnosis not present

## 2018-06-24 DIAGNOSIS — N2581 Secondary hyperparathyroidism of renal origin: Secondary | ICD-10-CM | POA: Diagnosis not present

## 2018-06-24 DIAGNOSIS — N186 End stage renal disease: Secondary | ICD-10-CM | POA: Diagnosis not present

## 2018-06-24 DIAGNOSIS — E119 Type 2 diabetes mellitus without complications: Secondary | ICD-10-CM | POA: Diagnosis not present

## 2018-06-26 DIAGNOSIS — N2581 Secondary hyperparathyroidism of renal origin: Secondary | ICD-10-CM | POA: Diagnosis not present

## 2018-06-26 DIAGNOSIS — D631 Anemia in chronic kidney disease: Secondary | ICD-10-CM | POA: Diagnosis not present

## 2018-06-26 DIAGNOSIS — N186 End stage renal disease: Secondary | ICD-10-CM | POA: Diagnosis not present

## 2018-06-26 DIAGNOSIS — E119 Type 2 diabetes mellitus without complications: Secondary | ICD-10-CM | POA: Diagnosis not present

## 2018-06-26 DIAGNOSIS — D509 Iron deficiency anemia, unspecified: Secondary | ICD-10-CM | POA: Diagnosis not present

## 2018-06-28 DIAGNOSIS — N032 Chronic nephritic syndrome with diffuse membranous glomerulonephritis: Secondary | ICD-10-CM | POA: Diagnosis not present

## 2018-06-28 DIAGNOSIS — Z992 Dependence on renal dialysis: Secondary | ICD-10-CM | POA: Diagnosis not present

## 2018-06-28 DIAGNOSIS — N186 End stage renal disease: Secondary | ICD-10-CM | POA: Diagnosis not present

## 2018-06-29 DIAGNOSIS — N2581 Secondary hyperparathyroidism of renal origin: Secondary | ICD-10-CM | POA: Diagnosis not present

## 2018-06-29 DIAGNOSIS — Z23 Encounter for immunization: Secondary | ICD-10-CM | POA: Diagnosis not present

## 2018-06-29 DIAGNOSIS — D509 Iron deficiency anemia, unspecified: Secondary | ICD-10-CM | POA: Diagnosis not present

## 2018-06-29 DIAGNOSIS — E119 Type 2 diabetes mellitus without complications: Secondary | ICD-10-CM | POA: Diagnosis not present

## 2018-06-29 DIAGNOSIS — D631 Anemia in chronic kidney disease: Secondary | ICD-10-CM | POA: Diagnosis not present

## 2018-06-29 DIAGNOSIS — N186 End stage renal disease: Secondary | ICD-10-CM | POA: Diagnosis not present

## 2018-07-01 DIAGNOSIS — N186 End stage renal disease: Secondary | ICD-10-CM | POA: Diagnosis not present

## 2018-07-01 DIAGNOSIS — N2581 Secondary hyperparathyroidism of renal origin: Secondary | ICD-10-CM | POA: Diagnosis not present

## 2018-07-01 DIAGNOSIS — Z23 Encounter for immunization: Secondary | ICD-10-CM | POA: Diagnosis not present

## 2018-07-01 DIAGNOSIS — D509 Iron deficiency anemia, unspecified: Secondary | ICD-10-CM | POA: Diagnosis not present

## 2018-07-01 DIAGNOSIS — E119 Type 2 diabetes mellitus without complications: Secondary | ICD-10-CM | POA: Diagnosis not present

## 2018-07-01 DIAGNOSIS — D631 Anemia in chronic kidney disease: Secondary | ICD-10-CM | POA: Diagnosis not present

## 2018-07-03 DIAGNOSIS — Z23 Encounter for immunization: Secondary | ICD-10-CM | POA: Diagnosis not present

## 2018-07-03 DIAGNOSIS — D631 Anemia in chronic kidney disease: Secondary | ICD-10-CM | POA: Diagnosis not present

## 2018-07-03 DIAGNOSIS — E119 Type 2 diabetes mellitus without complications: Secondary | ICD-10-CM | POA: Diagnosis not present

## 2018-07-03 DIAGNOSIS — N186 End stage renal disease: Secondary | ICD-10-CM | POA: Diagnosis not present

## 2018-07-03 DIAGNOSIS — D509 Iron deficiency anemia, unspecified: Secondary | ICD-10-CM | POA: Diagnosis not present

## 2018-07-03 DIAGNOSIS — N2581 Secondary hyperparathyroidism of renal origin: Secondary | ICD-10-CM | POA: Diagnosis not present

## 2018-07-06 DIAGNOSIS — D631 Anemia in chronic kidney disease: Secondary | ICD-10-CM | POA: Diagnosis not present

## 2018-07-06 DIAGNOSIS — N186 End stage renal disease: Secondary | ICD-10-CM | POA: Diagnosis not present

## 2018-07-06 DIAGNOSIS — N2581 Secondary hyperparathyroidism of renal origin: Secondary | ICD-10-CM | POA: Diagnosis not present

## 2018-07-06 DIAGNOSIS — E119 Type 2 diabetes mellitus without complications: Secondary | ICD-10-CM | POA: Diagnosis not present

## 2018-07-06 DIAGNOSIS — D509 Iron deficiency anemia, unspecified: Secondary | ICD-10-CM | POA: Diagnosis not present

## 2018-07-06 DIAGNOSIS — Z23 Encounter for immunization: Secondary | ICD-10-CM | POA: Diagnosis not present

## 2018-07-08 DIAGNOSIS — N2581 Secondary hyperparathyroidism of renal origin: Secondary | ICD-10-CM | POA: Diagnosis not present

## 2018-07-08 DIAGNOSIS — Z23 Encounter for immunization: Secondary | ICD-10-CM | POA: Diagnosis not present

## 2018-07-08 DIAGNOSIS — D509 Iron deficiency anemia, unspecified: Secondary | ICD-10-CM | POA: Diagnosis not present

## 2018-07-08 DIAGNOSIS — N186 End stage renal disease: Secondary | ICD-10-CM | POA: Diagnosis not present

## 2018-07-08 DIAGNOSIS — E119 Type 2 diabetes mellitus without complications: Secondary | ICD-10-CM | POA: Diagnosis not present

## 2018-07-08 DIAGNOSIS — D631 Anemia in chronic kidney disease: Secondary | ICD-10-CM | POA: Diagnosis not present

## 2018-07-10 DIAGNOSIS — D509 Iron deficiency anemia, unspecified: Secondary | ICD-10-CM | POA: Diagnosis not present

## 2018-07-10 DIAGNOSIS — N186 End stage renal disease: Secondary | ICD-10-CM | POA: Diagnosis not present

## 2018-07-10 DIAGNOSIS — Z23 Encounter for immunization: Secondary | ICD-10-CM | POA: Diagnosis not present

## 2018-07-10 DIAGNOSIS — E119 Type 2 diabetes mellitus without complications: Secondary | ICD-10-CM | POA: Diagnosis not present

## 2018-07-10 DIAGNOSIS — D631 Anemia in chronic kidney disease: Secondary | ICD-10-CM | POA: Diagnosis not present

## 2018-07-10 DIAGNOSIS — N2581 Secondary hyperparathyroidism of renal origin: Secondary | ICD-10-CM | POA: Diagnosis not present

## 2018-07-13 DIAGNOSIS — D509 Iron deficiency anemia, unspecified: Secondary | ICD-10-CM | POA: Diagnosis not present

## 2018-07-13 DIAGNOSIS — N2581 Secondary hyperparathyroidism of renal origin: Secondary | ICD-10-CM | POA: Diagnosis not present

## 2018-07-13 DIAGNOSIS — N186 End stage renal disease: Secondary | ICD-10-CM | POA: Diagnosis not present

## 2018-07-13 DIAGNOSIS — Z23 Encounter for immunization: Secondary | ICD-10-CM | POA: Diagnosis not present

## 2018-07-13 DIAGNOSIS — D631 Anemia in chronic kidney disease: Secondary | ICD-10-CM | POA: Diagnosis not present

## 2018-07-13 DIAGNOSIS — E119 Type 2 diabetes mellitus without complications: Secondary | ICD-10-CM | POA: Diagnosis not present

## 2018-07-15 DIAGNOSIS — Z23 Encounter for immunization: Secondary | ICD-10-CM | POA: Diagnosis not present

## 2018-07-15 DIAGNOSIS — N186 End stage renal disease: Secondary | ICD-10-CM | POA: Diagnosis not present

## 2018-07-15 DIAGNOSIS — D509 Iron deficiency anemia, unspecified: Secondary | ICD-10-CM | POA: Diagnosis not present

## 2018-07-15 DIAGNOSIS — D631 Anemia in chronic kidney disease: Secondary | ICD-10-CM | POA: Diagnosis not present

## 2018-07-15 DIAGNOSIS — E119 Type 2 diabetes mellitus without complications: Secondary | ICD-10-CM | POA: Diagnosis not present

## 2018-07-15 DIAGNOSIS — N2581 Secondary hyperparathyroidism of renal origin: Secondary | ICD-10-CM | POA: Diagnosis not present

## 2018-07-17 DIAGNOSIS — Z23 Encounter for immunization: Secondary | ICD-10-CM | POA: Diagnosis not present

## 2018-07-17 DIAGNOSIS — D509 Iron deficiency anemia, unspecified: Secondary | ICD-10-CM | POA: Diagnosis not present

## 2018-07-17 DIAGNOSIS — N186 End stage renal disease: Secondary | ICD-10-CM | POA: Diagnosis not present

## 2018-07-17 DIAGNOSIS — N2581 Secondary hyperparathyroidism of renal origin: Secondary | ICD-10-CM | POA: Diagnosis not present

## 2018-07-17 DIAGNOSIS — E119 Type 2 diabetes mellitus without complications: Secondary | ICD-10-CM | POA: Diagnosis not present

## 2018-07-17 DIAGNOSIS — D631 Anemia in chronic kidney disease: Secondary | ICD-10-CM | POA: Diagnosis not present

## 2018-07-20 DIAGNOSIS — D631 Anemia in chronic kidney disease: Secondary | ICD-10-CM | POA: Diagnosis not present

## 2018-07-20 DIAGNOSIS — D509 Iron deficiency anemia, unspecified: Secondary | ICD-10-CM | POA: Diagnosis not present

## 2018-07-20 DIAGNOSIS — N186 End stage renal disease: Secondary | ICD-10-CM | POA: Diagnosis not present

## 2018-07-20 DIAGNOSIS — Z23 Encounter for immunization: Secondary | ICD-10-CM | POA: Diagnosis not present

## 2018-07-20 DIAGNOSIS — E119 Type 2 diabetes mellitus without complications: Secondary | ICD-10-CM | POA: Diagnosis not present

## 2018-07-20 DIAGNOSIS — N2581 Secondary hyperparathyroidism of renal origin: Secondary | ICD-10-CM | POA: Diagnosis not present

## 2018-07-22 DIAGNOSIS — N2581 Secondary hyperparathyroidism of renal origin: Secondary | ICD-10-CM | POA: Diagnosis not present

## 2018-07-22 DIAGNOSIS — Z23 Encounter for immunization: Secondary | ICD-10-CM | POA: Diagnosis not present

## 2018-07-22 DIAGNOSIS — D509 Iron deficiency anemia, unspecified: Secondary | ICD-10-CM | POA: Diagnosis not present

## 2018-07-22 DIAGNOSIS — D631 Anemia in chronic kidney disease: Secondary | ICD-10-CM | POA: Diagnosis not present

## 2018-07-22 DIAGNOSIS — N186 End stage renal disease: Secondary | ICD-10-CM | POA: Diagnosis not present

## 2018-07-22 DIAGNOSIS — E119 Type 2 diabetes mellitus without complications: Secondary | ICD-10-CM | POA: Diagnosis not present

## 2018-07-24 DIAGNOSIS — D631 Anemia in chronic kidney disease: Secondary | ICD-10-CM | POA: Diagnosis not present

## 2018-07-24 DIAGNOSIS — Z23 Encounter for immunization: Secondary | ICD-10-CM | POA: Diagnosis not present

## 2018-07-24 DIAGNOSIS — N2581 Secondary hyperparathyroidism of renal origin: Secondary | ICD-10-CM | POA: Diagnosis not present

## 2018-07-24 DIAGNOSIS — E119 Type 2 diabetes mellitus without complications: Secondary | ICD-10-CM | POA: Diagnosis not present

## 2018-07-24 DIAGNOSIS — N186 End stage renal disease: Secondary | ICD-10-CM | POA: Diagnosis not present

## 2018-07-24 DIAGNOSIS — D509 Iron deficiency anemia, unspecified: Secondary | ICD-10-CM | POA: Diagnosis not present

## 2018-07-27 DIAGNOSIS — D509 Iron deficiency anemia, unspecified: Secondary | ICD-10-CM | POA: Diagnosis not present

## 2018-07-27 DIAGNOSIS — E119 Type 2 diabetes mellitus without complications: Secondary | ICD-10-CM | POA: Diagnosis not present

## 2018-07-27 DIAGNOSIS — D631 Anemia in chronic kidney disease: Secondary | ICD-10-CM | POA: Diagnosis not present

## 2018-07-27 DIAGNOSIS — Z23 Encounter for immunization: Secondary | ICD-10-CM | POA: Diagnosis not present

## 2018-07-27 DIAGNOSIS — N2581 Secondary hyperparathyroidism of renal origin: Secondary | ICD-10-CM | POA: Diagnosis not present

## 2018-07-27 DIAGNOSIS — N186 End stage renal disease: Secondary | ICD-10-CM | POA: Diagnosis not present

## 2018-07-28 DIAGNOSIS — Z992 Dependence on renal dialysis: Secondary | ICD-10-CM | POA: Diagnosis not present

## 2018-07-28 DIAGNOSIS — N032 Chronic nephritic syndrome with diffuse membranous glomerulonephritis: Secondary | ICD-10-CM | POA: Diagnosis not present

## 2018-07-28 DIAGNOSIS — N186 End stage renal disease: Secondary | ICD-10-CM | POA: Diagnosis not present

## 2018-07-29 DIAGNOSIS — N2581 Secondary hyperparathyroidism of renal origin: Secondary | ICD-10-CM | POA: Diagnosis not present

## 2018-07-29 DIAGNOSIS — N186 End stage renal disease: Secondary | ICD-10-CM | POA: Diagnosis not present

## 2018-07-29 DIAGNOSIS — E119 Type 2 diabetes mellitus without complications: Secondary | ICD-10-CM | POA: Diagnosis not present

## 2018-07-29 DIAGNOSIS — D509 Iron deficiency anemia, unspecified: Secondary | ICD-10-CM | POA: Diagnosis not present

## 2018-07-29 DIAGNOSIS — D631 Anemia in chronic kidney disease: Secondary | ICD-10-CM | POA: Diagnosis not present

## 2018-07-31 DIAGNOSIS — D631 Anemia in chronic kidney disease: Secondary | ICD-10-CM | POA: Diagnosis not present

## 2018-07-31 DIAGNOSIS — E119 Type 2 diabetes mellitus without complications: Secondary | ICD-10-CM | POA: Diagnosis not present

## 2018-07-31 DIAGNOSIS — N2581 Secondary hyperparathyroidism of renal origin: Secondary | ICD-10-CM | POA: Diagnosis not present

## 2018-07-31 DIAGNOSIS — D509 Iron deficiency anemia, unspecified: Secondary | ICD-10-CM | POA: Diagnosis not present

## 2018-07-31 DIAGNOSIS — N186 End stage renal disease: Secondary | ICD-10-CM | POA: Diagnosis not present

## 2018-08-03 DIAGNOSIS — E119 Type 2 diabetes mellitus without complications: Secondary | ICD-10-CM | POA: Diagnosis not present

## 2018-08-03 DIAGNOSIS — D631 Anemia in chronic kidney disease: Secondary | ICD-10-CM | POA: Diagnosis not present

## 2018-08-03 DIAGNOSIS — N2581 Secondary hyperparathyroidism of renal origin: Secondary | ICD-10-CM | POA: Diagnosis not present

## 2018-08-03 DIAGNOSIS — D509 Iron deficiency anemia, unspecified: Secondary | ICD-10-CM | POA: Diagnosis not present

## 2018-08-03 DIAGNOSIS — N186 End stage renal disease: Secondary | ICD-10-CM | POA: Diagnosis not present

## 2018-08-05 DIAGNOSIS — E119 Type 2 diabetes mellitus without complications: Secondary | ICD-10-CM | POA: Diagnosis not present

## 2018-08-05 DIAGNOSIS — N2581 Secondary hyperparathyroidism of renal origin: Secondary | ICD-10-CM | POA: Diagnosis not present

## 2018-08-05 DIAGNOSIS — D509 Iron deficiency anemia, unspecified: Secondary | ICD-10-CM | POA: Diagnosis not present

## 2018-08-05 DIAGNOSIS — E1129 Type 2 diabetes mellitus with other diabetic kidney complication: Secondary | ICD-10-CM | POA: Diagnosis not present

## 2018-08-05 DIAGNOSIS — D631 Anemia in chronic kidney disease: Secondary | ICD-10-CM | POA: Diagnosis not present

## 2018-08-05 DIAGNOSIS — N186 End stage renal disease: Secondary | ICD-10-CM | POA: Diagnosis not present

## 2018-08-07 DIAGNOSIS — N2581 Secondary hyperparathyroidism of renal origin: Secondary | ICD-10-CM | POA: Diagnosis not present

## 2018-08-07 DIAGNOSIS — D631 Anemia in chronic kidney disease: Secondary | ICD-10-CM | POA: Diagnosis not present

## 2018-08-07 DIAGNOSIS — N186 End stage renal disease: Secondary | ICD-10-CM | POA: Diagnosis not present

## 2018-08-07 DIAGNOSIS — D509 Iron deficiency anemia, unspecified: Secondary | ICD-10-CM | POA: Diagnosis not present

## 2018-08-07 DIAGNOSIS — E119 Type 2 diabetes mellitus without complications: Secondary | ICD-10-CM | POA: Diagnosis not present

## 2018-08-10 DIAGNOSIS — N2581 Secondary hyperparathyroidism of renal origin: Secondary | ICD-10-CM | POA: Diagnosis not present

## 2018-08-10 DIAGNOSIS — N186 End stage renal disease: Secondary | ICD-10-CM | POA: Diagnosis not present

## 2018-08-10 DIAGNOSIS — D509 Iron deficiency anemia, unspecified: Secondary | ICD-10-CM | POA: Diagnosis not present

## 2018-08-10 DIAGNOSIS — D631 Anemia in chronic kidney disease: Secondary | ICD-10-CM | POA: Diagnosis not present

## 2018-08-10 DIAGNOSIS — E119 Type 2 diabetes mellitus without complications: Secondary | ICD-10-CM | POA: Diagnosis not present

## 2018-08-12 DIAGNOSIS — D631 Anemia in chronic kidney disease: Secondary | ICD-10-CM | POA: Diagnosis not present

## 2018-08-12 DIAGNOSIS — N186 End stage renal disease: Secondary | ICD-10-CM | POA: Diagnosis not present

## 2018-08-12 DIAGNOSIS — N2581 Secondary hyperparathyroidism of renal origin: Secondary | ICD-10-CM | POA: Diagnosis not present

## 2018-08-12 DIAGNOSIS — D509 Iron deficiency anemia, unspecified: Secondary | ICD-10-CM | POA: Diagnosis not present

## 2018-08-12 DIAGNOSIS — E119 Type 2 diabetes mellitus without complications: Secondary | ICD-10-CM | POA: Diagnosis not present

## 2018-08-14 DIAGNOSIS — E119 Type 2 diabetes mellitus without complications: Secondary | ICD-10-CM | POA: Diagnosis not present

## 2018-08-14 DIAGNOSIS — D509 Iron deficiency anemia, unspecified: Secondary | ICD-10-CM | POA: Diagnosis not present

## 2018-08-14 DIAGNOSIS — N2581 Secondary hyperparathyroidism of renal origin: Secondary | ICD-10-CM | POA: Diagnosis not present

## 2018-08-14 DIAGNOSIS — N186 End stage renal disease: Secondary | ICD-10-CM | POA: Diagnosis not present

## 2018-08-14 DIAGNOSIS — D631 Anemia in chronic kidney disease: Secondary | ICD-10-CM | POA: Diagnosis not present

## 2018-08-17 DIAGNOSIS — N186 End stage renal disease: Secondary | ICD-10-CM | POA: Diagnosis not present

## 2018-08-17 DIAGNOSIS — D509 Iron deficiency anemia, unspecified: Secondary | ICD-10-CM | POA: Diagnosis not present

## 2018-08-17 DIAGNOSIS — E119 Type 2 diabetes mellitus without complications: Secondary | ICD-10-CM | POA: Diagnosis not present

## 2018-08-17 DIAGNOSIS — N2581 Secondary hyperparathyroidism of renal origin: Secondary | ICD-10-CM | POA: Diagnosis not present

## 2018-08-17 DIAGNOSIS — D631 Anemia in chronic kidney disease: Secondary | ICD-10-CM | POA: Diagnosis not present

## 2018-08-19 DIAGNOSIS — N186 End stage renal disease: Secondary | ICD-10-CM | POA: Diagnosis not present

## 2018-08-19 DIAGNOSIS — N2581 Secondary hyperparathyroidism of renal origin: Secondary | ICD-10-CM | POA: Diagnosis not present

## 2018-08-19 DIAGNOSIS — E119 Type 2 diabetes mellitus without complications: Secondary | ICD-10-CM | POA: Diagnosis not present

## 2018-08-19 DIAGNOSIS — D509 Iron deficiency anemia, unspecified: Secondary | ICD-10-CM | POA: Diagnosis not present

## 2018-08-19 DIAGNOSIS — D631 Anemia in chronic kidney disease: Secondary | ICD-10-CM | POA: Diagnosis not present

## 2018-08-21 DIAGNOSIS — D631 Anemia in chronic kidney disease: Secondary | ICD-10-CM | POA: Diagnosis not present

## 2018-08-21 DIAGNOSIS — N186 End stage renal disease: Secondary | ICD-10-CM | POA: Diagnosis not present

## 2018-08-21 DIAGNOSIS — N2581 Secondary hyperparathyroidism of renal origin: Secondary | ICD-10-CM | POA: Diagnosis not present

## 2018-08-21 DIAGNOSIS — E119 Type 2 diabetes mellitus without complications: Secondary | ICD-10-CM | POA: Diagnosis not present

## 2018-08-21 DIAGNOSIS — D509 Iron deficiency anemia, unspecified: Secondary | ICD-10-CM | POA: Diagnosis not present

## 2018-08-24 DIAGNOSIS — D631 Anemia in chronic kidney disease: Secondary | ICD-10-CM | POA: Diagnosis not present

## 2018-08-24 DIAGNOSIS — N2581 Secondary hyperparathyroidism of renal origin: Secondary | ICD-10-CM | POA: Diagnosis not present

## 2018-08-24 DIAGNOSIS — D509 Iron deficiency anemia, unspecified: Secondary | ICD-10-CM | POA: Diagnosis not present

## 2018-08-24 DIAGNOSIS — E119 Type 2 diabetes mellitus without complications: Secondary | ICD-10-CM | POA: Diagnosis not present

## 2018-08-24 DIAGNOSIS — N186 End stage renal disease: Secondary | ICD-10-CM | POA: Diagnosis not present

## 2018-08-25 ENCOUNTER — Ambulatory Visit (INDEPENDENT_AMBULATORY_CARE_PROVIDER_SITE_OTHER): Payer: Medicare Other | Admitting: *Deleted

## 2018-08-25 DIAGNOSIS — I4901 Ventricular fibrillation: Secondary | ICD-10-CM

## 2018-08-25 NOTE — Progress Notes (Signed)
Remote ICD transmission.   

## 2018-08-26 DIAGNOSIS — D631 Anemia in chronic kidney disease: Secondary | ICD-10-CM | POA: Diagnosis not present

## 2018-08-26 DIAGNOSIS — E119 Type 2 diabetes mellitus without complications: Secondary | ICD-10-CM | POA: Diagnosis not present

## 2018-08-26 DIAGNOSIS — D509 Iron deficiency anemia, unspecified: Secondary | ICD-10-CM | POA: Diagnosis not present

## 2018-08-26 DIAGNOSIS — N186 End stage renal disease: Secondary | ICD-10-CM | POA: Diagnosis not present

## 2018-08-26 DIAGNOSIS — N2581 Secondary hyperparathyroidism of renal origin: Secondary | ICD-10-CM | POA: Diagnosis not present

## 2018-08-28 DIAGNOSIS — D631 Anemia in chronic kidney disease: Secondary | ICD-10-CM | POA: Diagnosis not present

## 2018-08-28 DIAGNOSIS — D509 Iron deficiency anemia, unspecified: Secondary | ICD-10-CM | POA: Diagnosis not present

## 2018-08-28 DIAGNOSIS — N186 End stage renal disease: Secondary | ICD-10-CM | POA: Diagnosis not present

## 2018-08-28 DIAGNOSIS — E119 Type 2 diabetes mellitus without complications: Secondary | ICD-10-CM | POA: Diagnosis not present

## 2018-08-28 DIAGNOSIS — N032 Chronic nephritic syndrome with diffuse membranous glomerulonephritis: Secondary | ICD-10-CM | POA: Diagnosis not present

## 2018-08-28 DIAGNOSIS — N2581 Secondary hyperparathyroidism of renal origin: Secondary | ICD-10-CM | POA: Diagnosis not present

## 2018-08-28 DIAGNOSIS — Z992 Dependence on renal dialysis: Secondary | ICD-10-CM | POA: Diagnosis not present

## 2018-08-31 DIAGNOSIS — E119 Type 2 diabetes mellitus without complications: Secondary | ICD-10-CM | POA: Diagnosis not present

## 2018-08-31 DIAGNOSIS — D631 Anemia in chronic kidney disease: Secondary | ICD-10-CM | POA: Diagnosis not present

## 2018-08-31 DIAGNOSIS — D509 Iron deficiency anemia, unspecified: Secondary | ICD-10-CM | POA: Diagnosis not present

## 2018-08-31 DIAGNOSIS — N186 End stage renal disease: Secondary | ICD-10-CM | POA: Diagnosis not present

## 2018-08-31 DIAGNOSIS — N2581 Secondary hyperparathyroidism of renal origin: Secondary | ICD-10-CM | POA: Diagnosis not present

## 2018-09-02 DIAGNOSIS — N186 End stage renal disease: Secondary | ICD-10-CM | POA: Diagnosis not present

## 2018-09-02 DIAGNOSIS — E119 Type 2 diabetes mellitus without complications: Secondary | ICD-10-CM | POA: Diagnosis not present

## 2018-09-02 DIAGNOSIS — N2581 Secondary hyperparathyroidism of renal origin: Secondary | ICD-10-CM | POA: Diagnosis not present

## 2018-09-02 DIAGNOSIS — D631 Anemia in chronic kidney disease: Secondary | ICD-10-CM | POA: Diagnosis not present

## 2018-09-02 DIAGNOSIS — D509 Iron deficiency anemia, unspecified: Secondary | ICD-10-CM | POA: Diagnosis not present

## 2018-09-04 DIAGNOSIS — D509 Iron deficiency anemia, unspecified: Secondary | ICD-10-CM | POA: Diagnosis not present

## 2018-09-04 DIAGNOSIS — N186 End stage renal disease: Secondary | ICD-10-CM | POA: Diagnosis not present

## 2018-09-04 DIAGNOSIS — E119 Type 2 diabetes mellitus without complications: Secondary | ICD-10-CM | POA: Diagnosis not present

## 2018-09-04 DIAGNOSIS — D631 Anemia in chronic kidney disease: Secondary | ICD-10-CM | POA: Diagnosis not present

## 2018-09-04 DIAGNOSIS — N2581 Secondary hyperparathyroidism of renal origin: Secondary | ICD-10-CM | POA: Diagnosis not present

## 2018-09-07 DIAGNOSIS — D631 Anemia in chronic kidney disease: Secondary | ICD-10-CM | POA: Diagnosis not present

## 2018-09-07 DIAGNOSIS — N2581 Secondary hyperparathyroidism of renal origin: Secondary | ICD-10-CM | POA: Diagnosis not present

## 2018-09-07 DIAGNOSIS — N186 End stage renal disease: Secondary | ICD-10-CM | POA: Diagnosis not present

## 2018-09-07 DIAGNOSIS — E119 Type 2 diabetes mellitus without complications: Secondary | ICD-10-CM | POA: Diagnosis not present

## 2018-09-07 DIAGNOSIS — D509 Iron deficiency anemia, unspecified: Secondary | ICD-10-CM | POA: Diagnosis not present

## 2018-09-09 DIAGNOSIS — N2581 Secondary hyperparathyroidism of renal origin: Secondary | ICD-10-CM | POA: Diagnosis not present

## 2018-09-09 DIAGNOSIS — D631 Anemia in chronic kidney disease: Secondary | ICD-10-CM | POA: Diagnosis not present

## 2018-09-09 DIAGNOSIS — N186 End stage renal disease: Secondary | ICD-10-CM | POA: Diagnosis not present

## 2018-09-09 DIAGNOSIS — E119 Type 2 diabetes mellitus without complications: Secondary | ICD-10-CM | POA: Diagnosis not present

## 2018-09-09 DIAGNOSIS — D509 Iron deficiency anemia, unspecified: Secondary | ICD-10-CM | POA: Diagnosis not present

## 2018-09-11 DIAGNOSIS — N186 End stage renal disease: Secondary | ICD-10-CM | POA: Diagnosis not present

## 2018-09-11 DIAGNOSIS — E119 Type 2 diabetes mellitus without complications: Secondary | ICD-10-CM | POA: Diagnosis not present

## 2018-09-11 DIAGNOSIS — D509 Iron deficiency anemia, unspecified: Secondary | ICD-10-CM | POA: Diagnosis not present

## 2018-09-11 DIAGNOSIS — D631 Anemia in chronic kidney disease: Secondary | ICD-10-CM | POA: Diagnosis not present

## 2018-09-11 DIAGNOSIS — N2581 Secondary hyperparathyroidism of renal origin: Secondary | ICD-10-CM | POA: Diagnosis not present

## 2018-09-14 DIAGNOSIS — N186 End stage renal disease: Secondary | ICD-10-CM | POA: Diagnosis not present

## 2018-09-14 DIAGNOSIS — E119 Type 2 diabetes mellitus without complications: Secondary | ICD-10-CM | POA: Diagnosis not present

## 2018-09-14 DIAGNOSIS — D631 Anemia in chronic kidney disease: Secondary | ICD-10-CM | POA: Diagnosis not present

## 2018-09-14 DIAGNOSIS — D509 Iron deficiency anemia, unspecified: Secondary | ICD-10-CM | POA: Diagnosis not present

## 2018-09-14 DIAGNOSIS — N2581 Secondary hyperparathyroidism of renal origin: Secondary | ICD-10-CM | POA: Diagnosis not present

## 2018-09-16 DIAGNOSIS — D509 Iron deficiency anemia, unspecified: Secondary | ICD-10-CM | POA: Diagnosis not present

## 2018-09-16 DIAGNOSIS — E119 Type 2 diabetes mellitus without complications: Secondary | ICD-10-CM | POA: Diagnosis not present

## 2018-09-16 DIAGNOSIS — D631 Anemia in chronic kidney disease: Secondary | ICD-10-CM | POA: Diagnosis not present

## 2018-09-16 DIAGNOSIS — N186 End stage renal disease: Secondary | ICD-10-CM | POA: Diagnosis not present

## 2018-09-16 DIAGNOSIS — N2581 Secondary hyperparathyroidism of renal origin: Secondary | ICD-10-CM | POA: Diagnosis not present

## 2018-09-18 DIAGNOSIS — D509 Iron deficiency anemia, unspecified: Secondary | ICD-10-CM | POA: Diagnosis not present

## 2018-09-18 DIAGNOSIS — D631 Anemia in chronic kidney disease: Secondary | ICD-10-CM | POA: Diagnosis not present

## 2018-09-18 DIAGNOSIS — N2581 Secondary hyperparathyroidism of renal origin: Secondary | ICD-10-CM | POA: Diagnosis not present

## 2018-09-18 DIAGNOSIS — N186 End stage renal disease: Secondary | ICD-10-CM | POA: Diagnosis not present

## 2018-09-18 DIAGNOSIS — E119 Type 2 diabetes mellitus without complications: Secondary | ICD-10-CM | POA: Diagnosis not present

## 2018-09-20 DIAGNOSIS — D509 Iron deficiency anemia, unspecified: Secondary | ICD-10-CM | POA: Diagnosis not present

## 2018-09-20 DIAGNOSIS — N186 End stage renal disease: Secondary | ICD-10-CM | POA: Diagnosis not present

## 2018-09-20 DIAGNOSIS — D631 Anemia in chronic kidney disease: Secondary | ICD-10-CM | POA: Diagnosis not present

## 2018-09-20 DIAGNOSIS — E119 Type 2 diabetes mellitus without complications: Secondary | ICD-10-CM | POA: Diagnosis not present

## 2018-09-20 DIAGNOSIS — N2581 Secondary hyperparathyroidism of renal origin: Secondary | ICD-10-CM | POA: Diagnosis not present

## 2018-09-22 DIAGNOSIS — D631 Anemia in chronic kidney disease: Secondary | ICD-10-CM | POA: Diagnosis not present

## 2018-09-22 DIAGNOSIS — E119 Type 2 diabetes mellitus without complications: Secondary | ICD-10-CM | POA: Diagnosis not present

## 2018-09-22 DIAGNOSIS — N186 End stage renal disease: Secondary | ICD-10-CM | POA: Diagnosis not present

## 2018-09-22 DIAGNOSIS — N2581 Secondary hyperparathyroidism of renal origin: Secondary | ICD-10-CM | POA: Diagnosis not present

## 2018-09-22 DIAGNOSIS — D509 Iron deficiency anemia, unspecified: Secondary | ICD-10-CM | POA: Diagnosis not present

## 2018-09-25 DIAGNOSIS — D509 Iron deficiency anemia, unspecified: Secondary | ICD-10-CM | POA: Diagnosis not present

## 2018-09-25 DIAGNOSIS — N186 End stage renal disease: Secondary | ICD-10-CM | POA: Diagnosis not present

## 2018-09-25 DIAGNOSIS — E119 Type 2 diabetes mellitus without complications: Secondary | ICD-10-CM | POA: Diagnosis not present

## 2018-09-25 DIAGNOSIS — D631 Anemia in chronic kidney disease: Secondary | ICD-10-CM | POA: Diagnosis not present

## 2018-09-25 DIAGNOSIS — N2581 Secondary hyperparathyroidism of renal origin: Secondary | ICD-10-CM | POA: Diagnosis not present

## 2018-09-27 ENCOUNTER — Emergency Department (HOSPITAL_COMMUNITY): Payer: Medicare Other

## 2018-09-27 ENCOUNTER — Encounter (HOSPITAL_COMMUNITY): Payer: Self-pay | Admitting: Emergency Medicine

## 2018-09-27 ENCOUNTER — Inpatient Hospital Stay (HOSPITAL_COMMUNITY)
Admission: EM | Admit: 2018-09-27 | Discharge: 2018-10-01 | DRG: 193 | Disposition: A | Discharge status: Home / Self Care | Payer: Medicare Other | Attending: Internal Medicine | Admitting: Internal Medicine

## 2018-09-27 DIAGNOSIS — N2581 Secondary hyperparathyroidism of renal origin: Secondary | ICD-10-CM | POA: Diagnosis present

## 2018-09-27 DIAGNOSIS — J181 Lobar pneumonia, unspecified organism: Secondary | ICD-10-CM

## 2018-09-27 DIAGNOSIS — N032 Chronic nephritic syndrome with diffuse membranous glomerulonephritis: Secondary | ICD-10-CM | POA: Diagnosis not present

## 2018-09-27 DIAGNOSIS — Z8249 Family history of ischemic heart disease and other diseases of the circulatory system: Secondary | ICD-10-CM

## 2018-09-27 DIAGNOSIS — H1031 Unspecified acute conjunctivitis, right eye: Secondary | ICD-10-CM | POA: Diagnosis not present

## 2018-09-27 DIAGNOSIS — Z87891 Personal history of nicotine dependence: Secondary | ICD-10-CM

## 2018-09-27 DIAGNOSIS — Z992 Dependence on renal dialysis: Secondary | ICD-10-CM

## 2018-09-27 DIAGNOSIS — J189 Pneumonia, unspecified organism: Secondary | ICD-10-CM | POA: Diagnosis not present

## 2018-09-27 DIAGNOSIS — E119 Type 2 diabetes mellitus without complications: Secondary | ICD-10-CM

## 2018-09-27 DIAGNOSIS — I2510 Atherosclerotic heart disease of native coronary artery without angina pectoris: Secondary | ICD-10-CM | POA: Diagnosis present

## 2018-09-27 DIAGNOSIS — M3130 Wegener's granulomatosis without renal involvement: Secondary | ICD-10-CM | POA: Diagnosis present

## 2018-09-27 DIAGNOSIS — I428 Other cardiomyopathies: Secondary | ICD-10-CM | POA: Diagnosis not present

## 2018-09-27 DIAGNOSIS — R05 Cough: Secondary | ICD-10-CM | POA: Diagnosis not present

## 2018-09-27 DIAGNOSIS — R079 Chest pain, unspecified: Secondary | ICD-10-CM | POA: Diagnosis not present

## 2018-09-27 DIAGNOSIS — R0602 Shortness of breath: Secondary | ICD-10-CM | POA: Diagnosis not present

## 2018-09-27 DIAGNOSIS — F209 Schizophrenia, unspecified: Secondary | ICD-10-CM | POA: Diagnosis present

## 2018-09-27 DIAGNOSIS — IMO0001 Reserved for inherently not codable concepts without codable children: Secondary | ICD-10-CM

## 2018-09-27 DIAGNOSIS — E1122 Type 2 diabetes mellitus with diabetic chronic kidney disease: Secondary | ICD-10-CM | POA: Diagnosis present

## 2018-09-27 DIAGNOSIS — I5022 Chronic systolic (congestive) heart failure: Secondary | ICD-10-CM | POA: Diagnosis present

## 2018-09-27 DIAGNOSIS — I132 Hypertensive heart and chronic kidney disease with heart failure and with stage 5 chronic kidney disease, or end stage renal disease: Secondary | ICD-10-CM | POA: Diagnosis not present

## 2018-09-27 DIAGNOSIS — Z79899 Other long term (current) drug therapy: Secondary | ICD-10-CM

## 2018-09-27 DIAGNOSIS — D631 Anemia in chronic kidney disease: Secondary | ICD-10-CM | POA: Diagnosis present

## 2018-09-27 DIAGNOSIS — N189 Chronic kidney disease, unspecified: Secondary | ICD-10-CM

## 2018-09-27 DIAGNOSIS — Z79891 Long term (current) use of opiate analgesic: Secondary | ICD-10-CM

## 2018-09-27 DIAGNOSIS — Z794 Long term (current) use of insulin: Secondary | ICD-10-CM | POA: Diagnosis not present

## 2018-09-27 DIAGNOSIS — N186 End stage renal disease: Secondary | ICD-10-CM | POA: Diagnosis not present

## 2018-09-27 DIAGNOSIS — Z95810 Presence of automatic (implantable) cardiac defibrillator: Secondary | ICD-10-CM

## 2018-09-27 DIAGNOSIS — Z8674 Personal history of sudden cardiac arrest: Secondary | ICD-10-CM

## 2018-09-27 DIAGNOSIS — E785 Hyperlipidemia, unspecified: Secondary | ICD-10-CM | POA: Diagnosis present

## 2018-09-27 DIAGNOSIS — Y95 Nosocomial condition: Secondary | ICD-10-CM | POA: Diagnosis present

## 2018-09-27 DIAGNOSIS — H109 Unspecified conjunctivitis: Secondary | ICD-10-CM | POA: Diagnosis present

## 2018-09-27 HISTORY — DX: Long term (current) use of insulin: Z79.4

## 2018-09-27 HISTORY — DX: Type 2 diabetes mellitus without complications: E11.9

## 2018-09-27 HISTORY — DX: Wegener's granulomatosis without renal involvement: M31.30

## 2018-09-27 LAB — COMPREHENSIVE METABOLIC PANEL
ALT: 11 U/L (ref 0–44)
AST: 12 U/L — ABNORMAL LOW (ref 15–41)
Albumin: 3.2 g/dL — ABNORMAL LOW (ref 3.5–5.0)
Alkaline Phosphatase: 55 U/L (ref 38–126)
Anion gap: 17 — ABNORMAL HIGH (ref 5–15)
BUN: 54 mg/dL — ABNORMAL HIGH (ref 6–20)
CO2: 23 mmol/L (ref 22–32)
Calcium: 9.6 mg/dL (ref 8.9–10.3)
Chloride: 99 mmol/L (ref 98–111)
Creatinine, Ser: 10.18 mg/dL — ABNORMAL HIGH (ref 0.61–1.24)
GFR calc Af Amer: 6 mL/min — ABNORMAL LOW (ref >60)
GFR calc non Af Amer: 5 mL/min — ABNORMAL LOW (ref >60)
Glucose, Bld: 88 mg/dL (ref 70–99)
Potassium: 4.7 mmol/L (ref 3.5–5.1)
Sodium: 139 mmol/L (ref 135–145)
Total Bilirubin: 1.1 mg/dL (ref 0.3–1.2)
Total Protein: 7.8 g/dL (ref 6.5–8.1)

## 2018-09-27 LAB — CBC WITH DIFFERENTIAL/PLATELET
Abs Immature Granulocytes: 0.08 10*3/uL — ABNORMAL HIGH (ref 0.00–0.07)
Basophils Absolute: 0.1 10*3/uL (ref 0.0–0.1)
Basophils Relative: 0 %
Eosinophils Absolute: 0.9 10*3/uL — ABNORMAL HIGH (ref 0.0–0.5)
Eosinophils Relative: 7 %
HCT: 35.5 % — ABNORMAL LOW (ref 39.0–52.0)
Hemoglobin: 10.7 g/dL — ABNORMAL LOW (ref 13.0–17.0)
Immature Granulocytes: 1 %
Lymphocytes Relative: 13 %
Lymphs Abs: 1.5 10*3/uL (ref 0.7–4.0)
MCH: 30.2 pg (ref 26.0–34.0)
MCHC: 30.1 g/dL (ref 30.0–36.0)
MCV: 100.3 fL — ABNORMAL HIGH (ref 80.0–100.0)
Monocytes Absolute: 1.1 10*3/uL — ABNORMAL HIGH (ref 0.1–1.0)
Monocytes Relative: 9 %
Neutro Abs: 8.5 10*3/uL — ABNORMAL HIGH (ref 1.7–7.7)
Neutrophils Relative %: 70 %
Platelets: 240 10*3/uL (ref 150–400)
RBC: 3.54 MIL/uL — ABNORMAL LOW (ref 4.22–5.81)
RDW: 14.7 % (ref 11.5–15.5)
WBC: 12.1 10*3/uL — ABNORMAL HIGH (ref 4.0–10.5)
nRBC: 0 % (ref 0.0–0.2)

## 2018-09-27 LAB — INFLUENZA PANEL BY PCR (TYPE A & B)
Influenza A By PCR: NEGATIVE
Influenza B By PCR: NEGATIVE

## 2018-09-27 LAB — I-STAT TROPONIN, ED: Troponin i, poc: 0 ng/mL (ref 0.00–0.08)

## 2018-09-27 LAB — GLUCOSE, CAPILLARY: Glucose-Capillary: 138 mg/dL — ABNORMAL HIGH (ref 70–99)

## 2018-09-27 MED ORDER — TRAZODONE HCL 100 MG PO TABS
100.0000 mg | ORAL_TABLET | Freq: Every day | ORAL | Status: DC
  Administered 2018-09-27 – 2018-09-30 (×4): 100 mg via ORAL
  Filled 2018-09-27 (×4): qty 1

## 2018-09-27 MED ORDER — FLUORESCEIN SODIUM 1 MG OP STRP
1.0000 | ORAL_STRIP | Freq: Once | OPHTHALMIC | Status: AC
  Administered 2018-09-27: 1 via OPHTHALMIC
  Filled 2018-09-27: qty 1

## 2018-09-27 MED ORDER — CIPROFLOXACIN HCL 0.3 % OP SOLN
2.0000 [drp] | OPHTHALMIC | Status: DC
  Administered 2018-09-27 – 2018-10-01 (×18): 2 [drp] via OPHTHALMIC
  Filled 2018-09-27: qty 2.5

## 2018-09-27 MED ORDER — IPRATROPIUM-ALBUTEROL 0.5-2.5 (3) MG/3ML IN SOLN
3.0000 mL | Freq: Once | RESPIRATORY_TRACT | Status: AC
  Administered 2018-09-27: 3 mL via RESPIRATORY_TRACT
  Filled 2018-09-27: qty 3

## 2018-09-27 MED ORDER — ACETAMINOPHEN 325 MG PO TABS
650.0000 mg | ORAL_TABLET | Freq: Four times a day (QID) | ORAL | Status: DC | PRN
  Administered 2018-09-27 – 2018-10-01 (×4): 650 mg via ORAL
  Filled 2018-09-27 (×3): qty 2

## 2018-09-27 MED ORDER — RISPERIDONE 0.25 MG PO TABS
0.2500 mg | ORAL_TABLET | Freq: Every day | ORAL | Status: DC
  Administered 2018-09-27 – 2018-09-30 (×4): 0.25 mg via ORAL
  Filled 2018-09-27 (×6): qty 1

## 2018-09-27 MED ORDER — PANTOPRAZOLE SODIUM 40 MG PO TBEC
40.0000 mg | DELAYED_RELEASE_TABLET | Freq: Every day | ORAL | Status: DC
  Administered 2018-09-27 – 2018-10-01 (×5): 40 mg via ORAL
  Filled 2018-09-27 (×5): qty 1

## 2018-09-27 MED ORDER — ACETAMINOPHEN 650 MG RE SUPP: 650.0000 mg | Freq: Four times a day (QID) | RECTAL | Status: DC | PRN

## 2018-09-27 MED ORDER — CALCIUM ACETATE (PHOS BINDER) 667 MG PO CAPS
1334.0000 mg | ORAL_CAPSULE | Freq: Three times a day (TID) | ORAL | Status: DC
  Administered 2018-09-27 – 2018-10-01 (×10): 1334 mg via ORAL
  Filled 2018-09-27 (×10): qty 2

## 2018-09-27 MED ORDER — VANCOMYCIN HCL 10 G IV SOLR
1750.0000 mg | Freq: Once | INTRAVENOUS | Status: AC
  Administered 2018-09-27: 1750 mg via INTRAVENOUS
  Filled 2018-09-27: qty 1750

## 2018-09-27 MED ORDER — SODIUM CHLORIDE 0.9 % IV SOLN
1.0000 g | INTRAVENOUS | Status: DC
  Administered 2018-09-27 – 2018-09-30 (×4): 1 g via INTRAVENOUS
  Filled 2018-09-27 (×5): qty 10

## 2018-09-27 MED ORDER — VANCOMYCIN HCL IN DEXTROSE 750-5 MG/150ML-% IV SOLN: 750.0000 mg | INTRAVENOUS | Status: DC

## 2018-09-27 MED ORDER — GABAPENTIN 300 MG PO CAPS
300.0000 mg | ORAL_CAPSULE | Freq: Two times a day (BID) | ORAL | Status: DC
  Administered 2018-09-27 – 2018-10-01 (×8): 300 mg via ORAL
  Filled 2018-09-27 (×8): qty 1

## 2018-09-27 MED ORDER — SODIUM CHLORIDE 0.9 % IV SOLN
2.0000 g | Freq: Once | INTRAVENOUS | Status: AC
  Administered 2018-09-27: 2 g via INTRAVENOUS
  Filled 2018-09-27: qty 2

## 2018-09-27 MED ORDER — METOPROLOL SUCCINATE ER 50 MG PO TB24
50.0000 mg | ORAL_TABLET | Freq: Every day | ORAL | Status: DC
  Administered 2018-09-27 – 2018-10-01 (×5): 50 mg via ORAL
  Filled 2018-09-27 (×5): qty 1

## 2018-09-27 MED ORDER — ONDANSETRON HCL 4 MG PO TABS: 4.0000 mg | ORAL_TABLET | Freq: Four times a day (QID) | ORAL | Status: DC | PRN

## 2018-09-27 MED ORDER — TETRACAINE HCL 0.5 % OP SOLN
2.0000 [drp] | Freq: Once | OPHTHALMIC | Status: AC
  Administered 2018-09-27: 2 [drp] via OPHTHALMIC
  Filled 2018-09-27: qty 4

## 2018-09-27 MED ORDER — SODIUM CHLORIDE 0.9 % IV SOLN
1.0000 g | Freq: Once | INTRAVENOUS | Status: DC
  Filled 2018-09-27: qty 1

## 2018-09-27 MED ORDER — AZITHROMYCIN 250 MG PO TABS
500.0000 mg | ORAL_TABLET | ORAL | Status: DC
  Administered 2018-09-27 – 2018-09-30 (×4): 500 mg via ORAL
  Filled 2018-09-27 (×4): qty 2

## 2018-09-27 MED ORDER — SODIUM CHLORIDE 0.9% FLUSH
3.0000 mL | Freq: Two times a day (BID) | INTRAVENOUS | Status: DC
  Administered 2018-09-27 – 2018-10-01 (×7): 3 mL via INTRAVENOUS

## 2018-09-27 MED ORDER — INSULIN DETEMIR 100 UNIT/ML ~~LOC~~ SOLN
5.0000 [IU] | Freq: Every day | SUBCUTANEOUS | Status: DC
  Administered 2018-09-27 – 2018-09-30 (×4): 5 [IU] via SUBCUTANEOUS
  Filled 2018-09-27 (×7): qty 0.05

## 2018-09-27 MED ORDER — CALCIUM ACETATE 667 MG PO CAPS: 1334.0000 mg | ORAL_CAPSULE | Freq: Three times a day (TID) | ORAL | Status: DC

## 2018-09-27 MED ORDER — ONDANSETRON HCL 4 MG/2ML IJ SOLN: 4.0000 mg | Freq: Four times a day (QID) | INTRAMUSCULAR | Status: DC | PRN

## 2018-09-27 MED ORDER — SODIUM CHLORIDE 0.9 % IV SOLN: 250.0000 mL | INTRAVENOUS | Status: DC | PRN

## 2018-09-27 MED ORDER — VANCOMYCIN HCL 10 G IV SOLR
1500.0000 mg | Freq: Once | INTRAVENOUS | Status: DC
  Filled 2018-09-27: qty 1500

## 2018-09-27 MED ORDER — SODIUM CHLORIDE 0.9% FLUSH: 3.0000 mL | INTRAVENOUS | Status: DC | PRN

## 2018-09-27 NOTE — ED Triage Notes (Signed)
Pt reports cough, CP, SOB and bilateral leg swelling that may have started before Thanksgiving. Brother reports he is from a group home and the pt is quiet so he is unsure when the symptoms started.

## 2018-09-27 NOTE — ED Provider Notes (Signed)
She is obtained from patient, using his brother as interpreter.  Patient speaks no [place].  I offered a professional medical interpreter which patient declined complains of cough nonproductive shortness of breath onset 2 days ago.  No fever.  Also complains of pleuritic chest pain worse with coughing.  No treatment prior to coming here.  His brother also reports that his right eye has been reddened for approximately a month.  Patient has blurred vision from right eye but vision has been unchanged.  Has minimal right eye discomfort.  Last hemodialysis session was 2 days ago.  On exam patient is in no respiratory distress.  HEENT exam normocephalic atraumatic.  Right eye with some conjunctival erythema.  Pupils equal round react light extraocular muscles intact.  Neck supple.  Lungs scant diffuse rhonchi.  No use of accessory muscles heart regular rate and rhythm abdomen nondistended nontender extremities without edema.  Right upper extremity with dialysis fistula with good thrill.  All 4 extremities neurovascularly intact   Orlie Dakin, MD 09/27/18 [phone]

## 2018-09-27 NOTE — Progress Notes (Addendum)
Received patient from ED with N/V and significant increase in SOB.  Accompanied by brother.  Patient speaks only a few words of English.  Main language, according to brother, is "[place] / [place]", but he also states he speaks Editor, commissioning".  Brother insistent on not utilizing our portable interpreter and states his brother "doesn't trust" a stranger.  He then went on to say that he was overly tired after having spent all night with him and had to go home to sleep.  He was not helpful in providing much information and it is believed that once he leaves, the patient may be more favorable towards use of the interpreter.  Unable to obtain history from patient due to communication difficulties.

## 2018-09-27 NOTE — ED Notes (Signed)
Walked pt with pulse ox, oxygen dropped down to 95. Pt had steady gait and did not need assistance to walk. Pt did not seem in any obvious distress, but was a little short of breath.

## 2018-09-27 NOTE — ED Notes (Signed)
Patient transported to X-ray 

## 2018-09-27 NOTE — H&P (Signed)
History and Physical  James Nicholson NIO:[id number] DOB: 12-23-1965 DOA: 09/27/2018  PCP: [name], MD   Chief Complaint: short of breath  HPI:  52 year old man PMH Wegener's granulomatosis, atrial fibrillation, cardiomyopathy, ESRD, ICD, schizophrenia presented to the emergency department with shortness of breath, cough, vomiting.  Referred for observation for pneumonia.  History obtained from patient as well as brother at bedside who interprets my preference.  He has had shortness of breath for the last 2 weeks which has been progressive, with increased coughing.  Over the last 24 hours symptoms have worsened with significant coughing and shortness of breath and some chest discomfort from coughing.  He has had vomiting.  No specific aggravating or alleviating factors.  Brother observed him last night and noted him to be in some respiratory distress.  Also noted is a right red eye which has been been red for approximately a month, there is matting in the morning, patient reports intact vision.  With  ED Course: Afebrile, vital signs stable, no hypoxia, treated with DuoNeb, cefepime.  Review of Systems:  Negative for fever, visual changes, sore throat, dysuria, bleeding, abdominal pain, diarrhea  Positive for lesion of her left chest wall, positive for some bilateral leg tightness right, no groin pain  Past Medical History:  Diagnosis Date  . Anemia   . Atrial fibrillation   . Cardiomyopathy ([place]) 2010   Unclear Etiology: Last Echo 06/2009: EF 40-45%, severe Lateral & apical Hypokinesis (? CAD) ; Grade 2 DDysfxn. Mild conc LVH.   Marland Kitchen Cellulitis   . Chronic kidney disease (CKD), stage V ([place])    Dialysis Tue, Thurs, Sat  . Dyslipidemia   . Enterobacter sepsis ([place])   . Hyperlipidemia   . Hypertension   . IDDM (insulin dependent diabetes mellitus) ([place]) 11/03/2014  . S/P ICD (internal cardiac defibrillator) procedure 2010   VT Arrest (in [place])  . Schizophrenia ([place])   .  Wegener's disease, pulmonary ([place]) 02/05/2013    Past Surgical History:  Procedure Laterality Date  . AV FISTULA PLACEMENT Right 02/10/2013   Procedure: ARTERIOVENOUS (AV) FISTULA CREATION;  Surgeon: Rosetta Posner, MD;  Location: [facility] OR;  Service: Vascular;  Laterality: Right;  Right forearm radial/cephalic arterovenous fistula.   Marland Kitchen CARDIAC CATHETERIZATION  2010   [place]: In setting of VT arrest. Per brother's report, nonobstructive with no intervention  . CARDIAC DEFIBRILLATOR PLACEMENT  2010   [place]  . FISTULOGRAM Right 08/09/2013   Procedure: FISTULOGRAM;  Surgeon: Angelia Mould, MD;  Location: [facility] CATH LAB;  Service: Cardiovascular;  Laterality: Right;  . ICD GENERATOR CHANGEOUT N/A 12/05/2017   Procedure: ICD GENERATOR CHANGEOUT;  Surgeon: Evans Lance, MD;  Location: [facility] CV LAB;  Service: Cardiovascular;  Laterality: N/A;  . LIGATION OF COMPETING BRANCHES OF ARTERIOVENOUS FISTULA Right 08/13/2013   Procedure: LIGATION OF COMPETING BRANCHES X5 OF ARTERIOVENOUS FISTULA- RIGHT ARM;  Surgeon: Serafina Mitchell, MD;  Location: [facility];  Service: Vascular;  Laterality: Right;     reports that he quit smoking about 9 years ago. He has never used smokeless tobacco. He reports that he does not drink alcohol or use drugs. Mobility: Ambulatory  No Known Allergies  Family History  Problem Relation Age of Onset  . CAD Mother      Prior to Admission medications   Medication Sig Start Date End Date Taking? Authorizing Provider  calcium acetate (PHOSLO) 667 MG capsule Take 1,334 mg by mouth 3 (three) times daily with meals. 8a, 12p, 5p  02/11/13  Yes Regalado, Belkys A, MD  gabapentin (NEURONTIN) 300 MG capsule Take 300 mg by mouth 2 (two) times daily.   Yes [provider]  gemfibrozil (LOPID) 600 MG tablet Take 600 mg by mouth 2 (two) times daily before a meal.   Yes [provider]  insulin detemir (LEVEMIR) 100 UNIT/ML injection Inject 0.05 mLs (5 Units total)  into the skin at bedtime. 02/11/13  Yes Regalado, Belkys A, MD  metoprolol succinate (TOPROL-XL) 50 MG 24 hr tablet Take 50 mg by mouth daily. Take with or immediately following a meal.    Yes [provider]  omeprazole (PRILOSEC) 20 MG capsule Take 20 mg by mouth every morning.    Yes [provider]  risperiDONE (RISPERDAL) 0.25 MG tablet Take 0.25 mg by mouth at bedtime.   Yes [provider]  traZODone (DESYREL) 100 MG tablet Take 100 mg by mouth at bedtime.   Yes [provider]    Physical Exam: Vitals:   09/27/18 1040 09/27/18 1209  BP: 130/77 119/80  Pulse: 92 90  Resp: 18 17  Temp: 98.7 F (37.1 C)   SpO2: 97% 97%    Constitutional:   . Appears calm, mildly uncomfortable, nontoxic Eyes:  . pupils and irises appear normal . Right conjunctive is injected ENMT:  . grossly normal hearing  . Lips appear normal . Tongue appears unremarkable Neck:  . neck appears normal Respiratory:  . Some rales posteriorly some rhonchi noted on the right side . Respiratory effort mildly increased. Cardiovascular:  . RRR, no m/r/g . No LE extremity edema   Abdomen:  . Soft, nontender, nondistended, I do not see any hernias  GU: Penis and scrotum appear grossly unremarkable.  Groin appears grossly unremarkable Musculoskeletal:  . Digits/nails BUE: no clubbing, cyanosis, petechiae, infection . RUE, LUE, RLE, LLE   . Tone appears grossly normal Skin:  . There is healing abrasion left chest wall at the scar from the ICD placement, there is small abrasion in the groin as well . palpation of skin: no induration or nodules Neurologic:  . Grossly nonfocal Psychiatric:  . Mental status o Mood, affect appropriate . judgment and insight appear difficult to assess   I have personally reviewed following labs and imaging studies  Labs:   BMP consistent with end-stage renal disease, potassium 4.7, LFTs unremarkable, troponin negative, WBC 12.1,  hemoglobin 10.7, platelets within normal limits, influenza negative  Imaging studies:   Chest x-ray independently reviewed and there appears to be right base infiltrate and atelectasis.  The left lower lung field is difficult to characterize.  Radiology interpretation reviewed.    Medical tests:   EKG independently reviewed: EKG sinus rhythm with right bundle branch block, no acute changes compared to previous study    Principal Problem:   Pneumonia Active Problems:   Anemia in chronic kidney disease   Wegener's disease, pulmonary (HCC)   End stage renal disease (HCC)   IDDM (insulin dependent diabetes mellitus) (HCC)   Conjunctivitis   Assessment/Plan 52 year old man presents with pneumonia.  Although not hypoxic, he is mildly short of breath, with report of vomiting and compliance at the group home is questionable per his brother.  Will observe overnight.  Community-acquired pneumonia in a patient with Wegener's granulomatosis lung disease and end-stage renal disease with some vomiting at home --Empiric antibiotics  Right eye conjunctivitis, possibly bacterial --Empiric eyedrops  End-stage renal disease on hemodialysis Monday Wednesday Friday --Routine consultation for hemodialysis 12/2 unless the  patient is discharged in the morning  Wegener's granulomatosis lung disease --Appears stable  Anemia of chronic disease appears to be stable.  Atrial fibrillation, cardiomyopathy, ICD placement --Appears stable.  Continue metoprolol  Diabetes mellitus type 2 on insulin --Continue Levemir  Schizophrenia --Appears stable.  Continue Risperdal, trazodone  Severity of Illness: The appropriate patient status for this patient is OBSERVATION. Observation status is judged to be reasonable and necessary in order to provide the required intensity of service to ensure the patient's safety. The patient's presenting symptoms, physical exam findings, and initial radiographic and laboratory  data in the context of their medical condition is felt to place them at decreased risk for further clinical deterioration. Furthermore, it is anticipated that the patient will be medically stable for discharge from the hospital within 2 midnights of admission. The following factors support the patient status of observation.   " The patient's presenting symptoms include cough, shortness of breath, vomiting. " The physical exam findings include Rales and rhonchi on lung examination. " The initial radiographic and laboratory data are right base pneumonia Comorbidities include Wegener's granulomatosis, end-stage renal disease, schizophrenia  DVT prophylaxis:SCDs Code Status: Full Family Communication: brother at bedside Consults called: none    Time spent: 10 minutes  Murray Hodgkins, MD  [facility] Direct contact: [phone] --Via amion app OR  --[email or web address]; password [email or web address]  7PM-7AM contact night coverage as above  09/27/2018, 1:20 PM

## 2018-09-27 NOTE — ED Provider Notes (Signed)
[facility] EMERGENCY DEPARTMENT Provider Note   CSN: [id number] Arrival date & time: 09/27/18  0350     History   Chief Complaint Chief Complaint  Patient presents with  . Shortness of Breath  . Chest Pain    HPI James Nicholson is a 52 y.o. male.  HPI   Level 5 caveat due to underlying mental illness.  Brother states patient has some difficulty communicating at baseline and is in a group home.  James Nicholson is a 52 y.o. male, with a history of A. fib, cardiomyopathy, CKD on dialysis, hyperlipidemia, HTN, ICD, and schizophrenia, presenting to the ED with nonproductive cough and shortness of breath for the last 3 days.  Patient has history of schizophrenia and much of the history is provided by the patient's brother at the bedside. Has chest soreness with coughing throughout the chest.  Intermittent nausea and vomiting.  Also notes irritation and redness to the right eye for about the last month.  Denies visual deficit or trauma.  Dialysis patient on a Monday, Wednesday, Friday schedule with last dialysis Friday, November 29.  Denies fever/chills, abdominal pain, diaphoresis, diarrhea, orthopnea, lower extremity edema/pain, or any other complaints.     Past Medical History:  Diagnosis Date  . Anemia   . Atrial fibrillation   . Cardiomyopathy ([place]) 2010   Unclear Etiology: Last Echo 06/2009: EF 40-45%, severe Lateral & apical Hypokinesis (? CAD) ; Grade 2 DDysfxn. Mild conc LVH.   Marland Kitchen Cellulitis   . Chronic kidney disease (CKD), stage V ([place])    Dialysis Tue, Thurs, Sat  . Dyslipidemia   . Enterobacter sepsis ([place])   . Hyperlipidemia   . Hypertension   . IDDM (insulin dependent diabetes mellitus) ([place]) 11/03/2014  . S/P ICD (internal cardiac defibrillator) procedure 2010   VT Arrest (in [place])  . Schizophrenia ([place])   . 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 Conjunctivitis  09/27/2018  . Chronic kidney disease   . Hyperlipidemia   . Sepsis ([place]) 11/03/2014  . IDDM (insulin dependent diabetes mellitus) ([place]) 11/03/2014  . Troponin level elevated 12/22/2013    Class: Acute  . Mechanical complication of other vascular device, implant, and graft 08/04/2013  . Other complications due to renal dialysis device, implant, and graft 08/04/2013  . End stage renal disease ([place]) 04/13/2013  . Anemia in chronic kidney disease 02/05/2013  . Pneumonia 02/05/2013  . GI bleed 02/05/2013  . Metabolic acidosis 09/38/1829  . 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 PPD positive - completed 9 months INH (per Dr. Linus Salmons) 05/25/2012  . Other primary cardiomyopathies 05/07/2012  . Ventricular fibrillation ([place]) 05/07/2012    Class: History of  . Hypertension 05/07/2012  . Automatic implantable cardioverter-defibrillator in situ 05/07/2012    Past Surgical History:  Procedure Laterality Date  . AV FISTULA PLACEMENT Right 02/10/2013   Procedure: ARTERIOVENOUS (AV) FISTULA CREATION;  Surgeon: Rosetta Posner, MD;  Location: [facility] OR;  Service: Vascular;  Laterality: Right;  Right forearm radial/cephalic arterovenous fistula.   Marland Kitchen CARDIAC CATHETERIZATION  2010   [place]: In setting of VT arrest. Per brother's report, nonobstructive with no intervention  . CARDIAC DEFIBRILLATOR PLACEMENT  2010   [place]  . FISTULOGRAM Right 08/09/2013   Procedure: FISTULOGRAM;  Surgeon: Angelia Mould, MD;  Location: [facility] CATH LAB;  Service: Cardiovascular;  Laterality: Right;  . ICD GENERATOR CHANGEOUT N/A 12/05/2017   Procedure: ICD GENERATOR  CHANGEOUT;  Surgeon: Evans Lance, MD;  Location: [facility] CV LAB;  Service: Cardiovascular;  Laterality: N/A;  . LIGATION OF COMPETING BRANCHES OF ARTERIOVENOUS FISTULA Right 08/13/2013   Procedure: LIGATION OF COMPETING BRANCHES X5 OF ARTERIOVENOUS FISTULA- RIGHT ARM;  Surgeon: Serafina Mitchell, MD;  Location: [facility] OR;  Service: Vascular;   Laterality: Right;        Home Medications    Prior to Admission medications   Medication Sig Start Date End Date Taking? Authorizing Provider  calcium acetate (PHOSLO) 667 MG capsule Take 1,334 mg by mouth 3 (three) times daily with meals. 8a, 12p, 5p 02/11/13  Yes Regalado, Belkys A, MD  gabapentin (NEURONTIN) 300 MG capsule Take 300 mg by mouth 2 (two) times daily.   Yes [provider]  gemfibrozil (LOPID) 600 MG tablet Take 600 mg by mouth 2 (two) times daily before a meal.   Yes [provider]  insulin detemir (LEVEMIR) 100 UNIT/ML injection Inject 0.05 mLs (5 Units total) into the skin at bedtime. 02/11/13  Yes Regalado, Belkys A, MD  metoprolol succinate (TOPROL-XL) 50 MG 24 hr tablet Take 50 mg by mouth daily. Take with or immediately following a meal.    Yes [provider]  omeprazole (PRILOSEC) 20 MG capsule Take 20 mg by mouth every morning.    Yes [provider]  risperiDONE (RISPERDAL) 0.25 MG tablet Take 0.25 mg by mouth at bedtime.   Yes [provider]  traZODone (DESYREL) 100 MG tablet Take 100 mg by mouth at bedtime.   Yes [provider]    Family History Family History  Problem Relation Age of Onset  . CAD Mother     Social History Social History   Tobacco Use  . Smoking status: Former Smoker    Last attempt to quit: 10/28/2008    Years since quitting: 9.9  . Smokeless tobacco: Never Used  Substance Use Topics  . Alcohol use: No  . Drug use: No     Allergies   Patient has no known allergies.   Review of Systems Review of Systems  Constitutional: Negative for chills, diaphoresis and fever.  Respiratory: Positive for cough and shortness of breath.   Cardiovascular: Negative for leg swelling.  Gastrointestinal: Negative for abdominal pain, blood in stool, diarrhea, nausea and vomiting.  Neurological: Negative for syncope.  All other systems reviewed and are negative.    Physical Exam Updated  Vital Signs BP 130/88   Pulse 97   Temp 98 F (36.7 C) (Oral)   Resp 14   SpO2 98%   Physical Exam  Constitutional: He appears well-developed and well-nourished. No distress.  HENT:  Head: Normocephalic and atraumatic.  Eyes: Pupils are equal, round, and reactive to light. EOM are normal. Right conjunctiva is injected.  No contact lenses in place.  [place] Lamp exam shows no increased uptake of fluorescein. Slit lamp exam was also performed with no noted signs of corneal abrasion or ulcer, iritis, anterior chamber damage, foreign bodies, or globe damage.  No noted angle changes when compared to opposite eye. Tono-Pen values: Right eye: 20  Left eye: 16    Visual Acuity  Right Eye Distance: 20/25(with glasses) Left Eye Distance: 20/20(with glasses) Bilateral Distance: 20/20(with glasses)  Right Eye Near:   Left Eye Near:    Bilateral Near:      Neck: Neck supple.  Cardiovascular: Normal rate, regular rhythm, normal heart sounds and intact distal pulses.  Pulmonary/Chest: Effort normal. No respiratory  distress. He has rhonchi in the right lower field and the left lower field. He exhibits tenderness. He exhibits no crepitus and no edema.  Tenderness throughout the anterior chest wall.  Abdominal: Soft. There is no tenderness. There is no guarding.  Musculoskeletal: He exhibits no edema.  Lymphadenopathy:    He has no cervical adenopathy.  Neurological: He is alert.  Skin: Skin is warm and dry. He is not diaphoretic.  Psychiatric: He has a normal mood and affect. His behavior is normal.  Nursing note and vitals reviewed.    ED Treatments / Results  Labs (all labs ordered are listed, but only abnormal results are displayed) Labs Reviewed  COMPREHENSIVE METABOLIC PANEL - Abnormal; Notable for the following components:      Result Value   BUN 54 (*)    Creatinine, Ser 10.18 (*)    Albumin 3.2 (*)    AST 12 (*)    GFR calc non Af Amer 5 (*)    GFR calc Af Amer 6 (*)     Anion gap 17 (*)    All other components within normal limits  CBC WITH DIFFERENTIAL/PLATELET - Abnormal; Notable for the following components:   WBC 12.1 (*)    RBC 3.54 (*)    Hemoglobin 10.7 (*)    HCT 35.5 (*)    MCV 100.3 (*)    Neutro Abs 8.5 (*)    Monocytes Absolute 1.1 (*)    Eosinophils Absolute 0.9 (*)    Abs Immature Granulocytes 0.08 (*)    All other components within normal limits  INFLUENZA PANEL BY PCR (TYPE A & B)  I-STAT TROPONIN, ED    EKG EKG Interpretation  Date/Time:  Sunday September 27 2018 08:44:05 EST Ventricular Rate:  97 PR Interval:    QRS Duration: 130 QT Interval:  372 QTC Calculation: 473 R Axis:   -39 Text Interpretation:  Sinus rhythm Right bundle branch block Since last tracing rate slower Confirmed by Orlie Dakin [phone]) on 09/27/2018 8:49:30 AM Also confirmed by Orlie Dakin [phone]), editor Philomena Doheny [phone])  on 09/27/2018 1:08:37 PM   Radiology Dg Chest 2 View  Result Date: 09/27/2018 CLINICAL DATA:  Chest pain and shortness of breath.  Cough. EXAM: CHEST - 2 VIEW COMPARISON:  11/03/2014 FINDINGS: Stable position of the cardiac pacemaker. Enlarged cardiac silhouette.  Mediastinal contours appear intact. There is no evidence of focal pleural effusion or pneumothorax. Elevation of the right hemidiaphragm likely due to volume loss in the right hemithorax. Subtle peribronchial bilateral lower lobe opacities. Osseous structures are without acute abnormality. Soft tissues are grossly normal. IMPRESSION: Bilateral peribronchial lower lobe airspace opacities which may represent atelectasis or airspace consolidation. Electronically Signed   By: Fidela Salisbury M.D.   On: 09/27/2018 10:00    Procedures Procedures (including critical care time)  Medications Ordered in ED Medications  vancomycin (VANCOCIN) IVPB 750 mg/150 ml premix (has no administration in time range)  ciprofloxacin (CILOXAN) 0.3 % ophthalmic solution 2 drop (has no  administration in time range)  ipratropium-albuterol (DUONEB) 0.5-2.5 (3) MG/3ML nebulizer solution 3 mL (3 mLs Nebulization Given 09/27/18 0900)  fluorescein ophthalmic strip 1 strip (1 strip Right Eye Given 09/27/18 0902)  tetracaine (PONTOCAINE) 0.5 % ophthalmic solution 2 drop (2 drops Both Eyes Given 09/27/18 0902)  vancomycin (VANCOCIN) 1,750 mg in sodium chloride 0.9 % 500 mL IVPB (1,750 mg Intravenous New Bag/Given 09/27/18 1145)  ceFEPIme (MAXIPIME) 2 g in sodium chloride 0.9 % 100 mL IVPB (0 g  Intravenous Stopped 09/27/18 1311)     Initial Impression / Assessment and Plan / ED Course  I have reviewed the triage vital signs and the nursing notes.  Pertinent labs & imaging results that were available during my care of the patient were reviewed by me and considered in my medical decision making (see chart for details).  Clinical Course as of Sep 27 1441  Sun Sep 27, 2018  1147 Spoke with Dr. Sarajane Jews, hospitalist.   [SJ]    Clinical Course User Index [SJ] Sherman Donaldson, Helane Gunther, PA-C    Patient presents with shortness of breath, cough, and chest soreness.  Suspect chest soreness is musculoskeletal due to coughing. He has mild leukocytosis.  Evidence of consolidation in the lower lobes bilaterally on chest x-ray. Concern for HCAP.  Some shortness of breath and drop in SPO2 with ambulation.  Admitted for further management.  Findings and plan of care discussed with Orlie Dakin, MD. Dr. Winfred Leeds personally evaluated and examined this patient.  Final Clinical Impressions(s) / ED Diagnoses   Final diagnoses:  HCAP (healthcare-associated pneumonia)    ED Discharge Orders    None       Layla Maw 09/27/18 1442    Orlie Dakin, MD 09/27/18 1726

## 2018-09-27 NOTE — Progress Notes (Signed)
Pharmacy Antibiotic Note  Truth Barot is a 52 y.o. male admitted on 09/27/2018 with cough, SOB and bilateral leg swelling.   Per documentation, patient received dialysis 2 days PTA.  Pharmacy has been consulted for vancomycin dosing for HCAP.  Afebrile, WBC 12.1.   Plan: Vanc 1750mg  IV x 1, then 750mg  IV qHD MWF Change cefepime to 2gm IV x1.  F/U with continuation of Gram negative coverage Monitor HD schedule/tolerance, clinical progress, vanc level as indicated    Temp (24hrs), Avg:98.4 F (36.9 C), Min:98 F (36.7 C), Max:98.7 F (37.1 C)  Recent Labs  Lab 09/27/18 0856  WBC 12.1*  CREATININE 10.18*    CrCl cannot be calculated (Unknown ideal weight.).    No Known Allergies  Vanc 12/1 >> Cefepime  Lucely Leard D. Mina Marble, PharmD, BCPS, [place] 09/27/2018, 11:36 AM

## 2018-09-28 DIAGNOSIS — J189 Pneumonia, unspecified organism: Secondary | ICD-10-CM | POA: Diagnosis not present

## 2018-09-28 DIAGNOSIS — E119 Type 2 diabetes mellitus without complications: Secondary | ICD-10-CM | POA: Diagnosis not present

## 2018-09-28 DIAGNOSIS — N2581 Secondary hyperparathyroidism of renal origin: Secondary | ICD-10-CM | POA: Diagnosis not present

## 2018-09-28 DIAGNOSIS — I2510 Atherosclerotic heart disease of native coronary artery without angina pectoris: Secondary | ICD-10-CM | POA: Diagnosis present

## 2018-09-28 DIAGNOSIS — Z95810 Presence of automatic (implantable) cardiac defibrillator: Secondary | ICD-10-CM | POA: Diagnosis not present

## 2018-09-28 DIAGNOSIS — Y95 Nosocomial condition: Secondary | ICD-10-CM | POA: Diagnosis present

## 2018-09-28 DIAGNOSIS — Z8249 Family history of ischemic heart disease and other diseases of the circulatory system: Secondary | ICD-10-CM | POA: Diagnosis not present

## 2018-09-28 DIAGNOSIS — H1031 Unspecified acute conjunctivitis, right eye: Secondary | ICD-10-CM | POA: Diagnosis not present

## 2018-09-28 DIAGNOSIS — I428 Other cardiomyopathies: Secondary | ICD-10-CM | POA: Diagnosis present

## 2018-09-28 DIAGNOSIS — I5022 Chronic systolic (congestive) heart failure: Secondary | ICD-10-CM | POA: Diagnosis present

## 2018-09-28 DIAGNOSIS — I120 Hypertensive chronic kidney disease with stage 5 chronic kidney disease or end stage renal disease: Secondary | ICD-10-CM | POA: Diagnosis not present

## 2018-09-28 DIAGNOSIS — M3130 Wegener's granulomatosis without renal involvement: Secondary | ICD-10-CM | POA: Diagnosis present

## 2018-09-28 DIAGNOSIS — H109 Unspecified conjunctivitis: Secondary | ICD-10-CM | POA: Diagnosis present

## 2018-09-28 DIAGNOSIS — B59 Pneumocystosis: Secondary | ICD-10-CM | POA: Diagnosis not present

## 2018-09-28 DIAGNOSIS — I132 Hypertensive heart and chronic kidney disease with heart failure and with stage 5 chronic kidney disease, or end stage renal disease: Secondary | ICD-10-CM | POA: Diagnosis present

## 2018-09-28 DIAGNOSIS — E785 Hyperlipidemia, unspecified: Secondary | ICD-10-CM | POA: Diagnosis present

## 2018-09-28 DIAGNOSIS — D631 Anemia in chronic kidney disease: Secondary | ICD-10-CM | POA: Diagnosis not present

## 2018-09-28 DIAGNOSIS — Z992 Dependence on renal dialysis: Secondary | ICD-10-CM | POA: Diagnosis not present

## 2018-09-28 DIAGNOSIS — N186 End stage renal disease: Secondary | ICD-10-CM | POA: Diagnosis not present

## 2018-09-28 DIAGNOSIS — Z87891 Personal history of nicotine dependence: Secondary | ICD-10-CM | POA: Diagnosis not present

## 2018-09-28 DIAGNOSIS — Z79899 Other long term (current) drug therapy: Secondary | ICD-10-CM | POA: Diagnosis not present

## 2018-09-28 DIAGNOSIS — F209 Schizophrenia, unspecified: Secondary | ICD-10-CM | POA: Diagnosis present

## 2018-09-28 DIAGNOSIS — Z8674 Personal history of sudden cardiac arrest: Secondary | ICD-10-CM | POA: Diagnosis not present

## 2018-09-28 DIAGNOSIS — E1122 Type 2 diabetes mellitus with diabetic chronic kidney disease: Secondary | ICD-10-CM | POA: Diagnosis present

## 2018-09-28 DIAGNOSIS — Z79891 Long term (current) use of opiate analgesic: Secondary | ICD-10-CM | POA: Diagnosis not present

## 2018-09-28 DIAGNOSIS — Z794 Long term (current) use of insulin: Secondary | ICD-10-CM | POA: Diagnosis not present

## 2018-09-28 LAB — HIV ANTIBODY (ROUTINE TESTING W REFLEX): HIV Screen 4th Generation wRfx: NONREACTIVE

## 2018-09-28 LAB — GLUCOSE, CAPILLARY: Glucose-Capillary: 134 mg/dL — ABNORMAL HIGH (ref 70–99)

## 2018-09-28 MED ORDER — HEPARIN SODIUM (PORCINE) 5000 UNIT/ML IJ SOLN
5000.0000 [IU] | Freq: Three times a day (TID) | INTRAMUSCULAR | Status: DC
  Administered 2018-09-28 – 2018-10-01 (×11): 5000 [IU] via SUBCUTANEOUS
  Filled 2018-09-28 (×11): qty 1

## 2018-09-28 MED ORDER — CHLORHEXIDINE GLUCONATE CLOTH 2 % EX PADS
6.0000 | MEDICATED_PAD | Freq: Every day | CUTANEOUS | Status: DC
  Administered 2018-09-29 – 2018-09-30 (×2): 6 via TOPICAL

## 2018-09-28 NOTE — Consult Note (Addendum)
[facility] Renal Consultation Note    Indication for Consultation:  Management of ESRD/hemodialysis, anemia, hypertension/volume, and secondary hyperparathyroidism. PCP:  HPI: James Nicholson is a 52 y.o. male with ESRD, HTN, Hx Wegener's, NICM (with ICD), and Hx A-fib who was admitted to OBS status with HCAP.   Per notes, pt presented to ED with c/o SOB and cough x 2 weeks, as well as R eye redness x 1 mo. Found to be afebrile without hypoxia, but labs with WBC 12.1 and CXR showing B peri-bronchial infiltrates. Flu test negative. He was started on Vanc/Cefepime.  Currently, c/o pleuritic R chest pains with coughing. Looks reasonably comfortable. Not requiring nasal oxygen. No N/V or diarrhea. Remains afebrile.  Dialyzes on MWF schedule at [facility] - due for dialysis today. Typically, he is very compliant with dialysis - last HD 11/29 which he completed in entirety - left minimally above his EDW.  Past Medical History:  Diagnosis Date  . Anemia   . Atrial fibrillation   . Cardiomyopathy ([place]) 2010   Unclear Etiology: Last Echo 06/2009: EF 40-45%, severe Lateral & apical Hypokinesis (? CAD) ; Grade 2 DDysfxn. Mild conc LVH.   Marland Kitchen Cellulitis   . Chronic kidney disease (CKD), stage V ([place])    Dialysis Tue, Thurs, Sat  . Dyslipidemia   . Enterobacter sepsis ([place])   . Hyperlipidemia   . Hypertension   . IDDM (insulin dependent diabetes mellitus) ([place]) 11/03/2014  . S/P ICD (internal cardiac defibrillator) procedure 2010   VT Arrest (in [place])  . Schizophrenia ([place])   . Wegener's disease, pulmonary ([place]) 02/05/2013   Past Surgical History:  Procedure Laterality Date  . AV FISTULA PLACEMENT Right 02/10/2013   Procedure: ARTERIOVENOUS (AV) FISTULA CREATION;  Surgeon: Rosetta Posner, MD;  Location: [facility] OR;  Service: Vascular;  Laterality: Right;  Right forearm radial/cephalic arterovenous fistula.   Marland Kitchen CARDIAC CATHETERIZATION  2010   [place]: In setting of VT arrest. Per brother's  report, nonobstructive with no intervention  . CARDIAC DEFIBRILLATOR PLACEMENT  2010   [place]  . FISTULOGRAM Right 08/09/2013   Procedure: FISTULOGRAM;  Surgeon: Angelia Mould, MD;  Location[facility] CATH LAB;  Service: Cardiovascular;  Laterality: Right;  . ICD GENERATOR CHANGEOUT N/A 12/05/2017   Procedure: ICD GENERATOR CHANGEOUT;  Surgeon: Evans Lance, MD;  Location: [facility] CV LAB;  Service: Cardiovascular;  Laterality: N/A;  . LIGATION OF COMPETING BRANCHES OF ARTERIOVENOUS FISTULA Right 08/13/2013   Procedure: LIGATION OF COMPETING BRANCHES X5 OF ARTERIOVENOUS FISTULA- RIGHT ARM;  Surgeon: Serafina Mitchell, MD;  Location: [facility] OR;  Service: Vascular;  Laterality: Right;   Family History  Problem Relation Age of Onset  . CAD Mother    Social History:  reports that he quit smoking about 9 years ago. He has never used smokeless tobacco. He reports that he does not drink alcohol or use drugs.  ROS: As per HPI otherwise negative.  Physical Exam: Vitals:   09/27/18 1649 09/27/18 1754 09/28/18 0019 09/28/18 0553  BP:  134/81 134/79 (!) 141/74  Pulse:  90 92 96  Resp:  18  17  Temp:  98.1 F (36.7 C) 98.4 F (36.9 C) 98.8 F (37.1 C)  TempSrc:  Oral Oral Oral  SpO2:  95% 93% 92%  Weight: 79.4 kg     Height: 5\' 9"  (1.753 m)        General: Well developed, well nourished, in no acute distress. Head: Normocephalic, atraumatic, sclera non-icteric, mucus membranes are  moist. Neck: Supple without lymphadenopathy/masses. JVD not elevated. Lungs: Diffuse wheezing/rhonchi throughout, no specific rales. Heart: RRR, 2/6 systolic murmur Abdomen: Soft, mild supra-pubic tenderness with palpation. Musculoskeletal:  Strength and tone appear normal for age. Lower extremities: No edema or ischemic changes, no open wounds. Neuro: Alert and oriented X 3. Moves all extremities spontaneously. Psych:  Responds to questions appropriately with a normal affect. English is not his 1st language,  but able to speak partially in [place] with me. Dialysis Access: AVF + bruit  No Known Allergies Prior to Admission medications   Medication Sig Start Date End Date Taking? Authorizing Provider  calcium acetate (PHOSLO) 667 MG capsule Take 1,334 mg by mouth 3 (three) times daily with meals. 8a, 12p, 5p 02/11/13  Yes Regalado, Belkys A, MD  gabapentin (NEURONTIN) 300 MG capsule Take 300 mg by mouth 2 (two) times daily.   Yes [provider]  gemfibrozil (LOPID) 600 MG tablet Take 600 mg by mouth 2 (two) times daily before a meal.   Yes [provider]  insulin detemir (LEVEMIR) 100 UNIT/ML injection Inject 0.05 mLs (5 Units total) into the skin at bedtime. 02/11/13  Yes Regalado, Belkys A, MD  metoprolol succinate (TOPROL-XL) 50 MG 24 hr tablet Take 50 mg by mouth daily. Take with or immediately following a meal.    Yes [provider]  omeprazole (PRILOSEC) 20 MG capsule Take 20 mg by mouth every morning.    Yes [provider]  risperiDONE (RISPERDAL) 0.25 MG tablet Take 0.25 mg by mouth at bedtime.   Yes [provider]  traZODone (DESYREL) 100 MG tablet Take 100 mg by mouth at bedtime.   Yes [provider]   Current Facility-Administered Medications  Medication Dose Route Frequency Provider Last Rate Last Dose  . 0.9 %  sodium chloride infusion  250 mL Intravenous PRN Samuella Cota, MD      . acetaminophen (TYLENOL) tablet 650 mg  650 mg Oral Q6H PRN Samuella Cota, MD   650 mg at 09/28/18 9485   Or  . acetaminophen (TYLENOL) suppository 650 mg  650 mg Rectal Q6H PRN Samuella Cota, MD      . azithromycin [facility]) tablet 500 mg  500 mg Oral Q24H Samuella Cota, MD   500 mg at 09/27/18 1742  . calcium acetate (PHOSLO) capsule 1,334 mg  1,334 mg Oral TID WC Samuella Cota, MD   1,334 mg at 09/28/18 0935  . cefTRIAXone (ROCEPHIN) 1 g in sodium chloride 0.9 % 100 mL IVPB  1 g Intravenous Q24H Samuella Cota, MD 200  mL/hr at 09/27/18 1740 1 g at 09/27/18 1740  . ciprofloxacin (CILOXAN) 0.3 % ophthalmic solution 2 drop  2 drop Right Eye Q4H while awake Samuella Cota, MD   2 drop at 09/28/18 0936  . gabapentin (NEURONTIN) capsule 300 mg  300 mg Oral BID Samuella Cota, MD   300 mg at 09/28/18 0936  . heparin injection 5,000 Units  5,000 Units Subcutaneous Q8H Reyne Dumas, MD   5,000 Units at 09/28/18 0937  . insulin detemir (LEVEMIR) injection 5 Units  5 Units Subcutaneous QHS Samuella Cota, MD   5 Units at 09/27/18 2221  . metoprolol succinate (TOPROL-XL) 24 hr tablet 50 mg  50 mg Oral Daily Samuella Cota, MD   50 mg at 09/28/18 4627  . ondansetron (ZOFRAN) tablet 4 mg  4 mg Oral Q6H PRN Samuella Cota, MD  Or  . ondansetron (ZOFRAN) injection 4 mg  4 mg Intravenous Q6H PRN Samuella Cota, MD      . pantoprazole (PROTONIX) EC tablet 40 mg  40 mg Oral Daily Samuella Cota, MD   40 mg at 09/28/18 3335  . risperiDONE (RISPERDAL) tablet 0.25 mg  0.25 mg Oral QHS Samuella Cota, MD   0.25 mg at 09/27/18 2127  . sodium chloride flush (NS) 0.9 % injection 3 mL  3 mL Intravenous Q12H Samuella Cota, MD   3 mL at 09/28/18 4562  . sodium chloride flush (NS) 0.9 % injection 3 mL  3 mL Intravenous PRN Samuella Cota, MD      . traZODone (DESYREL) tablet 100 mg  100 mg Oral QHS Samuella Cota, MD   100 mg at 09/27/18 2127   Labs: Basic Metabolic Panel: Recent Labs  Lab 09/27/18 0856  NA 139  K 4.7  CL 99  CO2 23  GLUCOSE 88  BUN 54*  CREATININE 10.18*  CALCIUM 9.6   Liver Function Tests: Recent Labs  Lab 09/27/18 0856  AST 12*  ALT 11  ALKPHOS 55  BILITOT 1.1  PROT 7.8  ALBUMIN 3.2*   CBC: Recent Labs  Lab 09/27/18 0856  WBC 12.1*  NEUTROABS 8.5*  HGB 10.7*  HCT 35.5*  MCV 100.3*  PLT 240   Studies/Results: Dg Chest 2 View  Result Date: 09/27/2018 CLINICAL DATA:  Chest pain and shortness of breath.  Cough. EXAM: CHEST - 2 VIEW  COMPARISON:  11/03/2014 FINDINGS: Stable position of the cardiac pacemaker. Enlarged cardiac silhouette.  Mediastinal contours appear intact. There is no evidence of focal pleural effusion or pneumothorax. Elevation of the right hemidiaphragm likely due to volume loss in the right hemithorax. Subtle peribronchial bilateral lower lobe opacities. Osseous structures are without acute abnormality. Soft tissues are grossly normal. IMPRESSION: Bilateral peribronchial lower lobe airspace opacities which may represent atelectasis or airspace consolidation. Electronically Signed   By: Fidela Salisbury M.D.   On: 09/27/2018 10:00   Dialysis Orders:  MWF at [facility] 4hr, 2K/2Ca, EDW 80kg, R AVF, heparin 7600 units  - Mircera 56mcg IV q 2 weeks (last 11/20)  Assessment/Plan: 1.  HCAP: On Vanc/Cefepime. Per primary. Diffuse wheezing throughout lung fields and with R sided pleuritic CP. 2.  ESRD: MWF schedule, will go ahead and plan to dialyze here today.  3.  Hypertension/volume: BP ok, below EDW - low UF goal. 4.  Anemia: Hgb 10.7. Due for ESA later this week. 5.  Metabolic bone disease: Ca ok, Phos pending. 6.  NICM (w/ AICD) 7.  R eye conjunctivitis (?): Per primary.  Veneta Penton, PA-C 09/28/2018, 10:09 AM  [facility] Pager: [phone]

## 2018-09-28 NOTE — Care Management Obs Status (Signed)
[place] NOTIFICATION   Patient Details  Name: James Nicholson MRN: [id number] Date of Birth: 1965/12/20   Medicare Observation Status Notification Given:  Yes  CM discussed [place] with patient stating able to understand some English. CM attempted to reach brother to also discuss with no answer.   Midge Minium RN, BSN, NCM-BC, ACM-RN [phone] 09/28/2018, 4:17 PM

## 2018-09-28 NOTE — Progress Notes (Addendum)
[facility] PROGRESS NOTE  James Nicholson ZOX:[id number] DOB: 1966-01-19 DOA: 09/27/2018   PCP: [name], MD     Assessment/Plan: Principal Problem:   Pneumonia Active Problems:   Anemia in chronic kidney disease   Wegener's disease, pulmonary ([place])   End stage renal disease (HCC)   IDDM (insulin dependent diabetes mellitus) ([place])   Conjunctivitis   52 year old man PMH Wegener's granulomatosis, atrial fibrillation, cardiomyopathy, ESRD, ICD, schizophrenia presented to the emergency department with shortness of breath, cough, vomiting.  Referred for observation for pneumonia.  Assessment and plan Community-acquired pneumonia in a patient with Wegener's granulomatosis lung disease and end-stage renal disease with some vomiting at home Empiric antibiotics for bilateral peribronchial lower lobe airspace opacity/ Pneumonia  Continue observation pending results of blood culture and symptomatic improvement. Patient still complaining of shortness of breath  Right eye conjunctivitis, possibly bacterial --Empiric eyedrops  End-stage renal disease on hemodialysis Monday Wednesday Friday --Routine consultation for hemodialysis 12/2 sent to Dr. Posey Pronto,   Wegener's granulomatosis lung disease --Appears stable  Anemia of chronic disease appears to be stable.  Atrial fibrillation, cardiomyopathy, ICD placement --Appears stable.  Continue metoprolol  Diabetes mellitus type 2 on insulin --Continue Levemir , and SSI  Schizophrenia --Appears stable.  Continue Risperdal, trazodone    DVT prophylaxsis  heparin  Code Status:  Full code     Family Communication: Discussed in detail with the patient, all imaging results, lab results explained to the patient   Disposition Plan:   Given the fact that the patient continues to be short of breath and is pending dialysis, we will continue him as inpatient today. Also his blood cultures are pending and given  the fact that he is a dialysis patient, it would be premature to discharge him without verifying that his [facility] are  negative       Consultants:  nephrology  Procedures:  hemodialysis  Antibiotics: Anti-infectives (From admission, onward)   Start     Dose/Rate Route Frequency Ordered Stop   09/28/18 1200  vancomycin (VANCOCIN) IVPB 750 mg/150 ml premix  Status:  Discontinued     750 mg 150 mL/hr over 60 Minutes Intravenous Every M-W-F (Hemodialysis) 09/27/18 1137 09/27/18 1647   09/27/18 1800  cefTRIAXone (ROCEPHIN) 1 g in sodium chloride 0.9 % 100 mL IVPB     1 g 200 mL/hr over 30 Minutes Intravenous Every 24 hours 09/27/18 1647 10/04/18 1759   09/27/18 1700  azithromycin (ZITHROMAX) tablet 500 mg     500 mg Oral Every 24 hours 09/27/18 1647 10/04/18 1659   09/27/18 1145  vancomycin (VANCOCIN) 1,750 mg in sodium chloride 0.9 % 500 mL IVPB     1,750 mg 250 mL/hr over 120 Minutes Intravenous  Once 09/27/18 1131 09/27/18 1345   09/27/18 1145  ceFEPIme (MAXIPIME) 2 g in sodium chloride 0.9 % 100 mL IVPB     2 g 200 mL/hr over 30 Minutes Intravenous  Once 09/27/18 1132 09/27/18 1311   09/27/18 1130  vancomycin (VANCOCIN) 1,500 mg in sodium chloride 0.9 % 500 mL IVPB  Status:  Discontinued     1,500 mg 250 mL/hr over 120 Minutes Intravenous  Once 09/27/18 1119 09/27/18 1131   09/27/18 1050  ceFEPIme (MAXIPIME) 1 g in sodium chloride 0.9 % 100 mL IVPB  Status:  Discontinued     1 g 200 mL/hr over 30 Minutes Intravenous  Once 09/27/18 1038 09/27/18 1132         HPI/Subjective: Continues  to be short of breath, coughing spells, no chest pain  Objective: Vitals:   09/27/18 1649 09/27/18 1754 09/28/18 0019 09/28/18 0553  BP:  134/81 134/79 (!) 141/74  Pulse:  90 92 96  Resp:  18  17  Temp:  98.1 F (36.7 C) 98.4 F (36.9 C) 98.8 F (37.1 C)  TempSrc:  Oral Oral Oral  SpO2:  95% 93% 92%  Weight: 79.4 kg     Height: 5\' 9"  (1.753 m)       Intake/Output Summary (Last 24  hours) at 09/28/2018 0759 Last data filed at 09/27/2018 1800 Gross per 24 hour  Intake 440 ml  Output -  Net 440 ml    Exam:  Examination:  General exam: Appears calm and comfortable  Respiratory system: by basilar rhonchi. Increased Respiratory effort normal. Cardiovascular system: S1 & S2 heard, RRR. No JVD, murmurs, rubs, gallops or clicks. No pedal edema. Gastrointestinal system: Abdomen is nondistended, soft and nontender. No organomegaly or masses felt. Normal bowel sounds heard. Central nervous system: Alert and oriented. No focal neurological deficits. Extremities: Symmetric 5 x 5 power. Skin: No rashes, lesions or ulcers Psychiatry: Judgement and insight appear normal. Mood & affect appropriate.     Data Reviewed: I have personally reviewed following labs and imaging studies  Micro Results Recent Results (from the past 240 hour(s))  Culture, blood (routine x 2) Call MD if unable to obtain prior to antibiotics being given     Status: None (Preliminary result)   Collection Time: 09/27/18  5:04 PM  Result Value Ref Range Status   Specimen Description BLOOD BLOOD LEFT ARM  Final   Special Requests   Final    BOTTLES DRAWN AEROBIC ONLY Blood Culture adequate volume   Culture   Final    NO GROWTH < 24 HOURS Performed at [facility], [address] [place], [place] [address]    Report Status PENDING  Incomplete  Culture, blood (routine x 2) Call MD if unable to obtain prior to antibiotics being given     Status: None (Preliminary result)   Collection Time: 09/27/18  5:04 PM  Result Value Ref Range Status   Specimen Description BLOOD THUMB  Final   Special Requests   Final    BOTTLES DRAWN AEROBIC ONLY Blood Culture adequate volume   Culture   Final    NO GROWTH < 24 HOURS Performed at [facility], [address] [place], [place] [address]    Report Status PENDING  Incomplete    Radiology Reports Dg Chest 2 View  Result Date: 09/27/2018 CLINICAL  DATA:  Chest pain and shortness of breath.  Cough. EXAM: CHEST - 2 VIEW COMPARISON:  11/03/2014 FINDINGS: Stable position of the cardiac pacemaker. Enlarged cardiac silhouette.  Mediastinal contours appear intact. There is no evidence of focal pleural effusion or pneumothorax. Elevation of the right hemidiaphragm likely due to volume loss in the right hemithorax. Subtle peribronchial bilateral lower lobe opacities. Osseous structures are without acute abnormality. Soft tissues are grossly normal. IMPRESSION: Bilateral peribronchial lower lobe airspace opacities which may represent atelectasis or airspace consolidation. Electronically Signed   By: Fidela Salisbury M.D.   On: 09/27/2018 10:00     CBC Recent Labs  Lab 09/27/18 0856  WBC 12.1*  HGB 10.7*  HCT 35.5*  PLT 240  MCV 100.3*  MCH 30.2  MCHC 30.1  RDW 14.7  LYMPHSABS 1.5  MONOABS 1.1*  EOSABS 0.9*  BASOSABS 0.1  Chemistries  Recent Labs  Lab 09/27/18 0856  NA 139  K 4.7  CL 99  CO2 23  GLUCOSE 88  BUN 54*  CREATININE 10.18*  CALCIUM 9.6  AST 12*  ALT 11  ALKPHOS 55  BILITOT 1.1   ------------------------------------------------------------------------------------------------------------------ estimated creatinine clearance is 8.6 mL/min (A) (by C-G formula based on SCr of 10.18 mg/dL (H)). ------------------------------------------------------------------------------------------------------------------ No results for input(s): HGBA1C in the last 72 hours. ------------------------------------------------------------------------------------------------------------------ No results for input(s): CHOL, HDL, LDLCALC, TRIG, CHOLHDL, LDLDIRECT in the last 72 hours. ------------------------------------------------------------------------------------------------------------------ No results for input(s): TSH, T4TOTAL, T3FREE, THYROIDAB in the last 72 hours.  Invalid input(s):  FREET3 ------------------------------------------------------------------------------------------------------------------ No results for input(s): VITAMINB12, FOLATE, FERRITIN, TIBC, IRON, RETICCTPCT in the last 72 hours.  Coagulation profile No results for input(s): INR, PROTIME in the last 168 hours.  No results for input(s): DDIMER in the last 72 hours.  Cardiac Enzymes No results for input(s): CKMB, TROPONINI, MYOGLOBIN in the last 168 hours.  Invalid input(s): CK ------------------------------------------------------------------------------------------------------------------ Invalid input(s): POCBNP   CBG: Recent Labs  Lab 09/27/18 2126  GLUCAP 138*       Studies: Dg Chest 2 View  Result Date: 09/27/2018 CLINICAL DATA:  Chest pain and shortness of breath.  Cough. EXAM: CHEST - 2 VIEW COMPARISON:  11/03/2014 FINDINGS: Stable position of the cardiac pacemaker. Enlarged cardiac silhouette.  Mediastinal contours appear intact. There is no evidence of focal pleural effusion or pneumothorax. Elevation of the right hemidiaphragm likely due to volume loss in the right hemithorax. Subtle peribronchial bilateral lower lobe opacities. Osseous structures are without acute abnormality. Soft tissues are grossly normal. IMPRESSION: Bilateral peribronchial lower lobe airspace opacities which may represent atelectasis or airspace consolidation. Electronically Signed   By: Fidela Salisbury M.D.   On: 09/27/2018 10:00      Lab Results  Component Value Date   HGBA1C 4.5 12/21/2013   Lab Results  Component Value Date   LDLCALC 104 (H) 12/23/2013   CREATININE 10.18 (H) 09/27/2018       Scheduled Meds: . azithromycin  500 mg Oral Q24H  . calcium acetate  1,334 mg Oral TID WC  . ciprofloxacin  2 drop Right Eye Q4H while awake  . gabapentin  300 mg Oral BID  . insulin detemir  5 Units Subcutaneous QHS  . metoprolol succinate  50 mg Oral Daily  . pantoprazole  40 mg Oral Daily   . risperiDONE  0.25 mg Oral QHS  . sodium chloride flush  3 mL Intravenous Q12H  . traZODone  100 mg Oral QHS   Continuous Infusions: . sodium chloride    . cefTRIAXone (ROCEPHIN)  IV 1 g (09/27/18 1740)     LOS: 0 days    Time spent: >30 MINS    Reyne Dumas  [facility] Pager [phone]. If 7PM-7AM, please contact night-coverage at [email or web address], password [facility] 09/28/2018, 7:59 AM  LOS: 0 days

## 2018-09-28 NOTE — Evaluation (Signed)
Occupational Therapy Evaluation Patient Details Name: James Nicholson MRN: [id number] DOB: 08-16-1966 Today's Date: 09/28/2018    History of Present Illness James Nicholson is a 52 y.o. male with ESRD, HTN, Hx Wegener's, NICM (with ICD), and Hx A-fib who was admitted to OBS status with HCAP   Clinical Impression   Pt admitted with above problem list. Unclear PLOF and family support. Pt mod I for all ADLs and functional mobility. Depending on amount of support pt is able to receive at group home for cognitive deficits- would recommend SNF placement. No further OT needs at this time.     Follow Up Recommendations  Supervision/Assistance - 24 hour;Other (comment)(group home vs. SNF for cognitive needs)    Equipment Recommendations  None recommended by OT    Recommendations for Other Services Speech consult;PT consult     Precautions / Restrictions Precautions Precautions: Fall Restrictions Weight Bearing Restrictions: No      Mobility Bed Mobility Overal bed mobility: Independent                Transfers Overall transfer level: Modified independent Equipment used: None             General transfer comment: Moves carefully with increased time    Balance Overall balance assessment: Mild deficits observed, not formally tested                                         ADL either performed or assessed with clinical judgement   ADL Overall ADL's : Modified independent                                       General ADL Comments: Mod I overall for ADLs, including standing level toileting     Vision Baseline Vision/History: Wears glasses Wears Glasses: At all times Patient Visual Report: No change from baseline Vision Assessment?: Yes Eye Alignment: Within Functional Limits Ocular Range of Motion: Within Functional Limits Alignment/Gaze Preference: Within Defined Limits Tracking/Visual Pursuits: Requires cues, head turns, or  add eye shifts to track Saccades: Within functional limits            Pertinent Vitals/Pain Pain Assessment: No/denies pain     Hand Dominance Right   Extremity/Trunk Assessment Upper Extremity Assessment Upper Extremity Assessment: Overall WFL for tasks assessed   Lower Extremity Assessment Lower Extremity Assessment: Defer to PT evaluation   Cervical / Trunk Assessment Cervical / Trunk Assessment: Normal   Communication Communication Communication: Prefers language other than English(serbian)   Cognition Arousal/Alertness: Awake/alert Behavior During Therapy: WFL for tasks assessed/performed Overall Cognitive Status: No family/caregiver present to determine baseline cognitive functioning                                     General Comments  Pt's brother did not want use of interpreter, unclear how much pt understands vs. cognitive deficits            Home Living Family/patient expects to be discharged to:: Group home                                 Additional Comments: Per RN pt is from group home  Prior Functioning/Environment Level of Independence: Independent        Comments: unable to fully assess        OT Problem List: Impaired balance (sitting and/or standing);Decreased cognition         OT Goals(Current goals can be found in the care plan section) Acute Rehab OT Goals Patient Stated Goal: did not state OT Goal Formulation: All assessment and education complete, DC therapy      AM-PAC OT "6 Clicks" Daily Activity     Outcome Measure Help from another person eating meals?: None Help from another person taking care of personal grooming?: None Help from another person toileting, which includes using toliet, bedpan, or urinal?: None Help from another person bathing (including washing, rinsing, drying)?: None Help from another person to put on and taking off regular upper body clothing?: None Help from another  person to put on and taking off regular lower body clothing?: None 6 Click Score: 24   End of Session Nurse Communication: Mobility status  Activity Tolerance: Patient tolerated treatment well Patient left: in bed;with call bell/phone within reach;with bed alarm set  OT Visit Diagnosis: Unsteadiness on feet (R26.81)                Time: 7948-0165 OT Time Calculation (min): 10 min Charges:  OT General Charges $OT Visit: 1 Visit OT Evaluation $OT Eval Low Complexity: 1 Low  Curtis Sites OTR/L  09/28/2018, 3:36 PM

## 2018-09-29 ENCOUNTER — Other Ambulatory Visit: Payer: Self-pay

## 2018-09-29 ENCOUNTER — Encounter (HOSPITAL_COMMUNITY): Payer: Self-pay

## 2018-09-29 LAB — CBC
HCT: 31.6 % — ABNORMAL LOW (ref 39.0–52.0)
Hemoglobin: 9.5 g/dL — ABNORMAL LOW (ref 13.0–17.0)
MCH: 30.3 pg (ref 26.0–34.0)
MCHC: 30.1 g/dL (ref 30.0–36.0)
MCV: 100.6 fL — ABNORMAL HIGH (ref 80.0–100.0)
Platelets: 215 10*3/uL (ref 150–400)
RBC: 3.14 MIL/uL — ABNORMAL LOW (ref 4.22–5.81)
RDW: 14.6 % (ref 11.5–15.5)
WBC: 15.1 10*3/uL — ABNORMAL HIGH (ref 4.0–10.5)
nRBC: 0 % (ref 0.0–0.2)

## 2018-09-29 LAB — RENAL FUNCTION PANEL
Albumin: 2.7 g/dL — ABNORMAL LOW (ref 3.5–5.0)
Anion gap: 18 — ABNORMAL HIGH (ref 5–15)
BUN: 86 mg/dL — ABNORMAL HIGH (ref 6–20)
CO2: 17 mmol/L — ABNORMAL LOW (ref 22–32)
Calcium: 9.4 mg/dL (ref 8.9–10.3)
Chloride: 103 mmol/L (ref 98–111)
Creatinine, Ser: 14.3 mg/dL — ABNORMAL HIGH (ref 0.61–1.24)
GFR calc Af Amer: 4 mL/min — ABNORMAL LOW (ref >60)
GFR calc non Af Amer: 3 mL/min — ABNORMAL LOW (ref >60)
Glucose, Bld: 102 mg/dL — ABNORMAL HIGH (ref 70–99)
Phosphorus: 4.9 mg/dL — ABNORMAL HIGH (ref 2.5–4.6)
Potassium: 4.8 mmol/L (ref 3.5–5.1)
Sodium: 138 mmol/L (ref 135–145)

## 2018-09-29 LAB — GLUCOSE, CAPILLARY
Glucose-Capillary: 108 mg/dL — ABNORMAL HIGH (ref 70–99)
Glucose-Capillary: 82 mg/dL (ref 70–99)
Glucose-Capillary: 132 mg/dL — ABNORMAL HIGH (ref 70–99)
Glucose-Capillary: 118 mg/dL — ABNORMAL HIGH (ref 70–99)

## 2018-09-29 MED ORDER — PENTAFLUOROPROP-TETRAFLUOROETH EX AERO: 1.0000 "application " | INHALATION_SPRAY | CUTANEOUS | Status: DC | PRN

## 2018-09-29 MED ORDER — IPRATROPIUM-ALBUTEROL 0.5-2.5 (3) MG/3ML IN SOLN: 3.0000 mL | Freq: Two times a day (BID) | RESPIRATORY_TRACT | Status: DC

## 2018-09-29 MED ORDER — SODIUM CHLORIDE 0.9 % IV SOLN: 100.0000 mL | INTRAVENOUS | Status: DC | PRN

## 2018-09-29 MED ORDER — LIDOCAINE-PRILOCAINE 2.5-2.5 % EX CREA: 1.0000 "application " | TOPICAL_CREAM | CUTANEOUS | Status: DC | PRN

## 2018-09-29 MED ORDER — GUAIFENESIN ER 600 MG PO TB12
600.0000 mg | ORAL_TABLET | Freq: Two times a day (BID) | ORAL | Status: DC
  Administered 2018-09-29 – 2018-10-01 (×5): 600 mg via ORAL
  Filled 2018-09-29 (×5): qty 1

## 2018-09-29 MED ORDER — IPRATROPIUM-ALBUTEROL 0.5-2.5 (3) MG/3ML IN SOLN
3.0000 mL | Freq: Four times a day (QID) | RESPIRATORY_TRACT | Status: DC
  Administered 2018-09-29: 3 mL via RESPIRATORY_TRACT
  Filled 2018-09-29: qty 3

## 2018-09-29 MED ORDER — HEPARIN SODIUM (PORCINE) 1000 UNIT/ML DIALYSIS: 20.0000 [IU]/kg | INTRAMUSCULAR | Status: DC | PRN

## 2018-09-29 MED ORDER — IPRATROPIUM-ALBUTEROL 0.5-2.5 (3) MG/3ML IN SOLN: 3.0000 mL | RESPIRATORY_TRACT | Status: DC | PRN

## 2018-09-29 MED ORDER — HEPARIN SODIUM (PORCINE) 1000 UNIT/ML DIALYSIS: 1000.0000 [IU] | INTRAMUSCULAR | Status: DC | PRN

## 2018-09-29 MED ORDER — LIDOCAINE HCL (PF) 1 % IJ SOLN: 5.0000 mL | INTRAMUSCULAR | Status: DC | PRN

## 2018-09-29 NOTE — Procedures (Signed)
Patient seen on Hemodialysis. QB 400, UF goal 1.4L Treatment adjusted as needed.  Elmarie Shiley MD [facility]. Office # [phone] Pager # [phone] 10:05 AM

## 2018-09-29 NOTE — Progress Notes (Signed)
PROGRESS NOTE        PATIENT DETAILS Name: James Nicholson Age: 52 y.o. Sex: male Date of Birth: 1965-12-16 Admit Date: 09/27/2018 Admitting Physician Samuella Cota, MD MEQ:[id number], Iona Beard, MD  Brief Narrative: Patient is a 52 y.o. male with history of ESRD on HD, Wegener's granulomatosis, insulin-dependent DM-2-presented with shortness of breath and cough-was thought to have pneumonia and admitted to the hospitalist service.  See below for further details.    Subjective: Lying comfortably in bed-continues to cough-some minimal rhonchi all over.  Not yet back to his baseline.  Assessment/Plan: Pneumonia: Overall improved-afebrile but leukocytosis persists-blood cultures negative so far.  Continue antimicrobial therapy-although improved-he is still not yet back to his baseline and does acknowledge shortness of breath with exertion.  Follow.  ESRD: HD MWF-nephrology following and directing care  Insulin-dependent DM-2: CBG stable-continue Levemir and SSI  Schizophrenia: Appears stable-continue risperidone and trazodone  Chronic systolic heart failure: Volume status appears stable-volume is being removed with HD.  History of VF arrest: ICD in place-follows with Dr. Alecia Lemming  DVT Prophylaxis: Prophylactic Heparin   Code Status: Full code   Family Communication: None at bedside  Disposition Plan: Remain inpatient-suspect requires another day or so of hospitalization before discharge.  Antimicrobial agents: Anti-infectives (From admission, onward)   Start     Dose/Rate Route Frequency Ordered Stop   09/28/18 1200  vancomycin (VANCOCIN) IVPB 750 mg/150 ml premix  Status:  Discontinued     750 mg 150 mL/hr over 60 Minutes Intravenous Every M-W-F (Hemodialysis) 09/27/18 1137 09/27/18 1647   09/27/18 1800  cefTRIAXone (ROCEPHIN) 1 g in sodium chloride 0.9 % 100 mL IVPB     1 g 200 mL/hr over 30 Minutes Intravenous Every 24 hours 09/27/18 1647  10/04/18 1759   09/27/18 1700  azithromycin (ZITHROMAX) tablet 500 mg     500 mg Oral Every 24 hours 09/27/18 1647 10/04/18 1659   09/27/18 1145  vancomycin (VANCOCIN) 1,750 mg in sodium chloride 0.9 % 500 mL IVPB     1,750 mg 250 mL/hr over 120 Minutes Intravenous  Once 09/27/18 1131 09/27/18 1345   09/27/18 1145  ceFEPIme (MAXIPIME) 2 g in sodium chloride 0.9 % 100 mL IVPB     2 g 200 mL/hr over 30 Minutes Intravenous  Once 09/27/18 1132 09/27/18 1311   09/27/18 1130  vancomycin (VANCOCIN) 1,500 mg in sodium chloride 0.9 % 500 mL IVPB  Status:  Discontinued     1,500 mg 250 mL/hr over 120 Minutes Intravenous  Once 09/27/18 1119 09/27/18 1131   09/27/18 1050  ceFEPIme (MAXIPIME) 1 g in sodium chloride 0.9 % 100 mL IVPB  Status:  Discontinued     1 g 200 mL/hr over 30 Minutes Intravenous  Once 09/27/18 1038 09/27/18 1132      Procedures: None  CONSULTS:  nephrology  Time spent: 25- minutes-Greater than 50% of this time was spent in counseling, explanation of diagnosis, planning of further management, and coordination of care.  MEDICATIONS: Scheduled Meds: . azithromycin  500 mg Oral Q24H  . calcium acetate  1,334 mg Oral TID WC  . Chlorhexidine Gluconate Cloth  6 each Topical Q0600  . ciprofloxacin  2 drop Right Eye Q4H while awake  . gabapentin  300 mg Oral BID  . heparin injection (subcutaneous)  5,000 Units Subcutaneous Q8H  . insulin detemir  5  Units Subcutaneous QHS  . metoprolol succinate  50 mg Oral Daily  . pantoprazole  40 mg Oral Daily  . risperiDONE  0.25 mg Oral QHS  . sodium chloride flush  3 mL Intravenous Q12H  . traZODone  100 mg Oral QHS   Continuous Infusions: . sodium chloride    . cefTRIAXone (ROCEPHIN)  IV 1 g (09/28/18 1810)   PRN Meds:.sodium chloride, acetaminophen **OR** acetaminophen, ondansetron **OR** ondansetron (ZOFRAN) IV, sodium chloride flush   PHYSICAL EXAM: Vital signs: Vitals:   09/29/18 1115 09/29/18 1145 09/29/18 1200 09/29/18  1220  BP: 115/71 110/66 108/72 115/68  Pulse: 87 88 89 95  Resp:    16  Temp:    (!) 97.5 F (36.4 C)  TempSrc:    Oral  SpO2:    94%  Weight:    78.8 kg  Height:       Filed Weights   09/27/18 1649 09/29/18 0815 09/29/18 1220  Weight: 79.4 kg 80.3 kg 78.8 kg   Body mass index is 25.65 kg/m.   General appearance :Awake, alert, not in any distress. Speech Clear.  Eyes:Pink conjunctiva HEENT: Atraumatic and Normocephalic Neck:  No thyromegaly Resp:Good air entry bilaterally, some scattered rhonchi CVS: S1 S2 regular, no murmurs.  GI: Bowel sounds present, Non tender and not distended with no gaurding, rigidity or rebound.No organomegaly Extremities: B/L Lower Ext shows no edema, both legs are warm to touch Neurology:  speech clear,Non focal, sensation is grossly intact. Musculoskeletal:No digital cyanosis Skin:No Rash, warm and dry Wounds:N/A  I have personally reviewed following labs and imaging studies  LABORATORY DATA: CBC: Recent Labs  Lab 09/27/18 0856 09/29/18 0835  WBC 12.1* 15.1*  NEUTROABS 8.5*  --   HGB 10.7* 9.5*  HCT 35.5* 31.6*  MCV 100.3* 100.6*  PLT 240 063    Basic Metabolic Panel: Recent Labs  Lab 09/27/18 0856 09/29/18 0831  NA 139 138  K 4.7 4.8  CL 99 103  CO2 23 17*  GLUCOSE 88 102*  BUN 54* 86*  CREATININE 10.18* 14.30*  CALCIUM 9.6 9.4  PHOS  --  4.9*    GFR: Estimated Creatinine Clearance: 6.1 mL/min (A) (by C-G formula based on SCr of 14.3 mg/dL (H)).  Liver Function Tests: Recent Labs  Lab 09/27/18 0856 09/29/18 0831  AST 12*  --   ALT 11  --   ALKPHOS 55  --   BILITOT 1.1  --   PROT 7.8  --   ALBUMIN 3.2* 2.7*   No results for input(s): LIPASE, AMYLASE in the last 168 hours. No results for input(s): AMMONIA in the last 168 hours.  Coagulation Profile: No results for input(s): INR, PROTIME in the last 168 hours.  Cardiac Enzymes: No results for input(s): CKTOTAL, CKMB, CKMBINDEX, TROPONINI in the last 168  hours.  BNP (last 3 results) No results for input(s): PROBNP in the last 8760 hours.  HbA1C: No results for input(s): HGBA1C in the last 72 hours.  CBG: Recent Labs  Lab 09/27/18 2126 09/28/18 2127 09/29/18 0801 09/29/18 1252  GLUCAP 138* 134* 108* 82    Lipid Profile: No results for input(s): CHOL, HDL, LDLCALC, TRIG, CHOLHDL, LDLDIRECT in the last 72 hours.  Thyroid Function Tests: No results for input(s): TSH, T4TOTAL, FREET4, T3FREE, THYROIDAB in the last 72 hours.  Anemia Panel: No results for input(s): VITAMINB12, FOLATE, FERRITIN, TIBC, IRON, RETICCTPCT in the last 72 hours.  Urine analysis:    Component Value Date/Time   COLORURINE RED (  A) 07/06/2016 1938   APPEARANCEUR TURBID (A) 07/06/2016 1938   LABSPEC 1.020 07/06/2016 1938   PHURINE 7.5 07/06/2016 1938   GLUCOSEU NEGATIVE 07/06/2016 1938   HGBUR LARGE (A) 07/06/2016 1938   BILIRUBINUR NEGATIVE 07/06/2016 1938   KETONESUR NEGATIVE 07/06/2016 1938   PROTEINUR >300 (A) 07/06/2016 1938   UROBILINOGEN 0.2 11/04/2014 0952   NITRITE NEGATIVE 07/06/2016 1938   LEUKOCYTESUR SMALL (A) 07/06/2016 1938    Sepsis Labs: Lactic Acid, Venous    Component Value Date/Time   LATICACIDVEN 1.01 07/06/2016 1804    MICROBIOLOGY: Recent Results (from the past 240 hour(s))  Culture, blood (routine x 2) Call MD if unable to obtain prior to antibiotics being given     Status: None (Preliminary result)   Collection Time: 09/27/18  5:04 PM  Result Value Ref Range Status   Specimen Description BLOOD BLOOD LEFT ARM  Final   Special Requests   Final    BOTTLES DRAWN AEROBIC ONLY Blood Culture adequate volume   Culture   Final    NO GROWTH < 24 HOURS Performed at [facility], [place] [address], [place], [place] [address]    Report Status PENDING  Incomplete  Culture, blood (routine x 2) Call MD if unable to obtain prior to antibiotics being given     Status: None (Preliminary result)   Collection Time: 09/27/18  5:04  PM  Result Value Ref Range Status   Specimen Description BLOOD THUMB  Final   Special Requests   Final    BOTTLES DRAWN AEROBIC ONLY Blood Culture adequate volume   Culture   Final    NO GROWTH < 24 HOURS Performed at [facility], [address] [place], [place] [address]    Report Status PENDING  Incomplete    RADIOLOGY STUDIES/RESULTS: Dg Chest 2 View  Result Date: 09/27/2018 CLINICAL DATA:  Chest pain and shortness of breath.  Cough. EXAM: CHEST - 2 VIEW COMPARISON:  11/03/2014 FINDINGS: Stable position of the cardiac pacemaker. Enlarged cardiac silhouette.  Mediastinal contours appear intact. There is no evidence of focal pleural effusion or pneumothorax. Elevation of the right hemidiaphragm likely due to volume loss in the right hemithorax. Subtle peribronchial bilateral lower lobe opacities. Osseous structures are without acute abnormality. Soft tissues are grossly normal. IMPRESSION: Bilateral peribronchial lower lobe airspace opacities which may represent atelectasis or airspace consolidation. Electronically Signed   By: Fidela Salisbury M.D.   On: 09/27/2018 10:00     LOS: 1 day   Oren Binet, MD  [facility]  If 7PM-7AM, please contact night-coverage  Please page via [email or web address] [email or web address]-click on MD name and type text message  09/29/2018, 1:23 PM

## 2018-09-29 NOTE — Evaluation (Signed)
Physical Therapy Evaluation Patient Details Name: James Nicholson MRN: [id number] DOB: [birth year] Today's Date: 09/29/2018   History of Present Illness  52 y.o. male admitted on 09/27/18 for SOB, cough.  Pt dx with PNA with persistent lukocytosis.  Pt with significant PMH of ESRD (HD on MWF), DM2, schizophrenia, chronic systolic heart failure, h/o VF arrest with ICD placed, Wegener's disease (pulmonary), A-fib, and anemia.  Clinical Impression  Pt with mildly staggering gait pattern, but has not been up much in the hospital.  He has broken [place] and is a bit difficult to communicate what his home set up is at baseline.  PT to follow acutely for deficits listed below.      Follow Up Recommendations No PT follow up    Equipment Recommendations  None recommended by PT    Recommendations for Other Services   NA    Precautions / Restrictions Precautions Precautions: Fall Precaution Comments: mildly unsteady on his feet.       Mobility  Bed Mobility Overal bed mobility: Independent                Transfers Overall transfer level: Needs assistance   Transfers: Sit to/from Stand Sit to Stand: Min guard         General transfer comment:  min guard assist for safety  Ambulation/Gait Ambulation/Gait assistance: Min guard Gait Distance (Feet): 300 Feet Assistive device: None Gait Pattern/deviations: Step-through pattern;Staggering left;Staggering right Gait velocity: decreased Gait velocity interpretation: 1.31 - 2.62 ft/sec, indicative of limited community ambulator General Gait Details: pt with mildly staggering gait pattern, reporting painful feet         Balance Overall balance assessment: Needs assistance Sitting-balance support: Feet supported;No upper extremity supported Sitting balance-Leahy Scale: Good     Standing balance support: No upper extremity supported;Bilateral upper extremity supported;Single extremity supported Standing balance-Leahy  Scale: Fair                               Pertinent Vitals/Pain Pain Assessment: Faces Faces Pain Scale: Hurts even more Pain Location: L wrist Pain Descriptors / Indicators: Grimacing;Guarding Pain Intervention(s): Limited activity within patient's tolerance;Monitored during session;Repositioned    Home Living Family/patient expects to be discharged to:: Unsure                      Prior Function Level of Independence: Independent         Comments: Pt reports friends take him to HD, he has a brother close     Hand Dominance   Dominant Hand: Right    Extremity/Trunk Assessment   Upper Extremity Assessment Upper Extremity Assessment: LUE deficits/detail LUE Deficits / Details: left wrist is swollen, painful to the touch, not red.    Lower Extremity Assessment Lower Extremity Assessment: Generalized weakness    Cervical / Trunk Assessment Cervical / Trunk Assessment: Normal  Communication   Communication: Prefers language other than English(Serbian)  Cognition Arousal/Alertness: Awake/alert Behavior During Therapy: WFL for tasks assessed/performed Overall Cognitive Status: No family/caregiver present to determine baseline cognitive functioning                                 General Comments: No one present to report baseline or confirm what he is telling in broken [place]             Assessment/Plan    PT Assessment  Patient needs continued PT services  PT Problem List Decreased strength;Decreased activity tolerance;Decreased balance;Decreased mobility;Decreased knowledge of use of DME;Decreased knowledge of precautions;Pain       PT Treatment Interventions DME instruction;Gait training;Stair training;Therapeutic activities;Functional mobility training;Therapeutic exercise;Balance training;Patient/family education;Modalities    PT Goals (Current goals can be found in the Care Plan section)  Acute Rehab PT Goals Patient  Stated Goal: to get better PT Goal Formulation: With patient Time For Goal Achievement: 10/13/18 Potential to Achieve Goals: Good    Frequency Min 3X/week           AM-PAC PT "6 Clicks" Mobility  Outcome Measure Help needed turning from your back to your side while in a flat bed without using bedrails?: None Help needed moving from lying on your back to sitting on the side of a flat bed without using bedrails?: None Help needed moving to and from a bed to a chair (including a wheelchair)?: A Little Help needed standing up from a chair using your arms (e.g., wheelchair or bedside chair)?: A Little Help needed to walk in hospital room?: A Little Help needed climbing 3-5 steps with a railing? : A Little 6 Click Score: 20    End of Session Equipment Utilized During Treatment: Gait belt Activity Tolerance: Patient limited by pain Patient left: in chair;with call bell/phone within reach;with chair alarm set   PT Visit Diagnosis: Muscle weakness (generalized) (M62.81);Difficulty in walking, not elsewhere classified (R26.2);Pain Pain - Right/Left: Left Pain - part of body: Arm    Time: 4975-3005 PT Time Calculation (min) (ACUTE ONLY): 17 min   Charges:         [name] Guiles B. Traeh Milroy, PT, DPT  Acute Rehabilitation #[phone] pager #[phone] office   PT Evaluation $PT Eval Moderate Complexity: 1 Mod          09/29/2018, 5:43 PM

## 2018-09-29 NOTE — Progress Notes (Signed)
Patient ID: James Nicholson, male   DOB: 10-20-1966, 52 y.o.   MRN: [id number]  [facility] Progress Note   Assessment/ Plan:   1.  Community-acquired pneumonia: Antibiotic therapy narrowed down now to ceftriaxone and azithromycin, he has remained afebrile overnight with some improvement of pleuritic pain. 2. ESRD: He is usually on a Monday/Wednesday/Friday hemodialysis schedule but could not undergo dialysis yesterday due to patient scheduling.  He will get hemodialysis today. 3. Anemia: Denies overt loss, hemoglobin/hematocrit currently at goal. 4. CKD-MBD: On calcium acetate for phosphorus binding we will obtain records of VDR A/cinacalcet. 5. Nutrition: Continue renal diet, start MVI 6. Hypertension: Blood pressures within acceptable range, mild hypervolemia noted on physical exam-monitor with dialysis.  Subjective:   Reports to be feeling fair-chest pain improving   Objective:   BP 136/72 (BP Location: Left Arm)   Pulse (!) 104   Temp 98.9 F (37.2 C) (Oral)   Resp 16   Ht 5\' 9"  (1.753 m)   Wt 79.4 kg   SpO2 97%   BMI 25.85 kg/m   Physical Exam: Gen: Comfortably resting in bed CVS: Pulse regular tachycardia, S1 and S2 normal Resp: Fine basilar rales bilaterally Abd: Soft, obese, nontender Ext: Trace lower extremity edema, right forearm RCF  Labs: BMET Recent Labs  Lab 09/27/18 0856  NA 139  K 4.7  CL 99  CO2 23  GLUCOSE 88  BUN 54*  CREATININE 10.18*  CALCIUM 9.6   CBC Recent Labs  Lab 09/27/18 0856  WBC 12.1*  NEUTROABS 8.5*  HGB 10.7*  HCT 35.5*  MCV 100.3*  PLT 240   Medications:    . azithromycin  500 mg Oral Q24H  . calcium acetate  1,334 mg Oral TID WC  . Chlorhexidine Gluconate Cloth  6 each Topical Q0600  . ciprofloxacin  2 drop Right Eye Q4H while awake  . gabapentin  300 mg Oral BID  . heparin injection (subcutaneous)  5,000 Units Subcutaneous Q8H  . insulin detemir  5 Units Subcutaneous QHS  . metoprolol succinate  50  mg Oral Daily  . pantoprazole  40 mg Oral Daily  . risperiDONE  0.25 mg Oral QHS  . sodium chloride flush  3 mL Intravenous Q12H  . traZODone  100 mg Oral QHS   Elmarie Shiley, MD 09/29/2018, 7:53 AM

## 2018-09-29 NOTE — Progress Notes (Signed)
PT Cancellation Note  Patient Details Name: James Nicholson MRN: [id number] DOB: 07/20/1966   Cancelled Treatment:    Reason Eval/Treat Not Completed: Patient at procedure or test/unavailable.  Pt is in HD.  PT will check back later as time allows.  Thanks,  Barbarann Ehlers. Gerad Cornelio, PT, DPT  Acute Rehabilitation [phone] pager [phone]) [phone] office     [name] Guiles B Bricyn Labrada 09/29/2018, 10:37 AM

## 2018-09-30 DIAGNOSIS — B59 Pneumocystosis: Secondary | ICD-10-CM

## 2018-09-30 DIAGNOSIS — N186 End stage renal disease: Secondary | ICD-10-CM

## 2018-09-30 LAB — GLUCOSE, CAPILLARY
Glucose-Capillary: 93 mg/dL (ref 70–99)
Glucose-Capillary: 107 mg/dL — ABNORMAL HIGH (ref 70–99)
Glucose-Capillary: 112 mg/dL — ABNORMAL HIGH (ref 70–99)
Glucose-Capillary: 110 mg/dL — ABNORMAL HIGH (ref 70–99)

## 2018-09-30 MED ORDER — ALTEPLASE 2 MG IJ SOLR
2.0000 mg | Freq: Once | INTRAMUSCULAR | Status: DC | PRN
  Filled 2018-09-30: qty 2

## 2018-09-30 NOTE — Progress Notes (Signed)
Patient ID: James Nicholson, male   DOB: June 28, 1966, 52 y.o.   MRN: [id number]  [facility] Progress Note   Assessment/ Plan:   1.  Community-acquired pneumonia: Continues to improve on ceftriaxone/azithromycin-afebrile and with improving cough/shortness of breath/pleuritic chest pain. 2. ESRD: He is usually on a Monday/Wednesday/Friday hemodialysis schedule and will get hemodialysis today. 3. Anemia: Denies overt loss, hemoglobin/hematocrit currently at goal. 4. CKD-MBD: On calcium acetate for phosphorus binding we will obtain records of VDRA/cinacalcet. 5. Nutrition: Continue renal diet, start MVI 6. Hypertension: Blood pressures within acceptable range, will order for hemodialysis again today and attempt to challenge dry weight Subjective:   Reports to be feeling "okay"-still having intermittent cough but denies any shortness of breath   Objective:   BP 109/69 (BP Location: Left Arm)   Pulse 94   Temp 99.4 F (37.4 C)   Resp 20   Ht 5\' 9"  (1.753 m)   Wt 78.8 kg   SpO2 95%   BMI 25.65 kg/m   Physical Exam: Gen: Comfortably resting in bed CVS: Pulse regular rhythm, normal rate, S1 and S2 normal Resp: Coarse/transmitted breath sounds bilaterally without distinct rales Abd: Soft, obese, nontender Ext: Trace lower extremity edema, right forearm RCF  Labs: BMET Recent Labs  Lab 09/27/18 0856 09/29/18 0831  NA 139 138  K 4.7 4.8  CL 99 103  CO2 23 17*  GLUCOSE 88 102*  BUN 54* 86*  CREATININE 10.18* 14.30*  CALCIUM 9.6 9.4  PHOS  --  4.9*   CBC Recent Labs  Lab 09/27/18 0856 09/29/18 0835  WBC 12.1* 15.1*  NEUTROABS 8.5*  --   HGB 10.7* 9.5*  HCT 35.5* 31.6*  MCV 100.3* 100.6*  PLT 240 215   Medications:    . azithromycin  500 mg Oral Q24H  . calcium acetate  1,334 mg Oral TID WC  . Chlorhexidine Gluconate Cloth  6 each Topical Q0600  . ciprofloxacin  2 drop Right Eye Q4H while awake  . gabapentin  300 mg Oral BID  . guaiFENesin  600 mg  Oral BID  . heparin injection (subcutaneous)  5,000 Units Subcutaneous Q8H  . insulin detemir  5 Units Subcutaneous QHS  . metoprolol succinate  50 mg Oral Daily  . pantoprazole  40 mg Oral Daily  . risperiDONE  0.25 mg Oral QHS  . sodium chloride flush  3 mL Intravenous Q12H  . traZODone  100 mg Oral QHS   Elmarie Shiley, MD 09/30/2018, 7:49 AM

## 2018-09-30 NOTE — Progress Notes (Signed)
Donzetta Sprung, RN called and made aware that the patient would be run early Thursday morning 10/02/2015.

## 2018-09-30 NOTE — Progress Notes (Signed)
Physical Therapy Treatment Patient Details Name: James Nicholson MRN: [id number] DOB: 04-Nov-1965 Today's Date: 09/30/2018    History of Present Illness 52 y.o. male admitted on 09/27/18 for SOB, cough.  Pt dx with PNA with persistent lukocytosis.  Pt with significant PMH of ESRD (HD on MWF), DM2, schizophrenia, chronic systolic heart failure, h/o VF arrest with ICD placed, Wegener's disease (pulmonary), A-fib, and anemia.    PT Comments    Pt in bed upon arrival and willing to participate in therapy. Pt coughing blood into napkin upon arrival- RN notified.  Pt participated in gait training without AD and high level balance activities. Pt pleasant throughout session, but speaks broken english and requires increased time to process comands- not sure if it's due to language barrier or congnitive deficits. Pt would benefit from continued PT in order to progress toward stated goals and maximize functional independence. Pt remains appropriate to discharge home based on current functional status.   Follow Up Recommendations  No PT follow up     Equipment Recommendations  None recommended by PT    Recommendations for Other Services       Precautions / Restrictions Precautions Precautions: Fall Precaution Comments: mildly unsteady on his feet.  Restrictions Weight Bearing Restrictions: No    Mobility  Bed Mobility Overal bed mobility: Independent                Transfers Overall transfer level: Needs assistance Equipment used: None Transfers: Sit to/from Stand Sit to Stand: Min guard         General transfer comment:  min guard assist for safety  Ambulation/Gait Ambulation/Gait assistance: Min guard Gait Distance (Feet): 250 Feet Assistive device: None Gait Pattern/deviations: Step-through pattern;Staggering left;Staggering right Gait velocity: decreased   General Gait Details: pt with mildly staggering gait pattern. VC on navigating obstacles in  hallway.   Stairs             Wheelchair Mobility    Modified Rankin (Stroke Patients Only)       Balance Overall balance assessment: Needs assistance Sitting-balance support: Feet supported;No upper extremity supported Sitting balance-Leahy Scale: Good     Standing balance support: No upper extremity supported;Bilateral upper extremity supported;Single extremity supported Standing balance-Leahy Scale: Fair Standing balance comment: Pt able to ambulate without AD, but requires UE support for standing dynamic activities.             High level balance activites: Direction changes;Turns;Head turns;Other (comment)(Tandem walking, toe walking and marching)              Cognition Arousal/Alertness: Awake/alert Behavior During Therapy: WFL for tasks assessed/performed Overall Cognitive Status: No family/caregiver present to determine baseline cognitive functioning                                 General Comments: Pt speaks broken english and requires increased time to process comands- not sure if it's due to language barrier or congnitive deficits.      Exercises Total Joint Exercises Hip ABduction/ADduction: AROM;Right;Left;10 reps;Standing Standing Hip Extension: AROM;Right;Left;10 reps;Standing    General Comments        Pertinent Vitals/Pain Faces Pain Scale: Hurts a little bit Pain Location: R side of abdomen Pain Descriptors / Indicators: Aching Pain Intervention(s): Limited activity within patient's tolerance;Monitored during session;Repositioned    Home Living  Prior Function            PT Goals (current goals can now be found in the care plan section) Acute Rehab PT Goals Patient Stated Goal: to get better PT Goal Formulation: With patient Time For Goal Achievement: 10/13/18 Potential to Achieve Goals: Good Progress towards PT goals: Progressing toward goals    Frequency    Min  3X/week      PT Plan Current plan remains appropriate    Co-evaluation              AM-PAC PT "6 Clicks" Mobility   Outcome Measure  Help needed turning from your back to your side while in a flat bed without using bedrails?: None Help needed moving from lying on your back to sitting on the side of a flat bed without using bedrails?: None Help needed moving to and from a bed to a chair (including a wheelchair)?: A Little Help needed standing up from a chair using your arms (e.g., wheelchair or bedside chair)?: A Little Help needed to walk in hospital room?: A Little Help needed climbing 3-5 steps with a railing? : A Little 6 Click Score: 20    End of Session Equipment Utilized During Treatment: Gait belt Activity Tolerance: Patient tolerated treatment well Patient left: in chair;with call bell/phone within reach;with chair alarm set Nurse Communication: Mobility status PT Visit Diagnosis: Muscle weakness (generalized) (M62.81);Difficulty in walking, not elsewhere classified (R26.2);Pain Pain - Right/Left: Left Pain - part of body: Arm     Time: 7408-1448 PT Time Calculation (min) (ACUTE ONLY): 23 min  Charges:  $Gait Training: 8-22 mins $Therapeutic Activity: 8-22 mins                     [address] SPTA    [place] 09/30/2018, 5:28 PM

## 2018-09-30 NOTE — Progress Notes (Addendum)
PROGRESS NOTE        PATIENT DETAILS Name: James Nicholson Age: 52 y.o. Sex: male Date of Birth: 1966/05/01 Admit Date: 09/27/2018 Admitting Physician Samuella Cota, MD [id number], Iona Beard, MD  Brief Narrative: Patient is a 52 y.o. male with history of ESRD on HD, Wegener's granulomatosis, insulin-dependent DM-2-presented with shortness of breath and cough-was thought to have pneumonia and admitted to the hospitalist service.  See below for further details.    Subjective: She reports some cough, reports less productive, but still reports significant congestion.  Assessment/Plan:  Pneumonia:  -Remains afebrile, still with some leukocytosis, reports he feels improved, but not back to baseline yet, still reports some congestion, hard to produce, will start on incentive spirometry, flutter valve, continue with Mucinex . -culture remains  negative, continue with IV Rocephin and azithromycin   ESRD: HD MWF-nephrology following and directing care, he will be dialyzed today, renal to attempt to challenge dry weight  Insulin-dependent DM-2: CBG stable-continue Levemir and SSI  Schizophrenia: Appears stable-continue risperidone and trazodone  Chronic systolic heart failure: Volume status appears stable-volume is being removed with HD.  History of VF arrest: ICD in place-follows with Dr. Alecia Lemming  DVT Prophylaxis: Prophylactic Heparin   Code Status: Full code   Family Communication: None at bedside  Disposition Plan: Remain inpatient-suspect requires another day or so of hospitalization before discharge.  Antimicrobial agents: Anti-infectives (From admission, onward)   Start     Dose/Rate Route Frequency Ordered Stop   09/28/18 1200  vancomycin (VANCOCIN) IVPB 750 mg/150 ml premix  Status:  Discontinued     750 mg 150 mL/hr over 60 Minutes Intravenous Every M-W-F (Hemodialysis) 09/27/18 1137 09/27/18 1647   09/27/18 1800  cefTRIAXone  (ROCEPHIN) 1 g in sodium chloride 0.9 % 100 mL IVPB     1 g 200 mL/hr over 30 Minutes Intravenous Every 24 hours 09/27/18 1647 10/04/18 1759   09/27/18 1700  azithromycin (ZITHROMAX) tablet 500 mg     500 mg Oral Every 24 hours 09/27/18 1647 10/04/18 1659   09/27/18 1145  vancomycin (VANCOCIN) 1,750 mg in sodium chloride 0.9 % 500 mL IVPB     1,750 mg 250 mL/hr over 120 Minutes Intravenous  Once 09/27/18 1131 09/27/18 1345   09/27/18 1145  ceFEPIme (MAXIPIME) 2 g in sodium chloride 0.9 % 100 mL IVPB     2 g 200 mL/hr over 30 Minutes Intravenous  Once 09/27/18 1132 09/27/18 1311   09/27/18 1130  vancomycin (VANCOCIN) 1,500 mg in sodium chloride 0.9 % 500 mL IVPB  Status:  Discontinued     1,500 mg 250 mL/hr over 120 Minutes Intravenous  Once 09/27/18 1119 09/27/18 1131   09/27/18 1050  ceFEPIme (MAXIPIME) 1 g in sodium chloride 0.9 % 100 mL IVPB  Status:  Discontinued     1 g 200 mL/hr over 30 Minutes Intravenous  Once 09/27/18 1038 09/27/18 1132      Procedures: None  CONSULTS:  nephrology  Time spent: 25- minutes-Greater than 50% of this time was spent in counseling, explanation of diagnosis, planning of further management, and coordination of care.  MEDICATIONS: Scheduled Meds: . azithromycin  500 mg Oral Q24H  . calcium acetate  1,334 mg Oral TID WC  . Chlorhexidine Gluconate Cloth  6 each Topical Q0600  . ciprofloxacin  2 drop Right Eye Q4H while awake  .  gabapentin  300 mg Oral BID  . guaiFENesin  600 mg Oral BID  . heparin injection (subcutaneous)  5,000 Units Subcutaneous Q8H  . insulin detemir  5 Units Subcutaneous QHS  . metoprolol succinate  50 mg Oral Daily  . pantoprazole  40 mg Oral Daily  . risperiDONE  0.25 mg Oral QHS  . sodium chloride flush  3 mL Intravenous Q12H  . traZODone  100 mg Oral QHS   Continuous Infusions: . sodium chloride    . cefTRIAXone (ROCEPHIN)  IV 1 g (09/29/18 1813)   PRN Meds:.sodium chloride, acetaminophen **OR** acetaminophen,  alteplase, ipratropium-albuterol, ondansetron **OR** ondansetron (ZOFRAN) IV, sodium chloride flush   PHYSICAL EXAM: Vital signs: Vitals:   09/29/18 1440 09/29/18 1650 09/30/18 0001 09/30/18 0815  BP:  98/65 109/69 (!) 141/86  Pulse:  (!) 105 94 (!) 115  Resp:   20 16  Temp:  98.3 F (36.8 C) 99.4 F (37.4 C) 98.4 F (36.9 C)  TempSrc:      SpO2: 96% 97% 95% 96%  Weight:      Height:       Filed Weights   09/27/18 1649 09/29/18 0815 09/29/18 1220  Weight: 79.4 kg 80.3 kg 78.8 kg   Body mass index is 25.65 kg/m.   Awake Alert, Oriented X 3, No new F.N deficits, Normal affect Symmetrical Chest wall movement, Good air movement bilaterally, some scattered rhonchi RRR,No Gallops,Rubs or new Murmurs, No Parasternal Heave +ve B.Sounds, Abd Soft, No tenderness, No rebound - guarding or rigidity. No Cyanosis, Clubbing or edema, No new Rash or bruise     I have personally reviewed following labs and imaging studies  LABORATORY DATA: CBC: Recent Labs  Lab 09/27/18 0856 09/29/18 0835  WBC 12.1* 15.1*  NEUTROABS 8.5*  --   HGB 10.7* 9.5*  HCT 35.5* 31.6*  MCV 100.3* 100.6*  PLT 240 466    Basic Metabolic Panel: Recent Labs  Lab 09/27/18 0856 09/29/18 0831  NA 139 138  K 4.7 4.8  CL 99 103  CO2 23 17*  GLUCOSE 88 102*  BUN 54* 86*  CREATININE 10.18* 14.30*  CALCIUM 9.6 9.4  PHOS  --  4.9*    GFR: Estimated Creatinine Clearance: 6.1 mL/min (A) (by C-G formula based on SCr of 14.3 mg/dL (H)).  Liver Function Tests: Recent Labs  Lab 09/27/18 0856 09/29/18 0831  AST 12*  --   ALT 11  --   ALKPHOS 55  --   BILITOT 1.1  --   PROT 7.8  --   ALBUMIN 3.2* 2.7*   No results for input(s): LIPASE, AMYLASE in the last 168 hours. No results for input(s): AMMONIA in the last 168 hours.  Coagulation Profile: No results for input(s): INR, PROTIME in the last 168 hours.  Cardiac Enzymes: No results for input(s): CKTOTAL, CKMB, CKMBINDEX, TROPONINI in the last  168 hours.  BNP (last 3 results) No results for input(s): PROBNP in the last 8760 hours.  HbA1C: No results for input(s): HGBA1C in the last 72 hours.  CBG: Recent Labs  Lab 09/29/18 1252 09/29/18 1651 09/29/18 2125 09/30/18 0815 09/30/18 1141  GLUCAP 82 132* 118* 93 107*    Lipid Profile: No results for input(s): CHOL, HDL, LDLCALC, TRIG, CHOLHDL, LDLDIRECT in the last 72 hours.  Thyroid Function Tests: No results for input(s): TSH, T4TOTAL, FREET4, T3FREE, THYROIDAB in the last 72 hours.  Anemia Panel: No results for input(s): VITAMINB12, FOLATE, FERRITIN, TIBC, IRON, RETICCTPCT in the last  72 hours.  Urine analysis:    Component Value Date/Time   COLORURINE RED (A) 07/06/2016 1938   APPEARANCEUR TURBID (A) 07/06/2016 1938   LABSPEC 1.020 07/06/2016 1938   PHURINE 7.5 07/06/2016 1938   GLUCOSEU NEGATIVE 07/06/2016 1938   HGBUR LARGE (A) 07/06/2016 1938   BILIRUBINUR NEGATIVE 07/06/2016 1938   KETONESUR NEGATIVE 07/06/2016 1938   PROTEINUR >300 (A) 07/06/2016 1938   UROBILINOGEN 0.2 11/04/2014 0952   NITRITE NEGATIVE 07/06/2016 1938   LEUKOCYTESUR SMALL (A) 07/06/2016 1938    Sepsis Labs: Lactic Acid, Venous    Component Value Date/Time   LATICACIDVEN 1.01 07/06/2016 1804    MICROBIOLOGY: Recent Results (from the past 240 hour(s))  Culture, blood (routine x 2) Call MD if unable to obtain prior to antibiotics being given     Status: None (Preliminary result)   Collection Time: 09/27/18  5:04 PM  Result Value Ref Range Status   Specimen Description BLOOD BLOOD LEFT ARM  Final   Special Requests   Final    BOTTLES DRAWN AEROBIC ONLY Blood Culture adequate volume   Culture   Final    NO GROWTH 3 DAYS Performed at [facility], [place] [address], [place], [place] [address]    Report Status PENDING  Incomplete  Culture, blood (routine x 2) Call MD if unable to obtain prior to antibiotics being given     Status: None (Preliminary result)   Collection  Time: 09/27/18  5:04 PM  Result Value Ref Range Status   Specimen Description BLOOD THUMB  Final   Special Requests   Final    BOTTLES DRAWN AEROBIC ONLY Blood Culture adequate volume   Culture   Final    NO GROWTH 3 DAYS Performed at [facility], [address] [place],  [address]    Report Status PENDING  Incomplete    RADIOLOGY STUDIES/RESULTS: Dg Chest 2 View  Result Date: 09/27/2018 CLINICAL DATA:  Chest pain and shortness of breath.  Cough. EXAM: CHEST - 2 VIEW COMPARISON:  11/03/2014 FINDINGS: Stable position of the cardiac pacemaker. Enlarged cardiac silhouette.  Mediastinal contours appear intact. There is no evidence of focal pleural effusion or pneumothorax. Elevation of the right hemidiaphragm likely due to volume loss in the right hemithorax. Subtle peribronchial bilateral lower lobe opacities. Osseous structures are without acute abnormality. Soft tissues are grossly normal. IMPRESSION: Bilateral peribronchial lower lobe airspace opacities which may represent atelectasis or airspace consolidation. Electronically Signed   By: Fidela Salisbury M.D.   On: 09/27/2018 10:00     LOS: 2 days   Phillips Climes, MD  [facility]  If 7PM-7AM, please contact night-coverage  Please page via [email or web address] [email or web address]-click on MD name and type text message  09/30/2018, 1:04 PM

## 2018-10-01 DIAGNOSIS — J189 Pneumonia, unspecified organism: Principal | ICD-10-CM

## 2018-10-01 DIAGNOSIS — H1031 Unspecified acute conjunctivitis, right eye: Secondary | ICD-10-CM

## 2018-10-01 DIAGNOSIS — E119 Type 2 diabetes mellitus without complications: Secondary | ICD-10-CM

## 2018-10-01 DIAGNOSIS — Z794 Long term (current) use of insulin: Secondary | ICD-10-CM

## 2018-10-01 LAB — RENAL FUNCTION PANEL
Albumin: 2.6 g/dL — ABNORMAL LOW (ref 3.5–5.0)
Anion gap: 18 — ABNORMAL HIGH (ref 5–15)
BUN: 73 mg/dL — ABNORMAL HIGH (ref 6–20)
CO2: 22 mmol/L (ref 22–32)
Calcium: 9.4 mg/dL (ref 8.9–10.3)
Chloride: 98 mmol/L (ref 98–111)
Creatinine, Ser: 11.81 mg/dL — ABNORMAL HIGH (ref 0.61–1.24)
GFR calc Af Amer: 5 mL/min — ABNORMAL LOW (ref >60)
GFR calc non Af Amer: 4 mL/min — ABNORMAL LOW (ref >60)
Glucose, Bld: 99 mg/dL (ref 70–99)
Phosphorus: 5.1 mg/dL — ABNORMAL HIGH (ref 2.5–4.6)
Potassium: 4.3 mmol/L (ref 3.5–5.1)
Sodium: 138 mmol/L (ref 135–145)

## 2018-10-01 LAB — CBC
HCT: 30.4 % — ABNORMAL LOW (ref 39.0–52.0)
HCT: 31 % — ABNORMAL LOW (ref 39.0–52.0)
Hemoglobin: 9.2 g/dL — ABNORMAL LOW (ref 13.0–17.0)
Hemoglobin: 9.4 g/dL — ABNORMAL LOW (ref 13.0–17.0)
MCH: 29.5 pg (ref 26.0–34.0)
MCH: 29.8 pg (ref 26.0–34.0)
MCHC: 30.3 g/dL (ref 30.0–36.0)
MCHC: 30.3 g/dL (ref 30.0–36.0)
MCV: 97.4 fL (ref 80.0–100.0)
MCV: 98.4 fL (ref 80.0–100.0)
Platelets: 275 10*3/uL (ref 150–400)
Platelets: 269 10*3/uL (ref 150–400)
RBC: 3.12 MIL/uL — ABNORMAL LOW (ref 4.22–5.81)
RBC: 3.15 MIL/uL — ABNORMAL LOW (ref 4.22–5.81)
RDW: 14.6 % (ref 11.5–15.5)
RDW: 14.6 % (ref 11.5–15.5)
WBC: 11.2 10*3/uL — ABNORMAL HIGH (ref 4.0–10.5)
WBC: 11.7 10*3/uL — ABNORMAL HIGH (ref 4.0–10.5)
nRBC: 0 % (ref 0.0–0.2)
nRBC: 0 % (ref 0.0–0.2)

## 2018-10-01 MED ORDER — CEFDINIR 300 MG PO CAPS: 300.0000 mg | ORAL_CAPSULE | ORAL | 0 refills | Status: DC

## 2018-10-01 MED ORDER — HEPARIN SODIUM (PORCINE) 1000 UNIT/ML IJ SOLN
INTRAMUSCULAR | Status: AC
  Administered 2018-10-01: 3200 [IU] via INTRAVENOUS_CENTRAL
  Filled 2018-10-01: qty 4

## 2018-10-01 MED ORDER — CIPROFLOXACIN HCL 0.3 % OP SOLN: 1.0000 [drp] | OPHTHALMIC | 0 refills | Status: DC

## 2018-10-01 MED ORDER — ACETAMINOPHEN 325 MG PO TABS
ORAL_TABLET | ORAL | Status: AC
  Filled 2018-10-01: qty 2

## 2018-10-01 MED ORDER — AZITHROMYCIN 500 MG PO TABS: 500.0000 mg | ORAL_TABLET | Freq: Every day | ORAL | 0 refills | Status: DC

## 2018-10-01 MED ORDER — HEPARIN SODIUM (PORCINE) 1000 UNIT/ML DIALYSIS
40.0000 [IU]/kg | INTRAMUSCULAR | Status: DC | PRN
  Administered 2018-10-01: 3200 [IU] via INTRAVENOUS_CENTRAL
  Filled 2018-10-01 (×2): qty 4

## 2018-10-01 MED FILL — CEFDINIR 300 MG CAPSULE: 300 | 2 days supply | Qty: 2 | Fill #0

## 2018-10-01 MED FILL — CIPROFLOXACIN 0.3% EYE DROP: 0.3 | 5 days supply | Qty: 3 | Fill #0

## 2018-10-01 MED FILL — AZITHROMYCIN 500 MG TABLET: 500 | 3 days supply | Qty: 3 | Fill #0

## 2018-10-01 NOTE — Clinical Social Work Note (Addendum)
CSW confirmed with pt's brother that pt is from [place]. Per previous notes from previous admission pt is from [facility] [address] explained to pt's brother that pt is d/c and pt's brother will be here in about a hour to take pt back to group home. CSW has attempted several times to reach group home. Pt can not dc without group home being aware. CSW to continue to try to reach someone.  2:59 pt's brother arrived at hospital and called Mardene Celeste with the [facility]. CSW spoke with Mardene Celeste and states pt brother is taking pt home for the night and bringing pt to group home tomorrow--pt's brother confirmed. Mardene Celeste states if pt's brother just brings after visit summary tomorrow when he brings pt that that is fine and RN does not need to call report. RN updated. Pt is waiting for his eye drops and antibiotic. Pharmacy will deliver them to the room then pt's brother will take pt home. Clinical Social Worker will sign off for now as social work intervention is no longer needed. Please consult [place] again if new need arises.     [place], [place]'

## 2018-10-01 NOTE — Discharge Summary (Signed)
James Nicholson, is a 52 y.o. male  DOB 1966/09/29  MRN [id number].  Admission date:  09/27/2018  Admitting Physician  Samuella Cota, MD  Discharge Date:  10/01/2018   Primary MD  [name], MD  Recommendations for primary care physician for things to follow:  -Follow with ophthalmology as an outpatient if no improvement in right eye conjunctivitis   Admission Diagnosis  HCAP (healthcare-associated pneumonia) [J18.9]   Discharge Diagnosis  HCAP (healthcare-associated pneumonia) [J18.9]    Principal Problem:   Pneumonia Active Problems:   Anemia in chronic kidney disease   Wegener's disease, pulmonary ([place])   End stage renal disease ([place])   IDDM (insulin dependent diabetes mellitus) ([place])   Conjunctivitis      Past Medical History:  Diagnosis Date  . Anemia   . Atrial fibrillation   . Cardiomyopathy ([place]) 2010   Unclear Etiology: Last Echo 06/2009: EF 40-45%, severe Lateral & apical Hypokinesis (? CAD) ; Grade 2 DDysfxn. Mild conc LVH.   Marland Kitchen Cellulitis   . Chronic kidney disease (CKD), stage V ([place])    Dialysis Tue, Thurs, Sat  . Dyslipidemia   . Enterobacter sepsis ([place])   . Hyperlipidemia   . Hypertension   . IDDM (insulin dependent diabetes mellitus) ([place]) 11/03/2014  . S/P ICD (internal cardiac defibrillator) procedure 2010   VT Arrest (in [place])  . Schizophrenia ([place])   . Wegener's disease, pulmonary ([place]) 02/05/2013    Past Surgical History:  Procedure Laterality Date  . AV FISTULA PLACEMENT Right 02/10/2013   Procedure: ARTERIOVENOUS (AV) FISTULA CREATION;  Surgeon: Rosetta Posner, MD;  Location: [facility] OR;  Service: Vascular;  Laterality: Right;  Right forearm radial/cephalic arterovenous fistula.   Marland Kitchen CARDIAC CATHETERIZATION  2010   [place]: In setting of VT arrest. Per brother's report, nonobstructive with no intervention  . CARDIAC DEFIBRILLATOR PLACEMENT  2010   [place]  . FISTULOGRAM Right 08/09/2013   Procedure: FISTULOGRAM;  Surgeon: Angelia Mould, MD;  Location: [facility] CATH LAB;  Service: Cardiovascular;  Laterality: Right;  . ICD GENERATOR CHANGEOUT N/A 12/05/2017   Procedure: ICD GENERATOR CHANGEOUT;  Surgeon: Evans Lance, MD;  Location: [facility] CV LAB;  Service: Cardiovascular;  Laterality: N/A;  . LIGATION OF COMPETING BRANCHES OF ARTERIOVENOUS FISTULA Right 08/13/2013   Procedure: LIGATION OF COMPETING BRANCHES X5 OF ARTERIOVENOUS FISTULA- RIGHT ARM;  Surgeon: Serafina Mitchell, MD;  Location: [facility];  Service: Vascular;  Laterality: Right;       History of present illness and  Hospital Course:     Kindly see H&P for history of present illness and admission details, please review complete Labs, Consult reports and Test reports for all details in brief  HPI  from the history and physical done on the day of admission 09/27/2018  52 year old man PMH Wegener's granulomatosis, atrial fibrillation, cardiomyopathy, ESRD, ICD, schizophrenia presented to the emergency department with shortness of breath, cough, vomiting.  Referred for observation for pneumonia.  History obtained from patient as well as brother at bedside  who interprets my preference.  He has had shortness of breath for the last 2 weeks which has been progressive, with increased coughing.  Over the last 24 hours symptoms have worsened with significant coughing and shortness of breath and some chest discomfort from coughing.  He has had vomiting.  No specific aggravating or alleviating factors.  Brother observed him last night and noted him to be in some respiratory distress.  Also noted is a right red eye which has been been red for approximately a month, there is matting in the morning, patient reports intact vision.  With  ED Course: Afebrile, vital signs stable, no hypoxia, treated with DuoNeb, cefepime.  Hospital Course   Patient is a 53 y.o. male with history of ESRD on  HD, Wegener's granulomatosis, insulin-dependent DM-2-presented with shortness of breath and cough-was thought to have pneumonia and admitted to the hospitalist service.  See below for further details.   Pneumonia:  -Remains afebrile, cytosis trending down, seems improved, has improved, less congested, treated with IV Rocephin and azithromycin during hospital stay, he will be discharged on Ceftin ear and azithromycin to finish total 7 days treatment . -Blood cultures remain negative at time of discharge  ESRD: HD MWF-nephrology following and directing care, was dialyzed today, and to continue his dialysis on regular schedule Monday Wednesday Friday .  Insulin-dependent DM-2:  Continue with home regimen on discharge  Schizophrenia: Appears stable-continue risperidone and trazodone  Chronic systolic heart failure: Volume status appears stable-volume is being removed with HD.  History of VF arrest: ICD in place-follows with Dr. Alecia Lemming  Right eye conjunctivitis -We will discharge on ciprofloxacin eyedrop   Discharge Condition:  Stable   Follow UP  Follow-up Information    [name], Iona Beard, MD Follow up in 1 week(s).   Specialty:  Internal Medicine Contact information: [address] [place] [address] [phone]             Discharge Instructions  and  Discharge Medications     Discharge Instructions    Discharge instructions   Complete by:  As directed    Follow with Primary MD [name], MD in 7 days   Get CBC, CMP, checked  by Primary MD next visit.    Activity: As tolerated with Full fall precautions use walker/cane & assistance as needed   Disposition Home    Diet: Renal modified/carb modified with 1200 cc fluid restrictions  For Heart failure patients - Check your Weight same time everyday, if you gain over 2 pounds, or you develop in leg swelling, experience more shortness of breath or chest pain, call your Primary MD  immediately. Follow Cardiac Low Salt Diet and 1.5 lit/day fluid restriction.   On your next visit with your primary care physician please Get Medicines reviewed and adjusted.   Please request your Prim.MD to go over all Hospital Tests and Procedure/Radiological results at the follow up, please get all Hospital records sent to your Prim MD by signing hospital release before you go home.   If you experience worsening of your admission symptoms, develop shortness of breath, life threatening emergency, suicidal or homicidal thoughts you must seek medical attention immediately by calling 911 or calling your MD immediately  if symptoms less severe.  You Must read complete instructions/literature along with all the possible adverse reactions/side effects for all the Medicines you take and that have been prescribed to you. Take any new Medicines after you have completely understood and accpet all the possible  adverse reactions/side effects.   Do not drive, operating heavy machinery, perform activities at heights, swimming or participation in water activities or provide baby sitting services if your were admitted for syncope or siezures until you have seen by Primary MD or a Neurologist and advised to do so again.  Do not drive when taking Pain medications.    Do not take more than prescribed Pain, Sleep and Anxiety Medications  Special Instructions: If you have smoked or chewed Tobacco  in the last 2 yrs please stop smoking, stop any regular Alcohol  and or any Recreational drug use.  Wear Seat belts while driving.   Please note  You were cared for by a hospitalist during your hospital stay. If you have any questions about your discharge medications or the care you received while you were in the hospital after you are discharged, you can call the unit and asked to speak with the hospitalist on call if the hospitalist that took care of you is not available. Once you are discharged, your primary  care physician will handle any further medical issues. Please note that NO REFILLS for any discharge medications will be authorized once you are discharged, as it is imperative that you return to your primary care physician (or establish a relationship with a primary care physician if you do not have one) for your aftercare needs so that they can reassess your need for medications and monitor your lab values.   Increase activity slowly   Complete by:  As directed      Allergies as of 10/01/2018   No Known Allergies     Medication List    TAKE these medications   azithromycin 500 MG tablet Commonly known as:  ZITHROMAX Take 1 tablet (500 mg total) by mouth daily for 3 days. Take 1 tablet daily for 3 days.   calcium acetate 667 MG capsule Commonly known as:  PHOSLO Take 1,334 mg by mouth 3 (three) times daily with meals. 8a, 12p, 5p   cefdinir 300 MG capsule Commonly known as:  OMNICEF Take 1 capsule (300 mg total) by mouth every other day. PLEASE TAKE AFTER HD Start taking on:  10/02/2018   ciprofloxacin 0.3 % ophthalmic solution Commonly known as:  CILOXAN Place 1 drop into the right eye every 4 (four) hours while awake for 5 days.   gabapentin 300 MG capsule Commonly known as:  NEURONTIN Take 300 mg by mouth 2 (two) times daily.   gemfibrozil 600 MG tablet Commonly known as:  LOPID Take 600 mg by mouth 2 (two) times daily before a meal.   insulin detemir 100 UNIT/ML injection Commonly known as:  LEVEMIR Inject 0.05 mLs (5 Units total) into the skin at bedtime.   metoprolol succinate 50 MG 24 hr tablet Commonly known as:  TOPROL-XL Take 50 mg by mouth daily. Take with or immediately following a meal.   omeprazole 20 MG capsule Commonly known as:  PRILOSEC Take 20 mg by mouth every morning.   risperiDONE 0.25 MG tablet Commonly known as:  RISPERDAL Take 0.25 mg by mouth at bedtime.   traZODone 100 MG tablet Commonly known as:  DESYREL Take 100 mg by mouth at  bedtime.         Diet and Activity recommendation: See Discharge Instructions above   Consults obtained -  Renal   Major procedures and Radiology Reports - PLEASE review detailed and final reports for all details, in brief -      Dg Chest  2 View  Result Date: 09/27/2018 CLINICAL DATA:  Chest pain and shortness of breath.  Cough. EXAM: CHEST - 2 VIEW COMPARISON:  11/03/2014 FINDINGS: Stable position of the cardiac pacemaker. Enlarged cardiac silhouette.  Mediastinal contours appear intact. There is no evidence of focal pleural effusion or pneumothorax. Elevation of the right hemidiaphragm likely due to volume loss in the right hemithorax. Subtle peribronchial bilateral lower lobe opacities. Osseous structures are without acute abnormality. Soft tissues are grossly normal. IMPRESSION: Bilateral peribronchial lower lobe airspace opacities which may represent atelectasis or airspace consolidation. Electronically Signed   By: Fidela Salisbury M.D.   On: 09/27/2018 10:00    Micro Results    Recent Results (from the past 240 hour(s))  Culture, blood (routine x 2) Call MD if unable to obtain prior to antibiotics being given     Status: None (Preliminary result)   Collection Time: 09/27/18  5:04 PM  Result Value Ref Range Status   Specimen Description BLOOD BLOOD LEFT ARM  Final   Special Requests   Final    BOTTLES DRAWN AEROBIC ONLY Blood Culture adequate volume   Culture   Final    NO GROWTH 4 DAYS Performed at [facility], [address], [place], [place] [address]    Report Status PENDING  Incomplete  Culture, blood (routine x 2) Call MD if unable to obtain prior to antibiotics being given     Status: None (Preliminary result)   Collection Time: 09/27/18  5:04 PM  Result Value Ref Range Status   Specimen Description BLOOD THUMB  Final   Special Requests   Final    BOTTLES DRAWN AEROBIC ONLY Blood Culture adequate volume   Culture   Final    NO GROWTH 4  DAYS Performed at [facility], [address], [place], [place] [address]    Report Status PENDING  Incomplete       Today   Subjective:   James Nicholson today has no headache,no chest or abdominal pain.  Objective:   Blood pressure 109/73, pulse (!) 110, temperature 98.7 F (37.1 C), temperature source Oral, resp. rate 20, height 5\' 9"  (1.753 m), weight 77 kg, SpO2 92 %.   Intake/Output Summary (Last 24 hours) at 10/01/2018 1330 Last data filed at 10/01/2018 1158 Gross per 24 hour  Intake 425 ml  Output 2000 ml  Net -1575 ml    Exam Awake Alert, Oriented x 3, No new F.N deficits, Normal affect Right eye scleral erythema Symmetrical Chest wall movement, Good air movement bilaterally, CTAB RRR,No Gallops,Rubs or new Murmurs, No Parasternal Heave +ve B.Sounds, Abd Soft, Non tender, No rebound -guarding or rigidity. No Cyanosis, Clubbing or edema, No new Rash or bruise  Data Review   CBC w Diff:  Lab Results  Component Value Date   WBC 11.7 (H) 10/01/2018   HGB 9.4 (L) 10/01/2018   HGB 13.4 11/28/2017   HCT 31.0 (L) 10/01/2018   HCT 38.4 11/28/2017   PLT 269 10/01/2018   PLT 248 11/28/2017   LYMPHOPCT 13 09/27/2018   MONOPCT 9 09/27/2018   EOSPCT 7 09/27/2018   BASOPCT 0 09/27/2018    CMP:  Lab Results  Component Value Date   NA 138 10/01/2018   NA 138 11/28/2017   K 4.3 10/01/2018   CL 98 10/01/2018   CO2 22 10/01/2018   BUN 73 (H) 10/01/2018   BUN 26 (H) 11/28/2017   CREATININE 11.81 (H) 10/01/2018   CREATININE 1.15 05/22/2012  PROT 7.8 09/27/2018   ALBUMIN 2.6 (L) 10/01/2018   BILITOT 1.1 09/27/2018   ALKPHOS 55 09/27/2018   AST 12 (L) 09/27/2018   ALT 11 09/27/2018  .   Total Time in preparing paper work, data evaluation and todays exam - 4 minutes  Phillips Climes M.D on 10/01/2018 at 1:30 PM  [facility]   Office  [phone]

## 2018-10-01 NOTE — Progress Notes (Signed)
Patient seen on Hemodialysis. QB 400, UF goal 3L Complains of right-sided pleuritic chest pain that remains persistent as well as some congestion with cough.  Overall, appears to be doing much better but unfortunately is off schedule with regards to his hemodialysis. May possibly be able to discharge home later today to go to his scheduled hemodialysis tomorrow-his brother earlier inquired about the possibility of placement to a skilled nursing facility  Elmarie Shiley MD [facility]. Office # [phone] Pager # [phone] 8:44 AM

## 2018-10-01 NOTE — Discharge Instructions (Signed)
Follow with Primary MD [name], MD in 7 days   Get CBC, CMP, checked  by Primary MD next visit.    Activity: As tolerated with Full fall precautions use walker/cane & assistance as needed   Disposition Home    Diet: Renal modified/carb modified with 1200 cc fluid restrictions  For Heart failure patients - Check your Weight same time everyday, if you gain over 2 pounds, or you develop in leg swelling, experience more shortness of breath or chest pain, call your Primary MD immediately. Follow Cardiac Low Salt Diet and 1.5 lit/day fluid restriction.   On your next visit with your primary care physician please Get Medicines reviewed and adjusted.   Please request your Prim.MD to go over all Hospital Tests and Procedure/Radiological results at the follow up, please get all Hospital records sent to your Prim MD by signing hospital release before you go home.   If you experience worsening of your admission symptoms, develop shortness of breath, life threatening emergency, suicidal or homicidal thoughts you must seek medical attention immediately by calling 911 or calling your MD immediately  if symptoms less severe.  You Must read complete instructions/literature along with all the possible adverse reactions/side effects for all the Medicines you take and that have been prescribed to you. Take any new Medicines after you have completely understood and accpet all the possible adverse reactions/side effects.   Do not drive, operating heavy machinery, perform activities at heights, swimming or participation in water activities or provide baby sitting services if your were admitted for syncope or siezures until you have seen by Primary MD or a Neurologist and advised to do so again.  Do not drive when taking Pain medications.    Do not take more than prescribed Pain, Sleep and Anxiety Medications  Special Instructions: If you have smoked or chewed Tobacco  in the last 2 yrs please  stop smoking, stop any regular Alcohol  and or any Recreational drug use.  Wear Seat belts while driving.   Please note  You were cared for by a hospitalist during your hospital stay. If you have any questions about your discharge medications or the care you received while you were in the hospital after you are discharged, you can call the unit and asked to speak with the hospitalist on call if the hospitalist that took care of you is not available. Once you are discharged, your primary care physician will handle any further medical issues. Please note that NO REFILLS for any discharge medications will be authorized once you are discharged, as it is imperative that you return to your primary care physician (or establish a relationship with a primary care physician if you do not have one) for your aftercare needs so that they can reassess your need for medications and monitor your lab values.

## 2018-10-02 LAB — CULTURE, BLOOD (ROUTINE X 2)
Culture: NO GROWTH
Culture: NO GROWTH
Special Requests: ADEQUATE
Special Requests: ADEQUATE

## 2018-10-04 ENCOUNTER — Inpatient Hospital Stay (HOSPITAL_COMMUNITY)
Admission: EM | Admit: 2018-10-04 | Discharge: 2018-10-16 | DRG: 193 | Disposition: A | Discharge status: Skilled Nursing Facility | Payer: Medicare Other | Attending: Internal Medicine | Admitting: Internal Medicine

## 2018-10-04 ENCOUNTER — Encounter (HOSPITAL_COMMUNITY): Payer: Self-pay

## 2018-10-04 ENCOUNTER — Emergency Department (HOSPITAL_COMMUNITY): Payer: Medicare Other

## 2018-10-04 ENCOUNTER — Inpatient Hospital Stay (HOSPITAL_COMMUNITY): Payer: Medicare Other

## 2018-10-04 DIAGNOSIS — J9601 Acute respiratory failure with hypoxia: Secondary | ICD-10-CM | POA: Diagnosis present

## 2018-10-04 DIAGNOSIS — M300 Polyarteritis nodosa: Secondary | ICD-10-CM | POA: Diagnosis present

## 2018-10-04 DIAGNOSIS — E1140 Type 2 diabetes mellitus with diabetic neuropathy, unspecified: Secondary | ICD-10-CM | POA: Diagnosis not present

## 2018-10-04 DIAGNOSIS — Z8674 Personal history of sudden cardiac arrest: Secondary | ICD-10-CM | POA: Diagnosis not present

## 2018-10-04 DIAGNOSIS — H109 Unspecified conjunctivitis: Secondary | ICD-10-CM | POA: Diagnosis present

## 2018-10-04 DIAGNOSIS — J189 Pneumonia, unspecified organism: Principal | ICD-10-CM | POA: Diagnosis present

## 2018-10-04 DIAGNOSIS — D487 Neoplasm of uncertain behavior of other specified sites: Secondary | ICD-10-CM | POA: Diagnosis not present

## 2018-10-04 DIAGNOSIS — E1122 Type 2 diabetes mellitus with diabetic chronic kidney disease: Secondary | ICD-10-CM | POA: Diagnosis present

## 2018-10-04 DIAGNOSIS — Z95810 Presence of automatic (implantable) cardiac defibrillator: Secondary | ICD-10-CM

## 2018-10-04 DIAGNOSIS — D72829 Elevated white blood cell count, unspecified: Secondary | ICD-10-CM

## 2018-10-04 DIAGNOSIS — R7611 Nonspecific reaction to tuberculin skin test without active tuberculosis: Secondary | ICD-10-CM | POA: Diagnosis present

## 2018-10-04 DIAGNOSIS — I4891 Unspecified atrial fibrillation: Secondary | ICD-10-CM | POA: Diagnosis present

## 2018-10-04 DIAGNOSIS — I4901 Ventricular fibrillation: Secondary | ICD-10-CM | POA: Diagnosis not present

## 2018-10-04 DIAGNOSIS — M3131 Wegener's granulomatosis with renal involvement: Secondary | ICD-10-CM | POA: Diagnosis not present

## 2018-10-04 DIAGNOSIS — I132 Hypertensive heart and chronic kidney disease with heart failure and with stage 5 chronic kidney disease, or end stage renal disease: Secondary | ICD-10-CM | POA: Diagnosis present

## 2018-10-04 DIAGNOSIS — I429 Cardiomyopathy, unspecified: Secondary | ICD-10-CM | POA: Diagnosis present

## 2018-10-04 DIAGNOSIS — J330 Polyp of nasal cavity: Secondary | ICD-10-CM | POA: Diagnosis not present

## 2018-10-04 DIAGNOSIS — H0520 Unspecified exophthalmos: Secondary | ICD-10-CM | POA: Diagnosis not present

## 2018-10-04 DIAGNOSIS — J9600 Acute respiratory failure, unspecified whether with hypoxia or hypercapnia: Secondary | ICD-10-CM | POA: Diagnosis not present

## 2018-10-04 DIAGNOSIS — Z79899 Other long term (current) drug therapy: Secondary | ICD-10-CM

## 2018-10-04 DIAGNOSIS — D631 Anemia in chronic kidney disease: Secondary | ICD-10-CM | POA: Diagnosis not present

## 2018-10-04 DIAGNOSIS — Z992 Dependence on renal dialysis: Secondary | ICD-10-CM | POA: Diagnosis not present

## 2018-10-04 DIAGNOSIS — R2689 Other abnormalities of gait and mobility: Secondary | ICD-10-CM | POA: Diagnosis not present

## 2018-10-04 DIAGNOSIS — N186 End stage renal disease: Secondary | ICD-10-CM | POA: Diagnosis not present

## 2018-10-04 DIAGNOSIS — R911 Solitary pulmonary nodule: Secondary | ICD-10-CM | POA: Diagnosis not present

## 2018-10-04 DIAGNOSIS — R2681 Unsteadiness on feet: Secondary | ICD-10-CM | POA: Diagnosis not present

## 2018-10-04 DIAGNOSIS — J322 Chronic ethmoidal sinusitis: Secondary | ICD-10-CM | POA: Diagnosis not present

## 2018-10-04 DIAGNOSIS — Z8249 Family history of ischemic heart disease and other diseases of the circulatory system: Secondary | ICD-10-CM

## 2018-10-04 DIAGNOSIS — E785 Hyperlipidemia, unspecified: Secondary | ICD-10-CM | POA: Diagnosis present

## 2018-10-04 DIAGNOSIS — H05811 Cyst of right orbit: Secondary | ICD-10-CM | POA: Diagnosis not present

## 2018-10-04 DIAGNOSIS — M3130 Wegener's granulomatosis without renal involvement: Secondary | ICD-10-CM | POA: Diagnosis not present

## 2018-10-04 DIAGNOSIS — J9690 Respiratory failure, unspecified, unspecified whether with hypoxia or hypercapnia: Secondary | ICD-10-CM

## 2018-10-04 DIAGNOSIS — I10 Essential (primary) hypertension: Secondary | ICD-10-CM | POA: Diagnosis present

## 2018-10-04 DIAGNOSIS — J324 Chronic pansinusitis: Secondary | ICD-10-CM | POA: Diagnosis present

## 2018-10-04 DIAGNOSIS — H05111 Granuloma of right orbit: Secondary | ICD-10-CM | POA: Diagnosis present

## 2018-10-04 DIAGNOSIS — Z7401 Bed confinement status: Secondary | ICD-10-CM | POA: Diagnosis not present

## 2018-10-04 DIAGNOSIS — N2581 Secondary hyperparathyroidism of renal origin: Secondary | ICD-10-CM | POA: Diagnosis present

## 2018-10-04 DIAGNOSIS — H5789 Other specified disorders of eye and adnexa: Secondary | ICD-10-CM | POA: Diagnosis present

## 2018-10-04 DIAGNOSIS — R05 Cough: Secondary | ICD-10-CM | POA: Diagnosis not present

## 2018-10-04 DIAGNOSIS — J329 Chronic sinusitis, unspecified: Secondary | ICD-10-CM | POA: Diagnosis present

## 2018-10-04 DIAGNOSIS — R41841 Cognitive communication deficit: Secondary | ICD-10-CM | POA: Diagnosis not present

## 2018-10-04 DIAGNOSIS — I120 Hypertensive chronic kidney disease with stage 5 chronic kidney disease or end stage renal disease: Secondary | ICD-10-CM | POA: Diagnosis not present

## 2018-10-04 DIAGNOSIS — J320 Chronic maxillary sinusitis: Secondary | ICD-10-CM | POA: Diagnosis not present

## 2018-10-04 DIAGNOSIS — H0589 Other disorders of orbit: Secondary | ICD-10-CM | POA: Diagnosis not present

## 2018-10-04 DIAGNOSIS — K5900 Constipation, unspecified: Secondary | ICD-10-CM | POA: Diagnosis not present

## 2018-10-04 DIAGNOSIS — Z87891 Personal history of nicotine dependence: Secondary | ICD-10-CM

## 2018-10-04 DIAGNOSIS — R918 Other nonspecific abnormal finding of lung field: Secondary | ICD-10-CM | POA: Diagnosis not present

## 2018-10-04 DIAGNOSIS — Y95 Nosocomial condition: Secondary | ICD-10-CM | POA: Diagnosis present

## 2018-10-04 DIAGNOSIS — J019 Acute sinusitis, unspecified: Secondary | ICD-10-CM | POA: Diagnosis not present

## 2018-10-04 DIAGNOSIS — E1142 Type 2 diabetes mellitus with diabetic polyneuropathy: Secondary | ICD-10-CM | POA: Diagnosis present

## 2018-10-04 DIAGNOSIS — Z8701 Personal history of pneumonia (recurrent): Secondary | ICD-10-CM

## 2018-10-04 DIAGNOSIS — M2550 Pain in unspecified joint: Secondary | ICD-10-CM | POA: Diagnosis not present

## 2018-10-04 DIAGNOSIS — F209 Schizophrenia, unspecified: Secondary | ICD-10-CM | POA: Diagnosis present

## 2018-10-04 DIAGNOSIS — Z227 Latent tuberculosis: Secondary | ICD-10-CM | POA: Diagnosis not present

## 2018-10-04 DIAGNOSIS — Z8615 Personal history of latent tuberculosis infection: Secondary | ICD-10-CM | POA: Diagnosis not present

## 2018-10-04 DIAGNOSIS — I4510 Unspecified right bundle-branch block: Secondary | ICD-10-CM | POA: Diagnosis not present

## 2018-10-04 DIAGNOSIS — M6281 Muscle weakness (generalized): Secondary | ICD-10-CM | POA: Diagnosis not present

## 2018-10-04 DIAGNOSIS — Z794 Long term (current) use of insulin: Secondary | ICD-10-CM | POA: Diagnosis not present

## 2018-10-04 DIAGNOSIS — Z9889 Other specified postprocedural states: Secondary | ICD-10-CM | POA: Diagnosis not present

## 2018-10-04 DIAGNOSIS — R930 Abnormal findings on diagnostic imaging of skull and head, not elsewhere classified: Secondary | ICD-10-CM | POA: Diagnosis not present

## 2018-10-04 DIAGNOSIS — J321 Chronic frontal sinusitis: Secondary | ICD-10-CM | POA: Diagnosis not present

## 2018-10-04 DIAGNOSIS — I5022 Chronic systolic (congestive) heart failure: Secondary | ICD-10-CM | POA: Diagnosis present

## 2018-10-04 DIAGNOSIS — R5381 Other malaise: Secondary | ICD-10-CM | POA: Diagnosis not present

## 2018-10-04 DIAGNOSIS — J850 Gangrene and necrosis of lung: Secondary | ICD-10-CM | POA: Diagnosis not present

## 2018-10-04 DIAGNOSIS — J9811 Atelectasis: Secondary | ICD-10-CM | POA: Diagnosis not present

## 2018-10-04 DIAGNOSIS — K567 Ileus, unspecified: Secondary | ICD-10-CM

## 2018-10-04 DIAGNOSIS — I700 Atherosclerosis of aorta: Secondary | ICD-10-CM | POA: Diagnosis present

## 2018-10-04 DIAGNOSIS — R059 Cough, unspecified: Secondary | ICD-10-CM

## 2018-10-04 DIAGNOSIS — I776 Arteritis, unspecified: Secondary | ICD-10-CM | POA: Diagnosis not present

## 2018-10-04 LAB — CBC WITH DIFFERENTIAL/PLATELET
Abs Immature Granulocytes: 0.19 10*3/uL — ABNORMAL HIGH (ref 0.00–0.07)
Basophils Absolute: 0.1 10*3/uL (ref 0.0–0.1)
Basophils Relative: 0 %
Eosinophils Absolute: 1 10*3/uL — ABNORMAL HIGH (ref 0.0–0.5)
Eosinophils Relative: 5 %
HCT: 30.6 % — ABNORMAL LOW (ref 39.0–52.0)
Hemoglobin: 9.2 g/dL — ABNORMAL LOW (ref 13.0–17.0)
Immature Granulocytes: 1 %
Lymphocytes Relative: 8 %
Lymphs Abs: 1.5 10*3/uL (ref 0.7–4.0)
MCH: 30 pg (ref 26.0–34.0)
MCHC: 30.1 g/dL (ref 30.0–36.0)
MCV: 99.7 fL (ref 80.0–100.0)
Monocytes Absolute: 1.3 10*3/uL — ABNORMAL HIGH (ref 0.1–1.0)
Monocytes Relative: 7 %
Neutro Abs: 14.5 10*3/uL — ABNORMAL HIGH (ref 1.7–7.7)
Neutrophils Relative %: 79 %
Platelets: 332 10*3/uL (ref 150–400)
RBC: 3.07 MIL/uL — ABNORMAL LOW (ref 4.22–5.81)
RDW: 14.8 % (ref 11.5–15.5)
WBC: 18.5 10*3/uL — ABNORMAL HIGH (ref 4.0–10.5)
nRBC: 0 % (ref 0.0–0.2)

## 2018-10-04 LAB — I-STAT CG4 LACTIC ACID, ED: Lactic Acid, Venous: 0.97 mmol/L (ref 0.5–1.9)

## 2018-10-04 LAB — COMPREHENSIVE METABOLIC PANEL
ALT: 30 U/L (ref 0–44)
AST: 17 U/L (ref 15–41)
Albumin: 2.6 g/dL — ABNORMAL LOW (ref 3.5–5.0)
Alkaline Phosphatase: 51 U/L (ref 38–126)
Anion gap: 20 — ABNORMAL HIGH (ref 5–15)
BUN: 94 mg/dL — ABNORMAL HIGH (ref 6–20)
CO2: 23 mmol/L (ref 22–32)
Calcium: 9.4 mg/dL (ref 8.9–10.3)
Chloride: 97 mmol/L — ABNORMAL LOW (ref 98–111)
Creatinine, Ser: 15.11 mg/dL — ABNORMAL HIGH (ref 0.61–1.24)
GFR calc Af Amer: 4 mL/min — ABNORMAL LOW (ref >60)
GFR calc non Af Amer: 3 mL/min — ABNORMAL LOW (ref >60)
Glucose, Bld: 116 mg/dL — ABNORMAL HIGH (ref 70–99)
Potassium: 4.8 mmol/L (ref 3.5–5.1)
Sodium: 140 mmol/L (ref 135–145)
Total Bilirubin: 1 mg/dL (ref 0.3–1.2)
Total Protein: 7.7 g/dL (ref 6.5–8.1)

## 2018-10-04 LAB — CBC
HCT: 29.8 % — ABNORMAL LOW (ref 39.0–52.0)
Hemoglobin: 9.3 g/dL — ABNORMAL LOW (ref 13.0–17.0)
MCH: 30.7 pg (ref 26.0–34.0)
MCHC: 31.2 g/dL (ref 30.0–36.0)
MCV: 98.3 fL (ref 80.0–100.0)
Platelets: 313 10*3/uL (ref 150–400)
RBC: 3.03 MIL/uL — ABNORMAL LOW (ref 4.22–5.81)
RDW: 14.8 % (ref 11.5–15.5)
WBC: 17.9 10*3/uL — ABNORMAL HIGH (ref 4.0–10.5)
nRBC: 0 % (ref 0.0–0.2)

## 2018-10-04 LAB — HEMOGLOBIN A1C
Hgb A1c MFr Bld: 4.8 % (ref 4.8–5.6)
Mean Plasma Glucose: 91.06 mg/dL

## 2018-10-04 LAB — CREATININE, SERUM
Creatinine, Ser: 15.51 mg/dL — ABNORMAL HIGH (ref 0.61–1.24)
GFR calc Af Amer: 4 mL/min — ABNORMAL LOW (ref >60)
GFR calc non Af Amer: 3 mL/min — ABNORMAL LOW (ref >60)

## 2018-10-04 LAB — INFLUENZA PANEL BY PCR (TYPE A & B)
Influenza A By PCR: NEGATIVE
Influenza B By PCR: NEGATIVE

## 2018-10-04 MED ORDER — HEPARIN SODIUM (PORCINE) 5000 UNIT/ML IJ SOLN
5000.0000 [IU] | Freq: Three times a day (TID) | INTRAMUSCULAR | Status: DC
  Administered 2018-10-04 – 2018-10-05 (×2): 5000 [IU] via SUBCUTANEOUS
  Filled 2018-10-04 (×2): qty 1

## 2018-10-04 MED ORDER — GUAIFENESIN-DM 100-10 MG/5ML PO SYRP
10.0000 mL | ORAL_SOLUTION | ORAL | Status: DC | PRN
  Administered 2018-10-05 – 2018-10-12 (×5): 10 mL via ORAL
  Filled 2018-10-04 (×5): qty 10

## 2018-10-04 MED ORDER — RISPERIDONE 0.25 MG PO TABS
0.2500 mg | ORAL_TABLET | Freq: Every day | ORAL | Status: DC
  Administered 2018-10-04 – 2018-10-15 (×13): 0.25 mg via ORAL
  Filled 2018-10-04 (×13): qty 1

## 2018-10-04 MED ORDER — CALCIUM ACETATE (PHOS BINDER) 667 MG PO CAPS
1334.0000 mg | ORAL_CAPSULE | Freq: Three times a day (TID) | ORAL | Status: DC
  Administered 2018-10-05 – 2018-10-14 (×24): 1334 mg via ORAL
  Filled 2018-10-04 (×24): qty 2

## 2018-10-04 MED ORDER — VANCOMYCIN HCL 10 G IV SOLR
1750.0000 mg | Freq: Once | INTRAVENOUS | Status: AC
  Administered 2018-10-04: 1750 mg via INTRAVENOUS
  Filled 2018-10-04: qty 1750

## 2018-10-04 MED ORDER — IPRATROPIUM-ALBUTEROL 0.5-2.5 (3) MG/3ML IN SOLN
3.0000 mL | Freq: Four times a day (QID) | RESPIRATORY_TRACT | Status: DC | PRN
  Administered 2018-10-05 – 2018-10-07 (×2): 3 mL via RESPIRATORY_TRACT
  Filled 2018-10-04 (×2): qty 3

## 2018-10-04 MED ORDER — TRAZODONE HCL 100 MG PO TABS
100.0000 mg | ORAL_TABLET | Freq: Every day | ORAL | Status: DC
  Administered 2018-10-04 – 2018-10-15 (×12): 100 mg via ORAL
  Filled 2018-10-04 (×12): qty 1

## 2018-10-04 MED ORDER — CIPROFLOXACIN HCL 0.3 % OP SOLN
1.0000 [drp] | OPHTHALMIC | Status: DC
  Administered 2018-10-04 – 2018-10-07 (×13): 1 [drp] via OPHTHALMIC
  Filled 2018-10-04: qty 2.5

## 2018-10-04 MED ORDER — GABAPENTIN 300 MG PO CAPS
300.0000 mg | ORAL_CAPSULE | Freq: Two times a day (BID) | ORAL | Status: DC
  Administered 2018-10-04 – 2018-10-15 (×22): 300 mg via ORAL
  Filled 2018-10-04 (×22): qty 1

## 2018-10-04 MED ORDER — IPRATROPIUM-ALBUTEROL 0.5-2.5 (3) MG/3ML IN SOLN: 3.0000 mL | Freq: Three times a day (TID) | RESPIRATORY_TRACT | Status: DC

## 2018-10-04 MED ORDER — SODIUM CHLORIDE 0.9 % IV SOLN
1.0000 g | INTRAVENOUS | Status: DC
  Administered 2018-10-04 – 2018-10-10 (×7): 1 g via INTRAVENOUS
  Filled 2018-10-04 (×7): qty 1

## 2018-10-04 MED ORDER — VANCOMYCIN HCL IN DEXTROSE 750-5 MG/150ML-% IV SOLN
750.0000 mg | INTRAVENOUS | Status: DC
  Administered 2018-10-05 – 2018-10-09 (×2): 750 mg via INTRAVENOUS
  Filled 2018-10-04 (×3): qty 150

## 2018-10-04 NOTE — ED Provider Notes (Signed)
[facility] EMERGENCY DEPARTMENT Provider Note   CSN: [id number] Arrival date & time: 10/04/18  1711     History   Chief Complaint Chief Complaint  Patient presents with  . Cough    HPI James Nicholson is a 52 y.o. male.  52 year old male with prior medical history as detailed below presents for evaluation of increasing cough and shortness of breath.  43 of history is provided by the patient's brother.  Brother reports that the patient was recently admitted and then discharged following treatment for pneumonia.  Patient with worsening cough over the last 24 hours despite being on outpatient antibiotics.  Patient with small flecks of bloody sputum produced with the cough.  No reported fever.  Patient with mildly worsening shortness of breath over the last 24 hours per the brother as well.  The history is provided by the patient, medical records and a relative.  Cough  This is a recurrent problem. The current episode started yesterday. The problem occurs constantly. The problem has not changed since onset.The cough is productive of blood-tinged sputum. There has been no fever.    Past Medical History:  Diagnosis Date  . Anemia   . Atrial fibrillation   . Cardiomyopathy ([place]) 2010   Unclear Etiology: Last Echo 06/2009: EF 40-45%, severe Lateral & apical Hypokinesis (? CAD) ; Grade 2 DDysfxn. Mild conc LVH.   Marland Kitchen Cellulitis   . Chronic kidney disease (CKD), stage V ([place])    Dialysis Tue, Thurs, Sat  . Dyslipidemia   . Enterobacter sepsis ([place])   . Hyperlipidemia   . Hypertension   . IDDM (insulin dependent diabetes mellitus) ([place]) 11/03/2014  . S/P ICD (internal cardiac defibrillator) procedure 2010   VT Arrest (in [place])  . Schizophrenia ([place])   . Wegener's disease, pulmonary ([place]) 02/05/2013    Patient Active Problem List   Diagnosis Date Noted  . Conjunctivitis 09/27/2018  . Chronic kidney disease   . Hyperlipidemia   . Sepsis ([place]) 11/03/2014    . IDDM (insulin dependent diabetes mellitus) ([place]) 11/03/2014  . Troponin level elevated 12/22/2013    Class: Acute  . Mechanical complication of other vascular device, implant, and graft 08/04/2013  . Other complications due to renal dialysis device, implant, and graft 08/04/2013  . End stage renal disease ([place]) 04/13/2013  . Anemia in chronic kidney disease 02/05/2013  . Pneumonia 02/05/2013  . GI bleed 02/05/2013  . Metabolic acidosis 38/18/2993  . 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 PPD positive - completed 9 months INH (per Dr. Linus Salmons) 05/25/2012  . Other primary cardiomyopathies 05/07/2012  . Ventricular fibrillation ([place]) 05/07/2012    Class: History of  . Hypertension 05/07/2012  . Automatic implantable cardioverter-defibrillator in situ 05/07/2012    Past Surgical History:  Procedure Laterality Date  . AV FISTULA PLACEMENT Right 02/10/2013   Procedure: ARTERIOVENOUS (AV) FISTULA CREATION;  Surgeon: Rosetta Posner, MD;  Location[facility];  Service: Vascular;  Laterality: Right;  Right forearm radial/cephalic arterovenous fistula.   Marland Kitchen CARDIAC CATHETERIZATION  2010   [place]: In setting of VT arrest. Per brother's report, nonobstructive with no intervention  . CARDIAC DEFIBRILLATOR PLACEMENT  2010   [place]  . FISTULOGRAM Right 08/09/2013   Procedure: FISTULOGRAM;  Surgeon: Angelia Mould, MD;  Location: [facility] CATH LAB;  Service: Cardiovascular;  Laterality: Right;  . ICD GENERATOR CHANGEOUT N/A 12/05/2017   Procedure: ICD GENERATOR CHANGEOUT;  Surgeon: Evans Lance, MD;  Location: [facility] CV LAB;  Service: Cardiovascular;  Laterality: N/A;  . LIGATION OF COMPETING BRANCHES OF ARTERIOVENOUS FISTULA Right 08/13/2013   Procedure: LIGATION OF COMPETING BRANCHES X5 OF ARTERIOVENOUS FISTULA- RIGHT ARM;  Surgeon: Serafina Mitchell, MD;  Location: [facility] OR;  Service: Vascular;  Laterality: Right;        Home Medications    Prior to Admission medications    Medication Sig Start Date End Date Taking? Authorizing Provider  azithromycin (ZITHROMAX) 500 MG tablet Take 1 tablet (500 mg total) by mouth daily for 3 days. Take 1 tablet daily for 3 days. 10/01/18 10/04/18  Elgergawy, Silver Huguenin, MD  calcium acetate (PHOSLO) 667 MG capsule Take 1,334 mg by mouth 3 (three) times daily with meals. 8a, 12p, 5p 02/11/13   Regalado, Belkys A, MD  cefdinir (OMNICEF) 300 MG capsule Take 1 capsule (300 mg total) by mouth every other day. PLEASE TAKE AFTER HD 10/02/18   Elgergawy, Silver Huguenin, MD  ciprofloxacin (CILOXAN) 0.3 % ophthalmic solution Place 1 drop into the right eye every 4 (four) hours while awake for 5 days. 10/01/18 10/06/18  Elgergawy, Silver Huguenin, MD  gabapentin (NEURONTIN) 300 MG capsule Take 300 mg by mouth 2 (two) times daily.    [provider]  gemfibrozil (LOPID) 600 MG tablet Take 600 mg by mouth 2 (two) times daily before a meal.    [provider]  insulin detemir (LEVEMIR) 100 UNIT/ML injection Inject 0.05 mLs (5 Units total) into the skin at bedtime. 02/11/13   Regalado, Belkys A, MD  metoprolol succinate (TOPROL-XL) 50 MG 24 hr tablet Take 50 mg by mouth daily. Take with or immediately following a meal.     [provider]  omeprazole (PRILOSEC) 20 MG capsule Take 20 mg by mouth every morning.     [provider]  risperiDONE (RISPERDAL) 0.25 MG tablet Take 0.25 mg by mouth at bedtime.    [provider]  traZODone (DESYREL) 100 MG tablet Take 100 mg by mouth at bedtime.    [provider]    Family History Family History  Problem Relation Age of Onset  . CAD Mother     Social History Social History   Tobacco Use  . Smoking status: Former Smoker    Last attempt to quit: 10/28/2008    Years since quitting: 9.9  . Smokeless tobacco: Never Used  Substance Use Topics  . Alcohol use: No  . Drug use: No     Allergies   Patient has no known allergies.   Review of Systems Review of Systems   Respiratory: Positive for cough.   All other systems reviewed and are negative.    Physical Exam Updated Vital Signs BP 124/79   Pulse 95   Temp 99.1 F (37.3 C) (Oral)   Resp (!) 24   SpO2 95%   Physical Exam  Constitutional: He is oriented to person, place, and time. He appears well-developed and well-nourished. No distress.  HENT:  Head: Normocephalic and atraumatic.  Mouth/Throat: Oropharynx is clear and moist.  Eyes: Pupils are equal, round, and reactive to light. Conjunctivae and EOM are normal.  Neck: Normal range of motion. Neck supple.  Cardiovascular: Normal rate, regular rhythm and normal heart sounds.  Pulmonary/Chest: Effort normal.  Mildly decreased BS at bilateral bases   Abdominal: Soft. He exhibits no distension. There is no tenderness.  Musculoskeletal: Normal range of motion. He exhibits no edema or deformity.  Neurological: He is alert and oriented to person, place, and time.  Skin: Skin is warm and dry.  ACF in [place] with palpable thrill   Psychiatric: He has a normal mood and affect.  Nursing note and vitals reviewed.    ED Treatments / Results  Labs (all labs ordered are listed, but only abnormal results are displayed) Labs Reviewed  COMPREHENSIVE METABOLIC PANEL - Abnormal; Notable for the following components:      Result Value   Chloride 97 (*)    Glucose, Bld 116 (*)    BUN 94 (*)    Creatinine, Ser 15.11 (*)    Albumin 2.6 (*)    GFR calc non Af Amer 3 (*)    GFR calc Af Amer 4 (*)    Anion gap 20 (*)    All other components within normal limits  CBC WITH DIFFERENTIAL/PLATELET - Abnormal; Notable for the following components:   WBC 18.5 (*)    RBC 3.07 (*)    Hemoglobin 9.2 (*)    HCT 30.6 (*)    Neutro Abs 14.5 (*)    Monocytes Absolute 1.3 (*)    Eosinophils Absolute 1.0 (*)    Abs Immature Granulocytes 0.19 (*)    All other components within normal limits  CULTURE, BLOOD (ROUTINE X 2)  CULTURE, BLOOD (ROUTINE X 2)  I-STAT CG4  LACTIC ACID, ED  CBG MONITORING, ED    EKG EKG Interpretation  Date/Time:  Sunday October 04 2018 17:25:42 EST Ventricular Rate:  92 PR Interval:    QRS Duration: 132 QT Interval:  376 QTC Calculation: 466 R Axis:   -53 Text Interpretation:  Sinus rhythm RBBB and LAFB Baseline wander in lead(s) V2 Confirmed by Dene Gentry [phone]) on 10/04/2018 5:36:04 PM   Radiology Dg Chest 2 View  Result Date: 10/04/2018 CLINICAL DATA:  Cough, pneumonia, blood in sputum EXAM: CHEST - 2 VIEW COMPARISON:  09/27/2017 FINDINGS: Low lung volumes. Bibasilar atelectasis is suspected. No focal consolidation. Left lower hemithorax obscured. No pleural effusion or pneumothorax. Cardiomegaly.  Left subclavian ICD. IMPRESSION: Low lung volumes with probable bibasilar atelectasis. Electronically Signed   By: Julian Hy M.D.   On: 10/04/2018 19:34    Procedures Procedures (including critical care time)  Medications Ordered in ED Medications - No data to display   Initial Impression / Assessment and Plan / ED Course  I have reviewed the triage vital signs and the nursing notes.  Pertinent labs & imaging results that were available during my care of the patient were reviewed by me and considered in my medical decision making (see chart for details).     MDM  Screen complete  Patient is presenting for reevaluation following recent admission for pneumonia.  Patient remains on outpatient antibiotics.  He is presenting with increased cough, low-grade temp, and worsening shortness of breath over the last 24 hours.  Work-up is concerning given worsening leukocytosis. CXR does not demonstrate discrete infiltrate.  Other screening labs are without significant acute pathology.  Case discussed with hospitalist service who will evaluate for admission.  Hospitalist service is aware that no antibiotic have thus far been given in the ED.  Final Clinical Impressions(s) / ED Diagnoses   Final diagnoses:    Cough  Leukocytosis, unspecified type  ESRD (end stage renal disease) [facility])    ED Discharge Orders    None       Valarie Merino, MD 10/04/18 2027

## 2018-10-04 NOTE — ED Triage Notes (Signed)
No improvement in pneumonia.  Brother states his wife seen blood in sputum.  Pt c/o pain at rib areas.

## 2018-10-04 NOTE — Progress Notes (Signed)
Pharmacy Antibiotic Note  James Nicholson is a 52 y.o. male admitted on 10/04/2018 with pneumonia.  Pharmacy has been consulted for vancomycin dosing. ESRD on HD MWF.  Plan: Vancomycin 1750mg  IV x 1; then 750mg  IV qHD MWF Cefepime 1g IV q24h Monitor clinical progress, c/s, abx plan/LOT Pre-HD vancomycin level as indicated F/u HD schedule/tolerance inpatient for antibiotic maintenance doses - confirm HD schedule when seen by Renal     Temp (24hrs), Avg:99.1 F (37.3 C), Min:99.1 F (37.3 C), Max:99.1 F (37.3 C)  Recent Labs  Lab 09/29/18 0831 09/29/18 0835 10/01/18 0704 10/01/18 0820 10/01/18 0821 10/04/18 1808 10/04/18 1816  WBC  --  15.1* 11.2*  --  11.7* 18.5*  --   CREATININE 14.30*  --   --  11.81*  --  15.11*  --   LATICACIDVEN  --   --   --   --   --   --  0.97    Estimated Creatinine Clearance: 5.8 mL/min (A) (by C-G formula based on SCr of 15.11 mg/dL (H)).    No Known Allergies  Elicia Lamp, PharmD, BCPS Please check AMION for all [place] contact numbers Clinical Pharmacist 10/04/2018 8:43 PM

## 2018-10-04 NOTE — H&P (Signed)
History and Physical  James Nicholson DEY:[id number] DOB: 11-30-1965 DOA: 10/04/2018 1715  Referring physician: Demaris Callander [facility] ED) PCP: [name], MD   HISTORY   Chief Complaint: refractory cough with mild hemoptysis  HPI: James Nicholson is a 52 y.o. male of [place] descent with granulomatous polyangiitis (P-ANCA), ESRD on HD (MWF), IDDM, schizophrenia diagnosis (per chart), hx of VT arrest s/p ICD (2010) who was recently hospitalized for suspected PNA and discharged with PO abx but returns with ongoing dyspnea, worsening cough with blood tinged sputum. History mostly obtained from brother. Patient was recently discharged for suspected PNA this past week but has been continuing to have productive cough with mostly yellowish but sometimes blood tinged sputum. Occasional mild nose bleed in AM. His brother became concerned when patient appeared to have labored breathing, which prompted him to bring him to [facility] ED. Patient has been compliant with PO abx (cefdinir and azithromycin). Next HD due on Monday, per brother.   Per chart review of prior rheumatology consult note, patient was diagnosed with Wegener's in 2006 while in [place] and then had immigrated to [place] in 2010. He reportedly was off of steroids in 2010 and until 2014 had no flares. 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 Of note, he was found to have latent TB by PPD for which he completed isoniazide treatment.  Since then, patient has had intermittent hospitalizations once in 2015 (for ?pericarditis), 2016 (for PNA), and then most recently in Dec 2019 for PNA last week as described earlier. Has not been on steroids or biologics for Wegener's in the past few years per chart review.   Review of Systems:  + cough productive, blood tinged + dyspnea - no fevers/chills - no chest pain - no edema, PND, orthopnea - no nausea/vomiting; no tarry, melanotic or bloody  stools - no dysuria, increased urinary frequency - no weight changes Rest of systems reviewed are negative, except as per above history.   ED course:  Vitals Blood pressure 133/81, pulse 96, temperature 99.1 F (37.3 C), temperature source Oral, resp. rate (!) 22, SpO2 93 %. No interventions in ED.   Past Medical History:  Diagnosis Date  . Anemia   . Atrial fibrillation   . Cardiomyopathy ([place]) 2010   Unclear Etiology: Last Echo 06/2009: EF 40-45%, severe Lateral & apical Hypokinesis (? CAD) ; Grade 2 DDysfxn. Mild conc LVH.   Marland Kitchen Cellulitis   . Chronic kidney disease (CKD), stage V ([place])    Dialysis Tue, Thurs, Sat  . Dyslipidemia   . Enterobacter sepsis ([place])   . Hyperlipidemia   . Hypertension   . IDDM (insulin dependent diabetes mellitus) ([place]) 11/03/2014  . S/P ICD (internal cardiac defibrillator) procedure 2010   VT Arrest (in [place])  . Schizophrenia ([place])   . 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 AV FISTULA PLACEMENT Right 02/10/2013   Procedure: ARTERIOVENOUS (AV) FISTULA CREATION;  Surgeon: Rosetta Posner, MD;  Location: [facility] OR;  Service: Vascular;  Laterality: Right;  Right forearm radial/cephalic arterovenous fistula.   Marland Kitchen CARDIAC CATHETERIZATION  2010   [place]: In setting of VT arrest. Per brother's report, nonobstructive with no intervention  . CARDIAC DEFIBRILLATOR PLACEMENT  2010   [place]  . FISTULOGRAM Right 08/09/2013   Procedure: FISTULOGRAM;  Surgeon: Angelia Mould, MD;  Location: [facility] CATH LAB;  Service: Cardiovascular;  Laterality: Right;  .  ICD GENERATOR CHANGEOUT N/A 12/05/2017   Procedure: ICD GENERATOR CHANGEOUT;  Surgeon: Evans Lance, MD;  Location: [place] CV LAB;  Service: Cardiovascular;  Laterality: N/A;  . LIGATION OF COMPETING BRANCHES OF ARTERIOVENOUS FISTULA Right 08/13/2013   Procedure: LIGATION OF COMPETING BRANCHES X5 OF ARTERIOVENOUS FISTULA- RIGHT ARM;  Surgeon: Serafina Mitchell, MD;  Location: [place] OR;  Service: Vascular;  Laterality: Right;    Social History:  reports that he quit smoking about 9 years ago. He has never used smokeless tobacco. He reports that he does not drink alcohol or use drugs.  No Known Allergies  Family History  Problem Relation Age of Onset  . CAD Mother       Prior to Admission medications   Medication Sig Start Date End Date Taking? Authorizing Provider  calcium acetate (PHOSLO) 667 MG capsule Take 1,334 mg by mouth 3 (three) times daily with meals. 8a, 12p, 5p 02/11/13  Yes Regalado, Belkys A, MD  ciprofloxacin (CILOXAN) 0.3 % ophthalmic solution Place 1 drop into the right eye every 4 (four) hours while awake for 5 days. 10/01/18 10/06/18 Yes Elgergawy, Silver Huguenin, MD  gabapentin (NEURONTIN) 300 MG capsule Take 300 mg by mouth 2 (two) times daily.   Yes [provider]  gemfibrozil (LOPID) 600 MG tablet Take 600 mg by mouth 2 (two) times daily before a meal.   Yes [provider]  insulin detemir (LEVEMIR) 100 UNIT/ML injection Inject 0.05 mLs (5 Units total) into the skin at bedtime. 02/11/13  Yes Regalado, Belkys A, MD  metoprolol succinate (TOPROL-XL) 50 MG 24 hr tablet Take 50 mg by mouth daily. Take with or immediately following a meal.    Yes [provider]  omeprazole (PRILOSEC) 20 MG capsule Take 20 mg by mouth every morning.    Yes [provider]  risperiDONE (RISPERDAL) 0.25 MG tablet Take 0.25 mg by mouth at bedtime.   Yes [provider]  traZODone (DESYREL) 100 MG tablet Take 100 mg by mouth at bedtime.   Yes [provider]  cefdinir (OMNICEF) 300 MG capsule Take 1 capsule (300 mg total) by mouth every other day. PLEASE TAKE AFTER HD Patient not taking: Reported on 10/04/2018 10/02/18   Elgergawy, Silver Huguenin, MD    PHYSICAL EXAM   Temp:  [99.1 F (37.3 C)] 99.1 F (37.3 C) (12/08 1734) Pulse Rate:  [92-97] 96 (12/08 2000) Resp:  [21-25] 22 (12/08 2000) BP:  (124-138)/(78-81) 133/81 (12/08 2000) SpO2:  [93 %-95 %] 93 % (12/08 2000)  BP 133/81   Pulse 96   Temp 99.1 F (37.3 C) (Oral)   Resp (!) 22   SpO2 93%    GEN well-nourished middle-aged caucasian male; appears chronically ill  HEENT NCAT EOM intact; R eye conjunctival hyperemia; no discharge; ; clear oropharynx, no cervical LAD; dry mucus membranes  JVP estimated 4-5 cm H2O above RA; no HJR ; no carotid bruits b/l ;  CV regular normal rate; normal S1 and S2; no m/r/g or S3/S4; PMI non displaced; no parasternal heave; L ICD pocket well-healed, non tender, no hematoma  RESP  Coarse throughout; diminished with crackles on L side; breathing mildly labored with accessory mm use; overall symmetric air movement ABD soft NT ND +normoactive BS  EXT warm throughout b/l; no peripheral edema b/l RUE forearm fistula with intact thrill  PULSES  DP and radials 2+ intact b/l (note R radial obscured by fistula thrill)  SKIN/MSK no rashes or lesions  NEURO/PSYCH AAOx4; no focal deficits   DATA   LABS ON ADMISSION:  Basic Metabolic Panel: Recent Labs  Lab 09/29/18 0831 10/01/18 0820 10/04/18 1808  NA 138 138 140  K 4.8 4.3 4.8  CL 103 98 97*  CO2 17* 22 23  GLUCOSE 102* 99 116*  BUN 86* 73* 94*  CREATININE 14.30* 11.81* 15.11*  CALCIUM 9.4 9.4 9.4  PHOS 4.9* 5.1*  --    CBC: Recent Labs  Lab 09/29/18 0835 10/01/18 0704 10/01/18 0821 10/04/18 1808  WBC 15.1* 11.2* 11.7* 18.5*  NEUTROABS  --   --   --  14.5*  HGB 9.5* 9.2* 9.4* 9.2*  HCT 31.6* 30.4* 31.0* 30.6*  MCV 100.6* 97.4 98.4 99.7  PLT 215 275 269 332   Liver Function Tests: Recent Labs  Lab 09/29/18 0831 10/01/18 0820 10/04/18 1808  AST  --   --  17  ALT  --   --  30  ALKPHOS  --   --  51  BILITOT  --   --  1.0  PROT  --   --  7.7  ALBUMIN 2.7* 2.6* 2.6*   No results for input(s): LIPASE, AMYLASE in the last 168 hours. No results for input(s): AMMONIA in the last 168 hours. Coagulation:  Lab Results    Component Value Date   INR 1.0 11/28/2017   INR 1.04 07/06/2016   INR 1.30 02/05/2013   No results found for: PTT Lactic Acid, Venous:     Component Value Date/Time   LATICACIDVEN 0.97 10/04/2018 1816   Cardiac Enzymes: No results for input(s): CKTOTAL, CKMB, CKMBINDEX, TROPONINI in the last 168 hours. Urinalysis:  BNP (last 3 results) No results for input(s): PROBNP in the last 8760 hours. CBG: Recent Labs  Lab 09/29/18 2125 09/30/18 0815 09/30/18 1141 09/30/18 1646 09/30/18 2120  GLUCAP 118* 93 107* 112* 110*    Radiological Exams on Admission: Dg Chest 2 View  Result Date: 10/04/2018 CLINICAL DATA:  Cough, pneumonia, blood in sputum EXAM: CHEST - 2 VIEW COMPARISON:  09/27/2017 FINDINGS: Low lung volumes. Bibasilar atelectasis is suspected. No focal consolidation. Left lower hemithorax obscured. No pleural effusion or pneumothorax. Cardiomegaly.  Left subclavian ICD. IMPRESSION: Low lung volumes with probable bibasilar atelectasis. Electronically Signed   By: Julian Hy M.D.   On: 10/04/2018 19:34    EKG: Independently reviewed. NSR, RBBB, and LAFB   ASSESSMENT AND PLAN   Assessment: James Nicholson is a 52 y.o. male with granulomatous polyangiitis, ESRD on HD (MWF), IDDM, schizophrenia diagnosis (per chart), hx of VT arrest s/p ICD (2010) who was recently hospitalized for suspected PNA and discharged with PO abx but returns with ongoing dyspnea, worsening cough with blood tinged sputum. Leukocytosis WBC 18 (from 11 on discharge) and non contrast CT chest is suggestive of L sided infection (awaiting radiology read). No evidence of frank Wegener's related diffuse alveolar hemorrhage and clinically not suggestive of PE at this time, although still on differential. The other concern is that symptoms are related to Wegener's flare -- he is currently not on biologics or steroids. Last flare documented in our system was in 2014 when he required IV solumedrol and cytoxan.  Will broaden abx for HCAP, especially since he is a dialysis patient and complete infectious workup, consider rheumatology involvement. Hemodynamically stable at this time.    Active Problems:   Pneumonia  Plan:   # Refractory PNA, concern for HCAP on L lung (other ddx: Wegener's flare or PE) > WBC  18.5; productive cough - non contrast CT chest read pending - if cavitations present on CT chest or little clinical improvement w abx, consult rheumatology for potential initiation of steroids/tx for wegener's - influenza panel pending - droplet precaution - broad spec abx: vancomycin and cefepime (D1 12/8) - duonebs q8h - guaifenesin q4h prn cough - incentive spirometry  # Hx of granulomatous polyangiitis with renal disease > not on biologics for Wegener's - continue dialysis for ESRD - follow as outpatient  # ESRD on HD via R forearm fistula MWF per brother - consult nephrology for HD on 12/9 while inpatient - continue phoslo tid - renal diet  # Hx of schizphrenia/?schizoaffective do > stable, no issues - resume home risperdal 0.25mg   - resume trazodone 100mg  nightly  # Hx of peripheral neuropathy in legs b/l  - resume gabapentin home dose 300mg  BID  # Normocytic anemia, likely mixed etiology due to ESRD and chronic disease > Hb 9-10 baseline although has been higher in the apst  - follow as outpatient  # Reported IDDM on reportedly only 5 units of levemir > A1c was 4.5 on 2015  - hold insulin for now; likely should discontinue insulin on discharge  - repeat A1c pending  - if glucose elevated on chem panel tomorrow, start SSI + fingersticks ACHS  # R eye conjunctivitis  - continue ciprofloxacin eye drops  - may be related to underlying Wegener's  - needs outpatient follow up with rheumatology  DVT Prophylaxis: heparin subq Code Status:  Full Code Family Communication: patient and brother at bedside Disposition Plan: admit to inpatient floor for HCAP treatment and  workup  Patient contact: Extended Emergency Contact Information Primary Emergency Contact: Savarino,Velcro Address: [address]          [place], [place] [address] [place] Phone: [phone] Mobile Phone: [phone] Relation: Brother  Time spent: > 35 minutes  Colbert Ewing, MD [facility] Pager [phone]  If 7PM-7AM, please contact night-coverage [email or web address] Password [facility] 10/04/2018, 9:36 PM

## 2018-10-05 DIAGNOSIS — R918 Other nonspecific abnormal finding of lung field: Secondary | ICD-10-CM | POA: Diagnosis present

## 2018-10-05 LAB — MAGNESIUM: Magnesium: 2.4 mg/dL (ref 1.7–2.4)

## 2018-10-05 LAB — BASIC METABOLIC PANEL
Anion gap: 18 — ABNORMAL HIGH (ref 5–15)
BUN: 106 mg/dL — ABNORMAL HIGH (ref 6–20)
CO2: 21 mmol/L — ABNORMAL LOW (ref 22–32)
Calcium: 8.9 mg/dL (ref 8.9–10.3)
Chloride: 102 mmol/L (ref 98–111)
Creatinine, Ser: 15.98 mg/dL — ABNORMAL HIGH (ref 0.61–1.24)
GFR calc Af Amer: 4 mL/min — ABNORMAL LOW (ref >60)
GFR calc non Af Amer: 3 mL/min — ABNORMAL LOW (ref >60)
Glucose, Bld: 94 mg/dL (ref 70–99)
Potassium: 5 mmol/L (ref 3.5–5.1)
Sodium: 141 mmol/L (ref 135–145)

## 2018-10-05 LAB — CBC WITH DIFFERENTIAL/PLATELET
Abs Immature Granulocytes: 0.18 10*3/uL — ABNORMAL HIGH (ref 0.00–0.07)
Basophils Absolute: 0 10*3/uL (ref 0.0–0.1)
Basophils Relative: 0 %
Eosinophils Absolute: 1 10*3/uL — ABNORMAL HIGH (ref 0.0–0.5)
Eosinophils Relative: 6 %
HCT: 28.2 % — ABNORMAL LOW (ref 39.0–52.0)
Hemoglobin: 8.5 g/dL — ABNORMAL LOW (ref 13.0–17.0)
Immature Granulocytes: 1 %
Lymphocytes Relative: 12 %
Lymphs Abs: 1.9 10*3/uL (ref 0.7–4.0)
MCH: 29.7 pg (ref 26.0–34.0)
MCHC: 30.1 g/dL (ref 30.0–36.0)
MCV: 98.6 fL (ref 80.0–100.0)
Monocytes Absolute: 1.1 10*3/uL — ABNORMAL HIGH (ref 0.1–1.0)
Monocytes Relative: 6 %
Neutro Abs: 12.3 10*3/uL — ABNORMAL HIGH (ref 1.7–7.7)
Neutrophils Relative %: 75 %
Platelets: 313 10*3/uL (ref 150–400)
RBC: 2.86 MIL/uL — ABNORMAL LOW (ref 4.22–5.81)
RDW: 14.8 % (ref 11.5–15.5)
WBC: 16.4 10*3/uL — ABNORMAL HIGH (ref 4.0–10.5)
nRBC: 0 % (ref 0.0–0.2)

## 2018-10-05 LAB — GLUCOSE, CAPILLARY
Glucose-Capillary: 80 mg/dL (ref 70–99)
Glucose-Capillary: 90 mg/dL (ref 70–99)
Glucose-Capillary: 81 mg/dL (ref 70–99)
Glucose-Capillary: 149 mg/dL — ABNORMAL HIGH (ref 70–99)

## 2018-10-05 MED ORDER — ACETAMINOPHEN 325 MG PO TABS
650.0000 mg | ORAL_TABLET | Freq: Four times a day (QID) | ORAL | Status: DC | PRN
  Administered 2018-10-05 – 2018-10-13 (×7): 650 mg via ORAL
  Filled 2018-10-05 (×7): qty 2

## 2018-10-05 MED ORDER — INSULIN ASPART 100 UNIT/ML ~~LOC~~ SOLN
0.0000 [IU] | Freq: Three times a day (TID) | SUBCUTANEOUS | Status: DC
  Administered 2018-10-07 – 2018-10-08 (×2): 1 [IU] via SUBCUTANEOUS
  Administered 2018-10-08 – 2018-10-10 (×3): 2 [IU] via SUBCUTANEOUS
  Administered 2018-10-10: 1 [IU] via SUBCUTANEOUS
  Administered 2018-10-11 (×2): 3 [IU] via SUBCUTANEOUS
  Administered 2018-10-11: 2 [IU] via SUBCUTANEOUS
  Administered 2018-10-12: 1 [IU] via SUBCUTANEOUS
  Administered 2018-10-12: 3 [IU] via SUBCUTANEOUS
  Administered 2018-10-13 – 2018-10-14 (×4): 2 [IU] via SUBCUTANEOUS
  Administered 2018-10-14 (×2): 1 [IU] via SUBCUTANEOUS
  Administered 2018-10-15: 2 [IU] via SUBCUTANEOUS

## 2018-10-05 MED ORDER — INSULIN ASPART 100 UNIT/ML ~~LOC~~ SOLN
0.0000 [IU] | Freq: Every day | SUBCUTANEOUS | Status: DC
  Administered 2018-10-11 – 2018-10-12 (×2): 2 [IU] via SUBCUTANEOUS

## 2018-10-05 MED ORDER — HEPARIN SODIUM (PORCINE) 5000 UNIT/ML IJ SOLN
5000.0000 [IU] | Freq: Three times a day (TID) | INTRAMUSCULAR | Status: AC
  Administered 2018-10-05: 5000 [IU] via SUBCUTANEOUS
  Filled 2018-10-05 (×2): qty 1

## 2018-10-05 MED ORDER — CHLORHEXIDINE GLUCONATE CLOTH 2 % EX PADS
6.0000 | MEDICATED_PAD | Freq: Every day | CUTANEOUS | Status: DC
  Administered 2018-10-05 – 2018-10-09 (×4): 6 via TOPICAL

## 2018-10-05 MED ORDER — SODIUM CHLORIDE 0.9 % IV BOLUS
500.0000 mL | Freq: Once | INTRAVENOUS | Status: AC
  Administered 2018-10-05: 500 mL via INTRAVENOUS

## 2018-10-05 MED ORDER — VANCOMYCIN HCL IN DEXTROSE 750-5 MG/150ML-% IV SOLN
INTRAVENOUS | Status: AC
  Administered 2018-10-05: 750 mg via INTRAVENOUS
  Filled 2018-10-05: qty 150

## 2018-10-05 MED ORDER — CALCITRIOL 0.5 MCG PO CAPS
ORAL_CAPSULE | ORAL | Status: AC
  Administered 2018-10-05: 1 ug via ORAL
  Filled 2018-10-05: qty 2

## 2018-10-05 MED ORDER — MORPHINE SULFATE (PF) 2 MG/ML IV SOLN
2.0000 mg | Freq: Once | INTRAVENOUS | Status: AC
  Administered 2018-10-05: 2 mg via INTRAVENOUS
  Filled 2018-10-05: qty 1

## 2018-10-05 MED ORDER — ACETAMINOPHEN 325 MG PO TABS
ORAL_TABLET | ORAL | Status: AC
  Administered 2018-10-05: 325 mg
  Filled 2018-10-05: qty 2

## 2018-10-05 MED ORDER — CALCITRIOL 0.5 MCG PO CAPS
1.0000 ug | ORAL_CAPSULE | ORAL | Status: DC
  Administered 2018-10-05 – 2018-10-16 (×5): 1 ug via ORAL
  Filled 2018-10-05 (×6): qty 2

## 2018-10-05 NOTE — Progress Notes (Signed)
Pt complained of chest pain and back area pain, chest xray showed pneumonia, no SOB noted, with non productive cough. BP is 137/78, RR 20, PR 118, Temp 98.5, reported to on call physician with order to give morphine 2 mg IV .

## 2018-10-05 NOTE — H&P (Signed)
Chief Complaint: Lung mass  Referring Physician(s): Denton Brick, [place]  Supervising Physician: Markus Daft  Patient Status: [facility] - In-pt  History of Present Illness: James Nicholson is a 52 y.o. male who was recently hospitalized with pneumonia.   He was discharged on antibiotics but continued to have dyspnea, ongoing cough, and blood tinged sputum.  His brother brought him back to the ED for evaluation.  CT scan showed an 11 x 17 mm left apical nodule highly suspicious for primary bronchogenic neoplasm.  He also has a diagnosis of Wegener's which was made in 2006.  We are asked to evaluate for image guided biopsy of the left apical nodule.   Past Medical History:  Diagnosis Date  . Anemia   . Atrial fibrillation   . Cardiomyopathy ([place]) 2010   Unclear Etiology: Last Echo 06/2009: EF 40-45%, severe Lateral & apical Hypokinesis (? CAD) ; Grade 2 DDysfxn. Mild conc LVH.   Marland Kitchen Cellulitis   . Chronic kidney disease (CKD), stage V ([place])    Dialysis Tue, Thurs, Sat  . Dyslipidemia   . Enterobacter sepsis ([place])   . Hyperlipidemia   . Hypertension   . IDDM (insulin dependent diabetes mellitus) ([place]) 11/03/2014  . S/P ICD (internal cardiac defibrillator) procedure 2010   VT Arrest (in [place])  . Schizophrenia ([place])   . 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 AV FISTULA PLACEMENT Right 02/10/2013   Procedure: ARTERIOVENOUS (AV) FISTULA CREATION;  Surgeon: Rosetta Posner, MD;  Location: [facility] OR;  Service: Vascular;  Laterality: Right;  Right forearm radial/cephalic arterovenous fistula.   Marland Kitchen CARDIAC CATHETERIZATION  2010   [place]: In setting of VT arrest. Per brother's report, nonobstructive with no intervention  . CARDIAC DEFIBRILLATOR PLACEMENT  2010   [place]  . FISTULOGRAM Right 08/09/2013   Procedure: FISTULOGRAM;  Surgeon: Angelia Mould, MD;  Location: [facility] CATH LAB;  Service: Cardiovascular;  Laterality:  Right;  . ICD GENERATOR CHANGEOUT N/A 12/05/2017   Procedure: ICD GENERATOR CHANGEOUT;  Surgeon: Evans Lance, MD;  Location: [facility] [place] CV LAB;  Service: Cardiovascular;  Laterality: N/A;  . LIGATION OF COMPETING BRANCHES OF ARTERIOVENOUS FISTULA Right 08/13/2013   Procedure: LIGATION OF COMPETING BRANCHES X5 OF ARTERIOVENOUS FISTULA- RIGHT ARM;  Surgeon: Serafina Mitchell, MD;  Location: [place];  Service: Vascular;  Laterality: Right;    Allergies: Patient has no known allergies.  Medications: Prior to Admission medications   Medication Sig Start Date End Date Taking? Authorizing Provider  calcium acetate (PHOSLO) 667 MG capsule Take 1,334 mg by mouth 3 (three) times daily with meals. 8a, 12p, 5p 02/11/13  Yes Regalado, Belkys A, MD  ciprofloxacin (CILOXAN) 0.3 % ophthalmic solution Place 1 drop into the right eye every 4 (four) hours while awake for 5 days. 10/01/18 10/06/18 Yes Elgergawy, Silver Huguenin, MD  gabapentin (NEURONTIN) 300 MG capsule Take 300 mg by mouth 2 (two) times daily.   Yes [provider]  gemfibrozil (LOPID) 600 MG tablet Take 600 mg by mouth 2 (two) times daily before a meal.   Yes [provider]  insulin detemir (LEVEMIR) 100 UNIT/ML injection Inject 0.05 mLs (5 Units total) into the skin at bedtime. 02/11/13  Yes Regalado, Belkys A, MD  metoprolol succinate (TOPROL-XL) 50 MG 24 hr tablet Take 50 mg by mouth daily. Take with or immediately following a meal.    Yes [provider]  omeprazole (PRILOSEC) 20 MG  capsule Take 20 mg by mouth every morning.    Yes [provider]  risperiDONE (RISPERDAL) 0.25 MG tablet Take 0.25 mg by mouth at bedtime.   Yes [provider]  traZODone (DESYREL) 100 MG tablet Take 100 mg by mouth at bedtime.   Yes [provider]     Family History  Problem Relation Age of Onset  . CAD Mother     Social History   Socioeconomic History  . Marital status: Single    Spouse name: Not on file   . Number of children: Not on file  . Years of education: Not on file  . Highest education level: Not on file  Occupational History  . Not on file  Social Needs  . Financial resource strain: Not on file  . Food insecurity:    Worry: Not on file    Inability: Not on file  . Transportation needs:    Medical: Not on file    Non-medical: Not on file  Tobacco Use  . Smoking status: Former Smoker    Last attempt to quit: 10/28/2008    Years since quitting: 9.9  . Smokeless tobacco: Never Used  Substance and Sexual Activity  . Alcohol use: No  . Drug use: No  . Sexual activity: Not on file  Lifestyle  . Physical activity:    Days per week: Not on file    Minutes per session: Not on file  . Stress: Not on file  Relationships  . Social connections:    Talks on phone: Not on file    Gets together: Not on file    Attends religious service: Not on file    Active member of club or organization: Not on file    Attends meetings of clubs or organizations: Not on file    Relationship status: Not on file  Other Topics Concern  . Not on file  Social History Narrative  . Not on file    Review of Systems: A 12 point ROS discussed and pertinent positives are indicated in the HPI above.  All other systems are negative.  Review of Systems  Vital Signs: BP 137/78 (BP Location: Left Arm)   Pulse (!) 118   Temp 98.5 F (36.9 C) (Oral)   Resp 20   SpO2 93%   Physical Exam  Constitutional: He is oriented to person, place, and time. He appears well-developed.  HENT:  Head: Normocephalic and atraumatic.  Eyes: EOM are normal.  Neck: Normal range of motion.  Cardiovascular: Regular rhythm and normal heart sounds.  Tachy 120  Pulmonary/Chest: Effort normal.  Abdominal: Soft.  Musculoskeletal: Normal range of motion.  Neurological: He is alert and oriented to person, place, and time.  Skin: Skin is warm and dry.  Psychiatric: He has a normal mood and affect. His behavior is normal.  Judgment and thought content normal.  Vitals reviewed.   Imaging: Dg Chest 2 View  Result Date: 10/04/2018 CLINICAL DATA:  Cough, pneumonia, blood in sputum EXAM: CHEST - 2 VIEW COMPARISON:  09/27/2017 FINDINGS: Low lung volumes. Bibasilar atelectasis is suspected. No focal consolidation. Left lower hemithorax obscured. No pleural effusion or pneumothorax. Cardiomegaly.  Left subclavian ICD. IMPRESSION: Low lung volumes with probable bibasilar atelectasis. Electronically Signed   By: Julian Hy M.D.   On: 10/04/2018 19:34   Dg Chest 2 View  Result Date: 09/27/2018 CLINICAL DATA:  Chest pain and shortness of breath.  Cough. EXAM: CHEST - 2 VIEW COMPARISON:  11/03/2014 FINDINGS:  Stable position of the cardiac pacemaker. Enlarged cardiac silhouette.  Mediastinal contours appear intact. There is no evidence of focal pleural effusion or pneumothorax. Elevation of the right hemidiaphragm likely due to volume loss in the right hemithorax. Subtle peribronchial bilateral lower lobe opacities. Osseous structures are without acute abnormality. Soft tissues are grossly normal. IMPRESSION: Bilateral peribronchial lower lobe airspace opacities which may represent atelectasis or airspace consolidation. Electronically Signed   By: Fidela Salisbury M.D.   On: 09/27/2018 10:00   Ct Chest Wo Contrast  Result Date: 10/04/2018 CLINICAL DATA:  Pneumonia. EXAM: CT CHEST WITHOUT CONTRAST TECHNIQUE: Multidetector CT imaging of the chest was performed following the standard protocol without IV contrast. COMPARISON:  04/12/2013 FINDINGS: Cardiovascular: Heart is enlarged. No substantial pericardial effusion. Permanent pacemaker noted. Coronary artery calcification is evident. Mediastinum/Nodes: No mediastinal lymphadenopathy. No evidence for gross hilar lymphadenopathy although assessment is limited by the lack of intravenous contrast on today's study. The esophagus has normal imaging features. There is no axillary  lymphadenopathy. Lungs/Pleura: The central tracheobronchial airways are patent. Patchy airspace disease noted in the lower lobes bilaterally suggests pneumonia. 11 x 17 mm nodule identified in the left lung apex, not present on the previous remote study and concerning for primary bronchogenic neoplasm. No pleural effusion. Upper Abdomen: Unremarkable. Musculoskeletal: No worrisome lytic or sclerotic osseous abnormality. IMPRESSION: 1. Bibasilar patchy airspace disease suggests pneumonia. 2. 11 x 17 mm left apical nodule highly suspicious for primary bronchogenic neoplasm. 3.  Aortic Atherosclerois (ICD10-170.0) Electronically Signed   By: Misty Stanley M.D.   On: 10/04/2018 21:40    Labs:  CBC: Recent Labs    10/01/18 0821 10/04/18 1808 10/04/18 2133 10/05/18 0204  WBC 11.7* 18.5* 17.9* 16.4*  HGB 9.4* 9.2* 9.3* 8.5*  HCT 31.0* 30.6* 29.8* 28.2*  PLT 269 332 313 313    COAGS: Recent Labs    11/28/17 1050  INR 1.0  APTT 27    BMP: Recent Labs    09/29/18 0831 10/01/18 0820 10/04/18 1808 10/04/18 2133 10/05/18 0204  NA 138 138 140  --  141  K 4.8 4.3 4.8  --  5.0  CL 103 98 97*  --  102  CO2 17* 22 23  --  21*  GLUCOSE 102* 99 116*  --  94  BUN 86* 73* 94*  --  106*  CALCIUM 9.4 9.4 9.4  --  8.9  CREATININE 14.30* 11.81* 15.11* 15.51* 15.98*  GFRNONAA 3* 4* 3* 3* 3*  GFRAA 4* 5* 4* 4* 4*    LIVER FUNCTION TESTS: Recent Labs    09/27/18 0856 09/29/18 0831 10/01/18 0820 10/04/18 1808  BILITOT 1.1  --   --  1.0  AST 12*  --   --  17  ALT 11  --   --  30  ALKPHOS 55  --   --  51  PROT 7.8  --   --  7.7  ALBUMIN 3.2* 2.7* 2.6* 2.6*    TUMOR MARKERS: No results for input(s): AFPTM, CEA, CA199, CHROMGRNA in the last 8760 hours.  Assessment and Plan:  11 x 17 mm left apical nodule highly suspicious for primary bronchogenic neoplasm.  Will proceed with image guided biopsy.  Risks and benefits discussed with the patient including, but not limited to  bleeding, hemoptysis, respiratory failure requiring intubation, infection, pneumothorax requiring chest tube placement, stroke from air embolism or even death.  All of the patient's questions were answered, patient is agreeable to proceed. Consent signed and  in chart.  Thank you for this interesting consult.  I greatly enjoyed meeting Beth Soderman and look forward to participating in their care.  A copy of this report was sent to the requesting provider on this date.  Electronically Signed: Murrell Redden, PA-C   10/05/2018, 12:46 PM      I spent a total of 40 Minutes in face to face in clinical consultation, greater than 50% of which was counseling/coordinating care for lung biopsy.

## 2018-10-05 NOTE — Consult Note (Signed)
Renal Service Consult Note [facility]  [facility] [place] 10/05/2018 Sol Blazing Requesting Physician:  Dr Denton Brick  Reason for Consult:  ESRD pt w/ recurrent PNA HPI: The patient is a 52 y.o. year-old with hx of Wegener's/ eSRD on HD, HTN, DM2, HL, CM w ICD, afib and schizophrenia. Is on HD TTS.  Was in hospital here last week w/ PNA and dc'd home, now back w/ some labored breathing , nosebleed and readmitted.  CT 12/8 showing bibasilar patchy airspace disease suggesting pneumonia and an 11 x 17 mm left apical nodule highly suspicious for primary bronchogenic neoplasm.  Patient w/o c/o's this am     ROS  n/a language barrier   Past Medical History  Past Medical History:  Diagnosis Date  . Anemia   . Atrial fibrillation   . Cardiomyopathy ([place]) 2010   Unclear Etiology: Last Echo 06/2009: EF 40-45%, severe Lateral & apical Hypokinesis (? CAD) ; Grade 2 DDysfxn. Mild conc LVH.   Marland Kitchen Cellulitis   . Chronic kidney disease (CKD), stage V ([place])    Dialysis Tue, Thurs, Sat  . Dyslipidemia   . Enterobacter sepsis ([place])   . Hyperlipidemia   . Hypertension   . IDDM (insulin dependent diabetes mellitus) ([place]) 11/03/2014  . S/P ICD (internal cardiac defibrillator) procedure 2010   VT Arrest (in [place])  . Schizophrenia ([place])   . Wegener's disease, pulmonary ([place]) 02/05/2013   Past Surgical History  Past Surgical History:  Procedure Laterality Date  . AV FISTULA PLACEMENT Right 02/10/2013   Procedure: ARTERIOVENOUS (AV) FISTULA CREATION;  Surgeon: Rosetta Posner, MD;  Location: [facility] OR;  Service: Vascular;  Laterality: Right;  Right forearm radial/cephalic arterovenous fistula.   Marland Kitchen CARDIAC CATHETERIZATION  2010   [place]: In setting of VT arrest. Per brother's report, nonobstructive with no intervention  . CARDIAC DEFIBRILLATOR PLACEMENT  2010   [place]  . FISTULOGRAM Right 08/09/2013   Procedure: FISTULOGRAM;  Surgeon: Angelia Mould, MD;  Location: [facility] CATH LAB;   Service: Cardiovascular;  Laterality: Right;  . ICD GENERATOR CHANGEOUT N/A 12/05/2017   Procedure: ICD GENERATOR CHANGEOUT;  Surgeon: Evans Lance, MD;  Location: [facility] CV LAB;  Service: Cardiovascular;  Laterality: N/A;  . LIGATION OF COMPETING BRANCHES OF ARTERIOVENOUS FISTULA Right 08/13/2013   Procedure: LIGATION OF COMPETING BRANCHES X5 OF ARTERIOVENOUS FISTULA- RIGHT ARM;  Surgeon: Serafina Mitchell, MD;  Location: [facility] OR;  Service: Vascular;  Laterality: Right;   Family History  Family History  Problem Relation Age of Onset  . CAD Mother    Social History  reports that he quit smoking about 9 years ago. He has never used smokeless tobacco. He reports that he does not drink alcohol or use drugs. Allergies No Known Allergies Home medications Prior to Admission medications   Medication Sig Start Date End Date Taking? Authorizing Provider  calcium acetate (PHOSLO) 667 MG capsule Take 1,334 mg by mouth 3 (three) times daily with meals. 8a, 12p, 5p 02/11/13  Yes Regalado, Belkys A, MD  ciprofloxacin (CILOXAN) 0.3 % ophthalmic solution Place 1 drop into the right eye every 4 (four) hours while awake for 5 days. 10/01/18 10/06/18 Yes Elgergawy, Silver Huguenin, MD  gabapentin (NEURONTIN) 300 MG capsule Take 300 mg by mouth 2 (two) times daily.   Yes [provider]  gemfibrozil (LOPID) 600 MG tablet Take 600 mg by mouth 2 (two) times daily before a meal.   Yes [provider]  insulin detemir (LEVEMIR) 100  UNIT/ML injection Inject 0.05 mLs (5 Units total) into the skin at bedtime. 02/11/13  Yes Regalado, Belkys A, MD  metoprolol succinate (TOPROL-XL) 50 MG 24 hr tablet Take 50 mg by mouth daily. Take with or immediately following a meal.    Yes [provider]  omeprazole (PRILOSEC) 20 MG capsule Take 20 mg by mouth every morning.    Yes [provider]  risperiDONE (RISPERDAL) 0.25 MG tablet Take 0.25 mg by mouth at bedtime.   Yes [provider]   traZODone (DESYREL) 100 MG tablet Take 100 mg by mouth at bedtime.   Yes [provider]   Liver Function Tests Recent Labs  Lab 09/29/18 0831 10/01/18 0820 10/04/18 1808  AST  --   --  17  ALT  --   --  30  ALKPHOS  --   --  51  BILITOT  --   --  1.0  PROT  --   --  7.7  ALBUMIN 2.7* 2.6* 2.6*   No results for input(s): LIPASE, AMYLASE in the last 168 hours. CBC Recent Labs  Lab 10/04/18 1808 10/04/18 2133 10/05/18 0204  WBC 18.5* 17.9* 16.4*  NEUTROABS 14.5*  --  12.3*  HGB 9.2* 9.3* 8.5*  HCT 30.6* 29.8* 28.2*  MCV 99.7 98.3 98.6  PLT 332 313 347   Basic Metabolic Panel Recent Labs  Lab 09/29/18 0831 10/01/18 0820 10/04/18 1808 10/04/18 2133 10/05/18 0204  NA 138 138 140  --  141  K 4.8 4.3 4.8  --  5.0  CL 103 98 97*  --  102  CO2 17* 22 23  --  21*  GLUCOSE 102* 99 116*  --  94  BUN 86* 73* 94*  --  106*  CREATININE 14.30* 11.81* 15.11* 15.51* 15.98*  CALCIUM 9.4 9.4 9.4  --  8.9  PHOS 4.9* 5.1*  --   --   --    Iron/TIBC/Ferritin/ %Sat    Component Value Date/Time   IRON 82 02/05/2013 1900   TIBC 162 (L) 02/05/2013 1900   FERRITIN 1,377 (H) 02/05/2013 1900   IRONPCTSAT 51 02/05/2013 1900    Vitals:   10/04/18 2000 10/04/18 2030 10/04/18 2310 10/05/18 0503  BP: 133/81 130/76 139/72 137/78  Pulse: 96 99 (!) 107 (!) 118  Resp: (!) 22 19 (!) 22 20  Temp:   100.2 F (37.9 C) 98.5 F (36.9 C)  TempSrc:   Oral Oral  SpO2: 93% 94% 92% 93%   Exam Gen appears in no distress, somewhat disheveled No rash, cyanosis or gangrene Sclera anicteric, throat clear  No jvd or bruits Chest bronch BS R base, L basilar coarse rales, no ^WOB RRR no MRG Abd soft ntnd no mass or ascites +bs GU normal male   MS no joint effusions or deformity Ext no LE or UE edema, no wounds or ulcers Neuro is alert, nonfocal R arm aVF+bruit    Home meds:  - calc acetate 1334 tid ac  - risperidone 0.25 hs/ trazodone 100 hs/ gabapentin 300 bid  - gemfibrozil  600 bid/ omeprazole 20 am  - insulin detemir 5u hs  - metoprolol xl 50 qd   Dialysis: MWF    4h  400/800  77.5kg  2/2 bath  R AVF  Hep 7600  - M 75 every 2 wks  - vit D 1.0 ug tiw po     Impression/ Plan: 1. PNA - recurrent , per CT, on IV abx per primary 2.  ESRD on HD MWF.  Plan HD today on sched 3. Volume - at dry wt, lower edw slightly 4. Psych - h/o schizophrenia, cont meds 5. MBD ckd - cont binder/ vit D 6. Anemia ckd - cont esa prn 7. Lung nodule - suspicious per Rad report for poss malignancy, per primary     Kelly Splinter MD [place] pager [phone]   10/05/2018, 10:49 AM

## 2018-10-05 NOTE — Progress Notes (Signed)
Patient Demographics:    James Nicholson, is a 52 y.o. male, DOB - 1966/06/01, MLY:[id number]  Admit date - 10/04/2018   Admitting Physician Colbert Ewing, MD  Outpatient Primary MD for the patient is [name], MD  LOS - 1   Chief Complaint  Patient presents with  . Cough        Subjective:    James Nicholson today has no fevers, no emesis,  No chest pain, interpreter service used, cough persist  Assessment  & Plan :    Active Problems:   Lung nodules--left upper lobe and left posterior lobe- need to rule out malignancy   H/o Ventricular fibrillation Arrest now with AICD   Hypertension   Automatic implantable cardioverter-defibrillator in situ   PPD positive - completed 9 months INH (per Dr. Linus Salmons)   Pneumonia   Wegener's disease, pulmonary ([place])   End stage renal disease ([place])  Brief Summary  52 y.o. year-old James Nicholson speaking make with hx of Wegener's disease (pulmonary),  ESRD on HD MWF, HTN, DM2, HL, CM w ICD  and schizophrenia admitted on 10/04/2018 with persistent respiratory symptoms concerning for possible pneumonia but need to rule out underlying malignancy   Plan:-  1)Dyspnea--- possible pneumonia versus Wegener's granulomatosis of the lungs versus underlying malignancy-  patient was admitted from 09/28/1999 19-25 2019 treated for pneumonia, now presenting with persistent dry cough, some degree of hemoptysis, no fevers... With 11 x 17 mm left apical nodule as well as left posterior nodule concerning for possible primary bronchogenic neoplasm, discussed with on-call pulmonologist Dr. Nelda Marseille.... Who states that the lesions are not amenable to biopsy via bronchoscopy, discussed with Dr. Anselm Pancoast from interventional radiology, Dr Anselm Pancoast will evaluate patient for possible diagnostic biopsy, continue Vanco and cefepime for now, also continue bronchodilators and mucolytics for now, continue  supplemental oxygen  2)ESRD: HD MWF-discussed with nephrologist Dr. Jonnie Finner for HD on 10/05/2018  3)DM2-   recent A1c is 4.8 reflecting very excellent and over aggressive diabetic control at this time, stop Levemir to avoid significant hypoglycemia, use sliding scale coverage only Accu-Cheks  4)Schizophrenia: Appears stable-continue risperidone 0.25 mg nightly and trazodone 100 mg nightly  5)HFrEF/Chronic systolic heart failure:   No recent echo available, last known EF of 45%, continue to use hemodialysis to address volume status, patient appears euvolemic and compensated at this time  6)History of VF arrest/cardiomyopathy: ICD in place-follows with Dr. Eilleen Kempf of CKD--- stable, EPO agent per nephrology team   Disposition/Need for in-Hospital Stay- patient unable to be discharged at this time due to recurrent pneumonia with dyspnea and hemoptysis at this time, may need biopsy of lung nodule to rule out malignancy, continue IV antibiotics for now for persistent "pneumonia"  Code Status : Full   Disposition Plan  : TBD  Consults  :  Nephrology/IR and Phone consult with PCCM  DVT Prophylaxis  :   Heparin    Lab Results  Component Value Date   PLT 313 10/05/2018    Inpatient Medications  Scheduled Meds: . acetaminophen      . calcitRIOL  1 mcg Oral Q M,W,F-HD  . calcium acetate  1,334 mg Oral TID WC  . Chlorhexidine Gluconate Cloth  6 each Topical Q0600  . ciprofloxacin  1 drop Right Eye Q4H while awake  . gabapentin  300 mg Oral BID  . heparin  5,000 Units Subcutaneous Q8H  . insulin aspart  0-5 Units Subcutaneous QHS  . insulin aspart  0-9 Units Subcutaneous TID WC  . risperiDONE  0.25 mg Oral QHS  . traZODone  100 mg Oral QHS   Continuous Infusions: . ceFEPime (MAXIPIME) IV 1 g (10/04/18 2247)  . vancomycin     PRN Meds:.guaiFENesin-dextromethorphan, ipratropium-albuterol    Anti-infectives (From admission, onward)   Start     Dose/Rate Route  Frequency Ordered Stop   10/05/18 1200  vancomycin (VANCOCIN) IVPB 750 mg/150 ml premix     750 mg 150 mL/hr over 60 Minutes Intravenous Every M-W-F (Hemodialysis) 10/04/18 2049     10/04/18 2100  ceFEPIme (MAXIPIME) 1 g in sodium chloride 0.9 % 100 mL IVPB     1 g 200 mL/hr over 30 Minutes Intravenous Every 24 hours 10/04/18 2039     10/04/18 2100  vancomycin (VANCOCIN) 1,750 mg in sodium chloride 0.9 % 500 mL IVPB     1,750 mg 250 mL/hr over 120 Minutes Intravenous  Once 10/04/18 2047 10/05/18 0137        Objective:   Vitals:   10/05/18 0503 10/05/18 1245 10/05/18 1254 10/05/18 1300  BP: 137/78 (!) 146/86 (!) 142/85 136/88  Pulse: (!) 118 (!) 110 (!) 108 (!) 107  Resp: 20     Temp: 98.5 F (36.9 C) 98.7 F (37.1 C)    TempSrc: Oral Oral    SpO2: 93%     Weight:  81 kg      Wt Readings from Last 3 Encounters:  10/05/18 81 kg  10/01/18 77 kg  05/26/18 80.3 kg    Intake/Output Summary (Last 24 hours) at 10/05/2018 1521 Last data filed at 10/05/2018 1100 Gross per 24 hour  Intake 240 ml  Output -  Net 240 ml    Physical Exam Patient is examined daily including today on 10/05/18  , exams remain the same as of yesterday except that has changed   Gen:- Awake Alert,  In no apparent distress  HEENT:- [place].AT, No sclera icterus Neck-Supple Neck,No JVD,.  Lungs-diminished breath sounds, scattered rhonchi but no wheezing CV- S1, S2 normal, regular , AICD in situ Abd-  +ve B.Sounds, Abd Soft, No tenderness,    Extremity/Skin:- No  edema, pedal pulses present , right upper extremity AV fistula with positive thrill and bruit Psych-affect is appropriate, oriented x3 Neuro-no new focal deficits, no tremors   Data Review:   Micro Results Recent Results (from the past 240 hour(s))  Culture, blood (routine x 2) Call MD if unable to obtain prior to antibiotics being given     Status: None   Collection Time: 09/27/18  5:04 PM  Result Value Ref Range Status   Specimen Description  BLOOD BLOOD LEFT ARM  Final   Special Requests   Final    BOTTLES DRAWN AEROBIC ONLY Blood Culture adequate volume   Culture   Final    NO GROWTH 5 DAYS Performed at [facility], [address], [place], [place] [address]    Report Status 10/02/2018 FINAL  Final  Culture, blood (routine x 2) Call MD if unable to obtain prior to antibiotics being given     Status: None   Collection Time: 09/27/18  5:04 PM  Result Value Ref Range Status   Specimen Description BLOOD THUMB  Final   Special Requests  Final    BOTTLES DRAWN AEROBIC ONLY Blood Culture adequate volume   Culture   Final    NO GROWTH 5 DAYS Performed at [facility], [place] [address], [place], [place] [address]    Report Status 10/02/2018 FINAL  Final  Culture, blood (routine x 2)     Status: None (Preliminary result)   Collection Time: 10/04/18  8:09 PM  Result Value Ref Range Status   Specimen Description BLOOD LEFT ARM  Final   Special Requests   Final    BOTTLES DRAWN AEROBIC AND ANAEROBIC Blood Culture adequate volume   Culture   Final    NO GROWTH < 24 HOURS Performed at [facility], [place] [address] [place], [place] [address]    Report Status PENDING  Incomplete  Culture, blood (routine x 2)     Status: None (Preliminary result)   Collection Time: 10/04/18  8:50 PM  Result Value Ref Range Status   Specimen Description BLOOD LEFT FOREARM  Final   Special Requests   Final    BOTTLES DRAWN AEROBIC AND ANAEROBIC Blood Culture adequate volume   Culture   Final    NO GROWTH < 24 HOURS Performed at [facility], [place] [address], [place],  [address]    Report Status PENDING  Incomplete    Radiology Reports Dg Chest 2 View  Result Date: 10/04/2018 CLINICAL DATA:  Cough, pneumonia, blood in sputum EXAM: CHEST - 2 VIEW COMPARISON:  09/27/2017 FINDINGS: Low lung volumes. Bibasilar atelectasis is suspected. No focal consolidation. Left lower hemithorax obscured. No pleural effusion or  pneumothorax. Cardiomegaly.  Left subclavian ICD. IMPRESSION: Low lung volumes with probable bibasilar atelectasis. Electronically Signed   By: Julian Hy M.D.   On: 10/04/2018 19:34   Dg Chest 2 View  Result Date: 09/27/2018 CLINICAL DATA:  Chest pain and shortness of breath.  Cough. EXAM: CHEST - 2 VIEW COMPARISON:  11/03/2014 FINDINGS: Stable position of the cardiac pacemaker. Enlarged cardiac silhouette.  Mediastinal contours appear intact. There is no evidence of focal pleural effusion or pneumothorax. Elevation of the right hemidiaphragm likely due to volume loss in the right hemithorax. Subtle peribronchial bilateral lower lobe opacities. Osseous structures are without acute abnormality. Soft tissues are grossly normal. IMPRESSION: Bilateral peribronchial lower lobe airspace opacities which may represent atelectasis or airspace consolidation. Electronically Signed   By: Fidela Salisbury M.D.   On: 09/27/2018 10:00   Ct Chest Wo Contrast  Result Date: 10/04/2018 CLINICAL DATA:  Pneumonia. EXAM: CT CHEST WITHOUT CONTRAST TECHNIQUE: Multidetector CT imaging of the chest was performed following the standard protocol without IV contrast. COMPARISON:  04/12/2013 FINDINGS: Cardiovascular: Heart is enlarged. No substantial pericardial effusion. Permanent pacemaker noted. Coronary artery calcification is evident. Mediastinum/Nodes: No mediastinal lymphadenopathy. No evidence for gross hilar lymphadenopathy although assessment is limited by the lack of intravenous contrast on today's study. The esophagus has normal imaging features. There is no axillary lymphadenopathy. Lungs/Pleura: The central tracheobronchial airways are patent. Patchy airspace disease noted in the lower lobes bilaterally suggests pneumonia. 11 x 17 mm nodule identified in the left lung apex, not present on the previous remote study and concerning for primary bronchogenic neoplasm. No pleural effusion. Upper Abdomen: Unremarkable.  Musculoskeletal: No worrisome lytic or sclerotic osseous abnormality. IMPRESSION: 1. Bibasilar patchy airspace disease suggests pneumonia. 2. 11 x 17 mm left apical nodule highly suspicious for primary bronchogenic neoplasm. 3.  Aortic Atherosclerois (ICD10-170.0) Electronically Signed   By: Misty Stanley  M.D.   On: 10/04/2018 21:40     CBC Recent Labs  Lab 10/01/18 0704 10/01/18 0821 10/04/18 1808 10/04/18 2133 10/05/18 0204  WBC 11.2* 11.7* 18.5* 17.9* 16.4*  HGB 9.2* 9.4* 9.2* 9.3* 8.5*  HCT 30.4* 31.0* 30.6* 29.8* 28.2*  PLT 275 269 332 313 313  MCV 97.4 98.4 99.7 98.3 98.6  MCH 29.5 29.8 30.0 30.7 29.7  MCHC 30.3 30.3 30.1 31.2 30.1  RDW 14.6 14.6 14.8 14.8 14.8  LYMPHSABS  --   --  1.5  --  1.9  MONOABS  --   --  1.3*  --  1.1*  EOSABS  --   --  1.0*  --  1.0*  BASOSABS  --   --  0.1  --  0.0    Chemistries  Recent Labs  Lab 09/29/18 0831 10/01/18 0820 10/04/18 1808 10/04/18 2133 10/05/18 0204  NA 138 138 140  --  141  K 4.8 4.3 4.8  --  5.0  CL 103 98 97*  --  102  CO2 17* 22 23  --  21*  GLUCOSE 102* 99 116*  --  94  BUN 86* 73* 94*  --  106*  CREATININE 14.30* 11.81* 15.11* 15.51* 15.98*  CALCIUM 9.4 9.4 9.4  --  8.9  MG  --   --   --   --  2.4  AST  --   --  17  --   --   ALT  --   --  30  --   --   ALKPHOS  --   --  51  --   --   BILITOT  --   --  1.0  --   --    ------------------------------------------------------------------------------------------------------------------ No results for input(s): CHOL, HDL, LDLCALC, TRIG, CHOLHDL, LDLDIRECT in the last 72 hours.  Lab Results  Component Value Date   HGBA1C 4.8 10/04/2018    Roxan Hockey M.D on 10/05/2018 at 3:21 PM  Pager---[phone] Go to [email or web address] - password [email or web address] for contact info  [facility] - Office  [phone]

## 2018-10-05 NOTE — Procedures (Signed)
   I was present at this dialysis session, have reviewed the session itself and made  appropriate changes Kelly Splinter MD [place] pager [phone]   10/05/2018, 4:03 PM

## 2018-10-05 NOTE — Progress Notes (Signed)
Pt's temp is 101.6 orally. HR 119 apically. Other VS wnl. Made NP on call aware. Awaiting for response.

## 2018-10-06 DIAGNOSIS — I4901 Ventricular fibrillation: Secondary | ICD-10-CM

## 2018-10-06 LAB — GLUCOSE, CAPILLARY
Glucose-Capillary: 110 mg/dL — ABNORMAL HIGH (ref 70–99)
Glucose-Capillary: 83 mg/dL (ref 70–99)
Glucose-Capillary: 117 mg/dL — ABNORMAL HIGH (ref 70–99)
Glucose-Capillary: 130 mg/dL — ABNORMAL HIGH (ref 70–99)

## 2018-10-06 LAB — CBC WITH DIFFERENTIAL/PLATELET
Abs Immature Granulocytes: 0.2 10*3/uL — ABNORMAL HIGH (ref 0.00–0.07)
Basophils Absolute: 0.1 10*3/uL (ref 0.0–0.1)
Basophils Relative: 1 %
Eosinophils Absolute: 0.8 10*3/uL — ABNORMAL HIGH (ref 0.0–0.5)
Eosinophils Relative: 6 %
HCT: 28 % — ABNORMAL LOW (ref 39.0–52.0)
Hemoglobin: 8.7 g/dL — ABNORMAL LOW (ref 13.0–17.0)
Immature Granulocytes: 2 %
Lymphocytes Relative: 11 %
Lymphs Abs: 1.5 10*3/uL (ref 0.7–4.0)
MCH: 30.3 pg (ref 26.0–34.0)
MCHC: 31.1 g/dL (ref 30.0–36.0)
MCV: 97.6 fL (ref 80.0–100.0)
Monocytes Absolute: 1 10*3/uL (ref 0.1–1.0)
Monocytes Relative: 7 %
Neutro Abs: 10 10*3/uL — ABNORMAL HIGH (ref 1.7–7.7)
Neutrophils Relative %: 73 %
Platelets: 283 10*3/uL (ref 150–400)
RBC: 2.87 MIL/uL — ABNORMAL LOW (ref 4.22–5.81)
RDW: 14.9 % (ref 11.5–15.5)
WBC: 13.5 10*3/uL — ABNORMAL HIGH (ref 4.0–10.5)
nRBC: 0 % (ref 0.0–0.2)

## 2018-10-06 LAB — PROTIME-INR
INR: 1.33
Prothrombin Time: 16.3 seconds — ABNORMAL HIGH (ref 11.4–15.2)

## 2018-10-06 LAB — BASIC METABOLIC PANEL
Anion gap: 15 (ref 5–15)
BUN: 42 mg/dL — ABNORMAL HIGH (ref 6–20)
CO2: 27 mmol/L (ref 22–32)
Calcium: 8.6 mg/dL — ABNORMAL LOW (ref 8.9–10.3)
Chloride: 97 mmol/L — ABNORMAL LOW (ref 98–111)
Creatinine, Ser: 8.46 mg/dL — ABNORMAL HIGH (ref 0.61–1.24)
GFR calc Af Amer: 8 mL/min — ABNORMAL LOW (ref >60)
GFR calc non Af Amer: 7 mL/min — ABNORMAL LOW (ref >60)
Glucose, Bld: 105 mg/dL — ABNORMAL HIGH (ref 70–99)
Potassium: 4.1 mmol/L (ref 3.5–5.1)
Sodium: 139 mmol/L (ref 135–145)

## 2018-10-06 LAB — MAGNESIUM: Magnesium: 2.2 mg/dL (ref 1.7–2.4)

## 2018-10-06 MED ORDER — DEXTROSE-NACL 5-0.45 % IV SOLN
INTRAVENOUS | Status: DC
  Administered 2018-10-06: 14:00:00 via INTRAVENOUS

## 2018-10-06 MED ORDER — ONDANSETRON HCL 4 MG/2ML IJ SOLN
4.0000 mg | Freq: Once | INTRAMUSCULAR | Status: AC
  Administered 2018-10-06: 4 mg via INTRAVENOUS
  Filled 2018-10-06: qty 2

## 2018-10-06 MED ORDER — ALBUTEROL SULFATE (2.5 MG/3ML) 0.083% IN NEBU
2.5000 mg | INHALATION_SOLUTION | Freq: Four times a day (QID) | RESPIRATORY_TRACT | Status: DC
  Administered 2018-10-06 (×2): 2.5 mg via RESPIRATORY_TRACT
  Filled 2018-10-06 (×2): qty 3

## 2018-10-06 MED ORDER — ONDANSETRON HCL 4 MG/2ML IJ SOLN
4.0000 mg | Freq: Four times a day (QID) | INTRAMUSCULAR | Status: DC | PRN
  Administered 2018-10-09 – 2018-10-14 (×2): 4 mg via INTRAVENOUS
  Filled 2018-10-06 (×2): qty 2

## 2018-10-06 MED ORDER — PRO-STAT SUGAR FREE PO LIQD
30.0000 mL | Freq: Two times a day (BID) | ORAL | Status: DC
  Administered 2018-10-06 – 2018-10-15 (×19): 30 mL via ORAL
  Filled 2018-10-06 (×20): qty 30

## 2018-10-06 NOTE — Progress Notes (Signed)
Patient Demographics:    James Nicholson, is a 52 y.o. male, DOB - July 11, 1966, ZPH:[id number]  Admit date - 10/04/2018   Admitting Physician Colbert Ewing, MD  Outpatient Primary MD for the patient is [name], MD  LOS - 2   Chief Complaint  Patient presents with  . Cough        Subjective:    James Nicholson today has no fevers, no emesis,  No chest pain, English-speaking brother at bedside, patient had post- tussive emesis x2 requiring Zofran, also had fevers  Assessment  & Plan :    Active Problems:   Lung nodules--left upper lobe and left posterior lobe- need to rule out malignancy   H/o Ventricular fibrillation Arrest now with AICD   Hypertension   Automatic implantable cardioverter-defibrillator in situ   PPD positive - completed 9 months INH (per Dr. Linus Salmons)   Pneumonia   Wegener's disease, pulmonary ([place])   End stage renal disease ([place])  Brief Summary  52 y.o. year-old [place] speaking make with hx of Wegener's disease (pulmonary),  ESRD on HD MWF, HTN, DM2, HL, CM w ICD  and schizophrenia admitted on 10/04/2018 with persistent respiratory symptoms concerning for possible pneumonia but need to rule out underlying malignancy, Awaiting image guided biopsy of lung nodule to rule out malignancy, continue IV antibiotics for now for persistent "pneumonia".... Patient failed outpatient treatment with Omnicef and azithromycin   Plan:-  1)Dyspnea--- Possible Pneumonia Versus Wegener's granulomatosis of the lungs Versus underlying malignancy-  patient was admitted from 09/28/1999 10/01/18  for pneumonia, now presenting with persistent dry cough, some degree of hemoptysis, had fevers... With 11 x 17 mm left apical nodule as well as left posterior nodule concerning for possible primary bronchogenic neoplasm, discussed with on-call pulmonologist Dr. Nelda Marseille.... Who states that the lesions are  not amenable to biopsy via bronchoscopy, discussed with Dr. Anselm Pancoast from interventional radiology, Dr Anselm Pancoast (IR) Plans image guided lung biopsy on 10/07/2018 , Pt failed outpatient treatment with Omnicef and azithromycin, continue Vanco and cefepime for now pending cultures, also continue bronchodilators and mucolytics for now, continue supplemental oxygen  2)ESRD: HD MWF-discussed with nephrologist Dr. Jonnie Finner had HD on 10/05/2018  3)DM2-   recent A1c is 4.8 reflecting very excellent and over aggressive diabetic control at this time, stop Levemir to avoid significant hypoglycemia, use sliding scale coverage only Accu-Cheks  4)Schizophrenia: Appears stable-continue risperidone 0.25 mg nightly and trazodone 100 mg nightly  5)HFrEF/Chronic systolic heart failure:   No recent echo available, last known EF of 45%, continue to use hemodialysis to address volume status, patient appears euvolemic and compensated at this time  6)History of VF arrest/cardiomyopathy: ICD in place-follows with Dr. Eilleen Kempf of CKD--- stable, EPO agent per nephrology team  8)DVT Prophylaxis--- patient restart subcu heparin for DVT prophylaxis on 10/07/2018 after lung biopsy if okay with IR  Disposition/Need for in-Hospital Stay- patient unable to be discharged at this time due to recurrent pneumonia with dyspnea and hemoptysis at this time, Awaiting image guided biopsy of lung nodule to rule out malignancy, continue IV antibiotics for now for persistent "pneumonia"  Code Status : Full   Disposition Plan  : TBD  Consults  :  Nephrology/IR and Phone consult with PCCM  DVT Prophylaxis  :   Heparin  (on hold to allow for lung biopsy on 10/07/2018)  Lab Results  Component Value Date   PLT 283 10/06/2018    Inpatient Medications  Scheduled Meds: . albuterol  2.5 mg Nebulization Q6H  . calcitRIOL  1 mcg Oral Q M,W,F-HD  . calcium acetate  1,334 mg Oral TID WC  . Chlorhexidine Gluconate Cloth  6 each Topical  Q0600  . ciprofloxacin  1 drop Right Eye Q4H while awake  . feeding supplement (PRO-STAT SUGAR FREE 64)  30 mL Oral BID  . gabapentin  300 mg Oral BID  . insulin aspart  0-5 Units Subcutaneous QHS  . insulin aspart  0-9 Units Subcutaneous TID WC  . risperiDONE  0.25 mg Oral QHS  . traZODone  100 mg Oral QHS   Continuous Infusions: . ceFEPime (MAXIPIME) IV 1 g (10/05/18 2015)  . vancomycin 750 mg (10/05/18 1626)   PRN Meds:.acetaminophen, guaiFENesin-dextromethorphan, ipratropium-albuterol, ondansetron (ZOFRAN) IV    Anti-infectives (From admission, onward)   Start     Dose/Rate Route Frequency Ordered Stop   10/05/18 1200  vancomycin (VANCOCIN) IVPB 750 mg/150 ml premix     750 mg 150 mL/hr over 60 Minutes Intravenous Every M-W-F (Hemodialysis) 10/04/18 2049     10/04/18 2100  ceFEPIme (MAXIPIME) 1 g in sodium chloride 0.9 % 100 mL IVPB     1 g 200 mL/hr over 30 Minutes Intravenous Every 24 hours 10/04/18 2039     10/04/18 2100  vancomycin (VANCOCIN) 1,750 mg in sodium chloride 0.9 % 500 mL IVPB     1,750 mg 250 mL/hr over 120 Minutes Intravenous  Once 10/04/18 2047 10/05/18 0137        Objective:   Vitals:   10/06/18 0018 10/06/18 0521 10/06/18 1415 10/06/18 1424  BP: 114/73 130/84 121/69   Pulse: (!) 109 97 (!) 113 (!) 109  Resp: 19 15 14 18   Temp: 99 F (37.2 C) 97.9 F (36.6 C) 98.4 F (36.9 C)   TempSrc: Oral Oral Oral   SpO2: 97% 95% 93% 92%  Weight:        Wt Readings from Last 3 Encounters:  10/05/18 79 kg  10/01/18 77 kg  05/26/18 80.3 kg    Intake/Output Summary (Last 24 hours) at 10/06/2018 1609 Last data filed at 10/06/2018 1330 Gross per 24 hour  Intake 190 ml  Output 2011 ml  Net -1821 ml    Physical Exam Patient is examined daily including today on 10/06/18  , exams remain the same as of yesterday except that has changed   Gen:- Awake Alert,  In no apparent distress  HEENT:- [place].AT, No sclera icterus Neck-Supple Neck,No JVD,.    Lungs-diminished breath sounds, scattered rhonchi but no wheezing CV- S1, S2 normal, regular , AICD in situ Abd-  +ve B.Sounds, Abd Soft, No tenderness,    Extremity/Skin:- No  edema, pedal pulses present , right Forearm AV fistula with positive thrill and bruit Psych-affect is appropriate, oriented x3 Neuro-no new focal deficits, no tremors   Data Review:   Micro Results Recent Results (from the past 240 hour(s))  Culture, blood (routine x 2) Call MD if unable to obtain prior to antibiotics being given     Status: None   Collection Time: 09/27/18  5:04 PM  Result Value Ref Range Status   Specimen Description BLOOD BLOOD LEFT ARM  Final   Special Requests   Final    BOTTLES DRAWN AEROBIC ONLY Blood  Culture adequate volume   Culture   Final    NO GROWTH 5 DAYS Performed at [facility], [place] [address], [place], [place] [address]    Report Status 10/02/2018 FINAL  Final  Culture, blood (routine x 2) Call MD if unable to obtain prior to antibiotics being given     Status: None   Collection Time: 09/27/18  5:04 PM  Result Value Ref Range Status   Specimen Description BLOOD THUMB  Final   Special Requests   Final    BOTTLES DRAWN AEROBIC ONLY Blood Culture adequate volume   Culture   Final    NO GROWTH 5 DAYS Performed at [facility], [address] [place], [place] [address]    Report Status 10/02/2018 FINAL  Final  Culture, blood (routine x 2)     Status: None (Preliminary result)   Collection Time: 10/04/18  8:09 PM  Result Value Ref Range Status   Specimen Description BLOOD LEFT ARM  Final   Special Requests   Final    BOTTLES DRAWN AEROBIC AND ANAEROBIC Blood Culture adequate volume   Culture   Final    NO GROWTH 2 DAYS Performed at [facility], [place] [address], [place], [place] [address]    Report Status PENDING  Incomplete  Culture, blood (routine x 2)     Status: None (Preliminary result)   Collection Time: 10/04/18  8:50 PM  Result Value Ref  Range Status   Specimen Description BLOOD LEFT FOREARM  Final   Special Requests   Final    BOTTLES DRAWN AEROBIC AND ANAEROBIC Blood Culture adequate volume   Culture   Final    NO GROWTH 2 DAYS Performed at [facility], [place] [address], [place], [place] [address]    Report Status PENDING  Incomplete    Radiology Reports Dg Chest 2 View  Result Date: 10/04/2018 CLINICAL DATA:  Cough, pneumonia, blood in sputum EXAM: CHEST - 2 VIEW COMPARISON:  09/27/2017 FINDINGS: Low lung volumes. Bibasilar atelectasis is suspected. No focal consolidation. Left lower hemithorax obscured. No pleural effusion or pneumothorax. Cardiomegaly.  Left subclavian ICD. IMPRESSION: Low lung volumes with probable bibasilar atelectasis. Electronically Signed   By: Julian Hy M.D.   On: 10/04/2018 19:34   Dg Chest 2 View  Result Date: 09/27/2018 CLINICAL DATA:  Chest pain and shortness of breath.  Cough. EXAM: CHEST - 2 VIEW COMPARISON:  11/03/2014 FINDINGS: Stable position of the cardiac pacemaker. Enlarged cardiac silhouette.  Mediastinal contours appear intact. There is no evidence of focal pleural effusion or pneumothorax. Elevation of the right hemidiaphragm likely due to volume loss in the right hemithorax. Subtle peribronchial bilateral lower lobe opacities. Osseous structures are without acute abnormality. Soft tissues are grossly normal. IMPRESSION: Bilateral peribronchial lower lobe airspace opacities which may represent atelectasis or airspace consolidation. Electronically Signed   By: Fidela Salisbury M.D.   On: 09/27/2018 10:00   Ct Chest Wo Contrast  Result Date: 10/04/2018 CLINICAL DATA:  Pneumonia. EXAM: CT CHEST WITHOUT CONTRAST TECHNIQUE: Multidetector CT imaging of the chest was performed following the standard protocol without IV contrast. COMPARISON:  04/12/2013 FINDINGS: Cardiovascular: Heart is enlarged. No substantial pericardial effusion. Permanent pacemaker noted. Coronary artery  calcification is evident. Mediastinum/Nodes: No mediastinal lymphadenopathy. No evidence for gross hilar lymphadenopathy although assessment is limited by the lack of intravenous contrast on today's study. The esophagus has normal imaging features. There is no axillary lymphadenopathy. Lungs/Pleura: The central tracheobronchial  airways are patent. Patchy airspace disease noted in the lower lobes bilaterally suggests pneumonia. 11 x 17 mm nodule identified in the left lung apex, not present on the previous remote study and concerning for primary bronchogenic neoplasm. No pleural effusion. Upper Abdomen: Unremarkable. Musculoskeletal: No worrisome lytic or sclerotic osseous abnormality. IMPRESSION: 1. Bibasilar patchy airspace disease suggests pneumonia. 2. 11 x 17 mm left apical nodule highly suspicious for primary bronchogenic neoplasm. 3.  Aortic Atherosclerois (ICD10-170.0) Electronically Signed   By: Misty Stanley M.D.   On: 10/04/2018 21:40     CBC Recent Labs  Lab 10/01/18 0821 10/04/18 1808 10/04/18 2133 10/05/18 0204 10/06/18 0154  WBC 11.7* 18.5* 17.9* 16.4* 13.5*  HGB 9.4* 9.2* 9.3* 8.5* 8.7*  HCT 31.0* 30.6* 29.8* 28.2* 28.0*  PLT 269 332 313 313 283  MCV 98.4 99.7 98.3 98.6 97.6  MCH 29.8 30.0 30.7 29.7 30.3  MCHC 30.3 30.1 31.2 30.1 31.1  RDW 14.6 14.8 14.8 14.8 14.9  LYMPHSABS  --  1.5  --  1.9 1.5  MONOABS  --  1.3*  --  1.1* 1.0  EOSABS  --  1.0*  --  1.0* 0.8*  BASOSABS  --  0.1  --  0.0 0.1    Chemistries  Recent Labs  Lab 10/01/18 0820 10/04/18 1808 10/04/18 2133 10/05/18 0204 10/06/18 0154  NA 138 140  --  141 139  K 4.3 4.8  --  5.0 4.1  CL 98 97*  --  102 97*  CO2 22 23  --  21* 27  GLUCOSE 99 116*  --  94 105*  BUN 73* 94*  --  106* 42*  CREATININE 11.81* 15.11* 15.51* 15.98* 8.46*  CALCIUM 9.4 9.4  --  8.9 8.6*  MG  --   --   --  2.4 2.2  AST  --  17  --   --   --   ALT  --  30  --   --   --   ALKPHOS  --  51  --   --   --   BILITOT  --  1.0  --    --   --     Lab Results  Component Value Date   HGBA1C 4.8 10/04/2018    Roxan Hockey M.D on 10/06/2018 at 4:09 PM  Pager---[phone] Go to [email or web address] - password [email or web address] for contact info  [facility] - Office  [phone]

## 2018-10-06 NOTE — Clinical Social Work Note (Signed)
Clinical Social Work Assessment  Patient Details  Name: James Nicholson MRN: [id number] Date of Birth: [birth year]  Date of referral:  10/06/18               Reason for consult:  Facility Placement, Discharge Planning                Permission sought to share information with:  Family Supports, Customer service manager Permission granted to share information::  Yes, Verbal Permission Granted  Name::     James Nicholson  Agency::  [facility]; [phone]  Relationship::  brother  Contact Information:  [phone]  Housing/Transportation Living arrangements for the past 2 months:  [place] of Information:  Patient, Siblings Patient Interpreter Needed:  None(pt mainly speaks [place] but is able to understand English and requests his brother support him when he does not understand) Criminal Activity/Legal Involvement Pertinent to Current Situation/Hospitalization:  No - Comment as needed Significant Relationships:  Other Family Members, Siblings, Community Support Lives with:  Facility Resident Do you feel safe going back to the place where you live?  Yes Need for family participation in patient care:  Yes (Comment)  Care giving concerns:  Pt is resident at group home, pt brother curious as to if he needs rehab before returning to group home.    Social Worker assessment / plan:  CSW spoke with pt and pt brother at bedside. Pt understands English but has difficulty speaking English and utilizes his brother to respond on his behalf- both speak [place]. [facility] from [facility] group [facility]. Pt has been living there for a few years and pt brother is okay with him returning. There was brief discussion regarding SNF placement as pt has been there [facility]) in the past. CSW explained SNF process and that I would request therapies to see pt.   CSW continuing to follow.   Employment status:  Disabled (Comment on whether or not currently  receiving Disability) Insurance information:  Medicaid In [place], [place] PT Recommendations:  Not assessed at this time Information / Referral to community resources:  [place]  Patient/Family's Response to care:  Pt amenable to speaking with CSW. Okay with returning to group home and interested in SNF if possible.   Patient/Family's Understanding of and Emotional Response to Diagnosis, Current Treatment, and Prognosis:  Pt and pt brother state understanding of diagnosis, current treatment and prognosis. Pt brother expresses frustration at healthcare systems in the [place] but concedes that we are all doing the best we can. Pt brother and pt emotionally appropriate throughout assessment.    Emotional Assessment Appearance:  Appears stated age Attitude/Demeanor/Rapport:  Gracious Affect (typically observed):  Accepting, Adaptable, Appropriate, Quiet Orientation:  Oriented to Self, Oriented to Place, Oriented to  Time, Oriented to Situation Alcohol / Substance use:  Not Applicable Psych involvement (Current and /or in the community):  No (Comment)  Discharge Needs  Concerns to be addressed:  Care Coordination, Discharge Planning Concerns Readmission within the last 30 days:  Yes Current discharge risk:  Physical Impairment, Chronically ill Barriers to Discharge:  Continued Medical Work up   [employer], [place] 10/06/2018, 2:58 PM

## 2018-10-06 NOTE — Consult Note (Signed)
Chief Complaint: Patient was seen in consultation today for left apical lung mass bx Chief Complaint  Patient presents with  . Cough   at the request of Dr Johnette Abraham Courage  Supervising Physician: Arne Cleveland  Patient Status: [facility] - In-pt  History of Present Illness: James Nicholson is a 52 y.o. male   ESRD Hx Wegeners disease - (pulm) HTN; DM; Cardiomyopathy Pneumonia-- rule out malignancy Dyspnea; recent tx for PNA Brother states pt has had bouts of confusion He is answering all questions appropriately today  10/04/18: CT:  IMPRESSION: 1. Bibasilar patchy airspace disease suggests pneumonia. 2. 11 x 17 mm left apical nodule highly suspicious for primary bronchogenic neoplasm. 3.  Aortic Atherosclerois (ICD10-170.0)  Request made for apical lung mass biopsy while inpt Dr Anselm Pancoast reviewed imaging and approves procedure  Pt and brother are agreeable to proceed Consent signed and in chart   Past Medical History:  Diagnosis Date  . Anemia   . Atrial fibrillation   . Cardiomyopathy ([place]) 2010   Unclear Etiology: Last Echo 06/2009: EF 40-45%, severe Lateral & apical Hypokinesis (? CAD) ; Grade 2 DDysfxn. Mild conc LVH.   Marland Kitchen Cellulitis   . Chronic kidney disease (CKD), stage V ([place])    Dialysis Tue, Thurs, Sat  . Dyslipidemia   . Enterobacter sepsis ([place])   . Hyperlipidemia   . Hypertension   . IDDM (insulin dependent diabetes mellitus) ([place]) 11/03/2014  . S/P ICD (internal cardiac defibrillator) procedure 2010   VT Arrest (in [place])  . Schizophrenia ([place])   . Wegener's disease, pulmonary ([place]) 02/05/2013    Past Surgical History:  Procedure Laterality Date  . AV FISTULA PLACEMENT Right 02/10/2013   Procedure: ARTERIOVENOUS (AV) FISTULA CREATION;  Surgeon: Rosetta Posner, MD;  Location: [facility] OR;  Service: Vascular;  Laterality: Right;  Right forearm radial/cephalic arterovenous fistula.   Marland Kitchen CARDIAC CATHETERIZATION  2010   [place]: In setting of VT arrest. Per brother's  report, nonobstructive with no intervention  . CARDIAC DEFIBRILLATOR PLACEMENT  2010   [place]  . FISTULOGRAM Right 08/09/2013   Procedure: FISTULOGRAM;  Surgeon: Angelia Mould, MD;  Location: [facility] CATH LAB;  Service: Cardiovascular;  Laterality: Right;  . ICD GENERATOR CHANGEOUT N/A 12/05/2017   Procedure: ICD GENERATOR CHANGEOUT;  Surgeon: Evans Lance, MD;  Location: [facility] CV LAB;  Service: Cardiovascular;  Laterality: N/A;  . LIGATION OF COMPETING BRANCHES OF ARTERIOVENOUS FISTULA Right 08/13/2013   Procedure: LIGATION OF COMPETING BRANCHES X5 OF ARTERIOVENOUS FISTULA- RIGHT ARM;  Surgeon: Serafina Mitchell, MD;  Location: [facility];  Service: Vascular;  Laterality: Right;    Allergies: Patient has no known allergies.  Medications: Prior to Admission medications   Medication Sig Start Date End Date Taking? Authorizing Provider  calcium acetate (PHOSLO) 667 MG capsule Take 1,334 mg by mouth 3 (three) times daily with meals. 8a, 12p, 5p 02/11/13  Yes Regalado, Belkys A, MD  ciprofloxacin (CILOXAN) 0.3 % ophthalmic solution Place 1 drop into the right eye every 4 (four) hours while awake for 5 days. 10/01/18 10/06/18 Yes Elgergawy, Silver Huguenin, MD  gabapentin (NEURONTIN) 300 MG capsule Take 300 mg by mouth 2 (two) times daily.   Yes [provider]  gemfibrozil (LOPID) 600 MG tablet Take 600 mg by mouth 2 (two) times daily before a meal.   Yes [provider]  insulin detemir (LEVEMIR) 100 UNIT/ML injection Inject 0.05 mLs (5 Units total) into the skin at bedtime. 02/11/13  Yes Regalado, [employer]  A, MD  metoprolol succinate (TOPROL-XL) 50 MG 24 hr tablet Take 50 mg by mouth daily. Take with or immediately following a meal.    Yes [provider]  omeprazole (PRILOSEC) 20 MG capsule Take 20 mg by mouth every morning.    Yes [provider]  risperiDONE (RISPERDAL) 0.25 MG tablet Take 0.25 mg by mouth at bedtime.   Yes [provider]  traZODone  (DESYREL) 100 MG tablet Take 100 mg by mouth at bedtime.   Yes [provider]     Family History  Problem Relation Age of Onset  . CAD Mother     Social History   Socioeconomic History  . Marital status: Single    Spouse name: Not on file  . Number of children: Not on file  . Years of education: Not on file  . Highest education level: Not on file  Occupational History  . Not on file  Social Needs  . Financial resource strain: Not on file  . Food insecurity:    Worry: Not on file    Inability: Not on file  . Transportation needs:    Medical: Not on file    Non-medical: Not on file  Tobacco Use  . Smoking status: Former Smoker    Last attempt to quit: 10/28/2008    Years since quitting: 9.9  . Smokeless tobacco: Never Used  Substance and Sexual Activity  . Alcohol use: No  . Drug use: No  . Sexual activity: Not on file  Lifestyle  . Physical activity:    Days per week: Not on file    Minutes per session: Not on file  . Stress: Not on file  Relationships  . Social connections:    Talks on phone: Not on file    Gets together: Not on file    Attends religious service: Not on file    Active member of club or organization: Not on file    Attends meetings of clubs or organizations: Not on file    Relationship status: Not on file  Other Topics Concern  . Not on file  Social History Narrative  . Not on file     Review of Systems: A 12 point ROS discussed and pertinent positives are indicated in the HPI above.  All other systems are negative.  Review of Systems  Constitutional: Positive for activity change, fatigue and fever. Negative for appetite change and unexpected weight change.  Respiratory: Positive for cough. Negative for shortness of breath.   Cardiovascular: Negative for chest pain.  Neurological: Positive for weakness.  Psychiatric/Behavioral: Positive for decreased concentration.    Vital Signs: BP 130/84 (BP Location: Left Arm)   Pulse 97    Temp 97.9 F (36.6 C) (Oral)   Resp 15   Wt 174 lb 2.6 oz (79 kg)   SpO2 95%   BMI 25.72 kg/m   Physical Exam  Constitutional: He is oriented to person, place, and time.  Cardiovascular: Normal rate, regular rhythm and normal heart sounds.  Pulmonary/Chest: Effort normal and breath sounds normal.  Abdominal: Soft. Bowel sounds are normal.  Musculoskeletal: Normal range of motion.  Neurological: He is alert and oriented to person, place, and time.  Skin: Skin is warm and dry.  Psychiatric: He has a normal mood and affect. His behavior is normal. Judgment and thought content normal.  Brother at bedside Speaks [place] note and vitals reviewed.   Imaging: Dg Chest 2 View  Result Date: 10/04/2018 CLINICAL  DATA:  Cough, pneumonia, blood in sputum EXAM: CHEST - 2 VIEW COMPARISON:  09/27/2017 FINDINGS: Low lung volumes. Bibasilar atelectasis is suspected. No focal consolidation. Left lower hemithorax obscured. No pleural effusion or pneumothorax. Cardiomegaly.  Left subclavian ICD. IMPRESSION: Low lung volumes with probable bibasilar atelectasis. Electronically Signed   By: Julian Hy M.D.   On: 10/04/2018 19:34   Dg Chest 2 View  Result Date: 09/27/2018 CLINICAL DATA:  Chest pain and shortness of breath.  Cough. EXAM: CHEST - 2 VIEW COMPARISON:  11/03/2014 FINDINGS: Stable position of the cardiac pacemaker. Enlarged cardiac silhouette.  Mediastinal contours appear intact. There is no evidence of focal pleural effusion or pneumothorax. Elevation of the right hemidiaphragm likely due to volume loss in the right hemithorax. Subtle peribronchial bilateral lower lobe opacities. Osseous structures are without acute abnormality. Soft tissues are grossly normal. IMPRESSION: Bilateral peribronchial lower lobe airspace opacities which may represent atelectasis or airspace consolidation. Electronically Signed   By: Fidela Salisbury M.D.   On: 09/27/2018 10:00   Ct Chest Wo  Contrast  Result Date: 10/04/2018 CLINICAL DATA:  Pneumonia. EXAM: CT CHEST WITHOUT CONTRAST TECHNIQUE: Multidetector CT imaging of the chest was performed following the standard protocol without IV contrast. COMPARISON:  04/12/2013 FINDINGS: Cardiovascular: Heart is enlarged. No substantial pericardial effusion. Permanent pacemaker noted. Coronary artery calcification is evident. Mediastinum/Nodes: No mediastinal lymphadenopathy. No evidence for gross hilar lymphadenopathy although assessment is limited by the lack of intravenous contrast on today's study. The esophagus has normal imaging features. There is no axillary lymphadenopathy. Lungs/Pleura: The central tracheobronchial airways are patent. Patchy airspace disease noted in the lower lobes bilaterally suggests pneumonia. 11 x 17 mm nodule identified in the left lung apex, not present on the previous remote study and concerning for primary bronchogenic neoplasm. No pleural effusion. Upper Abdomen: Unremarkable. Musculoskeletal: No worrisome lytic or sclerotic osseous abnormality. IMPRESSION: 1. Bibasilar patchy airspace disease suggests pneumonia. 2. 11 x 17 mm left apical nodule highly suspicious for primary bronchogenic neoplasm. 3.  Aortic Atherosclerois (ICD10-170.0) Electronically Signed   By: Misty Stanley M.D.   On: 10/04/2018 21:40    Labs:  CBC: Recent Labs    10/04/18 1808 10/04/18 2133 10/05/18 0204 10/06/18 0154  WBC 18.5* 17.9* 16.4* 13.5*  HGB 9.2* 9.3* 8.5* 8.7*  HCT 30.6* 29.8* 28.2* 28.0*  PLT 332 313 313 283    COAGS: Recent Labs    11/28/17 1050 10/06/18 0154  INR 1.0 1.33  APTT 27  --     BMP: Recent Labs    10/01/18 0820 10/04/18 1808 10/04/18 2133 10/05/18 0204 10/06/18 0154  NA 138 140  --  141 139  K 4.3 4.8  --  5.0 4.1  CL 98 97*  --  102 97*  CO2 22 23  --  21* 27  GLUCOSE 99 116*  --  94 105*  BUN 73* 94*  --  106* 42*  CALCIUM 9.4 9.4  --  8.9 8.6*  CREATININE 11.81* 15.11* 15.51* 15.98*  8.46*  GFRNONAA 4* 3* 3* 3* 7*  GFRAA 5* 4* 4* 4* 8*    LIVER FUNCTION TESTS: Recent Labs    09/27/18 0856 09/29/18 0831 10/01/18 0820 10/04/18 1808  BILITOT 1.1  --   --  1.0  AST 12*  --   --  17  ALT 11  --   --  30  ALKPHOS 55  --   --  51  PROT 7.8  --   --  7.7  ALBUMIN 3.2* 2.7* 2.6* 2.6*    TUMOR MARKERS: No results for input(s): AFPTM, CEA, CA199, CHROMGRNA in the last 8760 hours.  Assessment and Plan:  Left apical lung mass Recent PNA Ongoing  treatment Worrisome for malignancy Pulm CCM-- not amenable to Bronchoscopy Scheduled for needle biopsy in Radiology today Risks and benefits discussed with the patient including, but not limited to bleeding, hemoptysis, respiratory failure requiring intubation, infection, pneumothorax requiring chest tube placement, stroke from air embolism or even death.  All of the patient's questions were answered, patient is agreeable to proceed. Consent signed and in chart.   Thank you for this interesting consult.  I greatly enjoyed meeting James Nicholson and look forward to participating in their care.  A copy of this report was sent to the requesting provider on this date.  Electronically Signed: Lavonia Drafts, PA-C 10/06/2018, 8:50 AM   I spent a total of 40 Minutes    in face to face in clinical consultation, greater than 50% of which was counseling/coordinating care for left apical lung mass bx

## 2018-10-06 NOTE — Progress Notes (Signed)
[facility] Progress Note   Subjective:  Seen in room. Still coughing, brother reports coughing to the point of vomiting at times. Going to IR today for needle biopsy of apical lung mass. Denies CP. Did ok with dialysis yesterday. R eye looks a little better.  Objective Vitals:   10/05/18 2140 10/05/18 2147 10/06/18 0018 10/06/18 0521  BP:   114/73 130/84  Pulse: (!) 119 (!) 125 (!) 109 97  Resp:  (!) 21 19 15   Temp:   99 F (37.2 C) 97.9 F (36.6 C)  TempSrc:   Oral Oral  SpO2: 91% 93% 97% 95%  Weight:       Physical Exam General: Ill appearing man, NAD. R eye with red conjunctiva, eyelid edema resolved. Heart: RRR; 2/6 systolic murmur Lungs: Diffuse wheezing/rhonchi throughout all lung fields Abdomen: Soft, non-tender Extremities: No LE edema Dialysis Access: AVF + thrill  Additional Objective Labs: Basic Metabolic Panel: Recent Labs  Lab 10/01/18 0820 10/04/18 1808 10/04/18 2133 10/05/18 0204 10/06/18 0154  NA 138 140  --  141 139  K 4.3 4.8  --  5.0 4.1  CL 98 97*  --  102 97*  CO2 22 23  --  21* 27  GLUCOSE 99 116*  --  94 105*  BUN 73* 94*  --  106* 42*  CREATININE 11.81* 15.11* 15.51* 15.98* 8.46*  CALCIUM 9.4 9.4  --  8.9 8.6*  PHOS 5.1*  --   --   --   --    Liver Function Tests: Recent Labs  Lab 10/01/18 0820 10/04/18 1808  AST  --  17  ALT  --  30  ALKPHOS  --  51  BILITOT  --  1.0  PROT  --  7.7  ALBUMIN 2.6* 2.6*   CBC: Recent Labs  Lab 10/01/18 0821 10/04/18 1808 10/04/18 2133 10/05/18 0204 10/06/18 0154  WBC 11.7* 18.5* 17.9* 16.4* 13.5*  NEUTROABS  --  14.5*  --  12.3* 10.0*  HGB 9.4* 9.2* 9.3* 8.5* 8.7*  HCT 31.0* 30.6* 29.8* 28.2* 28.0*  MCV 98.4 99.7 98.3 98.6 97.6  PLT 269 332 313 313 283   Blood Culture    Component Value Date/Time   SDES BLOOD LEFT FOREARM 10/04/2018 2050   SPECREQUEST  10/04/2018 2050    BOTTLES DRAWN AEROBIC AND ANAEROBIC Blood Culture adequate volume   CULT  10/04/2018 2050    NO  GROWTH < 24 HOURS Performed at [facility], [place] [address], [place], [place] [address]    REPTSTATUS PENDING 10/04/2018 2050   Studies/Results: Dg Chest 2 View  Result Date: 10/04/2018 CLINICAL DATA:  Cough, pneumonia, blood in sputum EXAM: CHEST - 2 VIEW COMPARISON:  09/27/2017 FINDINGS: Low lung volumes. Bibasilar atelectasis is suspected. No focal consolidation. Left lower hemithorax obscured. No pleural effusion or pneumothorax. Cardiomegaly.  Left subclavian ICD. IMPRESSION: Low lung volumes with probable bibasilar atelectasis. Electronically Signed   By: Julian Hy M.D.   On: 10/04/2018 19:34   Ct Chest Wo Contrast  Result Date: 10/04/2018 CLINICAL DATA:  Pneumonia. EXAM: CT CHEST WITHOUT CONTRAST TECHNIQUE: Multidetector CT imaging of the chest was performed following the standard protocol without IV contrast. COMPARISON:  04/12/2013 FINDINGS: Cardiovascular: Heart is enlarged. No substantial pericardial effusion. Permanent pacemaker noted. Coronary artery calcification is evident. Mediastinum/Nodes: No mediastinal lymphadenopathy. No evidence for gross hilar lymphadenopathy although assessment is limited by the lack of intravenous contrast on today's study. The esophagus has normal imaging features. There  is no axillary lymphadenopathy. Lungs/Pleura: The central tracheobronchial airways are patent. Patchy airspace disease noted in the lower lobes bilaterally suggests pneumonia. 11 x 17 mm nodule identified in the left lung apex, not present on the previous remote study and concerning for primary bronchogenic neoplasm. No pleural effusion. Upper Abdomen: Unremarkable. Musculoskeletal: No worrisome lytic or sclerotic osseous abnormality. IMPRESSION: 1. Bibasilar patchy airspace disease suggests pneumonia. 2. 11 x 17 mm left apical nodule highly suspicious for primary bronchogenic neoplasm. 3.  Aortic Atherosclerois (ICD10-170.0) Electronically Signed   By: Misty Stanley M.D.   On:  10/04/2018 21:40   Medications: . ceFEPime (MAXIPIME) IV 1 g (10/05/18 2015)  . vancomycin 750 mg (10/05/18 1626)   . calcitRIOL  1 mcg Oral Q M,W,F-HD  . calcium acetate  1,334 mg Oral TID WC  . Chlorhexidine Gluconate Cloth  6 each Topical Q0600  . ciprofloxacin  1 drop Right Eye Q4H while awake  . gabapentin  300 mg Oral BID  . insulin aspart  0-5 Units Subcutaneous QHS  . insulin aspart  0-9 Units Subcutaneous TID WC  . risperiDONE  0.25 mg Oral QHS  . traZODone  100 mg Oral QHS    Dialysis Orders: MWF at [facility] 4h  400/800  77.5kg  2/2 bath  R AVF  Hep 7600 - Mircera 75 every 2 wks (looks like ordered after last admit, but not given) - Calcitriol 17mcg PO q HD  Assessment/Plan: 1. HCAP (recurrent/unresolved): On IV Vanc/Cefepime. Still diffusely wheezing, will add albuterol q 6hr scheduled. 2. ESRD: Continue HD per usual MWF schedule, next 12/11. 3. HTN/volume: BP ok, no edema on exam. Keeping same EDW. 4. Anemia: Hgb 8.7, due for ESA, but awaiting prelim lung nodule Bx at this point. 5. Secondary hyperparathyroidism: Ca/Phos ok. Continue home meds. 6. Nutrition: Alb very low, adding Pro-stat BID. 7. L Lung nodule: For needle Bx with IR today, concerning for malignancy per CT.   Veneta Penton, PA-C 10/06/2018, 10:03 AM  [name] Pager: [phone]

## 2018-10-06 NOTE — Social Work (Signed)
Pt is from [place]. Per previous notes from previous admission pt is from [facility] [address].   Westley Hummer, MSW, [place] Work [phone]

## 2018-10-07 ENCOUNTER — Other Ambulatory Visit: Payer: Self-pay

## 2018-10-07 ENCOUNTER — Inpatient Hospital Stay (HOSPITAL_COMMUNITY): Payer: Medicare Other

## 2018-10-07 ENCOUNTER — Encounter (HOSPITAL_COMMUNITY): Payer: Self-pay | Admitting: General Practice

## 2018-10-07 DIAGNOSIS — Z9889 Other specified postprocedural states: Secondary | ICD-10-CM

## 2018-10-07 DIAGNOSIS — R05 Cough: Secondary | ICD-10-CM

## 2018-10-07 DIAGNOSIS — D72829 Elevated white blood cell count, unspecified: Secondary | ICD-10-CM

## 2018-10-07 LAB — BASIC METABOLIC PANEL
Anion gap: 17 — ABNORMAL HIGH (ref 5–15)
BUN: 68 mg/dL — ABNORMAL HIGH (ref 6–20)
CO2: 26 mmol/L (ref 22–32)
Calcium: 9.1 mg/dL (ref 8.9–10.3)
Chloride: 95 mmol/L — ABNORMAL LOW (ref 98–111)
Creatinine, Ser: 11.73 mg/dL — ABNORMAL HIGH (ref 0.61–1.24)
GFR calc Af Amer: 5 mL/min — ABNORMAL LOW (ref >60)
GFR calc non Af Amer: 4 mL/min — ABNORMAL LOW (ref >60)
Glucose, Bld: 124 mg/dL — ABNORMAL HIGH (ref 70–99)
Potassium: 4.4 mmol/L (ref 3.5–5.1)
Sodium: 138 mmol/L (ref 135–145)

## 2018-10-07 LAB — CBC
HCT: 27.5 % — ABNORMAL LOW (ref 39.0–52.0)
Hemoglobin: 8.5 g/dL — ABNORMAL LOW (ref 13.0–17.0)
MCH: 30 pg (ref 26.0–34.0)
MCHC: 30.9 g/dL (ref 30.0–36.0)
MCV: 97.2 fL (ref 80.0–100.0)
Platelets: 253 10*3/uL (ref 150–400)
RBC: 2.83 MIL/uL — ABNORMAL LOW (ref 4.22–5.81)
RDW: 14.8 % (ref 11.5–15.5)
WBC: 10.9 10*3/uL — ABNORMAL HIGH (ref 4.0–10.5)
nRBC: 0 % (ref 0.0–0.2)

## 2018-10-07 LAB — GLUCOSE, CAPILLARY
Glucose-Capillary: 100 mg/dL — ABNORMAL HIGH (ref 70–99)
Glucose-Capillary: 130 mg/dL — ABNORMAL HIGH (ref 70–99)
Glucose-Capillary: 118 mg/dL — ABNORMAL HIGH (ref 70–99)

## 2018-10-07 LAB — MAGNESIUM: Magnesium: 2.4 mg/dL (ref 1.7–2.4)

## 2018-10-07 MED ORDER — MIDAZOLAM HCL 2 MG/2ML IJ SOLN
INTRAMUSCULAR | Status: AC
  Filled 2018-10-07: qty 2

## 2018-10-07 MED ORDER — LIDOCAINE HCL 1 % IJ SOLN
INTRAMUSCULAR | Status: AC
  Filled 2018-10-07: qty 20

## 2018-10-07 MED ORDER — FENTANYL CITRATE (PF) 100 MCG/2ML IJ SOLN
INTRAMUSCULAR | Status: AC | PRN
  Administered 2018-10-07: 50 ug via INTRAVENOUS

## 2018-10-07 MED ORDER — ALBUTEROL SULFATE (2.5 MG/3ML) 0.083% IN NEBU: 2.5000 mg | INHALATION_SOLUTION | Freq: Three times a day (TID) | RESPIRATORY_TRACT | Status: DC

## 2018-10-07 MED ORDER — HEPARIN SODIUM (PORCINE) 5000 UNIT/ML IJ SOLN
5000.0000 [IU] | Freq: Three times a day (TID) | INTRAMUSCULAR | Status: DC
  Administered 2018-10-07 – 2018-10-16 (×25): 5000 [IU] via SUBCUTANEOUS
  Filled 2018-10-07 (×26): qty 1

## 2018-10-07 MED ORDER — FENTANYL CITRATE (PF) 100 MCG/2ML IJ SOLN
INTRAMUSCULAR | Status: AC
  Filled 2018-10-07: qty 2

## 2018-10-07 MED ORDER — ALBUTEROL SULFATE (2.5 MG/3ML) 0.083% IN NEBU
2.5000 mg | INHALATION_SOLUTION | Freq: Three times a day (TID) | RESPIRATORY_TRACT | Status: DC
  Administered 2018-10-07: 2.5 mg via RESPIRATORY_TRACT
  Filled 2018-10-07: qty 3

## 2018-10-07 MED ORDER — MIDAZOLAM HCL 2 MG/2ML IJ SOLN
INTRAMUSCULAR | Status: AC | PRN
  Administered 2018-10-07: 1 mg via INTRAVENOUS

## 2018-10-07 NOTE — Procedures (Signed)
Interventional Radiology Procedure Note  Procedure: CT guided biopsy of left upper lobe pulmonary nodule.  Complications: No immediate Recommendations: - Bedrest until CXR cleared.  Minimize talking, coughing or otherwise straining.  - Follow up 1 hr CXR pending   Signed,  Criselda Peaches, MD

## 2018-10-07 NOTE — Progress Notes (Signed)
Pharmacy Antibiotic Note  James Nicholson is a 52 y.o. male admitted on 10/04/2018 with pneumonia.  Pharmacy has been consulted for Vancomycin dosing.   The patient is ESRD-MWF and has been loaded with Vancomycin and receiving appropriate maintenance doses. Current dosing remains appropriate. Today is abx D#4.   Plan: - Cont Vancomycin 750mg  IV qHD MWF - Cont Cefepime 1g IV q24h - Will continue to follow HD schedule/duration, culture results, LOT, and antibiotic de-escalation plans   Weight: 174 lb 2.6 oz (79 kg)  Temp (24hrs), Avg:98.4 F (36.9 C), Min:97.7 F (36.5 C), Max:99.1 F (37.3 C)  Recent Labs  Lab 10/04/18 1808 10/04/18 1816 10/04/18 2133 10/05/18 0204 10/06/18 0154 10/07/18 0148  WBC 18.5*  --  17.9* 16.4* 13.5* 10.9*  CREATININE 15.11*  --  15.51* 15.98* 8.46* 11.73*  LATICACIDVEN  --  0.97  --   --   --   --     Estimated Creatinine Clearance: 7.5 mL/min (A) (by C-G formula based on SCr of 11.73 mg/dL (H)).    No Known Allergies  Thank you for allowing pharmacy to be a part of this patient's care.  Alycia Rossetti, PharmD, BCPS Clinical Pharmacist Pager: [phone] Clinical phone for 10/07/2018 from 7a-3:30p: [phone] If after 3:30p, please call main pharmacy at: [phone] Please check AMION for all [place] numbers 10/07/2018 8:08 AM

## 2018-10-07 NOTE — Progress Notes (Signed)
Patient taken to dialysis. 

## 2018-10-07 NOTE — Progress Notes (Addendum)
[facility] Progress Note   Subjective:  Seen on HD now, 2L UF goal planned. Denies CP, still coughing and wheezing. S/p pulm nodule Bx by IR this morning, path pending.   Objective Vitals:   10/07/18 0845 10/07/18 0850 10/07/18 0855 10/07/18 0900  BP: 127/82 123/80 128/80 126/80  Pulse: (!) 105 (!) 105 (!) 107 (!) 106  Resp:      Temp:      TempSrc:      SpO2: 94% 93% 95% 93%  Weight:       Physical Exam General: Ill appearing man, NAD. R eye with red/edematous conjunctiva, eyelid edema resolved. Heart: RRR; 2/6 systolic murmur Lungs: Diffuse wheezing/rhonchi throughout all lung fields Abdomen: Soft, non-tender Extremities: No LE edema Dialysis Access: AVF + thrill  Additional Objective Labs: Basic Metabolic Panel: Recent Labs  Lab 10/01/18 0820  10/05/18 0204 10/06/18 0154 10/07/18 0148  NA 138   < > 141 139 138  K 4.3   < > 5.0 4.1 4.4  CL 98   < > 102 97* 95*  CO2 22   < > 21* 27 26  GLUCOSE 99   < > 94 105* 124*  BUN 73*   < > 106* 42* 68*  CREATININE 11.81*   < > 15.98* 8.46* 11.73*  CALCIUM 9.4   < > 8.9 8.6* 9.1  PHOS 5.1*  --   --   --   --    < > = values in this interval not displayed.   Liver Function Tests: Recent Labs  Lab 10/01/18 0820 10/04/18 1808  AST  --  17  ALT  --  30  ALKPHOS  --  51  BILITOT  --  1.0  PROT  --  7.7  ALBUMIN 2.6* 2.6*   CBC: Recent Labs  Lab 10/04/18 1808 10/04/18 2133 10/05/18 0204 10/06/18 0154 10/07/18 0148  WBC 18.5* 17.9* 16.4* 13.5* 10.9*  NEUTROABS 14.5*  --  12.3* 10.0*  --   HGB 9.2* 9.3* 8.5* 8.7* 8.5*  HCT 30.6* 29.8* 28.2* 28.0* 27.5*  MCV 99.7 98.3 98.6 97.6 97.2  PLT 332 313 313 283 253   Blood Culture    Component Value Date/Time   SDES BLOOD LEFT HAND 10/06/2018 1630   SPECREQUEST  10/06/2018 1630    BOTTLES DRAWN AEROBIC ONLY Blood Culture results may not be optimal due to an inadequate volume of blood received in culture bottles   CULT  10/06/2018 1630    NO GROWTH  < 24 HOURS Performed at [facility] [address], [place], [place] [address]    REPTSTATUS PENDING 10/06/2018 1630   Studies/Results: Ct Biopsy  Result Date: 10/07/2018 INDICATION: 52 year old male with a history of granulomatosis with polyangiitis and a left apical pulmonary nodule. Differential considerations include granulomatous nodule, and possibly primary bronchogenic carcinoma. He presents for CT-guided biopsy of the same. EXAM: CT-guided biopsy left upper lobe pulmonary nodule Interventional Radiologist:  Criselda Peaches, MD MEDICATIONS: None. ANESTHESIA/SEDATION: Fentanyl 1 mcg IV; Versed 50 mg IV Moderate Sedation Time:  13 minutes The patient was continuously monitored during the procedure by the interventional radiology nurse under my direct supervision. FLUOROSCOPY TIME:  None. COMPLICATIONS: None immediate. Estimated blood loss:  0 PROCEDURE: Informed written consent was obtained from the patient after a thorough discussion of the procedural risks, benefits and alternatives. All questions were addressed. Maximal Sterile Barrier Technique was utilized including caps, mask, sterile gowns, sterile gloves, sterile drape, hand hygiene and  skin antiseptic. A timeout was performed prior to the initiation of the procedure. A planning axial CT scan was performed. The nodule in the left lung apex was successfully identified. A suitable skin entry site was selected and marked. The region was then sterilely prepped and draped in standard fashion using Betadine skin prep. Local anesthesia was attained by infiltration with 1% lidocaine. A small dermatotomy was made. Under intermittent CT fluoroscopic guidance, a 17 gauge trocar needle was advanced into the lung and positioned at the margin of the nodule. Multiple 18 gauge core biopsies were then coaxially obtained using the BioPince automated biopsy device. Biopsy specimens were placed in formalin and delivered to pathology for further  analysis. The biopsy device and introducer needle were removed and a bio sentry device was deployed. Post biopsy axial CT imaging demonstrates no evidence of immediate complication. There is no pneumothorax. Mild perilesional alveolar hemorrhage is not unexpected. The patient tolerated the procedure well. IMPRESSION: Technically successful CT-guided biopsy left apical pulmonary nodule. Electronically Signed   By: Jacqulynn Cadet M.D.   On: 10/07/2018 10:47   Dg Chest Port 1 View  Result Date: 10/07/2018 CLINICAL DATA:  Status post left lung biopsy. EXAM: PORTABLE CHEST 1 VIEW COMPARISON:  Procedural images from CT-guided biopsy earlier today. Chest radiographs and CT 10/04/2018. FINDINGS: A single lead ICD remains in place. The cardiac silhouette remains enlarged. Lung volumes are low with similar appearance of patchy bibasilar airspace opacities. Small nodular density is again seen in the left lung apex. No pneumothorax or sizable pleural effusion is identified. IMPRESSION: 1. No pneumothorax. 2. Low lung volumes with persistent bibasilar atelectasis or pneumonia. Electronically Signed   By: Logan Bores M.D.   On: 10/07/2018 12:02   Medications: . ceFEPime (MAXIPIME) IV 1 g (10/06/18 1951)  . vancomycin 750 mg (10/05/18 1626)   . albuterol  2.5 mg Nebulization TID  . calcitRIOL  1 mcg Oral Q M,W,F-HD  . calcium acetate  1,334 mg Oral TID WC  . Chlorhexidine Gluconate Cloth  6 each Topical Q0600  . ciprofloxacin  1 drop Right Eye Q4H while awake  . feeding supplement (PRO-STAT SUGAR FREE 64)  30 mL Oral BID  . fentaNYL      . gabapentin  300 mg Oral BID  . heparin injection (subcutaneous)  5,000 Units Subcutaneous Q8H  . insulin aspart  0-5 Units Subcutaneous QHS  . insulin aspart  0-9 Units Subcutaneous TID WC  . lidocaine      . midazolam      . risperiDONE  0.25 mg Oral QHS  . traZODone  100 mg Oral QHS    Dialysis Orders: MWF at [facility] 4h 400/800 77.5kg 2/2 bath R AVF Hep  7600 - Mircera 75 every 2 wks (looks like ordered after last admit, but not given) - Calcitriol 74mcg PO q HD  Assessment/Plan: 1. HCAP (recurrent/unresolved): On IV Vanc/Cefepime. Still diffusely wheezing. 2. ESRD: Continue HD per usual MWF schedule, HD today, 2L goal. 3. HTN/volume: BP ok, no edema on exam. Keeping same EDW. 4. Anemia: Hgb 8.5, due for ESA, but awaiting prelim lung nodule Bx at this point (would be contraindicated if + malignancy) 5. Secondary hyperparathyroidism: Ca/Phos ok. Continue home meds. 6. Nutrition: Alb very low, continue Pro-stat BID. 7. L Lung nodule: S/p needle Bx with IR today, concerning for malignancy per CT.  Veneta Penton, PA-C 10/07/2018, 2:45 PM  [facility] Pager: [phone]  Pt seen, examined and agree w A/P as above.  Kelly Splinter MD [employer] pager [phone]   10/07/2018, 5:55 PM

## 2018-10-07 NOTE — Progress Notes (Signed)
Pt having increased dizziness w/coughing w/use of incentive spirometer. No change with chest pain. No other issues noted.

## 2018-10-07 NOTE — Progress Notes (Signed)
PROGRESS NOTE  James Nicholson IWP:[id number] DOB: 22-Nov-1965 DOA: 10/04/2018 PCP: [name], MD  HPI/Brief Narrative  James Nicholson is a 52 y.o. year old male of [place] descent with medical history significant for ESRD on HD, granulomatosis polyangiitis (diagnosed 2006), schizophrenia, history of VT arrest status post ICD (2010) who presented on 10/04/2018 with worsening cough, dyspnea and blood-tinged sputum after recent hospitalization and discharge (12/1-12/02/2018) for presumed H CAP for which he empirically received IV ceftriaxone and azithromycin and was discharged on cefdinir and azithromycin to complete 7-day course.    Subjective Does report some mild pain in right eye that has been ongoing for "several months". Still having cough   Assessment/Plan:  #Presumed HCAP.  Currently treating for presumed infectious etiology given bibasilar airspace disease on CT chest and repeat chest x-ray today.  Patient failed outpatient treatment so currently on empiric IV vancomycin, cefepime.  Supportive care with albuterol nebs, flutter valve, incentive spirometry.  Other etiologies on differential includes granulomatosis polyangiitis flare versus underlying malignancy if continues to show no improvement with antibiotics and may warrant wound/pulmonary consultation.  Monitor blood cultures.  Currently not requiring oxygen  #Sepsis syndrome, resolved. Presented with tachypnea, elevated white count and concern bilateral airspace disease initial imaging concerning for pneumonia with sepsis syndrome.  White count is slowly improved, patient still having elevated respiratory rate but currently treating for presumed pneumonia  #Left apical pulmonary nodule.  Concern for possible primary bronchogenic neoplasm.  Status post CT-guided lung biopsy on 12/11 by IR,  #Right eye redness.  Reports ongoing for a month.  Will discontinue empiric Cipro eyedrops (started on last admission) for presumed  conjunctivitis, formally consult ophthalmology on 12/12 given patient reports ongoing pain during the same and diminished vision  #ESRD on HD, Monday Wednesday Friday.  Appreciate nephrology following and managing dialysis sessions.  Continue PhosLo 3 times daily  #Type 2 diabetes.  A1c 4.8%.  Holding home Levemir, monitor CBGs on sliding scale insulin as needed low A1c do not suspect he will need to continue Levemir on discharge  #Schizophrenia, stable.  Continue home Risperdal and trazodone  #History of granulomatosis polyangiitis with renal disease.  Unclear if this is a current flare, currently treating empirically for infection.  #Peripheral neuropathy in lower extremities.  Continue home gabapentin  #Normocytic anemia, anemia of chronic disease (ESRD).  Hemoglobin stable at baseline.  Code Status: Full code  Family Communication: No family at bedside  Disposition Plan: Continues to require IV antibiotics for presumed H CAP, may warrant home/rheumatology consultation, will need hospital evaluation for significant red eye redness   Consultants:   Treatment Team:   Rush Farmer, MD  Roney Jaffe, MD    Procedures:  Ct guided pulm nodule biopsy 12/11   Antimicrobials: Anti-infectives (From admission, onward)   Start     Dose/Rate Route Frequency Ordered Stop   10/05/18 1200  vancomycin (VANCOCIN) IVPB 750 mg/150 ml premix     750 mg 150 mL/hr over 60 Minutes Intravenous Every M-W-F (Hemodialysis) 10/04/18 2049     10/04/18 2100  ceFEPIme (MAXIPIME) 1 g in sodium chloride 0.9 % 100 mL IVPB     1 g 200 mL/hr over 30 Minutes Intravenous Every 24 hours 10/04/18 2039     10/04/18 2100  vancomycin (VANCOCIN) 1,750 mg in sodium chloride 0.9 % 500 mL IVPB     1,750 mg 250 mL/hr over 120 Minutes Intravenous  Once 10/04/18 2047 10/05/18 0137         Cultures:  12/10 x 1 blood culture, no growth, 12/8 x 2 blood cultures no growth to date  Telemetry: None  DVT  prophylaxis: Heparin prophylaxis   Objective: Vitals:   10/07/18 1530 10/07/18 1600 10/07/18 1630 10/07/18 1700  BP: 120/78 121/75 114/79 115/81  Pulse: 96 100 (!) 101 (!) 104  Resp: 18 16 16 16   Temp:      TempSrc:      SpO2:      Weight:        Intake/Output Summary (Last 24 hours) at 10/07/2018 1733 Last data filed at 10/07/2018 0900 Gross per 24 hour  Intake 360 ml  Output -  Net 360 ml   Filed Weights   10/05/18 1245 10/05/18 1702 10/07/18 1415  Weight: 81 kg 79 kg 78.8 kg    Exam:  Constitutional: Normal appearing male, lying in bed, no distress Eyes:  diffuse redness of entire right sclera with some swelling, ocular movement still intact, ENMT: Oropharynx with moist mucous membranes, normal dentition Cardiovascular:  no peripheral edema Respiratory: Diffuse wheezing, no accessory muscle use, on room air Abdomen: Soft,non-tender, with no HSM Skin: No rash ulcers, or lesions. Without skin tenting  Neurologic: Grossly no focal neuro deficit. Psychiatric:Appropriate affect, and mood.   Data Reviewed: CBC: Recent Labs  Lab 10/04/18 1808 10/04/18 2133 10/05/18 0204 10/06/18 0154 10/07/18 0148  WBC 18.5* 17.9* 16.4* 13.5* 10.9*  NEUTROABS 14.5*  --  12.3* 10.0*  --   HGB 9.2* 9.3* 8.5* 8.7* 8.5*  HCT 30.6* 29.8* 28.2* 28.0* 27.5*  MCV 99.7 98.3 98.6 97.6 97.2  PLT 332 313 313 283 532   Basic Metabolic Panel: Recent Labs  Lab 10/01/18 0820 10/04/18 1808 10/04/18 2133 10/05/18 0204 10/06/18 0154 10/07/18 0148  NA 138 140  --  141 139 138  K 4.3 4.8  --  5.0 4.1 4.4  CL 98 97*  --  102 97* 95*  CO2 22 23  --  21* 27 26  GLUCOSE 99 116*  --  94 105* 124*  BUN 73* 94*  --  106* 42* 68*  CREATININE 11.81* 15.11* 15.51* 15.98* 8.46* 11.73*  CALCIUM 9.4 9.4  --  8.9 8.6* 9.1  MG  --   --   --  2.4 2.2 2.4  PHOS 5.1*  --   --   --   --   --    GFR: Estimated Creatinine Clearance: 7.5 mL/min (A) (by C-G formula based on SCr of 11.73 mg/dL  (H)). Liver Function Tests: Recent Labs  Lab 10/01/18 0820 10/04/18 1808  AST  --  17  ALT  --  30  ALKPHOS  --  51  BILITOT  --  1.0  PROT  --  7.7  ALBUMIN 2.6* 2.6*   No results for input(s): LIPASE, AMYLASE in the last 168 hours. No results for input(s): AMMONIA in the last 168 hours. Coagulation Profile: Recent Labs  Lab 10/06/18 0154  INR 1.33   Cardiac Enzymes: No results for input(s): CKTOTAL, CKMB, CKMBINDEX, TROPONINI in the last 168 hours. BNP (last 3 results) No results for input(s): PROBNP in the last 8760 hours. HbA1C: Recent Labs    10/04/18 2134  HGBA1C 4.8   CBG: Recent Labs  Lab 10/06/18 1202 10/06/18 1702 10/06/18 2120 10/07/18 1024 10/07/18 1229  GLUCAP 83 117* 130* 100* 130*   Lipid Profile: No results for input(s): CHOL, HDL, LDLCALC, TRIG, CHOLHDL, LDLDIRECT in the last 72 hours. Thyroid Function Tests: No results for input(s): TSH,  T4TOTAL, FREET4, T3FREE, THYROIDAB in the last 72 hours. Anemia Panel: No results for input(s): VITAMINB12, FOLATE, FERRITIN, TIBC, IRON, RETICCTPCT in the last 72 hours. Urine analysis:    Component Value Date/Time   COLORURINE RED (A) 07/06/2016 1938   APPEARANCEUR TURBID (A) 07/06/2016 1938   LABSPEC 1.020 07/06/2016 1938   PHURINE 7.5 07/06/2016 1938   GLUCOSEU NEGATIVE 07/06/2016 1938   HGBUR LARGE (A) 07/06/2016 1938   BILIRUBINUR NEGATIVE 07/06/2016 1938   KETONESUR NEGATIVE 07/06/2016 1938   PROTEINUR >300 (A) 07/06/2016 1938   UROBILINOGEN 0.2 11/04/2014 0952   NITRITE NEGATIVE 07/06/2016 1938   LEUKOCYTESUR SMALL (A) 07/06/2016 1938   Sepsis Labs: @LABRCNTIP (procalcitonin:4,lacticidven:4)  ) Recent Results (from the past 240 hour(s))  Culture, blood (routine x 2)     Status: None (Preliminary result)   Collection Time: 10/04/18  8:09 PM  Result Value Ref Range Status   Specimen Description BLOOD LEFT ARM  Final   Special Requests   Final    BOTTLES DRAWN AEROBIC AND ANAEROBIC Blood  Culture adequate volume   Culture   Final    NO GROWTH 3 DAYS Performed at [facility], [place] [address] [place], [place] [address]    Report Status PENDING  Incomplete  Culture, blood (routine x 2)     Status: None (Preliminary result)   Collection Time: 10/04/18  8:50 PM  Result Value Ref Range Status   Specimen Description BLOOD LEFT FOREARM  Final   Special Requests   Final    BOTTLES DRAWN AEROBIC AND ANAEROBIC Blood Culture adequate volume   Culture   Final    NO GROWTH 3 DAYS Performed at [facility], [place] [address], [place], [place] [address]    Report Status PENDING  Incomplete  Culture, blood (Routine X 2) w Reflex to ID Panel     Status: None (Preliminary result)   Collection Time: 10/06/18  4:30 PM  Result Value Ref Range Status   Specimen Description BLOOD LEFT HAND  Final   Special Requests   Final    BOTTLES DRAWN AEROBIC ONLY Blood Culture results may not be optimal due to an inadequate volume of blood received in culture bottles   Culture   Final    NO GROWTH < 24 HOURS Performed at [facility], [place] [address], [place],  [address]    Report Status PENDING  Incomplete      Studies: Ct Biopsy  Result Date: October 09, 2018 INDICATION: 52 year old male with a history of granulomatosis with polyangiitis and a left apical pulmonary nodule. Differential considerations include granulomatous nodule, and possibly primary bronchogenic carcinoma. He presents for CT-guided biopsy of the same. EXAM: CT-guided biopsy left upper lobe pulmonary nodule Interventional Radiologist:  Criselda Peaches, MD MEDICATIONS: None. ANESTHESIA/SEDATION: Fentanyl 1 mcg IV; Versed 50 mg IV Moderate Sedation Time:  13 minutes The patient was continuously monitored during the procedure by the interventional radiology nurse under my direct supervision. FLUOROSCOPY TIME:  None. COMPLICATIONS: None immediate. Estimated blood loss:  0 PROCEDURE: Informed written consent was  obtained from the patient after a thorough discussion of the procedural risks, benefits and alternatives. All questions were addressed. Maximal Sterile Barrier Technique was utilized including caps, mask, sterile gowns, sterile gloves, sterile drape, hand hygiene and skin antiseptic. A timeout was performed prior to the initiation of the procedure. A planning axial CT scan was performed. The nodule in the left lung apex was successfully identified. A suitable skin entry site  was selected and marked. The region was then sterilely prepped and draped in standard fashion using Betadine skin prep. Local anesthesia was attained by infiltration with 1% lidocaine. A small dermatotomy was made. Under intermittent CT fluoroscopic guidance, a 17 gauge trocar needle was advanced into the lung and positioned at the margin of the nodule. Multiple 18 gauge core biopsies were then coaxially obtained using the BioPince automated biopsy device. Biopsy specimens were placed in formalin and delivered to pathology for further analysis. The biopsy device and introducer needle were removed and a bio sentry device was deployed. Post biopsy axial CT imaging demonstrates no evidence of immediate complication. There is no pneumothorax. Mild perilesional alveolar hemorrhage is not unexpected. The patient tolerated the procedure well. IMPRESSION: Technically successful CT-guided biopsy left apical pulmonary nodule. Electronically Signed   By: Jacqulynn Cadet M.D.   On: 10/07/2018 10:47   Dg Chest Port 1 View  Result Date: 10/07/2018 CLINICAL DATA:  Status post left lung biopsy. EXAM: PORTABLE CHEST 1 VIEW COMPARISON:  Procedural images from CT-guided biopsy earlier today. Chest radiographs and CT 10/04/2018. FINDINGS: A single lead ICD remains in place. The cardiac silhouette remains enlarged. Lung volumes are low with similar appearance of patchy bibasilar airspace opacities. Small nodular density is again seen in the left lung apex. No  pneumothorax or sizable pleural effusion is identified. IMPRESSION: 1. No pneumothorax. 2. Low lung volumes with persistent bibasilar atelectasis or pneumonia. Electronically Signed   By: Logan Bores M.D.   On: 10/07/2018 12:02    Scheduled Meds: . albuterol  2.5 mg Nebulization TID  . calcitRIOL  1 mcg Oral Q M,W,F-HD  . calcium acetate  1,334 mg Oral TID WC  . Chlorhexidine Gluconate Cloth  6 each Topical Q0600  . ciprofloxacin  1 drop Right Eye Q4H while awake  . feeding supplement (PRO-STAT SUGAR FREE 64)  30 mL Oral BID  . fentaNYL      . gabapentin  300 mg Oral BID  . heparin injection (subcutaneous)  5,000 Units Subcutaneous Q8H  . insulin aspart  0-5 Units Subcutaneous QHS  . insulin aspart  0-9 Units Subcutaneous TID WC  . lidocaine      . midazolam      . risperiDONE  0.25 mg Oral QHS  . traZODone  100 mg Oral QHS    Continuous Infusions: . ceFEPime (MAXIPIME) IV 1 g (10/06/18 1951)  . vancomycin 750 mg (10/05/18 1626)     LOS: 3 days     Desiree Hane, MD [facility] Pager [phone]  If 7PM-7AM, please contact night-coverage [email or web address] Password [facility] 10/07/2018, 5:33 PM

## 2018-10-08 ENCOUNTER — Inpatient Hospital Stay (HOSPITAL_COMMUNITY): Payer: Medicare Other

## 2018-10-08 DIAGNOSIS — R918 Other nonspecific abnormal finding of lung field: Secondary | ICD-10-CM

## 2018-10-08 DIAGNOSIS — H0589 Other disorders of orbit: Secondary | ICD-10-CM | POA: Clinically undetermined

## 2018-10-08 DIAGNOSIS — R05 Cough: Secondary | ICD-10-CM

## 2018-10-08 DIAGNOSIS — H5789 Other specified disorders of eye and adnexa: Secondary | ICD-10-CM | POA: Diagnosis present

## 2018-10-08 DIAGNOSIS — H0520 Unspecified exophthalmos: Secondary | ICD-10-CM

## 2018-10-08 DIAGNOSIS — J329 Chronic sinusitis, unspecified: Secondary | ICD-10-CM | POA: Diagnosis present

## 2018-10-08 DIAGNOSIS — R911 Solitary pulmonary nodule: Secondary | ICD-10-CM | POA: Diagnosis present

## 2018-10-08 DIAGNOSIS — J189 Pneumonia, unspecified organism: Principal | ICD-10-CM

## 2018-10-08 DIAGNOSIS — R059 Cough, unspecified: Secondary | ICD-10-CM

## 2018-10-08 LAB — BASIC METABOLIC PANEL
Anion gap: 16 — ABNORMAL HIGH (ref 5–15)
BUN: 29 mg/dL — ABNORMAL HIGH (ref 6–20)
CO2: 26 mmol/L (ref 22–32)
Calcium: 8.5 mg/dL — ABNORMAL LOW (ref 8.9–10.3)
Chloride: 96 mmol/L — ABNORMAL LOW (ref 98–111)
Creatinine, Ser: 6.54 mg/dL — ABNORMAL HIGH (ref 0.61–1.24)
GFR calc Af Amer: 10 mL/min — ABNORMAL LOW (ref >60)
GFR calc non Af Amer: 9 mL/min — ABNORMAL LOW (ref >60)
Glucose, Bld: 105 mg/dL — ABNORMAL HIGH (ref 70–99)
Potassium: 3.9 mmol/L (ref 3.5–5.1)
Sodium: 138 mmol/L (ref 135–145)

## 2018-10-08 LAB — GLUCOSE, CAPILLARY
Glucose-Capillary: 126 mg/dL — ABNORMAL HIGH (ref 70–99)
Glucose-Capillary: 87 mg/dL (ref 70–99)
Glucose-Capillary: 155 mg/dL — ABNORMAL HIGH (ref 70–99)
Glucose-Capillary: 148 mg/dL — ABNORMAL HIGH (ref 70–99)

## 2018-10-08 LAB — MAGNESIUM: Magnesium: 2.1 mg/dL (ref 1.7–2.4)

## 2018-10-08 MED ORDER — ARTIFICIAL TEARS OPHTHALMIC OINT
TOPICAL_OINTMENT | Freq: Four times a day (QID) | OPHTHALMIC | Status: DC
  Administered 2018-10-08 – 2018-10-16 (×26): via OPHTHALMIC
  Filled 2018-10-08 (×2): qty 3.5

## 2018-10-08 MED ORDER — ALBUTEROL SULFATE (2.5 MG/3ML) 0.083% IN NEBU: 2.5000 mg | INHALATION_SOLUTION | RESPIRATORY_TRACT | Status: DC | PRN

## 2018-10-08 MED ORDER — IPRATROPIUM-ALBUTEROL 0.5-2.5 (3) MG/3ML IN SOLN
3.0000 mL | Freq: Four times a day (QID) | RESPIRATORY_TRACT | Status: DC
  Administered 2018-10-08 – 2018-10-11 (×13): 3 mL via RESPIRATORY_TRACT
  Filled 2018-10-08 (×14): qty 3

## 2018-10-08 NOTE — Progress Notes (Addendum)
[facility] Progress Note   Subjective:  Seen in room, symptoms unchanged. Continues to wheeze and cough to point of vomiting. Pulm Bx nodule path pending. Still with R eye erythema, seems to be bulging this morning.  Objective Vitals:   10/08/18 0024 10/08/18 0030 10/08/18 0554 10/08/18 0841  BP: 118/85 115/70 120/83   Pulse:  (!) 111 (!) 103 (!) 108  Resp: 16 18 18 20   Temp:  98.8 F (37.1 C) 98.1 F (36.7 C)   TempSrc:  Oral Oral   SpO2:  96% 95% 94%  Weight: 76.9 kg     Height: 5\' 9"  (1.753 m)      Physical Exam General:Ill appearing man, NAD. R eye with red/edematous conjunctiva- bulging forward today, eyelid edema resolved. Heart:RRR; 2/6 systolic murmur Lungs:Diffuse wheezing/rhonchi throughout all lung fields Abdomen:Soft, non-tender Extremities:No LE edema Dialysis Access:AVF + thrill  Additional Objective Labs: Basic Metabolic Panel: Recent Labs  Lab 10/06/18 0154 10/07/18 0148 10/08/18 0142  NA 139 138 138  K 4.1 4.4 3.9  CL 97* 95* 96*  CO2 27 26 26   GLUCOSE 105* 124* 105*  BUN 42* 68* 29*  CREATININE 8.46* 11.73* 6.54*  CALCIUM 8.6* 9.1 8.5*   Liver Function Tests: Recent Labs  Lab 10/04/18 1808  AST 17  ALT 30  ALKPHOS 51  BILITOT 1.0  PROT 7.7  ALBUMIN 2.6*   CBC: Recent Labs  Lab 10/04/18 1808 10/04/18 2133 10/05/18 0204 10/06/18 0154 10/07/18 0148  WBC 18.5* 17.9* 16.4* 13.5* 10.9*  NEUTROABS 14.5*  --  12.3* 10.0*  --   HGB 9.2* 9.3* 8.5* 8.7* 8.5*  HCT 30.6* 29.8* 28.2* 28.0* 27.5*  MCV 99.7 98.3 98.6 97.6 97.2  PLT 332 313 313 283 253   CBG: Recent Labs  Lab 10/06/18 2120 10/07/18 1024 10/07/18 1229 10/07/18 1929 10/08/18 0800  GLUCAP 130* 100* 130* 118* 126*   Studies/Results: Ct Biopsy  Result Date: 10/07/2018 INDICATION: 52 year old male with a history of granulomatosis with polyangiitis and a left apical pulmonary nodule. Differential considerations include granulomatous nodule, and  possibly primary bronchogenic carcinoma. He presents for CT-guided biopsy of the same. EXAM: CT-guided biopsy left upper lobe pulmonary nodule Interventional Radiologist:  Criselda Peaches, MD MEDICATIONS: None. ANESTHESIA/SEDATION: Fentanyl 1 mcg IV; Versed 50 mg IV Moderate Sedation Time:  13 minutes The patient was continuously monitored during the procedure by the interventional radiology nurse under my direct supervision. FLUOROSCOPY TIME:  None. COMPLICATIONS: None immediate. Estimated blood loss:  0 PROCEDURE: Informed written consent was obtained from the patient after a thorough discussion of the procedural risks, benefits and alternatives. All questions were addressed. Maximal Sterile Barrier Technique was utilized including caps, mask, sterile gowns, sterile gloves, sterile drape, hand hygiene and skin antiseptic. A timeout was performed prior to the initiation of the procedure. A planning axial CT scan was performed. The nodule in the left lung apex was successfully identified. A suitable skin entry site was selected and marked. The region was then sterilely prepped and draped in standard fashion using Betadine skin prep. Local anesthesia was attained by infiltration with 1% lidocaine. A small dermatotomy was made. Under intermittent CT fluoroscopic guidance, a 17 gauge trocar needle was advanced into the lung and positioned at the margin of the nodule. Multiple 18 gauge core biopsies were then coaxially obtained using the BioPince automated biopsy device. Biopsy specimens were placed in formalin and delivered to pathology for further analysis. The biopsy device and introducer needle were removed and a  bio sentry device was deployed. Post biopsy axial CT imaging demonstrates no evidence of immediate complication. There is no pneumothorax. Mild perilesional alveolar hemorrhage is not unexpected. The patient tolerated the procedure well. IMPRESSION: Technically successful CT-guided biopsy left apical  pulmonary nodule. Electronically Signed   By: Jacqulynn Cadet M.D.   On: 10/07/2018 10:47   Dg Chest Port 1 View  Result Date: 10/07/2018 CLINICAL DATA:  Status post left lung biopsy. EXAM: PORTABLE CHEST 1 VIEW COMPARISON:  Procedural images from CT-guided biopsy earlier today. Chest radiographs and CT 10/04/2018. FINDINGS: A single lead ICD remains in place. The cardiac silhouette remains enlarged. Lung volumes are low with similar appearance of patchy bibasilar airspace opacities. Small nodular density is again seen in the left lung apex. No pneumothorax or sizable pleural effusion is identified. IMPRESSION: 1. No pneumothorax. 2. Low lung volumes with persistent bibasilar atelectasis or pneumonia. Electronically Signed   By: Logan Bores M.D.   On: 10/07/2018 12:02   Medications: . ceFEPime (MAXIPIME) IV 1 g (10/07/18 2015)  . vancomycin 750 mg (10/05/18 1626)   . calcitRIOL  1 mcg Oral Q M,W,F-HD  . calcium acetate  1,334 mg Oral TID WC  . Chlorhexidine Gluconate Cloth  6 each Topical Q0600  . feeding supplement (PRO-STAT SUGAR FREE 64)  30 mL Oral BID  . gabapentin  300 mg Oral BID  . heparin injection (subcutaneous)  5,000 Units Subcutaneous Q8H  . insulin aspart  0-5 Units Subcutaneous QHS  . insulin aspart  0-9 Units Subcutaneous TID WC  . ipratropium-albuterol  3 mL Nebulization QID  . risperiDONE  0.25 mg Oral QHS  . traZODone  100 mg Oral QHS    Dialysis Orders: MWFat [facility] 4h 400/800 77.5kg 2/2 bath R AVF Hep 7600 - Mircera75 every 2 wks (looks like ordered after last admit, but not given) -Calcitriol97mcg PO q HD  Assessment/Plan: 1.HCAP (recurrent/unresolved): On IV Vanc/Cefepime. Still diffusely wheezing, WBC coming down. Known Wegener's, wonder if this is flaring - no pulm hemorrhage on CT. Repeat ANCA panel ordered. Dense RLL consolidation per CT.  Still coughing and R eye is red and bulging a bit.  Will get head CT as Wegener's can involve the eye/ orbit.    2. ESRD:Continue HD per usual MWF schedule, next 12/13. 3.HTN/volume:BP ok, no edema on exam. Keeping same EDW. 4. Anemia:Hgb 8.5, due for ESA, but awaiting prelim lung nodule Bx at this point (would be contraindicated if + malignancy) 5. Secondary hyperparathyroidism:Ca/Phos ok. Continue home meds. 6. Nutrition:Alb very low, continue Pro-stat BID. 7. L Lung nodule: S/p needle Bx with IR12/11, concerning for malignancy per CT. 8. R eye erythema: Not improving depsite abx drops, R eye bulging today - will order CT of head/orbits to eval.  Veneta Penton, PA-C 10/08/2018, 10:38 AM  [facility] Pager: [phone]   Pt seen, examined, agree w assess/plan as above with additions as indicated.  Kelly Splinter MD [employer] pager [phone]    cell [phone] 10/08/2018, 12:15 PM

## 2018-10-08 NOTE — Consult Note (Addendum)
Reason for Consult: Wegener's, sinusitis, orbital mass Referring Physician: Desiree Hane, MD  James Nicholson is an 52 y.o. male.  HPI: History of Wegener's going back to at least 2014.  History of latent TB.  History of schizophrenia, chronic and acute renal insufficiency, significant heart disease.  Admitted with a 1-20-month history of erythema of the right conjunctiva, proptosis, but no known visual loss.  CT scan of the head identified chronic sinus disease on both sides and a right orbital mass with proptosis.  Lung lesion was biopsied yesterday.  Pathology is pending.  Awaiting results prior to initiating systemic steroid therapy or chemotherapy agents for Wegener's.  He has had problems with his left ear recently.  Is not clear how long that is been the case.  He has had some left-sided nosebleed recently.  Past Medical History:  Diagnosis Date  . Anemia   . Atrial fibrillation   . Cardiomyopathy ([place]) 2010   Unclear Etiology: Last Echo 06/2009: EF 40-45%, severe Lateral & apical Hypokinesis (? CAD) ; Grade 2 DDysfxn. Mild conc LVH.   Marland Kitchen Cellulitis   . Chronic kidney disease (CKD), stage V ([place])    Dialysis Tue, Thurs, Sat  . Dyslipidemia   . Enterobacter sepsis ([place])   . Hyperlipidemia   . Hypertension   . IDDM (insulin dependent diabetes mellitus) ([place]) 11/03/2014  . S/P ICD (internal cardiac defibrillator) procedure 2010   VT Arrest (in [place])  . Schizophrenia ([place])   . 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 AV FISTULA PLACEMENT Right 02/10/2013   Procedure: ARTERIOVENOUS (AV) FISTULA CREATION;  Surgeon: Rosetta Posner, MD;  Location: [facility] OR;  Service: Vascular;  Laterality: Right;  Right forearm radial/cephalic arterovenous fistula.   Marland Kitchen CARDIAC CATHETERIZATION  2010   [place]: In setting of VT arrest. Per brother's report, nonobstructive with no intervention  . CARDIAC DEFIBRILLATOR PLACEMENT  2010   [place]    . FISTULOGRAM Right 08/09/2013   Procedure: FISTULOGRAM;  Surgeon: Angelia Mould, MD;  Location[facility] C[facility] LAB;  Service: Cardiovascular;  Laterality: Right;  . ICD GENERATOR CHANGEOUT N/A 12/05/2017   Procedure: ICD GENERATOR CHANGEOUT;  Surgeon: Evans Lance, MD;  Location: [facility] CV LAB;  Service: Cardiovascular;  Laterality: N/A;  . LIGATION OF COMPETING BRANCHES OF ARTERIOVENOUS FISTULA Right 08/13/2013   Procedure: LIGATION OF COMPETING BRANCHES X5 OF ARTERIOVENOUS FISTULA- RIGHT ARM;  Surgeon: Serafina Mitchell, MD;  Location: [facility] OR;  Service: Vascular;  Laterality: Right;    Family History  Problem Relation Age of Onset  . CAD Mother     Social History:  reports that he quit smoking about 9 years ago. He has never used smokeless tobacco. He reports that he does not drink alcohol or use drugs.  Allergies: No Known Allergies  Medications: Reviewed  Results for orders placed or performed during the hospital encounter of 10/04/18 (from the past 48 hour(s))  Glucose, capillary     Status: Abnormal   Collection Time: 10/06/18  9:20 PM  Result Value Ref Range   Glucose-Capillary 130 (H) 70 - 99 mg/dL   Comment 1 Notify RN    Comment 2 Document in Chart   Magnesium     Status: None   Collection Time: 10/07/18  1:48 AM  Result Value Ref Range   Magnesium 2.4 1.7 - 2.4 mg/dL    Comment: Performed at [place],  [place] [address]  Basic metabolic panel     Status: Abnormal   Collection Time: 10/07/18  1:48 AM  Result Value Ref Range   Sodium 138 135 - 145 mmol/L   Potassium 4.4 3.5 - 5.1 mmol/L   Chloride 95 (L) 98 - 111 mmol/L   CO2 26 22 - 32 mmol/L   Glucose, Bld 124 (H) 70 - 99 mg/dL   BUN 68 (H) 6 - 20 mg/dL   Creatinine, Ser 11.73 (H) 0.61 - 1.24 mg/dL   Calcium 9.1 8.9 - 10.3 mg/dL   GFR calc non Af Amer 4 (L) >60 mL/min   GFR calc Af Amer 5 (L) >60 mL/min   Anion gap 17 (H) 5 - 15    Comment: Performed at [facility] [address] [place], [place] [address]  CBC     Status: Abnormal   Collection Time: 10/07/18  1:48 AM  Result Value Ref Range   WBC 10.9 (H) 4.0 - 10.5 K/uL   RBC 2.83 (L) 4.22 - 5.81 MIL/uL   Hemoglobin 8.5 (L) 13.0 - 17.0 g/dL   HCT 27.5 (L) 39.0 - 52.0 %   MCV 97.2 80.0 - 100.0 fL   MCH 30.0 26.0 - 34.0 pg   MCHC 30.9 30.0 - 36.0 g/dL   RDW 14.8 11.5 - 15.5 %   Platelets 253 150 - 400 K/uL   nRBC 0.0 0.0 - 0.2 %    Comment: Performed at [facility], [place] [address], [place], [place] [address]  Glucose, capillary     Status: Abnormal   Collection Time: 10/07/18 10:24 AM  Result Value Ref Range   Glucose-Capillary 100 (H) 70 - 99 mg/dL  Glucose, capillary     Status: Abnormal   Collection Time: 10/07/18 12:29 PM  Result Value Ref Range   Glucose-Capillary 130 (H) 70 - 99 mg/dL  Glucose, capillary     Status: Abnormal   Collection Time: 10/07/18  7:29 PM  Result Value Ref Range   Glucose-Capillary 118 (H) 70 - 99 mg/dL   Comment 1 Notify RN    Comment 2 Document in Chart   Magnesium     Status: None   Collection Time: 10/08/18  1:42 AM  Result Value Ref Range   Magnesium 2.1 1.7 - 2.4 mg/dL    Comment: Performed at [facility], [place] [address], [place], [place] [address]  Basic metabolic panel     Status: Abnormal   Collection Time: 10/08/18  1:42 AM  Result Value Ref Range   Sodium 138 135 - 145 mmol/L   Potassium 3.9 3.5 - 5.1 mmol/L   Chloride 96 (L) 98 - 111 mmol/L   CO2 26 22 - 32 mmol/L   Glucose, Bld 105 (H) 70 - 99 mg/dL   BUN 29 (H) 6 - 20 mg/dL   Creatinine, Ser 6.54 (H) 0.61 - 1.24 mg/dL    Comment: DELTA CHECK NOTED   Calcium 8.5 (L) 8.9 - 10.3 mg/dL   GFR calc non Af Amer 9 (L) >60 mL/min   GFR calc Af Amer 10 (L) >60 mL/min   Anion gap 16 (H) 5 - 15    Comment: Performed at [facility], [place] [address], [place], [place] [address]  Glucose, capillary     Status: Abnormal   Collection Time: 10/08/18  8:00 AM  Result Value  Ref Range   Glucose-Capillary 126 (H) 70 - 99 mg/dL  Glucose, capillary  Status: None   Collection Time: 10/08/18 12:49 PM  Result Value Ref Range   Glucose-Capillary 87 70 - 99 mg/dL  Glucose, capillary     Status: Abnormal   Collection Time: 10/08/18  5:32 PM  Result Value Ref Range   Glucose-Capillary 155 (H) 70 - 99 mg/dL    Ct Head Wo Contrast  Result Date: 10/08/2018 CLINICAL DATA:  Orbital asymmetry/proptosis. History of Wegner's granulomatosis. End-stage renal disease EXAM: CT HEAD WITHOUT CONTRAST TECHNIQUE: Contiguous axial images were obtained from the base of the skull through the vertex without intravenous contrast. COMPARISON:  None. FINDINGS: Brain: No evidence of acute infarction, hemorrhage, hydrocephalus, extra-axial collection or mass lesion/mass effect. Vascular: No hyperdense vessel or unexpected calcification. Skull: Normal. Negative for fracture or focal lesion. Sinuses/Orbits: There is sinusitis with lobulated patchy mucosal thickening in the right maxillary, bilateral anterior ethmoid, and left frontal sinus. There are areas of bone loss along the anterior and low right lamina papyracea and at the right uncinate process. These defects are more likely inflammatory than postsurgical in this setting. Polypoid mucosal thickening seen in the nasal cavity. There is a contiguous soft tissue mass that is fairly dense in the inferior and medial right orbit measuring up to 31 x 25 mm with mass effect on the globe and medial/inferior recti. This orbital mass is nonspecific by imaging (and could be pseudotumor or lymphoma), but in this clinical setting is most consistent with orbital granuloma from the patient's Wegner's. Complete opacification of the left mastoid and middle ear spaces with negative nasopharynx. IMPRESSION: 1. 3 cm right orbital mass with significant proptosis. 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 The mass is contiguous with active sinus disease via a defect in the medial lower orbit. 2. Left mastoid and middle ear opacification with negative nasopharynx. 3. Normal intracranial imaging. Electronically Signed   By: Monte Fantasia M.D.   On: 10/08/2018 10:35   Ct Biopsy  Result Date: 10/07/2018 INDICATION: 52 year old male with a history of granulomatosis with polyangiitis and a left apical pulmonary nodule. Differential considerations include granulomatous nodule, and possibly primary bronchogenic carcinoma. He presents for CT-guided biopsy of the same. EXAM: CT-guided biopsy left upper lobe pulmonary nodule Interventional Radiologist:  Criselda Peaches, MD MEDICATIONS: None. ANESTHESIA/SEDATION: Fentanyl 1 mcg IV; Versed 50 mg IV Moderate Sedation Time:  13 minutes The patient was continuously monitored during the procedure by the interventional radiology nurse under my direct supervision. FLUOROSCOPY TIME:  None. COMPLICATIONS: None immediate. Estimated blood loss:  0 PROCEDURE: Informed written consent was obtained from the patient after a thorough discussion of the procedural risks, benefits and alternatives. All questions were addressed. Maximal Sterile Barrier Technique was utilized including caps, mask, sterile gowns, sterile gloves, sterile drape, hand hygiene and skin antiseptic. A timeout was performed prior to the initiation of the procedure. A planning axial CT scan was performed. The nodule in the left lung apex was successfully identified. A suitable skin entry site was selected and marked. The region was then sterilely prepped and draped in standard fashion using Betadine skin prep. Local anesthesia was attained by infiltration with 1% lidocaine. A small dermatotomy was made. Under intermittent CT fluoroscopic guidance, a 17 gauge trocar needle was advanced into the lung and positioned at the margin of the nodule. Multiple 18 gauge core  biopsies were then coaxially obtained using the BioPince automated biopsy device. Biopsy specimens were placed in formalin and delivered  to pathology for further analysis. The biopsy device and introducer needle were removed and a bio sentry device was deployed. Post biopsy axial CT imaging demonstrates no evidence of immediate complication. There is no pneumothorax. Mild perilesional alveolar hemorrhage is not unexpected. The patient tolerated the procedure well. IMPRESSION: Technically successful CT-guided biopsy left apical pulmonary nodule. Electronically Signed   By: Jacqulynn Cadet M.D.   On: 10/07/2018 10:47   Dg Chest Port 1 View  Result Date: 10/07/2018 CLINICAL DATA:  Status post left lung biopsy. EXAM: PORTABLE CHEST 1 VIEW COMPARISON:  Procedural images from CT-guided biopsy earlier today. Chest radiographs and CT 10/04/2018. FINDINGS: A single lead ICD remains in place. The cardiac silhouette remains enlarged. Lung volumes are low with similar appearance of patchy bibasilar airspace opacities. Small nodular density is again seen in the left lung apex. No pneumothorax or sizable pleural effusion is identified. IMPRESSION: 1. No pneumothorax. 2. Low lung volumes with persistent bibasilar atelectasis or pneumonia. Electronically Signed   By: Logan Bores M.D.   On: 10/07/2018 12:02    [id number] except as listed in admit H&P  Blood pressure 120/83, pulse (!) 113, temperature 98.1 F (36.7 C), temperature source Oral, resp. rate 20, height 5\' 9"  (1.753 m), weight 76.9 kg, SpO2 94 %.  PHYSICAL EXAM: Overall appearance:  Healthy appearing, in no distress Head:  Normocephalic, atraumatic. Ears: External auditory canals are clear; tympanic membranes are intact and the middle ear looks clear on the right, with mucoid effusion on the left. Nose: External nose is healthy in appearance. Internal nasal exam reveals diffuse granulomatous appearing disease in the right side, but the airways are  patent bilaterally.  There is significant crusting and mild granulation on the left side.  There is no polyps or mass identified otherwise. Oral Cavity/Pharynx:  There are no mucosal lesions or masses identified. Larynx/Hypopharynx: Deferred Neuro: Extraocular muscle activity is grossly intact bilaterally.  Pupillary responses are appropriate and symmetric. Neck: No palpable neck masses.  Studies Reviewed: CT head Soft tissue mass within the right orbit with associated proptosis:   There is bilateral ethmoid disease, worse on the right:   Complete left frontal opacification, minimal frontal disease on the right:   Significant maxillary mucosal thickening on the right with a patent outflow tract:    Procedures: none   Assessment/Plan: Chronic Wegener's involving the lungs and presumably kidneys as well.  Definite involvement of the paranasal sinuses and likely the left mastoid/middle ear right now.  Right orbital mass probably of similar pathology.  The orbital mass does not appear to be related to any sinus obstruction or any infiltration of sinus disease beyond the lamina papyracea the best I can tell.  I would recommend a dedicated maxillofacial CT without contrast in order to further evaluate the anatomy of the paranasal sinuses and orbital mass.  As long as his vision is intact, recommend initiation of medical therapy.  I will continue to follow.  Izora Gala 10/08/2018, 6:26 PM

## 2018-10-08 NOTE — Progress Notes (Signed)
NAMETripton Nicholson, MRN:  [id number], DOB:  [birth year], LOS: 4 ADMISSION DATE:  10/04/2018, CONSULTATION DATE:  10/08/18 REFERRING MD:  Theodoro Grist MD, CHIEF COMPLAINT: Slow to resolve pneumonia.  Brief History   52 year old with history of Wegener's disease, renal failure.  Biopsy-proven ANCA positive necrotizing glomerulonephritis.  Recent admission from 12/1/5 to 10/01/2018 for pneumonia.  Readmitted on 12/12 for recurrence of symptoms. Underwent CT-guided biopsy of apical lung mass.  PCCM consulted 12/12 slow to resolve symptoms.  Past Medical History  History of atrial fibrillation, cardiomyopathy s/p AICD, ESRD, schizophrenia, latent TB status post INH therapy in 2014.  Biopsy-proven ANCA positive necrotizing glomerulonephritis treated with Cytoxan and steroids in 2010 in [place], admitted in 2014 for alveolar hemorrhage, renal failure requiring Cytoxan and steroids.  [name]  Significant Hospital Events     Consults:  12/2 Nephrology 12/10 IR  Procedures:  12/11- CT guided biopsy of apical mass  Significant Diagnostic Tests:  CT chest 10/04/2018- bilateral patchy airspace disease, 17 cm left apical nodule, aortic atherosclerosis. I have reviewed the images personally  Head CT 12/12-pending  Micro Data:  Blood cx 12/8- Blood cx 12/10-   Antimicrobials:  Vanco Cefepime  Interim history/subjective:    Objective   Blood pressure 120/83, pulse (!) 108, temperature 98.1 F (36.7 C), temperature source Oral, resp. rate 20, height 5\' 9"  (1.753 m), weight 76.9 kg, SpO2 94 %.    FiO2 (%):  [28 %] 28 %   Intake/Output Summary (Last 24 hours) at 10/08/2018 1152 Last data filed at 10/07/2018 1830 Gross per 24 hour  Intake -  Output 2674 ml  Net -2674 ml   Filed Weights   10/07/18 1415 10/07/18 1850 10/08/18 0024  Weight: 78.8 kg 76.9 kg 76.9 kg    Examination: Gen:      No acute distress HEENT: Right eye with mild  exophthalmos, conjunctival erythema.  Mild pain on eye movement. Neck:     No masses; no thyromegaly Lungs:    Scattered rhonchi CV:         Regular rate and rhythm; no murmurs Abd:      + bowel sounds; soft, non-tender; no palpable masses, no distension Ext:    No edema; adequate peripheral perfusion Skin:      Warm and dry; no rash Neuro: alert and oriented x 3 Psych: normal mood and affect  Resolved Hospital Problem list     Assessment & Plan:  52 year old with history of GPA, pulmonary renal syndrome in 2014 Admitted with recurrent pneumonia  I have reviewed the CT which shows bilateral opacities which is consistent with pneumonia.  He does have an apical nodule concerning for malignancy.  Differential diagnosis also includes flare of GPA.  He has history of latent TB and has completed INH therapy in 2014 so suspicion for reactivation of TB is low  Lung infiltrates, nodule Recommend continuing antibiotic therapy Follow biopsy results. If there is evidence of vasculitis then he may need to go back on immunosuppressives Check sputum cultures  Right eye redness. ? vasculitis affecting the eye Agree with ophthalmology consult. Follow head CT. Can check for sinus disease as well on scan  End stage renal disease Hemodialysis per nephrology  Labs   CBC: Recent Labs  Lab 10/04/18 1808 10/04/18 2133 10/05/18 0204 10/06/18 0154 10/07/18 0148  WBC 18.5* 17.9* 16.4* 13.5* 10.9*  NEUTROABS 14.5*  --  12.3* 10.0*  --   HGB 9.2* 9.3* 8.5*  8.7* 8.5*  HCT 30.6* 29.8* 28.2* 28.0* 27.5*  MCV 99.7 98.3 98.6 97.6 97.2  PLT 332 313 313 283 502    Basic Metabolic Panel: Recent Labs  Lab 10/04/18 1808 10/04/18 2133 10/05/18 0204 10/06/18 0154 10/07/18 0148 10/08/18 0142  NA 140  --  141 139 138 138  K 4.8  --  5.0 4.1 4.4 3.9  CL 97*  --  102 97* 95* 96*  CO2 23  --  21* 27 26 26   GLUCOSE 116*  --  94 105* 124* 105*  BUN 94*  --  106* 42* 68* 29*  CREATININE 15.11* 15.51*  15.98* 8.46* 11.73* 6.54*  CALCIUM 9.4  --  8.9 8.6* 9.1 8.5*  MG  --   --  2.4 2.2 2.4 2.1   GFR: Estimated Creatinine Clearance: 13.4 mL/min (A) (by C-G formula based on SCr of 6.54 mg/dL (H)). Recent Labs  Lab 10/04/18 1816 10/04/18 2133 10/05/18 0204 10/06/18 0154 10/07/18 0148  WBC  --  17.9* 16.4* 13.5* 10.9*  LATICACIDVEN 0.97  --   --   --   --     Liver Function Tests: Recent Labs  Lab 10/04/18 1808  AST 17  ALT 30  ALKPHOS 51  BILITOT 1.0  PROT 7.7  ALBUMIN 2.6*   No results for input(s): LIPASE, AMYLASE in the last 168 hours. No results for input(s): AMMONIA in the last 168 hours.  ABG    Component Value Date/Time   PHART 7.289 (L) 02/05/2013 0146   PCO2ART 29.0 (L) 02/05/2013 0146   PO2ART 81.0 02/05/2013 0146   HCO3 13.9 (L) 02/05/2013 0146   TCO2 29 07/07/2016 0843   ACIDBASEDEF 12.0 (H) 02/05/2013 0146   O2SAT 95.0 02/05/2013 0146     Coagulation Profile: Recent Labs  Lab 10/06/18 0154  INR 1.33    Cardiac Enzymes: No results for input(s): CKTOTAL, CKMB, CKMBINDEX, TROPONINI in the last 168 hours.  HbA1C: Hgb A1c MFr Bld  Date/Time Value Ref Range Status  10/04/2018 09:34 PM 4.8 4.8 - 5.6 % Final    Comment:    (NOTE) Pre diabetes:          5.7%-6.4% Diabetes:              >6.4% Glycemic control for   <7.0% adults with diabetes   12/21/2013 08:10 PM 4.5 <5.7 % Final    Comment:    (NOTE)                                                                       According to the ADA Clinical Practice Recommendations for 2011, when HbA1c is used as a screening test:  >=6.5%   Diagnostic of Diabetes Mellitus           (if abnormal result is confirmed) 5.7-6.4%   Increased risk of developing Diabetes Mellitus References:Diagnosis and Classification of Diabetes Mellitus,Diabetes DXAJ,2878,67(EHMCN 1):S62-S69 and Standards of Medical Care in         Diabetes - 2011,Diabetes OBSJ,6283,66 (Suppl 1):S11-S61.    CBG: Recent Labs  Lab  10/06/18 2120 10/07/18 1024 10/07/18 1229 10/07/18 1929 10/08/18 0800  GLUCAP 130* 100* 130* 118* 126*    Review of Systems:   All negative; except  for those that are bolded, which indicate positives.  Constitutional: weight loss, weight gain, night sweats, fevers, chills, fatigue, weakness.  HEENT: headaches, sore throat, sneezing, nasal congestion, post nasal drip, difficulty swallowing, tooth/dental problems, visual complaints, visual changes, ear aches. Neuro: difficulty with speech, weakness, numbness, ataxia. CV:  chest pain, orthopnea, PND, swelling in lower extremities, dizziness, palpitations, syncope.  Resp: cough, hemoptysis, dyspnea, wheezing. GI: heartburn, indigestion, abdominal pain, nausea, vomiting, diarrhea, constipation, change in bowel habits, loss of appetite, hematemesis, melena, hematochezia.  GU: dysuria, change in color of urine, urgency or frequency, flank pain, hematuria. MSK: joint pain or swelling, decreased range of motion. Psych: change in mood or affect, depression, anxiety, suicidal ideations, homicidal ideations. Skin: rash, itching, bruising.  Past Medical History  He,  has a past medical history of Anemia, Atrial fibrillation, Cardiomyopathy ([place]) (2010), Cellulitis, Chronic kidney disease (CKD), stage V ([place]), Dyslipidemia, Enterobacter sepsis ([place]), Hyperlipidemia, Hypertension, IDDM (insulin dependent diabetes mellitus) ([place]) (11/03/2014), S/P ICD (internal cardiac defibrillator) procedure (2010), Schizophrenia ([place]), and Wegener's disease, pulmonary ([place]) (02/05/2013).   Surgical History    Past Surgical History:  Procedure Laterality Date  . AV FISTULA PLACEMENT Right 02/10/2013   Procedure: ARTERIOVENOUS (AV) FISTULA CREATION;  Surgeon: Rosetta Posner, MD;  Location: [facility] OR;  Service: Vascular;  Laterality: Right;  Right forearm radial/cephalic arterovenous fistula.   Marland Kitchen CARDIAC CATHETERIZATION  2010   [place]: In setting of VT arrest. Per brother's  report, nonobstructive with no intervention  . CARDIAC DEFIBRILLATOR PLACEMENT  2010   [place]  . FISTULOGRAM Right 08/09/2013   Procedure: FISTULOGRAM;  Surgeon: Angelia Mould, MD;  Location: [facility] CATH LAB;  Service: Cardiovascular;  Laterality: Right;  . ICD GENERATOR CHANGEOUT N/A 12/05/2017   Procedure: ICD GENERATOR CHANGEOUT;  Surgeon: Evans Lance, MD;  Location: [facility] CV LAB;  Service: Cardiovascular;  Laterality: N/A;  . LIGATION OF COMPETING BRANCHES OF ARTERIOVENOUS FISTULA Right 08/13/2013   Procedure: LIGATION OF COMPETING BRANCHES X5 OF ARTERIOVENOUS FISTULA- RIGHT ARM;  Surgeon: Serafina Mitchell, MD;  Location: [facility] OR;  Service: Vascular;  Laterality: Right;     Social History   reports that he quit smoking about 9 years ago. He has never used smokeless tobacco. He reports that he does not drink alcohol or use drugs.   Family History   His family history includes CAD in his mother.   Allergies No Known Allergies   Home Medications  Prior to Admission medications   Medication Sig Start Date End Date Taking? Authorizing Provider  calcium acetate (PHOSLO) 667 MG capsule Take 1,334 mg by mouth 3 (three) times daily with meals. 8a, 12p, 5p 02/11/13  Yes Regalado, Belkys A, MD  gabapentin (NEURONTIN) 300 MG capsule Take 300 mg by mouth 2 (two) times daily.   Yes [provider]  gemfibrozil (LOPID) 600 MG tablet Take 600 mg by mouth 2 (two) times daily before a meal.   Yes [provider]  insulin detemir (LEVEMIR) 100 UNIT/ML injection Inject 0.05 mLs (5 Units total) into the skin at bedtime. 02/11/13  Yes Regalado, Belkys A, MD  metoprolol succinate (TOPROL-XL) 50 MG 24 hr tablet Take 50 mg by mouth daily. Take with or immediately following a meal.    Yes [provider]  omeprazole (PRILOSEC) 20 MG capsule Take 20 mg by mouth every morning.    Yes [provider]  risperiDONE (RISPERDAL) 0.25 MG tablet Take 0.25 mg by mouth at  bedtime.   Yes [provider]  traZODone (DESYREL) 100 MG tablet Take 100 mg by mouth at bedtime.   Yes [provider]     Marshell Garfinkel MD [facility] Pager [phone] If no answer call: [phone] 10/08/2018, 12:58 PM

## 2018-10-08 NOTE — Progress Notes (Addendum)
PROGRESS NOTE  James Nicholson TGG:[id number] DOB: Jul 24, 1966 DOA: 10/04/2018 PCP: [name], MD  HPI/Brief Narrative  James Nicholson is a 52 y.o. year old male of [place] descent with medical history significant for ESRD on HD, ANCA granulomatosis polyangiitis (diagnosed 2010), schizophrenia, history of VT arrest status post ICD (2010), history of latent TB (status post 9 months of isoniazid 04/2012) who presented on 10/04/2018 with worsening cough, dyspnea and blood-tinged sputum after recent hospitalization and discharge (12/1-12/02/2018) for presumed H CAP for which he empirically received IV ceftriaxone and azithromycin and was discharged on cefdinir and azithromycin to complete 7-day course.  Subjective Still having significant cough, not clearly productive Reports eye redness ongoing for 1 to 2 months (per brother).  Denies any changes in vision currently.   Assessment/Plan:  #Presumed HCAP, slightly worsened.  Still tachypneic with no overt accessory muscle use, and now requiring oxygen (2 L). Currently treating for presumed infectious etiology given bibasilar airspace disease on CT chest and repeat chest x-ray. Pulmonary recommends awaiting biopsy results of lung nodule to determine if any evidence of vasculitis and need for potential immunosuppressive therapy..  Patient failed outpatient treatment so currently on empiric IV vancomycin, cefepime.  Supportive care with albuterol nebs, flutter valve, incentive spirometry.   Monitor blood cultures.  Currently not requiring oxygen  #Right eye redness x 71month secondary to right orbital mass causing significant proptosis.  CT head obtained on 12/12 concerning for orbital granuloma (contiguous with active sinus disease via defect medial lower orbit).  Discussed with Dr. [name] with [facility] who recommends discontinuing clear red eyes/cipro drops(d/c'd 12/11) and starting Lacri-Lube or erythromycin ointment 3-4 times a  day as well as artificial tears.  Once patient's Wegener's disease is treated recommends outpatient follow-up appointment with Dr. Manuella Ghazi [facility]). On exam has some pain but vision still intact on my finger exam  #Pansinusitis.  CT head shows lobulated areas of patchy mucosal thickening in right maxillary, bilateral anterior ethmoid and left frontal sinus.  Discussed with ENT who recommends CT maxillary facial imaging for further evaluation and will see patient  #Sepsis syndrome, present on admission, resolved. Presented with tachypnea, elevated white count and concern bilateral airspace disease initial imaging concerning for pneumonia with sepsis syndrome.  White count is slowly improved, patient still having elevated respiratory rate but currently treating for presumed pneumonia  #Left apical pulmonary nodule.  Concern for possible primary bronchogenic neoplasm or could represent Wegeners?  Pulmonary recommends awaiting biopsy results to determine if any evidence of vasculitis and need for potential immunosuppressive therapy.  Status post CT-guided lung biopsy on 12/11 by IR,  #ESRD on HD, Monday Wednesday Friday.  Appreciate nephrology following and managing dialysis sessions.  Continue PhosLo 3 times daily  #Type 2 diabetes.  A1c 4.8%.  Holding home Levemir, monitor CBGs on sliding scale insulin as needed low A1c do not suspect he will need to continue Levemir on discharge  #Schizophrenia, stable.  Continue home Risperdal and trazodone  #History of granulomatosis polyangiitis with renal disease.  Diagnosed in 2010 (previously treated with Cytoxan and steroids, last hospitalization for flare in 2014 with symptoms of epistasis, renal failure and alveolar hemorrhage renal CT scan) unclear if this is a current flare, currently treating empirically for infection, await results of biopsy.  #Peripheral neuropathy in lower extremities.  Continue home gabapentin  #Normocytic anemia,  anemia of chronic disease (ESRD).  Hemoglobin stable at baseline.  Code Status: Full code  Family Communication: No family at bedside  Disposition Plan: Continues to require IV antibiotics for presumed H CAP, awaiting biopsy results, further CT sinus imaging per ENT, appreciate pulmonary recommendations,    Consultants:  Treatment Team:  Rush Farmer, MD  Roney Jaffe, MD  ENT, ophthalmology, pulmonary  Procedures:  Ct guided left upper lobe pulm nodule biopsy 12/11  Prophylaxis: Heparin subcutaneous  Antimicrobials: Anti-infectives (From admission, onward)   Start     Dose/Rate Route Frequency Ordered Stop   10/05/18 1200  vancomycin (VANCOCIN) IVPB 750 mg/150 ml premix     750 mg 150 mL/hr over 60 Minutes Intravenous Every M-W-F (Hemodialysis) 10/04/18 2049     10/04/18 2100  ceFEPIme (MAXIPIME) 1 g in sodium chloride 0.9 % 100 mL IVPB     1 g 200 mL/hr over 30 Minutes Intravenous Every 24 hours 10/04/18 2039     10/04/18 2100  vancomycin (VANCOCIN) 1,750 mg in sodium chloride 0.9 % 500 mL IVPB     1,750 mg 250 mL/hr over 120 Minutes Intravenous  Once 10/04/18 2047 10/05/18 0137      Cultures:  12/10 x 1 blood culture, no growth, 12/8 x 2 blood cultures no growth to date  Telemetry: None  DVT prophylaxis: Heparin prophylaxis   Objective: Vitals:   10/08/18 0030 10/08/18 0554 10/08/18 0841 10/08/18 1250  BP: 115/70 120/83    Pulse: (!) 111 (!) 103 (!) 108 (!) 110  Resp: 18 18 20 18   Temp: 98.8 F (37.1 C) 98.1 F (36.7 C)    TempSrc: Oral Oral    SpO2: 96% 95% 94% 94%  Weight:      Height:        Intake/Output Summary (Last 24 hours) at 10/08/2018 1303 Last data filed at 10/07/2018 1830 Gross per 24 hour  Intake -  Output 2674 ml  Net -2674 ml   Filed Weights   10/07/18 1415 10/07/18 1850 10/08/18 0024  Weight: 78.8 kg 76.9 kg 76.9 kg    Exam:  Constitutional: Normal appearing male, lying in bed, no distress Eyes:  diffuse redness  of entire right sclera , ocular movement still intact, able to pass finger exam ENMT: No sinus tenderness, Oropharynx with moist mucous membranes, normal dentition Cardiovascular:  no peripheral edema Respiratory: Diffuse wheezing, tachypneic, no accessory muscle use, on 2 L [place] Abdomen: Soft,non-tender, normal bowel sounds Skin: No rash ulcers, or lesions. Without skin tenting  Neurologic: Grossly no focal neuro deficit. Psychiatric:Appropriate affect, and mood.   Data Reviewed: CBC: Recent Labs  Lab 10/04/18 1808 10/04/18 2133 10/05/18 0204 10/06/18 0154 10/07/18 0148  WBC 18.5* 17.9* 16.4* 13.5* 10.9*  NEUTROABS 14.5*  --  12.3* 10.0*  --   HGB 9.2* 9.3* 8.5* 8.7* 8.5*  HCT 30.6* 29.8* 28.2* 28.0* 27.5*  MCV 99.7 98.3 98.6 97.6 97.2  PLT 332 313 313 283 841   Basic Metabolic Panel: Recent Labs  Lab 10/04/18 1808 10/04/18 2133 10/05/18 0204 10/06/18 0154 10/07/18 0148 10/08/18 0142  NA 140  --  141 139 138 138  K 4.8  --  5.0 4.1 4.4 3.9  CL 97*  --  102 97* 95* 96*  CO2 23  --  21* 27 26 26   GLUCOSE 116*  --  94 105* 124* 105*  BUN 94*  --  106* 42* 68* 29*  CREATININE 15.11* 15.51* 15.98* 8.46* 11.73* 6.54*  CALCIUM 9.4  --  8.9 8.6* 9.1 8.5*  MG  --   --  2.4 2.2 2.4 2.1   GFR: Estimated  Creatinine Clearance: 13.4 mL/min (A) (by C-G formula based on SCr of 6.54 mg/dL (H)). Liver Function Tests: Recent Labs  Lab 10/04/18 1808  AST 17  ALT 30  ALKPHOS 51  BILITOT 1.0  PROT 7.7  ALBUMIN 2.6*   No results for input(s): LIPASE, AMYLASE in the last 168 hours. No results for input(s): AMMONIA in the last 168 hours. Coagulation Profile: Recent Labs  Lab 10/06/18 0154  INR 1.33   Cardiac Enzymes: No results for input(s): CKTOTAL, CKMB, CKMBINDEX, TROPONINI in the last 168 hours. BNP (last 3 results) No results for input(s): PROBNP in the last 8760 hours. HbA1C: No results for input(s): HGBA1C in the last 72 hours. CBG: Recent Labs  Lab  10/07/18 1024 10/07/18 1229 10/07/18 1929 10/08/18 0800 10/08/18 1249  GLUCAP 100* 130* 118* 126* 87   Lipid Profile: No results for input(s): CHOL, HDL, LDLCALC, TRIG, CHOLHDL, LDLDIRECT in the last 72 hours. Thyroid Function Tests: No results for input(s): TSH, T4TOTAL, FREET4, T3FREE, THYROIDAB in the last 72 hours. Anemia Panel: No results for input(s): VITAMINB12, FOLATE, FERRITIN, TIBC, IRON, RETICCTPCT in the last 72 hours. Urine analysis:    Component Value Date/Time   COLORURINE RED (A) 07/06/2016 1938   APPEARANCEUR TURBID (A) 07/06/2016 1938   LABSPEC 1.020 07/06/2016 1938   PHURINE 7.5 07/06/2016 1938   GLUCOSEU NEGATIVE 07/06/2016 1938   HGBUR LARGE (A) 07/06/2016 1938   BILIRUBINUR NEGATIVE 07/06/2016 1938   KETONESUR NEGATIVE 07/06/2016 1938   PROTEINUR >300 (A) 07/06/2016 1938   UROBILINOGEN 0.2 11/04/2014 0952   NITRITE NEGATIVE 07/06/2016 1938   LEUKOCYTESUR SMALL (A) 07/06/2016 1938   Sepsis Labs: @LABRCNTIP (procalcitonin:4,lacticidven:4)  ) Recent Results (from the past 240 hour(s))  Culture, blood (routine x 2)     Status: None (Preliminary result)   Collection Time: 10/04/18  8:09 PM  Result Value Ref Range Status   Specimen Description BLOOD LEFT ARM  Final   Special Requests   Final    BOTTLES DRAWN AEROBIC AND ANAEROBIC Blood Culture adequate volume   Culture   Final    NO GROWTH 3 DAYS Performed at [facility], [place] [address] [place], [place] [address]    Report Status PENDING  Incomplete  Culture, blood (routine x 2)     Status: None (Preliminary result)   Collection Time: 10/04/18  8:50 PM  Result Value Ref Range Status   Specimen Description BLOOD LEFT FOREARM  Final   Special Requests   Final    BOTTLES DRAWN AEROBIC AND ANAEROBIC Blood Culture adequate volume   Culture   Final    NO GROWTH 3 DAYS Performed at [facility], [place] [address], [place], [place] [address]    Report Status PENDING  Incomplete  Culture,  blood (Routine X 2) w Reflex to ID Panel     Status: None (Preliminary result)   Collection Time: 10/06/18  4:30 PM  Result Value Ref Range Status   Specimen Description BLOOD LEFT HAND  Final   Special Requests   Final    BOTTLES DRAWN AEROBIC ONLY Blood Culture results may not be optimal due to an inadequate volume of blood received in culture bottles   Culture   Final    NO GROWTH < 24 HOURS Performed at [facility], [place] [address], [place], [place] [address]    Report Status PENDING  Incomplete      Studies: No results found.  Scheduled Meds: . calcitRIOL  1 mcg  Oral Q M,W,F-HD  . calcium acetate  1,334 mg Oral TID WC  . Chlorhexidine Gluconate Cloth  6 each Topical Q0600  . feeding supplement (PRO-STAT SUGAR FREE 64)  30 mL Oral BID  . gabapentin  300 mg Oral BID  . heparin injection (subcutaneous)  5,000 Units Subcutaneous Q8H  . insulin aspart  0-5 Units Subcutaneous QHS  . insulin aspart  0-9 Units Subcutaneous TID WC  . ipratropium-albuterol  3 mL Nebulization QID  . risperiDONE  0.25 mg Oral QHS  . traZODone  100 mg Oral QHS    Continuous Infusions: . ceFEPime (MAXIPIME) IV 1 g (10/07/18 2015)  . vancomycin 750 mg (10/05/18 1626)     LOS: 4 days     Desiree Hane, MD [facility] Pager [phone]  If 7PM-7AM, please contact night-coverage [email or web address] Password [email or web address] 10/08/2018, 1:03 PM

## 2018-10-08 NOTE — Care Management Important Message (Signed)
Important Message  Patient Details  Name: Uri Turnbough MRN: [id number] Date of Birth: April 30, 1966   Medicare Important Message Given:  Yes    Rosali Augello 10/08/2018, 11:30 AM

## 2018-10-09 DIAGNOSIS — M3131 Wegener's granulomatosis with renal involvement: Secondary | ICD-10-CM

## 2018-10-09 DIAGNOSIS — H5789 Other specified disorders of eye and adnexa: Secondary | ICD-10-CM

## 2018-10-09 DIAGNOSIS — J9601 Acute respiratory failure with hypoxia: Secondary | ICD-10-CM

## 2018-10-09 DIAGNOSIS — H0589 Other disorders of orbit: Secondary | ICD-10-CM

## 2018-10-09 DIAGNOSIS — J019 Acute sinusitis, unspecified: Secondary | ICD-10-CM

## 2018-10-09 LAB — CULTURE, BLOOD (ROUTINE X 2)
Culture: NO GROWTH
Culture: NO GROWTH
Special Requests: ADEQUATE
Special Requests: ADEQUATE

## 2018-10-09 LAB — BASIC METABOLIC PANEL
Anion gap: 20 — ABNORMAL HIGH (ref 5–15)
BUN: 68 mg/dL — ABNORMAL HIGH (ref 6–20)
CO2: 22 mmol/L (ref 22–32)
Calcium: 9 mg/dL (ref 8.9–10.3)
Chloride: 96 mmol/L — ABNORMAL LOW (ref 98–111)
Creatinine, Ser: 10.15 mg/dL — ABNORMAL HIGH (ref 0.61–1.24)
GFR calc Af Amer: 6 mL/min — ABNORMAL LOW (ref >60)
GFR calc non Af Amer: 5 mL/min — ABNORMAL LOW (ref >60)
Glucose, Bld: 116 mg/dL — ABNORMAL HIGH (ref 70–99)
Potassium: 4.5 mmol/L (ref 3.5–5.1)
Sodium: 138 mmol/L (ref 135–145)

## 2018-10-09 LAB — CBC
HCT: 27.8 % — ABNORMAL LOW (ref 39.0–52.0)
Hemoglobin: 8.7 g/dL — ABNORMAL LOW (ref 13.0–17.0)
MCH: 31 pg (ref 26.0–34.0)
MCHC: 31.3 g/dL (ref 30.0–36.0)
MCV: 98.9 fL (ref 80.0–100.0)
Platelets: 242 10*3/uL (ref 150–400)
RBC: 2.81 MIL/uL — ABNORMAL LOW (ref 4.22–5.81)
RDW: 14.9 % (ref 11.5–15.5)
WBC: 11.8 10*3/uL — ABNORMAL HIGH (ref 4.0–10.5)
nRBC: 0 % (ref 0.0–0.2)

## 2018-10-09 LAB — EXPECTORATED SPUTUM ASSESSMENT W GRAM STAIN, RFLX TO RESP C: Special Requests: NORMAL

## 2018-10-09 LAB — MPO/PR-3 (ANCA) ANTIBODIES
ANCA Proteinase 3: 71.2 U/mL — ABNORMAL HIGH (ref 0.0–3.5)
Myeloperoxidase Abs: <9 U/mL (ref 0.0–9.0)

## 2018-10-09 LAB — MAGNESIUM: Magnesium: 2.5 mg/dL — ABNORMAL HIGH (ref 1.7–2.4)

## 2018-10-09 LAB — GLUCOSE, CAPILLARY
Glucose-Capillary: 104 mg/dL — ABNORMAL HIGH (ref 70–99)
Glucose-Capillary: 98 mg/dL (ref 70–99)
Glucose-Capillary: 191 mg/dL — ABNORMAL HIGH (ref 70–99)

## 2018-10-09 LAB — MRSA PCR SCREENING: MRSA by PCR: NEGATIVE

## 2018-10-09 MED ORDER — HEPARIN SODIUM (PORCINE) 1000 UNIT/ML DIALYSIS
20.0000 [IU]/kg | INTRAMUSCULAR | Status: DC | PRN
  Administered 2018-10-09: 1500 [IU] via INTRAVENOUS_CENTRAL
  Filled 2018-10-09: qty 1.5

## 2018-10-09 MED ORDER — HEPARIN SODIUM (PORCINE) 1000 UNIT/ML IJ SOLN
INTRAMUSCULAR | Status: AC
  Administered 2018-10-09: 1500 [IU] via INTRAVENOUS_CENTRAL
  Filled 2018-10-09: qty 2

## 2018-10-09 MED ORDER — VANCOMYCIN HCL IN DEXTROSE 750-5 MG/150ML-% IV SOLN
INTRAVENOUS | Status: AC
  Administered 2018-10-09: 750 mg via INTRAVENOUS
  Filled 2018-10-09: qty 150

## 2018-10-09 MED ORDER — CALCITRIOL 0.5 MCG PO CAPS
ORAL_CAPSULE | ORAL | Status: AC
  Administered 2018-10-09: 1 ug via ORAL
  Filled 2018-10-09: qty 2

## 2018-10-09 MED ORDER — METHYLPREDNISOLONE SODIUM SUCC 125 MG IJ SOLR
125.0000 mg | Freq: Four times a day (QID) | INTRAMUSCULAR | Status: DC
  Administered 2018-10-09 – 2018-10-11 (×8): 125 mg via INTRAVENOUS
  Filled 2018-10-09 (×8): qty 2

## 2018-10-09 NOTE — Progress Notes (Signed)
Patient ID: James Nicholson, male   DOB: Apr 13, 1966, 52 y.o.   MRN: [id number]  Maxillofacial CT reviewed. No changes in recommendations based on CT findings.

## 2018-10-09 NOTE — Plan of Care (Signed)
  Problem: Activity: Goal: Risk for activity intolerance will decrease Outcome: Progressing   Problem: Nutrition: Goal: Adequate nutrition will be maintained Outcome: Progressing   Problem: Coping: Goal: Level of anxiety will decrease Outcome: Progressing   

## 2018-10-09 NOTE — Progress Notes (Deleted)
Subjective:  Seen on HD , "cough better"  Objective Vital signs in last 24 hours: Vitals:   10/09/18 1030 10/09/18 1045 10/09/18 1106 10/09/18 1152  BP: 93/65 (!) 79/52 94/61 119/74  Pulse: 100 97 89 (!) 101  Resp:   17 (!) 22  Temp:   (!) 97.5 F (36.4 C) 98.4 F (36.9 C)  TempSrc:   Oral Oral  SpO2:   94% 99%  Weight:   75.9 kg   Height:       Weight change: -1.2 kg  Physical Exam: General:chronically Ill appearing Male , NAD on HD with  Right eye with red/edematousconjunctiva- bulging forward  eyelid edema has resolved. Heart:RRR; 2/6 systolic murmur, no rub or gallop  Lungs:Now decreasing /rhonchi throughout all lung fields Abdomen:Soft,BS +, Nondistended  non-tender Extremities:No LE edema Dialysis Access:AVF  patent  HD    Dialysis Orders: MWFat [facility] 4h 400/800 77.5kg 2/2 bath R AVF Hep 7600 - Mircera75 every 2 wks (looks like ordered after last admit, but not given) -Calcitriol88mcg PO q HD  Problem/Plan: 1.HCAP (recurrent/unresolved): On IV Vanc/Cefepime. Still diffusely wheezing, WBC coming down.Has  Known Wegener's, and Consulting Pulmon. = with CT Lung Dense RLL Consolidation ,Biopsy done  Pending now  = May need Immunosuppressives  as noted per Pulm.  Repeat ANCA panel ordered.    2. ESRD:Continue HD per usual MWF schedule 3.HTN/volume:BP ok, no edema on exam. Keeping same EDW. 4. Anemia:Hgb 8.5> 8.7 ,due for ESA, but awaiting prelim lung nodule Bx at this point (would be contraindicated if + malignancy) 5. Secondary hyperparathyroidism:Ca/Phos ok. Continue home meds. 6. Nutrition:Alb very low,continuePro-stat BID. 7. L Lung nodule:S/pneedle Bx with IR12/11, concerning for malignancy per CT. 8. R eye erythema: with Bulging Not improving depsite abx drops, R eye bulging  =Otolaryngology/Ophthalmology  consulted with CT  Head noted =Wegener's involving Paranasal sinuses , and likely the left mastoid/middle ear right,with Right orbital  mass thought  probably of similar pathology.    Ernest Haber, PA-C [facility] [phone] 10/09/2018,12:57 PM  LOS: 5 days   Labs: Basic Metabolic Panel: Recent Labs  Lab 10/07/18 0148 10/08/18 0142 10/09/18 0308  NA 138 138 138  K 4.4 3.9 4.5  CL 95* 96* 96*  CO2 26 26 22   GLUCOSE 124* 105* 116*  BUN 68* 29* 68*  CREATININE 11.73* 6.54* 10.15*  CALCIUM 9.1 8.5* 9.0   Liver Function Tests: Recent Labs  Lab 10/04/18 1808  AST 17  ALT 30  ALKPHOS 51  BILITOT 1.0  PROT 7.7  ALBUMIN 2.6*   No results for input(s): LIPASE, AMYLASE in the last 168 hours. No results for input(s): AMMONIA in the last 168 hours. CBC: Recent Labs  Lab 10/04/18 1808 10/04/18 2133 10/05/18 0204 10/06/18 0154 10/07/18 0148 10/09/18 0709  WBC 18.5* 17.9* 16.4* 13.5* 10.9* 11.8*  NEUTROABS 14.5*  --  12.3* 10.0*  --   --   HGB 9.2* 9.3* 8.5* 8.7* 8.5* 8.7*  HCT 30.6* 29.8* 28.2* 28.0* 27.5* 27.8*  MCV 99.7 98.3 98.6 97.6 97.2 98.9  PLT 332 313 313 283 253 242   Cardiac Enzymes: No results for input(s): CKTOTAL, CKMB, CKMBINDEX, TROPONINI in the last 168 hours. CBG: Recent Labs  Lab 10/08/18 0800 10/08/18 1249 10/08/18 1732 10/08/18 2144 10/09/18 1153  GLUCAP 126* 87 155* 148* 104*    Studies/Results: Ct Head Wo Contrast  Result Date: 10/08/2018 CLINICAL DATA:  Orbital asymmetry/proptosis. History of Wegner's granulomatosis. End-stage renal disease EXAM: CT HEAD WITHOUT CONTRAST  TECHNIQUE: Contiguous axial images were obtained from the base of the skull through the vertex without intravenous contrast. COMPARISON:  None. FINDINGS: Brain: No evidence of acute infarction, hemorrhage, hydrocephalus, extra-axial collection or mass lesion/mass effect. Vascular: No hyperdense vessel or unexpected calcification. Skull: Normal. Negative for fracture or focal lesion. Sinuses/Orbits: There is sinusitis with lobulated patchy mucosal thickening in the right maxillary,  bilateral anterior ethmoid, and left frontal sinus. There are areas of bone loss along the anterior and low right lamina papyracea and at the right uncinate process. These defects are more likely inflammatory than postsurgical in this setting. Polypoid mucosal thickening seen in the nasal cavity. There is a contiguous soft tissue mass that is fairly dense in the inferior and medial right orbit measuring up to 31 x 25 mm with mass effect on the globe and medial/inferior recti. This orbital mass is nonspecific by imaging (and could be pseudotumor or lymphoma), but in this clinical setting is most consistent with orbital granuloma from the patient's Wegner's. Complete opacification of the left mastoid and middle ear spaces with negative nasopharynx. IMPRESSION: 1. 3 cm right orbital mass with significant proptosis. 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 The mass is contiguous with active sinus disease via a defect in the medial lower orbit. 2. Left mastoid and middle ear opacification with negative nasopharynx. 3. Normal intracranial imaging. Electronically Signed   By: Monte Fantasia M.D.   On: 10/08/2018 10:35   Ct Maxillofacial Wo Contrast  Result Date: 10/09/2018 CLINICAL DATA:  52 y/o M; history of Wegener's disease. Right orbit mass with proptosis. EXAM: CT MAXILLOFACIAL WITHOUT CONTRAST TECHNIQUE: Multidetector CT imaging of the maxillofacial structures was performed. Multiplanar CT image reconstructions were also generated. COMPARISON:  None. FINDINGS: Osseous: No acute fracture or mandibular dislocation. Dental disease with multiple dental caries, absent crowns, and periapical cysts. Orbits: 18 mm dehiscence of the right orbit inferomedial wall (series 7, image 28). Mass within the right orbit involving the medial and inferior extraconal compartments and the inferior aspect of the intraconal  compartment spanning 3.4 x 2.6 x 2.2 cm (AP x ML x CC series 3, image 65 and series 7, image 32). The mass envelops the medial and inferior rectus muscles as well as abuts the optic nerve complex which is displaced superiorly. Associated proptosis of the right globe. No mass or inflammatory process of the left orbit. Sinuses: Diffuse paranasal sinus disease greatest in the right maxillary sinus with there is moderate mucosal thickening and within the opacified left frontal sinus. Left-greater-than-right mastoid effusions. Soft tissues: No mass or inflammatory process of the deep facial compartments. No lymphadenopathy of the face. Limited intracranial: No significant or unexpected finding. IMPRESSION: 1. Nonspecific right orbital mass measuring up to 3.4 cm as described above with proptosis. Findings likely represent an orbital granuloma due to granulomatosis with polyangiitis. Bony dehiscence of the medial wall of the right orbit is also likely associated. Broad differential including but not limited to pseudotumor, lymphoma, and metastasis. 2. Extensive paranasal sinus disease greatest in left frontal and right maxillary sinuses. 3. Left-greater-than-right mastoid effusions. Electronically Signed   By: Kristine Garbe M.D.   On: 10/09/2018 00:25   Medications: . ceFEPime (MAXIPIME) IV 1 g (10/08/18 2153)  . vancomycin 750 mg (10/09/18 0952)   . artificial tears   Right Eye Q6H  . calcitRIOL  1 mcg Oral Q M,W,F-HD  . calcium acetate  1,334 mg Oral TID  WC  . Chlorhexidine Gluconate Cloth  6 each Topical Q0600  . feeding supplement (PRO-STAT SUGAR FREE 64)  30 mL Oral BID  . gabapentin  300 mg Oral BID  . heparin injection (subcutaneous)  5,000 Units Subcutaneous Q8H  . insulin aspart  0-5 Units Subcutaneous QHS  . insulin aspart  0-9 Units Subcutaneous TID WC  . ipratropium-albuterol  3 mL Nebulization QID  . risperiDONE  0.25 mg Oral QHS  . traZODone  100 mg Oral QHS

## 2018-10-09 NOTE — Progress Notes (Signed)
PROGRESS NOTE  James Nicholson:[id number] DOB: May 23, 1966 DOA: 10/04/2018 PCP: [name], MD  HPI/Brief Narrative  James Nicholson is a 52 y.o. year old male of [place] descent with medical history significant for ESRD on HD, ANCA granulomatosis polyangiitis (diagnosed 2010), schizophrenia, history of VT arrest status post ICD (2010), history of latent TB (status post 9 months of isoniazid 04/2012) who presented on 10/04/2018 with worsening cough, dyspnea and blood-tinged sputum after recent hospitalization and discharge (12/1-12/02/2018) for presumed H CAP for which he empirically received IV ceftriaxone and azithromycin and was discharged on cefdinir and azithromycin to complete 7-day course.  Hospital course complicated by: found to have orbital mass on CT scan upon evaluation of left eye redness/proptosis. Concerning for granuloma related to Wegener's. Patient still has intact vision so recommended to continue artificial tears per ophthalmology.  Currently awaiting biopsy results from lung nodule to determine if to continue antibiotics or start immunosuppression.   Subjective Still having the same cough Some pain when blinking but unchanged from prior to hospital stay No changes in vision.  Some abdominal pain when coughing  .   Assessment/Plan:  #Acute hypoxic respiratory failure related to bilateral basilar opacities presumed to be HCAP, stable. Still on 2L [place], on empiric IV vancomycin and zosyn, pending sputum cultures. Previously failed CAP outpatient treatment. Concern for potential wegner's flare or even malignancy so awaiting biopsy results from pulmonary nodule to determine next steps.  Supportive care with albuterol nebs, flutter valve, incentive spirometry.   Monitor blood cultures. Appreciate pulmonary recommendations  #Right eye redness x 69month secondary to right orbital mass causing significant proptosis.  Findings discussed with Dr. [name] [facility]) on 12/12 who recommended artificial tears and follow up with Dr. Manuella Ghazi [facility]). Given patient has no changes in vision these recs were given over phone, will need to re-consult if any changes in vision.   #Pansinusitis.  CT head shows lobulated areas of patchy mucosal thickening in right maxillary, bilateral anterior ethmoid and left frontal sinus and confirmed on dedicated sinus CT. ENT (Dr. Kalman Shan, 12/12) recommends initiation of medical therapy as long as vision remains intact.  #Sepsis syndrome, present on admission, resolved. Presented with tachypnea, elevated white count and concern bilateral airspace disease initial imaging concerning for pneumonia with sepsis syndrome.  White count is slowly improved, patient still having elevated respiratory rate but currently treating for presumed pneumonia  #Left apical pulmonary nodule.  Concern for possible primary bronchogenic neoplasm or could represent Wegeners?  Pulmonary recommends awaiting biopsy results to determine if any evidence of vasculitis and need for potential immunosuppressive therapy.  Status post CT-guided lung biopsy on 12/11 by IR,  #ESRD on HD, Monday Wednesday Friday.  Appreciate nephrology following and managing dialysis sessions.  Continue PhosLo 3 times daily  #Type 2 diabetes.  A1c 4.8%.  Holding home Levemir, monitor CBGs on sliding scale insulin as needed low A1c do not suspect he will need to continue Levemir on discharge  #Schizophrenia, stable.  Continue home Risperdal and trazodone  #History of granulomatosis polyangiitis with renal disease.  Diagnosed in 2010 (previously treated with Cytoxan and steroids, last hospitalization for flare in 2014 with symptoms of epistasis, renal failure and alveolar hemorrhage renal CT scan) unclear if this is a current flare, currently treating empirically for infection, await results of biopsy.  #Peripheral neuropathy in lower extremities.  Continue home  gabapentin  #Normocytic anemia, anemia of chronic disease (ESRD).  Hemoglobin stable at baseline.  Code  Status: Full code  Family Communication: No family at bedside  Disposition Plan: Continues to require IV antibiotics for presumed H CAP, awaiting biopsy results to determine any need to start immunosuppresive therapy if this is indeed a Wegener's flare.    Consultants:  Nephrology  ENT(12/12, Dr. Constance Holster(  Ophthalmology 12/12 (Dr. [name], [facility])  Pulmonary (12/12--on)  Procedures:  Ct guided left upper lobe pulm nodule biopsy 12/11  Prophylaxis: Heparin subcutaneous  Antimicrobials: Anti-infectives (From admission, onward)   Start     Dose/Rate Route Frequency Ordered Stop   10/05/18 1200  vancomycin (VANCOCIN) IVPB 750 mg/150 ml premix     750 mg 150 mL/hr over 60 Minutes Intravenous Every M-W-F (Hemodialysis) 10/04/18 2049     10/04/18 2100  ceFEPIme (MAXIPIME) 1 g in sodium chloride 0.9 % 100 mL IVPB     1 g 200 mL/hr over 30 Minutes Intravenous Every 24 hours 10/04/18 2039     10/04/18 2100  vancomycin (VANCOCIN) 1,750 mg in sodium chloride 0.9 % 500 mL IVPB     1,750 mg 250 mL/hr over 120 Minutes Intravenous  Once 10/04/18 2047 10/05/18 0137      Cultures:  12/10 x 1 blood culture, no growth, 12/8 x 2 blood cultures no growth to date  Telemetry: None  DVT prophylaxis: Heparin prophylaxis   Objective: Vitals:   10/09/18 1030 10/09/18 1045 10/09/18 1106 10/09/18 1152  BP: 93/65 (!) 79/52 94/61 119/74  Pulse: 100 97 89 (!) 101  Resp:   17 (!) 22  Temp:   (!) 97.5 F (36.4 C) 98.4 F (36.9 C)  TempSrc:   Oral Oral  SpO2:   94% 99%  Weight:   75.9 kg   Height:        Intake/Output Summary (Last 24 hours) at 10/09/2018 1448 Last data filed at 10/09/2018 1106 Gross per 24 hour  Intake 740 ml  Output 1712 ml  Net -972 ml   Filed Weights   10/08/18 0024 10/09/18 0700 10/09/18 1106  Weight: 76.9 kg 77.6 kg 75.9 kg     Exam:  Constitutional: Normal appearing male, lying in bed, no distress Eyes:  diffuse redness of entire right sclera and significant proptosis , ocular movement still intact, able to pass finger exam ENMT: No sinus tenderness, Oropharynx with moist mucous membranes Cardiovascular:  no peripheral edema Respiratory: intermittent rhonchi minimal wheezing,  no accessory muscle use, on 2 L [place] Abdomen: Soft,non-tender, normal bowel sounds Skin: No rash ulcers, or lesions. Without skin tenting  Neurologic: Grossly no focal neuro deficit. Psychiatric:Appropriate affect, and mood.   Data Reviewed: CBC: Recent Labs  Lab 10/04/18 1808 10/04/18 2133 10/05/18 0204 10/06/18 0154 10/07/18 0148 10/09/18 0709  WBC 18.5* 17.9* 16.4* 13.5* 10.9* 11.8*  NEUTROABS 14.5*  --  12.3* 10.0*  --   --   HGB 9.2* 9.3* 8.5* 8.7* 8.5* 8.7*  HCT 30.6* 29.8* 28.2* 28.0* 27.5* 27.8*  MCV 99.7 98.3 98.6 97.6 97.2 98.9  PLT 332 313 313 283 253 536   Basic Metabolic Panel: Recent Labs  Lab 10/05/18 0204 10/06/18 0154 10/07/18 0148 10/08/18 0142 10/09/18 0308  NA 141 139 138 138 138  K 5.0 4.1 4.4 3.9 4.5  CL 102 97* 95* 96* 96*  CO2 21* 27 26 26 22   GLUCOSE 94 105* 124* 105* 116*  BUN 106* 42* 68* 29* 68*  CREATININE 15.98* 8.46* 11.73* 6.54* 10.15*  CALCIUM 8.9 8.6* 9.1 8.5* 9.0  MG 2.4 2.2 2.4 2.1 2.5*  GFR: Estimated Creatinine Clearance: 8.6 mL/min (A) (by C-G formula based on SCr of 10.15 mg/dL (H)). Liver Function Tests: Recent Labs  Lab 10/04/18 1808  AST 17  ALT 30  ALKPHOS 51  BILITOT 1.0  PROT 7.7  ALBUMIN 2.6*   No results for input(s): LIPASE, AMYLASE in the last 168 hours. No results for input(s): AMMONIA in the last 168 hours. Coagulation Profile: Recent Labs  Lab 10/06/18 0154  INR 1.33   Cardiac Enzymes: No results for input(s): CKTOTAL, CKMB, CKMBINDEX, TROPONINI in the last 168 hours. BNP (last 3 results) No results for input(s): PROBNP in the last 8760  hours. HbA1C: No results for input(s): HGBA1C in the last 72 hours. CBG: Recent Labs  Lab 10/08/18 0800 10/08/18 1249 10/08/18 1732 10/08/18 2144 10/09/18 1153  GLUCAP 126* 87 155* 148* 104*   Lipid Profile: No results for input(s): CHOL, HDL, LDLCALC, TRIG, CHOLHDL, LDLDIRECT in the last 72 hours. Thyroid Function Tests: No results for input(s): TSH, T4TOTAL, FREET4, T3FREE, THYROIDAB in the last 72 hours. Anemia Panel: No results for input(s): VITAMINB12, FOLATE, FERRITIN, TIBC, IRON, RETICCTPCT in the last 72 hours. Urine analysis:    Component Value Date/Time   COLORURINE RED (A) 07/06/2016 1938   APPEARANCEUR TURBID (A) 07/06/2016 1938   LABSPEC 1.020 07/06/2016 1938   PHURINE 7.5 07/06/2016 1938   GLUCOSEU NEGATIVE 07/06/2016 1938   HGBUR LARGE (A) 07/06/2016 1938   BILIRUBINUR NEGATIVE 07/06/2016 1938   KETONESUR NEGATIVE 07/06/2016 1938   PROTEINUR >300 (A) 07/06/2016 1938   UROBILINOGEN 0.2 11/04/2014 0952   NITRITE NEGATIVE 07/06/2016 1938   LEUKOCYTESUR SMALL (A) 07/06/2016 1938   Sepsis Labs: @LABRCNTIP (procalcitonin:4,lacticidven:4)  ) Recent Results (from the past 240 hour(s))  Culture, blood (routine x 2)     Status: None   Collection Time: 10/04/18  8:09 PM  Result Value Ref Range Status   Specimen Description BLOOD LEFT ARM  Final   Special Requests   Final    BOTTLES DRAWN AEROBIC AND ANAEROBIC Blood Culture adequate volume   Culture   Final    NO GROWTH 5 DAYS Performed at [facility], [place] [address] [place], [place] [address]    Report Status 10/09/2018 FINAL  Final  Culture, blood (routine x 2)     Status: None   Collection Time: 10/04/18  8:50 PM  Result Value Ref Range Status   Specimen Description BLOOD LEFT FOREARM  Final   Special Requests   Final    BOTTLES DRAWN AEROBIC AND ANAEROBIC Blood Culture adequate volume   Culture   Final    NO GROWTH 5 DAYS Performed at [facility], [place] [address], [place], [place]  [address]    Report Status 10/09/2018 FINAL  Final  Culture, blood (Routine X 2) w Reflex to ID Panel     Status: None (Preliminary result)   Collection Time: 10/06/18  4:30 PM  Result Value Ref Range Status   Specimen Description BLOOD LEFT HAND  Final   Special Requests   Final    BOTTLES DRAWN AEROBIC ONLY Blood Culture results may not be optimal due to an inadequate volume of blood received in culture bottles   Culture   Final    NO GROWTH 3 DAYS Performed at [facility], [place] [address] [place], [place] [address]    Report Status PENDING  Incomplete  Expectorated sputum assessment w rflx to resp cult     Status: None   Collection  Time: 10/08/18  8:16 PM  Result Value Ref Range Status   Specimen Description EXPECTORATED SPUTUM  Final   Special Requests Normal  Final   Sputum evaluation   Final    THIS SPECIMEN IS ACCEPTABLE FOR SPUTUM CULTURE Performed at [facility], [address], [place], [place] [address]    Report Status 10/09/2018 FINAL  Final  Culture, respiratory     Status: None (Preliminary result)   Collection Time: 10/08/18  8:16 PM  Result Value Ref Range Status   Specimen Description EXPECTORATED SPUTUM  Final   Special Requests Normal Reflexed from [id number]  Final   Gram Stain   Final    ABUNDANT WBC PRESENT,BOTH PMN AND MONONUCLEAR RARE GRAM POSITIVE COCCI RARE GRAM VARIABLE ROD Performed at [facility], [place] [address] [place], [place] [address]    Culture PENDING  Incomplete   Report Status PENDING  Incomplete      Studies: Ct Maxillofacial Wo Contrast  Result Date: 10/09/2018 CLINICAL DATA:  52 y/o M; history of Wegener's disease. Right orbit mass with proptosis. EXAM: CT MAXILLOFACIAL WITHOUT CONTRAST TECHNIQUE: Multidetector CT imaging of the maxillofacial structures was performed. Multiplanar CT image reconstructions were also generated. COMPARISON:  None. FINDINGS: Osseous: No acute fracture or mandibular dislocation. Dental  disease with multiple dental caries, absent crowns, and periapical cysts. Orbits: 18 mm dehiscence of the right orbit inferomedial wall (series 7, image 28). Mass within the right orbit involving the medial and inferior extraconal compartments and the inferior aspect of the intraconal compartment spanning 3.4 x 2.6 x 2.2 cm (AP x ML x CC series 3, image 65 and series 7, image 32). The mass envelops the medial and inferior rectus muscles as well as abuts the optic nerve complex which is displaced superiorly. Associated proptosis of the right globe. No mass or inflammatory process of the left orbit. Sinuses: Diffuse paranasal sinus disease greatest in the right maxillary sinus with there is moderate mucosal thickening and within the opacified left frontal sinus. Left-greater-than-right mastoid effusions. Soft tissues: No mass or inflammatory process of the deep facial compartments. No lymphadenopathy of the face. Limited intracranial: No significant or unexpected finding. IMPRESSION: 1. Nonspecific right orbital mass measuring up to 3.4 cm as described above with proptosis. Findings likely represent an orbital granuloma due to granulomatosis with polyangiitis. Bony dehiscence of the medial wall of the right orbit is also likely associated. Broad differential including but not limited to pseudotumor, lymphoma, and metastasis. 2. Extensive paranasal sinus disease greatest in left frontal and right maxillary sinuses. 3. Left-greater-than-right mastoid effusions. Electronically Signed   By: Kristine Garbe M.D.   On: 10/09/2018 00:25    Scheduled Meds: . artificial tears   Right Eye Q6H  . calcitRIOL  1 mcg Oral Q M,W,F-HD  . calcium acetate  1,334 mg Oral TID WC  . Chlorhexidine Gluconate Cloth  6 each Topical Q0600  . feeding supplement (PRO-STAT SUGAR FREE 64)  30 mL Oral BID  . gabapentin  300 mg Oral BID  . heparin injection (subcutaneous)  5,000 Units Subcutaneous Q8H  . insulin aspart  0-5 Units  Subcutaneous QHS  . insulin aspart  0-9 Units Subcutaneous TID WC  . ipratropium-albuterol  3 mL Nebulization QID  . risperiDONE  0.25 mg Oral QHS  . traZODone  100 mg Oral QHS    Continuous Infusions: . ceFEPime (MAXIPIME) IV 1 g (10/08/18 2153)  . vancomycin 750 mg (10/09/18 0952)     LOS:  5 days     Desiree Hane, MD [facility] Pager [phone]  If 7PM-7AM, please contact night-coverage [email or web address] Password [facility] 10/09/2018, 2:48 PM

## 2018-10-09 NOTE — Progress Notes (Addendum)
[facility] Progress Note   Subjective:  Has soft tissue mass pushing on the R eye per CT. No new c/o. On HD  Objective Vitals:   10/09/18 1030 10/09/18 1045 10/09/18 1106 10/09/18 1152  BP: 93/65 (!) 79/52 94/61 119/74  Pulse: 100 97 89 (!) 101  Resp:   17 (!) 22  Temp:   (!) 97.5 F (36.4 C) 98.4 F (36.9 C)  TempSrc:   Oral Oral  SpO2:   94% 99%  Weight:   75.9 kg   Height:       Physical Exam General:Ill appearing man, NAD, no sig cough today Heart:RRR; 2/6 systolic murmur Lungs: scattered rhonchi, occ wheezes, improving Abdomen:Soft, non-tender Extremities:No LE edema Dialysis Access:AVF + thrill   Dialysis Orders: MWFat [facility] 4h 400/800 77.5kg 2/2 bath R AVF Hep 7600 - Mircera75 every 2 wks (looks like ordered after last admit, but not given) -Calcitriol73mcg PO q HD  Assessment/Plan: 1.HCAP (recurrent/unresolved): dense RLL consolidation by CT.  On IV Vanc/Cefepime. SP lung biopsy results pend. CT showing R orbit soft tissue mass.  ENT consulting. H/o Wegener's.   2. ESRD on HD: HD MWF.  HD today 3.HTN/volume:BP ok, no edema on exam. Keeping same EDW. 4. Anemia ckd:Hgb 8.5, due for ESA, but awaiting prelim lung nodule Bx at this point (would be contraindicated if + malignancy) 5. Secondary hyperparathyroidism:Ca/Phos ok. Continue home meds. 6. Nutrition:Alb very low, continue Pro-stat BID    Kelly Splinter MD [employer] pgr [phone]   10/09/2018, 1:15 PM          Additional Objective Labs: Basic Metabolic Panel: Recent Labs  Lab 10/07/18 0148 10/08/18 0142 10/09/18 0308  NA 138 138 138  K 4.4 3.9 4.5  CL 95* 96* 96*  CO2 26 26 22   GLUCOSE 124* 105* 116*  BUN 68* 29* 68*  CREATININE 11.73* 6.54* 10.15*  CALCIUM 9.1 8.5* 9.0   Liver Function Tests: Recent Labs  Lab 10/04/18 1808  AST 17  ALT 30  ALKPHOS 51  BILITOT 1.0  PROT 7.7  ALBUMIN 2.6*   CBC: Recent Labs  Lab  10/04/18 1808 10/04/18 2133 10/05/18 0204 10/06/18 0154 10/07/18 0148 10/09/18 0709  WBC 18.5* 17.9* 16.4* 13.5* 10.9* 11.8*  NEUTROABS 14.5*  --  12.3* 10.0*  --   --   HGB 9.2* 9.3* 8.5* 8.7* 8.5* 8.7*  HCT 30.6* 29.8* 28.2* 28.0* 27.5* 27.8*  MCV 99.7 98.3 98.6 97.6 97.2 98.9  PLT 332 313 313 283 253 242   CBG: Recent Labs  Lab 10/08/18 0800 10/08/18 1249 10/08/18 1732 10/08/18 2144 10/09/18 1153  GLUCAP 126* 87 155* 148* 104*   Studies/Results: Ct Head Wo Contrast  Result Date: 10/08/2018 CLINICAL DATA:  Orbital asymmetry/proptosis. History of Wegner's granulomatosis. End-stage renal disease EXAM: CT HEAD WITHOUT CONTRAST TECHNIQUE: Contiguous axial images were obtained from the base of the skull through the vertex without intravenous contrast. COMPARISON:  None. FINDINGS: Brain: No evidence of acute infarction, hemorrhage, hydrocephalus, extra-axial collection or mass lesion/mass effect. Vascular: No hyperdense vessel or unexpected calcification. Skull: Normal. Negative for fracture or focal lesion. Sinuses/Orbits: There is sinusitis with lobulated patchy mucosal thickening in the right maxillary, bilateral anterior ethmoid, and left frontal sinus. There are areas of bone loss along the anterior and low right lamina papyracea and at the right uncinate process. These defects are more likely inflammatory than postsurgical in this setting. Polypoid mucosal thickening seen in the nasal cavity. There is a contiguous soft tissue  mass that is fairly dense in the inferior and medial right orbit measuring up to 31 x 25 mm with mass effect on the globe and medial/inferior recti. This orbital mass is nonspecific by imaging (and could be pseudotumor or lymphoma), but in this clinical setting is most consistent with orbital granuloma from the patient's Wegner's. Complete opacification of the left mastoid and middle ear spaces with negative nasopharynx. IMPRESSION: 1. 3 cm right orbital mass with  significant proptosis. 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 The mass is contiguous with active sinus disease via a defect in the medial lower orbit. 2. Left mastoid and middle ear opacification with negative nasopharynx. 3. Normal intracranial imaging. Electronically Signed   By: Monte Fantasia M.D.   On: 10/08/2018 10:35   Ct Maxillofacial Wo Contrast  Result Date: 10/09/2018 CLINICAL DATA:  52 y/o M; history of Wegener's disease. Right orbit mass with proptosis. EXAM: CT MAXILLOFACIAL WITHOUT CONTRAST TECHNIQUE: Multidetector CT imaging of the maxillofacial structures was performed. Multiplanar CT image reconstructions were also generated. COMPARISON:  None. FINDINGS: Osseous: No acute fracture or mandibular dislocation. Dental disease with multiple dental caries, absent crowns, and periapical cysts. Orbits: 18 mm dehiscence of the right orbit inferomedial wall (series 7, image 28). Mass within the right orbit involving the medial and inferior extraconal compartments and the inferior aspect of the intraconal compartment spanning 3.4 x 2.6 x 2.2 cm (AP x ML x CC series 3, image 65 and series 7, image 32). The mass envelops the medial and inferior rectus muscles as well as abuts the optic nerve complex which is displaced superiorly. Associated proptosis of the right globe. No mass or inflammatory process of the left orbit. Sinuses: Diffuse paranasal sinus disease greatest in the right maxillary sinus with there is moderate mucosal thickening and within the opacified left frontal sinus. Left-greater-than-right mastoid effusions. Soft tissues: No mass or inflammatory process of the deep facial compartments. No lymphadenopathy of the face. Limited intracranial: No significant or unexpected finding. IMPRESSION: 1. Nonspecific right orbital mass measuring up to 3.4 cm as described above with  proptosis. Findings likely represent an orbital granuloma due to granulomatosis with polyangiitis. Bony dehiscence of the medial wall of the right orbit is also likely associated. Broad differential including but not limited to pseudotumor, lymphoma, and metastasis. 2. Extensive paranasal sinus disease greatest in left frontal and right maxillary sinuses. 3. Left-greater-than-right mastoid effusions. Electronically Signed   By: Kristine Garbe M.D.   On: 10/09/2018 00:25   Medications: . ceFEPime (MAXIPIME) IV 1 g (10/08/18 2153)  . vancomycin 750 mg (10/09/18 0952)   . artificial tears   Right Eye Q6H  . calcitRIOL  1 mcg Oral Q M,W,F-HD  . calcium acetate  1,334 mg Oral TID WC  . Chlorhexidine Gluconate Cloth  6 each Topical Q0600  . feeding supplement (PRO-STAT SUGAR FREE 64)  30 mL Oral BID  . gabapentin  300 mg Oral BID  . heparin injection (subcutaneous)  5,000 Units Subcutaneous Q8H  . insulin aspart  0-5 Units Subcutaneous QHS  . insulin aspart  0-9 Units Subcutaneous TID WC  . ipratropium-albuterol  3 mL Nebulization QID  . risperiDONE  0.25 mg Oral QHS  . traZODone  100 mg Oral QHS

## 2018-10-09 NOTE — Progress Notes (Addendum)
NAMEIren Nicholson, MRN:  [id number], DOB:  [birth year], LOS: 5 ADMISSION DATE:  10/04/2018, CONSULTATION DATE:  10/08/18 REFERRING MD:  Theodoro Grist MD, CHIEF COMPLAINT: Slow to resolve pneumonia.  Brief History   52 year old with history of Wegener's disease, renal failure.  Biopsy-proven ANCA positive necrotizing glomerulonephritis.Former smoker , quit 2010, no pack year history documented.  Recent admission from 12/1/5 to 10/01/2018 for pneumonia.  Readmitted on 12/12 for recurrence of symptoms. Underwent CT-guided biopsy of apical lung mass.  PCCM consulted 12/12 slow to resolve symptoms.  Past Medical History  History of atrial fibrillation, cardiomyopathy s/p AICD, ESRD, schizophrenia, latent TB status post INH therapy in 2014.  Biopsy-proven ANCA positive necrotizing glomerulonephritis treated with Cytoxan and steroids in 2010 in [place], admitted in 2014 for alveolar hemorrhage, renal failure requiring Cytoxan and steroids.  [name]  Significant Hospital Events   10/04/2018>> Hospital Admission  Consults:  12/2 Nephrology 12/10 IR  Procedures:  12/11- CT guided biopsy of apical mass  Significant Diagnostic Tests:  CT chest 10/04/2018- bilateral patchy airspace disease, 17 cm left apical nodule, aortic atherosclerosis. I have reviewed the images personally  Head CT 12/12-pending  Micro Data:  Blood cx 12/8- No growth Blood cx 12/10- No growth 12/12 Respiratory>> GS>> Rare G + cocci, rare G variable rod>>  Antimicrobials:  Vanco 12/9>> Cefepime 12/8 >>  Interim history/subjective:  Remains on 2 L [place] with sats of 99%. Respirations are shallow and rapid.  Objective   Blood pressure 119/74, pulse (!) 101, temperature 98.4 F (36.9 C), temperature source Oral, resp. rate (!) 22, height 5\' 9"  (1.753 m), weight 75.9 kg, SpO2 99 %.    FiO2 (%):  [28 %] 28 %   Intake/Output Summary (Last 24 hours) at 10/09/2018 1157 Last data  filed at 10/09/2018 1106 Gross per 24 hour  Intake 740 ml  Output 1712 ml  Net -972 ml   Filed Weights   10/08/18 0024 10/09/18 0700 10/09/18 1106  Weight: 76.9 kg 77.6 kg 75.9 kg    Examination: Gen:      No acute distress, OOB in chair, on 2 L [place]  HEENT: NCAT.MMPM, no CO eye pain 12/13, but slight redness noted Neck:     No masses; no thyromegaly, No LAD Lungs:    Bilateral chest excursion, shallow respirations, diminished per bases, Scattered rhonchi, crackles per bases bilaterally, + cough CV:         S1, S2, Regular rate and rhythm; no MRG Abd:      + bowel sounds; soft, non-tender; no palpable masses, no distension, Body mass index is 24.71 kg/m. Ext:    No obvious deformities, No edema; adequate peripheral perfusion Skin:      Warm and dry; no rash or lesions noted Neuro: alert and oriented x 3, MAE x 4 Psych: anxious  Resolved Hospital Problem list     Assessment & Plan:  52 year old with history of GPA, pulmonary renal syndrome in 2014 Admitted with recurrent pneumonia  CT  shows bilateral opacities which is consistent with pneumonia.  He does have an apical nodule concerning for malignancy.  Differential diagnosis also includes flare of GPA.  He has history of latent TB and has completed INH therapy in 2014 so suspicion for reactivation of TB is low. Biopsy results ( 12/11)  of apical mass pending  Lung infiltrates, nodule Leukocytosis>> afebrile Plan Continue  antibiotic therapy Follow biopsy results/ path.>> pending 12/13 If there is evidence  of vasculitis then he may need to resume  immunosuppressives Follow sputum cultures/ micro Trend CXR Trend fever and WBC Agressive pulmonary toilet IS Q 1 while awake OOB to chair/ mobilize   Right eye redness. ? vasculitis affecting the eye Ophthalmology consult. Follow head CT.  Check for sinus disease as well on scan  End stage renal disease Hemodialysis per nephrology  Labs   CBC: Recent Labs  Lab  10/04/18 1808 10/04/18 2133 10/05/18 0204 10/06/18 0154 10/07/18 0148 10/09/18 0709  WBC 18.5* 17.9* 16.4* 13.5* 10.9* 11.8*  NEUTROABS 14.5*  --  12.3* 10.0*  --   --   HGB 9.2* 9.3* 8.5* 8.7* 8.5* 8.7*  HCT 30.6* 29.8* 28.2* 28.0* 27.5* 27.8*  MCV 99.7 98.3 98.6 97.6 97.2 98.9  PLT 332 313 313 283 253 614    Basic Metabolic Panel: Recent Labs  Lab 10/05/18 0204 10/06/18 0154 10/07/18 0148 10/08/18 0142 10/09/18 0308  NA 141 139 138 138 138  K 5.0 4.1 4.4 3.9 4.5  CL 102 97* 95* 96* 96*  CO2 21* 27 26 26 22   GLUCOSE 94 105* 124* 105* 116*  BUN 106* 42* 68* 29* 68*  CREATININE 15.98* 8.46* 11.73* 6.54* 10.15*  CALCIUM 8.9 8.6* 9.1 8.5* 9.0  MG 2.4 2.2 2.4 2.1 2.5*   GFR: Estimated Creatinine Clearance: 8.6 mL/min (A) (by C-G formula based on SCr of 10.15 mg/dL (H)). Recent Labs  Lab 10/04/18 1816  10/05/18 0204 10/06/18 0154 10/07/18 0148 10/09/18 0709  WBC  --    < > 16.4* 13.5* 10.9* 11.8*  LATICACIDVEN 0.97  --   --   --   --   --    < > = values in this interval not displayed.    Liver Function Tests: Recent Labs  Lab 10/04/18 1808  AST 17  ALT 30  ALKPHOS 51  BILITOT 1.0  PROT 7.7  ALBUMIN 2.6*   No results for input(s): LIPASE, AMYLASE in the last 168 hours. No results for input(s): AMMONIA in the last 168 hours.  ABG    Component Value Date/Time   PHART 7.289 (L) 02/05/2013 0146   PCO2ART 29.0 (L) 02/05/2013 0146   PO2ART 81.0 02/05/2013 0146   HCO3 13.9 (L) 02/05/2013 0146   TCO2 29 07/07/2016 0843   ACIDBASEDEF 12.0 (H) 02/05/2013 0146   O2SAT 95.0 02/05/2013 0146     Coagulation Profile: Recent Labs  Lab 10/06/18 0154  INR 1.33    Cardiac Enzymes: No results for input(s): CKTOTAL, CKMB, CKMBINDEX, TROPONINI in the last 168 hours.  HbA1C: Hgb A1c MFr Bld  Date/Time Value Ref Range Status  10/04/2018 09:34 PM 4.8 4.8 - 5.6 % Final    Comment:    (NOTE) Pre diabetes:          5.7%-6.4% Diabetes:               >6.4% Glycemic control for   <7.0% adults with diabetes   12/21/2013 08:10 PM 4.5 <5.7 % Final    Comment:    (NOTE)                                                                       According to the ADA Clinical Practice Recommendations  for 2011, when HbA1c is used as a screening test:  >=6.5%   Diagnostic of Diabetes Mellitus           (if abnormal result is confirmed) 5.7-6.4%   Increased risk of developing Diabetes Mellitus References:Diagnosis and Classification of Diabetes Mellitus,Diabetes [id number],9622,29(NLGXQ 1):S62-S69 and Standards of Medical Care in         Diabetes - 2011,Diabetes JJHE,1740,81 (Suppl 1):S11-S61.    CBG: Recent Labs  Lab 10/07/18 1929 10/08/18 0800 10/08/18 1249 10/08/18 1732 10/08/18 2144  GLUCAP 118* 126* 58 155* 148*     Past Medical History  He,  has a past medical history of Anemia, Atrial fibrillation, Cardiomyopathy ([place]) (2010), Cellulitis, Chronic kidney disease (CKD), stage V ([place]), Dyslipidemia, Enterobacter sepsis ([place]), Hyperlipidemia, Hypertension, IDDM (insulin dependent diabetes mellitus) ([place]) (11/03/2014), S/P ICD (internal cardiac defibrillator) procedure (2010), Schizophrenia ([place]), and Wegener's disease, pulmonary ([place]) (02/05/2013).   Surgical History    Past Surgical History:  Procedure Laterality Date  . AV FISTULA PLACEMENT Right 02/10/2013   Procedure: ARTERIOVENOUS (AV) FISTULA CREATION;  Surgeon: Rosetta Posner, MD;  Location: [facility] OR;  Service: Vascular;  Laterality: Right;  Right forearm radial/cephalic arterovenous fistula.   Marland Kitchen CARDIAC CATHETERIZATION  2010   [place]: In setting of VT arrest. Per brother's report, nonobstructive with no intervention  . CARDIAC DEFIBRILLATOR PLACEMENT  2010   [place]  . FISTULOGRAM Right 08/09/2013   Procedure: FISTULOGRAM;  Surgeon: Angelia Mould, MD;  Location: [facility] CATH LAB;  Service: Cardiovascular;  Laterality: Right;  . ICD GENERATOR CHANGEOUT N/A 12/05/2017   Procedure: ICD  GENERATOR CHANGEOUT;  Surgeon: Evans Lance, MD;  Location: [facility] CV LAB;  Service: Cardiovascular;  Laterality: N/A;  . LIGATION OF COMPETING BRANCHES OF ARTERIOVENOUS FISTULA Right 08/13/2013   Procedure: LIGATION OF COMPETING BRANCHES X5 OF ARTERIOVENOUS FISTULA- RIGHT ARM;  Surgeon: Serafina Mitchell, MD;  Location: [facility] OR;  Service: Vascular;  Laterality: Right;     Social History   reports that he quit smoking about 9 years ago. He has never used smokeless tobacco. He reports that he does not drink alcohol or use drugs.   Family History   His family history includes CAD in his mother.   Allergies No Known Allergies   Home Medications  Prior to Admission medications   Medication Sig Start Date End Date Taking? Authorizing Provider  calcium acetate (PHOSLO) 667 MG capsule Take 1,334 mg by mouth 3 (three) times daily with meals. 8a, 12p, 5p 02/11/13  Yes Regalado, Belkys A, MD  gabapentin (NEURONTIN) 300 MG capsule Take 300 mg by mouth 2 (two) times daily.   Yes [provider]  gemfibrozil (LOPID) 600 MG tablet Take 600 mg by mouth 2 (two) times daily before a meal.   Yes [provider]  insulin detemir (LEVEMIR) 100 UNIT/ML injection Inject 0.05 mLs (5 Units total) into the skin at bedtime. 02/11/13  Yes Regalado, Belkys A, MD  metoprolol succinate (TOPROL-XL) 50 MG 24 hr tablet Take 50 mg by mouth daily. Take with or immediately following a meal.    Yes [provider]  omeprazole (PRILOSEC) 20 MG capsule Take 20 mg by mouth every morning.    Yes [provider]  risperiDONE (RISPERDAL) 0.25 MG tablet Take 0.25 mg by mouth at bedtime.   Yes [provider]  traZODone (DESYREL) 100 MG tablet Take 100 mg by mouth at bedtime.   Yes [provider]     Magdalen Spatz, AGACNP-BC  [facility] Pager [phone] 10/09/2018, 11:57 AM

## 2018-10-09 NOTE — Progress Notes (Signed)
Pharmacy Antibiotic Note  James Nicholson is a 52 y.o. male admitted on 10/04/2018 with pneumonia.  Pharmacy has been consulted for Vancomycin dosing.   The patient is ESRD-MWF and has been loaded with Vancomycin and receiving appropriate maintenance doses. Current dosing remains appropriate. Today is abx D#6  Plan: - Cont Vancomycin 750mg  IV qHD MWF - Cont Cefepime 1g IV q24h - Will continue to follow HD schedule/duration, culture results, LOT, and antibiotic de-escalation plans   Height: 5\' 9"  (175.3 cm) Weight: 171 lb 1.2 oz (77.6 kg) IBW/kg (Calculated) : 70.7  Temp (24hrs), Avg:98.5 F (36.9 C), Min:98.3 F (36.8 C), Max:98.8 F (37.1 C)  Recent Labs  Lab 10/04/18 1816 10/04/18 2133 10/05/18 0204 10/06/18 0154 10/07/18 0148 10/08/18 0142 10/09/18 0308 10/09/18 0709  WBC  --  17.9* 16.4* 13.5* 10.9*  --   --  11.8*  CREATININE  --  15.51* 15.98* 8.46* 11.73* 6.54* 10.15*  --   LATICACIDVEN 0.97  --   --   --   --   --   --   --     Estimated Creatinine Clearance: 8.6 mL/min (A) (by C-G formula based on SCr of 10.15 mg/dL (H)).    No Known Allergies  Thank you for allowing pharmacy to be a part of this patient's care. Anette Guarneri, PharmD [phone] Please check AMION for all [place] numbers 10/09/2018 10:33 AM

## 2018-10-10 ENCOUNTER — Inpatient Hospital Stay (HOSPITAL_COMMUNITY): Payer: Medicare Other

## 2018-10-10 LAB — CBC
HCT: 31.6 % — ABNORMAL LOW (ref 39.0–52.0)
Hemoglobin: 9.7 g/dL — ABNORMAL LOW (ref 13.0–17.0)
MCH: 30.1 pg (ref 26.0–34.0)
MCHC: 30.7 g/dL (ref 30.0–36.0)
MCV: 98.1 fL (ref 80.0–100.0)
Platelets: 289 10*3/uL (ref 150–400)
RBC: 3.22 MIL/uL — ABNORMAL LOW (ref 4.22–5.81)
RDW: 14.6 % (ref 11.5–15.5)
WBC: 11.5 10*3/uL — ABNORMAL HIGH (ref 4.0–10.5)
nRBC: 0 % (ref 0.0–0.2)

## 2018-10-10 LAB — BASIC METABOLIC PANEL
Anion gap: 22 — ABNORMAL HIGH (ref 5–15)
BUN: 59 mg/dL — ABNORMAL HIGH (ref 6–20)
CO2: 22 mmol/L (ref 22–32)
Calcium: 9.8 mg/dL (ref 8.9–10.3)
Chloride: 94 mmol/L — ABNORMAL LOW (ref 98–111)
Creatinine, Ser: 7.56 mg/dL — ABNORMAL HIGH (ref 0.61–1.24)
GFR calc Af Amer: 9 mL/min — ABNORMAL LOW (ref >60)
GFR calc non Af Amer: 7 mL/min — ABNORMAL LOW (ref >60)
Glucose, Bld: 142 mg/dL — ABNORMAL HIGH (ref 70–99)
Potassium: 4.7 mmol/L (ref 3.5–5.1)
Sodium: 138 mmol/L (ref 135–145)

## 2018-10-10 LAB — GLUCOSE, CAPILLARY
Glucose-Capillary: 160 mg/dL — ABNORMAL HIGH (ref 70–99)
Glucose-Capillary: 152 mg/dL — ABNORMAL HIGH (ref 70–99)
Glucose-Capillary: 146 mg/dL — ABNORMAL HIGH (ref 70–99)
Glucose-Capillary: 176 mg/dL — ABNORMAL HIGH (ref 70–99)

## 2018-10-10 MED ORDER — SALINE SPRAY 0.65 % NA SOLN
1.0000 | Freq: Every day | NASAL | Status: DC
  Administered 2018-10-10 – 2018-10-15 (×6): 1 via NASAL
  Filled 2018-10-10: qty 44

## 2018-10-10 NOTE — Progress Notes (Signed)
PROGRESS NOTE  James Nicholson VOJ:[id number] DOB: 1966-07-11 DOA: 10/04/2018 PCP: [name], MD  HPI/Brief Narrative  James Nicholson is a 52 y.o. year old male of [place] descent with medical history significant for ESRD on HD, ANCA granulomatosis polyangiitis (diagnosed 2010), schizophrenia, history of VT arrest status post ICD (2010), history of latent TB (status post 9 months of isoniazid 04/2012) who presented on 10/04/2018 with worsening cough, dyspnea and blood-tinged sputum after recent hospitalization and discharge (12/1-12/02/2018) for presumed H CAP for which he empirically received IV ceftriaxone and azithromycin and was discharged on cefdinir and azithromycin to complete 7-day course.  Hospital course complicated by: found to have orbital mass on CT scan upon evaluation of left eye redness/proptosis. Concerning for granuloma related to Wegener's. Patient still has intact vision so recommended to continue artificial tears per ophthalmology.  Currently awaiting biopsy results from lung nodule to determine if to continue antibiotics or start immunosuppression.   Subjective Cough improved No fevers chills or chest pain Denies any changes in vision or eye pain.   Assessment/Plan:  #Acute hypoxic respiratory failure related to bilateral basilar opacities secondary to active Wegener's flare, stable. Still on 2L [place], pulmonary nodule positive for granulomatous disease presume respiratory issues predominantly from active Wegener's flare.  Patient started on Solu-Medrol pulse therapy on 12/13.  Discontinue vancomycin given sputum culture negative for MRSA.  Pulmonology recommends continuing cefepime for total 7 days (start date 12/8).  As long as infectious etiology thought less likely nephrology recommends Rituxan prior to discharge and repeat in 2 weeks.  Continue to monitor.   Supportive care with albuterol nebs, flutter valve, incentive spirometry.     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  Right eye redness x 66month secondary to right orbital mass causing significant proptosis secondary to active Wegener's flare.  Findings discussed with Dr. [name] [facility]) on 12/12 who recommended artificial tears and follow up with Dr. Manuella Ghazi [facility]). Given patient has no changes in vision these recs were given over phone, will need to re-consult if any changes in vision.  Discussed with ophthalmology on 12/13 getting formal eye exam for accurate baseline.  #Pansinusitis.  CT head shows lobulated areas of patchy mucosal thickening in right maxillary, bilateral anterior ethmoid and left frontal sinus and confirmed on dedicated sinus CT. ENT wheeze with initiation of medical therapy as long as vision remains intact.  Will follow up as an outpatient, nasal saline to reduce dryness or bleeding and crusting in the nasal passageway  #Sepsis syndrome, present on admission, resolved. Presented with tachypnea, elevated white count and concern bilateral airspace disease initial imaging concerning for pneumonia with sepsis syndrome.  White count is slowly improved, patient still having elevated respiratory rate but currently treating for presumed pneumonia  #Left apical pulmonary nodule.   Status post CT-guided lung biopsy on 12/11 by IR, consistent with granulomatous disease as mentioned above  #ESRD on HD, Monday Wednesday Friday.  Appreciate nephrology following and managing dialysis sessions.  Continue PhosLo 3 times daily  #Type 2 diabetes.  A1c 4.8%.  Holding home Levemir, monitor CBGs on sliding scale insulin as needed low A1c do not suspect he will need to continue Levemir on discharge  #Schizophrenia, stable.  Continue home Risperdal and trazodone  #History of granulomatosis polyangiitis with renal disease.  Diagnosed in 2010 (previously treated with Cytoxan and steroids, last hospitalization for flare in 2014 with symptoms of epistasis,  renal failure and alveolar hemorrhage renal CT scan) unclear  if this is a current flare, currently treating empirically for infection, await results of biopsy.  #Peripheral neuropathy in lower extremities.  Continue home gabapentin  #Normocytic anemia, anemia of chronic disease (ESRD).  Hemoglobin stable at baseline.  Code Status: Full code  Family Communication: No family at bedside, will discuss with brother  Disposition Plan: [place] IV cefepime as recommended by pulmonology, currently on IV Solu-Medrol pulse dosing for active Wegener's flare.  Consultants:  Nephrology  ENT(12/12, Dr. Rosen)12/14 Dr. Jerrell Belfast  Ophthalmology 12/12 (Dr. [name], [facility]),   Pulmonary (12/12--on)  Procedures:  Ct guided left upper lobe pulm nodule biopsy 12/11  Prophylaxis: Heparin subcutaneous  Antimicrobials: Anti-infectives (From admission, onward)   Start     Dose/Rate Route Frequency Ordered Stop   10/05/18 1200  vancomycin (VANCOCIN) IVPB 750 mg/150 ml premix     750 mg 150 mL/hr over 60 Minutes Intravenous Every M-W-F (Hemodialysis) 10/04/18 2049     10/04/18 2100  ceFEPIme (MAXIPIME) 1 g in sodium chloride 0.9 % 100 mL IVPB     1 g 200 mL/hr over 30 Minutes Intravenous Every 24 hours 10/04/18 2039     10/04/18 2100  vancomycin (VANCOCIN) 1,750 mg in sodium chloride 0.9 % 500 mL IVPB     1,750 mg 250 mL/hr over 120 Minutes Intravenous  Once 10/04/18 2047 10/05/18 0137      Cultures:  12/10 x 1 blood culture, no growth, 12/8 x 2 blood cultures no growth to date  Telemetry: None  DVT prophylaxis: Heparin prophylaxis   Objective: Vitals:   10/10/18 0856 10/10/18 1306 10/10/18 1355 10/10/18 1546  BP:   109/64   Pulse: 91 (!) 108 (!) 111   Resp: 20 18 20    Temp:   97.8 F (36.6 C)   TempSrc:   Oral   SpO2: 97% 95% 98% 96%  Weight:      Height:        Intake/Output Summary (Last 24 hours) at 10/10/2018 1615 Last data filed at 10/10/2018  0520 Gross per 24 hour  Intake 809.9 ml  Output -  Net 809.9 ml   Filed Weights   10/08/18 0024 10/09/18 0700 10/09/18 1106  Weight: 76.9 kg 77.6 kg 75.9 kg    Exam:  Constitutional: Normal appearing male, lying in bed, no distress Eyes:  diffuse redness of entire right sclera and significant proptosis , ocular movement still intact, able to pass finger exam ENMT: No sinus tenderness, Oropharynx with moist mucous membranes Cardiovascular:  no peripheral edema Respiratory: Intermittent wheezing (much improved from prior exams),  no accessory muscle use, on 2 L [place] Abdomen: Soft,non-tender, normal bowel sounds Skin: No rash ulcers, or lesions. Without skin tenting  Neurologic: Grossly no focal neuro deficit. Psychiatric:Appropriate affect, and mood.   Data Reviewed: CBC: Recent Labs  Lab 10/04/18 1808  10/05/18 0204 10/06/18 0154 10/07/18 0148 10/09/18 0709 10/10/18 0812  WBC 18.5*   < > 16.4* 13.5* 10.9* 11.8* 11.5*  NEUTROABS 14.5*  --  12.3* 10.0*  --   --   --   HGB 9.2*   < > 8.5* 8.7* 8.5* 8.7* 9.7*  HCT 30.6*   < > 28.2* 28.0* 27.5* 27.8* 31.6*  MCV 99.7   < > 98.6 97.6 97.2 98.9 98.1  PLT 332   < > 313 283 253 242 289   < > = values in this interval not displayed.   Basic Metabolic Panel: Recent Labs  Lab 10/05/18 0204 10/06/18 0154 10/07/18 0148  10/08/18 0142 10/09/18 0308 10/10/18 0812  NA 141 139 138 138 138 138  K 5.0 4.1 4.4 3.9 4.5 4.7  CL 102 97* 95* 96* 96* 94*  CO2 21* 27 26 26 22 22   GLUCOSE 94 105* 124* 105* 116* 142*  BUN 106* 42* 68* 29* 68* 59*  CREATININE 15.98* 8.46* 11.73* 6.54* 10.15* 7.56*  CALCIUM 8.9 8.6* 9.1 8.5* 9.0 9.8  MG 2.4 2.2 2.4 2.1 2.5*  --    GFR: Estimated Creatinine Clearance: 11.4 mL/min (A) (by C-G formula based on SCr of 7.56 mg/dL (H)). Liver Function Tests: Recent Labs  Lab 10/04/18 1808  AST 17  ALT 30  ALKPHOS 51  BILITOT 1.0  PROT 7.7  ALBUMIN 2.6*   No results for input(s): LIPASE, AMYLASE in the  last 168 hours. No results for input(s): AMMONIA in the last 168 hours. Coagulation Profile: Recent Labs  Lab 10/06/18 0154  INR 1.33   Cardiac Enzymes: No results for input(s): CKTOTAL, CKMB, CKMBINDEX, TROPONINI in the last 168 hours. BNP (last 3 results) No results for input(s): PROBNP in the last 8760 hours. HbA1C: No results for input(s): HGBA1C in the last 72 hours. CBG: Recent Labs  Lab 10/09/18 1153 10/09/18 1649 10/09/18 2051 10/10/18 0811 10/10/18 1214  GLUCAP 104* 98 191* 160* 152*   Lipid Profile: No results for input(s): CHOL, HDL, LDLCALC, TRIG, CHOLHDL, LDLDIRECT in the last 72 hours. Thyroid Function Tests: No results for input(s): TSH, T4TOTAL, FREET4, T3FREE, THYROIDAB in the last 72 hours. Anemia Panel: No results for input(s): VITAMINB12, FOLATE, FERRITIN, TIBC, IRON, RETICCTPCT in the last 72 hours. Urine analysis:    Component Value Date/Time   COLORURINE RED (A) 07/06/2016 1938   APPEARANCEUR TURBID (A) 07/06/2016 1938   LABSPEC 1.020 07/06/2016 1938   PHURINE 7.5 07/06/2016 1938   GLUCOSEU NEGATIVE 07/06/2016 1938   HGBUR LARGE (A) 07/06/2016 1938   BILIRUBINUR NEGATIVE 07/06/2016 1938   KETONESUR NEGATIVE 07/06/2016 1938   PROTEINUR >300 (A) 07/06/2016 1938   UROBILINOGEN 0.2 11/04/2014 0952   NITRITE NEGATIVE 07/06/2016 1938   LEUKOCYTESUR SMALL (A) 07/06/2016 1938   Sepsis Labs: @LABRCNTIP (procalcitonin:4,lacticidven:4)  ) Recent Results (from the past 240 hour(s))  Culture, blood (routine x 2)     Status: None   Collection Time: 10/04/18  8:09 PM  Result Value Ref Range Status   Specimen Description BLOOD LEFT ARM  Final   Special Requests   Final    BOTTLES DRAWN AEROBIC AND ANAEROBIC Blood Culture adequate volume   Culture   Final    NO GROWTH 5 DAYS Performed at [facility], [place] [address], [place], [place] [address]    Report Status 10/09/2018 FINAL  Final  Culture, blood (routine x 2)     Status: None   Collection  Time: 10/04/18  8:50 PM  Result Value Ref Range Status   Specimen Description BLOOD LEFT FOREARM  Final   Special Requests   Final    BOTTLES DRAWN AEROBIC AND ANAEROBIC Blood Culture adequate volume   Culture   Final    NO GROWTH 5 DAYS Performed at [facility], [place] [address], [place], [place] [address]    Report Status 10/09/2018 FINAL  Final  Culture, blood (Routine X 2) w Reflex to ID Panel     Status: None (Preliminary result)   Collection Time: 10/06/18  4:30 PM  Result Value Ref Range Status   Specimen Description BLOOD LEFT HAND  Final  Special Requests   Final    BOTTLES DRAWN AEROBIC ONLY Blood Culture results may not be optimal due to an inadequate volume of blood received in culture bottles   Culture   Final    NO GROWTH 4 DAYS Performed at [facility], [place] [address], [place], [place] [address]    Report Status PENDING  Incomplete  Expectorated sputum assessment w rflx to resp cult     Status: None   Collection Time: 10/08/18  8:16 PM  Result Value Ref Range Status   Specimen Description EXPECTORATED SPUTUM  Final   Special Requests Normal  Final   Sputum evaluation   Final    THIS SPECIMEN IS ACCEPTABLE FOR SPUTUM CULTURE Performed at [facility], [place] [address], [place], [place] [address]    Report Status 10/09/2018 FINAL  Final  Culture, respiratory     Status: None (Preliminary result)   Collection Time: 10/08/18  8:16 PM  Result Value Ref Range Status   Specimen Description EXPECTORATED SPUTUM  Final   Special Requests Normal Reflexed from [id number]  Final   Gram Stain   Final    ABUNDANT WBC PRESENT,BOTH PMN AND MONONUCLEAR RARE GRAM POSITIVE COCCI RARE GRAM VARIABLE ROD    Culture   Final    FEW Consistent with normal respiratory flora. Performed at [facility], [place] [address], [place], [place] [address]    Report Status PENDING  Incomplete  MRSA PCR Screening     Status: None   Collection Time: 10/09/18  1:37 PM  Result  Value Ref Range Status   MRSA by PCR NEGATIVE NEGATIVE Final    Comment:        The GeneXpert MRSA Assay (FDA approved for NASAL specimens only), is one component of a comprehensive MRSA colonization surveillance program. It is not intended to diagnose MRSA infection nor to guide or monitor treatment for MRSA infections. Performed at [facility], [place] [address], [place],  [address]       Studies: Dg Chest 2 View  Result Date: 10/10/2018 CLINICAL DATA:  Cough. Acute respiratory failure. Diabetes. EXAM: CHEST - 2 VIEW COMPARISON:  10/07/2018. FINDINGS: Single lead AICD remains in good position. Enlarged cardiac silhouette, but improved from priors. Improved lung volumes, with chronic volume loss most prominent at the RIGHT base with scarring. No pneumothorax or significant effusion. IMPRESSION: No active cardiopulmonary disease. Improved aeration compared with most recent priors. Electronically Signed   By: Staci Righter M.D.   On: 10/10/2018 09:33    Scheduled Meds: . artificial tears   Right Eye Q6H  . calcitRIOL  1 mcg Oral Q M,W,F-HD  . calcium acetate  1,334 mg Oral TID WC  . Chlorhexidine Gluconate Cloth  6 each Topical Q0600  . feeding supplement (PRO-STAT SUGAR FREE 64)  30 mL Oral BID  . gabapentin  300 mg Oral BID  . heparin injection (subcutaneous)  5,000 Units Subcutaneous Q8H  . insulin aspart  0-5 Units Subcutaneous QHS  . insulin aspart  0-9 Units Subcutaneous TID WC  . ipratropium-albuterol  3 mL Nebulization QID  . methylPREDNISolone (SOLU-MEDROL) injection  125 mg Intravenous Q6H  . risperiDONE  0.25 mg Oral QHS  . traZODone  100 mg Oral QHS    Continuous Infusions: . ceFEPime (MAXIPIME) IV 1 g (10/09/18 2027)  . vancomycin Stopped (10/09/18 1051)     LOS: 6 days     Desiree Hane, MD [facility] Pager  [phone]  If 7PM-7AM, please contact night-coverage [email or web address] Password [email or web address] 10/10/2018, 4:15 PM

## 2018-10-10 NOTE — Progress Notes (Signed)
ENT Progress Note: HD# 6   Subjective: Tol po, cough stable Eye pain/swelling stable  Objective: Vital signs in last 24 hours: Temp:  [97.4 F (36.3 C)-98.5 F (36.9 C)] 97.5 F (36.4 C) (12/14 0807) Pulse Rate:  [86-113] 91 (12/14 0856) Resp:  [17-22] 20 (12/14 0856) BP: (79-133)/(52-95) 133/95 (12/14 0807) SpO2:  [94 %-99 %] 97 % (12/14 0856) Weight:  [75.9 kg] 75.9 kg (12/13 1106) Weight change: -1.7 kg Last BM Date: 10/08/18  Intake/Output from previous day: 12/13 0701 - 12/14 0700 In: 809.9 [P.O.:420; IV Piggyback:389.9] Out: 1712  Intake/Output this shift: No intake/output data recorded.  Labs: Recent Labs    10/09/18 0709  WBC 11.8*  HGB 8.7*  HCT 27.8*  PLT 242   Recent Labs    10/08/18 0142 10/09/18 0308  NA 138 138  K 3.9 4.5  CL 96* 96*  CO2 26 22  GLUCOSE 105* 116*  BUN 29* 68*  CALCIUM 8.5* 9.0    Studies/Results: Dg Chest 2 View  Result Date: 10/10/2018 CLINICAL DATA:  Cough. Acute respiratory failure. Diabetes. EXAM: CHEST - 2 VIEW COMPARISON:  10/07/2018. FINDINGS: Single lead AICD remains in good position. Enlarged cardiac silhouette, but improved from priors. Improved lung volumes, with chronic volume loss most prominent at the RIGHT base with scarring. No pneumothorax or significant effusion. IMPRESSION: No active cardiopulmonary disease. Improved aeration compared with most recent priors. Electronically Signed   By: Staci Righter M.D.   On: 10/10/2018 09:33   Ct Head Wo Contrast  Result Date: 10/08/2018 CLINICAL DATA:  Orbital asymmetry/proptosis. History of Wegner's granulomatosis. End-stage renal disease EXAM: CT HEAD WITHOUT CONTRAST TECHNIQUE: Contiguous axial images were obtained from the base of the skull through the vertex without intravenous contrast. COMPARISON:  None. FINDINGS: Brain: No evidence of acute infarction, hemorrhage, hydrocephalus, extra-axial collection or mass lesion/mass effect. Vascular: No hyperdense vessel  or unexpected calcification. Skull: Normal. Negative for fracture or focal lesion. Sinuses/Orbits: There is sinusitis with lobulated patchy mucosal thickening in the right maxillary, bilateral anterior ethmoid, and left frontal sinus. There are areas of bone loss along the anterior and low right lamina papyracea and at the right uncinate process. These defects are more likely inflammatory than postsurgical in this setting. Polypoid mucosal thickening seen in the nasal cavity. There is a contiguous soft tissue mass that is fairly dense in the inferior and medial right orbit measuring up to 31 x 25 mm with mass effect on the globe and medial/inferior recti. This orbital mass is nonspecific by imaging (and could be pseudotumor or lymphoma), but in this clinical setting is most consistent with orbital granuloma from the patient's Wegner's. Complete opacification of the left mastoid and middle ear spaces with negative nasopharynx. IMPRESSION: 1. 3 cm right orbital mass with significant proptosis. 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 The mass is contiguous with active sinus disease via a defect in the medial lower orbit. 2. Left mastoid and middle ear opacification with negative nasopharynx. 3. Normal intracranial imaging. Electronically Signed   By: Monte Fantasia M.D.   On: 10/08/2018 10:35   Ct Maxillofacial Wo Contrast  Result Date: 10/09/2018 CLINICAL DATA:  52 y/o M; history of Wegener's disease. Right orbit mass with proptosis. EXAM: CT MAXILLOFACIAL WITHOUT CONTRAST TECHNIQUE: Multidetector CT imaging of the maxillofacial structures was performed. Multiplanar CT image reconstructions were also generated. COMPARISON:  None. FINDINGS: Osseous: No acute fracture or mandibular dislocation.  Dental disease with multiple dental caries, absent crowns, and periapical cysts. Orbits: 18 mm dehiscence of  the right orbit inferomedial wall (series 7, image 28). Mass within the right orbit involving the medial and inferior extraconal compartments and the inferior aspect of the intraconal compartment spanning 3.4 x 2.6 x 2.2 cm (AP x ML x CC series 3, image 65 and series 7, image 32). The mass envelops the medial and inferior rectus muscles as well as abuts the optic nerve complex which is displaced superiorly. Associated proptosis of the right globe. No mass or inflammatory process of the left orbit. Sinuses: Diffuse paranasal sinus disease greatest in the right maxillary sinus with there is moderate mucosal thickening and within the opacified left frontal sinus. Left-greater-than-right mastoid effusions. Soft tissues: No mass or inflammatory process of the deep facial compartments. No lymphadenopathy of the face. Limited intracranial: No significant or unexpected finding. IMPRESSION: 1. Nonspecific right orbital mass measuring up to 3.4 cm as described above with proptosis. Findings likely represent an orbital granuloma due to granulomatosis with polyangiitis. Bony dehiscence of the medial wall of the right orbit is also likely associated. Broad differential including but not limited to pseudotumor, lymphoma, and metastasis. 2. Extensive paranasal sinus disease greatest in left frontal and right maxillary sinuses. 3. Left-greater-than-right mastoid effusions. Electronically Signed   By: Kristine Garbe M.D.   On: 10/09/2018 00:25     PHYSICAL EXAM: Cont proptosis and induration OD, pt reports no acute visual changes Bloody crusting in nasal passage   Assessment/Plan: Sinus disease chronic and stable.  9-month history of proptosis and induration in the right eye with CT findings consistent with retro-bulbar soft tissue mass, most likely represents inflammatory process rather than extension from sinus disease.  Once final pathology from needle biopsy of chest mass is complete further treatment  including high-dose steroids may be appropriate for lung and orbital concerns.  Patient may require biopsy of the intraorbital mass by ophthalmology if he continues to have ongoing symptoms.  Monitor for acute changes, plan follow-up in our office as an outpatient.  Recommend nasal saline to reduce dryness or bleeding and crusting in the nasal passageway.    Jerrell Belfast 10/10/2018, 9:51 AM

## 2018-10-10 NOTE — Progress Notes (Signed)
[facility] Progress Note   Subjective:  Looking better  Objective Vitals:   10/10/18 0807 10/10/18 0856 10/10/18 1306 10/10/18 1355  BP: (!) 133/95   109/64  Pulse: (!) 109 91 (!) 108 (!) 111  Resp: 18 20 18 20   Temp: (!) 97.5 F (36.4 C)   97.8 F (36.6 C)  TempSrc: Oral   Oral  SpO2: 97% 97% 95% 98%  Weight:      Height:       Physical Exam Gen: looking better, less coughing and R eye not bulging out as much  Heart:RRR; 2/6 systolic murmur Lungs: scattered rhonchi, occ wheezes, improving Abdomen:Soft, non-tender Extremities:No LE edema Dialysis Access:AVF + thrill   Dialysis Orders: MWFat [facility] 4h 400/800 77.5kg 2/2 bath R AVF Hep 7600 - Mircera75 every 2 wks (looks like ordered after last admit, but not given) -Calcitriol53mcg PO q HD   Summary: H/o Biopsy-proven ANCA positive necrotizing glomerulonephritis treated with Cytoxan and steroids in 2010 in [place], admitted in 2014 here for alveolar hemorrhage, renal failure requiring Cytoxan and steroids.  [name]  Assessment/Plan: 1.HCAP (recurrent/unresolved): SP lung biopsy of small mass results c/w Wegener's. Has orbital "pseudotumor" known to occur also with Wegener's. Per pulm they doubt infection, RLL process prob also due to Wegener's per my conversation w/ CCM. Started on bolus steroids yesterday, looks better today.  Has had Cytoxan in the past.  If all agree no active infection would Rx w/ Rituxan 1gm x 1 prior to dc and repeat in 2 weeks.  2. ESRD on HD: HD MWF.  HD Monday 3.HTN/volume:BP ok, no edema on exam. Keeping same EDW. 4. Anemia ckd:Hgb 8.5, due for ESA will give on HD Monday.  5. Secondary hyperparathyroidism:Ca/Phos ok. Continue home meds. 6. Nutrition:Alb very low, continue Pro-stat BID  Kelly Splinter MD [employer] pgr [phone]   10/10/2018, 2:03 PM    Additional Objective Labs: Basic Metabolic  Panel: Recent Labs  Lab 10/08/18 0142 10/09/18 0308 10/10/18 0812  NA 138 138 138  K 3.9 4.5 4.7  CL 96* 96* 94*  CO2 26 22 22   GLUCOSE 105* 116* 142*  BUN 29* 68* 59*  CREATININE 6.54* 10.15* 7.56*  CALCIUM 8.5* 9.0 9.8   Liver Function Tests: Recent Labs  Lab 10/04/18 1808  AST 17  ALT 30  ALKPHOS 51  BILITOT 1.0  PROT 7.7  ALBUMIN 2.6*   CBC: Recent Labs  Lab 10/04/18 1808  10/05/18 0204 10/06/18 0154 10/07/18 0148 10/09/18 0709 10/10/18 0812  WBC 18.5*   < > 16.4* 13.5* 10.9* 11.8* 11.5*  NEUTROABS 14.5*  --  12.3* 10.0*  --   --   --   HGB 9.2*   < > 8.5* 8.7* 8.5* 8.7* 9.7*  HCT 30.6*   < > 28.2* 28.0* 27.5* 27.8* 31.6*  MCV 99.7   < > 98.6 97.6 97.2 98.9 98.1  PLT 332   < > 313 283 253 242 289   < > = values in this interval not displayed.   CBG: Recent Labs  Lab 10/09/18 1153 10/09/18 1649 10/09/18 2051 10/10/18 0811 10/10/18 1214  GLUCAP 104* 98 191* 160* 152*   Medications: . ceFEPime (MAXIPIME) IV 1 g (10/09/18 2027)  . vancomycin Stopped (10/09/18 1051)   . artificial tears   Right Eye Q6H  . calcitRIOL  1 mcg Oral Q M,W,F-HD  . calcium acetate  1,334 mg Oral TID WC  . Chlorhexidine  Gluconate Cloth  6 each Topical [id number]  . feeding supplement (PRO-STAT SUGAR FREE 64)  30 mL Oral BID  . gabapentin  300 mg Oral BID  . heparin injection (subcutaneous)  5,000 Units Subcutaneous Q8H  . insulin aspart  0-5 Units Subcutaneous QHS  . insulin aspart  0-9 Units Subcutaneous TID WC  . ipratropium-albuterol  3 mL Nebulization QID  . methylPREDNISolone (SOLU-MEDROL) injection  125 mg Intravenous Q6H  . risperiDONE  0.25 mg Oral QHS  . traZODone  100 mg Oral QHS

## 2018-10-11 DIAGNOSIS — N186 End stage renal disease: Secondary | ICD-10-CM

## 2018-10-11 DIAGNOSIS — Z95810 Presence of automatic (implantable) cardiac defibrillator: Secondary | ICD-10-CM

## 2018-10-11 LAB — CBC
HCT: 27.6 % — ABNORMAL LOW (ref 39.0–52.0)
Hemoglobin: 8.4 g/dL — ABNORMAL LOW (ref 13.0–17.0)
MCH: 29.4 pg (ref 26.0–34.0)
MCHC: 30.4 g/dL (ref 30.0–36.0)
MCV: 96.5 fL (ref 80.0–100.0)
Platelets: 286 10*3/uL (ref 150–400)
RBC: 2.86 MIL/uL — ABNORMAL LOW (ref 4.22–5.81)
RDW: 14.6 % (ref 11.5–15.5)
WBC: 17 10*3/uL — ABNORMAL HIGH (ref 4.0–10.5)
nRBC: 0 % (ref 0.0–0.2)

## 2018-10-11 LAB — CULTURE, RESPIRATORY W GRAM STAIN
Culture: NORMAL
Special Requests: NORMAL

## 2018-10-11 LAB — GLUCOSE, CAPILLARY
Glucose-Capillary: 157 mg/dL — ABNORMAL HIGH (ref 70–99)
Glucose-Capillary: 210 mg/dL — ABNORMAL HIGH (ref 70–99)

## 2018-10-11 MED ORDER — CHLORHEXIDINE GLUCONATE CLOTH 2 % EX PADS
6.0000 | MEDICATED_PAD | Freq: Every day | CUTANEOUS | Status: DC
  Administered 2018-10-12 – 2018-10-15 (×3): 6 via TOPICAL

## 2018-10-11 MED ORDER — SODIUM CHLORIDE 0.9 % IV SOLN
250.0000 mg | Freq: Four times a day (QID) | INTRAVENOUS | Status: DC
  Administered 2018-10-11 – 2018-10-14 (×10): 250 mg via INTRAVENOUS
  Filled 2018-10-11 (×15): qty 2

## 2018-10-11 MED ORDER — DARBEPOETIN ALFA 100 MCG/0.5ML IJ SOSY
100.0000 ug | PREFILLED_SYRINGE | INTRAMUSCULAR | Status: DC
  Administered 2018-10-12: 100 ug via INTRAVENOUS
  Filled 2018-10-11: qty 0.5

## 2018-10-11 NOTE — Progress Notes (Signed)
PROGRESS NOTE  James Nicholson EPP:[id number] DOB: 05/11/1966 DOA: 10/04/2018 PCP: [name], MD  HPI/Brief Narrative  James Nicholson is a 52 y.o. year old male of [place] descent with medical history significant for ESRD on HD, ANCA granulomatosis polyangiitis (diagnosed 2010), schizophrenia, history of VT arrest status post ICD (2010), history of latent TB (status post 9 months of isoniazid 04/2012) who presented on 10/04/2018 with worsening cough, dyspnea and blood-tinged sputum after recent hospitalization and discharge (12/1-12/02/2018) for presumed H CAP for which he empirically received IV ceftriaxone and azithromycin and was discharged on cefdinir and azithromycin to complete 7-day course.  Hospital course complicated by: found to have orbital mass on CT scan upon evaluation of left eye redness/proptosis. Concerning for granuloma related to Wegener's. Patient still has intact vision so recommended to continue artificial tears per ophthalmology.  Currently awaiting biopsy results from lung nodule to determine if to continue antibiotics or start immunosuppression.   Subjective Cough continuing to improve Denies any eye pain or changes in vision.    Assessment/Plan:  #Acute hypoxic respiratory failure related to bilateral basilar opacities secondary to active Wegener's flare, stable.  Currently on room air.  Tolerating Solu-Medrol pulse dosing, changed to 1 g/day for the next 48 hours per critical care, plan for rituximab on 12/18, needs plan for outpatient administration of rituximab weekly for 4 weeks.  Completed cefepime x7 days today.Supportive care with albuterol nebs, flutter valve, incentive spirometry.     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  Significant improvement in right eye redness, no changes in vision. Findings discussed with Dr. [name] [facility]) on 12/12 who recommended artificial tears and follow up with Dr. Manuella Ghazi [facility]). Given patient has no changes in vision these recs were given over phone, will need to re-consult if any changes in vision.  Discussed with ophthalmology on 12/13 getting formal eye exam for accurate baseline.  #Pansinusitis.  CT head shows lobulated areas of patchy mucosal thickening in right maxillary, bilateral anterior ethmoid and left frontal sinus and confirmed on dedicated sinus CT. ENT agrees with initiation of medical therapy as long as vision remains intact.  Will follow up as an outpatient, nasal saline to reduce dryness or bleeding and crusting in the nasal passageway  #Sepsis syndrome, present on admission, resolved. Presented with tachypnea, elevated white count and concern bilateral airspace disease initial imaging concerning for pneumonia with sepsis syndrome.  #Leukocytosis.  Previously resolving but now uptrending likely in the setting of IV steroids.  We will continue to monitor as patient remains afebrile.  #Left apical pulmonary nodule.   Status post CT-guided lung biopsy on 12/11 by IR, consistent with granulomatous disease as mentioned above  #ESRD on HD, Monday Wednesday Friday.  Appreciate nephrology following and managing dialysis sessions.  Continue PhosLo 3 times daily  #Type 2 diabetes.  A1c 4.8%.  Holding home Levemir, monitor CBGs on sliding scale insulin as needed low A1c do not suspect he will need to continue Levemir on discharge  #Schizophrenia, stable.  Continue home Risperdal and trazodone  #History of granulomatosis polyangiitis with renal disease.  Diagnosed in 2010 (previously treated with Cytoxan and steroids, last hospitalization for flare in 2014 with symptoms of epistasis, renal failure and alveolar hemorrhage renal CT scan) unclear if this is a current flare, currently treating empirically for infection, await results of biopsy.  #Peripheral neuropathy in lower extremities.  Continue home gabapentin  #Normocytic anemia, anemia  of chronic disease (ESRD).  Hemoglobin  stable at baseline.  Code Status: Full code  Family Communication: No family at bedside, will discuss with brother  Disposition Plan: Last day of IV cefepime on 12/15, now IV Solu-Medrol daily for the next 48 hours, plan for rituximab on 12/18, needs outpatient plan further administration.  Consultants:  Nephrology  ENT(12/12, Dr. Rosen)12/14 Dr. Jerrell [place]  Ophthalmology 12/12 (Dr. [name], [facility]),   Pulmonary (12/12--on)  Procedures:  Ct guided left upper lobe pulm nodule biopsy 12/11  Prophylaxis: Heparin subcutaneous  Antimicrobials: Anti-infectives (From admission, onward)   Start     Dose/Rate Route Frequency Ordered Stop   10/05/18 1200  vancomycin (VANCOCIN) IVPB 750 mg/150 ml premix  Status:  Discontinued     750 mg 150 mL/hr over 60 Minutes Intravenous Every M-W-F (Hemodialysis) 10/04/18 2049 10/10/18 1616   10/04/18 2100  ceFEPIme (MAXIPIME) 1 g in sodium chloride 0.9 % 100 mL IVPB  Status:  Discontinued     1 g 200 mL/hr over 30 Minutes Intravenous Every 24 hours 10/04/18 2039 10/11/18 0736   10/04/18 2100  vancomycin (VANCOCIN) 1,750 mg in sodium chloride 0.9 % 500 mL IVPB     1,750 mg 250 mL/hr over 120 Minutes Intravenous  Once 10/04/18 2047 10/05/18 0137      Cultures:  12/10 x 1 blood culture, no growth, 12/8 x 2 blood cultures no growth to date  Telemetry: None  DVT prophylaxis: Heparin prophylaxis   Objective: Vitals:   10/10/18 1546 10/10/18 2120 10/11/18 0459 10/11/18 1425  BP:  117/68 109/65 128/76  Pulse:  (!) 105 95 (!) 112  Resp:  20 15 17   Temp:  97.8 F (36.6 C) 97.8 F (36.6 C) 97.9 F (36.6 C)  TempSrc:  Oral Oral Oral  SpO2: 96% 98% 98% 97%  Weight:      Height:        Intake/Output Summary (Last 24 hours) at 10/11/2018 1831 Last data filed at 10/11/2018 1526 Gross per 24 hour  Intake 774 ml  Output -  Net 774 ml   Filed Weights   10/08/18 0024  10/09/18 0700 10/09/18 1106  Weight: 76.9 kg 77.6 kg 75.9 kg    Exam:  Constitutional: Normal appearing male, lying in bed, no distress Eyes: Improving redness in right eye, proptosis, swelling around orbit,EOMI, able to pass finger exam ENMT: No sinus tenderness, Oropharynx with moist mucous membranes Cardiovascular:  no peripheral edema Respiratory: Room air, clear breath sounds, normal respiratory effort Abdomen: Soft,non-tender, normal bowel sounds Skin: No rash ulcers, or lesions. Without skin tenting  Neurologic: Grossly no focal neuro deficit. Psychiatric:Appropriate affect, and mood.   Data Reviewed: CBC: Recent Labs  Lab 10/05/18 0204 10/06/18 0154 10/07/18 0148 10/09/18 0709 10/10/18 0812 10/11/18 0745  WBC 16.4* 13.5* 10.9* 11.8* 11.5* 17.0*  NEUTROABS 12.3* 10.0*  --   --   --   --   HGB 8.5* 8.7* 8.5* 8.7* 9.7* 8.4*  HCT 28.2* 28.0* 27.5* 27.8* 31.6* 27.6*  MCV 98.6 97.6 97.2 98.9 98.1 96.5  PLT 313 283 253 242 289 573   Basic Metabolic Panel: Recent Labs  Lab 10/05/18 0204 10/06/18 0154 10/07/18 0148 10/08/18 0142 10/09/18 0308 10/10/18 0812  NA 141 139 138 138 138 138  K 5.0 4.1 4.4 3.9 4.5 4.7  CL 102 97* 95* 96* 96* 94*  CO2 21* 27 26 26 22 22   GLUCOSE 94 105* 124* 105* 116* 142*  BUN 106* 42* 68* 29* 68* 59*  CREATININE 15.98* 8.46* 11.73*  6.54* 10.15* 7.56*  CALCIUM 8.9 8.6* 9.1 8.5* 9.0 9.8  MG 2.4 2.2 2.4 2.1 2.5*  --    GFR: Estimated Creatinine Clearance: 11.4 mL/min (A) (by C-G formula based on SCr of 7.56 mg/dL (H)). Liver Function Tests: No results for input(s): AST, ALT, ALKPHOS, BILITOT, PROT, ALBUMIN in the last 168 hours. No results for input(s): LIPASE, AMYLASE in the last 168 hours. No results for input(s): AMMONIA in the last 168 hours. Coagulation Profile: Recent Labs  Lab 10/06/18 0154  INR 1.33   Cardiac Enzymes: No results for input(s): CKTOTAL, CKMB, CKMBINDEX, TROPONINI in the last 168 hours. BNP (last 3  results) No results for input(s): PROBNP in the last 8760 hours. HbA1C: No results for input(s): HGBA1C in the last 72 hours. CBG: Recent Labs  Lab 10/10/18 1214 10/10/18 1701 10/10/18 2121 10/11/18 0805 10/11/18 1209  GLUCAP 152* 146* 176* 157* 210*   Lipid Profile: No results for input(s): CHOL, HDL, LDLCALC, TRIG, CHOLHDL, LDLDIRECT in the last 72 hours. Thyroid Function Tests: No results for input(s): TSH, T4TOTAL, FREET4, T3FREE, THYROIDAB in the last 72 hours. Anemia Panel: No results for input(s): VITAMINB12, FOLATE, FERRITIN, TIBC, IRON, RETICCTPCT in the last 72 hours. Urine analysis:    Component Value Date/Time   COLORURINE RED (A) 07/06/2016 1938   APPEARANCEUR TURBID (A) 07/06/2016 1938   LABSPEC 1.020 07/06/2016 1938   PHURINE 7.5 07/06/2016 1938   GLUCOSEU NEGATIVE 07/06/2016 1938   HGBUR LARGE (A) 07/06/2016 1938   BILIRUBINUR NEGATIVE 07/06/2016 1938   KETONESUR NEGATIVE 07/06/2016 1938   PROTEINUR >300 (A) 07/06/2016 1938   UROBILINOGEN 0.2 11/04/2014 0952   NITRITE NEGATIVE 07/06/2016 1938   LEUKOCYTESUR SMALL (A) 07/06/2016 1938   Sepsis Labs: @LABRCNTIP (procalcitonin:4,lacticidven:4)  ) Recent Results (from the past 240 hour(s))  Culture, blood (routine x 2)     Status: None   Collection Time: 10/04/18  8:09 PM  Result Value Ref Range Status   Specimen Description BLOOD LEFT ARM  Final   Special Requests   Final    BOTTLES DRAWN AEROBIC AND ANAEROBIC Blood Culture adequate volume   Culture   Final    NO GROWTH 5 DAYS Performed at [facility], [place] [address], [place], [place] [address]    Report Status 10/09/2018 FINAL  Final  Culture, blood (routine x 2)     Status: None   Collection Time: 10/04/18  8:50 PM  Result Value Ref Range Status   Specimen Description BLOOD LEFT FOREARM  Final   Special Requests   Final    BOTTLES DRAWN AEROBIC AND ANAEROBIC Blood Culture adequate volume   Culture   Final    NO GROWTH 5 DAYS Performed at  [facility], [place] [address], [place], [place] [address]    Report Status 10/09/2018 FINAL  Final  Culture, blood (Routine X 2) w Reflex to ID Panel     Status: None (Preliminary result)   Collection Time: 10/06/18  4:30 PM  Result Value Ref Range Status   Specimen Description BLOOD LEFT HAND  Final   Special Requests   Final    BOTTLES DRAWN AEROBIC ONLY Blood Culture results may not be optimal due to an inadequate volume of blood received in culture bottles   Culture   Final    NO GROWTH 4 DAYS Performed at [facility], [place] [address], [place], [place] [address]    Report Status PENDING  Incomplete  Expectorated sputum assessment w rflx  to resp cult     Status: None   Collection Time: 10/08/18  8:16 PM  Result Value Ref Range Status   Specimen Description EXPECTORATED SPUTUM  Final   Special Requests Normal  Final   Sputum evaluation   Final    THIS SPECIMEN IS ACCEPTABLE FOR SPUTUM CULTURE Performed at [facility], [address], [place], [place] [address]    Report Status 10/09/2018 FINAL  Final  Culture, respiratory     Status: None   Collection Time: 10/08/18  8:16 PM  Result Value Ref Range Status   Specimen Description EXPECTORATED SPUTUM  Final   Special Requests Normal Reflexed from [id number]  Final   Gram Stain   Final    ABUNDANT WBC PRESENT,BOTH PMN AND MONONUCLEAR RARE GRAM POSITIVE COCCI RARE GRAM VARIABLE ROD    Culture   Final    FEW Consistent with normal respiratory flora. Performed at [facility], [place] [address] [place], [place] [address]    Report Status 10/11/2018 FINAL  Final  MRSA PCR Screening     Status: None   Collection Time: 10/09/18  1:37 PM  Result Value Ref Range Status   MRSA by PCR NEGATIVE NEGATIVE Final    Comment:        The GeneXpert MRSA Assay (FDA approved for NASAL specimens only), is one component of a comprehensive MRSA colonization surveillance program. It is not intended to diagnose MRSA infection  nor to guide or monitor treatment for MRSA infections. Performed at [facility], [place] [address] [place],  [address]       Studies: No results found.  Scheduled Meds: . artificial tears   Right Eye Q6H  . calcitRIOL  1 mcg Oral Q M,W,F-HD  . calcium acetate  1,334 mg Oral TID WC  . Chlorhexidine Gluconate Cloth  6 each Topical Q0600  . [START ON 10/12/2018] Chlorhexidine Gluconate Cloth  6 each Topical Q0600  . [START ON 10/12/2018] darbepoetin (ARANESP) injection - DIALYSIS  100 mcg Intravenous Q Mon-HD  . feeding supplement (PRO-STAT SUGAR FREE 64)  30 mL Oral BID  . gabapentin  300 mg Oral BID  . heparin injection (subcutaneous)  5,000 Units Subcutaneous Q8H  . insulin aspart  0-5 Units Subcutaneous QHS  . insulin aspart  0-9 Units Subcutaneous TID WC  . ipratropium-albuterol  3 mL Nebulization QID  . risperiDONE  0.25 mg Oral QHS  . sodium chloride  1 spray Each Nare Q0200  . traZODone  100 mg Oral QHS    Continuous Infusions: . methylPREDNISolone (SOLU-MEDROL) injection 250 mg (10/11/18 1526)     LOS: 7 days     Desiree Hane, MD [facility] Pager [phone]  If 7PM-7AM, please contact night-coverage [email or web address] Password [facility] 10/11/2018, 6:31 PM

## 2018-10-11 NOTE — Plan of Care (Signed)

## 2018-10-11 NOTE — Progress Notes (Addendum)
NAMEJacobi Nicholson, MRN:  [id number], DOB:  [birth year], LOS: 7 ADMISSION DATE:  10/04/2018, CONSULTATION DATE:  10/08/18 REFERRING MD:  Theodoro Grist MD, CHIEF COMPLAINT: Slow to resolve pneumonia.  Brief History   52 year old with history of Wegener's disease, renal failure.  Biopsy-proven ANCA positive necrotizing glomerulonephritis.Former smoker , quit 2010, no pack year history documented.  Recent admission from 12/1/5 to 10/01/2018 for pneumonia.  Readmitted on 12/12 for recurrence of symptoms. Underwent CT-guided biopsy of apical lung mass.  PCCM consulted 12/12 slow to resolve symptoms.  Past Medical History  History of atrial fibrillation, cardiomyopathy s/p AICD, ESRD, schizophrenia, latent TB status post INH therapy in 2014.  Biopsy-proven ANCA positive necrotizing glomerulonephritis treated with Cytoxan and steroids in 2010 in [place], admitted in 2014 for alveolar hemorrhage, renal failure requiring Cytoxan and steroids.  [name]  Significant Hospital Events   10/04/2018>> Hospital Admission  Consults:  12/2 Nephrology 12/10 IR  Procedures:  12/11- CT guided biopsy of apical mass> granulomatous inflammation with neutrophilic micro-abscesses  Significant Diagnostic Tests:  CT chest 10/04/2018- bilateral patchy airspace disease, 17 cm left apical nodule, aortic atherosclerosis. I have reviewed the images personally  Head CT 12/12-pending  Micro Data:  Blood cx 12/8- No growth Blood cx 12/10- No growth 12/12 Respiratory>> GS>> Rare G + cocci, rare G variable rod>>  Antimicrobials:  Vanco 12/9>> Cefepime 12/8 >>  Interim history/subjective:  Breathing is better  Objective   Blood pressure 109/65, pulse 95, temperature 97.8 F (36.6 C), temperature source Oral, resp. rate 15, height '5\' 9"'$  (1.753 m), weight 75.9 kg, SpO2 98 %.        Intake/Output Summary (Last 24 hours) at 10/11/2018 1347 Last data filed at 10/11/2018  0600 Gross per 24 hour  Intake 640 ml  Output -  Net 640 ml   Filed Weights   10/08/18 0024 10/09/18 0700 10/09/18 1106  Weight: 76.9 kg 77.6 kg 75.9 kg    Examination:  General:  Resting comfortably in bed HENT: NCAT proptosis/swelling around R eye PULM: CTA B, normal effort CV: RRR, no mgr GI: BS+, soft, nontender Derm: fistula noted R arm Neuro: awake, alert, no distress, MAEW   Resolved Hospital Problem list     Assessment & Plan:  52 year old with history of small vessel vasculitis admitted with a flare of the same causing a pneumonia syndrome and severe swelling in sinuses.  Lung nodule is due to wegeners, not organ threatening right now.  Given organ threatening disease (R eye), would favor aggressive management:  -change Solumedrol 1gm/day for the next 48 hours ('250mg'$  IV q6h) -on 12/18 would give Rituximab '375mg'$ /m2 -needs plan for outpatient administration of Rituximab weekly for 4 weeks per RAVE trial   Rituximab versus cyclophosphamide for ANCA-associated vasculitis.  [employer], Merkel PA, Spiera R, Rudean Curt, Orick CA, [place] GS, [place] CG, [address] [place], Turkiewicz A, Tchao NK, Doristine Counter, Sejismundo LP, Mieras K, Weitzenkamp D, [place] D, [place] V, [place], [place], [place], [place] FC, Geetha D, [place], [place], [place], Peikert T, Stegeman C, Ytterberg SR, Specks U, RAVE-[employer]N Research Group  N Engl J Med. 2010;363(3):221.     Labs   CBC: Recent Labs  Lab 10/04/18 1808  10/05/18 3086 10/06/18 0154 10/07/18 0148 10/09/18 0709 10/10/18 0812 10/11/18 0745  WBC 18.5*   < > 16.4* 13.5* 10.9* 11.8* 11.5* 17.0*  NEUTROABS 14.5*  --  12.3* 10.0*  --   --   --   --  HGB 9.2*   < > 8.5* 8.7* 8.5* 8.7* 9.7* 8.4*  HCT 30.6*   < > 28.2* 28.0* 27.5* 27.8* 31.6* 27.6*  MCV 99.7   < > 98.6 97.6 97.2 98.9 98.1 96.5  PLT 332   < > 313 283 253 242 289 286   < > = values in this interval not displayed.    Basic Metabolic  Panel: Recent Labs  Lab 10/05/18 0204 10/06/18 0154 10/07/18 0148 10/08/18 0142 10/09/18 0308 10/10/18 0812  NA 141 139 138 138 138 138  K 5.0 4.1 4.4 3.9 4.5 4.7  CL 102 97* 95* 96* 96* 94*  CO2 21* '27 26 26 22 22  '$ GLUCOSE 94 105* 124* 105* 116* 142*  BUN 106* 42* 68* 29* 68* 59*  CREATININE 15.98* 8.46* 11.73* 6.54* 10.15* 7.56*  CALCIUM 8.9 8.6* 9.1 8.5* 9.0 9.8  MG 2.4 2.2 2.4 2.1 2.5*  --    GFR: Estimated Creatinine Clearance: 11.4 mL/min (A) (by C-G formula based on SCr of 7.56 mg/dL (H)). Recent Labs  Lab 10/04/18 1816  10/07/18 0148 10/09/18 0709 10/10/18 0812 10/11/18 0745  WBC  --    < > 10.9* 11.8* 11.5* 17.0*  LATICACIDVEN 0.97  --   --   --   --   --    < > = values in this interval not displayed.    Liver Function Tests: Recent Labs  Lab 10/04/18 1808  AST 17  ALT 30  ALKPHOS 51  BILITOT 1.0  PROT 7.7  ALBUMIN 2.6*   No results for input(s): LIPASE, AMYLASE in the last 168 hours. No results for input(s): AMMONIA in the last 168 hours.  ABG    Component Value Date/Time   PHART 7.289 (L) 02/05/2013 0146   PCO2ART 29.0 (L) 02/05/2013 0146   PO2ART 81.0 02/05/2013 0146   HCO3 13.9 (L) 02/05/2013 0146   TCO2 29 07/07/2016 0843   ACIDBASEDEF 12.0 (H) 02/05/2013 0146   O2SAT 95.0 02/05/2013 0146     Coagulation Profile: Recent Labs  Lab 10/06/18 0154  INR 1.33    Cardiac Enzymes: No results for input(s): CKTOTAL, CKMB, CKMBINDEX, TROPONINI in the last 168 hours.  HbA1C: Hgb A1c MFr Bld  Date/Time Value Ref Range Status  10/04/2018 09:34 PM 4.8 4.8 - 5.6 % Final    Comment:    (NOTE) Pre diabetes:          5.7%-6.4% Diabetes:              >6.4% Glycemic control for   <7.0% adults with diabetes   12/21/2013 08:10 PM 4.5 <5.7 % Final    Comment:    (NOTE)                                                                       According to the ADA Clinical Practice Recommendations for 2011, when HbA1c is used as a screening  test:  >=6.5%   Diagnostic of Diabetes Mellitus           (if abnormal result is confirmed) 5.7-6.4%   Increased risk of developing Diabetes Mellitus References:Diagnosis and Classification of Diabetes Mellitus,Diabetes [id number],7622,63(FHLKT 1):S62-S69 and Standards of Medical Care in  Diabetes - 2011,Diabetes Care,2011,34 (Suppl 1):S11-S61.    CBG: Recent Labs  Lab 10/10/18 1214 10/10/18 1701 10/10/18 2121 10/11/18 0805 10/11/18 1209  GLUCAP 152* 146* 176* 157* 210*     Past Medical History  He,  has a past medical history of Anemia, Atrial fibrillation, Cardiomyopathy ([place]) (2010), Cellulitis, Chronic kidney disease (CKD), stage V ([place]), Dyslipidemia, Enterobacter sepsis ([place]), Hyperlipidemia, Hypertension, IDDM (insulin dependent diabetes mellitus) ([place]) (11/03/2014), S/P ICD (internal cardiac defibrillator) procedure (2010), Schizophrenia ([place]), and Wegener's disease, pulmonary ([place]) (02/05/2013).   Surgical History    Past Surgical History:  Procedure Laterality Date  . AV FISTULA PLACEMENT Right 02/10/2013   Procedure: ARTERIOVENOUS (AV) FISTULA CREATION;  Surgeon: Rosetta Posner, MD;  [facility];  Service: Vascular;  Laterality: Right;  Right forearm radial/cephalic arterovenous fistula.   Marland Kitchen CARDIAC CATHETERIZATION  2010   [place]: In setting of VT arrest. Per brother's report, nonobstructive with no intervention  . CARDIAC DEFIBRILLATOR PLACEMENT  2010   [place]  . FISTULOGRAM Right 08/09/2013   Procedure: FISTULOGRAM;  Surgeon: Angelia Mould, MD;  Location: [facility] CATH LAB;  Service: Cardiovascular;  Laterality: Right;  . ICD GENERATOR CHANGEOUT N/A 12/05/2017   Procedure: ICD GENERATOR CHANGEOUT;  Surgeon: Evans Lance, MD;  Location: [facility] CV LAB;  Service: Cardiovascular;  Laterality: N/A;  . LIGATION OF COMPETING BRANCHES OF ARTERIOVENOUS FISTULA Right 08/13/2013   Procedure: LIGATION OF COMPETING BRANCHES X5 OF ARTERIOVENOUS FISTULA- RIGHT ARM;   Surgeon: Serafina Mitchell, MD;  Location: [facility] OR;  Service: Vascular;  Laterality: Right;     Social History   reports that he quit smoking about 9 years ago. He has never used smokeless tobacco. He reports that he does not drink alcohol or use drugs.   Family History   His family history includes CAD in his mother.   Allergies No Known Allergies   Home Medications  Prior to Admission medications   Medication Sig Start Date End Date Taking? Authorizing Provider  calcium acetate (PHOSLO) 667 MG capsule Take 1,334 mg by mouth 3 (three) times daily with meals. 8a, 12p, 5p 02/11/13  Yes Regalado, Belkys A, MD  gabapentin (NEURONTIN) 300 MG capsule Take 300 mg by mouth 2 (two) times daily.   Yes [provider]  gemfibrozil (LOPID) 600 MG tablet Take 600 mg by mouth 2 (two) times daily before a meal.   Yes [provider]  insulin detemir (LEVEMIR) 100 UNIT/ML injection Inject 0.05 mLs (5 Units total) into the skin at bedtime. 02/11/13  Yes Regalado, Belkys A, MD  metoprolol succinate (TOPROL-XL) 50 MG 24 hr tablet Take 50 mg by mouth daily. Take with or immediately following a meal.    Yes [provider]  omeprazole (PRILOSEC) 20 MG capsule Take 20 mg by mouth every morning.    Yes [provider]  risperiDONE (RISPERDAL) 0.25 MG tablet Take 0.25 mg by mouth at bedtime.   Yes [provider]  traZODone (DESYREL) 100 MG tablet Take 100 mg by mouth at bedtime.   Yes [provider]     Met with patient, discussed situation with my partners in pulmonary medicine and discussed with Dr. Teryl Lucy.  Total time spent reviewing situation and medical literature forming plan 40 minutes  Roselie Awkward, MD [place] Pager: [phone] Cell: [phone] If no response, call [phone]  10/11/2018, 1:47 PM

## 2018-10-11 NOTE — Progress Notes (Signed)
[name] Progress Note   Subjective:  Looking better again today, up in chair, coughing resolved  Objective Vitals:   10/10/18 1546 10/10/18 2120 10/11/18 0459 10/11/18 1425  BP:  117/68 109/65 128/76  Pulse:  (!) 105 95 (!) 112  Resp:  20 15 17   Temp:  97.8 F (36.6 C) 97.8 F (36.6 C) 97.9 F (36.6 C)  TempSrc:  Oral Oral Oral  SpO2: 96% 98% 98% 97%  Weight:      Height:       Physical Exam Gen: looking better, R eye protusion and redness mostly resolved, coughing gone Heart:RRR; 2/6 systolic murmur Lungs: clear bilat Abdomen:Soft, non-tender Extremities:No LE edema Dialysis Access:AVF + thrill   Dialysis Orders: MWFat [facility] 4h 400/800 77.5kg 2/2 bath R AVF Hep 7600 - Mircera75 every 2 wks (looks like ordered after last admit, but not given) -Calcitriol25mcg PO q HD   Summary: H/o Biopsy-proven ANCA positive necrotizing glomerulonephritis treated with Cytoxan and steroids in 2010 in [place], admitted in 2014 here for alveolar hemorrhage, renal failure requiring Cytoxan and steroids.  [name]  Assessment/Plan:  1.HCAP (recurrent/unresolved): SP lung biopsy of small mass results c/w Wegener's. Has RLL opacity.  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 Started on IV steroids and looks much better (coughing and R eye better). CCM is following, defer further immunosuppression to them.   2. ESRD on HD: HD MWF.  HD Monday.   3.HTN/volume:BP ok, no edema on exam, min UF tomorrow  4. Anemia ckd:Hgb 8.5, due for ESA 100 ug will give on HD Monday .   5. Secondary hyperparathyroidism:Ca/Phos ok. Continue home meds.  6. Nutrition:Alb very low, continue Pro-stat BID  James Splinter MD James Nicholson pgr [phone]   10/11/2018, 4:25 PM    Additional Objective Labs: Basic Metabolic Panel: Recent Labs  Lab 10/08/18 0142 10/09/18 0308 10/10/18 0812  NA 138  138 138  K 3.9 4.5 4.7  CL 96* 96* 94*  CO2 26 22 22   GLUCOSE 105* 116* 142*  BUN 29* 68* 59*  CREATININE 6.54* 10.15* 7.56*  CALCIUM 8.5* 9.0 9.8   Liver Function Tests: Recent Labs  Lab 10/04/18 1808  AST 17  ALT 30  ALKPHOS 51  BILITOT 1.0  PROT 7.7  ALBUMIN 2.6*   CBC: Recent Labs  Lab 10/04/18 1808  10/05/18 0204 10/06/18 0154 10/07/18 0148 10/09/18 0709 10/10/18 0812 10/11/18 0745  WBC 18.5*   < > 16.4* 13.5* 10.9* 11.8* 11.5* 17.0*  NEUTROABS 14.5*  --  12.3* 10.0*  --   --   --   --   HGB 9.2*   < > 8.5* 8.7* 8.5* 8.7* 9.7* 8.4*  HCT 30.6*   < > 28.2* 28.0* 27.5* 27.8* 31.6* 27.6*  MCV 99.7   < > 98.6 97.6 97.2 98.9 98.1 96.5  PLT 332   < > 313 283 253 242 289 286   < > = values in this interval not displayed.   CBG: Recent Labs  Lab 10/10/18 1214 10/10/18 1701 10/10/18 2121 10/11/18 0805 10/11/18 1209  GLUCAP 152* 146* 176* 157* 210*   Medications: . methylPREDNISolone (SOLU-MEDROL) injection 250 mg (10/11/18 1526)   . artificial tears   Right Eye Q6H  . calcitRIOL  1 mcg Oral Q M,W,F-HD  . calcium acetate  1,334 mg Oral TID WC  . Chlorhexidine Gluconate Cloth  6 each Topical Q0600  . feeding supplement (PRO-STAT SUGAR  FREE 64)  30 mL Oral BID  . gabapentin  300 mg Oral BID  . heparin injection (subcutaneous)  5,000 Units Subcutaneous Q8H  . insulin aspart  0-5 Units Subcutaneous QHS  . insulin aspart  0-9 Units Subcutaneous TID WC  . ipratropium-albuterol  3 mL Nebulization QID  . risperiDONE  0.25 mg Oral QHS  . sodium chloride  1 spray Each Nare Q0200  . traZODone  100 mg Oral QHS

## 2018-10-11 NOTE — Plan of Care (Signed)
  Problem: Pain Managment: Goal: General experience of comfort will improve Outcome: Progressing   Problem: Safety: Goal: Ability to remain free from injury will improve Outcome: Progressing   Problem: Skin Integrity: Goal: Risk for impaired skin integrity will decrease Outcome: Progressing   

## 2018-10-12 DIAGNOSIS — Z79899 Other long term (current) drug therapy: Secondary | ICD-10-CM

## 2018-10-12 DIAGNOSIS — M3130 Wegener's granulomatosis without renal involvement: Secondary | ICD-10-CM

## 2018-10-12 LAB — CBC
HCT: 25.2 % — ABNORMAL LOW (ref 39.0–52.0)
Hemoglobin: 7.9 g/dL — ABNORMAL LOW (ref 13.0–17.0)
MCH: 30.2 pg (ref 26.0–34.0)
MCHC: 31.3 g/dL (ref 30.0–36.0)
MCV: 96.2 fL (ref 80.0–100.0)
Platelets: 270 10*3/uL (ref 150–400)
RBC: 2.62 MIL/uL — ABNORMAL LOW (ref 4.22–5.81)
RDW: 14.8 % (ref 11.5–15.5)
WBC: 14 10*3/uL — ABNORMAL HIGH (ref 4.0–10.5)
nRBC: 0 % (ref 0.0–0.2)

## 2018-10-12 LAB — GLUCOSE, CAPILLARY
Glucose-Capillary: 238 mg/dL — ABNORMAL HIGH (ref 70–99)
Glucose-Capillary: 235 mg/dL — ABNORMAL HIGH (ref 70–99)
Glucose-Capillary: 225 mg/dL — ABNORMAL HIGH (ref 70–99)
Glucose-Capillary: 146 mg/dL — ABNORMAL HIGH (ref 70–99)
Glucose-Capillary: 214 mg/dL — ABNORMAL HIGH (ref 70–99)
Glucose-Capillary: 220 mg/dL — ABNORMAL HIGH (ref 70–99)

## 2018-10-12 LAB — RENAL FUNCTION PANEL
Albumin: 2.3 g/dL — ABNORMAL LOW (ref 3.5–5.0)
Anion gap: 26 — ABNORMAL HIGH (ref 5–15)
BUN: 163 mg/dL — ABNORMAL HIGH (ref 6–20)
CO2: 18 mmol/L — ABNORMAL LOW (ref 22–32)
Calcium: 9.5 mg/dL (ref 8.9–10.3)
Chloride: 90 mmol/L — ABNORMAL LOW (ref 98–111)
Creatinine, Ser: 13.49 mg/dL — ABNORMAL HIGH (ref 0.61–1.24)
GFR calc Af Amer: 4 mL/min — ABNORMAL LOW (ref >60)
GFR calc non Af Amer: 4 mL/min — ABNORMAL LOW (ref >60)
Glucose, Bld: 222 mg/dL — ABNORMAL HIGH (ref 70–99)
Phosphorus: 9.5 mg/dL — ABNORMAL HIGH (ref 2.5–4.6)
Potassium: 4.9 mmol/L (ref 3.5–5.1)
Sodium: 134 mmol/L — ABNORMAL LOW (ref 135–145)

## 2018-10-12 LAB — HEPATITIS B SURFACE ANTIGEN: Hepatitis B Surface Ag: NEGATIVE

## 2018-10-12 MED ORDER — IPRATROPIUM-ALBUTEROL 0.5-2.5 (3) MG/3ML IN SOLN: 3.0000 mL | RESPIRATORY_TRACT | Status: DC | PRN

## 2018-10-12 MED ORDER — CALCITRIOL 0.5 MCG PO CAPS
ORAL_CAPSULE | ORAL | Status: AC
  Administered 2018-10-12: 1 ug via ORAL
  Filled 2018-10-12: qty 2

## 2018-10-12 MED ORDER — DARBEPOETIN ALFA 100 MCG/0.5ML IJ SOSY
PREFILLED_SYRINGE | INTRAMUSCULAR | Status: AC
  Administered 2018-10-12: 100 ug via INTRAVENOUS
  Filled 2018-10-12: qty 0.5

## 2018-10-12 MED ORDER — HEPARIN SODIUM (PORCINE) 1000 UNIT/ML DIALYSIS
3000.0000 [IU] | INTRAMUSCULAR | Status: DC | PRN
  Filled 2018-10-12: qty 3

## 2018-10-12 NOTE — Care Management (Signed)
Per notes : -on 12/18 would give Rituximab 375mg /m2 -needs plan for outpatient administration of Rituximab weekly for 4 weeks per RAVE trial    Home health does not administer Rituximab. Called Short stay medical  To see if patient can go there weekly , left voicemail , awaiting call back. Will need following MD to complete prescription part of referral form and sign. Referral form placed in shadow chart. Magdalen Spatz RN BSN [phone]

## 2018-10-12 NOTE — Plan of Care (Signed)
  Problem: Clinical Measurements: Goal: Ability to maintain clinical measurements within normal limits will improve Outcome: Progressing Goal: Diagnostic test results will improve Outcome: Progressing Goal: Respiratory complications will improve Outcome: Progressing   

## 2018-10-12 NOTE — Progress Notes (Signed)
PROGRESS NOTE  James Nicholson LGX:[id number] DOB: [birth year] DOA: 10/04/2018 PCP: [name], MD  HPI/Brief Narrative  James Nicholson is a 52 y.o. year old male of [place] descent with medical history significant for ESRD on HD, ANCA granulomatosis polyangiitis (diagnosed 2010), schizophrenia, history of VT arrest status post ICD (2010), history of latent TB (status post 9 months of isoniazid 04/2012) who presented on 10/04/2018 with worsening cough, dyspnea and blood-tinged sputum after recent hospitalization and discharge (12/1-12/02/2018) for presumed H CAP for which he empirically received IV ceftriaxone and azithromycin and was discharged on cefdinir and azithromycin to complete 7-day course.  Hospital course complicated by: found to have orbital mass on CT scan upon evaluation of left eye redness/proptosis. Concerning for granuloma related to Wegener's. Patient still has intact vision so recommended to continue artificial tears per ophthalmology.  Currently awaiting biopsy results from lung nodule to determine if to continue antibiotics or start immunosuppression.   Subjective Still having cough but reports much improved No fevers or chills No changes in vision or eye pain.    Assessment/Plan:  #Acute hypoxic respiratory failure related to bilateral basilar opacities secondary to active Wegener's flare, stable.  Currently on room air.  Tolerating Solu-Medrol pulse dosing will continue until 12/17 and then transition to right is on 60 mg daily per pulmonology consultation, ANCA proteinase 71.2, plan for rituximab on 12/17, case manager assisting with arranging outpatient Rituxan infusion with short stay at [facility].  Check G6PD, if normal start Bactrim or dapsone for PGP prophylaxis per pulmonology needs plan for outpatient administration of rituximab weekly for 4 weeks.  Completed cefepime x7 days today.Supportive care with albuterol nebs, flutter valve, incentive spirometry.       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  Significant improvement in right eye redness, no changes in vision. Findings discussed with Dr. [name] [facility]) on 12/12 who recommended artificial tears and follow up with Dr. Manuella Ghazi [facility]). Given patient has no changes in vision these recs were given over phone, will need to re-consult if any changes in vision.  Discussed with ophthalmology on 12/13 getting formal eye exam for accurate baseline.   #Pansinusitis.  CT head shows lobulated areas of patchy mucosal thickening in right maxillary, bilateral anterior ethmoid and left frontal sinus and confirmed on dedicated sinus CT. ENT agrees with initiation of medical therapy as long as vision remains intact.  Will follow up as an outpatient, nasal saline to reduce dryness or bleeding and crusting in the nasal passageway  #Sepsis syndrome, present on admission, resolved. Presented with tachypnea, elevated white count and concern bilateral airspace disease initial imaging concerning for pneumonia with sepsis syndrome.  #Leukocytosis.  Previously resolving but now uptrending likely in the setting of IV steroids.  We will continue to monitor as patient remains afebrile.  #Left apical pulmonary nodule.   Status post CT-guided lung biopsy on 12/11 by IR, consistent with granulomatous disease as mentioned above  #ESRD on HD, Monday Wednesday Friday.  Appreciate nephrology following and managing dialysis sessions.  Continue PhosLo 3 times daily  #Type 2 diabetes.  A1c 4.8%.  Holding home Levemir, monitor CBGs on sliding scale insulin as needed low A1c do not suspect he will need to continue Levemir on discharge  #Schizophrenia, stable.  Continue home Risperdal and trazodone  #History of granulomatosis polyangiitis with renal disease.  Diagnosed in 2010 (previously treated with Cytoxan and steroids, last hospitalization for flare in 2014 with symptoms of  epistasis,  renal failure and alveolar hemorrhage renal CT scan) unclear if this is a current flare, currently treating empirically for infection, await results of biopsy.  #Peripheral neuropathy in lower extremities.  Continue home gabapentin  #Normocytic anemia, anemia of chronic disease (ESRD).  Hemoglobin stable at baseline.  Code Status: Full code  Family Communication: No family at bedside, will discuss with brother  Disposition Plan: Last day of IV cefepime on 12/15, now IV Solu-Medrol daily for the next 48 hours, plan for rituximab on 12/17, arranging short stay at [facility] to continue Rituxan infusion as outpatient.  Consultants:  Nephrology  ENT(12/12, Dr. Rosen)12/14 Dr. Jerrell [place]  Ophthalmology 12/12 (Dr. [name], [facility]),   Pulmonary (12/12--on)  Procedures:  Ct guided left upper lobe pulm nodule biopsy 12/11-positive for necrotizing granulomatous inflammation with neutrophilic microabscesses  Prophylaxis: Heparin subcutaneous  Antimicrobials: Anti-infectives (From admission, onward)   Start     Dose/Rate Route Frequency Ordered Stop   10/05/18 1200  vancomycin (VANCOCIN) IVPB 750 mg/150 ml premix  Status:  Discontinued     750 mg 150 mL/hr over 60 Minutes Intravenous Every M-W-F (Hemodialysis) 10/04/18 2049 10/10/18 1616   10/04/18 2100  ceFEPIme (MAXIPIME) 1 g in sodium chloride 0.9 % 100 mL IVPB  Status:  Discontinued     1 g 200 mL/hr over 30 Minutes Intravenous Every 24 hours 10/04/18 2039 10/11/18 0736   10/04/18 2100  vancomycin (VANCOCIN) 1,750 mg in sodium chloride 0.9 % 500 mL IVPB     1,750 mg 250 mL/hr over 120 Minutes Intravenous  Once 10/04/18 2047 10/05/18 0137      Cultures:  12/10 x 1 blood culture, no growth, 12/8 x 2 blood cultures no growth to date  Telemetry: None  DVT prophylaxis: Heparin prophylaxis   Objective: Vitals:   10/12/18 1015 10/12/18 1045 10/12/18 1102 10/12/18 1535  BP: 99/65 114/65  114/67 104/64  Pulse: 77 82 86 92  Resp:   18   Temp:   97.9 F (36.6 C) 97.8 F (36.6 C)  TempSrc:   Oral Oral  SpO2:   99% 97%  Weight:   77.4 kg   Height:        Intake/Output Summary (Last 24 hours) at 10/12/2018 1854 Last data filed at 10/12/2018 1800 Gross per 24 hour  Intake 342 ml  Output 1000 ml  Net -658 ml   Filed Weights   10/09/18 1106 10/12/18 0655 10/12/18 1102  Weight: 75.9 kg 78.4 kg 77.4 kg    Exam:  Constitutional: Normal appearing male, lying in bed, no distress Eyes: Improving redness in right eye, proptosis, swelling around orbit,EOMI, able to pass finger exam ENMT: No sinus tenderness, Oropharynx with moist mucous membranes Cardiovascular:  no peripheral edema Respiratory: Room air, clear breath sounds, normal respiratory effort Abdomen: Soft,non-tender, normal bowel sounds Skin: No rash ulcers, or lesions. Without skin tenting  Neurologic: Grossly no focal neuro deficit. Psychiatric:Appropriate affect, and mood.   Data Reviewed: CBC: Recent Labs  Lab 10/06/18 0154 10/07/18 0148 10/09/18 0709 10/10/18 0812 10/11/18 0745 10/12/18 0736  WBC 13.5* 10.9* 11.8* 11.5* 17.0* 14.0*  NEUTROABS 10.0*  --   --   --   --   --   HGB 8.7* 8.5* 8.7* 9.7* 8.4* 7.9*  HCT 28.0* 27.5* 27.8* 31.6* 27.6* 25.2*  MCV 97.6 97.2 98.9 98.1 96.5 96.2  PLT 283 253 242 289 286 696   Basic Metabolic Panel: Recent Labs  Lab 10/06/18 0154 10/07/18 0148 10/08/18 0142 10/09/18 0308  10/10/18 0812 10/12/18 0736  NA 139 138 138 138 138 134*  K 4.1 4.4 3.9 4.5 4.7 4.9  CL 97* 95* 96* 96* 94* 90*  CO2 27 26 26 22 22  18*  GLUCOSE 105* 124* 105* 116* 142* 222*  BUN 42* 68* 29* 68* 59* 163*  CREATININE 8.46* 11.73* 6.54* 10.15* 7.56* 13.49*  CALCIUM 8.6* 9.1 8.5* 9.0 9.8 9.5  MG 2.2 2.4 2.1 2.5*  --   --   PHOS  --   --   --   --   --  9.5*   GFR: Estimated Creatinine Clearance: 6.4 mL/min (A) (by C-G formula based on SCr of 13.49 mg/dL (H)). Liver Function  Tests: Recent Labs  Lab 10/12/18 0736  ALBUMIN 2.3*   No results for input(s): LIPASE, AMYLASE in the last 168 hours. No results for input(s): AMMONIA in the last 168 hours. Coagulation Profile: Recent Labs  Lab 10/06/18 0154  INR 1.33   Cardiac Enzymes: No results for input(s): CKTOTAL, CKMB, CKMBINDEX, TROPONINI in the last 168 hours. BNP (last 3 results) No results for input(s): PROBNP in the last 8760 hours. HbA1C: No results for input(s): HGBA1C in the last 72 hours. CBG: Recent Labs  Lab 10/11/18 1707 10/11/18 2141 10/12/18 0640 10/12/18 1230 10/12/18 1730  GLUCAP 238* 235* 225* 146* 214*   Lipid Profile: No results for input(s): CHOL, HDL, LDLCALC, TRIG, CHOLHDL, LDLDIRECT in the last 72 hours. Thyroid Function Tests: No results for input(s): TSH, T4TOTAL, FREET4, T3FREE, THYROIDAB in the last 72 hours. Anemia Panel: No results for input(s): VITAMINB12, FOLATE, FERRITIN, TIBC, IRON, RETICCTPCT in the last 72 hours. Urine analysis:    Component Value Date/Time   COLORURINE RED (A) 07/06/2016 1938   APPEARANCEUR TURBID (A) 07/06/2016 1938   LABSPEC 1.020 07/06/2016 1938   PHURINE 7.5 07/06/2016 1938   GLUCOSEU NEGATIVE 07/06/2016 1938   HGBUR LARGE (A) 07/06/2016 1938   BILIRUBINUR NEGATIVE 07/06/2016 1938   KETONESUR NEGATIVE 07/06/2016 1938   PROTEINUR >300 (A) 07/06/2016 1938   UROBILINOGEN 0.2 11/04/2014 0952   NITRITE NEGATIVE 07/06/2016 1938   LEUKOCYTESUR SMALL (A) 07/06/2016 1938   Sepsis Labs: @LABRCNTIP (procalcitonin:4,lacticidven:4)  ) Recent Results (from the past 240 hour(s))  Culture, blood (routine x 2)     Status: None   Collection Time: 10/04/18  8:09 PM  Result Value Ref Range Status   Specimen Description BLOOD LEFT ARM  Final   Special Requests   Final    BOTTLES DRAWN AEROBIC AND ANAEROBIC Blood Culture adequate volume   Culture   Final    NO GROWTH 5 DAYS Performed at [facility], [place] [address], [place], [place]  [address]    Report Status 10/09/2018 FINAL  Final  Culture, blood (routine x 2)     Status: None   Collection Time: 10/04/18  8:50 PM  Result Value Ref Range Status   Specimen Description BLOOD LEFT FOREARM  Final   Special Requests   Final    BOTTLES DRAWN AEROBIC AND ANAEROBIC Blood Culture adequate volume   Culture   Final    NO GROWTH 5 DAYS Performed at [facility], [place] [address], [place], [place] [address]    Report Status 10/09/2018 FINAL  Final  Culture, blood (Routine X 2) w Reflex to ID Panel     Status: None (Preliminary result)   Collection Time: 10/06/18  4:30 PM  Result Value Ref Range Status   Specimen Description BLOOD LEFT HAND  Final   Special Requests   Final    BOTTLES DRAWN AEROBIC ONLY Blood Culture results may not be optimal due to an inadequate volume of blood received in culture bottles   Culture   Final    NO GROWTH 4 DAYS Performed at [facility], [place] [address] [place], [place] [address]    Report Status PENDING  Incomplete  Expectorated sputum assessment w rflx to resp cult     Status: None   Collection Time: 10/08/18  8:16 PM  Result Value Ref Range Status   Specimen Description EXPECTORATED SPUTUM  Final   Special Requests Normal  Final   Sputum evaluation   Final    THIS SPECIMEN IS ACCEPTABLE FOR SPUTUM CULTURE Performed at [facility], [place] [address] [place], [place] [address]    Report Status 10/09/2018 FINAL  Final  Culture, respiratory     Status: None   Collection Time: 10/08/18  8:16 PM  Result Value Ref Range Status   Specimen Description EXPECTORATED SPUTUM  Final   Special Requests Normal Reflexed from [id number]  Final   Gram Stain   Final    ABUNDANT WBC PRESENT,BOTH PMN AND MONONUCLEAR RARE GRAM POSITIVE COCCI RARE GRAM VARIABLE ROD    Culture   Final    FEW Consistent with normal respiratory flora. Performed at [facility], [place] [address], [place], [place] [address]    Report Status 10/11/2018 FINAL   Final  MRSA PCR Screening     Status: None   Collection Time: 10/09/18  1:37 PM  Result Value Ref Range Status   MRSA by PCR NEGATIVE NEGATIVE Final    Comment:        The GeneXpert MRSA Assay (FDA approved for NASAL specimens only), is one component of a comprehensive MRSA colonization surveillance program. It is not intended to diagnose MRSA infection nor to guide or monitor treatment for MRSA infections. Performed at [facility], [place] [address], [place],  [address]       Studies: No results found.  Scheduled Meds: . artificial tears   Right Eye Q6H  . calcitRIOL  1 mcg Oral Q M,W,F-HD  . calcium acetate  1,334 mg Oral TID WC  . Chlorhexidine Gluconate Cloth  6 each Topical Q0600  . Chlorhexidine Gluconate Cloth  6 each Topical Q0600  . darbepoetin (ARANESP) injection - DIALYSIS  100 mcg Intravenous Q Mon-HD  . feeding supplement (PRO-STAT SUGAR FREE 64)  30 mL Oral BID  . gabapentin  300 mg Oral BID  . heparin injection (subcutaneous)  5,000 Units Subcutaneous Q8H  . insulin aspart  0-5 Units Subcutaneous QHS  . insulin aspart  0-9 Units Subcutaneous TID WC  . risperiDONE  0.25 mg Oral QHS  . sodium chloride  1 spray Each Nare Q0200  . traZODone  100 mg Oral QHS    Continuous Infusions: . methylPREDNISolone (SOLU-MEDROL) injection Stopped (10/12/18 1250)     LOS: 8 days     Desiree Hane, MD [facility] Pager [phone]  If 7PM-7AM, please contact night-coverage [email or web address] Password [facility] 10/12/2018, 6:54 PM

## 2018-10-12 NOTE — Progress Notes (Signed)
[facility] Progress Note   Subjective:  No new c/o's.    Objective Vitals:   10/12/18 0715 10/12/18 0745 10/12/18 0815 10/12/18 0845  BP: 115/61 113/69 (!) 115/58 106/66  Pulse: 85 93 91 89  Resp: 17 16 17 16   Temp:      TempSrc:      SpO2:   99% 98%  Weight:      Height:       Physical Exam Gen: looking better, R eye protusion and redness mostly resolved, coughing gone Heart:RRR; 2/6 systolic murmur Lungs: clear bilat Abdomen:Soft, non-tender Extremities:No LE edema Dialysis Access:AVF + thrill   Dialysis Orders: [facility]at [facility] 4h 400/800 77.5kg 2/2 bath R AVF Hep 7600 - Mircera75 every 2 wks (looks like ordered after last admit, but not given) -Calcitriol77mcg PO q HD   Summary: H/o Biopsy-proven ANCA positive necrotizing glomerulonephritis treated with Cytoxan and steroids in 2010 in [place], admitted in 2014 here for alveolar hemorrhage, renal failure requiring Cytoxan and steroids.  [name]  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 Off Rx for years. SP lung biopsy of LUL pulm nodule here , results c/w Wegener's. 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 Started on bolus IV steroids, cough and R eye sig better. Will defer further immunosuppression to CCM.   ESRD on HD: HD MWF.  HD today.   HTN/volume:BP ok, no edema on exam, min UF on HD  Anemia ckd:Hgb 8.5, due for ESA 100 ug will give on HD today .   Secondary hyperparathyroidism:Ca/Phos ok. Continue home meds.  Nutrition:Alb very low, continue Pro-stat BID  James Splinter MD James Nicholson pgr [phone]   10/12/2018, 9:42 AM    Additional Objective Labs: Basic Metabolic Panel: Recent Labs  Lab 10/09/18 0308 10/10/18 0812 10/12/18 0736  NA 138 138 134*  K 4.5 4.7 4.9  CL 96* 94* 90*  CO2 22 22 18*  GLUCOSE 116* 142*  222*  BUN 68* 59* 163*  CREATININE 10.15* 7.56* 13.49*  CALCIUM 9.0 9.8 9.5  PHOS  --   --  9.5*   Liver Function Tests: Recent Labs  Lab 10/12/18 0736  ALBUMIN 2.3*   CBC: Recent Labs  Lab 10/06/18 0154 10/07/18 0148 10/09/18 0709 10/10/18 0812 10/11/18 0745 10/12/18 0736  WBC 13.5* 10.9* 11.8* 11.5* 17.0* 14.0*  NEUTROABS 10.0*  --   --   --   --   --   HGB 8.7* 8.5* 8.7* 9.7* 8.4* 7.9*  HCT 28.0* 27.5* 27.8* 31.6* 27.6* 25.2*  MCV 97.6 97.2 98.9 98.1 96.5 96.2  PLT 283 253 242 289 286 270   CBG: Recent Labs  Lab 10/11/18 0805 10/11/18 1209 10/11/18 1707 10/11/18 2141 10/12/18 0640  GLUCAP 157* 210* 238* 235* 225*   Medications: . methylPREDNISolone (SOLU-MEDROL) injection 250 mg (10/12/18 0441)   . artificial tears   Right Eye Q6H  . calcitRIOL  1 mcg Oral Q M,W,F-HD  . calcium acetate  1,334 mg Oral TID WC  . Chlorhexidine Gluconate Cloth  6 each Topical Q0600  . Chlorhexidine Gluconate Cloth  6 each Topical Q0600  . darbepoetin (ARANESP) injection - DIALYSIS  100 mcg Intravenous Q Mon-HD  . feeding supplement (PRO-STAT SUGAR FREE 64)  30 mL Oral BID  . gabapentin  300 mg Oral BID  . heparin injection (subcutaneous)  5,000 Units Subcutaneous Q8H  . insulin aspart  0-5 Units Subcutaneous QHS  . insulin aspart  0-9 Units Subcutaneous TID WC  . ipratropium-albuterol  3 mL Nebulization QID  . risperiDONE  0.25 mg Oral QHS  . sodium chloride  1 spray Each Nare Q0200  . traZODone  100 mg Oral QHS

## 2018-10-12 NOTE — Progress Notes (Addendum)
NAMEKortland Nicholson, MRN:  [id number], DOB:  1966-05-11, LOS: 8 ADMISSION DATE:  10/04/2018, CONSULTATION DATE:  10/08/18 REFERRING MD:  Theodoro Grist MD, CHIEF COMPLAINT: Slow to resolve pneumonia.  Brief History   52 year old with history of Wegener's disease, renal failure.  Biopsy-proven ANCA positive necrotizing glomerulonephritis.Former smoker , quit 2010, no pack year history documented. Moved to [place] from [place] in 2010. ESRD on HD since 2014 or 2016  Recent admission from 12/1/5 to 10/01/2018 for pneumonia.  Readmitted on 12/12 for recurrence of symptoms. Underwent CT-guided biopsy of apical lung mass 10/07/18 (NECROTIZING GRANULOMATOUS INFLAMMATION WITH NEUTROPHILIC MICROABSCESSES.).  PCCM consulted 12/12 slow to resolve symptoms.  Results for James Nicholson, James Nicholson (MRN [id number]) as of 10/12/2018 17:12  Ref. Range 02/05/2013 07:10 10/08/2018 10:42  ANCA Proteinase 3 Latest Ref Range: 0.0 - 3.5 U/mL  71.2 (H)  Myeloperoxidase Abs Latest Ref Range: 0.0 - 9.0 U/mL 3 <9.0  Serine Protease 3 Latest Ref Range: <20 AU/mL 927 (H)   c-ANCA Screen Latest Ref Range: NEGATIVE  POSITIVE (A)   Cytoplasmic (C-ANCA) Latest Ref Range: <1:20  1:320 (H)   p-ANCA Screen Latest Ref Range: NEGATIVE  NEGATIVE   Atypical p-ANCA Screen Latest Ref Range: NEGATIVE  NEGATIVE     Past Medical History  History of atrial fibrillation, cardiomyopathy s/p AICD, ESRD - MWF, schizophrenia, latent TB status post INH therapy in 2014. Biopsy-proven ANCA positive necrotizing glomerulonephritis treated with Cytoxan and steroids in 2010 in [place], admitted in 2014 for alveolar hemorrhage, renal failure requiring Cytoxan and steroids.  [name]  Significant Hospital Events   10/04/2018>> Hospital Admission  Consults:  12/2 Nephrology 12/10 IR  Procedures:  12/11- CT guided biopsy of apical mass> granulomatous inflammation with neutrophilic micro-abscesses  Significant Diagnostic  Tests:  CT chest 10/04/2018- bilateral patchy airspace disease, 17 cm left apical nodule, aortic atherosclerosis.  Head CT 12/12-3 cm right orbital mass with significant proptosis. 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 The mass is contiguous with active sinus disease via a defect in the medial lower orbit. 2. Left mastoid and middle ear opacification with negative nasopharynx.  Micro Data:  Blood cx 12/8- No growth Blood cx 12/10- No growth 12/12 Respiratory>> normal flora MRSA PCR 10/09/18 - negative  Antimicrobials:  Vanco 12/9>> Cefepime 12/8 >>  Interim history/subjective:   12/16 - says vision better and says hemoptysis reduced. ? History can be trusted due to language barrier. Per RN at bedside - no rituxan given since admission 10/04/2018   Objective   Blood pressure 104/64, pulse 92, temperature 97.8 F (36.6 C), temperature source Oral, resp. rate 18, height 5\' 9"  (1.753 m), weight 77.4 kg, SpO2 97 %.        Intake/Output Summary (Last 24 hours) at 10/12/2018 1711 Last data filed at 10/12/2018 1102 Gross per 24 hour  Intake 290 ml  Output 1000 ml  Net -710 ml   Filed Weights   10/09/18 1106 10/12/18 0655 10/12/18 1102  Weight: 75.9 kg 78.4 kg 77.4 kg    Examination: General Appearance:  Looks well Head:  Normocephalic, without obvious abnormality, atraumatic Eyes:  PERRL - yes, conjunctiva/corneas - clear     Ears:  Normal external ear canals, both ears Nose:  G tube - no Throat:  ETT TUBE - no , OG tube - no Neck:  Supple,  No enlargement/tenderness/nodules Lungs: Clear to auscultation bilaterally,  Heart:  S1 and S2 normal, no murmur, CVP - na.  Pressors - no Abdomen:  Soft, no masses, no organomegaly Genitalia / Rectal:  Not done Extremities:  Extremities- intact Skin:  ntact in exposed areas .  Neurologic:  Sedation - none -> RASS - +1  . Moves all 4s - yes. CAM-ICU - neg . Orientation - x3+      LABS    PULMONARY No results for input(s): PHART, PCO2ART, PO2ART, HCO3, TCO2, O2SAT in the last 168 hours.  Invalid input(s): PCO2, PO2  CBC Recent Labs  Lab 10/10/18 0812 10/11/18 0745 10/12/18 0736  HGB 9.7* 8.4* 7.9*  HCT 31.6* 27.6* 25.2*  WBC 11.5* 17.0* 14.0*  PLT 289 286 270    COAGULATION Recent Labs  Lab 10/06/18 0154  INR 1.33    CARDIAC  No results for input(s): TROPONINI in the last 168 hours. No results for input(s): PROBNP in the last 168 hours.   CHEMISTRY Recent Labs  Lab 10/06/18 0154 10/07/18 0148 10/08/18 0142 10/09/18 0308 10/10/18 0812 10/12/18 0736  NA 139 138 138 138 138 134*  K 4.1 4.4 3.9 4.5 4.7 4.9  CL 97* 95* 96* 96* 94* 90*  CO2 27 26 26 22 22  18*  GLUCOSE 105* 124* 105* 116* 142* 222*  BUN 42* 68* 29* 68* 59* 163*  CREATININE 8.46* 11.73* 6.54* 10.15* 7.56* 13.49*  CALCIUM 8.6* 9.1 8.5* 9.0 9.8 9.5  MG 2.2 2.4 2.1 2.5*  --   --   PHOS  --   --   --   --   --  9.5*   Estimated Creatinine Clearance: 6.4 mL/min (A) (by C-G formula based on SCr of 13.49 mg/dL (H)).   LIVER Recent Labs  Lab 10/06/18 0154 10/12/18 0736  ALBUMIN  --  2.3*  INR 1.33  --      INFECTIOUS No results for input(s): LATICACIDVEN, PROCALCITON in the last 168 hours.   ENDOCRINE CBG (last 3)  Recent Labs    10/11/18 2141 10/12/18 0640 10/12/18 1230  GLUCAP 235* 225* 146*         IMAGING x48h  - image(s) personally visualized  -   highlighted in bold No results found.    Resolved Hospital Problem list     Assessment & Plan:  52 year old with history of small vessel vasculitis admitted with a flare of the same causing a pneumonia syndrome and severe swelling in sinuses and R Eye involvement and R.  Lung nodule is due to wegeners,   ANCA Vasculitis WEgner High Risk Medication Use  PLAN -  Solumedrol 1gm/day for the next 48 hours (250mg  IV q6h); 12/15 - 12/17  and then do prednisone 60mg  per day -needs plan for administration of Rituximab weekly for 4 weeks per RAVE trial    - on MWF HD - ? Can get first dose of Rituxan 10/13/18 instead of 10/14/18-  Will discuss with renal and pharmacy 10/13/18 - give this inpatient and rest via infusion center short stay at [facility] (currently being pulsed with steroids)  - check G6PD and if normal -start bactrim or dapsone for PJP prophylaxis        SIGNATURE    Dr. Brand Males, M.D., F.C.C.P,  Pulmonary and Critical Care Medicine Staff Physician, [place] Director - Interstitial Lung Disease  Program  Pulmonary [facility] at [place], [place], [address]  Pager: [phone], If no answer or between  15:00h -  7:00h: call [phone] Telephone: [phone]  5:11 PM 10/12/2018

## 2018-10-13 ENCOUNTER — Telehealth: Payer: Self-pay | Admitting: Internal Medicine

## 2018-10-13 DIAGNOSIS — I776 Arteritis, unspecified: Secondary | ICD-10-CM

## 2018-10-13 LAB — CBC
HCT: 27.5 % — ABNORMAL LOW (ref 39.0–52.0)
Hemoglobin: 8.5 g/dL — ABNORMAL LOW (ref 13.0–17.0)
MCH: 29.5 pg (ref 26.0–34.0)
MCHC: 30.9 g/dL (ref 30.0–36.0)
MCV: 95.5 fL (ref 80.0–100.0)
Platelets: 296 10*3/uL (ref 150–400)
RBC: 2.88 MIL/uL — ABNORMAL LOW (ref 4.22–5.81)
RDW: 14.5 % (ref 11.5–15.5)
WBC: 15.7 10*3/uL — ABNORMAL HIGH (ref 4.0–10.5)
nRBC: 0 % (ref 0.0–0.2)

## 2018-10-13 LAB — BASIC METABOLIC PANEL
Anion gap: 20 — ABNORMAL HIGH (ref 5–15)
BUN: 86 mg/dL — ABNORMAL HIGH (ref 6–20)
CO2: 23 mmol/L (ref 22–32)
Calcium: 9 mg/dL (ref 8.9–10.3)
Chloride: 91 mmol/L — ABNORMAL LOW (ref 98–111)
Creatinine, Ser: 8.08 mg/dL — ABNORMAL HIGH (ref 0.61–1.24)
GFR calc Af Amer: 8 mL/min — ABNORMAL LOW (ref >60)
GFR calc non Af Amer: 7 mL/min — ABNORMAL LOW (ref >60)
Glucose, Bld: 177 mg/dL — ABNORMAL HIGH (ref 70–99)
Potassium: 4.4 mmol/L (ref 3.5–5.1)
Sodium: 134 mmol/L — ABNORMAL LOW (ref 135–145)

## 2018-10-13 LAB — CULTURE, BLOOD (ROUTINE X 2): Culture: NO GROWTH

## 2018-10-13 LAB — GLUCOSE, CAPILLARY
Glucose-Capillary: 154 mg/dL — ABNORMAL HIGH (ref 70–99)
Glucose-Capillary: 153 mg/dL — ABNORMAL HIGH (ref 70–99)
Glucose-Capillary: 151 mg/dL — ABNORMAL HIGH (ref 70–99)
Glucose-Capillary: 181 mg/dL — ABNORMAL HIGH (ref 70–99)

## 2018-10-13 LAB — HEPATITIS B SURFACE ANTIBODY,QUALITATIVE: Hep B S Ab: NONREACTIVE

## 2018-10-13 MED ORDER — DIPHENHYDRAMINE HCL 25 MG PO CAPS
25.0000 mg | ORAL_CAPSULE | Freq: Once | ORAL | Status: AC
  Administered 2018-10-13: 25 mg via ORAL
  Filled 2018-10-13: qty 1

## 2018-10-13 MED ORDER — SODIUM CHLORIDE 0.9 % IV SOLN
375.0000 mg/m2 | Freq: Once | INTRAVENOUS | Status: AC
  Administered 2018-10-13: 700 mg via INTRAVENOUS
  Filled 2018-10-13: qty 70

## 2018-10-13 NOTE — Telephone Encounter (Signed)
Dr. Vaughan Browner, please advise if you will be okay signing the physician's orders form so it can be faxed once all info on form has been taken care of. Thanks!

## 2018-10-13 NOTE — Telephone Encounter (Signed)
Does pt need to have any pre-medications prior to receiving Rituxan?

## 2018-10-13 NOTE — Progress Notes (Addendum)
NAMEMatthews Nicholson, MRN:  [id number], DOB:  1966/05/21, LOS: 9 ADMISSION DATE:  10/04/2018, CONSULTATION DATE:  10/08/18 REFERRING MD:  Theodoro Grist MD, CHIEF COMPLAINT: Slow to resolve pneumonia.  Brief History   52 year old with history of Wegener's disease, renal failure.  Biopsy-proven ANCA positive necrotizing glomerulonephritis.Former smoker , quit 2010, no pack year history documented. Moved to [place] from [place] in 2010. ESRD on HD since 2014 or 2016  Recent admission from 12/1/5 to 10/01/2018 for pneumonia.  Readmitted on 12/12 for recurrence of symptoms. Underwent CT-guided biopsy of apical lung mass 10/07/18 (NECROTIZING GRANULOMATOUS INFLAMMATION WITH NEUTROPHILIC MICROABSCESSES.).  PCCM consulted 12/12 slow to resolve symptoms.  Results for James Nicholson, [name] (MRN [id number]) as of 10/12/2018 17:12  Ref. Range 02/05/2013 07:10 10/08/2018 10:42  ANCA Proteinase 3 Latest Ref Range: 0.0 - 3.5 U/mL  71.2 (H)  Myeloperoxidase Abs Latest Ref Range: 0.0 - 9.0 U/mL 3 <9.0  Serine Protease 3 Latest Ref Range: <20 AU/mL 927 (H)   c-ANCA Screen Latest Ref Range: NEGATIVE  POSITIVE (A)   Cytoplasmic (C-ANCA) Latest Ref Range: <1:20  1:320 (H)   p-ANCA Screen Latest Ref Range: NEGATIVE  NEGATIVE   Atypical p-ANCA Screen Latest Ref Range: NEGATIVE  NEGATIVE     Past Medical History  History of atrial fibrillation, cardiomyopathy s/p AICD, ESRD - MWF, schizophrenia, latent TB status post INH therapy in 2014. Biopsy-proven ANCA positive necrotizing glomerulonephritis treated with Cytoxan and steroids in 2010 in [place], admitted in 2014 for alveolar hemorrhage, renal failure requiring Cytoxan and steroids.  [name]  Significant Hospital Events   10/04/2018>> Hospital Admission\ 10/04/2018 CT chest- bilateral patchy airspace disease, 17 cm left apical nodule, aortic atherosclerosi 12/11- CT guided biopsy of apical mass> granulomatous inflammation with neutrophilic  micro-abscesses   12/12-CT head 3 cm right orbital mass with significant proptosis. 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 The mass is contiguous with active sinus disease via a defect in the medial lower orbit. 2. Left mastoid and middle ear opacification with negative Nasopharynx. 12/16 - says vision better and says hemoptysis reduced. ? History can be trusted due to language barrier. Per RN at bedside - no rituxan given since admission 10/04/2018   Consults:  12/2 Nephrology 12/10 IR   Micro Data:  Blood cx 12/8- No growth Blood cx 12/10- No growth 12/12 Respiratory>> normal flora MRSA PCR 10/09/18 - negative  Antimicrobials:  Vanco 12/9>>12/14 Cefepime 12/8 >>12/15     Interim history/subjective:   12/17 - first dose rituxan being given  now (G6PD pending) @ 375,g/m2 after extensive coordination with pharmacy. RN  Reports no problems so far. Patient nodding yes for any question. Overall reports being ok and no issues with infusion   Objective   Blood pressure 134/73, pulse 75, temperature 98.2 F (36.8 C), resp. rate 18, height 5\' 9"  (1.753 m), weight 77.4 kg, SpO2 97 %.        Intake/Output Summary (Last 24 hours) at 10/13/2018 1759 Last data filed at 10/13/2018 1319 Gross per 24 hour  Intake 804 ml  Output -  Net 804 ml   Filed Weights   10/09/18 1106 10/12/18 0655 10/12/18 1102  Weight: 75.9 kg 78.4 kg 77.4 kg   General Appearance:  Looks well Head:  Normocephalic, without obvious abnormality, atraumatic Eyes:  PERRL - yes, conjunctiva/corneas - clear     Ears:  Normal external ear  canals, both ears Nose:  G tube - no Throat:  ETT TUBE - no , OG tube - no Neck:  Supple,  No enlargement/tenderness/nodules Lungs: Clear to auscultation bilaterally,  Heart:  S1 and S2 normal, no murmur, CVP - no.  Pressors - no Abdomen:  Soft, no masses, no  organomegaly Genitalia / Rectal:  Not done Extremities:  Extremities- intact Skin:  ntact in exposed areas . Sacral area - not examiond Neurologic:  Sedation - none -> RASS - +1 . Moves all 4s - ys. CAM-ICU - neg . Orientation - x3+         LABS    PULMONARY No results for input(s): PHART, PCO2ART, PO2ART, HCO3, TCO2, O2SAT in the last 168 hours.  Invalid input(s): PCO2, PO2  CBC Recent Labs  Lab 10/11/18 0745 10/12/18 0736 10/13/18 0656  HGB 8.4* 7.9* 8.5*  HCT 27.6* 25.2* 27.5*  WBC 17.0* 14.0* 15.7*  PLT 286 270 296    COAGULATION No results for input(s): INR in the last 168 hours.  CARDIAC  No results for input(s): TROPONINI in the last 168 hours. No results for input(s): PROBNP in the last 168 hours.   CHEMISTRY Recent Labs  Lab 10/07/18 0148 10/08/18 0142 10/09/18 0308 10/10/18 0812 10/12/18 0736 10/13/18 0656  NA 138 138 138 138 134* 134*  K 4.4 3.9 4.5 4.7 4.9 4.4  CL 95* 96* 96* 94* 90* 91*  CO2 26 26 22 22  18* 23  GLUCOSE 124* 105* 116* 142* 222* 177*  BUN 68* 29* 68* 59* 163* 86*  CREATININE 11.73* 6.54* 10.15* 7.56* 13.49* 8.08*  CALCIUM 9.1 8.5* 9.0 9.8 9.5 9.0  MG 2.4 2.1 2.5*  --   --   --   PHOS  --   --   --   --  9.5*  --    Estimated Creatinine Clearance: 10.7 mL/min (A) (by C-G formula based on SCr of 8.08 mg/dL (H)).   LIVER Recent Labs  Lab 10/12/18 0736  ALBUMIN 2.3*     INFECTIOUS No results for input(s): LATICACIDVEN, PROCALCITON in the last 168 hours.   ENDOCRINE CBG (last 3)  Recent Labs    10/13/18 0817 10/13/18 1212 10/13/18 1734  GLUCAP 154* 153* 151*         IMAGING x48h  - image(s) personally visualized  -   highlighted in bold No results found.    Resolved Hospital Problem list     Assessment & Plan:  51 year old with history of small vessel vasculitis admitted with a flare of the same causing a pneumonia syndrome and severe swelling in sinuses and R Eye involvement and R.  Lung nodule  is due to wegeners,   ANCA Vasculitis -WEgner High Risk Medication Use  PLAN - Continue high dose  Solumedrol 1gm/day for total 72h; (250mg  IV q6h); 12/15 - 12/18 and then do prednisone 60mg  per day - over few months - will titrate in clinic - Rituximab weekly for 4 weeks per RAVE trial at 375mg /meter square   -first dose on 10/13/2018   -  - dose 2, 3 and 4 - coordinated from pulmonary office (work in progress)   - await G6PD and if normal -start bactrim or dapsone for PJP prophylaxis    Followup  - will arrange in ILD clinic - Dr Ronie Spies   * PCCM will not see patient 10/14/18 if stable/well but will coordinate care  D/w Dr Lisbeth Ply  > 50% of this > 40 min visit spent  in face to face counseling or/and coordination of care - by this undersigned MD - Dr Brand Males. This includes one or more of the following documented above: discussion of test results, diagnostic or treatment recommendations, prognosis, risks and benefits of management options, instructions, education, compliance or risk-factor reduction   SIGNATURE    Dr. Brand Males, M.D., F.C.C.P,  Pulmonary and Critical Care Medicine Staff Physician, [place] Director - Interstitial Lung Disease  Program  Pulmonary [place] at [place], [place], [address]  Pager: [phone], If no answer or between  15:00h - 7:00h: call [phone] Telephone: [phone]  5:59 PM 10/13/2018

## 2018-10-13 NOTE — Plan of Care (Signed)
  Problem: Education: Goal: Knowledge of General Education information will improve Description Including pain rating scale, medication(s)/side effects and non-pharmacologic comfort measures Outcome: Progressing   Problem: Health Behavior/Discharge Planning: Goal: Ability to manage health-related needs will improve Outcome: Progressing   Problem: Clinical Measurements: Goal: Will remain free from infection Outcome: Progressing   Problem: Activity: Goal: Risk for activity intolerance will decrease Outcome: Progressing   Problem: Nutrition: Goal: Adequate nutrition will be maintained Outcome: Progressing   Problem: Coping: Goal: Level of anxiety will decrease Outcome: Progressing   Problem: Pain Managment: Goal: General experience of comfort will improve Outcome: Progressing   Problem: Safety: Goal: Ability to remain free from injury will improve Outcome: Progressing   Problem: Skin Integrity: Goal: Risk for impaired skin integrity will decrease Outcome: Progressing

## 2018-10-13 NOTE — Progress Notes (Addendum)
[facility] Progress Note   Subjective:  Seen in room. Alert. Coughing with post-tussive gagging.   Objective Vitals:   10/12/18 1102 10/12/18 1535 10/12/18 2206 10/13/18 0557  BP: 114/67 104/64 (!) 111/59 114/73  Pulse: 86 92 87 87  Resp: 18     Temp: 97.9 F (36.6 C) 97.8 F (36.6 C) 98.2 F (36.8 C) 97.9 F (36.6 C)  TempSrc: Oral Oral  Oral  SpO2: 99% 97% 97% 97%  Weight: 77.4 kg     Height:       Physical Exam Gen: looking better, R eye protusion and redness mostly resolved.  Heart:RRR; 2/6 systolic murmur Lungs: clear bilat Abdomen:Soft, non-tender Extremities:No LE edema Dialysis Access:RUE AVF +bruit    Dialysis Orders: MWFat [facility] 4h 400/800 77.5kg 2/2 bath R AVF Hep 7600 - Mircera75 every 2 wks (looks like ordered after last admit, but not given) -Calcitriol56mcg PO q HD   Summary: H/o Biopsy-proven ANCA positive necrotizing glomerulonephritis treated with Cytoxan and steroids in 2010 in [place], admitted in 2014 here for alveolar hemorrhage, renal failure requiring Cytoxan and steroids.  [name]  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 Off Rx for years. SP lung biopsy of LUL pulm nodule here , results c/w Wegener's. Has dense RLL opacity.  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 Started on bolus IV steroids, cough and R eye sig better. Further immunosuppression per CCM.   ESRD on HD: HD MWF. Continue on schedule.   HTN/volume:BP ok. Volume stable. Lower dry wt slightly if possible.   Anemia ckd:Hgb 8.5. Aranesp 100 dosed 12/16.   Secondary hyperparathyroidism:Ca ok. Phos elevated. Follow on PhosLo binder.   Nutrition:Alb very low, continue Pro-stat BID  Lynnda Child PA-C [place] Pager [phone] 10/13/2018,12:18 PM  Pt seen, examined and agree w A/P as above.  Kelly Splinter  MD [facility] pager [phone]   10/13/2018, 3:09 PM     Additional Objective Labs: Basic Metabolic Panel: Recent Labs  Lab 10/10/18 0812 10/12/18 0736 10/13/18 0656  NA 138 134* 134*  K 4.7 4.9 4.4  CL 94* 90* 91*  CO2 22 18* 23  GLUCOSE 142* 222* 177*  BUN 59* 163* 86*  CREATININE 7.56* 13.49* 8.08*  CALCIUM 9.8 9.5 9.0  PHOS  --  9.5*  --    Liver Function Tests: Recent Labs  Lab 10/12/18 0736  ALBUMIN 2.3*   CBC: Recent Labs  Lab 10/09/18 0709 10/10/18 0812 10/11/18 0745 10/12/18 0736 10/13/18 0656  WBC 11.8* 11.5* 17.0* 14.0* 15.7*  HGB 8.7* 9.7* 8.4* 7.9* 8.5*  HCT 27.8* 31.6* 27.6* 25.2* 27.5*  MCV 98.9 98.1 96.5 96.2 95.5  PLT 242 289 286 270 296   CBG: Recent Labs  Lab 10/12/18 0640 10/12/18 1230 10/12/18 1730 10/12/18 2214 10/13/18 0817  GLUCAP 225* 146* 214* 220* 154*   Medications: . methylPREDNISolone (SOLU-MEDROL) injection 250 mg (10/13/18 1004)   . artificial tears   Right Eye Q6H  . calcitRIOL  1 mcg Oral Q M,W,F-HD  . calcium acetate  1,334 mg Oral TID WC  . Chlorhexidine Gluconate Cloth  6 each Topical Q0600  . Chlorhexidine Gluconate Cloth  6 each Topical Q0600  . darbepoetin (ARANESP) injection - DIALYSIS  100 mcg Intravenous Q Mon-HD  . feeding supplement (PRO-STAT SUGAR FREE 64)  30 mL Oral BID  . gabapentin  300 mg Oral BID  . heparin injection (  subcutaneous)  5,000 Units Subcutaneous Q8H  . insulin aspart  0-5 Units Subcutaneous QHS  . insulin aspart  0-9 Units Subcutaneous TID WC  . risperiDONE  0.25 mg Oral QHS  . riTUXimab (RITUXAN) IV infusion  375 mg/m2 Intravenous Once  . sodium chloride  1 spray Each Nare Q0200  . traZODone  100 mg Oral QHS

## 2018-10-13 NOTE — Evaluation (Signed)
Occupational Therapy Evaluation Patient Details Name: James Nicholson MRN: [id number] DOB: 07-Apr-1966 Today's Date: 10/13/2018    History of Present Illness 52 y.o. male of [place] descent with granulomatous polyangiitis (P-ANCA), ESRD on HD (MWF), IDDM, schizophrenia diagnosis (per chart), hx of VT arrest s/p ICD (2010) who was recently hospitalized for suspected PNA and discharged with PO abx but returns with ongoing dyspnea, worsening cough with blood tinged sputum. admitted on 10/04/2018 with persistent respiratory symptoms concerning for possible pneumonia. Lung nodule is due to wegeners, not organ threatening right now..  found to have orbital mass on CT scan upon evaluation of left eye redness/proptosis.   Clinical Impression   Pt is a 52 yo male s/p above dxs and persistent cough. Pt presents with generalized weakness with mobility and ADL functional transfers using RW requiring verbal cues for proper technique and min guardA. Pt performing light grooming in standing at sink and performed own toilet hygiene and LB dressing management with [place]. Pt prior level of function- recently hospitalized at [facility] for PNA.  OTR using phone for [place] translator at this time and pt able to follow all commands. Pt unable to describe supervision level at current group home which is concerning for risk of falls and rehospitalization. Pt would benefit from continued OT skilled services in a SNF setting for ADLs, mobility and safety. Thank you for this referral.    Follow Up Recommendations  SNF    Equipment Recommendations       Recommendations for Other Services       Precautions / Restrictions Precautions Precautions: Fall Restrictions Weight Bearing Restrictions: No      Mobility Bed Mobility Overal bed mobility: Modified Independent                Transfers Overall transfer level: Needs assistance(minguardA) Equipment used: Rolling walker (2 wheeled) Transfers:  Sit to/from Stand Sit to Stand: Min guard         General transfer comment:  min guard assist for safety(min guard for safety )    Balance Overall balance assessment: Needs assistance Sitting-balance support: Feet supported;No upper extremity supported Sitting balance-Leahy Scale: Good     Standing balance support: No upper extremity supported;Bilateral upper extremity supported;Single extremity supported Standing balance-Leahy Scale: Fair Standing balance comment: pt requires at least single UE support for satbility                           ADL either performed or assessed with clinical judgement   ADL Overall ADL's : Needs assistance/impaired(for safety to slow down) Eating/Feeding: Set up   Grooming: Supervision/safety   Upper Body Bathing: Set up   Lower Body Bathing: Min guard;Sit to/from stand   Upper Body Dressing : Set up   Lower Body Dressing: Min guard;Sit to/from stand   Toilet Transfer: Min guard;Cueing for Designer, television/film set and Hygiene: Min guard;Sit to/from stand   Tub/ Banker: Cueing for safety   Functional mobility during ADLs: Min guard General ADL Comments: SetupA to MinA for ADLs due to safety cues required.     Vision Baseline Vision/History: Wears glasses Wears Glasses: At all times Vision Assessment?: No apparent visual deficits     Perception     Praxis      Pertinent Vitals/Pain Pain Assessment: Faces Faces Pain Scale: Hurts a little bit Pain Location: R side of abdomen(throat with coughing ) Pain Descriptors / Indicators: Aching;Sore Pain Intervention(s): Monitored during session  Hand Dominance Right   Extremity/Trunk Assessment Upper Extremity Assessment Upper Extremity Assessment: Overall WFL for tasks assessed   Lower Extremity Assessment Lower Extremity Assessment: Defer to PT evaluation   Cervical / Trunk Assessment Cervical / Trunk Assessment: Normal   Communication  Communication Communication: Prefers language other than English(Serbian)   Cognition Arousal/Alertness: Awake/alert Behavior During Therapy: WFL for tasks assessed/performed Overall Cognitive Status: No family/caregiver present to determine baseline cognitive functioning                                 General Comments: Pt speaks broken english and requires increased time to process comands- not sure if it's due to language barrier or congnitive deficits.(used interpreter)   General Comments  Pt with underlying cognitive deficits unsure whether he didn't understand due to language or deficits    Exercises     Shoulder Instructions      Home Living Family/patient expects to be discharged to:: Unsure     Type of Home: Group Home Home Access: Stairs to enter Entrance Stairs-Number of Steps: 3 Entrance Stairs-Rails: None Home Layout: Two level               Home Equipment: Walker - 2 wheels   Additional Comments: Sometimes he stays with his brother      Prior Functioning/Environment Level of Independence: Independent                 OT Problem List: Decreased strength;Decreased activity tolerance;Impaired balance (sitting and/or standing);Decreased safety awareness;Pain      OT Treatment/Interventions: Self-care/ADL training;Therapeutic exercise;Energy conservation;Therapeutic activities    OT Goals(Current goals can be found in the care plan section) Acute Rehab OT Goals Patient Stated Goal: to stay out of the hospital OT Goal Formulation: With patient Time For Goal Achievement: 10/13/18 Potential to Achieve Goals: Good ADL Goals Additional ADL Goal #1: Pt will perform ADL functional mobility with good safety awareness using appropriate adaptive device with Supervision A. Additional ADL Goal #2: Pt will increase to supervisionA for ADLs in standing at sink.  OT Frequency: Min 2X/week   Barriers to D/C: Decreased caregiver support           Co-evaluation              AM-PAC OT "6 Clicks" Daily Activity     Outcome Measure Help from another person eating meals?: None Help from another person taking care of personal grooming?: None Help from another person toileting, which includes using toliet, bedpan, or urinal?: None Help from another person bathing (including washing, rinsing, drying)?: A Little Help from another person to put on and taking off regular upper body clothing?: None Help from another person to put on and taking off regular lower body clothing?: A Little 6 Click Score: 22   End of Session Equipment Utilized During Treatment: Gait belt Nurse Communication: Mobility status  Activity Tolerance: Patient tolerated treatment well Patient left: in chair;with call bell/phone within reach  OT Visit Diagnosis: Unsteadiness on feet (R26.81);Muscle weakness (generalized) (M62.81)                Time: 5465-6812 OT Time Calculation (min): 35 min Charges:  OT General Charges $OT Visit: 1 Visit OT Evaluation $OT Eval Moderate Complexity: 1 Mod OT Treatments $Self Care/Home Management : 8-22 mins  [facility]-ACUTE REHAB   10/13/2018  Darryl Nestle) Marsa Aris OTR/L Acute Rehabilitation Services Pager: [phone] Office: [phone]  Fredda Hammed  10/13/2018, 2:45 PM

## 2018-10-13 NOTE — Telephone Encounter (Signed)
MR, please advise what Diagnosis you want [place] to place on the physician's orders form for the Rituxan. Thanks!

## 2018-10-13 NOTE — Care Management (Signed)
Spoke to [employer] [facility] in short stay regarding weekly Rituximab infusions. First available appointment they have at present is October 26, 2018. They cannot hold appointment until prescription for Rituximab is determined and pre authorization is done , following doctor is determined and short stay referral form that was placed in patient's chart is completed and signed by a MD.   Magdalen Spatz RN BSN [phone]

## 2018-10-13 NOTE — Progress Notes (Signed)
PROGRESS NOTE  Jamale Spangler RXV:[id number] DOB: 07/06/1966 DOA: 10/04/2018 PCP: [name], MD  HPI/Brief Narrative  James Nicholson is a 52 y.o. year old male of [place] descent with medical history significant for ESRD on HD, ANCA granulomatosis polyangiitis (diagnosed 2010), schizophrenia, history of VT arrest status post ICD (2010), history of latent TB (status post 9 months of isoniazid 04/2012) who presented on 10/04/2018 with worsening cough, dyspnea and blood-tinged sputum after recent hospitalization and discharge (12/1-12/02/2018) for presumed H CAP for which he empirically received IV ceftriaxone and azithromycin and was discharged on cefdinir and azithromycin to complete 7-day course.  Hospital course complicated by: found to have orbital mass on CT scan upon evaluation of left eye redness/proptosis. Concerning for granuloma related to Wegener's. Patient still has intact vision so recommended to continue artificial tears per ophthalmology.  IR had obtained biopsy of pulmonary nodule initially concerning for malignancy but found to be consistent with granulomatosis.  Patient was started on IV Solu-Medrol pulse dosing by direction of pulmonary consultants will previously consulted due to poor clinical response with broad-spectrum antibiotics with presentation was initially thought to be related to potential pneumonia.  He did complete 7 full days of IV cefepime.  He has had significant improvement in his cough, shortness of breath, eye swelling and eye redness since initiating Solu-Medrol dosing.  Rituxan infusion was started on 12/17.    Subjective Some posttussive emesis but cough much improved Diminished eye swelling, no eye pain, no changes in vision   Assessment/Plan:  #Acute hypoxic respiratory failure related to bilateral basilar opacities secondary to active Wegener's flare, continues to improve.  Currently on room air.  Tolerating Solu-Medrol pulse dosing, first dose  of Rituxan infusion to start on 12/17 under pulmonary guidance. ultation, Case manager assisting with arranging outpatient Rituxan infusion( will need weekly for 4 weeks) with short stay at [facility], it is unclear currently what physician will monitor during weekly outpatient infusions will need to discuss with pulmonary, otherwise may need to touch base with community rheumatologist in [place] prior to discharge.  Pending G6PD, if normal start Bactrim or dapsone for PJP prophylaxisSupportive care with albuterol nebs, flutter valve, incentive spirometry.     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  Significant improvement in right eye redness, no changes in vision. Findings discussed with Dr. [name] [facility]) on 12/12 who recommended artificial tears and follow up with Dr. Manuella Ghazi [facility]) on discharge. Given patient has no changes in vision these recs were given over phone, will need to re-consult if any changes in vision.  Discussed with ophthalmology (Dr. [name]) on 12/13 getting formal eye exam for accurate baseline, still pending.   #Pansinusitis.  CT head shows lobulated areas of patchy mucosal thickening in right maxillary, bilateral anterior ethmoid and left frontal sinus and confirmed on dedicated sinus CT. ENT agrees with initiation of medical therapy as long as vision remains intact.  Will follow up as an outpatient, nasal saline to reduce dryness or bleeding and crusting in the nasal passageway  #Sepsis syndrome, present on admission, resolved. Presented with tachypnea, elevated white count and concern bilateral airspace disease initial imaging concerning for pneumonia with sepsis syndrome.  #Leukocytosis.  Previously resolving but now uptrending likely in the setting of IV steroids.  We will continue to monitor as patient remains afebrile.  #Left apical pulmonary nodule.   Status post CT-guided lung biopsy on 12/11 by IR,  consistent with granulomatous disease as mentioned above  #  ESRD on HD, Monday Wednesday Friday.  Appreciate nephrology following and managing dialysis sessions.  Continue PhosLo 3 times daily  #Type 2 diabetes.  A1c 4.8%.  Holding home Levemir, monitor CBGs on sliding scale insulin as needed low A1c do not suspect he will need to continue Levemir on discharge  #Schizophrenia, stable.  Continue home Risperdal and trazodone  #History of granulomatosis polyangiitis with renal disease.  Diagnosed in 2010 (previously treated with Cytoxan and steroids, last hospitalization for flare in 2014 with symptoms of epistasis, renal failure and alveolar hemorrhage renal CT scan) .  #Peripheral neuropathy in lower extremities.  Continue home gabapentin  #Normocytic anemia, anemia of chronic disease (ESRD).  Hemoglobin stable at baseline.  Code Status: Full code  Family Communication: Last discussed with brother on 12/16.  Disposition Plan: Rituxan infusion on 12/17 arranging short stay at [facility] to continue Rituxan infusion as outpatient, ensure follow-up with ENT, ophthalmology.  Consultants:  Nephrology  ENT(12/12, Dr. Rosen)12/14 Dr. Jerrell Belfast  Ophthalmology 12/12 (Dr. [name], [facility]),   Pulmonary (12/12--on)  Procedures:  Ct guided left upper lobe pulm nodule biopsy 12/11-positive for necrotizing granulomatous inflammation with neutrophilic microabscesses  Prophylaxis: Heparin subcutaneous  Antimicrobials: Anti-infectives (From admission, onward)   Start     Dose/Rate Route Frequency Ordered Stop   10/05/18 1200  vancomycin (VANCOCIN) IVPB 750 mg/150 ml premix  Status:  Discontinued     750 mg 150 mL/hr over 60 Minutes Intravenous Every M-W-F (Hemodialysis) 10/04/18 2049 10/10/18 1616   10/04/18 2100  ceFEPIme (MAXIPIME) 1 g in sodium chloride 0.9 % 100 mL IVPB  Status:  Discontinued     1 g 200 mL/hr over 30 Minutes Intravenous Every 24 hours 10/04/18  2039 10/11/18 0736   10/04/18 2100  vancomycin (VANCOCIN) 1,750 mg in sodium chloride 0.9 % 500 mL IVPB     1,750 mg 250 mL/hr over 120 Minutes Intravenous  Once 10/04/18 2047 10/05/18 0137      Cultures:  12/10 x 1 blood culture, no growth, 12/8 x 2 blood cultures no growth to date  Telemetry: None  DVT prophylaxis: Heparin prophylaxis   Objective: Vitals:   10/13/18 1845 10/13/18 1923 10/13/18 2119 10/13/18 2230  BP: (!) 159/74 (!) 161/81 131/69 133/66  Pulse: 75 88 79 75  Resp: 16  18   Temp: 97.9 F (36.6 C) 97.8 F (36.6 C) 98.1 F (36.7 C)   TempSrc: Oral Oral Oral   SpO2:   100%   Weight:      Height:        Intake/Output Summary (Last 24 hours) at 10/13/2018 2258 Last data filed at 10/13/2018 2002 Gross per 24 hour  Intake 872 ml  Output -  Net 872 ml   Filed Weights   10/09/18 1106 10/12/18 0655 10/12/18 1102  Weight: 75.9 kg 78.4 kg 77.4 kg    Exam:  Constitutional: Normal appearing male, lying in bed, no distress Eyes: Improving redness in right eye, less pronounced proptosis in left eye, decreased swelling around orbit, EOMI, able to pass finger exam ENMT: No sinus tenderness, Oropharynx with moist mucous membranes Cardiovascular:  no peripheral edema Respiratory: Room air, clear breath sounds, normal respiratory effort Abdomen: Soft,non-tender, normal bowel sounds Skin: No rash ulcers, or lesions. Without skin tenting  Neurologic: Grossly no focal neuro deficit. Psychiatric:Appropriate affect, and mood.   Data Reviewed: CBC: Recent Labs  Lab 10/09/18 0709 10/10/18 0812 10/11/18 0745 10/12/18 0736 10/13/18 0656  WBC 11.8* 11.5* 17.0* 14.0* 15.7*  HGB 8.7* 9.7* 8.4* 7.9* 8.5*  HCT 27.8* 31.6* 27.6* 25.2* 27.5*  MCV 98.9 98.1 96.5 96.2 95.5  PLT 242 289 286 270 676   Basic Metabolic Panel: Recent Labs  Lab 10/07/18 0148 10/08/18 0142 10/09/18 0308 10/10/18 0812 10/12/18 0736 10/13/18 0656  NA 138 138 138 138 134* 134*  K 4.4  3.9 4.5 4.7 4.9 4.4  CL 95* 96* 96* 94* 90* 91*  CO2 26 26 22 22  18* 23  GLUCOSE 124* 105* 116* 142* 222* 177*  BUN 68* 29* 68* 59* 163* 86*  CREATININE 11.73* 6.54* 10.15* 7.56* 13.49* 8.08*  CALCIUM 9.1 8.5* 9.0 9.8 9.5 9.0  MG 2.4 2.1 2.5*  --   --   --   PHOS  --   --   --   --  9.5*  --    GFR: Estimated Creatinine Clearance: 10.7 mL/min (A) (by C-G formula based on SCr of 8.08 mg/dL (H)). Liver Function Tests: Recent Labs  Lab 10/12/18 0736  ALBUMIN 2.3*   No results for input(s): LIPASE, AMYLASE in the last 168 hours. No results for input(s): AMMONIA in the last 168 hours. Coagulation Profile: No results for input(s): INR, PROTIME in the last 168 hours. Cardiac Enzymes: No results for input(s): CKTOTAL, CKMB, CKMBINDEX, TROPONINI in the last 168 hours. BNP (last 3 results) No results for input(s): PROBNP in the last 8760 hours. HbA1C: No results for input(s): HGBA1C in the last 72 hours. CBG: Recent Labs  Lab 10/12/18 2214 10/13/18 0817 10/13/18 1212 10/13/18 1734 10/13/18 2237  GLUCAP 220* 154* 153* 151* 181*   Lipid Profile: No results for input(s): CHOL, HDL, LDLCALC, TRIG, CHOLHDL, LDLDIRECT in the last 72 hours. Thyroid Function Tests: No results for input(s): TSH, T4TOTAL, FREET4, T3FREE, THYROIDAB in the last 72 hours. Anemia Panel: No results for input(s): VITAMINB12, FOLATE, FERRITIN, TIBC, IRON, RETICCTPCT in the last 72 hours. Urine analysis:    Component Value Date/Time   COLORURINE RED (A) 07/06/2016 1938   APPEARANCEUR TURBID (A) 07/06/2016 1938   LABSPEC 1.020 07/06/2016 1938   PHURINE 7.5 07/06/2016 1938   GLUCOSEU NEGATIVE 07/06/2016 1938   HGBUR LARGE (A) 07/06/2016 1938   BILIRUBINUR NEGATIVE 07/06/2016 1938   KETONESUR NEGATIVE 07/06/2016 1938   PROTEINUR >300 (A) 07/06/2016 1938   UROBILINOGEN 0.2 11/04/2014 0952   NITRITE NEGATIVE 07/06/2016 1938   LEUKOCYTESUR SMALL (A) 07/06/2016 1938   Sepsis  Labs: @LABRCNTIP (procalcitonin:4,lacticidven:4)  ) Recent Results (from the past 240 hour(s))  Culture, blood (routine x 2)     Status: None   Collection Time: 10/04/18  8:09 PM  Result Value Ref Range Status   Specimen Description BLOOD LEFT ARM  Final   Special Requests   Final    BOTTLES DRAWN AEROBIC AND ANAEROBIC Blood Culture adequate volume   Culture   Final    NO GROWTH 5 DAYS Performed at [facility], [place] [address] [place], [place] [address]    Report Status 10/09/2018 FINAL  Final  Culture, blood (routine x 2)     Status: None   Collection Time: 10/04/18  8:50 PM  Result Value Ref Range Status   Specimen Description BLOOD LEFT FOREARM  Final   Special Requests   Final    BOTTLES DRAWN AEROBIC AND ANAEROBIC Blood Culture adequate volume   Culture   Final    NO GROWTH 5 DAYS Performed at [facility], [place] [address] [place], [place] [address]    Report  Status 10/09/2018 FINAL  Final  Culture, blood (Routine X 2) w Reflex to ID Panel     Status: None   Collection Time: 10/06/18  4:30 PM  Result Value Ref Range Status   Specimen Description BLOOD LEFT HAND  Final   Special Requests   Final    BOTTLES DRAWN AEROBIC ONLY Blood Culture results may not be optimal due to an inadequate volume of blood received in culture bottles   Culture   Final    NO GROWTH 5 DAYS Performed at [facility], [place] [address], [place], [place] [address]    Report Status 10/13/2018 FINAL  Final  Expectorated sputum assessment w rflx to resp cult     Status: None   Collection Time: 10/08/18  8:16 PM  Result Value Ref Range Status   Specimen Description EXPECTORATED SPUTUM  Final   Special Requests Normal  Final   Sputum evaluation   Final    THIS SPECIMEN IS ACCEPTABLE FOR SPUTUM CULTURE Performed at [facility], [place] [address], [place], [place] [address]    Report Status 10/09/2018 FINAL  Final  Culture, respiratory     Status: None   Collection Time:  10/08/18  8:16 PM  Result Value Ref Range Status   Specimen Description EXPECTORATED SPUTUM  Final   Special Requests Normal Reflexed from [id number]  Final   Gram Stain   Final    ABUNDANT WBC PRESENT,BOTH PMN AND MONONUCLEAR RARE GRAM POSITIVE COCCI RARE GRAM VARIABLE ROD    Culture   Final    FEW Consistent with normal respiratory flora. Performed at [facility], [place] [address] [place], [place] [address]    Report Status 10/11/2018 FINAL  Final  MRSA PCR Screening     Status: None   Collection Time: 10/09/18  1:37 PM  Result Value Ref Range Status   MRSA by PCR NEGATIVE NEGATIVE Final    Comment:        The GeneXpert MRSA Assay (FDA approved for NASAL specimens only), is one component of a comprehensive MRSA colonization surveillance program. It is not intended to diagnose MRSA infection nor to guide or monitor treatment for MRSA infections. Performed at [facility], [place] [address] [place], [place] [address]       Studies: No results found.  Scheduled Meds: . artificial tears   Right Eye Q6H  . calcitRIOL  1 mcg Oral Q M,W,F-HD  . calcium acetate  1,334 mg Oral TID WC  . Chlorhexidine Gluconate Cloth  6 each Topical Q0600  . Chlorhexidine Gluconate Cloth  6 each Topical Q0600  . darbepoetin (ARANESP) injection - DIALYSIS  100 mcg Intravenous Q Mon-HD  . feeding supplement (PRO-STAT SUGAR FREE 64)  30 mL Oral BID  . gabapentin  300 mg Oral BID  . heparin injection (subcutaneous)  5,000 Units Subcutaneous Q8H  . insulin aspart  0-5 Units Subcutaneous QHS  . insulin aspart  0-9 Units Subcutaneous TID WC  . risperiDONE  0.25 mg Oral QHS  . sodium chloride  1 spray Each Nare Q0200  . traZODone  100 mg Oral QHS    Continuous Infusions: . methylPREDNISolone (SOLU-MEDROL) injection 250 mg (10/13/18 2134)     LOS: 9 days     Desiree Hane, MD [facility] Pager [phone]  If 7PM-7AM, please contact  night-coverage [email or web address] Password [facility] 10/13/2018, 10:58 PM

## 2018-10-13 NOTE — Progress Notes (Signed)
RItuxan administration: Spoke with Thuy in pharmacy, she will schedule Rituxan for 1500, with premeds at 1430. Floor RN to administer pre-med.

## 2018-10-13 NOTE — Telephone Encounter (Signed)
Dr. Vaughan Browner has signed the form.   MR, now all we need to know is if you are wanting pt to have pre-medications prior to receiving Rituxan. Please advise. Thanks!

## 2018-10-13 NOTE — Telephone Encounter (Signed)
James Nicholson  This patient needs outpatient rituxan 375mg /meter square - This is weekly x 4 doses. He Keng Dorgan will get his first dose 10/13/2018 or 10/14/18\ as inpatient in 6[facility]. Rest weekly x 3 doses is outpatient. I know in past there are delays in making the inpatient to outpatient swtich with care lost in transition. So, to avoid that   - please do order set for rituxan 375mg /meter square - weekly x 3 doses - first opd dose to  start 10/21/18 - 10/26/18 and then weekly x 3 doses   - get Dr Vaughan Browner (copied) to sign off if he is on office rotation. Otherwise, Tammy Parrett or Wyn Quaker . If no one will, I have to come for hospital to sign. We need to get this done 10/13/2018 or 10/14/18 and then order needs to get faxed to short stay. Note: might need insurance approval   Thanks    SIGNATURE    Dr. Brand Males, M.D., F.C.C.P,  Pulmonary and Critical Care Medicine Staff Physician, [facility] Director - Interstitial Lung Disease  Program  Pulmonary [place] [facility] [place], [place], [address]  Pager: [phone], If no answer or between  15:00h - 7:00h: call [phone] Telephone: [phone]  11:11 AM 10/13/2018

## 2018-10-13 NOTE — Telephone Encounter (Signed)
Yes. I am ok singing

## 2018-10-13 NOTE — Telephone Encounter (Signed)
Severe life threatening anca vasculitis or wegner granulomatosis

## 2018-10-13 NOTE — Evaluation (Signed)
Physical Therapy Evaluation Patient Details Name: James Nicholson MRN: [id number] DOB: [birth year] Today's Date: 10/13/2018   History of Present Illness  52 y.o. male of [place] descent with granulomatous polyangiitis (P-ANCA), ESRD on HD (MWF), IDDM, schizophrenia diagnosis (per chart), hx of VT arrest s/p ICD (2010) who was recently hospitalized for suspected PNA and discharged with PO abx but returns with ongoing dyspnea, worsening cough with blood tinged sputum. admitted on 10/04/2018 with persistent respiratory symptoms concerning for possible pneumonia. Lung nodule is due to wegeners, not organ threatening right now..  found to have orbital mass on CT scan upon evaluation of left eye redness/proptosis.  Clinical Impression  PTA pt living in group home, unsure of layout and independence with iADLs, tried to reach group home to understand house set up and level of supervision available, but no one answered phone. Pt was hospitalized at the beginning of the month and was ambulating without AD and independent with ADLs. Pt currently limited in mobility by dyspnea and coughing with activity and decreased strength and endurance. Pt is mod I for bed mobility and min guard for transfers ambulation with RW and stairs but he is not at PLOF in terms of use of AD and distance ambulated. Given decreased level of supervision and decrease in function PT currently recommending SNF level rehab at discharge. PT will continue to follow acutely.     Follow Up Recommendations Supervision - Intermittent;SNF    Equipment Recommendations  Rolling walker with 5" wheels       Precautions / Restrictions Precautions Precautions: Fall Restrictions Weight Bearing Restrictions: No      Mobility  Bed Mobility Overal bed mobility: Modified Independent                Transfers Overall transfer level: Needs assistance(minguardA) Equipment used: Rolling walker (2 wheeled) Transfers: Sit to/from Stand Sit  to Stand: Min guard         General transfer comment:  min guard assist for safety(min guard for safety )  Ambulation/Gait Ambulation/Gait assistance: Min guard Gait Distance (Feet): 150 Feet Assistive device: None;Rolling walker (2 wheeled) Gait Pattern/deviations: Step-through pattern;Staggering left;Staggering right Gait velocity: decreased Gait velocity interpretation: 1.31 - 2.62 ft/sec, indicative of limited community ambulator General Gait Details: min guard for ambulation with RW, steady, gait without LoB, without AD, pt staggers with gait and exhibits mild instability for 10 feet of ambulation, returned to ambulation with RW   Stairs Stairs: Yes Stairs assistance: Min guard Stair Management: One rail Left;Forwards;Alternating pattern;Step to pattern;One rail Right Number of Stairs: 14 General stair comments: min guard for step over step ascent of 14 steps with rail on R for ascent, 2/4 DoE at top of steps, after short rest break pt descended wtih step to pattern and continued min guard, again exhibits 2/4 DoE at bottom of steps         Balance Overall balance assessment: Needs assistance Sitting-balance support: Feet supported;No upper extremity supported Sitting balance-Leahy Scale: Good     Standing balance support: No upper extremity supported;Bilateral upper extremity supported;Single extremity supported Standing balance-Leahy Scale: Fair Standing balance comment: pt requires at least single UE support for satbility                             Pertinent Vitals/Pain Pain Assessment: Faces Faces Pain Scale: Hurts a little bit Pain Location: R side of abdomen(throat with coughing ) Pain Descriptors / Indicators: Aching;Sore Pain Intervention(s): Monitored during  session    Home Living Family/patient expects to be discharged to:: Unsure     Type of Home: Group Home Home Access: Stairs to enter Entrance Stairs-Rails: None Entrance Stairs-Number  of Steps: 3 Home Layout: Two level Home Equipment: Environmental consultant - 2 wheels Additional Comments: Sometimes he stays with his brother    Prior Function Level of Independence: Independent               Hand Dominance   Dominant Hand: Right    Extremity/Trunk Assessment   Upper Extremity Assessment Upper Extremity Assessment: Overall WFL for tasks assessed    Lower Extremity Assessment Lower Extremity Assessment: Defer to PT evaluation    Cervical / Trunk Assessment Cervical / Trunk Assessment: Normal  Communication   Communication: Prefers language other than English(Serbian)  Cognition Arousal/Alertness: Awake/alert Behavior During Therapy: WFL for tasks assessed/performed Overall Cognitive Status: No family/caregiver present to determine baseline cognitive functioning                                 General Comments: Pt speaks broken english and requires increased time to process comands- not sure if it's due to language barrier or congnitive deficits.(used interpreter)      General Comments General comments (skin integrity, edema, etc.): Pt with underlying cognitive deficits unsure whether he didn't understand due to language or deficits        Assessment/Plan    PT Assessment Patient needs continued PT services  PT Problem List Decreased strength;Decreased activity tolerance;Decreased balance;Decreased mobility;Decreased knowledge of precautions;Decreased safety awareness       PT Treatment Interventions DME instruction;Gait training;Stair training;Functional mobility training;Therapeutic activities;Therapeutic exercise;Balance training;Patient/family education    PT Goals (Current goals can be found in the Care Plan section)  Acute Rehab PT Goals Patient Stated Goal: to stay out of the hospital PT Goal Formulation: With patient Time For Goal Achievement: 10/27/18 Potential to Achieve Goals: Fair    Frequency Min 3X/week   Barriers to  discharge Decreased caregiver support         AM-PAC PT "6 Clicks" Mobility  Outcome Measure Help needed turning from your back to your side while in a flat bed without using bedrails?: None Help needed moving from lying on your back to sitting on the side of a flat bed without using bedrails?: None Help needed moving to and from a bed to a chair (including a wheelchair)?: A Little Help needed standing up from a chair using your arms (e.g., wheelchair or bedside chair)?: A Little Help needed to walk in hospital room?: A Little Help needed climbing 3-5 steps with a railing? : A Little 6 Click Score: 20    End of Session Equipment Utilized During Treatment: Gait belt Activity Tolerance: Patient tolerated treatment well Patient left: in chair;with call bell/phone within reach;with chair alarm set Nurse Communication: Mobility status PT Visit Diagnosis: Muscle weakness (generalized) (M62.81);Difficulty in walking, not elsewhere classified (R26.2)    Time: 2440-1027 PT Time Calculation (min) (ACUTE ONLY): 22 min   Charges:   PT Evaluation $PT Eval Moderate Complexity: 1 Mod          Maya Arcand B. Migdalia Dk PT, DPT Acute Rehabilitation Services Pager [phone] Office [phone]   [place] 10/13/2018, 2:47 PM

## 2018-10-14 LAB — GLUCOSE, CAPILLARY
Glucose-Capillary: 174 mg/dL — ABNORMAL HIGH (ref 70–99)
Glucose-Capillary: 142 mg/dL — ABNORMAL HIGH (ref 70–99)
Glucose-Capillary: 141 mg/dL — ABNORMAL HIGH (ref 70–99)
Glucose-Capillary: 162 mg/dL — ABNORMAL HIGH (ref 70–99)

## 2018-10-14 LAB — CBC
HCT: 26.3 % — ABNORMAL LOW (ref 39.0–52.0)
Hemoglobin: 8.6 g/dL — ABNORMAL LOW (ref 13.0–17.0)
MCH: 31.4 pg (ref 26.0–34.0)
MCHC: 32.7 g/dL (ref 30.0–36.0)
MCV: 96 fL (ref 80.0–100.0)
Platelets: 296 10*3/uL (ref 150–400)
RBC: 2.74 MIL/uL — ABNORMAL LOW (ref 4.22–5.81)
RDW: 14.9 % (ref 11.5–15.5)
WBC: 15.2 10*3/uL — ABNORMAL HIGH (ref 4.0–10.5)
nRBC: 0.9 % — ABNORMAL HIGH (ref 0.0–0.2)

## 2018-10-14 LAB — RENAL FUNCTION PANEL
Albumin: 2.3 g/dL — ABNORMAL LOW (ref 3.5–5.0)
Anion gap: 23 — ABNORMAL HIGH (ref 5–15)
BUN: 144 mg/dL — ABNORMAL HIGH (ref 6–20)
CO2: 18 mmol/L — ABNORMAL LOW (ref 22–32)
Calcium: 8.7 mg/dL — ABNORMAL LOW (ref 8.9–10.3)
Chloride: 91 mmol/L — ABNORMAL LOW (ref 98–111)
Creatinine, Ser: 10.69 mg/dL — ABNORMAL HIGH (ref 0.61–1.24)
GFR calc Af Amer: 6 mL/min — ABNORMAL LOW (ref >60)
GFR calc non Af Amer: 5 mL/min — ABNORMAL LOW (ref >60)
Glucose, Bld: 177 mg/dL — ABNORMAL HIGH (ref 70–99)
Phosphorus: 10 mg/dL — ABNORMAL HIGH (ref 2.5–4.6)
Potassium: 4.4 mmol/L (ref 3.5–5.1)
Sodium: 132 mmol/L — ABNORMAL LOW (ref 135–145)

## 2018-10-14 LAB — GLUCOSE 6 PHOSPHATE DEHYDROGENASE
G6PDH: 13.6 U/g{Hb} — ABNORMAL HIGH (ref 4.6–13.5)
Hemoglobin: 8 g/dL — ABNORMAL LOW (ref 13.0–17.7)

## 2018-10-14 LAB — HEPATITIS B SURFACE ANTIGEN: Hepatitis B Surface Ag: NEGATIVE

## 2018-10-14 MED ORDER — PREDNISONE 50 MG PO TABS
60.0000 mg | ORAL_TABLET | Freq: Every day | ORAL | Status: DC
  Administered 2018-10-15 – 2018-10-16 (×2): 60 mg via ORAL
  Filled 2018-10-14 (×2): qty 1

## 2018-10-14 MED ORDER — SODIUM CHLORIDE 0.9 % IV SOLN: 100.0000 mL | INTRAVENOUS | Status: DC | PRN

## 2018-10-14 MED ORDER — HEPARIN SODIUM (PORCINE) 1000 UNIT/ML DIALYSIS
20.0000 [IU]/kg | INTRAMUSCULAR | Status: DC | PRN
  Administered 2018-10-14: 1500 [IU] via INTRAVENOUS_CENTRAL
  Filled 2018-10-14 (×2): qty 2

## 2018-10-14 MED ORDER — LIDOCAINE-PRILOCAINE 2.5-2.5 % EX CREA
1.0000 "application " | TOPICAL_CREAM | CUTANEOUS | Status: DC | PRN
  Filled 2018-10-14: qty 5

## 2018-10-14 MED ORDER — PENTAFLUOROPROP-TETRAFLUOROETH EX AERO: 1.0000 "application " | INHALATION_SPRAY | CUTANEOUS | Status: DC | PRN

## 2018-10-14 MED ORDER — HEPARIN SODIUM (PORCINE) 1000 UNIT/ML DIALYSIS
1000.0000 [IU] | INTRAMUSCULAR | Status: DC | PRN
  Filled 2018-10-14: qty 1

## 2018-10-14 MED ORDER — CALCITRIOL 0.5 MCG PO CAPS
ORAL_CAPSULE | ORAL | Status: AC
  Administered 2018-10-14: 1 ug via ORAL
  Filled 2018-10-14: qty 2

## 2018-10-14 MED ORDER — LIDOCAINE HCL (PF) 1 % IJ SOLN: 5.0000 mL | INTRAMUSCULAR | Status: DC | PRN

## 2018-10-14 MED ORDER — HEPARIN SODIUM (PORCINE) 1000 UNIT/ML IJ SOLN
INTRAMUSCULAR | Status: AC
  Filled 2018-10-14: qty 2

## 2018-10-14 NOTE — Progress Notes (Signed)
PROGRESS NOTE    Aziel Morgan  XWR:[id number] DOB: 13-May-1966 DOA: 10/04/2018 PCP: [name], MD    Brief Narrative:  52 y.o. year old male of [place] descent with medical history significant for ESRD on HD, ANCA granulomatosis polyangiitis (diagnosed 2010), schizophrenia, history of VT arrest status post ICD (2010), history of latent TB (status post 9 months of isoniazid 04/2012) who presented on 10/04/2018 with worsening cough, dyspnea and blood-tinged sputum after recent hospitalization and discharge (12/1-12/02/2018) for presumed H CAP for which he empirically received IV ceftriaxone and azithromycin and was discharged on cefdinir and azithromycin to complete 7-day course.  Hospital course complicated by: found to have orbital mass on CT scan upon evaluation of left eye redness/proptosis. Concerning for granuloma related to Wegener's. Patient still has intact vision so recommended to continue artificial tears per ophthalmology.  IR had obtained biopsy of pulmonary nodule initially concerning for malignancy but found to be consistent with granulomatosis.  Patient was started on IV Solu-Medrol pulse dosing by direction of pulmonary consultants will previously consulted due to poor clinical response with broad-spectrum antibiotics with presentation was initially thought to be related to potential pneumonia.  He did complete 7 full days of IV cefepime.  He has had significant improvement in his cough, shortness of breath, eye swelling and eye redness since initiating Solu-Medrol dosing.  Rituxan infusion was started on 12/17.    Assessment & Plan:   Active Problems:   H/o Ventricular fibrillation Arrest now with AICD   Hypertension   Automatic implantable cardioverter-defibrillator in situ   PPD positive - completed 9 months INH (per Dr. Linus Salmons)   Pneumonia   Wegener's disease, pulmonary ([place])   ESRD (end stage renal disease) ([place])   Lung nodules--left upper lobe and left posterior  lobe- need to rule out malignancy   Orbital mass   Sinusitis   Redness of right eye   Ocular proptosis   Pulmonary nodule, left   Cough  #Acute hypoxic respiratory failure related to bilateral basilar opacities secondary to active Wegener's flare, continues to improve.  Currently on room air.  Completed solumedrol course and first dose of Rituxan infusion to start on 12/17 under pulmonary guidance. ultation, Case manager assisting with arranging outpatient Rituxan infusion( will need weekly for 4 weeks) with short stay at [facility]. Now on prednisone taper per Pulmonary. 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  Significant improvement in right eye redness, no changes in vision. Findings earlier discussed with Dr. [name] [facility]) on 12/12 who recommended artificial tears and follow up with Dr. Manuella Ghazi [facility]) on discharge. Given patient has no changes in vision these recs were given over phone, will need to re-consult if any changes in vision.  Discussed with ophthalmology (Dr. [name]) on 12/13 getting formal eye exam for accurate baseline, still pending.   #Pansinusitis.  CT head shows lobulated areas of patchy mucosal thickening in right maxillary, bilateral anterior ethmoid and left frontal sinus and confirmed on dedicated sinus CT. ENT agrees with initiation of medical therapy as long as vision remains intact.  Will follow up as an outpatient, nasal saline to reduce dryness or bleeding and crusting in the nasal passageway. Stable at this time  #Sepsis ruled out. Tachypnea likely related to vasculitic disease. WBC remains stable, slightly higher in setting of high dosed steroids  #Leukocytosis.  Previously resolving but  now uptrending likely in the setting of IV steroids.  We will continue to monitor as  patient remains afebrile. Stable at present  #Left apical pulmonary nodule.   Status post CT-guided lung biopsy on 12/11 by IR, consistent with granulomatous disease as mentioned above, currently stable  #ESRD on HD, Monday Wednesday Friday.  Appreciate nephrology following and managing dialysis sessions.  Continue PhosLo 3 times daily. Stable . Seen on HD today  #Type 2 diabetes.  A1c 4.8%.  Holding home Levemir, monitor CBGs on sliding scale   #Schizophrenia, stable.   -Continue home Risperdal and trazodone -Stable at present  #History of granulomatosis polyangiitis with renal disease.   -Diagnosed in 2010 (previously treated with Cytoxan and steroids, last hospitalization for flare in 2014 with symptoms of epistasis, renal failure and alveolar hemorrhage renal CT scan) .  #Peripheral neuropathy in lower extremities.   -Continue home gabapentin -Presently stable  #Normocytic anemia, anemia of chronic disease (ESRD).   -Hemodynamically stable at this time   DVT prophylaxis: Heparin subQ Code Status: Full Family Communication: Pt in room, family at bedside Disposition Plan: Uncertain at this time  Consultants:   Pulmonary  Procedures:     Antimicrobials: Anti-infectives (From admission, onward)   Start     Dose/Rate Route Frequency Ordered Stop   10/05/18 1200  vancomycin (VANCOCIN) IVPB 750 mg/150 ml premix  Status:  Discontinued     750 mg 150 mL/hr over 60 Minutes Intravenous Every M-W-F (Hemodialysis) 10/04/18 2049 10/10/18 1616   10/04/18 2100  ceFEPIme (MAXIPIME) 1 g in sodium chloride 0.9 % 100 mL IVPB  Status:  Discontinued     1 g 200 mL/hr over 30 Minutes Intravenous Every 24 hours 10/04/18 2039 10/11/18 0736   10/04/18 2100  vancomycin (VANCOCIN) 1,750 mg in sodium chloride 0.9 % 500 mL IVPB     1,750 mg 250 mL/hr over 120 Minutes Intravenous  Once 10/04/18 2047 10/05/18 0137       Subjective: Without complaints at this  time  Objective: Vitals:   10/14/18 1600 10/14/18 1615 10/14/18 1630 10/14/18 1645  BP: 126/67 125/67 123/63 120/69  Pulse: 79 82 78 90  Resp:      Temp:      TempSrc:      SpO2:      Weight:      Height:        Intake/Output Summary (Last 24 hours) at 10/14/2018 1658 Last data filed at 10/14/2018 0457 Gross per 24 hour  Intake 240 ml  Output -  Net 240 ml   Filed Weights   10/12/18 0655 10/12/18 1102 10/14/18 1255  Weight: 78.4 kg 77.4 kg 79.7 kg    Examination:  General exam: Appears calm and comfortable  Respiratory system: Clear to auscultation. Respiratory effort normal. Cardiovascular system: S1 & S2 heard, RRR Gastrointestinal system: Abdomen is nondistended, soft and nontender. No organomegaly or masses felt. Normal bowel sounds heard. Central nervous system: Alert and oriented. No focal neurological deficits. Extremities: Symmetric 5 x 5 power. Skin: No rashes, lesions  Psychiatry: Judgement and insight appear normal. Mood & affect appropriate.   Data Reviewed: I have personally reviewed following labs and imaging studies  CBC: Recent Labs  Lab 10/10/18 0812 10/11/18 0745 10/12/18 0736 10/13/18 0656 10/14/18 1307  WBC 11.5* 17.0* 14.0* 15.7* 15.2*  HGB 9.7* 8.4* 7.9* 8.5* 8.6*  HCT 31.6* 27.6* 25.2* 27.5* 26.3*  MCV 98.1 96.5 96.2 95.5 96.0  PLT 289 286 270 296 296   Basic  Metabolic Panel: Recent Labs  Lab 10/08/18 0142 10/09/18 0308 10/10/18 0812 10/12/18 0736 10/13/18 0656 10/14/18 1307  NA 138 138 138 134* 134* 132*  K 3.9 4.5 4.7 4.9 4.4 4.4  CL 96* 96* 94* 90* 91* 91*  CO2 26 22 22  18* 23 18*  GLUCOSE 105* 116* 142* 222* 177* 177*  BUN 29* 68* 59* 163* 86* 144*  CREATININE 6.54* 10.15* 7.56* 13.49* 8.08* 10.69*  CALCIUM 8.5* 9.0 9.8 9.5 9.0 8.7*  MG 2.1 2.5*  --   --   --   --   PHOS  --   --   --  9.5*  --  10.0*   GFR: Estimated Creatinine Clearance: 8.1 mL/min (A) (by C-G formula based on SCr of 10.69 mg/dL (H)). Liver  Function Tests: Recent Labs  Lab 10/12/18 0736 10/14/18 1307  ALBUMIN 2.3* 2.3*   No results for input(s): LIPASE, AMYLASE in the last 168 hours. No results for input(s): AMMONIA in the last 168 hours. Coagulation Profile: No results for input(s): INR, PROTIME in the last 168 hours. Cardiac Enzymes: No results for input(s): CKTOTAL, CKMB, CKMBINDEX, TROPONINI in the last 168 hours. BNP (last 3 results) No results for input(s): PROBNP in the last 8760 hours. HbA1C: No results for input(s): HGBA1C in the last 72 hours. CBG: Recent Labs  Lab 10/13/18 1212 10/13/18 1734 10/13/18 2237 10/14/18 0820 10/14/18 1218  GLUCAP 153* 151* 181* 174* 142*   Lipid Profile: No results for input(s): CHOL, HDL, LDLCALC, TRIG, CHOLHDL, LDLDIRECT in the last 72 hours. Thyroid Function Tests: No results for input(s): TSH, T4TOTAL, FREET4, T3FREE, THYROIDAB in the last 72 hours. Anemia Panel: No results for input(s): VITAMINB12, FOLATE, FERRITIN, TIBC, IRON, RETICCTPCT in the last 72 hours. Sepsis Labs: No results for input(s): PROCALCITON, LATICACIDVEN in the last 168 hours.  Recent Results (from the past 240 hour(s))  Culture, blood (routine x 2)     Status: None   Collection Time: 10/04/18  8:09 PM  Result Value Ref Range Status   Specimen Description BLOOD LEFT ARM  Final   Special Requests   Final    BOTTLES DRAWN AEROBIC AND ANAEROBIC Blood Culture adequate volume   Culture   Final    NO GROWTH 5 DAYS Performed at [facility], [address], [place], [place] [address]    Report Status 10/09/2018 FINAL  Final  Culture, blood (routine x 2)     Status: None   Collection Time: 10/04/18  8:50 PM  Result Value Ref Range Status   Specimen Description BLOOD LEFT FOREARM  Final   Special Requests   Final    BOTTLES DRAWN AEROBIC AND ANAEROBIC Blood Culture adequate volume   Culture   Final    NO GROWTH 5 DAYS Performed at [facility], [place] [address], [place], [place]  [address]    Report Status 10/09/2018 FINAL  Final  Culture, blood (Routine X 2) w Reflex to ID Panel     Status: None   Collection Time: 10/06/18  4:30 PM  Result Value Ref Range Status   Specimen Description BLOOD LEFT HAND  Final   Special Requests   Final    BOTTLES DRAWN AEROBIC ONLY Blood Culture results may not be optimal due to an inadequate volume of blood received in culture bottles   Culture   Final    NO GROWTH 5 DAYS Performed at [facility], [place] [address], [place], [place] [address]  Report Status 10/13/2018 FINAL  Final  Expectorated sputum assessment w rflx to resp cult     Status: None   Collection Time: 10/08/18  8:16 PM  Result Value Ref Range Status   Specimen Description EXPECTORATED SPUTUM  Final   Special Requests Normal  Final   Sputum evaluation   Final    THIS SPECIMEN IS ACCEPTABLE FOR SPUTUM CULTURE Performed at [facility], [address] [place], [place] [address]    Report Status 10/09/2018 FINAL  Final  Culture, respiratory     Status: None   Collection Time: 10/08/18  8:16 PM  Result Value Ref Range Status   Specimen Description EXPECTORATED SPUTUM  Final   Special Requests Normal Reflexed from [id number]  Final   Gram Stain   Final    ABUNDANT WBC PRESENT,BOTH PMN AND MONONUCLEAR RARE GRAM POSITIVE COCCI RARE GRAM VARIABLE ROD    Culture   Final    FEW Consistent with normal respiratory flora. Performed at [facility], [place] [address] [place], [place] [address]    Report Status 10/11/2018 FINAL  Final  MRSA PCR Screening     Status: None   Collection Time: 10/09/18  1:37 PM  Result Value Ref Range Status   MRSA by PCR NEGATIVE NEGATIVE Final    Comment:        The GeneXpert MRSA Assay (FDA approved for NASAL specimens only), is one component of a comprehensive MRSA colonization surveillance program. It is not intended to diagnose MRSA infection nor to guide or monitor treatment for MRSA infections. Performed at  [facility], [place] [address] [place], [place] [address]      Radiology Studies: No results found.  Scheduled Meds: . artificial tears   Right Eye Q6H  . calcitRIOL  1 mcg Oral Q M,W,F-HD  . calcium acetate  1,334 mg Oral TID WC  . Chlorhexidine Gluconate Cloth  6 each Topical Q0600  . Chlorhexidine Gluconate Cloth  6 each Topical Q0600  . darbepoetin (ARANESP) injection - DIALYSIS  100 mcg Intravenous Q Mon-HD  . feeding supplement (PRO-STAT SUGAR FREE 64)  30 mL Oral BID  . gabapentin  300 mg Oral BID  . heparin injection (subcutaneous)  5,000 Units Subcutaneous Q8H  . insulin aspart  0-5 Units Subcutaneous QHS  . insulin aspart  0-9 Units Subcutaneous TID WC  . [START ON 10/15/2018] predniSONE  60 mg Oral Q breakfast  . risperiDONE  0.25 mg Oral QHS  . sodium chloride  1 spray Each Nare Q0200  . traZODone  100 mg Oral QHS   Continuous Infusions: . [START ON 10/15/2018] sodium chloride    . [START ON 10/15/2018] sodium chloride       LOS: 10 days   Marylu Lund, MD [facility] Pager On Amion  If 7PM-7AM, please contact night-coverage 10/14/2018, 4:58 PM

## 2018-10-14 NOTE — NC FL2 (Signed)
[place] LEVEL OF CARE SCREENING TOOL     IDENTIFICATION  Patient Name: James Nicholson Birthdate: [birth year] Sex: male Admission Date (Current Location): 10/04/2018  [facility] and [place] Number:  Herbalist and Address:  The . [facility], [place] [address] [place], [place] [address]      Provider Number: [id number]  Attending Physician Name and Address:  Donne Hazel, MD  Relative Name and Phone Number:       Current Level of Care: Hospital Recommended Level of Care: [place] Prior Approval Number:    Date Approved/Denied:   PASRR Number: [id number]  Discharge Plan: SNF    Current Diagnoses: Patient Active Problem List   Diagnosis Date Noted  . Orbital mass 10/08/2018  . Sinusitis 10/08/2018  . Redness of right eye 10/08/2018  . Ocular proptosis 10/08/2018  . Pulmonary nodule, left 10/08/2018  . Cough   . Lung nodules--left upper lobe and left posterior lobe- need to rule out malignancy 10/05/2018  . Conjunctivitis 09/27/2018  . Chronic kidney disease   . Hyperlipidemia   . Sepsis ([place]) 11/03/2014  . IDDM (insulin dependent diabetes mellitus) ([place]) 11/03/2014  . Troponin level elevated 12/22/2013    Class: Acute  . Mechanical complication of other vascular device, implant, and graft 08/04/2013  . Other complications due to renal dialysis device, implant, and graft 08/04/2013  . ESRD (end stage renal disease) ([place]) 04/13/2013  . Anemia in chronic kidney disease 02/05/2013  . Pneumonia 02/05/2013  . GI bleed 02/05/2013  . Metabolic acidosis 88/08/314  . Wegener's disease, pulmonary ([place]) 02/05/2013  . PPD positive - completed 9 months INH (per Dr. Linus Salmons) 05/25/2012  . Other primary cardiomyopathies 05/07/2012  . H/o Ventricular fibrillation Arrest now with AICD 05/07/2012    Class: History of  . Hypertension 05/07/2012  . Automatic implantable cardioverter-defibrillator in situ 05/07/2012     Orientation RESPIRATION BLADDER Height & Weight     Self, Time, Situation, Place  Normal Continent Weight: 170 lb 10.2 oz (77.4 kg) Height:  5\' 9"  (175.3 cm)  BEHAVIORAL SYMPTOMS/MOOD NEUROLOGICAL BOWEL NUTRITION STATUS      Continent Diet(see discharge summary)  AMBULATORY STATUS COMMUNICATION OF NEEDS Skin   Limited Assist Verbally Surgical wounds(closed incision on left upper back)                       Personal Care Assistance Level of Assistance  Bathing, Feeding, Dressing Bathing Assistance: Limited assistance Feeding assistance: Independent Dressing Assistance: Limited assistance     Functional Limitations Info  Sight, Hearing, Speech Sight Info: Impaired Hearing Info: Adequate Speech Info: Adequate    SPECIAL CARE FACTORS FREQUENCY  PT (By licensed PT), OT (By licensed OT)     PT Frequency: 5x week OT Frequency: 5x week            Contractures Contractures Info: Not present    Additional Factors Info  Code Status, Allergies, Psychotropic, Insulin Sliding Scale Code Status Info: Full Code Allergies Info: No Known Allergies  Psychotropic Info: risperiDONE (RISPERDAL) tablet 0.25 mg daily at bedtime PO; traZODone (DESYREL) tablet 100 mg daily at bedtime Insulin Sliding Scale Info: insulin aspart (novoLOG) injection 0-5 Units daily at bedtime; insulin aspart (novoLOG) injection 0-9 Units 3x daily with meals       Current Medications (10/14/2018):  This is the current hospital active medication list Current Facility-Administered Medications  Medication Dose Route Frequency Provider Last Rate Last Dose  .  acetaminophen (TYLENOL) tablet 650 mg  650 mg Oral Q6H PRN Schorr, Rhetta Mura, NP   650 mg at 10/13/18 1429  . albuterol (PROVENTIL) (2.5 MG/3ML) 0.083% nebulizer solution 2.5 mg  2.5 mg Nebulization Q4H PRN Desiree Hane, MD      . artificial tears (LACRILUBE) ophthalmic ointment   Right Eye Q6H Oretha Milch D, MD      . calcitRIOL (ROCALTROL)  capsule 1 mcg  1 mcg Oral Q M,W,F-HD Roney Jaffe, MD   1 mcg at 10/12/18 1110  . calcium acetate (PHOSLO) capsule 1,334 mg  1,334 mg Oral TID WC Park, Derenda Mis, MD   1,334 mg at 10/14/18 0854  . Chlorhexidine Gluconate Cloth 2 % PADS 6 each  6 each Topical Q0600 Roney Jaffe, MD   6 each at 10/09/18 0539  . Chlorhexidine Gluconate Cloth 2 % PADS 6 each  6 each Topical Q0600 Roney Jaffe, MD   6 each at 10/13/18 0636  . Darbepoetin Alfa (ARANESP) injection 100 mcg  100 mcg Intravenous Q Mon-HD Roney Jaffe, MD   100 mcg at 10/12/18 1113  . feeding supplement (PRO-STAT SUGAR FREE 64) liquid 30 mL  30 mL Oral BID Loren Racer, PA-C   30 mL at 10/14/18 1013  . gabapentin (NEURONTIN) capsule 300 mg  300 mg Oral BID Colbert Ewing, MD   300 mg at 10/14/18 1013  . guaiFENesin-dextromethorphan (ROBITUSSIN DM) 100-10 MG/5ML syrup 10 mL  10 mL Oral Q4H PRN Colbert Ewing, MD   10 mL at 10/12/18 2249  . heparin injection 5,000 Units  5,000 Units Subcutaneous Q8H Oretha Milch D, MD   5,000 Units at 10/14/18 0553  . insulin aspart (novoLOG) injection 0-5 Units  0-5 Units Subcutaneous QHS Roxan Hockey, MD   2 Units at 10/12/18 2239  . insulin aspart (novoLOG) injection 0-9 Units  0-9 Units Subcutaneous TID WC Roxan Hockey, MD   2 Units at 10/14/18 0853  . ipratropium-albuterol (DUONEB) 0.5-2.5 (3) MG/3ML nebulizer solution 3 mL  3 mL Nebulization Q4H PRN Oretha Milch D, MD      . methylPREDNISolone sodium succinate (SOLU-MEDROL) 250 mg in sodium chloride 0.9 % 50 mL IVPB  250 mg Intravenous Q6H Brand Males, MD 104 mL/hr at 10/14/18 0855 250 mg at 10/14/18 0855  . ondansetron (ZOFRAN) injection 4 mg  4 mg Intravenous Q6H PRN Denton Brick, Courage, MD   4 mg at 10/09/18 1314  . risperiDONE (RISPERDAL) tablet 0.25 mg  0.25 mg Oral QHS Park, Derenda Mis, MD   0.25 mg at 10/13/18 2124  . sodium chloride (OCEAN) 0.65 % nasal spray 1 spray  1 spray Each Nare Q0200 Oretha Milch D, MD   1  spray at 10/14/18 0858  . traZODone (DESYREL) tablet 100 mg  100 mg Oral QHS Colbert Ewing, MD   100 mg at 10/13/18 2124     Discharge Medications: Please see discharge summary for a list of discharge medications.  Relevant Imaging Results:  Relevant Lab Results:   Additional Information SS#[phone]; Dialysis MWF at [facility]; will need infusion of Rituximab at short stay 1x week for 3 more weeks this has been arranged  Alexander Mt, LCSWA

## 2018-10-14 NOTE — Telephone Encounter (Signed)
Called and spoke with Laverne at short stay to see if they did receive the fax for pt to have Rituxan infusions once out of the hospital. Per Otilio Saber, they did receive the fax. Laverne stated to me they could get pt on the schedule for the next Rituxan infusion 10/22/18 at 9am.  I stated to [place] to go ahead and place pt down on the schedule.  MR, can you please make sure this gets added to his paperwork for discharge so that it is on there.  The next rituxan will be 12/26 at [place] over at Short Stay at [facility].   MR, please advise if you also want me to go ahead and schedule pt an appt to follow up after hospitalization and just let you know. Thanks!

## 2018-10-14 NOTE — Social Work (Addendum)
1:59pm- CSW spoke with Dr. [name] regarding pt f/u appointments, his next short stay infusion will be on 12/26 at 9am. He will also have pulmonary f/u appointments. [facility] able to accept pt when medically appropriate.  1:26pm- Met with pt brother Velcro, we discussed recommendations and he is in agreement for SNF rehab for pt. He has been to [facility] before and pt brother prefers if he could discharge there again.   Pt brother also requested that pt not be moved tomorrow if at all possible due to religious observance for family.   CSW f/u with admissions liaison at [facility] regarding referral, made her aware that pt would need transportation to dialysis and infusion appointments at short stay.  Westley Hummer, MSW, [place] Work [phone]

## 2018-10-14 NOTE — Progress Notes (Addendum)
NAMELyndon Nicholson, MRN:  [id number], DOB:  [birth year], LOS: 10 ADMISSION DATE:  10/04/2018, CONSULTATION DATE:  10/08/18 REFERRING MD:  Theodoro Grist MD, CHIEF COMPLAINT: Slow to resolve pneumonia.  Brief History   52 year old with history of Wegener's disease, renal failure.  Biopsy-proven ANCA positive necrotizing glomerulonephritis.Former smoker , quit 2010, no pack year history documented. Moved to [place] from [place] in 2010. ESRD on HD since 2014 or 2016  Recent admission from 12/1/5 to 10/01/2018 for pneumonia.  Readmitted on 12/12 for recurrence of symptoms. Underwent CT-guided biopsy of apical lung mass 10/07/18 (NECROTIZING GRANULOMATOUS INFLAMMATION WITH NEUTROPHILIC MICROABSCESSES.).  PCCM consulted 12/12 slow to resolve symptoms.  Results for ZAYDON, KINSER (MRN [id number]) as of 10/12/2018 17:12  Ref. Range 02/05/2013 07:10 10/08/2018 10:42  ANCA Proteinase 3 Latest Ref Range: 0.0 - 3.5 U/mL  71.2 (H)  Myeloperoxidase Abs Latest Ref Range: 0.0 - 9.0 U/mL 3 <9.0  Serine Protease 3 Latest Ref Range: <20 AU/mL 927 (H)   c-ANCA Screen Latest Ref Range: NEGATIVE  POSITIVE (A)   Cytoplasmic (C-ANCA) Latest Ref Range: <1:20  1:320 (H)   p-ANCA Screen Latest Ref Range: NEGATIVE  NEGATIVE   Atypical p-ANCA Screen Latest Ref Range: NEGATIVE  NEGATIVE     Past Medical History  History of atrial fibrillation, cardiomyopathy s/p AICD, ESRD - MWF, schizophrenia, latent TB status post INH therapy in 2014. Biopsy-proven ANCA positive necrotizing glomerulonephritis treated with Cytoxan and steroids in 2010 in [place], admitted in 2014 for alveolar hemorrhage, renal failure requiring Cytoxan and steroids.  [name]  Significant Hospital Events   10/04/2018>> Hospital Admission\ 10/04/2018 CT chest- bilateral patchy airspace disease, 17 cm left apical nodule, aortic atherosclerosi 12/11- CT guided biopsy of apical mass> granulomatous inflammation with  neutrophilic micro-abscesses   12/12-CT head 3 cm right orbital mass with significant proptosis. 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 The mass is contiguous with active sinus disease via a defect in the medial lower orbit. 2. Left mastoid and middle ear opacification with negative Nasopharynx. 12/16 - says vision better and says hemoptysis reduced. ? History can be trusted due to language barrier. Per RN at bedside - no rituxan given since admission 10/04/2018  12/17 - first dose rituxan being given  now (G6PD pending) @ 375,g/m2 after extensive coordination with pharmacy. RN  Reports no problems so far. Patient nodding yes for any question. Overall reports being ok and no issues with infusion    Consults:  12/2 Nephrology 12/10 IR   Micro Data:  Blood cx 12/8- No growth Blood cx 12/10- No growth 12/12 Respiratory>> normal flora MRSA PCR 10/09/18 - negative  Antimicrobials:  Vanco 12/9>>12/14 Cefepime 12/8 >>12/15     Interim history/subjective:   12/18 - s/p first dose , week 1 rituxan yesterday. Now in HD . Says he vomited yesterday. Today says he is ok. No issues with HD. Plans for sNF discharge noted. Completing d3 of 1gm solumedrol today  Objective   Blood pressure 126/67, pulse 79, temperature 97.7 F (36.5 C), temperature source Oral, resp. rate 20, height 5\' 9"  (1.753 m), weight 79.7 kg, SpO2 95 %.        Intake/Output Summary (Last 24 hours) at 10/14/2018 1612 Last data filed at 10/14/2018 0457 Gross per 24 hour  Intake 240 ml  Output -  Net 240 ml   Filed Weights   10/12/18 0655 10/12/18  1102 10/14/18 1255  Weight: 78.4 kg 77.4 kg 79.7 kg  General Appearance:  Looks well Head:  Normocephalic, without obvious abnormality, atraumatic Eyes:  PERRL - yes, conjunctiva/corneas - clear     Ears:  Normal external ear canals, both ears Nose:  G  tube - no Throat:  ETT TUBE - no , OG tube - no Neck:  Supple,  No enlargement/tenderness/nodules Lungs: Clear to auscultation bilaterally,  Heart:  S1 and S2 normal, no murmur, CVP - no.  Pressors - no Abdomen:  Soft, no masses, no organomegaly Genitalia / Rectal:  Not done Extremities:  Extremities- intact Skin:  ntact in exposed areas .  Neurologic:  Sedation - nond -> RASS - +1 . Moves all 4s - yes. CAM-ICU - neg . Orientation - x3+ . Language barrier +          LABS    PULMONARY No results for input(s): PHART, PCO2ART, PO2ART, HCO3, TCO2, O2SAT in the last 168 hours.  Invalid input(s): PCO2, PO2  CBC Recent Labs  Lab 10/12/18 0736 10/13/18 0656 10/14/18 1307  HGB 7.9* 8.5* 8.6*  HCT 25.2* 27.5* 26.3*  WBC 14.0* 15.7* 15.2*  PLT 270 296 296    COAGULATION No results for input(s): INR in the last 168 hours.  CARDIAC  No results for input(s): TROPONINI in the last 168 hours. No results for input(s): PROBNP in the last 168 hours.   CHEMISTRY Recent Labs  Lab 10/08/18 0142 10/09/18 0308 10/10/18 0812 10/12/18 0736 10/13/18 0656 10/14/18 1307  NA 138 138 138 134* 134* 132*  K 3.9 4.5 4.7 4.9 4.4 4.4  CL 96* 96* 94* 90* 91* 91*  CO2 26 22 22  18* 23 18*  GLUCOSE 105* 116* 142* 222* 177* 177*  BUN 29* 68* 59* 163* 86* 144*  CREATININE 6.54* 10.15* 7.56* 13.49* 8.08* 10.69*  CALCIUM 8.5* 9.0 9.8 9.5 9.0 8.7*  MG 2.1 2.5*  --   --   --   --   PHOS  --   --   --  9.5*  --  10.0*   Estimated Creatinine Clearance: 8.1 mL/min (A) (by C-G formula based on SCr of 10.69 mg/dL (H)).   LIVER Recent Labs  Lab 10/12/18 0736 10/14/18 1307  ALBUMIN 2.3* 2.3*     INFECTIOUS No results for input(s): LATICACIDVEN, PROCALCITON in the last 168 hours.   ENDOCRINE CBG (last 3)  Recent Labs    10/13/18 2237 10/14/18 0820 10/14/18 1218  GLUCAP 181* 174* 142*         IMAGING x48h  - image(s) personally visualized  -   highlighted in bold No  results found.    Resolved Hospital Problem list     Assessment & Plan:  52 year old with history of small vessel vasculitis admitted with a flare of the same causing a pneumonia syndrome and severe swelling in sinuses and R Eye involvement and R.  Lung nodule is due to wegeners,   ANCA Vasculitis -WEgner High Risk Medication Use - s/p 3 days of 1gm solumedrol ending 10/14/18 and week 1 of 4Rituxan - 10/13/18  PLAN - start prednisone 60mg  per day 10/15/18 x 2 weeks and then 50mg  daily x 2 weeks and then  40 mg/day for 2 weeks. .  Subsequently, they will reduce the dose in a stepwise fashion every 2 weeks to 30 mg/d, 20 mg/d, 15 mg/d, 10 mg/d, 7.5 mg/d, 5 mg/d, 2.5 mg/d, until the participant is completely off prednisone. The entire tapering process  will require 20 weeks.     - Rituximab weekly for 4 weeks per RAVE trial at 375mg /meter square - first dose complete 10/13/18   - dose #2 - 10/22/18 -9 am at [facility] short stay center   - dose #3 - 10/30/2018 - 9am - [facility] short stay center   - dose $3  -1/0/2020  -9am [place] short stay - await G6PD from 10/13/18 and if normal -start bactrim for PJP prophylaxis (normal dose is 1DS tablet on 3 times per wek; might have to renally adjust) - addendum 10/15/2018 - g6pd is normal -> d/w [place] pharmacist - ok for 1DS Bactrim MWF - give QHS  =     - opd followup pending   Followup Future Appointments  Date Time Provider [place]  10/22/2018  9:00 AM [facility]-MD[facility] ROOM 2 [facility]-MD[facility] None  10/26/2018  9:30 AM Fenton Foy, NP L[facility]-PULCARE None  10/30/2018  9:00 AM [facility]-MD[facility] ROOM 7 MC-MDCC None  11/06/2018  9:00 AM [facility]-MD[facility] ROOM 7 MC-MDCC None  11/12/2018 12:00 PM Brand Males, MD [facility]-PULCARE None  11/24/2018  8:40 AM [facility] DEVICE REMOTES [facility]      D/w Dr Rhetta Mura and social work to ensure vsitis happen in this life threatening issue. CCM will sign off   > 50% of this > 40 min visit spent in face to face  counseling or/and coordination of care - by this undersigned MD - Dr Brand Males. This includes one or more of the following documented above: discussion of test results, diagnostic or treatment recommendations, prognosis, risks and benefits of management options, instructions, education, compliance or risk-factor reduction   SIGNATURE    Dr. Brand Males, M.D., F.C.C.P,  Pulmonary and Critical Care Medicine Staff Physician, [place] Director - Interstitial Lung Disease  Program  Pulmonary [name] at [place], [place], [address]  Pager: [phone], If no answer or between  15:00h - 7:00h: call [phone] Telephone: [phone]  4:12 PM 10/14/2018

## 2018-10-14 NOTE — Telephone Encounter (Signed)
Please call James Nicholson atg short stay and ensure they realize the importance of the schedule. He has severe vasculitis  Also, please give him appt with APP/myself/Mannam next 1-2 weeks to ensure everything going per schedule and then 1 month with me in ILD clinic

## 2018-10-14 NOTE — Telephone Encounter (Signed)
Pre Rx - give atleast  60 min prior to infusion - this is based on RAVE protocol NEJM    - tylenol 650mg  x 1   - benadryl 25mg  po x 1  Let me know what date you got. Very crucial you get next infusion I n7-10 days

## 2018-10-14 NOTE — Telephone Encounter (Signed)
Attempted to call laverne with short stay to see if I could go ahead and schedule pt's other infusions but unable to reach her. Left message for Laverne to ask for me directly when she returned call so I could get these appts scheduled for pt. Will update MR once I have been able to schedule these appts.

## 2018-10-14 NOTE — Telephone Encounter (Signed)
Emily  1. Thanks a lot 2. Patient is going to a SNF - so please tell short stay to give once a week x 3 appoints starting with 10/22/18 9am.  3. Give date for our office in 7-14 days and again with me in ILD clinic in 4-6 weeks  I recommend that you get all these dates using your judgement and then SNF can organize his trips     SIGNATURE    Dr. Brand Males, M.D., F.C.C.P,  Pulmonary and Critical Care Medicine Staff Physician, [place] Director - Interstitial Lung Disease  Program  Pulmonary [place] at [place], [place], [address]  Pager: [phone], If no answer or between  15:00h - 7:00h: call [phone] Telephone: [phone]  2:02 PM 10/14/2018

## 2018-10-14 NOTE — Telephone Encounter (Signed)
I saw that the first infusion was given yesterday, 10/13/18. I did put on the paperwork that pt's next Rituxan infusion needs to be 7-10 days from the first infusion which was received 10/13/18  Orders sheet has been fully filled out and has been faxed to [place] at Short Stay.

## 2018-10-14 NOTE — Telephone Encounter (Signed)
Thanks a lot. Now all that is remaining is that James Nicholson needs to give infusion #3 appt around 10/29/2018 and infusion #4 appt around 11/05/2018

## 2018-10-14 NOTE — Telephone Encounter (Signed)
Hospital Follow Up appt has been scheduled for pt Monday, December 30 at 9:30 with Lazaro Arms, NP and follow up with Dr. [name] in ILD clinic appt has been scheduled for pt Thursday, January 16 at 12:00pm.  appts are able to be seen on appt desk in pt's chart.  Routing to MR so he knows pt's appts.

## 2018-10-14 NOTE — Progress Notes (Addendum)
[facility] Progress Note   Subjective:  Seen in room. No new complaints. Cough improved. For HD today.   Objective Vitals:   10/13/18 2130 10/13/18 2200 10/13/18 2230 10/14/18 0456  BP: 137/80 138/67 133/66 (!) 148/88  Pulse: 84 75 75 86  Resp:    20  Temp: 98.1 F (36.7 C)   97.7 F (36.5 C)  TempSrc: Oral   Oral  SpO2: 97%   97%  Weight:      Height:       Physical Exam Gen: looking better, R eye protusion and redness mostly resolved.  Heart:RRR; 2/6 systolic murmur Lungs: clear bilat Abdomen:Soft, non-tender Extremities:No LE edema Dialysis Access:RUE AVF +bruit    Dialysis Orders: MWFat [facility] 4h 400/800 77.5kg 2/2 bath R AVF Hep 7600 - Mircera75 every 2 wks (looks like ordered after last admit, but not given) -Calcitriol41mcg PO q HD   Summary: H/o Biopsy-proven ANCA positive necrotizing glomerulonephritis treated with Cytoxan and steroids in 2010 in [place], admitted in 2014 here for alveolar hemorrhage, renal failure requiring Cytoxan and steroids.  [name]  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 Off Rx for years. SP lung biopsy of LUL pulm nodule here , results c/w Wegener's. Has dense RLL opacity.  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 Started on bolus IV steroids, cough and R eye sig better. Further immunosuppression per CCM. To start Rituxan infusions 12/17  ESRD on HD: HD MWF. Continue on schedule.   HTN/volume:BP ok. Volume stable. Lower dry wt slightly if possible.   Anemia ckd:Hgb 8.5. Aranesp 100 dosed 12/16.   Secondary hyperparathyroidism:Ca ok. Phos elevated. Follow on PhosLo binder.   Nutrition:Alb very low, continue Pro-stat BID  Lynnda Child PA-C [place] Pager [phone] 10/14/2018,10:20 AM  Pt seen, examined and agree w A/P as above.  Kelly Splinter MD [facility] pager [phone]   10/14/2018, 11:02 AM     Additional Objective Labs: Basic Metabolic Panel: Recent Labs  Lab 10/10/18 0812 10/12/18 0736 10/13/18 0656  NA 138 134* 134*  K 4.7 4.9 4.4  CL 94* 90* 91*  CO2 22 18* 23  GLUCOSE 142* 222* 177*  BUN 59* 163* 86*  CREATININE 7.56* 13.49* 8.08*  CALCIUM 9.8 9.5 9.0  PHOS  --  9.5*  --    Liver Function Tests: Recent Labs  Lab 10/12/18 0736  ALBUMIN 2.3*   CBC: Recent Labs  Lab 10/09/18 0709 10/10/18 0812 10/11/18 0745 10/12/18 0736 10/13/18 0656  WBC 11.8* 11.5* 17.0* 14.0* 15.7*  HGB 8.7* 9.7* 8.4* 7.9* 8.5*  HCT 27.8* 31.6* 27.6* 25.2* 27.5*  MCV 98.9 98.1 96.5 96.2 95.5  PLT 242 289 286 270 296   CBG: Recent Labs  Lab 10/13/18 0817 10/13/18 1212 10/13/18 1734 10/13/18 2237 10/14/18 0820  GLUCAP 154* 153* 151* 181* 174*   Medications: . methylPREDNISolone (SOLU-MEDROL) injection 250 mg (10/14/18 0855)   . artificial tears   Right Eye Q6H  . calcitRIOL  1 mcg Oral Q M,W,F-HD  . calcium acetate  1,334 mg Oral TID WC  . Chlorhexidine Gluconate Cloth  6 each Topical Q0600  . Chlorhexidine Gluconate Cloth  6 each Topical Q0600  . darbepoetin (ARANESP) injection - DIALYSIS  100 mcg Intravenous Q Mon-HD  . feeding supplement (PRO-STAT SUGAR FREE 64)  30 mL Oral BID  . gabapentin  300 mg Oral BID  . heparin injection (  subcutaneous)  5,000 Units Subcutaneous Q8H  . insulin aspart  0-5 Units Subcutaneous QHS  . insulin aspart  0-9 Units Subcutaneous TID WC  . risperiDONE  0.25 mg Oral QHS  . sodium chloride  1 spray Each Nare Q0200  . traZODone  100 mg Oral QHS

## 2018-10-15 ENCOUNTER — Inpatient Hospital Stay (HOSPITAL_COMMUNITY): Payer: Medicare Other

## 2018-10-15 LAB — GLUCOSE, CAPILLARY
Glucose-Capillary: 118 mg/dL — ABNORMAL HIGH (ref 70–99)
Glucose-Capillary: 101 mg/dL — ABNORMAL HIGH (ref 70–99)
Glucose-Capillary: 164 mg/dL — ABNORMAL HIGH (ref 70–99)
Glucose-Capillary: 132 mg/dL — ABNORMAL HIGH (ref 70–99)

## 2018-10-15 MED ORDER — SULFAMETHOXAZOLE-TRIMETHOPRIM 800-160 MG PO TABS: 1.0000 | ORAL_TABLET | ORAL | Status: DC

## 2018-10-15 MED ORDER — CALCIUM ACETATE (PHOS BINDER) 667 MG PO CAPS
2001.0000 mg | ORAL_CAPSULE | Freq: Three times a day (TID) | ORAL | Status: DC
  Administered 2018-10-15 – 2018-10-16 (×3): 2001 mg via ORAL
  Filled 2018-10-15 (×3): qty 3

## 2018-10-15 MED ORDER — SULFAMETHOXAZOLE-TRIMETHOPRIM 800-160 MG PO TABS
1.0000 | ORAL_TABLET | ORAL | Status: AC
  Administered 2018-10-15: 1 via ORAL
  Filled 2018-10-15: qty 1

## 2018-10-15 NOTE — Care Management Important Message (Signed)
Important Message  Patient Details  Name: James Nicholson MRN: [id number] Date of Birth: [birth year]   Medicare Important Message Given:  Yes    Coltyn Hanning [address] 10/15/2018, 3:57 PM

## 2018-10-15 NOTE — Telephone Encounter (Signed)
Called James Nicholson with short stay. I have set pt up for the third Rituxan infusion to be 10/30/18 at 9am and for his fourth Rituxan infusion to be 11/06/18 at [place] to MR.

## 2018-10-15 NOTE — Telephone Encounter (Signed)
Strong work on complicated issue. Appreciate it a lot

## 2018-10-15 NOTE — Progress Notes (Signed)
Physical Therapy Treatment Patient Details Name: James Nicholson MRN: [id number] DOB: [birth year] Today's Date: 10/15/2018    History of Present Illness 52 y.o. male of [place] descent with granulomatous polyangiitis (P-ANCA), ESRD on HD (MWF), IDDM, schizophrenia diagnosis (per chart), hx of VT arrest s/p ICD (2010) who was recently hospitalized for suspected PNA and discharged with PO abx but returns with ongoing dyspnea, worsening cough with blood tinged sputum. admitted on 10/04/2018 with persistent respiratory symptoms concerning for possible pneumonia. Lung nodule is due to wegeners, not organ threatening right now..  found to have orbital mass on CT scan upon evaluation of left eye redness/proptosis.    PT Comments    Pt agreeable to walking with therapy today and is making progress towards his goals however continues to be limited in safe mobility by decreased balance and endurance. Pt currently requires min guard for transfers and ambulation due to mild instability. When asked about his eyes, pt reports they are feeling better. D/c plans remain appropriate at this time to improve strength and mobility.   Follow Up Recommendations  SNF     Equipment Recommendations  Rolling walker with 5" wheels       Precautions / Restrictions Precautions Precautions: Fall Restrictions Weight Bearing Restrictions: No    Mobility  Bed Mobility Overal bed mobility: Modified Independent                Transfers Overall transfer level: Needs assistance Equipment used: Rolling walker (2 wheeled) Transfers: Sit to/from Stand Sit to Stand: Min guard         General transfer comment: min guard for safety, pt with good weightshifting through B LE before attempting to ambulate(min guard for safety )  Ambulation/Gait Ambulation/Gait assistance: Min guard Gait Distance (Feet): 300 Feet Assistive device: Rolling walker (2 wheeled) Gait Pattern/deviations: Step-through  pattern;Staggering left;Staggering right Gait velocity: decreased   General Gait Details: min guard for safety, 2x rest break for onset of coughing, mildly drifting R and L with ambulation      Balance Overall balance assessment: Needs assistance Sitting-balance support: Feet supported;No upper extremity supported Sitting balance-Leahy Scale: Fair     Standing balance support: Bilateral upper extremity supported Standing balance-Leahy Scale: Fair                              Cognition Arousal/Alertness: Awake/alert Behavior During Therapy: WFL for tasks assessed/performed Overall Cognitive Status: No family/caregiver present to determine baseline cognitive functioning                                 General Comments: Pt speaks broken english and requires increased time to process comands- not sure if it's due to language barrier or congnitive deficits.      Exercises Shoulder Exercises Shoulder Flexion: AROM;10 reps;Seated Shoulder ABduction: AROM;15 reps;Seated Elbow Flexion: AROM;10 reps;Seated    General Comments General comments (skin integrity, edema, etc.): Pt able to follow simple commands without interpreter, says "okay, okay" much of the time      Pertinent Vitals/Pain Faces Pain Scale: No hurt Pain Location: throat with coughing           PT Goals (current goals can now be found in the care plan section) Acute Rehab PT Goals Patient Stated Goal: to stop coughing PT Goal Formulation: With patient Time For Goal Achievement: 10/27/18 Potential to Achieve Goals: Fair Progress towards PT  goals: Progressing toward goals    Frequency    Min 3X/week      PT Plan Current plan remains appropriate       AM-PAC PT "6 Clicks" Mobility   Outcome Measure  Help needed turning from your back to your side while in a flat bed without using bedrails?: None Help needed moving from lying on your back to sitting on the side of a flat bed  without using bedrails?: None Help needed moving to and from a bed to a chair (including a wheelchair)?: A Little Help needed standing up from a chair using your arms (e.g., wheelchair or bedside chair)?: A Little Help needed to walk in hospital room?: A Little Help needed climbing 3-5 steps with a railing? : A Little 6 Click Score: 20    End of Session Equipment Utilized During Treatment: Gait belt Activity Tolerance: Patient tolerated treatment well Patient left: in chair;with call bell/phone within reach;with chair alarm set Nurse Communication: Mobility status PT Visit Diagnosis: Muscle weakness (generalized) (M62.81);Difficulty in walking, not elsewhere classified (R26.2)     Time: 1751-0258 PT Time Calculation (min) (ACUTE ONLY): 15 min  Charges:  $Gait Training: 8-22 mins                     Rease Wence B. Migdalia Dk PT, DPT Acute Rehabilitation Services Pager [phone] Office [phone]    Inez 10/15/2018, 12:22 PM

## 2018-10-15 NOTE — Progress Notes (Signed)
[facility] Progress Note   Subjective:   Seen in room. No new complaints. Some cough, but overall improving.   Objective Vitals:   10/14/18 1701 10/14/18 1734 10/14/18 2048 10/15/18 0632  BP: 126/84 121/75 105/70 112/69  Pulse: 84 86 87 81  Resp: 16 (!) 22 18 18   Temp: 97.8 F (36.6 C) 98 F (36.7 C) 98.1 F (36.7 C) 97.9 F (36.6 C)  TempSrc: Oral Oral Oral Oral  SpO2: 96% 98% 97% 96%  Weight: 78 kg     Height:       Physical Exam Gen: looking better, R eye protusion and redness mostly resolved.  Heart:RRR; 2/6 systolic murmur Lungs: clear bilat Abdomen:Soft, non-tender Extremities:No LE edema Dialysis Access:RUE AVF +bruit    Dialysis Orders: MWFat [facility] 4h 400/800 77.5kg 2/2 bath R AVF Hep 7600 - Mircera75 every 2 wks (looks like ordered after last admit, but not given) -Calcitriol22mcg PO q HD   Summary: H/o Biopsy-proven ANCA positive necrotizing glomerulonephritis treated with Cytoxan and steroids in 2010 in [place], admitted in 2014 here for alveolar hemorrhage, renal failure requiring Cytoxan and steroids.  [name]  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 Off Rx for years. SP lung biopsy of LUL pulm nodule here , results c/w Wegener's. Has dense RLL opacity.  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 Started on bolus IV steroids. Cough, SOB,  R eye sig improved. Further immunosuppression per CCM.  Rituxan infusions started 12/17  ESRD on HD: HD MWF. Continue on schedule.   HTN/volume:BP ok. Volume stable. Lower dry wt slightly if possible.   Anemia ckd:Hgb 8.5. Aranesp 100 q Monday   Secondary hyperparathyroidism:Ca ok. Phos remains elevated. Increase PhosLo 2>3 tabs qac.   Nutrition:Alb very low, continue Pro-stat BID  Lynnda Child PA-C [facility] Pager [phone] 10/15/2018,9:45  AM  Additional Objective Labs: Basic Metabolic Panel: Recent Labs  Lab 10/12/18 0736 10/13/18 0656 10/14/18 1307  NA 134* 134* 132*  K 4.9 4.4 4.4  CL 90* 91* 91*  CO2 18* 23 18*  GLUCOSE 222* 177* 177*  BUN 163* 86* 144*  CREATININE 13.49* 8.08* 10.69*  CALCIUM 9.5 9.0 8.7*  PHOS 9.5*  --  10.0*   Liver Function Tests: Recent Labs  Lab 10/12/18 0736 10/14/18 1307  ALBUMIN 2.3* 2.3*   CBC: Recent Labs  Lab 10/10/18 0812 10/11/18 0745 10/12/18 0736 10/13/18 0154 10/13/18 0656 10/14/18 1307  WBC 11.5* 17.0* 14.0*  --  15.7* 15.2*  HGB 9.7* 8.4* 7.9* 8.0* 8.5* 8.6*  HCT 31.6* 27.6* 25.2*  --  27.5* 26.3*  MCV 98.1 96.5 96.2  --  95.5 96.0  PLT 289 286 270  --  296 296   CBG: Recent Labs  Lab 10/14/18 0820 10/14/18 1218 10/14/18 1737 10/14/18 2150 10/15/18 0845  GLUCAP 174* 142* 141* 162* 118*   Medications:  . artificial tears   Right Eye Q6H  . calcitRIOL  1 mcg Oral Q M,W,F-HD  . calcium acetate  1,334 mg Oral TID WC  . Chlorhexidine Gluconate Cloth  6 each Topical Q0600  . Chlorhexidine Gluconate Cloth  6 each Topical Q0600  . darbepoetin (ARANESP) injection - DIALYSIS  100 mcg Intravenous Q Mon-HD  . feeding supplement (PRO-STAT SUGAR FREE 64)  30 mL Oral BID  . gabapentin  300 mg Oral BID  . heparin injection (subcutaneous)  5,000 Units Subcutaneous Q8H  . insulin  aspart  0-5 Units Subcutaneous QHS  . insulin aspart  0-9 Units Subcutaneous TID WC  . predniSONE  60 mg Oral Q breakfast  . risperiDONE  0.25 mg Oral QHS  . sodium chloride  1 spray Each Nare Q0200  . sulfamethoxazole-trimethoprim  1 tablet Oral NOW  . [START ON 10/16/2018] sulfamethoxazole-trimethoprim  1 tablet Oral Q M,W,F-2000  . traZODone  100 mg Oral QHS

## 2018-10-15 NOTE — Progress Notes (Signed)
Occupational Therapy Treatment Patient Details Name: James Nicholson MRN: [id number] DOB: 08/02/1966 Today's Date: 10/15/2018    History of present illness 52 y.o. male of [place] descent with granulomatous polyangiitis (P-ANCA), ESRD on HD (MWF), IDDM, schizophrenia diagnosis (per chart), hx of VT arrest s/p ICD (2010) who was recently hospitalized for suspected PNA and discharged with PO abx but returns with ongoing dyspnea, worsening cough with blood tinged sputum. admitted on 10/04/2018 with persistent respiratory symptoms concerning for possible pneumonia. Lung nodule is due to wegeners, not organ threatening right now..  found to have orbital mass on CT scan upon evaluation of left eye redness/proptosis.   OT comments  Pt agreeable to therapy upon arrival seated in recliner. Pt performing ADL functional transfers with minguard for proper hand placement to avoid plopping onto chair. Pt performing ADL functional mobility in room and hallway with minguardA with RW exhibiting fair balance, but prefers moving quicker and requires cues to slow down.Marland Kitchen Pt performing BUE exercise shoulder through digits in sitting with visual cues for proper technique. Pt tolerating session well. BP taken due to dizziness in sitting 108/67 and standing 107/68 due to dizziness after ambulating. Pt able to use spirometer x5 times with independence. Pt continues to progress with limitations above and would benefit from OT skilled services in SNF setting in order to increase independence with ADLs and mobility.   Follow Up Recommendations  SNF    Equipment Recommendations       Recommendations for Other Services      Precautions / Restrictions Precautions Precautions: Fall Restrictions Weight Bearing Restrictions: No       Mobility Bed Mobility Overal bed mobility: Modified Independent                Transfers Overall transfer level: Needs assistance(minguardA) Equipment used: Rolling walker (2  wheeled) Transfers: Sit to/from Stand Sit to Stand: Min guard         General transfer comment:  min guard assist for safety(min guard for safety )    Balance Overall balance assessment: Needs assistance   Sitting balance-Leahy Scale: Fair     Standing balance support: Bilateral upper extremity supported                               ADL either performed or assessed with clinical judgement   ADL Overall ADL's : Needs assistance/impaired(for safety to slow down) Eating/Feeding: Set up   Grooming: Supervision/safety   Upper Body Bathing: Set up           Lower Body Dressing: Min guard;Sit to/from stand   Toilet Transfer: Min guard;Cueing for Designer, television/film set and Hygiene: Min guard;Sit to/from stand       Functional mobility during ADLs: Min guard General ADL Comments: SetupA to Min guard for ADLs due to safety cues required.     Vision   Vision Assessment?: No apparent visual deficits   Perception     Praxis      Cognition Arousal/Alertness: Awake/alert Behavior During Therapy: WFL for tasks assessed/performed Overall Cognitive Status: No family/caregiver present to determine baseline cognitive functioning                                 General Comments: Pt appeared to comprehend 1 to 2 step commands throughout session and speak English.(used interpreter)        Exercises  Exercises: Shoulder Shoulder Exercises Shoulder Flexion: AROM;10 reps;Seated Shoulder ABduction: AROM;15 reps;Seated Elbow Flexion: AROM;10 reps;Seated   Shoulder Instructions       General Comments pt following commands and did not need to use interpreter today.    Pertinent Vitals/ Pain       Faces Pain Scale: No hurt Pain Location: (throat with coughing )  Home Living                                          Prior Functioning/Environment              Frequency  Min 2X/week         Progress Toward Goals  OT Goals(current goals can now be found in the care plan section)     Acute Rehab OT Goals Patient Stated Goal: to stay out of the hospital OT Goal Formulation: With patient Time For Goal Achievement: 10/13/18 Potential to Achieve Goals: Good ADL Goals Additional ADL Goal #1: Pt will perform ADL functional mobility with good safety awareness using appropriate adaptive device with Supervision A. Additional ADL Goal #2: Pt will increase to supervisionA for ADLs in standing at sink.  Plan Discharge plan remains appropriate    Co-evaluation                 AM-PAC OT "6 Clicks" Daily Activity     Outcome Measure   Help from another person eating meals?: None Help from another person taking care of personal grooming?: None Help from another person toileting, which includes using toliet, bedpan, or urinal?: None Help from another person bathing (including washing, rinsing, drying)?: A Little Help from another person to put on and taking off regular upper body clothing?: None Help from another person to put on and taking off regular lower body clothing?: A Little 6 Click Score: 22    End of Session Equipment Utilized During Treatment: Gait belt  OT Visit Diagnosis: Unsteadiness on feet (R26.81);Muscle weakness (generalized) (M62.81)   Activity Tolerance Patient tolerated treatment well   Patient Left in chair;with call bell/phone within reach   Nurse Communication Mobility status        Time: 1035-1105 OT Time Calculation (min): 30 min  Charges: OT General Charges $OT Visit: 1 Visit OT Treatments $Self Care/Home Management : 8-22 mins $Therapeutic Activity: 8-22 mins  Darryl Nestle) Marsa Aris OTR/L Acute Rehabilitation Services Pager: [phone] Office: [phone]   Fredda Hammed 10/15/2018, 11:48 AM

## 2018-10-16 DIAGNOSIS — M6281 Muscle weakness (generalized): Secondary | ICD-10-CM | POA: Diagnosis not present

## 2018-10-16 DIAGNOSIS — D509 Iron deficiency anemia, unspecified: Secondary | ICD-10-CM | POA: Diagnosis not present

## 2018-10-16 DIAGNOSIS — I10 Essential (primary) hypertension: Secondary | ICD-10-CM

## 2018-10-16 DIAGNOSIS — G4709 Other insomnia: Secondary | ICD-10-CM | POA: Diagnosis not present

## 2018-10-16 DIAGNOSIS — R2689 Other abnormalities of gait and mobility: Secondary | ICD-10-CM | POA: Diagnosis not present

## 2018-10-16 DIAGNOSIS — Z227 Latent tuberculosis: Secondary | ICD-10-CM | POA: Diagnosis not present

## 2018-10-16 DIAGNOSIS — E785 Hyperlipidemia, unspecified: Secondary | ICD-10-CM | POA: Diagnosis not present

## 2018-10-16 DIAGNOSIS — R918 Other nonspecific abnormal finding of lung field: Secondary | ICD-10-CM | POA: Diagnosis not present

## 2018-10-16 DIAGNOSIS — F2089 Other schizophrenia: Secondary | ICD-10-CM | POA: Diagnosis not present

## 2018-10-16 DIAGNOSIS — G629 Polyneuropathy, unspecified: Secondary | ICD-10-CM | POA: Diagnosis not present

## 2018-10-16 DIAGNOSIS — E1122 Type 2 diabetes mellitus with diabetic chronic kidney disease: Secondary | ICD-10-CM | POA: Diagnosis not present

## 2018-10-16 DIAGNOSIS — R41841 Cognitive communication deficit: Secondary | ICD-10-CM | POA: Diagnosis not present

## 2018-10-16 DIAGNOSIS — R2681 Unsteadiness on feet: Secondary | ICD-10-CM | POA: Diagnosis not present

## 2018-10-16 DIAGNOSIS — F209 Schizophrenia, unspecified: Secondary | ICD-10-CM | POA: Diagnosis not present

## 2018-10-16 DIAGNOSIS — E1140 Type 2 diabetes mellitus with diabetic neuropathy, unspecified: Secondary | ICD-10-CM | POA: Diagnosis not present

## 2018-10-16 DIAGNOSIS — D72829 Elevated white blood cell count, unspecified: Secondary | ICD-10-CM | POA: Diagnosis not present

## 2018-10-16 DIAGNOSIS — K219 Gastro-esophageal reflux disease without esophagitis: Secondary | ICD-10-CM | POA: Diagnosis not present

## 2018-10-16 DIAGNOSIS — N032 Chronic nephritic syndrome with diffuse membranous glomerulonephritis: Secondary | ICD-10-CM | POA: Diagnosis not present

## 2018-10-16 DIAGNOSIS — Z992 Dependence on renal dialysis: Secondary | ICD-10-CM | POA: Diagnosis not present

## 2018-10-16 DIAGNOSIS — E119 Type 2 diabetes mellitus without complications: Secondary | ICD-10-CM | POA: Diagnosis not present

## 2018-10-16 DIAGNOSIS — N2581 Secondary hyperparathyroidism of renal origin: Secondary | ICD-10-CM | POA: Diagnosis not present

## 2018-10-16 DIAGNOSIS — E1129 Type 2 diabetes mellitus with other diabetic kidney complication: Secondary | ICD-10-CM | POA: Diagnosis not present

## 2018-10-16 DIAGNOSIS — I120 Hypertensive chronic kidney disease with stage 5 chronic kidney disease or end stage renal disease: Secondary | ICD-10-CM | POA: Diagnosis not present

## 2018-10-16 DIAGNOSIS — H0520 Unspecified exophthalmos: Secondary | ICD-10-CM | POA: Diagnosis not present

## 2018-10-16 DIAGNOSIS — Z794 Long term (current) use of insulin: Secondary | ICD-10-CM | POA: Diagnosis not present

## 2018-10-16 DIAGNOSIS — J9601 Acute respiratory failure with hypoxia: Secondary | ICD-10-CM | POA: Diagnosis not present

## 2018-10-16 DIAGNOSIS — M3131 Wegener's granulomatosis with renal involvement: Secondary | ICD-10-CM | POA: Diagnosis not present

## 2018-10-16 DIAGNOSIS — I776 Arteritis, unspecified: Secondary | ICD-10-CM | POA: Diagnosis not present

## 2018-10-16 DIAGNOSIS — N186 End stage renal disease: Secondary | ICD-10-CM | POA: Diagnosis not present

## 2018-10-16 DIAGNOSIS — Z7401 Bed confinement status: Secondary | ICD-10-CM | POA: Diagnosis not present

## 2018-10-16 DIAGNOSIS — M2550 Pain in unspecified joint: Secondary | ICD-10-CM | POA: Diagnosis not present

## 2018-10-16 DIAGNOSIS — D631 Anemia in chronic kidney disease: Secondary | ICD-10-CM | POA: Diagnosis not present

## 2018-10-16 DIAGNOSIS — H5789 Other specified disorders of eye and adnexa: Secondary | ICD-10-CM | POA: Diagnosis not present

## 2018-10-16 DIAGNOSIS — M3130 Wegener's granulomatosis without renal involvement: Secondary | ICD-10-CM | POA: Diagnosis not present

## 2018-10-16 LAB — CBC
HCT: 26.4 % — ABNORMAL LOW (ref 39.0–52.0)
Hemoglobin: 8.7 g/dL — ABNORMAL LOW (ref 13.0–17.0)
MCH: 31.6 pg (ref 26.0–34.0)
MCHC: 33 g/dL (ref 30.0–36.0)
MCV: 96 fL (ref 80.0–100.0)
Platelets: 369 10*3/uL (ref 150–400)
RBC: 2.75 MIL/uL — ABNORMAL LOW (ref 4.22–5.81)
RDW: 15.3 % (ref 11.5–15.5)
WBC: 22.2 10*3/uL — ABNORMAL HIGH (ref 4.0–10.5)
nRBC: 0.4 % — ABNORMAL HIGH (ref 0.0–0.2)

## 2018-10-16 LAB — RENAL FUNCTION PANEL
Albumin: 2.1 g/dL — ABNORMAL LOW (ref 3.5–5.0)
Anion gap: 17 — ABNORMAL HIGH (ref 5–15)
BUN: 118 mg/dL — ABNORMAL HIGH (ref 6–20)
CO2: 22 mmol/L (ref 22–32)
Calcium: 8.6 mg/dL — ABNORMAL LOW (ref 8.9–10.3)
Chloride: 98 mmol/L (ref 98–111)
Creatinine, Ser: 9.48 mg/dL — ABNORMAL HIGH (ref 0.61–1.24)
GFR calc Af Amer: 7 mL/min — ABNORMAL LOW (ref >60)
GFR calc non Af Amer: 6 mL/min — ABNORMAL LOW (ref >60)
Glucose, Bld: 89 mg/dL (ref 70–99)
Phosphorus: 8.8 mg/dL — ABNORMAL HIGH (ref 2.5–4.6)
Potassium: 4 mmol/L (ref 3.5–5.1)
Sodium: 137 mmol/L (ref 135–145)

## 2018-10-16 LAB — GLUCOSE, CAPILLARY
Glucose-Capillary: 104 mg/dL — ABNORMAL HIGH (ref 70–99)
Glucose-Capillary: 99 mg/dL (ref 70–99)

## 2018-10-16 MED ORDER — SULFAMETHOXAZOLE-TRIMETHOPRIM 800-160 MG PO TABS: 1.0000 | ORAL_TABLET | ORAL | 0 refills | Status: DC

## 2018-10-16 MED ORDER — CALCITRIOL 0.5 MCG PO CAPS
ORAL_CAPSULE | ORAL | Status: AC
  Filled 2018-10-16: qty 2

## 2018-10-16 MED ORDER — PREDNISONE 20 MG PO TABS: 60.0000 mg | ORAL_TABLET | Freq: Every day | ORAL | 0 refills | Status: AC

## 2018-10-16 MED ORDER — CALCITRIOL 0.5 MCG PO CAPS: 1.0000 ug | ORAL_CAPSULE | ORAL | 0 refills | Status: DC

## 2018-10-16 MED ORDER — IPRATROPIUM-ALBUTEROL 0.5-2.5 (3) MG/3ML IN SOLN: 3.0000 mL | RESPIRATORY_TRACT | Status: DC | PRN

## 2018-10-16 MED ORDER — INSULIN ASPART 100 UNIT/ML ~~LOC~~ SOLN: 0.0000 [IU] | Freq: Three times a day (TID) | SUBCUTANEOUS | 11 refills | Status: DC

## 2018-10-16 NOTE — Progress Notes (Signed)
Report called to [place] at [facility].

## 2018-10-16 NOTE — Clinical Social Work Placement (Signed)
   CLINICAL SOCIAL WORK PLACEMENT  NOTE [facility]  Date:  10/16/2018  Patient Details  Name: James Nicholson MRN: [id number] Date of Birth: [birth year]  Clinical Social Work is seeking post-discharge placement for this patient at the [place] level of care (*CSW will initial, date and re-position this form in  chart as items are completed):  Yes   Patient/family provided with [facility] Work Department's list of facilities offering this level of care within the geographic area requested by the patient (or if unable, by the patient's family).  Yes   Patient/family informed of their freedom to choose among providers that offer the needed level of care, that participate in Medicare, Medicaid or managed care program needed by the patient, have an available bed and are willing to accept the patient.  Yes   Patient/family informed of [facility]'s ownership interest in [facility] and [facility], as well as of the fact that they are under no obligation to receive care at these facilities.  PASRR submitted to EDS on       PASRR number received on       Existing PASRR number confirmed on 10/14/18     FL2 transmitted to all facilities in geographic area requested by pt/family on 10/14/18     FL2 transmitted to all facilities within larger geographic area on       Patient informed that his/her managed care company has contracts with or will negotiate with certain facilities, including the following:        Yes   Patient/family informed of bed offers received.  Patient chooses bed at [facility] recommends and patient chooses bed at      Patient to be transferred to [facility] on 10/16/18.  Patient to be transferred to facility by [facility]     Patient family notified on 10/16/18 of transfer.  Name of family member notified:  pt brother Velcro     PHYSICIAN Please prepare priority  discharge summary, including medications, Please prepare prescriptions     Additional Comment:    _______________________________________________ Alexander Mt, LCSWA 10/16/2018, 12:06 PM

## 2018-10-16 NOTE — Progress Notes (Signed)
PROGRESS NOTE  Late entry. Patient was seen on 12/19  James Nicholson  WLN:[id number] DOB: 12/07/1965 DOA: 10/04/2018 PCP: [name], MD    Brief Narrative:  52 y.o. year old male of [place] descent with medical history significant for ESRD on HD, ANCA granulomatosis polyangiitis (diagnosed 2010), schizophrenia, history of VT arrest status post ICD (2010), history of latent TB (status post 9 months of isoniazid 04/2012) who presented on 10/04/2018 with worsening cough, dyspnea and blood-tinged sputum after recent hospitalization and discharge (12/1-12/02/2018) for presumed H CAP for which he empirically received IV ceftriaxone and azithromycin and was discharged on cefdinir and azithromycin to complete 7-day course.  Hospital course complicated by: found to have orbital mass on CT scan upon evaluation of left eye redness/proptosis. Concerning for granuloma related to Wegener's. Patient still has intact vision so recommended to continue artificial tears per ophthalmology.  IR had obtained biopsy of pulmonary nodule initially concerning for malignancy but found to be consistent with granulomatosis.  Patient was started on IV Solu-Medrol pulse dosing by direction of pulmonary consultants will previously consulted due to poor clinical response with broad-spectrum antibiotics with presentation was initially thought to be related to potential pneumonia.  He did complete 7 full days of IV cefepime.  He has had significant improvement in his cough, shortness of breath, eye swelling and eye redness since initiating Solu-Medrol dosing.  Rituxan infusion was started on 12/17.    Assessment & Plan:   Active Problems:   H/o Ventricular fibrillation Arrest now with AICD   Hypertension   Automatic implantable cardioverter-defibrillator in situ   PPD positive - completed 9 months INH (per Dr. Linus Salmons)   Pneumonia   Wegener's disease, pulmonary ([place])   ESRD (end stage renal disease) ([place])   Lung  nodules--left upper lobe and left posterior lobe- need to rule out malignancy   Orbital mass   Sinusitis   Redness of right eye   Ocular proptosis   Pulmonary nodule, left   Cough  #Acute hypoxic respiratory failure related to bilateral basilar opacities secondary to active Wegener's flare, continues to improve.  Currently on room air.  Completed solumedrol course and first dose of Rituxan infusion to start on 12/17 under pulmonary guidance. ultation, Case manager assisting with arranging outpatient Rituxan infusion( will need weekly for 4 weeks) with short stay at [facility]. Now on prednisone taper per Pulmonary. 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  Significant improvement in right eye redness, no changes in vision. Findings earlier discussed with Dr. [name] [facility]) on 12/12 who recommended artificial tears and follow up with Dr. Manuella Ghazi [facility]) on discharge. Given patient has no changes in vision these recs were given over phone, will need to re-consult if any changes in vision.  Discussed with ophthalmology (Dr. [name]) on 12/13 getting formal eye exam for accurate baseline, still pending.   #Pansinusitis.  CT head shows lobulated areas of patchy mucosal thickening in right maxillary, bilateral anterior ethmoid and left frontal sinus and confirmed on dedicated sinus CT. ENT agrees with initiation of medical therapy as long as vision remains intact.  Will follow up as an outpatient, nasal saline to reduce dryness or bleeding and crusting in the nasal passageway. Stable at this time  #Sepsis ruled out. Tachypnea likely related to vasculitic disease. WBC remains stable, slightly higher in setting of high dosed steroids  #  Leukocytosis.  Previously resolving but now uptrending likely in the setting of IV  steroids.  We will continue to monitor as patient remains afebrile. Stable at present  #Left apical pulmonary nodule.   Status post CT-guided lung biopsy on 12/11 by IR, consistent with granulomatous disease as mentioned above, currently stable  #ESRD on HD, Monday Wednesday Friday.  Appreciate nephrology following and managing dialysis sessions.  Continue PhosLo 3 times daily. Stable . Seen on HD today  #Type 2 diabetes.  A1c 4.8%.  Holding home Levemir, monitor CBGs on sliding scale   #Schizophrenia, stable.   -Continue home Risperdal and trazodone -Stable at present  #History of granulomatosis polyangiitis with renal disease.   -Diagnosed in 2010 (previously treated with Cytoxan and steroids, last hospitalization for flare in 2014 with symptoms of epistasis, renal failure and alveolar hemorrhage renal CT scan) .  #Peripheral neuropathy in lower extremities.   -Continue home gabapentin -Presently stable  #Normocytic anemia, anemia of chronic disease (ESRD).   -Hemodynamically stable at this time  #Nausea, vomiting -New espisode today -Abd mildly distended -Ordered and reviewed abd xray. Findings of constipation -Encourage bowel movement   DVT prophylaxis: Heparin subQ Code Status: Full Family Communication: Pt in room, family at bedside Disposition Plan: Uncertain at this time  Consultants:   Pulmonary  Procedures:     Antimicrobials: Anti-infectives (From admission, onward)   Start     Dose/Rate Route Frequency Ordered Stop   10/16/18 2000  sulfamethoxazole-trimethoprim (BACTRIM DS,SEPTRA DS) 800-160 MG per tablet 1 tablet     1 tablet Oral Every M-W-F (2000) 10/15/18 0857     10/16/18 0000  sulfamethoxazole-trimethoprim (BACTRIM DS,SEPTRA DS) 800-160 MG tablet     1 tablet Oral Every M-W-F (2000) 10/16/18 1225 11/15/18 2359   10/15/18 0900  sulfamethoxazole-trimethoprim (BACTRIM DS,SEPTRA DS) 800-160 MG per tablet 1 tablet     1 tablet Oral NOW 10/15/18  0857 10/15/18 1033   10/05/18 1200  vancomycin (VANCOCIN) IVPB 750 mg/150 ml premix  Status:  Discontinued     750 mg 150 mL/hr over 60 Minutes Intravenous Every M-W-F (Hemodialysis) 10/04/18 2049 10/10/18 1616   10/04/18 2100  ceFEPIme (MAXIPIME) 1 g in sodium chloride 0.9 % 100 mL IVPB  Status:  Discontinued     1 g 200 mL/hr over 30 Minutes Intravenous Every 24 hours 10/04/18 2039 10/11/18 0736   10/04/18 2100  vancomycin (VANCOCIN) 1,750 mg in sodium chloride 0.9 % 500 mL IVPB     1,750 mg 250 mL/hr over 120 Minutes Intravenous  Once 10/04/18 2047 10/05/18 0137      Subjective: Complains of nausea and vomiting this AM  Objective: Vitals:   10/16/18 1000 10/16/18 1030 10/16/18 1100 10/16/18 1116  BP: 115/63 108/63 (!) 110/58 114/63  Pulse: 88 90 89 88  Resp: 16 16 15 16   Temp:    97.6 F (36.4 C)  TempSrc:    Oral  SpO2:    99%  Weight:    77 kg  Height:        Intake/Output Summary (Last 24 hours) at 10/16/2018 1234 Last data filed at 10/16/2018 1116 Gross per 24 hour  Intake 237 ml  Output 2100 ml  Net -1863 ml   Filed Weights   10/14/18 1701 10/16/18 0705 10/16/18 1116  Weight: 78 kg 79.1 kg 77 kg    Examination: General exam: Conversant, in no acute distress Respiratory system: normal chest rise, clear, no audible wheezing Cardiovascular system: regular rhythm, s1-s2 Gastrointestinal system: Mildly  distended, decreased BS Central nervous system: No seizures, no tremors Extremities: No cyanosis, no joint deformities Skin: No rashes, no pallor Psychiatry: Affect normal // no auditory hallucinations   Data Reviewed: I have personally reviewed following labs and imaging studies  CBC: Recent Labs  Lab 10/11/18 0745 10/12/18 0736 10/13/18 0154 10/13/18 0656 10/14/18 1307 10/16/18 0625  WBC 17.0* 14.0*  --  15.7* 15.2* 22.2*  HGB 8.4* 7.9* 8.0* 8.5* 8.6* 8.7*  HCT 27.6* 25.2*  --  27.5* 26.3* 26.4*  MCV 96.5 96.2  --  95.5 96.0 96.0  PLT 286 270  --   296 296 681   Basic Metabolic Panel: Recent Labs  Lab 10/10/18 0812 10/12/18 0736 10/13/18 0656 10/14/18 1307 10/16/18 0625  NA 138 134* 134* 132* 137  K 4.7 4.9 4.4 4.4 4.0  CL 94* 90* 91* 91* 98  CO2 22 18* 23 18* 22  GLUCOSE 142* 222* 177* 177* 89  BUN 59* 163* 86* 144* 118*  CREATININE 7.56* 13.49* 8.08* 10.69* 9.48*  CALCIUM 9.8 9.5 9.0 8.7* 8.6*  PHOS  --  9.5*  --  10.0* 8.8*   GFR: Estimated Creatinine Clearance: 9.1 mL/min (A) (by C-G formula based on SCr of 9.48 mg/dL (H)). Liver Function Tests: Recent Labs  Lab 10/12/18 0736 10/14/18 1307 10/16/18 0625  ALBUMIN 2.3* 2.3* 2.1*   No results for input(s): LIPASE, AMYLASE in the last 168 hours. No results for input(s): AMMONIA in the last 168 hours. Coagulation Profile: No results for input(s): INR, PROTIME in the last 168 hours. Cardiac Enzymes: No results for input(s): CKTOTAL, CKMB, CKMBINDEX, TROPONINI in the last 168 hours. BNP (last 3 results) No results for input(s): PROBNP in the last 8760 hours. HbA1C: No results for input(s): HGBA1C in the last 72 hours. CBG: Recent Labs  Lab 10/15/18 0845 10/15/18 1212 10/15/18 1644 10/15/18 2126 10/16/18 1205  GLUCAP 118* 101* 164* 132* 104*   Lipid Profile: No results for input(s): CHOL, HDL, LDLCALC, TRIG, CHOLHDL, LDLDIRECT in the last 72 hours. Thyroid Function Tests: No results for input(s): TSH, T4TOTAL, FREET4, T3FREE, THYROIDAB in the last 72 hours. Anemia Panel: No results for input(s): VITAMINB12, FOLATE, FERRITIN, TIBC, IRON, RETICCTPCT in the last 72 hours. Sepsis Labs: No results for input(s): PROCALCITON, LATICACIDVEN in the last 168 hours.  Recent Results (from the past 240 hour(s))  Culture, blood (Routine X 2) w Reflex to ID Panel     Status: None   Collection Time: 10/06/18  4:30 PM  Result Value Ref Range Status   Specimen Description BLOOD LEFT HAND  Final   Special Requests   Final    BOTTLES DRAWN AEROBIC ONLY Blood Culture  results may not be optimal due to an inadequate volume of blood received in culture bottles   Culture   Final    NO GROWTH 5 DAYS Performed at [facility] [address] [place], [place] [address]    Report Status 10/13/2018 FINAL  Final  Expectorated sputum assessment w rflx to resp cult     Status: None   Collection Time: 10/08/18  8:16 PM  Result Value Ref Range Status   Specimen Description EXPECTORATED SPUTUM  Final   Special Requests Normal  Final   Sputum evaluation   Final    THIS SPECIMEN IS ACCEPTABLE FOR SPUTUM CULTURE Performed at [facility], [place] [address], [place], [place] [address]    Report Status 10/09/2018 FINAL  Final  Culture, respiratory  Status: None   Collection Time: 10/08/18  8:16 PM  Result Value Ref Range Status   Specimen Description EXPECTORATED SPUTUM  Final   Special Requests Normal Reflexed from [id number]  Final   Gram Stain   Final    ABUNDANT WBC PRESENT,BOTH PMN AND MONONUCLEAR RARE GRAM POSITIVE COCCI RARE GRAM VARIABLE ROD    Culture   Final    FEW Consistent with normal respiratory flora. Performed at [facility], [place] [address], [place], [place] [address]    Report Status 10/11/2018 FINAL  Final  MRSA PCR Screening     Status: None   Collection Time: 10/09/18  1:37 PM  Result Value Ref Range Status   MRSA by PCR NEGATIVE NEGATIVE Final    Comment:        The GeneXpert MRSA Assay (FDA approved for NASAL specimens only), is one component of a comprehensive MRSA colonization surveillance program. It is not intended to diagnose MRSA infection nor to guide or monitor treatment for MRSA infections. Performed at [facility], [place] [address], [place], [place] [address]      Radiology Studies: Dg Abd 1 View  Result Date: 10/15/2018 CLINICAL DATA:  Abdominal pain, possible ileus EXAM: ABDOMEN - 1 VIEW COMPARISON:  07/06/2016 FINDINGS: Scattered large and small bowel gas is noted. Retained fecal material is  noted consistent with a mild degree of constipation. No obstructive changes are seen. No free air is noted. No bony abnormality is noted. IMPRESSION: Mild constipation. Electronically Signed   By: Inez Catalina M.D.   On: 10/15/2018 16:14    Scheduled Meds: . artificial tears   Right Eye Q6H  . calcitRIOL      . calcitRIOL  1 mcg Oral Q M,W,F-HD  . calcium acetate  2,001 mg Oral TID WC  . Chlorhexidine Gluconate Cloth  6 each Topical Q0600  . Chlorhexidine Gluconate Cloth  6 each Topical Q0600  . darbepoetin (ARANESP) injection - DIALYSIS  100 mcg Intravenous Q Mon-HD  . feeding supplement (PRO-STAT SUGAR FREE 64)  30 mL Oral BID  . gabapentin  300 mg Oral BID  . heparin injection (subcutaneous)  5,000 Units Subcutaneous Q8H  . insulin aspart  0-5 Units Subcutaneous QHS  . insulin aspart  0-9 Units Subcutaneous TID WC  . predniSONE  60 mg Oral Q breakfast  . risperiDONE  0.25 mg Oral QHS  . sodium chloride  1 spray Each Nare Q0200  . sulfamethoxazole-trimethoprim  1 tablet Oral Q M,W,F-2000  . traZODone  100 mg Oral QHS   Continuous Infusions:    LOS: 12 days   Marylu Lund, MD [facility] Pager On Amion  If 7PM-7AM, please contact night-coverage 10/16/2018, 12:34 PM

## 2018-10-16 NOTE — Social Work (Signed)
Confirmed bed at [facility], provided Jaquita Rector with upcoming dates for infusions at short stay.  CSW continuing to follow for support with disposition.  Westley Hummer, MSW, [place] [employer] [phone]

## 2018-10-16 NOTE — Progress Notes (Addendum)
[facility] Progress Note   Subjective:  Seen on HD, 2L UF goal and tolerating. Denies CP/dyspnea today.  Objective Vitals:   10/16/18 0444 10/16/18 0705 10/16/18 0716 10/16/18 0721  BP: 118/73 (P) 122/69 (P) 124/67 (P) 118/65  Pulse: 75 (P) 73 (P) 77 (P) 68  Resp: 18 (P) 16 (P) 15 (P) 16  Temp: 98 F (36.7 C) (P) 97.6 F (36.4 C)    TempSrc: Oral (P) Oral    SpO2: 100% (P) 98%    Weight:      Height:       Physical Exam General: Ill appearing man (although improved). NAD. R eye redness/protrusion near resolved. Heart: RRR; 2/6 systolic murmur Lungs: Coarse air movement throughout, no overt wheezing Abdomen: soft, non-tender Extremities: No LE edema Dialysis Access: RUE AVF + thrill  Additional Objective Labs: Basic Metabolic Panel: Recent Labs  Lab 10/12/18 0736 10/13/18 0656 10/14/18 1307 10/16/18 0625  NA 134* 134* 132* 137  K 4.9 4.4 4.4 4.0  CL 90* 91* 91* 98  CO2 18* 23 18* 22  GLUCOSE 222* 177* 177* 89  BUN 163* 86* 144* 118*  CREATININE 13.49* 8.08* 10.69* 9.48*  CALCIUM 9.5 9.0 8.7* 8.6*  PHOS 9.5*  --  10.0* 8.8*   Liver Function Tests: Recent Labs  Lab 10/12/18 0736 10/14/18 1307 10/16/18 0625  ALBUMIN 2.3* 2.3* 2.1*   CBC: Recent Labs  Lab 10/11/18 0745 10/12/18 0736  10/13/18 0656 10/14/18 1307 10/16/18 0625  WBC 17.0* 14.0*  --  15.7* 15.2* 22.2*  HGB 8.4* 7.9*   < > 8.5* 8.6* 8.7*  HCT 27.6* 25.2*  --  27.5* 26.3* 26.4*  MCV 96.5 96.2  --  95.5 96.0 96.0  PLT 286 270  --  296 296 369   < > = values in this interval not displayed.   CBG: Recent Labs  Lab 10/14/18 2150 10/15/18 0845 10/15/18 1212 10/15/18 1644 10/15/18 2126  GLUCAP 162* 118* 101* 164* 132*   Studies/Results: Dg Abd 1 View  Result Date: 10/15/2018 CLINICAL DATA:  Abdominal pain, possible ileus EXAM: ABDOMEN - 1 VIEW COMPARISON:  07/06/2016 FINDINGS: Scattered large and small bowel gas is noted. Retained fecal material is noted consistent  with a mild degree of constipation. No obstructive changes are seen. No free air is noted. No bony abnormality is noted. IMPRESSION: Mild constipation. Electronically Signed   By: Inez Catalina M.D.   On: 10/15/2018 16:14   Medications:  . artificial tears   Right Eye Q6H  . calcitRIOL  1 mcg Oral Q M,W,F-HD  . calcium acetate  2,001 mg Oral TID WC  . Chlorhexidine Gluconate Cloth  6 each Topical Q0600  . Chlorhexidine Gluconate Cloth  6 each Topical Q0600  . darbepoetin (ARANESP) injection - DIALYSIS  100 mcg Intravenous Q Mon-HD  . feeding supplement (PRO-STAT SUGAR FREE 64)  30 mL Oral BID  . gabapentin  300 mg Oral BID  . heparin injection (subcutaneous)  5,000 Units Subcutaneous Q8H  . insulin aspart  0-5 Units Subcutaneous QHS  . insulin aspart  0-9 Units Subcutaneous TID WC  . predniSONE  60 mg Oral Q breakfast  . risperiDONE  0.25 mg Oral QHS  . sodium chloride  1 spray Each Nare Q0200  . sulfamethoxazole-trimethoprim  1 tablet Oral Q M,W,F-2000  . traZODone  100 mg Oral QHS    Dialysis Orders: MWFat [facility] 4h 400/800 77.5kg 2/2 bath R AVF Hep 7600 - Mircera75 every 2 wks (looks  like ordered after last admit, but not given) -Calcitriol73mcg PO q HD  Summary: Hx Biopsy-proven ANCA positive necrotizing glomerulonephritis treated with Cytoxan and steroids in 2010 in [place], admitted in 2014 here for alveolar hemorrhage, renal failure requiring Cytoxan and steroids. He has not been on immunosuppressivessince 2014.  Assessment/Plan:  Cough/LUL lung nodule/ RLL consolidation: Work-up (including LUL pulm nodule biopsy) consistent with Wegeners flare. Head CT with R orbital pseudotumor. Started on pulse IV steroid (now tapering) and Rituxan infusion (1st 12/17, set up for next 3 infusions as outpatient). Symptoms improving overall.  ESRD on HD: Continue HD per usual MWF schedule. For HD today.  HTN/volume:BP controlled, no edema. EDW still seems accurate.  Anemia  ckd:Hgb 8.7. Continue Aranesp 171mcg q Monday.  Secondary hyperparathyroidism:Ca ok, Phos very high. Phoslo ^d to 3/meals. Continue VDRA.  Nutrition:Alb very low (2.1),continuePro-stat BID.  Leukocytosis: Reactive to steroids. Done with regular antibiotics at this time (on Bactrim TIW d/t immunosuppression).  Debility: Weakness s/p prolonged hospitalization. Working with PT.   Veneta Penton, PA-C 10/16/2018, 8:21 AM  [facility] Pager: [phone]  Pt seen, examined and agree w A/P as above.  Kelly Splinter MD [employer] pager [phone]   10/16/2018, 10:07 AM

## 2018-10-16 NOTE — Discharge Summary (Signed)
Physician Discharge Summary  James Nicholson TKW:[id number] DOB: 07-31-1966 DOA: 10/04/2018  PCP: James Mccreedy, MD  Admit date: 10/04/2018 Discharge date: 10/16/2018  Admitted From: Home Disposition:  SNF  Recommendations for Outpatient Follow-up:  1. Follow up with PCP in 1-2 weeks 2. Follow up with Pulmonary as scheduled  Per Pulmonary: "start prednisone 60mg  per day 10/15/18 x 2 weeks and then 50mg  daily x 2 weeks and then  40 mg/day for 2 weeks." Subsequent prednisone taper to be determined by Pulmonologist as outpatient   - Rituximab weekly for 4 weeks per RAVE trial at 375mg /meter square - first dose complete 10/13/18                         - dose #2 - 10/22/18 -9 am at [facility] short stay center                         - dose #3 - 10/30/2018 - 9am - [facility] short stay center                         - dose $3  -1/0/2020  -9am [facility] short stay  Discharge Condition:Improved CODE STATUS:Full Diet recommendation: Diabetic   Brief/Interim Summary: 52 y.o.year old James Nicholson with medical history significant for ESRD on HD, ANCA granulomatosis polyangiitis (diagnosed 2010), schizophrenia, history of VT arrest status post ICD (2010), history of latent TB (status post 9 months of isoniazid 04/2012) who presented on 12/8/2019with worsening cough, dyspnea and blood-tinged sputum after recent hospitalization and discharge (12/1-12/02/2018) for presumed H CAP for which he empirically received IV ceftriaxone and azithromycin and was discharged on cefdinir and azithromycin to complete 7-day course.  Hospital course complicated by: found to have orbital mass on CT scan upon evaluation of left eye redness/proptosis. Concerning for granuloma related to Wegener's. Patient still has intact vision so recommended to continue artificial tears per ophthalmology.IR had obtained biopsy of pulmonary nodule initially concerning for malignancy but found to be consistent with  granulomatosis. Patient was started on IV Solu-Medrol pulse dosing by direction of pulmonary consultants will previously consulted due to poor clinical response with broad-spectrum antibiotics with presentation was initially thought to be related to potential pneumonia. He did complete 7 full days of IV cefepime. He has had significant improvement in his cough, shortness of breath, eye swelling and eye redness since initiating Solu-Medrol dosing. Rituxan infusion was started on 12/17.   Discharge Diagnoses:  Active Problems:   H/o Ventricular fibrillation Arrest now with AICD   Hypertension   Automatic implantable cardioverter-defibrillator in situ   PPD positive - completed 9 months INH (per Dr. Linus Salmons)   Pneumonia   Wegener's disease, pulmonary ([place])   ESRD (end stage renal disease) ([place])   Lung nodules--left upper lobe and left posterior lobe- need to rule out malignancy   Orbital mass   Sinusitis   Redness of right eye   Ocular proptosis   Pulmonary nodule, left   Cough  #Acute hypoxic respiratory failure related to bilateral basilar opacities secondary to active Wegener's flare,continues to improve. Currently on room air. Completed solumedrol course and first dose of Rituxan infusion to start on 12/17 under pulmonary guidance.ultation,Case manager assisting with arranging outpatient Rituxan infusion( will need weekly for 4 weeks)with [facility]. Discussed with Pulmonary - patient absolutely needs to follow up with outpatient treatments as condition would be  life threatening. Now on prednisone taper. Taper regimen as follows: "start prednisone 60mg  per day 10/15/18 x 2 weeks and then 50mg  daily x 2 weeks and then  40 mg/day for 2 weeks. .  Subsequently, they will reduce the dose in a stepwise fashion every 2 weeks to 30 mg/d, 20 mg/d, 15 mg/d, 10 mg/d, 7.5 mg/d, 5 mg/d, 2.5 mg/d, until the participant is completely off prednisone. 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 Significant improvement in right eye redness, no changes in vision. Findings earlier discussed with Dr. [name] [facility]) on 12/12 who recommended artificial tears and follow up with Dr. Manuella Ghazi ( [facility] discharge.Given patient has no changes in vision these recs were given over phone, will need to re-consult if any changes in vision. Discussed with ophthalmology(Dr. Marchase)on 12/13 getting formal eye exam for accurate baseline  #Pansinusitis. CT head shows lobulated areas of patchy mucosal thickening in right maxillary, bilateral anterior ethmoid and left frontal sinus and confirmed on dedicated sinus CT. ENT agrees with initiation of medical therapy as long as vision remains intact. Will follow up as an outpatient, nasal saline to reduce dryness or bleeding and crusting in the nasal passageway. Stable at this time  #Sepsis ruled out. Tachypnea likely related to vasculitic disease. WBC remains stable, slightly higher in setting of high dosed steroids  #Leukocytosis. Previously resolving but now uptrending likely in the setting of IV steroids. We will continue to monitor as patient remains afebrile. Stable at present  #Left apical pulmonary nodule. Status post CT-guided lung biopsy on 12/11 by IR, consistent with granulomatous disease as mentioned above, currently stable  #ESRD on HD, Monday Wednesday Friday. Appreciate nephrology following and managing dialysis sessions. Continue PhosLo 3 times daily. Stable . Seen on HD today  #Type 2 diabetes. A1c 4.8%. Held home Levemir, monitor CBGs on sliding scale   #Schizophrenia, stable.  -Continue home Risperdal and trazodone -Stable at present  #History of granulomatosis polyangiitis with renal disease.  -Diagnosed in 2010 (previously treated with Cytoxan and steroids, last hospitalization for flare in 2014  with symptoms of epistasis, renal failure and alveolar hemorrhage renal CT scan) .  #Peripheral neuropathy in lower extremities.  -Continue home gabapentin -Presently stable  #Normocytic anemia, anemia of chronic disease (ESRD).  -Hemodynamically stable at this time   Discharge Instructions   Allergies as of 10/16/2018   No Known Allergies     Medication List    STOP taking these medications   ciprofloxacin 0.3 % ophthalmic solution Commonly known as:  CILOXAN   gemfibrozil 600 MG tablet Commonly known as:  LOPID   insulin detemir 100 UNIT/ML injection Commonly known as:  LEVEMIR   metoprolol succinate 50 MG 24 hr tablet Commonly known as:  TOPROL-XL     TAKE these medications   calcitRIOL 0.5 MCG capsule Commonly known as:  ROCALTROL Take 2 capsules (1 mcg total) by mouth every Monday, Wednesday, and Friday with hemodialysis. Start taking on:  October 19, 2018   calcium acetate 667 MG capsule Commonly known as:  PHOSLO Take 1,334 mg by mouth 3 (three) times daily with meals. 8a, 12p, 5p   gabapentin 300 MG capsule Commonly known as:  NEURONTIN Take 300 mg by mouth 2 (two) times daily.   insulin aspart 100 UNIT/ML injection Commonly known as:  novoLOG Inject 0-9 Units into the skin 3 (three) times daily with meals. Dose as follows: CBG < 70:  implement hypoglycemia protocol  CBG 70 - 120: 0 units  CBG 121 - 150: 0 units  CBG 151 - 200: 0 units  CBG 201 - 250: 2 units  CBG 251 - 300: 3 units  CBG 301 - 350: 4 units  CBG 351 - 400: 5 units  CBG > 400 Notify facility MD   ipratropium-albuterol 0.5-2.5 (3) MG/3ML Soln Commonly known as:  DUONEB Take 3 mLs by nebulization every 4 (four) hours as needed.   omeprazole 20 MG capsule Commonly known as:  PRILOSEC Take 20 mg by mouth every morning.   predniSONE 20 MG tablet Commonly known as:  DELTASONE Take 3 tablets (60 mg total) by mouth daily with breakfast for 14 days.   risperiDONE 0.25 MG  tablet Commonly known as:  RISPERDAL Take 0.25 mg by mouth at bedtime.   sulfamethoxazole-trimethoprim 800-160 MG tablet Commonly known as:  BACTRIM DS,SEPTRA DS Take 1 tablet by mouth every Monday, Wednesday, and Friday at 8 PM.   traZODone 100 MG tablet Commonly known as:  DESYREL Take 100 mg by mouth at bedtime.       Contact information for follow-up providers    Brand Males, MD. Schedule an appointment as soon as possible for a visit.   Specialty:  Pulmonary Disease Why:  as scheduled Contact information: [address] [place] [address] [phone]            Contact information for after-discharge care    [facility] .   Service:  Skilled Nursing Contact information: [address] [place] [phone]                 No Known Allergies  Consultations:  Pulmonary  Procedures/Studies: Dg Chest 2 View  Result Date: 10/10/2018 CLINICAL DATA:  Cough. Acute respiratory failure. Diabetes. EXAM: CHEST - 2 VIEW COMPARISON:  10/07/2018. FINDINGS: Single lead AICD remains in good position. Enlarged cardiac silhouette, but improved from priors. Improved lung volumes, with chronic volume loss most prominent at the RIGHT base with scarring. No pneumothorax or significant effusion. IMPRESSION: No active cardiopulmonary disease. Improved aeration compared with most recent priors. Electronically Signed   By: Staci Righter M.D.   On: 10/10/2018 09:33   Dg Chest 2 View  Result Date: 10/04/2018 CLINICAL DATA:  Cough, pneumonia, blood in sputum EXAM: CHEST - 2 VIEW COMPARISON:  09/27/2017 FINDINGS: Low lung volumes. Bibasilar atelectasis is suspected. No focal consolidation. Left lower hemithorax obscured. No pleural effusion or pneumothorax. Cardiomegaly.  Left subclavian ICD. IMPRESSION: Low lung volumes with probable bibasilar atelectasis. Electronically Signed   By: Julian Hy M.D.   On: 10/04/2018 19:34   Dg Chest 2 View  Result Date: 09/27/2018 CLINICAL DATA:  Chest pain and shortness of breath.  Cough. EXAM: CHEST - 2 VIEW COMPARISON:  11/03/2014 FINDINGS: Stable position of the cardiac pacemaker. Enlarged cardiac silhouette.  Mediastinal contours appear intact. There is no evidence of focal pleural effusion or pneumothorax. Elevation of the right hemidiaphragm likely due to volume loss in the right hemithorax. Subtle peribronchial bilateral lower lobe opacities. Osseous structures are without acute abnormality. Soft tissues are grossly normal. IMPRESSION: Bilateral peribronchial lower lobe airspace opacities which may represent atelectasis or airspace consolidation. Electronically Signed   By: Fidela Salisbury M.D.   On: 09/27/2018 10:00   Dg Abd 1 View  Result Date: 10/15/2018 CLINICAL DATA:  Abdominal pain, possible ileus EXAM: ABDOMEN -  1 VIEW COMPARISON:  07/06/2016 FINDINGS: Scattered large and small bowel gas is noted. Retained fecal material is noted consistent with a mild degree of constipation. No obstructive changes are seen. No free air is noted. No bony abnormality is noted. IMPRESSION: Mild constipation. Electronically Signed   By: Inez Catalina M.D.   On: 10/15/2018 16:14   Ct Head Wo Contrast  Result Date: 10/08/2018 CLINICAL DATA:  Orbital asymmetry/proptosis. History of Wegner's granulomatosis. End-stage renal disease EXAM: CT HEAD WITHOUT CONTRAST TECHNIQUE: Contiguous axial images were obtained from the base of the skull through the vertex without intravenous contrast. COMPARISON:  None. FINDINGS: Brain: No evidence of acute infarction, hemorrhage, hydrocephalus, extra-axial collection or mass lesion/mass effect. Vascular: No hyperdense vessel or unexpected calcification. Skull: Normal. Negative for fracture or focal lesion. Sinuses/Orbits: There is sinusitis with lobulated patchy mucosal thickening in the right maxillary, bilateral anterior  ethmoid, and left frontal sinus. There are areas of bone loss along the anterior and low right lamina papyracea and at the right uncinate process. These defects are more likely inflammatory than postsurgical in this setting. Polypoid mucosal thickening seen in the nasal cavity. There is a contiguous soft tissue mass that is fairly dense in the inferior and medial right orbit measuring up to 31 x 25 mm with mass effect on the globe and medial/inferior recti. This orbital mass is nonspecific by imaging (and could be pseudotumor or lymphoma), but in this clinical setting is most consistent with orbital granuloma from the patient's Wegner's. Complete opacification of the left mastoid and middle ear spaces with negative nasopharynx. IMPRESSION: 1. 3 cm right orbital mass with significant proptosis. 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 The mass is contiguous with active sinus disease via a defect in the medial lower orbit. 2. Left mastoid and middle ear opacification with negative nasopharynx. 3. Normal intracranial imaging. Electronically Signed   By: Monte Fantasia M.D.   On: 10/08/2018 10:35   Ct Chest Wo Contrast  Result Date: 10/04/2018 CLINICAL DATA:  Pneumonia. EXAM: CT CHEST WITHOUT CONTRAST TECHNIQUE: Multidetector CT imaging of the chest was performed following the standard protocol without IV contrast. COMPARISON:  04/12/2013 FINDINGS: Cardiovascular: Heart is enlarged. No substantial pericardial effusion. Permanent pacemaker noted. Coronary artery calcification is evident. Mediastinum/Nodes: No mediastinal lymphadenopathy. No evidence for gross hilar lymphadenopathy although assessment is limited by the lack of intravenous contrast on today's study. The esophagus has normal imaging features. There is no axillary lymphadenopathy. Lungs/Pleura: The central tracheobronchial airways are patent.  Patchy airspace disease noted in the lower lobes bilaterally suggests pneumonia. 11 x 17 mm nodule identified in the left lung apex, not present on the previous remote study and concerning for primary bronchogenic neoplasm. No pleural effusion. Upper Abdomen: Unremarkable. Musculoskeletal: No worrisome lytic or sclerotic osseous abnormality. IMPRESSION: 1. Bibasilar patchy airspace disease suggests pneumonia. 2. 11 x 17 mm left apical nodule highly suspicious for primary bronchogenic neoplasm. 3.  Aortic Atherosclerois (ICD10-170.0) Electronically Signed   By: Misty Stanley M.D.   On: 10/04/2018 21:40   Ct Biopsy  Result Date: 10/07/2018 INDICATION: 52 year old male with a history of granulomatosis with polyangiitis and a left apical pulmonary nodule. Differential considerations include granulomatous nodule, and possibly primary bronchogenic carcinoma. He presents for CT-guided biopsy of the same. EXAM: CT-guided biopsy left upper lobe pulmonary nodule Interventional Radiologist:  Criselda Peaches, MD MEDICATIONS: None. ANESTHESIA/SEDATION: Fentanyl 1 mcg IV; Versed 50 mg IV Moderate  Sedation Time:  13 minutes The patient was continuously monitored during the procedure by the interventional radiology nurse under my direct supervision. FLUOROSCOPY TIME:  None. COMPLICATIONS: None immediate. Estimated blood loss:  0 PROCEDURE: Informed written consent was obtained from the patient after a thorough discussion of the procedural risks, benefits and alternatives. All questions were addressed. Maximal Sterile Barrier Technique was utilized including caps, mask, sterile gowns, sterile gloves, sterile drape, hand hygiene and skin antiseptic. A timeout was performed prior to the initiation of the procedure. A planning axial CT scan was performed. The nodule in the left lung apex was successfully identified. A suitable skin entry site was selected and marked. The region was then sterilely prepped and draped in standard  fashion using Betadine skin prep. Local anesthesia was attained by infiltration with 1% lidocaine. A small dermatotomy was made. Under intermittent CT fluoroscopic guidance, a 17 gauge trocar needle was advanced into the lung and positioned at the margin of the nodule. Multiple 18 gauge core biopsies were then coaxially obtained using the BioPince automated biopsy device. Biopsy specimens were placed in formalin and delivered to pathology for further analysis. The biopsy device and introducer needle were removed and a bio sentry device was deployed. Post biopsy axial CT imaging demonstrates no evidence of immediate complication. There is no pneumothorax. Mild perilesional alveolar hemorrhage is not unexpected. The patient tolerated the procedure well. IMPRESSION: Technically successful CT-guided biopsy left apical pulmonary nodule. Electronically Signed   By: Jacqulynn Cadet M.D.   On: 10/07/2018 10:47   Dg Chest Port 1 View  Result Date: 10/07/2018 CLINICAL DATA:  Status post left lung biopsy. EXAM: PORTABLE CHEST 1 VIEW COMPARISON:  Procedural images from CT-guided biopsy earlier today. Chest radiographs and CT 10/04/2018. FINDINGS: A single lead ICD remains in place. The cardiac silhouette remains enlarged. Lung volumes are low with similar appearance of patchy bibasilar airspace opacities. Small nodular density is again seen in the left lung apex. No pneumothorax or sizable pleural effusion is identified. IMPRESSION: 1. No pneumothorax. 2. Low lung volumes with persistent bibasilar atelectasis or pneumonia. Electronically Signed   By: Logan Bores M.D.   On: 10/07/2018 12:02   Ct Maxillofacial Wo Contrast  Result Date: 10/09/2018 CLINICAL DATA:  52 y/o M; history of Wegener's disease. Right orbit mass with proptosis. EXAM: CT MAXILLOFACIAL WITHOUT CONTRAST TECHNIQUE: Multidetector CT imaging of the maxillofacial structures was performed. Multiplanar CT image reconstructions were also generated.  COMPARISON:  None. FINDINGS: Osseous: No acute fracture or mandibular dislocation. Dental disease with multiple dental caries, absent crowns, and periapical cysts. Orbits: 18 mm dehiscence of the right orbit inferomedial wall (series 7, image 28). Mass within the right orbit involving the medial and inferior extraconal compartments and the inferior aspect of the intraconal compartment spanning 3.4 x 2.6 x 2.2 cm (AP x ML x CC series 3, image 65 and series 7, image 32). The mass envelops the medial and inferior rectus muscles as well as abuts the optic nerve complex which is displaced superiorly. Associated proptosis of the right globe. No mass or inflammatory process of the left orbit. Sinuses: Diffuse paranasal sinus disease greatest in the right maxillary sinus with there is moderate mucosal thickening and within the opacified left frontal sinus. Left-greater-than-right mastoid effusions. Soft tissues: No mass or inflammatory process of the deep facial compartments. No lymphadenopathy of the face. Limited intracranial: No significant or unexpected finding. IMPRESSION: 1. Nonspecific right orbital mass measuring up to 3.4 cm as described above with proptosis.  Findings likely represent an orbital granuloma due to granulomatosis with polyangiitis. Bony dehiscence of the medial wall of the right orbit is also likely associated. Broad differential including but not limited to pseudotumor, lymphoma, and metastasis. 2. Extensive paranasal sinus disease greatest in left frontal and right maxillary sinuses. 3. Left-greater-than-right mastoid effusions. Electronically Signed   By: Kristine Garbe M.D.   On: 10/09/2018 00:25    Subjective: Without complaints  Discharge Exam: Vitals:   10/16/18 1100 10/16/18 1116  BP: (!) 110/58 114/63  Pulse: 89 88  Resp: 15 16  Temp:  97.6 F (36.4 C)  SpO2:  99%   Vitals:   10/16/18 1000 10/16/18 1030 10/16/18 1100 10/16/18 1116  BP: 115/63 108/63 (!) 110/58  114/63  Pulse: 88 90 89 88  Resp: 16 16 15 16   Temp:    97.6 F (36.4 C)  TempSrc:    Oral  SpO2:    99%  Weight:    77 kg  Height:        General: Pt is alert, awake, not in acute distress Cardiovascular: RRR, S1/S2 +, no rubs, no gallops Respiratory: CTA bilaterally, no wheezing, no rhonchi Abdominal: Soft, NT, ND, bowel sounds + Extremities: no edema, no cyanosis   The results of significant diagnostics from this hospitalization (including imaging, microbiology, ancillary and laboratory) are listed below for reference.     Microbiology: Recent Results (from the past 240 hour(s))  Culture, blood (Routine X 2) w Reflex to ID Panel     Status: None   Collection Time: 10/06/18  4:30 PM  Result Value Ref Range Status   Specimen Description BLOOD LEFT HAND  Final   Special Requests   Final    BOTTLES DRAWN AEROBIC ONLY Blood Culture results may not be optimal due to an inadequate volume of blood received in culture bottles   Culture   Final    NO GROWTH 5 DAYS Performed at [facility], [place] [address] [place], [place] [address]    Report Status 10/13/2018 FINAL  Final  Expectorated sputum assessment w rflx to resp cult     Status: None   Collection Time: 10/08/18  8:16 PM  Result Value Ref Range Status   Specimen Description EXPECTORATED SPUTUM  Final   Special Requests Normal  Final   Sputum evaluation   Final    THIS SPECIMEN IS ACCEPTABLE FOR SPUTUM CULTURE Performed at [facility], [place] [address], [place], [place] [address]    Report Status 10/09/2018 FINAL  Final  Culture, respiratory     Status: None   Collection Time: 10/08/18  8:16 PM  Result Value Ref Range Status   Specimen Description EXPECTORATED SPUTUM  Final   Special Requests Normal Reflexed from [id number]  Final   Gram Stain   Final    ABUNDANT WBC PRESENT,BOTH PMN AND MONONUCLEAR RARE GRAM POSITIVE COCCI RARE GRAM VARIABLE ROD    Culture   Final    FEW Consistent with normal respiratory  flora. Performed at [facility], [place] [address] [place], [place] [address]    Report Status 10/11/2018 FINAL  Final  MRSA PCR Screening     Status: None   Collection Time: 10/09/18  1:37 PM  Result Value Ref Range Status   MRSA by PCR NEGATIVE NEGATIVE Final    Comment:        The GeneXpert MRSA Assay (FDA approved for NASAL specimens only), is one component of a comprehensive MRSA  colonization surveillance program. It is not intended to diagnose MRSA infection nor to guide or monitor treatment for MRSA infections. Performed at [facility], [place] [address], [place], [place] [address]      Labs: BNP (last 3 results) No results for input(s): BNP in the last 8760 hours. Basic Metabolic Panel: Recent Labs  Lab 10/10/18 0812 10/12/18 0736 10/13/18 0656 10/14/18 1307 10/16/18 0625  NA 138 134* 134* 132* 137  K 4.7 4.9 4.4 4.4 4.0  CL 94* 90* 91* 91* 98  CO2 22 18* 23 18* 22  GLUCOSE 142* 222* 177* 177* 89  BUN 59* 163* 86* 144* 118*  CREATININE 7.56* 13.49* 8.08* 10.69* 9.48*  CALCIUM 9.8 9.5 9.0 8.7* 8.6*  PHOS  --  9.5*  --  10.0* 8.8*   Liver Function Tests: Recent Labs  Lab 10/12/18 0736 10/14/18 1307 10/16/18 0625  ALBUMIN 2.3* 2.3* 2.1*   No results for input(s): LIPASE, AMYLASE in the last 168 hours. No results for input(s): AMMONIA in the last 168 hours. CBC: Recent Labs  Lab 10/11/18 0745 10/12/18 0736 10/13/18 0154 10/13/18 0656 10/14/18 1307 10/16/18 0625  WBC 17.0* 14.0*  --  15.7* 15.2* 22.2*  HGB 8.4* 7.9* 8.0* 8.5* 8.6* 8.7*  HCT 27.6* 25.2*  --  27.5* 26.3* 26.4*  MCV 96.5 96.2  --  95.5 96.0 96.0  PLT 286 270  --  296 296 369   Cardiac Enzymes: No results for input(s): CKTOTAL, CKMB, CKMBINDEX, TROPONINI in the last 168 hours. BNP: Invalid input(s): POCBNP CBG: Recent Labs  Lab 10/15/18 0845 10/15/18 1212 10/15/18 1644 10/15/18 2126 10/16/18 1205  GLUCAP 118* 101* 164* 132* 104*   D-Dimer No results for  input(s): DDIMER in the last 72 hours. Hgb A1c No results for input(s): HGBA1C in the last 72 hours. Lipid Profile No results for input(s): CHOL, HDL, LDLCALC, TRIG, CHOLHDL, LDLDIRECT in the last 72 hours. Thyroid function studies No results for input(s): TSH, T4TOTAL, T3FREE, THYROIDAB in the last 72 hours.  Invalid input(s): FREET3 Anemia work up No results for input(s): VITAMINB12, FOLATE, FERRITIN, TIBC, IRON, RETICCTPCT in the last 72 hours. Urinalysis    Component Value Date/Time   COLORURINE RED (A) 07/06/2016 1938   APPEARANCEUR TURBID (A) 07/06/2016 1938   LABSPEC 1.020 07/06/2016 1938   PHURINE 7.5 07/06/2016 1938   GLUCOSEU NEGATIVE 07/06/2016 1938   HGBUR LARGE (A) 07/06/2016 1938   BILIRUBINUR NEGATIVE 07/06/2016 1938   KETONESUR NEGATIVE 07/06/2016 1938   PROTEINUR >300 (A) 07/06/2016 1938   UROBILINOGEN 0.2 11/04/2014 0952   NITRITE NEGATIVE 07/06/2016 1938   LEUKOCYTESUR SMALL (A) 07/06/2016 1938   Sepsis Labs Invalid input(s): PROCALCITONIN,  WBC,  LACTICIDVEN Microbiology Recent Results (from the past 240 hour(s))  Culture, blood (Routine X 2) w Reflex to ID Panel     Status: None   Collection Time: 10/06/18  4:30 PM  Result Value Ref Range Status   Specimen Description BLOOD LEFT HAND  Final   Special Requests   Final    BOTTLES DRAWN AEROBIC ONLY Blood Culture results may not be optimal due to an inadequate volume of blood received in culture bottles   Culture   Final    NO GROWTH 5 DAYS Performed at [facility], [place] [address], [place], [place] [address]    Report Status 10/13/2018 FINAL  Final  Expectorated sputum assessment w rflx to resp cult     Status: None   Collection Time: 10/08/18  8:16 PM  Result Value Ref Range Status   Specimen Description EXPECTORATED SPUTUM  Final   Special Requests Normal  Final   Sputum evaluation   Final    THIS SPECIMEN IS ACCEPTABLE FOR SPUTUM CULTURE Performed at [facility], [address] [place], [place] [address]    Report Status 10/09/2018 FINAL  Final  Culture, respiratory     Status: None   Collection Time: 10/08/18  8:16 PM  Result Value Ref Range Status   Specimen Description EXPECTORATED SPUTUM  Final   Special Requests Normal Reflexed from [id number]  Final   Gram Stain   Final    ABUNDANT WBC PRESENT,BOTH PMN AND MONONUCLEAR RARE GRAM POSITIVE COCCI RARE GRAM VARIABLE ROD    Culture   Final    FEW Consistent with normal respiratory flora. Performed at [facility], [place] [address], [place], [place] [address]    Report Status 10/11/2018 FINAL  Final  MRSA PCR Screening     Status: None   Collection Time: 10/09/18  1:37 PM  Result Value Ref Range Status   MRSA by PCR NEGATIVE NEGATIVE Final    Comment:        The GeneXpert MRSA Assay (FDA approved for NASAL specimens only), is one component of a comprehensive MRSA colonization surveillance program. It is not intended to diagnose MRSA infection nor to guide or monitor treatment for MRSA infections. Performed at [facility], [place] [address] [place], [place] [address]    Time spent: 30 min  SIGNED:   Marylu Lund, MD  [facility] 10/16/2018, 12:36 PM  If 7PM-7AM, please contact night-coverage

## 2018-10-18 DIAGNOSIS — D631 Anemia in chronic kidney disease: Secondary | ICD-10-CM | POA: Diagnosis not present

## 2018-10-18 DIAGNOSIS — N2581 Secondary hyperparathyroidism of renal origin: Secondary | ICD-10-CM | POA: Diagnosis not present

## 2018-10-18 DIAGNOSIS — N186 End stage renal disease: Secondary | ICD-10-CM | POA: Diagnosis not present

## 2018-10-18 DIAGNOSIS — E119 Type 2 diabetes mellitus without complications: Secondary | ICD-10-CM | POA: Diagnosis not present

## 2018-10-19 ENCOUNTER — Encounter: Payer: Self-pay | Admitting: Adult Health

## 2018-10-19 ENCOUNTER — Encounter: Payer: Self-pay | Admitting: Internal Medicine

## 2018-10-19 ENCOUNTER — Non-Acute Institutional Stay (SKILLED_NURSING_FACILITY): Payer: Medicare Other | Admitting: Internal Medicine

## 2018-10-19 DIAGNOSIS — E119 Type 2 diabetes mellitus without complications: Secondary | ICD-10-CM | POA: Diagnosis not present

## 2018-10-19 DIAGNOSIS — Z794 Long term (current) use of insulin: Secondary | ICD-10-CM | POA: Diagnosis not present

## 2018-10-19 DIAGNOSIS — IMO0001 Reserved for inherently not codable concepts without codable children: Secondary | ICD-10-CM

## 2018-10-19 DIAGNOSIS — M3131 Wegener's granulomatosis with renal involvement: Secondary | ICD-10-CM | POA: Diagnosis not present

## 2018-10-19 NOTE — Progress Notes (Signed)
    NURSING HOME LOCATION:  [facility] ROOM NUMBER:  119-A  CODE STATUS:  Full Code  PCP:  [name], MD  [address] [place] [address]   This is a comprehensive admission note to [facility] performed on this date less than 30 days from date of admission. Included are preadmission medical/surgical history; reconciled medication list; family history; social history and comprehensive review of systems.  Corrections and additions to the records were documented. Comprehensive physical exam was also performed. Additionally a clinical summary was entered for each active diagnosis pertinent to this admission in the Problem List to enhance continuity of care.  HPI:    Past medical and surgical history:  Social history:  Family history:   Review of systems:  Could not be completed due to dementia. Date given as Constitutional: No fever, significant weight change, fatigue  Eyes: No redness, discharge, pain, vision change ENT/mouth: No nasal congestion, purulent discharge, earache, change in hearing, sore throat  Cardiovascular: No chest pain, palpitations, paroxysmal nocturnal dyspnea, claudication, edema  Respiratory: No cough, sputum production, hemoptysis, DOE, significant snoring, apnea Gastrointestinal: No heartburn, dysphagia, abdominal pain, nausea /vomiting, rectal bleeding, melena, change in bowels Genitourinary: No dysuria, hematuria, pyuria, incontinence, nocturia Musculoskeletal: No joint stiffness, joint swelling, weakness, pain Dermatologic: No rash, pruritus, change in appearance of skin Neurologic: No dizziness, headache, syncope, seizures, numbness, tingling Psychiatric: No significant anxiety, depression, insomnia, anorexia Endocrine: No change in hair/skin/nails, excessive thirst, excessive hunger, excessive urination  Hematologic/lymphatic: No significant bruising, lymphadenopathy, abnormal bleeding Allergy/immunology: No  itchy/watery eyes, significant sneezing, urticaria, angioedema  Physical exam:  Pertinent or positive findings: General appearance: Adequately nourished; no acute distress, increased work of breathing is present.   Lymphatic: No lymphadenopathy about the head, neck, axilla. Eyes: No conjunctival inflammation or lid edema is present. There is no scleral icterus. Ears:  External ear exam shows no significant lesions or deformities.   Nose:  External nasal examination shows no deformity or inflammation. Nasal mucosa are pink and moist without lesions, exudates Oral exam: Lips and gums are healthy appearing.There is no oropharyngeal erythema or exudate. Neck:  No thyromegaly, masses, tenderness noted.    Heart:  Normal rate and regular rhythm. S1 and S2 normal without gallop, murmur, click, rub.  Lungs: Chest clear to auscultation without wheezes, rhonchi, rales, rubs. Abdomen: Bowel sounds are normal.  Abdomen is soft and nontender with no organomegaly, hernias, masses. GU: Deferred  Extremities:  No cyanosis, clubbing, edema. Neurologic exam:  Strength equal  in upper & lower extremities. Balance, Rhomberg, finger to nose testing could not be completed due to clinical state Deep tendon reflexes are equal Skin: Warm & dry w/o tenting. No significant lesions or rash.  See clinical summary under each active problem in the Problem List with associated updated therapeutic plan.   This encounter was created in error - please disregard.

## 2018-10-19 NOTE — Assessment & Plan Note (Addendum)
Confirm meds as above in SNF AHT & transportation to Short Stay as scheduled

## 2018-10-19 NOTE — Assessment & Plan Note (Addendum)
He will be on high-dose prednisone with weaning every 2 weeks.  Undoubtedly this should exacerbate hyperglycemia. Discordance is expected with his ESRD.  Hemoglobin A1c was 4.8% on 10/04/2018. In the hospital glucoses ranged from a low of 89 up to a high of 222 Monitor glucoses @ SNF

## 2018-10-19 NOTE — Progress Notes (Signed)
NURSING HOME LOCATION:  [facility] ROOM NUMBER:  119-A  CODE STATUS:  Full Code  PCP:  [name], MD  [address] [place] [address]  This is a comprehensive admission note to [facility] performed on this date less than 30 days from date of admission. Included are preadmission medical/surgical history; reconciled medication list; family history; social history and comprehensive review of systems.  Corrections and additions to the records were documented. Comprehensive physical exam was also performed. Additionally a clinical summary was entered for each active diagnosis pertinent to this admission in the Problem List to enhance continuity of care.  HPI: The patient was hospitalized 12/8-12/20/2019,admitted from home with worsening cough, dyspnea and blood-tinged sputum.  He had been hospitalized 12/1-12/5 for presumed healthcare associated pneumonia.  Empirically at that time he received IV ceftriaxone and azithromycin.  He was discharged on cefdinir and azithromycin to complete a 7-day course. He has history of Wegener's granulomatosis.  He developed acute hypoxic respiratory failure with bilateral basilar opacities attributed to Wegener's exacerbation. Head CT scan was performed because of redness and proptosis of the left eye.  There was concern for granuloma formation related to Wegener's. Ophthalmology consultant recommended artificial tears. IR performed biopsy of a pulmonary nodule; path was consistent with granulomatosis.  He was started on IV Solu-Medrol pulse dosing under the guidance of pulmonary consultants. With a full 7-day course of IV cefepime and the steroids, there was significant improvement in his cough, dyspnea, and ophthalmologic erythema and edema. Rituximab infusion was started on 12/17. He was to be on a prednisone taper starting with 60 mg daily 12/19 x 2 weeks then 50 mg daily for 2 weeks then 40 mg daily for 2 weeks.   Pulmonology was to determine subsequent prednisone dosages.  Rituximab weekly for 4 weeks per RAVE initiated as of 12/17 as noted above.  Second dose was to be 12/26 at [facility]; dose three 10/30/2018 and final dose apparently 1/10  Past medical and surgical history: History of ventricular fibrillation, essential hypertension,+ PPD post 9 months of isoniazid, insulin-dependent diabetes, ESRD, dyslipidemia, and schizophrenia.  He has had an ICD paced  Social history: Former smoker, never drank.  Family history: Limited history reviewed   Review of systems: Communication was difficult because of his broken [place].  Although he is listed as [place] he seemed to indicate to me that he was from [place].  His persistent complaint is being "tired".  He indicated he was having some vague right upper quadrant and suprapubic discomfort intermittently.  He also describes a nonproductive cough with minimal sputum.  He seemed to indicate he had been having some dysuria as well without other GU symptoms or signs.  Constitutional: No fever, significant weight change  Eyes: No redness, discharge, pain, vision change ENT/mouth: No nasal congestion, purulent discharge, earache, change in hearing, sore throat  Cardiovascular: No chest pain, palpitations, paroxysmal nocturnal dyspnea, claudication, edema  Respiratory: No hemoptysis, DOE, significant snoring, apnea Gastrointestinal: No heartburn, dysphagia, nausea /vomiting, rectal bleeding, melena, change in bowels Genitourinary: No hematuria, pyuria, incontinence, nocturia Musculoskeletal: No joint stiffness, joint swelling, weakness, pain Dermatologic: No rash, pruritus, change in appearance of skin Neurologic: No dizziness, headache, syncope, seizures, numbness, tingling Psychiatric: No significant anxiety, depression, insomnia, anorexia Endocrine: No change in hair/skin/nails, excessive thirst, excessive hunger, excessive urination  Hematologic/lymphatic:  No significant bruising, lymphadenopathy, abnormal bleeding Allergy/immunology: No itchy/watery eyes, significant sneezing, urticaria, angioedema  Physical exam:  Pertinent or positive  findings: He has a full beard and mustache.  He has isolated caries with erosions to and below the gumline.  He has an intermittent nonproductive cough. Despite VS; pulse was normal.There is a mild bronchovesicular quality to his respirations.  Abdomen is protuberant.  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  He is asymmetrically weak, greatest in the left upper extremity.  General appearance: Adequately nourished; no acute distress, increased work of breathing is present.   Lymphatic: No lymphadenopathy about the head, neck, axilla. Eyes: No conjunctival inflammation or lid edema is present. There is no scleral icterus. Ears:  External ear exam shows no significant lesions or deformities.   Nose:  External nasal examination shows no deformity or inflammation. Nasal mucosa are pink and moist without lesions, exudates Oral exam: Lips and gums are healthy appearing.There is no oropharyngeal erythema or exudate. Neck:  No thyromegaly, masses, tenderness noted.    Heart:  Normal rate and regular rhythm. S1 and S2 normal without gallop, murmur, click, rub.  Lungs: without wheezes, rhonchi, rales, rubs. Abdomen: Bowel sounds are normal.  Abdomen is soft and nontender with no organomegaly, hernias, masses. GU: Deferred  Extremities:  No cyanosis, clubbing, edema. Neurologic exam: Deep tendon reflexes are equal Skin: Warm & dry w/o tenting. No significant rash.  See clinical summary under each active problem in the Problem List with associated updated therapeutic plan

## 2018-10-20 ENCOUNTER — Other Ambulatory Visit (HOSPITAL_COMMUNITY): Payer: Self-pay | Admitting: *Deleted

## 2018-10-20 DIAGNOSIS — E119 Type 2 diabetes mellitus without complications: Secondary | ICD-10-CM | POA: Diagnosis not present

## 2018-10-20 DIAGNOSIS — N186 End stage renal disease: Secondary | ICD-10-CM | POA: Diagnosis not present

## 2018-10-20 DIAGNOSIS — D631 Anemia in chronic kidney disease: Secondary | ICD-10-CM | POA: Diagnosis not present

## 2018-10-20 DIAGNOSIS — N2581 Secondary hyperparathyroidism of renal origin: Secondary | ICD-10-CM | POA: Diagnosis not present

## 2018-10-21 NOTE — Patient Instructions (Signed)
See assessment and plan under each diagnosis in the problem list and acutely for this visit 

## 2018-10-22 ENCOUNTER — Ambulatory Visit (HOSPITAL_COMMUNITY)
Admission: RE | Admit: 2018-10-22 | Discharge: 2018-10-22 | Disposition: A | Discharge status: Home / Self Care | Payer: Medicare Other | Source: Ambulatory Visit | Attending: Internal Medicine | Admitting: Internal Medicine

## 2018-10-22 DIAGNOSIS — M3130 Wegener's granulomatosis without renal involvement: Secondary | ICD-10-CM | POA: Diagnosis not present

## 2018-10-22 MED ORDER — ACETAMINOPHEN 325 MG PO TABS
650.0000 mg | ORAL_TABLET | ORAL | Status: DC
  Administered 2018-10-22: 650 mg via ORAL

## 2018-10-22 MED ORDER — DIPHENHYDRAMINE HCL 25 MG PO CAPS
ORAL_CAPSULE | ORAL | Status: AC
  Administered 2018-10-22: 25 mg via ORAL
  Filled 2018-10-22: qty 1

## 2018-10-22 MED ORDER — DIPHENHYDRAMINE HCL 25 MG PO CAPS
25.0000 mg | ORAL_CAPSULE | ORAL | Status: DC
  Administered 2018-10-22: 25 mg via ORAL

## 2018-10-22 MED ORDER — SODIUM CHLORIDE 0.9 % IV SOLN
375.0000 mg/m2 | Freq: Once | INTRAVENOUS | Status: AC
  Administered 2018-10-22: 700 mg via INTRAVENOUS
  Filled 2018-10-22: qty 70

## 2018-10-22 MED ORDER — ACETAMINOPHEN 325 MG PO TABS
ORAL_TABLET | ORAL | Status: AC
  Filled 2018-10-22: qty 2

## 2018-10-22 NOTE — Discharge Instructions (Signed)
Rituximab injection  What is this medicine?  RITUXIMAB (ri TUX i mab) is a monoclonal antibody. It is used to treat certain types of cancer like non-Hodgkin lymphoma and chronic lymphocytic leukemia. It is also used to treat rheumatoid arthritis, granulomatosis with polyangiitis (or Wegener's granulomatosis), microscopic polyangiitis, and pemphigus vulgaris.  This medicine may be used for other purposes; ask your health care provider or pharmacist if you have questions.  COMMON BRAND NAME(S): Rituxan  What should I tell my health care provider before I take this medicine?  They need to know if you have any of these conditions:  -heart disease  -infection (especially a virus infection such as hepatitis B, chickenpox, cold sores, or herpes)  -immune system problems  -irregular heartbeat  -kidney disease  -low blood counts, like low white cell, platelet, or red cell counts  -lung or breathing disease, like asthma  -recently received or scheduled to receive a vaccine  -an unusual or allergic reaction to rituximab, other medicines, foods, dyes, or preservatives  -pregnant or trying to get pregnant  -breast-feeding  How should I use this medicine?  This medicine is for infusion into a vein. It is administered in a hospital or clinic by a specially trained health care professional.  A special MedGuide will be given to you by the pharmacist with each prescription and refill. Be sure to read this information carefully each time.  Talk to your pediatrician regarding the use of this medicine in children. This medicine is not approved for use in children.  Overdosage: If you think you have taken too much of this medicine contact a poison control center or emergency room at once.  NOTE: This medicine is only for you. Do not share this medicine with others.  What if I miss a dose?  It is important not to miss a dose. Call your doctor or health care professional if you are unable to keep an appointment.  What may interact with  this medicine?  -cisplatin  -live virus vaccines  This list may not describe all possible interactions. Give your health care provider a list of all the medicines, herbs, non-prescription drugs, or dietary supplements you use. Also tell them if you smoke, drink alcohol, or use illegal drugs. Some items may interact with your medicine.  What should I watch for while using this medicine?  Your condition will be monitored carefully while you are receiving this medicine. You may need blood work done while you are taking this medicine.  This medicine can cause serious allergic reactions. To reduce your risk you may need to take medicine before treatment with this medicine. Take your medicine as directed.  In some patients, this medicine may cause a serious brain infection that may cause death. If you have any problems seeing, thinking, speaking, walking, or standing, tell your healthcare professional right away. If you cannot reach your healthcare professional, urgently seek other source of medical care.  Call your doctor or health care professional for advice if you get a fever, chills or sore throat, or other symptoms of a cold or flu. Do not treat yourself. This drug decreases your body's ability to fight infections. Try to avoid being around people who are sick.  Do not become pregnant while taking this medicine or for 12 months after stopping it. Women should inform their doctor if they wish to become pregnant or think they might be pregnant. There is a potential for serious side effects to an unborn child. Talk to your health   care professional or pharmacist for more information. Do not breast-feed an infant while taking this medicine or for 6 months after stopping it.  What side effects may I notice from receiving this medicine?  Side effects that you should report to your doctor or health care professional as soon as possible:  -allergic reactions like skin rash, itching or hives; swelling of the face, lips, or  tongue  -breathing problems  -chest pain  -changes in vision  -diarrhea  -headache with fever, neck stiffness, sensitivity to light, nausea, or confusion  -fast, irregular heartbeat  -loss of memory  -low blood counts - this medicine may decrease the number of white blood cells, red blood cells and platelets. You may be at increased risk for infections and bleeding.  -mouth sores  -problems with balance, talking, or walking  -redness, blistering, peeling or loosening of the skin, including inside the mouth  -signs of infection - fever or chills, cough, sore throat, pain or difficulty passing urine  -signs and symptoms of kidney injury like trouble passing urine or change in the amount of urine  -signs and symptoms of liver injury like dark yellow or brown urine; general ill feeling or flu-like symptoms; light-colored stools; loss of appetite; nausea; right upper belly pain; unusually weak or tired; yellowing of the eyes or skin  -signs and symptoms of low blood pressure like dizziness; feeling faint or lightheaded, falls; unusually weak or tired  -stomach pain  -swelling of the ankles, feet, hands  -unusual bleeding or bruising  -vomiting  Side effects that usually do not require medical attention (report to your doctor or health care professional if they continue or are bothersome):  -headache  -joint pain  -muscle cramps or muscle pain  -nausea  -tiredness  This list may not describe all possible side effects. Call your doctor for medical advice about side effects. You may report side effects to FDA at 1-800-FDA-1088.  Where should I keep my medicine?  This drug is given in a hospital or clinic and will not be stored at home.  NOTE: This sheet is a summary. It may not cover all possible information. If you have questions about this medicine, talk to your doctor, pharmacist, or health care provider.   2019 Elsevier/Gold Standard (2017-09-26 13:04:32)

## 2018-10-23 DIAGNOSIS — D631 Anemia in chronic kidney disease: Secondary | ICD-10-CM | POA: Diagnosis not present

## 2018-10-23 DIAGNOSIS — E119 Type 2 diabetes mellitus without complications: Secondary | ICD-10-CM | POA: Diagnosis not present

## 2018-10-23 DIAGNOSIS — N186 End stage renal disease: Secondary | ICD-10-CM | POA: Diagnosis not present

## 2018-10-23 DIAGNOSIS — N2581 Secondary hyperparathyroidism of renal origin: Secondary | ICD-10-CM | POA: Diagnosis not present

## 2018-10-25 DIAGNOSIS — D631 Anemia in chronic kidney disease: Secondary | ICD-10-CM | POA: Diagnosis not present

## 2018-10-25 DIAGNOSIS — E119 Type 2 diabetes mellitus without complications: Secondary | ICD-10-CM | POA: Diagnosis not present

## 2018-10-25 DIAGNOSIS — N2581 Secondary hyperparathyroidism of renal origin: Secondary | ICD-10-CM | POA: Diagnosis not present

## 2018-10-25 DIAGNOSIS — N186 End stage renal disease: Secondary | ICD-10-CM | POA: Diagnosis not present

## 2018-10-25 LAB — CUP PACEART REMOTE DEVICE CHECK
Battery Remaining Longevity: 128 mo
Battery Voltage: 3.04 V
Brady Statistic RV Percent Paced: 0.01 %
Date Time Interrogation Session: 20191029073325
HighPow Impedance: 53 Ohm
HighPow Impedance: 70 Ohm
Implantable Lead Implant Date: 20100415
Implantable Lead Location: 753860
Implantable Lead Model: 6947
Implantable Pulse Generator Implant Date: 20190208
Lead Channel Impedance Value: 1710 Ohm
Lead Channel Impedance Value: 532 Ohm
Lead Channel Pacing Threshold Amplitude: 2.5 V
Lead Channel Pacing Threshold Pulse Width: 0.4 ms
Lead Channel Sensing Intrinsic Amplitude: 5.875 mV
Lead Channel Sensing Intrinsic Amplitude: 5.875 mV
Lead Channel Setting Pacing Amplitude: 3.5 V
Lead Channel Setting Pacing Pulse Width: 0.8 ms
Lead Channel Setting Sensing Sensitivity: 0.3 mV

## 2018-10-26 ENCOUNTER — Inpatient Hospital Stay: Payer: Medicare Other | Admitting: Nurse Practitioner

## 2018-10-27 DIAGNOSIS — D631 Anemia in chronic kidney disease: Secondary | ICD-10-CM | POA: Diagnosis not present

## 2018-10-27 DIAGNOSIS — N2581 Secondary hyperparathyroidism of renal origin: Secondary | ICD-10-CM | POA: Diagnosis not present

## 2018-10-27 DIAGNOSIS — N186 End stage renal disease: Secondary | ICD-10-CM | POA: Diagnosis not present

## 2018-10-27 DIAGNOSIS — E119 Type 2 diabetes mellitus without complications: Secondary | ICD-10-CM | POA: Diagnosis not present

## 2018-10-28 DIAGNOSIS — N186 End stage renal disease: Secondary | ICD-10-CM | POA: Diagnosis not present

## 2018-10-28 DIAGNOSIS — Z992 Dependence on renal dialysis: Secondary | ICD-10-CM | POA: Diagnosis not present

## 2018-10-28 DIAGNOSIS — N032 Chronic nephritic syndrome with diffuse membranous glomerulonephritis: Secondary | ICD-10-CM | POA: Diagnosis not present

## 2018-10-30 ENCOUNTER — Ambulatory Visit (HOSPITAL_COMMUNITY)
Admission: RE | Admit: 2018-10-30 | Discharge: 2018-10-30 | Disposition: A | Discharge status: Home / Self Care | Payer: Medicare Other | Source: Ambulatory Visit | Attending: Internal Medicine | Admitting: Internal Medicine

## 2018-10-30 DIAGNOSIS — M3130 Wegener's granulomatosis without renal involvement: Secondary | ICD-10-CM | POA: Insufficient documentation

## 2018-10-30 DIAGNOSIS — D631 Anemia in chronic kidney disease: Secondary | ICD-10-CM | POA: Diagnosis not present

## 2018-10-30 DIAGNOSIS — D509 Iron deficiency anemia, unspecified: Secondary | ICD-10-CM | POA: Diagnosis not present

## 2018-10-30 DIAGNOSIS — N2581 Secondary hyperparathyroidism of renal origin: Secondary | ICD-10-CM | POA: Diagnosis not present

## 2018-10-30 DIAGNOSIS — N186 End stage renal disease: Secondary | ICD-10-CM | POA: Diagnosis not present

## 2018-10-30 DIAGNOSIS — E119 Type 2 diabetes mellitus without complications: Secondary | ICD-10-CM | POA: Diagnosis not present

## 2018-10-30 DIAGNOSIS — I776 Arteritis, unspecified: Secondary | ICD-10-CM | POA: Diagnosis not present

## 2018-10-30 MED ORDER — DIPHENHYDRAMINE HCL 25 MG PO CAPS
ORAL_CAPSULE | ORAL | Status: AC
  Filled 2018-10-30: qty 1

## 2018-10-30 MED ORDER — SODIUM CHLORIDE 0.9 % IV SOLN
375.0000 mg/m2 | Freq: Once | INTRAVENOUS | Status: AC
  Administered 2018-10-30: 700 mg via INTRAVENOUS
  Filled 2018-10-30: qty 70

## 2018-10-30 MED ORDER — ACETAMINOPHEN 325 MG PO TABS
650.0000 mg | ORAL_TABLET | ORAL | Status: DC
  Administered 2018-10-30: 650 mg via ORAL

## 2018-10-30 MED ORDER — DIPHENHYDRAMINE HCL 25 MG PO CAPS
25.0000 mg | ORAL_CAPSULE | ORAL | Status: DC
  Administered 2018-10-30: 25 mg via ORAL

## 2018-10-30 MED ORDER — ACETAMINOPHEN 325 MG PO TABS
ORAL_TABLET | ORAL | Status: AC
  Filled 2018-10-30: qty 2

## 2018-11-02 ENCOUNTER — Encounter (HOSPITAL_COMMUNITY): Payer: Medicare Other

## 2018-11-02 DIAGNOSIS — D631 Anemia in chronic kidney disease: Secondary | ICD-10-CM | POA: Diagnosis not present

## 2018-11-02 DIAGNOSIS — D509 Iron deficiency anemia, unspecified: Secondary | ICD-10-CM | POA: Diagnosis not present

## 2018-11-02 DIAGNOSIS — N186 End stage renal disease: Secondary | ICD-10-CM | POA: Diagnosis not present

## 2018-11-02 DIAGNOSIS — N2581 Secondary hyperparathyroidism of renal origin: Secondary | ICD-10-CM | POA: Diagnosis not present

## 2018-11-02 DIAGNOSIS — E119 Type 2 diabetes mellitus without complications: Secondary | ICD-10-CM | POA: Diagnosis not present

## 2018-11-04 DIAGNOSIS — D509 Iron deficiency anemia, unspecified: Secondary | ICD-10-CM | POA: Diagnosis not present

## 2018-11-04 DIAGNOSIS — N186 End stage renal disease: Secondary | ICD-10-CM | POA: Diagnosis not present

## 2018-11-04 DIAGNOSIS — E119 Type 2 diabetes mellitus without complications: Secondary | ICD-10-CM | POA: Diagnosis not present

## 2018-11-04 DIAGNOSIS — D631 Anemia in chronic kidney disease: Secondary | ICD-10-CM | POA: Diagnosis not present

## 2018-11-04 DIAGNOSIS — N2581 Secondary hyperparathyroidism of renal origin: Secondary | ICD-10-CM | POA: Diagnosis not present

## 2018-11-05 ENCOUNTER — Telehealth: Payer: Self-pay | Admitting: Internal Medicine

## 2018-11-05 NOTE — Telephone Encounter (Signed)
James Nicholson  Can you please double check if James Nicholson ended up getting all his rituxan as per schedule and ensure he has followup please with [place]  Antonietta Jewel    Dr. Brand Males, M.D., F.C.C.P,  Pulmonary and Critical Care Medicine Staff Physician, [place] Director - Interstitial Lung Disease  Program  Pulmonary [place] [facility] [place], [place], [address]  Pager: [phone], If no answer or between  15:00h - 7:00h: call [phone] Telephone: [phone]  8:38 AM 11/05/2018

## 2018-11-05 NOTE — Telephone Encounter (Signed)
I called both numbers on file but the home number was disconnected and the cell phone rang busy X 3. Will try again and leave encounter open.

## 2018-11-06 ENCOUNTER — Ambulatory Visit (HOSPITAL_COMMUNITY)
Admission: RE | Admit: 2018-11-06 | Discharge: 2018-11-06 | Disposition: A | Discharge status: Home / Self Care | Payer: Medicare Other | Source: Ambulatory Visit | Attending: Internal Medicine | Admitting: Internal Medicine

## 2018-11-06 DIAGNOSIS — E119 Type 2 diabetes mellitus without complications: Secondary | ICD-10-CM | POA: Diagnosis not present

## 2018-11-06 DIAGNOSIS — M3130 Wegener's granulomatosis without renal involvement: Secondary | ICD-10-CM | POA: Insufficient documentation

## 2018-11-06 DIAGNOSIS — N2581 Secondary hyperparathyroidism of renal origin: Secondary | ICD-10-CM | POA: Diagnosis not present

## 2018-11-06 DIAGNOSIS — D509 Iron deficiency anemia, unspecified: Secondary | ICD-10-CM | POA: Diagnosis not present

## 2018-11-06 DIAGNOSIS — I776 Arteritis, unspecified: Secondary | ICD-10-CM | POA: Insufficient documentation

## 2018-11-06 DIAGNOSIS — N186 End stage renal disease: Secondary | ICD-10-CM | POA: Diagnosis not present

## 2018-11-06 DIAGNOSIS — D631 Anemia in chronic kidney disease: Secondary | ICD-10-CM | POA: Diagnosis not present

## 2018-11-06 MED ORDER — DIPHENHYDRAMINE HCL 25 MG PO CAPS
ORAL_CAPSULE | ORAL | Status: AC
  Administered 2018-11-06: 25 mg
  Filled 2018-11-06: qty 1

## 2018-11-06 MED ORDER — ACETAMINOPHEN 325 MG PO TABS
ORAL_TABLET | ORAL | Status: AC
  Administered 2018-11-06: 650 mg
  Filled 2018-11-06: qty 2

## 2018-11-06 MED ORDER — SODIUM CHLORIDE 0.9 % IV SOLN
375.0000 mg/m2 | Freq: Once | INTRAVENOUS | Status: AC
  Administered 2018-11-06: 700 mg via INTRAVENOUS
  Filled 2018-11-06: qty 70

## 2018-11-06 MED ORDER — ACETAMINOPHEN 325 MG PO TABS: 650.0000 mg | ORAL_TABLET | ORAL | Status: DC

## 2018-11-06 MED ORDER — DIPHENHYDRAMINE HCL 25 MG PO CAPS: 25.0000 mg | ORAL_CAPSULE | ORAL | Status: DC

## 2018-11-09 ENCOUNTER — Encounter: Payer: Self-pay | Admitting: Adult Health

## 2018-11-09 ENCOUNTER — Non-Acute Institutional Stay (SKILLED_NURSING_FACILITY): Payer: Medicare Other | Admitting: Adult Health

## 2018-11-09 DIAGNOSIS — F2089 Other schizophrenia: Secondary | ICD-10-CM

## 2018-11-09 DIAGNOSIS — K219 Gastro-esophageal reflux disease without esophagitis: Secondary | ICD-10-CM | POA: Diagnosis not present

## 2018-11-09 DIAGNOSIS — G4709 Other insomnia: Secondary | ICD-10-CM

## 2018-11-09 DIAGNOSIS — Z794 Long term (current) use of insulin: Secondary | ICD-10-CM

## 2018-11-09 DIAGNOSIS — E119 Type 2 diabetes mellitus without complications: Secondary | ICD-10-CM

## 2018-11-09 DIAGNOSIS — D631 Anemia in chronic kidney disease: Secondary | ICD-10-CM | POA: Diagnosis not present

## 2018-11-09 DIAGNOSIS — M3131 Wegener's granulomatosis with renal involvement: Secondary | ICD-10-CM

## 2018-11-09 DIAGNOSIS — N186 End stage renal disease: Secondary | ICD-10-CM | POA: Diagnosis not present

## 2018-11-09 DIAGNOSIS — IMO0001 Reserved for inherently not codable concepts without codable children: Secondary | ICD-10-CM

## 2018-11-09 DIAGNOSIS — G629 Polyneuropathy, unspecified: Secondary | ICD-10-CM

## 2018-11-09 DIAGNOSIS — D509 Iron deficiency anemia, unspecified: Secondary | ICD-10-CM | POA: Diagnosis not present

## 2018-11-09 DIAGNOSIS — N2581 Secondary hyperparathyroidism of renal origin: Secondary | ICD-10-CM | POA: Diagnosis not present

## 2018-11-09 MED ORDER — GABAPENTIN 300 MG PO CAPS: 300.0000 mg | ORAL_CAPSULE | Freq: Two times a day (BID) | ORAL | 0 refills | Status: DC

## 2018-11-09 MED ORDER — PREDNISONE 20 MG PO TABS: 40.0000 mg | ORAL_TABLET | Freq: Every day | ORAL | 0 refills | Status: AC

## 2018-11-09 MED ORDER — CALCIUM ACETATE 667 MG PO CAPS: 1334.0000 mg | ORAL_CAPSULE | Freq: Three times a day (TID) | ORAL | 0 refills | Status: DC

## 2018-11-09 MED ORDER — TRAZODONE HCL 100 MG PO TABS: 100.0000 mg | ORAL_TABLET | Freq: Every day | ORAL | 0 refills | Status: DC

## 2018-11-09 MED ORDER — CALCITRIOL 0.5 MCG PO CAPS: 1.0000 ug | ORAL_CAPSULE | ORAL | 0 refills | Status: DC

## 2018-11-09 MED ORDER — SULFAMETHOXAZOLE-TRIMETHOPRIM 800-160 MG PO TABS: 1.0000 | ORAL_TABLET | ORAL | 0 refills | Status: AC

## 2018-11-09 MED ORDER — RISPERIDONE 0.25 MG PO TABS: 0.2500 mg | ORAL_TABLET | Freq: Every day | ORAL | 0 refills | Status: DC

## 2018-11-09 MED ORDER — PREDNISONE 50 MG PO TABS: 50.0000 mg | ORAL_TABLET | Freq: Every day | ORAL | 0 refills | Status: AC

## 2018-11-09 MED ORDER — INSULIN PEN NEEDLE 32G X 5 MM MISC: 1.0000 | Freq: Every day | 0 refills | Status: DC | PRN

## 2018-11-09 MED ORDER — INSULIN ASPART 100 UNIT/ML FLEXPEN: 0.0000 [IU] | PEN_INJECTOR | Freq: Three times a day (TID) | SUBCUTANEOUS | 0 refills | Status: DC

## 2018-11-09 MED ORDER — IPRATROPIUM-ALBUTEROL 0.5-2.5 (3) MG/3ML IN SOLN: 3.0000 mL | RESPIRATORY_TRACT | 0 refills | Status: DC | PRN

## 2018-11-09 NOTE — Progress Notes (Signed)
Location:  [facility] Room Number: 119-A Place of Service:  SNF (31) Provider:  Durenda Age, NP  Patient Care Team: James Mccreedy, MD as PCP - General (Internal Medicine)  Extended Emergency Contact Information Primary Emergency Contact: James Nicholson Address: [address], [place] [address] James Nicholson of [place] Phone: [phone] Mobile Phone: [phone] Relation: Brother  Code Status:  Full Code  Goals of care: Advanced Directive information Advanced Directives 10/07/2018  Does Patient Have a Medical Advance Directive? No  Would patient like information on creating a medical advance directive? No - Patient declined  Pre-existing out of facility DNR order (yellow form or pink MOST form) -     Chief Complaint  Patient presents with  . Discharge Note    Patient to discharge home on 11/09/2018    HPI:  Pt is a 53 y.o. Nicholson seen today for discharge.  He is to discharge home today, 11/09/2018, with home health PT and OT services.   He has been admitted to [facility] on 10/16/18. He was hospitalized on 10/04/2018 to 10/16/2018 due to worsening cough, dyspnea and blood-tinged sputum.  He was found to have orbital mass on CT scan upon evaluation of left eye redness/proptosis. There was concern for granuloma formation related to Wegener's.  Vision was intact so it was recommended to continue artificial tears per ophthalmology.  IR had obtained biopsy of pulmonary nodule initially concerning for malignancy but found to be consistent with granulomatosis.  He was a started on IV Solu-Medrol pulse dosing per pulmonary consultant.  He had completed 7 full days of IV cefepime.  He had significant improvement in his cough, shortness of breath, eye swelling and eye redness after initiating Solu-Medrol dosing.  Rituxan infusion was started on 12/17.  He was discharged on prednisone taper. He has a PMH of  ventricular fibrillation, essential hypertension, positive PPD treated with isoniazide, IDDM, ESRD, dyslipidemia, and schizophrenia.  Of note, he was hospitalized on 09/27/2018 to 10/01/18 for presumed HCAP and was empirically treated with IV ceftriaxone and azithromycin and was discharged on cefdinir and azithromycin to complete 7-day course.  Patient was admitted to this facility for short-term rehabilitation after the patient's recent hospitalization.  Patient has completed SNF rehabilitation and therapy has cleared the patient for discharge.   Past Medical History:  Diagnosis Date  . Anemia   . Atrial fibrillation   . Cardiomyopathy ([place]) 2010   Unclear Etiology: Last Echo 06/2009: EF 40-45%, severe Lateral & apical Hypokinesis (? CAD) ; Grade 2 DDysfxn. Mild conc LVH.   Marland Kitchen Cellulitis   . Chronic kidney disease (CKD), stage V ([place])    Dialysis Tue, Thurs, Sat  . Dyslipidemia   . Enterobacter sepsis ([place])   . Hypertension   . IDDM (insulin dependent diabetes mellitus) ([place]) 11/03/2014  . S/P ICD (internal cardiac defibrillator) procedure 2010   VT Arrest (in [place])  . Schizophrenia ([place])   . 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 AV FISTULA PLACEMENT Right 02/10/2013   Procedure: ARTERIOVENOUS (AV) FISTULA CREATION;  Surgeon: Rosetta Posner, MD;  Location: [facility] OR;  Service: Vascular;  Laterality: Right;  Right forearm radial/cephalic arterovenous fistula.   Marland Kitchen CARDIAC CATHETERIZATION  2010   [place]: In setting of VT arrest. Per brother's report, nonobstructive with no intervention  . CARDIAC DEFIBRILLATOR PLACEMENT  2010   [place]  . FISTULOGRAM  Right 08/09/2013   Procedure: FISTULOGRAM;  Surgeon: Angelia Mould, MD;  Location: [facility] CATH LAB;  Service: Cardiovascular;  Laterality: Right;  . ICD GENERATOR CHANGEOUT N/A 12/05/2017   Procedure: ICD GENERATOR CHANGEOUT;  Surgeon: Evans Lance, MD;  Location: [facility] CV  LAB;  Service: Cardiovascular;  Laterality: N/A;  . LIGATION OF COMPETING BRANCHES OF ARTERIOVENOUS FISTULA Right 08/13/2013   Procedure: LIGATION OF COMPETING BRANCHES X5 OF ARTERIOVENOUS FISTULA- RIGHT ARM;  Surgeon: Serafina Mitchell, MD;  Location: [facility] OR;  Service: Vascular;  Laterality: Right;    No Known Allergies  Outpatient Encounter Medications as of 11/09/2018  Medication Sig  . Amino Acids-Protein Hydrolys (FEEDING SUPPLEMENT, PRO-STAT SUGAR FREE 64,) LIQD Take 30 mLs by mouth 2 (two) times daily.  . calcitRIOL (ROCALTROL) 0.5 MCG capsule Take 2 capsules (1 mcg total) by mouth every Monday, Wednesday, and Friday with hemodialysis.  Marland Kitchen calcium acetate (PHOSLO) 667 MG capsule Take 1,334 mg by mouth 3 (three) times daily with meals. 8a, 12p, 5p  . gabapentin (NEURONTIN) 300 MG capsule Take 300 mg by mouth 2 (two) times daily.  . insulin aspart (NOVOLOG) 100 UNIT/ML injection Inject 0-9 Units into the skin 3 (three) times daily with meals. Dose as follows: CBG < 70: implement hypoglycemia protocol  CBG 70 - 120: 0 units  CBG 121 - 150: 0 units  CBG 151 - 200: 0 units  CBG 201 - 250: 2 units  CBG 251 - 300: 3 units  CBG 301 - 350: 4 units  CBG 351 - 400: 5 units  CBG > 400 Notify facility MD  . ipratropium-albuterol (DUONEB) 0.5-2.5 (3) MG/3ML SOLN Take 3 mLs by nebulization every 4 (four) hours as needed.  Marland Kitchen omeprazole (PRILOSEC) 20 MG capsule Take 20 mg by mouth every morning.   Derrill Memo ON 11/10/2018] predniSONE (DELTASONE) 50 MG tablet Take 50 mg by mouth daily with breakfast.  . risperiDONE (RISPERDAL) 0.25 MG tablet Take 0.25 mg by mouth at bedtime.   . sulfamethoxazole-trimethoprim (BACTRIM DS,SEPTRA DS) 800-160 MG tablet Take 1 tablet by mouth every Monday, Wednesday, and Friday at 8 PM.  . traZODone (DESYREL) 100 MG tablet Take 100 mg by mouth at bedtime.   No facility-administered encounter medications on file as of 11/09/2018.     Review of Systems  GENERAL: No change in  appetite, no fatigue, no weight changes, no fever, chills or weakness MOUTH and THROAT: Denies oral discomfort, gingival pain or bleeding, pain from teeth or hoarseness   RESPIRATORY: no cough, SOB, DOE, wheezing, hemoptysis CARDIAC: No chest pain, edema or palpitations GI: No abdominal pain, diarrhea, constipation, heart burn, nausea or vomiting GU: Denies dysuria, frequency, hematuria, incontinence, or discharge NEUROLOGICAL: Denies dizziness, syncope, numbness, or headache PSYCHIATRIC: Denies feelings of depression or anxiety. No report of hallucinations, insomnia, paranoia, or agitation   Immunization History  Administered Date(s) Administered  . Influenza Split 07/28/2012  . Influenza,inj,Quad PF,6+ Mos 12/22/2013   Pertinent  Health Maintenance Due  Topic Date Due  . FOOT EXAM  10/10/1976  . OPHTHALMOLOGY EXAM  10/10/1976  . URINE MICROALBUMIN  10/10/1976  . COLONOSCOPY  10/10/2016  . INFLUENZA VACCINE  05/28/2018  . HEMOGLOBIN A1C  04/05/2019   Fall Risk  03/30/2013  Falls in the past year? No    Vitals:   11/09/18 0934  BP: 122/68  Pulse: 88  Resp: 20  Temp: 98 F (36.7 C)  TempSrc: Oral  Weight: 168 lb 4.8 oz (76.3 kg)  Height: 5\' 8"  (1.727 m)   Body mass index is 25.59 kg/m.  Physical Exam  GENERAL APPEARANCE: Well nourished. In no acute distress. Normal body habitus SKIN:  Skin is warm and dry.  MOUTH and THROAT: Lips are without lesions. Oral mucosa is moist and without lesions.  RESPIRATORY: Breathing is even & unlabored, BS CTAB CARDIAC: RRR, no murmur,no extra heart sounds, no edema. Left chest ICD GI: Abdomen soft, normal BS, no masses, no tenderness EXTREMITIES:  Able to move X4 extremities, right forearm AV fistula + bruit/thrill NEUROLOGICAL: There is no tremor. Speech is clear. Alert and oriented X 3. PSYCHIATRIC:  Affect and behavior are appropriate    Labs reviewed: Recent Labs    10/07/18 0148 10/08/18 0142 10/09/18 0308   10/12/18 0736 10/13/18 0656 10/14/18 1307 10/16/18 0625  NA 138 138 138   < > 134* 134* 132* 137  K 4.4 3.9 4.5   < > 4.9 4.4 4.4 4.0  CL 95* 96* 96*   < > 90* 91* 91* 98  CO2 26 26 22    < > 18* 23 18* 22  GLUCOSE 124* 105* 116*   < > 222* 177* 177* 89  BUN 68* 29* 68*   < > 163* 86* 144* 118*  CREATININE 11.73* 6.54* 10.15*   < > 13.49* 8.08* 10.69* 9.48*  CALCIUM 9.1 8.5* 9.0   < > 9.5 9.0 8.7* 8.6*  MG 2.4 2.1 2.5*  --   --   --   --   --   PHOS  --   --   --   --  9.5*  --  10.0* 8.8*   < > = values in this interval not displayed.   Recent Labs    09/27/18 0856  10/04/18 1808 10/12/18 0736 10/14/18 1307 10/16/18 0625  AST 12*  --  17  --   --   --   ALT 11  --  30  --   --   --   ALKPHOS 55  --  51  --   --   --   BILITOT 1.1  --  1.0  --   --   --   PROT 7.8  --  7.7  --   --   --   ALBUMIN 3.2*   < > 2.6* 2.3* 2.3* 2.1*   < > = values in this interval not displayed.   Recent Labs    10/04/18 1808  10/05/18 0204 10/06/18 0154  10/13/18 0656 10/14/18 1307 10/16/18 0625  WBC 18.5*   < > 16.4* 13.5*   < > 15.7* 15.2* 22.2*  NEUTROABS 14.5*  --  12.3* 10.0*  --   --   --   --   HGB 9.2*   < > 8.5* 8.7*   < > 8.5* 8.6* 8.7*  HCT 30.6*   < > 28.2* 28.0*   < > 27.5* 26.3* 26.4*  MCV 99.7   < > 98.6 97.6   < > 95.5 96.0 96.0  PLT 332   < > 313 283   < > 296 296 369   < > = values in this interval not displayed.   Lab Results  Component Value Date   TSH 0.990 12/23/2013   Lab Results  Component Value Date   HGBA1C 4.8 10/04/2018   Lab Results  Component Value Date   CHOL 153 12/23/2013   HDL 25 (L) 12/23/2013   LDLCALC 104 (H) 12/23/2013   TRIG  120 12/23/2013   CHOLHDL 6.1 12/23/2013    Significant Diagnostic Results in last 30 days:  Dg Abd 1 View  Result Date: 10/15/2018 CLINICAL DATA:  Abdominal pain, possible ileus EXAM: ABDOMEN - 1 VIEW COMPARISON:  07/06/2016 FINDINGS: Scattered large and small bowel gas is noted. Retained fecal material is noted  consistent with a mild degree of constipation. No obstructive changes are seen. No free air is noted. No bony abnormality is noted. IMPRESSION: Mild constipation. Electronically Signed   By: Inez Catalina M.D.   On: 10/15/2018 16:14    Assessment/Plan  1. Granulomatosis with polyangiitis with renal involvement (HCC) - predniSONE (DELTASONE) 50 MG tablet; Take 1 tablet (50 mg total) by mouth daily with breakfast for 5 days.  Dispense: 5 tablet; Refill: 0 - sulfamethoxazole-trimethoprim (BACTRIM DS,SEPTRA DS) 800-160 MG tablet; Take 1 tablet by mouth every Monday, Wednesday, and Friday at 8 PM for 30 days.  Dispense: 12 tablet; Refill: 0 - predniSONE (DELTASONE) 20 MG tablet; Take 2 tablets (40 mg total) by mouth daily with breakfast for 14 days. Take 2 tablets to = 40 mg  Dispense: 28 tablet; Refill: 0 - Rituxan infusion was started on 12/17 - continue  Bactrim DS 1 tab PO Q M-W-F for prophylaxis due to immunosuppression - follow-up with pulmonary and PCP   2. IDDM (insulin dependent diabetes mellitus) (HCC) - insulin aspart (NOVOLOG FLEXPEN) 100 UNIT/ML FlexPen; Inject 0-5 Units into the skin 3 (three) times daily with meals. 201-250 = 2 units, 251-300 = 3 units, 301-350 = 4 units, 351-400 = 5 units  Dispense: 15 mL; Refill: 0 - Insulin Pen Needle 32G X 5 MM MISC; 1 each by Does not apply route daily as needed.  Dispense: 100 each; Refill: 0  3. Neuropathy - gabapentin (NEURONTIN) 300 MG capsule; Take 1 capsule (300 mg total) by mouth 2 (two) times daily.  Dispense: 60 capsule; Refill: 0  4. Other schizophrenia ([place]) - risperiDONE (RISPERDAL) 0.25 MG tablet; Take 1 tablet (0.25 mg total) by mouth at bedtime.  Dispense: 30 tablet; Refill: 0  5. Gastroesophageal reflux disease without esophagitis -Continue omeprazole 20 mg 1 capsule daily  6. ESRD (end stage renal disease) ([place]) -Hemodialysis 3 times a week (Mondays, Wednesdays, Fridays) - calcium acetate (PHOSLO) 667 MG capsule; Take 2  capsules (1,334 mg total) by mouth 3 (three) times daily with meals. 8a, 12p, 5p  Dispense: 180 capsule; Refill: 0 - calcitRIOL (ROCALTROL) 0.5 MCG capsule; Take 2 capsules (1 mcg total) by mouth every Monday, Wednesday, and Friday with hemodialysis.  Dispense: 24 capsule; Refill: 0  7. Other insomnia - traZODone (DESYREL) 100 MG tablet; Take 1 tablet (100 mg total) by mouth at bedtime.  Dispense: 30 tablet; Refill: 0    I have filled out patient's discharge paperwork and written prescriptions.  Patient will receive home health PT and OT.  DME provided:  None  Total discharge time: Greater than 30 minutes Greater than 50% was spent in counseling and coordination of care.   Discharge time involved coordination of the discharge process with social worker, nursing staff and therapy department. Medical justification for home health services verified.    Durenda Age, NP [facility] [phone] (Monday-Friday 8:00 a.m. - 5:00 p.m.) [phone] (after hours)

## 2018-11-10 DIAGNOSIS — E782 Mixed hyperlipidemia: Secondary | ICD-10-CM | POA: Diagnosis not present

## 2018-11-10 DIAGNOSIS — I10 Essential (primary) hypertension: Secondary | ICD-10-CM | POA: Diagnosis not present

## 2018-11-10 DIAGNOSIS — K219 Gastro-esophageal reflux disease without esophagitis: Secondary | ICD-10-CM | POA: Diagnosis not present

## 2018-11-10 DIAGNOSIS — E1122 Type 2 diabetes mellitus with diabetic chronic kidney disease: Secondary | ICD-10-CM | POA: Diagnosis not present

## 2018-11-10 DIAGNOSIS — E1140 Type 2 diabetes mellitus with diabetic neuropathy, unspecified: Secondary | ICD-10-CM | POA: Diagnosis not present

## 2018-11-10 NOTE — Telephone Encounter (Signed)
I think the patient is in a SNF. We need to be able to get hold of him and get him in. Please look at epic and see where he is and call that SNF. If not, call his family please. He has severe vasculitis

## 2018-11-11 DIAGNOSIS — N186 End stage renal disease: Secondary | ICD-10-CM | POA: Diagnosis not present

## 2018-11-11 DIAGNOSIS — E119 Type 2 diabetes mellitus without complications: Secondary | ICD-10-CM | POA: Diagnosis not present

## 2018-11-11 DIAGNOSIS — N2581 Secondary hyperparathyroidism of renal origin: Secondary | ICD-10-CM | POA: Diagnosis not present

## 2018-11-11 DIAGNOSIS — D631 Anemia in chronic kidney disease: Secondary | ICD-10-CM | POA: Diagnosis not present

## 2018-11-11 DIAGNOSIS — D509 Iron deficiency anemia, unspecified: Secondary | ICD-10-CM | POA: Diagnosis not present

## 2018-11-11 NOTE — Telephone Encounter (Signed)
Message will be routed to [facility] for follow up per triage protocol.

## 2018-11-11 NOTE — Telephone Encounter (Signed)
Looked at pt's appointment tab at past appointments and saw that pt had the Rituxan that was done while he was in the hospital.  Pt also received a Rituxan infusion 1/3 and another one on 1/10.

## 2018-11-12 ENCOUNTER — Encounter: Payer: Self-pay | Admitting: Internal Medicine

## 2018-11-12 ENCOUNTER — Ambulatory Visit (INDEPENDENT_AMBULATORY_CARE_PROVIDER_SITE_OTHER): Payer: Medicare Other | Admitting: Internal Medicine

## 2018-11-12 VITALS — BP 118/68 | HR 81 | Ht 68.0 in | Wt 170.4 lb

## 2018-11-12 DIAGNOSIS — H0589 Other disorders of orbit: Secondary | ICD-10-CM

## 2018-11-12 DIAGNOSIS — R918 Other nonspecific abnormal finding of lung field: Secondary | ICD-10-CM

## 2018-11-12 DIAGNOSIS — Z7952 Long term (current) use of systemic steroids: Secondary | ICD-10-CM | POA: Diagnosis not present

## 2018-11-12 DIAGNOSIS — D631 Anemia in chronic kidney disease: Secondary | ICD-10-CM | POA: Diagnosis not present

## 2018-11-12 DIAGNOSIS — F209 Schizophrenia, unspecified: Secondary | ICD-10-CM | POA: Diagnosis not present

## 2018-11-12 DIAGNOSIS — Z95810 Presence of automatic (implantable) cardiac defibrillator: Secondary | ICD-10-CM | POA: Diagnosis not present

## 2018-11-12 DIAGNOSIS — E1140 Type 2 diabetes mellitus with diabetic neuropathy, unspecified: Secondary | ICD-10-CM | POA: Diagnosis not present

## 2018-11-12 DIAGNOSIS — M3131 Wegener's granulomatosis with renal involvement: Secondary | ICD-10-CM

## 2018-11-12 DIAGNOSIS — Z79899 Other long term (current) drug therapy: Secondary | ICD-10-CM

## 2018-11-12 DIAGNOSIS — R911 Solitary pulmonary nodule: Secondary | ICD-10-CM | POA: Diagnosis not present

## 2018-11-12 DIAGNOSIS — N186 End stage renal disease: Secondary | ICD-10-CM | POA: Diagnosis not present

## 2018-11-12 DIAGNOSIS — F17211 Nicotine dependence, cigarettes, in remission: Secondary | ICD-10-CM | POA: Diagnosis not present

## 2018-11-12 DIAGNOSIS — Z227 Latent tuberculosis: Secondary | ICD-10-CM | POA: Diagnosis not present

## 2018-11-12 DIAGNOSIS — R0982 Postnasal drip: Secondary | ICD-10-CM

## 2018-11-12 DIAGNOSIS — E1122 Type 2 diabetes mellitus with diabetic chronic kidney disease: Secondary | ICD-10-CM | POA: Diagnosis not present

## 2018-11-12 DIAGNOSIS — Z992 Dependence on renal dialysis: Secondary | ICD-10-CM | POA: Diagnosis not present

## 2018-11-12 DIAGNOSIS — I120 Hypertensive chronic kidney disease with stage 5 chronic kidney disease or end stage renal disease: Secondary | ICD-10-CM | POA: Diagnosis not present

## 2018-11-12 DIAGNOSIS — I4901 Ventricular fibrillation: Secondary | ICD-10-CM | POA: Diagnosis not present

## 2018-11-12 DIAGNOSIS — Z794 Long term (current) use of insulin: Secondary | ICD-10-CM | POA: Diagnosis not present

## 2018-11-12 NOTE — Telephone Encounter (Signed)
Seen him 11/12/2018

## 2018-11-12 NOTE — Progress Notes (Signed)
HPI James Nicholson 53 y.o. -  53 year old with history of Wegener's disease, renal failure.  Biopsy-proven ANCA positive necrotizing glomerulonephritis.Former smoker , quit 2010, no pack year history documented. Moved to [place] from [place] in 2010. ESRD on HD since 2014 or 2016   EVENTS 10/04/2018>> Hospital Admission\ 10/04/2018 CT chest- bilateral patchy airspace disease, 17 cm left apical nodule, aortic atherosclerosi 12/11- CT guided biopsy of apical mass> granulomatous inflammation with neutrophilic micro-abscesses   12/12- Readmission; CT head 3 cm right orbital mass with significant proptosis. Imaging appearance is nonspecific but in this clinical setting findings consistent with orbital granuloma from the James Nicholson's granulomatous polyangiitis. The mass is contiguous with active sinus disease via Nicholson defect in the medial lower orbit. 2. Left mastoid and middle ear opacification with negative Nasopharynx.  PCCM consulted 12/12 slow to resolve symptoms.  12/16 - says vision better and says hemoptysis reduced. ? History can be trusted due to language barrier. Per RN at bedside - no rituxan given since admission 10/04/2018  12/15 - 12/18 - 1gm Solumedrol and then start prednisone 60mg  per day 10/15/18 x 2 weeks and then 50mg  daily x 2 weeks and then  40 mg/day for 2 weeks. .  Subsequently, they will reduce the dose in Nicholson stepwise fashion every 2 weeks to 30 mg/d, 20 mg/d, 15 mg/d, 10 mg/d, 7.5 mg/d, 5 mg/d, 2.5 mg/d, until the James Nicholson is completely off prednisone. The entire tapering process will require 20 weeks.   12/17 - start Bactrim prophylasix. Rituxan  - Q1 week x 4 weeks per RAVE trial at 375mg /meter square 12/26  - Riuxan 10/30/2018 - Rituxan 11/06/2018 - Rituxan   OV 11/12/2018  Chief Complaint  James Nicholson presents with  . Hospitalization Follow-up    James Nicholson has received Rituxan 12/17, 12/26, 1/3, and 1/10 for the severe vasculitis.  James Nicholson states James Nicholson is feeling better since the  hospitalization. James Nicholson does have some pain on the right side from abdomen up to rib cage. Denies any complaints of cough, SOB, or CP.     James Nicholson presents with his brother, his caretaker and interpreter James Nicholson.  As best as I can gather James Nicholson continues to do well without any vision problems or hemoptysis.  James Nicholson continues to have right upper quadrant right lateral flank pain that is intermittent and constant.  This is been going on for several months and has not responded to the Rituxan and prednisone treatment.  Currently is on prednisone taper as outlined above.  James Nicholson is also on Bactrim prophylaxis.  James Nicholson is finished his Rituxan per schedule.  James Nicholson feels good overall.  No hemoptysis.  And no vision issues.  According to the brother who is the DPOAE James Nicholson is in SNF because of mental issues and memory loss.  It seems that when the [place] war took place James Nicholson was not even aware that there was Nicholson war ongoing  ROS - per HPI     has Nicholson past medical history of Anemia, Atrial fibrillation, Cardiomyopathy ([place]) (2010), Cellulitis, Chronic kidney disease (CKD), stage V ([place]), Dyslipidemia, Enterobacter sepsis ([place]), Hypertension, IDDM (insulin dependent diabetes mellitus) ([place]) (11/03/2014), S/P ICD (internal cardiac defibrillator) procedure (2010), Schizophrenia ([place]), and Wegener's disease, pulmonary ([place]) (02/05/2013).   reports that James Nicholson quit smoking about 10 years ago. James Nicholson has never used smokeless tobacco.  Past Surgical History:  Procedure Laterality Date  . AV FISTULA PLACEMENT Right 02/10/2013   Procedure: ARTERIOVENOUS (AV) FISTULA CREATION;  Surgeon: Rosetta Posner,  MD;  Location: [facility] OR;  Service: Vascular;  Laterality: Right;  Right forearm radial/cephalic arterovenous fistula.   Marland Kitchen CARDIAC CATHETERIZATION  2010   [place]: In setting of VT arrest. Per brother's report, nonobstructive with no intervention  . CARDIAC DEFIBRILLATOR PLACEMENT  2010   [place]  . FISTULOGRAM Right 08/09/2013    Procedure: FISTULOGRAM;  Surgeon: Angelia Mould, MD;  Location: [facility] CATH LAB;  Service: Cardiovascular;  Laterality: Right;  . ICD GENERATOR CHANGEOUT N/Nicholson 12/05/2017   Procedure: ICD GENERATOR CHANGEOUT;  Surgeon: Evans Lance, MD;  Location: [facility] CV LAB;  Service: Cardiovascular;  Laterality: N/Nicholson;  . LIGATION OF COMPETING BRANCHES OF ARTERIOVENOUS FISTULA Right 08/13/2013   Procedure: LIGATION OF COMPETING BRANCHES X5 OF ARTERIOVENOUS FISTULA- RIGHT ARM;  Surgeon: Serafina Mitchell, MD;  Location: [facility] OR;  Service: Vascular;  Laterality: Right;    No Known Allergies  Immunization History  Administered Date(s) Administered  . Influenza Split 07/28/2012  . Influenza,inj,Quad PF,6+ Mos 12/22/2013    Family History  Problem Relation Age of Onset  . CAD Mother      Current Outpatient Medications:  .  Amino Acids-Protein Hydrolys (FEEDING SUPPLEMENT, PRO-STAT SUGAR FREE 64,) LIQD, Take 30 mLs by mouth 2 (two) times daily., Disp: , Rfl:  .  calcitRIOL (ROCALTROL) 0.5 MCG capsule, Take 2 capsules (1 mcg total) by mouth every Monday, Wednesday, and Friday with hemodialysis., Disp: 24 capsule, Rfl: 0 .  calcium acetate (PHOSLO) 667 MG capsule, Take 2 capsules (1,334 mg total) by mouth 3 (three) times daily with meals. 8a, 12p, 5p, Disp: 180 capsule, Rfl: 0 .  gabapentin (NEURONTIN) 300 MG capsule, Take 1 capsule (300 mg total) by mouth 2 (two) times daily., Disp: 60 capsule, Rfl: 0 .  insulin aspart (NOVOLOG FLEXPEN) 100 UNIT/ML FlexPen, Inject 0-5 Units into the skin 3 (three) times daily with meals. 201-250 = 2 units, 251-300 = 3 units, 301-350 = 4 units, 351-400 = 5 units, Disp: 15 mL, Rfl: 0 .  Insulin Pen Needle 32G X 5 MM MISC, 1 each by Does not apply route daily as needed., Disp: 100 each, Rfl: 0 .  ipratropium-albuterol (DUONEB) 0.5-2.5 (3) MG/3ML SOLN, Take 3 mLs by nebulization every 4 (four) hours as needed for up to 30 days., Disp: 360 mL, Rfl: 0 .  omeprazole (PRILOSEC)  20 MG capsule, Take 20 mg by mouth every morning. , Disp: , Rfl:  .  predniSONE (DELTASONE) 50 MG tablet, Take 1 tablet (50 mg total) by mouth daily with breakfast for 5 days., Disp: 5 tablet, Rfl: 0 .  risperiDONE (RISPERDAL) 0.25 MG tablet, Take 1 tablet (0.25 mg total) by mouth at bedtime., Disp: 30 tablet, Rfl: 0 .  sulfamethoxazole-trimethoprim (BACTRIM DS,SEPTRA DS) 800-160 MG tablet, Take 1 tablet by mouth every Monday, Wednesday, and Friday at 8 PM for 30 days., Disp: 12 tablet, Rfl: 0 .  traZODone (DESYREL) 100 MG tablet, Take 1 tablet (100 mg total) by mouth at bedtime., Disp: 30 tablet, Rfl: 0 .  [START ON 11/15/2018] predniSONE (DELTASONE) 20 MG tablet, Take 2 tablets (40 mg total) by mouth daily with breakfast for 14 days. Take 2 tablets to = 40 mg (James Nicholson not taking: Reported on 11/12/2018), Disp: 28 tablet, Rfl: 0      Objective:   Vitals:   11/12/18 1211  BP: 118/68  Pulse: 81  SpO2: 97%  Weight: 170 lb 6.4 oz (77.3 kg)  Height: 5\' 8"  (1.727 m)    Estimated body  mass index is 25.91 kg/m as calculated from the following:   Height as of this encounter: 5\' 8"  (1.727 m).   Weight as of this encounter: 170 lb 6.4 oz (77.3 kg).  @WEIGHTCHANGE @  [employer]   11/12/18 1211  Weight: 170 lb 6.4 oz (77.3 kg)     Physical Exam  General Appearance:    Alert, cooperative, no distress, appears stated age - yes , Deconditioned looking - no , OBESE  - no, Sitting on Wheelchair -  no  Head:    Normocephalic, without obvious abnormality, atraumatic  Eyes:    PERRL, conjunctiva/corneas clear,  Ears:    Normal TM's and external ear canals, both ears  Nose:   Nares normal, septum midline, mucosa normal, no drainage    or sinus tenderness. OXYGEN ON  - no . James Nicholson is @ ra   Throat:   Lips, mucosa, and tongue normal; teeth and gums normal. Cyanosis on lips - no  Neck:   Supple, symmetrical, trachea midline, no adenopathy;    thyroid:  no enlargement/tenderness/nodules; no  carotid   bruit or JVD  Back:     Symmetric, no curvature, ROM normal, no CVA tenderness  Lungs:     Distress - no , Wheeze no, Barrell Chest - no, Purse lip breathing - no, Crackles - no   Chest Wall:    No tenderness or deformity.    Heart:    Regular rate and rhythm, S1 and S2 normal, no rub   or gallop, Murmur - no  Breast Exam:    NOT DONE  Abdomen:     Soft, non-tender, bowel sounds active all four quadrants,    no masses, no organomegaly. Visceral obesity - no  Genitalia:   NOT DONE  Rectal:   NOT DONE  Extremities:   Extremities - normal, Has Cane - no, Clubbing - no, Edema - no  Pulses:   2+ and symmetric all extremities  Skin:   Stigmata of Connective Tissue Disease - no  Lymph nodes:   Cervical, supraclavicular, and axillary nodes normal  Psychiatric:  Neurologic:   Pleasant - yes, Anxious - no, Flat affect - no  CAm-ICU - neg, Alert and Oriented x 3 - yes, Moves all 4s - yes, Speech - normal, Cognition - intact           Assessment:       ICD-10-CM   1. Granulomatosis with polyangiitis with renal involvement (HCC) M31.31   2. Orbital mass H05.89 CANCELED: CT Maxillofacial LTD WO CM  3. Lung nodules--left upper lobe and left posterior lobe- need to rule out malignancy R91.8 CANCELED: CT Chest Wo Contrast  4. Solitary pulmonary nodule R91.1 CT Chest Wo Contrast  5. Postnasal drip R09.82 CT Maxillofacial LTD WO CM  6. High risk medication use Z79.899        Plan:     James Nicholson Instructions     ICD-10-CM   1. Granulomatosis with polyangiitis with renal involvement ([place]) M31.31   2. Orbital mass H05.89   3. Lung nodules--left upper lobe and left posterior lobe- need to rule out malignancy R91.8     Glad doing well  Plan Continue prednisone taper as outlined Continue bactrim as before  Followup CT chest and maxillofacial in 3 months Return to regular clinic in 3 months or sooner if needed   > 50% of this > 25 min visit spent in face to face counseling or  coordination of care - by this undersigned  MD - Dr Brand Males. This includes one or more of the following documented above: discussion of test results, diagnostic or treatment recommendations, prognosis, risks and benefits of management options, instructions, education, compliance or risk-factor reduction   SIGNATURE    Dr. Brand Males, M.D., F.C.C.P,  Pulmonary and Critical Care Medicine Staff Physician, [place] Director - Interstitial Lung Disease  Program  [facility] [place], [place], [address]  Pager: [phone], If no answer or between  15:00h - 7:00h: call [phone] Telephone: [phone]  12:56 PM 11/12/2018

## 2018-11-12 NOTE — Patient Instructions (Signed)
ICD-10-CM   1. Granulomatosis with polyangiitis with renal involvement ([place]) M31.31   2. Orbital mass H05.89   3. Lung nodules--left upper lobe and left posterior lobe- need to rule out malignancy R91.8     Glad doing well  Plan Continue prednisone taper as outlined Continue bactrim as before  Followup CT chest and maxillofacial in 3 months Return to regular clinic in 3 months or sooner if needed

## 2018-11-13 DIAGNOSIS — D631 Anemia in chronic kidney disease: Secondary | ICD-10-CM | POA: Diagnosis not present

## 2018-11-13 DIAGNOSIS — E119 Type 2 diabetes mellitus without complications: Secondary | ICD-10-CM | POA: Diagnosis not present

## 2018-11-13 DIAGNOSIS — N186 End stage renal disease: Secondary | ICD-10-CM | POA: Diagnosis not present

## 2018-11-13 DIAGNOSIS — D509 Iron deficiency anemia, unspecified: Secondary | ICD-10-CM | POA: Diagnosis not present

## 2018-11-13 DIAGNOSIS — N2581 Secondary hyperparathyroidism of renal origin: Secondary | ICD-10-CM | POA: Diagnosis not present

## 2018-11-16 DIAGNOSIS — N2581 Secondary hyperparathyroidism of renal origin: Secondary | ICD-10-CM | POA: Insufficient documentation

## 2018-11-16 DIAGNOSIS — D509 Iron deficiency anemia, unspecified: Secondary | ICD-10-CM | POA: Insufficient documentation

## 2018-11-16 DIAGNOSIS — R197 Diarrhea, unspecified: Secondary | ICD-10-CM | POA: Insufficient documentation

## 2018-11-16 DIAGNOSIS — R0602 Shortness of breath: Secondary | ICD-10-CM | POA: Insufficient documentation

## 2018-11-16 DIAGNOSIS — R52 Pain, unspecified: Secondary | ICD-10-CM | POA: Insufficient documentation

## 2018-11-16 DIAGNOSIS — D689 Coagulation defect, unspecified: Secondary | ICD-10-CM | POA: Insufficient documentation

## 2018-11-16 DIAGNOSIS — E8779 Other fluid overload: Secondary | ICD-10-CM | POA: Insufficient documentation

## 2018-11-16 DIAGNOSIS — Z23 Encounter for immunization: Secondary | ICD-10-CM | POA: Insufficient documentation

## 2018-11-16 DIAGNOSIS — L299 Pruritus, unspecified: Secondary | ICD-10-CM | POA: Insufficient documentation

## 2018-11-17 DIAGNOSIS — N186 End stage renal disease: Secondary | ICD-10-CM | POA: Diagnosis not present

## 2018-11-17 DIAGNOSIS — E1129 Type 2 diabetes mellitus with other diabetic kidney complication: Secondary | ICD-10-CM | POA: Diagnosis not present

## 2018-11-17 DIAGNOSIS — N2581 Secondary hyperparathyroidism of renal origin: Secondary | ICD-10-CM | POA: Diagnosis not present

## 2018-11-17 DIAGNOSIS — D509 Iron deficiency anemia, unspecified: Secondary | ICD-10-CM | POA: Diagnosis not present

## 2018-11-18 DIAGNOSIS — I120 Hypertensive chronic kidney disease with stage 5 chronic kidney disease or end stage renal disease: Secondary | ICD-10-CM | POA: Diagnosis not present

## 2018-11-18 DIAGNOSIS — E1122 Type 2 diabetes mellitus with diabetic chronic kidney disease: Secondary | ICD-10-CM | POA: Diagnosis not present

## 2018-11-18 DIAGNOSIS — E1140 Type 2 diabetes mellitus with diabetic neuropathy, unspecified: Secondary | ICD-10-CM | POA: Diagnosis not present

## 2018-11-18 DIAGNOSIS — D631 Anemia in chronic kidney disease: Secondary | ICD-10-CM | POA: Diagnosis not present

## 2018-11-18 DIAGNOSIS — N186 End stage renal disease: Secondary | ICD-10-CM | POA: Diagnosis not present

## 2018-11-18 DIAGNOSIS — M3131 Wegener's granulomatosis with renal involvement: Secondary | ICD-10-CM | POA: Diagnosis not present

## 2018-11-19 ENCOUNTER — Encounter (HOSPITAL_COMMUNITY): Payer: Medicare Other

## 2018-11-19 DIAGNOSIS — N2581 Secondary hyperparathyroidism of renal origin: Secondary | ICD-10-CM | POA: Diagnosis not present

## 2018-11-19 DIAGNOSIS — E1129 Type 2 diabetes mellitus with other diabetic kidney complication: Secondary | ICD-10-CM | POA: Diagnosis not present

## 2018-11-19 DIAGNOSIS — D509 Iron deficiency anemia, unspecified: Secondary | ICD-10-CM | POA: Diagnosis not present

## 2018-11-19 DIAGNOSIS — N186 End stage renal disease: Secondary | ICD-10-CM | POA: Diagnosis not present

## 2018-11-20 ENCOUNTER — Other Ambulatory Visit: Payer: Self-pay | Admitting: Adult Health

## 2018-11-20 DIAGNOSIS — M3131 Wegener's granulomatosis with renal involvement: Secondary | ICD-10-CM

## 2018-11-20 DIAGNOSIS — I120 Hypertensive chronic kidney disease with stage 5 chronic kidney disease or end stage renal disease: Secondary | ICD-10-CM | POA: Diagnosis not present

## 2018-11-20 DIAGNOSIS — E1122 Type 2 diabetes mellitus with diabetic chronic kidney disease: Secondary | ICD-10-CM | POA: Diagnosis not present

## 2018-11-20 DIAGNOSIS — E1140 Type 2 diabetes mellitus with diabetic neuropathy, unspecified: Secondary | ICD-10-CM | POA: Diagnosis not present

## 2018-11-20 DIAGNOSIS — N186 End stage renal disease: Secondary | ICD-10-CM | POA: Diagnosis not present

## 2018-11-20 DIAGNOSIS — F2089 Other schizophrenia: Secondary | ICD-10-CM

## 2018-11-20 DIAGNOSIS — G4709 Other insomnia: Secondary | ICD-10-CM

## 2018-11-20 DIAGNOSIS — D631 Anemia in chronic kidney disease: Secondary | ICD-10-CM | POA: Diagnosis not present

## 2018-11-21 DIAGNOSIS — E1129 Type 2 diabetes mellitus with other diabetic kidney complication: Secondary | ICD-10-CM | POA: Diagnosis not present

## 2018-11-21 DIAGNOSIS — N186 End stage renal disease: Secondary | ICD-10-CM | POA: Diagnosis not present

## 2018-11-21 DIAGNOSIS — D509 Iron deficiency anemia, unspecified: Secondary | ICD-10-CM | POA: Diagnosis not present

## 2018-11-21 DIAGNOSIS — N2581 Secondary hyperparathyroidism of renal origin: Secondary | ICD-10-CM | POA: Diagnosis not present

## 2018-11-23 DIAGNOSIS — I120 Hypertensive chronic kidney disease with stage 5 chronic kidney disease or end stage renal disease: Secondary | ICD-10-CM | POA: Diagnosis not present

## 2018-11-23 DIAGNOSIS — M3131 Wegener's granulomatosis with renal involvement: Secondary | ICD-10-CM | POA: Diagnosis not present

## 2018-11-23 DIAGNOSIS — N186 End stage renal disease: Secondary | ICD-10-CM | POA: Diagnosis not present

## 2018-11-23 DIAGNOSIS — E1140 Type 2 diabetes mellitus with diabetic neuropathy, unspecified: Secondary | ICD-10-CM | POA: Diagnosis not present

## 2018-11-23 DIAGNOSIS — E1122 Type 2 diabetes mellitus with diabetic chronic kidney disease: Secondary | ICD-10-CM | POA: Diagnosis not present

## 2018-11-23 DIAGNOSIS — D631 Anemia in chronic kidney disease: Secondary | ICD-10-CM | POA: Diagnosis not present

## 2018-11-24 DIAGNOSIS — N186 End stage renal disease: Secondary | ICD-10-CM | POA: Diagnosis not present

## 2018-11-24 DIAGNOSIS — N2581 Secondary hyperparathyroidism of renal origin: Secondary | ICD-10-CM | POA: Diagnosis not present

## 2018-11-24 DIAGNOSIS — E1129 Type 2 diabetes mellitus with other diabetic kidney complication: Secondary | ICD-10-CM | POA: Diagnosis not present

## 2018-11-24 DIAGNOSIS — D509 Iron deficiency anemia, unspecified: Secondary | ICD-10-CM | POA: Diagnosis not present

## 2018-11-24 DIAGNOSIS — E441 Mild protein-calorie malnutrition: Secondary | ICD-10-CM | POA: Insufficient documentation

## 2018-11-26 DIAGNOSIS — N186 End stage renal disease: Secondary | ICD-10-CM | POA: Diagnosis not present

## 2018-11-26 DIAGNOSIS — N2581 Secondary hyperparathyroidism of renal origin: Secondary | ICD-10-CM | POA: Diagnosis not present

## 2018-11-26 DIAGNOSIS — E1129 Type 2 diabetes mellitus with other diabetic kidney complication: Secondary | ICD-10-CM | POA: Diagnosis not present

## 2018-11-26 DIAGNOSIS — D509 Iron deficiency anemia, unspecified: Secondary | ICD-10-CM | POA: Diagnosis not present

## 2018-11-27 ENCOUNTER — Telehealth: Payer: Self-pay | Admitting: Internal Medicine

## 2018-11-27 DIAGNOSIS — M3131 Wegener's granulomatosis with renal involvement: Secondary | ICD-10-CM | POA: Diagnosis not present

## 2018-11-27 DIAGNOSIS — E1140 Type 2 diabetes mellitus with diabetic neuropathy, unspecified: Secondary | ICD-10-CM | POA: Diagnosis not present

## 2018-11-27 DIAGNOSIS — I120 Hypertensive chronic kidney disease with stage 5 chronic kidney disease or end stage renal disease: Secondary | ICD-10-CM | POA: Diagnosis not present

## 2018-11-27 DIAGNOSIS — D631 Anemia in chronic kidney disease: Secondary | ICD-10-CM | POA: Diagnosis not present

## 2018-11-27 DIAGNOSIS — N186 End stage renal disease: Secondary | ICD-10-CM | POA: Diagnosis not present

## 2018-11-27 DIAGNOSIS — E1122 Type 2 diabetes mellitus with diabetic chronic kidney disease: Secondary | ICD-10-CM | POA: Diagnosis not present

## 2018-11-27 NOTE — Telephone Encounter (Signed)
Called and spoke with [name] [facility].  She just wanted to send a update on Patient.  She stated that home health will finish next week. Nothing further needed at this time.   Will route to Dr [name] as Juluis Rainier

## 2018-11-27 NOTE — Telephone Encounter (Signed)
Ok thanks 

## 2018-11-28 DIAGNOSIS — N032 Chronic nephritic syndrome with diffuse membranous glomerulonephritis: Secondary | ICD-10-CM | POA: Diagnosis not present

## 2018-11-28 DIAGNOSIS — N186 End stage renal disease: Secondary | ICD-10-CM | POA: Diagnosis not present

## 2018-11-28 DIAGNOSIS — N2581 Secondary hyperparathyroidism of renal origin: Secondary | ICD-10-CM | POA: Diagnosis not present

## 2018-11-28 DIAGNOSIS — Z992 Dependence on renal dialysis: Secondary | ICD-10-CM | POA: Diagnosis not present

## 2018-11-28 DIAGNOSIS — D509 Iron deficiency anemia, unspecified: Secondary | ICD-10-CM | POA: Diagnosis not present

## 2018-11-28 DIAGNOSIS — E1129 Type 2 diabetes mellitus with other diabetic kidney complication: Secondary | ICD-10-CM | POA: Diagnosis not present

## 2018-11-28 DIAGNOSIS — Z23 Encounter for immunization: Secondary | ICD-10-CM | POA: Diagnosis not present

## 2018-12-01 DIAGNOSIS — N2581 Secondary hyperparathyroidism of renal origin: Secondary | ICD-10-CM | POA: Diagnosis not present

## 2018-12-01 DIAGNOSIS — N186 End stage renal disease: Secondary | ICD-10-CM | POA: Diagnosis not present

## 2018-12-01 DIAGNOSIS — E1129 Type 2 diabetes mellitus with other diabetic kidney complication: Secondary | ICD-10-CM | POA: Diagnosis not present

## 2018-12-01 DIAGNOSIS — Z23 Encounter for immunization: Secondary | ICD-10-CM | POA: Diagnosis not present

## 2018-12-01 DIAGNOSIS — D509 Iron deficiency anemia, unspecified: Secondary | ICD-10-CM | POA: Diagnosis not present

## 2018-12-03 DIAGNOSIS — E1129 Type 2 diabetes mellitus with other diabetic kidney complication: Secondary | ICD-10-CM | POA: Diagnosis not present

## 2018-12-03 DIAGNOSIS — D509 Iron deficiency anemia, unspecified: Secondary | ICD-10-CM | POA: Diagnosis not present

## 2018-12-03 DIAGNOSIS — Z23 Encounter for immunization: Secondary | ICD-10-CM | POA: Diagnosis not present

## 2018-12-03 DIAGNOSIS — N2581 Secondary hyperparathyroidism of renal origin: Secondary | ICD-10-CM | POA: Diagnosis not present

## 2018-12-03 DIAGNOSIS — N186 End stage renal disease: Secondary | ICD-10-CM | POA: Diagnosis not present

## 2018-12-04 ENCOUNTER — Telehealth: Payer: Self-pay | Admitting: Internal Medicine

## 2018-12-04 NOTE — Telephone Encounter (Signed)
Ok to move to Monday when caregivers are available.  James Nicholson

## 2018-12-04 NOTE — Telephone Encounter (Signed)
Called and spoke with James Nicholson [facility].  She is needing a verbal order to move [facility] d/c from Friday to Monday. It was scheduled for today, but caregivers are unavailable today. Dr [name] is not in the office today, 12/04/18.  Message routed to Wyn Quaker, NP, to advise on verbal order

## 2018-12-04 NOTE — Telephone Encounter (Signed)
Called [name] [facility]. Verbal order from Wyn Quaker, NP, given.  Understanding stated.  Nothing further at this time.  Per Wyn Quaker, NP  Ok to move to Monday when caregivers are available.  James Nicholson

## 2018-12-05 DIAGNOSIS — Z23 Encounter for immunization: Secondary | ICD-10-CM | POA: Diagnosis not present

## 2018-12-05 DIAGNOSIS — N2581 Secondary hyperparathyroidism of renal origin: Secondary | ICD-10-CM | POA: Diagnosis not present

## 2018-12-05 DIAGNOSIS — N186 End stage renal disease: Secondary | ICD-10-CM | POA: Diagnosis not present

## 2018-12-05 DIAGNOSIS — E1129 Type 2 diabetes mellitus with other diabetic kidney complication: Secondary | ICD-10-CM | POA: Diagnosis not present

## 2018-12-05 DIAGNOSIS — D509 Iron deficiency anemia, unspecified: Secondary | ICD-10-CM | POA: Diagnosis not present

## 2018-12-07 ENCOUNTER — Encounter: Payer: Self-pay | Admitting: Cardiology

## 2018-12-07 DIAGNOSIS — M3131 Wegener's granulomatosis with renal involvement: Secondary | ICD-10-CM | POA: Diagnosis not present

## 2018-12-07 DIAGNOSIS — D631 Anemia in chronic kidney disease: Secondary | ICD-10-CM | POA: Diagnosis not present

## 2018-12-07 DIAGNOSIS — I120 Hypertensive chronic kidney disease with stage 5 chronic kidney disease or end stage renal disease: Secondary | ICD-10-CM | POA: Diagnosis not present

## 2018-12-07 DIAGNOSIS — E1122 Type 2 diabetes mellitus with diabetic chronic kidney disease: Secondary | ICD-10-CM | POA: Diagnosis not present

## 2018-12-07 DIAGNOSIS — E1140 Type 2 diabetes mellitus with diabetic neuropathy, unspecified: Secondary | ICD-10-CM | POA: Diagnosis not present

## 2018-12-07 DIAGNOSIS — N186 End stage renal disease: Secondary | ICD-10-CM | POA: Diagnosis not present

## 2018-12-08 DIAGNOSIS — D509 Iron deficiency anemia, unspecified: Secondary | ICD-10-CM | POA: Diagnosis not present

## 2018-12-08 DIAGNOSIS — N186 End stage renal disease: Secondary | ICD-10-CM | POA: Diagnosis not present

## 2018-12-08 DIAGNOSIS — N2581 Secondary hyperparathyroidism of renal origin: Secondary | ICD-10-CM | POA: Diagnosis not present

## 2018-12-08 DIAGNOSIS — E1129 Type 2 diabetes mellitus with other diabetic kidney complication: Secondary | ICD-10-CM | POA: Diagnosis not present

## 2018-12-08 DIAGNOSIS — Z23 Encounter for immunization: Secondary | ICD-10-CM | POA: Diagnosis not present

## 2018-12-10 DIAGNOSIS — N186 End stage renal disease: Secondary | ICD-10-CM | POA: Diagnosis not present

## 2018-12-10 DIAGNOSIS — N2581 Secondary hyperparathyroidism of renal origin: Secondary | ICD-10-CM | POA: Diagnosis not present

## 2018-12-10 DIAGNOSIS — E1129 Type 2 diabetes mellitus with other diabetic kidney complication: Secondary | ICD-10-CM | POA: Diagnosis not present

## 2018-12-10 DIAGNOSIS — Z23 Encounter for immunization: Secondary | ICD-10-CM | POA: Diagnosis not present

## 2018-12-10 DIAGNOSIS — D509 Iron deficiency anemia, unspecified: Secondary | ICD-10-CM | POA: Diagnosis not present

## 2018-12-11 DIAGNOSIS — Z125 Encounter for screening for malignant neoplasm of prostate: Secondary | ICD-10-CM | POA: Diagnosis not present

## 2018-12-11 DIAGNOSIS — E785 Hyperlipidemia, unspecified: Secondary | ICD-10-CM | POA: Diagnosis not present

## 2018-12-11 DIAGNOSIS — I10 Essential (primary) hypertension: Secondary | ICD-10-CM | POA: Diagnosis not present

## 2018-12-11 DIAGNOSIS — N186 End stage renal disease: Secondary | ICD-10-CM | POA: Diagnosis not present

## 2018-12-11 DIAGNOSIS — F209 Schizophrenia, unspecified: Secondary | ICD-10-CM | POA: Diagnosis not present

## 2018-12-11 DIAGNOSIS — E1140 Type 2 diabetes mellitus with diabetic neuropathy, unspecified: Secondary | ICD-10-CM | POA: Diagnosis not present

## 2018-12-11 DIAGNOSIS — K219 Gastro-esophageal reflux disease without esophagitis: Secondary | ICD-10-CM | POA: Diagnosis not present

## 2018-12-11 DIAGNOSIS — E1122 Type 2 diabetes mellitus with diabetic chronic kidney disease: Secondary | ICD-10-CM | POA: Diagnosis not present

## 2018-12-12 DIAGNOSIS — D509 Iron deficiency anemia, unspecified: Secondary | ICD-10-CM | POA: Diagnosis not present

## 2018-12-12 DIAGNOSIS — E1129 Type 2 diabetes mellitus with other diabetic kidney complication: Secondary | ICD-10-CM | POA: Diagnosis not present

## 2018-12-12 DIAGNOSIS — Z23 Encounter for immunization: Secondary | ICD-10-CM | POA: Diagnosis not present

## 2018-12-12 DIAGNOSIS — N186 End stage renal disease: Secondary | ICD-10-CM | POA: Diagnosis not present

## 2018-12-12 DIAGNOSIS — N2581 Secondary hyperparathyroidism of renal origin: Secondary | ICD-10-CM | POA: Diagnosis not present

## 2018-12-15 ENCOUNTER — Inpatient Hospital Stay (HOSPITAL_COMMUNITY)
Admission: AD | Admit: 2018-12-15 | Discharge: 2018-12-19 | DRG: 542 | Disposition: A | Discharge status: Home / Self Care | Payer: Medicare Other | Source: Other Acute Inpatient Hospital | Attending: Internal Medicine | Admitting: Internal Medicine

## 2018-12-15 ENCOUNTER — Observation Stay
Admission: EM | Admit: 2018-12-15 | Discharge: 2018-12-15 | Disposition: A | Discharge status: Other Acute Inpatient Hospital | Payer: Medicare Other | Attending: Internal Medicine | Admitting: Internal Medicine

## 2018-12-15 ENCOUNTER — Encounter: Payer: Self-pay | Admitting: Intensive Care

## 2018-12-15 ENCOUNTER — Other Ambulatory Visit: Payer: Self-pay

## 2018-12-15 ENCOUNTER — Emergency Department: Payer: Medicare Other

## 2018-12-15 DIAGNOSIS — M3130 Wegener's granulomatosis without renal involvement: Principal | ICD-10-CM

## 2018-12-15 DIAGNOSIS — E8889 Other specified metabolic disorders: Secondary | ICD-10-CM | POA: Diagnosis not present

## 2018-12-15 DIAGNOSIS — H05119 Granuloma of unspecified orbit: Secondary | ICD-10-CM | POA: Diagnosis not present

## 2018-12-15 DIAGNOSIS — Z992 Dependence on renal dialysis: Secondary | ICD-10-CM

## 2018-12-15 DIAGNOSIS — T380X5A Adverse effect of glucocorticoids and synthetic analogues, initial encounter: Secondary | ICD-10-CM | POA: Diagnosis present

## 2018-12-15 DIAGNOSIS — H0520 Unspecified exophthalmos: Secondary | ICD-10-CM | POA: Diagnosis present

## 2018-12-15 DIAGNOSIS — N186 End stage renal disease: Secondary | ICD-10-CM

## 2018-12-15 DIAGNOSIS — H05111 Granuloma of right orbit: Secondary | ICD-10-CM | POA: Diagnosis not present

## 2018-12-15 DIAGNOSIS — Z95810 Presence of automatic (implantable) cardiac defibrillator: Secondary | ICD-10-CM | POA: Diagnosis not present

## 2018-12-15 DIAGNOSIS — I2510 Atherosclerotic heart disease of native coronary artery without angina pectoris: Secondary | ICD-10-CM | POA: Diagnosis not present

## 2018-12-15 DIAGNOSIS — I4891 Unspecified atrial fibrillation: Secondary | ICD-10-CM | POA: Insufficient documentation

## 2018-12-15 DIAGNOSIS — D631 Anemia in chronic kidney disease: Secondary | ICD-10-CM | POA: Insufficient documentation

## 2018-12-15 DIAGNOSIS — I5042 Chronic combined systolic (congestive) and diastolic (congestive) heart failure: Secondary | ICD-10-CM | POA: Diagnosis not present

## 2018-12-15 DIAGNOSIS — I132 Hypertensive heart and chronic kidney disease with heart failure and with stage 5 chronic kidney disease, or end stage renal disease: Secondary | ICD-10-CM | POA: Diagnosis present

## 2018-12-15 DIAGNOSIS — H5710 Ocular pain, unspecified eye: Secondary | ICD-10-CM | POA: Diagnosis present

## 2018-12-15 DIAGNOSIS — M3131 Wegener's granulomatosis with renal involvement: Secondary | ICD-10-CM

## 2018-12-15 DIAGNOSIS — E1165 Type 2 diabetes mellitus with hyperglycemia: Secondary | ICD-10-CM | POA: Diagnosis not present

## 2018-12-15 DIAGNOSIS — E785 Hyperlipidemia, unspecified: Secondary | ICD-10-CM | POA: Insufficient documentation

## 2018-12-15 DIAGNOSIS — Z79899 Other long term (current) drug therapy: Secondary | ICD-10-CM | POA: Insufficient documentation

## 2018-12-15 DIAGNOSIS — R609 Edema, unspecified: Secondary | ICD-10-CM | POA: Diagnosis not present

## 2018-12-15 DIAGNOSIS — E1129 Type 2 diabetes mellitus with other diabetic kidney complication: Secondary | ICD-10-CM | POA: Diagnosis present

## 2018-12-15 DIAGNOSIS — R0902 Hypoxemia: Secondary | ICD-10-CM | POA: Diagnosis not present

## 2018-12-15 DIAGNOSIS — I428 Other cardiomyopathies: Secondary | ICD-10-CM | POA: Diagnosis present

## 2018-12-15 DIAGNOSIS — K219 Gastro-esophageal reflux disease without esophagitis: Secondary | ICD-10-CM

## 2018-12-15 DIAGNOSIS — Z87891 Personal history of nicotine dependence: Secondary | ICD-10-CM

## 2018-12-15 DIAGNOSIS — H547 Unspecified visual loss: Secondary | ICD-10-CM | POA: Diagnosis not present

## 2018-12-15 DIAGNOSIS — I10 Essential (primary) hypertension: Secondary | ICD-10-CM

## 2018-12-15 DIAGNOSIS — M300 Polyarteritis nodosa: Secondary | ICD-10-CM | POA: Diagnosis not present

## 2018-12-15 DIAGNOSIS — Z794 Long term (current) use of insulin: Secondary | ICD-10-CM

## 2018-12-15 DIAGNOSIS — I120 Hypertensive chronic kidney disease with stage 5 chronic kidney disease or end stage renal disease: Secondary | ICD-10-CM | POA: Diagnosis not present

## 2018-12-15 DIAGNOSIS — F209 Schizophrenia, unspecified: Secondary | ICD-10-CM | POA: Insufficient documentation

## 2018-12-15 DIAGNOSIS — H5789 Other specified disorders of eye and adnexa: Secondary | ICD-10-CM | POA: Diagnosis not present

## 2018-12-15 DIAGNOSIS — N189 Chronic kidney disease, unspecified: Secondary | ICD-10-CM

## 2018-12-15 DIAGNOSIS — E1122 Type 2 diabetes mellitus with diabetic chronic kidney disease: Secondary | ICD-10-CM | POA: Diagnosis not present

## 2018-12-15 DIAGNOSIS — Z9114 Patient's other noncompliance with medication regimen: Secondary | ICD-10-CM | POA: Diagnosis not present

## 2018-12-15 DIAGNOSIS — Z8249 Family history of ischemic heart disease and other diseases of the circulatory system: Secondary | ICD-10-CM

## 2018-12-15 DIAGNOSIS — D696 Thrombocytopenia, unspecified: Secondary | ICD-10-CM | POA: Diagnosis present

## 2018-12-15 DIAGNOSIS — N2581 Secondary hyperparathyroidism of renal origin: Secondary | ICD-10-CM | POA: Diagnosis present

## 2018-12-15 DIAGNOSIS — H5711 Ocular pain, right eye: Secondary | ICD-10-CM

## 2018-12-15 DIAGNOSIS — I776 Arteritis, unspecified: Secondary | ICD-10-CM | POA: Diagnosis not present

## 2018-12-15 DIAGNOSIS — Z8674 Personal history of sudden cardiac arrest: Secondary | ICD-10-CM

## 2018-12-15 HISTORY — DX: Wegener's granulomatosis without renal involvement: M31.30

## 2018-12-15 LAB — CBC WITH DIFFERENTIAL/PLATELET
Abs Immature Granulocytes: 0.21 10*3/uL — ABNORMAL HIGH (ref 0.00–0.07)
Basophils Absolute: 0 10*3/uL (ref 0.0–0.1)
Basophils Relative: 0 %
Eosinophils Absolute: 0.1 10*3/uL (ref 0.0–0.5)
Eosinophils Relative: 1 %
HCT: 36 % — ABNORMAL LOW (ref 39.0–52.0)
Hemoglobin: 11.6 g/dL — ABNORMAL LOW (ref 13.0–17.0)
Immature Granulocytes: 2 %
Lymphocytes Relative: 19 %
Lymphs Abs: 1.7 10*3/uL (ref 0.7–4.0)
MCH: 31.8 pg (ref 26.0–34.0)
MCHC: 32.2 g/dL (ref 30.0–36.0)
MCV: 98.6 fL (ref 80.0–100.0)
Monocytes Absolute: 0.7 10*3/uL (ref 0.1–1.0)
Monocytes Relative: 7 %
Neutro Abs: 6.3 10*3/uL (ref 1.7–7.7)
Neutrophils Relative %: 71 %
Platelets: 132 10*3/uL — ABNORMAL LOW (ref 150–400)
RBC: 3.65 MIL/uL — ABNORMAL LOW (ref 4.22–5.81)
RDW: 15.9 % — ABNORMAL HIGH (ref 11.5–15.5)
WBC: 8.9 10*3/uL (ref 4.0–10.5)
nRBC: 0 % (ref 0.0–0.2)

## 2018-12-15 LAB — HEPATIC FUNCTION PANEL
ALT: 28 U/L (ref 0–44)
AST: 21 U/L (ref 15–41)
Albumin: 3.3 g/dL — ABNORMAL LOW (ref 3.5–5.0)
Alkaline Phosphatase: 34 U/L — ABNORMAL LOW (ref 38–126)
Bilirubin, Direct: <0.1 mg/dL (ref 0.0–0.2)
Total Bilirubin: 0.7 mg/dL (ref 0.3–1.2)
Total Protein: 6 g/dL — ABNORMAL LOW (ref 6.5–8.1)

## 2018-12-15 LAB — BASIC METABOLIC PANEL
Anion gap: 8 (ref 5–15)
BUN: 52 mg/dL — ABNORMAL HIGH (ref 6–20)
CO2: 28 mmol/L (ref 22–32)
Calcium: 10.3 mg/dL (ref 8.9–10.3)
Chloride: 105 mmol/L (ref 98–111)
Creatinine, Ser: 10.03 mg/dL — ABNORMAL HIGH (ref 0.61–1.24)
GFR calc Af Amer: 6 mL/min — ABNORMAL LOW (ref >60)
GFR calc non Af Amer: 5 mL/min — ABNORMAL LOW (ref >60)
Glucose, Bld: 86 mg/dL (ref 70–99)
Potassium: 4.1 mmol/L (ref 3.5–5.1)
Sodium: 141 mmol/L (ref 135–145)

## 2018-12-15 LAB — SEDIMENTATION RATE: Sed Rate: 10 mm/hr (ref 0–16)

## 2018-12-15 LAB — GLUCOSE, CAPILLARY: Glucose-Capillary: 103 mg/dL — ABNORMAL HIGH (ref 70–99)

## 2018-12-15 MED ORDER — INSULIN ASPART 100 UNIT/ML ~~LOC~~ SOLN: 0.0000 [IU] | Freq: Every day | SUBCUTANEOUS | Status: DC

## 2018-12-15 MED ORDER — INSULIN ASPART 100 UNIT/ML ~~LOC~~ SOLN
0.0000 [IU] | Freq: Three times a day (TID) | SUBCUTANEOUS | Status: DC
  Administered 2018-12-16: 1 [IU] via SUBCUTANEOUS
  Administered 2018-12-16: 2 [IU] via SUBCUTANEOUS
  Administered 2018-12-17: 1 [IU] via SUBCUTANEOUS
  Administered 2018-12-18 (×2): 2 [IU] via SUBCUTANEOUS

## 2018-12-15 MED ORDER — HYDRALAZINE HCL 20 MG/ML IJ SOLN: 5.0000 mg | INTRAMUSCULAR | Status: DC | PRN

## 2018-12-15 MED ORDER — HEPARIN SODIUM (PORCINE) 5000 UNIT/ML IJ SOLN
5000.0000 [IU] | Freq: Three times a day (TID) | INTRAMUSCULAR | Status: DC
  Administered 2018-12-15 – 2018-12-19 (×12): 5000 [IU] via SUBCUTANEOUS
  Filled 2018-12-15 (×10): qty 1

## 2018-12-15 MED ORDER — ACETAMINOPHEN 650 MG RE SUPP: 650.0000 mg | Freq: Four times a day (QID) | RECTAL | Status: DC | PRN

## 2018-12-15 MED ORDER — ACETAMINOPHEN 325 MG PO TABS
650.0000 mg | ORAL_TABLET | Freq: Four times a day (QID) | ORAL | Status: DC | PRN
  Administered 2018-12-16: 650 mg via ORAL
  Filled 2018-12-15: qty 2

## 2018-12-15 MED ORDER — ONDANSETRON HCL 4 MG/2ML IJ SOLN: 4.0000 mg | Freq: Four times a day (QID) | INTRAMUSCULAR | Status: DC | PRN

## 2018-12-15 MED ORDER — ONDANSETRON HCL 4 MG PO TABS: 4.0000 mg | ORAL_TABLET | Freq: Four times a day (QID) | ORAL | Status: DC | PRN

## 2018-12-15 MED ORDER — SENNOSIDES-DOCUSATE SODIUM 8.6-50 MG PO TABS: 1.0000 | ORAL_TABLET | Freq: Every evening | ORAL | Status: DC | PRN

## 2018-12-15 NOTE — ED Notes (Signed)
Patient transported to CT 

## 2018-12-15 NOTE — ED Triage Notes (Addendum)
Patient presents with right eye swelling, redness, and drainage. HX known eye disorder Wegener's Granulomatosis. Patient speaks croatian. Interpretor on stick used to triage and communicate with MD. Dialysis patient Tuesday, Thursday, Saturday. No problems with dialysis treatments at this time. Dialysis access on Right arm.

## 2018-12-15 NOTE — ED Provider Notes (Signed)
[facility] Emergency Department Provider Note  ____________________________________________  Time seen: Approximately 12:09 PM  I have reviewed the triage vital signs and the nursing notes.   HISTORY  Chief Complaint Facial Swelling (right eye)  Encounter completed with [place] interpreter  HPI James Nicholson is a 53 y.o. male with a history of hypertension diabetes Wegener's granulomatosis, end-stage renal disease on hemodialysis  who comes the ED complaining of worsening eye swelling, eye pain, and decreased vision in the right eye over the last 2 days, gradual onset.  He reports this is similar to what happened to him in December.  Been taking his medications as prescribed, but review of the [facility] that was sent from his group home suggest that his prednisone taper was discontinued.  No further history is available from the group home.  Patient denies fever chills chest pain or shortness of breath.  No trauma.  Gets his dialysis Saturday Tuesday Thursday, last dialysis was 3 days ago.     Past Medical History:  Diagnosis Date  . Anemia   . Atrial fibrillation   . Cardiomyopathy ([place]) 2010   Unclear Etiology: Last Echo 06/2009: EF 40-45%, severe Lateral & apical Hypokinesis (? CAD) ; Grade 2 DDysfxn. Mild conc LVH.   Marland Kitchen Cellulitis   . Chronic kidney disease (CKD), stage V ([place])    Dialysis Tue, Thurs, Sat  . Dyslipidemia   . Enterobacter sepsis ([place])   . Hypertension   . IDDM (insulin dependent diabetes mellitus) ([place]) 11/03/2014  . S/P ICD (internal cardiac defibrillator) procedure 2010   VT Arrest (in [place])  . Schizophrenia ([place])   . 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 Wegener's granulomatosis [facility])      Patient Active Problem List   Diagnosis Date Noted  . Orbital mass 10/08/2018  . Sinusitis 10/08/2018  . Redness of right eye 10/08/2018  . Ocular proptosis 10/08/2018  . Pulmonary nodule, left 10/08/2018  . Cough   . Lung  nodules--left upper lobe and left posterior lobe- need to rule out malignancy 10/05/2018  . Conjunctivitis 09/27/2018  . Chronic kidney disease   . Hyperlipidemia   . Sepsis ([place]) 11/03/2014  . IDDM (insulin dependent diabetes mellitus) ([place]) 11/03/2014  . Troponin level elevated 12/22/2013    Class: Acute  . Mechanical complication of other vascular device, implant, and graft 08/04/2013  . Other complications due to renal dialysis device, implant, and graft 08/04/2013  . ESRD (end stage renal disease) ([place]) 04/13/2013  . Anemia in chronic kidney disease 02/05/2013  . Pneumonia 02/05/2013  . GI bleed 02/05/2013  . Metabolic acidosis 48/54/6270  . 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 PPD positive - completed 9 months INH (per Dr. Linus Salmons) 05/25/2012  . Other primary cardiomyopathies 05/07/2012  . H/o Ventricular fibrillation Arrest now with AICD 05/07/2012    Class: History of  . Hypertension 05/07/2012  . Automatic implantable cardioverter-defibrillator in situ 05/07/2012     Past Surgical History:  Procedure Laterality Date  . AV FISTULA PLACEMENT Right 02/10/2013   Procedure: ARTERIOVENOUS (AV) FISTULA CREATION;  Surgeon: Rosetta Posner, MD;  Location: [facility] OR;  Service: Vascular;  Laterality: Right;  Right forearm radial/cephalic arterovenous fistula.   Marland Kitchen CARDIAC CATHETERIZATION  2010   [place]: In setting of VT arrest. Per brother's report, nonobstructive with no intervention  . CARDIAC DEFIBRILLATOR PLACEMENT  2010   [place]  . FISTULOGRAM Right 08/09/2013   Procedure: FISTULOGRAM;  Surgeon: Angelia Mould, MD;  Location: [facility] CATH  LAB;  Service: Cardiovascular;  Laterality: Right;  . ICD GENERATOR CHANGEOUT N/A 12/05/2017   Procedure: ICD GENERATOR CHANGEOUT;  Surgeon: Evans Lance, MD;  Location: [place] CV LAB;  Service: Cardiovascular;  Laterality: N/A;  . LIGATION OF COMPETING BRANCHES OF ARTERIOVENOUS FISTULA Right 08/13/2013   Procedure: LIGATION OF  COMPETING BRANCHES X5 OF ARTERIOVENOUS FISTULA- RIGHT ARM;  Surgeon: Serafina Mitchell, MD;  Location: [place] OR;  Service: Vascular;  Laterality: Right;     Prior to Admission medications   Medication Sig Start Date End Date Taking? Authorizing Provider  Amino Acids-Protein Hydrolys (FEEDING SUPPLEMENT, PRO-STAT SUGAR FREE 64,) LIQD Take 30 mLs by mouth 2 (two) times daily.    [provider]  calcitRIOL (ROCALTROL) 0.5 MCG capsule Take 2 capsules (1 mcg total) by mouth every Monday, Wednesday, and Friday with hemodialysis. 11/09/18   Medina-Vargas, Monina C, NP  calcium acetate (PHOSLO) 667 MG capsule Take 2 capsules (1,334 mg total) by mouth 3 (three) times daily with meals. 8a, 12p, 5p 11/09/18   Medina-Vargas, Monina C, NP  gabapentin (NEURONTIN) 300 MG capsule Take 1 capsule (300 mg total) by mouth 2 (two) times daily. 11/09/18   Medina-Vargas, Monina C, NP  insulin aspart (NOVOLOG FLEXPEN) 100 UNIT/ML FlexPen Inject 0-5 Units into the skin 3 (three) times daily with meals. 201-250 = 2 units, 251-300 = 3 units, 301-350 = 4 units, 351-400 = 5 units 11/09/18   Medina-Vargas, Monina C, NP  Insulin Pen Needle 32G X 5 MM MISC 1 each by Does not apply route daily as needed. 11/09/18   Medina-Vargas, Monina C, NP  ipratropium-albuterol (DUONEB) 0.5-2.5 (3) MG/3ML SOLN Take 3 mLs by nebulization every 4 (four) hours as needed for up to 30 days. 11/09/18 12/09/18  Medina-Vargas, Monina C, NP  omeprazole (PRILOSEC) 20 MG capsule Take 20 mg by mouth every morning.     [provider]  risperiDONE (RISPERDAL) 0.25 MG tablet Take 1 tablet (0.25 mg total) by mouth at bedtime. 11/09/18   Medina-Vargas, Monina C, NP  traZODone (DESYREL) 100 MG tablet Take 1 tablet (100 mg total) by mouth at bedtime. 11/09/18   Medina-Vargas, Monina C, NP     Allergies Patient has no known allergies.   Family History  Problem Relation Age of Onset  . CAD Mother     Social History Social History   Tobacco Use   . Smoking status: Former Smoker    Last attempt to quit: 10/28/2008    Years since quitting: 10.1  . Smokeless tobacco: Never Used  Substance Use Topics  . Alcohol use: No  . Drug use: No    Review of Systems  Constitutional:   No fever or chills.  ENT:   No sore throat. No rhinorrhea. Cardiovascular:   No chest pain or syncope. Respiratory:   No dyspnea or cough. Gastrointestinal:   Negative for abdominal pain, vomiting and diarrhea.  Musculoskeletal:   Negative for focal pain or swelling All other systems reviewed and are negative except as documented above in ROS and HPI.  ____________________________________________   PHYSICAL EXAM:  VITAL SIGNS: ED Triage Vitals [12/15/18 0827]  Enc Vitals Group     BP 138/81     Pulse Rate 88     Resp 14     Temp 98.2 F (36.8 C)     Temp Source Oral     SpO2 96 %     Weight 160 lb (72.6 kg)     Height 5\' 6"  (  1.676 m)     Head Circumference      Peak Flow      Pain Score 5     Pain Loc      Pain Edu?      Excl. in [place]?     Vital signs reviewed, nursing assessments reviewed.   Constitutional:   Alert and oriented. Non-toxic appearance. Eyes:   Right bulbar conjunctive with significant watery edema and injection.  Right eye is proptotic.Marland Kitchen PERRL.  No APD.  There is limitation of extraocular movements of the right eye particularly laterally and up and down.  Funduscopy shows sharp optic disc, no pallor or hemorrhage. ENT      Head:   Normocephalic and atraumatic.      Nose:   No congestion/rhinnorhea.       Mouth/Throat:   MMM, no pharyngeal erythema. No peritonsillar mass.       Neck:   No meningismus. Full ROM. Hematological/Lymphatic/Immunilogical:   No cervical lymphadenopathy. Cardiovascular:   RRR. Symmetric bilateral radial and DP pulses.  No murmurs. Cap refill less than 2 seconds.  Right forearm AV fistula good thrill Respiratory:   Normal respiratory effort without tachypnea/retractions. Breath sounds are clear and  equal bilaterally. No wheezes/rales/rhonchi. Gastrointestinal:   Soft and nontender. Non distended. There is no CVA tenderness.  No rebound, rigidity, or guarding. Musculoskeletal:   Normal range of motion in all extremities. No joint effusions.  No lower extremity tenderness.  No edema. Neurologic:   Normal speech and language.  Motor grossly intact. No acute focal neurologic deficits are appreciated.  Skin:    Skin is warm, dry and intact. No rash noted.  No petechiae, purpura, or bullae.  ____________________________________________    LABS (pertinent positives/negatives) (all labs ordered are listed, but only abnormal results are displayed) Labs Reviewed  BASIC METABOLIC PANEL - Abnormal; Notable for the following components:      Result Value   BUN 52 (*)    Creatinine, Ser 10.03 (*)    GFR calc non Af Amer 5 (*)    GFR calc Af Amer 6 (*)    All other components within normal limits  HEPATIC FUNCTION PANEL - Abnormal; Notable for the following components:   Total Protein 6.0 (*)    Albumin 3.3 (*)    Alkaline Phosphatase 34 (*)    All other components within normal limits  CBC WITH DIFFERENTIAL/PLATELET - Abnormal; Notable for the following components:   RBC 3.65 (*)    Hemoglobin 11.6 (*)    HCT 36.0 (*)    RDW 15.9 (*)    Platelets 132 (*)    Abs Immature Granulocytes 0.21 (*)    All other components within normal limits   ____________________________________________   EKG    ____________________________________________    RADIOLOGY  Ct Orbits Wo Contrast  Result Date: 12/15/2018 CLINICAL DATA:  History of Wegner's granulomatosis. Recurrent right eye swelling and proptosis. EXAM: CT ORBITS WITHOUT CONTRAST TECHNIQUE: Multidetector CT images were obtained using the standard protocol without intravenous contrast. COMPARISON:  10/08/2018 FINDINGS: Orbits: Regression of ill-defined lobulated mass in the intra and extraconal right orbit, currently 25 x 22 mm on  axial slices as compared to 35 x 26 mm. There is still proptosis on the right. Stable bony dehiscence in the medial and anterior right orbital wall. Visualized sinuses: Mild mucosal thickening, greatest in the frontal sinuses, that has improved since prior. Chronic defect in the right anterior uncinate process and right middle turbinate. Resolved left  mastoid and middle ear opacification. Soft tissues: No evidence of acute inflammation. Limited intracranial: Negative IMPRESSION: 1. History of Wegner's disease with right orbital granuloma that has regressed since 10/08/2018 comparison. The mass currently measures up to 2.5 cm and continues to cause proptosis. 2. Improved sinusitis and resolved left mastoid opacification. Electronically Signed   By: Monte Fantasia M.D.   On: 12/15/2018 09:07    ____________________________________________   PROCEDURES Procedures  ____________________________________________  DIFFERENTIAL DIAGNOSIS   Orbital granuloma, abscess  CLINICAL IMPRESSION / ASSESSMENT AND PLAN / ED COURSE  Pertinent labs & imaging results that were available during my care of the patient were reviewed by me and considered in my medical decision making (see chart for details).   Patient with recently diagnosed Wegener's granulomatosis with orbital granuloma presents with worsening symptoms of the right eye including vision change.  Exam does not reveal any obvious concerns, but with worsening symptoms will check labs and orbital CT.  ----------------------------------------- 12:23 PM on 12/15/2018 -----------------------------------------  Work-up so far reassuring although granuloma is near similar size to what it was at diagnosis, concerning for relative enlargement compared to perhaps when it had recently improved to.  Discussed with nephrology who recommends transfer back to [facility] if possible for continuity of care given his complicated course and multiple specialists  involved.   Clinical Course as of Dec 15 1408  Tue Dec 15, 2018  1256 Discussed with nephrology Dr. Candiss Norse who recommends transfer back to [facility] due to complicated nature and multiple specialists involved.  Discussed with Dr. Jamse Arn of [facility] hospitalist who recommends first consulting with [facility] ophthalmology for their recommendations.   [PS]  1344 Despite multiple attempts, unable to get the [facility] to answer the phone. [facility] also unable to assist with contact.  Will reconsult [facility] hospitalist to avoid further delay in care   [PS]  1354 Received callback from [place], Dr. [name] (ophtho) assistant. She will d/w Dr. [name].   [PS]    Clinical Course User Index [PS] Carrie Mew, MD    ----------------------------------------- 2:10 PM on 12/15/2018 -----------------------------------------  Dr. [name] recommends transfer to [facility] where he will then evaluate the patient.  Discussed with Dr. Jamse Arn again who accepts for transfer.  [facility] to facilitate.   ____________________________________________   FINAL CLINICAL IMPRESSION(S) / ED DIAGNOSES    Final diagnoses:  Granulomatosis with polyangiitis with renal involvement ([place])  ESRD on hemodialysis ([place])  Orbital granuloma, right     ED Discharge Orders    None      Portions of this note were generated with dragon dictation software. Dictation errors may occur despite best attempts at proofreading.   Carrie Mew, MD 12/15/18 [phone]

## 2018-12-15 NOTE — H&P (Signed)
History and Physical    James Nicholson PZW:[id number] DOB: 12-20-1965 DOA: 12/15/2018  Referring MD/NP/PA:   PCP: [name], MD   Patient coming from:  The patient is coming from group home.  At baseline, pt is partially dependent for most of ADL.        Chief Complaint: right eye pain and swelling  HPI: James Nicholson is a 53 y.o. male with medical history significant of hypertension, hyperlipidemia, diabetes mellitus, ventricular fibrillation arrest, ICD placement, ESRD-HD (TTS), Wegener granulomatosis of lung and eye, schizophrenia, CHF with EF of 45%, GERD, who presents with right eye pain.  Pt is transfered from [facility].  Patient was recently hospitalized to [facility] hospital from 12/8-12/20/2019 due to [facility] granulomatosis exacerbation, involved lung and right eye. Pt was treated with steroids and Rituxan. Pt was discharged on long term of prednisone tapering.  It seems that group home discontinued his prednisone taper without completing it. He presented to [facility] due to right eye pain and swelling.  Patient states that his right eye pain and swelling have been going on for more than 2 days, with mild vision decrease.  No fever or chills.  Patient does not have chest pain, cough, shortness breath.  Patient denies nausea, vomiting, diarrhea, abdominal pain, symptoms of UTI or unilateral weakness.  Patient did not do dialysis today (Tuesday).  Pt was initially evaluated in [facility]. Patient was discussed with ophthalmology here at [facility], who recommended no specific interventions at present, but did recommend admission to [facility], so he could be evaluated by them.  Patient was found to have WBC 8.9, potassium 4.1, bicarbonate 28, creatinine 10.03, BUN 52, temperature normal, heart rate in 90s, oxygen saturation 93-100% on room air.  Patient is placed on telemetry bed for observation.  CT-orbit showed  1. History of Wegner's disease with  right orbital granuloma that has regressed since 10/08/2018 comparison. The mass currently measures up to 2.5 cm and continues to cause proptosis. 2. Improved sinusitis and resolved left mastoid opacification.  Review of Systems:   General: no fevers, chills, no body weight gain, fatigue HEENT: no hearing changes or sore throat. Has right eye pain and swelling Respiratory: no dyspnea, coughing, wheezing CV: no chest pain, no palpitations GI: no nausea, vomiting, abdominal pain, diarrhea, constipation GU: no dysuria, burning on urination, increased urinary frequency, hematuria  Ext: no leg edema Neuro: no unilateral weakness, numbness, or tingling, no vision change or hearing loss Skin: no rash, no skin tear. MSK: No muscle spasm, no deformity, no limitation of range of movement in spin Heme: No easy bruising.  Travel history: No recent long distant travel.  Allergy: No Known Allergies  Past Medical History:  Diagnosis Date  . Anemia   . Atrial fibrillation   . Cardiomyopathy ([place]) 2010   Unclear Etiology: Last Echo 06/2009: EF 40-45%, severe Lateral & apical Hypokinesis (? CAD) ; Grade 2 DDysfxn. Mild conc LVH.   Marland Kitchen Cellulitis   . Chronic kidney disease (CKD), stage V ([place])    Dialysis Tue, Thurs, Sat  . Dyslipidemia   . Enterobacter sepsis ([place])   . Hypertension   . IDDM (insulin dependent diabetes mellitus) ([place]) 11/03/2014  . S/P ICD (internal cardiac defibrillator) procedure 2010   VT Arrest (in [place])  . Schizophrenia ([place])   . Wegener's disease, pulmonary ([place]) 02/05/2013  . Wegener's granulomatosis ([place])     Past Surgical History:  Procedure Laterality Date  . AV FISTULA PLACEMENT Right 02/10/2013  Procedure: ARTERIOVENOUS (AV) FISTULA CREATION;  Surgeon: Rosetta Posner, MD;  Location[facility] OR;  Service: Vascular;  Laterality: Right;  Right forearm radial/cephalic arterovenous fistula.   Marland Kitchen CARDIAC CATHETERIZATION  2010   [place]: In setting of VT arrest. Per brother's  report, nonobstructive with no intervention  . CARDIAC DEFIBRILLATOR PLACEMENT  2010   [place]  . FISTULOGRAM Right 08/09/2013   Procedure: FISTULOGRAM;  Surgeon: Angelia Mould, MD;  Location: [facility] CATH LAB;  Service: Cardiovascular;  Laterality: Right;  . ICD GENERATOR CHANGEOUT N/A 12/05/2017   Procedure: ICD GENERATOR CHANGEOUT;  Surgeon: Evans Lance, MD;  Location: [place] CV LAB;  Service: Cardiovascular;  Laterality: N/A;  . LIGATION OF COMPETING BRANCHES OF ARTERIOVENOUS FISTULA Right 08/13/2013   Procedure: LIGATION OF COMPETING BRANCHES X5 OF ARTERIOVENOUS FISTULA- RIGHT ARM;  Surgeon: Serafina Mitchell, MD;  Location: [place] OR;  Service: Vascular;  Laterality: Right;    Social History:  reports that he quit smoking about 10 years ago. He has never used smokeless tobacco. He reports that he does not drink alcohol or use drugs.  Family History:  Family History  Problem Relation Age of Onset  . CAD Mother      Prior to Admission medications   Medication Sig Start Date End Date Taking? Authorizing Provider  Amino Acids-Protein Hydrolys (FEEDING SUPPLEMENT, PRO-STAT SUGAR FREE 64,) LIQD Take 30 mLs by mouth 2 (two) times daily.    [provider]  calcitRIOL (ROCALTROL) 0.5 MCG capsule Take 2 capsules (1 mcg total) by mouth every Monday, Wednesday, and Friday with hemodialysis. 11/09/18   Medina-Vargas, Monina C, NP  calcium acetate (PHOSLO) 667 MG capsule Take 2 capsules (1,334 mg total) by mouth 3 (three) times daily with meals. 8a, 12p, 5p 11/09/18   Medina-Vargas, Monina C, NP  gabapentin (NEURONTIN) 300 MG capsule Take 1 capsule (300 mg total) by mouth 2 (two) times daily. 11/09/18   Medina-Vargas, Monina C, NP  insulin aspart (NOVOLOG FLEXPEN) 100 UNIT/ML FlexPen Inject 0-5 Units into the skin 3 (three) times daily with meals. 201-250 = 2 units, 251-300 = 3 units, 301-350 = 4 units, 351-400 = 5 units 11/09/18   Medina-Vargas, Monina C, NP  Insulin Pen Needle 32G X 5  MM MISC 1 each by Does not apply route daily as needed. 11/09/18   Medina-Vargas, Monina C, NP  ipratropium-albuterol (DUONEB) 0.5-2.5 (3) MG/3ML SOLN Take 3 mLs by nebulization every 4 (four) hours as needed for up to 30 days. 11/09/18 12/09/18  Medina-Vargas, Monina C, NP  omeprazole (PRILOSEC) 20 MG capsule Take 20 mg by mouth every morning.     [provider]  risperiDONE (RISPERDAL) 0.25 MG tablet Take 1 tablet (0.25 mg total) by mouth at bedtime. 11/09/18   Medina-Vargas, Monina C, NP  traZODone (DESYREL) 100 MG tablet Take 1 tablet (100 mg total) by mouth at bedtime. 11/09/18   Medina-Vargas, Senaida Lange, NP    Physical Exam: Vitals:   12/15/18 1900 12/16/18 0132  Weight: 78.3 kg 77.3 kg  Height: 5\' 8"  (1.727 m)    General: Not in acute distress HEENT:       Eyes: PERRL, EOMI, no scleral icterus. Right eye is puffy and swelling.       ENT: No discharge from the ears and nose, no pharynx injection, no tonsillar enlargement.        Neck: No JVD, no bruit, no mass felt. Heme: No neck lymph node enlargement. Cardiac: S1/S2, RRR, No murmurs, No gallops  or rubs. Respiratory: No rales, wheezing, rhonchi or rubs. GI: Soft, nondistended, nontender, no rebound pain, no organomegaly, BS present. GU: No hematuria Ext: No pitting leg edema bilaterally. 2+DP/PT pulse bilaterally. Has AVF in right arm. Musculoskeletal: No joint deformities, No joint redness or warmth, no limitation of ROM in spin. Skin: No rashes.  Neuro: Alert, oriented X3, cranial nerves II-XII grossly intact, moves all extremities normally.  Psych: Patient is not psychotic, no suicidal or hemocidal ideation.  Labs on Admission: I have personally reviewed following labs and imaging studies  CBC: Recent Labs  Lab 12/15/18 0823 12/16/18 0326  WBC 8.9 9.0  NEUTROABS 6.3  --   HGB 11.6* 10.9*  HCT 36.0* 34.1*  MCV 98.6 98.6  PLT 132* 250*   Basic Metabolic Panel: Recent Labs  Lab 12/15/18 0823 12/16/18 0326    NA 141 140  K 4.1 4.9  CL 105 105  CO2 28 24  GLUCOSE 86 94  BUN 52* 60*  CREATININE 10.03* 11.97*  CALCIUM 10.3 9.6   GFR: Estimated Creatinine Clearance: 7 mL/min (A) (by C-G formula based on SCr of 11.97 mg/dL (H)). Liver Function Tests: Recent Labs  Lab 12/15/18 0823  AST 21  ALT 28  ALKPHOS 34*  BILITOT 0.7  PROT 6.0*  ALBUMIN 3.3*   No results for input(s): LIPASE, AMYLASE in the last 168 hours. No results for input(s): AMMONIA in the last 168 hours. Coagulation Profile: No results for input(s): INR, PROTIME in the last 168 hours. Cardiac Enzymes: No results for input(s): CKTOTAL, CKMB, CKMBINDEX, TROPONINI in the last 168 hours. BNP (last 3 results) No results for input(s): PROBNP in the last 8760 hours. HbA1C: No results for input(s): HGBA1C in the last 72 hours. CBG: Recent Labs  Lab 12/15/18 2148  GLUCAP 103*   Lipid Profile: No results for input(s): CHOL, HDL, LDLCALC, TRIG, CHOLHDL, LDLDIRECT in the last 72 hours. Thyroid Function Tests: No results for input(s): TSH, T4TOTAL, FREET4, T3FREE, THYROIDAB in the last 72 hours. Anemia Panel: No results for input(s): VITAMINB12, FOLATE, FERRITIN, TIBC, IRON, RETICCTPCT in the last 72 hours. Urine analysis:    Component Value Date/Time   COLORURINE RED (A) 07/06/2016 1938   APPEARANCEUR TURBID (A) 07/06/2016 1938   LABSPEC 1.020 07/06/2016 1938   PHURINE 7.5 07/06/2016 1938   GLUCOSEU NEGATIVE 07/06/2016 1938   HGBUR LARGE (A) 07/06/2016 1938   BILIRUBINUR NEGATIVE 07/06/2016 1938   KETONESUR NEGATIVE 07/06/2016 1938   PROTEINUR >300 (A) 07/06/2016 1938   UROBILINOGEN 0.2 11/04/2014 0952   NITRITE NEGATIVE 07/06/2016 1938   LEUKOCYTESUR SMALL (A) 07/06/2016 1938   Sepsis Labs: @LABRCNTIP (procalcitonin:4,lacticidven:4) )No results found for this or any previous visit (from the past 240 hour(s)).   Radiological Exams on Admission: Ct Orbits Wo Contrast  Result Date: 12/15/2018 CLINICAL DATA:   History of Wegner's granulomatosis. Recurrent right eye swelling and proptosis. EXAM: CT ORBITS WITHOUT CONTRAST TECHNIQUE: Multidetector CT images were obtained using the standard protocol without intravenous contrast. COMPARISON:  10/08/2018 FINDINGS: Orbits: Regression of ill-defined lobulated mass in the intra and extraconal right orbit, currently 25 x 22 mm on axial slices as compared to 35 x 26 mm. There is still proptosis on the right. Stable bony dehiscence in the medial and anterior right orbital wall. Visualized sinuses: Mild mucosal thickening, greatest in the frontal sinuses, that has improved since prior. Chronic defect in the right anterior uncinate process and right middle turbinate. Resolved left mastoid and middle ear opacification. Soft tissues: No evidence of  acute inflammation. Limited intracranial: Negative IMPRESSION: 1. History of Wegner's disease with right orbital granuloma that has regressed since 10/08/2018 comparison. The mass currently measures up to 2.5 cm and continues to cause proptosis. 2. Improved sinusitis and resolved left mastoid opacification. Electronically Signed   By: Monte Fantasia M.D.   On: 12/15/2018 09:07     EKG: Independently reviewed.  Sinus rhythm, QTC 440, right bundle blockade, early R wave progression  Assessment/Plan Principal Problem:   Eye pain, right Active Problems:   Hypertension   Automatic implantable cardioverter-defibrillator in situ   Anemia in chronic kidney disease   ESRD on dialysis ([place])   Wegener's granulomatosis ([place])   Type II diabetes mellitus with renal manifestations (HCC)   GERD (gastroesophageal reflux disease)   Right Eye pain: Etiology is not clear.  Possibly due to vascular granulomatosis flareup.  Ophthalmologist, Dr. [name] was consulted, waiting for recommendations now. -will place on tele bed for obs -will give 1 dose of Solu-Medrol 60 mg x 1 while waiting for ophthalmologist recommendation -Start  ciprofloxacin eye ointment empirically  Wegener's granulomatosis: No respiratory symptoms at this time. -See above for my involvement  HTN: not taking meds at home -IV hydralazine prn  Anemia in chronic kidney disease: hgb stable. Hgb 11.6 -f/u by CBC  ESRD on dialysis (TTS): -I consulted Dr. Justin Mend for HD in AM  Type II diabetes mellitus with renal manifestations [facility]): Last A1c 4.8 n 10/04/18, well controled. Patient is taking novolog at home -SSI  GERD (gastroesophageal reflux disease): -protonix   DVT ppx: SQ Heparin  Code Status: Full code Family Communication: None at bed side.  Disposition Plan:  Anticipate discharge back to previous group home environment: Consults called:  Renal, Dr. Justin Mend and ophthalmologist, Dr. [name] Admission status: Obs / tele   Date of Service 12/16/2018    [facility]   If 7PM-7AM, please contact night-coverage [email or web address] Password [email or web address] 12/16/2018, 5:11 AM

## 2018-12-15 NOTE — Progress Notes (Signed)
53 year old man with past medical history significant for end-stage renal disease, schizophrenia who was recently discharged from [facility] after diagnosis of Wegener's granulomatosis involving lung and eye on long prednisone taper and [facility]. Per Dr. Joni Fears he has no other complaints, no shortness of breath, no hemoptysis, no fevers or chills. Of note it appears that patient's group home discontinued his prednisone taper without completing it. Patient was discussed with ophthalmology here at [facility] who recommended no specific interventions at present but did recommend admission to [facility] so he could be evaluated by them. Patient is accepted for transfer here for observation.

## 2018-12-15 NOTE — Plan of Care (Signed)

## 2018-12-15 NOTE — ED Notes (Signed)
Denies CP or SOB at this time

## 2018-12-15 NOTE — Progress Notes (Addendum)
MD made RN aware that Dr.Marchase needs to be consulted during the day hours for pt right eye. There phone number is [phone]. Will continue to monitor.

## 2018-12-16 ENCOUNTER — Encounter (HOSPITAL_COMMUNITY): Payer: Self-pay

## 2018-12-16 DIAGNOSIS — I132 Hypertensive heart and chronic kidney disease with heart failure and with stage 5 chronic kidney disease, or end stage renal disease: Secondary | ICD-10-CM | POA: Diagnosis present

## 2018-12-16 DIAGNOSIS — M3131 Wegener's granulomatosis with renal involvement: Secondary | ICD-10-CM

## 2018-12-16 DIAGNOSIS — D631 Anemia in chronic kidney disease: Secondary | ICD-10-CM | POA: Diagnosis not present

## 2018-12-16 DIAGNOSIS — I776 Arteritis, unspecified: Secondary | ICD-10-CM | POA: Diagnosis present

## 2018-12-16 DIAGNOSIS — I10 Essential (primary) hypertension: Secondary | ICD-10-CM | POA: Diagnosis not present

## 2018-12-16 DIAGNOSIS — I2510 Atherosclerotic heart disease of native coronary artery without angina pectoris: Secondary | ICD-10-CM | POA: Diagnosis present

## 2018-12-16 DIAGNOSIS — I428 Other cardiomyopathies: Secondary | ICD-10-CM | POA: Diagnosis present

## 2018-12-16 DIAGNOSIS — Z9114 Patient's other noncompliance with medication regimen: Secondary | ICD-10-CM | POA: Diagnosis not present

## 2018-12-16 DIAGNOSIS — I120 Hypertensive chronic kidney disease with stage 5 chronic kidney disease or end stage renal disease: Secondary | ICD-10-CM | POA: Diagnosis not present

## 2018-12-16 DIAGNOSIS — H547 Unspecified visual loss: Secondary | ICD-10-CM | POA: Diagnosis present

## 2018-12-16 DIAGNOSIS — E785 Hyperlipidemia, unspecified: Secondary | ICD-10-CM | POA: Diagnosis present

## 2018-12-16 DIAGNOSIS — N2581 Secondary hyperparathyroidism of renal origin: Secondary | ICD-10-CM | POA: Diagnosis not present

## 2018-12-16 DIAGNOSIS — E1122 Type 2 diabetes mellitus with diabetic chronic kidney disease: Secondary | ICD-10-CM | POA: Diagnosis present

## 2018-12-16 DIAGNOSIS — N186 End stage renal disease: Secondary | ICD-10-CM | POA: Diagnosis not present

## 2018-12-16 DIAGNOSIS — H5712 Ocular pain, left eye: Secondary | ICD-10-CM | POA: Diagnosis not present

## 2018-12-16 DIAGNOSIS — R739 Hyperglycemia, unspecified: Secondary | ICD-10-CM

## 2018-12-16 DIAGNOSIS — K219 Gastro-esophageal reflux disease without esophagitis: Secondary | ICD-10-CM | POA: Diagnosis present

## 2018-12-16 DIAGNOSIS — T380X5A Adverse effect of glucocorticoids and synthetic analogues, initial encounter: Secondary | ICD-10-CM | POA: Diagnosis present

## 2018-12-16 DIAGNOSIS — E8889 Other specified metabolic disorders: Secondary | ICD-10-CM | POA: Diagnosis present

## 2018-12-16 DIAGNOSIS — H5710 Ocular pain, unspecified eye: Secondary | ICD-10-CM | POA: Diagnosis present

## 2018-12-16 DIAGNOSIS — Z992 Dependence on renal dialysis: Secondary | ICD-10-CM | POA: Diagnosis not present

## 2018-12-16 DIAGNOSIS — H5711 Ocular pain, right eye: Secondary | ICD-10-CM | POA: Diagnosis not present

## 2018-12-16 DIAGNOSIS — H05111 Granuloma of right orbit: Secondary | ICD-10-CM | POA: Diagnosis present

## 2018-12-16 DIAGNOSIS — D696 Thrombocytopenia, unspecified: Secondary | ICD-10-CM | POA: Diagnosis present

## 2018-12-16 DIAGNOSIS — M3130 Wegener's granulomatosis without renal involvement: Secondary | ICD-10-CM | POA: Diagnosis present

## 2018-12-16 DIAGNOSIS — I5042 Chronic combined systolic (congestive) and diastolic (congestive) heart failure: Secondary | ICD-10-CM | POA: Diagnosis present

## 2018-12-16 DIAGNOSIS — I4891 Unspecified atrial fibrillation: Secondary | ICD-10-CM | POA: Diagnosis present

## 2018-12-16 DIAGNOSIS — H0520 Unspecified exophthalmos: Secondary | ICD-10-CM | POA: Diagnosis present

## 2018-12-16 DIAGNOSIS — E1165 Type 2 diabetes mellitus with hyperglycemia: Secondary | ICD-10-CM | POA: Diagnosis present

## 2018-12-16 DIAGNOSIS — Z95810 Presence of automatic (implantable) cardiac defibrillator: Secondary | ICD-10-CM | POA: Diagnosis not present

## 2018-12-16 DIAGNOSIS — F209 Schizophrenia, unspecified: Secondary | ICD-10-CM | POA: Diagnosis present

## 2018-12-16 LAB — BASIC METABOLIC PANEL
Anion gap: 11 (ref 5–15)
BUN: 60 mg/dL — ABNORMAL HIGH (ref 6–20)
CO2: 24 mmol/L (ref 22–32)
Calcium: 9.6 mg/dL (ref 8.9–10.3)
Chloride: 105 mmol/L (ref 98–111)
Creatinine, Ser: 11.97 mg/dL — ABNORMAL HIGH (ref 0.61–1.24)
GFR calc Af Amer: 5 mL/min — ABNORMAL LOW (ref >60)
GFR calc non Af Amer: 4 mL/min — ABNORMAL LOW (ref >60)
Glucose, Bld: 94 mg/dL (ref 70–99)
Potassium: 4.9 mmol/L (ref 3.5–5.1)
Sodium: 140 mmol/L (ref 135–145)

## 2018-12-16 LAB — GLUCOSE, CAPILLARY
Glucose-Capillary: 110 mg/dL — ABNORMAL HIGH (ref 70–99)
Glucose-Capillary: 136 mg/dL — ABNORMAL HIGH (ref 70–99)
Glucose-Capillary: 166 mg/dL — ABNORMAL HIGH (ref 70–99)
Glucose-Capillary: 149 mg/dL — ABNORMAL HIGH (ref 70–99)

## 2018-12-16 LAB — CBC
HCT: 34.1 % — ABNORMAL LOW (ref 39.0–52.0)
Hemoglobin: 10.9 g/dL — ABNORMAL LOW (ref 13.0–17.0)
MCH: 31.5 pg (ref 26.0–34.0)
MCHC: 32 g/dL (ref 30.0–36.0)
MCV: 98.6 fL (ref 80.0–100.0)
Platelets: 124 10*3/uL — ABNORMAL LOW (ref 150–400)
RBC: 3.46 MIL/uL — ABNORMAL LOW (ref 4.22–5.81)
RDW: 15.9 % — ABNORMAL HIGH (ref 11.5–15.5)
WBC: 9 10*3/uL (ref 4.0–10.5)
nRBC: 0 % (ref 0.0–0.2)

## 2018-12-16 LAB — C-REACTIVE PROTEIN: CRP: 10.7 mg/dL — ABNORMAL HIGH (ref <1.0)

## 2018-12-16 MED ORDER — IPRATROPIUM-ALBUTEROL 0.5-2.5 (3) MG/3ML IN SOLN: 3.0000 mL | RESPIRATORY_TRACT | Status: DC | PRN

## 2018-12-16 MED ORDER — SODIUM CHLORIDE 0.9 % IV SOLN
250.0000 mg | Freq: Four times a day (QID) | INTRAVENOUS | Status: DC
  Administered 2018-12-16 (×3): 250 mg via INTRAVENOUS
  Filled 2018-12-16 (×6): qty 2

## 2018-12-16 MED ORDER — CALCIUM ACETATE (PHOS BINDER) 667 MG PO CAPS
1334.0000 mg | ORAL_CAPSULE | Freq: Three times a day (TID) | ORAL | Status: DC
  Filled 2018-12-16 (×2): qty 2

## 2018-12-16 MED ORDER — METHYLPREDNISOLONE SODIUM SUCC 125 MG IJ SOLR
60.0000 mg | Freq: Once | INTRAMUSCULAR | Status: AC
  Administered 2018-12-16: 60 mg via INTRAVENOUS
  Filled 2018-12-16: qty 2

## 2018-12-16 MED ORDER — TRAZODONE HCL 100 MG PO TABS
100.0000 mg | ORAL_TABLET | Freq: Every day | ORAL | Status: DC
  Administered 2018-12-16 – 2018-12-18 (×3): 100 mg via ORAL
  Filled 2018-12-16 (×3): qty 1

## 2018-12-16 MED ORDER — CHLORHEXIDINE GLUCONATE CLOTH 2 % EX PADS: 6.0000 | MEDICATED_PAD | Freq: Every day | CUTANEOUS | Status: DC

## 2018-12-16 MED ORDER — CALCITRIOL 0.5 MCG PO CAPS: 1.0000 ug | ORAL_CAPSULE | ORAL | Status: DC

## 2018-12-16 MED ORDER — CIPROFLOXACIN HCL 0.3 % OP SOLN
1.0000 [drp] | OPHTHALMIC | Status: DC
  Administered 2018-12-16 – 2018-12-19 (×15): 1 [drp] via OPHTHALMIC
  Filled 2018-12-16: qty 2.5

## 2018-12-16 MED ORDER — CYCLOPENTOLATE HCL 1 % OP SOLN
3.0000 [drp] | Freq: Once | OPHTHALMIC | Status: DC
  Filled 2018-12-16: qty 2

## 2018-12-16 MED ORDER — RISPERIDONE 0.25 MG PO TABS
0.2500 mg | ORAL_TABLET | Freq: Every day | ORAL | Status: DC
  Administered 2018-12-16 – 2018-12-18 (×3): 0.25 mg via ORAL
  Filled 2018-12-16 (×4): qty 1

## 2018-12-16 MED ORDER — CALCIUM ACETATE (PHOS BINDER) 667 MG PO CAPS
1334.0000 mg | ORAL_CAPSULE | Freq: Three times a day (TID) | ORAL | Status: DC
  Administered 2018-12-16 – 2018-12-18 (×9): 1334 mg via ORAL
  Filled 2018-12-16 (×7): qty 2

## 2018-12-16 MED ORDER — CALCITRIOL 0.5 MCG PO CAPS
1.0000 ug | ORAL_CAPSULE | ORAL | Status: DC
  Administered 2018-12-17 – 2018-12-19 (×2): 1 ug via ORAL
  Filled 2018-12-16: qty 2

## 2018-12-16 MED ORDER — GABAPENTIN 300 MG PO CAPS
300.0000 mg | ORAL_CAPSULE | Freq: Two times a day (BID) | ORAL | Status: DC
  Administered 2018-12-16 – 2018-12-18 (×6): 300 mg via ORAL
  Filled 2018-12-16 (×6): qty 1

## 2018-12-16 MED ORDER — PANTOPRAZOLE SODIUM 40 MG PO TBEC
40.0000 mg | DELAYED_RELEASE_TABLET | Freq: Every day | ORAL | Status: DC
  Administered 2018-12-16 – 2018-12-18 (×3): 40 mg via ORAL
  Filled 2018-12-16 (×3): qty 1

## 2018-12-16 NOTE — Progress Notes (Addendum)
Progress Note    James Nicholson  NOB:[id number] DOB: [birth year]  DOA: 12/15/2018 PCP: [name], MD    Brief Narrative:   Chief complaint: Right eye pain  Medical records reviewed and are as summarized below:  James Nicholson is an 53 y.o. [place] speaking  male with pmh of HTN, HLD, question DM type II, V. fib arrest s/p AICD, Wegener's granulomatous disease, CHF EF 45%, who presented with right eye pain and swelling the right side of his face started 2 days ago.  Similar episode happened in December 2019.  For which patient had been prescribed steroid taper.  Patient currently living in a group home and [facility] transferred from [facility].  Assessment/Plan:   Principal Problem:   Eye pain, right Active Problems:   Hypertension   Automatic implantable cardioverter-defibrillator in situ   Anemia in chronic kidney disease   ESRD on dialysis ([place])   Wegener's granulomatosis ([place])   Type II diabetes mellitus with renal manifestations ([place])   GERD (gastroesophageal reflux disease)   Eye pain  1.  Right eye pain 2/2 orbital pseudotumor secondary to Wegener's flare: Acute on chronic: Possibly due to vascular granulomatosis flareup as patient with similar presentation previously.  ESR 10 and CRP 10.7.  CT scan showing right orbital orbital granuloma that has regressed since 10/08/2018 with mass currently measuring 2.5 cm with proptosis.  Ophthalmologist, Dr. Noel Nicholson was consulted of [facility] in the emergency department on 2/18.  Patient had been given 60 mg of IV Solu-Medrol on 2/19 at 1251am.   Review of records shows that patient had significant improvement of symptoms with high-dose IV steroids after discussion with pharmacy of 250 mg every 6 hours for 2 days, and then was transitioned to 60 mg of prednisone daily. -Solu-Medrol 250 mg every 6 hours x1 day and tapered to p.o. -Continue ciprofloxacin eyedrop -I personally  discussed case with Dr. Noel Nicholson, who reported that he would be coming to see the patient at around 8 AM, and then again around 6 PM.  He asked for 3 drops of cyclopentolate ophthalmic solution to be placed in right eye to dilated it as he was coming to see the patient.  Nursing staff notified me that no one had come as of yet.  2.  Wegener's granulomatosis: No respiratory symptoms at this time.  Last hospitalization patient was cared for by Dr. [name] -Duo nebs as needed -Will need to ensure outpatient pulmonary follow-up or notify Dr. [name]   3.  Essential hypertension: not taking meds at home -IV hydralazine prn  4.  Anemia in chronic kidney disease: stable. Hgb 11.6-> 10.9 -Continue to monitor  5.  ESRD on dialysis (TTS): Patient last dialyzed on Saturday [place].  Currently denies any complaints of shortness of breath nephrology was consulted in the emergency department.  He has a right upper extremity fistula. -Check renal function panel in a.m. -HD per nephrology scheduled for 2/20  6.  Steroid-induced hyperglycemia vs. diabetes mellitus type 2: Last A1c 4.8 on 10/04/18 and 4.5 in 2015. Patient was reportedly on Levemir at home, but suspect that this was related with high-dose steroids. -Hypoglycemic protocols -Continue Levemir -CBGs with sliding scale insulin -Adjust dose as needed  7.  History of V. fib arrest status post ICD placed  8.  History of schizophrenia -Continue risperidone  9.  GERD (gastroesophageal reflux disease): Stable -Continue Protonix  10.  Thrombocytopenia: Acute.  Platelet count 132 on admission and appears to  be 124 today.  No reports of bleeding per patient.  Continue to monitor  11.  Systolic congestive heart failure: Chronic.  Patient appears to be euvolemic at this time.  Last EF noted to be 45%.   Body mass index is 25.91 kg/m.   Family Communication/Anticipated D/C date and plan/Code Status   DVT prophylaxis: Heparin  ordered. Code Status: Full Code.  Family Communication: No family present at bedside Disposition Plan: Possible discharge home in 1 to 2 days   Medical Consultants:    Ophthalmology   Anti-Infectives:    Ciprofloxacin eyedrops day 2  Subjective:   Patient reports pain and swelling of the right eye.  Denies any recent sick contacts to his knowledge, fever, chills, or shortness of breath.  He currently lives in a group home in [place] and is not fully aware of medications as someone gives them to him.  Objective:    Vitals:   12/15/18 1900 12/16/18 0132 12/16/18 1231 12/16/18 2044  BP:   129/79 127/71  Pulse:   70 83  Resp:    12  Temp:   97.7 F (36.5 C) 98.1 F (36.7 C)  TempSrc:   Oral Oral  SpO2:   97% 93%  Weight: 78.3 kg 77.3 kg    Height: '5\' 8"'$  (1.727 m)       Intake/Output Summary (Last 24 hours) at 12/17/2018 0256 Last data filed at 12/16/2018 2350 Gross per 24 hour  Intake 1175 ml  Output 250 ml  Net 925 ml   Filed Weights   12/15/18 1900 12/16/18 0132  Weight: 78.3 kg 77.3 kg    Exam: Constitutional: NAD, calm, comfortable Eyes: 8 AM assessment: right eye proptosis with conjunctival injection and tearing.  With swelling of upper and lower eyelids unable to open eye 5 PM assessment: Significant reduction in swelling with patient almost able to fully open eye ENMT: Mucous membranes are moist. Posterior pharynx clear of any exudate or lesions. Neck: normal, supple, no masses, no thyromegaly Respiratory: clear to auscultation bilaterally, no wheezing, no crackles. Normal respiratory effort. No accessory muscle use.  Cardiovascular: Regular rate and rhythm, no murmurs / rubs / gallops. No extremity edema. 2+ pedal pulses.  Right forearm fistula Abdomen: no tenderness, no masses palpated. No hepatosplenomegaly. Bowel sounds positive.  Musculoskeletal: no clubbing / cyanosis. No joint deformity upper and lower extremities. Good ROM, no contractures. Normal  muscle tone.  Skin: no rashes, lesions, ulcers. No induration Neurologic: CN 2-12 grossly intact. Sensation intact, DTR normal. Strength 5/5 in all 4.  Psychiatric: Normal judgment and insight. Alert and oriented x 3. Normal mood.    Data Reviewed:   I have personally reviewed following labs and imaging studies:  Labs: Labs show the following:   Basic Metabolic Panel: Recent Labs  Lab 12/15/18 0823 12/16/18 0326  NA 141 140  K 4.1 4.9  CL 105 105  CO2 28 24  GLUCOSE 86 94  BUN 52* 60*  CREATININE 10.03* 11.97*  CALCIUM 10.3 9.6   GFR Estimated Creatinine Clearance: 7 mL/min (A) (by C-G formula based on SCr of 11.97 mg/dL (H)). Liver Function Tests: Recent Labs  Lab 12/15/18 0823  AST 21  ALT 28  ALKPHOS 34*  BILITOT 0.7  PROT 6.0*  ALBUMIN 3.3*   No results for input(s): LIPASE, AMYLASE in the last 168 hours. No results for input(s): AMMONIA in the last 168 hours. Coagulation profile No results for input(s): INR, PROTIME in the last 168 hours.  CBC:  Recent Labs  Lab 12/15/18 0823 12/16/18 0326  WBC 8.9 9.0  NEUTROABS 6.3  --   HGB 11.6* 10.9*  HCT 36.0* 34.1*  MCV 98.6 98.6  PLT 132* 124*   Cardiac Enzymes: No results for input(s): CKTOTAL, CKMB, CKMBINDEX, TROPONINI in the last 168 hours. BNP (last 3 results) No results for input(s): PROBNP in the last 8760 hours. CBG: Recent Labs  Lab 12/15/18 2148 12/16/18 0744 12/16/18 1147 12/16/18 1630 12/16/18 2144  GLUCAP 103* 110* 136* 166* 149*   D-Dimer: No results for input(s): DDIMER in the last 72 hours. Hgb A1c: No results for input(s): HGBA1C in the last 72 hours. Lipid Profile: No results for input(s): CHOL, HDL, LDLCALC, TRIG, CHOLHDL, LDLDIRECT in the last 72 hours. Thyroid function studies: No results for input(s): TSH, T4TOTAL, T3FREE, THYROIDAB in the last 72 hours.  Invalid input(s): FREET3 Anemia work up: No results for input(s): VITAMINB12, FOLATE, FERRITIN, TIBC, IRON,  RETICCTPCT in the last 72 hours. Sepsis Labs: Recent Labs  Lab 12/15/18 0823 12/16/18 0326  WBC 8.9 9.0    Microbiology Recent Results (from the past 240 hour(s))  MRSA PCR Screening     Status: None   Collection Time: 12/16/18 10:30 PM  Result Value Ref Range Status   MRSA by PCR NEGATIVE NEGATIVE Final    Comment:        The GeneXpert MRSA Assay (FDA approved for NASAL specimens only), is one component of a comprehensive MRSA colonization surveillance program. It is not intended to diagnose MRSA infection nor to guide or monitor treatment for MRSA infections. Performed at [facility], [place] [address] [place], [place] [address]     Procedures and diagnostic studies:  Ct Orbits Wo Contrast  Result Date: 12/15/2018 CLINICAL DATA:  History of Wegner's granulomatosis. Recurrent right eye swelling and proptosis. EXAM: CT ORBITS WITHOUT CONTRAST TECHNIQUE: Multidetector CT images were obtained using the standard protocol without intravenous contrast. COMPARISON:  10/08/2018 FINDINGS: Orbits: Regression of ill-defined lobulated mass in the intra and extraconal right orbit, currently 25 x 22 mm on axial slices as compared to 35 x 26 mm. There is still proptosis on the right. Stable bony dehiscence in the medial and anterior right orbital wall. Visualized sinuses: Mild mucosal thickening, greatest in the frontal sinuses, that has improved since prior. Chronic defect in the right anterior uncinate process and right middle turbinate. Resolved left mastoid and middle ear opacification. Soft tissues: No evidence of acute inflammation. Limited intracranial: Negative IMPRESSION: 1. History of Wegner's disease with right orbital granuloma that has regressed since 10/08/2018 comparison. The mass currently measures up to 2.5 cm and continues to cause proptosis. 2. Improved sinusitis and resolved left mastoid opacification. Electronically Signed   By: Monte Fantasia M.D.   On: 12/15/2018 09:07     Medications:   . calcitRIOL  1 mcg Oral Q T,Th,Sa-HD  . calcium acetate  1,334 mg Oral TID WC  . Chlorhexidine Gluconate Cloth  6 each Topical Q0600  . ciprofloxacin  1 drop Right Eye Q4H while awake  . cyclopentolate  3 drop Right Eye Once  . gabapentin  300 mg Oral BID  . heparin  5,000 Units Subcutaneous Q8H  . insulin aspart  0-5 Units Subcutaneous QHS  . insulin aspart  0-9 Units Subcutaneous TID WC  . pantoprazole  40 mg Oral Daily  . risperiDONE  0.25 mg Oral QHS  . traZODone  100 mg Oral QHS   Continuous Infusions: . methylPREDNISolone (SOLU-MEDROL) injection  250 mg (12/16/18 2319)     LOS: 2 days   Yizel Canby A Fain Francis  [facility]   *Please refer to [email or web address], password [email or web address] to get updated schedule on who will round on this patient, as hospitalists switch teams weekly. If 7PM-7AM, please contact night-coverage at [email or web address], password [email or web address] for any overnight needs.

## 2018-12-16 NOTE — Consult Note (Addendum)
[facility] Renal Consultation Note    Indication for Consultation:  Management of ESRD/hemodialysis, anemia, hypertension/volume, and secondary hyperparathyroidism. PCP:  HPI: James Nicholson is a 53 y.o. male with ESRD d/t Wegener's, HTN, Hx VT arrest (s/p ICD), schizophrenia, NICM who was admitted with acute onset R eye edema/proptosis.   Pt unclear of medications, just knows that R eye started swelling again 2 days ago. Presented to [facility] ED on 2/18 for evaluation. Labs were stable, facial CT showed persistent R eye orbital pseudotumor. Denies CP/dyspnea/cough. No fever or chills. S/p prolonged [facility] admit in 09/2018 with flare of his Wegener's with cough, dyspnea, and bulging R eye which was found to be R orbital pseudotumor on imaging - he was treated with IV steroid and 4 doses rituximab. Has been slowly tapering prednisone over the past few months, as directed by his pulmonologist (Dr. [name]). He has been living in a group home in [place] for the past month - per notes, there is some question that his prednisone was stopped prematurely which prompted current symptoms.   Dialyzes at [facility] on TTS schedule - missed his last HD (yesterday). Last dialyzed 2/16. He has been having reduced clearance with recent HD - is scheduled for fistulogram as outpatient on 2/26.  Past Medical History:  Diagnosis Date  . Anemia   . Atrial fibrillation   . Cardiomyopathy ([place]) 2010   Unclear Etiology: Last Echo 06/2009: EF 40-45%, severe Lateral & apical Hypokinesis (? CAD) ; Grade 2 DDysfxn. Mild conc LVH.   Marland Kitchen Cellulitis   . Chronic kidney disease (CKD), stage V ([place])    Dialysis Tue, Thurs, Sat  . Dyslipidemia   . Enterobacter sepsis ([place])   . Hypertension   . IDDM (insulin dependent diabetes mellitus) ([place]) 11/03/2014  . S/P ICD (internal cardiac defibrillator) procedure 2010   VT Arrest (in [place])  . Schizophrenia ([place])   . Wegener's disease, pulmonary ([place])  02/05/2013  . Wegener's granulomatosis ([place])    Past Surgical History:  Procedure Laterality Date  . AV FISTULA PLACEMENT Right 02/10/2013   Procedure: ARTERIOVENOUS (AV) FISTULA CREATION;  Surgeon: Rosetta Posner, MD;  Location[facility] OR;  Service: Vascular;  Laterality: Right;  Right forearm radial/cephalic arterovenous fistula.   Marland Kitchen CARDIAC CATHETERIZATION  2010   [place]: In setting of VT arrest. Per brother's report, nonobstructive with no intervention  . CARDIAC DEFIBRILLATOR PLACEMENT  2010   [place]  . FISTULOGRAM Right 08/09/2013   Procedure: FISTULOGRAM;  Surgeon: Angelia Mould, MD;  Location[facility] C[facility] LAB;  Service: Cardiovascular;  Laterality: Right;  . ICD GENERATOR CHANGEOUT N/A 12/05/2017   Procedure: ICD GENERATOR CHANGEOUT;  Surgeon: Evans Lance, MD;  Location: [facility] CV LAB;  Service: Cardiovascular;  Laterality: N/A;  . LIGATION OF COMPETING BRANCHES OF ARTERIOVENOUS FISTULA Right 08/13/2013   Procedure: LIGATION OF COMPETING BRANCHES X5 OF ARTERIOVENOUS FISTULA- RIGHT ARM;  Surgeon: Serafina Mitchell, MD;  Location: [facility] OR;  Service: Vascular;  Laterality: Right;   Family History  Problem Relation Age of Onset  . CAD Mother    Social History:  reports that he quit smoking about 10 years ago. He has never used smokeless tobacco. He reports that he does not drink alcohol or use drugs.  ROS: As per HPI otherwise negative.  Physical Exam: Vitals:   12/15/18 1900 12/16/18 0132  Weight: 78.3 kg 77.3 kg  Height: 5\' 8"  (1.727 m)      General: Well developed, well nourished, in no  acute distress. Head/ENT: Normocephalic, R eye with diffuse lid and conjunctival edema. Mild proptosis. Neck: Supple without lymphadenopathy/masses. JVD not elevated. Lungs: Clear bilaterally to auscultation without wheezes, rales, or rhonchi. Breathing is unlabored. Heart: RRR with normal S1, S2. No murmurs, rubs, or gallops appreciated. Abdomen: Soft, non-tender, non-distended with  normoactive bowel sounds. No rebound/guarding. Musculoskeletal:  Strength and tone appear normal for age. Lower extremities: No edema or ischemic changes, no open wounds. Neuro: Alert and oriented X 3. Moves all extremities spontaneously. Psych:  Responds to questions appropriately with a normal affect. Dialysis Access: R forearm AVF + bruit  No Known Allergies Prior to Admission medications   Medication Sig Start Date End Date Taking? Authorizing Provider  calcitRIOL (ROCALTROL) 0.5 MCG capsule Take 2 capsules (1 mcg total) by mouth every Monday, Wednesday, and Friday with hemodialysis. 11/09/18  Yes Medina-Vargas, Monina C, NP  calcium acetate (PHOSLO) 667 MG capsule Take 2 capsules (1,334 mg total) by mouth 3 (three) times daily with meals. 8a, 12p, 5p 11/09/18  Yes Medina-Vargas, Monina C, NP  gabapentin (NEURONTIN) 300 MG capsule Take 1 capsule (300 mg total) by mouth 2 (two) times daily. 11/09/18  Yes Medina-Vargas, Monina C, NP  Insulin Detemir (LEVEMIR) 100 UNIT/ML Pen Inject 5 Units into the skin at bedtime.   Yes [provider]  ipratropium-albuterol (DUONEB) 0.5-2.5 (3) MG/3ML SOLN Take 3 mLs by nebulization every 4 (four) hours as needed for up to 30 days. 11/09/18 12/16/18 Yes Medina-Vargas, Monina C, NP  metoprolol succinate (TOPROL-XL) 50 MG 24 hr tablet Take 50 mg by mouth daily. 12/03/18  Yes [provider]  omeprazole (PRILOSEC) 20 MG capsule Take 20 mg by mouth every morning.    Yes [provider]  risperiDONE (RISPERDAL) 0.25 MG tablet Take 1 tablet (0.25 mg total) by mouth at bedtime. 11/09/18  Yes Medina-Vargas, Monina C, NP  traZODone (DESYREL) 100 MG tablet Take 1 tablet (100 mg total) by mouth at bedtime. 11/09/18  Yes Medina-Vargas, Monina C, NP  insulin aspart (NOVOLOG FLEXPEN) 100 UNIT/ML FlexPen Inject 0-5 Units into the skin 3 (three) times daily with meals. 201-250 = 2 units, 251-300 = 3 units, 301-350 = 4 units, 351-400 = 5 units Patient not  taking: Reported on 12/16/2018 11/09/18   Medina-Vargas, Monina C, NP  Insulin Pen Needle 32G X 5 MM MISC 1 each by Does not apply route daily as needed. 11/09/18   Medina-Vargas, Monina C, NP   Current Facility-Administered Medications  Medication Dose Route Frequency Provider Last Rate Last Dose  . acetaminophen (TYLENOL) tablet 650 mg  650 mg Oral Q6H PRN Ivor Costa, MD   650 mg at 12/16/18 0935   Or  . acetaminophen (TYLENOL) suppository 650 mg  650 mg Rectal Q6H PRN Ivor Costa, MD      . calcitRIOL (ROCALTROL) capsule 1 mcg  1 mcg Oral Q M,W,F-HD Ivor Costa, MD      . calcium acetate (PHOSLO) capsule 1,334 mg  1,334 mg Oral TID WC Ivor Costa, MD   1,334 mg at 12/16/18 0935  . ciprofloxacin (CILOXAN) 0.3 % ophthalmic solution 1 drop  1 drop Right Eye Q4H while awake Ivor Costa, MD   1 drop at 12/16/18 0935  . gabapentin (NEURONTIN) capsule 300 mg  300 mg Oral BID Ivor Costa, MD   300 mg at 12/16/18 0934  . heparin injection 5,000 Units  5,000 Units Subcutaneous Q8H Ivor Costa, MD   5,000 Units at 12/16/18 0529  . hydrALAZINE (APRESOLINE)  injection 5 mg  5 mg Intravenous Q2H PRN Ivor Costa, MD      . insulin aspart (novoLOG) injection 0-5 Units  0-5 Units Subcutaneous QHS Ivor Costa, MD      . insulin aspart (novoLOG) injection 0-9 Units  0-9 Units Subcutaneous TID WC Ivor Costa, MD      . ipratropium-albuterol (DUONEB) 0.5-2.5 (3) MG/3ML nebulizer solution 3 mL  3 mL Nebulization Q4H PRN Ivor Costa, MD      . methylPREDNISolone sodium succinate (SOLU-MEDROL) 250 mg in sodium chloride 0.9 % 50 mL IVPB  250 mg Intravenous Q6H Smith, Rondell A, MD      . ondansetron (ZOFRAN) tablet 4 mg  4 mg Oral Q6H PRN Ivor Costa, MD       Or  . ondansetron (ZOFRAN) injection 4 mg  4 mg Intravenous Q6H PRN Ivor Costa, MD      . pantoprazole (PROTONIX) EC tablet 40 mg  40 mg Oral Daily Ivor Costa, MD   40 mg at 12/16/18 0935  . risperiDONE (RISPERDAL) tablet 0.25 mg  0.25 mg Oral QHS Ivor Costa, MD      .  senna-docusate (Senokot-S) tablet 1 tablet  1 tablet Oral QHS PRN Ivor Costa, MD      . traZODone (DESYREL) tablet 100 mg  100 mg Oral QHS Ivor Costa, MD       Labs: Basic Metabolic Panel: Recent Labs  Lab 12/15/18 0823 12/16/18 0326  NA 141 140  K 4.1 4.9  CL 105 105  CO2 28 24  GLUCOSE 86 94  BUN 52* 60*  CREATININE 10.03* 11.97*  CALCIUM 10.3 9.6   Liver Function Tests: Recent Labs  Lab 12/15/18 0823  AST 21  ALT 28  ALKPHOS 34*  BILITOT 0.7  PROT 6.0*  ALBUMIN 3.3*   CBC: Recent Labs  Lab 12/15/18 0823 12/16/18 0326  WBC 8.9 9.0  NEUTROABS 6.3  --   HGB 11.6* 10.9*  HCT 36.0* 34.1*  MCV 98.6 98.6  PLT 132* 124*   CBG: Recent Labs  Lab 12/15/18 2148 12/16/18 0744 12/16/18 1147  GLUCAP 103* 110* 136*   Studies/Results: Ct Orbits Wo Contrast  Result Date: 12/15/2018 CLINICAL DATA:  History of Wegner's granulomatosis. Recurrent right eye swelling and proptosis. EXAM: CT ORBITS WITHOUT CONTRAST TECHNIQUE: Multidetector CT images were obtained using the standard protocol without intravenous contrast. COMPARISON:  10/08/2018 FINDINGS: Orbits: Regression of ill-defined lobulated mass in the intra and extraconal right orbit, currently 25 x 22 mm on axial slices as compared to 35 x 26 mm. There is still proptosis on the right. Stable bony dehiscence in the medial and anterior right orbital wall. Visualized sinuses: Mild mucosal thickening, greatest in the frontal sinuses, that has improved since prior. Chronic defect in the right anterior uncinate process and right middle turbinate. Resolved left mastoid and middle ear opacification. Soft tissues: No evidence of acute inflammation. Limited intracranial: Negative IMPRESSION: 1. History of Wegner's disease with right orbital granuloma that has regressed since 10/08/2018 comparison. The mass currently measures up to 2.5 cm and continues to cause proptosis. 2. Improved sinusitis and resolved left mastoid opacification.  Electronically Signed   By: Monte Fantasia M.D.   On: 12/15/2018 09:07   Dialysis Orders:  TTS at [facility] 4hr, 400/800, EDW 77kg, 2K/2Ca, AVF, heparin 7600 bolus - Calcitriol 60mcg PO q HD  Assessment/Plan: 1.  R eye pain/edema: Looks similar to eye appearence during 09/2018 admit with Wegener's flare. CT showed regression of R  orbital granuloma from prior imaging. Of note - I see him regularly as outpatient and his eye had improved to 100% normal external appearance following last hospitalization. Possible early discontinuation of prednisone - unclear. IV solumedrol given here - ophthalmology consult pending. 2.  ESRD: Usual TTS schedule - missed last HD, but appears stable with labs and from respiratory standpoint, will plan to pick up with usual TTS schedule tomorrow AM. 3.  Hypertension/volume: BP controlled, no edema on exam.  4.  Anemia: Hgb 10.9 - follow without ESA for now. 5.  Metabolic bone disease: Corr Ca slightly high - holding VDRA. 6.  Wegener's granulomatosis: Eye symptoms, as above. No pulm issues at this time.    Veneta Penton, PA-C 12/16/2018, 12:03 PM  [facility] Pager: [phone]  Pt seen, examined and agree w A/P as above.  [facility] 12/16/2018, 3:51 PM

## 2018-12-16 NOTE — Progress Notes (Signed)
Pt still waiting for eye doctor to come see him tonight, Dr. Tamala Julian came to check the patient and is aware that eye doctor has not yet come to see the patient.

## 2018-12-16 NOTE — Progress Notes (Signed)
Interpreter used for [employer] patinet complains of pain in left eye that is swollen and draining can open the eye  A little with vision intact. Plan to wash eye with eye drops and poss discharge today.

## 2018-12-16 NOTE — Care Management Obs Status (Signed)
[place] NOTIFICATION   Patient Details  Name: James Nicholson MRN: [id number] Date of Birth: 02-22-1966   Medicare Observation Status Notification Given:  Yes    Carles Collet, RN 12/16/2018, 11:01 AM

## 2018-12-17 DIAGNOSIS — H5712 Ocular pain, left eye: Secondary | ICD-10-CM

## 2018-12-17 LAB — GLUCOSE, CAPILLARY
Glucose-Capillary: 105 mg/dL — ABNORMAL HIGH (ref 70–99)
Glucose-Capillary: 131 mg/dL — ABNORMAL HIGH (ref 70–99)
Glucose-Capillary: 116 mg/dL — ABNORMAL HIGH (ref 70–99)
Glucose-Capillary: 132 mg/dL — ABNORMAL HIGH (ref 70–99)

## 2018-12-17 LAB — RENAL FUNCTION PANEL
Albumin: 2.5 g/dL — ABNORMAL LOW (ref 3.5–5.0)
Anion gap: 15 (ref 5–15)
BUN: 88 mg/dL — ABNORMAL HIGH (ref 6–20)
CO2: 21 mmol/L — ABNORMAL LOW (ref 22–32)
Calcium: 9.8 mg/dL (ref 8.9–10.3)
Chloride: 101 mmol/L (ref 98–111)
Creatinine, Ser: 13.96 mg/dL — ABNORMAL HIGH (ref 0.61–1.24)
GFR calc Af Amer: 4 mL/min — ABNORMAL LOW (ref >60)
GFR calc non Af Amer: 4 mL/min — ABNORMAL LOW (ref >60)
Glucose, Bld: 148 mg/dL — ABNORMAL HIGH (ref 70–99)
Phosphorus: 4.1 mg/dL (ref 2.5–4.6)
Potassium: 5.6 mmol/L — ABNORMAL HIGH (ref 3.5–5.1)
Sodium: 137 mmol/L (ref 135–145)

## 2018-12-17 LAB — CBC
HCT: 30.9 % — ABNORMAL LOW (ref 39.0–52.0)
Hemoglobin: 10.2 g/dL — ABNORMAL LOW (ref 13.0–17.0)
MCH: 31.6 pg (ref 26.0–34.0)
MCHC: 33 g/dL (ref 30.0–36.0)
MCV: 95.7 fL (ref 80.0–100.0)
Platelets: 131 10*3/uL — ABNORMAL LOW (ref 150–400)
RBC: 3.23 MIL/uL — ABNORMAL LOW (ref 4.22–5.81)
RDW: 15.4 % (ref 11.5–15.5)
WBC: 8.1 10*3/uL (ref 4.0–10.5)
nRBC: 0 % (ref 0.0–0.2)

## 2018-12-17 LAB — MRSA PCR SCREENING: MRSA by PCR: NEGATIVE

## 2018-12-17 MED ORDER — ALTEPLASE 2 MG IJ SOLR: 2.0000 mg | Freq: Once | INTRAMUSCULAR | Status: DC | PRN

## 2018-12-17 MED ORDER — PENTAFLUOROPROP-TETRAFLUOROETH EX AERO: 1.0000 "application " | INHALATION_SPRAY | CUTANEOUS | Status: DC | PRN

## 2018-12-17 MED ORDER — LIDOCAINE-PRILOCAINE 2.5-2.5 % EX CREA
1.0000 "application " | TOPICAL_CREAM | CUTANEOUS | Status: DC | PRN
  Filled 2018-12-17: qty 5

## 2018-12-17 MED ORDER — HEPARIN SODIUM (PORCINE) 1000 UNIT/ML DIALYSIS
20.0000 [IU]/kg | INTRAMUSCULAR | Status: DC | PRN
  Filled 2018-12-17: qty 2

## 2018-12-17 MED ORDER — SODIUM CHLORIDE 0.9 % IV SOLN: 100.0000 mL | INTRAVENOUS | Status: DC | PRN

## 2018-12-17 MED ORDER — SODIUM CHLORIDE 0.9 % IV SOLN
INTRAVENOUS | Status: DC | PRN
  Administered 2018-12-17 – 2018-12-18 (×2): 250 mL via INTRAVENOUS

## 2018-12-17 MED ORDER — LIDOCAINE HCL (PF) 1 % IJ SOLN: 5.0000 mL | INTRAMUSCULAR | Status: DC | PRN

## 2018-12-17 MED ORDER — METOPROLOL SUCCINATE ER 50 MG PO TB24
50.0000 mg | ORAL_TABLET | Freq: Every day | ORAL | Status: DC
  Administered 2018-12-17 – 2018-12-18 (×2): 50 mg via ORAL
  Filled 2018-12-17 (×2): qty 1

## 2018-12-17 MED ORDER — CALCITRIOL 0.5 MCG PO CAPS
ORAL_CAPSULE | ORAL | Status: AC
  Administered 2018-12-17: 1 ug via ORAL
  Filled 2018-12-17: qty 2

## 2018-12-17 MED ORDER — SODIUM CHLORIDE 0.9 % IV SOLN
500.0000 mg | Freq: Every day | INTRAVENOUS | Status: DC
  Administered 2018-12-18: 500 mg via INTRAVENOUS
  Filled 2018-12-17 (×2): qty 4

## 2018-12-17 MED ORDER — SODIUM CHLORIDE 0.9 % IV SOLN
250.0000 mg | Freq: Two times a day (BID) | INTRAVENOUS | Status: AC
  Administered 2018-12-17 (×2): 250 mg via INTRAVENOUS
  Filled 2018-12-17 (×2): qty 2

## 2018-12-17 MED ORDER — INSULIN DETEMIR 100 UNIT/ML ~~LOC~~ SOLN
5.0000 [IU] | Freq: Every day | SUBCUTANEOUS | Status: DC
  Administered 2018-12-17 – 2018-12-18 (×2): 5 [IU] via SUBCUTANEOUS
  Filled 2018-12-17 (×3): qty 0.05

## 2018-12-17 MED ORDER — CALCITRIOL 0.5 MCG PO CAPS
ORAL_CAPSULE | ORAL | Status: AC
  Filled 2018-12-17: qty 1

## 2018-12-17 MED ORDER — HEPARIN SODIUM (PORCINE) 1000 UNIT/ML DIALYSIS
1000.0000 [IU] | INTRAMUSCULAR | Status: DC | PRN
  Filled 2018-12-17: qty 1

## 2018-12-17 NOTE — Clinical Social Work Note (Signed)
Clinical Social Work Assessment  Patient Details  Name: James Nicholson MRN: [id number] Date of Birth: 26-Oct-1966  Date of referral:  12/17/18               Reason for consult:  Discharge Planning                Permission sought to share information with:  Facility Sport and exercise psychologist, Family Supports Permission granted to share information::     Name::     Velcro Karnes  Agency::  [facility]  Relationship::  Brother  Contact Information:  [phone]  Housing/Transportation Living arrangements for the past 2 months:  [place] of Information:  Medical Team, Siblings Patient Interpreter Needed:  None Criminal Activity/Legal Involvement Pertinent to Current Situation/Hospitalization:  No - Comment as needed Significant Relationships:  Siblings Lives with:  Facility Resident Do you feel safe going back to the place where you live?  Yes Need for family participation in patient care:  Yes (Comment)  Care giving concerns:  Patient is from [place].   Social Worker assessment / plan:  Patient in HD. Brother in room. CSW introduced role and explained that discharge planning would be discussed. Patient's brother confirmed he is from [place] and the plan is for him to return at discharge. Brother voiced concern for progression of patient's health. No further concerns. CSW encouraged patient's brother to contact CSW as needed. CSW will continue to follow patient and his brother for support and facilitate discharge back to group home once stable for discharge.  Employment status:  Disabled (Comment on whether or not currently receiving Disability) Insurance information:  Medicare PT Recommendations:  Not assessed at this time Information / Referral to community resources:  Other (Comment Required)(Plan is to return to group home.)  Patient/Family's Response to care:  Patient in HD. Patient's  brother agreeable to return to group home. Patient's brother supportive and involved in patient's care. Patient's brother appreciated social work intervention.  Patient/Family's Understanding of and Emotional Response to Diagnosis, Current Treatment, and Prognosis:  Patient in HD. Patient's brother has a good understanding of the reason for admission and social work consult. Patient's brother appears pleased with hospital care although upset he hasn't seen an opthamologist yet.  Emotional Assessment Appearance:  Appears stated age Attitude/Demeanor/Rapport:  Unable to Assess Affect (typically observed):  Unable to Assess Orientation:  Oriented to Self, Oriented to Place, Oriented to  Time, Oriented to Situation Alcohol / Substance use:  Never Used Psych involvement (Current and /or in the community):  No (Comment)  Discharge Needs  Concerns to be addressed:  Care Coordination Readmission within the last 30 days:  No Current discharge risk:  None Barriers to Discharge:  Continued Medical Work up   Candie Chroman, LCSW 12/17/2018, 12:42 PM

## 2018-12-17 NOTE — Progress Notes (Signed)
PROGRESS NOTE    James Nicholson  UKG:[id number] DOB: 10-26-1966 DOA: 12/15/2018 PCP: [name], MD   Brief Narrative:  53 year old with past medical history relevant for VT arrest status post AICD, type 2 diabetes on insulin, schizophrenia, hypertension chronic systolic and diastolic heart failure (EF of 45 to 50% with diffuse hypokinesis by echo on 12/23/2013), ESRD on Tuesday/Thursday/Saturday dialysis, Wegener's granulomatosis with pulmonary manifestations and ocular pseudotumor who presented to [facility] with eye pain and was found to have recurrent right-sided ocular pseudotumor transferred here for further evaluation.   Assessment & Plan:   Principal Problem:   Eye pain, right Active Problems:   Hypertension   Automatic implantable cardioverter-defibrillator in situ   Anemia in chronic kidney disease   ESRD on dialysis ([place])   Wegener's granulomatosis ([place])   Type II diabetes mellitus with renal manifestations ([place])   GERD (gastroesophageal reflux disease)   Eye pain   #) Wegener's granulomatosis with ocular pseudotumor: Patient appears to be symptomatic from this.  He was seen by ophthalmology today and they have not documented their exam but reported verbally that the patient could follow-up as an outpatient in clinic.  On review of the chart patient completed 4 doses of rituximab and was discharged on a prolonged steroid taper by outpatient pulmonology but it is not clear patient received a steroid taper as he is a member of a group home. -Pulmonology consult for immunosuppressive regimen -Continue IV methylprednisolne 500 mg daily day 2 of 3 -No active pulmonary manifestations at this time  #) ESRD on Tuesday/Thursday/Saturday dialysis: -Nephrology consulted appreciate recommendations -Continue calcitriol -Continue calcium acetate with meals  #) VT arrest status post AICD/chronic systolic and grade 1 diastolic heart failure: Volume status being managed with  dialysis  #) Hypertension: - Metoprolol succinate 50 mg daily  #) Pain/psych: -Continue gabapentin 300 mg twice daily -Continue risperidone 0.25 mg nightly  #) Type 2 diabetes: -Continue detemir 5 units nightly -Sliding scale insulin, AC at bedtime  Fluids: Restricted Electrolytes: Monitor and supplement Nutrition: Renal diet   Prophylaxis: Subcu heparin   Disposition: Pending pulmonology recommendations for immunosuppression  Full code    Consultants:   Nephrology  Pulmonology  Ophthalmology  Procedures:   None  Antimicrobials:   None   Subjective: This morning the patient reports he is not in any pain.  Seen in dialysis.  He denies any nausea, vomiting, diarrhea, cough, congestion, rhinorrhea.  Objective: Vitals:   12/17/18 0930 12/17/18 1000 12/17/18 1030 12/17/18 1100  BP: (!) 146/85 135/76 131/78 136/81  Pulse: 87 85 93 88  Resp:      Temp:      TempSrc:      SpO2:      Weight:      Height:        Intake/Output Summary (Last 24 hours) at 12/17/2018 1148 Last data filed at 12/17/2018 0524 Gross per 24 hour  Intake 975 ml  Output 200 ml  Net 775 ml   Filed Weights   12/16/18 0132 12/17/18 0424 12/17/18 0753  Weight: 77.3 kg 78.1 kg 78 kg    Examination:  General exam: Appears calm and comfortable  Respiratory system: Clear to auscultation. Respiratory effort normal.  Scattered rhonchi Cardiovascular system: Regular rate and rhythm, 2 out of 6 systolic murmur transmitted from fistula Gastrointestinal system: Abdomen is nondistended, soft and nontender. No organomegaly or masses felt. Normal bowel sounds heard. Central nervous system: Alert and oriented.  Ecchymotic right eye, mildly proptotic, no pain with eye  movements. Extremities: No lower extremity edema, fistula site with palpable thrill and audible bruit Skin: No rashes over visible skin Psychiatry: Judgement and insight appear normal. Mood & affect appropriate.     Data  Reviewed: I have personally reviewed following labs and imaging studies  CBC: Recent Labs  Lab 12/15/18 0823 12/16/18 0326 12/17/18 0400  WBC 8.9 9.0 8.1  NEUTROABS 6.3  --   --   HGB 11.6* 10.9* 10.2*  HCT 36.0* 34.1* 30.9*  MCV 98.6 98.6 95.7  PLT 132* 124* 856*   Basic Metabolic Panel: Recent Labs  Lab 12/15/18 0823 12/16/18 0326 12/17/18 0400  NA 141 140 137  K 4.1 4.9 5.6*  CL 105 105 101  CO2 28 24 21*  GLUCOSE 86 94 148*  BUN 52* 60* 88*  CREATININE 10.03* 11.97* 13.96*  CALCIUM 10.3 9.6 9.8  PHOS  --   --  4.1   GFR: Estimated Creatinine Clearance: 6 mL/min (A) (by C-G formula based on SCr of 13.96 mg/dL (H)). Liver Function Tests: Recent Labs  Lab 12/15/18 0823 12/17/18 0400  AST 21  --   ALT 28  --   ALKPHOS 34*  --   BILITOT 0.7  --   PROT 6.0*  --   ALBUMIN 3.3* 2.5*   No results for input(s): LIPASE, AMYLASE in the last 168 hours. No results for input(s): AMMONIA in the last 168 hours. Coagulation Profile: No results for input(s): INR, PROTIME in the last 168 hours. Cardiac Enzymes: No results for input(s): CKTOTAL, CKMB, CKMBINDEX, TROPONINI in the last 168 hours. BNP (last 3 results) No results for input(s): PROBNP in the last 8760 hours. HbA1C: No results for input(s): HGBA1C in the last 72 hours. CBG: Recent Labs  Lab 12/15/18 2148 12/16/18 0744 12/16/18 1147 12/16/18 1630 12/16/18 2144  GLUCAP 103* 110* 136* 166* 149*   Lipid Profile: No results for input(s): CHOL, HDL, LDLCALC, TRIG, CHOLHDL, LDLDIRECT in the last 72 hours. Thyroid Function Tests: No results for input(s): TSH, T4TOTAL, FREET4, T3FREE, THYROIDAB in the last 72 hours. Anemia Panel: No results for input(s): VITAMINB12, FOLATE, FERRITIN, TIBC, IRON, RETICCTPCT in the last 72 hours. Sepsis Labs: No results for input(s): PROCALCITON, LATICACIDVEN in the last 168 hours.  Recent Results (from the past 240 hour(s))  MRSA PCR Screening     Status: None   Collection  Time: 12/16/18 10:30 PM  Result Value Ref Range Status   MRSA by PCR NEGATIVE NEGATIVE Final    Comment:        The GeneXpert MRSA Assay (FDA approved for NASAL specimens only), is one component of a comprehensive MRSA colonization surveillance program. It is not intended to diagnose MRSA infection nor to guide or monitor treatment for MRSA infections. Performed at [facility], [place] [address], [place], [place] [address]          Radiology Studies: No results found.      Scheduled Meds: . calcitRIOL  1 mcg Oral Q T,Th,Sa-HD  . calcium acetate  1,334 mg Oral TID WC  . Chlorhexidine Gluconate Cloth  6 each Topical Q0600  . ciprofloxacin  1 drop Right Eye Q4H while awake  . cyclopentolate  3 drop Right Eye Once  . gabapentin  300 mg Oral BID  . heparin  5,000 Units Subcutaneous Q8H  . insulin aspart  0-5 Units Subcutaneous QHS  . insulin aspart  0-9 Units Subcutaneous TID WC  . insulin detemir  5 Units Subcutaneous QHS  .  metoprolol succinate  50 mg Oral Daily  . pantoprazole  40 mg Oral Daily  . risperiDONE  0.25 mg Oral QHS  . traZODone  100 mg Oral QHS   Continuous Infusions: . sodium chloride    . sodium chloride    . methylPREDNISolone (SOLU-MEDROL) injection       LOS: 2 days    Time spent: [place], MD [facility]  If 7PM-7AM, please contact night-coverage [email or web address] Password [employer] [facility] [employer] 12/17/2018, 11:48 AM

## 2018-12-17 NOTE — Consult Note (Signed)
NAMEGraison Nicholson, MRN:  [id number], DOB:  05/11/1966, LOS: 2 ADMISSION DATE:  12/15/2018, CONSULTATION DATE:  12/17/18 REFERRING MD:  Dr. Herbert Moors CHIEF COMPLAINT:  R Eye Pain / Edema   Brief History   53 y.o. M with PMH of Wegeners Granulomatosis with pulmonary manifestations (followed as outpatient by Dr. [name]), admitted 12/18 with eye pain and eye edema.  Treated with IV steroids which resolved symptoms. PCCM consulted 2/20 for assistance with outpatient pred taper regimen.  History of present illness   James Nicholson is a 53 y.o. M with PMH including but not limited to biopsy proven Wegeners Granulomatosis with pulmonary manifestations, followed by Dr. [name] (lung needle bx from 12/11 with granulomatous inflammation with neutrophilic mico-abscesses).  He is s/p 4 treatments of Rituxan (10/13/18, 10/22/18, 10/30/18, 11/06/18) and has been on bactrim prophylactically as well as a prolonged prednisone taper with total of 20 weeks planned (started 12/19 after 3 days of pulse dose solumedrol).  He was admitted 2/18 with eye pain and eyelid swelling and was found to have a recurrent right sided ocular pseudotumor.  He was started on IV solumedrol at 250mg  q6hrs and received 3 doses of this before being switched to 250mg  q12hrs.   His eye pain and edema resolved with steroids.  He was evaluated by ophthalmology who felt that pt could be followed up as an outpatient.    On 2/20, PCCM was asked to see in consultation to assist with transitioning his steroids to PO and planning a taper for discharge.  Past Medical History  Wegener's Granulomatosis, ESRD, IDDM, HTN, HLD, CM, A.fib, Schizophrenia    Significant Hospital Events   12/18 > admit  Consults:  Nephrology. Ophthalmology. PCCM.  Procedures:  None.  Significant Diagnostic Tests:  CT orbits 12/18 > right orbital granuloma that has regressed since 12/12 comparison.  Micro Data:  None.  Antimicrobials:  None.    Interim history/subjective:  As above  Objective   Blood pressure 125/75, pulse 83, temperature 98 F (36.7 C), temperature source Oral, resp. rate 16, height 5\' 8"  (1.727 m), weight 78 kg, SpO2 96 %.        Intake/Output Summary (Last 24 hours) at 12/17/2018 1530 Last data filed at 12/17/2018 1255 Gross per 24 hour  Intake 940 ml  Output 2200 ml  Net -1260 ml   Filed Weights   12/16/18 0132 12/17/18 0424 12/17/18 0753  Weight: 77.3 kg 78.1 kg 78 kg    Examination: Gen: well appearing, no acute distress HENT: NCAT, OP clear, neck supple without masses Eyes: PERRL, EOMi, mild swelling around left eye Lymph: no cervical lymphadenopathy PULM: CTA B CV: RRR, no mgr, no JVD GI: BS+, soft, nontender, no hsm Derm: no rash or skin breakdown MSK: normal bulk and tone Neuro: A&Ox4, CN II-XII intact, strength 5/5 in all 4 extremities Psyche: normal mood and affect   Resolved Hospital Problem list      Assessment & Plan:  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  Based on reported exams during this hospitalization it seems as if the swelling recurred to a much lesser degree and now has improved again over the last 48 hours with steroids.  In general, I think he has had a very good response to rituximab.  I suspect that this most recent mild flareup of his ocular disease is related to prednisone noncompliance. -  We will give 3 total doses of IV Solu-Medrol 500 mg IV, the final dose will be given on February 21, then I think he could be discharged home -He needs to be discharged on prednisone 60 mg daily until he is seen by the pulmonary clinic. -If he has recurrence of his ocular disease despite the recent rituximab and the current steroid then he needs to be seen by the [facility] ophthalmology clinic.  Best practice:    Labs   CBC: Recent Labs   Lab 12/15/18 0823 12/16/18 0326 12/17/18 0400  WBC 8.9 9.0 8.1  NEUTROABS 6.3  --   --   HGB 11.6* 10.9* 10.2*  HCT 36.0* 34.1* 30.9*  MCV 98.6 98.6 95.7  PLT 132* 124* 131*    Basic Metabolic Panel: Recent Labs  Lab 12/15/18 0823 12/16/18 0326 12/17/18 0400  NA 141 140 137  K 4.1 4.9 5.6*  CL 105 105 101  CO2 28 24 21*  GLUCOSE 86 94 148*  BUN 52* 60* 88*  CREATININE 10.03* 11.97* 13.96*  CALCIUM 10.3 9.6 9.8  PHOS  --   --  4.1   GFR: Estimated Creatinine Clearance: 6 mL/min (A) (by C-G formula based on SCr of 13.96 mg/dL (H)). Recent Labs  Lab 12/15/18 0823 12/16/18 0326 12/17/18 0400  WBC 8.9 9.0 8.1    Liver Function Tests: Recent Labs  Lab 12/15/18 0823 12/17/18 0400  AST 21  --   ALT 28  --   ALKPHOS 34*  --   BILITOT 0.7  --   PROT 6.0*  --   ALBUMIN 3.3* 2.5*   No results for input(s): LIPASE, AMYLASE in the last 168 hours. No results for input(s): AMMONIA in the last 168 hours.  ABG    Component Value Date/Time   PHART 7.289 (L) 02/05/2013 0146   PCO2ART 29.0 (L) 02/05/2013 0146   PO2ART 81.0 02/05/2013 0146   HCO3 13.9 (L) 02/05/2013 0146   TCO2 29 07/07/2016 0843   ACIDBASEDEF 12.0 (H) 02/05/2013 0146   O2SAT 95.0 02/05/2013 0146     Coagulation Profile: No results for input(s): INR, PROTIME in the last 168 hours.  Cardiac Enzymes: No results for input(s): CKTOTAL, CKMB, CKMBINDEX, TROPONINI in the last 168 hours.  HbA1C: Hgb A1c MFr Bld  Date/Time Value Ref Range Status  10/04/2018 09:34 PM 4.8 4.8 - 5.6 % Final    Comment:    (NOTE) Pre diabetes:          5.7%-6.4% Diabetes:              >6.4% Glycemic control for   <7.0% adults with diabetes   12/21/2013 08:10 PM 4.5 <5.7 % Final    Comment:    (NOTE)                                                                       According to the ADA Clinical Practice Recommendations for 2011, when HbA1c is used as a screening test:  >=6.5%   Diagnostic of Diabetes  [place]           (if abnormal result is confirmed) 5.7-6.4%   Increased risk of developing Diabetes Mellitus References:Diagnosis and Classification of Diabetes Mellitus,Diabetes QION,6295,28(UXLKG 1):S62-S69 and Standards of  Medical Care in         Diabetes - 2011,Diabetes Care,2011,34 (Suppl 1):S11-S61.    CBG: Recent Labs  Lab 12/16/18 0744 12/16/18 1147 12/16/18 1630 12/16/18 2144 12/17/18 1252  GLUCAP 110* 136* 166* 149* 105*   Gen: Denies fever, chills, weight change, fatigue, night sweats HEENT: Denies blurred vision, double vision, hearing loss, tinnitus, sinus congestion, rhinorrhea, sore throat, neck stiffness, dysphagia PULM: Denies shortness of breath, cough, sputum production, hemoptysis, wheezing CV: Denies chest pain, edema, orthopnea, paroxysmal nocturnal dyspnea, palpitations GI: Denies abdominal pain, nausea, vomiting, diarrhea, hematochezia, melena, constipation, change in bowel habits GU: Denies dysuria, hematuria, polyuria, oliguria, urethral discharge Endocrine: Denies hot or cold intolerance, polyuria, polyphagia or appetite change Derm: Denies rash, dry skin, scaling or peeling skin change Heme: Denies easy bruising, bleeding, bleeding gums Neuro: Denies headache, numbness, weakness, slurred speech, loss of memory or consciousness  Roselie Awkward, MD [place] [facility] Pager: [phone] Cell: [phone] If no response, call [phone]   12/17/2018, 3:30 PM

## 2018-12-17 NOTE — Progress Notes (Signed)
[facility] Progress Note  Subjective: no new c/o's. Has rec'd 3-4 IV solumedrol doses, his eye looks remarkably better.   Vitals:   12/17/18 1130 12/17/18 1200 12/17/18 1207 12/17/18 1255  BP: 131/81 132/78 127/71 125/75  Pulse: 82 89 85 83  Resp:   16   Temp:   98 F (36.7 C)   TempSrc:   Oral   SpO2:   95% 96%  Weight:      Height:        Inpatient medications: . calcitRIOL  1 mcg Oral Q T,Th,Sa-HD  . calcium acetate  1,334 mg Oral TID WC  . Chlorhexidine Gluconate Cloth  6 each Topical Q0600  . ciprofloxacin  1 drop Right Eye Q4H while awake  . cyclopentolate  3 drop Right Eye Once  . gabapentin  300 mg Oral BID  . heparin  5,000 Units Subcutaneous Q8H  . insulin aspart  0-5 Units Subcutaneous QHS  . insulin aspart  0-9 Units Subcutaneous TID WC  . insulin detemir  5 Units Subcutaneous QHS  . metoprolol succinate  50 mg Oral Daily  . pantoprazole  40 mg Oral Daily  . risperiDONE  0.25 mg Oral QHS  . traZODone  100 mg Oral QHS   . methylPREDNISolone (SOLU-MEDROL) injection     acetaminophen **OR** acetaminophen, hydrALAZINE, ipratropium-albuterol, ondansetron **OR** ondansetron (ZOFRAN) IV, senna-docusate  Iron/TIBC/Ferritin/ %Sat    Component Value Date/Time   IRON 82 02/05/2013 1900   TIBC 162 (L) 02/05/2013 1900   FERRITIN 1,377 (H) 02/05/2013 1900   IRONPCTSAT 51 02/05/2013 1900    Exam: General: Well developed, well nourished, in no acute distress. Head/ENT: Normocephalic, R eye diffuse has completely resolved overnight , there is only remaining a slight conj injection Neck: JVD not elevated. Lungs: Clear bilaterally to auscultation without wheezes, rales, or rhonchi Heart: RRR with normal S1, S2 Abdomen: Soft, non-tender, non-distended with normoactive bowel sounds Musculoskeletal:  Strength and tone appear normal for age. Lower extremities: No edema or ischemic changes, no open wounds. Neuro: Alert and oriented X 3. Moves all  extremities spontaneously. Dialysis Access: R forearm AVF + bruit   [facility] [place] [facility]  4h  400/800  77kg  2/2 bath  AVF RFA  Hep 7600  - calcitriol 1 ug tiw po       Assessment: 1. R eye pain/edema: Looks similar to eye appearence during 09/2018 admission. After 3-4 IV doses of solumedrol the edema of the eye has totally resolved overnight and there is only remaining slight conjunctival injection. He is feeling better. There is the matter of the po steroid (prednisone) taper, pulm consult is being consulted.  2.  ESRD: on HD TTS.  HD today.  3.  Hypertension/volume: BP controlled, no edema on exam.  4.  Anemia: Hgb 10.9 - follow without ESA for now. 5.  Metabolic bone disease: Corr Ca slightly high - holding VDRA. 6.  Wegener's granulomatosis    P: 1. HD today on schedule       [facility] 12/17/2018, 1:06 PM  Recent Labs  Lab 12/15/18 0823 12/16/18 0326 12/17/18 0400  NA 141 140 137  K 4.1 4.9 5.6*  CL 105 105 101  CO2 28 24 21*  GLUCOSE 86 94 148*  BUN 52* 60* 88*  CREATININE 10.03* 11.97* 13.96*  CALCIUM 10.3 9.6 9.8  PHOS  --   --  4.1  ALBUMIN 3.3*  --  2.5*   Recent Labs  Lab 12/15/18  0823  AST 21  ALT 28  ALKPHOS 34*  BILITOT 0.7  PROT 6.0*   Recent Labs  Lab 12/15/18 0823 12/16/18 0326 12/17/18 0400  WBC 8.9 9.0 8.1  NEUTROABS 6.3  --   --   HGB 11.6* 10.9* 10.2*  HCT 36.0* 34.1* 30.9*  MCV 98.6 98.6 95.7  PLT 132* 124* 131*

## 2018-12-18 ENCOUNTER — Other Ambulatory Visit: Payer: Self-pay | Admitting: Ophthalmology

## 2018-12-18 LAB — RENAL FUNCTION PANEL
Albumin: 2.6 g/dL — ABNORMAL LOW (ref 3.5–5.0)
Anion gap: 12 (ref 5–15)
BUN: 49 mg/dL — ABNORMAL HIGH (ref 6–20)
CO2: 26 mmol/L (ref 22–32)
Calcium: 8.7 mg/dL — ABNORMAL LOW (ref 8.9–10.3)
Chloride: 97 mmol/L — ABNORMAL LOW (ref 98–111)
Creatinine, Ser: 7.68 mg/dL — ABNORMAL HIGH (ref 0.61–1.24)
GFR calc Af Amer: 8 mL/min — ABNORMAL LOW (ref >60)
GFR calc non Af Amer: 7 mL/min — ABNORMAL LOW (ref >60)
Glucose, Bld: 136 mg/dL — ABNORMAL HIGH (ref 70–99)
Phosphorus: 4.4 mg/dL (ref 2.5–4.6)
Potassium: 4.7 mmol/L (ref 3.5–5.1)
Sodium: 135 mmol/L (ref 135–145)

## 2018-12-18 LAB — GLUCOSE, CAPILLARY
Glucose-Capillary: 116 mg/dL — ABNORMAL HIGH (ref 70–99)
Glucose-Capillary: 151 mg/dL — ABNORMAL HIGH (ref 70–99)
Glucose-Capillary: 202 mg/dL — ABNORMAL HIGH (ref 70–99)
Glucose-Capillary: 122 mg/dL — ABNORMAL HIGH (ref 70–99)

## 2018-12-18 MED ORDER — CHLORHEXIDINE GLUCONATE CLOTH 2 % EX PADS
6.0000 | MEDICATED_PAD | Freq: Every day | CUTANEOUS | Status: DC
  Administered 2018-12-18: 6 via TOPICAL

## 2018-12-18 NOTE — Progress Notes (Signed)
PROGRESS NOTE    James Nicholson  TZG:[id number] DOB: 1966-10-14 DOA: 12/15/2018 PCP: [name], MD   Brief Narrative:  53 year old with past medical history relevant for VT arrest status post AICD, type 2 diabetes on insulin, schizophrenia, hypertension chronic systolic and diastolic heart failure (EF of 45 to 50% with diffuse hypokinesis by echo on 12/23/2013), ESRD on Tuesday/Thursday/Saturday dialysis, Wegener's granulomatosis with pulmonary manifestations and ocular pseudotumor who presented to [facility] with eye pain and was found to have recurrent right-sided ocular pseudotumor transferred here for further evaluation.   Assessment & Plan:   Principal Problem:   Eye pain, right Active Problems:   Hypertension   Automatic implantable cardioverter-defibrillator in situ   Anemia in chronic kidney disease   ESRD on dialysis ([place])   Wegener's granulomatosis ([place])   Type II diabetes mellitus with renal manifestations (H[facility])   GERD (gastroesophageal reflux disease)   Eye pain   #) Wegener's granulomatosis with ocular pseudotumor: Improving on IV Solu-Medrol. -Pulmonology consult for immunosuppressive regimen appreciate recommendations they feel like this is not a failure of rituximab and oral prednisone but rather noncompliance with prolonged prednisone taper and a small flare -Continue IV methylprednisolne 500 mg daily day 3 of 3, will discharge on oral prednisone 60 mg daily until patient is followed up as an outpatient with pulmonology, Dr. [name] -No active pulmonary manifestations at this time -We will need outpatient ophthalmology follow-up  #) ESRD on Tuesday/Thursday/Saturday dialysis: -Nephrology consulted appreciate recommendations -Continue calcitriol -Continue calcium acetate with meals  #) VT arrest status post AICD/chronic systolic and grade 1 diastolic heart failure: Volume status being managed with dialysis  #) Hypertension: - Metoprolol succinate 50 mg  daily  #) Pain/psych: -Continue gabapentin 300 mg twice daily -Continue risperidone 0.25 mg nightly  #) Type 2 diabetes: -Continue detemir 5 units nightly -Sliding scale insulin, AC at bedtime  Fluids: Restricted Electrolytes: Monitor and supplement Nutrition: Renal diet   Prophylaxis: Subcu heparin   Disposition: Pending completion of IV steroids  Full code    Consultants:   Nephrology  Pulmonology  Ophthalmology  Procedures:   None  Antimicrobials:   None   Subjective: This morning the patient reports he is not in any pain.  He reports no more tearing.  He has had any nausea, vomiting, diarrhea, cough, congestion, rhinorrhea. Objective: Vitals:   12/17/18 1255 12/17/18 1954 12/18/18 0358 12/18/18 0930  BP: 125/75 117/75 119/82 114/69  Pulse: 83 79 68 72  Resp:  16 18   Temp:  97.7 F (36.5 C) 97.7 F (36.5 C)   TempSrc:  Oral Oral   SpO2: 96% 96% 98% 96%  Weight:   76.7 kg   Height:        Intake/Output Summary (Last 24 hours) at 12/18/2018 1057 Last data filed at 12/18/2018 0934 Gross per 24 hour  Intake 1261.61 ml  Output 2300 ml  Net -1038.39 ml   Filed Weights   12/17/18 0424 12/17/18 0753 12/18/18 0358  Weight: 78.1 kg 78 kg 76.7 kg    Examination:  General exam: Appears calm and comfortable  Respiratory system: Clear to auscultation. Respiratory effort normal.  Scattered rhonchi Cardiovascular system: Regular rate and rhythm, 2 out of 6 systolic murmur transmitted from fistula Gastrointestinal system: Abdomen is nondistended, soft and nontender. No organomegaly or masses felt. Normal bowel sounds heard. Central nervous system: Alert and oriented.  Ecchymotic right eye, mildly proptotic, no pain with eye movements. Extremities: No lower extremity edema, fistula site with palpable thrill and  audible bruit Skin: No rashes over visible skin Psychiatry: Judgement and insight appear normal. Mood & affect appropriate.     Data Reviewed:  I have personally reviewed following labs and imaging studies  CBC: Recent Labs  Lab 12/15/18 0823 12/16/18 0326 12/17/18 0400  WBC 8.9 9.0 8.1  NEUTROABS 6.3  --   --   HGB 11.6* 10.9* 10.2*  HCT 36.0* 34.1* 30.9*  MCV 98.6 98.6 95.7  PLT 132* 124* 924*   Basic Metabolic Panel: Recent Labs  Lab 12/15/18 0823 12/16/18 0326 12/17/18 0400 12/18/18 0422  NA 141 140 137 135  K 4.1 4.9 5.6* 4.7  CL 105 105 101 97*  CO2 28 24 21* 26  GLUCOSE 86 94 148* 136*  BUN 52* 60* 88* 49*  CREATININE 10.03* 11.97* 13.96* 7.68*  CALCIUM 10.3 9.6 9.8 8.7*  PHOS  --   --  4.1 4.4   GFR: Estimated Creatinine Clearance: 10.9 mL/min (A) (by C-G formula based on SCr of 7.68 mg/dL (H)). Liver Function Tests: Recent Labs  Lab 12/15/18 0823 12/17/18 0400 12/18/18 0422  AST 21  --   --   ALT 28  --   --   ALKPHOS 34*  --   --   BILITOT 0.7  --   --   PROT 6.0*  --   --   ALBUMIN 3.3* 2.5* 2.6*   No results for input(s): LIPASE, AMYLASE in the last 168 hours. No results for input(s): AMMONIA in the last 168 hours. Coagulation Profile: No results for input(s): INR, PROTIME in the last 168 hours. Cardiac Enzymes: No results for input(s): CKTOTAL, CKMB, CKMBINDEX, TROPONINI in the last 168 hours. BNP (last 3 results) No results for input(s): PROBNP in the last 8760 hours. HbA1C: No results for input(s): HGBA1C in the last 72 hours. CBG: Recent Labs  Lab 12/17/18 1252 12/17/18 1647 12/17/18 2104 12/17/18 2119 12/18/18 0741  GLUCAP 105* 131* 132* 116* 116*   Lipid Profile: No results for input(s): CHOL, HDL, LDLCALC, TRIG, CHOLHDL, LDLDIRECT in the last 72 hours. Thyroid Function Tests: No results for input(s): TSH, T4TOTAL, FREET4, T3FREE, THYROIDAB in the last 72 hours. Anemia Panel: No results for input(s): VITAMINB12, FOLATE, FERRITIN, TIBC, IRON, RETICCTPCT in the last 72 hours. Sepsis Labs: No results for input(s): PROCALCITON, LATICACIDVEN in the last 168  hours.  Recent Results (from the past 240 hour(s))  MRSA PCR Screening     Status: None   Collection Time: 12/16/18 10:30 PM  Result Value Ref Range Status   MRSA by PCR NEGATIVE NEGATIVE Final    Comment:        The GeneXpert MRSA Assay (FDA approved for NASAL specimens only), is one component of a comprehensive MRSA colonization surveillance program. It is not intended to diagnose MRSA infection nor to guide or monitor treatment for MRSA infections. Performed at [facility], [place] [address], [place], [place] [address]          Radiology Studies: No results found.      Scheduled Meds: . calcitRIOL  1 mcg Oral Q T,Th,Sa-HD  . calcium acetate  1,334 mg Oral TID WC  . Chlorhexidine Gluconate Cloth  6 each Topical Q0600  . ciprofloxacin  1 drop Right Eye Q4H while awake  . cyclopentolate  3 drop Right Eye Once  . gabapentin  300 mg Oral BID  . heparin  5,000 Units Subcutaneous Q8H  . insulin aspart  0-5 Units Subcutaneous QHS  . insulin aspart  0-9 Units Subcutaneous TID WC  . insulin detemir  5 Units Subcutaneous QHS  . metoprolol succinate  50 mg Oral Daily  . pantoprazole  40 mg Oral Daily  . risperiDONE  0.25 mg Oral QHS  . traZODone  100 mg Oral QHS   Continuous Infusions: . sodium chloride 250 mL (12/18/18 0945)  . methylPREDNISolone (SOLU-MEDROL) injection 500 mg (12/18/18 0946)     LOS: 3 days    Time spent: [place], MD [facility]  If 7PM-7AM, please contact night-coverage [email or web address] Password [facility] 12/18/2018, 10:57 AM

## 2018-12-18 NOTE — Progress Notes (Deleted)
[facility] Progress Note  Subjective: no new c/o's. Seen by pulm for immunosuppression / Wegener's.   Vitals:   12/17/18 1255 12/17/18 1954 12/18/18 0358 12/18/18 0930  BP: 125/75 117/75 119/82 114/69  Pulse: 83 79 68 72  Resp:  16 18   Temp:  97.7 F (36.5 C) 97.7 F (36.5 C)   TempSrc:  Oral Oral   SpO2: 96% 96% 98% 96%  Weight:   76.7 kg   Height:        Inpatient medications: . calcitRIOL  1 mcg Oral Q T,Th,Sa-HD  . calcium acetate  1,334 mg Oral TID WC  . Chlorhexidine Gluconate Cloth  6 each Topical Q0600  . ciprofloxacin  1 drop Right Eye Q4H while awake  . cyclopentolate  3 drop Right Eye Once  . gabapentin  300 mg Oral BID  . heparin  5,000 Units Subcutaneous Q8H  . insulin aspart  0-5 Units Subcutaneous QHS  . insulin aspart  0-9 Units Subcutaneous TID WC  . insulin detemir  5 Units Subcutaneous QHS  . metoprolol succinate  50 mg Oral Daily  . pantoprazole  40 mg Oral Daily  . risperiDONE  0.25 mg Oral QHS  . traZODone  100 mg Oral QHS   . sodium chloride 250 mL (12/18/18 0945)  . methylPREDNISolone (SOLU-MEDROL) injection 500 mg (12/18/18 0946)   sodium chloride, acetaminophen **OR** acetaminophen, hydrALAZINE, ipratropium-albuterol, ondansetron **OR** ondansetron (ZOFRAN) IV, senna-docusate  Iron/TIBC/Ferritin/ %Sat    Component Value Date/Time   IRON 82 02/05/2013 1900   TIBC 162 (L) 02/05/2013 1900   FERRITIN 1,377 (H) 02/05/2013 1900   IRONPCTSAT 51 02/05/2013 1900    Exam: General: Well developed, well nourished, in no acute distress. Head/ENT: Normocephalic, R eye diffuse has completely resolved overnight , there is only remaining a slight conj injection Neck: JVD not elevated. Lungs: Clear bilaterally to auscultation without wheezes, rales, or rhonchi Heart: RRR with normal S1, S2 Abdomen: Soft, non-tender, non-distended with normoactive bowel sounds Musculoskeletal:  Strength and tone appear normal for age. Lower extremities:  No edema or ischemic changes, no open wounds. Neuro: Alert and oriented X 3. Moves all extremities spontaneously. Dialysis Access: R forearm AVF + bruit   [facility] [place] TTS  4h  400/800  77kg  2/2 bath  AVF RFA  Hep 7600  - calcitriol 1 ug tiw po       Assessment: 1. R eye pain/edema: much better, back to baseline probably on exam.  Seen by pulm who recommends IV bolus steroids through today and then prednisone 60/d until seen in office by CCM.  They suggest if eye disease reoccurs despite prior Rituxan and appropriate steroid dosing they would suggest referral to [facility].  2.  ESRD: on HD TTS.  HD sat.  3.  Hypertension/volume: BP controlled, no edema on exam.  4.  Anemia: Hgb 10.9 - follow without ESA for now. 5.  Metabolic bone disease: Corr Ca slightly high - holding VDRA. 6.  Wegener's granulomatosis: hx remote renal disease which resulted in ESRD on dialysis, and recent eye disease as above    P: 1. OK for dc from renal standpoint.       [facility] 12/18/2018, 10:50 AM  Recent Labs  Lab 12/17/18 0400 12/18/18 0422  NA 137 135  K 5.6* 4.7  CL 101 97*  CO2 21* 26  GLUCOSE 148* 136*  BUN 88* 49*  CREATININE 13.96* 7.68*  CALCIUM 9.8 8.7*  PHOS 4.1 4.4  ALBUMIN 2.5* 2.6*   Recent Labs  Lab 12/15/18 0823  AST 21  ALT 28  ALKPHOS 34*  BILITOT 0.7  PROT 6.0*   Recent Labs  Lab 12/15/18 0823 12/16/18 0326 12/17/18 0400  WBC 8.9 9.0 8.1  NEUTROABS 6.3  --   --   HGB 11.6* 10.9* 10.2*  HCT 36.0* 34.1* 30.9*  MCV 98.6 98.6 95.7  PLT 132* 124* 131*

## 2018-12-18 NOTE — Progress Notes (Unsigned)
CC: eye pain OD HPI: 53 yo M comes in with hx of Wegener's.  Pt had pain and swelling in right eye.  Pt could not iterate how long it had been there.  Pt states that since starting steroids his pain has improved.    VA: 20/60 OU IOP: 20 OU P: no RAPD EOM: normal OU VF: full OU  Exam LLL: normal OU CS: normal OU K: normal OU AC: normal OU L: 1+ NS OU  DFE: pt deferred  A/P: iritis likely diagnosis.  Pt needs outpatient follow up at [facility], [place], [place].  [phone].  Please schedule patient for uveitis workup with Dr. [name] upon discharge.

## 2018-12-18 NOTE — Progress Notes (Signed)
[facility] Progress Note  Subjective: no new c/o's. Seen by pulm for immunosuppression / Wegener's.   Vitals:   12/17/18 1255 12/17/18 1954 12/18/18 0358 12/18/18 0930  BP: 125/75 117/75 119/82 114/69  Pulse: 83 79 68 72  Resp:  16 18   Temp:  97.7 F (36.5 C) 97.7 F (36.5 C)   TempSrc:  Oral Oral   SpO2: 96% 96% 98% 96%  Weight:   76.7 kg   Height:        Inpatient medications: . calcitRIOL  1 mcg Oral Q T,Th,Sa-HD  . calcium acetate  1,334 mg Oral TID WC  . Chlorhexidine Gluconate Cloth  6 each Topical Q0600  . ciprofloxacin  1 drop Right Eye Q4H while awake  . cyclopentolate  3 drop Right Eye Once  . gabapentin  300 mg Oral BID  . heparin  5,000 Units Subcutaneous Q8H  . insulin aspart  0-5 Units Subcutaneous QHS  . insulin aspart  0-9 Units Subcutaneous TID WC  . insulin detemir  5 Units Subcutaneous QHS  . metoprolol succinate  50 mg Oral Daily  . pantoprazole  40 mg Oral Daily  . risperiDONE  0.25 mg Oral QHS  . traZODone  100 mg Oral QHS   . sodium chloride 250 mL (12/18/18 0945)  . methylPREDNISolone (SOLU-MEDROL) injection 500 mg (12/18/18 0946)   sodium chloride, acetaminophen **OR** acetaminophen, hydrALAZINE, ipratropium-albuterol, ondansetron **OR** ondansetron (ZOFRAN) IV, senna-docusate  Iron/TIBC/Ferritin/ %Sat    Component Value Date/Time   IRON 82 02/05/2013 1900   TIBC 162 (L) 02/05/2013 1900   FERRITIN 1,377 (H) 02/05/2013 1900   IRONPCTSAT 51 02/05/2013 1900    Exam: General: Well developed, well nourished, in no acute distress. Head/ENT: Normocephalic, R eye diffuse has completely resolved overnight , there is only remaining a slight conj injection Neck: JVD not elevated. Lungs: Clear bilaterally to auscultation without wheezes, rales, or rhonchi Heart: RRR with normal S1, S2 Abdomen: Soft, non-tender, non-distended with normoactive bowel sounds Musculoskeletal:  Strength and tone appear normal for age. Lower extremities:  No edema or ischemic changes, no open wounds. Neuro: Alert and oriented X 3. Moves all extremities spontaneously. Dialysis Access: R forearm AVF + bruit   [facility] [place] TTS  4h  400/800  77kg  2/2 bath  AVF RFA  Hep 7600  - calcitriol 1 ug tiw po       Assessment: 1. R eye pain/edema: much better, back to baseline probably on exam.  Seen by pulm who recommends IV bolus steroids through today and then prednisone 60/d until seen in office by CCM.  They suggest if eye disease reoccurs despite prior Rituxan and appropriate steroid dosing they would suggest referral to [facility].  2.  ESRD: on HD TTS.  HD sat.  3.  Hypertension/volume: BP controlled, no edema on exam.  4.  Anemia: Hgb 10.9 - follow without ESA for now. 5.  Metabolic bone disease: Corr Ca slightly high - holding VDRA. 6.  Wegener's granulomatosis: hx remote renal disease which resulted in ESRD on dialysis, and recent eye disease as above    P: 1. HD Sat am.       [facility] 12/18/2018, 11:35 AM  Recent Labs  Lab 12/17/18 0400 12/18/18 0422  NA 137 135  K 5.6* 4.7  CL 101 97*  CO2 21* 26  GLUCOSE 148* 136*  BUN 88* 49*  CREATININE 13.96* 7.68*  CALCIUM 9.8 8.7*  PHOS 4.1 4.4  ALBUMIN 2.5*  2.6*   Recent Labs  Lab 12/15/18 0823  AST 21  ALT 28  ALKPHOS 34*  BILITOT 0.7  PROT 6.0*   Recent Labs  Lab 12/15/18 0823 12/16/18 0326 12/17/18 0400  WBC 8.9 9.0 8.1  NEUTROABS 6.3  --   --   HGB 11.6* 10.9* 10.2*  HCT 36.0* 34.1* 30.9*  MCV 98.6 98.6 95.7  PLT 132* 124* 131*

## 2018-12-19 LAB — BASIC METABOLIC PANEL
Anion gap: 14 (ref 5–15)
BUN: 81 mg/dL — ABNORMAL HIGH (ref 6–20)
CO2: 24 mmol/L (ref 22–32)
Calcium: 8.4 mg/dL — ABNORMAL LOW (ref 8.9–10.3)
Chloride: 98 mmol/L (ref 98–111)
Creatinine, Ser: 10.34 mg/dL — ABNORMAL HIGH (ref 0.61–1.24)
GFR calc Af Amer: 6 mL/min — ABNORMAL LOW (ref >60)
GFR calc non Af Amer: 5 mL/min — ABNORMAL LOW (ref >60)
Glucose, Bld: 124 mg/dL — ABNORMAL HIGH (ref 70–99)
Potassium: 5.6 mmol/L — ABNORMAL HIGH (ref 3.5–5.1)
Sodium: 136 mmol/L (ref 135–145)

## 2018-12-19 LAB — CBC
HCT: 30.1 % — ABNORMAL LOW (ref 39.0–52.0)
Hemoglobin: 9.8 g/dL — ABNORMAL LOW (ref 13.0–17.0)
MCH: 31.2 pg (ref 26.0–34.0)
MCHC: 32.6 g/dL (ref 30.0–36.0)
MCV: 95.9 fL (ref 80.0–100.0)
Platelets: 127 10*3/uL — ABNORMAL LOW (ref 150–400)
RBC: 3.14 MIL/uL — ABNORMAL LOW (ref 4.22–5.81)
RDW: 15.1 % (ref 11.5–15.5)
WBC: 7.2 10*3/uL (ref 4.0–10.5)
nRBC: 0 % (ref 0.0–0.2)

## 2018-12-19 MED ORDER — HEPARIN SODIUM (PORCINE) 1000 UNIT/ML IJ SOLN
INTRAMUSCULAR | Status: AC
  Administered 2018-12-19: 7600 [IU] via INTRAVENOUS_CENTRAL
  Filled 2018-12-19: qty 8

## 2018-12-19 MED ORDER — PREDNISONE 10 MG PO TABS: 60.0000 mg | ORAL_TABLET | Freq: Every day | ORAL | 0 refills | Status: AC

## 2018-12-19 MED ORDER — CALCITRIOL 0.25 MCG PO CAPS
ORAL_CAPSULE | ORAL | Status: AC
  Filled 2018-12-19: qty 4

## 2018-12-19 MED ORDER — HEPARIN SODIUM (PORCINE) 1000 UNIT/ML DIALYSIS
7600.0000 [IU] | Freq: Once | INTRAMUSCULAR | Status: AC
  Administered 2018-12-19: 7600 [IU] via INTRAVENOUS_CENTRAL

## 2018-12-19 NOTE — Procedures (Signed)
   I was present at this dialysis session, have reviewed the session itself and made  appropriate changes Kelly Splinter MD [place] pager [phone]   12/19/2018, 12:51 PM

## 2018-12-19 NOTE — Discharge Instructions (Signed)
Please make an appointment for [facility] in 1 to 2 weeks below. [place], [place], [place].  [phone].  Please schedule patient for uveitis workup with Dr. [name] upon discharge.    Prednisone tablets What is this medicine? PREDNISONE (PRED ni sone) is a corticosteroid. It is commonly used to treat inflammation of the skin, joints, lungs, and other organs. Common conditions treated include asthma, allergies, and arthritis. It is also used for other conditions, such as blood disorders and diseases of the adrenal glands. This medicine may be used for other purposes; ask your health care provider or pharmacist if you have questions. COMMON BRAND NAME(S): Deltasone, Predone, Sterapred, Sterapred DS What should I tell my health care provider before I take this medicine? They need to know if you have any of these conditions: -Cushing's syndrome -diabetes -glaucoma -heart disease -high blood pressure -infection (especially a virus infection such as chickenpox, cold sores, or herpes) -kidney disease -liver disease -mental illness -myasthenia gravis -osteoporosis -seizures -stomach or intestine problems -thyroid disease -an unusual or allergic reaction to lactose, prednisone, other medicines, foods, dyes, or preservatives -pregnant or trying to get pregnant -breast-feeding How should I use this medicine? Take this medicine by mouth with a glass of water. Follow the directions on the prescription label. Take this medicine with food. If you are taking this medicine once a day, take it in the morning. Do not take more medicine than you are told to take. Do not suddenly stop taking your medicine because you may develop a severe reaction. Your doctor will tell you how much medicine to take. If your doctor wants you to stop the medicine, the dose may be slowly lowered over time to avoid any side effects. Talk to your pediatrician regarding the use of  this medicine in children. Special care may be needed. Overdosage: If you think you have taken too much of this medicine contact a poison control center or emergency room at once. NOTE: This medicine is only for you. Do not share this medicine with others. What if I miss a dose? If you miss a dose, take it as soon as you can. If it is almost time for your next dose, talk to your doctor or health care professional. You may need to miss a dose or take an extra dose. Do not take double or extra doses without advice. What may interact with this medicine? Do not take this medicine with any of the following medications: -metyrapone -mifepristone This medicine may also interact with the following medications: -aminoglutethimide -amphotericin B -aspirin and aspirin-like medicines -barbiturates -certain medicines for diabetes, like glipizide or glyburide -cholestyramine -cholinesterase inhibitors -cyclosporine -digoxin -diuretics -ephedrine -male hormones, like estrogens and birth control pills -isoniazid -ketoconazole -NSAIDS, medicines for pain and inflammation, like ibuprofen or naproxen -phenytoin -rifampin -toxoids -vaccines -warfarin This list may not describe all possible interactions. Give your health care provider a list of all the medicines, herbs, non-prescription drugs, or dietary supplements you use. Also tell them if you smoke, drink alcohol, or use illegal drugs. Some items may interact with your medicine. What should I watch for while using this medicine? Visit your doctor or health care professional for regular checks on your progress. If you are taking this medicine over a prolonged period, carry an identification card with your name and address, the type and dose of your medicine, and your doctor's name and address. This medicine may increase your risk of getting an infection. Tell your doctor or health  care professional if you are around anyone with measles or  chickenpox, or if you develop sores or blisters that do not heal properly. If you are going to have surgery, tell your doctor or health care professional that you have taken this medicine within the last twelve months. Ask your doctor or health care professional about your diet. You may need to lower the amount of salt you eat. This medicine may increase blood sugar. Ask your healthcare provider if changes in diet or medicines are needed if you have diabetes. What side effects may I notice from receiving this medicine? Side effects that you should report to your doctor or health care professional as soon as possible: -allergic reactions like skin rash, itching or hives, swelling of the face, lips, or tongue -changes in emotions or moods -changes in vision -depressed mood -eye pain -fever or chills, cough, sore throat, pain or difficulty passing urine -signs and symptoms of high blood sugar such as being more thirsty or hungry or having to urinate more than normal. You may also feel very tired or have blurry vision. -swelling of ankles, feet Side effects that usually do not require medical attention (report to your doctor or health care professional if they continue or are bothersome): -confusion, excitement, restlessness -headache -nausea, vomiting -skin problems, acne, thin and shiny skin -trouble sleeping -weight gain This list may not describe all possible side effects. Call your doctor for medical advice about side effects. You may report side effects to FDA at 1-800-FDA-1088. Where should I keep my medicine? Keep out of the reach of children. Store at room temperature between 15 and 30 degrees C (59 and 86 degrees F). Protect from light. Keep container tightly closed. Throw away any unused medicine after the expiration date. NOTE: This sheet is a summary. It may not cover all possible information. If you have questions about this medicine, talk to your doctor, pharmacist, or health care  provider.  2019 Elsevier/Gold Standard (2018-07-14 10:54:22)

## 2018-12-19 NOTE — Discharge Summary (Signed)
Physician Discharge Summary  James Nicholson VOH:[id number] DOB: [birth year] DOA: 12/15/2018  PCP: [name], MD  Admit date: 12/15/2018 Discharge date: 12/19/2018  Admitted From: Group home Disposition: Group home  Recommendations for Outpatient Follow-up:  1. Follow up with PCP in 1-2 weeks 2. Please obtain BMP/CBC in one week 3. Please take your steroids as prescribed until you see the lung doctors 4. Please follow-up with the lung doctors in 2 weeks 5. Please follow-up with the eye doctors in 1 to 2 weeks  Home Health: No Equipment/Devices: None  Discharge Condition: Stable CODE STATUS: Full Diet recommendation: Renal/diabetic diet  Brief/Interim Summary:  #) Wegener's granulomatosis with ocular pseudotumor: Patient was admitted with increasing protuberance of eye with CT findings concerning for ocular pseudotumor.  Patient was transferred from [facility] to [facility] for ophthalmology evaluation.  Patient was seen by ophthalmology who recommended no treatment except for immunosuppression while inpatient but will follow-up as an outpatient.  Patient's family was given information for eye doctor appointment.  Pulmonology was consulted for immunosuppressive regimen and they recommended 3 doses of IV methylprednisolone 500 mg followed by prednisone 60 mg daily until patient followed up outpatient with pulmonology.  It was felt the patient's recurrence of ocular pseudotumor was in the setting of noncompliance with prolonged steroid taper that was prescribed.  #) ESRD on Tuesday/Thursday/Saturday dialysis: Nephrology was consulted and patient was continued on regular dialysis regimen.  He was continued on calcitriol and calcium acetate with meals.  #) VT arrest status post AICD/chronic systolic and grade 1 diastolic heart failure: Patient's volume status was managed with dialysis.  Was continued on beta-blocker.  #) Hypertension: Patient was continued on metoprolol succinate.  #)  Pain/psych: Patient presented from group home.  He has a history of schizophrenia and possible intellectual disability.  He was continued on gabapentin and risperidone.  #) Type 2 diabetes: Patient was continued on Levemir 5 units nightly.  He was maintained on sliding scale insulin before meals at bedtime here.  Discharge Diagnoses:  Principal Problem:   Eye pain, right Active Problems:   Hypertension   Automatic implantable cardioverter-defibrillator in situ   Anemia in chronic kidney disease   ESRD on dialysis (HCC)   Wegener's granulomatosis ([place])   Type II diabetes mellitus with renal manifestations (HCC)   GERD (gastroesophageal reflux disease)   Eye pain    Discharge Instructions  Discharge Instructions    Ambulatory referral to Pulmonology   Complete by:  As directed    2 weeks with Dr. [name]   Call MD for:  difficulty breathing, headache or visual disturbances   Complete by:  As directed    Call MD for:  extreme fatigue   Complete by:  As directed    Call MD for:  hives   Complete by:  As directed    Call MD for:  persistant dizziness or light-headedness   Complete by:  As directed    Call MD for:  persistant nausea and vomiting   Complete by:  As directed    Call MD for:  redness, tenderness, or signs of infection (pain, swelling, redness, odor or green/yellow discharge around incision site)   Complete by:  As directed    Call MD for:  severe uncontrolled pain   Complete by:  As directed    Call MD for:  temperature >100.4   Complete by:  As directed    Diet - low sodium heart healthy   Complete by:  As directed  Discharge instructions   Complete by:  As directed    Please take your steroids as prescribed.  Please follow-up with the eye doctor in 1 to 2 weeks.  Please follow-up with the lung doctor in 2 weeks.   Increase activity slowly   Complete by:  As directed      Allergies as of 12/19/2018   No Known Allergies     Medication List    TAKE  these medications   calcitRIOL 0.5 MCG capsule Commonly known as:  ROCALTROL Take 2 capsules (1 mcg total) by mouth every Monday, Wednesday, and Friday with hemodialysis.   calcium acetate 667 MG capsule Commonly known as:  PHOSLO Take 2 capsules (1,334 mg total) by mouth 3 (three) times daily with meals. 8a, 12p, 5p   gabapentin 300 MG capsule Commonly known as:  NEURONTIN Take 1 capsule (300 mg total) by mouth 2 (two) times daily.   insulin aspart 100 UNIT/ML FlexPen Commonly known as:  NOVOLOG FLEXPEN Inject 0-5 Units into the skin 3 (three) times daily with meals. 201-250 = 2 units, 251-300 = 3 units, 301-350 = 4 units, 351-400 = 5 units   Insulin Detemir 100 UNIT/ML Pen Commonly known as:  LEVEMIR Inject 5 Units into the skin at bedtime.   Insulin Pen Needle 32G X 5 MM Misc 1 each by Does not apply route daily as needed.   ipratropium-albuterol 0.5-2.5 (3) MG/3ML Soln Commonly known as:  DUONEB Take 3 mLs by nebulization every 4 (four) hours as needed for up to 30 days.   metoprolol succinate 50 MG 24 hr tablet Commonly known as:  TOPROL-XL Take 50 mg by mouth daily.   omeprazole 20 MG capsule Commonly known as:  PRILOSEC Take 20 mg by mouth every morning.   predniSONE 10 MG tablet Commonly known as:  DELTASONE Take 6 tablets (60 mg total) by mouth daily for 30 days.   risperiDONE 0.25 MG tablet Commonly known as:  RISPERDAL Take 1 tablet (0.25 mg total) by mouth at bedtime.   traZODone 100 MG tablet Commonly known as:  DESYREL Take 1 tablet (100 mg total) by mouth at bedtime.       No Known Allergies  Consultations:  Nephrology  Pulmonology  Ophthalmology   Procedures/Studies: Ct Orbits Wo Contrast  Result Date: 12/15/2018 CLINICAL DATA:  History of Wegner's granulomatosis. Recurrent right eye swelling and proptosis. EXAM: CT ORBITS WITHOUT CONTRAST TECHNIQUE: Multidetector CT images were obtained using the standard protocol without intravenous  contrast. COMPARISON:  10/08/2018 FINDINGS: Orbits: Regression of ill-defined lobulated mass in the intra and extraconal right orbit, currently 25 x 22 mm on axial slices as compared to 35 x 26 mm. There is still proptosis on the right. Stable bony dehiscence in the medial and anterior right orbital wall. Visualized sinuses: Mild mucosal thickening, greatest in the frontal sinuses, that has improved since prior. Chronic defect in the right anterior uncinate process and right middle turbinate. Resolved left mastoid and middle ear opacification. Soft tissues: No evidence of acute inflammation. Limited intracranial: Negative IMPRESSION: 1. History of Wegner's disease with right orbital granuloma that has regressed since 10/08/2018 comparison. The mass currently measures up to 2.5 cm and continues to cause proptosis. 2. Improved sinusitis and resolved left mastoid opacification. Electronically Signed   By: Monte Fantasia M.D.   On: 12/15/2018 09:07    Dilated eye exam to 20 2020VA: 20/60 OU IOP: 20 OU P: no RAPD EOM: normal OU VF: full OU  Exam LLL:  normal OU CS: normal OU K: normal OU AC: normal OU L: 1+ NS OU  DFE: pt deferred   Subjective:   Discharge Exam: Vitals:   12/19/18 0845 12/19/18 0915  BP: 124/68 113/64  Pulse: (!) 50 (!) 51  Resp:    Temp:    SpO2:     Vitals:   12/19/18 0800 12/19/18 0815 12/19/18 0845 12/19/18 0915  BP: 119/64 120/67 124/68 113/64  Pulse: (!) 58 (!) 52 (!) 50 (!) 51  Resp:      Temp:      TempSrc:      SpO2:      Weight:      Height:       General exam: Appears calm and comfortable  Respiratory system: Clear to auscultation. Respiratory effort normal.  Scattered rhonchi Cardiovascular system: Regular rate and rhythm, 2 out of 6 systolic murmur transmitted from fistula Gastrointestinal system: Abdomen is nondistended, soft and nontender. No organomegaly or masses felt. Normal bowel sounds heard. Central nervous system: Alert and oriented.   Ecchymotic right eye, mildly proptotic, no pain with eye movements. Extremities: No lower extremity edema, fistula site with palpable thrill and audible bruit Skin: No rashes over visible skin Psychiatry: Judgement and insight appear normal. Mood & affect appropriate.  The results of significant diagnostics from this hospitalization (including imaging, microbiology, ancillary and laboratory) are listed below for reference.     Microbiology: Recent Results (from the past 240 hour(s))  MRSA PCR Screening     Status: None   Collection Time: 12/16/18 10:30 PM  Result Value Ref Range Status   MRSA by PCR NEGATIVE NEGATIVE Final    Comment:        The GeneXpert MRSA Assay (FDA approved for NASAL specimens only), is one component of a comprehensive MRSA colonization surveillance program. It is not intended to diagnose MRSA infection nor to guide or monitor treatment for MRSA infections. Performed at [facility], [place] [address] [place], [place] [address]      Labs: BNP (last 3 results) No results for input(s): BNP in the last 8760 hours. Basic Metabolic Panel: Recent Labs  Lab 12/15/18 0823 12/16/18 0326 12/17/18 0400 12/18/18 0422 12/19/18 0429  NA 141 140 137 135 136  K 4.1 4.9 5.6* 4.7 5.6*  CL 105 105 101 97* 98  CO2 28 24 21* 26 24  GLUCOSE 86 94 148* 136* 124*  BUN 52* 60* 88* 49* 81*  CREATININE 10.03* 11.97* 13.96* 7.68* 10.34*  CALCIUM 10.3 9.6 9.8 8.7* 8.4*  PHOS  --   --  4.1 4.4  --    Liver Function Tests: Recent Labs  Lab 12/15/18 0823 12/17/18 0400 12/18/18 0422  AST 21  --   --   ALT 28  --   --   ALKPHOS 34*  --   --   BILITOT 0.7  --   --   PROT 6.0*  --   --   ALBUMIN 3.3* 2.5* 2.6*   No results for input(s): LIPASE, AMYLASE in the last 168 hours. No results for input(s): AMMONIA in the last 168 hours. CBC: Recent Labs  Lab 12/15/18 0823 12/16/18 0326 12/17/18 0400 12/19/18 0429  WBC 8.9 9.0 8.1 7.2  NEUTROABS 6.3  --   --   --    HGB 11.6* 10.9* 10.2* 9.8*  HCT 36.0* 34.1* 30.9* 30.1*  MCV 98.6 98.6 95.7 95.9  PLT 132* 124* 131* 127*   Cardiac Enzymes: No results for  input(s): CKTOTAL, CKMB, CKMBINDEX, TROPONINI in the last 168 hours. BNP: Invalid input(s): POCBNP CBG: Recent Labs  Lab 12/17/18 2119 12/18/18 0741 12/18/18 1143 12/18/18 1607 12/18/18 2110  GLUCAP 116* 116* 151* 202* 122*   D-Dimer No results for input(s): DDIMER in the last 72 hours. Hgb A1c No results for input(s): HGBA1C in the last 72 hours. Lipid Profile No results for input(s): CHOL, HDL, LDLCALC, TRIG, CHOLHDL, LDLDIRECT in the last 72 hours. Thyroid function studies No results for input(s): TSH, T4TOTAL, T3FREE, THYROIDAB in the last 72 hours.  Invalid input(s): FREET3 Anemia work up No results for input(s): VITAMINB12, FOLATE, FERRITIN, TIBC, IRON, RETICCTPCT in the last 72 hours. Urinalysis    Component Value Date/Time   COLORURINE RED (A) 07/06/2016 1938   APPEARANCEUR TURBID (A) 07/06/2016 1938   LABSPEC 1.020 07/06/2016 1938   PHURINE 7.5 07/06/2016 1938   GLUCOSEU NEGATIVE 07/06/2016 1938   HGBUR LARGE (A) 07/06/2016 1938   BILIRUBINUR NEGATIVE 07/06/2016 1938   KETONESUR NEGATIVE 07/06/2016 1938   PROTEINUR >300 (A) 07/06/2016 1938   UROBILINOGEN 0.2 11/04/2014 0952   NITRITE NEGATIVE 07/06/2016 1938   LEUKOCYTESUR SMALL (A) 07/06/2016 1938   Sepsis Labs Invalid input(s): PROCALCITONIN,  WBC,  LACTICIDVEN Microbiology Recent Results (from the past 240 hour(s))  MRSA PCR Screening     Status: None   Collection Time: 12/16/18 10:30 PM  Result Value Ref Range Status   MRSA by PCR NEGATIVE NEGATIVE Final    Comment:        The GeneXpert MRSA Assay (FDA approved for NASAL specimens only), is one component of a comprehensive MRSA colonization surveillance program. It is not intended to diagnose MRSA infection nor to guide or monitor treatment for MRSA infections. Performed at [facility],  [place] [address], [place], [place] [address]      Time coordinating discharge: 57  SIGNED:   Cristy Folks, MD  [facility] 12/19/2018, 10:52 AM   If 7PM-7AM, please contact night-coverage [email or web address] Password [email or web address]

## 2018-12-22 DIAGNOSIS — N186 End stage renal disease: Secondary | ICD-10-CM | POA: Diagnosis not present

## 2018-12-22 DIAGNOSIS — D509 Iron deficiency anemia, unspecified: Secondary | ICD-10-CM | POA: Diagnosis not present

## 2018-12-22 DIAGNOSIS — Z23 Encounter for immunization: Secondary | ICD-10-CM | POA: Diagnosis not present

## 2018-12-22 DIAGNOSIS — N2581 Secondary hyperparathyroidism of renal origin: Secondary | ICD-10-CM | POA: Diagnosis not present

## 2018-12-22 DIAGNOSIS — E1129 Type 2 diabetes mellitus with other diabetic kidney complication: Secondary | ICD-10-CM | POA: Diagnosis not present

## 2018-12-23 DIAGNOSIS — Z95810 Presence of automatic (implantable) cardiac defibrillator: Secondary | ICD-10-CM | POA: Diagnosis not present

## 2018-12-23 DIAGNOSIS — Z0001 Encounter for general adult medical examination with abnormal findings: Secondary | ICD-10-CM | POA: Diagnosis not present

## 2018-12-23 DIAGNOSIS — Z7189 Other specified counseling: Secondary | ICD-10-CM | POA: Diagnosis not present

## 2018-12-23 DIAGNOSIS — R5383 Other fatigue: Secondary | ICD-10-CM | POA: Diagnosis not present

## 2018-12-23 DIAGNOSIS — F0390 Unspecified dementia without behavioral disturbance: Secondary | ICD-10-CM | POA: Diagnosis not present

## 2018-12-23 DIAGNOSIS — E785 Hyperlipidemia, unspecified: Secondary | ICD-10-CM | POA: Diagnosis not present

## 2018-12-23 DIAGNOSIS — E559 Vitamin D deficiency, unspecified: Secondary | ICD-10-CM | POA: Diagnosis not present

## 2018-12-23 DIAGNOSIS — F203 Undifferentiated schizophrenia: Secondary | ICD-10-CM | POA: Diagnosis not present

## 2018-12-23 DIAGNOSIS — N186 End stage renal disease: Secondary | ICD-10-CM | POA: Diagnosis not present

## 2018-12-23 DIAGNOSIS — Z992 Dependence on renal dialysis: Secondary | ICD-10-CM | POA: Diagnosis not present

## 2018-12-23 DIAGNOSIS — Z1389 Encounter for screening for other disorder: Secondary | ICD-10-CM | POA: Diagnosis not present

## 2018-12-23 DIAGNOSIS — I4901 Ventricular fibrillation: Secondary | ICD-10-CM | POA: Diagnosis not present

## 2018-12-24 DIAGNOSIS — D509 Iron deficiency anemia, unspecified: Secondary | ICD-10-CM | POA: Diagnosis not present

## 2018-12-24 DIAGNOSIS — Z23 Encounter for immunization: Secondary | ICD-10-CM | POA: Diagnosis not present

## 2018-12-24 DIAGNOSIS — N186 End stage renal disease: Secondary | ICD-10-CM | POA: Diagnosis not present

## 2018-12-24 DIAGNOSIS — N2581 Secondary hyperparathyroidism of renal origin: Secondary | ICD-10-CM | POA: Diagnosis not present

## 2018-12-24 DIAGNOSIS — E1129 Type 2 diabetes mellitus with other diabetic kidney complication: Secondary | ICD-10-CM | POA: Diagnosis not present

## 2018-12-26 DIAGNOSIS — E1129 Type 2 diabetes mellitus with other diabetic kidney complication: Secondary | ICD-10-CM | POA: Diagnosis not present

## 2018-12-26 DIAGNOSIS — N2581 Secondary hyperparathyroidism of renal origin: Secondary | ICD-10-CM | POA: Diagnosis not present

## 2018-12-26 DIAGNOSIS — D509 Iron deficiency anemia, unspecified: Secondary | ICD-10-CM | POA: Diagnosis not present

## 2018-12-26 DIAGNOSIS — N186 End stage renal disease: Secondary | ICD-10-CM | POA: Diagnosis not present

## 2018-12-26 DIAGNOSIS — Z23 Encounter for immunization: Secondary | ICD-10-CM | POA: Diagnosis not present

## 2018-12-27 DIAGNOSIS — N032 Chronic nephritic syndrome with diffuse membranous glomerulonephritis: Secondary | ICD-10-CM | POA: Diagnosis not present

## 2018-12-27 DIAGNOSIS — Z992 Dependence on renal dialysis: Secondary | ICD-10-CM | POA: Diagnosis not present

## 2018-12-27 DIAGNOSIS — N186 End stage renal disease: Secondary | ICD-10-CM | POA: Diagnosis not present

## 2018-12-29 DIAGNOSIS — N19 Unspecified kidney failure: Secondary | ICD-10-CM | POA: Diagnosis not present

## 2018-12-29 DIAGNOSIS — N2581 Secondary hyperparathyroidism of renal origin: Secondary | ICD-10-CM | POA: Diagnosis not present

## 2018-12-29 DIAGNOSIS — Z95810 Presence of automatic (implantable) cardiac defibrillator: Secondary | ICD-10-CM | POA: Diagnosis not present

## 2018-12-29 DIAGNOSIS — R5383 Other fatigue: Secondary | ICD-10-CM | POA: Diagnosis not present

## 2018-12-29 DIAGNOSIS — N400 Enlarged prostate without lower urinary tract symptoms: Secondary | ICD-10-CM | POA: Diagnosis not present

## 2018-12-29 DIAGNOSIS — N186 End stage renal disease: Secondary | ICD-10-CM | POA: Diagnosis not present

## 2018-12-29 DIAGNOSIS — Z992 Dependence on renal dialysis: Secondary | ICD-10-CM | POA: Diagnosis not present

## 2018-12-29 DIAGNOSIS — N289 Disorder of kidney and ureter, unspecified: Secondary | ICD-10-CM | POA: Diagnosis not present

## 2018-12-29 DIAGNOSIS — D631 Anemia in chronic kidney disease: Secondary | ICD-10-CM | POA: Diagnosis not present

## 2018-12-29 DIAGNOSIS — R739 Hyperglycemia, unspecified: Secondary | ICD-10-CM | POA: Diagnosis not present

## 2018-12-29 DIAGNOSIS — I4901 Ventricular fibrillation: Secondary | ICD-10-CM | POA: Diagnosis not present

## 2018-12-29 DIAGNOSIS — E1129 Type 2 diabetes mellitus with other diabetic kidney complication: Secondary | ICD-10-CM | POA: Diagnosis not present

## 2018-12-29 DIAGNOSIS — D509 Iron deficiency anemia, unspecified: Secondary | ICD-10-CM | POA: Diagnosis not present

## 2018-12-29 DIAGNOSIS — R946 Abnormal results of thyroid function studies: Secondary | ICD-10-CM | POA: Diagnosis not present

## 2018-12-29 DIAGNOSIS — F0390 Unspecified dementia without behavioral disturbance: Secondary | ICD-10-CM | POA: Diagnosis not present

## 2018-12-29 DIAGNOSIS — Z23 Encounter for immunization: Secondary | ICD-10-CM | POA: Diagnosis not present

## 2018-12-29 DIAGNOSIS — E7800 Pure hypercholesterolemia, unspecified: Secondary | ICD-10-CM | POA: Diagnosis not present

## 2018-12-31 DIAGNOSIS — Z23 Encounter for immunization: Secondary | ICD-10-CM | POA: Diagnosis not present

## 2018-12-31 DIAGNOSIS — D509 Iron deficiency anemia, unspecified: Secondary | ICD-10-CM | POA: Diagnosis not present

## 2018-12-31 DIAGNOSIS — D631 Anemia in chronic kidney disease: Secondary | ICD-10-CM | POA: Diagnosis not present

## 2018-12-31 DIAGNOSIS — N186 End stage renal disease: Secondary | ICD-10-CM | POA: Diagnosis not present

## 2018-12-31 DIAGNOSIS — N2581 Secondary hyperparathyroidism of renal origin: Secondary | ICD-10-CM | POA: Diagnosis not present

## 2018-12-31 DIAGNOSIS — E1129 Type 2 diabetes mellitus with other diabetic kidney complication: Secondary | ICD-10-CM | POA: Diagnosis not present

## 2019-01-02 DIAGNOSIS — N2581 Secondary hyperparathyroidism of renal origin: Secondary | ICD-10-CM | POA: Diagnosis not present

## 2019-01-02 DIAGNOSIS — E1129 Type 2 diabetes mellitus with other diabetic kidney complication: Secondary | ICD-10-CM | POA: Diagnosis not present

## 2019-01-02 DIAGNOSIS — Z23 Encounter for immunization: Secondary | ICD-10-CM | POA: Diagnosis not present

## 2019-01-02 DIAGNOSIS — D509 Iron deficiency anemia, unspecified: Secondary | ICD-10-CM | POA: Diagnosis not present

## 2019-01-02 DIAGNOSIS — N186 End stage renal disease: Secondary | ICD-10-CM | POA: Diagnosis not present

## 2019-01-02 DIAGNOSIS — D631 Anemia in chronic kidney disease: Secondary | ICD-10-CM | POA: Diagnosis not present

## 2019-01-04 DIAGNOSIS — I871 Compression of vein: Secondary | ICD-10-CM | POA: Diagnosis not present

## 2019-01-04 DIAGNOSIS — Z992 Dependence on renal dialysis: Secondary | ICD-10-CM | POA: Diagnosis not present

## 2019-01-04 DIAGNOSIS — N186 End stage renal disease: Secondary | ICD-10-CM | POA: Diagnosis not present

## 2019-01-05 ENCOUNTER — Encounter: Payer: Self-pay | Admitting: Emergency Medicine

## 2019-01-05 ENCOUNTER — Emergency Department
Admission: EM | Admit: 2019-01-05 | Discharge: 2019-01-05 | Disposition: A | Discharge status: Home / Self Care | Payer: Medicare Other | Attending: Emergency Medicine | Admitting: Emergency Medicine

## 2019-01-05 DIAGNOSIS — I120 Hypertensive chronic kidney disease with stage 5 chronic kidney disease or end stage renal disease: Secondary | ICD-10-CM | POA: Insufficient documentation

## 2019-01-05 DIAGNOSIS — N185 Chronic kidney disease, stage 5: Secondary | ICD-10-CM | POA: Diagnosis not present

## 2019-01-05 DIAGNOSIS — H05111 Granuloma of right orbit: Secondary | ICD-10-CM | POA: Insufficient documentation

## 2019-01-05 DIAGNOSIS — H9201 Otalgia, right ear: Secondary | ICD-10-CM | POA: Diagnosis not present

## 2019-01-05 DIAGNOSIS — I4891 Unspecified atrial fibrillation: Secondary | ICD-10-CM | POA: Diagnosis not present

## 2019-01-05 DIAGNOSIS — H579 Unspecified disorder of eye and adnexa: Secondary | ICD-10-CM | POA: Diagnosis not present

## 2019-01-05 DIAGNOSIS — I10 Essential (primary) hypertension: Secondary | ICD-10-CM | POA: Diagnosis not present

## 2019-01-05 DIAGNOSIS — H5711 Ocular pain, right eye: Secondary | ICD-10-CM | POA: Diagnosis not present

## 2019-01-05 DIAGNOSIS — E119 Type 2 diabetes mellitus without complications: Secondary | ICD-10-CM | POA: Insufficient documentation

## 2019-01-05 LAB — CBC WITH DIFFERENTIAL/PLATELET
Abs Immature Granulocytes: 0 10*3/uL (ref 0.00–0.07)
Basophils Absolute: 0 10*3/uL (ref 0.0–0.1)
Basophils Relative: 0 %
Eosinophils Absolute: 0 10*3/uL (ref 0.0–0.5)
Eosinophils Relative: 0 %
HCT: 31.1 % — ABNORMAL LOW (ref 39.0–52.0)
Hemoglobin: 10.5 g/dL — ABNORMAL LOW (ref 13.0–17.0)
Lymphocytes Relative: 29 %
Lymphs Abs: 3.4 10*3/uL (ref 0.7–4.0)
MCH: 32.1 pg (ref 26.0–34.0)
MCHC: 33.8 g/dL (ref 30.0–36.0)
MCV: 95.1 fL (ref 80.0–100.0)
Monocytes Absolute: 1.2 10*3/uL — ABNORMAL HIGH (ref 0.1–1.0)
Monocytes Relative: 10 %
Neutro Abs: 7.1 10*3/uL (ref 1.7–7.7)
Neutrophils Relative %: 61 %
Platelets: 138 10*3/uL — ABNORMAL LOW (ref 150–400)
RBC: 3.27 MIL/uL — ABNORMAL LOW (ref 4.22–5.81)
RDW: 15.9 % — ABNORMAL HIGH (ref 11.5–15.5)
Smear Review: NORMAL
WBC: 11.6 10*3/uL — ABNORMAL HIGH (ref 4.0–10.5)
nRBC: 0 % (ref 0.0–0.2)

## 2019-01-05 LAB — COMPREHENSIVE METABOLIC PANEL
ALT: 18 U/L (ref 0–44)
AST: 16 U/L (ref 15–41)
Albumin: 3.4 g/dL — ABNORMAL LOW (ref 3.5–5.0)
Alkaline Phosphatase: 31 U/L — ABNORMAL LOW (ref 38–126)
Anion gap: 11 (ref 5–15)
BUN: 56 mg/dL — ABNORMAL HIGH (ref 6–20)
CO2: 28 mmol/L (ref 22–32)
Calcium: 9.6 mg/dL (ref 8.9–10.3)
Chloride: 95 mmol/L — ABNORMAL LOW (ref 98–111)
Creatinine, Ser: 8.28 mg/dL — ABNORMAL HIGH (ref 0.61–1.24)
GFR calc Af Amer: 8 mL/min — ABNORMAL LOW (ref >60)
GFR calc non Af Amer: 7 mL/min — ABNORMAL LOW (ref >60)
Glucose, Bld: 84 mg/dL (ref 70–99)
Potassium: 4.5 mmol/L (ref 3.5–5.1)
Sodium: 134 mmol/L — ABNORMAL LOW (ref 135–145)
Total Bilirubin: 1 mg/dL (ref 0.3–1.2)
Total Protein: 5.3 g/dL — ABNORMAL LOW (ref 6.5–8.1)

## 2019-01-05 MED ORDER — PREDNISONE 20 MG PO TABS
60.0000 mg | ORAL_TABLET | Freq: Once | ORAL | Status: AC
  Administered 2019-01-05: 60 mg via ORAL
  Filled 2019-01-05: qty 3

## 2019-01-05 MED ORDER — OXYCODONE-ACETAMINOPHEN 5-325 MG PO TABS: 1.0000 | ORAL_TABLET | Freq: Three times a day (TID) | ORAL | 0 refills | Status: DC | PRN

## 2019-01-05 MED ORDER — PREDNISONE 20 MG PO TABS: 60.0000 mg | ORAL_TABLET | Freq: Every day | ORAL | 0 refills | Status: AC

## 2019-01-05 MED ORDER — OXYCODONE-ACETAMINOPHEN 5-325 MG PO TABS
2.0000 | ORAL_TABLET | Freq: Once | ORAL | Status: AC
  Administered 2019-01-05: 2 via ORAL
  Filled 2019-01-05: qty 2

## 2019-01-05 MED ORDER — FLUORESCEIN SODIUM 1 MG OP STRP
1.0000 | ORAL_STRIP | Freq: Once | OPHTHALMIC | Status: AC
  Administered 2019-01-05: 1 via OPHTHALMIC
  Filled 2019-01-05: qty 1

## 2019-01-05 MED ORDER — TETRACAINE HCL 0.5 % OP SOLN
2.0000 [drp] | Freq: Once | OPHTHALMIC | Status: AC
  Administered 2019-01-05: 2 [drp] via OPHTHALMIC
  Filled 2019-01-05: qty 4

## 2019-01-05 NOTE — ED Provider Notes (Signed)
[facility] Emergency Department Provider Note       Time seen: ----------------------------------------- 11:05 AM on 01/05/2019 -----------------------------------------   I have reviewed the triage vital signs and the nursing notes.  HISTORY   Chief Complaint Eye Pain   HPI James Nicholson is a 53 y.o. male with a history of anemia, atrial fibrillation, chronic kidney disease, diabetes, schizophrenia, Wegener's granulomatosis who presents to the ED for right eye pain.  Patient presents from a group home complaining of eye pain and swelling.  He has a known history of right eye problems.  He speaks [place].  Patient noted to have edema and redness to the right eye on arrival.  Past Medical History:  Diagnosis Date  . Anemia   . Atrial fibrillation   . Cardiomyopathy ([place]) 2010   Unclear Etiology: Last Echo 06/2009: EF 40-45%, severe Lateral & apical Hypokinesis (? CAD) ; Grade 2 DDysfxn. Mild conc LVH.   Marland Kitchen Cellulitis   . Chronic kidney disease (CKD), stage V ([place])    Dialysis Tue, Thurs, Sat  . Dyslipidemia   . Enterobacter sepsis ([place])   . Hypertension   . IDDM (insulin dependent diabetes mellitus) ([place]) 11/03/2014  . S/P ICD (internal cardiac defibrillator) procedure 2010   VT Arrest (in [place])  . Schizophrenia ([place])   . Wegener's disease, pulmonary ([place]) 02/05/2013  . Wegener's granulomatosis [facility])     Patient Active Problem List   Diagnosis Date Noted  . Eye pain 12/16/2018  . Wegener's granulomatosis ([place]) 12/15/2018  . Eye pain, right 12/15/2018  . Type II diabetes mellitus with renal manifestations ([place]) 12/15/2018  . GERD (gastroesophageal reflux disease) 12/15/2018  . Orbital mass 10/08/2018  . Sinusitis 10/08/2018  . Redness of right eye 10/08/2018  . Ocular proptosis 10/08/2018  . Pulmonary nodule, left 10/08/2018  . Cough   . Lung nodules--left upper lobe and left posterior lobe- need to rule out malignancy 10/05/2018  .  Conjunctivitis 09/27/2018  . Chronic kidney disease   . Hyperlipidemia   . Sepsis ([place]) 11/03/2014  . IDDM (insulin dependent diabetes mellitus) ([place]) 11/03/2014  . Troponin level elevated 12/22/2013    Class: Acute  . Mechanical complication of other vascular device, implant, and graft 08/04/2013  . Other complications due to renal dialysis device, implant, and graft 08/04/2013  . ESRD on dialysis ([place]) 04/13/2013  . Anemia in chronic kidney disease 02/05/2013  . Pneumonia 02/05/2013  . GI bleed 02/05/2013  . Metabolic acidosis 96/28/3662  . Wegener's disease, pulmonary ([place]) 02/05/2013  . PPD positive - completed 9 months INH (per Dr. Linus Salmons) 05/25/2012  . Other primary cardiomyopathies 05/07/2012  . H/o Ventricular fibrillation Arrest now with AICD 05/07/2012    Class: History of  . Hypertension 05/07/2012  . Automatic implantable cardioverter-defibrillator in situ 05/07/2012    Past Surgical History:  Procedure Laterality Date  . AV FISTULA PLACEMENT Right 02/10/2013   Procedure: ARTERIOVENOUS (AV) FISTULA CREATION;  Surgeon: Rosetta Posner, MD;  [facility] OR;  Service: Vascular;  Laterality: Right;  Right forearm radial/cephalic arterovenous fistula.   Marland Kitchen CARDIAC CATHETERIZATION  2010   [place]: In setting of VT arrest. Per brother's report, nonobstructive with no intervention  . CARDIAC DEFIBRILLATOR PLACEMENT  2010   [place]  . FISTULOGRAM Right 08/09/2013   Procedure: FISTULOGRAM;  Surgeon: Angelia Mould, MD;  Location: [facility] CATH LAB;  Service: Cardiovascular;  Laterality: Right;  . ICD GENERATOR CHANGEOUT N/A 12/05/2017   Procedure: ICD GENERATOR CHANGEOUT;  Surgeon: Lovena Le,  Champ Mungo, MD;  Location: [facility] CV LAB;  Service: Cardiovascular;  Laterality: N/A;  . LIGATION OF COMPETING BRANCHES OF ARTERIOVENOUS FISTULA Right 08/13/2013   Procedure: LIGATION OF COMPETING BRANCHES X5 OF ARTERIOVENOUS FISTULA- RIGHT ARM;  Surgeon: Serafina Mitchell, MD;  Location: [facility];   Service: Vascular;  Laterality: Right;    Allergies Patient has no known allergies.  Social History Social History   Tobacco Use  . Smoking status: Former Smoker    Last attempt to quit: 10/28/2008    Years since quitting: 10.1  . Smokeless tobacco: Never Used  Substance Use Topics  . Alcohol use: No  . Drug use: No    Review of Systems Constitutional: Negative for fever. Eyes: Positive for right eye pain and swelling Cardiovascular: Negative for chest pain. Respiratory: Negative for shortness of breath. Genitourinary: Negative for dysuria. Musculoskeletal: Negative for back pain. Skin: Negative for rash. Neurological: Negative for headaches, focal weakness or numbness.  All systems negative/normal/unremarkable except as stated in the HPI  ____________________________________________   PHYSICAL EXAM:  VITAL SIGNS: ED Triage Vitals  Enc Vitals Group     BP 01/05/19 0905 (!) 144/73     Pulse Rate 01/05/19 0905 71     Resp 01/05/19 0905 18     Temp 01/05/19 0905 (!) 97.5 F (36.4 C)     Temp Source 01/05/19 0905 Oral     SpO2 01/05/19 0905 98 %     Weight 01/05/19 0905 174 lb 2.6 oz (79 kg)     Height 01/05/19 0905 5\' 8"  (1.727 m)     Head Circumference --      Peak Flow --      Pain Score 01/05/19 0916 7     Pain Loc --      Pain Edu? --      Excl. in [place]? --     Constitutional: Alert and oriented. Well appearing and in no distress. Eyes: Right eye swelling is noted, extensive chemosis, normal pupillary response, there appears to be disconjugate gaze.  Normal intraocular pressure with a typical measurement of 17, normal visual acuity, he was 20/40 in both eyes ENT      Head: Normocephalic and atraumatic.      Nose: No congestion/rhinnorhea.      Mouth/Throat: Mucous membranes are moist.      Neck: No stridor. Cardiovascular: Normal rate, regular rhythm. No murmurs, rubs, or gallops. Respiratory: Normal respiratory effort without tachypnea nor retractions.  Breath sounds are clear and equal bilaterally. No wheezes/rales/rhonchi. Musculoskeletal: Nontender with normal range of motion in extremities. No lower extremity tenderness nor edema. Neurologic:  Normal speech and language. No gross focal neurologic deficits are appreciated.  Skin:  Skin is warm, dry and intact. No rash noted. ____________________________________________  ED COURSE:  As part of my medical decision making, I reviewed the following data within the [place] History obtained from family if available, nursing notes, old chart and ekg, as well as notes from prior ED visits. Patient presented for right eye pain and swelling, we will assess with labs and imaging as indicated at this time.   Procedures ____________________________________________   LABS (pertinent positives/negatives)  Labs Reviewed  COMPREHENSIVE METABOLIC PANEL - Abnormal; Notable for the following components:      Result Value   Sodium 134 (*)    Chloride 95 (*)    BUN 56 (*)    Creatinine, Ser 8.28 (*)    Total Protein 5.3 (*)  Albumin 3.4 (*)    Alkaline Phosphatase 31 (*)    GFR calc non Af Amer 7 (*)    GFR calc Af Amer 8 (*)    All other components within normal limits  CBC WITH DIFFERENTIAL/PLATELET - Abnormal; Notable for the following components:   WBC 11.6 (*)    RBC 3.27 (*)    Hemoglobin 10.5 (*)    HCT 31.1 (*)    RDW 15.9 (*)    Platelets 138 (*)    Monocytes Absolute 1.2 (*)    All other components within normal limits  ___________________________________________   DIFFERENTIAL DIAGNOSIS   Chemosis, ocular pseudotumor, trauma, conjunctivitis  FINAL ASSESSMENT AND PLAN  Right eye pain, ocular pseudotumor   Plan: The patient had presented for right eye pain with a complicated history of same. Patient's labs were within normal limits for him.  Does have normal visual acuity and normal eye pressure.  I have discussed with ophthalmology and patient will be  treated with oral prednisone and will have close outpatient follow-up.   Laurence Aly, MD    Note: This note was generated in part or whole with voice recognition software. Voice recognition is usually quite accurate but there are transcription errors that can and very often do occur. I apologize for any typographical errors that were not detected and corrected.     Earleen Newport, MD 01/05/19 1229

## 2019-01-05 NOTE — ED Triage Notes (Signed)
Patient presents to ED from group home with c/o right eye pain. Known history of Wegeners granulomatosis. Patient speaks [place]. Edema and redness noted to eye.

## 2019-01-05 NOTE — ED Notes (Signed)
First Nurse Note: Patient to ED via EMS because of "fluid coming from his eye".  Patient lives in [place] on [address]  Speaks [place].  BP 166/95, P-77, 96% on room air.  Alert, sitting in WC.

## 2019-01-05 NOTE — ED Notes (Signed)
AAOx3.  Skin warm and dry.  NAD 

## 2019-01-05 NOTE — ED Notes (Signed)
Sent green and purple top tubes to lab. 

## 2019-01-05 NOTE — ED Notes (Signed)
First Nurse Note: VS rechecked. No new complaints verbalized.

## 2019-01-07 DIAGNOSIS — N2581 Secondary hyperparathyroidism of renal origin: Secondary | ICD-10-CM | POA: Diagnosis not present

## 2019-01-07 DIAGNOSIS — D631 Anemia in chronic kidney disease: Secondary | ICD-10-CM | POA: Diagnosis not present

## 2019-01-07 DIAGNOSIS — N186 End stage renal disease: Secondary | ICD-10-CM | POA: Diagnosis not present

## 2019-01-07 DIAGNOSIS — E1129 Type 2 diabetes mellitus with other diabetic kidney complication: Secondary | ICD-10-CM | POA: Diagnosis not present

## 2019-01-07 DIAGNOSIS — Z23 Encounter for immunization: Secondary | ICD-10-CM | POA: Diagnosis not present

## 2019-01-07 DIAGNOSIS — D509 Iron deficiency anemia, unspecified: Secondary | ICD-10-CM | POA: Diagnosis not present

## 2019-01-09 DIAGNOSIS — D631 Anemia in chronic kidney disease: Secondary | ICD-10-CM | POA: Diagnosis not present

## 2019-01-09 DIAGNOSIS — E1129 Type 2 diabetes mellitus with other diabetic kidney complication: Secondary | ICD-10-CM | POA: Diagnosis not present

## 2019-01-09 DIAGNOSIS — N186 End stage renal disease: Secondary | ICD-10-CM | POA: Diagnosis not present

## 2019-01-09 DIAGNOSIS — Z23 Encounter for immunization: Secondary | ICD-10-CM | POA: Diagnosis not present

## 2019-01-09 DIAGNOSIS — N2581 Secondary hyperparathyroidism of renal origin: Secondary | ICD-10-CM | POA: Diagnosis not present

## 2019-01-09 DIAGNOSIS — D509 Iron deficiency anemia, unspecified: Secondary | ICD-10-CM | POA: Diagnosis not present

## 2019-01-12 DIAGNOSIS — E1129 Type 2 diabetes mellitus with other diabetic kidney complication: Secondary | ICD-10-CM | POA: Diagnosis not present

## 2019-01-12 DIAGNOSIS — D509 Iron deficiency anemia, unspecified: Secondary | ICD-10-CM | POA: Diagnosis not present

## 2019-01-12 DIAGNOSIS — N2581 Secondary hyperparathyroidism of renal origin: Secondary | ICD-10-CM | POA: Diagnosis not present

## 2019-01-12 DIAGNOSIS — Z23 Encounter for immunization: Secondary | ICD-10-CM | POA: Diagnosis not present

## 2019-01-12 DIAGNOSIS — N186 End stage renal disease: Secondary | ICD-10-CM | POA: Diagnosis not present

## 2019-01-12 DIAGNOSIS — D631 Anemia in chronic kidney disease: Secondary | ICD-10-CM | POA: Diagnosis not present

## 2019-01-14 DIAGNOSIS — N2581 Secondary hyperparathyroidism of renal origin: Secondary | ICD-10-CM | POA: Diagnosis not present

## 2019-01-14 DIAGNOSIS — D509 Iron deficiency anemia, unspecified: Secondary | ICD-10-CM | POA: Diagnosis not present

## 2019-01-14 DIAGNOSIS — N186 End stage renal disease: Secondary | ICD-10-CM | POA: Diagnosis not present

## 2019-01-14 DIAGNOSIS — E1129 Type 2 diabetes mellitus with other diabetic kidney complication: Secondary | ICD-10-CM | POA: Diagnosis not present

## 2019-01-14 DIAGNOSIS — Z23 Encounter for immunization: Secondary | ICD-10-CM | POA: Diagnosis not present

## 2019-01-14 DIAGNOSIS — D631 Anemia in chronic kidney disease: Secondary | ICD-10-CM | POA: Diagnosis not present

## 2019-01-16 DIAGNOSIS — Z23 Encounter for immunization: Secondary | ICD-10-CM | POA: Diagnosis not present

## 2019-01-16 DIAGNOSIS — D509 Iron deficiency anemia, unspecified: Secondary | ICD-10-CM | POA: Diagnosis not present

## 2019-01-16 DIAGNOSIS — E1129 Type 2 diabetes mellitus with other diabetic kidney complication: Secondary | ICD-10-CM | POA: Diagnosis not present

## 2019-01-16 DIAGNOSIS — N186 End stage renal disease: Secondary | ICD-10-CM | POA: Diagnosis not present

## 2019-01-16 DIAGNOSIS — N2581 Secondary hyperparathyroidism of renal origin: Secondary | ICD-10-CM | POA: Diagnosis not present

## 2019-01-16 DIAGNOSIS — D631 Anemia in chronic kidney disease: Secondary | ICD-10-CM | POA: Diagnosis not present

## 2019-01-19 DIAGNOSIS — D509 Iron deficiency anemia, unspecified: Secondary | ICD-10-CM | POA: Diagnosis not present

## 2019-01-19 DIAGNOSIS — N2581 Secondary hyperparathyroidism of renal origin: Secondary | ICD-10-CM | POA: Diagnosis not present

## 2019-01-19 DIAGNOSIS — E1129 Type 2 diabetes mellitus with other diabetic kidney complication: Secondary | ICD-10-CM | POA: Diagnosis not present

## 2019-01-19 DIAGNOSIS — Z23 Encounter for immunization: Secondary | ICD-10-CM | POA: Diagnosis not present

## 2019-01-19 DIAGNOSIS — D631 Anemia in chronic kidney disease: Secondary | ICD-10-CM | POA: Diagnosis not present

## 2019-01-19 DIAGNOSIS — N186 End stage renal disease: Secondary | ICD-10-CM | POA: Diagnosis not present

## 2019-01-21 DIAGNOSIS — D509 Iron deficiency anemia, unspecified: Secondary | ICD-10-CM | POA: Diagnosis not present

## 2019-01-21 DIAGNOSIS — N2581 Secondary hyperparathyroidism of renal origin: Secondary | ICD-10-CM | POA: Diagnosis not present

## 2019-01-21 DIAGNOSIS — D631 Anemia in chronic kidney disease: Secondary | ICD-10-CM | POA: Diagnosis not present

## 2019-01-21 DIAGNOSIS — E1129 Type 2 diabetes mellitus with other diabetic kidney complication: Secondary | ICD-10-CM | POA: Diagnosis not present

## 2019-01-21 DIAGNOSIS — N186 End stage renal disease: Secondary | ICD-10-CM | POA: Diagnosis not present

## 2019-01-21 DIAGNOSIS — Z23 Encounter for immunization: Secondary | ICD-10-CM | POA: Diagnosis not present

## 2019-01-23 DIAGNOSIS — D509 Iron deficiency anemia, unspecified: Secondary | ICD-10-CM | POA: Diagnosis not present

## 2019-01-23 DIAGNOSIS — Z23 Encounter for immunization: Secondary | ICD-10-CM | POA: Diagnosis not present

## 2019-01-23 DIAGNOSIS — N186 End stage renal disease: Secondary | ICD-10-CM | POA: Diagnosis not present

## 2019-01-23 DIAGNOSIS — D631 Anemia in chronic kidney disease: Secondary | ICD-10-CM | POA: Diagnosis not present

## 2019-01-23 DIAGNOSIS — N2581 Secondary hyperparathyroidism of renal origin: Secondary | ICD-10-CM | POA: Diagnosis not present

## 2019-01-23 DIAGNOSIS — E1129 Type 2 diabetes mellitus with other diabetic kidney complication: Secondary | ICD-10-CM | POA: Diagnosis not present

## 2019-01-26 DIAGNOSIS — D509 Iron deficiency anemia, unspecified: Secondary | ICD-10-CM | POA: Diagnosis not present

## 2019-01-26 DIAGNOSIS — N186 End stage renal disease: Secondary | ICD-10-CM | POA: Diagnosis not present

## 2019-01-26 DIAGNOSIS — D631 Anemia in chronic kidney disease: Secondary | ICD-10-CM | POA: Diagnosis not present

## 2019-01-26 DIAGNOSIS — E1129 Type 2 diabetes mellitus with other diabetic kidney complication: Secondary | ICD-10-CM | POA: Diagnosis not present

## 2019-01-26 DIAGNOSIS — Z23 Encounter for immunization: Secondary | ICD-10-CM | POA: Diagnosis not present

## 2019-01-26 DIAGNOSIS — N2581 Secondary hyperparathyroidism of renal origin: Secondary | ICD-10-CM | POA: Diagnosis not present

## 2019-01-27 DIAGNOSIS — N186 End stage renal disease: Secondary | ICD-10-CM | POA: Diagnosis not present

## 2019-01-27 DIAGNOSIS — Z992 Dependence on renal dialysis: Secondary | ICD-10-CM | POA: Diagnosis not present

## 2019-01-27 DIAGNOSIS — N032 Chronic nephritic syndrome with diffuse membranous glomerulonephritis: Secondary | ICD-10-CM | POA: Diagnosis not present

## 2019-01-28 DIAGNOSIS — D631 Anemia in chronic kidney disease: Secondary | ICD-10-CM | POA: Diagnosis not present

## 2019-01-28 DIAGNOSIS — Z23 Encounter for immunization: Secondary | ICD-10-CM | POA: Diagnosis not present

## 2019-01-28 DIAGNOSIS — D509 Iron deficiency anemia, unspecified: Secondary | ICD-10-CM | POA: Diagnosis not present

## 2019-01-28 DIAGNOSIS — E1129 Type 2 diabetes mellitus with other diabetic kidney complication: Secondary | ICD-10-CM | POA: Diagnosis not present

## 2019-01-28 DIAGNOSIS — N2581 Secondary hyperparathyroidism of renal origin: Secondary | ICD-10-CM | POA: Diagnosis not present

## 2019-01-28 DIAGNOSIS — N186 End stage renal disease: Secondary | ICD-10-CM | POA: Diagnosis not present

## 2019-01-29 ENCOUNTER — Encounter: Payer: Self-pay | Admitting: Internal Medicine

## 2019-01-30 DIAGNOSIS — N186 End stage renal disease: Secondary | ICD-10-CM | POA: Diagnosis not present

## 2019-01-30 DIAGNOSIS — E1129 Type 2 diabetes mellitus with other diabetic kidney complication: Secondary | ICD-10-CM | POA: Diagnosis not present

## 2019-01-30 DIAGNOSIS — N2581 Secondary hyperparathyroidism of renal origin: Secondary | ICD-10-CM | POA: Diagnosis not present

## 2019-01-30 DIAGNOSIS — D509 Iron deficiency anemia, unspecified: Secondary | ICD-10-CM | POA: Diagnosis not present

## 2019-01-30 DIAGNOSIS — Z23 Encounter for immunization: Secondary | ICD-10-CM | POA: Diagnosis not present

## 2019-01-30 DIAGNOSIS — D631 Anemia in chronic kidney disease: Secondary | ICD-10-CM | POA: Diagnosis not present

## 2019-02-02 DIAGNOSIS — N186 End stage renal disease: Secondary | ICD-10-CM | POA: Diagnosis not present

## 2019-02-02 DIAGNOSIS — D631 Anemia in chronic kidney disease: Secondary | ICD-10-CM | POA: Diagnosis not present

## 2019-02-02 DIAGNOSIS — N2581 Secondary hyperparathyroidism of renal origin: Secondary | ICD-10-CM | POA: Diagnosis not present

## 2019-02-02 DIAGNOSIS — Z23 Encounter for immunization: Secondary | ICD-10-CM | POA: Diagnosis not present

## 2019-02-02 DIAGNOSIS — E1129 Type 2 diabetes mellitus with other diabetic kidney complication: Secondary | ICD-10-CM | POA: Diagnosis not present

## 2019-02-02 DIAGNOSIS — D509 Iron deficiency anemia, unspecified: Secondary | ICD-10-CM | POA: Diagnosis not present

## 2019-02-04 DIAGNOSIS — E1129 Type 2 diabetes mellitus with other diabetic kidney complication: Secondary | ICD-10-CM | POA: Diagnosis not present

## 2019-02-04 DIAGNOSIS — Z23 Encounter for immunization: Secondary | ICD-10-CM | POA: Diagnosis not present

## 2019-02-04 DIAGNOSIS — N2581 Secondary hyperparathyroidism of renal origin: Secondary | ICD-10-CM | POA: Diagnosis not present

## 2019-02-04 DIAGNOSIS — N186 End stage renal disease: Secondary | ICD-10-CM | POA: Diagnosis not present

## 2019-02-04 DIAGNOSIS — D631 Anemia in chronic kidney disease: Secondary | ICD-10-CM | POA: Diagnosis not present

## 2019-02-04 DIAGNOSIS — D509 Iron deficiency anemia, unspecified: Secondary | ICD-10-CM | POA: Diagnosis not present

## 2019-02-06 DIAGNOSIS — E1129 Type 2 diabetes mellitus with other diabetic kidney complication: Secondary | ICD-10-CM | POA: Diagnosis not present

## 2019-02-06 DIAGNOSIS — N2581 Secondary hyperparathyroidism of renal origin: Secondary | ICD-10-CM | POA: Diagnosis not present

## 2019-02-06 DIAGNOSIS — D509 Iron deficiency anemia, unspecified: Secondary | ICD-10-CM | POA: Diagnosis not present

## 2019-02-06 DIAGNOSIS — D631 Anemia in chronic kidney disease: Secondary | ICD-10-CM | POA: Diagnosis not present

## 2019-02-06 DIAGNOSIS — N186 End stage renal disease: Secondary | ICD-10-CM | POA: Diagnosis not present

## 2019-02-06 DIAGNOSIS — Z23 Encounter for immunization: Secondary | ICD-10-CM | POA: Diagnosis not present

## 2019-02-09 DIAGNOSIS — N2581 Secondary hyperparathyroidism of renal origin: Secondary | ICD-10-CM | POA: Diagnosis not present

## 2019-02-09 DIAGNOSIS — E1129 Type 2 diabetes mellitus with other diabetic kidney complication: Secondary | ICD-10-CM | POA: Diagnosis not present

## 2019-02-09 DIAGNOSIS — N186 End stage renal disease: Secondary | ICD-10-CM | POA: Diagnosis not present

## 2019-02-09 DIAGNOSIS — D509 Iron deficiency anemia, unspecified: Secondary | ICD-10-CM | POA: Diagnosis not present

## 2019-02-09 DIAGNOSIS — Z23 Encounter for immunization: Secondary | ICD-10-CM | POA: Diagnosis not present

## 2019-02-09 DIAGNOSIS — D631 Anemia in chronic kidney disease: Secondary | ICD-10-CM | POA: Diagnosis not present

## 2019-02-11 DIAGNOSIS — N2581 Secondary hyperparathyroidism of renal origin: Secondary | ICD-10-CM | POA: Diagnosis not present

## 2019-02-11 DIAGNOSIS — D509 Iron deficiency anemia, unspecified: Secondary | ICD-10-CM | POA: Diagnosis not present

## 2019-02-11 DIAGNOSIS — D631 Anemia in chronic kidney disease: Secondary | ICD-10-CM | POA: Diagnosis not present

## 2019-02-11 DIAGNOSIS — E1129 Type 2 diabetes mellitus with other diabetic kidney complication: Secondary | ICD-10-CM | POA: Diagnosis not present

## 2019-02-11 DIAGNOSIS — N186 End stage renal disease: Secondary | ICD-10-CM | POA: Diagnosis not present

## 2019-02-11 DIAGNOSIS — Z23 Encounter for immunization: Secondary | ICD-10-CM | POA: Diagnosis not present

## 2019-02-12 ENCOUNTER — Ambulatory Visit: Payer: Medicare Other | Admitting: Internal Medicine

## 2019-02-13 DIAGNOSIS — D631 Anemia in chronic kidney disease: Secondary | ICD-10-CM | POA: Diagnosis not present

## 2019-02-13 DIAGNOSIS — N186 End stage renal disease: Secondary | ICD-10-CM | POA: Diagnosis not present

## 2019-02-13 DIAGNOSIS — E1129 Type 2 diabetes mellitus with other diabetic kidney complication: Secondary | ICD-10-CM | POA: Diagnosis not present

## 2019-02-13 DIAGNOSIS — D509 Iron deficiency anemia, unspecified: Secondary | ICD-10-CM | POA: Diagnosis not present

## 2019-02-13 DIAGNOSIS — Z23 Encounter for immunization: Secondary | ICD-10-CM | POA: Diagnosis not present

## 2019-02-13 DIAGNOSIS — N2581 Secondary hyperparathyroidism of renal origin: Secondary | ICD-10-CM | POA: Diagnosis not present

## 2019-02-15 DIAGNOSIS — I340 Nonrheumatic mitral (valve) insufficiency: Secondary | ICD-10-CM | POA: Diagnosis not present

## 2019-02-15 DIAGNOSIS — R9439 Abnormal result of other cardiovascular function study: Secondary | ICD-10-CM | POA: Diagnosis not present

## 2019-02-15 DIAGNOSIS — Z95810 Presence of automatic (implantable) cardiac defibrillator: Secondary | ICD-10-CM | POA: Diagnosis not present

## 2019-02-15 DIAGNOSIS — I4901 Ventricular fibrillation: Secondary | ICD-10-CM | POA: Diagnosis not present

## 2019-02-15 DIAGNOSIS — I6529 Occlusion and stenosis of unspecified carotid artery: Secondary | ICD-10-CM | POA: Diagnosis not present

## 2019-02-15 DIAGNOSIS — I517 Cardiomegaly: Secondary | ICD-10-CM | POA: Diagnosis not present

## 2019-02-16 DIAGNOSIS — N2581 Secondary hyperparathyroidism of renal origin: Secondary | ICD-10-CM | POA: Diagnosis not present

## 2019-02-16 DIAGNOSIS — M79661 Pain in right lower leg: Secondary | ICD-10-CM | POA: Diagnosis not present

## 2019-02-16 DIAGNOSIS — D509 Iron deficiency anemia, unspecified: Secondary | ICD-10-CM | POA: Diagnosis not present

## 2019-02-16 DIAGNOSIS — E1129 Type 2 diabetes mellitus with other diabetic kidney complication: Secondary | ICD-10-CM | POA: Diagnosis not present

## 2019-02-16 DIAGNOSIS — D631 Anemia in chronic kidney disease: Secondary | ICD-10-CM | POA: Diagnosis not present

## 2019-02-16 DIAGNOSIS — G608 Other hereditary and idiopathic neuropathies: Secondary | ICD-10-CM | POA: Diagnosis not present

## 2019-02-16 DIAGNOSIS — N186 End stage renal disease: Secondary | ICD-10-CM | POA: Diagnosis not present

## 2019-02-16 DIAGNOSIS — Z23 Encounter for immunization: Secondary | ICD-10-CM | POA: Diagnosis not present

## 2019-02-16 DIAGNOSIS — G603 Idiopathic progressive neuropathy: Secondary | ICD-10-CM | POA: Diagnosis not present

## 2019-02-18 DIAGNOSIS — D509 Iron deficiency anemia, unspecified: Secondary | ICD-10-CM | POA: Diagnosis not present

## 2019-02-18 DIAGNOSIS — E1129 Type 2 diabetes mellitus with other diabetic kidney complication: Secondary | ICD-10-CM | POA: Diagnosis not present

## 2019-02-18 DIAGNOSIS — N2581 Secondary hyperparathyroidism of renal origin: Secondary | ICD-10-CM | POA: Diagnosis not present

## 2019-02-18 DIAGNOSIS — N186 End stage renal disease: Secondary | ICD-10-CM | POA: Diagnosis not present

## 2019-02-18 DIAGNOSIS — Z23 Encounter for immunization: Secondary | ICD-10-CM | POA: Diagnosis not present

## 2019-02-18 DIAGNOSIS — D631 Anemia in chronic kidney disease: Secondary | ICD-10-CM | POA: Diagnosis not present

## 2019-02-20 DIAGNOSIS — N186 End stage renal disease: Secondary | ICD-10-CM | POA: Diagnosis not present

## 2019-02-20 DIAGNOSIS — E1129 Type 2 diabetes mellitus with other diabetic kidney complication: Secondary | ICD-10-CM | POA: Diagnosis not present

## 2019-02-20 DIAGNOSIS — N2581 Secondary hyperparathyroidism of renal origin: Secondary | ICD-10-CM | POA: Diagnosis not present

## 2019-02-20 DIAGNOSIS — Z23 Encounter for immunization: Secondary | ICD-10-CM | POA: Diagnosis not present

## 2019-02-20 DIAGNOSIS — D509 Iron deficiency anemia, unspecified: Secondary | ICD-10-CM | POA: Diagnosis not present

## 2019-02-20 DIAGNOSIS — D631 Anemia in chronic kidney disease: Secondary | ICD-10-CM | POA: Diagnosis not present

## 2019-02-22 ENCOUNTER — Other Ambulatory Visit: Payer: Self-pay

## 2019-02-22 ENCOUNTER — Encounter: Payer: Medicare Other | Admitting: *Deleted

## 2019-02-23 DIAGNOSIS — D631 Anemia in chronic kidney disease: Secondary | ICD-10-CM | POA: Diagnosis not present

## 2019-02-23 DIAGNOSIS — E1129 Type 2 diabetes mellitus with other diabetic kidney complication: Secondary | ICD-10-CM | POA: Diagnosis not present

## 2019-02-23 DIAGNOSIS — Z23 Encounter for immunization: Secondary | ICD-10-CM | POA: Diagnosis not present

## 2019-02-23 DIAGNOSIS — D509 Iron deficiency anemia, unspecified: Secondary | ICD-10-CM | POA: Diagnosis not present

## 2019-02-23 DIAGNOSIS — N186 End stage renal disease: Secondary | ICD-10-CM | POA: Diagnosis not present

## 2019-02-23 DIAGNOSIS — N2581 Secondary hyperparathyroidism of renal origin: Secondary | ICD-10-CM | POA: Diagnosis not present

## 2019-02-25 DIAGNOSIS — D631 Anemia in chronic kidney disease: Secondary | ICD-10-CM | POA: Diagnosis not present

## 2019-02-25 DIAGNOSIS — Z23 Encounter for immunization: Secondary | ICD-10-CM | POA: Diagnosis not present

## 2019-02-25 DIAGNOSIS — N186 End stage renal disease: Secondary | ICD-10-CM | POA: Diagnosis not present

## 2019-02-25 DIAGNOSIS — E1129 Type 2 diabetes mellitus with other diabetic kidney complication: Secondary | ICD-10-CM | POA: Diagnosis not present

## 2019-02-25 DIAGNOSIS — N2581 Secondary hyperparathyroidism of renal origin: Secondary | ICD-10-CM | POA: Diagnosis not present

## 2019-02-25 DIAGNOSIS — D509 Iron deficiency anemia, unspecified: Secondary | ICD-10-CM | POA: Diagnosis not present

## 2019-02-26 DIAGNOSIS — Z992 Dependence on renal dialysis: Secondary | ICD-10-CM | POA: Diagnosis not present

## 2019-02-26 DIAGNOSIS — N032 Chronic nephritic syndrome with diffuse membranous glomerulonephritis: Secondary | ICD-10-CM | POA: Diagnosis not present

## 2019-02-26 DIAGNOSIS — N186 End stage renal disease: Secondary | ICD-10-CM | POA: Diagnosis not present

## 2019-02-27 DIAGNOSIS — N2581 Secondary hyperparathyroidism of renal origin: Secondary | ICD-10-CM | POA: Diagnosis not present

## 2019-02-27 DIAGNOSIS — D631 Anemia in chronic kidney disease: Secondary | ICD-10-CM | POA: Diagnosis not present

## 2019-02-27 DIAGNOSIS — N186 End stage renal disease: Secondary | ICD-10-CM | POA: Diagnosis not present

## 2019-02-27 DIAGNOSIS — Z23 Encounter for immunization: Secondary | ICD-10-CM | POA: Diagnosis not present

## 2019-02-27 DIAGNOSIS — E1129 Type 2 diabetes mellitus with other diabetic kidney complication: Secondary | ICD-10-CM | POA: Diagnosis not present

## 2019-02-27 DIAGNOSIS — D509 Iron deficiency anemia, unspecified: Secondary | ICD-10-CM | POA: Diagnosis not present

## 2019-03-02 DIAGNOSIS — D509 Iron deficiency anemia, unspecified: Secondary | ICD-10-CM | POA: Diagnosis not present

## 2019-03-02 DIAGNOSIS — E1129 Type 2 diabetes mellitus with other diabetic kidney complication: Secondary | ICD-10-CM | POA: Diagnosis not present

## 2019-03-02 DIAGNOSIS — Z23 Encounter for immunization: Secondary | ICD-10-CM | POA: Diagnosis not present

## 2019-03-02 DIAGNOSIS — N186 End stage renal disease: Secondary | ICD-10-CM | POA: Diagnosis not present

## 2019-03-02 DIAGNOSIS — D631 Anemia in chronic kidney disease: Secondary | ICD-10-CM | POA: Diagnosis not present

## 2019-03-04 DIAGNOSIS — D631 Anemia in chronic kidney disease: Secondary | ICD-10-CM | POA: Diagnosis not present

## 2019-03-04 DIAGNOSIS — N186 End stage renal disease: Secondary | ICD-10-CM | POA: Diagnosis not present

## 2019-03-04 DIAGNOSIS — D509 Iron deficiency anemia, unspecified: Secondary | ICD-10-CM | POA: Diagnosis not present

## 2019-03-04 DIAGNOSIS — Z23 Encounter for immunization: Secondary | ICD-10-CM | POA: Diagnosis not present

## 2019-03-04 DIAGNOSIS — E1129 Type 2 diabetes mellitus with other diabetic kidney complication: Secondary | ICD-10-CM | POA: Diagnosis not present

## 2019-03-06 DIAGNOSIS — D631 Anemia in chronic kidney disease: Secondary | ICD-10-CM | POA: Diagnosis not present

## 2019-03-06 DIAGNOSIS — D509 Iron deficiency anemia, unspecified: Secondary | ICD-10-CM | POA: Diagnosis not present

## 2019-03-06 DIAGNOSIS — N186 End stage renal disease: Secondary | ICD-10-CM | POA: Diagnosis not present

## 2019-03-06 DIAGNOSIS — E1129 Type 2 diabetes mellitus with other diabetic kidney complication: Secondary | ICD-10-CM | POA: Diagnosis not present

## 2019-03-06 DIAGNOSIS — Z23 Encounter for immunization: Secondary | ICD-10-CM | POA: Diagnosis not present

## 2019-03-09 DIAGNOSIS — D509 Iron deficiency anemia, unspecified: Secondary | ICD-10-CM | POA: Diagnosis not present

## 2019-03-09 DIAGNOSIS — N186 End stage renal disease: Secondary | ICD-10-CM | POA: Diagnosis not present

## 2019-03-09 DIAGNOSIS — Z23 Encounter for immunization: Secondary | ICD-10-CM | POA: Diagnosis not present

## 2019-03-09 DIAGNOSIS — E1129 Type 2 diabetes mellitus with other diabetic kidney complication: Secondary | ICD-10-CM | POA: Diagnosis not present

## 2019-03-09 DIAGNOSIS — D631 Anemia in chronic kidney disease: Secondary | ICD-10-CM | POA: Diagnosis not present

## 2019-03-11 DIAGNOSIS — D631 Anemia in chronic kidney disease: Secondary | ICD-10-CM | POA: Diagnosis not present

## 2019-03-11 DIAGNOSIS — Z23 Encounter for immunization: Secondary | ICD-10-CM | POA: Diagnosis not present

## 2019-03-11 DIAGNOSIS — N186 End stage renal disease: Secondary | ICD-10-CM | POA: Diagnosis not present

## 2019-03-11 DIAGNOSIS — D509 Iron deficiency anemia, unspecified: Secondary | ICD-10-CM | POA: Diagnosis not present

## 2019-03-11 DIAGNOSIS — E1129 Type 2 diabetes mellitus with other diabetic kidney complication: Secondary | ICD-10-CM | POA: Diagnosis not present

## 2019-03-13 DIAGNOSIS — E1129 Type 2 diabetes mellitus with other diabetic kidney complication: Secondary | ICD-10-CM | POA: Diagnosis not present

## 2019-03-13 DIAGNOSIS — D631 Anemia in chronic kidney disease: Secondary | ICD-10-CM | POA: Diagnosis not present

## 2019-03-13 DIAGNOSIS — D509 Iron deficiency anemia, unspecified: Secondary | ICD-10-CM | POA: Diagnosis not present

## 2019-03-13 DIAGNOSIS — N186 End stage renal disease: Secondary | ICD-10-CM | POA: Diagnosis not present

## 2019-03-13 DIAGNOSIS — Z23 Encounter for immunization: Secondary | ICD-10-CM | POA: Diagnosis not present

## 2019-03-16 DIAGNOSIS — E1129 Type 2 diabetes mellitus with other diabetic kidney complication: Secondary | ICD-10-CM | POA: Diagnosis not present

## 2019-03-16 DIAGNOSIS — N186 End stage renal disease: Secondary | ICD-10-CM | POA: Diagnosis not present

## 2019-03-16 DIAGNOSIS — D509 Iron deficiency anemia, unspecified: Secondary | ICD-10-CM | POA: Diagnosis not present

## 2019-03-16 DIAGNOSIS — D631 Anemia in chronic kidney disease: Secondary | ICD-10-CM | POA: Diagnosis not present

## 2019-03-16 DIAGNOSIS — Z23 Encounter for immunization: Secondary | ICD-10-CM | POA: Diagnosis not present

## 2019-03-18 DIAGNOSIS — D631 Anemia in chronic kidney disease: Secondary | ICD-10-CM | POA: Diagnosis not present

## 2019-03-18 DIAGNOSIS — Z23 Encounter for immunization: Secondary | ICD-10-CM | POA: Diagnosis not present

## 2019-03-18 DIAGNOSIS — N186 End stage renal disease: Secondary | ICD-10-CM | POA: Diagnosis not present

## 2019-03-18 DIAGNOSIS — E1129 Type 2 diabetes mellitus with other diabetic kidney complication: Secondary | ICD-10-CM | POA: Diagnosis not present

## 2019-03-18 DIAGNOSIS — D509 Iron deficiency anemia, unspecified: Secondary | ICD-10-CM | POA: Diagnosis not present

## 2019-03-20 DIAGNOSIS — Z23 Encounter for immunization: Secondary | ICD-10-CM | POA: Diagnosis not present

## 2019-03-20 DIAGNOSIS — D509 Iron deficiency anemia, unspecified: Secondary | ICD-10-CM | POA: Diagnosis not present

## 2019-03-20 DIAGNOSIS — E1129 Type 2 diabetes mellitus with other diabetic kidney complication: Secondary | ICD-10-CM | POA: Diagnosis not present

## 2019-03-20 DIAGNOSIS — N186 End stage renal disease: Secondary | ICD-10-CM | POA: Diagnosis not present

## 2019-03-20 DIAGNOSIS — D631 Anemia in chronic kidney disease: Secondary | ICD-10-CM | POA: Diagnosis not present

## 2019-03-23 DIAGNOSIS — N186 End stage renal disease: Secondary | ICD-10-CM | POA: Diagnosis not present

## 2019-03-23 DIAGNOSIS — E1129 Type 2 diabetes mellitus with other diabetic kidney complication: Secondary | ICD-10-CM | POA: Diagnosis not present

## 2019-03-23 DIAGNOSIS — D631 Anemia in chronic kidney disease: Secondary | ICD-10-CM | POA: Diagnosis not present

## 2019-03-23 DIAGNOSIS — D509 Iron deficiency anemia, unspecified: Secondary | ICD-10-CM | POA: Diagnosis not present

## 2019-03-23 DIAGNOSIS — Z23 Encounter for immunization: Secondary | ICD-10-CM | POA: Diagnosis not present

## 2019-03-25 DIAGNOSIS — D631 Anemia in chronic kidney disease: Secondary | ICD-10-CM | POA: Diagnosis not present

## 2019-03-25 DIAGNOSIS — N186 End stage renal disease: Secondary | ICD-10-CM | POA: Diagnosis not present

## 2019-03-25 DIAGNOSIS — Z23 Encounter for immunization: Secondary | ICD-10-CM | POA: Diagnosis not present

## 2019-03-25 DIAGNOSIS — D509 Iron deficiency anemia, unspecified: Secondary | ICD-10-CM | POA: Diagnosis not present

## 2019-03-25 DIAGNOSIS — E1129 Type 2 diabetes mellitus with other diabetic kidney complication: Secondary | ICD-10-CM | POA: Diagnosis not present

## 2019-03-27 DIAGNOSIS — N186 End stage renal disease: Secondary | ICD-10-CM | POA: Diagnosis not present

## 2019-03-27 DIAGNOSIS — Z23 Encounter for immunization: Secondary | ICD-10-CM | POA: Diagnosis not present

## 2019-03-27 DIAGNOSIS — D509 Iron deficiency anemia, unspecified: Secondary | ICD-10-CM | POA: Diagnosis not present

## 2019-03-27 DIAGNOSIS — D631 Anemia in chronic kidney disease: Secondary | ICD-10-CM | POA: Diagnosis not present

## 2019-03-27 DIAGNOSIS — E1129 Type 2 diabetes mellitus with other diabetic kidney complication: Secondary | ICD-10-CM | POA: Diagnosis not present

## 2019-03-29 DIAGNOSIS — N032 Chronic nephritic syndrome with diffuse membranous glomerulonephritis: Secondary | ICD-10-CM | POA: Diagnosis not present

## 2019-03-29 DIAGNOSIS — Z992 Dependence on renal dialysis: Secondary | ICD-10-CM | POA: Diagnosis not present

## 2019-03-29 DIAGNOSIS — N186 End stage renal disease: Secondary | ICD-10-CM | POA: Diagnosis not present

## 2019-03-30 DIAGNOSIS — D509 Iron deficiency anemia, unspecified: Secondary | ICD-10-CM | POA: Diagnosis not present

## 2019-03-30 DIAGNOSIS — N2581 Secondary hyperparathyroidism of renal origin: Secondary | ICD-10-CM | POA: Diagnosis not present

## 2019-03-30 DIAGNOSIS — N186 End stage renal disease: Secondary | ICD-10-CM | POA: Diagnosis not present

## 2019-03-30 DIAGNOSIS — E1129 Type 2 diabetes mellitus with other diabetic kidney complication: Secondary | ICD-10-CM | POA: Diagnosis not present

## 2019-03-30 DIAGNOSIS — D631 Anemia in chronic kidney disease: Secondary | ICD-10-CM | POA: Diagnosis not present

## 2019-03-31 DIAGNOSIS — F250 Schizoaffective disorder, bipolar type: Secondary | ICD-10-CM | POA: Diagnosis not present

## 2019-04-01 DIAGNOSIS — E1129 Type 2 diabetes mellitus with other diabetic kidney complication: Secondary | ICD-10-CM | POA: Diagnosis not present

## 2019-04-01 DIAGNOSIS — N186 End stage renal disease: Secondary | ICD-10-CM | POA: Diagnosis not present

## 2019-04-01 DIAGNOSIS — D509 Iron deficiency anemia, unspecified: Secondary | ICD-10-CM | POA: Diagnosis not present

## 2019-04-01 DIAGNOSIS — N2581 Secondary hyperparathyroidism of renal origin: Secondary | ICD-10-CM | POA: Diagnosis not present

## 2019-04-01 DIAGNOSIS — D631 Anemia in chronic kidney disease: Secondary | ICD-10-CM | POA: Diagnosis not present

## 2019-04-03 DIAGNOSIS — N2581 Secondary hyperparathyroidism of renal origin: Secondary | ICD-10-CM | POA: Diagnosis not present

## 2019-04-03 DIAGNOSIS — D631 Anemia in chronic kidney disease: Secondary | ICD-10-CM | POA: Diagnosis not present

## 2019-04-03 DIAGNOSIS — E1129 Type 2 diabetes mellitus with other diabetic kidney complication: Secondary | ICD-10-CM | POA: Diagnosis not present

## 2019-04-03 DIAGNOSIS — N186 End stage renal disease: Secondary | ICD-10-CM | POA: Diagnosis not present

## 2019-04-03 DIAGNOSIS — D509 Iron deficiency anemia, unspecified: Secondary | ICD-10-CM | POA: Diagnosis not present

## 2019-04-06 DIAGNOSIS — N186 End stage renal disease: Secondary | ICD-10-CM | POA: Diagnosis not present

## 2019-04-06 DIAGNOSIS — N2581 Secondary hyperparathyroidism of renal origin: Secondary | ICD-10-CM | POA: Diagnosis not present

## 2019-04-06 DIAGNOSIS — D509 Iron deficiency anemia, unspecified: Secondary | ICD-10-CM | POA: Diagnosis not present

## 2019-04-06 DIAGNOSIS — E1129 Type 2 diabetes mellitus with other diabetic kidney complication: Secondary | ICD-10-CM | POA: Diagnosis not present

## 2019-04-06 DIAGNOSIS — D631 Anemia in chronic kidney disease: Secondary | ICD-10-CM | POA: Diagnosis not present

## 2019-04-08 DIAGNOSIS — D509 Iron deficiency anemia, unspecified: Secondary | ICD-10-CM | POA: Diagnosis not present

## 2019-04-08 DIAGNOSIS — N186 End stage renal disease: Secondary | ICD-10-CM | POA: Diagnosis not present

## 2019-04-08 DIAGNOSIS — E1129 Type 2 diabetes mellitus with other diabetic kidney complication: Secondary | ICD-10-CM | POA: Diagnosis not present

## 2019-04-08 DIAGNOSIS — N2581 Secondary hyperparathyroidism of renal origin: Secondary | ICD-10-CM | POA: Diagnosis not present

## 2019-04-08 DIAGNOSIS — D631 Anemia in chronic kidney disease: Secondary | ICD-10-CM | POA: Diagnosis not present

## 2019-04-10 DIAGNOSIS — N2581 Secondary hyperparathyroidism of renal origin: Secondary | ICD-10-CM | POA: Diagnosis not present

## 2019-04-10 DIAGNOSIS — N186 End stage renal disease: Secondary | ICD-10-CM | POA: Diagnosis not present

## 2019-04-10 DIAGNOSIS — D509 Iron deficiency anemia, unspecified: Secondary | ICD-10-CM | POA: Diagnosis not present

## 2019-04-10 DIAGNOSIS — D631 Anemia in chronic kidney disease: Secondary | ICD-10-CM | POA: Diagnosis not present

## 2019-04-10 DIAGNOSIS — E1129 Type 2 diabetes mellitus with other diabetic kidney complication: Secondary | ICD-10-CM | POA: Diagnosis not present

## 2019-04-13 DIAGNOSIS — N2581 Secondary hyperparathyroidism of renal origin: Secondary | ICD-10-CM | POA: Diagnosis not present

## 2019-04-13 DIAGNOSIS — E1129 Type 2 diabetes mellitus with other diabetic kidney complication: Secondary | ICD-10-CM | POA: Diagnosis not present

## 2019-04-13 DIAGNOSIS — N186 End stage renal disease: Secondary | ICD-10-CM | POA: Diagnosis not present

## 2019-04-13 DIAGNOSIS — D631 Anemia in chronic kidney disease: Secondary | ICD-10-CM | POA: Diagnosis not present

## 2019-04-13 DIAGNOSIS — D509 Iron deficiency anemia, unspecified: Secondary | ICD-10-CM | POA: Diagnosis not present

## 2019-04-15 DIAGNOSIS — D631 Anemia in chronic kidney disease: Secondary | ICD-10-CM | POA: Diagnosis not present

## 2019-04-15 DIAGNOSIS — D509 Iron deficiency anemia, unspecified: Secondary | ICD-10-CM | POA: Diagnosis not present

## 2019-04-15 DIAGNOSIS — N186 End stage renal disease: Secondary | ICD-10-CM | POA: Diagnosis not present

## 2019-04-15 DIAGNOSIS — N2581 Secondary hyperparathyroidism of renal origin: Secondary | ICD-10-CM | POA: Diagnosis not present

## 2019-04-15 DIAGNOSIS — E1129 Type 2 diabetes mellitus with other diabetic kidney complication: Secondary | ICD-10-CM | POA: Diagnosis not present

## 2019-04-17 DIAGNOSIS — N2581 Secondary hyperparathyroidism of renal origin: Secondary | ICD-10-CM | POA: Diagnosis not present

## 2019-04-17 DIAGNOSIS — D631 Anemia in chronic kidney disease: Secondary | ICD-10-CM | POA: Diagnosis not present

## 2019-04-17 DIAGNOSIS — E1129 Type 2 diabetes mellitus with other diabetic kidney complication: Secondary | ICD-10-CM | POA: Diagnosis not present

## 2019-04-17 DIAGNOSIS — D509 Iron deficiency anemia, unspecified: Secondary | ICD-10-CM | POA: Diagnosis not present

## 2019-04-17 DIAGNOSIS — N186 End stage renal disease: Secondary | ICD-10-CM | POA: Diagnosis not present

## 2019-04-19 ENCOUNTER — Other Ambulatory Visit: Payer: Medicare Other

## 2019-04-19 ENCOUNTER — Inpatient Hospital Stay: Admission: RE | Admit: 2019-04-19 | Payer: Medicare Other | Source: Ambulatory Visit

## 2019-04-20 DIAGNOSIS — E1129 Type 2 diabetes mellitus with other diabetic kidney complication: Secondary | ICD-10-CM | POA: Diagnosis not present

## 2019-04-20 DIAGNOSIS — N186 End stage renal disease: Secondary | ICD-10-CM | POA: Diagnosis not present

## 2019-04-20 DIAGNOSIS — N2581 Secondary hyperparathyroidism of renal origin: Secondary | ICD-10-CM | POA: Diagnosis not present

## 2019-04-20 DIAGNOSIS — D631 Anemia in chronic kidney disease: Secondary | ICD-10-CM | POA: Diagnosis not present

## 2019-04-20 DIAGNOSIS — D509 Iron deficiency anemia, unspecified: Secondary | ICD-10-CM | POA: Diagnosis not present

## 2019-04-22 DIAGNOSIS — N186 End stage renal disease: Secondary | ICD-10-CM | POA: Diagnosis not present

## 2019-04-22 DIAGNOSIS — D509 Iron deficiency anemia, unspecified: Secondary | ICD-10-CM | POA: Diagnosis not present

## 2019-04-22 DIAGNOSIS — D631 Anemia in chronic kidney disease: Secondary | ICD-10-CM | POA: Diagnosis not present

## 2019-04-22 DIAGNOSIS — N2581 Secondary hyperparathyroidism of renal origin: Secondary | ICD-10-CM | POA: Diagnosis not present

## 2019-04-22 DIAGNOSIS — E1129 Type 2 diabetes mellitus with other diabetic kidney complication: Secondary | ICD-10-CM | POA: Diagnosis not present

## 2019-04-24 DIAGNOSIS — N2581 Secondary hyperparathyroidism of renal origin: Secondary | ICD-10-CM | POA: Diagnosis not present

## 2019-04-24 DIAGNOSIS — E1129 Type 2 diabetes mellitus with other diabetic kidney complication: Secondary | ICD-10-CM | POA: Diagnosis not present

## 2019-04-24 DIAGNOSIS — N186 End stage renal disease: Secondary | ICD-10-CM | POA: Diagnosis not present

## 2019-04-24 DIAGNOSIS — D509 Iron deficiency anemia, unspecified: Secondary | ICD-10-CM | POA: Diagnosis not present

## 2019-04-24 DIAGNOSIS — D631 Anemia in chronic kidney disease: Secondary | ICD-10-CM | POA: Diagnosis not present

## 2019-04-27 DIAGNOSIS — N2581 Secondary hyperparathyroidism of renal origin: Secondary | ICD-10-CM | POA: Diagnosis not present

## 2019-04-27 DIAGNOSIS — E1129 Type 2 diabetes mellitus with other diabetic kidney complication: Secondary | ICD-10-CM | POA: Diagnosis not present

## 2019-04-27 DIAGNOSIS — D631 Anemia in chronic kidney disease: Secondary | ICD-10-CM | POA: Diagnosis not present

## 2019-04-27 DIAGNOSIS — D509 Iron deficiency anemia, unspecified: Secondary | ICD-10-CM | POA: Diagnosis not present

## 2019-04-27 DIAGNOSIS — N186 End stage renal disease: Secondary | ICD-10-CM | POA: Diagnosis not present

## 2019-04-28 DIAGNOSIS — N186 End stage renal disease: Secondary | ICD-10-CM | POA: Diagnosis not present

## 2019-04-28 DIAGNOSIS — Z992 Dependence on renal dialysis: Secondary | ICD-10-CM | POA: Diagnosis not present

## 2019-04-28 DIAGNOSIS — N032 Chronic nephritic syndrome with diffuse membranous glomerulonephritis: Secondary | ICD-10-CM | POA: Diagnosis not present

## 2019-04-29 DIAGNOSIS — E1129 Type 2 diabetes mellitus with other diabetic kidney complication: Secondary | ICD-10-CM | POA: Diagnosis not present

## 2019-04-29 DIAGNOSIS — D509 Iron deficiency anemia, unspecified: Secondary | ICD-10-CM | POA: Diagnosis not present

## 2019-04-29 DIAGNOSIS — N2581 Secondary hyperparathyroidism of renal origin: Secondary | ICD-10-CM | POA: Diagnosis not present

## 2019-04-29 DIAGNOSIS — N186 End stage renal disease: Secondary | ICD-10-CM | POA: Diagnosis not present

## 2019-05-01 DIAGNOSIS — N2581 Secondary hyperparathyroidism of renal origin: Secondary | ICD-10-CM | POA: Diagnosis not present

## 2019-05-01 DIAGNOSIS — N186 End stage renal disease: Secondary | ICD-10-CM | POA: Diagnosis not present

## 2019-05-01 DIAGNOSIS — D509 Iron deficiency anemia, unspecified: Secondary | ICD-10-CM | POA: Diagnosis not present

## 2019-05-01 DIAGNOSIS — E1129 Type 2 diabetes mellitus with other diabetic kidney complication: Secondary | ICD-10-CM | POA: Diagnosis not present

## 2019-05-04 DIAGNOSIS — N186 End stage renal disease: Secondary | ICD-10-CM | POA: Diagnosis not present

## 2019-05-04 DIAGNOSIS — D509 Iron deficiency anemia, unspecified: Secondary | ICD-10-CM | POA: Diagnosis not present

## 2019-05-04 DIAGNOSIS — E1129 Type 2 diabetes mellitus with other diabetic kidney complication: Secondary | ICD-10-CM | POA: Diagnosis not present

## 2019-05-04 DIAGNOSIS — N2581 Secondary hyperparathyroidism of renal origin: Secondary | ICD-10-CM | POA: Diagnosis not present

## 2019-05-06 DIAGNOSIS — D509 Iron deficiency anemia, unspecified: Secondary | ICD-10-CM | POA: Diagnosis not present

## 2019-05-06 DIAGNOSIS — N186 End stage renal disease: Secondary | ICD-10-CM | POA: Diagnosis not present

## 2019-05-06 DIAGNOSIS — E1129 Type 2 diabetes mellitus with other diabetic kidney complication: Secondary | ICD-10-CM | POA: Diagnosis not present

## 2019-05-06 DIAGNOSIS — N2581 Secondary hyperparathyroidism of renal origin: Secondary | ICD-10-CM | POA: Diagnosis not present

## 2019-05-08 DIAGNOSIS — D509 Iron deficiency anemia, unspecified: Secondary | ICD-10-CM | POA: Diagnosis not present

## 2019-05-08 DIAGNOSIS — E1129 Type 2 diabetes mellitus with other diabetic kidney complication: Secondary | ICD-10-CM | POA: Diagnosis not present

## 2019-05-08 DIAGNOSIS — N2581 Secondary hyperparathyroidism of renal origin: Secondary | ICD-10-CM | POA: Diagnosis not present

## 2019-05-08 DIAGNOSIS — N186 End stage renal disease: Secondary | ICD-10-CM | POA: Diagnosis not present

## 2019-05-11 DIAGNOSIS — D509 Iron deficiency anemia, unspecified: Secondary | ICD-10-CM | POA: Diagnosis not present

## 2019-05-11 DIAGNOSIS — E1129 Type 2 diabetes mellitus with other diabetic kidney complication: Secondary | ICD-10-CM | POA: Diagnosis not present

## 2019-05-11 DIAGNOSIS — N186 End stage renal disease: Secondary | ICD-10-CM | POA: Diagnosis not present

## 2019-05-11 DIAGNOSIS — N2581 Secondary hyperparathyroidism of renal origin: Secondary | ICD-10-CM | POA: Diagnosis not present

## 2019-05-12 DIAGNOSIS — F209 Schizophrenia, unspecified: Secondary | ICD-10-CM | POA: Diagnosis not present

## 2019-05-12 DIAGNOSIS — N186 End stage renal disease: Secondary | ICD-10-CM | POA: Diagnosis not present

## 2019-05-12 DIAGNOSIS — G4700 Insomnia, unspecified: Secondary | ICD-10-CM | POA: Diagnosis not present

## 2019-05-12 DIAGNOSIS — Z992 Dependence on renal dialysis: Secondary | ICD-10-CM | POA: Diagnosis not present

## 2019-05-13 DIAGNOSIS — D509 Iron deficiency anemia, unspecified: Secondary | ICD-10-CM | POA: Diagnosis not present

## 2019-05-13 DIAGNOSIS — N186 End stage renal disease: Secondary | ICD-10-CM | POA: Diagnosis not present

## 2019-05-13 DIAGNOSIS — N2581 Secondary hyperparathyroidism of renal origin: Secondary | ICD-10-CM | POA: Diagnosis not present

## 2019-05-13 DIAGNOSIS — E1129 Type 2 diabetes mellitus with other diabetic kidney complication: Secondary | ICD-10-CM | POA: Diagnosis not present

## 2019-05-15 DIAGNOSIS — E1129 Type 2 diabetes mellitus with other diabetic kidney complication: Secondary | ICD-10-CM | POA: Diagnosis not present

## 2019-05-15 DIAGNOSIS — D509 Iron deficiency anemia, unspecified: Secondary | ICD-10-CM | POA: Diagnosis not present

## 2019-05-15 DIAGNOSIS — N2581 Secondary hyperparathyroidism of renal origin: Secondary | ICD-10-CM | POA: Diagnosis not present

## 2019-05-15 DIAGNOSIS — N186 End stage renal disease: Secondary | ICD-10-CM | POA: Diagnosis not present

## 2019-05-18 DIAGNOSIS — D509 Iron deficiency anemia, unspecified: Secondary | ICD-10-CM | POA: Diagnosis not present

## 2019-05-18 DIAGNOSIS — E1129 Type 2 diabetes mellitus with other diabetic kidney complication: Secondary | ICD-10-CM | POA: Diagnosis not present

## 2019-05-18 DIAGNOSIS — N2581 Secondary hyperparathyroidism of renal origin: Secondary | ICD-10-CM | POA: Diagnosis not present

## 2019-05-18 DIAGNOSIS — N186 End stage renal disease: Secondary | ICD-10-CM | POA: Diagnosis not present

## 2019-05-20 DIAGNOSIS — D509 Iron deficiency anemia, unspecified: Secondary | ICD-10-CM | POA: Diagnosis not present

## 2019-05-20 DIAGNOSIS — E1129 Type 2 diabetes mellitus with other diabetic kidney complication: Secondary | ICD-10-CM | POA: Diagnosis not present

## 2019-05-20 DIAGNOSIS — N186 End stage renal disease: Secondary | ICD-10-CM | POA: Diagnosis not present

## 2019-05-20 DIAGNOSIS — N2581 Secondary hyperparathyroidism of renal origin: Secondary | ICD-10-CM | POA: Diagnosis not present

## 2019-05-22 DIAGNOSIS — D509 Iron deficiency anemia, unspecified: Secondary | ICD-10-CM | POA: Diagnosis not present

## 2019-05-22 DIAGNOSIS — N2581 Secondary hyperparathyroidism of renal origin: Secondary | ICD-10-CM | POA: Diagnosis not present

## 2019-05-22 DIAGNOSIS — E1129 Type 2 diabetes mellitus with other diabetic kidney complication: Secondary | ICD-10-CM | POA: Diagnosis not present

## 2019-05-22 DIAGNOSIS — N186 End stage renal disease: Secondary | ICD-10-CM | POA: Diagnosis not present

## 2019-05-25 DIAGNOSIS — N2581 Secondary hyperparathyroidism of renal origin: Secondary | ICD-10-CM | POA: Diagnosis not present

## 2019-05-25 DIAGNOSIS — E1129 Type 2 diabetes mellitus with other diabetic kidney complication: Secondary | ICD-10-CM | POA: Diagnosis not present

## 2019-05-25 DIAGNOSIS — N186 End stage renal disease: Secondary | ICD-10-CM | POA: Diagnosis not present

## 2019-05-25 DIAGNOSIS — D509 Iron deficiency anemia, unspecified: Secondary | ICD-10-CM | POA: Diagnosis not present

## 2019-05-27 DIAGNOSIS — D509 Iron deficiency anemia, unspecified: Secondary | ICD-10-CM | POA: Diagnosis not present

## 2019-05-27 DIAGNOSIS — E1129 Type 2 diabetes mellitus with other diabetic kidney complication: Secondary | ICD-10-CM | POA: Diagnosis not present

## 2019-05-27 DIAGNOSIS — N186 End stage renal disease: Secondary | ICD-10-CM | POA: Diagnosis not present

## 2019-05-27 DIAGNOSIS — N2581 Secondary hyperparathyroidism of renal origin: Secondary | ICD-10-CM | POA: Diagnosis not present

## 2019-05-29 DIAGNOSIS — E1129 Type 2 diabetes mellitus with other diabetic kidney complication: Secondary | ICD-10-CM | POA: Diagnosis not present

## 2019-05-29 DIAGNOSIS — N032 Chronic nephritic syndrome with diffuse membranous glomerulonephritis: Secondary | ICD-10-CM | POA: Diagnosis not present

## 2019-05-29 DIAGNOSIS — D509 Iron deficiency anemia, unspecified: Secondary | ICD-10-CM | POA: Diagnosis not present

## 2019-05-29 DIAGNOSIS — N186 End stage renal disease: Secondary | ICD-10-CM | POA: Diagnosis not present

## 2019-05-29 DIAGNOSIS — Z992 Dependence on renal dialysis: Secondary | ICD-10-CM | POA: Diagnosis not present

## 2019-05-29 DIAGNOSIS — N2581 Secondary hyperparathyroidism of renal origin: Secondary | ICD-10-CM | POA: Diagnosis not present

## 2019-06-01 DIAGNOSIS — Z992 Dependence on renal dialysis: Secondary | ICD-10-CM | POA: Diagnosis not present

## 2019-06-01 DIAGNOSIS — E1129 Type 2 diabetes mellitus with other diabetic kidney complication: Secondary | ICD-10-CM | POA: Diagnosis not present

## 2019-06-01 DIAGNOSIS — N186 End stage renal disease: Secondary | ICD-10-CM | POA: Diagnosis not present

## 2019-06-01 DIAGNOSIS — N2581 Secondary hyperparathyroidism of renal origin: Secondary | ICD-10-CM | POA: Diagnosis not present

## 2019-06-01 DIAGNOSIS — D509 Iron deficiency anemia, unspecified: Secondary | ICD-10-CM | POA: Diagnosis not present

## 2019-06-03 DIAGNOSIS — N186 End stage renal disease: Secondary | ICD-10-CM | POA: Diagnosis not present

## 2019-06-03 DIAGNOSIS — Z992 Dependence on renal dialysis: Secondary | ICD-10-CM | POA: Diagnosis not present

## 2019-06-03 DIAGNOSIS — N2581 Secondary hyperparathyroidism of renal origin: Secondary | ICD-10-CM | POA: Diagnosis not present

## 2019-06-03 DIAGNOSIS — D509 Iron deficiency anemia, unspecified: Secondary | ICD-10-CM | POA: Diagnosis not present

## 2019-06-03 DIAGNOSIS — E1129 Type 2 diabetes mellitus with other diabetic kidney complication: Secondary | ICD-10-CM | POA: Diagnosis not present

## 2019-06-05 DIAGNOSIS — D509 Iron deficiency anemia, unspecified: Secondary | ICD-10-CM | POA: Diagnosis not present

## 2019-06-05 DIAGNOSIS — N2581 Secondary hyperparathyroidism of renal origin: Secondary | ICD-10-CM | POA: Diagnosis not present

## 2019-06-05 DIAGNOSIS — E1129 Type 2 diabetes mellitus with other diabetic kidney complication: Secondary | ICD-10-CM | POA: Diagnosis not present

## 2019-06-05 DIAGNOSIS — Z992 Dependence on renal dialysis: Secondary | ICD-10-CM | POA: Diagnosis not present

## 2019-06-05 DIAGNOSIS — N186 End stage renal disease: Secondary | ICD-10-CM | POA: Diagnosis not present

## 2019-06-08 DIAGNOSIS — Z992 Dependence on renal dialysis: Secondary | ICD-10-CM | POA: Diagnosis not present

## 2019-06-08 DIAGNOSIS — N2581 Secondary hyperparathyroidism of renal origin: Secondary | ICD-10-CM | POA: Diagnosis not present

## 2019-06-08 DIAGNOSIS — D509 Iron deficiency anemia, unspecified: Secondary | ICD-10-CM | POA: Diagnosis not present

## 2019-06-08 DIAGNOSIS — E1129 Type 2 diabetes mellitus with other diabetic kidney complication: Secondary | ICD-10-CM | POA: Diagnosis not present

## 2019-06-08 DIAGNOSIS — N186 End stage renal disease: Secondary | ICD-10-CM | POA: Diagnosis not present

## 2019-06-09 DIAGNOSIS — N186 End stage renal disease: Secondary | ICD-10-CM | POA: Diagnosis not present

## 2019-06-09 DIAGNOSIS — F209 Schizophrenia, unspecified: Secondary | ICD-10-CM | POA: Diagnosis not present

## 2019-06-09 DIAGNOSIS — Z79899 Other long term (current) drug therapy: Secondary | ICD-10-CM | POA: Diagnosis not present

## 2019-06-09 DIAGNOSIS — I10 Essential (primary) hypertension: Secondary | ICD-10-CM | POA: Diagnosis not present

## 2019-06-10 DIAGNOSIS — N2581 Secondary hyperparathyroidism of renal origin: Secondary | ICD-10-CM | POA: Diagnosis not present

## 2019-06-10 DIAGNOSIS — D509 Iron deficiency anemia, unspecified: Secondary | ICD-10-CM | POA: Diagnosis not present

## 2019-06-10 DIAGNOSIS — N186 End stage renal disease: Secondary | ICD-10-CM | POA: Diagnosis not present

## 2019-06-10 DIAGNOSIS — E1129 Type 2 diabetes mellitus with other diabetic kidney complication: Secondary | ICD-10-CM | POA: Diagnosis not present

## 2019-06-10 DIAGNOSIS — Z992 Dependence on renal dialysis: Secondary | ICD-10-CM | POA: Diagnosis not present

## 2019-06-12 DIAGNOSIS — D509 Iron deficiency anemia, unspecified: Secondary | ICD-10-CM | POA: Diagnosis not present

## 2019-06-12 DIAGNOSIS — Z992 Dependence on renal dialysis: Secondary | ICD-10-CM | POA: Diagnosis not present

## 2019-06-12 DIAGNOSIS — E1129 Type 2 diabetes mellitus with other diabetic kidney complication: Secondary | ICD-10-CM | POA: Diagnosis not present

## 2019-06-12 DIAGNOSIS — N2581 Secondary hyperparathyroidism of renal origin: Secondary | ICD-10-CM | POA: Diagnosis not present

## 2019-06-12 DIAGNOSIS — N186 End stage renal disease: Secondary | ICD-10-CM | POA: Diagnosis not present

## 2019-06-15 DIAGNOSIS — N186 End stage renal disease: Secondary | ICD-10-CM | POA: Diagnosis not present

## 2019-06-15 DIAGNOSIS — D509 Iron deficiency anemia, unspecified: Secondary | ICD-10-CM | POA: Diagnosis not present

## 2019-06-15 DIAGNOSIS — Z992 Dependence on renal dialysis: Secondary | ICD-10-CM | POA: Diagnosis not present

## 2019-06-15 DIAGNOSIS — E1129 Type 2 diabetes mellitus with other diabetic kidney complication: Secondary | ICD-10-CM | POA: Diagnosis not present

## 2019-06-15 DIAGNOSIS — N2581 Secondary hyperparathyroidism of renal origin: Secondary | ICD-10-CM | POA: Diagnosis not present

## 2019-06-17 DIAGNOSIS — N186 End stage renal disease: Secondary | ICD-10-CM | POA: Diagnosis not present

## 2019-06-17 DIAGNOSIS — D509 Iron deficiency anemia, unspecified: Secondary | ICD-10-CM | POA: Diagnosis not present

## 2019-06-17 DIAGNOSIS — N2581 Secondary hyperparathyroidism of renal origin: Secondary | ICD-10-CM | POA: Diagnosis not present

## 2019-06-17 DIAGNOSIS — E1129 Type 2 diabetes mellitus with other diabetic kidney complication: Secondary | ICD-10-CM | POA: Diagnosis not present

## 2019-06-17 DIAGNOSIS — Z992 Dependence on renal dialysis: Secondary | ICD-10-CM | POA: Diagnosis not present

## 2019-06-19 DIAGNOSIS — N2581 Secondary hyperparathyroidism of renal origin: Secondary | ICD-10-CM | POA: Diagnosis not present

## 2019-06-19 DIAGNOSIS — Z992 Dependence on renal dialysis: Secondary | ICD-10-CM | POA: Diagnosis not present

## 2019-06-19 DIAGNOSIS — D509 Iron deficiency anemia, unspecified: Secondary | ICD-10-CM | POA: Diagnosis not present

## 2019-06-19 DIAGNOSIS — N186 End stage renal disease: Secondary | ICD-10-CM | POA: Diagnosis not present

## 2019-06-19 DIAGNOSIS — E1129 Type 2 diabetes mellitus with other diabetic kidney complication: Secondary | ICD-10-CM | POA: Diagnosis not present

## 2019-06-22 DIAGNOSIS — D509 Iron deficiency anemia, unspecified: Secondary | ICD-10-CM | POA: Diagnosis not present

## 2019-06-22 DIAGNOSIS — Z992 Dependence on renal dialysis: Secondary | ICD-10-CM | POA: Diagnosis not present

## 2019-06-22 DIAGNOSIS — N2581 Secondary hyperparathyroidism of renal origin: Secondary | ICD-10-CM | POA: Diagnosis not present

## 2019-06-22 DIAGNOSIS — E1129 Type 2 diabetes mellitus with other diabetic kidney complication: Secondary | ICD-10-CM | POA: Diagnosis not present

## 2019-06-22 DIAGNOSIS — N186 End stage renal disease: Secondary | ICD-10-CM | POA: Diagnosis not present

## 2019-06-23 DIAGNOSIS — I10 Essential (primary) hypertension: Secondary | ICD-10-CM | POA: Diagnosis not present

## 2019-06-23 DIAGNOSIS — F209 Schizophrenia, unspecified: Secondary | ICD-10-CM | POA: Diagnosis not present

## 2019-06-23 DIAGNOSIS — N186 End stage renal disease: Secondary | ICD-10-CM | POA: Diagnosis not present

## 2019-06-23 DIAGNOSIS — Z992 Dependence on renal dialysis: Secondary | ICD-10-CM | POA: Diagnosis not present

## 2019-06-24 DIAGNOSIS — N2581 Secondary hyperparathyroidism of renal origin: Secondary | ICD-10-CM | POA: Diagnosis not present

## 2019-06-24 DIAGNOSIS — Z992 Dependence on renal dialysis: Secondary | ICD-10-CM | POA: Diagnosis not present

## 2019-06-24 DIAGNOSIS — N186 End stage renal disease: Secondary | ICD-10-CM | POA: Diagnosis not present

## 2019-06-24 DIAGNOSIS — E1129 Type 2 diabetes mellitus with other diabetic kidney complication: Secondary | ICD-10-CM | POA: Diagnosis not present

## 2019-06-24 DIAGNOSIS — D509 Iron deficiency anemia, unspecified: Secondary | ICD-10-CM | POA: Diagnosis not present

## 2019-06-26 DIAGNOSIS — E1129 Type 2 diabetes mellitus with other diabetic kidney complication: Secondary | ICD-10-CM | POA: Diagnosis not present

## 2019-06-26 DIAGNOSIS — N2581 Secondary hyperparathyroidism of renal origin: Secondary | ICD-10-CM | POA: Diagnosis not present

## 2019-06-26 DIAGNOSIS — D509 Iron deficiency anemia, unspecified: Secondary | ICD-10-CM | POA: Diagnosis not present

## 2019-06-26 DIAGNOSIS — N186 End stage renal disease: Secondary | ICD-10-CM | POA: Diagnosis not present

## 2019-06-26 DIAGNOSIS — Z992 Dependence on renal dialysis: Secondary | ICD-10-CM | POA: Diagnosis not present

## 2019-06-29 DIAGNOSIS — Z23 Encounter for immunization: Secondary | ICD-10-CM | POA: Diagnosis not present

## 2019-06-29 DIAGNOSIS — E1129 Type 2 diabetes mellitus with other diabetic kidney complication: Secondary | ICD-10-CM | POA: Diagnosis not present

## 2019-06-29 DIAGNOSIS — D509 Iron deficiency anemia, unspecified: Secondary | ICD-10-CM | POA: Diagnosis not present

## 2019-06-29 DIAGNOSIS — N186 End stage renal disease: Secondary | ICD-10-CM | POA: Diagnosis not present

## 2019-06-29 DIAGNOSIS — D631 Anemia in chronic kidney disease: Secondary | ICD-10-CM | POA: Diagnosis not present

## 2019-06-29 DIAGNOSIS — N032 Chronic nephritic syndrome with diffuse membranous glomerulonephritis: Secondary | ICD-10-CM | POA: Diagnosis not present

## 2019-06-29 DIAGNOSIS — Z992 Dependence on renal dialysis: Secondary | ICD-10-CM | POA: Diagnosis not present

## 2019-06-29 DIAGNOSIS — N2581 Secondary hyperparathyroidism of renal origin: Secondary | ICD-10-CM | POA: Diagnosis not present

## 2019-07-01 DIAGNOSIS — E1129 Type 2 diabetes mellitus with other diabetic kidney complication: Secondary | ICD-10-CM | POA: Diagnosis not present

## 2019-07-01 DIAGNOSIS — Z992 Dependence on renal dialysis: Secondary | ICD-10-CM | POA: Diagnosis not present

## 2019-07-01 DIAGNOSIS — N186 End stage renal disease: Secondary | ICD-10-CM | POA: Diagnosis not present

## 2019-07-01 DIAGNOSIS — Z23 Encounter for immunization: Secondary | ICD-10-CM | POA: Diagnosis not present

## 2019-07-01 DIAGNOSIS — D509 Iron deficiency anemia, unspecified: Secondary | ICD-10-CM | POA: Diagnosis not present

## 2019-07-01 DIAGNOSIS — D631 Anemia in chronic kidney disease: Secondary | ICD-10-CM | POA: Diagnosis not present

## 2019-07-03 DIAGNOSIS — Z23 Encounter for immunization: Secondary | ICD-10-CM | POA: Diagnosis not present

## 2019-07-03 DIAGNOSIS — N186 End stage renal disease: Secondary | ICD-10-CM | POA: Diagnosis not present

## 2019-07-03 DIAGNOSIS — E1129 Type 2 diabetes mellitus with other diabetic kidney complication: Secondary | ICD-10-CM | POA: Diagnosis not present

## 2019-07-03 DIAGNOSIS — D631 Anemia in chronic kidney disease: Secondary | ICD-10-CM | POA: Diagnosis not present

## 2019-07-03 DIAGNOSIS — D509 Iron deficiency anemia, unspecified: Secondary | ICD-10-CM | POA: Diagnosis not present

## 2019-07-03 DIAGNOSIS — Z992 Dependence on renal dialysis: Secondary | ICD-10-CM | POA: Diagnosis not present

## 2019-07-06 DIAGNOSIS — Z23 Encounter for immunization: Secondary | ICD-10-CM | POA: Diagnosis not present

## 2019-07-06 DIAGNOSIS — Z992 Dependence on renal dialysis: Secondary | ICD-10-CM | POA: Diagnosis not present

## 2019-07-06 DIAGNOSIS — E1129 Type 2 diabetes mellitus with other diabetic kidney complication: Secondary | ICD-10-CM | POA: Diagnosis not present

## 2019-07-06 DIAGNOSIS — D631 Anemia in chronic kidney disease: Secondary | ICD-10-CM | POA: Diagnosis not present

## 2019-07-06 DIAGNOSIS — N186 End stage renal disease: Secondary | ICD-10-CM | POA: Diagnosis not present

## 2019-07-06 DIAGNOSIS — D509 Iron deficiency anemia, unspecified: Secondary | ICD-10-CM | POA: Diagnosis not present

## 2019-07-08 DIAGNOSIS — D509 Iron deficiency anemia, unspecified: Secondary | ICD-10-CM | POA: Diagnosis not present

## 2019-07-08 DIAGNOSIS — Z992 Dependence on renal dialysis: Secondary | ICD-10-CM | POA: Diagnosis not present

## 2019-07-08 DIAGNOSIS — Z23 Encounter for immunization: Secondary | ICD-10-CM | POA: Diagnosis not present

## 2019-07-08 DIAGNOSIS — D631 Anemia in chronic kidney disease: Secondary | ICD-10-CM | POA: Diagnosis not present

## 2019-07-08 DIAGNOSIS — E1129 Type 2 diabetes mellitus with other diabetic kidney complication: Secondary | ICD-10-CM | POA: Diagnosis not present

## 2019-07-08 DIAGNOSIS — N186 End stage renal disease: Secondary | ICD-10-CM | POA: Diagnosis not present

## 2019-07-10 DIAGNOSIS — Z992 Dependence on renal dialysis: Secondary | ICD-10-CM | POA: Diagnosis not present

## 2019-07-10 DIAGNOSIS — D631 Anemia in chronic kidney disease: Secondary | ICD-10-CM | POA: Diagnosis not present

## 2019-07-10 DIAGNOSIS — E1129 Type 2 diabetes mellitus with other diabetic kidney complication: Secondary | ICD-10-CM | POA: Diagnosis not present

## 2019-07-10 DIAGNOSIS — Z23 Encounter for immunization: Secondary | ICD-10-CM | POA: Diagnosis not present

## 2019-07-10 DIAGNOSIS — D509 Iron deficiency anemia, unspecified: Secondary | ICD-10-CM | POA: Diagnosis not present

## 2019-07-10 DIAGNOSIS — N186 End stage renal disease: Secondary | ICD-10-CM | POA: Diagnosis not present

## 2019-07-13 DIAGNOSIS — D631 Anemia in chronic kidney disease: Secondary | ICD-10-CM | POA: Diagnosis not present

## 2019-07-13 DIAGNOSIS — D509 Iron deficiency anemia, unspecified: Secondary | ICD-10-CM | POA: Diagnosis not present

## 2019-07-13 DIAGNOSIS — E1129 Type 2 diabetes mellitus with other diabetic kidney complication: Secondary | ICD-10-CM | POA: Diagnosis not present

## 2019-07-13 DIAGNOSIS — Z992 Dependence on renal dialysis: Secondary | ICD-10-CM | POA: Diagnosis not present

## 2019-07-13 DIAGNOSIS — N186 End stage renal disease: Secondary | ICD-10-CM | POA: Diagnosis not present

## 2019-07-13 DIAGNOSIS — Z23 Encounter for immunization: Secondary | ICD-10-CM | POA: Diagnosis not present

## 2019-07-15 DIAGNOSIS — N186 End stage renal disease: Secondary | ICD-10-CM | POA: Diagnosis not present

## 2019-07-15 DIAGNOSIS — D509 Iron deficiency anemia, unspecified: Secondary | ICD-10-CM | POA: Diagnosis not present

## 2019-07-15 DIAGNOSIS — D631 Anemia in chronic kidney disease: Secondary | ICD-10-CM | POA: Diagnosis not present

## 2019-07-15 DIAGNOSIS — Z23 Encounter for immunization: Secondary | ICD-10-CM | POA: Diagnosis not present

## 2019-07-15 DIAGNOSIS — Z992 Dependence on renal dialysis: Secondary | ICD-10-CM | POA: Diagnosis not present

## 2019-07-15 DIAGNOSIS — E1129 Type 2 diabetes mellitus with other diabetic kidney complication: Secondary | ICD-10-CM | POA: Diagnosis not present

## 2019-07-17 DIAGNOSIS — D631 Anemia in chronic kidney disease: Secondary | ICD-10-CM | POA: Diagnosis not present

## 2019-07-17 DIAGNOSIS — D509 Iron deficiency anemia, unspecified: Secondary | ICD-10-CM | POA: Diagnosis not present

## 2019-07-17 DIAGNOSIS — E1129 Type 2 diabetes mellitus with other diabetic kidney complication: Secondary | ICD-10-CM | POA: Diagnosis not present

## 2019-07-17 DIAGNOSIS — N186 End stage renal disease: Secondary | ICD-10-CM | POA: Diagnosis not present

## 2019-07-17 DIAGNOSIS — Z23 Encounter for immunization: Secondary | ICD-10-CM | POA: Diagnosis not present

## 2019-07-17 DIAGNOSIS — Z992 Dependence on renal dialysis: Secondary | ICD-10-CM | POA: Diagnosis not present

## 2019-07-20 DIAGNOSIS — E1129 Type 2 diabetes mellitus with other diabetic kidney complication: Secondary | ICD-10-CM | POA: Diagnosis not present

## 2019-07-20 DIAGNOSIS — D509 Iron deficiency anemia, unspecified: Secondary | ICD-10-CM | POA: Diagnosis not present

## 2019-07-20 DIAGNOSIS — Z23 Encounter for immunization: Secondary | ICD-10-CM | POA: Diagnosis not present

## 2019-07-20 DIAGNOSIS — D631 Anemia in chronic kidney disease: Secondary | ICD-10-CM | POA: Diagnosis not present

## 2019-07-20 DIAGNOSIS — N186 End stage renal disease: Secondary | ICD-10-CM | POA: Diagnosis not present

## 2019-07-20 DIAGNOSIS — Z992 Dependence on renal dialysis: Secondary | ICD-10-CM | POA: Diagnosis not present

## 2019-07-21 DIAGNOSIS — N186 End stage renal disease: Secondary | ICD-10-CM | POA: Diagnosis not present

## 2019-07-21 DIAGNOSIS — F209 Schizophrenia, unspecified: Secondary | ICD-10-CM | POA: Diagnosis not present

## 2019-07-21 DIAGNOSIS — I10 Essential (primary) hypertension: Secondary | ICD-10-CM | POA: Diagnosis not present

## 2019-07-22 DIAGNOSIS — D509 Iron deficiency anemia, unspecified: Secondary | ICD-10-CM | POA: Diagnosis not present

## 2019-07-22 DIAGNOSIS — Z992 Dependence on renal dialysis: Secondary | ICD-10-CM | POA: Diagnosis not present

## 2019-07-22 DIAGNOSIS — N186 End stage renal disease: Secondary | ICD-10-CM | POA: Diagnosis not present

## 2019-07-22 DIAGNOSIS — D631 Anemia in chronic kidney disease: Secondary | ICD-10-CM | POA: Diagnosis not present

## 2019-07-22 DIAGNOSIS — Z23 Encounter for immunization: Secondary | ICD-10-CM | POA: Diagnosis not present

## 2019-07-22 DIAGNOSIS — E1129 Type 2 diabetes mellitus with other diabetic kidney complication: Secondary | ICD-10-CM | POA: Diagnosis not present

## 2019-07-24 DIAGNOSIS — Z23 Encounter for immunization: Secondary | ICD-10-CM | POA: Diagnosis not present

## 2019-07-24 DIAGNOSIS — N186 End stage renal disease: Secondary | ICD-10-CM | POA: Diagnosis not present

## 2019-07-24 DIAGNOSIS — Z992 Dependence on renal dialysis: Secondary | ICD-10-CM | POA: Diagnosis not present

## 2019-07-24 DIAGNOSIS — E1129 Type 2 diabetes mellitus with other diabetic kidney complication: Secondary | ICD-10-CM | POA: Diagnosis not present

## 2019-07-24 DIAGNOSIS — D631 Anemia in chronic kidney disease: Secondary | ICD-10-CM | POA: Diagnosis not present

## 2019-07-24 DIAGNOSIS — D509 Iron deficiency anemia, unspecified: Secondary | ICD-10-CM | POA: Diagnosis not present

## 2019-07-27 DIAGNOSIS — D631 Anemia in chronic kidney disease: Secondary | ICD-10-CM | POA: Diagnosis not present

## 2019-07-27 DIAGNOSIS — Z23 Encounter for immunization: Secondary | ICD-10-CM | POA: Diagnosis not present

## 2019-07-27 DIAGNOSIS — D509 Iron deficiency anemia, unspecified: Secondary | ICD-10-CM | POA: Diagnosis not present

## 2019-07-27 DIAGNOSIS — E1129 Type 2 diabetes mellitus with other diabetic kidney complication: Secondary | ICD-10-CM | POA: Diagnosis not present

## 2019-07-27 DIAGNOSIS — N186 End stage renal disease: Secondary | ICD-10-CM | POA: Diagnosis not present

## 2019-07-27 DIAGNOSIS — Z992 Dependence on renal dialysis: Secondary | ICD-10-CM | POA: Diagnosis not present

## 2019-07-29 DIAGNOSIS — N032 Chronic nephritic syndrome with diffuse membranous glomerulonephritis: Secondary | ICD-10-CM | POA: Diagnosis not present

## 2019-07-29 DIAGNOSIS — N2581 Secondary hyperparathyroidism of renal origin: Secondary | ICD-10-CM | POA: Diagnosis not present

## 2019-07-29 DIAGNOSIS — D509 Iron deficiency anemia, unspecified: Secondary | ICD-10-CM | POA: Diagnosis not present

## 2019-07-29 DIAGNOSIS — N186 End stage renal disease: Secondary | ICD-10-CM | POA: Diagnosis not present

## 2019-07-29 DIAGNOSIS — E1129 Type 2 diabetes mellitus with other diabetic kidney complication: Secondary | ICD-10-CM | POA: Diagnosis not present

## 2019-07-29 DIAGNOSIS — Z992 Dependence on renal dialysis: Secondary | ICD-10-CM | POA: Diagnosis not present

## 2019-07-29 DIAGNOSIS — D631 Anemia in chronic kidney disease: Secondary | ICD-10-CM | POA: Diagnosis not present

## 2019-07-31 DIAGNOSIS — D509 Iron deficiency anemia, unspecified: Secondary | ICD-10-CM | POA: Diagnosis not present

## 2019-07-31 DIAGNOSIS — Z992 Dependence on renal dialysis: Secondary | ICD-10-CM | POA: Diagnosis not present

## 2019-07-31 DIAGNOSIS — D631 Anemia in chronic kidney disease: Secondary | ICD-10-CM | POA: Diagnosis not present

## 2019-07-31 DIAGNOSIS — E1129 Type 2 diabetes mellitus with other diabetic kidney complication: Secondary | ICD-10-CM | POA: Diagnosis not present

## 2019-07-31 DIAGNOSIS — N2581 Secondary hyperparathyroidism of renal origin: Secondary | ICD-10-CM | POA: Diagnosis not present

## 2019-07-31 DIAGNOSIS — N186 End stage renal disease: Secondary | ICD-10-CM | POA: Diagnosis not present

## 2019-08-03 DIAGNOSIS — D631 Anemia in chronic kidney disease: Secondary | ICD-10-CM | POA: Diagnosis not present

## 2019-08-03 DIAGNOSIS — Z992 Dependence on renal dialysis: Secondary | ICD-10-CM | POA: Diagnosis not present

## 2019-08-03 DIAGNOSIS — N2581 Secondary hyperparathyroidism of renal origin: Secondary | ICD-10-CM | POA: Diagnosis not present

## 2019-08-03 DIAGNOSIS — E1129 Type 2 diabetes mellitus with other diabetic kidney complication: Secondary | ICD-10-CM | POA: Diagnosis not present

## 2019-08-03 DIAGNOSIS — D509 Iron deficiency anemia, unspecified: Secondary | ICD-10-CM | POA: Diagnosis not present

## 2019-08-03 DIAGNOSIS — N186 End stage renal disease: Secondary | ICD-10-CM | POA: Diagnosis not present

## 2019-08-05 DIAGNOSIS — N186 End stage renal disease: Secondary | ICD-10-CM | POA: Diagnosis not present

## 2019-08-05 DIAGNOSIS — E1129 Type 2 diabetes mellitus with other diabetic kidney complication: Secondary | ICD-10-CM | POA: Diagnosis not present

## 2019-08-05 DIAGNOSIS — N2581 Secondary hyperparathyroidism of renal origin: Secondary | ICD-10-CM | POA: Diagnosis not present

## 2019-08-05 DIAGNOSIS — D509 Iron deficiency anemia, unspecified: Secondary | ICD-10-CM | POA: Diagnosis not present

## 2019-08-05 DIAGNOSIS — D631 Anemia in chronic kidney disease: Secondary | ICD-10-CM | POA: Diagnosis not present

## 2019-08-05 DIAGNOSIS — Z992 Dependence on renal dialysis: Secondary | ICD-10-CM | POA: Diagnosis not present

## 2019-08-07 DIAGNOSIS — N2581 Secondary hyperparathyroidism of renal origin: Secondary | ICD-10-CM | POA: Diagnosis not present

## 2019-08-07 DIAGNOSIS — D631 Anemia in chronic kidney disease: Secondary | ICD-10-CM | POA: Diagnosis not present

## 2019-08-07 DIAGNOSIS — N186 End stage renal disease: Secondary | ICD-10-CM | POA: Diagnosis not present

## 2019-08-07 DIAGNOSIS — Z992 Dependence on renal dialysis: Secondary | ICD-10-CM | POA: Diagnosis not present

## 2019-08-07 DIAGNOSIS — D509 Iron deficiency anemia, unspecified: Secondary | ICD-10-CM | POA: Diagnosis not present

## 2019-08-07 DIAGNOSIS — E1129 Type 2 diabetes mellitus with other diabetic kidney complication: Secondary | ICD-10-CM | POA: Diagnosis not present

## 2019-08-10 DIAGNOSIS — D509 Iron deficiency anemia, unspecified: Secondary | ICD-10-CM | POA: Diagnosis not present

## 2019-08-10 DIAGNOSIS — N2581 Secondary hyperparathyroidism of renal origin: Secondary | ICD-10-CM | POA: Diagnosis not present

## 2019-08-10 DIAGNOSIS — D631 Anemia in chronic kidney disease: Secondary | ICD-10-CM | POA: Diagnosis not present

## 2019-08-10 DIAGNOSIS — Z992 Dependence on renal dialysis: Secondary | ICD-10-CM | POA: Diagnosis not present

## 2019-08-10 DIAGNOSIS — N186 End stage renal disease: Secondary | ICD-10-CM | POA: Diagnosis not present

## 2019-08-10 DIAGNOSIS — E1129 Type 2 diabetes mellitus with other diabetic kidney complication: Secondary | ICD-10-CM | POA: Diagnosis not present

## 2019-08-11 DIAGNOSIS — N186 End stage renal disease: Secondary | ICD-10-CM | POA: Diagnosis not present

## 2019-08-11 DIAGNOSIS — I10 Essential (primary) hypertension: Secondary | ICD-10-CM | POA: Diagnosis not present

## 2019-08-11 DIAGNOSIS — F209 Schizophrenia, unspecified: Secondary | ICD-10-CM | POA: Diagnosis not present

## 2019-08-11 DIAGNOSIS — E663 Overweight: Secondary | ICD-10-CM | POA: Diagnosis not present

## 2019-08-12 DIAGNOSIS — N186 End stage renal disease: Secondary | ICD-10-CM | POA: Diagnosis not present

## 2019-08-12 DIAGNOSIS — E1129 Type 2 diabetes mellitus with other diabetic kidney complication: Secondary | ICD-10-CM | POA: Diagnosis not present

## 2019-08-12 DIAGNOSIS — N2581 Secondary hyperparathyroidism of renal origin: Secondary | ICD-10-CM | POA: Diagnosis not present

## 2019-08-12 DIAGNOSIS — D631 Anemia in chronic kidney disease: Secondary | ICD-10-CM | POA: Diagnosis not present

## 2019-08-12 DIAGNOSIS — Z992 Dependence on renal dialysis: Secondary | ICD-10-CM | POA: Diagnosis not present

## 2019-08-12 DIAGNOSIS — D509 Iron deficiency anemia, unspecified: Secondary | ICD-10-CM | POA: Diagnosis not present

## 2019-08-14 DIAGNOSIS — D631 Anemia in chronic kidney disease: Secondary | ICD-10-CM | POA: Diagnosis not present

## 2019-08-14 DIAGNOSIS — N186 End stage renal disease: Secondary | ICD-10-CM | POA: Diagnosis not present

## 2019-08-14 DIAGNOSIS — Z992 Dependence on renal dialysis: Secondary | ICD-10-CM | POA: Diagnosis not present

## 2019-08-14 DIAGNOSIS — N2581 Secondary hyperparathyroidism of renal origin: Secondary | ICD-10-CM | POA: Diagnosis not present

## 2019-08-14 DIAGNOSIS — D509 Iron deficiency anemia, unspecified: Secondary | ICD-10-CM | POA: Diagnosis not present

## 2019-08-14 DIAGNOSIS — E1129 Type 2 diabetes mellitus with other diabetic kidney complication: Secondary | ICD-10-CM | POA: Diagnosis not present

## 2019-08-17 DIAGNOSIS — N186 End stage renal disease: Secondary | ICD-10-CM | POA: Diagnosis not present

## 2019-08-17 DIAGNOSIS — E1129 Type 2 diabetes mellitus with other diabetic kidney complication: Secondary | ICD-10-CM | POA: Diagnosis not present

## 2019-08-17 DIAGNOSIS — D509 Iron deficiency anemia, unspecified: Secondary | ICD-10-CM | POA: Diagnosis not present

## 2019-08-17 DIAGNOSIS — D631 Anemia in chronic kidney disease: Secondary | ICD-10-CM | POA: Diagnosis not present

## 2019-08-17 DIAGNOSIS — N2581 Secondary hyperparathyroidism of renal origin: Secondary | ICD-10-CM | POA: Diagnosis not present

## 2019-08-17 DIAGNOSIS — Z992 Dependence on renal dialysis: Secondary | ICD-10-CM | POA: Diagnosis not present

## 2019-08-19 DIAGNOSIS — D509 Iron deficiency anemia, unspecified: Secondary | ICD-10-CM | POA: Diagnosis not present

## 2019-08-19 DIAGNOSIS — D631 Anemia in chronic kidney disease: Secondary | ICD-10-CM | POA: Diagnosis not present

## 2019-08-19 DIAGNOSIS — N2581 Secondary hyperparathyroidism of renal origin: Secondary | ICD-10-CM | POA: Diagnosis not present

## 2019-08-19 DIAGNOSIS — N186 End stage renal disease: Secondary | ICD-10-CM | POA: Diagnosis not present

## 2019-08-19 DIAGNOSIS — Z992 Dependence on renal dialysis: Secondary | ICD-10-CM | POA: Diagnosis not present

## 2019-08-19 DIAGNOSIS — E1129 Type 2 diabetes mellitus with other diabetic kidney complication: Secondary | ICD-10-CM | POA: Diagnosis not present

## 2019-08-21 DIAGNOSIS — D631 Anemia in chronic kidney disease: Secondary | ICD-10-CM | POA: Diagnosis not present

## 2019-08-21 DIAGNOSIS — N2581 Secondary hyperparathyroidism of renal origin: Secondary | ICD-10-CM | POA: Diagnosis not present

## 2019-08-21 DIAGNOSIS — D509 Iron deficiency anemia, unspecified: Secondary | ICD-10-CM | POA: Diagnosis not present

## 2019-08-21 DIAGNOSIS — Z992 Dependence on renal dialysis: Secondary | ICD-10-CM | POA: Diagnosis not present

## 2019-08-21 DIAGNOSIS — E1129 Type 2 diabetes mellitus with other diabetic kidney complication: Secondary | ICD-10-CM | POA: Diagnosis not present

## 2019-08-21 DIAGNOSIS — N186 End stage renal disease: Secondary | ICD-10-CM | POA: Diagnosis not present

## 2019-08-24 DIAGNOSIS — N2581 Secondary hyperparathyroidism of renal origin: Secondary | ICD-10-CM | POA: Diagnosis not present

## 2019-08-24 DIAGNOSIS — Z992 Dependence on renal dialysis: Secondary | ICD-10-CM | POA: Diagnosis not present

## 2019-08-24 DIAGNOSIS — N186 End stage renal disease: Secondary | ICD-10-CM | POA: Diagnosis not present

## 2019-08-24 DIAGNOSIS — D631 Anemia in chronic kidney disease: Secondary | ICD-10-CM | POA: Diagnosis not present

## 2019-08-24 DIAGNOSIS — E1129 Type 2 diabetes mellitus with other diabetic kidney complication: Secondary | ICD-10-CM | POA: Diagnosis not present

## 2019-08-24 DIAGNOSIS — D509 Iron deficiency anemia, unspecified: Secondary | ICD-10-CM | POA: Diagnosis not present

## 2019-08-26 DIAGNOSIS — E1129 Type 2 diabetes mellitus with other diabetic kidney complication: Secondary | ICD-10-CM | POA: Diagnosis not present

## 2019-08-26 DIAGNOSIS — Z992 Dependence on renal dialysis: Secondary | ICD-10-CM | POA: Diagnosis not present

## 2019-08-26 DIAGNOSIS — N186 End stage renal disease: Secondary | ICD-10-CM | POA: Diagnosis not present

## 2019-08-26 DIAGNOSIS — D631 Anemia in chronic kidney disease: Secondary | ICD-10-CM | POA: Diagnosis not present

## 2019-08-26 DIAGNOSIS — N2581 Secondary hyperparathyroidism of renal origin: Secondary | ICD-10-CM | POA: Diagnosis not present

## 2019-08-26 DIAGNOSIS — D509 Iron deficiency anemia, unspecified: Secondary | ICD-10-CM | POA: Diagnosis not present

## 2019-08-28 DIAGNOSIS — D631 Anemia in chronic kidney disease: Secondary | ICD-10-CM | POA: Diagnosis not present

## 2019-08-28 DIAGNOSIS — Z992 Dependence on renal dialysis: Secondary | ICD-10-CM | POA: Diagnosis not present

## 2019-08-28 DIAGNOSIS — N186 End stage renal disease: Secondary | ICD-10-CM | POA: Diagnosis not present

## 2019-08-28 DIAGNOSIS — D509 Iron deficiency anemia, unspecified: Secondary | ICD-10-CM | POA: Diagnosis not present

## 2019-08-28 DIAGNOSIS — E1129 Type 2 diabetes mellitus with other diabetic kidney complication: Secondary | ICD-10-CM | POA: Diagnosis not present

## 2019-08-28 DIAGNOSIS — N2581 Secondary hyperparathyroidism of renal origin: Secondary | ICD-10-CM | POA: Diagnosis not present

## 2019-08-29 DIAGNOSIS — Z992 Dependence on renal dialysis: Secondary | ICD-10-CM | POA: Diagnosis not present

## 2019-08-29 DIAGNOSIS — N032 Chronic nephritic syndrome with diffuse membranous glomerulonephritis: Secondary | ICD-10-CM | POA: Diagnosis not present

## 2019-08-29 DIAGNOSIS — N186 End stage renal disease: Secondary | ICD-10-CM | POA: Diagnosis not present

## 2019-08-31 DIAGNOSIS — N2581 Secondary hyperparathyroidism of renal origin: Secondary | ICD-10-CM | POA: Diagnosis not present

## 2019-08-31 DIAGNOSIS — N186 End stage renal disease: Secondary | ICD-10-CM | POA: Diagnosis not present

## 2019-08-31 DIAGNOSIS — Z992 Dependence on renal dialysis: Secondary | ICD-10-CM | POA: Diagnosis not present

## 2019-08-31 DIAGNOSIS — D509 Iron deficiency anemia, unspecified: Secondary | ICD-10-CM | POA: Diagnosis not present

## 2019-08-31 DIAGNOSIS — E1129 Type 2 diabetes mellitus with other diabetic kidney complication: Secondary | ICD-10-CM | POA: Diagnosis not present

## 2019-09-02 DIAGNOSIS — D509 Iron deficiency anemia, unspecified: Secondary | ICD-10-CM | POA: Diagnosis not present

## 2019-09-02 DIAGNOSIS — N186 End stage renal disease: Secondary | ICD-10-CM | POA: Diagnosis not present

## 2019-09-02 DIAGNOSIS — N2581 Secondary hyperparathyroidism of renal origin: Secondary | ICD-10-CM | POA: Diagnosis not present

## 2019-09-02 DIAGNOSIS — E1129 Type 2 diabetes mellitus with other diabetic kidney complication: Secondary | ICD-10-CM | POA: Diagnosis not present

## 2019-09-02 DIAGNOSIS — Z992 Dependence on renal dialysis: Secondary | ICD-10-CM | POA: Diagnosis not present

## 2019-09-04 DIAGNOSIS — D509 Iron deficiency anemia, unspecified: Secondary | ICD-10-CM | POA: Diagnosis not present

## 2019-09-04 DIAGNOSIS — N2581 Secondary hyperparathyroidism of renal origin: Secondary | ICD-10-CM | POA: Diagnosis not present

## 2019-09-04 DIAGNOSIS — Z992 Dependence on renal dialysis: Secondary | ICD-10-CM | POA: Diagnosis not present

## 2019-09-04 DIAGNOSIS — N186 End stage renal disease: Secondary | ICD-10-CM | POA: Diagnosis not present

## 2019-09-04 DIAGNOSIS — E1129 Type 2 diabetes mellitus with other diabetic kidney complication: Secondary | ICD-10-CM | POA: Diagnosis not present

## 2019-09-07 DIAGNOSIS — N2581 Secondary hyperparathyroidism of renal origin: Secondary | ICD-10-CM | POA: Diagnosis not present

## 2019-09-07 DIAGNOSIS — N186 End stage renal disease: Secondary | ICD-10-CM | POA: Diagnosis not present

## 2019-09-07 DIAGNOSIS — D509 Iron deficiency anemia, unspecified: Secondary | ICD-10-CM | POA: Diagnosis not present

## 2019-09-07 DIAGNOSIS — E1129 Type 2 diabetes mellitus with other diabetic kidney complication: Secondary | ICD-10-CM | POA: Diagnosis not present

## 2019-09-07 DIAGNOSIS — Z992 Dependence on renal dialysis: Secondary | ICD-10-CM | POA: Diagnosis not present

## 2019-09-09 DIAGNOSIS — N2581 Secondary hyperparathyroidism of renal origin: Secondary | ICD-10-CM | POA: Diagnosis not present

## 2019-09-09 DIAGNOSIS — N186 End stage renal disease: Secondary | ICD-10-CM | POA: Diagnosis not present

## 2019-09-09 DIAGNOSIS — D509 Iron deficiency anemia, unspecified: Secondary | ICD-10-CM | POA: Diagnosis not present

## 2019-09-09 DIAGNOSIS — R05 Cough: Secondary | ICD-10-CM | POA: Diagnosis not present

## 2019-09-09 DIAGNOSIS — E1129 Type 2 diabetes mellitus with other diabetic kidney complication: Secondary | ICD-10-CM | POA: Diagnosis not present

## 2019-09-09 DIAGNOSIS — Z20828 Contact with and (suspected) exposure to other viral communicable diseases: Secondary | ICD-10-CM | POA: Diagnosis not present

## 2019-09-09 DIAGNOSIS — Z992 Dependence on renal dialysis: Secondary | ICD-10-CM | POA: Diagnosis not present

## 2019-09-11 DIAGNOSIS — N2581 Secondary hyperparathyroidism of renal origin: Secondary | ICD-10-CM | POA: Diagnosis not present

## 2019-09-11 DIAGNOSIS — E1129 Type 2 diabetes mellitus with other diabetic kidney complication: Secondary | ICD-10-CM | POA: Diagnosis not present

## 2019-09-11 DIAGNOSIS — N186 End stage renal disease: Secondary | ICD-10-CM | POA: Diagnosis not present

## 2019-09-11 DIAGNOSIS — Z992 Dependence on renal dialysis: Secondary | ICD-10-CM | POA: Diagnosis not present

## 2019-09-11 DIAGNOSIS — D509 Iron deficiency anemia, unspecified: Secondary | ICD-10-CM | POA: Diagnosis not present

## 2019-09-14 DIAGNOSIS — Z992 Dependence on renal dialysis: Secondary | ICD-10-CM | POA: Diagnosis not present

## 2019-09-14 DIAGNOSIS — E1129 Type 2 diabetes mellitus with other diabetic kidney complication: Secondary | ICD-10-CM | POA: Diagnosis not present

## 2019-09-14 DIAGNOSIS — D509 Iron deficiency anemia, unspecified: Secondary | ICD-10-CM | POA: Diagnosis not present

## 2019-09-14 DIAGNOSIS — N186 End stage renal disease: Secondary | ICD-10-CM | POA: Diagnosis not present

## 2019-09-14 DIAGNOSIS — N2581 Secondary hyperparathyroidism of renal origin: Secondary | ICD-10-CM | POA: Diagnosis not present

## 2019-09-15 DIAGNOSIS — I10 Essential (primary) hypertension: Secondary | ICD-10-CM | POA: Diagnosis not present

## 2019-09-15 DIAGNOSIS — Z992 Dependence on renal dialysis: Secondary | ICD-10-CM | POA: Diagnosis not present

## 2019-09-15 DIAGNOSIS — F209 Schizophrenia, unspecified: Secondary | ICD-10-CM | POA: Diagnosis not present

## 2019-09-16 DIAGNOSIS — Z992 Dependence on renal dialysis: Secondary | ICD-10-CM | POA: Diagnosis not present

## 2019-09-16 DIAGNOSIS — D509 Iron deficiency anemia, unspecified: Secondary | ICD-10-CM | POA: Diagnosis not present

## 2019-09-16 DIAGNOSIS — E1129 Type 2 diabetes mellitus with other diabetic kidney complication: Secondary | ICD-10-CM | POA: Diagnosis not present

## 2019-09-16 DIAGNOSIS — N2581 Secondary hyperparathyroidism of renal origin: Secondary | ICD-10-CM | POA: Diagnosis not present

## 2019-09-16 DIAGNOSIS — N186 End stage renal disease: Secondary | ICD-10-CM | POA: Diagnosis not present

## 2019-09-18 DIAGNOSIS — N2581 Secondary hyperparathyroidism of renal origin: Secondary | ICD-10-CM | POA: Diagnosis not present

## 2019-09-18 DIAGNOSIS — E1129 Type 2 diabetes mellitus with other diabetic kidney complication: Secondary | ICD-10-CM | POA: Diagnosis not present

## 2019-09-18 DIAGNOSIS — D509 Iron deficiency anemia, unspecified: Secondary | ICD-10-CM | POA: Diagnosis not present

## 2019-09-18 DIAGNOSIS — N186 End stage renal disease: Secondary | ICD-10-CM | POA: Diagnosis not present

## 2019-09-18 DIAGNOSIS — Z992 Dependence on renal dialysis: Secondary | ICD-10-CM | POA: Diagnosis not present

## 2019-09-20 DIAGNOSIS — Z992 Dependence on renal dialysis: Secondary | ICD-10-CM | POA: Diagnosis not present

## 2019-09-20 DIAGNOSIS — N186 End stage renal disease: Secondary | ICD-10-CM | POA: Diagnosis not present

## 2019-09-20 DIAGNOSIS — E1129 Type 2 diabetes mellitus with other diabetic kidney complication: Secondary | ICD-10-CM | POA: Diagnosis not present

## 2019-09-20 DIAGNOSIS — N2581 Secondary hyperparathyroidism of renal origin: Secondary | ICD-10-CM | POA: Diagnosis not present

## 2019-09-20 DIAGNOSIS — D509 Iron deficiency anemia, unspecified: Secondary | ICD-10-CM | POA: Diagnosis not present

## 2019-09-22 DIAGNOSIS — N186 End stage renal disease: Secondary | ICD-10-CM | POA: Diagnosis not present

## 2019-09-22 DIAGNOSIS — N2581 Secondary hyperparathyroidism of renal origin: Secondary | ICD-10-CM | POA: Diagnosis not present

## 2019-09-22 DIAGNOSIS — E1129 Type 2 diabetes mellitus with other diabetic kidney complication: Secondary | ICD-10-CM | POA: Diagnosis not present

## 2019-09-22 DIAGNOSIS — Z992 Dependence on renal dialysis: Secondary | ICD-10-CM | POA: Diagnosis not present

## 2019-09-22 DIAGNOSIS — D509 Iron deficiency anemia, unspecified: Secondary | ICD-10-CM | POA: Diagnosis not present

## 2019-09-25 DIAGNOSIS — Z992 Dependence on renal dialysis: Secondary | ICD-10-CM | POA: Diagnosis not present

## 2019-09-25 DIAGNOSIS — N2581 Secondary hyperparathyroidism of renal origin: Secondary | ICD-10-CM | POA: Diagnosis not present

## 2019-09-25 DIAGNOSIS — D509 Iron deficiency anemia, unspecified: Secondary | ICD-10-CM | POA: Diagnosis not present

## 2019-09-25 DIAGNOSIS — N186 End stage renal disease: Secondary | ICD-10-CM | POA: Diagnosis not present

## 2019-09-25 DIAGNOSIS — E1129 Type 2 diabetes mellitus with other diabetic kidney complication: Secondary | ICD-10-CM | POA: Diagnosis not present

## 2019-09-27 DIAGNOSIS — Z20828 Contact with and (suspected) exposure to other viral communicable diseases: Secondary | ICD-10-CM | POA: Diagnosis not present

## 2019-09-27 DIAGNOSIS — R05 Cough: Secondary | ICD-10-CM | POA: Diagnosis not present

## 2019-09-28 DIAGNOSIS — Z992 Dependence on renal dialysis: Secondary | ICD-10-CM | POA: Diagnosis not present

## 2019-09-28 DIAGNOSIS — N2581 Secondary hyperparathyroidism of renal origin: Secondary | ICD-10-CM | POA: Diagnosis not present

## 2019-09-28 DIAGNOSIS — E1129 Type 2 diabetes mellitus with other diabetic kidney complication: Secondary | ICD-10-CM | POA: Diagnosis not present

## 2019-09-28 DIAGNOSIS — D631 Anemia in chronic kidney disease: Secondary | ICD-10-CM | POA: Diagnosis not present

## 2019-09-28 DIAGNOSIS — N186 End stage renal disease: Secondary | ICD-10-CM | POA: Diagnosis not present

## 2019-09-28 DIAGNOSIS — N032 Chronic nephritic syndrome with diffuse membranous glomerulonephritis: Secondary | ICD-10-CM | POA: Diagnosis not present

## 2019-09-28 DIAGNOSIS — D509 Iron deficiency anemia, unspecified: Secondary | ICD-10-CM | POA: Diagnosis not present

## 2019-09-30 DIAGNOSIS — D631 Anemia in chronic kidney disease: Secondary | ICD-10-CM | POA: Diagnosis not present

## 2019-09-30 DIAGNOSIS — N186 End stage renal disease: Secondary | ICD-10-CM | POA: Diagnosis not present

## 2019-09-30 DIAGNOSIS — D509 Iron deficiency anemia, unspecified: Secondary | ICD-10-CM | POA: Diagnosis not present

## 2019-09-30 DIAGNOSIS — E1129 Type 2 diabetes mellitus with other diabetic kidney complication: Secondary | ICD-10-CM | POA: Diagnosis not present

## 2019-09-30 DIAGNOSIS — N2581 Secondary hyperparathyroidism of renal origin: Secondary | ICD-10-CM | POA: Diagnosis not present

## 2019-09-30 DIAGNOSIS — Z992 Dependence on renal dialysis: Secondary | ICD-10-CM | POA: Diagnosis not present

## 2019-10-02 DIAGNOSIS — D509 Iron deficiency anemia, unspecified: Secondary | ICD-10-CM | POA: Diagnosis not present

## 2019-10-02 DIAGNOSIS — Z992 Dependence on renal dialysis: Secondary | ICD-10-CM | POA: Diagnosis not present

## 2019-10-02 DIAGNOSIS — D631 Anemia in chronic kidney disease: Secondary | ICD-10-CM | POA: Diagnosis not present

## 2019-10-02 DIAGNOSIS — N186 End stage renal disease: Secondary | ICD-10-CM | POA: Diagnosis not present

## 2019-10-02 DIAGNOSIS — E1129 Type 2 diabetes mellitus with other diabetic kidney complication: Secondary | ICD-10-CM | POA: Diagnosis not present

## 2019-10-02 DIAGNOSIS — N2581 Secondary hyperparathyroidism of renal origin: Secondary | ICD-10-CM | POA: Diagnosis not present

## 2019-10-05 ENCOUNTER — Telehealth: Payer: Self-pay | Admitting: Emergency Medicine

## 2019-10-05 DIAGNOSIS — E1129 Type 2 diabetes mellitus with other diabetic kidney complication: Secondary | ICD-10-CM | POA: Diagnosis not present

## 2019-10-05 DIAGNOSIS — Z992 Dependence on renal dialysis: Secondary | ICD-10-CM | POA: Diagnosis not present

## 2019-10-05 DIAGNOSIS — N186 End stage renal disease: Secondary | ICD-10-CM | POA: Diagnosis not present

## 2019-10-05 DIAGNOSIS — D631 Anemia in chronic kidney disease: Secondary | ICD-10-CM | POA: Diagnosis not present

## 2019-10-05 DIAGNOSIS — N2581 Secondary hyperparathyroidism of renal origin: Secondary | ICD-10-CM | POA: Diagnosis not present

## 2019-10-05 DIAGNOSIS — D509 Iron deficiency anemia, unspecified: Secondary | ICD-10-CM | POA: Diagnosis not present

## 2019-10-05 NOTE — Telephone Encounter (Signed)
Will consider schedule  patient for follow-up with Dr Lovena Le due to remote transmission received on 10/05/19. Last in clinic visit 05/26/18.

## 2019-10-07 DIAGNOSIS — N2581 Secondary hyperparathyroidism of renal origin: Secondary | ICD-10-CM | POA: Diagnosis not present

## 2019-10-07 DIAGNOSIS — D509 Iron deficiency anemia, unspecified: Secondary | ICD-10-CM | POA: Diagnosis not present

## 2019-10-07 DIAGNOSIS — Z992 Dependence on renal dialysis: Secondary | ICD-10-CM | POA: Diagnosis not present

## 2019-10-07 DIAGNOSIS — N186 End stage renal disease: Secondary | ICD-10-CM | POA: Diagnosis not present

## 2019-10-07 DIAGNOSIS — D631 Anemia in chronic kidney disease: Secondary | ICD-10-CM | POA: Diagnosis not present

## 2019-10-07 DIAGNOSIS — E1129 Type 2 diabetes mellitus with other diabetic kidney complication: Secondary | ICD-10-CM | POA: Diagnosis not present

## 2019-10-09 DIAGNOSIS — E1129 Type 2 diabetes mellitus with other diabetic kidney complication: Secondary | ICD-10-CM | POA: Diagnosis not present

## 2019-10-09 DIAGNOSIS — N186 End stage renal disease: Secondary | ICD-10-CM | POA: Diagnosis not present

## 2019-10-09 DIAGNOSIS — D509 Iron deficiency anemia, unspecified: Secondary | ICD-10-CM | POA: Diagnosis not present

## 2019-10-09 DIAGNOSIS — Z992 Dependence on renal dialysis: Secondary | ICD-10-CM | POA: Diagnosis not present

## 2019-10-09 DIAGNOSIS — N2581 Secondary hyperparathyroidism of renal origin: Secondary | ICD-10-CM | POA: Diagnosis not present

## 2019-10-09 DIAGNOSIS — D631 Anemia in chronic kidney disease: Secondary | ICD-10-CM | POA: Diagnosis not present

## 2019-10-10 NOTE — Telephone Encounter (Signed)
He can followup in 3 months.

## 2019-10-11 ENCOUNTER — Encounter: Payer: Self-pay | Admitting: Internal Medicine

## 2019-10-11 DIAGNOSIS — I10 Essential (primary) hypertension: Secondary | ICD-10-CM | POA: Diagnosis not present

## 2019-10-11 DIAGNOSIS — F209 Schizophrenia, unspecified: Secondary | ICD-10-CM | POA: Diagnosis not present

## 2019-10-11 DIAGNOSIS — Z992 Dependence on renal dialysis: Secondary | ICD-10-CM | POA: Diagnosis not present

## 2019-10-11 DIAGNOSIS — N186 End stage renal disease: Secondary | ICD-10-CM | POA: Diagnosis not present

## 2019-10-12 DIAGNOSIS — E1129 Type 2 diabetes mellitus with other diabetic kidney complication: Secondary | ICD-10-CM | POA: Diagnosis not present

## 2019-10-12 DIAGNOSIS — N2581 Secondary hyperparathyroidism of renal origin: Secondary | ICD-10-CM | POA: Diagnosis not present

## 2019-10-12 DIAGNOSIS — Z992 Dependence on renal dialysis: Secondary | ICD-10-CM | POA: Diagnosis not present

## 2019-10-12 DIAGNOSIS — D631 Anemia in chronic kidney disease: Secondary | ICD-10-CM | POA: Diagnosis not present

## 2019-10-12 DIAGNOSIS — D509 Iron deficiency anemia, unspecified: Secondary | ICD-10-CM | POA: Diagnosis not present

## 2019-10-12 DIAGNOSIS — N186 End stage renal disease: Secondary | ICD-10-CM | POA: Diagnosis not present

## 2019-10-14 DIAGNOSIS — D631 Anemia in chronic kidney disease: Secondary | ICD-10-CM | POA: Diagnosis not present

## 2019-10-14 DIAGNOSIS — D509 Iron deficiency anemia, unspecified: Secondary | ICD-10-CM | POA: Diagnosis not present

## 2019-10-14 DIAGNOSIS — Z992 Dependence on renal dialysis: Secondary | ICD-10-CM | POA: Diagnosis not present

## 2019-10-14 DIAGNOSIS — E1129 Type 2 diabetes mellitus with other diabetic kidney complication: Secondary | ICD-10-CM | POA: Diagnosis not present

## 2019-10-14 DIAGNOSIS — N186 End stage renal disease: Secondary | ICD-10-CM | POA: Diagnosis not present

## 2019-10-14 DIAGNOSIS — N2581 Secondary hyperparathyroidism of renal origin: Secondary | ICD-10-CM | POA: Diagnosis not present

## 2019-10-16 DIAGNOSIS — Z992 Dependence on renal dialysis: Secondary | ICD-10-CM | POA: Diagnosis not present

## 2019-10-16 DIAGNOSIS — N186 End stage renal disease: Secondary | ICD-10-CM | POA: Diagnosis not present

## 2019-10-16 DIAGNOSIS — N2581 Secondary hyperparathyroidism of renal origin: Secondary | ICD-10-CM | POA: Diagnosis not present

## 2019-10-16 DIAGNOSIS — D509 Iron deficiency anemia, unspecified: Secondary | ICD-10-CM | POA: Diagnosis not present

## 2019-10-16 DIAGNOSIS — D631 Anemia in chronic kidney disease: Secondary | ICD-10-CM | POA: Diagnosis not present

## 2019-10-16 DIAGNOSIS — E1129 Type 2 diabetes mellitus with other diabetic kidney complication: Secondary | ICD-10-CM | POA: Diagnosis not present

## 2019-10-18 ENCOUNTER — Telehealth: Payer: Self-pay | Admitting: Internal Medicine

## 2019-10-18 NOTE — Telephone Encounter (Signed)
Spoke with Velcro. He states patient told him device is beeping every 4 hours, though he has not heard it himself. Patient is going back to his group home tomorrow after dialysis. He dialyzes from 10:00-16:00, unable to come into the office tomorrow per Velcro. Velcro requests that I contact Chris at group home tomorrow to request an ICD transmission. No further questions at this time.   Reviewed previous transmission from 10/05/19--CareAlert for RV bipolar impedance of >3000ohms noted on 10/04/19 at 21:00. RV pacing impedance back to 513ohms at time of transmission on 12/8, RV threshold chronically elevated, HV impedance trend stable. Also noted 7 TWOS episodes, all from 01/2019.  Called Velcro back, explained importance of f/u in office to assess RV lead. Velcro states there is no way pt can come into the office tomorrow (before or after dialysis). Requests that I call Gerald Stabs tomorrow to schedule appointment. Advised that patient should be assessed in ED if any ICD shocks overnight. Velcro verbalizes understanding and denies questions at this time.

## 2019-10-18 NOTE — Telephone Encounter (Signed)
I asked the pt to send a manual transmission but the pt do not have his monitor with him. I asked was there anyway possible that the pt was near a magnet when he heard the ICD and he states No. I asked him can he go get the pt monitor. He states he can bring the pt here. I told him we are all booked in the device clinic. He is insisting on bringing the pt here. I told him I will have someone call him back.

## 2019-10-18 NOTE — Telephone Encounter (Signed)
  1. Has your device fired? No  2. Is you device beeping? Yes  3. Are you experiencing draining or swelling at device site? No  4. Are you calling to see if we received your device transmission? No  5. Have you passed out? No    Please route to [place]

## 2019-10-19 DIAGNOSIS — E1129 Type 2 diabetes mellitus with other diabetic kidney complication: Secondary | ICD-10-CM | POA: Diagnosis not present

## 2019-10-19 DIAGNOSIS — N2581 Secondary hyperparathyroidism of renal origin: Secondary | ICD-10-CM | POA: Diagnosis not present

## 2019-10-19 DIAGNOSIS — N186 End stage renal disease: Secondary | ICD-10-CM | POA: Diagnosis not present

## 2019-10-19 DIAGNOSIS — Z992 Dependence on renal dialysis: Secondary | ICD-10-CM | POA: Diagnosis not present

## 2019-10-19 DIAGNOSIS — D509 Iron deficiency anemia, unspecified: Secondary | ICD-10-CM | POA: Diagnosis not present

## 2019-10-19 DIAGNOSIS — D631 Anemia in chronic kidney disease: Secondary | ICD-10-CM | POA: Diagnosis not present

## 2019-10-19 NOTE — Telephone Encounter (Signed)
Per Dr. Lovena Le, patient should follow-up on 12/24 if possible. Attempted to reach [place], no answer, VM not setup.

## 2019-10-19 NOTE — Telephone Encounter (Signed)
Second attempt:  Durward Mallard, no answer, no VM setup. Attempted number listed on DPR for "[facility]" [phone]). Number is no longer in service.

## 2019-10-20 NOTE — Telephone Encounter (Addendum)
Third attempt:  Attempted to reach patient/James Nicholson at numbers on file. No answer, unable to LM.  LMOVM for Velcro (EC), patient's brother, requesting call back to [place]. Direct number provided.

## 2019-10-21 DIAGNOSIS — Z992 Dependence on renal dialysis: Secondary | ICD-10-CM | POA: Diagnosis not present

## 2019-10-21 DIAGNOSIS — D509 Iron deficiency anemia, unspecified: Secondary | ICD-10-CM | POA: Diagnosis not present

## 2019-10-21 DIAGNOSIS — E1129 Type 2 diabetes mellitus with other diabetic kidney complication: Secondary | ICD-10-CM | POA: Diagnosis not present

## 2019-10-21 DIAGNOSIS — D631 Anemia in chronic kidney disease: Secondary | ICD-10-CM | POA: Diagnosis not present

## 2019-10-21 DIAGNOSIS — N2581 Secondary hyperparathyroidism of renal origin: Secondary | ICD-10-CM | POA: Diagnosis not present

## 2019-10-21 DIAGNOSIS — N186 End stage renal disease: Secondary | ICD-10-CM | POA: Diagnosis not present

## 2019-10-24 DIAGNOSIS — Z992 Dependence on renal dialysis: Secondary | ICD-10-CM | POA: Diagnosis not present

## 2019-10-24 DIAGNOSIS — D631 Anemia in chronic kidney disease: Secondary | ICD-10-CM | POA: Diagnosis not present

## 2019-10-24 DIAGNOSIS — D509 Iron deficiency anemia, unspecified: Secondary | ICD-10-CM | POA: Diagnosis not present

## 2019-10-24 DIAGNOSIS — E1129 Type 2 diabetes mellitus with other diabetic kidney complication: Secondary | ICD-10-CM | POA: Diagnosis not present

## 2019-10-24 DIAGNOSIS — N186 End stage renal disease: Secondary | ICD-10-CM | POA: Diagnosis not present

## 2019-10-24 DIAGNOSIS — N2581 Secondary hyperparathyroidism of renal origin: Secondary | ICD-10-CM | POA: Diagnosis not present

## 2019-10-25 DIAGNOSIS — Z20828 Contact with and (suspected) exposure to other viral communicable diseases: Secondary | ICD-10-CM | POA: Diagnosis not present

## 2019-10-26 DIAGNOSIS — D509 Iron deficiency anemia, unspecified: Secondary | ICD-10-CM | POA: Diagnosis not present

## 2019-10-26 DIAGNOSIS — D631 Anemia in chronic kidney disease: Secondary | ICD-10-CM | POA: Diagnosis not present

## 2019-10-26 DIAGNOSIS — E1129 Type 2 diabetes mellitus with other diabetic kidney complication: Secondary | ICD-10-CM | POA: Diagnosis not present

## 2019-10-26 DIAGNOSIS — N186 End stage renal disease: Secondary | ICD-10-CM | POA: Diagnosis not present

## 2019-10-26 DIAGNOSIS — Z992 Dependence on renal dialysis: Secondary | ICD-10-CM | POA: Diagnosis not present

## 2019-10-26 DIAGNOSIS — N2581 Secondary hyperparathyroidism of renal origin: Secondary | ICD-10-CM | POA: Diagnosis not present

## 2019-10-28 DIAGNOSIS — D509 Iron deficiency anemia, unspecified: Secondary | ICD-10-CM | POA: Diagnosis not present

## 2019-10-28 DIAGNOSIS — D631 Anemia in chronic kidney disease: Secondary | ICD-10-CM | POA: Diagnosis not present

## 2019-10-28 DIAGNOSIS — Z992 Dependence on renal dialysis: Secondary | ICD-10-CM | POA: Diagnosis not present

## 2019-10-28 DIAGNOSIS — E1129 Type 2 diabetes mellitus with other diabetic kidney complication: Secondary | ICD-10-CM | POA: Diagnosis not present

## 2019-10-28 DIAGNOSIS — N186 End stage renal disease: Secondary | ICD-10-CM | POA: Diagnosis not present

## 2019-10-28 DIAGNOSIS — N2581 Secondary hyperparathyroidism of renal origin: Secondary | ICD-10-CM | POA: Diagnosis not present

## 2019-10-29 DIAGNOSIS — N186 End stage renal disease: Secondary | ICD-10-CM | POA: Diagnosis not present

## 2019-10-29 DIAGNOSIS — N032 Chronic nephritic syndrome with diffuse membranous glomerulonephritis: Secondary | ICD-10-CM | POA: Diagnosis not present

## 2019-10-29 DIAGNOSIS — Z992 Dependence on renal dialysis: Secondary | ICD-10-CM | POA: Diagnosis not present

## 2019-10-31 DIAGNOSIS — N2581 Secondary hyperparathyroidism of renal origin: Secondary | ICD-10-CM | POA: Diagnosis not present

## 2019-10-31 DIAGNOSIS — N186 End stage renal disease: Secondary | ICD-10-CM | POA: Diagnosis not present

## 2019-10-31 DIAGNOSIS — Z992 Dependence on renal dialysis: Secondary | ICD-10-CM | POA: Diagnosis not present

## 2019-10-31 DIAGNOSIS — E1129 Type 2 diabetes mellitus with other diabetic kidney complication: Secondary | ICD-10-CM | POA: Diagnosis not present

## 2019-10-31 DIAGNOSIS — D509 Iron deficiency anemia, unspecified: Secondary | ICD-10-CM | POA: Diagnosis not present

## 2019-10-31 NOTE — Progress Notes (Deleted)
Cardiology Office Note Date:  10/31/2019  Patient ID:  James Nicholson, James Nicholson [birth year], MRN [id number] PCP:  [name], MD  Electrophysiologist:  Dr. Lovena Le    Chief Complaint: *** device beeping  History of Present Illness: James Nicholson is a 54 y.o. male with history of resuscitated VF arrest w/ICD, HTN, HLD, CM of unknown etiology, chronic CHF (systolic), schizophrenia, ESRF on HD (Tues/Thurs/Sat).  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  He comes in today to be seen for Dr. Lovena Le, last seen by him July 2019.  He noted some elevated pacing impedence bipolar and he has been reprogrammed tip to coil. Also his sensing is down a bit but appears to be normal in terms of no evidence of undersensing.  *** device alert *** known high RV thresholds *** symptoms *** volume status *** meds  Device information: MDT single chamber ICD, implanted 02/09/09, secondary prevention >> gen change 12/05/2017   Past Medical History:  Diagnosis Date  . Anemia   . Atrial fibrillation   . Cardiomyopathy ([place]) 2010   Unclear Etiology: Last Echo 06/2009: EF 40-45%, severe Lateral & apical Hypokinesis (? CAD) ; Grade 2 DDysfxn. Mild conc LVH.   Marland Kitchen Cellulitis   . Chronic kidney disease (CKD), stage V ([place])    Dialysis Tue, Thurs, Sat  . Dyslipidemia   . Enterobacter sepsis ([place])   . Hypertension   . IDDM (insulin dependent diabetes mellitus) ([place]) 11/03/2014  . S/P ICD (internal cardiac defibrillator) procedure 2010   VT Arrest (in [place])  . Schizophrenia ([place])   . Wegener's disease, pulmonary ([place]) 02/05/2013  . Wegener's granulomatosis ([place])     Past Surgical History:  Procedure Laterality Date  . AV FISTULA PLACEMENT Right 02/10/2013   Procedure: ARTERIOVENOUS (AV) FISTULA CREATION;  Surgeon: Rosetta Posner, MD;  Location: [facility] OR;  Service: Vascular;  Laterality: Right;  Right forearm  radial/cephalic arterovenous fistula.   Marland Kitchen CARDIAC CATHETERIZATION  2010   [place]: In setting of VT arrest. Per brother's report, nonobstructive with no intervention  . CARDIAC DEFIBRILLATOR PLACEMENT  2010   [place]  . FISTULOGRAM Right 08/09/2013   Procedure: FISTULOGRAM;  Surgeon: Angelia Mould, MD;  Location: [facility] CATH LAB;  Service: Cardiovascular;  Laterality: Right;  . ICD GENERATOR CHANGEOUT N/A 12/05/2017   Procedure: ICD GENERATOR CHANGEOUT;  Surgeon: Evans Lance, MD;  Location: [place] CV LAB;  Service: Cardiovascular;  Laterality: N/A;  . LIGATION OF COMPETING BRANCHES OF ARTERIOVENOUS FISTULA Right 08/13/2013   Procedure: LIGATION OF COMPETING BRANCHES X5 OF ARTERIOVENOUS FISTULA- RIGHT ARM;  Surgeon: Serafina Mitchell, MD;  Location: [facility] OR;  Service: Vascular;  Laterality: Right;    Current Outpatient Medications  Medication Sig Dispense Refill  . calcitRIOL (ROCALTROL) 0.5 MCG capsule Take 2 capsules (1 mcg total) by mouth every Monday, Wednesday, and Friday with hemodialysis. 24 capsule 0  . calcium acetate (PHOSLO) 667 MG capsule Take 2 capsules (1,334 mg total) by mouth 3 (three) times daily with meals. 8a, 12p, 5p 180 capsule 0  . gabapentin (NEURONTIN) 300 MG capsule Take 1 capsule (300 mg total) by mouth 2 (two) times daily. 60 capsule 0  . insulin aspart (NOVOLOG FLEXPEN) 100 UNIT/ML FlexPen Inject 0-5 Units into the skin 3 (three) times daily with meals. 201-250 = 2 units, 251-300 = 3 units, 301-350 = 4 units, 351-400 = 5 units (Patient not taking: Reported on  12/16/2018) 15 mL 0  . Insulin Detemir (LEVEMIR) 100 UNIT/ML Pen Inject 5 Units into the skin at bedtime.    . Insulin Pen Needle 32G X 5 MM MISC 1 each by Does not apply route daily as needed. 100 each 0  . ipratropium-albuterol (DUONEB) 0.5-2.5 (3) MG/3ML SOLN Take 3 mLs by nebulization every 4 (four) hours as needed for up to 30 days. 360 mL 0  . metoprolol succinate (TOPROL-XL) 50 MG 24 hr tablet Take  50 mg by mouth daily.    Marland Kitchen omeprazole (PRILOSEC) 20 MG capsule Take 20 mg by mouth every morning.     Marland Kitchen oxyCODONE-acetaminophen (PERCOCET) 5-325 MG tablet Take 1 tablet by mouth every 8 (eight) hours as needed. 20 tablet 0  . risperiDONE (RISPERDAL) 0.25 MG tablet Take 1 tablet (0.25 mg total) by mouth at bedtime. 30 tablet 0  . traZODone (DESYREL) 100 MG tablet Take 1 tablet (100 mg total) by mouth at bedtime. 30 tablet 0   No current facility-administered medications for this visit.    Allergies:   Patient has no known allergies.   Social History:  The patient  reports that he quit smoking about 11 years ago. He has never used smokeless tobacco. He reports that he does not drink alcohol or use drugs.   Family History:  The patient's family history includes CAD in his mother.  ROS:  Please see the history of present illness.    All other systems are reviewed and otherwise negative.   PHYSICAL EXAM:  VS:  There were no vitals taken for this visit. BMI: There is no height or weight on file to calculate BMI. Well nourished, well developed, in no acute distress  HEENT: normocephalic, atraumatic  Neck: no JVD, carotid bruits or masses Cardiac:  *** RRR; no significant murmurs, no rubs, or gallops Lungs:  *** CTA b/l, no wheezing, rhonchi or rales  Abd: soft, nontender MS: no deformity or *** atrophy Ext: *** no LE edema, AVF RUE/forearm Skin: warm and dry, no rash Neuro:  No gross deficits appreciated Psych: euthymic mood, full affect  *** ICD site is stable, no tethering or discomfort   EKG:  Done today and reviewed by myself *** ICD interrogation done today and reviewed by myself: ***    12/23/13: TTE Study Conclusions - Left ventricle: The cavity size was normal. Wall thickness was normal. Systolic function was mildly reduced. The estimated ejection fraction was in the range of 45% to 50%. Diffuse hypokinesis. Doppler parameters are consistent with abnormal left  ventricular relaxation (grade 1 diastolic dysfunction). - Mitral valve: Mild to moderate regurgitation.  Recent Labs: 01/05/2019: ALT 18; BUN 56; Creatinine, Ser 8.28; Hemoglobin 10.5; Platelets 138; Potassium 4.5; Sodium 134  No results found for requested labs within last 8760 hours.   CrCl cannot be calculated (Patient's most recent lab result is older than the maximum 21 days allowed.).   Wt Readings from Last 3 Encounters:  01/05/19 174 lb 2.6 oz (79 kg)  12/19/18 166 lb 3.6 oz (75.4 kg)  12/15/18 160 lb (72.6 kg)     Other studies reviewed: Additional studies/records reviewed today include: summarized above  ASSESSMENT AND PLAN:  1. ICD     ***  2. CM, chronic CHF (systolic)     *** No symptoms or exam findings to suggest fluid OL     *** OptiVol looks good     *** Fluid management with dialysis  3. HTN     *** Looks  OK,, no changes  4. ? Hx of AFib     *** CHA2DS2Vasc is 2     *** Unknown when this was noted, not on a/c     ***    Disposition:***    Current medicines are reviewed at length with the patient today.  The patient did not have any concerns regarding medicines.  Venetia Night, PA-C 10/31/2019 10:05 AM     [place] [address] [phone] (office)  [phone] (fax)

## 2019-11-01 ENCOUNTER — Encounter: Payer: Medicare Other | Admitting: Physician Assistant

## 2019-11-02 DIAGNOSIS — E1129 Type 2 diabetes mellitus with other diabetic kidney complication: Secondary | ICD-10-CM | POA: Diagnosis not present

## 2019-11-02 DIAGNOSIS — N186 End stage renal disease: Secondary | ICD-10-CM | POA: Diagnosis not present

## 2019-11-02 DIAGNOSIS — Z992 Dependence on renal dialysis: Secondary | ICD-10-CM | POA: Diagnosis not present

## 2019-11-02 DIAGNOSIS — D509 Iron deficiency anemia, unspecified: Secondary | ICD-10-CM | POA: Diagnosis not present

## 2019-11-02 DIAGNOSIS — N2581 Secondary hyperparathyroidism of renal origin: Secondary | ICD-10-CM | POA: Diagnosis not present

## 2019-11-04 DIAGNOSIS — E1129 Type 2 diabetes mellitus with other diabetic kidney complication: Secondary | ICD-10-CM | POA: Diagnosis not present

## 2019-11-04 DIAGNOSIS — Z992 Dependence on renal dialysis: Secondary | ICD-10-CM | POA: Diagnosis not present

## 2019-11-04 DIAGNOSIS — D509 Iron deficiency anemia, unspecified: Secondary | ICD-10-CM | POA: Diagnosis not present

## 2019-11-04 DIAGNOSIS — N186 End stage renal disease: Secondary | ICD-10-CM | POA: Diagnosis not present

## 2019-11-04 DIAGNOSIS — N2581 Secondary hyperparathyroidism of renal origin: Secondary | ICD-10-CM | POA: Diagnosis not present

## 2019-11-04 NOTE — Telephone Encounter (Signed)
Attempted to contact James Nicholson at group home who handles all appointments for the patient to stress importance of attending appointment.   Spoke with Velcro(brother per DPR ) and stressed importance of appointment. He is going to bring the patient to the appointment 11/24/19 at 1130.

## 2019-11-06 DIAGNOSIS — N186 End stage renal disease: Secondary | ICD-10-CM | POA: Diagnosis not present

## 2019-11-06 DIAGNOSIS — E1129 Type 2 diabetes mellitus with other diabetic kidney complication: Secondary | ICD-10-CM | POA: Diagnosis not present

## 2019-11-06 DIAGNOSIS — D509 Iron deficiency anemia, unspecified: Secondary | ICD-10-CM | POA: Diagnosis not present

## 2019-11-06 DIAGNOSIS — N2581 Secondary hyperparathyroidism of renal origin: Secondary | ICD-10-CM | POA: Diagnosis not present

## 2019-11-06 DIAGNOSIS — Z992 Dependence on renal dialysis: Secondary | ICD-10-CM | POA: Diagnosis not present

## 2019-11-09 DIAGNOSIS — Z992 Dependence on renal dialysis: Secondary | ICD-10-CM | POA: Diagnosis not present

## 2019-11-09 DIAGNOSIS — Z20828 Contact with and (suspected) exposure to other viral communicable diseases: Secondary | ICD-10-CM | POA: Diagnosis not present

## 2019-11-09 DIAGNOSIS — D509 Iron deficiency anemia, unspecified: Secondary | ICD-10-CM | POA: Diagnosis not present

## 2019-11-09 DIAGNOSIS — N2581 Secondary hyperparathyroidism of renal origin: Secondary | ICD-10-CM | POA: Diagnosis not present

## 2019-11-09 DIAGNOSIS — R05 Cough: Secondary | ICD-10-CM | POA: Diagnosis not present

## 2019-11-09 DIAGNOSIS — E1129 Type 2 diabetes mellitus with other diabetic kidney complication: Secondary | ICD-10-CM | POA: Diagnosis not present

## 2019-11-09 DIAGNOSIS — N186 End stage renal disease: Secondary | ICD-10-CM | POA: Diagnosis not present

## 2019-11-10 ENCOUNTER — Other Ambulatory Visit (HOSPITAL_BASED_OUTPATIENT_CLINIC_OR_DEPARTMENT_OTHER): Payer: Self-pay | Admitting: Nephrology

## 2019-11-10 ENCOUNTER — Other Ambulatory Visit: Payer: Self-pay

## 2019-11-10 ENCOUNTER — Ambulatory Visit (HOSPITAL_BASED_OUTPATIENT_CLINIC_OR_DEPARTMENT_OTHER)
Admission: RE | Admit: 2019-11-10 | Discharge: 2019-11-10 | Disposition: A | Discharge status: Home / Self Care | Payer: Medicare Other | Source: Ambulatory Visit | Attending: Nephrology | Admitting: Nephrology

## 2019-11-10 DIAGNOSIS — A150 Tuberculosis of lung: Secondary | ICD-10-CM | POA: Insufficient documentation

## 2019-11-10 DIAGNOSIS — Z20828 Contact with and (suspected) exposure to other viral communicable diseases: Secondary | ICD-10-CM | POA: Diagnosis not present

## 2019-11-10 DIAGNOSIS — R7611 Nonspecific reaction to tuberculin skin test without active tuberculosis: Secondary | ICD-10-CM | POA: Diagnosis not present

## 2019-11-11 DIAGNOSIS — Z992 Dependence on renal dialysis: Secondary | ICD-10-CM | POA: Diagnosis not present

## 2019-11-11 DIAGNOSIS — N186 End stage renal disease: Secondary | ICD-10-CM | POA: Diagnosis not present

## 2019-11-11 DIAGNOSIS — D509 Iron deficiency anemia, unspecified: Secondary | ICD-10-CM | POA: Diagnosis not present

## 2019-11-11 DIAGNOSIS — D631 Anemia in chronic kidney disease: Secondary | ICD-10-CM | POA: Diagnosis not present

## 2019-11-11 DIAGNOSIS — N2581 Secondary hyperparathyroidism of renal origin: Secondary | ICD-10-CM | POA: Diagnosis not present

## 2019-11-13 DIAGNOSIS — Z992 Dependence on renal dialysis: Secondary | ICD-10-CM | POA: Diagnosis not present

## 2019-11-13 DIAGNOSIS — D631 Anemia in chronic kidney disease: Secondary | ICD-10-CM | POA: Diagnosis not present

## 2019-11-13 DIAGNOSIS — D509 Iron deficiency anemia, unspecified: Secondary | ICD-10-CM | POA: Diagnosis not present

## 2019-11-13 DIAGNOSIS — N2581 Secondary hyperparathyroidism of renal origin: Secondary | ICD-10-CM | POA: Diagnosis not present

## 2019-11-13 DIAGNOSIS — N186 End stage renal disease: Secondary | ICD-10-CM | POA: Diagnosis not present

## 2019-11-16 DIAGNOSIS — D631 Anemia in chronic kidney disease: Secondary | ICD-10-CM | POA: Diagnosis not present

## 2019-11-16 DIAGNOSIS — Z992 Dependence on renal dialysis: Secondary | ICD-10-CM | POA: Diagnosis not present

## 2019-11-16 DIAGNOSIS — N2581 Secondary hyperparathyroidism of renal origin: Secondary | ICD-10-CM | POA: Diagnosis not present

## 2019-11-16 DIAGNOSIS — N186 End stage renal disease: Secondary | ICD-10-CM | POA: Diagnosis not present

## 2019-11-16 DIAGNOSIS — D509 Iron deficiency anemia, unspecified: Secondary | ICD-10-CM | POA: Diagnosis not present

## 2019-11-18 DIAGNOSIS — D509 Iron deficiency anemia, unspecified: Secondary | ICD-10-CM | POA: Diagnosis not present

## 2019-11-18 DIAGNOSIS — N2581 Secondary hyperparathyroidism of renal origin: Secondary | ICD-10-CM | POA: Diagnosis not present

## 2019-11-18 DIAGNOSIS — D631 Anemia in chronic kidney disease: Secondary | ICD-10-CM | POA: Diagnosis not present

## 2019-11-18 DIAGNOSIS — Z992 Dependence on renal dialysis: Secondary | ICD-10-CM | POA: Diagnosis not present

## 2019-11-18 DIAGNOSIS — N186 End stage renal disease: Secondary | ICD-10-CM | POA: Diagnosis not present

## 2019-11-20 DIAGNOSIS — Z992 Dependence on renal dialysis: Secondary | ICD-10-CM | POA: Diagnosis not present

## 2019-11-20 DIAGNOSIS — N2581 Secondary hyperparathyroidism of renal origin: Secondary | ICD-10-CM | POA: Diagnosis not present

## 2019-11-20 DIAGNOSIS — N186 End stage renal disease: Secondary | ICD-10-CM | POA: Diagnosis not present

## 2019-11-20 DIAGNOSIS — D631 Anemia in chronic kidney disease: Secondary | ICD-10-CM | POA: Diagnosis not present

## 2019-11-20 DIAGNOSIS — D509 Iron deficiency anemia, unspecified: Secondary | ICD-10-CM | POA: Diagnosis not present

## 2019-11-22 NOTE — Progress Notes (Signed)
Cardiology Office Note Date:  11/22/2019  Patient ID:  James Nicholson, James Nicholson 08/13/1966, MRN [id number] PCP:  [name], MD  Electrophysiologist:  Dr. Lovena Le    Chief Complaint: device beeping, over due visit  History of Present Illness: James Nicholson is a 54 y.o. male with history of resuscitated VF arrest w/ICD, HTN, HLD, CM of unknown etiology, chronic CHF (systolic), schizophrenia, ESRF on HD (Tues/Thurs/Sat).  Toady's visit in done with the assistance of a [place] Ecologist Nurse, mental health w/[employer]).  He comes in today to be seen for Dr. Lovena Le, last seen by him July 2019.  He noted some elevated pacing impedence bipolar and he has been reprogrammed tip to coil. Also mentioned "his sensing is down a bit but appears to be normal in terms of no evidence of undersensing"  He is accompanied today by his brother (along with the interpretor).  His brother mentions that he feels like his brother is over medicated with pain meds, antidepressants, that he tends to sleep a lot.  He reports he is now living with him and his wife in his home no longer at the group home.  Has been in communication with the patient's nephrologist and PMD.  He is likely going to change his PMD.  Aside from this, the patient reports that he feels OK.  He denies any CP, palpitations or cardiac awareness.  No SOB, no dizzy spells, near syncope or syncope.  No shocks. His brother concurs with this, has not mentions any symptoms to him   The device clinic reached out to intiate today's visit 2/2 lead impedance alert received also noted TWOS in the past and had him come in.  Device information: MDT single chamber ICD, implanted 02/09/09, secondary prevention >> gen change 12/05/2017   Past Medical History:  Diagnosis Date  . Anemia   . Atrial fibrillation   . Cardiomyopathy ([place]) 2010   Unclear Etiology: Last Echo 06/2009: EF 40-45%, severe Lateral & apical Hypokinesis (? CAD) ; Grade 2 DDysfxn.  Mild conc LVH.   Marland Kitchen Cellulitis   . Chronic kidney disease (CKD), stage V ([place])    Dialysis Tue, Thurs, Sat  . Dyslipidemia   . Enterobacter sepsis ([place])   . Hypertension   . IDDM (insulin dependent diabetes mellitus) ([place]) 11/03/2014  . S/P ICD (internal cardiac defibrillator) procedure 2010   VT Arrest (in [place])  . Schizophrenia ([place])   . 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 Wegener's granulomatosis ([place])     Past Surgical History:  Procedure Laterality Date  . AV FISTULA PLACEMENT Right 02/10/2013   Procedure: ARTERIOVENOUS (AV) FISTULA CREATION;  Surgeon: Rosetta Posner, MD;  Location[facility] OR;  Service: Vascular;  Laterality: Right;  Right forearm radial/cephalic arterovenous fistula.   Marland Kitchen CARDIAC CATHETERIZATION  2010   [place]: In setting of VT arrest. Per brother's report, nonobstructive with no intervention  . CARDIAC DEFIBRILLATOR PLACEMENT  2010   [place]  . FISTULOGRAM Right 08/09/2013   Procedure: FISTULOGRAM;  Surgeon: Angelia Mould, MD;  [facility] C[facility] LAB;  Service: Cardiovascular;  Laterality: Right;  . ICD GENERATOR CHANGEOUT N/A 12/05/2017   Procedure: ICD GENERATOR CHANGEOUT;  Surgeon: Evans Lance, MD;  Location: [place] CV LAB;  Service: Cardiovascular;  Laterality: N/A;  . LIGATION OF COMPETING BRANCHES OF ARTERIOVENOUS FISTULA Right 08/13/2013   Procedure: LIGATION OF COMPETING BRANCHES X5 OF ARTERIOVENOUS FISTULA- RIGHT ARM;  Surgeon: Serafina Mitchell, MD;  Location: [facility];  Service: Vascular;  Laterality:  Right;    Current Outpatient Medications  Medication Sig Dispense Refill  . calcitRIOL (ROCALTROL) 0.5 MCG capsule Take 2 capsules (1 mcg total) by mouth every Monday, Wednesday, and Friday with hemodialysis. 24 capsule 0  . calcium acetate (PHOSLO) 667 MG capsule Take 2 capsules (1,334 mg total) by mouth 3 (three) times daily with meals. 8a, 12p, 5p 180 capsule 0  . gabapentin (NEURONTIN) 300 MG capsule Take 1 capsule (300 mg total) by  mouth 2 (two) times daily. 60 capsule 0  . insulin aspart (NOVOLOG FLEXPEN) 100 UNIT/ML FlexPen Inject 0-5 Units into the skin 3 (three) times daily with meals. 201-250 = 2 units, 251-300 = 3 units, 301-350 = 4 units, 351-400 = 5 units (Patient not taking: Reported on 12/16/2018) 15 mL 0  . Insulin Detemir (LEVEMIR) 100 UNIT/ML Pen Inject 5 Units into the skin at bedtime.    . Insulin Pen Needle 32G X 5 MM MISC 1 each by Does not apply route daily as needed. 100 each 0  . ipratropium-albuterol (DUONEB) 0.5-2.5 (3) MG/3ML SOLN Take 3 mLs by nebulization every 4 (four) hours as needed for up to 30 days. 360 mL 0  . metoprolol succinate (TOPROL-XL) 50 MG 24 hr tablet Take 50 mg by mouth daily.    Marland Kitchen omeprazole (PRILOSEC) 20 MG capsule Take 20 mg by mouth every morning.     Marland Kitchen oxyCODONE-acetaminophen (PERCOCET) 5-325 MG tablet Take 1 tablet by mouth every 8 (eight) hours as needed. 20 tablet 0  . risperiDONE (RISPERDAL) 0.25 MG tablet Take 1 tablet (0.25 mg total) by mouth at bedtime. 30 tablet 0  . traZODone (DESYREL) 100 MG tablet Take 1 tablet (100 mg total) by mouth at bedtime. 30 tablet 0   No current facility-administered medications for this visit.    Allergies:   Patient has no known allergies.   Social History:  The patient  reports that he quit smoking about 11 years ago. He has never used smokeless tobacco. He reports that he does not drink alcohol or use drugs.   Family History:  The patient's family history includes CAD in his mother.  ROS:  Please see the history of present illness.    All other systems are reviewed and otherwise negative.   PHYSICAL EXAM:  VS:  There were no vitals taken for this visit. BMI: There is no height or weight on file to calculate BMI. Well nourished, well developed, in no acute distress  HEENT: normocephalic, atraumatic  Neck: no JVD, carotid bruits or masses Cardiac:  RRR; no significant murmurs, no rubs, or gallops Lungs:  CTA b/l, no wheezing,  rhonchi or rales  Abd: soft, nontender MS: no deformity or atrophy Ext:  no LE edema, AVF RUE/forearm Skin: warm and dry, no rash Neuro:  No gross deficits appreciated Psych: euthymic mood, full affect  ICD site is stable, no tethering or discomfort   EKG:  Done today and reviewed by myself SR, 88bpm, RBBB, appears unchanged from prior  ICD interrogation done today and reviewed by myself:  Battery is good Lead testing today (programmed sensing bipolar, pacing tip to coil) Impedances:pacing 475(tip-coil), and 2128 (bipolar), RV 61, SCV 82 Threshold 3.75/0.8 and 3.5/1.0 R wave 3.6 (programmed sensitivity 0.7mV) TWOS all occurred between April 4-04/2019, he had no shocks, given wavelet descriminator and TWOS algorithm No other evidence of sensing issue  Lead trends reviewed at length with MDT rep and d/w Dr. Caryl Comes Bipolar impedance has been high and trending upwards, acutely to >  3000 this month Tip-coil impedance has been and remains stable  HV impedances have been and remain stable RV threshold has been chronically high as well He is programmed today to 5.00V/1.20ms, he is 100% VS    12/23/13: TTE Study Conclusions - Left ventricle: The cavity size was normal. Wall thickness was normal. Systolic function was mildly reduced. The estimated ejection fraction was in the range of 45% to 50%. Diffuse hypokinesis. Doppler parameters are consistent with abnormal left ventricular relaxation (grade 1 diastolic dysfunction). - Mitral valve: Mild to moderate regurgitation.  Recent Labs: 01/05/2019: ALT 18; BUN 56; Creatinine, Ser 8.28; Hemoglobin 10.5; Platelets 138; Potassium 4.5; Sodium 134  No results found for requested labs within last 8760 hours.   CrCl cannot be calculated (Patient's most recent lab result is older than the maximum 21 days allowed.).   Wt Readings from Last 3 Encounters:  01/05/19 174 lb 2.6 oz (79 kg)  12/19/18 166 lb 3.6 oz (75.4 kg)  12/15/18 160  lb (72.6 kg)     Other studies reviewed: Additional studies/records reviewed today include: summarized above  ASSESSMENT AND PLAN:  1. ICD     Device check reviewed with Dr. Caryl Comes  Given his lead impedance tip to coil and shock impedances are stable, no need at this time to consider lead revision  Noting as well, given he is a ESRF/HD patient his infection risk is particularly high  Hopefully lead will carry him to his gen change (estimated 9 years)  We were unable to turn off bipolar impedance alert alone, lead alerts remains on, unfortunately will likely alert again.  Though did not feel comfortable turning it off.  His TWOS back in April given has not occurred again, felt likely to have been electrolyte driven with his ESRF.   2. CM, chronic CHF (systolic)     No symptoms or exam findings to suggest fluid OL     Fluid management with dialysis  3. HTN     Looks OK, no changes, on metoprolol  4. ? Hx of AFib     CHA2DS2Vasc is 2     Unknown when this was noted, not on a/c     None documented as far as I can tell by his chart    Disposition:he is now living with his brother, hopefully will have easier time with his follow up.  He brought his home transmitter and was made sure he was paired and working.  Will have him come back in 3 mo, to revisit his lead in-clinic, if stable can likely get back to remotes if he has better/more dependable contact/follow up.   Current medicines are reviewed at length with the patient today.  The patient did not have any concerns regarding medicines.    Venetia Night, PA-C 11/22/2019 8:19 PM     [place] [address] [phone] (office)  [phone] (fax)

## 2019-11-23 DIAGNOSIS — D509 Iron deficiency anemia, unspecified: Secondary | ICD-10-CM | POA: Diagnosis not present

## 2019-11-23 DIAGNOSIS — N2581 Secondary hyperparathyroidism of renal origin: Secondary | ICD-10-CM | POA: Diagnosis not present

## 2019-11-23 DIAGNOSIS — D631 Anemia in chronic kidney disease: Secondary | ICD-10-CM | POA: Diagnosis not present

## 2019-11-23 DIAGNOSIS — N186 End stage renal disease: Secondary | ICD-10-CM | POA: Diagnosis not present

## 2019-11-23 DIAGNOSIS — Z992 Dependence on renal dialysis: Secondary | ICD-10-CM | POA: Diagnosis not present

## 2019-11-24 ENCOUNTER — Other Ambulatory Visit: Payer: Self-pay

## 2019-11-24 ENCOUNTER — Ambulatory Visit (INDEPENDENT_AMBULATORY_CARE_PROVIDER_SITE_OTHER): Payer: Medicare Other | Admitting: *Deleted

## 2019-11-24 ENCOUNTER — Ambulatory Visit (INDEPENDENT_AMBULATORY_CARE_PROVIDER_SITE_OTHER): Payer: Medicare Other | Admitting: Physician Assistant

## 2019-11-24 VITALS — BP 120/76 | HR 88 | Ht 68.0 in | Wt 172.0 lb

## 2019-11-24 DIAGNOSIS — I10 Essential (primary) hypertension: Secondary | ICD-10-CM | POA: Diagnosis not present

## 2019-11-24 DIAGNOSIS — I4901 Ventricular fibrillation: Secondary | ICD-10-CM | POA: Diagnosis not present

## 2019-11-24 DIAGNOSIS — I5022 Chronic systolic (congestive) heart failure: Secondary | ICD-10-CM | POA: Diagnosis not present

## 2019-11-24 DIAGNOSIS — Z4502 Encounter for adjustment and management of automatic implantable cardiac defibrillator: Secondary | ICD-10-CM | POA: Diagnosis not present

## 2019-11-24 LAB — CUP PACEART REMOTE DEVICE CHECK
Battery Remaining Longevity: 117 mo
Battery Voltage: 2.94 V
Brady Statistic RV Percent Paced: 0.01 %
Date Time Interrogation Session: 20210127192613
HighPow Impedance: 61 Ohm
HighPow Impedance: 82 Ohm
Implantable Lead Implant Date: 20100415
Implantable Lead Location: 753860
Implantable Lead Model: 6947
Implantable Pulse Generator Implant Date: 20190208
Lead Channel Impedance Value: 2128 Ohm
Lead Channel Impedance Value: 475 Ohm
Lead Channel Pacing Threshold Amplitude: 2.5 V
Lead Channel Pacing Threshold Pulse Width: 0.4 ms
Lead Channel Sensing Intrinsic Amplitude: 3.625 mV
Lead Channel Sensing Intrinsic Amplitude: 3.875 mV
Lead Channel Setting Pacing Amplitude: 3.5 V
Lead Channel Setting Pacing Pulse Width: 0.8 ms
Lead Channel Setting Sensing Sensitivity: 0.3 mV

## 2019-11-24 NOTE — Progress Notes (Signed)
ICD remote 

## 2019-11-24 NOTE — Patient Instructions (Addendum)
Medication Instructions:   Your physician recommends that you continue on your current medications as directed. Please refer to the Current Medication list given to you today.   *If you need a refill on your cardiac medications before your next appointment, please call your pharmacy*  Lab Work: [place]   If you have labs (blood work) drawn today and your tests are completely normal, you will receive your results only by: Marland Kitchen MyChart Message (if you have MyChart) OR . A paper copy in the mail If you have any lab test that is abnormal or we need to change your treatment, we will call you to review the results.  Testing/Procedures: NONE ORDERED  TODAY   Follow-Up: At [facility], you and your health needs are our priority.  As part of our continuing mission to provide you with exceptional heart care, we have created designated Provider Care Teams.  These Care Teams include your primary Cardiologist (physician) and Advanced Practice Providers (APPs -  Physician Assistants and Nurse Practitioners) who all work together to provide you with the care you need, when you need it.  Your next appointment:   3 month(s)  The format for your next appointment:   In Person  Provider:  Dr Lovena Le Only     Other Instructions

## 2019-11-25 DIAGNOSIS — D509 Iron deficiency anemia, unspecified: Secondary | ICD-10-CM | POA: Diagnosis not present

## 2019-11-25 DIAGNOSIS — N186 End stage renal disease: Secondary | ICD-10-CM | POA: Diagnosis not present

## 2019-11-25 DIAGNOSIS — N2581 Secondary hyperparathyroidism of renal origin: Secondary | ICD-10-CM | POA: Diagnosis not present

## 2019-11-25 DIAGNOSIS — D631 Anemia in chronic kidney disease: Secondary | ICD-10-CM | POA: Diagnosis not present

## 2019-11-25 DIAGNOSIS — Z992 Dependence on renal dialysis: Secondary | ICD-10-CM | POA: Diagnosis not present

## 2019-11-27 DIAGNOSIS — N2581 Secondary hyperparathyroidism of renal origin: Secondary | ICD-10-CM | POA: Diagnosis not present

## 2019-11-27 DIAGNOSIS — N186 End stage renal disease: Secondary | ICD-10-CM | POA: Diagnosis not present

## 2019-11-27 DIAGNOSIS — D509 Iron deficiency anemia, unspecified: Secondary | ICD-10-CM | POA: Diagnosis not present

## 2019-11-27 DIAGNOSIS — Z992 Dependence on renal dialysis: Secondary | ICD-10-CM | POA: Diagnosis not present

## 2019-11-27 DIAGNOSIS — D631 Anemia in chronic kidney disease: Secondary | ICD-10-CM | POA: Diagnosis not present

## 2019-11-29 DIAGNOSIS — Z992 Dependence on renal dialysis: Secondary | ICD-10-CM | POA: Diagnosis not present

## 2019-11-29 DIAGNOSIS — N186 End stage renal disease: Secondary | ICD-10-CM | POA: Diagnosis not present

## 2019-11-29 DIAGNOSIS — N032 Chronic nephritic syndrome with diffuse membranous glomerulonephritis: Secondary | ICD-10-CM | POA: Diagnosis not present

## 2019-11-30 DIAGNOSIS — N186 End stage renal disease: Secondary | ICD-10-CM | POA: Diagnosis not present

## 2019-11-30 DIAGNOSIS — D509 Iron deficiency anemia, unspecified: Secondary | ICD-10-CM | POA: Diagnosis not present

## 2019-11-30 DIAGNOSIS — N2581 Secondary hyperparathyroidism of renal origin: Secondary | ICD-10-CM | POA: Diagnosis not present

## 2019-11-30 DIAGNOSIS — D631 Anemia in chronic kidney disease: Secondary | ICD-10-CM | POA: Diagnosis not present

## 2019-11-30 DIAGNOSIS — Z992 Dependence on renal dialysis: Secondary | ICD-10-CM | POA: Diagnosis not present

## 2019-12-02 DIAGNOSIS — N186 End stage renal disease: Secondary | ICD-10-CM | POA: Diagnosis not present

## 2019-12-02 DIAGNOSIS — Z992 Dependence on renal dialysis: Secondary | ICD-10-CM | POA: Diagnosis not present

## 2019-12-02 DIAGNOSIS — N2581 Secondary hyperparathyroidism of renal origin: Secondary | ICD-10-CM | POA: Diagnosis not present

## 2019-12-02 DIAGNOSIS — D631 Anemia in chronic kidney disease: Secondary | ICD-10-CM | POA: Diagnosis not present

## 2019-12-02 DIAGNOSIS — D509 Iron deficiency anemia, unspecified: Secondary | ICD-10-CM | POA: Diagnosis not present

## 2019-12-04 DIAGNOSIS — Z992 Dependence on renal dialysis: Secondary | ICD-10-CM | POA: Diagnosis not present

## 2019-12-04 DIAGNOSIS — D509 Iron deficiency anemia, unspecified: Secondary | ICD-10-CM | POA: Diagnosis not present

## 2019-12-04 DIAGNOSIS — D631 Anemia in chronic kidney disease: Secondary | ICD-10-CM | POA: Diagnosis not present

## 2019-12-04 DIAGNOSIS — N186 End stage renal disease: Secondary | ICD-10-CM | POA: Diagnosis not present

## 2019-12-04 DIAGNOSIS — N2581 Secondary hyperparathyroidism of renal origin: Secondary | ICD-10-CM | POA: Diagnosis not present

## 2019-12-07 DIAGNOSIS — Z992 Dependence on renal dialysis: Secondary | ICD-10-CM | POA: Diagnosis not present

## 2019-12-07 DIAGNOSIS — N2581 Secondary hyperparathyroidism of renal origin: Secondary | ICD-10-CM | POA: Diagnosis not present

## 2019-12-07 DIAGNOSIS — D509 Iron deficiency anemia, unspecified: Secondary | ICD-10-CM | POA: Diagnosis not present

## 2019-12-07 DIAGNOSIS — N186 End stage renal disease: Secondary | ICD-10-CM | POA: Diagnosis not present

## 2019-12-07 DIAGNOSIS — D631 Anemia in chronic kidney disease: Secondary | ICD-10-CM | POA: Diagnosis not present

## 2019-12-09 DIAGNOSIS — D631 Anemia in chronic kidney disease: Secondary | ICD-10-CM | POA: Diagnosis not present

## 2019-12-09 DIAGNOSIS — Z992 Dependence on renal dialysis: Secondary | ICD-10-CM | POA: Diagnosis not present

## 2019-12-09 DIAGNOSIS — D509 Iron deficiency anemia, unspecified: Secondary | ICD-10-CM | POA: Diagnosis not present

## 2019-12-09 DIAGNOSIS — N186 End stage renal disease: Secondary | ICD-10-CM | POA: Diagnosis not present

## 2019-12-09 DIAGNOSIS — N2581 Secondary hyperparathyroidism of renal origin: Secondary | ICD-10-CM | POA: Diagnosis not present

## 2019-12-11 DIAGNOSIS — N186 End stage renal disease: Secondary | ICD-10-CM | POA: Diagnosis not present

## 2019-12-11 DIAGNOSIS — D509 Iron deficiency anemia, unspecified: Secondary | ICD-10-CM | POA: Diagnosis not present

## 2019-12-11 DIAGNOSIS — D631 Anemia in chronic kidney disease: Secondary | ICD-10-CM | POA: Diagnosis not present

## 2019-12-11 DIAGNOSIS — Z992 Dependence on renal dialysis: Secondary | ICD-10-CM | POA: Diagnosis not present

## 2019-12-11 DIAGNOSIS — N2581 Secondary hyperparathyroidism of renal origin: Secondary | ICD-10-CM | POA: Diagnosis not present

## 2019-12-13 IMAGING — CR DG CHEST 2V
2 series · 2 of 2 positions shown · non-contrast
Comparison: 11/03/2014

CLINICAL DATA: Chest pain and shortness of breath.  Cough.

EXAM:
CHEST - 2 VIEW

[chest pa]
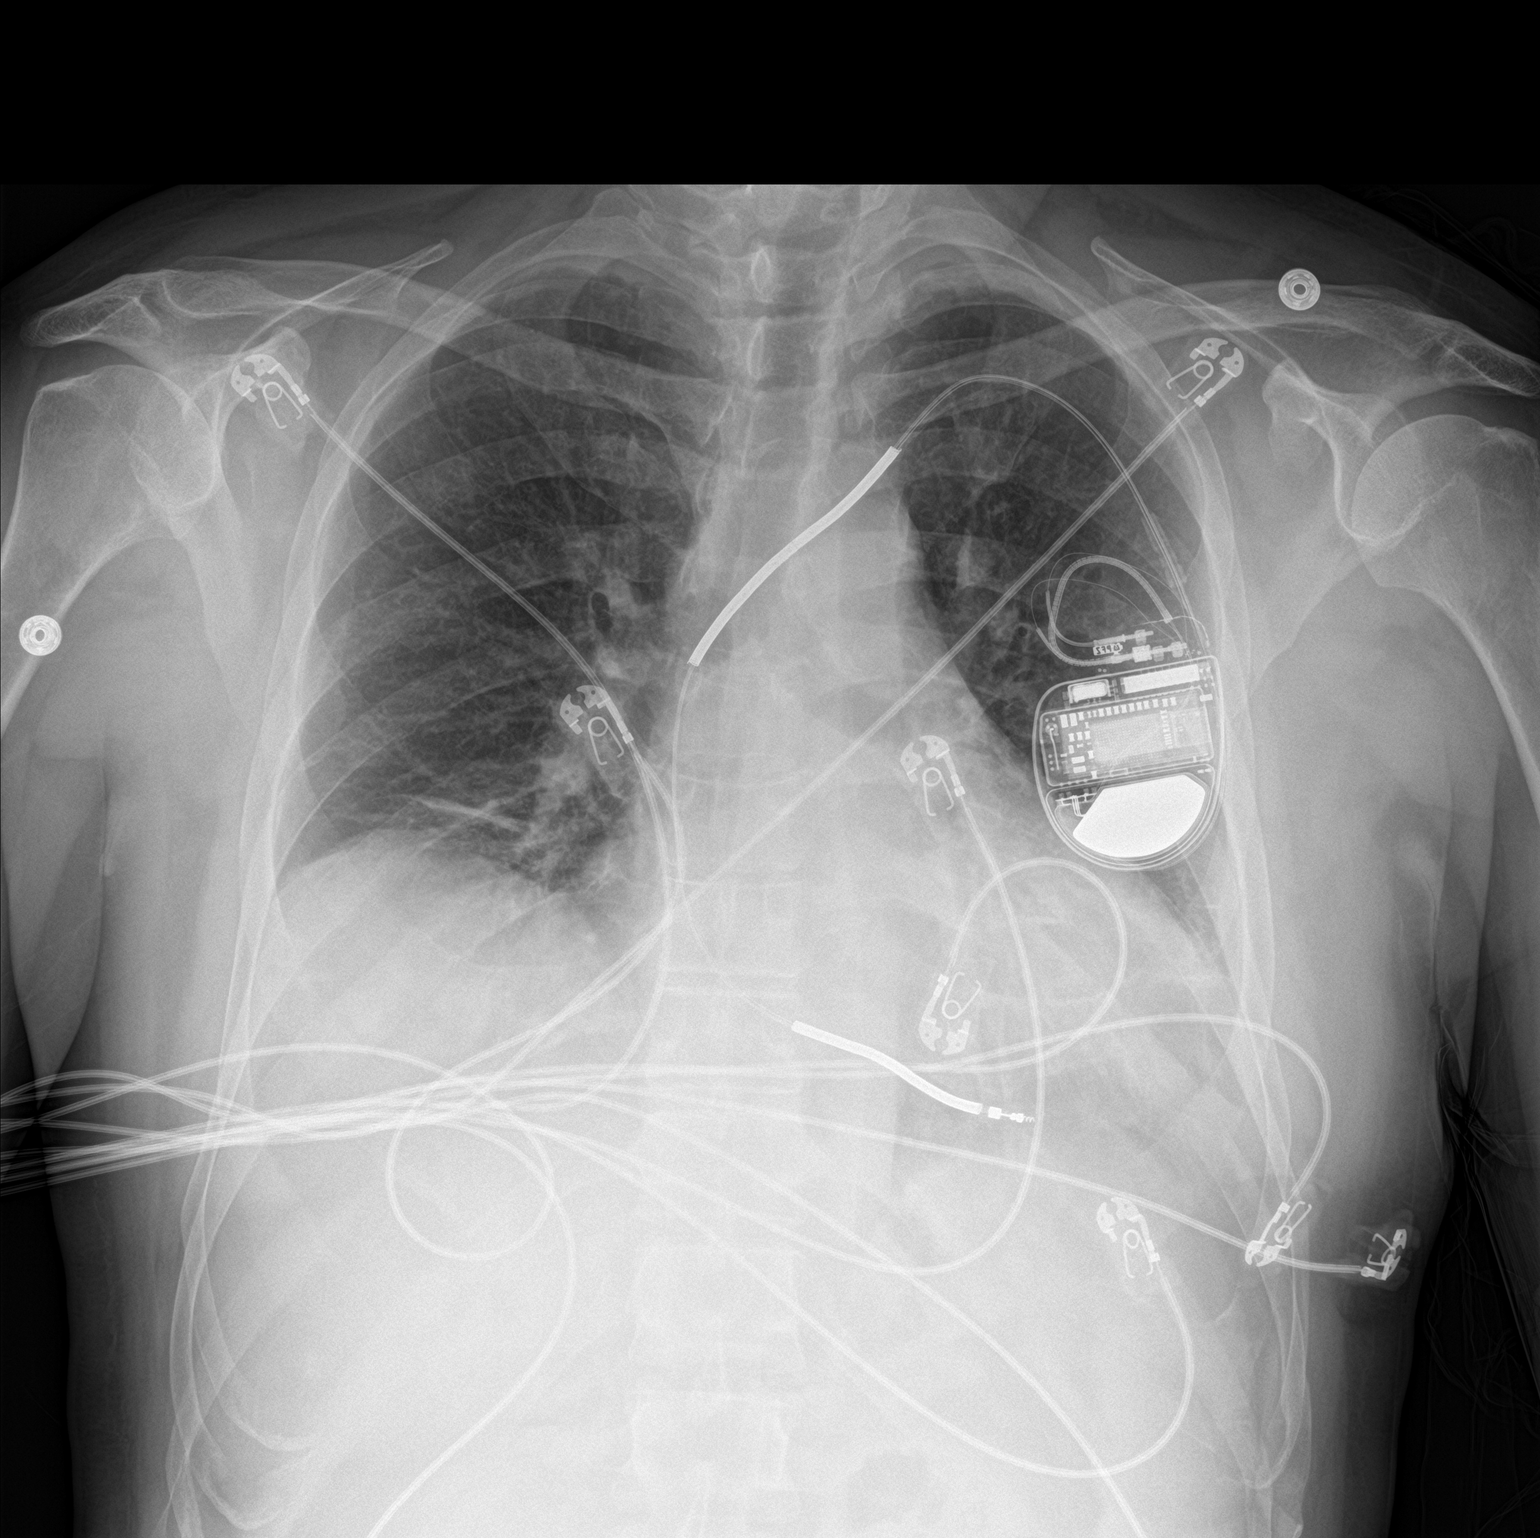

[chest lat]
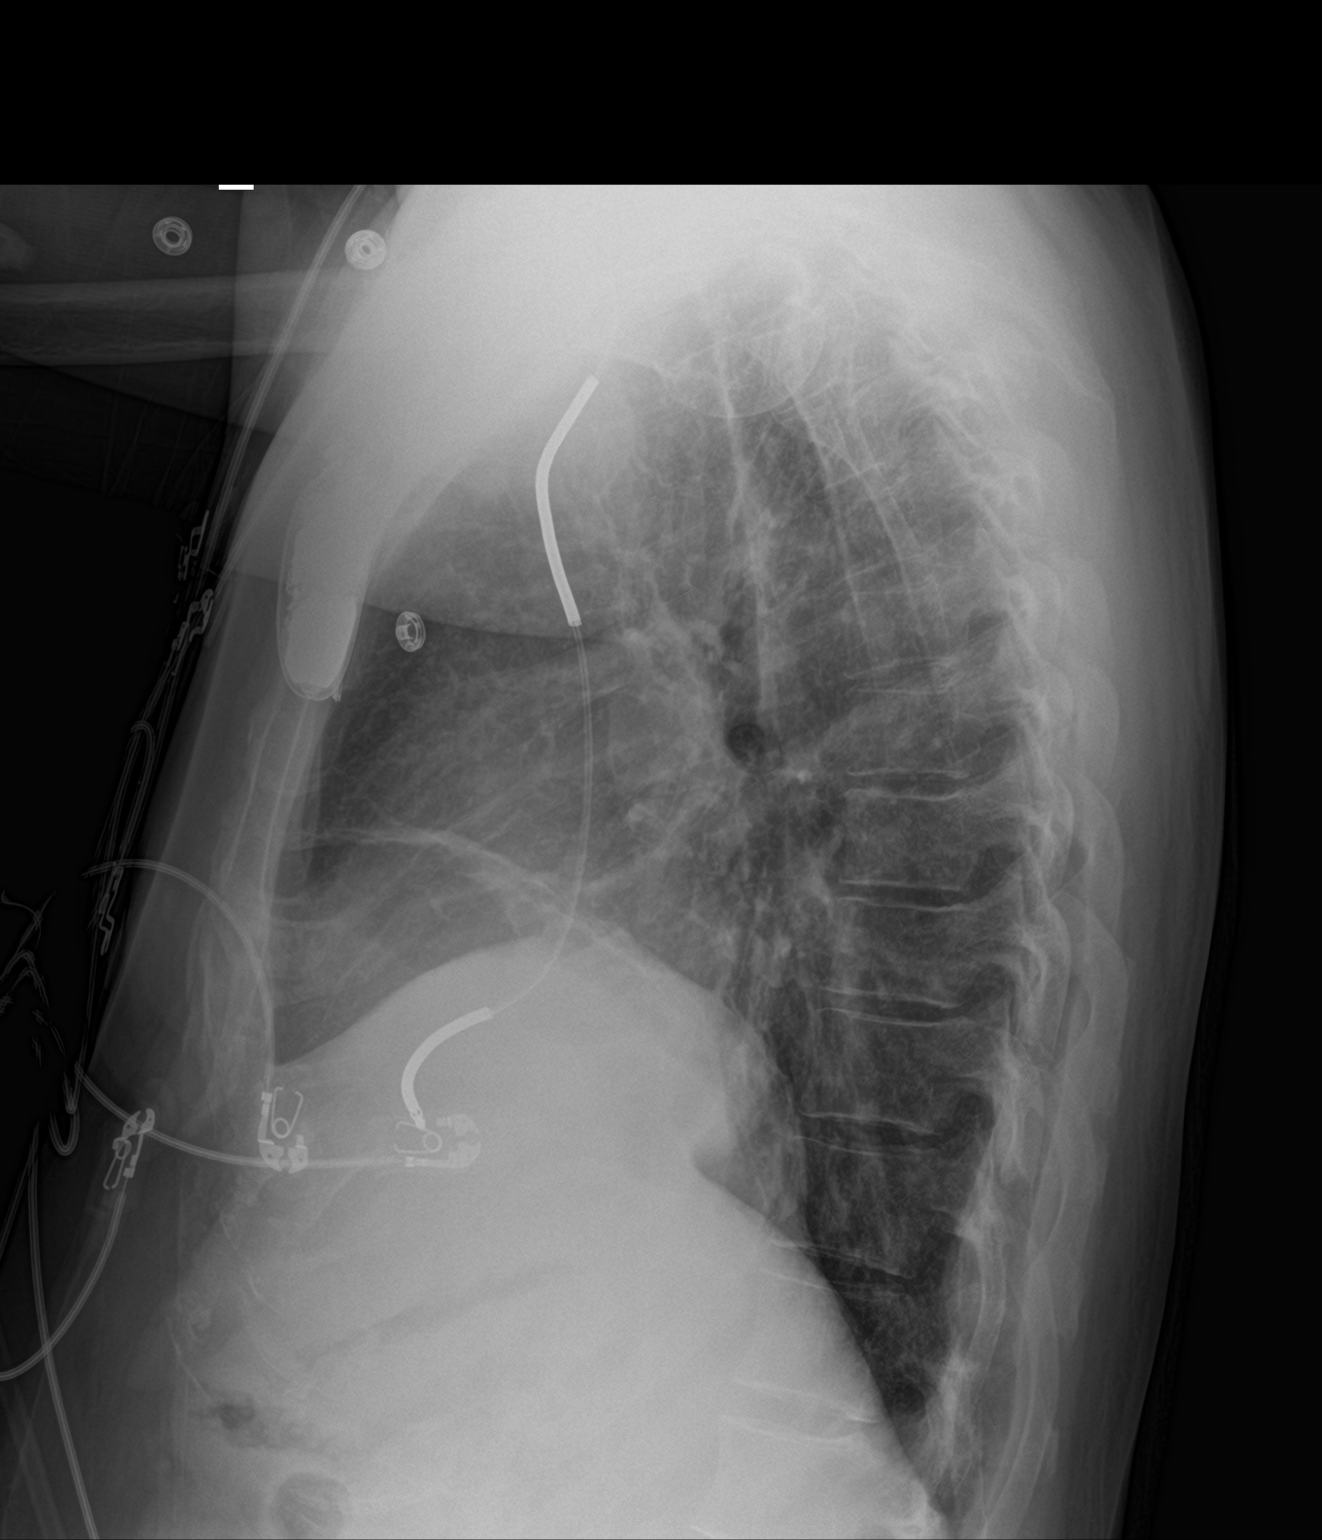

[2 of 2 positions shown; findings below may reference images not displayed]

FINDINGS: Stable position of the cardiac pacemaker.

Enlarged cardiac silhouette.  Mediastinal contours appear intact.

There is no evidence of focal pleural effusion or pneumothorax.
Elevation of the right hemidiaphragm likely due to volume loss in
the right hemithorax. Subtle peribronchial bilateral lower lobe
opacities.

Osseous structures are without acute abnormality. Soft tissues are
grossly normal.
IMPRESSION: Bilateral peribronchial lower lobe airspace opacities which may
represent atelectasis or airspace consolidation.

## 2019-12-14 DIAGNOSIS — D631 Anemia in chronic kidney disease: Secondary | ICD-10-CM | POA: Diagnosis not present

## 2019-12-14 DIAGNOSIS — N186 End stage renal disease: Secondary | ICD-10-CM | POA: Diagnosis not present

## 2019-12-14 DIAGNOSIS — N2581 Secondary hyperparathyroidism of renal origin: Secondary | ICD-10-CM | POA: Diagnosis not present

## 2019-12-14 DIAGNOSIS — Z992 Dependence on renal dialysis: Secondary | ICD-10-CM | POA: Diagnosis not present

## 2019-12-14 DIAGNOSIS — D509 Iron deficiency anemia, unspecified: Secondary | ICD-10-CM | POA: Diagnosis not present

## 2019-12-16 DIAGNOSIS — Z992 Dependence on renal dialysis: Secondary | ICD-10-CM | POA: Diagnosis not present

## 2019-12-16 DIAGNOSIS — N2581 Secondary hyperparathyroidism of renal origin: Secondary | ICD-10-CM | POA: Diagnosis not present

## 2019-12-16 DIAGNOSIS — D509 Iron deficiency anemia, unspecified: Secondary | ICD-10-CM | POA: Diagnosis not present

## 2019-12-16 DIAGNOSIS — N186 End stage renal disease: Secondary | ICD-10-CM | POA: Diagnosis not present

## 2019-12-16 DIAGNOSIS — D631 Anemia in chronic kidney disease: Secondary | ICD-10-CM | POA: Diagnosis not present

## 2019-12-18 DIAGNOSIS — Z992 Dependence on renal dialysis: Secondary | ICD-10-CM | POA: Diagnosis not present

## 2019-12-18 DIAGNOSIS — D631 Anemia in chronic kidney disease: Secondary | ICD-10-CM | POA: Diagnosis not present

## 2019-12-18 DIAGNOSIS — D509 Iron deficiency anemia, unspecified: Secondary | ICD-10-CM | POA: Diagnosis not present

## 2019-12-18 DIAGNOSIS — N2581 Secondary hyperparathyroidism of renal origin: Secondary | ICD-10-CM | POA: Diagnosis not present

## 2019-12-18 DIAGNOSIS — N186 End stage renal disease: Secondary | ICD-10-CM | POA: Diagnosis not present

## 2019-12-21 DIAGNOSIS — D509 Iron deficiency anemia, unspecified: Secondary | ICD-10-CM | POA: Diagnosis not present

## 2019-12-21 DIAGNOSIS — D631 Anemia in chronic kidney disease: Secondary | ICD-10-CM | POA: Diagnosis not present

## 2019-12-21 DIAGNOSIS — N2581 Secondary hyperparathyroidism of renal origin: Secondary | ICD-10-CM | POA: Diagnosis not present

## 2019-12-21 DIAGNOSIS — Z992 Dependence on renal dialysis: Secondary | ICD-10-CM | POA: Diagnosis not present

## 2019-12-21 DIAGNOSIS — N186 End stage renal disease: Secondary | ICD-10-CM | POA: Diagnosis not present

## 2019-12-23 DIAGNOSIS — Z992 Dependence on renal dialysis: Secondary | ICD-10-CM | POA: Diagnosis not present

## 2019-12-23 DIAGNOSIS — N186 End stage renal disease: Secondary | ICD-10-CM | POA: Diagnosis not present

## 2019-12-23 DIAGNOSIS — N2581 Secondary hyperparathyroidism of renal origin: Secondary | ICD-10-CM | POA: Diagnosis not present

## 2019-12-23 DIAGNOSIS — D509 Iron deficiency anemia, unspecified: Secondary | ICD-10-CM | POA: Diagnosis not present

## 2019-12-23 DIAGNOSIS — D631 Anemia in chronic kidney disease: Secondary | ICD-10-CM | POA: Diagnosis not present

## 2019-12-23 IMAGING — CT CT BIOPSY
1 of 3 series · 8 of 32 positions shown, 13 images · non-contrast
Comparison: none

INDICATION: 51-year-old male with a history of granulomatosis with polyangiitis
and a left apical pulmonary nodule. Differential considerations
include granulomatous nodule, and possibly primary bronchogenic
carcinoma. He presents for CT-guided biopsy of the same.

[Series 2: i-spiral 5.0 b40f · axial · 0.59mm/px · z∈[+1273,+1367]mm · 8 of 35 slices shown, 13 images]
[im 4/35  soft-tissue]
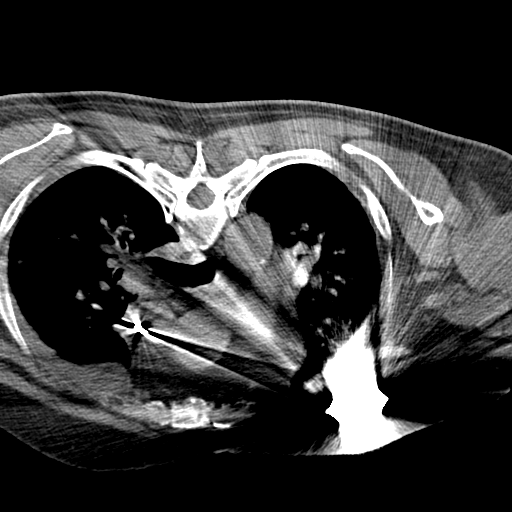
[im 4/35  bone]
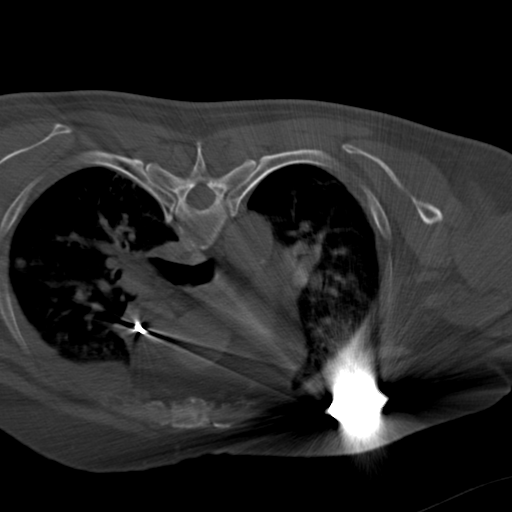
[im 8/35  soft-tissue]
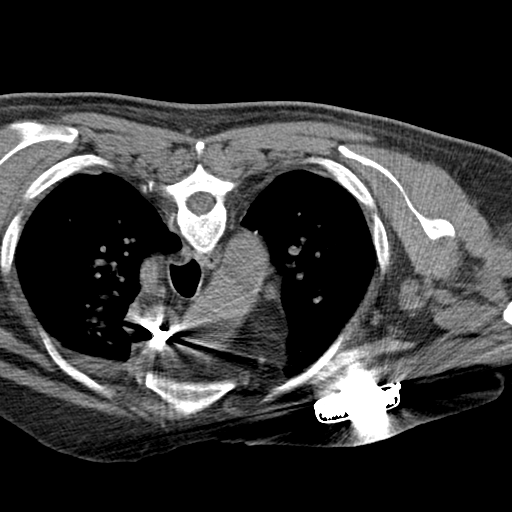
[im 12/35  soft-tissue]
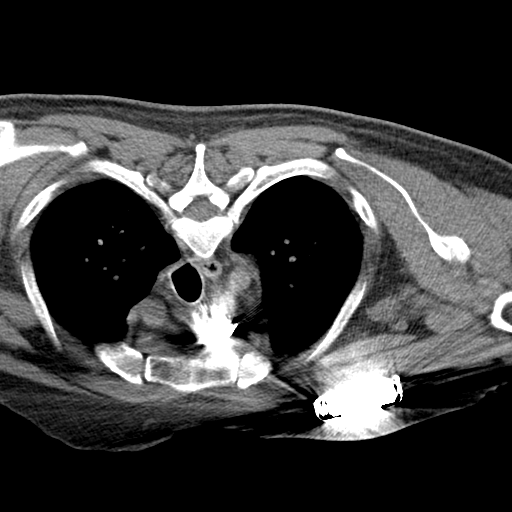
[im 16/35  soft-tissue]
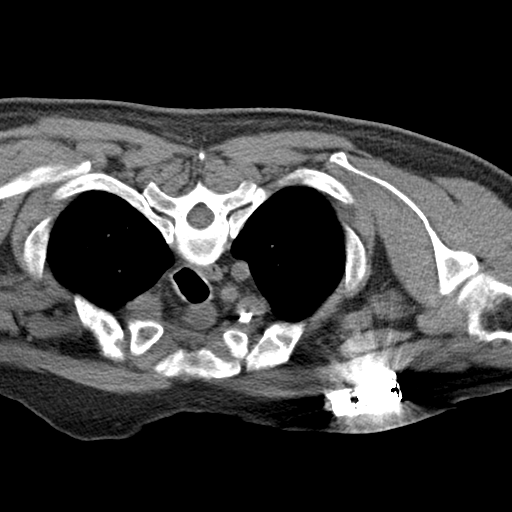
[im 19/35  soft-tissue]
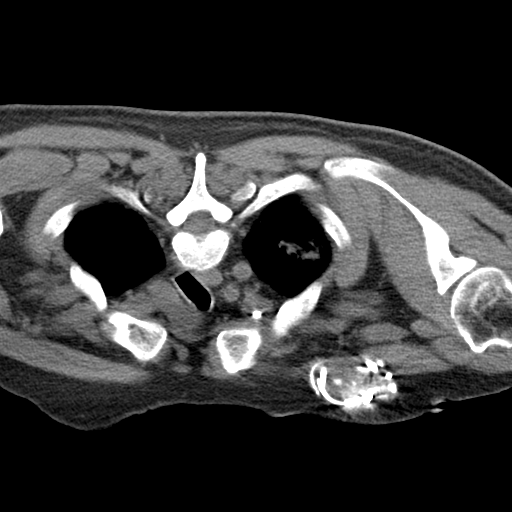
[im 19/35  lung]
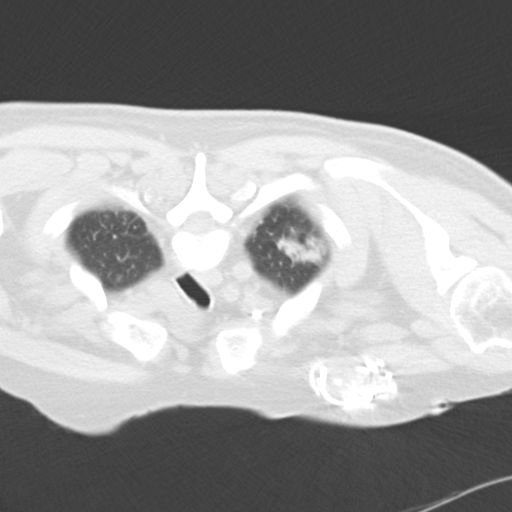
[im 23/35  soft-tissue]
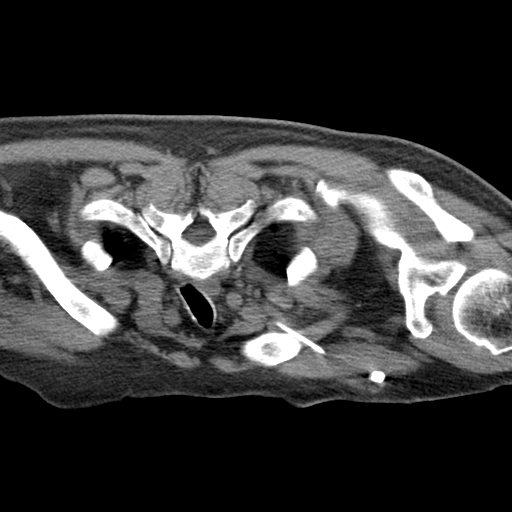
[im 23/35  lung]
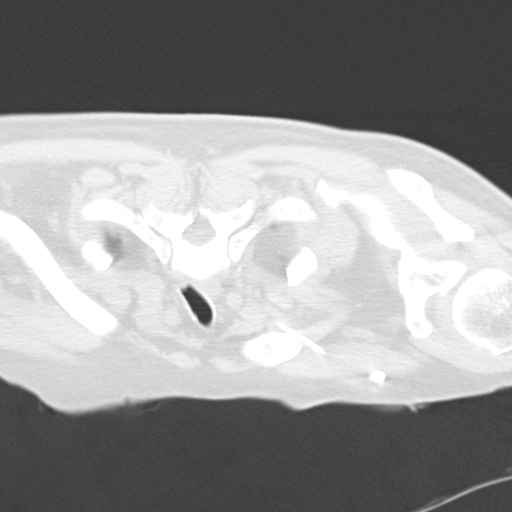
[im 27/35  soft-tissue]
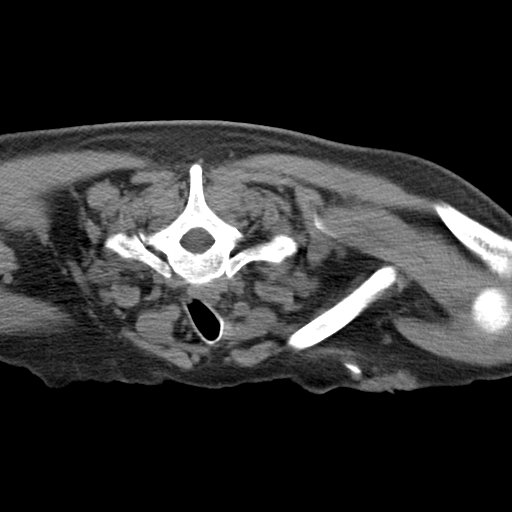
[im 27/35  lung]
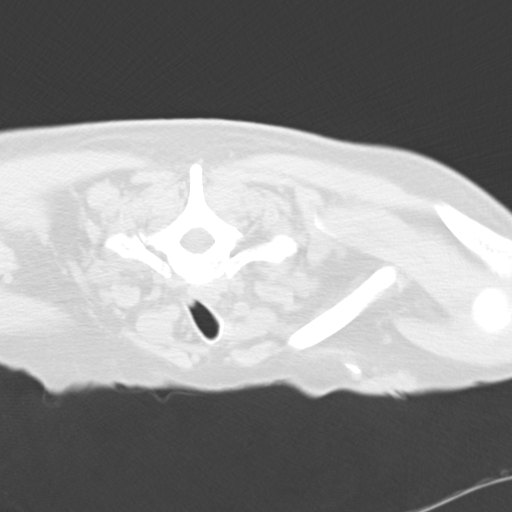
[im 31/35  soft-tissue]
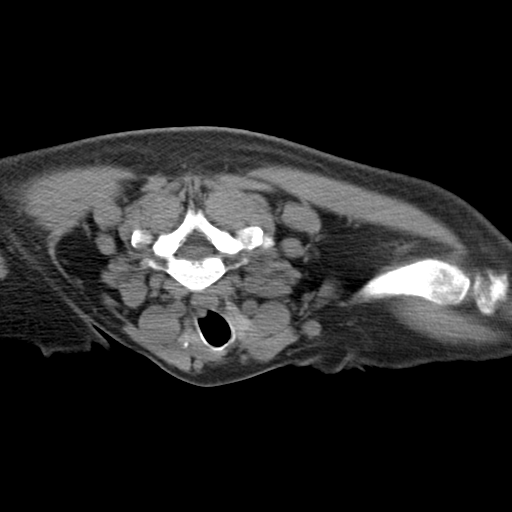
[im 31/35  lung]
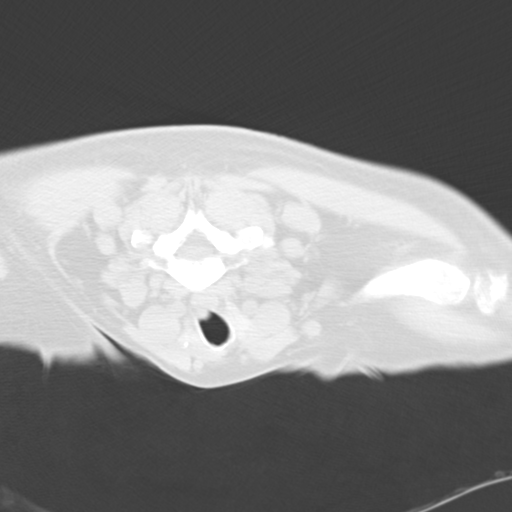

[8 of 32 positions shown; findings below may reference images not displayed]

EXAM:
CT-guided biopsy left upper lobe pulmonary nodule

MEDICATIONS:
None.

ANESTHESIA/SEDATION:
Fentanyl 1 mcg IV; Versed 50 mg IV

Moderate Sedation Time:  13 minutes

The patient was continuously monitored during the procedure by the
interventional radiology nurse under my direct supervision.

FLUOROSCOPY TIME:  None.

COMPLICATIONS:
None immediate.

Estimated blood loss:  0

PROCEDURE:
Informed written consent was obtained from the patient after a
thorough discussion of the procedural risks, benefits and
alternatives. All questions were addressed. Maximal Sterile Barrier
Technique was utilized including caps, mask, sterile gowns, sterile
gloves, sterile drape, hand hygiene and skin antiseptic. A timeout
was performed prior to the initiation of the procedure.

A planning axial CT scan was performed. The nodule in the left lung
apex was successfully identified. A suitable skin entry site was
selected and marked. The region was then sterilely prepped and
draped in standard fashion using Betadine skin prep. Local
anesthesia was attained by infiltration with 1% lidocaine. A small
dermatotomy was made. Under intermittent CT fluoroscopic guidance, a
17 gauge trocar needle was advanced into the lung and positioned at
the margin of the nodule.

Multiple 18 gauge core biopsies were then coaxially obtained using
the BioPince automated biopsy device. Biopsy specimens were placed
in formalin and delivered to pathology for further analysis. The
biopsy device and introducer needle were removed and a bio sentry
device was deployed. Post biopsy axial CT imaging demonstrates no
evidence of immediate complication. There is no pneumothorax. Mild
perilesional alveolar hemorrhage is not unexpected. The patient
tolerated the procedure well.
IMPRESSION: Technically successful CT-guided biopsy left apical pulmonary
nodule.

## 2019-12-25 DIAGNOSIS — Z992 Dependence on renal dialysis: Secondary | ICD-10-CM | POA: Diagnosis not present

## 2019-12-25 DIAGNOSIS — D509 Iron deficiency anemia, unspecified: Secondary | ICD-10-CM | POA: Diagnosis not present

## 2019-12-25 DIAGNOSIS — D631 Anemia in chronic kidney disease: Secondary | ICD-10-CM | POA: Diagnosis not present

## 2019-12-25 DIAGNOSIS — N2581 Secondary hyperparathyroidism of renal origin: Secondary | ICD-10-CM | POA: Diagnosis not present

## 2019-12-25 DIAGNOSIS — N186 End stage renal disease: Secondary | ICD-10-CM | POA: Diagnosis not present

## 2019-12-27 DIAGNOSIS — N186 End stage renal disease: Secondary | ICD-10-CM | POA: Diagnosis not present

## 2019-12-27 DIAGNOSIS — N032 Chronic nephritic syndrome with diffuse membranous glomerulonephritis: Secondary | ICD-10-CM | POA: Diagnosis not present

## 2019-12-27 DIAGNOSIS — Z992 Dependence on renal dialysis: Secondary | ICD-10-CM | POA: Diagnosis not present

## 2019-12-28 ENCOUNTER — Telehealth: Payer: Self-pay

## 2019-12-28 DIAGNOSIS — N2581 Secondary hyperparathyroidism of renal origin: Secondary | ICD-10-CM | POA: Diagnosis not present

## 2019-12-28 DIAGNOSIS — Z992 Dependence on renal dialysis: Secondary | ICD-10-CM | POA: Diagnosis not present

## 2019-12-28 DIAGNOSIS — N186 End stage renal disease: Secondary | ICD-10-CM | POA: Diagnosis not present

## 2019-12-28 DIAGNOSIS — D631 Anemia in chronic kidney disease: Secondary | ICD-10-CM | POA: Diagnosis not present

## 2019-12-28 DIAGNOSIS — D509 Iron deficiency anemia, unspecified: Secondary | ICD-10-CM | POA: Diagnosis not present

## 2019-12-28 NOTE — Telephone Encounter (Signed)
The pt brother states the pt ICD is beeping every 4 hours. Amy and I schedule him to come in Friday at 11am for a device clinic appointment.

## 2019-12-30 DIAGNOSIS — N2581 Secondary hyperparathyroidism of renal origin: Secondary | ICD-10-CM | POA: Diagnosis not present

## 2019-12-30 DIAGNOSIS — N186 End stage renal disease: Secondary | ICD-10-CM | POA: Diagnosis not present

## 2019-12-30 DIAGNOSIS — D631 Anemia in chronic kidney disease: Secondary | ICD-10-CM | POA: Diagnosis not present

## 2019-12-30 DIAGNOSIS — Z992 Dependence on renal dialysis: Secondary | ICD-10-CM | POA: Diagnosis not present

## 2019-12-30 DIAGNOSIS — D509 Iron deficiency anemia, unspecified: Secondary | ICD-10-CM | POA: Diagnosis not present

## 2019-12-31 ENCOUNTER — Ambulatory Visit (INDEPENDENT_AMBULATORY_CARE_PROVIDER_SITE_OTHER): Payer: Medicare Other | Admitting: Internal Medicine

## 2019-12-31 ENCOUNTER — Other Ambulatory Visit: Payer: Self-pay

## 2019-12-31 ENCOUNTER — Encounter: Payer: Self-pay | Admitting: Internal Medicine

## 2019-12-31 VITALS — BP 156/84 | HR 80 | Ht 68.0 in | Wt 171.0 lb

## 2019-12-31 DIAGNOSIS — T82110A Breakdown (mechanical) of cardiac electrode, initial encounter: Secondary | ICD-10-CM | POA: Diagnosis not present

## 2019-12-31 DIAGNOSIS — I5022 Chronic systolic (congestive) heart failure: Secondary | ICD-10-CM

## 2019-12-31 DIAGNOSIS — I4901 Ventricular fibrillation: Secondary | ICD-10-CM

## 2019-12-31 MED ORDER — METOPROLOL SUCCINATE ER 50 MG PO TB24: 50.0000 mg | ORAL_TABLET | Freq: Every day | ORAL | 3 refills | Status: DC

## 2019-12-31 MED ORDER — ATORVASTATIN CALCIUM 20 MG PO TABS: 20.0000 mg | ORAL_TABLET | Freq: Every day | ORAL | 3 refills | Status: DC

## 2019-12-31 NOTE — Patient Instructions (Addendum)
Medication Instructions:  Your physician recommends that you continue on your current medications as directed. Please refer to the Current Medication list given to you today.  Labwork: You will get lab work today:  BMP and CBC  Testing/Procedures: None ordered.  Follow-Up:  Your procedure will be on March 15 or January 13, 2020.  I will call to confirm the date with you.  Any Other Special Instructions Will Be Listed Below (If Applicable).  If you need a refill on your cardiac medications before your next appointment, please call your pharmacy.

## 2019-12-31 NOTE — Progress Notes (Signed)
HPI [name] returns today after a long absence from our EP clinic. He is a pleasant 54 yo man with a h/o VF arrest, s/p ICD insertion. He has developed progressive dysfunction of his RV lead with gradually declining R waves and increasing ventricular thresholds. He has not had any ICD therapies.  No Known Allergies   Current Outpatient Medications  Medication Sig Dispense Refill  . atorvastatin (LIPITOR) 20 MG tablet Take 1 tablet (20 mg total) by mouth daily. 90 tablet 3  . AURYXIA 1 GM 210 MG(Fe) tablet Take 420 mg by mouth 3 (three) times daily.    . calcitRIOL (ROCALTROL) 0.5 MCG capsule Take 2 capsules (1 mcg total) by mouth every Monday, Wednesday, and Friday with hemodialysis. 24 capsule 0  . calcium acetate (PHOSLO) 667 MG capsule Take 2 capsules (1,334 mg total) by mouth 3 (three) times daily with meals. 8a, 12p, 5p 180 capsule 0  . metoprolol succinate (TOPROL-XL) 50 MG 24 hr tablet Take 1 tablet (50 mg total) by mouth daily. 90 tablet 3   No current facility-administered medications for this visit.     Past Medical History:  Diagnosis Date  . Anemia   . Atrial fibrillation   . Cardiomyopathy ([place]) 2010   Unclear Etiology: Last Echo 06/2009: EF 40-45%, severe Lateral & apical Hypokinesis (? CAD) ; Grade 2 DDysfxn. Mild conc LVH.   Marland Kitchen Cellulitis   . Chronic kidney disease (CKD), stage V ([place])    Dialysis Tue, Thurs, Sat  . Dyslipidemia   . Enterobacter sepsis ([place])   . Hypertension   . IDDM (insulin dependent diabetes mellitus) 11/03/2014  . S/P ICD (internal cardiac defibrillator) procedure 2010   VT Arrest (in [place])  . Schizophrenia ([place])   . Wegener's disease, pulmonary ([place]) 02/05/2013  . Wegener's granulomatosis ([place])     ROS:   All systems reviewed and negative except as noted in the HPI.   Past Surgical History:  Procedure Laterality Date  . AV FISTULA PLACEMENT Right 02/10/2013   Procedure: ARTERIOVENOUS (AV) FISTULA CREATION;  Surgeon: Rosetta Posner,  MD;  Location: [facility] OR;  Service: Vascular;  Laterality: Right;  Right forearm radial/cephalic arterovenous fistula.   Marland Kitchen CARDIAC CATHETERIZATION  2010   [place]: In setting of VT arrest. Per brother's report, nonobstructive with no intervention  . CARDIAC DEFIBRILLATOR PLACEMENT  2010   [place]  . FISTULOGRAM Right 08/09/2013   Procedure: FISTULOGRAM;  Surgeon: Angelia Mould, MD;  Location: [facility] CATH LAB;  Service: Cardiovascular;  Laterality: Right;  . ICD GENERATOR CHANGEOUT N/A 12/05/2017   Procedure: ICD GENERATOR CHANGEOUT;  Surgeon: Evans Lance, MD;  Location: [place] CV LAB;  Service: Cardiovascular;  Laterality: N/A;  . LIGATION OF COMPETING BRANCHES OF ARTERIOVENOUS FISTULA Right 08/13/2013   Procedure: LIGATION OF COMPETING BRANCHES X5 OF ARTERIOVENOUS FISTULA- RIGHT ARM;  Surgeon: Serafina Mitchell, MD;  Location: [place] OR;  Service: Vascular;  Laterality: Right;     Family History  Problem Relation Age of Onset  . CAD Mother      Social History   Socioeconomic History  . Marital status: Single    Spouse name: Not on file  . Number of children: Not on file  . Years of education: Not on file  . Highest education level: Not on file  Occupational History  . Not on file  Tobacco Use  . Smoking status: Former Smoker    Quit date: 10/28/2008    Years since  quitting: 11.1  . Smokeless tobacco: Never Used  Substance and Sexual Activity  . Alcohol use: No  . Drug use: No  . Sexual activity: Not on file  Other Topics Concern  . Not on file  Social History Narrative  . Not on file   Social Determinants of Health   Financial Resource Strain:   . Difficulty of Paying Living Expenses: Not on file  Food Insecurity:   . Worried About Charity fundraiser in the Last Year: Not on file  . Ran Out of Food in the Last Year: Not on file  Transportation Needs:   . Lack of Transportation (Medical): Not on file  . Lack of Transportation (Non-Medical): Not on file  Physical  Activity:   . Days of Exercise per Week: Not on file  . Minutes of Exercise per Session: Not on file  Stress:   . Feeling of Stress : Not on file  Social Connections:   . Frequency of Communication with Friends and Family: Not on file  . Frequency of Social Gatherings with Friends and Family: Not on file  . Attends Religious Services: Not on file  . Active Member of Clubs or Organizations: Not on file  . Attends Archivist Meetings: Not on file  . Marital Status: Not on file  Intimate Partner Violence:   . Fear of Current or Ex-Partner: Not on file  . Emotionally Abused: Not on file  . Physically Abused: Not on file  . Sexually Abused: Not on file     BP (!) 156/84   Pulse 80   Ht 5\' 8"  (1.727 m)   Wt 171 lb (77.6 kg)   SpO2 99%   BMI 26.00 kg/m   Physical Exam:  Well appearing NAD HEENT: Unremarkable Neck:  No JVD, no thyromegally Lymphatics:  No adenopathy Back:  No CVA tenderness Lungs:  Clear with no wheezes HEART:  Regular rate rhythm, no murmurs, no rubs, no clicks Abd:  soft, positive bowel sounds, no organomegally, no rebound, no guarding Ext:  2 plus pulses, no edema, no cyanosis, no clubbing Skin:  No rashes no nodules Neuro:  CN II through XII intact, motor grossly intact  EKG - NSR   DEVICE  Decreased R waves and elevated pacing thresholds.   Assess/Plan: 1. VF arrest - he has not had any additional symptoms.  2. ICD - his ICD lead has developed worsening R waves and increasing pacing thresholds. I have recommended ICD lead revision where we will plan to remove his old lead and insert a new one. 3. ESRD - he will arrive late to HD after his lead revision.  Mikle Bosworth.D.

## 2020-01-01 DIAGNOSIS — D509 Iron deficiency anemia, unspecified: Secondary | ICD-10-CM | POA: Diagnosis not present

## 2020-01-01 DIAGNOSIS — D631 Anemia in chronic kidney disease: Secondary | ICD-10-CM | POA: Diagnosis not present

## 2020-01-01 DIAGNOSIS — N2581 Secondary hyperparathyroidism of renal origin: Secondary | ICD-10-CM | POA: Diagnosis not present

## 2020-01-01 DIAGNOSIS — Z992 Dependence on renal dialysis: Secondary | ICD-10-CM | POA: Diagnosis not present

## 2020-01-01 DIAGNOSIS — N186 End stage renal disease: Secondary | ICD-10-CM | POA: Diagnosis not present

## 2020-01-01 LAB — CBC WITH DIFFERENTIAL/PLATELET
Basophils Absolute: 0 10*3/uL (ref 0.0–0.2)
Basos: 1 %
EOS (ABSOLUTE): 0.4 10*3/uL (ref 0.0–0.4)
Eos: 6 %
Hematocrit: 28 % — ABNORMAL LOW (ref 37.5–51.0)
Hemoglobin: 9.5 g/dL — ABNORMAL LOW (ref 13.0–17.7)
Immature Grans (Abs): 0 10*3/uL (ref 0.0–0.1)
Immature Granulocytes: 0 %
Lymphocytes Absolute: 2.2 10*3/uL (ref 0.7–3.1)
Lymphs: 35 %
MCH: 30.4 pg (ref 26.6–33.0)
MCHC: 33.9 g/dL (ref 31.5–35.7)
MCV: 90 fL (ref 79–97)
Monocytes Absolute: 0.7 10*3/uL (ref 0.1–0.9)
Monocytes: 11 %
Neutrophils Absolute: 3.1 10*3/uL (ref 1.4–7.0)
Neutrophils: 47 %
Platelets: 172 10*3/uL (ref 150–450)
RBC: 3.13 x10E6/uL — ABNORMAL LOW (ref 4.14–5.80)
RDW: 13.2 % (ref 11.6–15.4)
WBC: 6.3 10*3/uL (ref 3.4–10.8)

## 2020-01-01 LAB — BASIC METABOLIC PANEL
BUN/Creatinine Ratio: 3 — ABNORMAL LOW (ref 9–20)
BUN: 24 mg/dL (ref 6–24)
CO2: 26 mmol/L (ref 20–29)
Calcium: 8.9 mg/dL (ref 8.7–10.2)
Chloride: 102 mmol/L (ref 96–106)
Creatinine, Ser: 7.16 mg/dL — ABNORMAL HIGH (ref 0.76–1.27)
GFR calc Af Amer: 9 mL/min/{1.73_m2} — ABNORMAL LOW (ref >59)
GFR calc non Af Amer: 8 mL/min/{1.73_m2} — ABNORMAL LOW (ref >59)
Glucose: 72 mg/dL (ref 65–99)
Potassium: 4.4 mmol/L (ref 3.5–5.2)
Sodium: 145 mmol/L — ABNORMAL HIGH (ref 134–144)

## 2020-01-04 DIAGNOSIS — N2581 Secondary hyperparathyroidism of renal origin: Secondary | ICD-10-CM | POA: Diagnosis not present

## 2020-01-04 DIAGNOSIS — Z992 Dependence on renal dialysis: Secondary | ICD-10-CM | POA: Diagnosis not present

## 2020-01-04 DIAGNOSIS — D509 Iron deficiency anemia, unspecified: Secondary | ICD-10-CM | POA: Diagnosis not present

## 2020-01-04 DIAGNOSIS — D631 Anemia in chronic kidney disease: Secondary | ICD-10-CM | POA: Diagnosis not present

## 2020-01-04 DIAGNOSIS — N186 End stage renal disease: Secondary | ICD-10-CM | POA: Diagnosis not present

## 2020-01-05 ENCOUNTER — Telehealth: Payer: Self-pay

## 2020-01-05 NOTE — Telephone Encounter (Signed)
Call placed to Pt's brother.  Advised his procedure was scheduled for January 10, 2020 with arrival time of 1:00 pm.  Scheduled covid test for January 07, 2020 at 9:00 am.  Pt's brother has instruction letter from recent [place].  Work up complete.

## 2020-01-06 DIAGNOSIS — D509 Iron deficiency anemia, unspecified: Secondary | ICD-10-CM | POA: Diagnosis not present

## 2020-01-06 DIAGNOSIS — N2581 Secondary hyperparathyroidism of renal origin: Secondary | ICD-10-CM | POA: Diagnosis not present

## 2020-01-06 DIAGNOSIS — N186 End stage renal disease: Secondary | ICD-10-CM | POA: Diagnosis not present

## 2020-01-06 DIAGNOSIS — D631 Anemia in chronic kidney disease: Secondary | ICD-10-CM | POA: Diagnosis not present

## 2020-01-06 DIAGNOSIS — Z992 Dependence on renal dialysis: Secondary | ICD-10-CM | POA: Diagnosis not present

## 2020-01-07 ENCOUNTER — Emergency Department (HOSPITAL_COMMUNITY): Payer: Medicare Other

## 2020-01-07 ENCOUNTER — Other Ambulatory Visit: Payer: Self-pay

## 2020-01-07 ENCOUNTER — Inpatient Hospital Stay (HOSPITAL_COMMUNITY)
Admission: EM | Admit: 2020-01-07 | Discharge: 2020-01-11 | DRG: 981 | Disposition: A | Discharge status: Home / Self Care | Payer: Medicare Other | Attending: Internal Medicine | Admitting: Internal Medicine

## 2020-01-07 ENCOUNTER — Encounter (HOSPITAL_COMMUNITY): Payer: Self-pay | Admitting: Internal Medicine

## 2020-01-07 ENCOUNTER — Other Ambulatory Visit (HOSPITAL_COMMUNITY)
Admission: RE | Admit: 2020-01-07 | Discharge: 2020-01-07 | Disposition: A | Discharge status: Home / Self Care | Payer: Medicare Other | Source: Ambulatory Visit | Attending: Internal Medicine | Admitting: Internal Medicine

## 2020-01-07 ENCOUNTER — Encounter (HOSPITAL_COMMUNITY): Payer: Self-pay | Admitting: Emergency Medicine

## 2020-01-07 DIAGNOSIS — Z8674 Personal history of sudden cardiac arrest: Secondary | ICD-10-CM

## 2020-01-07 DIAGNOSIS — E1122 Type 2 diabetes mellitus with diabetic chronic kidney disease: Secondary | ICD-10-CM | POA: Diagnosis present

## 2020-01-07 DIAGNOSIS — N186 End stage renal disease: Secondary | ICD-10-CM

## 2020-01-07 DIAGNOSIS — Z79899 Other long term (current) drug therapy: Secondary | ICD-10-CM

## 2020-01-07 DIAGNOSIS — D631 Anemia in chronic kidney disease: Secondary | ICD-10-CM | POA: Diagnosis present

## 2020-01-07 DIAGNOSIS — Z95810 Presence of automatic (implantable) cardiac defibrillator: Secondary | ICD-10-CM

## 2020-01-07 DIAGNOSIS — I5022 Chronic systolic (congestive) heart failure: Secondary | ICD-10-CM

## 2020-01-07 DIAGNOSIS — E8889 Other specified metabolic disorders: Secondary | ICD-10-CM | POA: Diagnosis present

## 2020-01-07 DIAGNOSIS — N2581 Secondary hyperparathyroidism of renal origin: Secondary | ICD-10-CM | POA: Diagnosis present

## 2020-01-07 DIAGNOSIS — I5082 Biventricular heart failure: Secondary | ICD-10-CM | POA: Diagnosis present

## 2020-01-07 DIAGNOSIS — R079 Chest pain, unspecified: Secondary | ICD-10-CM | POA: Diagnosis not present

## 2020-01-07 DIAGNOSIS — Z20822 Contact with and (suspected) exposure to covid-19: Secondary | ICD-10-CM | POA: Diagnosis not present

## 2020-01-07 DIAGNOSIS — I4901 Ventricular fibrillation: Secondary | ICD-10-CM | POA: Diagnosis not present

## 2020-01-07 DIAGNOSIS — I2510 Atherosclerotic heart disease of native coronary artery without angina pectoris: Secondary | ICD-10-CM | POA: Diagnosis present

## 2020-01-07 DIAGNOSIS — E1151 Type 2 diabetes mellitus with diabetic peripheral angiopathy without gangrene: Secondary | ICD-10-CM | POA: Diagnosis present

## 2020-01-07 DIAGNOSIS — I480 Paroxysmal atrial fibrillation: Secondary | ICD-10-CM | POA: Diagnosis present

## 2020-01-07 DIAGNOSIS — N189 Chronic kidney disease, unspecified: Secondary | ICD-10-CM | POA: Diagnosis present

## 2020-01-07 DIAGNOSIS — Z794 Long term (current) use of insulin: Secondary | ICD-10-CM

## 2020-01-07 DIAGNOSIS — I4510 Unspecified right bundle-branch block: Secondary | ICD-10-CM | POA: Diagnosis present

## 2020-01-07 DIAGNOSIS — I214 Non-ST elevation (NSTEMI) myocardial infarction: Secondary | ICD-10-CM | POA: Diagnosis present

## 2020-01-07 DIAGNOSIS — I071 Rheumatic tricuspid insufficiency: Secondary | ICD-10-CM | POA: Diagnosis present

## 2020-01-07 DIAGNOSIS — Z8249 Family history of ischemic heart disease and other diseases of the circulatory system: Secondary | ICD-10-CM

## 2020-01-07 DIAGNOSIS — Z992 Dependence on renal dialysis: Secondary | ICD-10-CM | POA: Diagnosis not present

## 2020-01-07 DIAGNOSIS — Z87891 Personal history of nicotine dependence: Secondary | ICD-10-CM

## 2020-01-07 DIAGNOSIS — Y712 Prosthetic and other implants, materials and accessory cardiovascular devices associated with adverse incidents: Secondary | ICD-10-CM | POA: Diagnosis present

## 2020-01-07 DIAGNOSIS — M3130 Wegener's granulomatosis without renal involvement: Secondary | ICD-10-CM | POA: Diagnosis not present

## 2020-01-07 DIAGNOSIS — I428 Other cardiomyopathies: Secondary | ICD-10-CM | POA: Diagnosis present

## 2020-01-07 DIAGNOSIS — I132 Hypertensive heart and chronic kidney disease with heart failure and with stage 5 chronic kidney disease, or end stage renal disease: Secondary | ICD-10-CM | POA: Diagnosis not present

## 2020-01-07 DIAGNOSIS — F209 Schizophrenia, unspecified: Secondary | ICD-10-CM | POA: Diagnosis present

## 2020-01-07 DIAGNOSIS — E785 Hyperlipidemia, unspecified: Secondary | ICD-10-CM | POA: Diagnosis present

## 2020-01-07 DIAGNOSIS — T82598A Other mechanical complication of other cardiac and vascular devices and implants, initial encounter: Secondary | ICD-10-CM | POA: Diagnosis present

## 2020-01-07 DIAGNOSIS — E1129 Type 2 diabetes mellitus with other diabetic kidney complication: Secondary | ICD-10-CM | POA: Diagnosis present

## 2020-01-07 LAB — CBC
HCT: 27.8 % — ABNORMAL LOW (ref 39.0–52.0)
Hemoglobin: 8.7 g/dL — ABNORMAL LOW (ref 13.0–17.0)
MCH: 29.4 pg (ref 26.0–34.0)
MCHC: 31.3 g/dL (ref 30.0–36.0)
MCV: 93.9 fL (ref 80.0–100.0)
Platelets: 175 10*3/uL (ref 150–400)
RBC: 2.96 MIL/uL — ABNORMAL LOW (ref 4.22–5.81)
RDW: 13.4 % (ref 11.5–15.5)
WBC: 6.2 10*3/uL (ref 4.0–10.5)
nRBC: 0 % (ref 0.0–0.2)

## 2020-01-07 LAB — BASIC METABOLIC PANEL
Anion gap: 15 (ref 5–15)
BUN: 34 mg/dL — ABNORMAL HIGH (ref 6–20)
CO2: 27 mmol/L (ref 22–32)
Calcium: 8.7 mg/dL — ABNORMAL LOW (ref 8.9–10.3)
Chloride: 99 mmol/L (ref 98–111)
Creatinine, Ser: 8.48 mg/dL — ABNORMAL HIGH (ref 0.61–1.24)
GFR calc Af Amer: 7 mL/min — ABNORMAL LOW (ref >60)
GFR calc non Af Amer: 6 mL/min — ABNORMAL LOW (ref >60)
Glucose, Bld: 103 mg/dL — ABNORMAL HIGH (ref 70–99)
Potassium: 4.5 mmol/L (ref 3.5–5.1)
Sodium: 141 mmol/L (ref 135–145)

## 2020-01-07 LAB — SARS CORONAVIRUS 2 (TAT 6-24 HRS): SARS Coronavirus 2: NEGATIVE

## 2020-01-07 LAB — TROPONIN I (HIGH SENSITIVITY)
Troponin I (High Sensitivity): 41 ng/L — ABNORMAL HIGH (ref <18)
Troponin I (High Sensitivity): 39 ng/L — ABNORMAL HIGH (ref <18)

## 2020-01-07 LAB — PROTIME-INR
INR: 1.1 (ref 0.8–1.2)
Prothrombin Time: 13.9 seconds (ref 11.4–15.2)

## 2020-01-07 MED ORDER — SODIUM CHLORIDE 0.9 % IV SOLN: 1.0000 g | INTRAVENOUS | Status: DC

## 2020-01-07 MED ORDER — HEPARIN (PORCINE) 25000 UT/250ML-% IV SOLN
1650.0000 [IU]/h | INTRAVENOUS | Status: DC
  Administered 2020-01-08: 1000 [IU]/h via INTRAVENOUS
  Administered 2020-01-09 – 2020-01-10 (×2): 1600 [IU]/h via INTRAVENOUS
  Filled 2020-01-07 (×4): qty 250

## 2020-01-07 MED ORDER — NITROGLYCERIN IN D5W 200-5 MCG/ML-% IV SOLN
0.0000 ug/min | INTRAVENOUS | Status: DC
  Administered 2020-01-08: 90 ug/min via INTRAVENOUS
  Administered 2020-01-08: 5 ug/min via INTRAVENOUS
  Filled 2020-01-07 (×5): qty 250

## 2020-01-07 MED ORDER — SODIUM CHLORIDE 0.9 % IV SOLN: 500.0000 mg | INTRAVENOUS | Status: DC

## 2020-01-07 MED ORDER — ENOXAPARIN SODIUM 40 MG/0.4ML ~~LOC~~ SOLN: 40.0000 mg | SUBCUTANEOUS | Status: DC

## 2020-01-07 MED ORDER — SODIUM CHLORIDE 0.9% FLUSH: 3.0000 mL | Freq: Once | INTRAVENOUS | Status: DC

## 2020-01-07 MED ORDER — HEPARIN BOLUS VIA INFUSION
4000.0000 [IU] | Freq: Once | INTRAVENOUS | Status: AC
  Administered 2020-01-08: 4000 [IU] via INTRAVENOUS
  Filled 2020-01-07: qty 4000

## 2020-01-07 NOTE — ED Triage Notes (Signed)
Patient reports left chest pain with SOB this morning , no emesis or diaphoresis , HD q Tues/Thurs/Sat , scheduled for defibrillator replacement this coming Monday . His cardiologist is Dr. Lovena Le.

## 2020-01-07 NOTE — Progress Notes (Signed)
Spoke with pt's brother, Velcro for pre-op call. DPR on file. Pt has hx of schizophrenia. Pt lives with his brother. Pt has hx of Ventricular fibrillation and has an ICD. Dr. Lovena Le is his cardiologist. No recent chest pain or sob per his brother. Pt is a type 2 diabetic, on dialysis (T/ThSa). Pt no longer taking diabetic medications, last A1C was 4.8 on 09/30/19 (at Dialysis center - called today to get results). Pt does not check his blood sugar at home.   Covid test done today. Velcro voices understanding of quarantine instructions and that it is ok for pt to go to dialysis tomorrow as long as he wears his mask, washes hands and social distance.   Periop Rx for ICD programming faxed to the [facility].

## 2020-01-07 NOTE — ED Provider Notes (Signed)
[facility] Provider Note   CSN: [id number] Arrival date & time: 01/07/20  1920     History Chief Complaint  Patient presents with  . Chest Pain    James Nicholson is a 54 y.o. male.  The history is provided by the patient. No language interpreter was used.  Chest Pain Pain location:  L lateral chest Pain quality: aching   Pain radiates to:  Does not radiate Pain Nicholson:  Moderate Onset quality:  Gradual Timing:  Constant Progression:  Worsening Chronicity:  New Relieved by:  Nothing Worsened by:  Nothing Ineffective treatments:  None tried Associated symptoms: no vomiting   Risk factors: diabetes mellitus, high cholesterol and hypertension   Pt reports he began having shortness of breath and pain in the left side of his chest.  Pt reports he is scheduled to have a procedure on Monday with his cardiologist. Pt reports he has had similar pain in the past but not in over a year.   Pt had dialysis yesterday.  He was down to his normal weight after. Pt has dialysis in [place] on tues, Thur and Saturday     Past Medical History:  Diagnosis Date  . Anemia   . Atrial fibrillation   . Cardiomyopathy ([place]) 2010   Unclear Etiology: Last Echo 06/2009: EF 40-45%, severe Lateral & apical Hypokinesis (? CAD) ; Grade 2 DDysfxn. Mild conc LVH.   Marland Kitchen Cellulitis   . Chronic kidney disease (CKD), stage V ([place])    Dialysis Tue, Thurs, Sat  . Dyslipidemia   . Enterobacter sepsis ([place])   . Hypertension   . IDDM (insulin dependent diabetes mellitus) 11/03/2014  . Pneumonia   . S/P ICD (internal cardiac defibrillator) procedure 2010   VT Arrest (in [place])  . Schizophrenia ([place])   . 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 Wegener's granulomatosis [facility])     Patient Active Problem List   Diagnosis Date Noted  . ICD (implantable cardioverter-defibrillator) lead failure 12/31/2019  . Eye pain 12/16/2018  . Wegener's granulomatosis ([place])  12/15/2018  . Eye pain, right 12/15/2018  . Type II diabetes mellitus with renal manifestations ([place]) 12/15/2018  . GERD (gastroesophageal reflux disease) 12/15/2018  . Orbital mass 10/08/2018  . Sinusitis 10/08/2018  . Redness of right eye 10/08/2018  . Ocular proptosis 10/08/2018  . Pulmonary nodule, left 10/08/2018  . Cough   . Lung nodules--left upper lobe and left posterior lobe- need to rule out malignancy 10/05/2018  . Conjunctivitis 09/27/2018  . Chronic kidney disease   . Hyperlipidemia   . Sepsis ([place]) 11/03/2014  . IDDM (insulin dependent diabetes mellitus) ([place]) 11/03/2014  . Troponin level elevated 12/22/2013    Class: Acute  . Mechanical complication of other vascular device, implant, and graft 08/04/2013  . Other complications due to renal dialysis device, implant, and graft 08/04/2013  . ESRD on dialysis ([place]) 04/13/2013  . Anemia in chronic kidney disease 02/05/2013  . Pneumonia 02/05/2013  . GI bleed 02/05/2013  . Metabolic acidosis 29/79/8921  . 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 PPD positive - completed 9 months INH (per Dr. Linus Salmons) 05/25/2012  . Other primary cardiomyopathies 05/07/2012  . H/o Ventricular fibrillation Arrest now with AICD 05/07/2012    Class: History of  . Hypertension 05/07/2012  . Automatic implantable cardioverter-defibrillator in situ 05/07/2012    Past Surgical History:  Procedure Laterality Date  . AV FISTULA PLACEMENT Right 02/10/2013   Procedure: ARTERIOVENOUS (AV) FISTULA CREATION;  Surgeon: Rosetta Posner, MD;  Location: [facility] OR;  Service: Vascular;  Laterality: Right;  Right forearm radial/cephalic arterovenous fistula.   Marland Kitchen CARDIAC CATHETERIZATION  2010   [place]: In setting of VT arrest. Per brother's report, nonobstructive with no intervention  . CARDIAC DEFIBRILLATOR PLACEMENT  2010   [place]  . FISTULOGRAM Right 08/09/2013   Procedure: FISTULOGRAM;  Surgeon: Angelia Mould, MD;  Location: [facility] CATH LAB;   Service: Cardiovascular;  Laterality: Right;  . ICD GENERATOR CHANGEOUT N/A 12/05/2017   Procedure: ICD GENERATOR CHANGEOUT;  Surgeon: Evans Lance, MD;  Location: [place] CV LAB;  Service: Cardiovascular;  Laterality: N/A;  . LIGATION OF COMPETING BRANCHES OF ARTERIOVENOUS FISTULA Right 08/13/2013   Procedure: LIGATION OF COMPETING BRANCHES X5 OF ARTERIOVENOUS FISTULA- RIGHT ARM;  Surgeon: Serafina Mitchell, MD;  Location: [place] OR;  Service: Vascular;  Laterality: Right;       Family History  Problem Relation Age of Onset  . CAD Mother     Social History   Tobacco Use  . Smoking status: Former Smoker    Quit date: 10/28/2008    Years since quitting: 11.2  . Smokeless tobacco: Never Used  Substance Use Topics  . Alcohol use: No  . Drug use: No    Home Medications Prior to Admission medications   Medication Sig Start Date End Date Taking? Authorizing Provider  atorvastatin (LIPITOR) 20 MG tablet Take 1 tablet (20 mg total) by mouth daily. 12/31/19   Evans Lance, MD  AURYXIA 1 GM 210 MG(Fe) tablet Take 420 mg by mouth 3 (three) times daily. 11/24/19   [provider]  calcium acetate (PHOSLO) 667 MG capsule Take 2 capsules (1,334 mg total) by mouth 3 (three) times daily with meals. 8a, 12p, 5p Patient not taking: Reported on 01/06/2020 11/09/18   Medina-Vargas, Monina C, NP  metoprolol succinate (TOPROL-XL) 50 MG 24 hr tablet Take 1 tablet (50 mg total) by mouth daily. 12/31/19   Evans Lance, MD    Allergies    Patient has no known allergies.  Review of Systems   Review of Systems  Cardiovascular: Positive for chest pain.  Gastrointestinal: Negative for vomiting.  All other systems reviewed and are negative.   Physical Exam Updated Vital Signs BP (!) 148/86   Pulse 95   Temp 99.2 F (37.3 C) (Oral)   Resp (!) 23   Ht 5\' 9"  (1.753 m)   Wt 80 kg   SpO2 97%   BMI 26.05 kg/m   Physical Exam Vitals and nursing note reviewed.  Constitutional:       Appearance: He is well-developed.  HENT:     Head: Normocephalic and atraumatic.  Eyes:     Conjunctiva/sclera: Conjunctivae normal.  Cardiovascular:     Rate and Rhythm: Normal rate and regular rhythm.     Heart sounds: Normal heart sounds. No murmur.  Pulmonary:     Effort: Pulmonary effort is normal. No respiratory distress.     Breath sounds: Normal breath sounds.  Abdominal:     Palpations: Abdomen is soft.     Tenderness: There is no abdominal tenderness.  Musculoskeletal:        General: Normal range of motion.     Cervical back: Normal range of motion and neck supple.     Comments: Fistula right arm.  Good thrill   Skin:    General: Skin is warm and dry.  Neurological:     General: No focal deficit  present.     Mental Status: He is alert.  Psychiatric:        Mood and Affect: Mood normal.     ED Results / Procedures / Treatments   Labs (all labs ordered are listed, but only abnormal results are displayed) Labs Reviewed  BASIC METABOLIC PANEL - Abnormal; Notable for the following components:      Result Value   Glucose, Bld 103 (*)    BUN 34 (*)    Creatinine, Ser 8.48 (*)    Calcium 8.7 (*)    GFR calc non Af Amer 6 (*)    GFR calc Af Amer 7 (*)    All other components within normal limits  CBC - Abnormal; Notable for the following components:   RBC 2.96 (*)    Hemoglobin 8.7 (*)    HCT 27.8 (*)    All other components within normal limits  TROPONIN I (HIGH SENSITIVITY) - Abnormal; Notable for the following components:   Troponin I (High Sensitivity) 41 (*)    All other components within normal limits  PROTIME-INR  TROPONIN I (HIGH SENSITIVITY)    EKG EKG Interpretation  Date/Time:  Friday January 07 2020 19:33:11 EST Ventricular Rate:  96 PR Interval:  116 QRS Duration: 128 QT Interval:  388 QTC Calculation: 490 R Axis:   -78 Text Interpretation:    Radiology DG Chest 2 View  Result Date: 01/07/2020 CLINICAL DATA:  54 year old male with chest  pain and shortness of breath. EXAM: CHEST - 2 VIEW COMPARISON:  Chest radiograph dated 11/10/2019. FINDINGS: There are bibasilar linear atelectasis/scarring. No focal consolidation, pleural effusion, or pneumothorax. Stable cardiomegaly. Left pectoral AICD device. No acute osseous pathology. IMPRESSION: 1. No acute cardiopulmonary process. 2. Stable cardiomegaly. Electronically Signed   By: Anner Crete M.D.   On: 01/07/2020 20:15    Procedures Procedures (including critical care time)  Medications Ordered in ED Medications  sodium chloride flush (NS) 0.9 % injection 3 mL (3 mLs Intravenous Not Given 01/07/20 2105)    ED Course  I have reviewed the triage vital signs and the nursing notes.  Pertinent labs & imaging results that were available during my care of the patient were reviewed by me and considered in my medical decision making (see chart for details).    MDM Rules/Calculators/A&P                      MDM:  Cardiology consulted and will see pt here.  Cardiologist request hospitalist admission. Hospitalist  Dr. Flossie Buffy will see and admit  Final Clinical Impression(s) / ED Diagnoses Final diagnoses:  Chest pain, unspecified type    Rx / DC Orders ED Discharge Orders    None       Sidney Ace 01/07/20 2325    Quintella Reichert, MD 01/08/20 [id number]

## 2020-01-07 NOTE — Consult Note (Signed)
Cardiology Consultation:   Patient ID: James Nicholson MRN: [id number]; DOB: 1966/02/04  Admit date: 01/07/2020 Date of Consult: 01/07/2020  Primary Care Provider: [name], MD Primary Cardiologist: No primary care provider on file.   Primary Electrophysiologist:  Dr. Lovena Le   Patient Profile:   James Nicholson is a 54 y.o. male with a hx of A. Fib, VT arrest s/p ICD, schizophrenia, Wegners granulomatosis, HLD, HTN and ESRD who is being seen today for the evaluation of chest pain at the request of the ER.  History of Present Illness:   Mr. Woodrick states that he started having chest pain approximately two days ago that radiated to his left arm. His pain has not subsided and is worse with exertion. He also endorses ongoing shortness of breath that is also worse with exertion. He was able to tolerate a full dialysis session on Thursday and is due for his next session tomorrow. He notes some lightheadedness. He denies any palpitations or ICD shocks. He has no history of coronary disease and no family history of heart problems. He denies any nausea, lower leg swelling, orthopnea or PND. He has ongoing pain now. He has been on dialysis since 2014.  He does have a history of VT arrest in [place] but he is unable to tell me any of the details of this. He is scheduled to undergo an ICD lead revision on Monday.  History is obtained with the help of an interpreter.    Past Medical History:  Diagnosis Date  . Anemia   . Atrial fibrillation   . Cardiomyopathy ([place]) 2010   Unclear Etiology: Last Echo 06/2009: EF 40-45%, severe Lateral & apical Hypokinesis (? CAD) ; Grade 2 DDysfxn. Mild conc LVH.   Marland Kitchen Cellulitis   . Chronic kidney disease (CKD), stage V ([place])    Dialysis Tue, Thurs, Sat  . Dyslipidemia   . Enterobacter sepsis ([place])   . Hypertension   . IDDM (insulin dependent diabetes mellitus) 11/03/2014  . Pneumonia   . S/P ICD (internal cardiac defibrillator) procedure 2010   VT Arrest (in [place])  . Schizophrenia ([place])   . 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 Wegener's granulomatosis ([place])     Past Surgical History:  Procedure Laterality Date  . AV FISTULA PLACEMENT Right 02/10/2013   Procedure: ARTERIOVENOUS (AV) FISTULA CREATION;  Surgeon: Rosetta Posner, MD;  Location: [facility] OR;  Service: Vascular;  Laterality: Right;  Right forearm radial/cephalic arterovenous fistula.   Marland Kitchen CARDIAC CATHETERIZATION  2010   [place]: In setting of VT arrest. Per brother's report, nonobstructive with no intervention  . CARDIAC DEFIBRILLATOR PLACEMENT  2010   [place]  . FISTULOGRAM Right 08/09/2013   Procedure: FISTULOGRAM;  Surgeon: Angelia Mould, MD;  Location: [facility] CATH LAB;  Service: Cardiovascular;  Laterality: Right;  . ICD GENERATOR CHANGEOUT N/A 12/05/2017   Procedure: ICD GENERATOR CHANGEOUT;  Surgeon: Evans Lance, MD;  Location: [place] CV LAB;  Service: Cardiovascular;  Laterality: N/A;  . LIGATION OF COMPETING BRANCHES OF ARTERIOVENOUS FISTULA Right 08/13/2013   Procedure: LIGATION OF COMPETING BRANCHES X5 OF ARTERIOVENOUS FISTULA- RIGHT ARM;  Surgeon: Serafina Mitchell, MD;  Location: [facility] OR;  Service: Vascular;  Laterality: Right;     Home Medications:  Prior to Admission medications   Medication Sig Start Date End Date Taking? Authorizing Provider  atorvastatin (LIPITOR) 20 MG tablet Take 1 tablet (20 mg total) by mouth daily. 12/31/19   Evans Lance, MD  AURYXIA 1 GM 210 MG(Fe)  tablet Take 420 mg by mouth 3 (three) times daily. 11/24/19   [provider]  calcium acetate (PHOSLO) 667 MG capsule Take 2 capsules (1,334 mg total) by mouth 3 (three) times daily with meals. 8a, 12p, 5p Patient not taking: Reported on 01/06/2020 11/09/18   Medina-Vargas, Monina C, NP  metoprolol succinate (TOPROL-XL) 50 MG 24 hr tablet Take 1 tablet (50 mg total) by mouth daily. 12/31/19   Evans Lance, MD    Inpatient Medications: Scheduled Meds: .  heparin  4,000 Units Intravenous Once  . sodium chloride flush  3 mL Intravenous Once   Continuous Infusions: . heparin    . nitroGLYCERIN     PRN Meds:   Allergies:   No Known Allergies  Social History:   Social History   Socioeconomic History  . Marital status: Single    Spouse name: Not on file  . Number of children: Not on file  . Years of education: Not on file  . Highest education level: Not on file  Occupational History  . Not on file  Tobacco Use  . Smoking status: Former Smoker    Quit date: 10/28/2008    Years since quitting: 11.2  . Smokeless tobacco: Never Used  Substance and Sexual Activity  . Alcohol use: No  . Drug use: No  . Sexual activity: Not on file  Other Topics Concern  . Not on file  Social History Narrative  . Not on file   Social Determinants of Health   Financial Resource Strain:   . Difficulty of Paying Living Expenses:   Food Insecurity:   . Worried About Charity fundraiser in the Last Year:   . Arboriculturist in the Last Year:   Transportation Needs:   . Film/video editor (Medical):   Marland Kitchen Lack of Transportation (Non-Medical):   Physical Activity:   . Days of Exercise per Week:   . Minutes of Exercise per Session:   Stress:   . Feeling of Stress :   Social Connections:   . Frequency of Communication with Friends and Family:   . Frequency of Social Gatherings with Friends and Family:   . Attends Religious Services:   . Active Member of Clubs or Organizations:   . Attends Archivist Meetings:   Marland Kitchen Marital Status:   Intimate Partner Violence:   . Fear of Current or Ex-Partner:   . Emotionally Abused:   Marland Kitchen Physically Abused:   . Sexually Abused:     Family History:    Family History  Problem Relation Age of Onset  . CAD Mother      ROS:  Please see the history of present illness.  All other ROS reviewed and negative.     Physical Exam/Data:   Vitals:   01/07/20 2230 01/07/20 2245 01/07/20 2300 01/07/20  2315  BP: (!) 148/87 (!) 158/87 (!) 150/89 (!) 157/88  Pulse: 93 94 94 95  Resp: 18 (!) 26 (!) 21 16  Temp:      TempSrc:      SpO2: 97% 97% 99% 99%  Weight:      Height:       No intake or output data in the 24 hours ending 01/07/20 2340 Last 3 Weights 01/07/2020 12/31/2019 11/24/2019  Weight (lbs) 176 lb 5.9 oz 171 lb 172 lb  Weight (kg) 80 kg 77.565 kg 78.019 kg     Body mass index is 26.05 kg/m.  General:  Well nourished, well  developed appears mildly uncomfortable HEENT: normal Neck: no JVD Vascular: No carotid bruits; FA pulses 2+ bilaterally without bruits  Cardiac:  Tachycardic, regular rhythm. Subtle S3 Lungs:  clear to auscultation bilaterally, no wheezing, rhonchi or rales. Mildly tachypneic  Abd: soft, nontender, no hepatomegaly  Ext: no edema Musculoskeletal:  No deformities, BUE and BLE strength normal and equal Skin: warm and dry  Neuro:  CNs 2-12 intact, no focal abnormalities noted Psych:  Normal affect   EKG:  The EKG was personally reviewed and demonstrates:  Normal sinus rhythm. RBBB and LAFB  Relevant CV Studies: Echo 11/2013- unable to see results  Laboratory Data:  High Sensitivity Troponin:   Recent Labs  Lab 01/07/20 1953 01/07/20 2201  TROPONINIHS 41* 39*     Chemistry Recent Labs  Lab 01/07/20 1953  NA 141  K 4.5  CL 99  CO2 27  GLUCOSE 103*  BUN 34*  CREATININE 8.48*  CALCIUM 8.7*  GFRNONAA 6*  GFRAA 7*  ANIONGAP 15    No results for input(s): PROT, ALBUMIN, AST, ALT, ALKPHOS, BILITOT in the last 168 hours. Hematology Recent Labs  Lab 01/07/20 1953  WBC 6.2  RBC 2.96*  HGB 8.7*  HCT 27.8*  MCV 93.9  MCH 29.4  MCHC 31.3  RDW 13.4  PLT 175   BNPNo results for input(s): BNP, PROBNP in the last 168 hours.  DDimer No results for input(s): DDIMER in the last 168 hours.   Radiology/Studies:  DG Chest 2 View  Result Date: 01/07/2020 CLINICAL DATA:  54 year old male with chest pain and shortness of breath. EXAM: CHEST - 2  VIEW COMPARISON:  Chest radiograph dated 11/10/2019. FINDINGS: There are bibasilar linear atelectasis/scarring. No focal consolidation, pleural effusion, or pneumothorax. Stable cardiomegaly. Left pectoral AICD device. No acute osseous pathology. IMPRESSION: 1. No acute cardiopulmonary process. 2. Stable cardiomegaly. Electronically Signed   By: Anner Crete M.D.   On: 01/07/2020 20:15   { TIMI Risk Score for Unstable Angina or Non-ST Elevation MI:   The patient's TIMI risk score is 4, which indicates a 20% risk of all cause mortality, new or recurrent myocardial infarction or need for urgent revascularization in the next 14 days.   Assessment and Plan:   Mr. Chavarria presents today with ongoing chest pain over the past two days with positive troponin consistent with NSTEMI. He certainly has a number of risk factors for coronary disease as well as personal history of VT arrest under unknown circumstances. His history is somewhat difficult to obtain due to language barriers. We discussed his symptoms and concern for heart problems. He is amenable to moving forward with catheterization if/when needed. Would recommend aggressive medical treatment at this time.  - Start lipitor 80mg  - Give aspirin 324mg  followed by 81mg  daily - Start heparin drip with bolus - Start nitro drip and titrate to chest pain free - Check HgA1c, TSH, proBNP and lipid panel - Continue to trend troponins - Will plan for LHC on Monday or earlier if we are unable to get him chest pain free  - Check echo       For questions or updates, please contact [facility] Please consult [email or web address] for contact info under     Signed, Princella Pellegrini, MD  01/07/2020 11:40 PM

## 2020-01-07 NOTE — Progress Notes (Signed)
Anesthesia Chart Review: SAME DAY WORK-UP   Case: [id number] Date/Time: 01/10/20 1441   Procedures:      ICD LEAD REMOVAL AND IMPLANTATION OF NEW LEAD (Bilateral Chest)     TRANSESOPHAGEAL ECHOCARDIOGRAM (TEE) (N/A )   Anesthesia type: General   Pre-op diagnosis: LEAD FAILURE   Location: [facility] OR ROOM 15 / [place] OR   Surgeons: Evans Lance, MD    Back-up CT surgeon is Dr. Orvan Seen.  DISCUSSION: Patient is a 54 year old male scheduled for the above procedure. He has an ICD placed in 2010. ICD lead replacement recommended because his ICD lead has developed worsening R waves and increasing pacing thresholds. (EP notes indicate that he will arrive late to HD after his lead revision.)  History includes former smoker (quit 2010), HTN, dyslipidemia, schizophrenia, atrial fibrillation, chronic systolic CHF, cardiomyopathy, V. tach arrest 02/04/09 (s/p Med[employer] ICD 01/2009), ESRD (+p- ANCA necrotizing glomerulonephritis 2010 s/p IV Cytoxan and prednisone; hemodialysis since ~ 2014), anemia, Wegener's disease, IDDM. +PPD (9 months INH prescribed per Scharlene Gloss, MD 05/22/12).  Notes indicate that he speaks the [place] language.  01/07/20 presurgical COVID-19 test in in process.  He has a same-day work-up so further evaluation on the day of surgery.   VS:   BP Readings from Last 3 Encounters:  12/31/19 (!) 156/84  11/24/19 120/76  01/05/19 130/72    PROVIDERS: [name], MD is PCP  Cristopher Peru, MD is EP cardiologist Nephrologist is with [facility] Brand Males, MD is pulmonologist   LABS: Last lab results include (CBC ordered by Dr. Lovena Le with [facility], Prepare PRBC orders for the day of surgery): Lab Results  Component Value Date   WBC 6.3 12/31/2019   HGB 9.5 (L) 12/31/2019   HCT 28.0 (L) 12/31/2019   PLT 172 12/31/2019   GLUCOSE 72 12/31/2019   ALT 18 01/05/2019   AST 16 01/05/2019   NA 145 (H) 12/31/2019   K 4.4 12/31/2019   CL 102 12/31/2019   CREATININE  7.16 (H) 12/31/2019   BUN 24 12/31/2019   CO2 26 12/31/2019   CBC Latest Ref Rng & Units 12/31/2019 01/05/2019 12/19/2018  WBC 3.4 - 10.8 x10E3/uL 6.3 11.6(H) 7.2  Hemoglobin 13.0 - 17.7 g/dL 9.5(L) 10.5(L) 9.8(L)  Hematocrit 37.5 - 51.0 % 28.0(L) 31.1(L) 30.1(L)  Platelets 150 - 450 x10E3/uL 172 138(L) 127(L)     IMAGES: 1V CXR 11/10/19: FINDINGS: Left-sided single lead pacing device as before. Linear scarring at the bases. No acute consolidation or effusion. Stable cardiomediastinal silhouette. No pneumothorax. IMPRESSION: No active cardiopulmonary disease. Stable scarring at the lung bases.    EKG: 12/31/19: NSR, RBB   CV: Echo 12/23/13: Study Conclusions  - Left ventricle: The cavity size was normal. Wall thickness  was normal. Systolic function was mildly reduced. The  estimated ejection fraction was in the range of 45% to  50%. Diffuse hypokinesis. Doppler parameters are  consistent with abnormal left ventricular relaxation  (grade 1 diastolic dysfunction).  - Mitral valve: Mild to moderate regurgitation.    Past Medical History:  Diagnosis Date  . Anemia   . Atrial fibrillation   . Cardiomyopathy ([place]) 2010   Unclear Etiology: Last Echo 06/2009: EF 40-45%, severe Lateral & apical Hypokinesis (? CAD) ; Grade 2 DDysfxn. Mild conc LVH.   Marland Kitchen Cellulitis   . Chronic kidney disease (CKD), stage V ([place])    Dialysis Tue, Thurs, Sat  . Dyslipidemia   . Enterobacter sepsis ([place])   . Hypertension   .  IDDM (insulin dependent diabetes mellitus) 11/03/2014  . Pneumonia   . S/P ICD (internal cardiac defibrillator) procedure 2010   VT Arrest (in [place])  . Schizophrenia ([place])   . 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 Wegener's granulomatosis ([place])     Past Surgical History:  Procedure Laterality Date  . AV FISTULA PLACEMENT Right 02/10/2013   Procedure: ARTERIOVENOUS (AV) FISTULA CREATION;  Surgeon: Rosetta Posner, MD;  Location: [facility] OR;  Service: Vascular;   Laterality: Right;  Right forearm radial/cephalic arterovenous fistula.   Marland Kitchen CARDIAC CATHETERIZATION  2010   [place]: In setting of VT arrest. Per brother's report, nonobstructive with no intervention  . CARDIAC DEFIBRILLATOR PLACEMENT  2010   [place]  . FISTULOGRAM Right 08/09/2013   Procedure: FISTULOGRAM;  Surgeon: Angelia Mould, MD;  Location: [facility] C[facility] LAB;  Service: Cardiovascular;  Laterality: Right;  . ICD GENERATOR CHANGEOUT N/A 12/05/2017   Procedure: ICD GENERATOR CHANGEOUT;  Surgeon: Evans Lance, MD;  Location: [place] CV LAB;  Service: Cardiovascular;  Laterality: N/A;  . LIGATION OF COMPETING BRANCHES OF ARTERIOVENOUS FISTULA Right 08/13/2013   Procedure: LIGATION OF COMPETING BRANCHES X5 OF ARTERIOVENOUS FISTULA- RIGHT ARM;  Surgeon: Serafina Mitchell, MD;  Location: [facility] OR;  Service: Vascular;  Laterality: Right;    MEDICATIONS: No current facility-administered medications for this encounter.   Marland Kitchen atorvastatin (LIPITOR) 20 MG tablet  . AURYXIA 1 GM 210 MG(Fe) tablet  . metoprolol succinate (TOPROL-XL) 50 MG 24 hr tablet  . calcium acetate (PHOSLO) 667 MG capsule    Myra Gianotti, PA-C Surgical Short Stay/Anesthesiology [facility] Phone [phone] [facility] Phone [phone] 01/07/2020 4:36 PM

## 2020-01-07 NOTE — ED Notes (Signed)
Per Dr. Flossie Buffy at bedside. RN to hold heparin and NTG drip till cardiology consult. MD to modify medications.

## 2020-01-07 NOTE — H&P (Signed)
History and Physical    James Nicholson [name] DOB: April 07, 1966 DOA: 01/07/2020  PCP: [name], MD  Patient coming from: Home, lives with brother  I have personally briefly reviewed patient's old medical records in [place]  Chief Complaint: chest pain and shortness of breath  HPI: James Nicholson is a 54 y.o. male with medical history significant for V. fib arrest s/p ICD, Chronic systolic heart failure,A.fib not on anticoagulation, Wegener's granulomatosis with ESRD on HD Tues/Thurs/Sat, peripheral vascular disease, type 2 diabetes, history of GI bleed and schizophrenia who presents with concerns of chest pain, shortness of breath and epigastric abdominal pain.  [place] translator was used to assist with obtaining HPI although unsure of accuracy of translation since patient appears to be confused when answering questions at times.  He reports that about 2 days ago he began to have left-sided chest pain as well as shortness of breath that is both at rest and with exertion.  It caused him to have difficulty sleeping.  He says that the pain is pressure-like and is a 7 out of 10.  He also noted epigastric abdominal pain and had an episode of vomiting today.  Denies diarrhea but notes about 3 bowel movements every morning.  Also notes worsening dry cough for the past 3 to 4 days.  Denies any fever.  Denies any sick contact.  Missed any dialysis days.  Patient has a planned defibrillator lead revision in 2 days due to ICD lead with worsening of R waves and increasing pacing threshold. He sees EP cardiologist Dr. Lovena Le  ED Course: He had mildly elevated temperature of 99.2, mildly hypertensive up to 150s/90s on room air.  No leukocytosis, anemia of 8.7 with a baseline of around 9.5.  Pulse of 103, creatinine of 8.48 which is stable.  Troponin of 41 and 39.  EEG showed no significant changes from prior. Chest x-ray showed no acute cardiopulmonary processes stable  cardiomegaly.  He was evaluated by cardiology in the ED and they are concerns about NSTEMI and will start heparin and nitro drip with planned LHC on Monday.   Review of Systems:  Constitutional: No Weight Change, No Fever ENT/Mouth: No sore throat, No Rhinorrhea Eyes: No Eye Pain, No Vision Changes Cardiovascular: + Chest Pain, + SOB Respiratory: + Cough, No Sputum, + Dyspnea  Gastrointestinal: No Nausea, + Vomiting, No Diarrhea, No Constipation, No Pain Genitourinary: no Urinary Incontinence, No Urgency, No Flank Pain Musculoskeletal: No Arthralgias, No Myalgias Skin: No Skin Lesions, No Pruritus, Neuro: no Weakness, No Numbness, Psych: No Anxiety/Panic, No Depression, no decrease appetite Heme/Lymph: No Bruising, No Bleeding  Past Medical History:  Diagnosis Date  . Anemia   . Atrial fibrillation   . Cardiomyopathy ([place]) 2010   Unclear Etiology: Last Echo 06/2009: EF 40-45%, severe Lateral & apical Hypokinesis (? CAD) ; Grade 2 DDysfxn. Mild conc LVH.   Marland Kitchen Cellulitis   . Chronic kidney disease (CKD), stage V ([place])    Dialysis Tue, Thurs, Sat  . Dyslipidemia   . Enterobacter sepsis ([place])   . Hypertension   . IDDM (insulin dependent diabetes mellitus) 11/03/2014  . Pneumonia   . S/P ICD (internal cardiac defibrillator) procedure 2010   VT Arrest (in [place])  . Schizophrenia ([place])   . 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 Wegener's granulomatosis ([place])     Past Surgical History:  Procedure Laterality Date  . AV FISTULA PLACEMENT Right 02/10/2013   Procedure: ARTERIOVENOUS (AV) FISTULA CREATION;  Surgeon: Arvilla Meres  Early, MD;  Location: [facility] OR;  Service: Vascular;  Laterality: Right;  Right forearm radial/cephalic arterovenous fistula.   Marland Kitchen CARDIAC CATHETERIZATION  2010   [place]: In setting of VT arrest. Per brother's report, nonobstructive with no intervention  . CARDIAC DEFIBRILLATOR PLACEMENT  2010   [place]  . FISTULOGRAM Right 08/09/2013   Procedure: FISTULOGRAM;   Surgeon: Angelia Mould, MD;  Location: [facility] C[facility] LAB;  Service: Cardiovascular;  Laterality: Right;  . ICD GENERATOR CHANGEOUT N/A 12/05/2017   Procedure: ICD GENERATOR CHANGEOUT;  Surgeon: Evans Lance, MD;  Location: [facility] CV LAB;  Service: Cardiovascular;  Laterality: N/A;  . LIGATION OF COMPETING BRANCHES OF ARTERIOVENOUS FISTULA Right 08/13/2013   Procedure: LIGATION OF COMPETING BRANCHES X5 OF ARTERIOVENOUS FISTULA- RIGHT ARM;  Surgeon: Serafina Mitchell, MD;  Location: [facility] OR;  Service: Vascular;  Laterality: Right;     reports that he quit smoking about 11 years ago. He has never used smokeless tobacco. He reports that he does not drink alcohol or use drugs.  No Known Allergies  Family History  Problem Relation Age of Onset  . CAD Mother      Prior to Admission medications   Medication Sig Start Date End Date Taking? Authorizing Provider  atorvastatin (LIPITOR) 20 MG tablet Take 1 tablet (20 mg total) by mouth daily. 12/31/19  Yes Evans Lance, MD  AURYXIA 1 GM 210 MG(Fe) tablet Take 420 mg by mouth 3 (three) times daily. 11/24/19  Yes [provider]  metoprolol succinate (TOPROL-XL) 50 MG 24 hr tablet Take 1 tablet (50 mg total) by mouth daily. 12/31/19  Yes Evans Lance, MD  calcium acetate (PHOSLO) 667 MG capsule Take 2 capsules (1,334 mg total) by mouth 3 (three) times daily with meals. 8a, 12p, 5p Patient not taking: Reported on 01/06/2020 11/09/18   Nickola Major, NP    Physical Exam: Vitals:   01/07/20 2230 01/07/20 2245 01/07/20 2300 01/07/20 2315  BP: (!) 148/87 (!) 158/87 (!) 150/89 (!) 157/88  Pulse: 93 94 94 95  Resp: 18 (!) 26 (!) 21 16  Temp:      TempSrc:      SpO2: 97% 97% 99% 99%  Weight:      Height:        Constitutional: NAD, calm, comfortable, well-appearing elderly male lying flat in bed Vitals:   01/07/20 2230 01/07/20 2245 01/07/20 2300 01/07/20 2315  BP: (!) 148/87 (!) 158/87 (!) 150/89 (!) 157/88  Pulse: 93 94  94 95  Resp: 18 (!) 26 (!) 21 16  Temp:      TempSrc:      SpO2: 97% 97% 99% 99%  Weight:      Height:       Eyes: PERRL, lids and conjunctivae normal, wearing glasses ENMT: Mucous membranes are moist. Posterior pharynx clear of any exudate or lesions.Normal dentition.  Neck: normal, supple Respiratory: clear to auscultation bilaterally, no wheezing, no crackles. Normal respiratory effort.  Persistent hacking dry cough. Cardiovascular: Regular rate and rhythm, no murmurs / rubs / gallops. No extremity edema. 2+ pedal pulses.  Pain with palpation of midsternal chest wall. Abdomen: Moderate epigastric tenderness, no masses palpated. Bowel sounds positive.  Musculoskeletal: no clubbing / cyanosis. No joint deformity upper and lower extremities. Good ROM, no contractures. Normal muscle tone. 2 small aneurysmal ballooning nodules AV fistula on left forearm with palpable thrill and no bruit.   Skin: no rashes, lesions, ulcers. No induration Neurologic: CN 2-12 grossly  intact. Sensation intact Strength 5/5 in all 4.  Psychiatric: Normal judgment and insight. Alert and oriented x 3. Normal mood.    Labs on Admission: I have personally reviewed following labs and imaging studies  CBC: Recent Labs  Lab 01/07/20 1953  WBC 6.2  HGB 8.7*  HCT 27.8*  MCV 93.9  PLT 376   Basic Metabolic Panel: Recent Labs  Lab 01/07/20 1953  NA 141  K 4.5  CL 99  CO2 27  GLUCOSE 103*  BUN 34*  CREATININE 8.48*  CALCIUM 8.7*   GFR: Estimated Creatinine Clearance: 10.1 mL/min (A) (by C-G formula based on SCr of 8.48 mg/dL (H)). Liver Function Tests: No results for input(s): AST, ALT, ALKPHOS, BILITOT, PROT, ALBUMIN in the last 168 hours. No results for input(s): LIPASE, AMYLASE in the last 168 hours. No results for input(s): AMMONIA in the last 168 hours. Coagulation Profile: Recent Labs  Lab 01/07/20 1953  INR 1.1   Cardiac Enzymes: No results for input(s): CKTOTAL, CKMB, CKMBINDEX,  TROPONINI in the last 168 hours. BNP (last 3 results) No results for input(s): PROBNP in the last 8760 hours. HbA1C: No results for input(s): HGBA1C in the last 72 hours. CBG: No results for input(s): GLUCAP in the last 168 hours. Lipid Profile: No results for input(s): CHOL, HDL, LDLCALC, TRIG, CHOLHDL, LDLDIRECT in the last 72 hours. Thyroid Function Tests: No results for input(s): TSH, T4TOTAL, FREET4, T3FREE, THYROIDAB in the last 72 hours. Anemia Panel: No results for input(s): VITAMINB12, FOLATE, FERRITIN, TIBC, IRON, RETICCTPCT in the last 72 hours. Urine analysis:    Component Value Date/Time   COLORURINE RED (A) 07/06/2016 1938   APPEARANCEUR TURBID (A) 07/06/2016 1938   LABSPEC 1.020 07/06/2016 1938   PHURINE 7.5 07/06/2016 1938   GLUCOSEU NEGATIVE 07/06/2016 1938   HGBUR LARGE (A) 07/06/2016 1938   BILIRUBINUR NEGATIVE 07/06/2016 1938   KETONESUR NEGATIVE 07/06/2016 1938   PROTEINUR >300 (A) 07/06/2016 1938   UROBILINOGEN 0.2 11/04/2014 0952   NITRITE NEGATIVE 07/06/2016 1938   LEUKOCYTESUR SMALL (A) 07/06/2016 1938    Radiological Exams on Admission: DG Chest 2 View  Result Date: 01/07/2020 CLINICAL DATA:  54 year old male with chest pain and shortness of breath. EXAM: CHEST - 2 VIEW COMPARISON:  Chest radiograph dated 11/10/2019. FINDINGS: There are bibasilar linear atelectasis/scarring. No focal consolidation, pleural effusion, or pneumothorax. Stable cardiomegaly. Left pectoral AICD device. No acute osseous pathology. IMPRESSION: 1. No acute cardiopulmonary process. 2. Stable cardiomegaly. Electronically Signed   By: Anner Crete M.D.   On: 01/07/2020 20:15    EKG: Independently reviewed.   Assessment/Plan  Chest pain evaluated by cardiology and has concerns of NSTEMI However he also has new persisent cough although no leukocytosis, fever or findings of pneumonia on CXR. Will obtain PCT and RVP to evaluate further. He also had significant epigastric  tenderness and had vomited this morning- will check Lipase Continue to trend troponin Start heparin and nitro gtt per cardiology  Check Lipid, HgA1C  Planned LHC Monday per cardiology  Hx of V.fib arrest s/p ICD Has planned defillation lead replacement on Monday- defer managment of this to cardiology  Atrial fibrillation  not on anticoagulation continue metoprolol   ESRD on HD Tues/Thurs/Sat  creatinine stable. Has not missed dialysis  consult nephrology in the morning for dialysis   Chronic systolic heart failure appears evolemic on exam  fluid management with dialysis    Anemia of chronic kidney continue iron supplement  Type 2 diabetes Controlled. Monitor for  now Check HgA1C  DVT prophylaxis: Heparin gtt Code Status: Full Family Communication: Plan discussed with patient at bedside  disposition Plan: Home with at least 2 midnight stays  Consults called:  Admission status: inpatient with at least 2 midnight stays given concerns of NSTEMI requiring close telemetry monitoring and progressive unit.   Orene Desanctis DO [facility]   If 7PM-7AM, please contact night-coverage [email or web address]   01/07/2020, 11:59 PM

## 2020-01-07 NOTE — ED Notes (Signed)
Velcro Kruplijanin Brother [phone] looking for an update

## 2020-01-07 NOTE — Anesthesia Preprocedure Evaluation (Addendum)
Anesthesia Evaluation  Patient identified by MRN, date of birth, ID band Patient awake    Reviewed: Allergy & Precautions, NPO status , Patient's Chart, lab work & pertinent test results  Airway Mallampati: II  TM Distance: >3 FB Neck ROM: Full    Dental no notable dental hx.    Pulmonary neg pulmonary ROS, former smoker,    Pulmonary exam normal breath sounds clear to auscultation       Cardiovascular hypertension, +CHF  Normal cardiovascular exam+ Cardiac Defibrillator  Rhythm:Regular Rate:Normal     Neuro/Psych Schizophrenia negative neurological ROS  negative psych ROS   GI/Hepatic negative GI ROS, Neg liver ROS,   Endo/Other  diabetes, Insulin Dependent  Renal/GU DialysisRenal disease  negative genitourinary   Musculoskeletal negative musculoskeletal ROS (+)   Abdominal   Peds negative pediatric ROS (+)  Hematology negative hematology ROS (+)   Anesthesia Other Findings   Reproductive/Obstetrics negative OB ROS                            Anesthesia Physical Anesthesia Plan  ASA: IV  Anesthesia Plan: General   Post-op Pain Management:    Induction: Intravenous  PONV Risk Score and Plan: 2 and Ondansetron, Dexamethasone and Treatment may vary due to age or medical condition  Airway Management Planned: Oral ETT  Additional Equipment: Arterial line and TEE  Intra-op Plan:   Post-operative Plan: Extubation in OR  Informed Consent: I have reviewed the patients History and Physical, chart, labs and discussed the procedure including the risks, benefits and alternatives for the proposed anesthesia with the patient or authorized representative who has indicated his/her understanding and acceptance.     Dental advisory given  Plan Discussed with: CRNA and Surgeon  Anesthesia Plan Comments: (PAT note written 01/07/2020 by Myra Gianotti, PA-C. TEE for monitoring purposes  only)       Anesthesia Quick Evaluation

## 2020-01-08 DIAGNOSIS — R079 Chest pain, unspecified: Secondary | ICD-10-CM | POA: Diagnosis not present

## 2020-01-08 DIAGNOSIS — I480 Paroxysmal atrial fibrillation: Secondary | ICD-10-CM | POA: Diagnosis present

## 2020-01-08 DIAGNOSIS — I5022 Chronic systolic (congestive) heart failure: Secondary | ICD-10-CM | POA: Diagnosis not present

## 2020-01-08 DIAGNOSIS — N2581 Secondary hyperparathyroidism of renal origin: Secondary | ICD-10-CM | POA: Diagnosis not present

## 2020-01-08 DIAGNOSIS — E785 Hyperlipidemia, unspecified: Secondary | ICD-10-CM | POA: Diagnosis present

## 2020-01-08 DIAGNOSIS — I4891 Unspecified atrial fibrillation: Secondary | ICD-10-CM | POA: Diagnosis not present

## 2020-01-08 DIAGNOSIS — M3130 Wegener's granulomatosis without renal involvement: Secondary | ICD-10-CM | POA: Diagnosis not present

## 2020-01-08 DIAGNOSIS — I428 Other cardiomyopathies: Secondary | ICD-10-CM | POA: Diagnosis not present

## 2020-01-08 DIAGNOSIS — I120 Hypertensive chronic kidney disease with stage 5 chronic kidney disease or end stage renal disease: Secondary | ICD-10-CM | POA: Diagnosis not present

## 2020-01-08 DIAGNOSIS — Z992 Dependence on renal dialysis: Secondary | ICD-10-CM | POA: Diagnosis not present

## 2020-01-08 DIAGNOSIS — E1122 Type 2 diabetes mellitus with diabetic chronic kidney disease: Secondary | ICD-10-CM | POA: Diagnosis present

## 2020-01-08 DIAGNOSIS — I071 Rheumatic tricuspid insufficiency: Secondary | ICD-10-CM | POA: Diagnosis present

## 2020-01-08 DIAGNOSIS — Z95810 Presence of automatic (implantable) cardiac defibrillator: Secondary | ICD-10-CM | POA: Diagnosis not present

## 2020-01-08 DIAGNOSIS — Z20822 Contact with and (suspected) exposure to covid-19: Secondary | ICD-10-CM | POA: Diagnosis not present

## 2020-01-08 DIAGNOSIS — Z8674 Personal history of sudden cardiac arrest: Secondary | ICD-10-CM | POA: Diagnosis not present

## 2020-01-08 DIAGNOSIS — D631 Anemia in chronic kidney disease: Secondary | ICD-10-CM | POA: Diagnosis present

## 2020-01-08 DIAGNOSIS — I214 Non-ST elevation (NSTEMI) myocardial infarction: Secondary | ICD-10-CM | POA: Diagnosis not present

## 2020-01-08 DIAGNOSIS — J9811 Atelectasis: Secondary | ICD-10-CM | POA: Diagnosis not present

## 2020-01-08 DIAGNOSIS — I2510 Atherosclerotic heart disease of native coronary artery without angina pectoris: Secondary | ICD-10-CM | POA: Diagnosis present

## 2020-01-08 DIAGNOSIS — Y712 Prosthetic and other implants, materials and accessory cardiovascular devices associated with adverse incidents: Secondary | ICD-10-CM | POA: Diagnosis present

## 2020-01-08 DIAGNOSIS — I5082 Biventricular heart failure: Secondary | ICD-10-CM | POA: Diagnosis present

## 2020-01-08 DIAGNOSIS — Z87891 Personal history of nicotine dependence: Secondary | ICD-10-CM | POA: Diagnosis not present

## 2020-01-08 DIAGNOSIS — I259 Chronic ischemic heart disease, unspecified: Secondary | ICD-10-CM | POA: Diagnosis not present

## 2020-01-08 DIAGNOSIS — N250 Renal osteodystrophy: Secondary | ICD-10-CM | POA: Diagnosis not present

## 2020-01-08 DIAGNOSIS — T82110A Breakdown (mechanical) of cardiac electrode, initial encounter: Secondary | ICD-10-CM | POA: Diagnosis not present

## 2020-01-08 DIAGNOSIS — I132 Hypertensive heart and chronic kidney disease with heart failure and with stage 5 chronic kidney disease, or end stage renal disease: Secondary | ICD-10-CM | POA: Diagnosis not present

## 2020-01-08 DIAGNOSIS — E8889 Other specified metabolic disorders: Secondary | ICD-10-CM | POA: Diagnosis present

## 2020-01-08 DIAGNOSIS — I4510 Unspecified right bundle-branch block: Secondary | ICD-10-CM | POA: Diagnosis present

## 2020-01-08 DIAGNOSIS — T82190A Other mechanical complication of cardiac electrode, initial encounter: Secondary | ICD-10-CM | POA: Diagnosis not present

## 2020-01-08 DIAGNOSIS — E1151 Type 2 diabetes mellitus with diabetic peripheral angiopathy without gangrene: Secondary | ICD-10-CM | POA: Diagnosis present

## 2020-01-08 DIAGNOSIS — E1129 Type 2 diabetes mellitus with other diabetic kidney complication: Secondary | ICD-10-CM | POA: Diagnosis not present

## 2020-01-08 DIAGNOSIS — N186 End stage renal disease: Secondary | ICD-10-CM | POA: Diagnosis not present

## 2020-01-08 DIAGNOSIS — F209 Schizophrenia, unspecified: Secondary | ICD-10-CM | POA: Diagnosis present

## 2020-01-08 DIAGNOSIS — T82598A Other mechanical complication of other cardiac and vascular devices and implants, initial encounter: Secondary | ICD-10-CM | POA: Diagnosis not present

## 2020-01-08 LAB — RESPIRATORY PANEL BY PCR

## 2020-01-08 LAB — LIPID PANEL
Cholesterol: 142 mg/dL (ref 0–200)
HDL: 25 mg/dL — ABNORMAL LOW (ref >40)
LDL Cholesterol: 101 mg/dL — ABNORMAL HIGH (ref 0–99)
Total CHOL/HDL Ratio: 5.7 RATIO
Triglycerides: 80 mg/dL (ref <150)
VLDL: 16 mg/dL (ref 0–40)

## 2020-01-08 LAB — HEMOGLOBIN A1C
Hgb A1c MFr Bld: 4.5 % — ABNORMAL LOW (ref 4.8–5.6)
Mean Plasma Glucose: 82.45 mg/dL

## 2020-01-08 LAB — BASIC METABOLIC PANEL
Anion gap: 16 — ABNORMAL HIGH (ref 5–15)
BUN: 46 mg/dL — ABNORMAL HIGH (ref 6–20)
CO2: 24 mmol/L (ref 22–32)
Calcium: 8.5 mg/dL — ABNORMAL LOW (ref 8.9–10.3)
Chloride: 101 mmol/L (ref 98–111)
Creatinine, Ser: 9.36 mg/dL — ABNORMAL HIGH (ref 0.61–1.24)
GFR calc Af Amer: 7 mL/min — ABNORMAL LOW (ref >60)
GFR calc non Af Amer: 6 mL/min — ABNORMAL LOW (ref >60)
Glucose, Bld: 102 mg/dL — ABNORMAL HIGH (ref 70–99)
Potassium: 4.3 mmol/L (ref 3.5–5.1)
Sodium: 141 mmol/L (ref 135–145)

## 2020-01-08 LAB — TSH: TSH: 1.912 u[IU]/mL (ref 0.350–4.500)

## 2020-01-08 LAB — HEPARIN LEVEL (UNFRACTIONATED)
Heparin Unfractionated: 0.12 IU/mL — ABNORMAL LOW (ref 0.30–0.70)
Heparin Unfractionated: 0.12 IU/mL — ABNORMAL LOW (ref 0.30–0.70)

## 2020-01-08 LAB — PROCALCITONIN: Procalcitonin: 0.45 ng/mL

## 2020-01-08 LAB — TROPONIN I (HIGH SENSITIVITY)
Troponin I (High Sensitivity): 35 ng/L — ABNORMAL HIGH (ref <18)
Troponin I (High Sensitivity): 44 ng/L — ABNORMAL HIGH (ref <18)

## 2020-01-08 LAB — CBC
HCT: 24.4 % — ABNORMAL LOW (ref 39.0–52.0)
Hemoglobin: 7.6 g/dL — ABNORMAL LOW (ref 13.0–17.0)
MCH: 29.6 pg (ref 26.0–34.0)
MCHC: 31.1 g/dL (ref 30.0–36.0)
MCV: 94.9 fL (ref 80.0–100.0)
Platelets: 141 10*3/uL — ABNORMAL LOW (ref 150–400)
RBC: 2.57 MIL/uL — ABNORMAL LOW (ref 4.22–5.81)
RDW: 13.4 % (ref 11.5–15.5)
WBC: 5.5 10*3/uL (ref 4.0–10.5)
nRBC: 0 % (ref 0.0–0.2)

## 2020-01-08 LAB — HIV ANTIBODY (ROUTINE TESTING W REFLEX): HIV Screen 4th Generation wRfx: NONREACTIVE

## 2020-01-08 LAB — LIPASE, BLOOD: Lipase: 35 U/L (ref 11–51)

## 2020-01-08 LAB — SARS CORONAVIRUS 2 (TAT 6-24 HRS): SARS Coronavirus 2: NEGATIVE

## 2020-01-08 MED ORDER — DOXERCALCIFEROL 4 MCG/2ML IV SOLN
4.0000 ug | INTRAVENOUS | Status: DC
  Administered 2020-01-08: 13:00:00 4 ug via INTRAVENOUS
  Filled 2020-01-08 (×2): qty 2

## 2020-01-08 MED ORDER — METOPROLOL SUCCINATE ER 50 MG PO TB24
50.0000 mg | ORAL_TABLET | Freq: Every day | ORAL | Status: DC
  Administered 2020-01-08 – 2020-01-11 (×4): 50 mg via ORAL
  Filled 2020-01-08 (×2): qty 1
  Filled 2020-01-08: qty 2
  Filled 2020-01-08: qty 1

## 2020-01-08 MED ORDER — ATORVASTATIN CALCIUM 10 MG PO TABS
20.0000 mg | ORAL_TABLET | Freq: Every day | ORAL | Status: DC
  Administered 2020-01-08: 20 mg via ORAL
  Filled 2020-01-08: qty 2

## 2020-01-08 MED ORDER — PROMETHAZINE HCL 25 MG/ML IJ SOLN
12.5000 mg | Freq: Four times a day (QID) | INTRAMUSCULAR | Status: DC | PRN
  Administered 2020-01-08: 09:00:00 12.5 mg via INTRAVENOUS
  Filled 2020-01-08: qty 1

## 2020-01-08 MED ORDER — ATORVASTATIN CALCIUM 40 MG PO TABS
40.0000 mg | ORAL_TABLET | Freq: Every day | ORAL | Status: DC
  Administered 2020-01-09 – 2020-01-11 (×2): 40 mg via ORAL
  Filled 2020-01-08 (×2): qty 1

## 2020-01-08 MED ORDER — CHLORHEXIDINE GLUCONATE CLOTH 2 % EX PADS
6.0000 | MEDICATED_PAD | Freq: Every day | CUTANEOUS | Status: DC
  Administered 2020-01-08 – 2020-01-10 (×2): 6 via TOPICAL

## 2020-01-08 MED ORDER — DARBEPOETIN ALFA 100 MCG/0.5ML IJ SOSY
100.0000 ug | PREFILLED_SYRINGE | INTRAMUSCULAR | Status: DC
  Administered 2020-01-08: 13:00:00 100 ug via INTRAVENOUS
  Filled 2020-01-08: qty 0.5

## 2020-01-08 MED ORDER — DOXERCALCIFEROL 4 MCG/2ML IV SOLN
INTRAVENOUS | Status: AC
  Filled 2020-01-08: qty 2

## 2020-01-08 MED ORDER — PANTOPRAZOLE SODIUM 40 MG IV SOLR
40.0000 mg | INTRAVENOUS | Status: DC
  Administered 2020-01-08 – 2020-01-11 (×3): 40 mg via INTRAVENOUS
  Filled 2020-01-08 (×3): qty 40

## 2020-01-08 MED ORDER — ONDANSETRON 4 MG PO TBDP
ORAL_TABLET | ORAL | Status: AC
  Filled 2020-01-08: qty 1

## 2020-01-08 MED ORDER — ONDANSETRON HCL 4 MG/2ML IJ SOLN
4.0000 mg | Freq: Four times a day (QID) | INTRAMUSCULAR | Status: DC | PRN
  Administered 2020-01-08 – 2020-01-10 (×2): 4 mg via INTRAVENOUS
  Filled 2020-01-08: qty 2

## 2020-01-08 MED ORDER — FERRIC CITRATE 1 GM 210 MG(FE) PO TABS
420.0000 mg | ORAL_TABLET | Freq: Three times a day (TID) | ORAL | Status: DC
  Administered 2020-01-08 – 2020-01-11 (×7): 420 mg via ORAL
  Filled 2020-01-08 (×9): qty 2

## 2020-01-08 MED ORDER — DARBEPOETIN ALFA 100 MCG/0.5ML IJ SOSY
PREFILLED_SYRINGE | INTRAMUSCULAR | Status: AC
  Filled 2020-01-08: qty 0.5

## 2020-01-08 MED ORDER — ACETAMINOPHEN 325 MG PO TABS
650.0000 mg | ORAL_TABLET | Freq: Four times a day (QID) | ORAL | Status: DC | PRN
  Administered 2020-01-08 – 2020-01-09 (×3): 650 mg via ORAL
  Filled 2020-01-08 (×3): qty 2

## 2020-01-08 MED ORDER — HEPARIN BOLUS VIA INFUSION
2400.0000 [IU] | Freq: Once | INTRAVENOUS | Status: AC
  Administered 2020-01-08: 13:00:00 2400 [IU] via INTRAVENOUS
  Filled 2020-01-08: qty 2400

## 2020-01-08 NOTE — Progress Notes (Signed)
Pt arrived from Dialysis. Vitals stable. Pt denies pain. Pt has non productive cough. CHG bath given. Tele applied/ccmd notified. Pt oriented to room and call bell within reach.  Jerald Kief, RN

## 2020-01-08 NOTE — Consult Note (Signed)
[facility] Renal Consultation Note    Indication for Consultation:  Management of ESRD/hemodialysis; anemia, hypertension/volume and secondary hyperparathyroidism PCP: Dr. Darnell Level. [name]  HPI: James Nicholson is a 54 y.o. male with ESRD in setting of biopsy proven P-ANCA positive necrotizing GN /Wegener's granulomatosis on hemodialysis T,Th,S at [facility]. PMH:  HTN, DMT2, Atrial Fibrillation, Cardiomyopathy, G2DD, V Fib arrest S/P ICD, enterobacter sepsis, GIB, schizophrenia, AOCD, SHPT. Speaks [place]. Last HD 01/06/20 Left 0.6 under EDW. Has been leaving under EDW last two treatments. EDW recently lowered.   Patient presented to ED 03/12/2021with 2 day history of L sided chest pain, SOB. Rated pain 7/10. Labs unremarkable except HGB 8.7.  Troponin 41 in setting of ESRD. EKG unchanged from previous EKG. CXR unremarkable. Seen in ED, still C/O chest pain 7/10 on NTG at 85 mcg/min but denies SOB. He is being admitted for chest pain/NSTEMI. Plans in place for [facility] 01/10/2020.   Past Medical History:  Diagnosis Date  . Anemia   . Atrial fibrillation   . Cardiomyopathy ([place]) 2010   Unclear Etiology: Last Echo 06/2009: EF 40-45%, severe Lateral & apical Hypokinesis (? CAD) ; Grade 2 DDysfxn. Mild conc LVH.   Marland Kitchen Cellulitis   . Chronic kidney disease (CKD), stage V ([place])    Dialysis Tue, Thurs, Sat  . Dyslipidemia   . Enterobacter sepsis ([place])   . Hypertension   . IDDM (insulin dependent diabetes mellitus) 11/03/2014  . Pneumonia   . S/P ICD (internal cardiac defibrillator) procedure 2010   VT Arrest (in [place])  . Schizophrenia ([place])   . Wegener's disease, pulmonary ([place]) 02/05/2013  . Wegener's granulomatosis ([place])    Past Surgical History:  Procedure Laterality Date  . AV FISTULA PLACEMENT Right 02/10/2013   Procedure: ARTERIOVENOUS (AV) FISTULA CREATION;  Surgeon: Rosetta Posner, MD;  Location: [facility] OR;  Service: Vascular;  Laterality: Right;  Right forearm  radial/cephalic arterovenous fistula.   Marland Kitchen CARDIAC CATHETERIZATION  2010   [place]: In setting of VT arrest. Per brother's report, nonobstructive with no intervention  . CARDIAC DEFIBRILLATOR PLACEMENT  2010   [place]  . FISTULOGRAM Right 08/09/2013   Procedure: FISTULOGRAM;  Surgeon: Angelia Mould, MD;  Location: [facility] CATH LAB;  Service: Cardiovascular;  Laterality: Right;  . ICD GENERATOR CHANGEOUT N/A 12/05/2017   Procedure: ICD GENERATOR CHANGEOUT;  Surgeon: Evans Lance, MD;  Location: [facility] CV LAB;  Service: Cardiovascular;  Laterality: N/A;  . LIGATION OF COMPETING BRANCHES OF ARTERIOVENOUS FISTULA Right 08/13/2013   Procedure: LIGATION OF COMPETING BRANCHES X5 OF ARTERIOVENOUS FISTULA- RIGHT ARM;  Surgeon: Serafina Mitchell, MD;  Location: [facility] OR;  Service: Vascular;  Laterality: Right;   Family History  Problem Relation Age of Onset  . CAD Mother    Social History:  reports that he quit smoking about 11 years ago. He has never used smokeless tobacco. He reports that he does not drink alcohol or use drugs. No Known Allergies Prior to Admission medications   Medication Sig Start Date End Date Taking? Authorizing Provider  atorvastatin (LIPITOR) 20 MG tablet Take 1 tablet (20 mg total) by mouth daily. 12/31/19  Yes Evans Lance, MD  AURYXIA 1 GM 210 MG(Fe) tablet Take 420 mg by mouth 3 (three) times daily. 11/24/19  Yes [provider]  metoprolol succinate (TOPROL-XL) 50 MG 24 hr tablet Take 1 tablet (50 mg total) by mouth daily. 12/31/19  Yes Evans Lance, MD  calcium acetate (PHOSLO) 667 MG capsule  Take 2 capsules (1,334 mg total) by mouth 3 (three) times daily with meals. 8a, 12p, 5p Patient not taking: Reported on 01/06/2020 11/09/18   Medina-Vargas, Senaida Lange, NP   Current Facility-Administered Medications  Medication Dose Route Frequency Provider Last Rate Last Admin  . acetaminophen (TYLENOL) tablet 650 mg  650 mg Oral Q6H PRN Shelly Coss, MD   650 mg at  01/08/20 0921  . atorvastatin (LIPITOR) tablet 20 mg  20 mg Oral Daily Tu, Ching T, DO   20 mg at 01/08/20 0958  . Chlorhexidine Gluconate Cloth 2 % PADS 6 each  6 each Topical Q0600 Valentina Gu, NP   6 each at 01/08/20 1000  . Darbepoetin Alfa (ARANESP) injection 100 mcg  100 mcg Intravenous Q Sat-HD Valentina Gu, NP      . doxercalciferol (HECTOROL) injection 4 mcg  4 mcg Intravenous Q T,Th,Sa-HD Valentina Gu, NP      . ferric citrate (AURYXIA) tablet 420 mg  420 mg Oral TID WC Tu, Ching T, DO   420 mg at 01/08/20 0807  . heparin ADULT infusion 100 units/mL (25000 units/278mL sodium chloride 0.45%)  1,000 Units/hr Intravenous Continuous Tu, Ching T, DO 10 mL/hr at 01/08/20 0806 1,000 Units/hr at 01/08/20 0806  . metoprolol succinate (TOPROL-XL) 24 hr tablet 50 mg  50 mg Oral Daily Tu, Ching T, DO   50 mg at 01/08/20 0958  . nitroGLYCERIN 50 mg in dextrose 5 % 250 mL (0.2 mg/mL) infusion  0-200 mcg/min Intravenous Continuous Tu, Ching T, DO 27 mL/hr at 01/08/20 0920 90 mcg/min at 01/08/20 0920  . ondansetron (ZOFRAN) injection 4 mg  4 mg Intravenous Q6H PRN Lovey Newcomer T, NP   4 mg at 01/08/20 0549  . promethazine (PHENERGAN) injection 12.5 mg  12.5 mg Intravenous Q6H PRN Shelly Coss, MD   12.5 mg at 01/08/20 0848  . sodium chloride flush (NS) 0.9 % injection 3 mL  3 mL Intravenous Once Tu, Ching T, DO       Current Outpatient Medications  Medication Sig Dispense Refill  . atorvastatin (LIPITOR) 20 MG tablet Take 1 tablet (20 mg total) by mouth daily. 90 tablet 3  . AURYXIA 1 GM 210 MG(Fe) tablet Take 420 mg by mouth 3 (three) times daily.    . metoprolol succinate (TOPROL-XL) 50 MG 24 hr tablet Take 1 tablet (50 mg total) by mouth daily. 90 tablet 3  . calcium acetate (PHOSLO) 667 MG capsule Take 2 capsules (1,334 mg total) by mouth 3 (three) times daily with meals. 8a, 12p, 5p (Patient not taking: Reported on 01/06/2020) 180 capsule 0   Labs: Basic Metabolic  Panel: Recent Labs  Lab 01/07/20 1953 01/08/20 0215  NA 141 141  K 4.5 4.3  CL 99 101  CO2 27 24  GLUCOSE 103* 102*  BUN 34* 46*  CREATININE 8.48* 9.36*  CALCIUM 8.7* 8.5*   Liver Function Tests: No results for input(s): AST, ALT, ALKPHOS, BILITOT, PROT, ALBUMIN in the last 168 hours. Recent Labs  Lab 01/08/20 0002  LIPASE 35   No results for input(s): AMMONIA in the last 168 hours. CBC: Recent Labs  Lab 01/07/20 1953 01/08/20 1048  WBC 6.2 5.5  HGB 8.7* 7.6*  HCT 27.8* 24.4*  MCV 93.9 94.9  PLT 175 141*   Cardiac Enzymes: No results for input(s): CKTOTAL, CKMB, CKMBINDEX, TROPONINI in the last 168 hours. CBG: No results for input(s): GLUCAP in the last 168 hours. Iron Studies: No results  for input(s): IRON, TIBC, TRANSFERRIN, FERRITIN in the last 72 hours. Studies/Results: DG Chest 2 View  Result Date: 01/07/2020 CLINICAL DATA:  54 year old male with chest pain and shortness of breath. EXAM: CHEST - 2 VIEW COMPARISON:  Chest radiograph dated 11/10/2019. FINDINGS: There are bibasilar linear atelectasis/scarring. No focal consolidation, pleural effusion, or pneumothorax. Stable cardiomegaly. Left pectoral AICD device. No acute osseous pathology. IMPRESSION: 1. No acute cardiopulmonary process. 2. Stable cardiomegaly. Electronically Signed   By: Anner Crete M.D.   On: 01/07/2020 20:15    ROS: As per HPI otherwise negative.   Physical Exam: Vitals:   01/08/20 1035 01/08/20 1100 01/08/20 1130 01/08/20 1200  BP: 127/66 130/67 117/61 123/64  Pulse: 77 72 69 76  Resp:      Temp:      TempSrc:      SpO2:      Weight:      Height:         General: Well developed, well nourished, in no acute distress. Head: Normocephalic, atraumatic, sclera non-icteric, mucus membranes are moist Neck: Supple. JVD not elevated. Lungs: Clear bilaterally to auscultation without wheezes, rales, or rhonchi. Breathing is unlabored. Heart: RRR with S1 S2, +S3. No M/R.  Abdomen:  Soft, non-tender, non-distended with normoactive bowel sounds. No rebound/guarding. No obvious abdominal masses. M-S:  Strength and tone appear normal for age. Lower extremities:without edema or ischemic changes, no open wounds  Neuro: Alert and oriented X 3. Moves all extremities spontaneously. Psych:  Responds to questions appropriately with a normal affect. Dialysis Access: LFA AVF positive bruit/Trill, hugely aneurysmal.   Dialysis Orders: [facility] T,Th,S 4.5 hrs 180NRe 400/800 77.5 kg 2.0K/2.0 Ca L AVF -Heparin 5000 units IV TIW -Hectorol 4 mcg IV TIW -Mircera 75 mcg IV q 4 weeks (last dose 12/14/2019)  Assessment/Plan: 1.  Chest pain: Per Primary/Cardiology. Plans for [facility] 01/10/2020. On NTG/Heparin gtt.  2.  ESRD -  T,Th,S. HD today on schedule. 2.0 K bath-hold heparin. On heparin gtt.  3.  Hypertension/volume  - BP controlled, no excess volume by exam. Has been leaving under OP EDW. No overt evidence of volume overload but try to lower EDW as tolerated in HD today. 4.  Anemia  - HGB 7.6. No recent ESA. Give Aranesp 100 mcg IV with HD today.  5.  Metabolic bone disease -  OP BMD labs at goal. Continue binders, VDRA,  6.  Nutrition - DC Heart Healthy diet. Renal/Carb mod diet with fld restrictions.  7.  DM-per primary 8.    Wegener's granulomatosis 9.     H/O Schizophrenia 10.   H/O VFib arrest S/P AICD followed by Dr. Lovena Le 11.   H/O AFib. SR on monitor. No anticoagulation. H/O GIB.   Burma Ketcher H. Owens Shark, NP-C 01/08/2020, 12:25 PM  [employer] [phone]

## 2020-01-08 NOTE — Progress Notes (Signed)
Discussed with Dr Delrae Sawyers Will try resting myoview

## 2020-01-08 NOTE — Progress Notes (Signed)
   01/08/20 2000  Vitals  Temp (!) 101.3 F (38.5 C)  Temp Source Oral  BP (!) 113/59  MAP (mmHg) 73  BP Location Left Arm  BP Method Automatic  Patient Position (if appropriate) Lying  Pulse Rate 99  Pulse Rate Source Monitor  ECG Heart Rate 98  Cardiac Rhythm ST  Resp 20   Temp 101.3, BC collected earlier today 650 mg tylenol given

## 2020-01-08 NOTE — Progress Notes (Signed)
ANTICOAGULATION CONSULT NOTE - Initial Consult  Pharmacy Consult for Heparin  Indication: chest pain/ACS  No Known Allergies  Patient Measurements: Height: 5\' 9"  (175.3 cm) Weight: 171 lb 1.2 oz (77.6 kg) IBW/kg (Calculated) : 70.7  Vital Signs: Temp: 98.9 F (37.2 C) (03/13 1025) Temp Source: Oral (03/13 1025) BP: 123/64 (03/13 1200) Pulse Rate: 76 (03/13 1200)  Labs: Recent Labs    01/07/20 1953 01/07/20 1953 01/07/20 2201 01/08/20 0002 01/08/20 0215 01/08/20 0753 01/08/20 1048  HGB 8.7*  --   --   --   --   --  7.6*  HCT 27.8*  --   --   --   --   --  24.4*  PLT 175  --   --   --   --   --  141*  LABPROT 13.9  --   --   --   --   --   --   INR 1.1  --   --   --   --   --   --   HEPARINUNFRC  --   --   --   --   --  0.12*  --   CREATININE 8.48*  --   --   --  9.36*  --   --   TROPONINIHS 41*   < > 39* 35* 44*  --   --    < > = values in this interval not displayed.   Estimated Creatinine Clearance: 9.1 mL/min (A) (by C-G formula based on SCr of 9.36 mg/dL (H)).  Medical History: Past Medical History:  Diagnosis Date  . Anemia   . Atrial fibrillation   . Cardiomyopathy ([place]) 2010   Unclear Etiology: Last Echo 06/2009: EF 40-45%, severe Lateral & apical Hypokinesis (? CAD) ; Grade 2 DDysfxn. Mild conc LVH.   Marland Kitchen Cellulitis   . Chronic kidney disease (CKD), stage V ([place])    Dialysis Tue, Thurs, Sat  . Dyslipidemia   . Enterobacter sepsis ([place])   . Hypertension   . IDDM (insulin dependent diabetes mellitus) 11/03/2014  . Pneumonia   . S/P ICD (internal cardiac defibrillator) procedure 2010   VT Arrest (in [place])  . Schizophrenia ([place])   . 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 Wegener's granulomatosis [facility])    Assessment: 54 y/o M with chest pain for heparin. Was scheduled for ICD lead revision on Monday. ESRD on HD. Hgb 8.7. PTA meds reviewed.   Goal of Therapy:  Heparin level 0.3-0.7 units/ml Monitor platelets by anticoagulation protocol: Yes   Plan:  - Heparin level subtherapeutic at 0.12  - Heparin bolus of 2400 units IV x 1 dose  - Increase heparin drip to 1250 units/hr - Heparin level in 8 hours  - Monitor patient for s/s of bleeding and cbc while on heparin    Duanne Limerick, PharmD, BCPS Clinical Pharmacist

## 2020-01-08 NOTE — Progress Notes (Signed)
ANTICOAGULATION CONSULT NOTE - Follow Up Consult  Pharmacy Consult for Heparin  Indication: chest pain/ACS  No Known Allergies  Patient Measurements: Height: 5\' 9"  (175.3 cm) Weight: 166 lb 14.2 oz (75.7 kg) IBW/kg (Calculated) : 70.7  Vital Signs: Temp: 101.3 F (38.5 C) (03/13 2000) Temp Source: Oral (03/13 2000) BP: 123/62 (03/13 2100) Pulse Rate: 104 (03/13 2100)  Labs: Recent Labs    01/07/20 1953 01/07/20 1953 01/07/20 2201 01/08/20 0002 01/08/20 0215 01/08/20 0753 01/08/20 1048 01/08/20 2023  HGB 8.7*  --   --   --   --   --  7.6*  --   HCT 27.8*  --   --   --   --   --  24.4*  --   PLT 175  --   --   --   --   --  141*  --   LABPROT 13.9  --   --   --   --   --   --   --   INR 1.1  --   --   --   --   --   --   --   HEPARINUNFRC  --   --   --   --   --  0.12*  --  0.12*  CREATININE 8.48*  --   --   --  9.36*  --   --   --   TROPONINIHS 41*   < > 39* 35* 44*  --   --   --    < > = values in this interval not displayed.   Estimated Creatinine Clearance: 9.1 mL/min (A) (by C-G formula based on SCr of 9.36 mg/dL (H)).  Medical History: Past Medical History:  Diagnosis Date  . Anemia   . Atrial fibrillation   . Cardiomyopathy ([place]) 2010   Unclear Etiology: Last Echo 06/2009: EF 40-45%, severe Lateral & apical Hypokinesis (? CAD) ; Grade 2 DDysfxn. Mild conc LVH.   Marland Kitchen Cellulitis   . Chronic kidney disease (CKD), stage V ([place])    Dialysis Tue, Thurs, Sat  . Dyslipidemia   . Enterobacter sepsis ([place])   . Hypertension   . IDDM (insulin dependent diabetes mellitus) 11/03/2014  . Pneumonia   . S/P ICD (internal cardiac defibrillator) procedure 2010   VT Arrest (in [place])  . Schizophrenia ([place])   . 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 Wegener's granulomatosis [facility])    Assessment: 54 y/o M with chest pain for heparin. Was scheduled for ICD lead revision on Monday. ESRD on HD. Hgb 8.7. PTA meds reviewed.  CP plan for myoview  Heparin drip 1250  uts/hr HL 0.12 remains < goal despite bolus and increased drip rate.    Goal of Therapy:  Heparin level 0.3-0.7 units/ml Monitor platelets by anticoagulation protocol: Yes   Plan:  Increase heparin drip 1450 uts/hr Daily HL, CBC - Monitor patient for s/s of bleeding and cbc while on heparin    Bonnita Nasuti Pharm.D. CPP, BCPS Clinical Pharmacist [phone] 01/08/2020 9:21 PM

## 2020-01-08 NOTE — ED Notes (Signed)
Called Microbiology in regards to pending respiratory panel PCR. Micro Tech to run specimen at this time.

## 2020-01-08 NOTE — Progress Notes (Signed)
Patient Name: James Nicholson   Pt profile 54 year old [place] gentleman with ? ischemic heart disease   end-stage renal disease on dialysis, history of Wegener's, history of VT arrest for which he is status post ICD implantation 2010 (he calls [place] bypass admitted with chest pain.  Chest pain syndrome is unusual.  In the preceding 2 weeks he has had night sweats and chills, a cough that is increasingly productive, followed by chest pain which is aggravated by eating moving and coughing.  He was seen in the outpatient clinic and noted to have diminishing R waves.  He is tentatively scheduled for ICD lead extraction and reimplantation.   SUBJECTIVE: Continues to have chest pain with minimal movement.  Cough. Past Medical History:  Diagnosis Date  . Anemia   . Atrial fibrillation   . Cardiomyopathy ([place]) 2010   Unclear Etiology: Last Echo 06/2009: EF 40-45%, severe Lateral & apical Hypokinesis (? CAD) ; Grade 2 DDysfxn. Mild conc LVH.   Marland Kitchen Cellulitis   . Chronic kidney disease (CKD), stage V ([place])    Dialysis Tue, Thurs, Sat  . Dyslipidemia   . Enterobacter sepsis ([place])   . Hypertension   . IDDM (insulin dependent diabetes mellitus) 11/03/2014  . Pneumonia   . S/P ICD (internal cardiac defibrillator) procedure 2010   VT Arrest (in [place])  . Schizophrenia ([place])   . 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 Wegener's granulomatosis ([place])     Scheduled Meds:  Scheduled Meds: . [START ON 01/09/2020] atorvastatin  40 mg Oral Daily  . Chlorhexidine Gluconate Cloth  6 each Topical Q0600  . darbepoetin (ARANESP) injection - DIALYSIS  100 mcg Intravenous Q Sat-HD  . doxercalciferol  4 mcg Intravenous Q T,Th,Sa-HD  . ferric citrate  420 mg Oral TID WC  . metoprolol succinate  50 mg Oral Daily  . pantoprazole (PROTONIX) IV  40 mg Intravenous Q24H  . sodium chloride flush  3 mL Intravenous Once   Continuous Infusions: . heparin 1,250 Units/hr (01/08/20 1254)  .  nitroGLYCERIN 90 mcg/min (01/08/20 1454)   acetaminophen, ondansetron (ZOFRAN) IV, promethazine    PHYSICAL EXAM Vitals:   01/08/20 1330 01/08/20 1400 01/08/20 1430 01/08/20 1528  BP: 107/61 118/65 (!) 114/58 95/84  Pulse: 75 65 65 82  Resp:    18  Temp:    98.9 F (37.2 C)  TempSrc:    Oral  SpO2:    100%  Weight:      Height:        Well developed and nourished in mild distress, coughing intermittently HENT normal Neck supple  Device pocket well healed; without hematoma or erythema.  There is no tethering  Clear Regular rate and rhythm, no murmurs or gallops Abd-soft with active BS No Clubbing cyanosis edema Skin-warm and dry A & Oriented  Grossly normal sensory and motor function  ECG  Sinus @ 78 [id number]  Telemetry Personally reviewed nsr      No intake or output data in the 24 hours ending 01/08/20 1601  LABS: Basic Metabolic Panel: Recent Labs  Lab 01/07/20 1953 01/08/20 0215  NA 141 141  K 4.5 4.3  CL 99 101  CO2 27 24  GLUCOSE 103* 102*  BUN 34* 46*  CREATININE 8.48* 9.36*  CALCIUM 8.7* 8.5*   Cardiac Enzymes: No results for input(s): CKTOTAL, CKMB, CKMBINDEX, TROPONINI in the last 72 hours. CBC: Recent Labs  Lab 01/07/20 1953 01/08/20 1048  WBC 6.2 5.5  HGB  8.7* 7.6*  HCT 27.8* 24.4*  MCV 93.9 94.9  PLT 175 141*   PROTIME: Recent Labs    01/07/20 1953  LABPROT 13.9  INR 1.1   Liver Function Tests: No results for input(s): AST, ALT, ALKPHOS, BILITOT, PROT, ALBUMIN in the last 72 hours. Recent Labs    01/08/20 0002  LIPASE 35   BNP: BNP (last 3 results) No results for input(s): BNP in the last 8760 hours.  ProBNP (last 3 results) No results for input(s): PROBNP in the last 8760 hours.  D-Dimer: No results for input(s): DDIMER in the last 72 hours. Hemoglobin A1C: Recent Labs    01/08/20 0335  HGBA1C 4.5*   Fasting Lipid Panel: Recent Labs    01/08/20 0335  CHOL 142  HDL 25*  LDLCALC 101*  TRIG 80  CHOLHDL  5.7   Thyroid Function Tests: Recent Labs    01/08/20 0335  TSH 1.912   Anemia Panel: No results for input(s): VITAMINB12, FOLATE, FERRITIN, TIBC, IRON, RETICCTPCT in the last 72 hours.    *   ASSESSMENT AND PLAN: Chest pain atypical  ICD  RV  Lead failure  ESRD on dialysis   Chills     His chest pain syndrome is unlikely cardiac.  Troponins are unrevealing.  I am also concerned re the systemic symptoms given the implanted ICD   Will obtain blood cultures and hopefully they will be back prior to decision to proceed with lead explantation.  Discussed with interpreter       Signed, Virl Axe MD  01/08/2020

## 2020-01-08 NOTE — ED Notes (Signed)
Pt family: brother Velcro updates r/t pt's admission per pt's request. Brother can be reached at [phone]

## 2020-01-08 NOTE — ED Notes (Signed)
Paged admitting to RN Mel Almond per her request

## 2020-01-08 NOTE — ED Notes (Signed)
Pt actively vomiting at this time. Dr Tawanna Solo contacted for additional orders. New orders received.

## 2020-01-08 NOTE — Progress Notes (Signed)
ANTICOAGULATION CONSULT NOTE - Initial Consult  Pharmacy Consult for Heparin  Indication: chest pain/ACS  No Known Allergies  Patient Measurements: Height: 5\' 9"  (175.3 cm) Weight: 176 lb 5.9 oz (80 kg) IBW/kg (Calculated) : 70.7  Vital Signs: Temp: 99.2 F (37.3 C) (03/12 1940) Temp Source: Oral (03/12 1940) BP: 162/92 (03/13 0000) Pulse Rate: 98 (03/13 0000)  Labs: Recent Labs    01/07/20 1953 01/07/20 2201  HGB 8.7*  --   HCT 27.8*  --   PLT 175  --   LABPROT 13.9  --   INR 1.1  --   CREATININE 8.48*  --   TROPONINIHS 41* 39*   Estimated Creatinine Clearance: 10.1 mL/min (A) (by C-G formula based on SCr of 8.48 mg/dL (H)).  Medical History: Past Medical History:  Diagnosis Date  . Anemia   . Atrial fibrillation   . Cardiomyopathy ([place]) 2010   Unclear Etiology: Last Echo 06/2009: EF 40-45%, severe Lateral & apical Hypokinesis (? CAD) ; Grade 2 DDysfxn. Mild conc LVH.   Marland Kitchen Cellulitis   . Chronic kidney disease (CKD), stage V ([place])    Dialysis Tue, Thurs, Sat  . Dyslipidemia   . Enterobacter sepsis ([place])   . Hypertension   . IDDM (insulin dependent diabetes mellitus) 11/03/2014  . Pneumonia   . S/P ICD (internal cardiac defibrillator) procedure 2010   VT Arrest (in [place])  . Schizophrenia ([place])   . 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 Wegener's granulomatosis [facility])    Assessment: 54 y/o M with chest pain for heparin. Was scheduled for ICD lead revision on Monday. ESRD on HD. Hgb 8.7. PTA meds reviewed.   Goal of Therapy:  Heparin level 0.3-0.7 units/ml Monitor platelets by anticoagulation protocol: Yes   Plan:  Heparin 4000 units BOLUS Start heparin drip at 1000 units/hr 0800 HL Daily CBC/HL Monitor for bleeding    Narda Bonds, PharmD, BCPS Clinical Pharmacist Phone: [phone]

## 2020-01-08 NOTE — Progress Notes (Signed)
PROGRESS NOTE    James Nicholson  OAC:[id number] DOB: 05-03-1966 DOA: 01/07/2020 PCP: [name], MD   Brief Narrative:  Patient is a 54 old [place] male with history of V. fib arrest status post ICD placement, chronic systolic heart failure, atrial fibrillation not on anticoagulation, Wegener's granulomatosis with ESRD on hemodialysis on TTS, peripheral vascular disease, type 2 diabetes mellitus, history of GI bleed, schizophrenia who presented with chest pain, shortness of breath, abdomen pain.  He speaks [place].  He reported 2 days history of left-sided chest pain associated with shortness of breath.  He follows with cardiology Dr. Lovena Le.  On presentation, he was found to have mild elevated troponin.  EKG did not show any significant change from prior.  Chest x-ray did not show any pneumonia.  Cardiology was consulted, there was concern for NSTEMI.  Started on heparin drip and nitrate drip  Plan for [facility] catheterization on Monday.  Assessment & Plan:   Principal Problem:   Chest pain Active Problems:   H/o Ventricular fibrillation Arrest now with AICD   Anemia in chronic kidney disease   ESRD on dialysis ([place])   Type II diabetes mellitus with renal manifestations (H[facility])   Chronic systolic CHF (congestive heart failure) (H[facility])   Chest pain: Cardiology following.  Plan for left heart catheterization.  Mild elevated troponin.  Started on heparin drip. LDL of 101.  Hemoglobin A1c of 4.5 Continue Lipitor, aspirin. Echocardiogram pending. Patient continues  to complain of chest pain this morning.  Abdomen, nausea, vomiting: He was vomiting this morning.  Continue antiemetics.  Will order PPI.  There is some mild tenderness on the epigastric region.  Lipase normal.  History of V. fib arrest: Status post ICD placement.  Follows with Dr. Lovena Le.He was planned for defillation lead replacement on Monday.Apparently the defibrillator is not functioning well and was beeping every four at  home as per brother.  Paroxysmal A. Fib:Not  On anticoagulation.  Currently rate is controlled and he is in NSR.  Continue metoprolol  ESRD on dialysis: Secondary to Wegener's granulomatosis.  Dialyzed on TTS schedule.  Nephrology following.  He has AV graft on the right forearm  Chronic systolic heart failure: Currently euvolemic.  Fluid management as per dialysis.echo pending  Normocytic anemia: Associated with CKD.  Continue to monitor CBC  Type 2 diabetes mellitus: Controlled.  Continue sliding scale insulin for now            DVT prophylaxis:IV heparin Code Status: Full Family Communication: discussed with  brother on phone on 01/08/2020 Disposition Plan: Patient is from home.  Anticipate discharge to home after full work-up   Consultants: Cardiology  Procedures: None  Antimicrobials:  Anti-infectives (From admission, onward)   Start     Dose/Rate Route Frequency Ordered Stop   01/08/20 0000  cefTRIAXone (ROCEPHIN) 1 g in sodium chloride 0.9 % 100 mL IVPB  Status:  Discontinued     1 g 200 mL/hr over 30 Minutes Intravenous Every 24 hours 01/07/20 2358 01/08/20 0005   01/08/20 0000  azithromycin (ZITHROMAX) 500 mg in sodium chloride 0.9 % 250 mL IVPB  Status:  Discontinued     500 mg 250 mL/hr over 60 Minutes Intravenous Every 24 hours 01/07/20 2358 01/08/20 0005      Subjective: Patient seen and examined at the bedside this morning.  Hemodynamically stable.  He is [place] speaking and does not understand English, very poor historian.  He says he was having chest pain.  Needs interpretor help  Objective:  Vitals:   01/08/20 0700 01/08/20 0745 01/08/20 0830 01/08/20 0900  BP: 127/83 136/82 122/78 123/88  Pulse: 89 94 87 99  Resp: (!) 29 17 (!) 27 (!) 32  Temp:      TempSrc:      SpO2: 94% 94% 92% 92%  Weight:      Height:       No intake or output data in the 24 hours ending 01/08/20 0921 Filed Weights   01/07/20 1937  Weight: 80 kg     Examination:  General exam: Not in distress,average built Respiratory system: Bilateral equal air entry, normal vesicular breath sounds, no wheezes or crackles  Cardiovascular system: S1 & S2 heard, RRR. No JVD, murmurs, rubs, gallops or clicks. No pedal edema. Gastrointestinal system: Abdomen is nondistended, soft and nontender. No organomegaly or masses felt. Normal bowel sounds heard. Central nervous system: Alert and oriented. No focal neurological deficits. Extremities: No edema, no clubbing ,no cyanosis, distal peripheral pulses palpable.  AV graft/fistula on the right forearm Skin: No rashes, lesions or ulcers,no icterus ,no pallor    Data Reviewed: I have personally reviewed following labs and imaging studies  CBC: Recent Labs  Lab 01/07/20 1953  WBC 6.2  HGB 8.7*  HCT 27.8*  MCV 93.9  PLT 416   Basic Metabolic Panel: Recent Labs  Lab 01/07/20 1953 01/08/20 0215  NA 141 141  K 4.5 4.3  CL 99 101  CO2 27 24  GLUCOSE 103* 102*  BUN 34* 46*  CREATININE 8.48* 9.36*  CALCIUM 8.7* 8.5*   GFR: Estimated Creatinine Clearance: 9.1 mL/min (A) (by C-G formula based on SCr of 9.36 mg/dL (H)). Liver Function Tests: No results for input(s): AST, ALT, ALKPHOS, BILITOT, PROT, ALBUMIN in the last 168 hours. Recent Labs  Lab 01/08/20 0002  LIPASE 35   No results for input(s): AMMONIA in the last 168 hours. Coagulation Profile: Recent Labs  Lab 01/07/20 1953  INR 1.1   Cardiac Enzymes: No results for input(s): CKTOTAL, CKMB, CKMBINDEX, TROPONINI in the last 168 hours. BNP (last 3 results) No results for input(s): PROBNP in the last 8760 hours. HbA1C: Recent Labs    01/08/20 0335  HGBA1C 4.5*   CBG: No results for input(s): GLUCAP in the last 168 hours. Lipid Profile: Recent Labs    01/08/20 0335  CHOL 142  HDL 25*  LDLCALC 101*  TRIG 80  CHOLHDL 5.7   Thyroid Function Tests: Recent Labs    01/08/20 0335  TSH 1.912   Anemia Panel: No  results for input(s): VITAMINB12, FOLATE, FERRITIN, TIBC, IRON, RETICCTPCT in the last 72 hours. Sepsis Labs: Recent Labs  Lab 01/08/20 0002  PROCALCITON 0.45    Recent Results (from the past 240 hour(s))  SARS CORONAVIRUS 2 (TAT 6-24 HRS) Nasopharyngeal Nasopharyngeal Swab     Status: None   Collection Time: 01/07/20  8:40 AM   Specimen: Nasopharyngeal Swab  Result Value Ref Range Status   SARS Coronavirus 2 NEGATIVE NEGATIVE Final    Comment: (NOTE) SARS-CoV-2 target nucleic acids are NOT DETECTED. The SARS-CoV-2 RNA is generally detectable in upper and lower respiratory specimens during the acute phase of infection. Negative results do not preclude SARS-CoV-2 infection, do not rule out co-infections with other pathogens, and should not be used as the sole basis for treatment or other patient management decisions. Negative results must be combined with clinical observations, patient history, and epidemiological information. The expected result is Negative. Fact Sheet for Patients: SugarRoll.be Fact Sheet for  Healthcare Providers: https://www.woods-mathews.com/ This test is not yet approved or cleared by the [place] and  has been authorized for detection and/or diagnosis of SARS-CoV-2 by FDA under an Emergency Use Authorization (EUA). This EUA will remain  in effect (meaning this test can be used) for the duration of the COVID-19 declaration under Section 56 4(b)(1) of the Act, 21 U.S.C. section 360bbb-3(b)(1), unless the authorization is terminated or revoked sooner. Performed at [facility], [place] [address], [place], [place] [address]   SARS CORONAVIRUS 2 (TAT 6-24 HRS) Nasopharyngeal Nasopharyngeal Swab     Status: None   Collection Time: 01/07/20 11:51 PM   Specimen: Nasopharyngeal Swab  Result Value Ref Range Status   SARS Coronavirus 2 NEGATIVE NEGATIVE Final    Comment: (NOTE) SARS-CoV-2 target nucleic acids  are NOT DETECTED. The SARS-CoV-2 RNA is generally detectable in upper and lower respiratory specimens during the acute phase of infection. Negative results do not preclude SARS-CoV-2 infection, do not rule out co-infections with other pathogens, and should not be used as the sole basis for treatment or other patient management decisions. Negative results must be combined with clinical observations, patient history, and epidemiological information. The expected result is Negative. Fact Sheet for Patients: SugarRoll.be Fact Sheet for Healthcare Providers: https://www.woods-mathews.com/ This test is not yet approved or cleared by the [place] FDA and  has been authorized for detection and/or diagnosis of SARS-CoV-2 by FDA under an Emergency Use Authorization (EUA). This EUA will remain  in effect (meaning this test can be used) for the duration of the COVID-19 declaration under Section 56 4(b)(1) of the Act, 21 U.S.C. section 360bbb-3(b)(1), unless the authorization is terminated or revoked sooner. Performed at [facility], [place] [address], [place], [place] [address]   Respiratory Panel by PCR     Status: None   Collection Time: 01/08/20 12:14 AM   Specimen: Nasopharyngeal Swab; Respiratory  Result Value Ref Range Status   Adenovirus NOT DETECTED NOT DETECTED Final   Coronavirus 229E NOT DETECTED NOT DETECTED Final    Comment: (NOTE) The Coronavirus on the Respiratory Panel, DOES NOT test for the novel  Coronavirus (2019 nCoV)    Coronavirus HKU1 NOT DETECTED NOT DETECTED Final   Coronavirus NL63 NOT DETECTED NOT DETECTED Final   Coronavirus OC43 NOT DETECTED NOT DETECTED Final   Metapneumovirus NOT DETECTED NOT DETECTED Final   Rhinovirus / Enterovirus NOT DETECTED NOT DETECTED Final   Influenza A NOT DETECTED NOT DETECTED Final   Influenza B NOT DETECTED NOT DETECTED Final   Parainfluenza Virus 1 NOT DETECTED NOT DETECTED Final    Parainfluenza Virus 2 NOT DETECTED NOT DETECTED Final   Parainfluenza Virus 3 NOT DETECTED NOT DETECTED Final   Parainfluenza Virus 4 NOT DETECTED NOT DETECTED Final   Respiratory Syncytial Virus NOT DETECTED NOT DETECTED Final   Bordetella pertussis NOT DETECTED NOT DETECTED Final   Chlamydophila pneumoniae NOT DETECTED NOT DETECTED Final   Mycoplasma pneumoniae NOT DETECTED NOT DETECTED Final    Comment: Performed at [facility], [place]. [address] [place], [place] [address]         Radiology Studies: DG Chest 2 View  Result Date: 01/07/2020 CLINICAL DATA:  54 year old male with chest pain and shortness of breath. EXAM: CHEST - 2 VIEW COMPARISON:  Chest radiograph dated 11/10/2019. FINDINGS: There are bibasilar linear atelectasis/scarring. No focal consolidation, pleural effusion, or pneumothorax. Stable cardiomegaly. Left pectoral AICD device. No acute osseous pathology. IMPRESSION: 1. No  acute cardiopulmonary process. 2. Stable cardiomegaly. Electronically Signed   By: Anner Crete M.D.   On: 01/07/2020 20:15        Scheduled Meds: . atorvastatin  20 mg Oral Daily  . Chlorhexidine Gluconate Cloth  6 each Topical Q0600  . darbepoetin (ARANESP) injection - DIALYSIS  100 mcg Intravenous Q Sat-HD  . doxercalciferol  4 mcg Intravenous Q T,Th,Sa-HD  . ferric citrate  420 mg Oral TID WC  . metoprolol succinate  50 mg Oral Daily  . sodium chloride flush  3 mL Intravenous Once   Continuous Infusions: . heparin 1,000 Units/hr (01/08/20 0806)  . nitroGLYCERIN 90 mcg/min (01/08/20 0920)     LOS: 0 days    Time spent:35 mins. More than 50% of that time was spent in counseling and/or coordination of care.      Shelly Coss, MD [facility] P3/13/2021, 9:21 AM

## 2020-01-08 NOTE — ED Notes (Signed)
Admitting paged to RN per her request 

## 2020-01-08 NOTE — ED Notes (Signed)
bfast ordered by James Nicholson 

## 2020-01-09 ENCOUNTER — Inpatient Hospital Stay (HOSPITAL_COMMUNITY): Payer: Medicare Other

## 2020-01-09 DIAGNOSIS — N186 End stage renal disease: Secondary | ICD-10-CM

## 2020-01-09 DIAGNOSIS — Z992 Dependence on renal dialysis: Secondary | ICD-10-CM

## 2020-01-09 DIAGNOSIS — I5022 Chronic systolic (congestive) heart failure: Secondary | ICD-10-CM

## 2020-01-09 DIAGNOSIS — R079 Chest pain, unspecified: Secondary | ICD-10-CM

## 2020-01-09 LAB — BASIC METABOLIC PANEL
Anion gap: 14 (ref 5–15)
BUN: 21 mg/dL — ABNORMAL HIGH (ref 6–20)
CO2: 28 mmol/L (ref 22–32)
Calcium: 7.9 mg/dL — ABNORMAL LOW (ref 8.9–10.3)
Chloride: 93 mmol/L — ABNORMAL LOW (ref 98–111)
Creatinine, Ser: 6.43 mg/dL — ABNORMAL HIGH (ref 0.61–1.24)
GFR calc Af Amer: 10 mL/min — ABNORMAL LOW (ref >60)
GFR calc non Af Amer: 9 mL/min — ABNORMAL LOW (ref >60)
Glucose, Bld: 95 mg/dL (ref 70–99)
Potassium: 3.8 mmol/L (ref 3.5–5.1)
Sodium: 135 mmol/L (ref 135–145)

## 2020-01-09 LAB — HEPARIN LEVEL (UNFRACTIONATED)
Heparin Unfractionated: 0.23 IU/mL — ABNORMAL LOW (ref 0.30–0.70)
Heparin Unfractionated: 0.3 IU/mL (ref 0.30–0.70)

## 2020-01-09 LAB — CBC WITH DIFFERENTIAL/PLATELET
Abs Immature Granulocytes: 0.02 10*3/uL (ref 0.00–0.07)
Basophils Absolute: 0 10*3/uL (ref 0.0–0.1)
Basophils Relative: 0 %
Eosinophils Absolute: 0.2 10*3/uL (ref 0.0–0.5)
Eosinophils Relative: 3 %
HCT: 25.2 % — ABNORMAL LOW (ref 39.0–52.0)
Hemoglobin: 8.1 g/dL — ABNORMAL LOW (ref 13.0–17.0)
Immature Granulocytes: 0 %
Lymphocytes Relative: 26 %
Lymphs Abs: 1.9 10*3/uL (ref 0.7–4.0)
MCH: 29.2 pg (ref 26.0–34.0)
MCHC: 32.1 g/dL (ref 30.0–36.0)
MCV: 91 fL (ref 80.0–100.0)
Monocytes Absolute: 0.9 10*3/uL (ref 0.1–1.0)
Monocytes Relative: 12 %
Neutro Abs: 4.2 10*3/uL (ref 1.7–7.7)
Neutrophils Relative %: 59 %
Platelets: 156 10*3/uL (ref 150–400)
RBC: 2.77 MIL/uL — ABNORMAL LOW (ref 4.22–5.81)
RDW: 13.2 % (ref 11.5–15.5)
WBC: 7.3 10*3/uL (ref 4.0–10.5)
nRBC: 0 % (ref 0.0–0.2)

## 2020-01-09 LAB — ECHOCARDIOGRAM COMPLETE
Height: 69 in
Weight: 2670.21 oz

## 2020-01-09 LAB — RENAL FUNCTION PANEL
Albumin: 2.8 g/dL — ABNORMAL LOW (ref 3.5–5.0)
Anion gap: 13 (ref 5–15)
BUN: 24 mg/dL — ABNORMAL HIGH (ref 6–20)
CO2: 26 mmol/L (ref 22–32)
Calcium: 8 mg/dL — ABNORMAL LOW (ref 8.9–10.3)
Chloride: 95 mmol/L — ABNORMAL LOW (ref 98–111)
Creatinine, Ser: 6.97 mg/dL — ABNORMAL HIGH (ref 0.61–1.24)
GFR calc Af Amer: 9 mL/min — ABNORMAL LOW (ref >60)
GFR calc non Af Amer: 8 mL/min — ABNORMAL LOW (ref >60)
Glucose, Bld: 97 mg/dL (ref 70–99)
Phosphorus: 4.6 mg/dL (ref 2.5–4.6)
Potassium: 3.7 mmol/L (ref 3.5–5.1)
Sodium: 134 mmol/L — ABNORMAL LOW (ref 135–145)

## 2020-01-09 MED ORDER — SODIUM CHLORIDE 0.9 % IV SOLN: 100.0000 mL | INTRAVENOUS | Status: DC | PRN

## 2020-01-09 MED ORDER — LIDOCAINE HCL (PF) 1 % IJ SOLN: 5.0000 mL | INTRAMUSCULAR | Status: DC | PRN

## 2020-01-09 MED ORDER — PENTAFLUOROPROP-TETRAFLUOROETH EX AERO: 1.0000 "application " | INHALATION_SPRAY | CUTANEOUS | Status: DC | PRN

## 2020-01-09 MED ORDER — LIDOCAINE-PRILOCAINE 2.5-2.5 % EX CREA
1.0000 "application " | TOPICAL_CREAM | CUTANEOUS | Status: DC | PRN
  Filled 2020-01-09: qty 5

## 2020-01-09 NOTE — Progress Notes (Signed)
ANTICOAGULATION CONSULT NOTE - Follow Up Consult  Pharmacy Consult for Heparin  Indication: chest pain/ACS  No Known Allergies  Patient Measurements: Height: 5\' 9"  (175.3 cm) Weight: 166 lb 14.2 oz (75.7 kg) IBW/kg (Calculated) : 70.7  Total body weight: 75.7 kg  Vital Signs: Temp: 98.1 F (36.7 C) (03/14 1500) Temp Source: Oral (03/14 1500) BP: 123/88 (03/14 1500) Pulse Rate: 97 (03/14 1500)  Labs: Recent Labs    01/07/20 1953 01/07/20 1953 01/07/20 2201 01/08/20 0002 01/08/20 0215 01/08/20 0753 01/08/20 1048 01/08/20 2023 01/09/20 0344 01/09/20 0816 01/09/20 1523  HGB 8.7*   < >  --   --   --   --  7.6*  --  8.1*  --   --   HCT 27.8*  --   --   --   --   --  24.4*  --  25.2*  --   --   PLT 175  --   --   --   --   --  141*  --  156  --   --   LABPROT 13.9  --   --   --   --   --   --   --   --   --   --   INR 1.1  --   --   --   --   --   --   --   --   --   --   HEPARINUNFRC  --   --   --   --   --    < >  --  0.12* 0.23*  --  0.30  CREATININE 8.48*   < >  --   --  9.36*  --   --   --  6.43* 6.97*  --   TROPONINIHS 41*   < > 39* 35* 44*  --   --   --   --   --   --    < > = values in this interval not displayed.   Estimated Creatinine Clearance: 12.3 mL/min (A) (by C-G formula based on SCr of 6.97 mg/dL (H)).  Medical History: Past Medical History:  Diagnosis Date  . Anemia   . Atrial fibrillation   . Cardiomyopathy ([place]) 2010   Unclear Etiology: Last Echo 06/2009: EF 40-45%, severe Lateral & apical Hypokinesis (? CAD) ; Grade 2 DDysfxn. Mild conc LVH.   Marland Kitchen Cellulitis   . Chronic kidney disease (CKD), stage V ([place])    Dialysis Tue, Thurs, Sat  . Dyslipidemia   . Enterobacter sepsis ([place])   . Hypertension   . IDDM (insulin dependent diabetes mellitus) 11/03/2014  . Pneumonia   . S/P ICD (internal cardiac defibrillator) procedure 2010   VT Arrest (in [place])  . Schizophrenia ([place])   . 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 Wegener's  granulomatosis [facility])    Assessment: 54 y/o M with chest pain started on  heparin. Was scheduled for ICD lead revision on Monday - still planned.  S/p myoview - f/u results and plan.  ESRD on HD. Hgb 8.7.  Heparin level 0.3 at goal on heparin drip 1600 uts/hr. No overt bleeding noted.   Goal of Therapy:  Heparin level 0.3-0.7 units/ml Monitor platelets by anticoagulation protocol: Yes   Plan:  Continue heparin drip to 1600 units/hr Daily HL, CBC Monitor for s/sx of bleeding  Bonnita Nasuti Pharm.D. CPP, BCPS Clinical Pharmacist [phone] 01/09/2020 6:23 PM    Please check [email or web address] for  unit-specific pharmacy phone numbers.

## 2020-01-09 NOTE — Progress Notes (Signed)
[facility] Progress Note   Subjective: No C/Os chest pain. Just stopped NTG gtt. Cards following. Previously planned ACID tomorrow lead extraction-EP following.     Objective Vitals:   01/09/20 0600 01/09/20 0730 01/09/20 0739 01/09/20 1000  BP: 103/65 116/76  125/73  Pulse: 82 84 81 88  Resp: 20 (!) 21 18 (!) 22  Temp:  98 F (36.7 C)    TempSrc:  Oral    SpO2: 97% 97% 95% 97%  Weight:      Height:       Physical Exam General: Pleasant WN,WD male in NAD Heart: S1, S2, + S3.  Lungs: CTAB Abdomen: Soft, NT Extremities: No LE edema. Dialysis Access: L AVF + T/B   Additional Objective Labs: Basic Metabolic Panel: Recent Labs  Lab 01/08/20 0215 01/09/20 0344 01/09/20 0816  NA 141 135 134*  K 4.3 3.8 3.7  CL 101 93* 95*  CO2 24 28 26   GLUCOSE 102* 95 97  BUN 46* 21* 24*  CREATININE 9.36* 6.43* 6.97*  CALCIUM 8.5* 7.9* 8.0*  PHOS  --   --  4.6   Liver Function Tests: Recent Labs  Lab 01/09/20 0816  ALBUMIN 2.8*   Recent Labs  Lab 01/08/20 0002  LIPASE 35   CBC: Recent Labs  Lab 01/07/20 1953 01/08/20 1048 01/09/20 0344  WBC 6.2 5.5 7.3  NEUTROABS  --   --  4.2  HGB 8.7* 7.6* 8.1*  HCT 27.8* 24.4* 25.2*  MCV 93.9 94.9 91.0  PLT 175 141* 156   Blood Culture    Component Value Date/Time   SDES EXPECTORATED SPUTUM 10/08/2018 2016   SDES EXPECTORATED SPUTUM 10/08/2018 2016   SPECREQUEST Normal 10/08/2018 2016   SPECREQUEST Normal Reflexed from [id number] 10/08/2018 2016   CULT  10/08/2018 2016    FEW Consistent with normal respiratory flora. Performed at [facility], [place] [address], [place], [place] [address]    REPTSTATUS 10/09/2018 FINAL 10/08/2018 2016   REPTSTATUS 10/11/2018 FINAL 10/08/2018 2016    Cardiac Enzymes: No results for input(s): CKTOTAL, CKMB, CKMBINDEX, TROPONINI in the last 168 hours. CBG: No results for input(s): GLUCAP in the last 168 hours. Iron Studies: No results for input(s): IRON, TIBC,  TRANSFERRIN, FERRITIN in the last 72 hours. @lablastinr3 @ Studies/Results: DG Chest 2 View  Result Date: 01/07/2020 CLINICAL DATA:  54 year old male with chest pain and shortness of breath. EXAM: CHEST - 2 VIEW COMPARISON:  Chest radiograph dated 11/10/2019. FINDINGS: There are bibasilar linear atelectasis/scarring. No focal consolidation, pleural effusion, or pneumothorax. Stable cardiomegaly. Left pectoral AICD device. No acute osseous pathology. IMPRESSION: 1. No acute cardiopulmonary process. 2. Stable cardiomegaly. Electronically Signed   By: Anner Crete M.D.   On: 01/07/2020 20:15   Medications: . sodium chloride    . sodium chloride    . heparin 1,600 Units/hr (01/09/20 0738)   . atorvastatin  40 mg Oral Daily  . Chlorhexidine Gluconate Cloth  6 each Topical Q0600  . darbepoetin (ARANESP) injection - DIALYSIS  100 mcg Intravenous Q Sat-HD  . doxercalciferol  4 mcg Intravenous Q T,Th,Sa-HD  . ferric citrate  420 mg Oral TID WC  . metoprolol succinate  50 mg Oral Daily  . pantoprazole (PROTONIX) IV  40 mg Intravenous Q24H  . sodium chloride flush  3 mL Intravenous Once     Dialysis Orders: HP T,Th,S 4.5 hrs 180NRe 400/800 77.5 kg 2.0K/2.0 Ca L AVF -Heparin 5000 units IV TIW -Hectorol 4 mcg IV TIW -Mircera  75 mcg IV q 4 weeks (last dose 12/14/2019)  Assessment/Plan: 1.  Chest pain: Per Primary/Cardiology. Echo done today. Plan myoview tomorrow.  2.  ESRD -  T,Th,S. Next HD 03/16.  On heparin gtt.  3.  Tentatively planned AICD lead extraction 01/10/2020 per EP. BC X 2 ordered prior to procedure.   4.  Hypertension/volume  - BP controlled, no excess volume by exam. Has been leaving under OP EDW. HD 01/08/20 77.6 Net UF  Net UF 1.5 Post wt 75.7 kg STANDING WT. Will need lower EDW on discharge. 5.  Anemia  - HGB 7.6 on adm, 8.1 01/09/2020. No recent ESA. Given Aranesp 100 mcg IV with HD 54/36/0677.  6.  Metabolic bone disease - BMD labs at goal. Continue binders, VDRA,  7.   Nutrition - DC Heart Healthy diet. Renal/Carb mod diet with fld restrictions.  8.  DM-per primary 8.    Wegener's granulomatosis 9.     H/O Schizophrenia 10.   H/O VFib arrest S/P AICD followed by Dr. Lovena Le 11.   H/O AFib. SR on monitor. No anticoagulation. Currently on heparin gtt. H/O GIB. Per primary  Jimmye Norman. Nikhita Mentzel NP-C 01/09/2020, 11:44 AM  [employer] [phone]

## 2020-01-09 NOTE — Progress Notes (Signed)
  Echocardiogram 2D Echocardiogram has been performed.  James Nicholson 01/09/2020, 9:56 AM

## 2020-01-09 NOTE — Progress Notes (Signed)
ANTICOAGULATION CONSULT NOTE - Follow Up Consult  Pharmacy Consult for Heparin  Indication: chest pain/ACS  No Known Allergies  Patient Measurements: Height: 5\' 9"  (175.3 cm) Weight: 166 lb 14.2 oz (75.7 kg) IBW/kg (Calculated) : 70.7  Total body weight: 75.7 kg  Vital Signs: Temp: 98.1 F (36.7 C) (03/14 0438) Temp Source: Oral (03/14 0438) BP: 103/65 (03/14 0600) Pulse Rate: 82 (03/14 0600)  Labs: Recent Labs    01/07/20 1953 01/07/20 1953 01/07/20 2201 01/08/20 0002 01/08/20 0215 01/08/20 0753 01/08/20 1048 01/08/20 2023 01/09/20 0344  HGB 8.7*   < >  --   --   --   --  7.6*  --  8.1*  HCT 27.8*  --   --   --   --   --  24.4*  --  25.2*  PLT 175  --   --   --   --   --  141*  --  156  LABPROT 13.9  --   --   --   --   --   --   --   --   INR 1.1  --   --   --   --   --   --   --   --   HEPARINUNFRC  --   --   --   --   --  0.12*  --  0.12* 0.23*  CREATININE 8.48*  --   --   --  9.36*  --   --   --  6.43*  TROPONINIHS 41*   < > 39* 35* 44*  --   --   --   --    < > = values in this interval not displayed.   Estimated Creatinine Clearance: 13.3 mL/min (A) (by C-G formula based on SCr of 6.43 mg/dL (H)).  Medical History: Past Medical History:  Diagnosis Date  . Anemia   . Atrial fibrillation   . Cardiomyopathy ([place]) 2010   Unclear Etiology: Last Echo 06/2009: EF 40-45%, severe Lateral & apical Hypokinesis (? CAD) ; Grade 2 DDysfxn. Mild conc LVH.   Marland Kitchen Cellulitis   . Chronic kidney disease (CKD), stage V ([place])    Dialysis Tue, Thurs, Sat  . Dyslipidemia   . Enterobacter sepsis ([place])   . Hypertension   . IDDM (insulin dependent diabetes mellitus) 11/03/2014  . Pneumonia   . S/P ICD (internal cardiac defibrillator) procedure 2010   VT Arrest (in [place])  . Schizophrenia ([place])   . 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 Wegener's granulomatosis [facility])    Assessment: 54 y/o M with chest pain for heparin. Was scheduled for ICD lead revision on Monday.  ESRD on HD. Hgb 8.7. PTA meds reviewed.  CP plan for myoview   Heparin level remains subtherapeutic despite increasing drip rate 1450 units/hr. CBC low but stable. No overt bleeding noted.   Goal of Therapy:  Heparin level 0.3-0.7 units/ml Monitor platelets by anticoagulation protocol: Yes   Plan:  Increase heparin drip to 1600 units/hr Check 8-hr heparin level Daily HL, CBC Monitor for s/sx of bleeding  Richardine Service, PharmD PGY1 Pharmacy Resident Phone: [phone] 01/09/2020  7:09 AM  Please check [email or web address] for unit-specific pharmacy phone numbers.

## 2020-01-09 NOTE — Progress Notes (Signed)
DAILY PROGRESS NOTE   Patient Name: James Nicholson Date of Encounter: 01/09/2020 Cardiologist: No primary care provider on file.  Chief Complaint   " A little chest pain"  Patient Profile   James Nicholson is a 54 y.o. male with a hx of A. Fib, VT arrest s/p ICD, schizophrenia, Wegners granulomatosis, HLD, HTN and ESRD who is being seen today for the evaluation of chest pain at the request of the ER.  Subjective   Described "a little chest pain" - for myoview stress test. Echo also performed today. Scheduled ICD lead extraction tomorrow -blood cultures sent.  Objective   Vitals:   01/09/20 0600 01/09/20 0730 01/09/20 0739 01/09/20 1000  BP: 103/65 116/76  125/73  Pulse: 82 84 81 88  Resp: 20 (!) 21 18 (!) 22  Temp:  98 F (36.7 C)    TempSrc:  Oral    SpO2: 97% 97% 95% 97%  Weight:      Height:        Intake/Output Summary (Last 24 hours) at 01/09/2020 1144 Last data filed at 01/09/2020 0300 Gross per 24 hour  Intake 1028.83 ml  Output 1500 ml  Net -471.17 ml   Filed Weights   01/07/20 1937 01/08/20 1025 01/08/20 1437  Weight: 80 kg 77.6 kg 75.7 kg    Physical Exam   General appearance: alert and no distress Neck: no carotid bruit, no JVD and thyroid not enlarged, symmetric, no tenderness/mass/nodules Lungs: clear to auscultation bilaterally Heart: regular rate and rhythm Abdomen: soft, non-tender; bowel sounds normal; no masses,  no organomegaly Extremities: right arm fistula, +thrill Pulses: 2+ and symmetric Skin: Skin color, texture, turgor normal. No rashes or lesions Neurologic: Grossly normal Psych: Pleasant  Inpatient Medications    Scheduled Meds: . atorvastatin  40 mg Oral Daily  . Chlorhexidine Gluconate Cloth  6 each Topical Q0600  . darbepoetin (ARANESP) injection - DIALYSIS  100 mcg Intravenous Q Sat-HD  . doxercalciferol  4 mcg Intravenous Q T,Th,Sa-HD  . ferric citrate  420 mg Oral TID WC  . metoprolol succinate  50 mg Oral Daily    . pantoprazole (PROTONIX) IV  40 mg Intravenous Q24H  . sodium chloride flush  3 mL Intravenous Once    Continuous Infusions: . sodium chloride    . sodium chloride    . heparin 1,600 Units/hr (01/09/20 0738)    PRN Meds: sodium chloride, sodium chloride, acetaminophen, lidocaine (PF), lidocaine-prilocaine, ondansetron (ZOFRAN) IV, pentafluoroprop-tetrafluoroeth, promethazine   Labs   Results for orders placed or performed during the hospital encounter of 01/07/20 (from the past 48 hour(s))  Basic metabolic panel     Status: Abnormal   Collection Time: 01/07/20  7:53 PM  Result Value Ref Range   Sodium 141 135 - 145 mmol/L   Potassium 4.5 3.5 - 5.1 mmol/L   Chloride 99 98 - 111 mmol/L   CO2 27 22 - 32 mmol/L   Glucose, Bld 103 (H) 70 - 99 mg/dL    Comment: Glucose reference range applies only to samples taken after fasting for at least 8 hours.   BUN 34 (H) 6 - 20 mg/dL   Creatinine, Ser 8.48 (H) 0.61 - 1.24 mg/dL   Calcium 8.7 (L) 8.9 - 10.3 mg/dL   GFR calc non Af Amer 6 (L) >60 mL/min   GFR calc Af Amer 7 (L) >60 mL/min   Anion gap 15 5 - 15    Comment: Performed at [facility]  [address], [place], [place] [address]  CBC     Status: Abnormal   Collection Time: 01/07/20  7:53 PM  Result Value Ref Range   WBC 6.2 4.0 - 10.5 K/uL   RBC 2.96 (L) 4.22 - 5.81 MIL/uL   Hemoglobin 8.7 (L) 13.0 - 17.0 g/dL   HCT 27.8 (L) 39.0 - 52.0 %   MCV 93.9 80.0 - 100.0 fL   MCH 29.4 26.0 - 34.0 pg   MCHC 31.3 30.0 - 36.0 g/dL   RDW 13.4 11.5 - 15.5 %   Platelets 175 150 - 400 K/uL   nRBC 0.0 0.0 - 0.2 %    Comment: Performed at [facility], [place] [address], [place], [place] [address]  Troponin I (High Sensitivity)     Status: Abnormal   Collection Time: 01/07/20  7:53 PM  Result Value Ref Range   Troponin I (High Sensitivity) 41 (H) <18 ng/L    Comment: (NOTE) Elevated high sensitivity troponin I (hsTnI) values and significant  changes across serial measurements  may suggest ACS but many other  chronic and acute conditions are known to elevate hsTnI results.  Refer to the "Links" section for chest pain algorithms and additional  guidance. Performed at [facility], [place] [address], [place], [place] [address]   Protime-INR (order if Patient is taking Coumadin / Warfarin)     Status: None   Collection Time: 01/07/20  7:53 PM  Result Value Ref Range   Prothrombin Time 13.9 11.4 - 15.2 seconds   INR 1.1 0.8 - 1.2    Comment: (NOTE) INR goal varies based on device and disease states. Performed at [facility], [place] [address], [place], [place] [address]   Troponin I (High Sensitivity)     Status: Abnormal   Collection Time: 01/07/20 10:01 PM  Result Value Ref Range   Troponin I (High Sensitivity) 39 (H) <18 ng/L    Comment: (NOTE) Elevated high sensitivity troponin I (hsTnI) values and significant  changes across serial measurements may suggest ACS but many other  chronic and acute conditions are known to elevate hsTnI results.  Refer to the "Links" section for chest pain algorithms and additional  guidance. Performed at [facility], [place] [address] [place], [place] [address]   SARS CORONAVIRUS 2 (TAT 6-24 HRS) Nasopharyngeal Nasopharyngeal Swab     Status: None   Collection Time: 01/07/20 11:51 PM   Specimen: Nasopharyngeal Swab  Result Value Ref Range   SARS Coronavirus 2 NEGATIVE NEGATIVE    Comment: (NOTE) SARS-CoV-2 target nucleic acids are NOT DETECTED. The SARS-CoV-2 RNA is generally detectable in upper and lower respiratory specimens during the acute phase of infection. Negative results do not preclude SARS-CoV-2 infection, do not rule out co-infections with other pathogens, and should not be used as the sole basis for treatment or other patient management decisions. Negative results must be combined with clinical observations, patient history, and epidemiological information. The expected result is  Negative. Fact Sheet for Patients: SugarRoll.be Fact Sheet for Healthcare Providers: https://www.woods-mathews.com/ This test is not yet approved or cleared by the [place] FDA and  has been authorized for detection and/or diagnosis of SARS-CoV-2 by FDA under an Emergency Use Authorization (EUA). This EUA will remain  in effect (meaning this test can be used) for the duration of the COVID-19 declaration under Section 56 4(b)(1) of the Act, 21 U.S.C. section 360bbb-3(b)(1), unless the authorization is terminated or revoked sooner. Performed at [facility], [place] [address], [place], [place] [address]   HIV Antibody (routine testing w rflx)     Status: None   Collection Time: 01/08/20 12:02 AM  Result Value Ref Range   HIV Screen 4th Generation wRfx NON REACTIVE NON REACTIVE    Comment: Performed at [facility] [address], [place], [place] [address]  Lipase, blood     Status: None   Collection Time: 01/08/20 12:02 AM  Result Value Ref Range   Lipase 35 11 - 51 U/L    Comment: Performed at [facility] [address], [place], [place] [address]  Procalcitonin - Baseline     Status: None   Collection Time: 01/08/20 12:02 AM  Result Value Ref Range   Procalcitonin 0.45 ng/mL    Comment:        Interpretation: PCT (Procalcitonin) <= 0.5 ng/mL: Systemic infection (sepsis) is not likely. Local bacterial infection is possible. (NOTE)       Sepsis PCT Algorithm           Lower Respiratory Tract                                      Infection PCT Algorithm    ----------------------------     ----------------------------         PCT < 0.25 ng/mL                PCT < 0.10 ng/mL         Strongly encourage             Strongly discourage   discontinuation of antibiotics    initiation of antibiotics    ----------------------------     -----------------------------       PCT 0.25 - 0.50 ng/mL            PCT 0.10 - 0.25  ng/mL               OR       >80% decrease in PCT            Discourage initiation of                                            antibiotics      Encourage discontinuation           of antibiotics    ----------------------------     -----------------------------         PCT >= 0.50 ng/mL              PCT 0.26 - 0.50 ng/mL               AND        <80% decrease in PCT             Encourage initiation of                                             antibiotics       Encourage continuation           of antibiotics    ----------------------------     -----------------------------  PCT >= 0.50 ng/mL                  PCT > 0.50 ng/mL               AND         increase in PCT                  Strongly encourage                                      initiation of antibiotics    Strongly encourage escalation           of antibiotics                                     -----------------------------                                           PCT <= 0.25 ng/mL                                                 OR                                        > 80% decrease in PCT                                     Discontinue / Do not initiate                                             antibiotics Performed at [facility], [address] [place], [place] [address]   Troponin I (High Sensitivity)     Status: Abnormal   Collection Time: 01/08/20 12:02 AM  Result Value Ref Range   Troponin I (High Sensitivity) 35 (H) <18 ng/L    Comment: (NOTE) Elevated high sensitivity troponin I (hsTnI) values and significant  changes across serial measurements may suggest ACS but many other  chronic and acute conditions are known to elevate hsTnI results.  Refer to the "Links" section for chest pain algorithms and additional  guidance. Performed at [facility], [place] [address], [place],  [address]   Respiratory Panel by PCR     Status: None   Collection Time: 01/08/20 12:14 AM    Specimen: Nasopharyngeal Swab; Respiratory  Result Value Ref Range   Adenovirus NOT DETECTED NOT DETECTED   Coronavirus 229E NOT DETECTED NOT DETECTED    Comment: (NOTE) The Coronavirus on the Respiratory Panel, DOES NOT test for the novel  Coronavirus (2019 nCoV)    Coronavirus HKU1 NOT DETECTED NOT DETECTED   Coronavirus NL63 NOT DETECTED NOT DETECTED   Coronavirus OC43 NOT DETECTED NOT DETECTED   Metapneumovirus NOT DETECTED NOT DETECTED  Rhinovirus / Enterovirus NOT DETECTED NOT DETECTED   Influenza A NOT DETECTED NOT DETECTED   Influenza B NOT DETECTED NOT DETECTED   Parainfluenza Virus 1 NOT DETECTED NOT DETECTED   Parainfluenza Virus 2 NOT DETECTED NOT DETECTED   Parainfluenza Virus 3 NOT DETECTED NOT DETECTED   Parainfluenza Virus 4 NOT DETECTED NOT DETECTED   Respiratory Syncytial Virus NOT DETECTED NOT DETECTED   Bordetella pertussis NOT DETECTED NOT DETECTED   Chlamydophila pneumoniae NOT DETECTED NOT DETECTED   Mycoplasma pneumoniae NOT DETECTED NOT DETECTED    Comment: Performed at [facility], [place] [address] [place], [place] [address]  Basic metabolic panel     Status: Abnormal   Collection Time: 01/08/20  2:15 AM  Result Value Ref Range   Sodium 141 135 - 145 mmol/L   Potassium 4.3 3.5 - 5.1 mmol/L   Chloride 101 98 - 111 mmol/L   CO2 24 22 - 32 mmol/L   Glucose, Bld 102 (H) 70 - 99 mg/dL    Comment: Glucose reference range applies only to samples taken after fasting for at least 8 hours.   BUN 46 (H) 6 - 20 mg/dL   Creatinine, Ser 9.36 (H) 0.61 - 1.24 mg/dL   Calcium 8.5 (L) 8.9 - 10.3 mg/dL   GFR calc non Af Amer 6 (L) >60 mL/min   GFR calc Af Amer 7 (L) >60 mL/min   Anion gap 16 (H) 5 - 15    Comment: Performed at [facility] [address], [place], [place] [address]  Troponin I (High Sensitivity)     Status: Abnormal   Collection Time: 01/08/20  2:15 AM  Result Value Ref Range   Troponin I (High Sensitivity) 44 (H) <18 ng/L    Comment:  (NOTE) Elevated high sensitivity troponin I (hsTnI) values and significant  changes across serial measurements may suggest ACS but many other  chronic and acute conditions are known to elevate hsTnI results.  Refer to the "Links" section for chest pain algorithms and additional  guidance. Performed at [facility], [place] [address] [place], [place] [address]   TSH     Status: None   Collection Time: 01/08/20  3:35 AM  Result Value Ref Range   TSH 1.912 0.350 - 4.500 uIU/mL    Comment: Performed by a 3rd Generation assay with a functional sensitivity of <=0.01 uIU/mL. Performed at [facility], [place] [address] [place], [place] [address]   Lipid panel     Status: Abnormal   Collection Time: 01/08/20  3:35 AM  Result Value Ref Range   Cholesterol 142 0 - 200 mg/dL   Triglycerides 80 <150 mg/dL   HDL 25 (L) >40 mg/dL   Total CHOL/HDL Ratio 5.7 RATIO   VLDL 16 0 - 40 mg/dL   LDL Cholesterol 101 (H) 0 - 99 mg/dL    Comment:        Total Cholesterol/HDL:CHD Risk Coronary Heart Disease Risk Table                     Men   Women  1/2 Average Risk   3.4   3.3  Average Risk       5.0   4.4  2 X Average Risk   9.6   7.1  3 X Average Risk  23.4   11.0        Use the calculated Patient Ratio above and the CHD Risk Table  to determine the patient's CHD Risk.        ATP III CLASSIFICATION (LDL):  <100     mg/dL   Optimal  100-129  mg/dL   Near or Above                    Optimal  130-159  mg/dL   Borderline  160-189  mg/dL   High  >190     mg/dL   Very High Performed at [facility] [address] [place], [place] [address]   Hemoglobin A1c     Status: Abnormal   Collection Time: 01/08/20  3:35 AM  Result Value Ref Range   Hgb A1c MFr Bld 4.5 (L) 4.8 - 5.6 %    Comment: (NOTE) Pre diabetes:          5.7%-6.4% Diabetes:              >6.4% Glycemic control for   <7.0% adults with diabetes    Mean Plasma Glucose 82.45 mg/dL    Comment: Performed at [facility] [address], [place], [place] [address]  Heparin level (unfractionated)     Status: Abnormal   Collection Time: 01/08/20  7:53 AM  Result Value Ref Range   Heparin Unfractionated 0.12 (L) 0.30 - 0.70 IU/mL    Comment: (NOTE) If heparin results are below expected values, and patient dosage has  been confirmed, suggest follow up testing of antithrombin III levels. Performed at [facility], [place] [address], [place], [place] [address]   CBC     Status: Abnormal   Collection Time: 01/08/20 10:48 AM  Result Value Ref Range   WBC 5.5 4.0 - 10.5 K/uL   RBC 2.57 (L) 4.22 - 5.81 MIL/uL   Hemoglobin 7.6 (L) 13.0 - 17.0 g/dL   HCT 24.4 (L) 39.0 - 52.0 %   MCV 94.9 80.0 - 100.0 fL   MCH 29.6 26.0 - 34.0 pg   MCHC 31.1 30.0 - 36.0 g/dL   RDW 13.4 11.5 - 15.5 %   Platelets 141 (L) 150 - 400 K/uL   nRBC 0.0 0.0 - 0.2 %    Comment: Performed at [facility], [place] [address], [place], [place] [address]  Heparin level (unfractionated)     Status: Abnormal   Collection Time: 01/08/20  8:23 PM  Result Value Ref Range   Heparin Unfractionated 0.12 (L) 0.30 - 0.70 IU/mL    Comment: (NOTE) If heparin results are below expected values, and patient dosage has  been confirmed, suggest follow up testing of antithrombin III levels. Performed at [facility], [place] [address], [place], [place] [address]   CBC with Differential/Platelet     Status: Abnormal   Collection Time: 01/09/20  3:44 AM  Result Value Ref Range   WBC 7.3 4.0 - 10.5 K/uL   RBC 2.77 (L) 4.22 - 5.81 MIL/uL   Hemoglobin 8.1 (L) 13.0 - 17.0 g/dL   HCT 25.2 (L) 39.0 - 52.0 %   MCV 91.0 80.0 - 100.0 fL   MCH 29.2 26.0 - 34.0 pg   MCHC 32.1 30.0 - 36.0 g/dL   RDW 13.2 11.5 - 15.5 %   Platelets 156 150 - 400 K/uL   nRBC 0.0 0.0 - 0.2 %   Neutrophils Relative % 59 %   Neutro Abs 4.2 1.7 - 7.7 K/uL   Lymphocytes Relative 26 %   Lymphs Abs 1.9 0.7 -  4.0 K/uL   Monocytes Relative 12 %   Monocytes Absolute  0.9 0.1 - 1.0 K/uL   Eosinophils Relative 3 %   Eosinophils Absolute 0.2 0.0 - 0.5 K/uL   Basophils Relative 0 %   Basophils Absolute 0.0 0.0 - 0.1 K/uL   Immature Granulocytes 0 %   Abs Immature Granulocytes 0.02 0.00 - 0.07 K/uL    Comment: Performed at [facility], [place] [address], [place], [place] [address]  Basic metabolic panel     Status: Abnormal   Collection Time: 01/09/20  3:44 AM  Result Value Ref Range   Sodium 135 135 - 145 mmol/L   Potassium 3.8 3.5 - 5.1 mmol/L   Chloride 93 (L) 98 - 111 mmol/L   CO2 28 22 - 32 mmol/L   Glucose, Bld 95 70 - 99 mg/dL    Comment: Glucose reference range applies only to samples taken after fasting for at least 8 hours.   BUN 21 (H) 6 - 20 mg/dL   Creatinine, Ser 6.43 (H) 0.61 - 1.24 mg/dL   Calcium 7.9 (L) 8.9 - 10.3 mg/dL   GFR calc non Af Amer 9 (L) >60 mL/min   GFR calc Af Amer 10 (L) >60 mL/min   Anion gap 14 5 - 15    Comment: Performed at [facility] [address] [place], [place] [address]  Heparin level (unfractionated)     Status: Abnormal   Collection Time: 01/09/20  3:44 AM  Result Value Ref Range   Heparin Unfractionated 0.23 (L) 0.30 - 0.70 IU/mL    Comment: (NOTE) If heparin results are below expected values, and patient dosage has  been confirmed, suggest follow up testing of antithrombin III levels. Performed at [facility], [place] [address], [place], [place] [address]   Renal function panel     Status: Abnormal   Collection Time: 01/09/20  8:16 AM  Result Value Ref Range   Sodium 134 (L) 135 - 145 mmol/L   Potassium 3.7 3.5 - 5.1 mmol/L   Chloride 95 (L) 98 - 111 mmol/L   CO2 26 22 - 32 mmol/L   Glucose, Bld 97 70 - 99 mg/dL    Comment: Glucose reference range applies only to samples taken after fasting for at least 8 hours.   BUN 24 (H) 6 - 20 mg/dL   Creatinine, Ser 6.97 (H) 0.61 - 1.24 mg/dL   Calcium 8.0 (L) 8.9 - 10.3 mg/dL   Phosphorus 4.6 2.5 - 4.6 mg/dL   Albumin 2.8 (L) 3.5 -  5.0 g/dL   GFR calc non Af Amer 8 (L) >60 mL/min   GFR calc Af Amer 9 (L) >60 mL/min   Anion gap 13 5 - 15    Comment: Performed at [facility] [address], [place], [place] [address]    ECG   N/A  Telemetry   Sinus rhythm - Personally Reviewed  Radiology    DG Chest 2 View  Result Date: 01/07/2020 CLINICAL DATA:  54 year old male with chest pain and shortness of breath. EXAM: CHEST - 2 VIEW COMPARISON:  Chest radiograph dated 11/10/2019. FINDINGS: There are bibasilar linear atelectasis/scarring. No focal consolidation, pleural effusion, or pneumothorax. Stable cardiomegaly. Left pectoral AICD device. No acute osseous pathology. IMPRESSION: 1. No acute cardiopulmonary process. 2. Stable cardiomegaly. Electronically Signed   By: Anner Crete M.D.   On: 01/07/2020 20:15    Cardiac Studies   Myoview pending  Assessment  1. Principal Problem: 2.   Chest pain 3. Active Problems: 4.   H/o Ventricular fibrillation Arrest now with AICD 5.   Anemia in chronic kidney disease 6.   ESRD on dialysis ([place]) 7.   Type II diabetes mellitus with renal manifestations ([place]) 8.   Chronic systolic CHF (congestive heart failure) ([place]) 9.   Plan   1. Still with " a little" chest pain, myoview pending. Echo performed, will review today. Tentative plan for ICD lead extraction tomorrow with Dr. Lovena Le per Dr. Caryl Comes.   Time Spent Directly with Patient:  I have spent a total of 25 minutes with the patient reviewing hospital notes, telemetry, EKGs, labs and examining the patient as well as establishing an assessment and plan that was discussed personally with the patient.  > 50% of time was spent in direct patient care.  Length of Stay:  LOS: 1 day   Pixie Casino, MD, [facility], [place] Director of the Advanced Lipid Disorders &  Cardiovascular Risk Reduction Clinic Diplomate of the American Board of Clinical Lipidology Attending Cardiologist   Direct Dial: [phone]  Fax: [phone]  Website:  [email or web address] [email or web address] Rotan 01/09/2020, 11:44 AM

## 2020-01-09 NOTE — Progress Notes (Signed)
PROGRESS NOTE    James Nicholson  EGB:[id number] DOB: 1966-09-02 DOA: 01/07/2020 PCP: [name], MD   Brief Narrative:  Patient is a 73 old [place] male with history of V. fib arrest status post ICD placement, chronic systolic heart failure, atrial fibrillation not on anticoagulation, Wegener's granulomatosis with ESRD on hemodialysis on TTS, peripheral vascular disease, type 2 diabetes mellitus, history of GI bleed, schizophrenia who presented with chest pain, shortness of breath, abdomen pain.  He speaks [place].  He reported 2 days history of left-sided chest pain associated with shortness of breath.  He follows with cardiology Dr. Lovena Le.  On presentation, he was found to have mild elevated troponin.  EKG did not show any significant change from prior.  Chest x-ray did not show any pneumonia.  Cardiology was consulted, there was concern for NSTEMI.  Started on heparin drip .Plan for nuclear stress test.  Assessment & Plan:   Principal Problem:   Chest pain Active Problems:   H/o Ventricular fibrillation Arrest now with AICD   Anemia in chronic kidney disease   ESRD on dialysis ([place])   Type II diabetes mellitus with renal manifestations (HCC)   Chronic systolic CHF (congestive heart failure) ([place])   Chest pain: Cardiology following. Mildly elevated troponin.  Started on heparin drip.  Chest pain sounds atypical.  He denies any chest pain today. LDL of 101.  Hemoglobin A1c of 4.5 Continue Lipitor, aspirin. Echocardiogram pending. Plan for nuclear stress test.  Abdomen, nausea, vomiting: Presented with nausea, vomiting.  Continue antiemetics, PPI.  There was some mild tenderness on the epigastric region.  Lipase normal.  The symptoms have been much better today.  History of V. fib arrest: Status post ICD placement.  Follows with Dr. Lovena Le.He was planned for defillation lead replacement on Monday.Apparently the defibrillator is not functioning well and was beeping every four  at home as per brother.  Paroxysmal A. Fib:Not  On anticoagulation.  Currently rate is controlled and he is in NSR.  Continue metoprolol  ESRD on dialysis: Secondary to Wegener's granulomatosis.  Dialyzed on TTS schedule.  Nephrology following.  He has AV graft on the right forearm  Chronic systolic heart failure: Currently euvolemic.  Fluid management as per dialysis.echo pending  Normocytic anemia: Associated with CKD.  Continue to monitor CBC  Type 2 diabetes mellitus: Controlled.  Continue sliding scale insulin for now            DVT prophylaxis:IV heparin Code Status: Full Family Communication: discussed with  brother on phone on 01/08/2020 Disposition Plan: Patient is from home.  Anticipate discharge to home after full work-up,waiting for nuclear stress test, lead revision of his defibrillator   Consultants: Cardiology  Procedures: None  Antimicrobials:  Anti-infectives (From admission, onward)   Start     Dose/Rate Route Frequency Ordered Stop   01/08/20 0000  cefTRIAXone (ROCEPHIN) 1 g in sodium chloride 0.9 % 100 mL IVPB  Status:  Discontinued     1 g 200 mL/hr over 30 Minutes Intravenous Every 24 hours 01/07/20 2358 01/08/20 0005   01/08/20 0000  azithromycin (ZITHROMAX) 500 mg in sodium chloride 0.9 % 250 mL IVPB  Status:  Discontinued     500 mg 250 mL/hr over 60 Minutes Intravenous Every 24 hours 01/07/20 2358 01/08/20 0005      Subjective: Patient seen and examined the bedside this morning.  Hemodynamically stable.  Denies any nausea, vomiting or chest pain today.    Objective: Vitals:   01/09/20 0500 01/09/20 0600  01/09/20 0730 01/09/20 0739  BP: 111/79 103/65 116/76   Pulse: 79 82 84 81  Resp: 19 20 (!) 21 18  Temp:   98 F (36.7 C)   TempSrc:   Oral   SpO2: 90% 97% 97% 95%  Weight:      Height:        Intake/Output Summary (Last 24 hours) at 01/09/2020 0804 Last data filed at 01/09/2020 0300 Gross per 24 hour  Intake 1028.83 ml  Output  1500 ml  Net -471.17 ml   Filed Weights   01/07/20 1937 01/08/20 1025 01/08/20 1437  Weight: 80 kg 77.6 kg 75.7 kg    Examination:   General exam: Comfortable Respiratory system: Bilateral equal air entry, normal vesicular breath sounds, no wheezes or crackles  Cardiovascular system: S1 & S2 heard, RRR. No JVD, murmurs, rubs, gallops or clicks. Gastrointestinal system: Abdomen is nondistended, soft and nontender. No organomegaly or masses felt. Normal bowel sounds heard. Central nervous system: Alert and oriented. No focal neurological deficits. Extremities: No edema, no clubbing ,no cyanosis, distal peripheral pulses palpable.  AV graft on the right forearm Skin: No rashes, lesions or ulcers,no icterus ,no pallor    Data Reviewed: I have personally reviewed following labs and imaging studies  CBC: Recent Labs  Lab 01/07/20 1953 01/08/20 1048 01/09/20 0344  WBC 6.2 5.5 7.3  NEUTROABS  --   --  4.2  HGB 8.7* 7.6* 8.1*  HCT 27.8* 24.4* 25.2*  MCV 93.9 94.9 91.0  PLT 175 141* 063   Basic Metabolic Panel: Recent Labs  Lab 01/07/20 1953 01/08/20 0215 01/09/20 0344  NA 141 141 135  K 4.5 4.3 3.8  CL 99 101 93*  CO2 27 24 28   GLUCOSE 103* 102* 95  BUN 34* 46* 21*  CREATININE 8.48* 9.36* 6.43*  CALCIUM 8.7* 8.5* 7.9*   GFR: Estimated Creatinine Clearance: 13.3 mL/min (A) (by C-G formula based on SCr of 6.43 mg/dL (H)). Liver Function Tests: No results for input(s): AST, ALT, ALKPHOS, BILITOT, PROT, ALBUMIN in the last 168 hours. Recent Labs  Lab 01/08/20 0002  LIPASE 35   No results for input(s): AMMONIA in the last 168 hours. Coagulation Profile: Recent Labs  Lab 01/07/20 1953  INR 1.1   Cardiac Enzymes: No results for input(s): CKTOTAL, CKMB, CKMBINDEX, TROPONINI in the last 168 hours. BNP (last 3 results) No results for input(s): PROBNP in the last 8760 hours. HbA1C: Recent Labs    01/08/20 0335  HGBA1C 4.5*   CBG: No results for input(s):  GLUCAP in the last 168 hours. Lipid Profile: Recent Labs    01/08/20 0335  CHOL 142  HDL 25*  LDLCALC 101*  TRIG 80  CHOLHDL 5.7   Thyroid Function Tests: Recent Labs    01/08/20 0335  TSH 1.912   Anemia Panel: No results for input(s): VITAMINB12, FOLATE, FERRITIN, TIBC, IRON, RETICCTPCT in the last 72 hours. Sepsis Labs: Recent Labs  Lab 01/08/20 0002  PROCALCITON 0.45    Recent Results (from the past 240 hour(s))  SARS CORONAVIRUS 2 (TAT 6-24 HRS) Nasopharyngeal Nasopharyngeal Swab     Status: None   Collection Time: 01/07/20  8:40 AM   Specimen: Nasopharyngeal Swab  Result Value Ref Range Status   SARS Coronavirus 2 NEGATIVE NEGATIVE Final    Comment: (NOTE) SARS-CoV-2 target nucleic acids are NOT DETECTED. The SARS-CoV-2 RNA is generally detectable in upper and lower respiratory specimens during the acute phase of infection. Negative results do not preclude SARS-CoV-2  infection, do not rule out co-infections with other pathogens, and should not be used as the sole basis for treatment or other patient management decisions. Negative results must be combined with clinical observations, patient history, and epidemiological information. The expected result is Negative. Fact Sheet for Patients: SugarRoll.be Fact Sheet for Healthcare Providers: https://www.woods-mathews.com/ This test is not yet approved or cleared by the [place] FDA and  has been authorized for detection and/or diagnosis of SARS-CoV-2 by FDA under an Emergency Use Authorization (EUA). This EUA will remain  in effect (meaning this test can be used) for the duration of the COVID-19 declaration under Section 56 4(b)(1) of the Act, 21 U.S.C. section 360bbb-3(b)(1), unless the authorization is terminated or revoked sooner. Performed at [facility], [place] [address] [place], [place] [address]   SARS CORONAVIRUS 2 (TAT 6-24 HRS) Nasopharyngeal  Nasopharyngeal Swab     Status: None   Collection Time: 01/07/20 11:51 PM   Specimen: Nasopharyngeal Swab  Result Value Ref Range Status   SARS Coronavirus 2 NEGATIVE NEGATIVE Final    Comment: (NOTE) SARS-CoV-2 target nucleic acids are NOT DETECTED. The SARS-CoV-2 RNA is generally detectable in upper and lower respiratory specimens during the acute phase of infection. Negative results do not preclude SARS-CoV-2 infection, do not rule out co-infections with other pathogens, and should not be used as the sole basis for treatment or other patient management decisions. Negative results must be combined with clinical observations, patient history, and epidemiological information. The expected result is Negative. Fact Sheet for Patients: SugarRoll.be Fact Sheet for Healthcare Providers: https://www.woods-mathews.com/ This test is not yet approved or cleared by the [place] FDA and  has been authorized for detection and/or diagnosis of SARS-CoV-2 by FDA under an Emergency Use Authorization (EUA). This EUA will remain  in effect (meaning this test can be used) for the duration of the COVID-19 declaration under Section 56 4(b)(1) of the Act, 21 U.S.C. section 360bbb-3(b)(1), unless the authorization is terminated or revoked sooner. Performed at [facility], [place] [address] [place], [place] [address]   Respiratory Panel by PCR     Status: None   Collection Time: 01/08/20 12:14 AM   Specimen: Nasopharyngeal Swab; Respiratory  Result Value Ref Range Status   Adenovirus NOT DETECTED NOT DETECTED Final   Coronavirus 229E NOT DETECTED NOT DETECTED Final    Comment: (NOTE) The Coronavirus on the Respiratory Panel, DOES NOT test for the novel  Coronavirus (2019 nCoV)    Coronavirus HKU1 NOT DETECTED NOT DETECTED Final   Coronavirus NL63 NOT DETECTED NOT DETECTED Final   Coronavirus OC43 NOT DETECTED NOT DETECTED Final   Metapneumovirus NOT  DETECTED NOT DETECTED Final   Rhinovirus / Enterovirus NOT DETECTED NOT DETECTED Final   Influenza A NOT DETECTED NOT DETECTED Final   Influenza B NOT DETECTED NOT DETECTED Final   Parainfluenza Virus 1 NOT DETECTED NOT DETECTED Final   Parainfluenza Virus 2 NOT DETECTED NOT DETECTED Final   Parainfluenza Virus 3 NOT DETECTED NOT DETECTED Final   Parainfluenza Virus 4 NOT DETECTED NOT DETECTED Final   Respiratory Syncytial Virus NOT DETECTED NOT DETECTED Final   Bordetella pertussis NOT DETECTED NOT DETECTED Final   Chlamydophila pneumoniae NOT DETECTED NOT DETECTED Final   Mycoplasma pneumoniae NOT DETECTED NOT DETECTED Final    Comment: Performed at [facility], [place]. [address], [place], [place] [address]         Radiology Studies: DG Chest 2 View  Result  Date: 01/07/2020 CLINICAL DATA:  54 year old male with chest pain and shortness of breath. EXAM: CHEST - 2 VIEW COMPARISON:  Chest radiograph dated 11/10/2019. FINDINGS: There are bibasilar linear atelectasis/scarring. No focal consolidation, pleural effusion, or pneumothorax. Stable cardiomegaly. Left pectoral AICD device. No acute osseous pathology. IMPRESSION: 1. No acute cardiopulmonary process. 2. Stable cardiomegaly. Electronically Signed   By: Anner Crete M.D.   On: 01/07/2020 20:15        Scheduled Meds: . atorvastatin  40 mg Oral Daily  . Chlorhexidine Gluconate Cloth  6 each Topical Q0600  . darbepoetin (ARANESP) injection - DIALYSIS  100 mcg Intravenous Q Sat-HD  . doxercalciferol  4 mcg Intravenous Q T,Th,Sa-HD  . ferric citrate  420 mg Oral TID WC  . metoprolol succinate  50 mg Oral Daily  . pantoprazole (PROTONIX) IV  40 mg Intravenous Q24H  . sodium chloride flush  3 mL Intravenous Once   Continuous Infusions: . sodium chloride    . sodium chloride    . heparin 1,600 Units/hr (01/09/20 0738)  . nitroGLYCERIN 30 mcg/min (01/09/20 0740)     LOS: 1 day    Time spent:35 mins. More than 50% of  that time was spent in counseling and/or coordination of care.      Shelly Coss, MD [facility] P3/14/2021, 8:04 AM

## 2020-01-10 ENCOUNTER — Encounter (HOSPITAL_COMMUNITY)
Admission: EM | Disposition: A | Discharge status: Home / Self Care | Payer: Self-pay | Source: Home / Self Care | Attending: Internal Medicine

## 2020-01-10 ENCOUNTER — Inpatient Hospital Stay (HOSPITAL_COMMUNITY): Payer: Medicare Other

## 2020-01-10 ENCOUNTER — Inpatient Hospital Stay (HOSPITAL_COMMUNITY): Payer: Medicare Other | Admitting: Critical Care Medicine

## 2020-01-10 ENCOUNTER — Encounter (HOSPITAL_COMMUNITY): Payer: Self-pay | Admitting: Family Medicine

## 2020-01-10 ENCOUNTER — Inpatient Hospital Stay (HOSPITAL_COMMUNITY): Admission: RE | Admit: 2020-01-10 | Payer: Medicare Other | Source: Home / Self Care | Admitting: Internal Medicine

## 2020-01-10 DIAGNOSIS — T82110A Breakdown (mechanical) of cardiac electrode, initial encounter: Secondary | ICD-10-CM

## 2020-01-10 DIAGNOSIS — R079 Chest pain, unspecified: Secondary | ICD-10-CM

## 2020-01-10 DIAGNOSIS — I428 Other cardiomyopathies: Secondary | ICD-10-CM

## 2020-01-10 HISTORY — DX: Pneumonia, unspecified organism: J18.9

## 2020-01-10 HISTORY — PX: TEE WITHOUT CARDIOVERSION: SHX5443

## 2020-01-10 HISTORY — PX: ICD LEAD REMOVAL: SHX5855

## 2020-01-10 LAB — CBC
HCT: 25.8 % — ABNORMAL LOW (ref 39.0–52.0)
Hemoglobin: 8.4 g/dL — ABNORMAL LOW (ref 13.0–17.0)
MCH: 29.5 pg (ref 26.0–34.0)
MCHC: 32.6 g/dL (ref 30.0–36.0)
MCV: 90.5 fL (ref 80.0–100.0)
Platelets: 158 10*3/uL (ref 150–400)
RBC: 2.85 MIL/uL — ABNORMAL LOW (ref 4.22–5.81)
RDW: 13.3 % (ref 11.5–15.5)
WBC: 6.8 10*3/uL (ref 4.0–10.5)
nRBC: 0 % (ref 0.0–0.2)

## 2020-01-10 LAB — NM MYOCAR MULTI W/SPECT W/WALL MOTION / EF
Estimated workload: 1 METS
Exercise duration (min): 0 min
Exercise duration (sec): 0 s
MPHR: 167 {beats}/min
Peak HR: 107 {beats}/min
Percent HR: 64 %
Rest HR: 81 {beats}/min

## 2020-01-10 LAB — HEPARIN LEVEL (UNFRACTIONATED): Heparin Unfractionated: 0.3 IU/mL (ref 0.30–0.70)

## 2020-01-10 LAB — ECHO INTRAOPERATIVE TEE
Height: 69 in
Weight: 2670.21 oz

## 2020-01-10 LAB — PREPARE RBC (CROSSMATCH)

## 2020-01-10 LAB — SURGICAL PCR SCREEN
MRSA, PCR: NEGATIVE
Staphylococcus aureus: NEGATIVE

## 2020-01-10 LAB — GLUCOSE, CAPILLARY: Glucose-Capillary: 87 mg/dL (ref 70–99)

## 2020-01-10 SURGERY — REMOVAL, ELECTRODE LEAD, ICD
Anesthesia: General | Site: Chest

## 2020-01-10 MED ORDER — ONDANSETRON HCL 4 MG/2ML IJ SOLN: 4.0000 mg | Freq: Four times a day (QID) | INTRAMUSCULAR | Status: DC | PRN

## 2020-01-10 MED ORDER — PROPOFOL 10 MG/ML IV BOLUS
INTRAVENOUS | Status: AC
  Filled 2020-01-10: qty 20

## 2020-01-10 MED ORDER — TECHNETIUM TC 99M TETROFOSMIN IV KIT
11.0000 | PACK | Freq: Once | INTRAVENOUS | Status: AC | PRN
  Administered 2020-01-10: 11 via INTRAVENOUS

## 2020-01-10 MED ORDER — ONDANSETRON HCL 4 MG/2ML IJ SOLN
INTRAMUSCULAR | Status: AC
  Filled 2020-01-10: qty 2

## 2020-01-10 MED ORDER — TECHNETIUM TC 99M TETROFOSMIN IV KIT
33.0000 | PACK | Freq: Once | INTRAVENOUS | Status: AC | PRN
  Administered 2020-01-10: 12:00:00 33 via INTRAVENOUS

## 2020-01-10 MED ORDER — SODIUM CHLORIDE 0.9 % IV SOLN: INTRAVENOUS | Status: DC

## 2020-01-10 MED ORDER — FENTANYL CITRATE (PF) 100 MCG/2ML IJ SOLN
25.0000 ug | INTRAMUSCULAR | Status: DC | PRN
  Administered 2020-01-10 (×2): 50 ug via INTRAVENOUS

## 2020-01-10 MED ORDER — FENTANYL CITRATE (PF) 250 MCG/5ML IJ SOLN
INTRAMUSCULAR | Status: AC
  Filled 2020-01-10: qty 5

## 2020-01-10 MED ORDER — CHLORHEXIDINE GLUCONATE 4 % EX LIQD
4.0000 "application " | Freq: Once | CUTANEOUS | Status: AC
  Administered 2020-01-10: 4 via TOPICAL
  Filled 2020-01-10: qty 60

## 2020-01-10 MED ORDER — PHENYLEPHRINE HCL-NACL 10-0.9 MG/250ML-% IV SOLN
INTRAVENOUS | Status: DC | PRN
  Administered 2020-01-10: 20 ug/min via INTRAVENOUS

## 2020-01-10 MED ORDER — ROCURONIUM BROMIDE 10 MG/ML (PF) SYRINGE
PREFILLED_SYRINGE | INTRAVENOUS | Status: DC | PRN
  Administered 2020-01-10: 60 mg via INTRAVENOUS
  Administered 2020-01-10: 40 mg via INTRAVENOUS

## 2020-01-10 MED ORDER — DEXAMETHASONE SODIUM PHOSPHATE 10 MG/ML IJ SOLN
INTRAMUSCULAR | Status: AC
  Filled 2020-01-10: qty 1

## 2020-01-10 MED ORDER — DEXAMETHASONE SODIUM PHOSPHATE 10 MG/ML IJ SOLN
INTRAMUSCULAR | Status: DC | PRN
  Administered 2020-01-10: 5 mg via INTRAVENOUS

## 2020-01-10 MED ORDER — SODIUM CHLORIDE 0.9% IV SOLUTION: Freq: Once | INTRAVENOUS | Status: DC

## 2020-01-10 MED ORDER — PHENYLEPHRINE 40 MCG/ML (10ML) SYRINGE FOR IV PUSH (FOR BLOOD PRESSURE SUPPORT)
PREFILLED_SYRINGE | INTRAVENOUS | Status: DC | PRN
  Administered 2020-01-10 (×3): 80 ug via INTRAVENOUS

## 2020-01-10 MED ORDER — REGADENOSON 0.4 MG/5ML IV SOLN
0.4000 mg | Freq: Once | INTRAVENOUS | Status: AC
  Administered 2020-01-10: 0.4 mg via INTRAVENOUS

## 2020-01-10 MED ORDER — SODIUM CHLORIDE 0.9 % IV SOLN
INTRAVENOUS | Status: AC
  Filled 2020-01-10 (×2): qty 2

## 2020-01-10 MED ORDER — LIDOCAINE 2% (20 MG/ML) 5 ML SYRINGE
INTRAMUSCULAR | Status: DC | PRN
  Administered 2020-01-10: 60 mg via INTRAVENOUS

## 2020-01-10 MED ORDER — REGADENOSON 0.4 MG/5ML IV SOLN
INTRAVENOUS | Status: AC
  Filled 2020-01-10: qty 5

## 2020-01-10 MED ORDER — ONDANSETRON HCL 4 MG/2ML IJ SOLN
INTRAMUSCULAR | Status: DC | PRN
  Administered 2020-01-10: 4 mg via INTRAVENOUS

## 2020-01-10 MED ORDER — MIDAZOLAM HCL 2 MG/2ML IJ SOLN
INTRAMUSCULAR | Status: AC
  Filled 2020-01-10: qty 2

## 2020-01-10 MED ORDER — HEPARIN (PORCINE) IN NACL 2-0.9 UNITS/ML
INTRAMUSCULAR | Status: AC | PRN
  Administered 2020-01-10: 1 via INTRAVENOUS

## 2020-01-10 MED ORDER — SODIUM CHLORIDE 0.9 % IV SOLN
INTRAVENOUS | Status: DC | PRN
  Administered 2020-01-10: 500 mL

## 2020-01-10 MED ORDER — CEFAZOLIN SODIUM-DEXTROSE 2-4 GM/100ML-% IV SOLN
2.0000 g | INTRAVENOUS | Status: AC
  Administered 2020-01-10: 2 g via INTRAVENOUS
  Filled 2020-01-10: qty 100

## 2020-01-10 MED ORDER — PROMETHAZINE HCL 25 MG/ML IJ SOLN: 6.2500 mg | INTRAMUSCULAR | Status: DC | PRN

## 2020-01-10 MED ORDER — LIDOCAINE 2% (20 MG/ML) 5 ML SYRINGE
INTRAMUSCULAR | Status: AC
  Filled 2020-01-10: qty 20

## 2020-01-10 MED ORDER — PROPOFOL 10 MG/ML IV BOLUS
INTRAVENOUS | Status: DC | PRN
  Administered 2020-01-10: 150 mg via INTRAVENOUS
  Administered 2020-01-10 (×2): 50 mg via INTRAVENOUS

## 2020-01-10 MED ORDER — GENTAMICIN IN SALINE 1.6-0.9 MG/ML-% IV SOLN
INTRAVENOUS | Status: AC
  Filled 2020-01-10: qty 50

## 2020-01-10 MED ORDER — MUPIROCIN 2 % EX OINT: 1.0000 "application " | TOPICAL_OINTMENT | Freq: Once | CUTANEOUS | Status: AC

## 2020-01-10 MED ORDER — ROCURONIUM BROMIDE 10 MG/ML (PF) SYRINGE
PREFILLED_SYRINGE | INTRAVENOUS | Status: AC
  Filled 2020-01-10: qty 30

## 2020-01-10 MED ORDER — SODIUM CHLORIDE 0.9 % IV SOLN: INTRAVENOUS | Status: DC | PRN

## 2020-01-10 MED ORDER — MUPIROCIN 2 % EX OINT
TOPICAL_OINTMENT | CUTANEOUS | Status: AC
  Administered 2020-01-10: 1 via TOPICAL
  Filled 2020-01-10: qty 22

## 2020-01-10 MED ORDER — PHENYLEPHRINE HCL-NACL 10-0.9 MG/250ML-% IV SOLN
INTRAVENOUS | Status: AC
  Filled 2020-01-10: qty 250

## 2020-01-10 MED ORDER — SODIUM CHLORIDE 0.9 % IV SOLN
INTRAVENOUS | Status: AC
  Filled 2020-01-10: qty 1.2

## 2020-01-10 MED ORDER — SODIUM CHLORIDE 0.9 % IV SOLN: 80.0000 mg | INTRAVENOUS | Status: DC

## 2020-01-10 MED ORDER — MIDAZOLAM HCL 5 MG/5ML IJ SOLN
INTRAMUSCULAR | Status: DC | PRN
  Administered 2020-01-10: 2 mg via INTRAVENOUS

## 2020-01-10 MED ORDER — SUGAMMADEX SODIUM 200 MG/2ML IV SOLN
INTRAVENOUS | Status: DC | PRN
  Administered 2020-01-10 (×2): 100 mg via INTRAVENOUS

## 2020-01-10 MED ORDER — FENTANYL CITRATE (PF) 100 MCG/2ML IJ SOLN
INTRAMUSCULAR | Status: AC
  Filled 2020-01-10: qty 2

## 2020-01-10 MED ORDER — CEFAZOLIN SODIUM-DEXTROSE 1-4 GM/50ML-% IV SOLN
1.0000 g | Freq: Four times a day (QID) | INTRAVENOUS | Status: AC
  Administered 2020-01-10 – 2020-01-11 (×3): 1 g via INTRAVENOUS
  Filled 2020-01-10 (×3): qty 50

## 2020-01-10 MED ORDER — HEPARIN (PORCINE) IN NACL 2-0.9 UNITS/ML: INTRAMUSCULAR | Status: DC | PRN

## 2020-01-10 MED ORDER — ACETAMINOPHEN 325 MG PO TABS: 325.0000 mg | ORAL_TABLET | ORAL | Status: DC | PRN

## 2020-01-10 MED ORDER — FENTANYL CITRATE (PF) 250 MCG/5ML IJ SOLN
INTRAMUSCULAR | Status: DC | PRN
  Administered 2020-01-10 (×4): 50 ug via INTRAVENOUS
  Administered 2020-01-10: 100 ug via INTRAVENOUS

## 2020-01-10 MED ORDER — ETOMIDATE 2 MG/ML IV SOLN
INTRAVENOUS | Status: AC
  Filled 2020-01-10: qty 10

## 2020-01-10 MED ORDER — SODIUM CHLORIDE 0.9 % IV SOLN
INTRAVENOUS | Status: AC
  Filled 2020-01-10: qty 2

## 2020-01-10 SURGICAL SUPPLY — 64 items
ACCESS 6719 PIN PLUG ASSY KIT DF-1 PORT ×3 IMPLANT
BAG BANDED W/RUBBER/TAPE 36X54 (MISCELLANEOUS) ×3 IMPLANT
BAG DECANTER FOR FLEXI CONT (MISCELLANEOUS) ×9 IMPLANT
BLADE CLIPPER SURG (BLADE) ×3 IMPLANT
BLADE OSCILLATING /SAGITTAL (BLADE) IMPLANT
BLADE STERNUM SYSTEM 6 (BLADE) ×3 IMPLANT
BNDG COHESIVE 4X5 WHT NS (GAUZE/BANDAGES/DRESSINGS) ×3 IMPLANT
CANISTER SUCT 3000ML PPV (MISCELLANEOUS) ×3 IMPLANT
COVER BACK TABLE 60X90IN (DRAPES) ×3 IMPLANT
COVER SURGICAL LIGHT HANDLE (MISCELLANEOUS) ×3 IMPLANT
COVER WAND RF STERILE (DRAPES) ×3 IMPLANT
DERMABOND ADVANCED (GAUZE/BANDAGES/DRESSINGS) ×1
DERMABOND ADVANCED .7 DNX12 (GAUZE/BANDAGES/DRESSINGS) ×2 IMPLANT
DEVICE LCKNG LEAD CARDIAC (CATHETERS) ×2 IMPLANT
DRAPE C-ARM 42X72 X-RAY (DRAPES) IMPLANT
DRAPE CARDIOVASCULAR INCISE (DRAPES) ×1
DRAPE HALF SHEET 40X57 (DRAPES) ×6 IMPLANT
DRAPE INCISE IOBAN 66X45 STRL (DRAPES) IMPLANT
DRAPE SRG 135X102X78XABS (DRAPES) ×2 IMPLANT
DRSG TEGADERM 2-3/8X2-3/4 SM (GAUZE/BANDAGES/DRESSINGS) ×3 IMPLANT
DRSG TEGADERM 4X4.5 CHG (GAUZE/BANDAGES/DRESSINGS) ×3 IMPLANT
ELECT REM PT RETURN 9FT ADLT (ELECTROSURGICAL) ×6
ELECTRODE REM PT RTRN 9FT ADLT (ELECTROSURGICAL) ×4 IMPLANT
EXTENDER BULLDOG LEAD (MISCELLANEOUS) ×3 IMPLANT
FELT TEFLON 1X6 (MISCELLANEOUS) IMPLANT
GAUZE 4X4 16PLY RFD (DISPOSABLE) ×6 IMPLANT
GAUZE SPONGE 4X4 12PLY STRL (GAUZE/BANDAGES/DRESSINGS) ×3 IMPLANT
GAUZE SPONGE 4X4 12PLY STRL LF (GAUZE/BANDAGES/DRESSINGS) ×3 IMPLANT
GLOVE BIO SURGEON STRL SZ7 (GLOVE) ×3 IMPLANT
GLOVE BIOGEL PI IND STRL 6 (GLOVE) ×2 IMPLANT
GLOVE BIOGEL PI IND STRL 7.5 (GLOVE) ×8 IMPLANT
GLOVE BIOGEL PI INDICATOR 6 (GLOVE) ×1
GLOVE BIOGEL PI INDICATOR 7.5 (GLOVE) ×4
GLOVE ECLIPSE 7.5 STRL STRAW (GLOVE) ×6 IMPLANT
GLOVE ECLIPSE 8.0 STRL XLNG CF (GLOVE) ×9 IMPLANT
GOWN STRL REUS W/ TWL LRG LVL3 (GOWN DISPOSABLE) ×2 IMPLANT
GOWN STRL REUS W/ TWL XL LVL3 (GOWN DISPOSABLE) ×2 IMPLANT
GOWN STRL REUS W/TWL LRG LVL3 (GOWN DISPOSABLE) ×1
GOWN STRL REUS W/TWL XL LVL3 (GOWN DISPOSABLE) ×1
KIT TURNOVER KIT B (KITS) ×3 IMPLANT
LEAD SPRINT QUAT SEC 6935-65CM (Lead) ×3 IMPLANT
NS IRRIG 1000ML POUR BTL (IV SOLUTION) IMPLANT
PACEMAKER PLUG DF-1 (MISCELLANEOUS) IMPLANT
PAD ARMBOARD 7.5X6 YLW CONV (MISCELLANEOUS) ×6 IMPLANT
PAD ELECT DEFIB RADIOL ZOLL (MISCELLANEOUS) ×3 IMPLANT
POUCH AIGIS-R ANTIBACT ICD (Mesh General) ×3 IMPLANT
REMOVAL LLD CARDIAC LEAD EZ (CATHETERS) ×3
SHEATH 11 SUB-C ROTATE DILATOR (SHEATH) ×3 IMPLANT
SHEATH CLASSIC 9.5F (SHEATH) ×3 IMPLANT
SHEATH EVOLUTION RL 13F (SHEATH) ×6 IMPLANT
SHEATH TIGHTRAIL MECH 13F (SHEATH) ×3 IMPLANT
SUT PROLENE 2 0 CT2 30 (SUTURE) IMPLANT
SUT PROLENE 2 0 SH DA (SUTURE) IMPLANT
SUT SILK 1 SH (SUTURE) ×6 IMPLANT
SUT VIC AB 2-0 CT2 18 VCP726D (SUTURE) IMPLANT
SUT VIC AB 2-0 CT2 27 (SUTURE) ×3 IMPLANT
SUT VIC AB 3-0 X1 27 (SUTURE) ×6 IMPLANT
TAPE CLOTH SURG 4X10 WHT LF (GAUZE/BANDAGES/DRESSINGS) ×3 IMPLANT
TOWEL GREEN STERILE (TOWEL DISPOSABLE) ×6 IMPLANT
TOWEL GREEN STERILE FF (TOWEL DISPOSABLE) ×6 IMPLANT
TRAY FOLEY MTR SLVR 16FR STAT (SET/KITS/TRAYS/PACK) ×3 IMPLANT
TUBE CONNECTING 12X1/4 (SUCTIONS) IMPLANT
WIRE EMERALD 3MM-J .035X150CM (WIRE) ×3 IMPLANT
YANKAUER SUCT BULB TIP NO VENT (SUCTIONS) IMPLANT

## 2020-01-10 NOTE — Progress Notes (Signed)
   James Nicholson presented for a nuclear stress test today.  No immediate complications.  Stress imaging is pending at this time.  Preliminary EKG findings may be listed in the chart, but the stress test result will not be finalized until perfusion imaging is complete.  Patient was given zofran for nausea during his stress test. Symptoms improved. Kept NPO for possible ICD extraction later today.  1 day study, [facility] Radiology to read.  Abigail Butts, PA-C 01/10/2020, 11:45 AM

## 2020-01-10 NOTE — Progress Notes (Addendum)
Progress Note  Patient Name: James Nicholson Date of Encounter: 01/10/2020  Primary Cardiologist: Dr. Lovena Le   Today's visit is aided by [employer] [place] translator, Miralem # [phone]  Subjective   Dry cough intermittently, not SOB.  CP is worse with cough, palpation of his chest wall, and when laying on his L side  Inpatient Medications    Scheduled Meds: . sodium chloride   Intravenous Once  . atorvastatin  40 mg Oral Daily  . Chlorhexidine Gluconate Cloth  6 each Topical Q0600  . darbepoetin (ARANESP) injection - DIALYSIS  100 mcg Intravenous Q Sat-HD  . doxercalciferol  4 mcg Intravenous Q T,Th,Sa-HD  . ferric citrate  420 mg Oral TID WC  . gentamicin irrigation  80 mg Irrigation To SSTC  . metoprolol succinate  50 mg Oral Daily  . pantoprazole (PROTONIX) IV  40 mg Intravenous Q24H  . sodium chloride flush  3 mL Intravenous Once   Continuous Infusions: . sodium chloride    . sodium chloride    . sodium chloride    .  ceFAZolin (ANCEF) IV     PRN Meds: sodium chloride, sodium chloride, acetaminophen, lidocaine (PF), lidocaine-prilocaine, ondansetron (ZOFRAN) IV, pentafluoroprop-tetrafluoroeth, promethazine   Vital Signs    Vitals:   01/09/20 2324 01/10/20 0454 01/10/20 0753 01/10/20 0755  BP: (!) 143/79 (!) 142/81  (!) 144/77  Pulse: 87 87  83  Resp: 20 20  20   Temp: 98.9 F (37.2 C) 98.3 F (36.8 C) 98 F (36.7 C)   TempSrc: Oral Oral Oral   SpO2: 95% 98%  95%  Weight:      Height:       No intake or output data in the 24 hours ending 01/10/20 0829 Last 3 Weights 01/08/2020 01/08/2020 01/07/2020  Weight (lbs) 166 lb 14.2 oz 171 lb 1.2 oz 176 lb 5.9 oz  Weight (kg) 75.7 kg 77.6 kg 80 kg      Telemetry    SR - Personally Reviewed  ECG    No new EKGs - Personally Reviewed  Physical Exam   GEN: No acute distress.   Neck: No JVD Cardiac: RRR, no murmurs, rubs, or gallops.  Respiratory: CTA b/l. GI: Soft, nontender, non-distended  MS: No edema; No  deformity. Neuro:  Nonfocal  Psych: Normal affect   Labs    High Sensitivity Troponin:   Recent Labs  Lab 01/07/20 1953 01/07/20 2201 01/08/20 0002 01/08/20 0215  TROPONINIHS 41* 39* 35* 44*      Chemistry Recent Labs  Lab 01/08/20 0215 01/09/20 0344 01/09/20 0816  NA 141 135 134*  K 4.3 3.8 3.7  CL 101 93* 95*  CO2 24 28 26   GLUCOSE 102* 95 97  BUN 46* 21* 24*  CREATININE 9.36* 6.43* 6.97*  CALCIUM 8.5* 7.9* 8.0*  ALBUMIN  --   --  2.8*  GFRNONAA 6* 9* 8*  GFRAA 7* 10* 9*  ANIONGAP 16* 14 13     Hematology Recent Labs  Lab 01/08/20 1048 01/09/20 0344 01/10/20 0136  WBC 5.5 7.3 6.8  RBC 2.57* 2.77* 2.85*  HGB 7.6* 8.1* 8.4*  HCT 24.4* 25.2* 25.8*  MCV 94.9 91.0 90.5  MCH 29.6 29.2 29.5  MCHC 31.1 32.1 32.6  RDW 13.4 13.2 13.3  PLT 141* 156 158    BNPNo results for input(s): BNP, PROBNP in the last 168 hours.   DDimer No results for input(s): DDIMER in the last 168 hours.   Radiology  Cardiac Studies    01/09/2020: TTE IMPRESSIONS  1. Severe biventricular failure.  2. Left ventricular ejection fraction, by estimation, is 30 to 35%. The  left ventricle has moderately decreased function. The left ventricle has  no regional wall motion abnormalities. The left ventricular internal  cavity size was moderately dilated.  Left ventricular diastolic parameters are consistent with Grade II  diastolic dysfunction (pseudonormalization). Elevated left atrial  pressure.  3. Right ventricular systolic function is severely reduced. The right  ventricular size is severely enlarged. There is mildly elevated pulmonary  artery systolic pressure. The estimated right ventricular systolic  pressure is 32.2 mmHg.  4. Left atrial size was moderately dilated.  5. Right atrial size was moderately dilated.  6. The mitral valve is normal in structure. Mild mitral valve  regurgitation. No evidence of mitral stenosis.  7. The aortic valve is normal in  structure. Aortic valve regurgitation is  not visualized. No aortic stenosis is present.  8. The inferior vena cava is normal in size with greater than 50%  respiratory variability, suggesting right atrial pressure of 3 mmHg.    12/23/2013: TTE, LVEF 45-50%  Patient Profile     54 y.o. male w/PMHx of resuscitated VF arrest w/ICD, HTN, HLD, CM of unknown etiology, chronic CHF (systolic), schizophrenia, ESRF on HD (Tues/Thurs/Sat), Wegener's granulomatosis.  There is an unclear h/o AFib, unknown when this was.   Admitted to [facility]07/20 with CP, SOB, reports of difficult sleep, epigastric discomfort with an episode of vomiting the day of admission, worsening cough ER temp was 99.2 HS Trop 41 > 39, he was initially suspect to be NSTEMI started on heparin gtt and NTG gg CXR was clear Also reports of intermittent chills/night sweats for a couple weeks CP felt mostly atypical sounding, NTG gtt stopped, planned for myoview to evaluate further  The patient was already scheduled from outpatient/office to have ICD RV lead extraction and new lead implant 2/2 RV lead failure.   Device information: MDT single chamber ICD, implanted 02/09/09, secondary prevention >> gen change 12/05/2017  Assessment & Plan    1. CP     Mild abn HS trop     CP felt largely to be atypical     Trop likely 2/2 ESRF      For stress myoview today      In d/w Dr. Lovena Le this AM, plans to proceed with extraction case today regardless      Stopped his heparin this AM for extraction case  2. Cough     Reports of some degree of chills/night sweats     Has remained afebrile (99.2 in the ER)     WBC are wnl     BC drawn 3/13, negative x2 so far (<24hrs)     COVID negative     Respiratory panel negative  3. Worsened CM     LVEF down to 30-35% from 45-50%     RV failure as well     Grade II DD also     Volume management with dialysis     No RV pacing by last remote     ? Etiology     EKG is RBBB (old)       4.  H/o VF arrest     ICD with RV lead failure     Planned for lead extraction and new implant today with Dr. Lovena Le   5. ESRF on HD 6. Anemia of chronic kidney disease     As  per IM, nephrology    Dr. Lovena Le has seen the patient this Am   For questions or updates, please contact [facility] Please consult [email or web address] for contact info under   Signed, Baldwin Jamaica, PA-C  01/10/2020, 8:29 AM    EP Attending  Patient seen and examined. Agree with above. The patient presents for ICD lead removal and insertion of a new ICD lead. His current medtronic [phone] lead has malfunction. The R waves were less than 2. Pacing threshold was elevated. I have reviewed the indications/risks/benefits/goals/expectations of the procedure and he wishes to proceed.  Mikle Bosworth.D.

## 2020-01-10 NOTE — Progress Notes (Signed)
ANTICOAGULATION CONSULT NOTE - Follow Up Consult  Pharmacy Consult for Heparin  Indication: chest pain/ACS  No Known Allergies  Patient Measurements: Height: 5\' 9"  (175.3 cm) Weight: 166 lb 14.2 oz (75.7 kg) IBW/kg (Calculated) : 70.7  Total body weight: 75.7 kg  Vital Signs: Temp: 98 F (36.7 C) (03/15 0753) Temp Source: Oral (03/15 0753) BP: 144/77 (03/15 0755) Pulse Rate: 83 (03/15 0755)  Labs: Recent Labs    01/07/20 1953 01/07/20 1953 01/07/20 2201 01/08/20 0002 01/08/20 0215 01/08/20 0753 01/08/20 1048 01/08/20 2023 01/09/20 0344 01/09/20 0816 01/09/20 1523 01/10/20 0136  HGB 8.7*   < >  --   --   --   --  7.6*  --  8.1*  --   --  8.4*  HCT 27.8*   < >  --   --   --   --  24.4*  --  25.2*  --   --  25.8*  PLT 175   < >  --   --   --   --  141*  --  156  --   --  158  LABPROT 13.9  --   --   --   --   --   --   --   --   --   --   --   INR 1.1  --   --   --   --   --   --   --   --   --   --   --   HEPARINUNFRC  --   --   --   --   --    < >  --    < > 0.23*  --  0.30 0.30  CREATININE 8.48*   < >  --   --  9.36*  --   --   --  6.43* 6.97*  --   --   TROPONINIHS 41*   < > 39* 35* 44*  --   --   --   --   --   --   --    < > = values in this interval not displayed.   Estimated Creatinine Clearance: 12.3 mL/min (A) (by C-G formula based on SCr of 6.97 mg/dL (H)).  Medical History: Past Medical History:  Diagnosis Date  . Anemia   . Atrial fibrillation   . Cardiomyopathy ([place]) 2010   Unclear Etiology: Last Echo 06/2009: EF 40-45%, severe Lateral & apical Hypokinesis (? CAD) ; Grade 2 DDysfxn. Mild conc LVH.   Marland Kitchen Cellulitis   . Chronic kidney disease (CKD), stage V ([place])    Dialysis Tue, Thurs, Sat  . Dyslipidemia   . Enterobacter sepsis ([place])   . Hypertension   . IDDM (insulin dependent diabetes mellitus) 11/03/2014  . Pneumonia   . S/P ICD (internal cardiac defibrillator) procedure 2010   VT Arrest (in [place])  . Schizophrenia ([place])   . Wegener's  disease, pulmonary ([place]) 02/05/2013  . Wegener's granulomatosis [facility])    Assessment: 54 y/o M with chest pain for heparin. He is scheduled for ICD lead revision today and plans also noted for myoview. He has a history of afib but not on anticoagulation PTA (noted with history of GIB) -heparin level at the low end of goal  Goal of Therapy:  Heparin level 0.3-0.7 units/ml Monitor platelets by anticoagulation protocol: Yes   Plan:  Increase heparin drip to 1650 units/hr Daily HL, CBC  Hildred Laser, PharmD Clinical Pharmacist **  Pharmacist phone directory can now be found on [email or web address] ([email or web address]).  Listed under [place].

## 2020-01-10 NOTE — Anesthesia Procedure Notes (Signed)
Procedure Name: Intubation Date/Time: 01/10/2020 2:42 PM Performed by: Trinna Post., CRNA Pre-anesthesia Checklist: Patient identified, Emergency Drugs available, Suction available, Patient being monitored and Timeout performed Patient Re-evaluated:Patient Re-evaluated prior to induction Oxygen Delivery Method: Circle system utilized Preoxygenation: Pre-oxygenation with 100% oxygen Induction Type: IV induction Ventilation: Mask ventilation without difficulty and Oral airway inserted - appropriate to patient size Laryngoscope Size: Mac and 4 Grade View: Grade I Tube type: Oral Tube size: 7.5 mm Number of attempts: 1 Airway Equipment and Method: Stylet Placement Confirmation: ETT inserted through vocal cords under direct vision,  positive ETCO2 and breath sounds checked- equal and bilateral Secured at: 22 cm Tube secured with: Tape Dental Injury: Teeth and Oropharynx as per pre-operative assessment

## 2020-01-10 NOTE — Progress Notes (Incomplete)
?  [facility] ?Progress Note  ? ?Subjective: Stress test and possible ACID lead extraction later today-EP following.    ? ?Objective ?Physical Exam ? ?Dialysis Access: L AVF + T/B ? ?Blood pressure (!) 144/77, pulse 83, temperature 98 ?F (36.7 ?C), temperature source Oral, resp. rate 20, height 5\' 9"  (1.753 m), weight 75.7 kg, SpO2 95 %. ? ?Results for orders placed or performed during the hospital encounter of 01/07/20 (from the past 24 hour(s))  ?Heparin level (unfractionated)     Status: None  ? Collection Time: 01/09/20  3:23 PM  ?Result Value Ref Range  ? Heparin Unfractionated 0.30 0.30 - 0.70 IU/mL  ?Prepare RBC     Status: None  ? Collection Time: 01/10/20 12:14 AM  ?Result Value Ref Range  ? Order Confirmation    ?  ORDER PROCESSED BY BLOOD BANK ?Performed at [facility], [place] [address], [place], [place] [address] ?  ?Heparin level (unfractionated)     Status: None  ? Collection Time: 01/10/20  1:36 AM  ?Result Value Ref Range  ? Heparin Unfractionated 0.30 0.30 - 0.70 IU/mL  ?CBC     Status: Abnormal  ? Collection Time: 01/10/20  1:36 AM  ?Result Value Ref Range  ? WBC 6.8 4.0 - 10.5 K/uL  ? RBC 2.85 (L) 4.22 - 5.81 MIL/uL  ? Hemoglobin 8.4 (L) 13.0 - 17.0 g/dL  ? HCT 25.8 (L) 39.0 - 52.0 %  ? MCV 90.5 80.0 - 100.0 fL  ? MCH 29.5 26.0 - 34.0 pg  ? MCHC 32.6 30.0 - 36.0 g/dL  ? RDW 13.3 11.5 - 15.5 %  ? Platelets 158 150 - 400 K/uL  ? nRBC 0.0 0.0 - 0.2 %  ?Type and screen     Status: None (Preliminary result)  ? Collection Time: 01/10/20  1:36 AM  ?Result Value Ref Range  ? ABO/RH(D) A POS   ? Antibody Screen NEG   ? Sample Expiration 01/13/2020,2359   ? Unit Number [id number]   ? Blood Component Type RED CELLS,LR   ? Unit division 00   ? Status of Unit ALLOCATED   ? Transfusion Status OK TO TRANSFUSE   ? Crossmatch Result    ?  Compatible ?Performed at [facility], [place] [address], [place], [place] [address] ?  ? Unit Number [id number]   ?  Blood Component Type RED CELLS,LR   ? Unit division 00   ? Status of Unit ALLOCATE

## 2020-01-10 NOTE — CV Procedure (Signed)
EP procedure note  Procedures performed: Extraction of a previously implanted dual coil active-fixation defibrillation lead and insertion of a new single coil active-fixation defibrillation lead.  Preoperative diagnosis: This function of the ICD lead with under sensing and elevated pacing thresholds  Postoperative diagnosis: Same as preoperative diagnosis, likely secondary to scarring at the ventricular myocardial interface  Description of the procedure: After informed consent was obtained, the patient was taken to the operating room in the fasting state.  The anesthesia service was utilized to provide general endotracheal anesthesia and invasive arterial hemodynamic monitoring.  The patient was prepped and draped in a sterile fashion.  A 6 French sheath was inserted percutaneously into the right femoral vein for central venous access.  Attention was then turned to the ICD pocket.  A 6 cm incision was carried out in the left infraclavicular region.  Electrocautery was utilized to dissect down to the ICD generator which was encased inside very dense scar tissue and removed with gentle traction.  The lead was disconnected from the defibrillator.  The defibrillator was placed in antibiotic irrigation.  Electrocautery was then utilized to dissect free the very dense fibrous scar tissue around the defibrillator lead.  The sewing sleeve was cut.  A stylette was inserted into the body of the lead and the helix retracted.  At this point the lead was cut.  A [employer] LL Z locking stylette was inserted into the body of the defibrillator lead and locked in place.  A proximal suture was placed on the proximal portion of the lead for additional control of the lead.  At this point a 40 [place] [employer] sub-C short extraction sheath was advanced into the left subclavian vein but could not traverse beyond the region of the junction of the subclavian vein and axillary vein.  There is dense fibrous scar tissue in  this region and there appeared to be an accordion effect.  A 13 French [employer]ran[employer] extraction sheath was then advanced over the defibrillator lead and again got stuck at a binding site just prior to the innominate vein.  The outer sheath was then advanced over the lead and was utilized to remove the central fibrous binding sites all the way down to the junction of the superior vena cava and right atrium.  The lead remained stuck tight.  At this point the [facility] 13 [place] extraction sheath was utilized and mostly the progress was made with the outer portion of the sheath down into the right ventricle approximately halfway past the distal coil.  Still the lead remained fixed in place.  At this point I summoned Dr. Kipp Brood to be present as I was concerned about hemodynamic compromise with additional traction placed on the lead.  The second [employer] 13 [place] extraction sheath was then advanced over the defibrillator lead and down to the lead tip and with gentle traction, pressure and counterpressure, traction and countertraction, the lead was removed in toto.  Access was then maintained in the central circulation and a 035 J-wire advanced into the right atrium.  A 9 French sheath was then advanced over the J-wire and the [employer] [phone] active-fixation defibrillation lead was advanced into the right ventricle.  Over the next 30 minutes extensive mapping in the right ventricular endocardium was carried out from the RV septum to the RV inflow tract.  The R waves typically were between 2 and 4 mV.  Twice the lead was actively fixed when the R waves measured 6 mV and after a few minutes  the R waves decreased down to 2 mV.  Additional mapping was then carried out and at the final site, the R waves measured 7 mV and the lead was actively fixed.  The threshold was less than a volt 2.5 ms and the pacing impedance was 650 ohms.  The lead was secured to the fascia with silk suture and the sewing sleeve was secured with  silk suture.  The pocket was irrigated with antibiotic irrigation.  The device was placed back in the pocket and connected to the new defibrillator lead utilizing a tie Rex pouch.  The pocket was irrigated with antibiotic irrigation.  The incision was closed with 2 layers of Vicryl suture.  The 6 French sheath was removed from the groin.  The patient had a pressure dressing applied and returned to her room in stable condition.  Complications: There were no immediate procedure complication  Conclusion: Successful extraction of a failed 54 year old dual coil active-fixation defibrillation lead and insertion of a new single coil active-fixation defibrillation lead in a patient with a nonischemic cardiomyopathy, prior VF arrest, EF 35%.  Cristopher Peru, MD

## 2020-01-10 NOTE — Progress Notes (Signed)
  Echocardiogram Echocardiogram Transesophageal has been performed.  James Nicholson 01/10/2020, 4:52 PM

## 2020-01-10 NOTE — Transfer of Care (Signed)
Immediate Anesthesia Transfer of Care Note  Patient: James Nicholson  Procedure(s) Performed: ICD LEAD REMOVAL AND IMPLANTATION OF NEW LEAD IN RIGHT VENTRICLE (N/A Chest) TRANSESOPHAGEAL ECHOCARDIOGRAM (TEE) (N/A )  Patient Location: PACU  Anesthesia Type:General  Level of Consciousness: awake, alert  and oriented  Airway & Oxygen Therapy: Patient Spontanous Breathing and Patient connected to face mask oxygen  Post-op Assessment: Report given to RN and Post -op Vital signs reviewed and stable  Post vital signs: Reviewed and stable  Last Vitals:  Vitals Value Taken Time  BP    Temp    Pulse 87 01/10/20 1749  Resp 17 01/10/20 1749  SpO2 88 % 01/10/20 1749  Vitals shown include unvalidated device data.  Last Pain:  Vitals:   01/10/20 1328  TempSrc:   PainSc: 0-No pain         Complications: No apparent anesthesia complications

## 2020-01-10 NOTE — Progress Notes (Signed)
PROGRESS NOTE    James Nicholson  QQP:[id number] DOB: 03-15-1966 DOA: 01/07/2020 PCP: [name], MD   Brief Narrative:  Patient is a 54 old [place] male with history of V. fib arrest status post ICD placement, chronic systolic heart failure, atrial fibrillation not on anticoagulation, Wegener's granulomatosis with ESRD on hemodialysis on TTS, peripheral vascular disease, type 2 diabetes mellitus, history of GI bleed, schizophrenia who presented with chest pain, shortness of breath, abdomen pain.  He speaks [place].  He reported 2 days history of left-sided chest pain associated with shortness of breath.  He follows with cardiology Dr. Lovena Le.  On presentation, he was found to have mild elevated troponin.  EKG did not show any significant change from prior.  Chest x-ray did not show any pneumonia.  Cardiology was consulted, there was concern for NSTEMI.  Started on heparin drip .  Assessment & Plan:   Principal Problem:   Chest pain Active Problems:   H/o Ventricular fibrillation Arrest now with AICD   Anemia in chronic kidney disease   ESRD on dialysis ([place])   Type II diabetes mellitus with renal manifestations (HCC)   Chronic systolic CHF (congestive heart failure) (HCC)   Chest pain: Cardiology following. Mildly elevated troponin.  Started on heparin drip.  Chest pain sounds atypical.  He denies any chest pain today. LDL of 101.  Hemoglobin A1c of 4.5 Continue Lipitor, aspirin. Undergoing nuclear stress test and ICD lead removal with insertion of new ICD lead today. Echocardiogram done on this admission showed ejection fraction of 30 to 35%, moderately decreased left ventricular function, grade 2 diastolic dysfunction.  Abdomen, nausea, vomiting: Presented with nausea, vomiting.  Continue antiemetics, PPI.  There was some mild tenderness on the epigastric region.  Lipase normal.  The symptoms have been much better today.  History of V. fib arrest: Status post ICD placement.   Follows with Dr. Lovena Le.He was planned for defillation lead replacement .Apparently the defibrillator was not functioning well and was beeping every four at home as per brother.  Paroxysmal A. Fib:Not  On anticoagulation.  Currently rate is controlled and he is in NSR.  Continue metoprolol  ESRD on dialysis: Secondary to Wegener's granulomatosis.  Dialyzed on TTS schedule.  Nephrology following.  He has AV graft on the right forearm  Chronic systolic heart failure: Currently euvolemic.  Fluid management as per dialysis.echo pending  Normocytic anemia: Associated with CKD.  Continue to monitor CBC  Type 2 diabetes mellitus: Controlled.  Continue sliding scale insulin for now            DVT prophylaxis:IV heparin Code Status: Full Family Communication: discussed with  brother on phone on 01/08/2020 Disposition Plan: Patient is from home.  Anticipate discharge to home after full work-up,waiting for nuclear stress test/lead extraction  Consultants: Cardiology  Procedures: None  Antimicrobials:  Anti-infectives (From admission, onward)   Start     Dose/Rate Route Frequency Ordered Stop   01/10/20 1420  gentamicin (GARAMYCIN) 80 mg in sodium chloride 0.9 % 500 mL irrigation       As needed 01/10/20 1421     01/10/20 1319  gentamicin (GARAMYCIN) 1.6-0.9 MG/ML-% IVPB    Note to Pharmacy: Tressia Miners Ch: cabinet override      01/10/20 1319 01/11/20 0129   01/10/20 0600  gentamicin (GARAMYCIN) 80 mg in sodium chloride 0.9 % 500 mL irrigation     80 mg Irrigation To Short Stay 01/10/20 0014 01/11/20 0600   01/10/20 0015  ceFAZolin (ANCEF) IVPB  2g/100 mL premix     2 g 200 mL/hr over 30 Minutes Intravenous On call 01/10/20 0014 01/10/20 1514   01/08/20 0000  cefTRIAXone (ROCEPHIN) 1 g in sodium chloride 0.9 % 100 mL IVPB  Status:  Discontinued     1 g 200 mL/hr over 30 Minutes Intravenous Every 24 hours 01/07/20 2358 01/08/20 0005   01/08/20 0000  azithromycin (ZITHROMAX) 500 mg  in sodium chloride 0.9 % 250 mL IVPB  Status:  Discontinued     500 mg 250 mL/hr over 60 Minutes Intravenous Every 24 hours 01/07/20 2358 01/08/20 0005      Subjective: Patient seen and examined the bedside this morning. Discharge.  No complaints of chest pain, nausea or vomiting.  Objective: Vitals:   01/10/20 1133 01/10/20 1134 01/10/20 1135 01/10/20 1337  BP:  (!) 153/84    Pulse:      Resp: (!) 23 (!) 25 (!) 24   Temp:      TempSrc:      SpO2:      Weight:    75.7 kg  Height:    5\' 9"  (1.753 m)   No intake or output data in the 24 hours ending 01/10/20 1535 Filed Weights   01/08/20 1025 01/08/20 1437 01/10/20 1337  Weight: 77.6 kg 75.7 kg 75.7 kg    Examination:  General exam: Comfortable Respiratory system: Bilateral equal air entry, normal vesicular breath sounds, no wheezes or crackles  Cardiovascular system: S1 & S2 heard, RRR. No JVD, murmurs, rubs, gallops or clicks. Gastrointestinal system: Abdomen is nondistended, soft and nontender. No organomegaly or masses felt. Normal bowel sounds heard. Central nervous system: Alert and oriented. No focal neurological deficits. Extremities: No edema, no clubbing ,no cyanosis Skin: No rashes, lesions or ulcers,no icterus ,no pallor   Data Reviewed: I have personally reviewed following labs and imaging studies  CBC: Recent Labs  Lab 01/07/20 1953 01/08/20 1048 01/09/20 0344 01/10/20 0136  WBC 6.2 5.5 7.3 6.8  NEUTROABS  --   --  4.2  --   HGB 8.7* 7.6* 8.1* 8.4*  HCT 27.8* 24.4* 25.2* 25.8*  MCV 93.9 94.9 91.0 90.5  PLT 175 141* 156 448   Basic Metabolic Panel: Recent Labs  Lab 01/07/20 1953 01/08/20 0215 01/09/20 0344 01/09/20 0816  NA 141 141 135 134*  K 4.5 4.3 3.8 3.7  CL 99 101 93* 95*  CO2 27 24 28 26   GLUCOSE 103* 102* 95 97  BUN 34* 46* 21* 24*  CREATININE 8.48* 9.36* 6.43* 6.97*  CALCIUM 8.7* 8.5* 7.9* 8.0*  PHOS  --   --   --  4.6   GFR: Estimated Creatinine Clearance: 12.3 mL/min (A)  (by C-G formula based on SCr of 6.97 mg/dL (H)). Liver Function Tests: Recent Labs  Lab 01/09/20 0816  ALBUMIN 2.8*   Recent Labs  Lab 01/08/20 0002  LIPASE 35   No results for input(s): AMMONIA in the last 168 hours. Coagulation Profile: Recent Labs  Lab 01/07/20 1953  INR 1.1   Cardiac Enzymes: No results for input(s): CKTOTAL, CKMB, CKMBINDEX, TROPONINI in the last 168 hours. BNP (last 3 results) No results for input(s): PROBNP in the last 8760 hours. HbA1C: Recent Labs    01/08/20 0335  HGBA1C 4.5*   CBG: No results for input(s): GLUCAP in the last 168 hours. Lipid Profile: Recent Labs    01/08/20 0335  CHOL 142  HDL 25*  LDLCALC 101*  TRIG 80  CHOLHDL 5.7  Thyroid Function Tests: Recent Labs    01/08/20 0335  TSH 1.912   Anemia Panel: No results for input(s): VITAMINB12, FOLATE, FERRITIN, TIBC, IRON, RETICCTPCT in the last 72 hours. Sepsis Labs: Recent Labs  Lab 01/08/20 0002  PROCALCITON 0.45    Recent Results (from the past 240 hour(s))  SARS CORONAVIRUS 2 (TAT 6-24 HRS) Nasopharyngeal Nasopharyngeal Swab     Status: None   Collection Time: 01/07/20  8:40 AM   Specimen: Nasopharyngeal Swab  Result Value Ref Range Status   SARS Coronavirus 2 NEGATIVE NEGATIVE Final    Comment: (NOTE) SARS-CoV-2 target nucleic acids are NOT DETECTED. The SARS-CoV-2 RNA is generally detectable in upper and lower respiratory specimens during the acute phase of infection. Negative results do not preclude SARS-CoV-2 infection, do not rule out co-infections with other pathogens, and should not be used as the sole basis for treatment or other patient management decisions. Negative results must be combined with clinical observations, patient history, and epidemiological information. The expected result is Negative. Fact Sheet for Patients: SugarRoll.be Fact Sheet for Healthcare  Providers: https://www.woods-mathews.com/ This test is not yet approved or cleared by the [place] FDA and  has been authorized for detection and/or diagnosis of SARS-CoV-2 by FDA under an Emergency Use Authorization (EUA). This EUA will remain  in effect (meaning this test can be used) for the duration of the COVID-19 declaration under Section 56 4(b)(1) of the Act, 21 U.S.C. section 360bbb-3(b)(1), unless the authorization is terminated or revoked sooner. Performed at [facility], [place] [address], [place], [place] [address]   SARS CORONAVIRUS 2 (TAT 6-24 HRS) Nasopharyngeal Nasopharyngeal Swab     Status: None   Collection Time: 01/07/20 11:51 PM   Specimen: Nasopharyngeal Swab  Result Value Ref Range Status   SARS Coronavirus 2 NEGATIVE NEGATIVE Final    Comment: (NOTE) SARS-CoV-2 target nucleic acids are NOT DETECTED. The SARS-CoV-2 RNA is generally detectable in upper and lower respiratory specimens during the acute phase of infection. Negative results do not preclude SARS-CoV-2 infection, do not rule out co-infections with other pathogens, and should not be used as the sole basis for treatment or other patient management decisions. Negative results must be combined with clinical observations, patient history, and epidemiological information. The expected result is Negative. Fact Sheet for Patients: SugarRoll.be Fact Sheet for Healthcare Providers: https://www.woods-mathews.com/ This test is not yet approved or cleared by the [place] FDA and  has been authorized for detection and/or diagnosis of SARS-CoV-2 by FDA under an Emergency Use Authorization (EUA). This EUA will remain  in effect (meaning this test can be used) for the duration of the COVID-19 declaration under Section 56 4(b)(1) of the Act, 21 U.S.C. section 360bbb-3(b)(1), unless the authorization is terminated or revoked sooner. Performed at  [name], [place] [address], [place], [place] [address]   Respiratory Panel by PCR     Status: None   Collection Time: 01/08/20 12:14 AM   Specimen: Nasopharyngeal Swab; Respiratory  Result Value Ref Range Status   Adenovirus NOT DETECTED NOT DETECTED Final   Coronavirus 229E NOT DETECTED NOT DETECTED Final    Comment: (NOTE) The Coronavirus on the Respiratory Panel, DOES NOT test for the novel  Coronavirus (2019 nCoV)    Coronavirus HKU1 NOT DETECTED NOT DETECTED Final   Coronavirus NL63 NOT DETECTED NOT DETECTED Final   Coronavirus OC43 NOT DETECTED NOT DETECTED Final   Metapneumovirus NOT DETECTED NOT DETECTED Final   Rhinovirus / Enterovirus NOT  DETECTED NOT DETECTED Final   Influenza A NOT DETECTED NOT DETECTED Final   Influenza B NOT DETECTED NOT DETECTED Final   Parainfluenza Virus 1 NOT DETECTED NOT DETECTED Final   Parainfluenza Virus 2 NOT DETECTED NOT DETECTED Final   Parainfluenza Virus 3 NOT DETECTED NOT DETECTED Final   Parainfluenza Virus 4 NOT DETECTED NOT DETECTED Final   Respiratory Syncytial Virus NOT DETECTED NOT DETECTED Final   Bordetella pertussis NOT DETECTED NOT DETECTED Final   Chlamydophila pneumoniae NOT DETECTED NOT DETECTED Final   Mycoplasma pneumoniae NOT DETECTED NOT DETECTED Final    Comment: Performed at [facility], [place] [address] [place], [place] [address]  Culture, blood (Routine X 2) w Reflex to ID Panel     Status: None (Preliminary result)   Collection Time: 01/08/20  4:34 PM   Specimen: BLOOD LEFT HAND  Result Value Ref Range Status   Specimen Description BLOOD LEFT HAND  Final   Special Requests   Final    AEROBIC BOTTLE ONLY Blood Culture adequate volume Performed at [facility], [place] [address] [place], [place] [address]    Culture NO GROWTH 2 DAYS  Final   Report Status PENDING  Incomplete  Culture, blood (Routine X 2) w Reflex to ID Panel     Status: None (Preliminary result)   Collection Time: 01/08/20   4:39 PM   Specimen: BLOOD RIGHT HAND  Result Value Ref Range Status   Specimen Description BLOOD RIGHT HAND  Final   Special Requests   Final    AEROBIC BOTTLE ONLY Blood Culture results may not be optimal due to an inadequate volume of blood received in culture bottles Performed at [facility], [place] [address], [place], [place] [address]    Culture NO GROWTH 2 DAYS  Final   Report Status PENDING  Incomplete         Radiology Studies: NM Myocar Multi W/Spect W/Wall Motion / EF  Result Date: 01/10/2020 CLINICAL DATA:  Chest pain EXAM: MYOCARDIAL IMAGING WITH SPECT (REST AND PHARMACOLOGIC-STRESS) GATED LEFT VENTRICULAR WALL MOTION STUDY LEFT VENTRICULAR EJECTION FRACTION TECHNIQUE: Standard myocardial SPECT imaging was performed after resting intravenous injection of 10 mCi Tc-93m tetrofosmin. Subsequently, intravenous infusion of Lexiscan was performed under the supervision of the Cardiology staff. At peak effect of the drug, 30 mCi Tc-47m tetrofosmin was injected intravenously and standard myocardial SPECT imaging was performed. Quantitative gated imaging was also performed to evaluate left ventricular wall motion, and estimate left ventricular ejection fraction. COMPARISON:  None. FINDINGS: Perfusion: Single large fixed defect along the anterolateral wall of the left ventricle. No reversible ischemia. Wall Motion: Mild global hypokinesis. Associated left ventricular dilation. Left Ventricular Ejection Fraction: 45 % End diastolic volume 321 ml End systolic volume 224 ml IMPRESSION: 1. Large fixed defect along the anterolateral wall the left ventricle, raising concern for prior infarction. No reversible ischemia. 2. Mild global hypokinesis with associated left ventricular dilatation. 3. Left ventricular ejection fraction 45% 4. Non invasive risk stratification*: High *2012 Appropriate Use Criteria for Coronary Revascularization Focused Update: J Am Coll Cardiol. 8250;03(7):048-889.  http://content.airportbarriers.com.aspx?articleid=[email or web address] Electronically Signed   By: Julian Hy M.D.   On: 01/10/2020 14:01   ECHOCARDIOGRAM COMPLETE  Result Date: 01/09/2020    ECHOCARDIOGRAM REPORT   Patient Name:   James Nicholson Date of Exam: 01/09/2020 Medical Rec #:  [id number]        Height:       69.0 in Accession #:  [id number]       Weight:       166.9 lb Date of Birth:  11/27/1965       BSA:          1.913 m Patient Age:    79 years         BP:           116/76 mmHg Patient Gender: M                HR:           84 bpm. Exam Location:  Inpatient Procedure: 2D Echo Indications:    chest pain 786.50  History:        Patient has prior history of Echocardiogram examinations, most                 recent 12/23/2013. CHF, Defibrillator, end stage renal disease;                 Risk Factors:Diabetes, Hypertension and Dyslipidemia.  Sonographer:    Johny Chess Referring Phys: [id number] Graci Hulce IMPRESSIONS  1. Severe biventricular failure.  2. Left ventricular ejection fraction, by estimation, is 30 to 35%. The left ventricle has moderately decreased function. The left ventricle has no regional wall motion abnormalities. The left ventricular internal cavity size was moderately dilated. Left ventricular diastolic parameters are consistent with Grade II diastolic dysfunction (pseudonormalization). Elevated left atrial pressure.  3. Right ventricular systolic function is severely reduced. The right ventricular size is severely enlarged. There is mildly elevated pulmonary artery systolic pressure. The estimated right ventricular systolic pressure is 73.4 mmHg.  4. Left atrial size was moderately dilated.  5. Right atrial size was moderately dilated.  6. The mitral valve is normal in structure. Mild mitral valve regurgitation. No evidence of mitral stenosis.  7. The aortic valve is normal in structure. Aortic valve regurgitation is not visualized. No aortic stenosis is present.  8. The  inferior vena cava is normal in size with greater than 50% respiratory variability, suggesting right atrial pressure of 3 mmHg. FINDINGS  Left Ventricle: Left ventricular ejection fraction, by estimation, is 30 to 35%. The left ventricle has moderately decreased function. The left ventricle has no regional wall motion abnormalities. The left ventricular internal cavity size was moderately  dilated. There is no left ventricular hypertrophy. Left ventricular diastolic parameters are consistent with Grade II diastolic dysfunction (pseudonormalization). Elevated left atrial pressure. Right Ventricle: The right ventricular size is severely enlarged. No increase in right ventricular wall thickness. Right ventricular systolic function is severely reduced. There is mildly elevated pulmonary artery systolic pressure. The tricuspid regurgitant velocity is 2.63 m/s, and with an assumed right atrial pressure of 3 mmHg, the estimated right ventricular systolic pressure is 19.3 mmHg. Left Atrium: Left atrial size was moderately dilated. Right Atrium: Right atrial size was moderately dilated. Pericardium: There is no evidence of pericardial effusion. Mitral Valve: The mitral valve is normal in structure. Normal mobility of the mitral valve leaflets. Mild mitral valve regurgitation. No evidence of mitral valve stenosis. Tricuspid Valve: The tricuspid valve is normal in structure. Tricuspid valve regurgitation is mild . No evidence of tricuspid stenosis. Aortic Valve: The aortic valve is normal in structure. Aortic valve regurgitation is not visualized. No aortic stenosis is present. Pulmonic Valve: The pulmonic valve was normal in structure. Pulmonic valve regurgitation is not visualized. No evidence of pulmonic stenosis. Aorta: The aortic root is normal in size and structure. Venous: The inferior vena cava  is normal in size with greater than 50% respiratory variability, suggesting right atrial pressure of 3 mmHg. IAS/Shunts: No  atrial level shunt detected by color flow Doppler.  LEFT VENTRICLE PLAX 2D LVIDd:         5.90 cm  Diastology LVIDs:         4.70 cm  LV e' lateral:   7.29 cm/s LV PW:         1.10 cm  LV E/e' lateral: 12.5 LV IVS:        0.90 cm  LV e' medial:    4.68 cm/s LVOT diam:     2.30 cm  LV E/e' medial:  19.4 LV SV:         69 LV SV Index:   36 LVOT Area:     4.15 cm  RIGHT VENTRICLE RV S prime:     12.50 cm/s TAPSE (M-mode): 2.1 cm LEFT ATRIUM             Index       RIGHT ATRIUM           Index LA diam:        4.50 cm 2.35 cm/m  RA Area:     20.00 cm LA Vol (A2C):   55.4 ml 28.96 ml/m RA Volume:   61.00 ml  31.89 ml/m LA Vol (A4C):   57.7 ml 30.16 ml/m LA Biplane Vol: 62.9 ml 32.88 ml/m  AORTIC VALVE LVOT Vmax:   89.40 cm/s LVOT Vmean:  57.700 cm/s LVOT VTI:    0.166 m  AORTA Ao Root diam: 3.20 cm MITRAL VALVE               TRICUSPID VALVE MV Area (PHT): 4.89 cm    TR Peak grad:   27.7 mmHg MV Decel Time: 155 msec    TR Vmax:        263.00 cm/s MV E velocity: 90.80 cm/s MV A velocity: 48.80 cm/s  SHUNTS MV E/A ratio:  1.86        Systemic VTI:  0.17 m                            Systemic Diam: 2.30 cm Ena Dawley MD Electronically signed by Ena Dawley MD Signature Date/Time: 01/09/2020/1:19:40 PM    Final    HYBRID OR IMAGING ([place])  Result Date: 01/10/2020 There is no interpretation for this exam.  This order is for images obtained during a surgical procedure.  Please See "Surgeries" Tab for more information regarding the procedure.        Scheduled Meds: . [MAR Hold] sodium chloride   Intravenous Once  . sodium chloride   Intravenous Once  . [MAR Hold] atorvastatin  40 mg Oral Daily  . [MAR Hold] Chlorhexidine Gluconate Cloth  6 each Topical Q0600  . [MAR Hold] darbepoetin (ARANESP) injection - DIALYSIS  100 mcg Intravenous Q Sat-HD  . [MAR Hold] doxercalciferol  4 mcg Intravenous Q T,Th,Sa-HD  . [MAR Hold] ferric citrate  420 mg Oral TID WC  . gentamicin irrigation  80 mg  Irrigation To SSTC  . [MAR Hold] metoprolol succinate  50 mg Oral Daily  . ondansetron      . [MAR Hold] pantoprazole (PROTONIX) IV  40 mg Intravenous Q24H  . regadenoson      . [MAR Hold] sodium chloride flush  3 mL Intravenous Once   Continuous Infusions: . [MAR Hold] sodium chloride    . [  MAR Hold] sodium chloride    . sodium chloride    . sodium chloride 10 mL/hr at 01/10/20 1351  . gentamicin    . heparin    . heparin       LOS: 2 days    Time spent:35 mins. More than 50% of that time was spent in counseling and/or coordination of care.      Shelly Coss, MD [facility] P3/15/2021, 3:35 PM

## 2020-01-10 NOTE — Anesthesia Procedure Notes (Signed)
Arterial Line Insertion Start/End3/15/2021 2:15 PM Performed by: Wilburn Cornelia, CRNA, CRNA  Patient location: Pre-op. Preanesthetic checklist: patient identified, IV checked, site marked, risks and benefits discussed, surgical consent, monitors and equipment checked, pre-op evaluation, timeout performed and anesthesia consent Lidocaine 1% used for infiltration and patient sedated Left, radial was placed Catheter size: 20 G Hand hygiene performed  and maximum sterile barriers used   Attempts: 3 Procedure performed without using ultrasound guided technique. Following insertion, dressing applied and Biopatch. Post procedure assessment: normal  Patient tolerated the procedure well with no immediate complications.

## 2020-01-11 ENCOUNTER — Encounter: Payer: Self-pay | Admitting: *Deleted

## 2020-01-11 ENCOUNTER — Inpatient Hospital Stay (HOSPITAL_COMMUNITY): Payer: Medicare Other

## 2020-01-11 DIAGNOSIS — I259 Chronic ischemic heart disease, unspecified: Secondary | ICD-10-CM

## 2020-01-11 LAB — CBC
HCT: 26.6 % — ABNORMAL LOW (ref 39.0–52.0)
Hemoglobin: 8.4 g/dL — ABNORMAL LOW (ref 13.0–17.0)
MCH: 29.4 pg (ref 26.0–34.0)
MCHC: 31.6 g/dL (ref 30.0–36.0)
MCV: 93 fL (ref 80.0–100.0)
Platelets: 219 10*3/uL (ref 150–400)
RBC: 2.86 MIL/uL — ABNORMAL LOW (ref 4.22–5.81)
RDW: 13.5 % (ref 11.5–15.5)
WBC: 8.5 10*3/uL (ref 4.0–10.5)
nRBC: 0 % (ref 0.0–0.2)

## 2020-01-11 LAB — RENAL FUNCTION PANEL
Albumin: 2.6 g/dL — ABNORMAL LOW (ref 3.5–5.0)
Anion gap: 17 — ABNORMAL HIGH (ref 5–15)
BUN: 53 mg/dL — ABNORMAL HIGH (ref 6–20)
CO2: 23 mmol/L (ref 22–32)
Calcium: 8.4 mg/dL — ABNORMAL LOW (ref 8.9–10.3)
Chloride: 95 mmol/L — ABNORMAL LOW (ref 98–111)
Creatinine, Ser: 12.52 mg/dL — ABNORMAL HIGH (ref 0.61–1.24)
GFR calc Af Amer: 5 mL/min — ABNORMAL LOW (ref >60)
GFR calc non Af Amer: 4 mL/min — ABNORMAL LOW (ref >60)
Glucose, Bld: 107 mg/dL — ABNORMAL HIGH (ref 70–99)
Phosphorus: 5.3 mg/dL — ABNORMAL HIGH (ref 2.5–4.6)
Potassium: 5.1 mmol/L (ref 3.5–5.1)
Sodium: 135 mmol/L (ref 135–145)

## 2020-01-11 MED ORDER — SODIUM CHLORIDE 0.9 % IV SOLN: INTRAVENOUS | Status: DC

## 2020-01-11 MED FILL — Gentamicin Sulfate Inj 40 MG/ML: INTRAMUSCULAR | Qty: 80 | Status: AC

## 2020-01-11 NOTE — Procedures (Signed)
I was present at this dialysis session. I have reviewed the session itself and made appropriate changes.   ICD lead extracted yesterday, reimplanted. Nuc ST yesterday fixed large anterolateral defect, nothing reversible  HD UF goal 2L.  RFA AVF Qb 400. 2K bath, am labs pending.    Filed Weights   01/08/20 1437 01/10/20 1337 01/11/20 0754  Weight: 75.7 kg 75.7 kg 70.5 kg    Recent Labs  Lab 01/09/20 0816  NA 134*  K 3.7  CL 95*  CO2 26  GLUCOSE 97  BUN 24*  CREATININE 6.97*  CALCIUM 8.0*  PHOS 4.6    Recent Labs  Lab 01/09/20 0344 01/10/20 0136 01/11/20 0452  WBC 7.3 6.8 8.5  NEUTROABS 4.2  --   --   HGB 8.1* 8.4* 8.4*  HCT 25.2* 25.8* 26.6*  MCV 91.0 90.5 93.0  PLT 156 158 219    Scheduled Meds: . sodium chloride   Intravenous Once  . sodium chloride   Intravenous Once  . atorvastatin  40 mg Oral Daily  . Chlorhexidine Gluconate Cloth  6 each Topical Q0600  . darbepoetin (ARANESP) injection - DIALYSIS  100 mcg Intravenous Q Sat-HD  . doxercalciferol  4 mcg Intravenous Q T,Th,Sa-HD  . ferric citrate  420 mg Oral TID WC  . metoprolol succinate  50 mg Oral Daily  . pantoprazole (PROTONIX) IV  40 mg Intravenous Q24H  . sodium chloride flush  3 mL Intravenous Once   Continuous Infusions: . sodium chloride    . sodium chloride    .  ceFAZolin (ANCEF) IV 1 g (01/11/20 0211)   PRN Meds:.sodium chloride, sodium chloride, acetaminophen, acetaminophen, lidocaine (PF), lidocaine-prilocaine, ondansetron (ZOFRAN) IV, pentafluoroprop-tetrafluoroeth, promethazine   Pearson Grippe  MD 01/11/2020, 8:33 AM

## 2020-01-11 NOTE — Anesthesia Postprocedure Evaluation (Signed)
Anesthesia Post Note  Patient: Hughey Rout  Procedure(s) Performed: ICD LEAD REMOVAL AND IMPLANTATION OF NEW LEAD IN RIGHT VENTRICLE (N/A Chest) TRANSESOPHAGEAL ECHOCARDIOGRAM (TEE) (N/A )     Patient location during evaluation: PACU Anesthesia Type: General Level of consciousness: awake and alert Pain management: pain level controlled Vital Signs Assessment: post-procedure vital signs reviewed and stable Respiratory status: spontaneous breathing, nonlabored ventilation, respiratory function stable and patient connected to nasal cannula oxygen Cardiovascular status: blood pressure returned to baseline and stable Postop Assessment: no apparent nausea or vomiting Anesthetic complications: no    Last Vitals:  Vitals:   01/11/20 0800 01/11/20 0830  BP: (!) 171/73 (!) 169/75  Pulse: 81 84  Resp: 16 17  Temp:    SpO2:      Last Pain:  Vitals:   01/11/20 0754  TempSrc: Oral  PainSc: 0-No pain                 Magda Muise S

## 2020-01-11 NOTE — Progress Notes (Incomplete)
?  [facility] ?Progress Note  ? ?Subjective: Stress test and ACID lead extraction l ? ?Objective ?Physical Exam ? ?Dialysis Access: L AVF + T/B ? ?Blood pressure (!) 144/77, pulse 83, temperature 98 ?F (36.7 ?C), temperature source Oral, resp. rate 20, height 5\' 9"  (1.753 m), weight 75.7 kg, SpO2 95 %. ? ?Results for orders placed or performed during the hospital encounter of 01/07/20 (from the past 24 hour(s))  ?Surgical pcr screen     Status: None  ? Collection Time: 01/10/20  1:41 PM  ? Specimen: Nasal Mucosa; Nasal Swab  ?Result Value Ref Range  ? MRSA, PCR NEGATIVE NEGATIVE  ? Staphylococcus aureus NEGATIVE NEGATIVE  ?Prepare RBC     Status: None  ? Collection Time: 01/10/20  3:23 PM  ?Result Value Ref Range  ? Order Confirmation    ?  ORDER PROCESSED BY BLOOD BANK ?Performed at [facility], [place] [address], [place], [place] [address] ?  ?Glucose, capillary     Status: None  ? Collection Time: 01/10/20  5:49 PM  ?Result Value Ref Range  ? Glucose-Capillary 87 70 - 99 mg/dL  ?CBC     Status: Abnormal  ? Collection Time: 01/11/20  4:52 AM  ?Result Value Ref Range  ? WBC 8.5 4.0 - 10.5 K/uL  ? RBC 2.86 (L) 4.22 - 5.81 MIL/uL  ? Hemoglobin 8.4 (L) 13.0 - 17.0 g/dL  ? HCT 26.6 (L) 39.0 - 52.0 %  ? MCV 93.0 80.0 - 100.0 fL  ? MCH 29.4 26.0 - 34.0 pg  ? MCHC 31.6 30.0 - 36.0 g/dL  ? RDW 13.5 11.5 - 15.5 %  ? Platelets 219 150 - 400 K/uL  ? nRBC 0.0 0.0 - 0.2 %  ? ?Medications: ?? sodium chloride    ?? sodium chloride    ??  ceFAZolin (ANCEF) IV 1 g (01/11/20 0211)  ? ?? sodium chloride   Intravenous Once  ?? sodium chloride   Intravenous Once  ?? atorvastatin  40 mg Oral Daily  ?? Chlorhexidine Gluconate Cloth  6 each Topical Q0600  ?? darbepoetin (ARANESP) injection - DIALYSIS  100 mcg Intravenous Q Sat-HD  ?? doxercalciferol  4 mcg Intravenous Q T,Th,Sa-HD  ?? ferric citrate  420 mg Oral TID WC  ?? metoprolol succinate  50 mg Oral Daily  ?? pantoprazole (PROTONIX) IV  40 mg Intravenous Q24H  ? ? sodium chloride flush  3 mL Intravenous Once  ? ?D

## 2020-01-11 NOTE — Progress Notes (Signed)
Foley catheter removed per protocol, post-op day 1, without difficulty.  Will continue to monitor.

## 2020-01-11 NOTE — Discharge Instructions (Signed)
R groin post procedure instructions No lifting greater then 5 pounds for 1 week Keep procedure site clean & dry. If you notice increased pain, swelling, bleeding or pus, call/return! no soaking baths/hot tubs/pools for 1 week.   PLEASE also not instructions below         Supplemental Discharge Instructions for  Pacemaker/Defibrillator Patients  Activity No heavy lifting or vigorous activity with your left/right arm for 6 to 8 weeks.  Do not raise your left/right arm above your head for one week.  Gradually raise your affected arm as drawn below.             01/24/2020                 01/15/2020                01/16/2020              01/17/2020 __  NO DRIVING for 1 week  ; you may begin driving on   1/60/1093  .  WOUND CARE - Keep the wound area clean and dry.  Do not get this area wet for one week. No showers for one week; you may shower on 01/17/2020    . - The tape/steri-strips on your wound will fall off; do not pull them off.  No bandage is needed on the site.  DO  NOT apply any creams, oils, or ointments to the wound area. - If you notice any drainage or discharge from the wound, any swelling or bruising at the site, or you develop a fever > 101? F after you are discharged home, call the office at once.  Special Instructions - You are still able to use cellular telephones; use the ear opposite the side where you have your pacemaker/defibrillator.  Avoid carrying your cellular phone near your device. - When traveling through airports, show security personnel your identification card to avoid being screened in the metal detectors.  Ask the security personnel to use the hand wand. - Avoid arc welding equipment, MRI testing (magnetic resonance imaging), TENS units (transcutaneous nerve stimulators).  Call the office for questions about other devices. - Avoid electrical appliances that are in poor condition or are not properly grounded. - Microwave ovens are safe to be near or to  operate.  Additional information for defibrillator patients should your device go off: - If your device goes off ONCE and you feel fine afterward, notify the device clinic nurses. - If your device goes off ONCE and you do not feel well afterward, call 911. - If your device goes off TWICE, call 911. - If your device goes off THREE times in one day, call 911.  DO NOT DRIVE YOURSELF OR A FAMILY MEMBER WITH A DEFIBRILLATOR TO THE HOSPITAL--CALL 911.

## 2020-01-11 NOTE — Progress Notes (Signed)
Left radial ART line removed per MD order without difficulty.  Pressure held and pressure dressing applied.  Pt immediately taken to HD.  HD nurse called and made aware that ART line had just been pulled.

## 2020-01-11 NOTE — Progress Notes (Signed)
Progress Note  Patient Name: James Nicholson Date of Encounter: 01/11/2020  Primary Cardiologist: Dr. Lovena Le   Today's visit is aided by [employer] [place] translator, Maklidone# [id number]  Subjective   Dry cough intermittently, not SOB.  CP is better, minimal discomfort at procedure site  Inpatient Medications    Scheduled Meds: . sodium chloride   Intravenous Once  . sodium chloride   Intravenous Once  . atorvastatin  40 mg Oral Daily  . Chlorhexidine Gluconate Cloth  6 each Topical Q0600  . darbepoetin (ARANESP) injection - DIALYSIS  100 mcg Intravenous Q Sat-HD  . doxercalciferol  4 mcg Intravenous Q T,Th,Sa-HD  . ferric citrate  420 mg Oral TID WC  . metoprolol succinate  50 mg Oral Daily  . pantoprazole (PROTONIX) IV  40 mg Intravenous Q24H  . sodium chloride flush  3 mL Intravenous Once   Continuous Infusions: . sodium chloride    . sodium chloride    .  ceFAZolin (ANCEF) IV 1 g (01/11/20 0211)   PRN Meds: sodium chloride, sodium chloride, acetaminophen, acetaminophen, lidocaine (PF), lidocaine-prilocaine, ondansetron (ZOFRAN) IV, pentafluoroprop-tetrafluoroeth, promethazine   Vital Signs    Vitals:   01/11/20 0930 01/11/20 1000 01/11/20 1030 01/11/20 1100  BP: (!) 152/65 (!) 156/67 (!) 152/63 (!) 153/61  Pulse: 85 84 86 87  Resp:   (!) 23 20  Temp:      TempSrc:      SpO2:      Weight:      Height:        Intake/Output Summary (Last 24 hours) at 01/11/2020 1118 Last data filed at 01/11/2020 0647 Gross per 24 hour  Intake 655 ml  Output 650 ml  Net 5 ml   Last 3 Weights 01/11/2020 01/10/2020 01/08/2020  Weight (lbs) 155 lb 6.8 oz 166 lb 14.2 oz 166 lb 14.2 oz  Weight (kg) 70.5 kg 75.7 kg 75.7 kg      Telemetry    SR - Personally Reviewed  ECG    SR, RBBB - Personally Reviewed  Physical Exam   GEN: No acute distress.   Neck: No JVD Cardiac: RRR, no murmurs, rubs, or gallops.  Respiratory: CTA b/l. GI: Soft, nontender, non-distended  MS: No  edema; No deformity. Neuro:  Nonfocal  Psych: Normal affect   L chest: site is stable, no hematoma, no bleeding R groin is stable, is soft, no hematoma, nontender  Labs    High Sensitivity Troponin:   Recent Labs  Lab 01/07/20 1953 01/07/20 2201 01/08/20 0002 01/08/20 0215  TROPONINIHS 41* 39* 35* 44*      Chemistry Recent Labs  Lab 01/09/20 0344 01/09/20 0816 01/11/20 0819  NA 135 134* 135  K 3.8 3.7 5.1  CL 93* 95* 95*  CO2 28 26 23   GLUCOSE 95 97 107*  BUN 21* 24* 53*  CREATININE 6.43* 6.97* 12.52*  CALCIUM 7.9* 8.0* 8.4*  ALBUMIN  --  2.8* 2.6*  GFRNONAA 9* 8* 4*  GFRAA 10* 9* 5*  ANIONGAP 14 13 17*     Hematology Recent Labs  Lab 01/09/20 0344 01/10/20 0136 01/11/20 0452  WBC 7.3 6.8 8.5  RBC 2.77* 2.85* 2.86*  HGB 8.1* 8.4* 8.4*  HCT 25.2* 25.8* 26.6*  MCV 91.0 90.5 93.0  MCH 29.2 29.5 29.4  MCHC 32.1 32.6 31.6  RDW 13.2 13.3 13.5  PLT 156 158 219    BNPNo results for input(s): BNP, PROBNP in the last 168 hours.   DDimer No results for  input(s): DDIMER in the last 168 hours.   Radiology      Cardiac Studies    01/09/2020: TTE IMPRESSIONS  1. Severe biventricular failure.  2. Left ventricular ejection fraction, by estimation, is 30 to 35%. The  left ventricle has moderately decreased function. The left ventricle has  no regional wall motion abnormalities. The left ventricular internal  cavity size was moderately dilated.  Left ventricular diastolic parameters are consistent with Grade II  diastolic dysfunction (pseudonormalization). Elevated left atrial  pressure.  3. Right ventricular systolic function is severely reduced. The right  ventricular size is severely enlarged. There is mildly elevated pulmonary  artery systolic pressure. The estimated right ventricular systolic  pressure is 97.3 mmHg.  4. Left atrial size was moderately dilated.  5. Right atrial size was moderately dilated.  6. The mitral valve is normal in  structure. Mild mitral valve  regurgitation. No evidence of mitral stenosis.  7. The aortic valve is normal in structure. Aortic valve regurgitation is  not visualized. No aortic stenosis is present.  8. The inferior vena cava is normal in size with greater than 50%  respiratory variability, suggesting right atrial pressure of 3 mmHg.    12/23/2013: TTE, LVEF 45-50%  Patient Profile     54 y.o. male w/PMHx of resuscitated VF arrest w/ICD, HTN, HLD, CM of unknown etiology, chronic CHF (systolic), schizophrenia, ESRF on HD (Tues/Thurs/Sat), Wegener's granulomatosis.  There is an unclear h/o AFib, unknown when this was.   Admitted to [facility] 01[facility]07/20 with CP, SOB, reports of difficult sleep, epigastric discomfort with an episode of vomiting the day of admission, worsening cough ER temp was 99.2 HS Trop 41 > 39, he was initially suspect to be NSTEMI started on heparin gtt and NTG gg CXR was clear Also reports of intermittent chills/night sweats for a couple weeks CP felt mostly atypical sounding, NTG gtt stopped, planned for myoview to evaluate further  The patient was already scheduled from outpatient/office to have ICD RV lead extraction and new lead implant 2/2 RV lead failure.   Device information: MDT single chamber ICD, implanted 02/09/09, secondary prevention >> gen change 12/05/2017  Assessment & Plan    1. CP     Mild abn HS trop     CP felt largely to be atypical     Trop likely 2/2 ESRF     Stress test with scar, no ischemia, EF 45%  2. Cough     Reports of some degree of chills/night sweats     Has remained afebrile (99.2 in the ER)     WBC are wnl     BC drawn 3/13, negative x2 so far (2 days)     COVID negative     Respiratory panel negative  3. Worsened CM     LVEF down to 30-35% from 45-50%,( and 45% by nuclear)     RV failure as well     Grade II DD also     Volume management with dialysis     No RV pacing by last remote     ? Etiology     EKG is RBBB (old)      He is on BB     Given ESRF not ACE/ARB candidate, will revisit afterload reduction with perhaps nitrate or hydralazine in the office  4. H/o VF arrest     ICD with RV lead failure       S/p lead extraction and new lead implant yesterday His site had  some oozing at completion of his procedure Pressure dressing was removed this Am and looked ok, though given HD and getting some  Heparin with this, placed another pressure dressing Early follow up for Friday is in place R groin is stable  CXR this AM without ptx Device check this AM with intact function  Wound care and activity instructions were reviewed with the patient (using translator)   5. ESRF on HD 6. Anemia of chronic kidney disease     As per IM, nephrology    OK for discharge from EP/procedure perspective when medically ready otherwise   For questions or updates, please contact [facility] Please consult [email or web address] for contact info under   Signed, Baldwin [place], PA-C  01/11/2020, 11:18 AM

## 2020-01-11 NOTE — Discharge Summary (Signed)
Physician Discharge Summary  James Nicholson RJJ:[id number] DOB: Feb 18, 1966 DOA: 01/07/2020  PCP: [name], MD  Admit date: 01/07/2020 Discharge date: 01/11/2020  Admitted From: Home Disposition:  Home  Discharge Condition:Stable CODE STATUS:FULL Diet recommendation: Heart Healthy   Brief/Interim Summary: Patient is a 54 old [place] male with history of V. fib arrest status post ICD placement, chronic systolic heart failure, atrial fibrillation not on anticoagulation, Wegener's granulomatosis with ESRD on hemodialysis on TTS, peripheral vascular disease, type 2 diabetes mellitus, history of GI bleed, schizophrenia who presented with chest pain, shortness of breath, abdomen pain.  He speaks [place].  He reported 2 days history of left-sided chest pain associated with shortness of breath.  He follows with cardiology Dr. Lovena Le.  On presentation, he was found to have mild elevated troponin.  EKG did not show any significant change from prior.  Chest x-ray did not show any pneumonia.  Cardiology was consulted, there was concern for NSTEMI.  Started on heparin drip. Nuclear stress test did not show any reversible ischemia.  Currently chest pain-free.  He underwent ICD RV lead extraction and new lead implant on 01/11/2020.  He will follow-up with EP as an outpatient.  Following problems were addressed during his hospitalization:  Chest pain: Cardiology following. Mildly elevated troponin.  Started on heparin drip on admission which was later discontinued. Echocardiogram done on this admission showed ejection fraction of 30 to 35%, moderately decreased left ventricular function, grade 2 diastolic dysfunction. Nuclear stress test did not show any reversible ischemia.  Currently chest pain-free  Abdomen, nausea, vomiting: Resolved  History of V. fib arrest: Status post ICD placement.  Follows with Dr. Lovena Le.He was planned for defillation lead replacement as an outpatient .Apparently the  defibrillator was not functioning well and was beeping every four at home .  He underwent ICD RV lead extraction and new lead implant on 01/11/2020.  He will follow-up with EP as an outpatient.  Paroxysmal A. Fib:Not  On anticoagulation.  Currently rate is controlled and he is in NSR.  Continue metoprolol  ESRD on dialysis: Secondary to Wegener's granulomatosis.  Dialyzed on TTS schedule.  Nephrology following.  He has AV graft on the right forearm  Chronic systolic heart failure: Currently euvolemic.  Fluid management as per dialysis.echo pending  Normocytic anemia: Associated with CKD.  Continue to monitor CBC  Type 2 diabetes mellitus: Continue home regimen  Discharge Diagnoses:  Principal Problem:   Chest pain Active Problems:   H/o Ventricular fibrillation Arrest now with AICD   Anemia in chronic kidney disease   ESRD on dialysis ([place])   Type II diabetes mellitus with renal manifestations ([facility])   Chronic systolic CHF (congestive heart failure) ([place])    Discharge Instructions  Discharge Instructions    Diet - low sodium heart healthy   Complete by: As directed    Discharge instructions   Complete by: As directed    1)Follow up at cardiology office on 01/20/20 at 11am. 2)Continue your home meds.   Increase activity slowly   Complete by: As directed      Allergies as of 01/11/2020   No Known Allergies     Medication List    TAKE these medications   atorvastatin 20 MG tablet Commonly known as: LIPITOR Take 1 tablet (20 mg total) by mouth daily.   Auryxia 1 GM 210 MG(Fe) tablet Generic drug: ferric citrate Take 420 mg by mouth 3 (three) times daily.   calcium acetate 667 MG capsule Commonly known as: PHOSLO  Take 2 capsules (1,334 mg total) by mouth 3 (three) times daily with meals. 8a, 12p, 5p   metoprolol succinate 50 MG 24 hr tablet Commonly known as: TOPROL-XL Take 1 tablet (50 mg total) by mouth daily.      Follow-up Information    [facility] Follow up.   Specialty: Cardiology Why: 01/14/2020 @ 2:00PM, dressing removal, wound check visit 01/20/2020 @ 11:00AM, wound check visit Contact information: [address], [address] [place]       Evans Lance, MD Follow up.   Specialty: Cardiology Why: 04/25/2020 @ 2:00PM Contact information: [address] [place] [address] [phone]          No Known Allergies  Consultations:  Cardiology   Procedures/Studies: DG Chest 2 View  Result Date: 01/11/2020 CLINICAL DATA:  ICD EXAM: CHEST - 2 VIEW COMPARISON:  01/07/2020 FINDINGS: Stable positioning of left chest wall ICD. Stable cardiomegaly. Mild linear bibasilar atelectasis. No new focal airspace consolidation, pleural effusion, or pneumothorax. Osseous structures appear intact. IMPRESSION: No active disease. Stable cardiomegaly and bibasilar atelectasis. Electronically Signed   By: Davina Poke D.O.   On: 01/11/2020 08:45   DG Chest 2 View  Result Date: 01/07/2020 CLINICAL DATA:  54 year old male with chest pain and shortness of breath. EXAM: CHEST - 2 VIEW COMPARISON:  Chest radiograph dated 11/10/2019. FINDINGS: There are bibasilar linear atelectasis/scarring. No focal consolidation, pleural effusion, or pneumothorax. Stable cardiomegaly. Left pectoral AICD device. No acute osseous pathology. IMPRESSION: 1. No acute cardiopulmonary process. 2. Stable cardiomegaly. Electronically Signed   By: Anner Crete M.D.   On: 01/07/2020 20:15   NM Myocar Multi W/Spect W/Wall Motion / EF  Result Date: 01/10/2020 CLINICAL DATA:  Chest pain EXAM: MYOCARDIAL IMAGING WITH SPECT (REST AND PHARMACOLOGIC-STRESS) GATED LEFT VENTRICULAR WALL MOTION STUDY LEFT VENTRICULAR EJECTION FRACTION TECHNIQUE: Standard myocardial SPECT imaging was performed after resting intravenous injection of 10 mCi Tc-58m tetrofosmin. Subsequently, intravenous infusion of  Lexiscan was performed under the supervision of the Cardiology staff. At peak effect of the drug, 30 mCi Tc-1m tetrofosmin was injected intravenously and standard myocardial SPECT imaging was performed. Quantitative gated imaging was also performed to evaluate left ventricular wall motion, and estimate left ventricular ejection fraction. COMPARISON:  None. FINDINGS: Perfusion: Single large fixed defect along the anterolateral wall of the left ventricle. No reversible ischemia. Wall Motion: Mild global hypokinesis. Associated left ventricular dilation. Left Ventricular Ejection Fraction: 45 % End diastolic volume 244 ml End systolic volume 010 ml IMPRESSION: 1. Large fixed defect along the anterolateral wall the left ventricle, raising concern for prior infarction. No reversible ischemia. 2. Mild global hypokinesis with associated left ventricular dilatation. 3. Left ventricular ejection fraction 45% 4. Non invasive risk stratification*: High *2012 Appropriate Use Criteria for Coronary Revascularization Focused Update: J Am Coll Cardiol. 2725;36(6):440-347. http://content.airportbarriers.com.aspx?articleid=1201161 Electronically Signed   By: Julian Hy M.D.   On: 01/10/2020 14:01   ECHOCARDIOGRAM COMPLETE  Result Date: 01/09/2020    ECHOCARDIOGRAM REPORT   Patient Name:   TILLMAN KAZMIERSKI Date of Exam: 01/09/2020 Medical Rec #:  [id number]        Height:       69.0 in Accession #:    [id number]       Weight:       166.9 lb Date of Birth:  1966/03/20       BSA:          1.913 m  Patient Age:    22 years         BP:           116/76 mmHg Patient Gender: M                HR:           84 bpm. Exam Location:  Inpatient Procedure: 2D Echo Indications:    chest pain 786.50  History:        Patient has prior history of Echocardiogram examinations, most                 recent 12/23/2013. CHF, Defibrillator, end stage renal disease;                 Risk Factors:Diabetes, Hypertension and Dyslipidemia.   Sonographer:    Johny Chess Referring Phys: [id number] Ynez Eugenio IMPRESSIONS  1. Severe biventricular failure.  2. Left ventricular ejection fraction, by estimation, is 30 to 35%. The left ventricle has moderately decreased function. The left ventricle has no regional wall motion abnormalities. The left ventricular internal cavity size was moderately dilated. Left ventricular diastolic parameters are consistent with Grade II diastolic dysfunction (pseudonormalization). Elevated left atrial pressure.  3. Right ventricular systolic function is severely reduced. The right ventricular size is severely enlarged. There is mildly elevated pulmonary artery systolic pressure. The estimated right ventricular systolic pressure is 35.3 mmHg.  4. Left atrial size was moderately dilated.  5. Right atrial size was moderately dilated.  6. The mitral valve is normal in structure. Mild mitral valve regurgitation. No evidence of mitral stenosis.  7. The aortic valve is normal in structure. Aortic valve regurgitation is not visualized. No aortic stenosis is present.  8. The inferior vena cava is normal in size with greater than 50% respiratory variability, suggesting right atrial pressure of 3 mmHg. FINDINGS  Left Ventricle: Left ventricular ejection fraction, by estimation, is 30 to 35%. The left ventricle has moderately decreased function. The left ventricle has no regional wall motion abnormalities. The left ventricular internal cavity size was moderately  dilated. There is no left ventricular hypertrophy. Left ventricular diastolic parameters are consistent with Grade II diastolic dysfunction (pseudonormalization). Elevated left atrial pressure. Right Ventricle: The right ventricular size is severely enlarged. No increase in right ventricular wall thickness. Right ventricular systolic function is severely reduced. There is mildly elevated pulmonary artery systolic pressure. The tricuspid regurgitant velocity is 2.63 m/s,  and with an assumed right atrial pressure of 3 mmHg, the estimated right ventricular systolic pressure is 29.9 mmHg. Left Atrium: Left atrial size was moderately dilated. Right Atrium: Right atrial size was moderately dilated. Pericardium: There is no evidence of pericardial effusion. Mitral Valve: The mitral valve is normal in structure. Normal mobility of the mitral valve leaflets. Mild mitral valve regurgitation. No evidence of mitral valve stenosis. Tricuspid Valve: The tricuspid valve is normal in structure. Tricuspid valve regurgitation is mild . No evidence of tricuspid stenosis. Aortic Valve: The aortic valve is normal in structure. Aortic valve regurgitation is not visualized. No aortic stenosis is present. Pulmonic Valve: The pulmonic valve was normal in structure. Pulmonic valve regurgitation is not visualized. No evidence of pulmonic stenosis. Aorta: The aortic root is normal in size and structure. Venous: The inferior vena cava is normal in size with greater than 50% respiratory variability, suggesting right atrial pressure of 3 mmHg. IAS/Shunts: No atrial level shunt detected by color flow Doppler.  LEFT VENTRICLE PLAX 2D LVIDd:  5.90 cm  Diastology LVIDs:         4.70 cm  LV e' lateral:   7.29 cm/s LV PW:         1.10 cm  LV E/e' lateral: 12.5 LV IVS:        0.90 cm  LV e' medial:    4.68 cm/s LVOT diam:     2.30 cm  LV E/e' medial:  19.4 LV SV:         69 LV SV Index:   36 LVOT Area:     4.15 cm  RIGHT VENTRICLE RV S prime:     12.50 cm/s TAPSE (M-mode): 2.1 cm LEFT ATRIUM             Index       RIGHT ATRIUM           Index LA diam:        4.50 cm 2.35 cm/m  RA Area:     20.00 cm LA Vol (A2C):   55.4 ml 28.96 ml/m RA Volume:   61.00 ml  31.89 ml/m LA Vol (A4C):   57.7 ml 30.16 ml/m LA Biplane Vol: 62.9 ml 32.88 ml/m  AORTIC VALVE LVOT Vmax:   89.40 cm/s LVOT Vmean:  57.700 cm/s LVOT VTI:    0.166 m  AORTA Ao Root diam: 3.20 cm MITRAL VALVE               TRICUSPID VALVE MV Area (PHT):  4.89 cm    TR Peak grad:   27.7 mmHg MV Decel Time: 155 msec    TR Vmax:        263.00 cm/s MV E velocity: 90.80 cm/s MV A velocity: 48.80 cm/s  SHUNTS MV E/A ratio:  1.86        Systemic VTI:  0.17 m                            Systemic Diam: 2.30 cm Ena Dawley MD Electronically signed by Ena Dawley MD Signature Date/Time: 01/09/2020/1:19:40 PM    Final    HYBRID OR IMAGING ([place])  Result Date: 01/10/2020 There is no interpretation for this exam.  This order is for images obtained during a surgical procedure.  Please See "Surgeries" Tab for more information regarding the procedure.       Subjective: Patient seen and examined at the bedside this morning.  Hemodynamically stable for discharge.  Discharge Exam: Vitals:   01/11/20 1206 01/11/20 1239  BP: (!) 144/59 130/78  Pulse: 85 96  Resp: 16 16  Temp: 98.1 F (36.7 C) 98.1 F (36.7 C)  SpO2: 98% 99%   Vitals:   01/11/20 1130 01/11/20 1200 01/11/20 1206 01/11/20 1239  BP: (!) 154/71 (!) 163/88 (!) 144/59 130/78  Pulse: 87 81 85 96  Resp: (!) 22 (!) 21 16 16   Temp:   98.1 F (36.7 C) 98.1 F (36.7 C)  TempSrc:   Oral Oral  SpO2:   98% 99%  Weight:   65.8 kg   Height:        General: Pt is alert, awake, not in acute distress Cardiovascular: RRR, S1/S2 +, no rubs, no gallops Respiratory: CTA bilaterally, no wheezing, no rhonchi Abdominal: Soft, NT, ND, bowel sounds + Extremities: no edema, no cyanosis    The results of significant diagnostics from this hospitalization (including imaging, microbiology, ancillary and laboratory) are listed below for reference.  Microbiology: Recent Results (from the past 240 hour(s))  SARS CORONAVIRUS 2 (TAT 6-24 HRS) Nasopharyngeal Nasopharyngeal Swab     Status: None   Collection Time: 01/07/20  8:40 AM   Specimen: Nasopharyngeal Swab  Result Value Ref Range Status   SARS Coronavirus 2 NEGATIVE NEGATIVE Final    Comment: (NOTE) SARS-CoV-2 target nucleic acids are  NOT DETECTED. The SARS-CoV-2 RNA is generally detectable in upper and lower respiratory specimens during the acute phase of infection. Negative results do not preclude SARS-CoV-2 infection, do not rule out co-infections with other pathogens, and should not be used as the sole basis for treatment or other patient management decisions. Negative results must be combined with clinical observations, patient history, and epidemiological information. The expected result is Negative. Fact Sheet for Patients: SugarRoll.be Fact Sheet for Healthcare Providers: https://www.woods-mathews.com/ This test is not yet approved or cleared by the [place] FDA and  has been authorized for detection and/or diagnosis of SARS-CoV-2 by FDA under an Emergency Use Authorization (EUA). This EUA will remain  in effect (meaning this test can be used) for the duration of the COVID-19 declaration under Section 56 4(b)(1) of the Act, 21 U.S.C. section 360bbb-3(b)(1), unless the authorization is terminated or revoked sooner. Performed at [facility], [place] [address] [place], [place] [address]   SARS CORONAVIRUS 2 (TAT 6-24 HRS) Nasopharyngeal Nasopharyngeal Swab     Status: None   Collection Time: 01/07/20 11:51 PM   Specimen: Nasopharyngeal Swab  Result Value Ref Range Status   SARS Coronavirus 2 NEGATIVE NEGATIVE Final    Comment: (NOTE) SARS-CoV-2 target nucleic acids are NOT DETECTED. The SARS-CoV-2 RNA is generally detectable in upper and lower respiratory specimens during the acute phase of infection. Negative results do not preclude SARS-CoV-2 infection, do not rule out co-infections with other pathogens, and should not be used as the sole basis for treatment or other patient management decisions. Negative results must be combined with clinical observations, patient history, and epidemiological information. The expected result is Negative. Fact Sheet for  Patients: SugarRoll.be Fact Sheet for Healthcare Providers: https://www.woods-mathews.com/ This test is not yet approved or cleared by the [place] FDA and  has been authorized for detection and/or diagnosis of SARS-CoV-2 by FDA under an Emergency Use Authorization (EUA). This EUA will remain  in effect (meaning this test can be used) for the duration of the COVID-19 declaration under Section 56 4(b)(1) of the Act, 21 U.S.C. section 360bbb-3(b)(1), unless the authorization is terminated or revoked sooner. Performed at [facility], [place] [address] [place], [place] [address]   Respiratory Panel by PCR     Status: None   Collection Time: 01/08/20 12:14 AM   Specimen: Nasopharyngeal Swab; Respiratory  Result Value Ref Range Status   Adenovirus NOT DETECTED NOT DETECTED Final   Coronavirus 229E NOT DETECTED NOT DETECTED Final    Comment: (NOTE) The Coronavirus on the Respiratory Panel, DOES NOT test for the novel  Coronavirus (2019 nCoV)    Coronavirus HKU1 NOT DETECTED NOT DETECTED Final   Coronavirus NL63 NOT DETECTED NOT DETECTED Final   Coronavirus OC43 NOT DETECTED NOT DETECTED Final   Metapneumovirus NOT DETECTED NOT DETECTED Final   Rhinovirus / Enterovirus NOT DETECTED NOT DETECTED Final   Influenza A NOT DETECTED NOT DETECTED Final   Influenza B NOT DETECTED NOT DETECTED Final   Parainfluenza Virus 1 NOT DETECTED NOT DETECTED Final   Parainfluenza Virus 2 NOT DETECTED NOT DETECTED Final   Parainfluenza  Virus 3 NOT DETECTED NOT DETECTED Final   Parainfluenza Virus 4 NOT DETECTED NOT DETECTED Final   Respiratory Syncytial Virus NOT DETECTED NOT DETECTED Final   Bordetella pertussis NOT DETECTED NOT DETECTED Final   Chlamydophila pneumoniae NOT DETECTED NOT DETECTED Final   Mycoplasma pneumoniae NOT DETECTED NOT DETECTED Final    Comment: Performed at [facility], [place] [address], [place], [place] [address]  Culture,  blood (Routine X 2) w Reflex to ID Panel     Status: None (Preliminary result)   Collection Time: 01/08/20  4:34 PM   Specimen: BLOOD LEFT HAND  Result Value Ref Range Status   Specimen Description BLOOD LEFT HAND  Final   Special Requests AEROBIC BOTTLE ONLY Blood Culture adequate volume  Final   Culture   Final    NO GROWTH 3 DAYS Performed at [facility], [place] [address], [place], [place] [address]    Report Status PENDING  Incomplete  Culture, blood (Routine X 2) w Reflex to ID Panel     Status: None (Preliminary result)   Collection Time: 01/08/20  4:39 PM   Specimen: BLOOD RIGHT HAND  Result Value Ref Range Status   Specimen Description BLOOD RIGHT HAND  Final   Special Requests   Final    AEROBIC BOTTLE ONLY Blood Culture results may not be optimal due to an inadequate volume of blood received in culture bottles   Culture   Final    NO GROWTH 3 DAYS Performed at [facility], [place] [address], [place], [place] [address]    Report Status PENDING  Incomplete  Surgical pcr screen     Status: None   Collection Time: 01/10/20  1:41 PM   Specimen: Nasal Mucosa; Nasal Swab  Result Value Ref Range Status   MRSA, PCR NEGATIVE NEGATIVE Final   Staphylococcus aureus NEGATIVE NEGATIVE Final    Comment: (NOTE) The Xpert SA Assay (FDA approved for NASAL specimens in patients 84 years of age and older), is one component of a comprehensive surveillance program. It is not intended to diagnose infection nor to guide or monitor treatment. Performed at [facility], [place] [address] [place], [place] [address]      Labs: BNP (last 3 results) No results for input(s): BNP in the last 8760 hours. Basic Metabolic Panel: Recent Labs  Lab 01/07/20 1953 01/08/20 0215 01/09/20 0344 01/09/20 0816 01/11/20 0819  NA 141 141 135 134* 135  K 4.5 4.3 3.8 3.7 5.1  CL 99 101 93* 95* 95*  CO2 27 24 28 26 23   GLUCOSE 103* 102* 95 97 107*  BUN 34* 46* 21* 24* 53*  CREATININE  8.48* 9.36* 6.43* 6.97* 12.52*  CALCIUM 8.7* 8.5* 7.9* 8.0* 8.4*  PHOS  --   --   --  4.6 5.3*   Liver Function Tests: Recent Labs  Lab 01/09/20 0816 01/11/20 0819  ALBUMIN 2.8* 2.6*   Recent Labs  Lab 01/08/20 0002  LIPASE 35   No results for input(s): AMMONIA in the last 168 hours. CBC: Recent Labs  Lab 01/07/20 1953 01/08/20 1048 01/09/20 0344 01/10/20 0136 01/11/20 0452  WBC 6.2 5.5 7.3 6.8 8.5  NEUTROABS  --   --  4.2  --   --   HGB 8.7* 7.6* 8.1* 8.4* 8.4*  HCT 27.8* 24.4* 25.2* 25.8* 26.6*  MCV 93.9 94.9 91.0 90.5 93.0  PLT 175 141* 156 158 219   Cardiac Enzymes: No results for  input(s): CKTOTAL, CKMB, CKMBINDEX, TROPONINI in the last 168 hours. BNP: Invalid input(s): POCBNP CBG: Recent Labs  Lab 01/10/20 1749  GLUCAP 87   D-Dimer No results for input(s): DDIMER in the last 72 hours. Hgb A1c No results for input(s): HGBA1C in the last 72 hours. Lipid Profile No results for input(s): CHOL, HDL, LDLCALC, TRIG, CHOLHDL, LDLDIRECT in the last 72 hours. Thyroid function studies No results for input(s): TSH, T4TOTAL, T3FREE, THYROIDAB in the last 72 hours.  Invalid input(s): FREET3 Anemia work up No results for input(s): VITAMINB12, FOLATE, FERRITIN, TIBC, IRON, RETICCTPCT in the last 72 hours. Urinalysis    Component Value Date/Time   COLORURINE RED (A) 07/06/2016 1938   APPEARANCEUR TURBID (A) 07/06/2016 1938   LABSPEC 1.020 07/06/2016 1938   PHURINE 7.5 07/06/2016 1938   GLUCOSEU NEGATIVE 07/06/2016 1938   HGBUR LARGE (A) 07/06/2016 1938   BILIRUBINUR NEGATIVE 07/06/2016 1938   KETONESUR NEGATIVE 07/06/2016 1938   PROTEINUR >300 (A) 07/06/2016 1938   UROBILINOGEN 0.2 11/04/2014 0952   NITRITE NEGATIVE 07/06/2016 1938   LEUKOCYTESUR SMALL (A) 07/06/2016 1938   Sepsis Labs Invalid input(s): PROCALCITONIN,  WBC,  LACTICIDVEN Microbiology Recent Results (from the past 240 hour(s))  SARS CORONAVIRUS 2 (TAT 6-24 HRS) Nasopharyngeal  Nasopharyngeal Swab     Status: None   Collection Time: 01/07/20  8:40 AM   Specimen: Nasopharyngeal Swab  Result Value Ref Range Status   SARS Coronavirus 2 NEGATIVE NEGATIVE Final    Comment: (NOTE) SARS-CoV-2 target nucleic acids are NOT DETECTED. The SARS-CoV-2 RNA is generally detectable in upper and lower respiratory specimens during the acute phase of infection. Negative results do not preclude SARS-CoV-2 infection, do not rule out co-infections with other pathogens, and should not be used as the sole basis for treatment or other patient management decisions. Negative results must be combined with clinical observations, patient history, and epidemiological information. The expected result is Negative. Fact Sheet for Patients: SugarRoll.be Fact Sheet for Healthcare Providers: https://www.woods-mathews.com/ This test is not yet approved or cleared by the [place] FDA and  has been authorized for detection and/or diagnosis of SARS-CoV-2 by FDA under an Emergency Use Authorization (EUA). This EUA will remain  in effect (meaning this test can be used) for the duration of the COVID-19 declaration under Section 56 4(b)(1) of the Act, 21 U.S.C. section 360bbb-3(b)(1), unless the authorization is terminated or revoked sooner. Performed at [facility], [place] [address], [place], [place] [address]   SARS CORONAVIRUS 2 (TAT 6-24 HRS) Nasopharyngeal Nasopharyngeal Swab     Status: None   Collection Time: 01/07/20 11:51 PM   Specimen: Nasopharyngeal Swab  Result Value Ref Range Status   SARS Coronavirus 2 NEGATIVE NEGATIVE Final    Comment: (NOTE) SARS-CoV-2 target nucleic acids are NOT DETECTED. The SARS-CoV-2 RNA is generally detectable in upper and lower respiratory specimens during the acute phase of infection. Negative results do not preclude SARS-CoV-2 infection, do not rule out co-infections with other pathogens, and should not  be used as the sole basis for treatment or other patient management decisions. Negative results must be combined with clinical observations, patient history, and epidemiological information. The expected result is Negative. Fact Sheet for Patients: SugarRoll.be Fact Sheet for Healthcare Providers: https://www.woods-mathews.com/ This test is not yet approved or cleared by the [place] FDA and  has been authorized for detection and/or diagnosis of SARS-CoV-2 by FDA under an Emergency Use Authorization (EUA). This EUA will remain  in effect (meaning this  test can be used) for the duration of the COVID-19 declaration under Section 56 4(b)(1) of the Act, 21 U.S.C. section 360bbb-3(b)(1), unless the authorization is terminated or revoked sooner. Performed at [facility], [place] [address] [place], [place] [address]   Respiratory Panel by PCR     Status: None   Collection Time: 01/08/20 12:14 AM   Specimen: Nasopharyngeal Swab; Respiratory  Result Value Ref Range Status   Adenovirus NOT DETECTED NOT DETECTED Final   Coronavirus 229E NOT DETECTED NOT DETECTED Final    Comment: (NOTE) The Coronavirus on the Respiratory Panel, DOES NOT test for the novel  Coronavirus (2019 nCoV)    Coronavirus HKU1 NOT DETECTED NOT DETECTED Final   Coronavirus NL63 NOT DETECTED NOT DETECTED Final   Coronavirus OC43 NOT DETECTED NOT DETECTED Final   Metapneumovirus NOT DETECTED NOT DETECTED Final   Rhinovirus / Enterovirus NOT DETECTED NOT DETECTED Final   Influenza A NOT DETECTED NOT DETECTED Final   Influenza B NOT DETECTED NOT DETECTED Final   Parainfluenza Virus 1 NOT DETECTED NOT DETECTED Final   Parainfluenza Virus 2 NOT DETECTED NOT DETECTED Final   Parainfluenza Virus 3 NOT DETECTED NOT DETECTED Final   Parainfluenza Virus 4 NOT DETECTED NOT DETECTED Final   Respiratory Syncytial Virus NOT DETECTED NOT DETECTED Final   Bordetella pertussis NOT  DETECTED NOT DETECTED Final   Chlamydophila pneumoniae NOT DETECTED NOT DETECTED Final   Mycoplasma pneumoniae NOT DETECTED NOT DETECTED Final    Comment: Performed at [facility], [place]. [address], [place], [place] [address]  Culture, blood (Routine X 2) w Reflex to ID Panel     Status: None (Preliminary result)   Collection Time: 01/08/20  4:34 PM   Specimen: BLOOD LEFT HAND  Result Value Ref Range Status   Specimen Description BLOOD LEFT HAND  Final   Special Requests AEROBIC BOTTLE ONLY Blood Culture adequate volume  Final   Culture   Final    NO GROWTH 3 DAYS Performed at [facility], [place] [address] [place], [place] [address]    Report Status PENDING  Incomplete  Culture, blood (Routine X 2) w Reflex to ID Panel     Status: None (Preliminary result)   Collection Time: 01/08/20  4:39 PM   Specimen: BLOOD RIGHT HAND  Result Value Ref Range Status   Specimen Description BLOOD RIGHT HAND  Final   Special Requests   Final    AEROBIC BOTTLE ONLY Blood Culture results may not be optimal due to an inadequate volume of blood received in culture bottles   Culture   Final    NO GROWTH 3 DAYS Performed at [facility], [place] [address], [place], [place] [address]    Report Status PENDING  Incomplete  Surgical pcr screen     Status: None   Collection Time: 01/10/20  1:41 PM   Specimen: Nasal Mucosa; Nasal Swab  Result Value Ref Range Status   MRSA, PCR NEGATIVE NEGATIVE Final   Staphylococcus aureus NEGATIVE NEGATIVE Final    Comment: (NOTE) The Xpert SA Assay (FDA approved for NASAL specimens in patients 85 years of age and older), is one component of a comprehensive surveillance program. It is not intended to diagnose infection nor to guide or monitor treatment. Performed at [facility], [place] [address] [place], [place] [address]     Please note: You were cared for by a hospitalist during your hospital stay. Once you are  discharged, your primary care  physician will handle any further medical issues. Please note that NO REFILLS for any discharge medications will be authorized once you are discharged, as it is imperative that you return to your primary care physician (or establish a relationship with a primary care physician if you do not have one) for your post hospital discharge needs so that they can reassess your need for medications and monitor your lab values.    Time coordinating discharge: 40 minutes  SIGNED:   Shelly Coss, MD  [facility] 01/11/2020, 12:49 PM Pager [phone]  If 7PM-7AM, please contact night-coverage [email or web address] Password [email or web address]

## 2020-01-11 NOTE — Progress Notes (Signed)
Orders received to discharge patient.  Telemetry monitor removed and CCMD notified.  PIV access removed.  Discharge instructions, follow up, medications and instructions for their use discussed with patient. 

## 2020-01-13 ENCOUNTER — Telehealth: Payer: Self-pay | Admitting: Nephrology

## 2020-01-13 DIAGNOSIS — N186 End stage renal disease: Secondary | ICD-10-CM | POA: Diagnosis not present

## 2020-01-13 DIAGNOSIS — D509 Iron deficiency anemia, unspecified: Secondary | ICD-10-CM | POA: Diagnosis not present

## 2020-01-13 DIAGNOSIS — D631 Anemia in chronic kidney disease: Secondary | ICD-10-CM | POA: Diagnosis not present

## 2020-01-13 DIAGNOSIS — N2581 Secondary hyperparathyroidism of renal origin: Secondary | ICD-10-CM | POA: Diagnosis not present

## 2020-01-13 DIAGNOSIS — Z992 Dependence on renal dialysis: Secondary | ICD-10-CM | POA: Diagnosis not present

## 2020-01-13 LAB — TYPE AND SCREEN
ABO/RH(D): A POS
Antibody Screen: NEGATIVE
Unit division: 0
Unit division: 0
Unit division: 0
Unit division: 0

## 2020-01-13 LAB — BPAM RBC
Blood Product Expiration Date: 202104112359
Blood Product Expiration Date: 202104112359
Blood Product Expiration Date: 202104112359
Blood Product Expiration Date: 202104112359
ISSUE DATE / TIME: 202103151526
ISSUE DATE / TIME: 202103151526
Unit Type and Rh: 6200
Unit Type and Rh: 6200
Unit Type and Rh: 6200
Unit Type and Rh: 6200

## 2020-01-13 LAB — CULTURE, BLOOD (ROUTINE X 2)
Culture: NO GROWTH
Culture: NO GROWTH
Special Requests: ADEQUATE

## 2020-01-13 NOTE — Telephone Encounter (Signed)
Transition of care contact from inpatient facility  Date of Discharge: 01/11/20 Date of Contact: 01/13/20 Method of contact: telephone Talked with: brother of patient who is his caretaker  Patient cartaker  contact to discuss transition of care from recent inpatient hospitalization. Pateint was admitted to [facility] from : 3/12-3/16/21 with the diagnosis of chest pain and also had removal and replacment of ICD lead.  Medication changes were reviewed. No changes - has all meds  Patient will follow up at outpatient dialysis 3/18 - currently at dialysis   Other follow up needs: none He has planned f/u with Dr. Lovena Le tomorrow.  Amalia Hailey, PA-C [facility] Pager:  [phone]

## 2020-01-14 ENCOUNTER — Other Ambulatory Visit: Payer: Self-pay

## 2020-01-14 ENCOUNTER — Ambulatory Visit (INDEPENDENT_AMBULATORY_CARE_PROVIDER_SITE_OTHER): Payer: Medicare Other | Admitting: Student

## 2020-01-14 DIAGNOSIS — Z4502 Encounter for adjustment and management of automatic implantable cardiac defibrillator: Secondary | ICD-10-CM

## 2020-01-14 LAB — CUP PACEART INCLINIC DEVICE CHECK
Date Time Interrogation Session: 20210319141610
Implantable Lead Implant Date: 20210315
Implantable Lead Location: 753860
Implantable Lead Model: 6935
Implantable Pulse Generator Implant Date: 20190208

## 2020-01-14 NOTE — Progress Notes (Signed)
Early wound check for slight drainage s/p procedure.    Pressure dressing removed. No further drainage. No bright red blood. Scant old blood. No hematoma and scant ecchymosis. See Epic for picture.  Keep wound check for next week for steri-strip removal and device check.   Legrand Como [address] [place], PA-C  01/14/2020 2:17 PM

## 2020-01-15 DIAGNOSIS — N2581 Secondary hyperparathyroidism of renal origin: Secondary | ICD-10-CM | POA: Diagnosis not present

## 2020-01-15 DIAGNOSIS — D509 Iron deficiency anemia, unspecified: Secondary | ICD-10-CM | POA: Diagnosis not present

## 2020-01-15 DIAGNOSIS — D631 Anemia in chronic kidney disease: Secondary | ICD-10-CM | POA: Diagnosis not present

## 2020-01-15 DIAGNOSIS — Z992 Dependence on renal dialysis: Secondary | ICD-10-CM | POA: Diagnosis not present

## 2020-01-15 DIAGNOSIS — N186 End stage renal disease: Secondary | ICD-10-CM | POA: Diagnosis not present

## 2020-01-17 ENCOUNTER — Encounter: Payer: Self-pay | Admitting: *Deleted

## 2020-01-17 DIAGNOSIS — I120 Hypertensive chronic kidney disease with stage 5 chronic kidney disease or end stage renal disease: Secondary | ICD-10-CM | POA: Diagnosis not present

## 2020-01-17 DIAGNOSIS — R0989 Other specified symptoms and signs involving the circulatory and respiratory systems: Secondary | ICD-10-CM | POA: Diagnosis not present

## 2020-01-17 DIAGNOSIS — I517 Cardiomegaly: Secondary | ICD-10-CM | POA: Diagnosis not present

## 2020-01-17 DIAGNOSIS — R05 Cough: Secondary | ICD-10-CM | POA: Diagnosis not present

## 2020-01-17 DIAGNOSIS — I10 Essential (primary) hypertension: Secondary | ICD-10-CM | POA: Diagnosis not present

## 2020-01-17 DIAGNOSIS — N186 End stage renal disease: Secondary | ICD-10-CM | POA: Diagnosis not present

## 2020-01-17 DIAGNOSIS — R918 Other nonspecific abnormal finding of lung field: Secondary | ICD-10-CM | POA: Diagnosis not present

## 2020-01-17 DIAGNOSIS — Z992 Dependence on renal dialysis: Secondary | ICD-10-CM | POA: Diagnosis not present

## 2020-01-18 DIAGNOSIS — D509 Iron deficiency anemia, unspecified: Secondary | ICD-10-CM | POA: Diagnosis not present

## 2020-01-18 DIAGNOSIS — D631 Anemia in chronic kidney disease: Secondary | ICD-10-CM | POA: Diagnosis not present

## 2020-01-18 DIAGNOSIS — N2581 Secondary hyperparathyroidism of renal origin: Secondary | ICD-10-CM | POA: Diagnosis not present

## 2020-01-18 DIAGNOSIS — N186 End stage renal disease: Secondary | ICD-10-CM | POA: Diagnosis not present

## 2020-01-18 DIAGNOSIS — Z992 Dependence on renal dialysis: Secondary | ICD-10-CM | POA: Diagnosis not present

## 2020-01-20 ENCOUNTER — Other Ambulatory Visit: Payer: Self-pay

## 2020-01-20 ENCOUNTER — Ambulatory Visit (INDEPENDENT_AMBULATORY_CARE_PROVIDER_SITE_OTHER): Payer: Medicare Other | Admitting: *Deleted

## 2020-01-20 ENCOUNTER — Telehealth: Payer: Self-pay | Admitting: Emergency Medicine

## 2020-01-20 ENCOUNTER — Ambulatory Visit: Payer: Medicare Other

## 2020-01-20 DIAGNOSIS — D509 Iron deficiency anemia, unspecified: Secondary | ICD-10-CM | POA: Diagnosis not present

## 2020-01-20 DIAGNOSIS — I4901 Ventricular fibrillation: Secondary | ICD-10-CM | POA: Diagnosis not present

## 2020-01-20 DIAGNOSIS — D631 Anemia in chronic kidney disease: Secondary | ICD-10-CM | POA: Diagnosis not present

## 2020-01-20 DIAGNOSIS — Z95810 Presence of automatic (implantable) cardiac defibrillator: Secondary | ICD-10-CM

## 2020-01-20 DIAGNOSIS — Z992 Dependence on renal dialysis: Secondary | ICD-10-CM | POA: Diagnosis not present

## 2020-01-20 DIAGNOSIS — T82110A Breakdown (mechanical) of cardiac electrode, initial encounter: Secondary | ICD-10-CM

## 2020-01-20 DIAGNOSIS — N2581 Secondary hyperparathyroidism of renal origin: Secondary | ICD-10-CM | POA: Diagnosis not present

## 2020-01-20 DIAGNOSIS — N186 End stage renal disease: Secondary | ICD-10-CM | POA: Diagnosis not present

## 2020-01-20 NOTE — Patient Instructions (Signed)
Call office if you develop any redness, drainage, or swelling at wound site. Call if you develop a fever or chills. [phone]

## 2020-01-20 NOTE — Telephone Encounter (Signed)
LMOM. Need remote transmission from ICD.

## 2020-01-22 DIAGNOSIS — N2581 Secondary hyperparathyroidism of renal origin: Secondary | ICD-10-CM | POA: Diagnosis not present

## 2020-01-22 DIAGNOSIS — Z992 Dependence on renal dialysis: Secondary | ICD-10-CM | POA: Diagnosis not present

## 2020-01-22 DIAGNOSIS — D509 Iron deficiency anemia, unspecified: Secondary | ICD-10-CM | POA: Diagnosis not present

## 2020-01-22 DIAGNOSIS — N186 End stage renal disease: Secondary | ICD-10-CM | POA: Diagnosis not present

## 2020-01-22 DIAGNOSIS — D631 Anemia in chronic kidney disease: Secondary | ICD-10-CM | POA: Diagnosis not present

## 2020-01-24 LAB — CUP PACEART INCLINIC DEVICE CHECK
Date Time Interrogation Session: 20210325200518
Implantable Lead Implant Date: 20210315
Implantable Lead Location: 753860
Implantable Lead Model: 6935
Implantable Pulse Generator Implant Date: 20190208
Lead Channel Pacing Threshold Amplitude: 0.5 V
Lead Channel Pacing Threshold Pulse Width: 0.4 ms
Lead Channel Sensing Intrinsic Amplitude: 7.1 mV
Lead Channel Setting Pacing Amplitude: 3.5 V
Lead Channel Setting Pacing Pulse Width: 0.8 ms
Lead Channel Setting Sensing Sensitivity: 0.3 mV

## 2020-01-24 NOTE — Progress Notes (Signed)
Wound check appointment. Steri-strips removed. Wound without redness or edema. Incision edges approximated, wound well healed. Normal device function. Thresholds, sensing, and impedances consistent with implant measurements. Device programmed at 3.5V for extra safety margin until 3 month visit. Histogram distribution appropriate for patient and level of activity. No ventricular arrhythmias noted. Interpreter present for all education. Patient educated about wound care, arm mobility, lifting restrictions, shock plan. Follow up with GT 04/20/20.  Enrolled in remote monitoring and next remote scheduled 04/13/20.

## 2020-01-24 NOTE — Telephone Encounter (Signed)
Transmission received and reviewed. Optivol elevated- patient on dialysis.

## 2020-01-25 DIAGNOSIS — D509 Iron deficiency anemia, unspecified: Secondary | ICD-10-CM | POA: Diagnosis not present

## 2020-01-25 DIAGNOSIS — D631 Anemia in chronic kidney disease: Secondary | ICD-10-CM | POA: Diagnosis not present

## 2020-01-25 DIAGNOSIS — N186 End stage renal disease: Secondary | ICD-10-CM | POA: Diagnosis not present

## 2020-01-25 DIAGNOSIS — N2581 Secondary hyperparathyroidism of renal origin: Secondary | ICD-10-CM | POA: Diagnosis not present

## 2020-01-25 DIAGNOSIS — Z992 Dependence on renal dialysis: Secondary | ICD-10-CM | POA: Diagnosis not present

## 2020-01-27 DIAGNOSIS — N032 Chronic nephritic syndrome with diffuse membranous glomerulonephritis: Secondary | ICD-10-CM | POA: Diagnosis not present

## 2020-01-27 DIAGNOSIS — N2581 Secondary hyperparathyroidism of renal origin: Secondary | ICD-10-CM | POA: Diagnosis not present

## 2020-01-27 DIAGNOSIS — Z992 Dependence on renal dialysis: Secondary | ICD-10-CM | POA: Diagnosis not present

## 2020-01-27 DIAGNOSIS — D509 Iron deficiency anemia, unspecified: Secondary | ICD-10-CM | POA: Diagnosis not present

## 2020-01-27 DIAGNOSIS — D631 Anemia in chronic kidney disease: Secondary | ICD-10-CM | POA: Diagnosis not present

## 2020-01-27 DIAGNOSIS — N186 End stage renal disease: Secondary | ICD-10-CM | POA: Diagnosis not present

## 2020-01-29 DIAGNOSIS — D631 Anemia in chronic kidney disease: Secondary | ICD-10-CM | POA: Diagnosis not present

## 2020-01-29 DIAGNOSIS — D509 Iron deficiency anemia, unspecified: Secondary | ICD-10-CM | POA: Diagnosis not present

## 2020-01-29 DIAGNOSIS — N2581 Secondary hyperparathyroidism of renal origin: Secondary | ICD-10-CM | POA: Diagnosis not present

## 2020-01-29 DIAGNOSIS — N186 End stage renal disease: Secondary | ICD-10-CM | POA: Diagnosis not present

## 2020-01-29 DIAGNOSIS — Z992 Dependence on renal dialysis: Secondary | ICD-10-CM | POA: Diagnosis not present

## 2020-02-01 DIAGNOSIS — D509 Iron deficiency anemia, unspecified: Secondary | ICD-10-CM | POA: Diagnosis not present

## 2020-02-01 DIAGNOSIS — N186 End stage renal disease: Secondary | ICD-10-CM | POA: Diagnosis not present

## 2020-02-01 DIAGNOSIS — N2581 Secondary hyperparathyroidism of renal origin: Secondary | ICD-10-CM | POA: Diagnosis not present

## 2020-02-01 DIAGNOSIS — D631 Anemia in chronic kidney disease: Secondary | ICD-10-CM | POA: Diagnosis not present

## 2020-02-01 DIAGNOSIS — Z992 Dependence on renal dialysis: Secondary | ICD-10-CM | POA: Diagnosis not present

## 2020-02-02 ENCOUNTER — Encounter: Payer: Medicare Other | Admitting: Internal Medicine

## 2020-02-03 DIAGNOSIS — D631 Anemia in chronic kidney disease: Secondary | ICD-10-CM | POA: Diagnosis not present

## 2020-02-03 DIAGNOSIS — N186 End stage renal disease: Secondary | ICD-10-CM | POA: Diagnosis not present

## 2020-02-03 DIAGNOSIS — Z992 Dependence on renal dialysis: Secondary | ICD-10-CM | POA: Diagnosis not present

## 2020-02-03 DIAGNOSIS — D509 Iron deficiency anemia, unspecified: Secondary | ICD-10-CM | POA: Diagnosis not present

## 2020-02-03 DIAGNOSIS — N2581 Secondary hyperparathyroidism of renal origin: Secondary | ICD-10-CM | POA: Diagnosis not present

## 2020-02-05 DIAGNOSIS — Z992 Dependence on renal dialysis: Secondary | ICD-10-CM | POA: Diagnosis not present

## 2020-02-05 DIAGNOSIS — N186 End stage renal disease: Secondary | ICD-10-CM | POA: Diagnosis not present

## 2020-02-05 DIAGNOSIS — D509 Iron deficiency anemia, unspecified: Secondary | ICD-10-CM | POA: Diagnosis not present

## 2020-02-05 DIAGNOSIS — D631 Anemia in chronic kidney disease: Secondary | ICD-10-CM | POA: Diagnosis not present

## 2020-02-05 DIAGNOSIS — N2581 Secondary hyperparathyroidism of renal origin: Secondary | ICD-10-CM | POA: Diagnosis not present

## 2020-02-08 DIAGNOSIS — D631 Anemia in chronic kidney disease: Secondary | ICD-10-CM | POA: Diagnosis not present

## 2020-02-08 DIAGNOSIS — D509 Iron deficiency anemia, unspecified: Secondary | ICD-10-CM | POA: Diagnosis not present

## 2020-02-08 DIAGNOSIS — Z992 Dependence on renal dialysis: Secondary | ICD-10-CM | POA: Diagnosis not present

## 2020-02-08 DIAGNOSIS — N2581 Secondary hyperparathyroidism of renal origin: Secondary | ICD-10-CM | POA: Diagnosis not present

## 2020-02-08 DIAGNOSIS — N186 End stage renal disease: Secondary | ICD-10-CM | POA: Diagnosis not present

## 2020-02-10 DIAGNOSIS — D631 Anemia in chronic kidney disease: Secondary | ICD-10-CM | POA: Diagnosis not present

## 2020-02-10 DIAGNOSIS — Z992 Dependence on renal dialysis: Secondary | ICD-10-CM | POA: Diagnosis not present

## 2020-02-10 DIAGNOSIS — D509 Iron deficiency anemia, unspecified: Secondary | ICD-10-CM | POA: Diagnosis not present

## 2020-02-10 DIAGNOSIS — N186 End stage renal disease: Secondary | ICD-10-CM | POA: Diagnosis not present

## 2020-02-10 DIAGNOSIS — N2581 Secondary hyperparathyroidism of renal origin: Secondary | ICD-10-CM | POA: Diagnosis not present

## 2020-02-12 DIAGNOSIS — N2581 Secondary hyperparathyroidism of renal origin: Secondary | ICD-10-CM | POA: Diagnosis not present

## 2020-02-12 DIAGNOSIS — N186 End stage renal disease: Secondary | ICD-10-CM | POA: Diagnosis not present

## 2020-02-12 DIAGNOSIS — D509 Iron deficiency anemia, unspecified: Secondary | ICD-10-CM | POA: Diagnosis not present

## 2020-02-12 DIAGNOSIS — D631 Anemia in chronic kidney disease: Secondary | ICD-10-CM | POA: Diagnosis not present

## 2020-02-12 DIAGNOSIS — Z992 Dependence on renal dialysis: Secondary | ICD-10-CM | POA: Diagnosis not present

## 2020-02-15 DIAGNOSIS — D631 Anemia in chronic kidney disease: Secondary | ICD-10-CM | POA: Diagnosis not present

## 2020-02-15 DIAGNOSIS — I4891 Unspecified atrial fibrillation: Secondary | ICD-10-CM | POA: Diagnosis not present

## 2020-02-15 DIAGNOSIS — D509 Iron deficiency anemia, unspecified: Secondary | ICD-10-CM | POA: Diagnosis not present

## 2020-02-15 DIAGNOSIS — Z992 Dependence on renal dialysis: Secondary | ICD-10-CM | POA: Diagnosis not present

## 2020-02-15 DIAGNOSIS — N2581 Secondary hyperparathyroidism of renal origin: Secondary | ICD-10-CM | POA: Diagnosis not present

## 2020-02-15 DIAGNOSIS — N186 End stage renal disease: Secondary | ICD-10-CM | POA: Diagnosis not present

## 2020-02-17 DIAGNOSIS — D631 Anemia in chronic kidney disease: Secondary | ICD-10-CM | POA: Diagnosis not present

## 2020-02-17 DIAGNOSIS — N2581 Secondary hyperparathyroidism of renal origin: Secondary | ICD-10-CM | POA: Diagnosis not present

## 2020-02-17 DIAGNOSIS — N186 End stage renal disease: Secondary | ICD-10-CM | POA: Diagnosis not present

## 2020-02-17 DIAGNOSIS — Z992 Dependence on renal dialysis: Secondary | ICD-10-CM | POA: Diagnosis not present

## 2020-02-17 DIAGNOSIS — D509 Iron deficiency anemia, unspecified: Secondary | ICD-10-CM | POA: Diagnosis not present

## 2020-02-19 DIAGNOSIS — D509 Iron deficiency anemia, unspecified: Secondary | ICD-10-CM | POA: Diagnosis not present

## 2020-02-19 DIAGNOSIS — Z992 Dependence on renal dialysis: Secondary | ICD-10-CM | POA: Diagnosis not present

## 2020-02-19 DIAGNOSIS — D631 Anemia in chronic kidney disease: Secondary | ICD-10-CM | POA: Diagnosis not present

## 2020-02-19 DIAGNOSIS — N2581 Secondary hyperparathyroidism of renal origin: Secondary | ICD-10-CM | POA: Diagnosis not present

## 2020-02-19 DIAGNOSIS — N186 End stage renal disease: Secondary | ICD-10-CM | POA: Diagnosis not present

## 2020-02-22 DIAGNOSIS — Z992 Dependence on renal dialysis: Secondary | ICD-10-CM | POA: Diagnosis not present

## 2020-02-22 DIAGNOSIS — D509 Iron deficiency anemia, unspecified: Secondary | ICD-10-CM | POA: Diagnosis not present

## 2020-02-22 DIAGNOSIS — N186 End stage renal disease: Secondary | ICD-10-CM | POA: Diagnosis not present

## 2020-02-22 DIAGNOSIS — D631 Anemia in chronic kidney disease: Secondary | ICD-10-CM | POA: Diagnosis not present

## 2020-02-22 DIAGNOSIS — N2581 Secondary hyperparathyroidism of renal origin: Secondary | ICD-10-CM | POA: Diagnosis not present

## 2020-02-24 DIAGNOSIS — N2581 Secondary hyperparathyroidism of renal origin: Secondary | ICD-10-CM | POA: Diagnosis not present

## 2020-02-24 DIAGNOSIS — D631 Anemia in chronic kidney disease: Secondary | ICD-10-CM | POA: Diagnosis not present

## 2020-02-24 DIAGNOSIS — D509 Iron deficiency anemia, unspecified: Secondary | ICD-10-CM | POA: Diagnosis not present

## 2020-02-24 DIAGNOSIS — N186 End stage renal disease: Secondary | ICD-10-CM | POA: Diagnosis not present

## 2020-02-24 DIAGNOSIS — Z992 Dependence on renal dialysis: Secondary | ICD-10-CM | POA: Diagnosis not present

## 2020-02-26 DIAGNOSIS — E1122 Type 2 diabetes mellitus with diabetic chronic kidney disease: Secondary | ICD-10-CM | POA: Diagnosis not present

## 2020-02-26 DIAGNOSIS — Z992 Dependence on renal dialysis: Secondary | ICD-10-CM | POA: Diagnosis not present

## 2020-02-26 DIAGNOSIS — N186 End stage renal disease: Secondary | ICD-10-CM | POA: Diagnosis not present

## 2020-02-26 DIAGNOSIS — D509 Iron deficiency anemia, unspecified: Secondary | ICD-10-CM | POA: Diagnosis not present

## 2020-02-26 DIAGNOSIS — N2581 Secondary hyperparathyroidism of renal origin: Secondary | ICD-10-CM | POA: Diagnosis not present

## 2020-02-26 DIAGNOSIS — D631 Anemia in chronic kidney disease: Secondary | ICD-10-CM | POA: Diagnosis not present

## 2020-02-26 DIAGNOSIS — N032 Chronic nephritic syndrome with diffuse membranous glomerulonephritis: Secondary | ICD-10-CM | POA: Diagnosis not present

## 2020-02-29 DIAGNOSIS — D509 Iron deficiency anemia, unspecified: Secondary | ICD-10-CM | POA: Diagnosis not present

## 2020-02-29 DIAGNOSIS — N2581 Secondary hyperparathyroidism of renal origin: Secondary | ICD-10-CM | POA: Diagnosis not present

## 2020-02-29 DIAGNOSIS — D631 Anemia in chronic kidney disease: Secondary | ICD-10-CM | POA: Diagnosis not present

## 2020-02-29 DIAGNOSIS — N186 End stage renal disease: Secondary | ICD-10-CM | POA: Diagnosis not present

## 2020-02-29 DIAGNOSIS — E1122 Type 2 diabetes mellitus with diabetic chronic kidney disease: Secondary | ICD-10-CM | POA: Diagnosis not present

## 2020-02-29 DIAGNOSIS — Z992 Dependence on renal dialysis: Secondary | ICD-10-CM | POA: Diagnosis not present

## 2020-03-02 DIAGNOSIS — Z992 Dependence on renal dialysis: Secondary | ICD-10-CM | POA: Diagnosis not present

## 2020-03-02 DIAGNOSIS — N186 End stage renal disease: Secondary | ICD-10-CM | POA: Diagnosis not present

## 2020-03-02 DIAGNOSIS — N2581 Secondary hyperparathyroidism of renal origin: Secondary | ICD-10-CM | POA: Diagnosis not present

## 2020-03-02 DIAGNOSIS — D631 Anemia in chronic kidney disease: Secondary | ICD-10-CM | POA: Diagnosis not present

## 2020-03-02 DIAGNOSIS — D509 Iron deficiency anemia, unspecified: Secondary | ICD-10-CM | POA: Diagnosis not present

## 2020-03-02 DIAGNOSIS — E1122 Type 2 diabetes mellitus with diabetic chronic kidney disease: Secondary | ICD-10-CM | POA: Diagnosis not present

## 2020-03-04 DIAGNOSIS — E1122 Type 2 diabetes mellitus with diabetic chronic kidney disease: Secondary | ICD-10-CM | POA: Diagnosis not present

## 2020-03-04 DIAGNOSIS — N2581 Secondary hyperparathyroidism of renal origin: Secondary | ICD-10-CM | POA: Diagnosis not present

## 2020-03-04 DIAGNOSIS — D509 Iron deficiency anemia, unspecified: Secondary | ICD-10-CM | POA: Diagnosis not present

## 2020-03-04 DIAGNOSIS — N186 End stage renal disease: Secondary | ICD-10-CM | POA: Diagnosis not present

## 2020-03-04 DIAGNOSIS — Z992 Dependence on renal dialysis: Secondary | ICD-10-CM | POA: Diagnosis not present

## 2020-03-04 DIAGNOSIS — D631 Anemia in chronic kidney disease: Secondary | ICD-10-CM | POA: Diagnosis not present

## 2020-03-07 DIAGNOSIS — Z992 Dependence on renal dialysis: Secondary | ICD-10-CM | POA: Diagnosis not present

## 2020-03-07 DIAGNOSIS — D509 Iron deficiency anemia, unspecified: Secondary | ICD-10-CM | POA: Diagnosis not present

## 2020-03-07 DIAGNOSIS — E1122 Type 2 diabetes mellitus with diabetic chronic kidney disease: Secondary | ICD-10-CM | POA: Diagnosis not present

## 2020-03-07 DIAGNOSIS — D631 Anemia in chronic kidney disease: Secondary | ICD-10-CM | POA: Diagnosis not present

## 2020-03-07 DIAGNOSIS — N2581 Secondary hyperparathyroidism of renal origin: Secondary | ICD-10-CM | POA: Diagnosis not present

## 2020-03-07 DIAGNOSIS — N186 End stage renal disease: Secondary | ICD-10-CM | POA: Diagnosis not present

## 2020-03-09 DIAGNOSIS — D631 Anemia in chronic kidney disease: Secondary | ICD-10-CM | POA: Diagnosis not present

## 2020-03-09 DIAGNOSIS — D509 Iron deficiency anemia, unspecified: Secondary | ICD-10-CM | POA: Diagnosis not present

## 2020-03-09 DIAGNOSIS — N186 End stage renal disease: Secondary | ICD-10-CM | POA: Diagnosis not present

## 2020-03-09 DIAGNOSIS — N2581 Secondary hyperparathyroidism of renal origin: Secondary | ICD-10-CM | POA: Diagnosis not present

## 2020-03-09 DIAGNOSIS — E1122 Type 2 diabetes mellitus with diabetic chronic kidney disease: Secondary | ICD-10-CM | POA: Diagnosis not present

## 2020-03-09 DIAGNOSIS — Z992 Dependence on renal dialysis: Secondary | ICD-10-CM | POA: Diagnosis not present

## 2020-03-11 DIAGNOSIS — N2581 Secondary hyperparathyroidism of renal origin: Secondary | ICD-10-CM | POA: Diagnosis not present

## 2020-03-11 DIAGNOSIS — N186 End stage renal disease: Secondary | ICD-10-CM | POA: Diagnosis not present

## 2020-03-11 DIAGNOSIS — Z992 Dependence on renal dialysis: Secondary | ICD-10-CM | POA: Diagnosis not present

## 2020-03-11 DIAGNOSIS — E1122 Type 2 diabetes mellitus with diabetic chronic kidney disease: Secondary | ICD-10-CM | POA: Diagnosis not present

## 2020-03-11 DIAGNOSIS — D509 Iron deficiency anemia, unspecified: Secondary | ICD-10-CM | POA: Diagnosis not present

## 2020-03-11 DIAGNOSIS — D631 Anemia in chronic kidney disease: Secondary | ICD-10-CM | POA: Diagnosis not present

## 2020-03-14 DIAGNOSIS — D631 Anemia in chronic kidney disease: Secondary | ICD-10-CM | POA: Diagnosis not present

## 2020-03-14 DIAGNOSIS — E1122 Type 2 diabetes mellitus with diabetic chronic kidney disease: Secondary | ICD-10-CM | POA: Diagnosis not present

## 2020-03-14 DIAGNOSIS — Z992 Dependence on renal dialysis: Secondary | ICD-10-CM | POA: Diagnosis not present

## 2020-03-14 DIAGNOSIS — N2581 Secondary hyperparathyroidism of renal origin: Secondary | ICD-10-CM | POA: Diagnosis not present

## 2020-03-14 DIAGNOSIS — N186 End stage renal disease: Secondary | ICD-10-CM | POA: Diagnosis not present

## 2020-03-14 DIAGNOSIS — D509 Iron deficiency anemia, unspecified: Secondary | ICD-10-CM | POA: Diagnosis not present

## 2020-03-15 ENCOUNTER — Other Ambulatory Visit: Payer: Self-pay

## 2020-03-15 ENCOUNTER — Encounter: Payer: Self-pay | Admitting: Vascular Surgery

## 2020-03-15 ENCOUNTER — Other Ambulatory Visit (HOSPITAL_COMMUNITY)
Admission: RE | Admit: 2020-03-15 | Discharge: 2020-03-15 | Disposition: A | Discharge status: Home / Self Care | Payer: Medicare Other | Source: Ambulatory Visit | Attending: Vascular Surgery | Admitting: Vascular Surgery

## 2020-03-15 ENCOUNTER — Ambulatory Visit (INDEPENDENT_AMBULATORY_CARE_PROVIDER_SITE_OTHER): Payer: Medicare Other | Admitting: Vascular Surgery

## 2020-03-15 VITALS — BP 157/85 | HR 93 | Temp 97.7°F | Resp 20 | Ht 69.0 in | Wt 145.0 lb

## 2020-03-15 DIAGNOSIS — Z01812 Encounter for preprocedural laboratory examination: Secondary | ICD-10-CM | POA: Insufficient documentation

## 2020-03-15 DIAGNOSIS — N186 End stage renal disease: Secondary | ICD-10-CM | POA: Diagnosis not present

## 2020-03-15 DIAGNOSIS — Z20822 Contact with and (suspected) exposure to covid-19: Secondary | ICD-10-CM | POA: Diagnosis not present

## 2020-03-15 DIAGNOSIS — Z992 Dependence on renal dialysis: Secondary | ICD-10-CM

## 2020-03-15 NOTE — H&P (View-Only) (Signed)
REASON FOR VISIT:   Aneurysmal right AV fistula.  The consult is requested by Dr. Royce Macadamia.  MEDICAL ISSUES:   ANEURYSMAL RIGHT AV FISTULA: I think the patient is a candidate for stage plication of the aneurysms in his right radiocephalic AV fistula.  However the fistula is somewhat pulsatile suggesting an outflow obstruction and for this reason I think this should be addressed first.  Therefore I have scheduled him for a fistulogram on 03/17/2020 and possible venoplasty.  Once his this is complete we will schedule him for plication of the more central aneurysm first and then subsequently plication of the more distal aneurysm.  He dialyzes on Tuesdays Thursdays and Saturdays and we will work around his dialysis schedule.  We have discussed the indications of the procedure and the potential complications and he is agreeable to proceed.  HPI:   James Nicholson is a pleasant 54 y.o. male who has had a right forearm AV fistula since 2014.  This has been working well however he has developed 2 large aneurysms over the fistula presents for evaluation.  The history is obtained through his brother who translates for him.  The fistula has been reportedly working well.  He has no pain or paresthesias in his right arm.  Past Medical History:  Diagnosis Date  . Anemia   . Atrial fibrillation   . Cardiomyopathy ([place]) 2010   Unclear Etiology: Last Echo 06/2009: EF 40-45%, severe Lateral & apical Hypokinesis (? CAD) ; Grade 2 DDysfxn. Mild conc LVH.   Marland Kitchen Cellulitis   . Chronic kidney disease (CKD), stage V ([place])    Dialysis Tue, Thurs, Sat  . Dyslipidemia   . Enterobacter sepsis ([place])   . Hypertension   . IDDM (insulin dependent diabetes mellitus) 11/03/2014  . Pneumonia   . S/P ICD (internal cardiac defibrillator) procedure 2010   VT Arrest (in [place])  . Schizophrenia ([place])   . 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 Wegener's granulomatosis ([place])     Family History  Problem Relation  Age of Onset  . CAD Mother     SOCIAL HISTORY: Social History   Tobacco Use  . Smoking status: Former Smoker    Quit date: 10/28/2008    Years since quitting: 11.3  . Smokeless tobacco: Never Used  Substance Use Topics  . Alcohol use: No    No Known Allergies  Current Outpatient Medications  Medication Sig Dispense Refill  . atorvastatin (LIPITOR) 20 MG tablet Take 1 tablet (20 mg total) by mouth daily. 90 tablet 3  . AURYXIA 1 GM 210 MG(Fe) tablet Take 420 mg by mouth 3 (three) times daily.    . B Complex-C-Folic Acid (DIALYVITE TABLET) TABS Take by mouth.    . calcitRIOL (ROCALTROL) 0.5 MCG capsule     . calcium acetate (PHOSLO) 667 MG capsule Take 1,334 mg by mouth 3 (three) times daily.    Marland Kitchen gabapentin (NEURONTIN) 300 MG capsule     . metoprolol succinate (TOPROL-XL) 50 MG 24 hr tablet Take 1 tablet (50 mg total) by mouth daily. 90 tablet 3  . risperiDONE (RISPERDAL) 0.25 MG tablet Take 0.25 mg by mouth at bedtime.    . sulfamethoxazole-trimethoprim (BACTRIM DS) 800-160 MG tablet Take 1 tablet by mouth 3 (three) times a week.     No current facility-administered medications for this visit.    REVIEW OF SYSTEMS:  [X]  denotes positive finding, [ ]  denotes negative finding Cardiac  Comments:  Chest pain or chest pressure:  Shortness of breath upon exertion:    Short of breath when lying flat:    Irregular heart rhythm:        Vascular    Pain in calf, thigh, or hip brought on by ambulation:    Pain in feet at night that wakes you up from your sleep:     Blood clot in your veins:    Leg swelling:         Pulmonary    Oxygen at home:    Productive cough:     Wheezing:         Neurologic    Sudden weakness in arms or legs:     Sudden numbness in arms or legs:     Sudden onset of difficulty speaking or slurred speech:    Temporary loss of vision in one eye:     Problems with dizziness:         Gastrointestinal    Blood in stool:     Vomited blood:           Genitourinary    Burning when urinating:     Blood in urine:        Psychiatric    Major depression:         Hematologic    Bleeding problems:    Problems with blood clotting too easily:        Skin    Rashes or ulcers:        Constitutional    Fever or chills:     PHYSICAL EXAM:   There were no vitals filed for this visit.  GENERAL: The patient is a well-nourished male, in no acute distress. The vital signs are documented above. CARDIAC: There is a regular rate and rhythm.  VASCULAR: His fistula is somewhat pulsatile.  He has 2 large aneurysms as shown below.    PULMONARY: There is good air exchange bilaterally without wheezing or rales. ABDOMEN: Soft and non-tender with normal pitched bowel sounds.  MUSCULOSKELETAL: There are no major deformities or cyanosis. NEUROLOGIC: No focal weakness or paresthesias are detected. SKIN: There are no ulcers or rashes noted. PSYCHIATRIC: The patient has a normal affect.  DATA:    No new data  Deitra Mayo [facility] [phone]

## 2020-03-15 NOTE — Progress Notes (Signed)
REASON FOR VISIT:   Aneurysmal right AV fistula.  The consult is requested by Dr. Royce Macadamia.  MEDICAL ISSUES:   ANEURYSMAL RIGHT AV FISTULA: I think the patient is a candidate for stage plication of the aneurysms in his right radiocephalic AV fistula.  However the fistula is somewhat pulsatile suggesting an outflow obstruction and for this reason I think this should be addressed first.  Therefore I have scheduled him for a fistulogram on 03/17/2020 and possible venoplasty.  Once his this is complete we will schedule him for plication of the more central aneurysm first and then subsequently plication of the more distal aneurysm.  He dialyzes on Tuesdays Thursdays and Saturdays and we will work around his dialysis schedule.  We have discussed the indications of the procedure and the potential complications and he is agreeable to proceed.  HPI:   James Nicholson is a pleasant 54 y.o. male who has had a right forearm AV fistula since 2014.  This has been working well however he has developed 2 large aneurysms over the fistula presents for evaluation.  The history is obtained through his brother who translates for him.  The fistula has been reportedly working well.  He has no pain or paresthesias in his right arm.  Past Medical History:  Diagnosis Date  . Anemia   . Atrial fibrillation   . Cardiomyopathy ([place]) 2010   Unclear Etiology: Last Echo 06/2009: EF 40-45%, severe Lateral & apical Hypokinesis (? CAD) ; Grade 2 DDysfxn. Mild conc LVH.   Marland Kitchen Cellulitis   . Chronic kidney disease (CKD), stage V ([place])    Dialysis Tue, Thurs, Sat  . Dyslipidemia   . Enterobacter sepsis ([place])   . Hypertension   . IDDM (insulin dependent diabetes mellitus) 11/03/2014  . Pneumonia   . S/P ICD (internal cardiac defibrillator) procedure 2010   VT Arrest (in [place])  . Schizophrenia ([place])   . 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 Wegener's granulomatosis ([place])     Family History  Problem Relation  Age of Onset  . CAD Mother     SOCIAL HISTORY: Social History   Tobacco Use  . Smoking status: Former Smoker    Quit date: 10/28/2008    Years since quitting: 11.3  . Smokeless tobacco: Never Used  Substance Use Topics  . Alcohol use: No    No Known Allergies  Current Outpatient Medications  Medication Sig Dispense Refill  . atorvastatin (LIPITOR) 20 MG tablet Take 1 tablet (20 mg total) by mouth daily. 90 tablet 3  . AURYXIA 1 GM 210 MG(Fe) tablet Take 420 mg by mouth 3 (three) times daily.    . B Complex-C-Folic Acid (DIALYVITE TABLET) TABS Take by mouth.    . calcitRIOL (ROCALTROL) 0.5 MCG capsule     . calcium acetate (PHOSLO) 667 MG capsule Take 1,334 mg by mouth 3 (three) times daily.    Marland Kitchen gabapentin (NEURONTIN) 300 MG capsule     . metoprolol succinate (TOPROL-XL) 50 MG 24 hr tablet Take 1 tablet (50 mg total) by mouth daily. 90 tablet 3  . risperiDONE (RISPERDAL) 0.25 MG tablet Take 0.25 mg by mouth at bedtime.    . sulfamethoxazole-trimethoprim (BACTRIM DS) 800-160 MG tablet Take 1 tablet by mouth 3 (three) times a week.     No current facility-administered medications for this visit.    REVIEW OF SYSTEMS:  [X]  denotes positive finding, [ ]  denotes negative finding Cardiac  Comments:  Chest pain or chest pressure:  Shortness of breath upon exertion:    Short of breath when lying flat:    Irregular heart rhythm:        Vascular    Pain in calf, thigh, or hip brought on by ambulation:    Pain in feet at night that wakes you up from your sleep:     Blood clot in your veins:    Leg swelling:         Pulmonary    Oxygen at home:    Productive cough:     Wheezing:         Neurologic    Sudden weakness in arms or legs:     Sudden numbness in arms or legs:     Sudden onset of difficulty speaking or slurred speech:    Temporary loss of vision in one eye:     Problems with dizziness:         Gastrointestinal    Blood in stool:     Vomited blood:           Genitourinary    Burning when urinating:     Blood in urine:        Psychiatric    Major depression:         Hematologic    Bleeding problems:    Problems with blood clotting too easily:        Skin    Rashes or ulcers:        Constitutional    Fever or chills:     PHYSICAL EXAM:   There were no vitals filed for this visit.  GENERAL: The patient is a well-nourished male, in no acute distress. The vital signs are documented above. CARDIAC: There is a regular rate and rhythm.  VASCULAR: His fistula is somewhat pulsatile.  He has 2 large aneurysms as shown below.    PULMONARY: There is good air exchange bilaterally without wheezing or rales. ABDOMEN: Soft and non-tender with normal pitched bowel sounds.  MUSCULOSKELETAL: There are no major deformities or cyanosis. NEUROLOGIC: No focal weakness or paresthesias are detected. SKIN: There are no ulcers or rashes noted. PSYCHIATRIC: The patient has a normal affect.  DATA:    No new data  Deitra Mayo [facility] [phone]

## 2020-03-16 DIAGNOSIS — N186 End stage renal disease: Secondary | ICD-10-CM | POA: Diagnosis not present

## 2020-03-16 DIAGNOSIS — E1122 Type 2 diabetes mellitus with diabetic chronic kidney disease: Secondary | ICD-10-CM | POA: Diagnosis not present

## 2020-03-16 DIAGNOSIS — N2581 Secondary hyperparathyroidism of renal origin: Secondary | ICD-10-CM | POA: Diagnosis not present

## 2020-03-16 DIAGNOSIS — D509 Iron deficiency anemia, unspecified: Secondary | ICD-10-CM | POA: Diagnosis not present

## 2020-03-16 DIAGNOSIS — D631 Anemia in chronic kidney disease: Secondary | ICD-10-CM | POA: Diagnosis not present

## 2020-03-16 DIAGNOSIS — Z992 Dependence on renal dialysis: Secondary | ICD-10-CM | POA: Diagnosis not present

## 2020-03-16 LAB — SARS CORONAVIRUS 2 (TAT 6-24 HRS): SARS Coronavirus 2: NEGATIVE

## 2020-03-17 ENCOUNTER — Ambulatory Visit (HOSPITAL_COMMUNITY)
Admission: RE | Admit: 2020-03-17 | Discharge: 2020-03-17 | Disposition: A | Discharge status: Home / Self Care | Payer: Medicare Other | Attending: Vascular Surgery | Admitting: Vascular Surgery

## 2020-03-17 ENCOUNTER — Encounter (HOSPITAL_COMMUNITY)
Admission: RE | Disposition: A | Discharge status: Home / Self Care | Payer: Self-pay | Source: Home / Self Care | Attending: Vascular Surgery

## 2020-03-17 DIAGNOSIS — Z95810 Presence of automatic (implantable) cardiac defibrillator: Secondary | ICD-10-CM | POA: Insufficient documentation

## 2020-03-17 DIAGNOSIS — Y841 Kidney dialysis as the cause of abnormal reaction of the patient, or of later complication, without mention of misadventure at the time of the procedure: Secondary | ICD-10-CM | POA: Diagnosis not present

## 2020-03-17 DIAGNOSIS — Z79899 Other long term (current) drug therapy: Secondary | ICD-10-CM | POA: Diagnosis not present

## 2020-03-17 DIAGNOSIS — E785 Hyperlipidemia, unspecified: Secondary | ICD-10-CM | POA: Diagnosis not present

## 2020-03-17 DIAGNOSIS — D649 Anemia, unspecified: Secondary | ICD-10-CM | POA: Diagnosis not present

## 2020-03-17 DIAGNOSIS — I120 Hypertensive chronic kidney disease with stage 5 chronic kidney disease or end stage renal disease: Secondary | ICD-10-CM | POA: Insufficient documentation

## 2020-03-17 DIAGNOSIS — Z87891 Personal history of nicotine dependence: Secondary | ICD-10-CM | POA: Diagnosis not present

## 2020-03-17 DIAGNOSIS — F209 Schizophrenia, unspecified: Secondary | ICD-10-CM | POA: Insufficient documentation

## 2020-03-17 DIAGNOSIS — E1122 Type 2 diabetes mellitus with diabetic chronic kidney disease: Secondary | ICD-10-CM | POA: Diagnosis not present

## 2020-03-17 DIAGNOSIS — Z992 Dependence on renal dialysis: Secondary | ICD-10-CM | POA: Insufficient documentation

## 2020-03-17 DIAGNOSIS — I429 Cardiomyopathy, unspecified: Secondary | ICD-10-CM | POA: Insufficient documentation

## 2020-03-17 DIAGNOSIS — I4891 Unspecified atrial fibrillation: Secondary | ICD-10-CM | POA: Diagnosis not present

## 2020-03-17 DIAGNOSIS — N185 Chronic kidney disease, stage 5: Secondary | ICD-10-CM | POA: Insufficient documentation

## 2020-03-17 DIAGNOSIS — T82510A Breakdown (mechanical) of surgically created arteriovenous fistula, initial encounter: Secondary | ICD-10-CM | POA: Diagnosis not present

## 2020-03-17 DIAGNOSIS — T82898A Other specified complication of vascular prosthetic devices, implants and grafts, initial encounter: Secondary | ICD-10-CM | POA: Diagnosis not present

## 2020-03-17 DIAGNOSIS — N186 End stage renal disease: Secondary | ICD-10-CM | POA: Diagnosis not present

## 2020-03-17 HISTORY — PX: A/V FISTULAGRAM: CATH118298

## 2020-03-17 LAB — POCT I-STAT, CHEM 8
BUN: 30 mg/dL — ABNORMAL HIGH (ref 6–20)
Calcium, Ion: 1.14 mmol/L — ABNORMAL LOW (ref 1.15–1.40)
Chloride: 95 mmol/L — ABNORMAL LOW (ref 98–111)
Creatinine, Ser: 8.1 mg/dL — ABNORMAL HIGH (ref 0.61–1.24)
Glucose, Bld: 75 mg/dL (ref 70–99)
HCT: 33 % — ABNORMAL LOW (ref 39.0–52.0)
Hemoglobin: 11.2 g/dL — ABNORMAL LOW (ref 13.0–17.0)
Potassium: 4.7 mmol/L (ref 3.5–5.1)
Sodium: 136 mmol/L (ref 135–145)
TCO2: 28 mmol/L (ref 22–32)

## 2020-03-17 SURGERY — A/V FISTULAGRAM
Anesthesia: LOCAL | Laterality: Right

## 2020-03-17 MED ORDER — SODIUM CHLORIDE 0.9 % IV SOLN: 250.0000 mL | INTRAVENOUS | Status: DC | PRN

## 2020-03-17 MED ORDER — SODIUM CHLORIDE 0.9% FLUSH: 3.0000 mL | INTRAVENOUS | Status: DC | PRN

## 2020-03-17 MED ORDER — HEPARIN (PORCINE) IN NACL 1000-0.9 UT/500ML-% IV SOLN
INTRAVENOUS | Status: AC
  Filled 2020-03-17: qty 500

## 2020-03-17 MED ORDER — SODIUM CHLORIDE 0.9% FLUSH: 3.0000 mL | Freq: Two times a day (BID) | INTRAVENOUS | Status: DC

## 2020-03-17 MED ORDER — LIDOCAINE HCL (PF) 1 % IJ SOLN
INTRAMUSCULAR | Status: AC
  Filled 2020-03-17: qty 30

## 2020-03-17 MED ORDER — IODIXANOL 320 MG/ML IV SOLN
INTRAVENOUS | Status: DC | PRN
  Administered 2020-03-17: 30 mL

## 2020-03-17 MED ORDER — HEPARIN (PORCINE) IN NACL 1000-0.9 UT/500ML-% IV SOLN
INTRAVENOUS | Status: DC | PRN
  Administered 2020-03-17: 500 mL

## 2020-03-17 MED ORDER — LIDOCAINE HCL (PF) 1 % IJ SOLN
INTRAMUSCULAR | Status: DC | PRN
  Administered 2020-03-17: 5 mL

## 2020-03-17 SURGICAL SUPPLY — 8 items
COVER DOME SNAP 22 D (MISCELLANEOUS) ×2 IMPLANT
KIT MICROPUNCTURE NIT STIFF (SHEATH) ×2 IMPLANT
PROTECTION STATION PRESSURIZED (MISCELLANEOUS) ×2
SHEATH PROBE COVER 6X72 (BAG) ×4 IMPLANT
STATION PROTECTION PRESSURIZED (MISCELLANEOUS) ×1 IMPLANT
STOPCOCK MORSE 400PSI 3WAY (MISCELLANEOUS) ×2 IMPLANT
TRAY PV CATH (CUSTOM PROCEDURE TRAY) ×2 IMPLANT
TUBING CIL FLEX 10 FLL-RA (TUBING) ×2 IMPLANT

## 2020-03-17 NOTE — Discharge Instructions (Signed)

## 2020-03-17 NOTE — Op Note (Signed)
   PATIENT: James Nicholson      MRN: [id number] DOB: Apr 15, 1966    DATE OF PROCEDURE: 03/17/2020  INDICATIONS:    James Nicholson is a 54 y.o. male who presents for a fistulogram.  He has a large aneurysmal fistula and we plan to do staged plication of 2 large aneurysms but he had a pulsatile fistula suggesting an outflow obstruction and presents for a fistulogram.  PROCEDURE:    1.  Ultrasound-guided access to right radiocephalic AV fistula 2.  Fistulogram right radiocephalic AV fistula  SURGEON: Judeth Cornfield. Scot Dock, MD, FACS  ANESTHESIA: Local  EBL: Minimal  TECHNIQUE: The patient was brought to the peripheral vascular lab.  The right arm was prepped and draped in the usual sterile fashion.  Under ultrasound guidance, after the skin was anesthetized, I cannulated the proximal fistula with a micropuncture needle and a micropuncture sheath was introduced over a wire.  By ultrasound the fistula did not have significant thrombus at this level.  A real-time image was sent to the server.  A fistulogram as an performed to evaluate from the point of cannulation to include the central veins.  In addition the fistula was compressed for a retrograde shot to evaluate the proximal fistula.  At the completion a 4-0 Monocryl was placed around the cannulation site and there was good hemostasis.  FINDINGS:   1.  Widely patent right radiocephalic AV fistula. 2.  No central venous stenosis noted. 3.  The upper arm cephalic vein and appears to be occluded in the forearm fistula empties into the brachial system.  There is no focal stenosis identified. 4.  The proximal fistula is patent without significant stenosis.  CLINICAL NOTE: The patient will be scheduled for staged plication of the 2 large aneurysms in his right forearm fistula.  The more central aneurysm will be addressed first.  We will schedule this on a nondialysis day.  He dialyzes on Tuesdays Thursdays and Saturdays.  Deitra Mayo, MD, FACS [facility]  DATE OF DICTATION:   03/17/2020

## 2020-03-17 NOTE — Interval H&P Note (Signed)
History and Physical Interval Note:  03/17/2020 11:32 AM  James Nicholson  has presented today for surgery, with the diagnosis of end stage renal.  The various methods of treatment have been discussed with the patient and family. After consideration of risks, benefits and other options for treatment, the patient has consented to  Procedure(s): A/V FISTULAGRAM (Right) as a surgical intervention.  The patient's history has been reviewed, patient examined, no change in status, stable for surgery.  I have reviewed the patient's chart and labs.  Questions were answered to the patient's satisfaction.     Deitra Mayo

## 2020-03-18 DIAGNOSIS — D509 Iron deficiency anemia, unspecified: Secondary | ICD-10-CM | POA: Diagnosis not present

## 2020-03-18 DIAGNOSIS — N2581 Secondary hyperparathyroidism of renal origin: Secondary | ICD-10-CM | POA: Diagnosis not present

## 2020-03-18 DIAGNOSIS — D631 Anemia in chronic kidney disease: Secondary | ICD-10-CM | POA: Diagnosis not present

## 2020-03-18 DIAGNOSIS — E1122 Type 2 diabetes mellitus with diabetic chronic kidney disease: Secondary | ICD-10-CM | POA: Diagnosis not present

## 2020-03-18 DIAGNOSIS — Z992 Dependence on renal dialysis: Secondary | ICD-10-CM | POA: Diagnosis not present

## 2020-03-18 DIAGNOSIS — N186 End stage renal disease: Secondary | ICD-10-CM | POA: Diagnosis not present

## 2020-03-21 DIAGNOSIS — D509 Iron deficiency anemia, unspecified: Secondary | ICD-10-CM | POA: Diagnosis not present

## 2020-03-21 DIAGNOSIS — E1122 Type 2 diabetes mellitus with diabetic chronic kidney disease: Secondary | ICD-10-CM | POA: Diagnosis not present

## 2020-03-21 DIAGNOSIS — N186 End stage renal disease: Secondary | ICD-10-CM | POA: Diagnosis not present

## 2020-03-21 DIAGNOSIS — D631 Anemia in chronic kidney disease: Secondary | ICD-10-CM | POA: Diagnosis not present

## 2020-03-21 DIAGNOSIS — N2581 Secondary hyperparathyroidism of renal origin: Secondary | ICD-10-CM | POA: Diagnosis not present

## 2020-03-21 DIAGNOSIS — Z992 Dependence on renal dialysis: Secondary | ICD-10-CM | POA: Diagnosis not present

## 2020-03-23 DIAGNOSIS — N2581 Secondary hyperparathyroidism of renal origin: Secondary | ICD-10-CM | POA: Diagnosis not present

## 2020-03-23 DIAGNOSIS — E1122 Type 2 diabetes mellitus with diabetic chronic kidney disease: Secondary | ICD-10-CM | POA: Diagnosis not present

## 2020-03-23 DIAGNOSIS — N186 End stage renal disease: Secondary | ICD-10-CM | POA: Diagnosis not present

## 2020-03-23 DIAGNOSIS — D509 Iron deficiency anemia, unspecified: Secondary | ICD-10-CM | POA: Diagnosis not present

## 2020-03-23 DIAGNOSIS — Z992 Dependence on renal dialysis: Secondary | ICD-10-CM | POA: Diagnosis not present

## 2020-03-23 DIAGNOSIS — D631 Anemia in chronic kidney disease: Secondary | ICD-10-CM | POA: Diagnosis not present

## 2020-03-25 DIAGNOSIS — N186 End stage renal disease: Secondary | ICD-10-CM | POA: Diagnosis not present

## 2020-03-25 DIAGNOSIS — N2581 Secondary hyperparathyroidism of renal origin: Secondary | ICD-10-CM | POA: Diagnosis not present

## 2020-03-25 DIAGNOSIS — Z992 Dependence on renal dialysis: Secondary | ICD-10-CM | POA: Diagnosis not present

## 2020-03-25 DIAGNOSIS — D509 Iron deficiency anemia, unspecified: Secondary | ICD-10-CM | POA: Diagnosis not present

## 2020-03-25 DIAGNOSIS — D631 Anemia in chronic kidney disease: Secondary | ICD-10-CM | POA: Diagnosis not present

## 2020-03-25 DIAGNOSIS — E1122 Type 2 diabetes mellitus with diabetic chronic kidney disease: Secondary | ICD-10-CM | POA: Diagnosis not present

## 2020-03-28 DIAGNOSIS — N186 End stage renal disease: Secondary | ICD-10-CM | POA: Diagnosis not present

## 2020-03-28 DIAGNOSIS — N032 Chronic nephritic syndrome with diffuse membranous glomerulonephritis: Secondary | ICD-10-CM | POA: Diagnosis not present

## 2020-03-28 DIAGNOSIS — D631 Anemia in chronic kidney disease: Secondary | ICD-10-CM | POA: Diagnosis not present

## 2020-03-28 DIAGNOSIS — D509 Iron deficiency anemia, unspecified: Secondary | ICD-10-CM | POA: Diagnosis not present

## 2020-03-28 DIAGNOSIS — Z992 Dependence on renal dialysis: Secondary | ICD-10-CM | POA: Diagnosis not present

## 2020-03-28 DIAGNOSIS — N2581 Secondary hyperparathyroidism of renal origin: Secondary | ICD-10-CM | POA: Diagnosis not present

## 2020-03-28 DIAGNOSIS — E1122 Type 2 diabetes mellitus with diabetic chronic kidney disease: Secondary | ICD-10-CM | POA: Diagnosis not present

## 2020-03-30 DIAGNOSIS — Z992 Dependence on renal dialysis: Secondary | ICD-10-CM | POA: Diagnosis not present

## 2020-03-30 DIAGNOSIS — E1122 Type 2 diabetes mellitus with diabetic chronic kidney disease: Secondary | ICD-10-CM | POA: Diagnosis not present

## 2020-03-30 DIAGNOSIS — D509 Iron deficiency anemia, unspecified: Secondary | ICD-10-CM | POA: Diagnosis not present

## 2020-03-30 DIAGNOSIS — N2581 Secondary hyperparathyroidism of renal origin: Secondary | ICD-10-CM | POA: Diagnosis not present

## 2020-03-30 DIAGNOSIS — D631 Anemia in chronic kidney disease: Secondary | ICD-10-CM | POA: Diagnosis not present

## 2020-03-30 DIAGNOSIS — N186 End stage renal disease: Secondary | ICD-10-CM | POA: Diagnosis not present

## 2020-04-01 DIAGNOSIS — N2581 Secondary hyperparathyroidism of renal origin: Secondary | ICD-10-CM | POA: Diagnosis not present

## 2020-04-01 DIAGNOSIS — Z992 Dependence on renal dialysis: Secondary | ICD-10-CM | POA: Diagnosis not present

## 2020-04-01 DIAGNOSIS — D509 Iron deficiency anemia, unspecified: Secondary | ICD-10-CM | POA: Diagnosis not present

## 2020-04-01 DIAGNOSIS — N186 End stage renal disease: Secondary | ICD-10-CM | POA: Diagnosis not present

## 2020-04-01 DIAGNOSIS — E1122 Type 2 diabetes mellitus with diabetic chronic kidney disease: Secondary | ICD-10-CM | POA: Diagnosis not present

## 2020-04-01 DIAGNOSIS — D631 Anemia in chronic kidney disease: Secondary | ICD-10-CM | POA: Diagnosis not present

## 2020-04-04 DIAGNOSIS — D631 Anemia in chronic kidney disease: Secondary | ICD-10-CM | POA: Diagnosis not present

## 2020-04-04 DIAGNOSIS — E1122 Type 2 diabetes mellitus with diabetic chronic kidney disease: Secondary | ICD-10-CM | POA: Diagnosis not present

## 2020-04-04 DIAGNOSIS — Z992 Dependence on renal dialysis: Secondary | ICD-10-CM | POA: Diagnosis not present

## 2020-04-04 DIAGNOSIS — N2581 Secondary hyperparathyroidism of renal origin: Secondary | ICD-10-CM | POA: Diagnosis not present

## 2020-04-04 DIAGNOSIS — N186 End stage renal disease: Secondary | ICD-10-CM | POA: Diagnosis not present

## 2020-04-04 DIAGNOSIS — D509 Iron deficiency anemia, unspecified: Secondary | ICD-10-CM | POA: Diagnosis not present

## 2020-04-06 DIAGNOSIS — D631 Anemia in chronic kidney disease: Secondary | ICD-10-CM | POA: Diagnosis not present

## 2020-04-06 DIAGNOSIS — E1122 Type 2 diabetes mellitus with diabetic chronic kidney disease: Secondary | ICD-10-CM | POA: Diagnosis not present

## 2020-04-06 DIAGNOSIS — N186 End stage renal disease: Secondary | ICD-10-CM | POA: Diagnosis not present

## 2020-04-06 DIAGNOSIS — Z992 Dependence on renal dialysis: Secondary | ICD-10-CM | POA: Diagnosis not present

## 2020-04-06 DIAGNOSIS — D509 Iron deficiency anemia, unspecified: Secondary | ICD-10-CM | POA: Diagnosis not present

## 2020-04-06 DIAGNOSIS — N2581 Secondary hyperparathyroidism of renal origin: Secondary | ICD-10-CM | POA: Diagnosis not present

## 2020-04-08 DIAGNOSIS — N186 End stage renal disease: Secondary | ICD-10-CM | POA: Diagnosis not present

## 2020-04-08 DIAGNOSIS — E1122 Type 2 diabetes mellitus with diabetic chronic kidney disease: Secondary | ICD-10-CM | POA: Diagnosis not present

## 2020-04-08 DIAGNOSIS — N2581 Secondary hyperparathyroidism of renal origin: Secondary | ICD-10-CM | POA: Diagnosis not present

## 2020-04-08 DIAGNOSIS — D509 Iron deficiency anemia, unspecified: Secondary | ICD-10-CM | POA: Diagnosis not present

## 2020-04-08 DIAGNOSIS — Z992 Dependence on renal dialysis: Secondary | ICD-10-CM | POA: Diagnosis not present

## 2020-04-08 DIAGNOSIS — D631 Anemia in chronic kidney disease: Secondary | ICD-10-CM | POA: Diagnosis not present

## 2020-04-11 DIAGNOSIS — E1122 Type 2 diabetes mellitus with diabetic chronic kidney disease: Secondary | ICD-10-CM | POA: Diagnosis not present

## 2020-04-11 DIAGNOSIS — N2581 Secondary hyperparathyroidism of renal origin: Secondary | ICD-10-CM | POA: Diagnosis not present

## 2020-04-11 DIAGNOSIS — Z992 Dependence on renal dialysis: Secondary | ICD-10-CM | POA: Diagnosis not present

## 2020-04-11 DIAGNOSIS — N186 End stage renal disease: Secondary | ICD-10-CM | POA: Diagnosis not present

## 2020-04-11 DIAGNOSIS — D631 Anemia in chronic kidney disease: Secondary | ICD-10-CM | POA: Diagnosis not present

## 2020-04-11 DIAGNOSIS — D509 Iron deficiency anemia, unspecified: Secondary | ICD-10-CM | POA: Diagnosis not present

## 2020-04-13 ENCOUNTER — Ambulatory Visit (INDEPENDENT_AMBULATORY_CARE_PROVIDER_SITE_OTHER): Payer: Medicare Other | Admitting: *Deleted

## 2020-04-13 DIAGNOSIS — D509 Iron deficiency anemia, unspecified: Secondary | ICD-10-CM | POA: Diagnosis not present

## 2020-04-13 DIAGNOSIS — N186 End stage renal disease: Secondary | ICD-10-CM | POA: Diagnosis not present

## 2020-04-13 DIAGNOSIS — D631 Anemia in chronic kidney disease: Secondary | ICD-10-CM | POA: Diagnosis not present

## 2020-04-13 DIAGNOSIS — I255 Ischemic cardiomyopathy: Secondary | ICD-10-CM | POA: Diagnosis not present

## 2020-04-13 DIAGNOSIS — N2581 Secondary hyperparathyroidism of renal origin: Secondary | ICD-10-CM | POA: Diagnosis not present

## 2020-04-13 DIAGNOSIS — Z992 Dependence on renal dialysis: Secondary | ICD-10-CM | POA: Diagnosis not present

## 2020-04-13 DIAGNOSIS — I5022 Chronic systolic (congestive) heart failure: Secondary | ICD-10-CM

## 2020-04-13 DIAGNOSIS — E1122 Type 2 diabetes mellitus with diabetic chronic kidney disease: Secondary | ICD-10-CM | POA: Diagnosis not present

## 2020-04-13 LAB — CUP PACEART REMOTE DEVICE CHECK
Battery Remaining Longevity: 107 mo
Battery Voltage: 3 V
Brady Statistic RV Percent Paced: 0 %
Date Time Interrogation Session: 20210617153628
HighPow Impedance: 254 Ohm
HighPow Impedance: 41 Ohm
Implantable Lead Implant Date: 20210315
Implantable Lead Location: 753860
Implantable Lead Model: 6935
Implantable Pulse Generator Implant Date: 20190208
Lead Channel Impedance Value: 304 Ohm
Lead Channel Impedance Value: 399 Ohm
Lead Channel Pacing Threshold Amplitude: 0.875 V
Lead Channel Pacing Threshold Pulse Width: 0.4 ms
Lead Channel Sensing Intrinsic Amplitude: 8.75 mV
Lead Channel Sensing Intrinsic Amplitude: 8.75 mV
Lead Channel Setting Pacing Amplitude: 3 V
Lead Channel Setting Pacing Pulse Width: 0.4 ms
Lead Channel Setting Sensing Sensitivity: 0.3 mV

## 2020-04-14 ENCOUNTER — Other Ambulatory Visit: Payer: Self-pay

## 2020-04-14 NOTE — Progress Notes (Signed)
Remote ICD transmission.   

## 2020-04-15 DIAGNOSIS — E1122 Type 2 diabetes mellitus with diabetic chronic kidney disease: Secondary | ICD-10-CM | POA: Diagnosis not present

## 2020-04-15 DIAGNOSIS — N186 End stage renal disease: Secondary | ICD-10-CM | POA: Diagnosis not present

## 2020-04-15 DIAGNOSIS — D509 Iron deficiency anemia, unspecified: Secondary | ICD-10-CM | POA: Diagnosis not present

## 2020-04-15 DIAGNOSIS — D631 Anemia in chronic kidney disease: Secondary | ICD-10-CM | POA: Diagnosis not present

## 2020-04-15 DIAGNOSIS — Z992 Dependence on renal dialysis: Secondary | ICD-10-CM | POA: Diagnosis not present

## 2020-04-15 DIAGNOSIS — N2581 Secondary hyperparathyroidism of renal origin: Secondary | ICD-10-CM | POA: Diagnosis not present

## 2020-04-18 DIAGNOSIS — N2581 Secondary hyperparathyroidism of renal origin: Secondary | ICD-10-CM | POA: Diagnosis not present

## 2020-04-18 DIAGNOSIS — N186 End stage renal disease: Secondary | ICD-10-CM | POA: Diagnosis not present

## 2020-04-18 DIAGNOSIS — E1122 Type 2 diabetes mellitus with diabetic chronic kidney disease: Secondary | ICD-10-CM | POA: Diagnosis not present

## 2020-04-18 DIAGNOSIS — Z992 Dependence on renal dialysis: Secondary | ICD-10-CM | POA: Diagnosis not present

## 2020-04-18 DIAGNOSIS — D631 Anemia in chronic kidney disease: Secondary | ICD-10-CM | POA: Diagnosis not present

## 2020-04-18 DIAGNOSIS — D509 Iron deficiency anemia, unspecified: Secondary | ICD-10-CM | POA: Diagnosis not present

## 2020-04-20 DIAGNOSIS — E1122 Type 2 diabetes mellitus with diabetic chronic kidney disease: Secondary | ICD-10-CM | POA: Diagnosis not present

## 2020-04-20 DIAGNOSIS — D631 Anemia in chronic kidney disease: Secondary | ICD-10-CM | POA: Diagnosis not present

## 2020-04-20 DIAGNOSIS — D509 Iron deficiency anemia, unspecified: Secondary | ICD-10-CM | POA: Diagnosis not present

## 2020-04-20 DIAGNOSIS — N186 End stage renal disease: Secondary | ICD-10-CM | POA: Diagnosis not present

## 2020-04-20 DIAGNOSIS — Z992 Dependence on renal dialysis: Secondary | ICD-10-CM | POA: Diagnosis not present

## 2020-04-20 DIAGNOSIS — N2581 Secondary hyperparathyroidism of renal origin: Secondary | ICD-10-CM | POA: Diagnosis not present

## 2020-04-22 DIAGNOSIS — D631 Anemia in chronic kidney disease: Secondary | ICD-10-CM | POA: Diagnosis not present

## 2020-04-22 DIAGNOSIS — N186 End stage renal disease: Secondary | ICD-10-CM | POA: Diagnosis not present

## 2020-04-22 DIAGNOSIS — D509 Iron deficiency anemia, unspecified: Secondary | ICD-10-CM | POA: Diagnosis not present

## 2020-04-22 DIAGNOSIS — N2581 Secondary hyperparathyroidism of renal origin: Secondary | ICD-10-CM | POA: Diagnosis not present

## 2020-04-22 DIAGNOSIS — E1122 Type 2 diabetes mellitus with diabetic chronic kidney disease: Secondary | ICD-10-CM | POA: Diagnosis not present

## 2020-04-22 DIAGNOSIS — Z992 Dependence on renal dialysis: Secondary | ICD-10-CM | POA: Diagnosis not present

## 2020-04-25 ENCOUNTER — Encounter: Payer: Medicare Other | Admitting: Internal Medicine

## 2020-04-25 DIAGNOSIS — N186 End stage renal disease: Secondary | ICD-10-CM | POA: Diagnosis not present

## 2020-04-25 DIAGNOSIS — D509 Iron deficiency anemia, unspecified: Secondary | ICD-10-CM | POA: Diagnosis not present

## 2020-04-25 DIAGNOSIS — D631 Anemia in chronic kidney disease: Secondary | ICD-10-CM | POA: Diagnosis not present

## 2020-04-25 DIAGNOSIS — Z992 Dependence on renal dialysis: Secondary | ICD-10-CM | POA: Diagnosis not present

## 2020-04-25 DIAGNOSIS — N2581 Secondary hyperparathyroidism of renal origin: Secondary | ICD-10-CM | POA: Diagnosis not present

## 2020-04-25 DIAGNOSIS — E1122 Type 2 diabetes mellitus with diabetic chronic kidney disease: Secondary | ICD-10-CM | POA: Diagnosis not present

## 2020-04-27 DIAGNOSIS — D509 Iron deficiency anemia, unspecified: Secondary | ICD-10-CM | POA: Diagnosis not present

## 2020-04-27 DIAGNOSIS — Z992 Dependence on renal dialysis: Secondary | ICD-10-CM | POA: Diagnosis not present

## 2020-04-27 DIAGNOSIS — D631 Anemia in chronic kidney disease: Secondary | ICD-10-CM | POA: Diagnosis not present

## 2020-04-27 DIAGNOSIS — N2581 Secondary hyperparathyroidism of renal origin: Secondary | ICD-10-CM | POA: Diagnosis not present

## 2020-04-27 DIAGNOSIS — N186 End stage renal disease: Secondary | ICD-10-CM | POA: Diagnosis not present

## 2020-04-27 DIAGNOSIS — E1122 Type 2 diabetes mellitus with diabetic chronic kidney disease: Secondary | ICD-10-CM | POA: Diagnosis not present

## 2020-04-27 DIAGNOSIS — N032 Chronic nephritic syndrome with diffuse membranous glomerulonephritis: Secondary | ICD-10-CM | POA: Diagnosis not present

## 2020-04-28 DIAGNOSIS — N186 End stage renal disease: Secondary | ICD-10-CM | POA: Diagnosis not present

## 2020-04-28 DIAGNOSIS — M3131 Wegener's granulomatosis with renal involvement: Secondary | ICD-10-CM | POA: Diagnosis not present

## 2020-04-28 DIAGNOSIS — Z992 Dependence on renal dialysis: Secondary | ICD-10-CM | POA: Diagnosis not present

## 2020-04-28 DIAGNOSIS — R233 Spontaneous ecchymoses: Secondary | ICD-10-CM | POA: Diagnosis not present

## 2020-04-28 DIAGNOSIS — D692 Other nonthrombocytopenic purpura: Secondary | ICD-10-CM | POA: Diagnosis not present

## 2020-04-28 DIAGNOSIS — Z95810 Presence of automatic (implantable) cardiac defibrillator: Secondary | ICD-10-CM | POA: Diagnosis not present

## 2020-04-28 DIAGNOSIS — I120 Hypertensive chronic kidney disease with stage 5 chronic kidney disease or end stage renal disease: Secondary | ICD-10-CM | POA: Diagnosis not present

## 2020-04-28 DIAGNOSIS — E7800 Pure hypercholesterolemia, unspecified: Secondary | ICD-10-CM | POA: Diagnosis not present

## 2020-04-28 DIAGNOSIS — Z79899 Other long term (current) drug therapy: Secondary | ICD-10-CM | POA: Diagnosis not present

## 2020-04-29 DIAGNOSIS — N186 End stage renal disease: Secondary | ICD-10-CM | POA: Diagnosis not present

## 2020-04-29 DIAGNOSIS — D509 Iron deficiency anemia, unspecified: Secondary | ICD-10-CM | POA: Diagnosis not present

## 2020-04-29 DIAGNOSIS — E1122 Type 2 diabetes mellitus with diabetic chronic kidney disease: Secondary | ICD-10-CM | POA: Diagnosis not present

## 2020-04-29 DIAGNOSIS — Z992 Dependence on renal dialysis: Secondary | ICD-10-CM | POA: Diagnosis not present

## 2020-04-29 DIAGNOSIS — D631 Anemia in chronic kidney disease: Secondary | ICD-10-CM | POA: Diagnosis not present

## 2020-04-29 DIAGNOSIS — N2581 Secondary hyperparathyroidism of renal origin: Secondary | ICD-10-CM | POA: Diagnosis not present

## 2020-05-01 ENCOUNTER — Encounter (HOSPITAL_COMMUNITY): Payer: Self-pay

## 2020-05-01 ENCOUNTER — Emergency Department (HOSPITAL_COMMUNITY)
Admission: EM | Admit: 2020-05-01 | Discharge: 2020-05-01 | Disposition: A | Discharge status: Home / Self Care | Payer: Medicare Other | Attending: Emergency Medicine | Admitting: Emergency Medicine

## 2020-05-01 ENCOUNTER — Other Ambulatory Visit: Payer: Self-pay

## 2020-05-01 DIAGNOSIS — I5022 Chronic systolic (congestive) heart failure: Secondary | ICD-10-CM | POA: Insufficient documentation

## 2020-05-01 DIAGNOSIS — M3131 Wegener's granulomatosis with renal involvement: Secondary | ICD-10-CM | POA: Diagnosis not present

## 2020-05-01 DIAGNOSIS — I10 Essential (primary) hypertension: Secondary | ICD-10-CM | POA: Diagnosis not present

## 2020-05-01 DIAGNOSIS — Z992 Dependence on renal dialysis: Secondary | ICD-10-CM | POA: Diagnosis not present

## 2020-05-01 DIAGNOSIS — E1122 Type 2 diabetes mellitus with diabetic chronic kidney disease: Secondary | ICD-10-CM | POA: Diagnosis not present

## 2020-05-01 DIAGNOSIS — E875 Hyperkalemia: Secondary | ICD-10-CM

## 2020-05-01 DIAGNOSIS — I132 Hypertensive heart and chronic kidney disease with heart failure and with stage 5 chronic kidney disease, or end stage renal disease: Secondary | ICD-10-CM | POA: Diagnosis not present

## 2020-05-01 DIAGNOSIS — Z95811 Presence of heart assist device: Secondary | ICD-10-CM | POA: Insufficient documentation

## 2020-05-01 DIAGNOSIS — R21 Rash and other nonspecific skin eruption: Secondary | ICD-10-CM | POA: Diagnosis not present

## 2020-05-01 DIAGNOSIS — E876 Hypokalemia: Secondary | ICD-10-CM | POA: Insufficient documentation

## 2020-05-01 DIAGNOSIS — N186 End stage renal disease: Secondary | ICD-10-CM | POA: Diagnosis not present

## 2020-05-01 DIAGNOSIS — Z87891 Personal history of nicotine dependence: Secondary | ICD-10-CM | POA: Diagnosis not present

## 2020-05-01 LAB — PROTIME-INR
INR: 1.2 (ref 0.8–1.2)
Prothrombin Time: 15 seconds (ref 11.4–15.2)

## 2020-05-01 LAB — CBC WITH DIFFERENTIAL/PLATELET
Abs Immature Granulocytes: 0.04 10*3/uL (ref 0.00–0.07)
Basophils Absolute: 0.1 10*3/uL (ref 0.0–0.1)
Basophils Relative: 1 %
Eosinophils Absolute: 0.7 10*3/uL — ABNORMAL HIGH (ref 0.0–0.5)
Eosinophils Relative: 9 %
HCT: 31.1 % — ABNORMAL LOW (ref 39.0–52.0)
Hemoglobin: 9.2 g/dL — ABNORMAL LOW (ref 13.0–17.0)
Immature Granulocytes: 1 %
Lymphocytes Relative: 21 %
Lymphs Abs: 1.5 10*3/uL (ref 0.7–4.0)
MCH: 26 pg (ref 26.0–34.0)
MCHC: 29.6 g/dL — ABNORMAL LOW (ref 30.0–36.0)
MCV: 87.9 fL (ref 80.0–100.0)
Monocytes Absolute: 0.7 10*3/uL (ref 0.1–1.0)
Monocytes Relative: 10 %
Neutro Abs: 4.3 10*3/uL (ref 1.7–7.7)
Neutrophils Relative %: 58 %
Platelets: 194 10*3/uL (ref 150–400)
RBC: 3.54 MIL/uL — ABNORMAL LOW (ref 4.22–5.81)
RDW: 21 % — ABNORMAL HIGH (ref 11.5–15.5)
WBC: 7.3 10*3/uL (ref 4.0–10.5)
nRBC: 0 % (ref 0.0–0.2)

## 2020-05-01 LAB — COMPREHENSIVE METABOLIC PANEL
ALT: 19 U/L (ref 0–44)
AST: 21 U/L (ref 15–41)
Albumin: 2.7 g/dL — ABNORMAL LOW (ref 3.5–5.0)
Alkaline Phosphatase: 66 U/L (ref 38–126)
Anion gap: 14 (ref 5–15)
BUN: 52 mg/dL — ABNORMAL HIGH (ref 6–20)
CO2: 26 mmol/L (ref 22–32)
Calcium: 9.4 mg/dL (ref 8.9–10.3)
Chloride: 100 mmol/L (ref 98–111)
Creatinine, Ser: 8.5 mg/dL — ABNORMAL HIGH (ref 0.61–1.24)
GFR calc Af Amer: 7 mL/min — ABNORMAL LOW (ref >60)
GFR calc non Af Amer: 6 mL/min — ABNORMAL LOW (ref >60)
Glucose, Bld: 94 mg/dL (ref 70–99)
Potassium: 6.1 mmol/L — ABNORMAL HIGH (ref 3.5–5.1)
Sodium: 140 mmol/L (ref 135–145)
Total Bilirubin: 0.8 mg/dL (ref 0.3–1.2)
Total Protein: 6.8 g/dL (ref 6.5–8.1)

## 2020-05-01 LAB — APTT: aPTT: 33 seconds (ref 24–36)

## 2020-05-01 MED ORDER — LOKELMA 5 G PO PACK: 10.0000 g | PACK | Freq: Every day | ORAL | 0 refills | Status: DC

## 2020-05-01 MED ORDER — SODIUM ZIRCONIUM CYCLOSILICATE 10 G PO PACK
10.0000 g | PACK | Freq: Once | ORAL | Status: AC
  Administered 2020-05-01: 10 g via ORAL
  Filled 2020-05-01: qty 1

## 2020-05-01 NOTE — ED Notes (Signed)
Pt's brother verbalized understanding of discharge instructions. Follow up care and prescriptions reviewed, brother had no further questions.

## 2020-05-01 NOTE — ED Triage Notes (Signed)
Pt accompanied by brother who reports pt has rash to bilateral lower legs for the past 3 weeks. Pt seen at [facility] last week and was told it was related to his kidney failure. Pt c.o itching and some pain to the rash. Last dialysis was Saturday. Pt alert, nad noted

## 2020-05-01 NOTE — ED Notes (Signed)
Paged hematology per Dr. Zenia Resides

## 2020-05-01 NOTE — Discharge Instructions (Addendum)
Take your dose of Lokelma this evening.  Keep your appointment tomorrow at dialysis and informed them that you need referral to a rheumatologist for treatment of your rash

## 2020-05-01 NOTE — ED Provider Notes (Signed)
[facility] EMERGENCY DEPARTMENT Provider Note   CSN: [id number] Arrival date & time: 05/01/20  4098     History Chief Complaint  Patient presents with  . Rash    James Nicholson is a 54 y.o. male.  54 year old male presents with purpuric rash to his left lower extremity.  He has a history of end-stage renal disease secondary to Wegener's granulomatosis and is on dialysis and was last dialyzed yesterday.  His brother states that the rash is been of several weeks but has gotten worse recently.  It has been localized to his lower extremities the brother says.  Denies any easy bruising.  No recent history of blood loss.  No treatment used for this prior to arrival        Past Medical History:  Diagnosis Date  . Anemia   . Atrial fibrillation   . Cardiomyopathy ([place]) 2010   Unclear Etiology: Last Echo 06/2009: EF 40-45%, severe Lateral & apical Hypokinesis (? CAD) ; Grade 2 DDysfxn. Mild conc LVH.   Marland Kitchen Cellulitis   . Chronic kidney disease (CKD), stage V ([place])    Dialysis Tue, Thurs, Sat  . Dyslipidemia   . Enterobacter sepsis ([place])   . Hypertension   . IDDM (insulin dependent diabetes mellitus) 11/03/2014  . Pneumonia   . S/P ICD (internal cardiac defibrillator) procedure 2010   VT Arrest (in [place])  . Schizophrenia ([place])   . 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 Wegener's granulomatosis [facility])     Patient Active Problem List   Diagnosis Date Noted  . Chronic systolic CHF (congestive heart failure) ([place]) 01/08/2020  . Chest pain 01/07/2020  . ICD (implantable cardioverter-defibrillator) lead failure 12/31/2019  . Eye pain 12/16/2018  . Wegener's granulomatosis ([place]) 12/15/2018  . Eye pain, right 12/15/2018  . Type II diabetes mellitus with renal manifestations ([place]) 12/15/2018  . GERD (gastroesophageal reflux disease) 12/15/2018  . Orbital mass 10/08/2018  . Sinusitis 10/08/2018  . Redness of right eye 10/08/2018  . Ocular proptosis  10/08/2018  . Pulmonary nodule, left 10/08/2018  . Cough   . Lung nodules--left upper lobe and left posterior lobe- need to rule out malignancy 10/05/2018  . Conjunctivitis 09/27/2018  . Chronic kidney disease   . Hyperlipidemia   . Sepsis ([place]) 11/03/2014  . IDDM (insulin dependent diabetes mellitus) ([place]) 11/03/2014  . Troponin level elevated 12/22/2013    Class: Acute  . Mechanical complication of other vascular device, implant, and graft 08/04/2013  . Other complications due to renal dialysis device, implant, and graft 08/04/2013  . ESRD on dialysis ([place]) 04/13/2013  . Anemia in chronic kidney disease 02/05/2013  . Pneumonia 02/05/2013  . GI bleed 02/05/2013  . Metabolic acidosis 11/91/4782  . 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 PPD positive - completed 9 months INH (per Dr. Linus Salmons) 05/25/2012  . Other primary cardiomyopathies 05/07/2012  . H/o Ventricular fibrillation Arrest now with AICD 05/07/2012    Class: History of  . Hypertension 05/07/2012  . Automatic implantable cardioverter-defibrillator in situ 05/07/2012    Past Surgical History:  Procedure Laterality Date  . A/V FISTULAGRAM Right 03/17/2020   Procedure: A/V FISTULAGRAM;  Surgeon: Angelia Mould, MD;  Location: [place] CV LAB;  Service: Cardiovascular;  Laterality: Right;  . AV FISTULA PLACEMENT Right 02/10/2013   Procedure: ARTERIOVENOUS (AV) FISTULA CREATION;  Surgeon: Rosetta Posner, MD;  Location: [facility] OR;  Service: Vascular;  Laterality: Right;  Right forearm radial/cephalic arterovenous fistula.   Marland Kitchen  CARDIAC CATHETERIZATION  2010   [place]: In setting of VT arrest. Per brother's report, nonobstructive with no intervention  . CARDIAC DEFIBRILLATOR PLACEMENT  2010   [place]  . FISTULOGRAM Right 08/09/2013   Procedure: FISTULOGRAM;  Surgeon: Angelia Mould, MD;  [facility] LAB;  Service: Cardiovascular;  Laterality: Right;  . ICD GENERATOR CHANGEOUT N/A 12/05/2017   Procedure:  ICD GENERATOR CHANGEOUT;  Surgeon: Evans Lance, MD;  Location: [place] CV LAB;  Service: Cardiovascular;  Laterality: N/A;  . ICD LEAD REMOVAL N/A 01/10/2020   Procedure: ICD LEAD REMOVAL AND IMPLANTATION OF NEW LEAD IN RIGHT VENTRICLE;  Surgeon: Evans Lance, MD;  Location: [facility] OR;  Service: Cardiovascular;  Laterality: N/A;  . LIGATION OF COMPETING BRANCHES OF ARTERIOVENOUS FISTULA Right 08/13/2013   Procedure: LIGATION OF COMPETING BRANCHES X5 OF ARTERIOVENOUS FISTULA- RIGHT ARM;  Surgeon: Serafina Mitchell, MD;  Location: [place];  Service: Vascular;  Laterality: Right;  . TEE WITHOUT CARDIOVERSION N/A 01/10/2020   Procedure: TRANSESOPHAGEAL ECHOCARDIOGRAM (TEE);  Surgeon: Evans Lance, MD;  Location: [facility] OR;  Service: Cardiovascular;  Laterality: N/A;       Family History  Problem Relation Age of Onset  . CAD Mother     Social History   Tobacco Use  . Smoking status: Former Smoker    Quit date: 10/28/2008    Years since quitting: 11.5  . Smokeless tobacco: Never Used  Vaping Use  . Vaping Use: Never used  Substance Use Topics  . Alcohol use: No  . Drug use: No    Home Medications Prior to Admission medications   Medication Sig Start Date End Date Taking? Authorizing Provider  atorvastatin (LIPITOR) 20 MG tablet Take 1 tablet (20 mg total) by mouth daily. Patient taking differently: Take 20 mg by mouth at bedtime.  12/31/19  Yes Evans Lance, MD  AURYXIA 1 GM 210 MG(Fe) tablet Take 210 mg by mouth 3 (three) times daily.  11/24/19  Yes [provider]  metoprolol succinate (TOPROL-XL) 50 MG 24 hr tablet Take 1 tablet (50 mg total) by mouth daily. Patient taking differently: Take 50 mg by mouth at bedtime.  12/31/19  Yes Evans Lance, MD    Allergies    Patient has no known allergies.  Review of Systems   Review of Systems  All other systems reviewed and are negative.   Physical Exam Updated Vital Signs BP (!) 154/91 (BP Location: Left Arm)   Pulse  (!) 57   Temp 98.4 F (36.9 C) (Oral)   Resp 16   Ht 1.753 m (5\' 9" )   Wt 73 kg   SpO2 99%   BMI 23.77 kg/m   Physical Exam Vitals and nursing note reviewed.  Constitutional:      General: He is not in acute distress.    Appearance: Normal appearance. He is well-developed. He is not toxic-appearing.  HENT:     Head: Normocephalic and atraumatic.  Eyes:     General: Lids are normal.     Conjunctiva/sclera: Conjunctivae normal.     Pupils: Pupils are equal, round, and reactive to light.  Neck:     Thyroid: No thyroid mass.     Trachea: No tracheal deviation.  Cardiovascular:     Rate and Rhythm: Normal rate and regular rhythm.     Heart sounds: Normal heart sounds. No murmur heard.  No gallop.   Pulmonary:     Effort: Pulmonary effort is normal. No respiratory  distress.     Breath sounds: Normal breath sounds. No stridor. No decreased breath sounds, wheezing, rhonchi or rales.  Abdominal:     General: Bowel sounds are normal. There is no distension.     Palpations: Abdomen is soft.     Tenderness: There is no abdominal tenderness. There is no rebound.  Musculoskeletal:        General: No tenderness. Normal range of motion.     Cervical back: Normal range of motion and neck supple.  Skin:    General: Skin is warm and dry.     Findings: Petechiae and rash present. No abrasion. Rash is purpuric.     Comments: Petechiae and purpura noted to lower extremities bilateral worse on the left side with the left having palpable proper  Neurological:     Mental Status: He is alert and oriented to person, place, and time.     GCS: GCS eye subscore is 4. GCS verbal subscore is 5. GCS motor subscore is 6.     Cranial Nerves: No cranial nerve deficit.     Sensory: No sensory deficit.  Psychiatric:        Speech: Speech normal.        Behavior: Behavior normal.     ED Results / Procedures / Treatments   Labs (all labs ordered are listed, but only abnormal results are  displayed) Labs Reviewed  CBC WITH DIFFERENTIAL/PLATELET  COMPREHENSIVE METABOLIC PANEL  PROTIME-INR  APTT    EKG None  Radiology No results found.  Procedures Procedures (including critical care time)  Medications Ordered in ED Medications - No data to display  ED Course  I have reviewed the triage vital signs and the nursing notes.  Pertinent labs & imaging results that were available during my care of the patient were reviewed by me and considered in my medical decision making (see chart for details).    MDM Rules/Calculators/A&P                          Patient's platelets here are stable.  Does have hyperkalemia that was treated with Lokelma.  Purpura likely is a result of his underlying Wegener's.  Case discussed with nephrology who recommends hematology consultation.  Discussed hematology states that patient needs rheumatology.  Patient does not have a rheumatologist and I have informed him that tomorrow when he goes to dialysis that he will need to be referred to 1.  Per request of nephrology, will order.  Prescription additional dose of Lokelma for this evening.  CRITICAL CARE Performed by: Leota Jacobsen Total critical care time: 45 minutes Critical care time was exclusive of separately billable procedures and treating other patients. Critical care was necessary to treat or prevent imminent or life-threatening deterioration. Critical care was time spent personally by me on the following activities: development of treatment plan with patient and/or surrogate as well as nursing, discussions with consultants, evaluation of patient's response to treatment, examination of patient, obtaining history from patient or surrogate, ordering and performing treatments and interventions, ordering and review of laboratory studies, ordering and review of radiographic studies, pulse oximetry and re-evaluation of patient's condition.  Final Clinical Impression(s) / ED Diagnoses Final  diagnoses:  None    Rx / DC Orders ED Discharge Orders    None       Lacretia Leigh, MD 05/01/20 1314

## 2020-05-02 DIAGNOSIS — N2581 Secondary hyperparathyroidism of renal origin: Secondary | ICD-10-CM | POA: Diagnosis not present

## 2020-05-02 DIAGNOSIS — N186 End stage renal disease: Secondary | ICD-10-CM | POA: Diagnosis not present

## 2020-05-02 DIAGNOSIS — D631 Anemia in chronic kidney disease: Secondary | ICD-10-CM | POA: Diagnosis not present

## 2020-05-02 DIAGNOSIS — D509 Iron deficiency anemia, unspecified: Secondary | ICD-10-CM | POA: Diagnosis not present

## 2020-05-02 DIAGNOSIS — E1122 Type 2 diabetes mellitus with diabetic chronic kidney disease: Secondary | ICD-10-CM | POA: Diagnosis not present

## 2020-05-02 DIAGNOSIS — Z992 Dependence on renal dialysis: Secondary | ICD-10-CM | POA: Diagnosis not present

## 2020-05-04 DIAGNOSIS — E1122 Type 2 diabetes mellitus with diabetic chronic kidney disease: Secondary | ICD-10-CM | POA: Diagnosis not present

## 2020-05-04 DIAGNOSIS — D509 Iron deficiency anemia, unspecified: Secondary | ICD-10-CM | POA: Diagnosis not present

## 2020-05-04 DIAGNOSIS — N186 End stage renal disease: Secondary | ICD-10-CM | POA: Diagnosis not present

## 2020-05-04 DIAGNOSIS — N2581 Secondary hyperparathyroidism of renal origin: Secondary | ICD-10-CM | POA: Diagnosis not present

## 2020-05-04 DIAGNOSIS — Z992 Dependence on renal dialysis: Secondary | ICD-10-CM | POA: Diagnosis not present

## 2020-05-04 DIAGNOSIS — D631 Anemia in chronic kidney disease: Secondary | ICD-10-CM | POA: Diagnosis not present

## 2020-05-05 ENCOUNTER — Encounter: Payer: Self-pay | Admitting: Internal Medicine

## 2020-05-05 ENCOUNTER — Encounter (HOSPITAL_COMMUNITY): Payer: Self-pay | Admitting: Vascular Surgery

## 2020-05-05 ENCOUNTER — Other Ambulatory Visit (HOSPITAL_COMMUNITY)
Admission: RE | Admit: 2020-05-05 | Discharge: 2020-05-05 | Disposition: A | Discharge status: Home / Self Care | Payer: Medicare Other | Source: Ambulatory Visit | Attending: Vascular Surgery | Admitting: Vascular Surgery

## 2020-05-05 DIAGNOSIS — Z01812 Encounter for preprocedural laboratory examination: Secondary | ICD-10-CM | POA: Insufficient documentation

## 2020-05-05 DIAGNOSIS — Z20822 Contact with and (suspected) exposure to covid-19: Secondary | ICD-10-CM | POA: Diagnosis not present

## 2020-05-05 LAB — SARS CORONAVIRUS 2 (TAT 6-24 HRS): SARS Coronavirus 2: NEGATIVE

## 2020-05-05 NOTE — Anesthesia Preprocedure Evaluation (Addendum)
Anesthesia Evaluation  Patient identified by MRN, date of birth, ID band Patient awake    Reviewed: Allergy & Precautions, NPO status , Patient's Chart, lab work & pertinent test results  Airway Mallampati: I  TM Distance: >3 FB Neck ROM: Full    Dental  (+) Chipped, Missing, Dental Advisory Given,    Pulmonary former smoker,    Pulmonary exam normal        Cardiovascular hypertension, Pt. on home beta blockers +CHF  Normal cardiovascular exam+ Cardiac Defibrillator   Nuclear stress tet 01/10/20: IMPRESSION: 1. Large fixed defect along the anterolateral wall the left ventricle, raising concern for prior infarction. No reversible ischemia. 2. Mild global hypokinesis with associated left ventricular dilatation. 3. Left ventricular ejection fraction 45% 4. Non invasive risk stratification*: High  Echo 01/09/20: IMPRESSIONS  1. Severe biventricular failure.  2. Left ventricular ejection fraction, by estimation, is 30 to 35%. The  left ventricle has moderately decreased function. The left ventricle has  no regional wall motion abnormalities. The left ventricular internal  cavity size was moderately dilated.  Left ventricular diastolic parameters are consistent with Grade II  diastolic dysfunction (pseudonormalization). Elevated left atrial  pressure.  3. Right ventricular systolic function is severely reduced. The right  ventricular size is severely enlarged. There is mildly elevated pulmonary  artery systolic pressure. The estimated right ventricular systolic  pressure is 42.8 mmHg.  4. Left atrial size was moderately dilated.  5. Right atrial size was moderately dilated.  6. The mitral valve is normal in structure. Mild mitral valve  regurgitation. No evidence of mitral stenosis.  7. The aortic valve is normal in structure. Aortic valve regurgitation is  not visualized. No aortic stenosis is present.  8. The  inferior vena cava is normal in size with greater than 50%  respiratory variability, suggesting right atrial pressure of 3 mmHg.     Neuro/Psych Depression Schizophrenia    GI/Hepatic GERD  Medicated and Controlled,  Endo/Other  diabetes, Type 2  Renal/GU Dialysis and ESRFRenal disease     Musculoskeletal   Abdominal   Peds  Hematology   Anesthesia Other Findings   Reproductive/Obstetrics                          Anesthesia Physical Anesthesia Plan  ASA: III  Anesthesia Plan: MAC   Post-op Pain Management:    Induction: Intravenous  PONV Risk Score and Plan: Ondansetron, Dexamethasone and Treatment may vary due to age or medical condition  Airway Management Planned: Nasal Cannula  Additional Equipment:   Intra-op Plan:   Post-operative Plan:   Informed Consent: I have reviewed the patients History and Physical, chart, labs and discussed the procedure including the risks, benefits and alternatives for the proposed anesthesia with the patient or authorized representative who has indicated his/her understanding and acceptance.     Dental advisory given  Plan Discussed with: CRNA and Surgeon  Anesthesia Plan Comments: (See PAT note written 05/05/2020 by Myra Gianotti, PA-C. On hemodialysis. Has Medtronic ICD. )      Anesthesia Quick Evaluation

## 2020-05-05 NOTE — Progress Notes (Signed)
[facility] DEVICE PROGRAMMING  Patient Information: Name:  James Nicholson  DOB:  February 11, 1966  MRN:  [id number]   Planned Procedure: Plication of right arteriovenous fistula3  Surgeon: Dr Deitra Mayo  Date of Procedure: 05/08/20  Cautery will be used.  Position during surgery: Supine   Please send documentation back to:  [facility] (Fax # [phone])   Device Information:  Clinic EP Physician:  Cristopher Peru, MD  Device Type:  Defibrillator Manufacturer and Phone #:  Medtronic: [id number] Pacemaker Dependent?:  No. Date of Last Device Check:  04/13/2020 Normal Device Function?:  Yes.    Electrophysiologist's Recommendations:   Have magnet available.  Provide continuous ECG monitoring when magnet is used or reprogramming is to be performed.   Procedure may interfere with device function.  Magnet should be placed over device during procedure.  Per Device Clinic [address], [place], [place]  4:12 PM 05/05/2020

## 2020-05-05 NOTE — Progress Notes (Addendum)
Used [place] # [phone], Spoke with Gunnar Fusi [phone].  James Nicholson states patient does not have  shortness of breath, fever, cough or chest pain.  PCP - Dr Beverely Risen Cardiologist - Dr Crissie Sickles  Chest x-ray - 01/11/20 (2V0 EKG - 05/01/20 Stress Test - 01/10/20 ECHO - 01/09/20 Cardiac Cath - 2010  ICD Pacemaker- Medtronic Visia AF MRIT VR [id number].  Emailed [facility], Peri-op prescription was sent 05/05/20.  DM 2, diet controlled, no meds, does not check blood sugars  Anesthesia review: Yes  STOP now taking any Aspirin (unless otherwise instructed by your surgeon), Aleve, Naproxen, Ibuprofen, Motrin, Advil, Goody's, BC's, all herbal medications, fish oil, and all vitamins.   Coronavirus Screening Results pending on 05/05/20 Covid test. Do you have any of the following symptoms:  Cough yes/no: No Fever (>100.61F)  yes/no: No Runny nose yes/no: No Sore throat yes/no: No Difficulty breathing/shortness of breath  yes/no: No  Have you traveled in the last 14 days and where? yes/no: No  Brother James Nicholson verbalized understanding of instructions that were given via phone.

## 2020-05-05 NOTE — Progress Notes (Signed)
Anesthesia Chart Review: James Nicholson   Case: [id number] Date/Time: 05/08/20 9892   Procedure: PLICATION OF ARTERIOVENOUS  FISTULA (Right )   Anesthesia type: Monitor Anesthesia Care   Pre-op diagnosis: END STAGE RENAL DISEASE   Location: [place] OR ROOM 12 / [facility] OR   Surgeons: Angelia Mould, MD      DISCUSSION: Patient is a 54 year old [place] speaking male scheduled for the above procedure. He has an aneurysmal AVF with plan for staged plication of 2 large aneurysms.   History includes former smoker (quit 2010), HTN, dyslipidemia, schizophrenia, atrial fibrillation, chronic systolic CHF, cardiomyopathy, V. tach arrest 02/04/09 (s/p Medtronic ICD 01/2009; s/p extraction of previously implanted dual coil active-fixation defibrillation lead and insertion of a new single coil active-fixation defibrillation lead 01/10/20 due to undersensing/elevated pacing threshold likely due to scarring at that ventricular myocardial interface), ESRD (+p- ANCA necrotizing glomerulonephritis 2010 s/p IV Cytoxan and prednisone; hemodialysis since ~ 2014, TTS), anemia, Wegener's disease, IDDM. +PPD (9 months INH prescribed per Scharlene Gloss, MD 05/22/12).  - Admission 01/07/20-01/11/20 for chest pain. He was already scheduled for ICD lead extraction/replacement on 01/10/20. HS Troponin I [id number]. Cardiology was consulted. Dr. Caryl Comes felt symptoms atypical, "unlikely cardiac.  Troponins are unrevealing." However, resting Myoview and echocardiogram ordered. Echo showed LVEF 30-35% (down from 45-50% in 2015) and severely reduced RV systolic function, RSVP 11.9 mmHg. Stress test was read as high risk due to large fixed anterolateral wall defect concerning for prior infarct, EF 45%, but no reversible ischemia. He underwent ICD lead exchange as planned. His last cardiology visit was post-hospitalization for wound check on 01/14/20 with Barrington Ellison, PA-C, and he is scheduled for routine follow-up with Dr. Lovena Le on 06/02/20.    Last remote ICD interrogation 04/13/20. Preoperative ICD Rx from still pending for [facility] EP.   05/05/20 presurgical COVID-19 test in in process.  He has a same-day work-up so further evaluation on the day of surgery.   VS:   BP Readings from Last 3 Encounters:  05/01/20 (!) 149/91  03/17/20 (!) 143/85  03/15/20 (!) 157/85    PROVIDERS: [name], MD is PCP  Cristopher Peru, MD is EP cardiologist Nephrologist is with [facility] Brand Males, MD is pulmonologist - Per 05/01/20 ED visit, he is being referred to rheumatology for LLE purpuric rash felt likely a result of underlying Wegener's.   LABS: For ISTAT labs on the day of surgery. Currently most recent labs results include: Lab Results  Component Value Date   WBC 7.3 05/01/2020   HGB 9.2 (L) 05/01/2020   HCT 31.1 (L) 05/01/2020   PLT 194 05/01/2020   GLUCOSE 94 05/01/2020   CHOL 142 01/08/2020   TRIG 80 01/08/2020   HDL 25 (L) 01/08/2020   LDLCALC 101 (H) 01/08/2020   ALT 19 05/01/2020   AST 21 05/01/2020   NA 140 05/01/2020   K 6.1 (H) 05/01/2020   CL 100 05/01/2020   CREATININE 8.50 (H) 05/01/2020   BUN 52 (H) 05/01/2020   CO2 26 05/01/2020   TSH 1.912 01/08/2020   INR 1.2 05/01/2020   HGBA1C 4.5 (L) 01/08/2020     IMAGES: CXR 01/11/20: FINDINGS: Stable positioning of left chest wall ICD. Stable cardiomegaly. Mild linear bibasilar atelectasis. No new focal airspace consolidation, pleural effusion, or pneumothorax. Osseous structures appear intact. IMPRESSION: No active disease. Stable cardiomegaly and bibasilar atelectasis.    EKG: 05/01/20: Sinus rhythm RBBB and LAFB No significant change since last tracing Confirmed by Lacretia Leigh (  [id number]) on 05/01/2020 12:43:23 PM   CV: Nuclear stress tet 01/10/20: IMPRESSION: 1. Large fixed defect along the anterolateral wall the left ventricle, raising concern for prior infarction. No reversible ischemia. 2. Mild  global hypokinesis with associated left ventricular dilatation. 3. Left ventricular ejection fraction 45% 4. Non invasive risk stratification*: High *2012 Appropriate Use Criteria for Coronary Revascularization Focused Update: J Am Coll Cardiol. 5009;38(1):829-937. http://content.airportbarriers.com.aspx?articleid=1201161  Echo 01/09/20: IMPRESSIONS  1. Severe biventricular failure.  2. Left ventricular ejection fraction, by estimation, is 30 to 35%. The  left ventricle has moderately decreased function. The left ventricle has  no regional wall motion abnormalities. The left ventricular internal  cavity size was moderately dilated.  Left ventricular diastolic parameters are consistent with Grade II  diastolic dysfunction (pseudonormalization). Elevated left atrial  pressure.  3. Right ventricular systolic function is severely reduced. The right  ventricular size is severely enlarged. There is mildly elevated pulmonary  artery systolic pressure. The estimated right ventricular systolic  pressure is 16.9 mmHg.  4. Left atrial size was moderately dilated.  5. Right atrial size was moderately dilated.  6. The mitral valve is normal in structure. Mild mitral valve  regurgitation. No evidence of mitral stenosis.  7. The aortic valve is normal in structure. Aortic valve regurgitation is  not visualized. No aortic stenosis is present.  8. The inferior vena cava is normal in size with greater than 50%  respiratory variability, suggesting right atrial pressure of 3 mmHg.  (Comparison echo 12/23/13: LVEF 45-50%, diffuse hypokinesis, grade 1 DD, mild-moderate MR)    Past Medical History:  Diagnosis Date  . Anemia   . Atrial fibrillation   . Cardiomyopathy ([place]) 2010   Unclear Etiology: Last Echo 06/2009: EF 40-45%, severe Lateral & apical Hypokinesis (? CAD) ; Grade 2 DDysfxn. Mild conc LVH.   Marland Kitchen Cellulitis   . Chronic kidney disease (CKD), stage V ([place])    Dialysis Tue, Thurs,  Sat  . Diabetes mellitus without complication (H[facility])    type 2, diet controlled, no meds  . Dyslipidemia   . Enterobacter sepsis ([place])   . Hypertension   . Pneumonia   . S/P ICD (internal cardiac defibrillator) procedure 2010   VT Arrest (in [place])  . Schizophrenia ([place])   . 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 Wegener's granulomatosis ([place])     Past Surgical History:  Procedure Laterality Date  . A/V FISTULAGRAM Right 03/17/2020   Procedure: A/V FISTULAGRAM;  Surgeon: Angelia Mould, MD;  Location: [place] CV LAB;  Service: Cardiovascular;  Laterality: Right;  . AV FISTULA PLACEMENT Right 02/10/2013   Procedure: ARTERIOVENOUS (AV) FISTULA CREATION;  Surgeon: Rosetta Posner, MD;  Location: [facility] OR;  Service: Vascular;  Laterality: Right;  Right forearm radial/cephalic arterovenous fistula.   Marland Kitchen CARDIAC CATHETERIZATION  2010   [place]: In setting of VT arrest. Per brother's report, nonobstructive with no intervention  . CARDIAC DEFIBRILLATOR PLACEMENT  2010   [place]  . FISTULOGRAM Right 08/09/2013   Procedure: FISTULOGRAM;  Surgeon: Angelia Mould, MD;  Location: [facility] CATH LAB;  Service: Cardiovascular;  Laterality: Right;  . ICD GENERATOR CHANGEOUT N/A 12/05/2017   Procedure: ICD GENERATOR CHANGEOUT;  Surgeon: Evans Lance, MD;  Location: [place] CV LAB;  Service: Cardiovascular;  Laterality: N/A;  . ICD LEAD REMOVAL N/A 01/10/2020   Procedure: ICD LEAD REMOVAL AND IMPLANTATION OF NEW LEAD IN RIGHT VENTRICLE;  Surgeon: Evans Lance, MD;  Location: [place];  Service: Cardiovascular;  Laterality: N/A;  . LIGATION OF COMPETING BRANCHES OF ARTERIOVENOUS FISTULA Right 08/13/2013   Procedure: LIGATION OF COMPETING BRANCHES X5 OF ARTERIOVENOUS FISTULA- RIGHT ARM;  Surgeon: Serafina Mitchell, MD;  Location: [place];  Service: Vascular;  Laterality: Right;  . TEE WITHOUT CARDIOVERSION N/A 01/10/2020   Procedure: TRANSESOPHAGEAL ECHOCARDIOGRAM (TEE);  Surgeon: Evans Lance, MD;  Location: [facility] OR;  Service: Cardiovascular;  Laterality: N/A;    MEDICATIONS: No current facility-administered medications for this encounter.   Marland Kitchen atorvastatin (LIPITOR) 20 MG tablet  . AURYXIA 1 GM 210 MG(Fe) tablet  . metoprolol succinate (TOPROL-XL) 50 MG 24 hr tablet  . sodium zirconium cyclosilicate (LOKELMA) 5 g packet    Myra Gianotti, PA-C Surgical Short Stay/Anesthesiology [facility] Phone [phone] [facility] Phone [phone] 05/05/2020 1:35 PM

## 2020-05-06 DIAGNOSIS — E1122 Type 2 diabetes mellitus with diabetic chronic kidney disease: Secondary | ICD-10-CM | POA: Diagnosis not present

## 2020-05-06 DIAGNOSIS — Z992 Dependence on renal dialysis: Secondary | ICD-10-CM | POA: Diagnosis not present

## 2020-05-06 DIAGNOSIS — D509 Iron deficiency anemia, unspecified: Secondary | ICD-10-CM | POA: Diagnosis not present

## 2020-05-06 DIAGNOSIS — N2581 Secondary hyperparathyroidism of renal origin: Secondary | ICD-10-CM | POA: Diagnosis not present

## 2020-05-06 DIAGNOSIS — D631 Anemia in chronic kidney disease: Secondary | ICD-10-CM | POA: Diagnosis not present

## 2020-05-06 DIAGNOSIS — N186 End stage renal disease: Secondary | ICD-10-CM | POA: Diagnosis not present

## 2020-05-08 ENCOUNTER — Encounter (HOSPITAL_COMMUNITY)
Admission: RE | Disposition: A | Discharge status: Home / Self Care | Payer: Self-pay | Source: Home / Self Care | Attending: Vascular Surgery

## 2020-05-08 ENCOUNTER — Other Ambulatory Visit: Payer: Self-pay

## 2020-05-08 ENCOUNTER — Ambulatory Visit (HOSPITAL_COMMUNITY): Payer: Medicare Other | Admitting: Certified Registered Nurse Anesthetist

## 2020-05-08 ENCOUNTER — Ambulatory Visit (HOSPITAL_COMMUNITY)
Admission: RE | Admit: 2020-05-08 | Discharge: 2020-05-08 | Disposition: A | Discharge status: Home / Self Care | Payer: Medicare Other | Attending: Vascular Surgery | Admitting: Vascular Surgery

## 2020-05-08 ENCOUNTER — Encounter (HOSPITAL_COMMUNITY): Payer: Self-pay | Admitting: Vascular Surgery

## 2020-05-08 DIAGNOSIS — I5082 Biventricular heart failure: Secondary | ICD-10-CM | POA: Diagnosis not present

## 2020-05-08 DIAGNOSIS — Z8249 Family history of ischemic heart disease and other diseases of the circulatory system: Secondary | ICD-10-CM | POA: Insufficient documentation

## 2020-05-08 DIAGNOSIS — E1122 Type 2 diabetes mellitus with diabetic chronic kidney disease: Secondary | ICD-10-CM | POA: Insufficient documentation

## 2020-05-08 DIAGNOSIS — X58XXXA Exposure to other specified factors, initial encounter: Secondary | ICD-10-CM | POA: Diagnosis not present

## 2020-05-08 DIAGNOSIS — Z95810 Presence of automatic (implantable) cardiac defibrillator: Secondary | ICD-10-CM | POA: Insufficient documentation

## 2020-05-08 DIAGNOSIS — F209 Schizophrenia, unspecified: Secondary | ICD-10-CM | POA: Diagnosis not present

## 2020-05-08 DIAGNOSIS — Z87891 Personal history of nicotine dependence: Secondary | ICD-10-CM | POA: Diagnosis not present

## 2020-05-08 DIAGNOSIS — I728 Aneurysm of other specified arteries: Secondary | ICD-10-CM | POA: Diagnosis not present

## 2020-05-08 DIAGNOSIS — I4891 Unspecified atrial fibrillation: Secondary | ICD-10-CM | POA: Insufficient documentation

## 2020-05-08 DIAGNOSIS — K219 Gastro-esophageal reflux disease without esophagitis: Secondary | ICD-10-CM | POA: Diagnosis not present

## 2020-05-08 DIAGNOSIS — E785 Hyperlipidemia, unspecified: Secondary | ICD-10-CM | POA: Diagnosis not present

## 2020-05-08 DIAGNOSIS — I340 Nonrheumatic mitral (valve) insufficiency: Secondary | ICD-10-CM | POA: Diagnosis not present

## 2020-05-08 DIAGNOSIS — T82898A Other specified complication of vascular prosthetic devices, implants and grafts, initial encounter: Secondary | ICD-10-CM | POA: Insufficient documentation

## 2020-05-08 DIAGNOSIS — Z79899 Other long term (current) drug therapy: Secondary | ICD-10-CM | POA: Diagnosis not present

## 2020-05-08 DIAGNOSIS — F329 Major depressive disorder, single episode, unspecified: Secondary | ICD-10-CM | POA: Diagnosis not present

## 2020-05-08 DIAGNOSIS — N185 Chronic kidney disease, stage 5: Secondary | ICD-10-CM | POA: Insufficient documentation

## 2020-05-08 DIAGNOSIS — I429 Cardiomyopathy, unspecified: Secondary | ICD-10-CM | POA: Diagnosis not present

## 2020-05-08 DIAGNOSIS — I132 Hypertensive heart and chronic kidney disease with heart failure and with stage 5 chronic kidney disease, or end stage renal disease: Secondary | ICD-10-CM | POA: Insufficient documentation

## 2020-05-08 DIAGNOSIS — I509 Heart failure, unspecified: Secondary | ICD-10-CM | POA: Insufficient documentation

## 2020-05-08 DIAGNOSIS — I5022 Chronic systolic (congestive) heart failure: Secondary | ICD-10-CM | POA: Diagnosis not present

## 2020-05-08 DIAGNOSIS — Z992 Dependence on renal dialysis: Secondary | ICD-10-CM | POA: Diagnosis not present

## 2020-05-08 DIAGNOSIS — N186 End stage renal disease: Secondary | ICD-10-CM | POA: Diagnosis not present

## 2020-05-08 HISTORY — DX: Depression, unspecified: F32.A

## 2020-05-08 HISTORY — DX: Hyperlipidemia, unspecified: E78.5

## 2020-05-08 HISTORY — DX: Type 2 diabetes mellitus without complications: E11.9

## 2020-05-08 HISTORY — PX: LIGATION OF ARTERIOVENOUS  FISTULA: SHX5948

## 2020-05-08 HISTORY — DX: Presence of automatic (implantable) cardiac defibrillator: Z95.810

## 2020-05-08 LAB — POCT I-STAT, CHEM 8
BUN: 35 mg/dL — ABNORMAL HIGH (ref 6–20)
Calcium, Ion: 1.25 mmol/L (ref 1.15–1.40)
Chloride: 97 mmol/L — ABNORMAL LOW (ref 98–111)
Creatinine, Ser: 8.6 mg/dL — ABNORMAL HIGH (ref 0.61–1.24)
Glucose, Bld: 86 mg/dL (ref 70–99)
HCT: 29 % — ABNORMAL LOW (ref 39.0–52.0)
Hemoglobin: 9.9 g/dL — ABNORMAL LOW (ref 13.0–17.0)
Potassium: 4.5 mmol/L (ref 3.5–5.1)
Sodium: 137 mmol/L (ref 135–145)
TCO2: 30 mmol/L (ref 22–32)

## 2020-05-08 LAB — GLUCOSE, CAPILLARY
Glucose-Capillary: 85 mg/dL (ref 70–99)
Glucose-Capillary: 96 mg/dL (ref 70–99)

## 2020-05-08 SURGERY — LIGATION OF ARTERIOVENOUS  FISTULA
Anesthesia: Monitor Anesthesia Care | Laterality: Right

## 2020-05-08 MED ORDER — PROPOFOL 500 MG/50ML IV EMUL
INTRAVENOUS | Status: DC | PRN
  Administered 2020-05-08: 75 ug/kg/min via INTRAVENOUS

## 2020-05-08 MED ORDER — SODIUM CHLORIDE 0.9 % IV SOLN
INTRAVENOUS | Status: AC
  Filled 2020-05-08: qty 1.2

## 2020-05-08 MED ORDER — CHLORHEXIDINE GLUCONATE 0.12 % MT SOLN
OROMUCOSAL | Status: AC
  Filled 2020-05-08: qty 15

## 2020-05-08 MED ORDER — CEFAZOLIN SODIUM-DEXTROSE 2-4 GM/100ML-% IV SOLN
2.0000 g | INTRAVENOUS | Status: AC
  Administered 2020-05-08: 2 g via INTRAVENOUS
  Filled 2020-05-08: qty 100

## 2020-05-08 MED ORDER — LACTATED RINGERS IV SOLN: INTRAVENOUS | Status: DC

## 2020-05-08 MED ORDER — LIDOCAINE 2% (20 MG/ML) 5 ML SYRINGE
INTRAMUSCULAR | Status: DC | PRN
  Administered 2020-05-08: 40 mg via INTRAVENOUS

## 2020-05-08 MED ORDER — HEPARIN SODIUM (PORCINE) 1000 UNIT/ML IJ SOLN
INTRAMUSCULAR | Status: AC
  Filled 2020-05-08: qty 1

## 2020-05-08 MED ORDER — CHLORHEXIDINE GLUCONATE 4 % EX LIQD: 60.0000 mL | Freq: Once | CUTANEOUS | Status: DC

## 2020-05-08 MED ORDER — OXYCODONE HCL 5 MG PO TABS: 5.0000 mg | ORAL_TABLET | ORAL | 0 refills | Status: DC | PRN

## 2020-05-08 MED ORDER — MIDAZOLAM HCL 2 MG/2ML IJ SOLN
INTRAMUSCULAR | Status: AC
  Filled 2020-05-08: qty 2

## 2020-05-08 MED ORDER — CHLORHEXIDINE GLUCONATE 0.12 % MT SOLN
15.0000 mL | Freq: Once | OROMUCOSAL | Status: AC
  Administered 2020-05-08: 15 mL via OROMUCOSAL

## 2020-05-08 MED ORDER — LIDOCAINE-EPINEPHRINE (PF) 1 %-1:200000 IJ SOLN
INTRAMUSCULAR | Status: AC
  Filled 2020-05-08: qty 30

## 2020-05-08 MED ORDER — FENTANYL CITRATE (PF) 250 MCG/5ML IJ SOLN
INTRAMUSCULAR | Status: AC
  Filled 2020-05-08: qty 5

## 2020-05-08 MED ORDER — PROPOFOL 10 MG/ML IV BOLUS
INTRAVENOUS | Status: AC
  Filled 2020-05-08: qty 20

## 2020-05-08 MED ORDER — SODIUM CHLORIDE 0.9 % IV SOLN: INTRAVENOUS | Status: DC

## 2020-05-08 MED ORDER — MIDAZOLAM HCL 5 MG/5ML IJ SOLN
INTRAMUSCULAR | Status: DC | PRN
  Administered 2020-05-08: 2 mg via INTRAVENOUS

## 2020-05-08 MED ORDER — LIDOCAINE HCL (PF) 1 % IJ SOLN
INTRAMUSCULAR | Status: AC
  Filled 2020-05-08: qty 30

## 2020-05-08 MED ORDER — LIDOCAINE-EPINEPHRINE (PF) 1 %-1:200000 IJ SOLN
INTRAMUSCULAR | Status: DC | PRN
  Administered 2020-05-08: 19 mL

## 2020-05-08 MED ORDER — HEPARIN SODIUM (PORCINE) 1000 UNIT/ML IJ SOLN
INTRAMUSCULAR | Status: DC | PRN
  Administered 2020-05-08: 7000 [IU] via INTRAVENOUS

## 2020-05-08 MED ORDER — PROTAMINE SULFATE 10 MG/ML IV SOLN
INTRAVENOUS | Status: DC | PRN
  Administered 2020-05-08: 40 mg via INTRAVENOUS

## 2020-05-08 MED ORDER — LIDOCAINE 2% (20 MG/ML) 5 ML SYRINGE
INTRAMUSCULAR | Status: AC
  Filled 2020-05-08: qty 5

## 2020-05-08 MED ORDER — PROTAMINE SULFATE 10 MG/ML IV SOLN
INTRAVENOUS | Status: AC
  Filled 2020-05-08: qty 5

## 2020-05-08 MED ORDER — SODIUM CHLORIDE 0.9 % IV SOLN: INTRAVENOUS | Status: DC | PRN

## 2020-05-08 MED ORDER — 0.9 % SODIUM CHLORIDE (POUR BTL) OPTIME
TOPICAL | Status: DC | PRN
  Administered 2020-05-08: 1000 mL

## 2020-05-08 MED ORDER — FENTANYL CITRATE (PF) 250 MCG/5ML IJ SOLN
INTRAMUSCULAR | Status: DC | PRN
  Administered 2020-05-08: 50 ug via INTRAVENOUS

## 2020-05-08 MED ORDER — ORAL CARE MOUTH RINSE: 15.0000 mL | Freq: Once | OROMUCOSAL | Status: AC

## 2020-05-08 SURGICAL SUPPLY — 37 items
CANISTER SUCT 3000ML PPV (MISCELLANEOUS) ×2 IMPLANT
CANNULA VESSEL 3MM 2 BLNT TIP (CANNULA) ×2 IMPLANT
CLIP VESOCCLUDE MED 6/CT (CLIP) ×2 IMPLANT
CLIP VESOCCLUDE SM WIDE 6/CT (CLIP) ×2 IMPLANT
COVER WAND RF STERILE (DRAPES) IMPLANT
DERMABOND ADVANCED (GAUZE/BANDAGES/DRESSINGS) ×1
DERMABOND ADVANCED .7 DNX12 (GAUZE/BANDAGES/DRESSINGS) ×1 IMPLANT
ELECT REM PT RETURN 9FT ADLT (ELECTROSURGICAL) ×2
ELECTRODE REM PT RTRN 9FT ADLT (ELECTROSURGICAL) ×1 IMPLANT
GLOVE BIO SURGEON STRL SZ7.5 (GLOVE) ×2 IMPLANT
GLOVE BIOGEL PI IND STRL 6.5 (GLOVE) ×3 IMPLANT
GLOVE BIOGEL PI IND STRL 7.0 (GLOVE) ×2 IMPLANT
GLOVE BIOGEL PI IND STRL 8 (GLOVE) ×1 IMPLANT
GLOVE BIOGEL PI INDICATOR 6.5 (GLOVE) ×3
GLOVE BIOGEL PI INDICATOR 7.0 (GLOVE) ×2
GLOVE BIOGEL PI INDICATOR 8 (GLOVE) ×1
GLOVE SURG SS PI 6.5 STRL IVOR (GLOVE) ×6 IMPLANT
GOWN STRL NON-REIN LRG LVL3 (GOWN DISPOSABLE) ×2 IMPLANT
GOWN STRL REUS W/ TWL LRG LVL3 (GOWN DISPOSABLE) ×3 IMPLANT
GOWN STRL REUS W/TWL LRG LVL3 (GOWN DISPOSABLE) ×3
KIT BASIN OR (CUSTOM PROCEDURE TRAY) ×2 IMPLANT
KIT TURNOVER KIT B (KITS) ×2 IMPLANT
NEEDLE HYPO 25GX1X1/2 BEV (NEEDLE) IMPLANT
NS IRRIG 1000ML POUR BTL (IV SOLUTION) ×2 IMPLANT
PACK CV ACCESS (CUSTOM PROCEDURE TRAY) ×2 IMPLANT
PAD ARMBOARD 7.5X6 YLW CONV (MISCELLANEOUS) ×4 IMPLANT
SPONGE SURGIFOAM ABS GEL 100 (HEMOSTASIS) IMPLANT
SUT ETHILON 3 0 PS 1 (SUTURE) IMPLANT
SUT PROLENE 5 0 C 1 24 (SUTURE) ×4 IMPLANT
SUT PROLENE 6 0 BV (SUTURE) ×2 IMPLANT
SUT SILK 0 TIES 10X30 (SUTURE) IMPLANT
SUT VIC AB 3-0 SH 27 (SUTURE) ×1
SUT VIC AB 3-0 SH 27X BRD (SUTURE) ×1 IMPLANT
SUT VICRYL 4-0 PS2 18IN ABS (SUTURE) ×2 IMPLANT
TOWEL GREEN STERILE (TOWEL DISPOSABLE) ×2 IMPLANT
UNDERPAD 30X36 HEAVY ABSORB (UNDERPADS AND DIAPERS) ×2 IMPLANT
WATER STERILE IRR 1000ML POUR (IV SOLUTION) ×2 IMPLANT

## 2020-05-08 NOTE — Anesthesia Postprocedure Evaluation (Signed)
Anesthesia Post Note  Patient: James Nicholson  Procedure(s) Performed: PLICATION OF ARTERIOVENOUS  FISTULA RIGHT ARM (Right )     Patient location during evaluation: PACU Anesthesia Type: MAC Level of consciousness: awake and alert Pain management: pain level controlled Vital Signs Assessment: post-procedure vital signs reviewed and stable Respiratory status: spontaneous breathing, nonlabored ventilation, respiratory function stable and patient connected to nasal cannula oxygen Cardiovascular status: stable and blood pressure returned to baseline Postop Assessment: no apparent nausea or vomiting Anesthetic complications: no   No complications documented.  Last Vitals:  Vitals:   05/08/20 0920 05/08/20 0934  BP: 122/85 122/78  Pulse: 77 75  Resp: (!) 21 18  Temp:  36.5 C  SpO2: 99% 95%    Last Pain:  Vitals:   05/08/20 0934  PainSc: 0-No pain                 Donicia Druck DAVID

## 2020-05-08 NOTE — Anesthesia Procedure Notes (Signed)
Procedure Name: MAC Date/Time: 05/08/2020 7:34 AM Performed by: Colin Benton, CRNA Pre-anesthesia Checklist: Patient identified, Emergency Drugs available, Suction available and Patient being monitored Patient Re-evaluated:Patient Re-evaluated prior to induction Oxygen Delivery Method: Simple face mask Induction Type: IV induction Placement Confirmation: positive ETCO2 Dental Injury: Teeth and Oropharynx as per pre-operative assessment

## 2020-05-08 NOTE — Progress Notes (Addendum)
Pt. Has multiple ares of scabbed sores on right and left lower legs.  Pt. Didn't take beta blocker. Notified Dr. Conrad Osage. Stated not to give it.

## 2020-05-08 NOTE — Op Note (Signed)
    NAME: James Nicholson    MRN: [id number] DOB: Sep 14, 1966    DATE OF OPERATION: 05/08/2020  PREOP DIAGNOSIS:    Aneurysmal right radiocephalic AV fistula  POSTOP DIAGNOSIS:    Same  PROCEDURE:    Plication of aneurysm of right radiocephalic AV fistula  SURGEON: Judeth Cornfield. Scot Dock, MD  ASSIST: Izetta Dakin, RNFA  ANESTHESIA: Local with sedation  EBL: Minimal  INDICATIONS:    [name] is a 54 y.o. male who presents for plication of one of his aneurysms.  He has 2 large aneurysms and we elected to address the more central aneurysm 1st so that they could still access the fistula without having to place a catheter.  Once this had healed he would come back for staged plication of the 2nd aneurysm.  FINDINGS:   Good thrill at the completion of the procedure.  TECHNIQUE:   The patient was taken to the operating room and sedated by anesthesia.  The right arm was prepped and draped in usual sterile fashion.  Elliptical incision was made encompassing the more central aneurysm after the skin was anesthetized.  This was oriented in a transverse fashion.  The aneurysm was dissected free circumferentially and the patient was heparinized.  The vein was clamped proximally and distally and I excised a large ellipse of the aneurysmal segment.  This was then sewn back with running 5-0 Prolene suture.  I then placed 2 tacking sutures to slightly rotate the fistula to protect the suture line so that this could be cannulated anteriorly.  Hemostasis was obtained in the wound.  The wound was then closed with interrupted 3-0 Vicryl and the skin closed with 4-0 Vicryl's.  Dermabond was applied.  The patient tolerated the procedure well was transferred to the recovery room in stable condition.  All needle and sponge counts were correct.  Deitra Mayo, MD, FACS [facility]  DATE OF DICTATION:   05/08/2020

## 2020-05-08 NOTE — Transfer of Care (Signed)
Immediate Anesthesia Transfer of Care Note  Patient: James Nicholson  Procedure(s) Performed: PLICATION OF ARTERIOVENOUS  FISTULA RIGHT ARM (Right )  Patient Location: PACU  Anesthesia Type:MAC  Level of Consciousness: drowsy  Airway & Oxygen Therapy: Patient Spontanous Breathing  Post-op Assessment: Report given to RN and Post -op Vital signs reviewed and stable  Post vital signs: Reviewed and stable  Last Vitals:  Vitals Value Taken Time  BP    Temp    Pulse    Resp    SpO2      Last Pain:  Vitals:   05/08/20 0622  PainSc: 0-No pain      Patients Stated Pain Goal: 4 (46/27/03 5009)  Complications: No complications documented.

## 2020-05-08 NOTE — H&P (Signed)
REASON FOR VISIT:   Aneurysmal right AV fistula.  The consult is requested by Dr. Royce Macadamia.  MEDICAL ISSUES:   ANEURYSMAL RIGHT AV FISTULA: I think the patient is a candidate for stage plication of the aneurysms in his right radiocephalic AV fistula.  However the fistula is somewhat pulsatile suggesting an outflow obstruction and for this reason I think this should be addressed first.  Therefore I have scheduled him for a fistulogram on 03/17/2020 and possible venoplasty.  Once his this is complete we will schedule him for plication of the more central aneurysm first and then subsequently plication of the more distal aneurysm.  He dialyzes on Tuesdays Thursdays and Saturdays and we will work around his dialysis schedule.  We have discussed the indications of the procedure and the potential complications and he is agreeable to proceed.  HPI:   James Nicholson is a pleasant 54 y.o. male who has had a right forearm AV fistula since 2014.  This has been working well however he has developed 2 large aneurysms over the fistula presents for evaluation.  The history is obtained through his brother who translates for him.  The fistula has been reportedly working well.  He has no pain or paresthesias in his right arm.      Past Medical History:  Diagnosis Date  . Anemia   . Atrial fibrillation   . Cardiomyopathy ([place]) 2010   Unclear Etiology: Last Echo 06/2009: EF 40-45%, severe Lateral & apical Hypokinesis (? CAD) ; Grade 2 DDysfxn. Mild conc LVH.   Marland Kitchen Cellulitis   . Chronic kidney disease (CKD), stage V ([place])    Dialysis Tue, Thurs, Sat  . Dyslipidemia   . Enterobacter sepsis ([place])   . Hypertension   . IDDM (insulin dependent diabetes mellitus) 11/03/2014  . Pneumonia   . S/P ICD (internal cardiac defibrillator) procedure 2010   VT Arrest (in [place])  . Schizophrenia ([place])   . 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 Wegener's granulomatosis ([place])            Family History  Problem Relation Age of Onset  . CAD Mother     SOCIAL HISTORY: Social History        Tobacco Use  . Smoking status: Former Smoker    Quit date: 10/28/2008    Years since quitting: 11.3  . Smokeless tobacco: Never Used  Substance Use Topics  . Alcohol use: No    No Known Allergies        Current Outpatient Medications  Medication Sig Dispense Refill  . atorvastatin (LIPITOR) 20 MG tablet Take 1 tablet (20 mg total) by mouth daily. 90 tablet 3  . AURYXIA 1 GM 210 MG(Fe) tablet Take 420 mg by mouth 3 (three) times daily.    . B Complex-C-Folic Acid (DIALYVITE TABLET) TABS Take by mouth.    . calcitRIOL (ROCALTROL) 0.5 MCG capsule     . calcium acetate (PHOSLO) 667 MG capsule Take 1,334 mg by mouth 3 (three) times daily.    Marland Kitchen gabapentin (NEURONTIN) 300 MG capsule     . metoprolol succinate (TOPROL-XL) 50 MG 24 hr tablet Take 1 tablet (50 mg total) by mouth daily. 90 tablet 3  . risperiDONE (RISPERDAL) 0.25 MG tablet Take 0.25 mg by mouth at bedtime.    . sulfamethoxazole-trimethoprim (BACTRIM DS) 800-160 MG tablet Take 1 tablet by mouth 3 (three) times a week.     No current facility-administered medications for this visit.    REVIEW OF SYSTEMS:  [  X] denotes positive finding, [ ]  denotes negative finding Cardiac  Comments:  Chest pain or chest pressure:    Shortness of breath upon exertion:    Short of breath when lying flat:    Irregular heart rhythm:        Vascular    Pain in calf, thigh, or hip brought on by ambulation:    Pain in feet at night that wakes you up from your sleep:     Blood clot in your veins:    Leg swelling:         Pulmonary    Oxygen at home:    Productive cough:     Wheezing:         Neurologic    Sudden weakness in arms or legs:     Sudden numbness in arms or legs:     Sudden onset of difficulty speaking or slurred speech:    Temporary loss of  vision in one eye:     Problems with dizziness:         Gastrointestinal    Blood in stool:     Vomited blood:         Genitourinary    Burning when urinating:     Blood in urine:        Psychiatric    Major depression:         Hematologic    Bleeding problems:    Problems with blood clotting too easily:        Skin    Rashes or ulcers:        Constitutional    Fever or chills:     PHYSICAL EXAM:   There were no vitals filed for this visit.  GENERAL: The patient is a well-nourished male, in no acute distress. The vital signs are documented above. CARDIAC: There is a regular rate and rhythm.  VASCULAR: His fistula is somewhat pulsatile.  He has 2 large aneurysms as shown below.    REASON FOR VISIT:   Aneurysmal right AV fistula.  The consult is requested by Dr. Royce Macadamia.  MEDICAL ISSUES:   ANEURYSMAL RIGHT AV FISTULA: I think the patient is a candidate for stage plication of the aneurysms in his right radiocephalic AV fistula.  However the fistula is somewhat pulsatile suggesting an outflow obstruction and for this reason I think this should be addressed first.  Therefore I have scheduled him for a fistulogram on 03/17/2020 and possible venoplasty.  Once his this is complete we will schedule him for plication of the more central aneurysm first and then subsequently plication of the more distal aneurysm.  He dialyzes on Tuesdays Thursdays and Saturdays and we will work around his dialysis schedule.  We have discussed the indications of the procedure and the potential complications and he is agreeable to proceed.  HPI:   James Nicholson is a pleasant 54 y.o. male who has had a right forearm AV fistula since 2014.  This has been working well however he has developed 2 large aneurysms over the fistula presents for evaluation.  The history is obtained through his brother who translates for him.  The fistula has been  reportedly working well.  He has no pain or paresthesias in his right arm.      Past Medical History:  Diagnosis Date  . Anemia   . Atrial fibrillation   . Cardiomyopathy ([place]) 2010   Unclear Etiology: Last Echo 06/2009: EF 40-45%, severe Lateral & apical Hypokinesis (? CAD) ;  Grade 2 DDysfxn. Mild conc LVH.   Marland Kitchen Cellulitis   . Chronic kidney disease (CKD), stage V ([place])    Dialysis Tue, Thurs, Sat  . Dyslipidemia   . Enterobacter sepsis ([place])   . Hypertension   . IDDM (insulin dependent diabetes mellitus) 11/03/2014  . Pneumonia   . S/P ICD (internal cardiac defibrillator) procedure 2010   VT Arrest (in [place])  . Schizophrenia ([place])   . 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 Wegener's granulomatosis ([place])          Family History  Problem Relation Age of Onset  . CAD Mother     SOCIAL HISTORY: Social History        Tobacco Use  . Smoking status: Former Smoker    Quit date: 10/28/2008    Years since quitting: 11.3  . Smokeless tobacco: Never Used  Substance Use Topics  . Alcohol use: No    No Known Allergies        Current Outpatient Medications  Medication Sig Dispense Refill  . atorvastatin (LIPITOR) 20 MG tablet Take 1 tablet (20 mg total) by mouth daily. 90 tablet 3  . AURYXIA 1 GM 210 MG(Fe) tablet Take 420 mg by mouth 3 (three) times daily.    . B Complex-C-Folic Acid (DIALYVITE TABLET) TABS Take by mouth.    . calcitRIOL (ROCALTROL) 0.5 MCG capsule     . calcium acetate (PHOSLO) 667 MG capsule Take 1,334 mg by mouth 3 (three) times daily.    Marland Kitchen gabapentin (NEURONTIN) 300 MG capsule     . metoprolol succinate (TOPROL-XL) 50 MG 24 hr tablet Take 1 tablet (50 mg total) by mouth daily. 90 tablet 3  . risperiDONE (RISPERDAL) 0.25 MG tablet Take 0.25 mg by mouth at bedtime.    . sulfamethoxazole-trimethoprim (BACTRIM DS) 800-160 MG tablet Take 1 tablet by mouth 3 (three) times a week.     No current  facility-administered medications for this visit.    REVIEW OF SYSTEMS:  [X]  denotes positive finding, [ ]  denotes negative finding Cardiac  Comments:  Chest pain or chest pressure:    Shortness of breath upon exertion:    Short of breath when lying flat:    Irregular heart rhythm:        Vascular    Pain in calf, thigh, or hip brought on by ambulation:    Pain in feet at night that wakes you up from your sleep:     Blood clot in your veins:    Leg swelling:         Pulmonary    Oxygen at home:    Productive cough:     Wheezing:         Neurologic    Sudden weakness in arms or legs:     Sudden numbness in arms or legs:     Sudden onset of difficulty speaking or slurred speech:    Temporary loss of vision in one eye:     Problems with dizziness:         Gastrointestinal    Blood in stool:     Vomited blood:         Genitourinary    Burning when urinating:     Blood in urine:        Psychiatric    Major depression:         Hematologic    Bleeding problems:    Problems with blood clotting too easily:  Skin    Rashes or ulcers:        Constitutional    Fever or chills:     PHYSICAL EXAM:   There were no vitals filed for this visit.  GENERAL: The patient is a well-nourished male, in no acute distress. The vital signs are documented above. CARDIAC: There is a regular rate and rhythm.  VASCULAR: His fistula is somewhat pulsatile.  He has 2 large aneurysms as shown below.    PULMONARY: There is good air exchange bilaterally without wheezing or rales. ABDOMEN: Soft and non-tender with normal pitched bowel sounds.  MUSCULOSKELETAL: There are no major deformities or cyanosis. NEUROLOGIC: No focal weakness or paresthesias are detected. SKIN: There are no ulcers or rashes noted. PSYCHIATRIC: The patient has a normal affect.  DATA:    No new  data  Deitra Mayo [facility] [phone]    Communications  PULMONARY: There is good air exchange bilaterally without wheezing or rales. ABDOMEN: Soft and non-tender with normal pitched bowel sounds.  MUSCULOSKELETAL: There are no major deformities or cyanosis. NEUROLOGIC: No focal weakness or paresthesias are detected. SKIN: There are no ulcers or rashes noted. PSYCHIATRIC: The patient has a normal affect.  DATA:    No new data  Deitra Mayo [facility] [employer] [phone]    Communications

## 2020-05-09 ENCOUNTER — Encounter (HOSPITAL_COMMUNITY): Payer: Self-pay | Admitting: Vascular Surgery

## 2020-05-09 DIAGNOSIS — Z992 Dependence on renal dialysis: Secondary | ICD-10-CM | POA: Diagnosis not present

## 2020-05-09 DIAGNOSIS — N186 End stage renal disease: Secondary | ICD-10-CM | POA: Diagnosis not present

## 2020-05-09 DIAGNOSIS — E1122 Type 2 diabetes mellitus with diabetic chronic kidney disease: Secondary | ICD-10-CM | POA: Diagnosis not present

## 2020-05-09 DIAGNOSIS — D509 Iron deficiency anemia, unspecified: Secondary | ICD-10-CM | POA: Diagnosis not present

## 2020-05-09 DIAGNOSIS — N2581 Secondary hyperparathyroidism of renal origin: Secondary | ICD-10-CM | POA: Diagnosis not present

## 2020-05-09 DIAGNOSIS — D631 Anemia in chronic kidney disease: Secondary | ICD-10-CM | POA: Diagnosis not present

## 2020-05-11 DIAGNOSIS — Z992 Dependence on renal dialysis: Secondary | ICD-10-CM | POA: Diagnosis not present

## 2020-05-11 DIAGNOSIS — D631 Anemia in chronic kidney disease: Secondary | ICD-10-CM | POA: Diagnosis not present

## 2020-05-11 DIAGNOSIS — D509 Iron deficiency anemia, unspecified: Secondary | ICD-10-CM | POA: Diagnosis not present

## 2020-05-11 DIAGNOSIS — N186 End stage renal disease: Secondary | ICD-10-CM | POA: Diagnosis not present

## 2020-05-11 DIAGNOSIS — N2581 Secondary hyperparathyroidism of renal origin: Secondary | ICD-10-CM | POA: Diagnosis not present

## 2020-05-11 DIAGNOSIS — E1122 Type 2 diabetes mellitus with diabetic chronic kidney disease: Secondary | ICD-10-CM | POA: Diagnosis not present

## 2020-05-13 DIAGNOSIS — I10 Essential (primary) hypertension: Secondary | ICD-10-CM | POA: Diagnosis not present

## 2020-05-13 DIAGNOSIS — R778 Other specified abnormalities of plasma proteins: Secondary | ICD-10-CM | POA: Diagnosis not present

## 2020-05-13 DIAGNOSIS — D509 Iron deficiency anemia, unspecified: Secondary | ICD-10-CM | POA: Diagnosis not present

## 2020-05-13 DIAGNOSIS — H5711 Ocular pain, right eye: Secondary | ICD-10-CM | POA: Diagnosis not present

## 2020-05-13 DIAGNOSIS — I120 Hypertensive chronic kidney disease with stage 5 chronic kidney disease or end stage renal disease: Secondary | ICD-10-CM | POA: Diagnosis not present

## 2020-05-13 DIAGNOSIS — E162 Hypoglycemia, unspecified: Secondary | ICD-10-CM | POA: Diagnosis not present

## 2020-05-13 DIAGNOSIS — I452 Bifascicular block: Secondary | ICD-10-CM | POA: Diagnosis not present

## 2020-05-13 DIAGNOSIS — Z20822 Contact with and (suspected) exposure to covid-19: Secondary | ICD-10-CM | POA: Diagnosis not present

## 2020-05-13 DIAGNOSIS — L738 Other specified follicular disorders: Secondary | ICD-10-CM | POA: Diagnosis not present

## 2020-05-13 DIAGNOSIS — M3130 Wegener's granulomatosis without renal involvement: Secondary | ICD-10-CM | POA: Diagnosis not present

## 2020-05-13 DIAGNOSIS — E1122 Type 2 diabetes mellitus with diabetic chronic kidney disease: Secondary | ICD-10-CM | POA: Diagnosis not present

## 2020-05-13 DIAGNOSIS — R279 Unspecified lack of coordination: Secondary | ICD-10-CM | POA: Diagnosis not present

## 2020-05-13 DIAGNOSIS — E161 Other hypoglycemia: Secondary | ICD-10-CM | POA: Diagnosis not present

## 2020-05-13 DIAGNOSIS — R609 Edema, unspecified: Secondary | ICD-10-CM | POA: Diagnosis not present

## 2020-05-13 DIAGNOSIS — N186 End stage renal disease: Secondary | ICD-10-CM | POA: Diagnosis not present

## 2020-05-13 DIAGNOSIS — Z992 Dependence on renal dialysis: Secondary | ICD-10-CM | POA: Diagnosis not present

## 2020-05-13 DIAGNOSIS — I429 Cardiomyopathy, unspecified: Secondary | ICD-10-CM | POA: Diagnosis not present

## 2020-05-13 DIAGNOSIS — H02849 Edema of unspecified eye, unspecified eyelid: Secondary | ICD-10-CM | POA: Diagnosis not present

## 2020-05-13 DIAGNOSIS — N2581 Secondary hyperparathyroidism of renal origin: Secondary | ICD-10-CM | POA: Diagnosis not present

## 2020-05-13 DIAGNOSIS — D631 Anemia in chronic kidney disease: Secondary | ICD-10-CM | POA: Diagnosis not present

## 2020-05-13 DIAGNOSIS — R404 Transient alteration of awareness: Secondary | ICD-10-CM | POA: Diagnosis not present

## 2020-05-13 DIAGNOSIS — Z743 Need for continuous supervision: Secondary | ICD-10-CM | POA: Diagnosis not present

## 2020-05-14 DIAGNOSIS — I120 Hypertensive chronic kidney disease with stage 5 chronic kidney disease or end stage renal disease: Secondary | ICD-10-CM | POA: Diagnosis not present

## 2020-05-14 DIAGNOSIS — E785 Hyperlipidemia, unspecified: Secondary | ICD-10-CM | POA: Diagnosis not present

## 2020-05-14 DIAGNOSIS — H0520 Unspecified exophthalmos: Secondary | ICD-10-CM | POA: Diagnosis not present

## 2020-05-14 DIAGNOSIS — I7789 Other specified disorders of arteries and arterioles: Secondary | ICD-10-CM | POA: Diagnosis not present

## 2020-05-14 DIAGNOSIS — N186 End stage renal disease: Secondary | ICD-10-CM | POA: Diagnosis not present

## 2020-05-14 DIAGNOSIS — Z8679 Personal history of other diseases of the circulatory system: Secondary | ICD-10-CM | POA: Diagnosis not present

## 2020-05-14 DIAGNOSIS — N058 Unspecified nephritic syndrome with other morphologic changes: Secondary | ICD-10-CM | POA: Diagnosis not present

## 2020-05-14 DIAGNOSIS — I452 Bifascicular block: Secondary | ICD-10-CM | POA: Diagnosis not present

## 2020-05-14 DIAGNOSIS — Z992 Dependence on renal dialysis: Secondary | ICD-10-CM | POA: Diagnosis not present

## 2020-05-14 DIAGNOSIS — H0589 Other disorders of orbit: Secondary | ICD-10-CM | POA: Diagnosis not present

## 2020-05-14 DIAGNOSIS — M3130 Wegener's granulomatosis without renal involvement: Secondary | ICD-10-CM | POA: Diagnosis not present

## 2020-05-15 DIAGNOSIS — M3130 Wegener's granulomatosis without renal involvement: Secondary | ICD-10-CM | POA: Diagnosis present

## 2020-05-15 DIAGNOSIS — H0589 Other disorders of orbit: Secondary | ICD-10-CM | POA: Diagnosis not present

## 2020-05-15 DIAGNOSIS — J9811 Atelectasis: Secondary | ICD-10-CM | POA: Diagnosis not present

## 2020-05-15 DIAGNOSIS — N390 Urinary tract infection, site not specified: Secondary | ICD-10-CM | POA: Diagnosis not present

## 2020-05-15 DIAGNOSIS — Z8615 Personal history of latent tuberculosis infection: Secondary | ICD-10-CM | POA: Diagnosis not present

## 2020-05-15 DIAGNOSIS — H0520 Unspecified exophthalmos: Secondary | ICD-10-CM | POA: Diagnosis not present

## 2020-05-15 DIAGNOSIS — Z95810 Presence of automatic (implantable) cardiac defibrillator: Secondary | ICD-10-CM | POA: Diagnosis not present

## 2020-05-15 DIAGNOSIS — R0609 Other forms of dyspnea: Secondary | ICD-10-CM | POA: Diagnosis not present

## 2020-05-15 DIAGNOSIS — E1122 Type 2 diabetes mellitus with diabetic chronic kidney disease: Secondary | ICD-10-CM | POA: Diagnosis present

## 2020-05-15 DIAGNOSIS — I452 Bifascicular block: Secondary | ICD-10-CM | POA: Diagnosis not present

## 2020-05-15 DIAGNOSIS — Z992 Dependence on renal dialysis: Secondary | ICD-10-CM | POA: Diagnosis not present

## 2020-05-15 DIAGNOSIS — E875 Hyperkalemia: Secondary | ICD-10-CM | POA: Diagnosis present

## 2020-05-15 DIAGNOSIS — Z959 Presence of cardiac and vascular implant and graft, unspecified: Secondary | ICD-10-CM | POA: Diagnosis not present

## 2020-05-15 DIAGNOSIS — Z8611 Personal history of tuberculosis: Secondary | ICD-10-CM | POA: Diagnosis not present

## 2020-05-15 DIAGNOSIS — L988 Other specified disorders of the skin and subcutaneous tissue: Secondary | ICD-10-CM | POA: Diagnosis not present

## 2020-05-15 DIAGNOSIS — Z20822 Contact with and (suspected) exposure to covid-19: Secondary | ICD-10-CM | POA: Diagnosis present

## 2020-05-15 DIAGNOSIS — I120 Hypertensive chronic kidney disease with stage 5 chronic kidney disease or end stage renal disease: Secondary | ICD-10-CM | POA: Diagnosis present

## 2020-05-15 DIAGNOSIS — Z872 Personal history of diseases of the skin and subcutaneous tissue: Secondary | ICD-10-CM | POA: Diagnosis not present

## 2020-05-15 DIAGNOSIS — I429 Cardiomyopathy, unspecified: Secondary | ICD-10-CM | POA: Diagnosis present

## 2020-05-15 DIAGNOSIS — I7789 Other specified disorders of arteries and arterioles: Secondary | ICD-10-CM | POA: Diagnosis present

## 2020-05-15 DIAGNOSIS — M3131 Wegener's granulomatosis with renal involvement: Secondary | ICD-10-CM | POA: Diagnosis not present

## 2020-05-15 DIAGNOSIS — Z227 Latent tuberculosis: Secondary | ICD-10-CM | POA: Diagnosis not present

## 2020-05-15 DIAGNOSIS — M317 Microscopic polyangiitis: Secondary | ICD-10-CM | POA: Diagnosis not present

## 2020-05-15 DIAGNOSIS — H5711 Ocular pain, right eye: Secondary | ICD-10-CM | POA: Diagnosis not present

## 2020-05-15 DIAGNOSIS — F209 Schizophrenia, unspecified: Secondary | ICD-10-CM | POA: Diagnosis not present

## 2020-05-15 DIAGNOSIS — L738 Other specified follicular disorders: Secondary | ICD-10-CM | POA: Diagnosis present

## 2020-05-15 DIAGNOSIS — E785 Hyperlipidemia, unspecified: Secondary | ICD-10-CM | POA: Diagnosis present

## 2020-05-15 DIAGNOSIS — G9009 Other idiopathic peripheral autonomic neuropathy: Secondary | ICD-10-CM | POA: Diagnosis not present

## 2020-05-15 DIAGNOSIS — I4891 Unspecified atrial fibrillation: Secondary | ICD-10-CM | POA: Diagnosis present

## 2020-05-15 DIAGNOSIS — N186 End stage renal disease: Secondary | ICD-10-CM | POA: Diagnosis present

## 2020-05-15 DIAGNOSIS — E119 Type 2 diabetes mellitus without complications: Secondary | ICD-10-CM | POA: Diagnosis not present

## 2020-05-15 DIAGNOSIS — Z8674 Personal history of sudden cardiac arrest: Secondary | ICD-10-CM | POA: Diagnosis not present

## 2020-05-20 DIAGNOSIS — Z992 Dependence on renal dialysis: Secondary | ICD-10-CM | POA: Diagnosis not present

## 2020-05-20 DIAGNOSIS — E1122 Type 2 diabetes mellitus with diabetic chronic kidney disease: Secondary | ICD-10-CM | POA: Diagnosis not present

## 2020-05-20 DIAGNOSIS — D509 Iron deficiency anemia, unspecified: Secondary | ICD-10-CM | POA: Diagnosis not present

## 2020-05-20 DIAGNOSIS — N2581 Secondary hyperparathyroidism of renal origin: Secondary | ICD-10-CM | POA: Diagnosis not present

## 2020-05-20 DIAGNOSIS — D631 Anemia in chronic kidney disease: Secondary | ICD-10-CM | POA: Diagnosis not present

## 2020-05-20 DIAGNOSIS — N186 End stage renal disease: Secondary | ICD-10-CM | POA: Diagnosis not present

## 2020-05-23 DIAGNOSIS — N2581 Secondary hyperparathyroidism of renal origin: Secondary | ICD-10-CM | POA: Diagnosis not present

## 2020-05-23 DIAGNOSIS — D509 Iron deficiency anemia, unspecified: Secondary | ICD-10-CM | POA: Diagnosis not present

## 2020-05-23 DIAGNOSIS — Z992 Dependence on renal dialysis: Secondary | ICD-10-CM | POA: Diagnosis not present

## 2020-05-23 DIAGNOSIS — N186 End stage renal disease: Secondary | ICD-10-CM | POA: Diagnosis not present

## 2020-05-23 DIAGNOSIS — D631 Anemia in chronic kidney disease: Secondary | ICD-10-CM | POA: Diagnosis not present

## 2020-05-23 DIAGNOSIS — E1122 Type 2 diabetes mellitus with diabetic chronic kidney disease: Secondary | ICD-10-CM | POA: Diagnosis not present

## 2020-05-25 DIAGNOSIS — E1122 Type 2 diabetes mellitus with diabetic chronic kidney disease: Secondary | ICD-10-CM | POA: Diagnosis not present

## 2020-05-25 DIAGNOSIS — N186 End stage renal disease: Secondary | ICD-10-CM | POA: Diagnosis not present

## 2020-05-25 DIAGNOSIS — Z992 Dependence on renal dialysis: Secondary | ICD-10-CM | POA: Diagnosis not present

## 2020-05-25 DIAGNOSIS — D631 Anemia in chronic kidney disease: Secondary | ICD-10-CM | POA: Diagnosis not present

## 2020-05-25 DIAGNOSIS — N2581 Secondary hyperparathyroidism of renal origin: Secondary | ICD-10-CM | POA: Diagnosis not present

## 2020-05-25 DIAGNOSIS — D509 Iron deficiency anemia, unspecified: Secondary | ICD-10-CM | POA: Diagnosis not present

## 2020-05-26 DIAGNOSIS — M3131 Wegener's granulomatosis with renal involvement: Secondary | ICD-10-CM | POA: Diagnosis not present

## 2020-05-26 DIAGNOSIS — I776 Arteritis, unspecified: Secondary | ICD-10-CM | POA: Diagnosis not present

## 2020-05-26 DIAGNOSIS — Z79899 Other long term (current) drug therapy: Secondary | ICD-10-CM | POA: Diagnosis not present

## 2020-05-27 DIAGNOSIS — N2581 Secondary hyperparathyroidism of renal origin: Secondary | ICD-10-CM | POA: Diagnosis not present

## 2020-05-27 DIAGNOSIS — E1122 Type 2 diabetes mellitus with diabetic chronic kidney disease: Secondary | ICD-10-CM | POA: Diagnosis not present

## 2020-05-27 DIAGNOSIS — D631 Anemia in chronic kidney disease: Secondary | ICD-10-CM | POA: Diagnosis not present

## 2020-05-27 DIAGNOSIS — D509 Iron deficiency anemia, unspecified: Secondary | ICD-10-CM | POA: Diagnosis not present

## 2020-05-27 DIAGNOSIS — Z992 Dependence on renal dialysis: Secondary | ICD-10-CM | POA: Diagnosis not present

## 2020-05-27 DIAGNOSIS — N186 End stage renal disease: Secondary | ICD-10-CM | POA: Diagnosis not present

## 2020-05-28 DIAGNOSIS — Z992 Dependence on renal dialysis: Secondary | ICD-10-CM | POA: Diagnosis not present

## 2020-05-28 DIAGNOSIS — N186 End stage renal disease: Secondary | ICD-10-CM | POA: Diagnosis not present

## 2020-05-28 DIAGNOSIS — N032 Chronic nephritic syndrome with diffuse membranous glomerulonephritis: Secondary | ICD-10-CM | POA: Diagnosis not present

## 2020-05-29 DIAGNOSIS — T82590A Other mechanical complication of surgically created arteriovenous fistula, initial encounter: Secondary | ICD-10-CM | POA: Insufficient documentation

## 2020-05-29 DIAGNOSIS — N2581 Secondary hyperparathyroidism of renal origin: Secondary | ICD-10-CM | POA: Diagnosis not present

## 2020-05-29 DIAGNOSIS — E1122 Type 2 diabetes mellitus with diabetic chronic kidney disease: Secondary | ICD-10-CM | POA: Diagnosis not present

## 2020-05-29 DIAGNOSIS — N186 End stage renal disease: Secondary | ICD-10-CM | POA: Diagnosis not present

## 2020-05-29 DIAGNOSIS — D509 Iron deficiency anemia, unspecified: Secondary | ICD-10-CM | POA: Diagnosis not present

## 2020-05-29 DIAGNOSIS — D631 Anemia in chronic kidney disease: Secondary | ICD-10-CM | POA: Diagnosis not present

## 2020-05-29 DIAGNOSIS — Z992 Dependence on renal dialysis: Secondary | ICD-10-CM | POA: Diagnosis not present

## 2020-05-30 DIAGNOSIS — D509 Iron deficiency anemia, unspecified: Secondary | ICD-10-CM | POA: Diagnosis not present

## 2020-05-30 DIAGNOSIS — N186 End stage renal disease: Secondary | ICD-10-CM | POA: Diagnosis not present

## 2020-05-30 DIAGNOSIS — D631 Anemia in chronic kidney disease: Secondary | ICD-10-CM | POA: Diagnosis not present

## 2020-05-30 DIAGNOSIS — N2581 Secondary hyperparathyroidism of renal origin: Secondary | ICD-10-CM | POA: Diagnosis not present

## 2020-05-30 DIAGNOSIS — E1122 Type 2 diabetes mellitus with diabetic chronic kidney disease: Secondary | ICD-10-CM | POA: Diagnosis not present

## 2020-05-30 DIAGNOSIS — Z992 Dependence on renal dialysis: Secondary | ICD-10-CM | POA: Diagnosis not present

## 2020-05-31 DIAGNOSIS — H05111 Granuloma of right orbit: Secondary | ICD-10-CM | POA: Diagnosis not present

## 2020-05-31 DIAGNOSIS — H0520 Unspecified exophthalmos: Secondary | ICD-10-CM | POA: Diagnosis not present

## 2020-06-01 DIAGNOSIS — Z992 Dependence on renal dialysis: Secondary | ICD-10-CM | POA: Diagnosis not present

## 2020-06-01 DIAGNOSIS — D509 Iron deficiency anemia, unspecified: Secondary | ICD-10-CM | POA: Diagnosis not present

## 2020-06-01 DIAGNOSIS — E1122 Type 2 diabetes mellitus with diabetic chronic kidney disease: Secondary | ICD-10-CM | POA: Diagnosis not present

## 2020-06-01 DIAGNOSIS — N186 End stage renal disease: Secondary | ICD-10-CM | POA: Diagnosis not present

## 2020-06-01 DIAGNOSIS — N2581 Secondary hyperparathyroidism of renal origin: Secondary | ICD-10-CM | POA: Diagnosis not present

## 2020-06-01 DIAGNOSIS — D631 Anemia in chronic kidney disease: Secondary | ICD-10-CM | POA: Diagnosis not present

## 2020-06-02 ENCOUNTER — Other Ambulatory Visit: Payer: Self-pay

## 2020-06-02 ENCOUNTER — Encounter: Payer: Self-pay | Admitting: Internal Medicine

## 2020-06-02 ENCOUNTER — Ambulatory Visit (INDEPENDENT_AMBULATORY_CARE_PROVIDER_SITE_OTHER): Payer: Medicare Other | Admitting: Internal Medicine

## 2020-06-02 VITALS — BP 138/78 | HR 70 | Ht 67.0 in | Wt 160.0 lb

## 2020-06-02 DIAGNOSIS — I255 Ischemic cardiomyopathy: Secondary | ICD-10-CM

## 2020-06-02 DIAGNOSIS — I10 Essential (primary) hypertension: Secondary | ICD-10-CM | POA: Diagnosis not present

## 2020-06-02 DIAGNOSIS — Z95810 Presence of automatic (implantable) cardiac defibrillator: Secondary | ICD-10-CM | POA: Diagnosis not present

## 2020-06-02 DIAGNOSIS — I5022 Chronic systolic (congestive) heart failure: Secondary | ICD-10-CM

## 2020-06-02 DIAGNOSIS — R7401 Elevation of levels of liver transaminase levels: Secondary | ICD-10-CM | POA: Diagnosis not present

## 2020-06-02 DIAGNOSIS — I4901 Ventricular fibrillation: Secondary | ICD-10-CM

## 2020-06-02 NOTE — Patient Instructions (Signed)
Medication Instructions:  Your physician recommends that you continue on your current medications as directed. Please refer to the Current Medication list given to you today.  Labwork: None ordered.  Testing/Procedures: None ordered.  Follow-Up: Your physician wants you to follow-up in: one year with Dr. Lovena Le.   You will receive a reminder letter in the mail two months in advance. If you don't receive a letter, please call our office to schedule the follow-up appointment.  Remote monitoring is used to monitor your ICD from home. This monitoring reduces the number of office visits required to check your device to one time per year. It allows [place] to keep an eye on the functioning of your device to ensure it is working properly. You are scheduled for a device check from home on 07/13/2020. You may send your transmission at any time that day. If you have a wireless device, the transmission will be sent automatically. After your physician reviews your transmission, you will receive a postcard with your next transmission date.  Any Other Special Instructions Will Be Listed Below (If Applicable).  If you need a refill on your cardiac medications before your next appointment, please call your pharmacy.

## 2020-06-02 NOTE — Progress Notes (Signed)
HPI [name] returns today for followup. He is a pleasant 54 yo man with a h/o VF arrest, s/p ICD insertion, who has ESRD and is on HD. He has Wegeners. He has been treated with steroids. He has been stable since his ICD lead revision. No trouble healing.  No Known Allergies   Current Outpatient Medications  Medication Sig Dispense Refill  . atorvastatin (LIPITOR) 20 MG tablet Take 1 tablet (20 mg total) by mouth daily. 90 tablet 3  . AURYXIA 1 GM 210 MG(Fe) tablet Take 210 mg by mouth 3 (three) times daily.     . metoprolol succinate (TOPROL-XL) 50 MG 24 hr tablet Take 1 tablet (50 mg total) by mouth daily. 90 tablet 3  . oxyCODONE (ROXICODONE) 5 MG immediate release tablet Take 1 tablet (5 mg total) by mouth every 4 (four) hours as needed. 15 tablet 0  . predniSONE (DELTASONE) 20 MG tablet Take 30 mg by mouth daily. For 5 days    . sodium zirconium cyclosilicate (LOKELMA) 5 g packet Take 10 g by mouth daily. 1 packet 0   No current facility-administered medications for this visit.     Past Medical History:  Diagnosis Date  . AICD (automatic cardioverter/defibrillator) present    Medtronic Visia AF MRIT VR [id number]  . Anemia   . Atrial fibrillation   . Cardiomyopathy ([place]) 2010   Unclear Etiology: Last Echo 06/2009: EF 40-45%, severe Lateral & apical Hypokinesis (? CAD) ; Grade 2 DDysfxn. Mild conc LVH.   Marland Kitchen Cellulitis   . Chronic kidney disease (CKD), stage V ([place])    Dialysis Tue, Thurs, Sat  . Depression    no meds  . Diabetes mellitus without complication ([place])    type 2, diet controlled, no meds, pt. denies  . Dyslipidemia   . Enterobacter sepsis ([place])   . HLD (hyperlipidemia)   . Hypertension   . Pneumonia   . S/P ICD (internal cardiac defibrillator) procedure 2010   VT Arrest (in [place])  . Schizophrenia ([place])    no meds  . 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 Wegener's granulomatosis ([place])     ROS:   All systems reviewed and negative except as  noted in the HPI.   Past Surgical History:  Procedure Laterality Date  . A/V FISTULAGRAM Right 03/17/2020   Procedure: A/V FISTULAGRAM;  Surgeon: Angelia Mould, MD;  Location: [facility];  Service: Cardiovascular;  Laterality: Right;  . AV FISTULA PLACEMENT Right 02/10/2013   Procedure: ARTERIOVENOUS (AV) FISTULA CREATION;  Surgeon: Rosetta Posner, MD;  [facility];  Service: Vascular;  Laterality: Right;  Right forearm radial/cephalic arterovenous fistula.   Marland Kitchen CARDIAC CATHETERIZATION  2010   [place]: In setting of VT arrest. Per brother's report, nonobstructive with no intervention  . CARDIAC DEFIBRILLATOR PLACEMENT  2010   [place]  . FISTULOGRAM Right 08/09/2013   Procedure: FISTULOGRAM;  Surgeon: Angelia Mould, MD;  Location: [facility] CATH LAB;  Service: Cardiovascular;  Laterality: Right;  . ICD GENERATOR CHANGEOUT N/A 12/05/2017   Procedure: ICD GENERATOR CHANGEOUT;  Surgeon: Evans Lance, MD;  Location: [facility];  Service: Cardiovascular;  Laterality: N/A;  . ICD LEAD REMOVAL N/A 01/10/2020   Procedure: ICD LEAD REMOVAL AND IMPLANTATION OF NEW LEAD IN RIGHT VENTRICLE;  Surgeon: Evans Lance, MD;  Location: [facility] OR;  Service: Cardiovascular;  Laterality: N/A;  . LIGATION OF ARTERIOVENOUS  FISTULA Right 6/76/7209   Procedure: PLICATION  OF ARTERIOVENOUS  FISTULA RIGHT ARM;  Surgeon: Angelia Mould, MD;  Location: [facility];  Service: Vascular;  Laterality: Right;  . LIGATION OF COMPETING BRANCHES OF ARTERIOVENOUS FISTULA Right 08/13/2013   Procedure: LIGATION OF COMPETING BRANCHES X5 OF ARTERIOVENOUS FISTULA- RIGHT ARM;  Surgeon: Serafina Mitchell, MD;  Location: [facility];  Service: Vascular;  Laterality: Right;  . TEE WITHOUT CARDIOVERSION N/A 01/10/2020   Procedure: TRANSESOPHAGEAL ECHOCARDIOGRAM (TEE);  Surgeon: Evans Lance, MD;  Location: [facility] OR;  Service: Cardiovascular;  Laterality: N/A;     Family History  Problem Relation Age of Onset  . CAD  Mother      Social History   Socioeconomic History  . Marital status: Single    Spouse name: Not on file  . Number of children: Not on file  . Years of education: Not on file  . Highest education level: Not on file  Occupational History  . Not on file  Tobacco Use  . Smoking status: Former Smoker    Types: Cigarettes    Quit date: 10/28/2008    Years since quitting: 11.6  . Smokeless tobacco: Never Used  Vaping Use  . Vaping Use: Never used  Substance and Sexual Activity  . Alcohol use: No  . Drug use: No  . Sexual activity: Not on file  Other Topics Concern  . Not on file  Social History Narrative  . Not on file   Social Determinants of Health   Financial Resource Strain:   . Difficulty of Paying Living Expenses:   Food Insecurity:   . Worried About Charity fundraiser in the Last Year:   . Arboriculturist in the Last Year:   Transportation Needs:   . Film/video editor (Medical):   Marland Kitchen Lack of Transportation (Non-Medical):   Physical Activity:   . Days of Exercise per Week:   . Minutes of Exercise per Session:   Stress:   . Feeling of Stress :   Social Connections:   . Frequency of Communication with Friends and Family:   . Frequency of Social Gatherings with Friends and Family:   . Attends Religious Services:   . Active Member of Clubs or Organizations:   . Attends Archivist Meetings:   Marland Kitchen Marital Status:   Intimate Partner Violence:   . Fear of Current or Ex-Partner:   . Emotionally Abused:   Marland Kitchen Physically Abused:   . Sexually Abused:      BP 138/78   Pulse 70   Ht 5\' 7"  (1.702 m)   Wt 160 lb (72.6 kg)   SpO2 97%   BMI 25.06 kg/m   Physical Exam:  Chronically ill appearing appearing NAD HEENT: Unremarkable Neck:  No JVD, no thyromegally Lymphatics:  No adenopathy Back:  No CVA tenderness Lungs:  Clear with no wheezes HEART:  Regular rate rhythm, no murmurs, no rubs, no clicks Abd:  soft, positive bowel sounds, no  organomegally, no rebound, no guarding Ext:  2 plus pulses, no edema, no cyanosis, no clubbing Skin:  No rashes no nodules Neuro:  CN II through XII intact, motor grossly intact  EKG- NSR with RBBB  DEVICE  Normal device function.  See PaceArt for details.   Assess/Plan: 1. VF - he has had no additional episodes 2. ICD - he is s/p ICD revision and his device is working normally. 3. HTN - he has had some trouble with HD. His bp has  Been dropping. He may  require a reduction in his beta blocker.  Mikle Bosworth.D.

## 2020-06-03 DIAGNOSIS — D509 Iron deficiency anemia, unspecified: Secondary | ICD-10-CM | POA: Diagnosis not present

## 2020-06-03 DIAGNOSIS — N186 End stage renal disease: Secondary | ICD-10-CM | POA: Diagnosis not present

## 2020-06-03 DIAGNOSIS — N2581 Secondary hyperparathyroidism of renal origin: Secondary | ICD-10-CM | POA: Diagnosis not present

## 2020-06-03 DIAGNOSIS — E1122 Type 2 diabetes mellitus with diabetic chronic kidney disease: Secondary | ICD-10-CM | POA: Diagnosis not present

## 2020-06-03 DIAGNOSIS — Z992 Dependence on renal dialysis: Secondary | ICD-10-CM | POA: Diagnosis not present

## 2020-06-03 DIAGNOSIS — D631 Anemia in chronic kidney disease: Secondary | ICD-10-CM | POA: Diagnosis not present

## 2020-06-06 DIAGNOSIS — E1122 Type 2 diabetes mellitus with diabetic chronic kidney disease: Secondary | ICD-10-CM | POA: Diagnosis not present

## 2020-06-06 DIAGNOSIS — N2581 Secondary hyperparathyroidism of renal origin: Secondary | ICD-10-CM | POA: Diagnosis not present

## 2020-06-06 DIAGNOSIS — Z992 Dependence on renal dialysis: Secondary | ICD-10-CM | POA: Diagnosis not present

## 2020-06-06 DIAGNOSIS — D631 Anemia in chronic kidney disease: Secondary | ICD-10-CM | POA: Diagnosis not present

## 2020-06-06 DIAGNOSIS — D509 Iron deficiency anemia, unspecified: Secondary | ICD-10-CM | POA: Diagnosis not present

## 2020-06-06 DIAGNOSIS — N186 End stage renal disease: Secondary | ICD-10-CM | POA: Diagnosis not present

## 2020-06-08 DIAGNOSIS — D631 Anemia in chronic kidney disease: Secondary | ICD-10-CM | POA: Diagnosis not present

## 2020-06-08 DIAGNOSIS — N186 End stage renal disease: Secondary | ICD-10-CM | POA: Diagnosis not present

## 2020-06-08 DIAGNOSIS — Z992 Dependence on renal dialysis: Secondary | ICD-10-CM | POA: Diagnosis not present

## 2020-06-08 DIAGNOSIS — E1122 Type 2 diabetes mellitus with diabetic chronic kidney disease: Secondary | ICD-10-CM | POA: Diagnosis not present

## 2020-06-08 DIAGNOSIS — N2581 Secondary hyperparathyroidism of renal origin: Secondary | ICD-10-CM | POA: Diagnosis not present

## 2020-06-08 DIAGNOSIS — D509 Iron deficiency anemia, unspecified: Secondary | ICD-10-CM | POA: Diagnosis not present

## 2020-06-09 DIAGNOSIS — M3131 Wegener's granulomatosis with renal involvement: Secondary | ICD-10-CM | POA: Diagnosis not present

## 2020-06-09 DIAGNOSIS — I776 Arteritis, unspecified: Secondary | ICD-10-CM | POA: Diagnosis not present

## 2020-06-09 DIAGNOSIS — Z79899 Other long term (current) drug therapy: Secondary | ICD-10-CM | POA: Diagnosis not present

## 2020-06-10 DIAGNOSIS — E1122 Type 2 diabetes mellitus with diabetic chronic kidney disease: Secondary | ICD-10-CM | POA: Diagnosis not present

## 2020-06-10 DIAGNOSIS — N186 End stage renal disease: Secondary | ICD-10-CM | POA: Diagnosis not present

## 2020-06-10 DIAGNOSIS — D509 Iron deficiency anemia, unspecified: Secondary | ICD-10-CM | POA: Diagnosis not present

## 2020-06-10 DIAGNOSIS — Z992 Dependence on renal dialysis: Secondary | ICD-10-CM | POA: Diagnosis not present

## 2020-06-10 DIAGNOSIS — N2581 Secondary hyperparathyroidism of renal origin: Secondary | ICD-10-CM | POA: Diagnosis not present

## 2020-06-10 DIAGNOSIS — D631 Anemia in chronic kidney disease: Secondary | ICD-10-CM | POA: Diagnosis not present

## 2020-06-13 DIAGNOSIS — D631 Anemia in chronic kidney disease: Secondary | ICD-10-CM | POA: Diagnosis not present

## 2020-06-13 DIAGNOSIS — N2581 Secondary hyperparathyroidism of renal origin: Secondary | ICD-10-CM | POA: Diagnosis not present

## 2020-06-13 DIAGNOSIS — D509 Iron deficiency anemia, unspecified: Secondary | ICD-10-CM | POA: Diagnosis not present

## 2020-06-13 DIAGNOSIS — Z992 Dependence on renal dialysis: Secondary | ICD-10-CM | POA: Diagnosis not present

## 2020-06-13 DIAGNOSIS — E1122 Type 2 diabetes mellitus with diabetic chronic kidney disease: Secondary | ICD-10-CM | POA: Diagnosis not present

## 2020-06-13 DIAGNOSIS — N186 End stage renal disease: Secondary | ICD-10-CM | POA: Diagnosis not present

## 2020-06-14 ENCOUNTER — Encounter: Payer: Self-pay | Admitting: Vascular Surgery

## 2020-06-14 ENCOUNTER — Other Ambulatory Visit: Payer: Self-pay | Admitting: *Deleted

## 2020-06-14 ENCOUNTER — Encounter: Payer: Self-pay | Admitting: *Deleted

## 2020-06-14 ENCOUNTER — Ambulatory Visit (INDEPENDENT_AMBULATORY_CARE_PROVIDER_SITE_OTHER): Payer: Medicare Other | Admitting: Vascular Surgery

## 2020-06-14 ENCOUNTER — Other Ambulatory Visit: Payer: Self-pay

## 2020-06-14 VITALS — BP 166/81 | HR 70 | Temp 98.4°F | Resp 20 | Ht 67.0 in | Wt 160.0 lb

## 2020-06-14 DIAGNOSIS — N186 End stage renal disease: Secondary | ICD-10-CM

## 2020-06-14 DIAGNOSIS — Z992 Dependence on renal dialysis: Secondary | ICD-10-CM

## 2020-06-14 NOTE — Progress Notes (Signed)
Patient name: James Nicholson MRN: [id number] DOB: 1966-05-21 Sex: male  REASON FOR VISIT:   Follow-up of AV fistula.  HPI:   James Nicholson is a pleasant 54 y.o. male who I saw on 05/08/2020 with aneurysms of his right radiocephalic AV fistula.  The patient had undergone a fistulogram on 03/17/2020 which showed a widely patent right radiocephalic fistula with no central venous stenosis.  The upper arm cephalic vein was occluded and the forearm fistula emptied into the brachial system.  There was no outflow stenosis identified.  The plan was for staged plication of his 2 large aneurysms of his right forearm fistula.  On 05/08/2020 the patient underwent plication of one of his 2 large aneurysms on his AV fistula.  I address the more central aneurysm first.  I felt that once this was healed we could consider plication of the more peripheral aneurysm.  Comes in today to discuss the next stage.  They have been using his fistula where I recently revised that without difficulty.  However further centrally the vein is somewhat tortuous and then had some problems cannulating it there and reportedly.  Therefore they have been sticking the larger aneurysm which we plan on repairing.  He dialyzes on Tuesdays Thursdays and Saturdays.  Of note the history was obtained through the translation device in the office.  Current Outpatient Medications  Medication Sig Dispense Refill  . atorvastatin (LIPITOR) 20 MG tablet Take 1 tablet (20 mg total) by mouth daily. 90 tablet 3  . AURYXIA 1 GM 210 MG(Fe) tablet Take 210 mg by mouth 3 (three) times daily.     . calcium acetate (PHOSLO) 667 MG capsule Take 1,334 mg by mouth 3 (three) times daily.    Marland Kitchen gabapentin (NEURONTIN) 300 MG capsule Take by mouth.    . metoprolol succinate (TOPROL-XL) 50 MG 24 hr tablet Take 1 tablet (50 mg total) by mouth daily. 90 tablet 3  . oxyCODONE (ROXICODONE) 5 MG immediate release tablet Take 1 tablet (5 mg total) by mouth every 4  (four) hours as needed. 15 tablet 0  . predniSONE (DELTASONE) 20 MG tablet Take 30 mg by mouth daily. For 5 days    . risperiDONE (RISPERDAL) 0.25 MG tablet Take 0.25 mg by mouth at bedtime.    . sodium zirconium cyclosilicate (LOKELMA) 5 g packet Take 10 g by mouth daily. 1 packet 0  . traZODone (DESYREL) 100 MG tablet Take 100 mg by mouth at bedtime.     No current facility-administered medications for this visit.    REVIEW OF SYSTEMS:  [X]  denotes positive finding, [ ]  denotes negative finding Vascular    Leg swelling    Cardiac    Chest pain or chest pressure:    Shortness of breath upon exertion:    Short of breath when lying flat:    Irregular heart rhythm:    Constitutional    Fever or chills:     PHYSICAL EXAM:   Vitals:   06/14/20 1442  BP: (!) 166/81  Pulse: 70  Resp: 20  Temp: 98.4 F (36.9 C)  SpO2: 98%  Weight: 72.6 kg  Height: 5\' 7"  (1.702 m)    GENERAL: The patient is a well-nourished male, in no acute distress. The vital signs are documented above. CARDIOVASCULAR: There is a regular rate and rhythm. PULMONARY: There is good air exchange bilaterally without wheezing or rales. The area where he had the aneurysm plicated has healed nicely.  The more peripheral aneurysm is  unchanged.  It is fairly large.  DATA:   No new data.  MEDICAL ISSUES:   END-STAGE RENAL DISEASE: I think at this point it safe to proceed with plication of the second aneurysm in his right AV fistula.  However given the problems they are having cannulating the fistula further centrally because of the tortuosity I have recommended that we place a tunneled dialysis catheter at the same time.  I think they will be able to use the fistula approximately 4 to 6 weeks postoperatively and then the catheter can be removed.  He has an AICD on the left so we can only place this on the right side.  I explained that if we had problems getting in and on the right to be a small chance we would have to  place the catheter in the groin.  His surgery has been scheduled for 06/19/2020.  I have reviewed the indications of the procedure and the potential complications and he is agreeable to proceed.  Deitra Mayo [facility] [phone]

## 2020-06-14 NOTE — Progress Notes (Signed)
Chart opened in error

## 2020-06-15 DIAGNOSIS — N2581 Secondary hyperparathyroidism of renal origin: Secondary | ICD-10-CM | POA: Diagnosis not present

## 2020-06-15 DIAGNOSIS — D509 Iron deficiency anemia, unspecified: Secondary | ICD-10-CM | POA: Diagnosis not present

## 2020-06-15 DIAGNOSIS — Z992 Dependence on renal dialysis: Secondary | ICD-10-CM | POA: Diagnosis not present

## 2020-06-15 DIAGNOSIS — N186 End stage renal disease: Secondary | ICD-10-CM | POA: Diagnosis not present

## 2020-06-15 DIAGNOSIS — D631 Anemia in chronic kidney disease: Secondary | ICD-10-CM | POA: Diagnosis not present

## 2020-06-15 DIAGNOSIS — E1122 Type 2 diabetes mellitus with diabetic chronic kidney disease: Secondary | ICD-10-CM | POA: Diagnosis not present

## 2020-06-16 ENCOUNTER — Encounter (HOSPITAL_COMMUNITY): Payer: Self-pay | Admitting: Vascular Surgery

## 2020-06-16 ENCOUNTER — Encounter: Payer: Self-pay | Admitting: Internal Medicine

## 2020-06-16 ENCOUNTER — Other Ambulatory Visit (HOSPITAL_COMMUNITY)
Admission: RE | Admit: 2020-06-16 | Discharge: 2020-06-16 | Disposition: A | Discharge status: Home / Self Care | Payer: Medicare Other | Source: Ambulatory Visit | Attending: Vascular Surgery | Admitting: Vascular Surgery

## 2020-06-16 DIAGNOSIS — Z20822 Contact with and (suspected) exposure to covid-19: Secondary | ICD-10-CM | POA: Insufficient documentation

## 2020-06-16 DIAGNOSIS — Z01812 Encounter for preprocedural laboratory examination: Secondary | ICD-10-CM | POA: Insufficient documentation

## 2020-06-16 LAB — SARS CORONAVIRUS 2 (TAT 6-24 HRS): SARS Coronavirus 2: NEGATIVE

## 2020-06-16 NOTE — Anesthesia Preprocedure Evaluation (Deleted)
Anesthesia Evaluation    Airway        Dental   Pulmonary former smoker,           Cardiovascular hypertension,      Neuro/Psych    GI/Hepatic   Endo/Other  diabetes  Renal/GU      Musculoskeletal   Abdominal   Peds  Hematology   Anesthesia Other Findings   Reproductive/Obstetrics                             Anesthesia Physical Anesthesia Plan  ASA:   Anesthesia Plan:    Post-op Pain Management:    Induction:   PONV Risk Score and Plan:   Airway Management Planned:   Additional Equipment:   Intra-op Plan:   Post-operative Plan:   Informed Consent:   Plan Discussed with:   Anesthesia Plan Comments: (See PAT note written 06/16/2020 by Myra Gianotti, PA-C. If case remains scheduled for 06/19/20, then he is for CBC with ISTAT labs to recheck PLT count.  )        Anesthesia Quick Evaluation

## 2020-06-16 NOTE — Progress Notes (Signed)
Used [place] Interpreter [employer] ID [phone]     for PV call. No answer, did not leave message.   PCP - Dr Iona Beard [name] Cardiologist - Dr Cristopher Peru Pulmonary- Dr [name]  Chest x-ray - 01/11/20 (2V) EKG - 05/01/20 Stress Test - 01/10/20 ECHO - 01/09/20 Cardiac Cath - n/a  ICD Pacemaker/Loop- Yes IB message sent for Perioperative Cardiac Device Programming.  Email was sent to Hamel and Leanna Sato.  Last remote check was on 04/13/20.  Anesthesia review:  Yes  Coronavirus Screening Covid test scheduled on 06/16/20.

## 2020-06-16 NOTE — Progress Notes (Signed)
[facility] DEVICE PROGRAMMING   Patient Information: Name: James Nicholson  DOB: 03-08-1966  MRN: [id number]   Planned Procedure: Tunneled Dialysis Catheter Insertion Right Arm Fistula Plication  Surgeon: Dr Scot Dock  Date of Procedure: 06/19/20  Cautery will be used.  Position during surgery: Supine   Please send documentation back to:  [facility] (Fax # [phone])   Marveen Reeks, RN  06/16/2020 3:54 PM     Device Information:   Clinic EP Physician:   Cristopher Peru, MD Device Type:  Defibrillator Manufacturer and Phone #:  Medtronic: [id number] Pacemaker Dependent?:  No Date of Last Device Check:  04/13/20        Normal Device Function?:  Yes     Electrophysiologist's Recommendations:    Have magnet available.  Provide continuous ECG monitoring when magnet is used or reprogramming is to be performed.   Procedure may interfere with device function.  Magnet should be placed over device during procedure.  Per Device Clinic Standing Orders, Drake Leach  06/16/2020 4:24 PM

## 2020-06-16 NOTE — Progress Notes (Signed)
Anesthesia Chart Review: SAME DAY WORK-UP    Case: [id number] Date/Time: 06/19/20 0717   Procedures:      INSERTION OF DIALYSIS CATHETER (N/A )     PLICATION OF ARTERIOVENOUS FISTULA RIGHT (Right )   Anesthesia type: Choice   Pre-op diagnosis: esrd   Location: [facility] OR ROOM 12 / [place] OR   Surgeons: Angelia Mould, MD      DISCUSSION:   Patient is a 54 year old [place] speaking male scheduled for the above procedure.He has 2 aneurysmal segments of his right radiocephalic AVF and underwent plication of the first aneurysm on 05/08/20. He now needs plication of 2nd aneurysm and insertion of TDC to use until revised segment is ready to be accessed.    History includes former smoker(quit 2010), HTN,dyslipidemia, schizophrenia, atrial fibrillation,chronic systolic CHF,cardiomyopathy, V. tach arrest4/10/10 (s/p Medtronic ICD 01/2009; s/p extraction of previously implanted dual coil active-fixation defibrillation lead and insertion of a new single coil active-fixation defibrillation lead 01/10/20 due to undersensing/elevated pacing threshold likely due to scarring at that ventricular myocardial interface), ESRD (+p- ANCA necrotizing glomerulonephritis 2010 s/p IV Cytoxan and prednisone; hemodialysis since ~ 2014, TTS), anemia, Wegener's disease, IDDM, +PPD (9 months INH prescribed per Scharlene Gloss, MD 05/22/12), right eye proptosis (s/p anterior orbitotomy with biopsy showing active GPA, granulomatosis with polyangitis 05/14/20, Rx steroids, Rituxan).  - Admission [facility] 05/13/20-05/18/20 for right eye orbital mass with proptosis. CT showed large mass infiltrating the right orbit and rectus muscles, displacing optic nerve. S/p anterior orbitotomy with biopsy showing active granulomatosis with polyangitis (GPA) 05/14/20.  Rheumatology was consulted and recommended starting IV Solu-Medrol and adding Rituxan on an outpatient basis. Dermatology also did a punch biopsy of LLE rash with results "consistent with  suppurative folliculitis" and prescribed Silvadene cream to bilateral legs.  - Admission 01/07/20-01/11/20 for chest pain. He was already scheduled for ICD lead extraction/replacement on 01/10/20. HS Troponin I [id number]. Cardiology was consulted. Dr. Caryl Comes felt symptoms atypical, "unlikely cardiac.  Troponins are unrevealing." However, resting Myoview and echocardiogram ordered. Echo showed LVEF 30-35% (down from 45-50% in 2015) and severely reduced RV systolic function, RSVP 97.6 mmHg. Stress test was read as high risk due to large fixed anterolateral wall defect concerning for prior infarct, EF 45%, but no reversible ischemia. He underwent ICD lead exchange as planned.   Last cardiology visit was on 06/02/20 with Dr. Lovena Le.  No additional VF.  Device was working normally. Noted that patient my require reduction in b-blocker due to hypotension with dialysis. One year follow-up with Dr. Lovena Le with on-going remote ICD transmissions recommended.   Preoperative ICD Rx from still pending for [facility] EP.   06/16/20 presurgical COVID-19 test in in process.According to records in [place], he underwent Rituxan infusion on 06/09/20. Labs there showed PLT count 63K, down from 216K on 05/26/20. Thrombocytopenia likely related to Rituxan, but unsure of follow-up plans with rheumatology to see if PLT count stable. I contacted [facility] Rheumatology to inquire, but as of yet have not received a return call. I did review with anesthesiologist Candida Peeling, MD who is in agreement with getting CBC with labs on the day of surgery. Unless PLT count significantly worse then could likely proceed as planned from an anesthesia standpoint if otherwise no acute changes. However, after communication with Dr. Scot Dock he is considering delaying case until patient has had follow-up labs showing improving thrombocytopenia.    VS:  BP Readings from Last 3 Encounters:  06/14/20 (!) 166/81  06/02/20 138/78  05/08/20  122/78   Pulse Readings from Last 3 Encounters:  06/14/20 70  06/02/20 70  05/08/20 75    PROVIDERS: [name], MDis PCP  Cristopher Peru, MD is EP cardiologist Nephrologist is with [facility] Brand Males, MD is pulmonologist Hermelinda Medicus, MD is rheumatologist (scheduled for initial consult on 07/24/20) Kerrie Buffalo, MD is ophthalmologist ([place])   LABS: For day of surgery. He had labs on 06/09/20 for Rituxan treatment at [facility] with results showing: AST 47, ALT 61, H/H 11.0/34.3, PLT down to 63K (from 216K 06/02/20)   IMAGES: CXR 05/16/20 [facility]) IMPRESSION: - Mild interstitial edema with lower lobe atelectasis.  - The cardiac silhouette is prominent size in which an underlying pericardial effusion or cardiomegaly may be present. Consider correlation with echocardiography.   EKG:06/02/20 ([facility]): NSR with RBBB, LAFB   CV: Nuclear stress tet 01/10/20: IMPRESSION: 1. Large fixed defect along the anterolateral wall the left ventricle, raising concern for prior infarction. No reversible ischemia. 2. Mild global hypokinesis with associated left ventricular dilatation. 3. Left ventricular ejection fraction 45% 4. Non invasive risk stratification*: High *2012 Appropriate Use Criteria for Coronary Revascularization Focused Update: J Am Coll Cardiol. 3220;25(4):270-623. http://content.airportbarriers.com.aspx?articleid=1201161  Echo 01/09/20: IMPRESSIONS  1. Severe biventricular failure.  2. Left ventricular ejection fraction, by estimation, is 30 to 35%. The  left ventricle has moderately decreased function. The left ventricle has  no regional wall motion abnormalities. The left ventricular internal  cavity size was moderately dilated.  Left ventricular diastolic parameters are consistent with Grade II  diastolic dysfunction (pseudonormalization). Elevated left atrial  pressure.  3. Right ventricular  systolic function is severely reduced. The right  ventricular size is severely enlarged. There is mildly elevated pulmonary  artery systolic pressure. The estimated right ventricular systolic  pressure is 76.2 mmHg.  4. Left atrial size was moderately dilated.  5. Right atrial size was moderately dilated.  6. The mitral valve is normal in structure. Mild mitral valve  regurgitation. No evidence of mitral stenosis.  7. The aortic valve is normal in structure. Aortic valve regurgitation is  not visualized. No aortic stenosis is present.  8. The inferior vena cava is normal in size with greater than 50%  respiratory variability, suggesting right atrial pressure of 3 mmHg.  (Comparison echo 12/23/13: LVEF 45-50%, diffuse hypokinesis, grade 1 DD, mild-moderate MR)    Past Medical History:  Diagnosis Date  . AICD (automatic cardioverter/defibrillator) present    Medtronic Visia AF MRIT VR [id number]  . Anemia   . Atrial fibrillation   . Cardiomyopathy ([place]) 2010   Unclear Etiology: Last Echo 06/2009: EF 40-45%, severe Lateral & apical Hypokinesis (? CAD) ; Grade 2 DDysfxn. Mild conc LVH.   Marland Kitchen Cellulitis   . Chronic kidney disease (CKD), stage V ([place])    Dialysis Tue, Thurs, Sat  . Depression    no meds  . Diabetes mellitus without complication ([place])    type 2, diet controlled, no meds, pt. denies  . Dyslipidemia   . Enterobacter sepsis ([place])   . HLD (hyperlipidemia)   . Hypertension   . Pneumonia   . S/P ICD (internal cardiac defibrillator) procedure 2010   VT Arrest (in [place])  . Schizophrenia ([place])    no meds  . Wegener's disease, pulmonary ([place]) 02/05/2013  . Wegener's granulomatosis ([place])     Past Surgical History:  Procedure Laterality Date  . A/V FISTULAGRAM Right 03/17/2020   Procedure: A/V FISTULAGRAM;  Surgeon: Angelia Mould, MD;  Location: [place] CV LAB;  Service: Cardiovascular;  Laterality: Right;  . AV FISTULA PLACEMENT Right 02/10/2013    Procedure: ARTERIOVENOUS (AV) FISTULA CREATION;  Surgeon: Rosetta Posner, MD;  Location: [facility] OR;  Service: Vascular;  Laterality: Right;  Right forearm radial/cephalic arterovenous fistula.   Marland Kitchen CARDIAC CATHETERIZATION  2010   [place]: In setting of VT arrest. Per brother's report, nonobstructive with no intervention  . CARDIAC DEFIBRILLATOR PLACEMENT  2010   [place]  . FISTULOGRAM Right 08/09/2013   Procedure: FISTULOGRAM;  Surgeon: Angelia Mould, MD;  Location: [facility] CATH LAB;  Service: Cardiovascular;  Laterality: Right;  . ICD GENERATOR CHANGEOUT N/A 12/05/2017   Procedure: ICD GENERATOR CHANGEOUT;  Surgeon: Evans Lance, MD;  Location: [facility] CV LAB;  Service: Cardiovascular;  Laterality: N/A;  . ICD LEAD REMOVAL N/A 01/10/2020   Procedure: ICD LEAD REMOVAL AND IMPLANTATION OF NEW LEAD IN RIGHT VENTRICLE;  Surgeon: Evans Lance, MD;  Location: [facility] OR;  Service: Cardiovascular;  Laterality: N/A;  . LIGATION OF ARTERIOVENOUS  FISTULA Right 0/05/6760   Procedure: PLICATION OF ARTERIOVENOUS  FISTULA RIGHT ARM;  Surgeon: Angelia Mould, MD;  Location: [place];  Service: Vascular;  Laterality: Right;  . LIGATION OF COMPETING BRANCHES OF ARTERIOVENOUS FISTULA Right 08/13/2013   Procedure: LIGATION OF COMPETING BRANCHES X5 OF ARTERIOVENOUS FISTULA- RIGHT ARM;  Surgeon: Serafina Mitchell, MD;  Location: [facility];  Service: Vascular;  Laterality: Right;  . TEE WITHOUT CARDIOVERSION N/A 01/10/2020   Procedure: TRANSESOPHAGEAL ECHOCARDIOGRAM (TEE);  Surgeon: Evans Lance, MD;  Location: [facility] OR;  Service: Cardiovascular;  Laterality: N/A;    MEDICATIONS: No current facility-administered medications for this encounter.   Marland Kitchen atorvastatin (LIPITOR) 20 MG tablet  . AURYXIA 1 GM 210 MG(Fe) tablet  . calcium acetate (PHOSLO) 667 MG capsule  . gabapentin (NEURONTIN) 300 MG capsule  . metoprolol succinate (TOPROL-XL) 50 MG 24 hr tablet  . oxyCODONE (ROXICODONE) 5 MG immediate release tablet  .  predniSONE (DELTASONE) 20 MG tablet  . risperiDONE (RISPERDAL) 0.25 MG tablet  . sodium zirconium cyclosilicate (LOKELMA) 5 g packet  . traZODone (DESYREL) 100 MG tablet    Myra Gianotti, PA-C Surgical Short Stay/Anesthesiology [facility] Phone [phone] [facility] Phone [phone] 06/16/2020 4:26 PM

## 2020-06-16 NOTE — Progress Notes (Signed)
error 

## 2020-06-17 DIAGNOSIS — E1122 Type 2 diabetes mellitus with diabetic chronic kidney disease: Secondary | ICD-10-CM | POA: Diagnosis not present

## 2020-06-17 DIAGNOSIS — D631 Anemia in chronic kidney disease: Secondary | ICD-10-CM | POA: Diagnosis not present

## 2020-06-17 DIAGNOSIS — Z992 Dependence on renal dialysis: Secondary | ICD-10-CM | POA: Diagnosis not present

## 2020-06-17 DIAGNOSIS — N2581 Secondary hyperparathyroidism of renal origin: Secondary | ICD-10-CM | POA: Diagnosis not present

## 2020-06-17 DIAGNOSIS — N186 End stage renal disease: Secondary | ICD-10-CM | POA: Diagnosis not present

## 2020-06-17 DIAGNOSIS — D509 Iron deficiency anemia, unspecified: Secondary | ICD-10-CM | POA: Diagnosis not present

## 2020-06-19 ENCOUNTER — Ambulatory Visit (HOSPITAL_COMMUNITY): Admission: RE | Admit: 2020-06-19 | Payer: Medicare Other | Source: Home / Self Care | Admitting: Vascular Surgery

## 2020-06-19 SURGERY — INSERTION OF DIALYSIS CATHETER
Anesthesia: Choice | Laterality: Right

## 2020-06-20 DIAGNOSIS — N186 End stage renal disease: Secondary | ICD-10-CM | POA: Diagnosis not present

## 2020-06-20 DIAGNOSIS — E1122 Type 2 diabetes mellitus with diabetic chronic kidney disease: Secondary | ICD-10-CM | POA: Diagnosis not present

## 2020-06-20 DIAGNOSIS — D509 Iron deficiency anemia, unspecified: Secondary | ICD-10-CM | POA: Diagnosis not present

## 2020-06-20 DIAGNOSIS — D631 Anemia in chronic kidney disease: Secondary | ICD-10-CM | POA: Diagnosis not present

## 2020-06-20 DIAGNOSIS — Z992 Dependence on renal dialysis: Secondary | ICD-10-CM | POA: Diagnosis not present

## 2020-06-20 DIAGNOSIS — N2581 Secondary hyperparathyroidism of renal origin: Secondary | ICD-10-CM | POA: Diagnosis not present

## 2020-06-21 DIAGNOSIS — M317 Microscopic polyangiitis: Secondary | ICD-10-CM | POA: Diagnosis not present

## 2020-06-21 DIAGNOSIS — M3131 Wegener's granulomatosis with renal involvement: Secondary | ICD-10-CM | POA: Diagnosis not present

## 2020-06-21 DIAGNOSIS — Z48814 Encounter for surgical aftercare following surgery on the teeth or oral cavity: Secondary | ICD-10-CM | POA: Diagnosis not present

## 2020-06-21 DIAGNOSIS — Z7952 Long term (current) use of systemic steroids: Secondary | ICD-10-CM | POA: Diagnosis not present

## 2020-06-21 DIAGNOSIS — R21 Rash and other nonspecific skin eruption: Secondary | ICD-10-CM | POA: Diagnosis not present

## 2020-06-21 DIAGNOSIS — R4584 Anhedonia: Secondary | ICD-10-CM | POA: Diagnosis not present

## 2020-06-21 DIAGNOSIS — Z79899 Other long term (current) drug therapy: Secondary | ICD-10-CM | POA: Diagnosis not present

## 2020-06-22 DIAGNOSIS — N2581 Secondary hyperparathyroidism of renal origin: Secondary | ICD-10-CM | POA: Diagnosis not present

## 2020-06-22 DIAGNOSIS — Z992 Dependence on renal dialysis: Secondary | ICD-10-CM | POA: Diagnosis not present

## 2020-06-22 DIAGNOSIS — D509 Iron deficiency anemia, unspecified: Secondary | ICD-10-CM | POA: Diagnosis not present

## 2020-06-22 DIAGNOSIS — D631 Anemia in chronic kidney disease: Secondary | ICD-10-CM | POA: Diagnosis not present

## 2020-06-22 DIAGNOSIS — N186 End stage renal disease: Secondary | ICD-10-CM | POA: Diagnosis not present

## 2020-06-22 DIAGNOSIS — E1122 Type 2 diabetes mellitus with diabetic chronic kidney disease: Secondary | ICD-10-CM | POA: Diagnosis not present

## 2020-06-23 DIAGNOSIS — N281 Cyst of kidney, acquired: Secondary | ICD-10-CM | POA: Diagnosis not present

## 2020-06-23 DIAGNOSIS — K7689 Other specified diseases of liver: Secondary | ICD-10-CM | POA: Diagnosis not present

## 2020-06-24 DIAGNOSIS — D631 Anemia in chronic kidney disease: Secondary | ICD-10-CM | POA: Diagnosis not present

## 2020-06-24 DIAGNOSIS — E1122 Type 2 diabetes mellitus with diabetic chronic kidney disease: Secondary | ICD-10-CM | POA: Diagnosis not present

## 2020-06-24 DIAGNOSIS — N186 End stage renal disease: Secondary | ICD-10-CM | POA: Diagnosis not present

## 2020-06-24 DIAGNOSIS — D509 Iron deficiency anemia, unspecified: Secondary | ICD-10-CM | POA: Diagnosis not present

## 2020-06-24 DIAGNOSIS — N2581 Secondary hyperparathyroidism of renal origin: Secondary | ICD-10-CM | POA: Diagnosis not present

## 2020-06-24 DIAGNOSIS — Z992 Dependence on renal dialysis: Secondary | ICD-10-CM | POA: Diagnosis not present

## 2020-06-27 ENCOUNTER — Telehealth: Payer: Self-pay

## 2020-06-27 DIAGNOSIS — D631 Anemia in chronic kidney disease: Secondary | ICD-10-CM | POA: Diagnosis not present

## 2020-06-27 DIAGNOSIS — E1122 Type 2 diabetes mellitus with diabetic chronic kidney disease: Secondary | ICD-10-CM | POA: Diagnosis not present

## 2020-06-27 DIAGNOSIS — N186 End stage renal disease: Secondary | ICD-10-CM | POA: Diagnosis not present

## 2020-06-27 DIAGNOSIS — N2581 Secondary hyperparathyroidism of renal origin: Secondary | ICD-10-CM | POA: Diagnosis not present

## 2020-06-27 DIAGNOSIS — D696 Thrombocytopenia, unspecified: Secondary | ICD-10-CM

## 2020-06-27 DIAGNOSIS — Z992 Dependence on renal dialysis: Secondary | ICD-10-CM | POA: Diagnosis not present

## 2020-06-27 DIAGNOSIS — D509 Iron deficiency anemia, unspecified: Secondary | ICD-10-CM | POA: Diagnosis not present

## 2020-06-27 NOTE — Telephone Encounter (Signed)
Platelet count ordered to be checked as outpatient per Dr. Scot Dock for week of 07/03/20 due to pt's low platelet count. If count looks good, will reschedule pt for [facility] and plication of Rt AVF. Pt's brother Velcro voiced understanding and will take pt to [facility].

## 2020-06-28 DIAGNOSIS — N032 Chronic nephritic syndrome with diffuse membranous glomerulonephritis: Secondary | ICD-10-CM | POA: Diagnosis not present

## 2020-06-28 DIAGNOSIS — N186 End stage renal disease: Secondary | ICD-10-CM | POA: Diagnosis not present

## 2020-06-28 DIAGNOSIS — Z992 Dependence on renal dialysis: Secondary | ICD-10-CM | POA: Diagnosis not present

## 2020-06-29 DIAGNOSIS — Z23 Encounter for immunization: Secondary | ICD-10-CM | POA: Diagnosis not present

## 2020-06-29 DIAGNOSIS — N2581 Secondary hyperparathyroidism of renal origin: Secondary | ICD-10-CM | POA: Diagnosis not present

## 2020-06-29 DIAGNOSIS — Z992 Dependence on renal dialysis: Secondary | ICD-10-CM | POA: Diagnosis not present

## 2020-06-29 DIAGNOSIS — D509 Iron deficiency anemia, unspecified: Secondary | ICD-10-CM | POA: Diagnosis not present

## 2020-06-29 DIAGNOSIS — N186 End stage renal disease: Secondary | ICD-10-CM | POA: Diagnosis not present

## 2020-06-29 DIAGNOSIS — D631 Anemia in chronic kidney disease: Secondary | ICD-10-CM | POA: Diagnosis not present

## 2020-06-29 DIAGNOSIS — E1122 Type 2 diabetes mellitus with diabetic chronic kidney disease: Secondary | ICD-10-CM | POA: Diagnosis not present

## 2020-07-01 DIAGNOSIS — D509 Iron deficiency anemia, unspecified: Secondary | ICD-10-CM | POA: Diagnosis not present

## 2020-07-01 DIAGNOSIS — N186 End stage renal disease: Secondary | ICD-10-CM | POA: Diagnosis not present

## 2020-07-01 DIAGNOSIS — N2581 Secondary hyperparathyroidism of renal origin: Secondary | ICD-10-CM | POA: Diagnosis not present

## 2020-07-01 DIAGNOSIS — E1122 Type 2 diabetes mellitus with diabetic chronic kidney disease: Secondary | ICD-10-CM | POA: Diagnosis not present

## 2020-07-01 DIAGNOSIS — D631 Anemia in chronic kidney disease: Secondary | ICD-10-CM | POA: Diagnosis not present

## 2020-07-01 DIAGNOSIS — Z992 Dependence on renal dialysis: Secondary | ICD-10-CM | POA: Diagnosis not present

## 2020-07-04 DIAGNOSIS — D631 Anemia in chronic kidney disease: Secondary | ICD-10-CM | POA: Diagnosis not present

## 2020-07-04 DIAGNOSIS — E1122 Type 2 diabetes mellitus with diabetic chronic kidney disease: Secondary | ICD-10-CM | POA: Diagnosis not present

## 2020-07-04 DIAGNOSIS — D509 Iron deficiency anemia, unspecified: Secondary | ICD-10-CM | POA: Diagnosis not present

## 2020-07-04 DIAGNOSIS — N2581 Secondary hyperparathyroidism of renal origin: Secondary | ICD-10-CM | POA: Diagnosis not present

## 2020-07-04 DIAGNOSIS — N186 End stage renal disease: Secondary | ICD-10-CM | POA: Diagnosis not present

## 2020-07-04 DIAGNOSIS — Z992 Dependence on renal dialysis: Secondary | ICD-10-CM | POA: Diagnosis not present

## 2020-07-06 DIAGNOSIS — N186 End stage renal disease: Secondary | ICD-10-CM | POA: Diagnosis not present

## 2020-07-06 DIAGNOSIS — D631 Anemia in chronic kidney disease: Secondary | ICD-10-CM | POA: Diagnosis not present

## 2020-07-06 DIAGNOSIS — D509 Iron deficiency anemia, unspecified: Secondary | ICD-10-CM | POA: Diagnosis not present

## 2020-07-06 DIAGNOSIS — N2581 Secondary hyperparathyroidism of renal origin: Secondary | ICD-10-CM | POA: Diagnosis not present

## 2020-07-06 DIAGNOSIS — E1122 Type 2 diabetes mellitus with diabetic chronic kidney disease: Secondary | ICD-10-CM | POA: Diagnosis not present

## 2020-07-06 DIAGNOSIS — Z992 Dependence on renal dialysis: Secondary | ICD-10-CM | POA: Diagnosis not present

## 2020-07-07 ENCOUNTER — Other Ambulatory Visit: Payer: Self-pay | Admitting: Vascular Surgery

## 2020-07-07 DIAGNOSIS — Z79899 Other long term (current) drug therapy: Secondary | ICD-10-CM | POA: Diagnosis not present

## 2020-07-07 DIAGNOSIS — R5383 Other fatigue: Secondary | ICD-10-CM | POA: Diagnosis not present

## 2020-07-07 DIAGNOSIS — R945 Abnormal results of liver function studies: Secondary | ICD-10-CM | POA: Diagnosis not present

## 2020-07-07 DIAGNOSIS — K921 Melena: Secondary | ICD-10-CM | POA: Diagnosis not present

## 2020-07-08 DIAGNOSIS — Z992 Dependence on renal dialysis: Secondary | ICD-10-CM | POA: Diagnosis not present

## 2020-07-08 DIAGNOSIS — N186 End stage renal disease: Secondary | ICD-10-CM | POA: Diagnosis not present

## 2020-07-08 DIAGNOSIS — D631 Anemia in chronic kidney disease: Secondary | ICD-10-CM | POA: Diagnosis not present

## 2020-07-08 DIAGNOSIS — N2581 Secondary hyperparathyroidism of renal origin: Secondary | ICD-10-CM | POA: Diagnosis not present

## 2020-07-08 DIAGNOSIS — D509 Iron deficiency anemia, unspecified: Secondary | ICD-10-CM | POA: Diagnosis not present

## 2020-07-08 DIAGNOSIS — E1122 Type 2 diabetes mellitus with diabetic chronic kidney disease: Secondary | ICD-10-CM | POA: Diagnosis not present

## 2020-07-08 LAB — COMPREHENSIVE METABOLIC PANEL
ALT: 29 IU/L (ref 0–44)
AST: 13 IU/L (ref 0–40)
Albumin/Globulin Ratio: 1.8 (ref 1.2–2.2)
Albumin: 4 g/dL (ref 3.8–4.9)
Alkaline Phosphatase: 91 IU/L (ref 48–121)
BUN/Creatinine Ratio: 6 — ABNORMAL LOW (ref 9–20)
BUN: 37 mg/dL — ABNORMAL HIGH (ref 6–24)
Bilirubin Total: 0.5 mg/dL (ref 0.0–1.2)
CO2: 32 mmol/L — ABNORMAL HIGH (ref 20–29)
Calcium: 9.5 mg/dL (ref 8.7–10.2)
Chloride: 95 mmol/L — ABNORMAL LOW (ref 96–106)
Creatinine, Ser: 6.34 mg/dL — ABNORMAL HIGH (ref 0.76–1.27)
GFR calc Af Amer: 11 mL/min/{1.73_m2} — ABNORMAL LOW (ref >59)
GFR calc non Af Amer: 9 mL/min/{1.73_m2} — ABNORMAL LOW (ref >59)
Globulin, Total: 2.2 g/dL (ref 1.5–4.5)
Glucose: 73 mg/dL (ref 65–99)
Potassium: 4.5 mmol/L (ref 3.5–5.2)
Sodium: 141 mmol/L (ref 134–144)
Total Protein: 6.2 g/dL (ref 6.0–8.5)

## 2020-07-08 LAB — CBC WITH DIFFERENTIAL/PLATELET
Basophils Absolute: 0.1 10*3/uL (ref 0.0–0.2)
Basos: 1 %
EOS (ABSOLUTE): 0.6 10*3/uL — ABNORMAL HIGH (ref 0.0–0.4)
Eos: 6 %
Hematocrit: 41.9 % (ref 37.5–51.0)
Hemoglobin: 12.6 g/dL — ABNORMAL LOW (ref 13.0–17.7)
Immature Grans (Abs): 0 10*3/uL (ref 0.0–0.1)
Immature Granulocytes: 0 %
Lymphocytes Absolute: 2.8 10*3/uL (ref 0.7–3.1)
Lymphs: 31 %
MCH: 27.9 pg (ref 26.6–33.0)
MCHC: 30.1 g/dL — ABNORMAL LOW (ref 31.5–35.7)
MCV: 93 fL (ref 79–97)
Monocytes Absolute: 0.8 10*3/uL (ref 0.1–0.9)
Monocytes: 9 %
Neutrophils Absolute: 4.8 10*3/uL (ref 1.4–7.0)
Neutrophils: 53 %
Platelets: 202 10*3/uL (ref 150–450)
RBC: 4.52 x10E6/uL (ref 4.14–5.80)
RDW: 19.3 % — ABNORMAL HIGH (ref 11.6–15.4)
WBC: 9.1 10*3/uL (ref 3.4–10.8)

## 2020-07-11 DIAGNOSIS — D631 Anemia in chronic kidney disease: Secondary | ICD-10-CM | POA: Diagnosis not present

## 2020-07-11 DIAGNOSIS — Z992 Dependence on renal dialysis: Secondary | ICD-10-CM | POA: Diagnosis not present

## 2020-07-11 DIAGNOSIS — D509 Iron deficiency anemia, unspecified: Secondary | ICD-10-CM | POA: Diagnosis not present

## 2020-07-11 DIAGNOSIS — N186 End stage renal disease: Secondary | ICD-10-CM | POA: Diagnosis not present

## 2020-07-11 DIAGNOSIS — E1122 Type 2 diabetes mellitus with diabetic chronic kidney disease: Secondary | ICD-10-CM | POA: Diagnosis not present

## 2020-07-11 DIAGNOSIS — N2581 Secondary hyperparathyroidism of renal origin: Secondary | ICD-10-CM | POA: Diagnosis not present

## 2020-07-12 DIAGNOSIS — H0589 Other disorders of orbit: Secondary | ICD-10-CM | POA: Diagnosis not present

## 2020-07-12 DIAGNOSIS — H0520 Unspecified exophthalmos: Secondary | ICD-10-CM | POA: Diagnosis not present

## 2020-07-12 DIAGNOSIS — M3131 Wegener's granulomatosis with renal involvement: Secondary | ICD-10-CM | POA: Diagnosis not present

## 2020-07-13 ENCOUNTER — Ambulatory Visit (INDEPENDENT_AMBULATORY_CARE_PROVIDER_SITE_OTHER): Payer: Medicare Other | Admitting: *Deleted

## 2020-07-13 DIAGNOSIS — E1122 Type 2 diabetes mellitus with diabetic chronic kidney disease: Secondary | ICD-10-CM | POA: Diagnosis not present

## 2020-07-13 DIAGNOSIS — N2581 Secondary hyperparathyroidism of renal origin: Secondary | ICD-10-CM | POA: Diagnosis not present

## 2020-07-13 DIAGNOSIS — Z992 Dependence on renal dialysis: Secondary | ICD-10-CM | POA: Diagnosis not present

## 2020-07-13 DIAGNOSIS — D631 Anemia in chronic kidney disease: Secondary | ICD-10-CM | POA: Diagnosis not present

## 2020-07-13 DIAGNOSIS — I255 Ischemic cardiomyopathy: Secondary | ICD-10-CM

## 2020-07-13 DIAGNOSIS — N186 End stage renal disease: Secondary | ICD-10-CM | POA: Diagnosis not present

## 2020-07-13 DIAGNOSIS — D509 Iron deficiency anemia, unspecified: Secondary | ICD-10-CM | POA: Diagnosis not present

## 2020-07-13 LAB — CUP PACEART REMOTE DEVICE CHECK
Battery Remaining Longevity: 103 mo
Battery Voltage: 2.99 V
Brady Statistic RV Percent Paced: 0.01 %
Date Time Interrogation Session: 20210916162508
HighPow Impedance: 254 Ohm
HighPow Impedance: 40 Ohm
Implantable Lead Implant Date: 20210315
Implantable Lead Location: 753860
Implantable Lead Model: 6935
Implantable Pulse Generator Implant Date: 20190208
Lead Channel Impedance Value: 304 Ohm
Lead Channel Impedance Value: 418 Ohm
Lead Channel Pacing Threshold Amplitude: 0.75 V
Lead Channel Pacing Threshold Pulse Width: 0.4 ms
Lead Channel Sensing Intrinsic Amplitude: 9.5 mV
Lead Channel Sensing Intrinsic Amplitude: 9.5 mV
Lead Channel Setting Pacing Amplitude: 2 V
Lead Channel Setting Pacing Pulse Width: 0.4 ms
Lead Channel Setting Sensing Sensitivity: 0.3 mV

## 2020-07-15 DIAGNOSIS — N2581 Secondary hyperparathyroidism of renal origin: Secondary | ICD-10-CM | POA: Diagnosis not present

## 2020-07-15 DIAGNOSIS — E1122 Type 2 diabetes mellitus with diabetic chronic kidney disease: Secondary | ICD-10-CM | POA: Diagnosis not present

## 2020-07-15 DIAGNOSIS — N186 End stage renal disease: Secondary | ICD-10-CM | POA: Diagnosis not present

## 2020-07-15 DIAGNOSIS — D509 Iron deficiency anemia, unspecified: Secondary | ICD-10-CM | POA: Diagnosis not present

## 2020-07-15 DIAGNOSIS — Z992 Dependence on renal dialysis: Secondary | ICD-10-CM | POA: Diagnosis not present

## 2020-07-15 DIAGNOSIS — D631 Anemia in chronic kidney disease: Secondary | ICD-10-CM | POA: Diagnosis not present

## 2020-07-17 NOTE — Progress Notes (Signed)
Remote ICD transmission.   

## 2020-07-18 DIAGNOSIS — D509 Iron deficiency anemia, unspecified: Secondary | ICD-10-CM | POA: Diagnosis not present

## 2020-07-18 DIAGNOSIS — Z992 Dependence on renal dialysis: Secondary | ICD-10-CM | POA: Diagnosis not present

## 2020-07-18 DIAGNOSIS — N2581 Secondary hyperparathyroidism of renal origin: Secondary | ICD-10-CM | POA: Diagnosis not present

## 2020-07-18 DIAGNOSIS — N186 End stage renal disease: Secondary | ICD-10-CM | POA: Diagnosis not present

## 2020-07-18 DIAGNOSIS — E1122 Type 2 diabetes mellitus with diabetic chronic kidney disease: Secondary | ICD-10-CM | POA: Diagnosis not present

## 2020-07-18 DIAGNOSIS — D631 Anemia in chronic kidney disease: Secondary | ICD-10-CM | POA: Diagnosis not present

## 2020-07-19 DIAGNOSIS — T7840XA Allergy, unspecified, initial encounter: Secondary | ICD-10-CM | POA: Insufficient documentation

## 2020-07-19 DIAGNOSIS — T782XXA Anaphylactic shock, unspecified, initial encounter: Secondary | ICD-10-CM | POA: Insufficient documentation

## 2020-07-20 DIAGNOSIS — D509 Iron deficiency anemia, unspecified: Secondary | ICD-10-CM | POA: Diagnosis not present

## 2020-07-20 DIAGNOSIS — D631 Anemia in chronic kidney disease: Secondary | ICD-10-CM | POA: Diagnosis not present

## 2020-07-20 DIAGNOSIS — N2581 Secondary hyperparathyroidism of renal origin: Secondary | ICD-10-CM | POA: Diagnosis not present

## 2020-07-20 DIAGNOSIS — E1122 Type 2 diabetes mellitus with diabetic chronic kidney disease: Secondary | ICD-10-CM | POA: Diagnosis not present

## 2020-07-20 DIAGNOSIS — Z992 Dependence on renal dialysis: Secondary | ICD-10-CM | POA: Diagnosis not present

## 2020-07-20 DIAGNOSIS — N186 End stage renal disease: Secondary | ICD-10-CM | POA: Diagnosis not present

## 2020-07-22 DIAGNOSIS — D509 Iron deficiency anemia, unspecified: Secondary | ICD-10-CM | POA: Diagnosis not present

## 2020-07-22 DIAGNOSIS — N2581 Secondary hyperparathyroidism of renal origin: Secondary | ICD-10-CM | POA: Diagnosis not present

## 2020-07-22 DIAGNOSIS — Z992 Dependence on renal dialysis: Secondary | ICD-10-CM | POA: Diagnosis not present

## 2020-07-22 DIAGNOSIS — E1122 Type 2 diabetes mellitus with diabetic chronic kidney disease: Secondary | ICD-10-CM | POA: Diagnosis not present

## 2020-07-22 DIAGNOSIS — N186 End stage renal disease: Secondary | ICD-10-CM | POA: Diagnosis not present

## 2020-07-22 DIAGNOSIS — D631 Anemia in chronic kidney disease: Secondary | ICD-10-CM | POA: Diagnosis not present

## 2020-07-25 DIAGNOSIS — D509 Iron deficiency anemia, unspecified: Secondary | ICD-10-CM | POA: Diagnosis not present

## 2020-07-25 DIAGNOSIS — N186 End stage renal disease: Secondary | ICD-10-CM | POA: Diagnosis not present

## 2020-07-25 DIAGNOSIS — N2581 Secondary hyperparathyroidism of renal origin: Secondary | ICD-10-CM | POA: Diagnosis not present

## 2020-07-25 DIAGNOSIS — E1122 Type 2 diabetes mellitus with diabetic chronic kidney disease: Secondary | ICD-10-CM | POA: Diagnosis not present

## 2020-07-25 DIAGNOSIS — Z992 Dependence on renal dialysis: Secondary | ICD-10-CM | POA: Diagnosis not present

## 2020-07-25 DIAGNOSIS — D631 Anemia in chronic kidney disease: Secondary | ICD-10-CM | POA: Diagnosis not present

## 2020-07-27 DIAGNOSIS — E1122 Type 2 diabetes mellitus with diabetic chronic kidney disease: Secondary | ICD-10-CM | POA: Diagnosis not present

## 2020-07-27 DIAGNOSIS — Z992 Dependence on renal dialysis: Secondary | ICD-10-CM | POA: Diagnosis not present

## 2020-07-27 DIAGNOSIS — D631 Anemia in chronic kidney disease: Secondary | ICD-10-CM | POA: Diagnosis not present

## 2020-07-27 DIAGNOSIS — N186 End stage renal disease: Secondary | ICD-10-CM | POA: Diagnosis not present

## 2020-07-27 DIAGNOSIS — N2581 Secondary hyperparathyroidism of renal origin: Secondary | ICD-10-CM | POA: Diagnosis not present

## 2020-07-27 DIAGNOSIS — D509 Iron deficiency anemia, unspecified: Secondary | ICD-10-CM | POA: Diagnosis not present

## 2020-07-28 DIAGNOSIS — N186 End stage renal disease: Secondary | ICD-10-CM | POA: Diagnosis not present

## 2020-07-28 DIAGNOSIS — Z992 Dependence on renal dialysis: Secondary | ICD-10-CM | POA: Diagnosis not present

## 2020-07-28 DIAGNOSIS — N032 Chronic nephritic syndrome with diffuse membranous glomerulonephritis: Secondary | ICD-10-CM | POA: Diagnosis not present

## 2020-07-29 DIAGNOSIS — N2581 Secondary hyperparathyroidism of renal origin: Secondary | ICD-10-CM | POA: Diagnosis not present

## 2020-07-29 DIAGNOSIS — E1122 Type 2 diabetes mellitus with diabetic chronic kidney disease: Secondary | ICD-10-CM | POA: Diagnosis not present

## 2020-07-29 DIAGNOSIS — Z992 Dependence on renal dialysis: Secondary | ICD-10-CM | POA: Diagnosis not present

## 2020-07-29 DIAGNOSIS — N186 End stage renal disease: Secondary | ICD-10-CM | POA: Diagnosis not present

## 2020-07-29 DIAGNOSIS — D631 Anemia in chronic kidney disease: Secondary | ICD-10-CM | POA: Diagnosis not present

## 2020-07-29 DIAGNOSIS — D509 Iron deficiency anemia, unspecified: Secondary | ICD-10-CM | POA: Diagnosis not present

## 2020-08-01 DIAGNOSIS — N2581 Secondary hyperparathyroidism of renal origin: Secondary | ICD-10-CM | POA: Diagnosis not present

## 2020-08-01 DIAGNOSIS — Z992 Dependence on renal dialysis: Secondary | ICD-10-CM | POA: Diagnosis not present

## 2020-08-01 DIAGNOSIS — N186 End stage renal disease: Secondary | ICD-10-CM | POA: Diagnosis not present

## 2020-08-01 DIAGNOSIS — E1122 Type 2 diabetes mellitus with diabetic chronic kidney disease: Secondary | ICD-10-CM | POA: Diagnosis not present

## 2020-08-01 DIAGNOSIS — D631 Anemia in chronic kidney disease: Secondary | ICD-10-CM | POA: Diagnosis not present

## 2020-08-01 DIAGNOSIS — D509 Iron deficiency anemia, unspecified: Secondary | ICD-10-CM | POA: Diagnosis not present

## 2020-08-03 DIAGNOSIS — Z992 Dependence on renal dialysis: Secondary | ICD-10-CM | POA: Diagnosis not present

## 2020-08-03 DIAGNOSIS — E1122 Type 2 diabetes mellitus with diabetic chronic kidney disease: Secondary | ICD-10-CM | POA: Diagnosis not present

## 2020-08-03 DIAGNOSIS — D631 Anemia in chronic kidney disease: Secondary | ICD-10-CM | POA: Diagnosis not present

## 2020-08-03 DIAGNOSIS — D509 Iron deficiency anemia, unspecified: Secondary | ICD-10-CM | POA: Diagnosis not present

## 2020-08-03 DIAGNOSIS — N2581 Secondary hyperparathyroidism of renal origin: Secondary | ICD-10-CM | POA: Diagnosis not present

## 2020-08-03 DIAGNOSIS — N186 End stage renal disease: Secondary | ICD-10-CM | POA: Diagnosis not present

## 2020-08-04 ENCOUNTER — Other Ambulatory Visit (HOSPITAL_COMMUNITY)
Admission: RE | Admit: 2020-08-04 | Discharge: 2020-08-04 | Disposition: A | Discharge status: Home / Self Care | Payer: Medicare Other | Source: Ambulatory Visit | Attending: Vascular Surgery | Admitting: Vascular Surgery

## 2020-08-04 ENCOUNTER — Encounter (HOSPITAL_COMMUNITY): Payer: Self-pay | Admitting: Vascular Surgery

## 2020-08-04 ENCOUNTER — Encounter: Payer: Self-pay | Admitting: Internal Medicine

## 2020-08-04 ENCOUNTER — Other Ambulatory Visit: Payer: Self-pay

## 2020-08-04 DIAGNOSIS — Z20822 Contact with and (suspected) exposure to covid-19: Secondary | ICD-10-CM | POA: Diagnosis not present

## 2020-08-04 DIAGNOSIS — Z01812 Encounter for preprocedural laboratory examination: Secondary | ICD-10-CM | POA: Diagnosis not present

## 2020-08-04 LAB — SARS CORONAVIRUS 2 (TAT 6-24 HRS): SARS Coronavirus 2: NEGATIVE

## 2020-08-04 NOTE — Progress Notes (Unsigned)
[place] DEVICE PROGRAMMING   Patient Information: Name: James Nicholson   DOB: 12/22/1965  MRN: [id number]   Planned Procedure: PLICATION OF ARTERIOVENOUS FISTULA RIGHT and INSERTION OF DIALYSIS CATHETER  Surgeon: Dr. Deitra Mayo  Date of Procedure: 08/07/20 @ 09:35  Cautery will be used.  Position during surgery:    Please send documentation back to:  [facility] (Fax # [phone])   Kayleen Memos, RN  08/03/2020 5:34 PM     Device Information:   Clinic EP Physician:   {Press F2 for EP Physician:[id number]} Device Type:  {Press F2 and Choose 1 (or Pharmacist, community and Phone #:  {Press F2 to select Rep QWQV:[id number]} Pacemaker Dependent?:  {yes/no:20286} Date of Last Device Check:  ***        Normal Device Function?:  {yes/no:20286}     Electrophysiologist's Recommendations:    Have magnet available.  Provide continuous ECG monitoring when magnet is used or reprogramming is to be performed.   {Press F2, choose VQQ:[id number]}  Per Device Clinic Standing Orders, Drake Leach  08/04/2020 2:45 PM

## 2020-08-04 NOTE — Progress Notes (Signed)
Duplicate

## 2020-08-04 NOTE — Progress Notes (Signed)
I called the cell number listed for Cristobal Kruplijanin,: using [employer] ID # [id number]; the person who answered the  phone said that he was [facility] and then said talk to my brother. Velcro [place]. Velcro reports that patient  Has no chest pain or short ness of breath. Velcro said that [place] checks CBG maybe 2 times a week, Velcro could not tell me any readings.  Velcro seem to become aggravated and speak fast, the interpreter stayed quite.  Velcro stated that he has been Zaeden's care giver since 2010, "I already know all of this."  I gave instructions on medications, "I know, I know".Velcrocontinued to say he knew everything. Velcro stated that he has done this so many times , and he does not know why he has to answer the same questions over and over, and I already know everything." I informed Velcro that Antoni needs to have written down the last time patient had each medication, "I will be with him to answer questions."

## 2020-08-05 DIAGNOSIS — Z992 Dependence on renal dialysis: Secondary | ICD-10-CM | POA: Diagnosis not present

## 2020-08-05 DIAGNOSIS — D509 Iron deficiency anemia, unspecified: Secondary | ICD-10-CM | POA: Diagnosis not present

## 2020-08-05 DIAGNOSIS — N186 End stage renal disease: Secondary | ICD-10-CM | POA: Diagnosis not present

## 2020-08-05 DIAGNOSIS — E1122 Type 2 diabetes mellitus with diabetic chronic kidney disease: Secondary | ICD-10-CM | POA: Diagnosis not present

## 2020-08-05 DIAGNOSIS — D631 Anemia in chronic kidney disease: Secondary | ICD-10-CM | POA: Diagnosis not present

## 2020-08-05 DIAGNOSIS — N2581 Secondary hyperparathyroidism of renal origin: Secondary | ICD-10-CM | POA: Diagnosis not present

## 2020-08-07 ENCOUNTER — Encounter (HOSPITAL_COMMUNITY): Payer: Self-pay | Admitting: Vascular Surgery

## 2020-08-07 ENCOUNTER — Ambulatory Visit (HOSPITAL_COMMUNITY): Payer: Medicare Other | Admitting: Anesthesiology

## 2020-08-07 ENCOUNTER — Ambulatory Visit (HOSPITAL_COMMUNITY)
Admission: RE | Admit: 2020-08-07 | Discharge: 2020-08-07 | Disposition: A | Discharge status: Home / Self Care | Payer: Medicare Other | Attending: Vascular Surgery | Admitting: Vascular Surgery

## 2020-08-07 ENCOUNTER — Ambulatory Visit (HOSPITAL_COMMUNITY): Payer: Medicare Other

## 2020-08-07 ENCOUNTER — Encounter (HOSPITAL_COMMUNITY)
Admission: RE | Disposition: A | Discharge status: Home / Self Care | Payer: Self-pay | Source: Home / Self Care | Attending: Vascular Surgery

## 2020-08-07 ENCOUNTER — Other Ambulatory Visit: Payer: Self-pay

## 2020-08-07 DIAGNOSIS — N186 End stage renal disease: Secondary | ICD-10-CM | POA: Diagnosis not present

## 2020-08-07 DIAGNOSIS — Z992 Dependence on renal dialysis: Secondary | ICD-10-CM

## 2020-08-07 DIAGNOSIS — I517 Cardiomegaly: Secondary | ICD-10-CM | POA: Diagnosis not present

## 2020-08-07 DIAGNOSIS — Z452 Encounter for adjustment and management of vascular access device: Secondary | ICD-10-CM | POA: Diagnosis not present

## 2020-08-07 DIAGNOSIS — Z95828 Presence of other vascular implants and grafts: Secondary | ICD-10-CM

## 2020-08-07 DIAGNOSIS — E1122 Type 2 diabetes mellitus with diabetic chronic kidney disease: Secondary | ICD-10-CM | POA: Diagnosis not present

## 2020-08-07 DIAGNOSIS — T82898A Other specified complication of vascular prosthetic devices, implants and grafts, initial encounter: Secondary | ICD-10-CM | POA: Diagnosis not present

## 2020-08-07 DIAGNOSIS — Z95810 Presence of automatic (implantable) cardiac defibrillator: Secondary | ICD-10-CM | POA: Diagnosis not present

## 2020-08-07 DIAGNOSIS — I5022 Chronic systolic (congestive) heart failure: Secondary | ICD-10-CM | POA: Diagnosis not present

## 2020-08-07 DIAGNOSIS — I132 Hypertensive heart and chronic kidney disease with heart failure and with stage 5 chronic kidney disease, or end stage renal disease: Secondary | ICD-10-CM | POA: Diagnosis not present

## 2020-08-07 HISTORY — DX: Disorientation, unspecified: R41.0

## 2020-08-07 HISTORY — PX: REVISON OF ARTERIOVENOUS FISTULA: SHX6074

## 2020-08-07 HISTORY — PX: INSERTION OF DIALYSIS CATHETER: SHX1324

## 2020-08-07 LAB — POCT I-STAT, CHEM 8
BUN: 74 mg/dL — ABNORMAL HIGH (ref 6–20)
Calcium, Ion: 1.17 mmol/L (ref 1.15–1.40)
Chloride: 102 mmol/L (ref 98–111)
Creatinine, Ser: 9.2 mg/dL — ABNORMAL HIGH (ref 0.61–1.24)
Glucose, Bld: 88 mg/dL (ref 70–99)
HCT: 34 % — ABNORMAL LOW (ref 39.0–52.0)
Hemoglobin: 11.6 g/dL — ABNORMAL LOW (ref 13.0–17.0)
Potassium: 5 mmol/L (ref 3.5–5.1)
Sodium: 139 mmol/L (ref 135–145)
TCO2: 26 mmol/L (ref 22–32)

## 2020-08-07 LAB — GLUCOSE, CAPILLARY
Glucose-Capillary: 84 mg/dL (ref 70–99)
Glucose-Capillary: 89 mg/dL (ref 70–99)
Glucose-Capillary: 83 mg/dL (ref 70–99)

## 2020-08-07 SURGERY — INSERTION OF DIALYSIS CATHETER
Anesthesia: Monitor Anesthesia Care | Site: Neck | Laterality: Right

## 2020-08-07 MED ORDER — OXYCODONE HCL 5 MG PO TABS
5.0000 mg | ORAL_TABLET | Freq: Once | ORAL | Status: AC | PRN
  Administered 2020-08-07: 5 mg via ORAL

## 2020-08-07 MED ORDER — HEPARIN SODIUM (PORCINE) 1000 UNIT/ML IJ SOLN
INTRAMUSCULAR | Status: DC | PRN
  Administered 2020-08-07: 1800 [IU]

## 2020-08-07 MED ORDER — CHLORHEXIDINE GLUCONATE 4 % EX LIQD: 60.0000 mL | Freq: Once | CUTANEOUS | Status: DC

## 2020-08-07 MED ORDER — LIDOCAINE-EPINEPHRINE (PF) 1 %-1:200000 IJ SOLN
INTRAMUSCULAR | Status: DC | PRN
  Administered 2020-08-07: 22 mL

## 2020-08-07 MED ORDER — ORAL CARE MOUTH RINSE: 15.0000 mL | Freq: Once | OROMUCOSAL | Status: AC

## 2020-08-07 MED ORDER — METOPROLOL SUCCINATE ER 50 MG PO TB24
50.0000 mg | ORAL_TABLET | Freq: Every day | ORAL | Status: DC
  Administered 2020-08-07: 50 mg via ORAL
  Filled 2020-08-07: qty 1

## 2020-08-07 MED ORDER — OXYCODONE HCL 5 MG PO TABS
ORAL_TABLET | ORAL | Status: AC
  Filled 2020-08-07: qty 1

## 2020-08-07 MED ORDER — FENTANYL CITRATE (PF) 100 MCG/2ML IJ SOLN: 25.0000 ug | INTRAMUSCULAR | Status: DC | PRN

## 2020-08-07 MED ORDER — OXYCODONE-ACETAMINOPHEN 5-325 MG PO TABS: 1.0000 | ORAL_TABLET | ORAL | 0 refills | Status: AC | PRN

## 2020-08-07 MED ORDER — PROPOFOL 10 MG/ML IV BOLUS
INTRAVENOUS | Status: AC
  Filled 2020-08-07: qty 20

## 2020-08-07 MED ORDER — ACETAMINOPHEN 500 MG PO TABS: 1000.0000 mg | ORAL_TABLET | Freq: Once | ORAL | Status: DC | PRN

## 2020-08-07 MED ORDER — CHLORHEXIDINE GLUCONATE 0.12 % MT SOLN
OROMUCOSAL | Status: AC
  Administered 2020-08-07: 15 mL via OROMUCOSAL
  Filled 2020-08-07: qty 15

## 2020-08-07 MED ORDER — ONDANSETRON HCL 4 MG/2ML IJ SOLN
INTRAMUSCULAR | Status: AC
  Filled 2020-08-07: qty 2

## 2020-08-07 MED ORDER — CHLORHEXIDINE GLUCONATE 0.12 % MT SOLN: 15.0000 mL | Freq: Once | OROMUCOSAL | Status: AC

## 2020-08-07 MED ORDER — 0.9 % SODIUM CHLORIDE (POUR BTL) OPTIME
TOPICAL | Status: DC | PRN
  Administered 2020-08-07: 1000 mL

## 2020-08-07 MED ORDER — SODIUM CHLORIDE 0.9 % IV SOLN: INTRAVENOUS | Status: DC

## 2020-08-07 MED ORDER — PROTAMINE SULFATE 10 MG/ML IV SOLN
INTRAVENOUS | Status: DC | PRN
  Administered 2020-08-07: 30 mg via INTRAVENOUS
  Administered 2020-08-07 (×2): 10 mg via INTRAVENOUS

## 2020-08-07 MED ORDER — OXYCODONE HCL 5 MG/5ML PO SOLN: 5.0000 mg | Freq: Once | ORAL | Status: AC | PRN

## 2020-08-07 MED ORDER — HEPARIN SODIUM (PORCINE) 1000 UNIT/ML IJ SOLN
INTRAMUSCULAR | Status: DC | PRN
  Administered 2020-08-07: 6000 [IU] via INTRAVENOUS

## 2020-08-07 MED ORDER — METOPROLOL SUCCINATE ER 25 MG PO TB24
ORAL_TABLET | ORAL | Status: AC
  Filled 2020-08-07: qty 2

## 2020-08-07 MED ORDER — ACETAMINOPHEN 10 MG/ML IV SOLN: 1000.0000 mg | Freq: Once | INTRAVENOUS | Status: DC | PRN

## 2020-08-07 MED ORDER — CEFAZOLIN SODIUM-DEXTROSE 2-4 GM/100ML-% IV SOLN
2.0000 g | INTRAVENOUS | Status: AC
  Administered 2020-08-07: 2 g via INTRAVENOUS
  Filled 2020-08-07: qty 100

## 2020-08-07 MED ORDER — ACETAMINOPHEN 160 MG/5ML PO SOLN: 1000.0000 mg | Freq: Once | ORAL | Status: DC | PRN

## 2020-08-07 MED ORDER — OXYMETAZOLINE HCL 0.05 % NA SOLN
NASAL | Status: AC
  Filled 2020-08-07: qty 30

## 2020-08-07 MED ORDER — PHENYLEPHRINE HCL-NACL 10-0.9 MG/250ML-% IV SOLN
INTRAVENOUS | Status: DC | PRN
  Administered 2020-08-07: 25 ug/min via INTRAVENOUS

## 2020-08-07 MED ORDER — METOPROLOL TARTRATE 50 MG PO TABS
ORAL_TABLET | ORAL | Status: AC
  Filled 2020-08-07: qty 1

## 2020-08-07 MED ORDER — PROPOFOL 10 MG/ML IV BOLUS
INTRAVENOUS | Status: DC | PRN
  Administered 2020-08-07: 30 mg via INTRAVENOUS
  Administered 2020-08-07 (×3): 20 mg via INTRAVENOUS
  Administered 2020-08-07: 30 mg via INTRAVENOUS

## 2020-08-07 MED ORDER — PROPOFOL 500 MG/50ML IV EMUL
INTRAVENOUS | Status: DC | PRN
  Administered 2020-08-07: 75 ug/kg/min via INTRAVENOUS

## 2020-08-07 MED ORDER — FENTANYL CITRATE (PF) 250 MCG/5ML IJ SOLN
INTRAMUSCULAR | Status: AC
  Filled 2020-08-07: qty 5

## 2020-08-07 MED ORDER — OXYMETAZOLINE HCL 0.05 % NA SOLN
NASAL | Status: DC | PRN
  Administered 2020-08-07: 4 via NASAL

## 2020-08-07 MED ORDER — SODIUM CHLORIDE 0.9 % IV SOLN
INTRAVENOUS | Status: DC | PRN
  Administered 2020-08-07: 11:00:00 500 mL

## 2020-08-07 SURGICAL SUPPLY — 50 items
ARMBAND PINK RESTRICT EXTREMIT (MISCELLANEOUS) ×3 IMPLANT
BAG DECANTER FOR FLEXI CONT (MISCELLANEOUS) ×3 IMPLANT
BIOPATCH RED 1 DISK 7.0 (GAUZE/BANDAGES/DRESSINGS) ×3 IMPLANT
CANISTER SUCT 3000ML PPV (MISCELLANEOUS) ×3 IMPLANT
CANNULA VESSEL 3MM 2 BLNT TIP (CANNULA) ×3 IMPLANT
CATH PALINDROME-P 19CM W/VT (CATHETERS) ×3 IMPLANT
CATH PALINDROME-P 23CM W/VT (CATHETERS) IMPLANT
CATH PALINDROME-P 28CM W/VT (CATHETERS) IMPLANT
CHLORAPREP W/TINT 26 (MISCELLANEOUS) ×3 IMPLANT
CLIP VESOCCLUDE MED 6/CT (CLIP) ×3 IMPLANT
CLIP VESOCCLUDE SM WIDE 6/CT (CLIP) ×3 IMPLANT
COVER PROBE W GEL 5X96 (DRAPES) ×3 IMPLANT
COVER SURGICAL LIGHT HANDLE (MISCELLANEOUS) ×3 IMPLANT
COVER WAND RF STERILE (DRAPES) IMPLANT
DERMABOND ADVANCED (GAUZE/BANDAGES/DRESSINGS) ×2
DERMABOND ADVANCED .7 DNX12 (GAUZE/BANDAGES/DRESSINGS) ×4 IMPLANT
DRAPE C-ARM 42X72 X-RAY (DRAPES) ×3 IMPLANT
DRAPE CHEST BREAST 15X10 FENES (DRAPES) ×3 IMPLANT
ELECT REM PT RETURN 9FT ADLT (ELECTROSURGICAL) ×3
ELECTRODE REM PT RTRN 9FT ADLT (ELECTROSURGICAL) ×2 IMPLANT
GAUZE 4X4 16PLY RFD (DISPOSABLE) ×3 IMPLANT
GLOVE BIO SURGEON STRL SZ7.5 (GLOVE) ×3 IMPLANT
GLOVE BIOGEL PI IND STRL 8 (GLOVE) ×2 IMPLANT
GLOVE BIOGEL PI INDICATOR 8 (GLOVE) ×1
GOWN STRL REUS W/ TWL LRG LVL3 (GOWN DISPOSABLE) ×6 IMPLANT
GOWN STRL REUS W/TWL LRG LVL3 (GOWN DISPOSABLE) ×3
KIT BASIN OR (CUSTOM PROCEDURE TRAY) ×3 IMPLANT
KIT TURNOVER KIT B (KITS) ×3 IMPLANT
NEEDLE 18GX1X1/2 (RX/OR ONLY) (NEEDLE) ×3 IMPLANT
NEEDLE HYPO 25GX1X1/2 BEV (NEEDLE) ×3 IMPLANT
NS IRRIG 1000ML POUR BTL (IV SOLUTION) ×3 IMPLANT
PACK CV ACCESS (CUSTOM PROCEDURE TRAY) ×3 IMPLANT
PAD ARMBOARD 7.5X6 YLW CONV (MISCELLANEOUS) ×6 IMPLANT
SET MICROPUNCTURE 5F STIFF (MISCELLANEOUS) ×3 IMPLANT
SPONGE SURGIFOAM ABS GEL 100 (HEMOSTASIS) IMPLANT
SUT ETHILON 3 0 PS 1 (SUTURE) ×3 IMPLANT
SUT PROLENE 5 0 C 1 24 (SUTURE) ×3 IMPLANT
SUT PROLENE 6 0 BV (SUTURE) ×6 IMPLANT
SUT VIC AB 3-0 SH 27 (SUTURE) ×1
SUT VIC AB 3-0 SH 27X BRD (SUTURE) ×2 IMPLANT
SUT VIC AB 4-0 PS2 18 (SUTURE) ×3 IMPLANT
SUT VICRYL 4-0 PS2 18IN ABS (SUTURE) ×3 IMPLANT
SYR 10ML LL (SYRINGE) ×3 IMPLANT
SYR 20ML LL LF (SYRINGE) ×3 IMPLANT
SYR 5ML LL (SYRINGE) ×3 IMPLANT
SYR CONTROL 10ML LL (SYRINGE) ×3 IMPLANT
TOWEL GREEN STERILE (TOWEL DISPOSABLE) ×3 IMPLANT
TOWEL GREEN STERILE FF (TOWEL DISPOSABLE) ×3 IMPLANT
UNDERPAD 30X36 HEAVY ABSORB (UNDERPADS AND DIAPERS) ×3 IMPLANT
WATER STERILE IRR 1000ML POUR (IV SOLUTION) ×3 IMPLANT

## 2020-08-07 NOTE — Progress Notes (Signed)
Arrived PACu with gauze pads over both nares and minimal drainage/brb from right. Drip pad changed.

## 2020-08-07 NOTE — Anesthesia Procedure Notes (Addendum)
Procedure Name: MAC Date/Time: 08/07/2020 10:05 AM Performed by: Rande Brunt, CRNA Pre-anesthesia Checklist: Patient identified, Emergency Drugs available, Suction available, Patient being monitored and Timeout performed Patient Re-evaluated:Patient Re-evaluated prior to induction Induction Type: IV induction Ventilation: Nasal airway inserted- appropriate to patient size Placement Confirmation: positive ETCO2 and breath sounds checked- equal and bilateral Dental Injury: Teeth and Oropharynx as per pre-operative assessment

## 2020-08-07 NOTE — Anesthesia Preprocedure Evaluation (Addendum)
Anesthesia Evaluation  Patient identified by MRN, date of birth, ID band Patient awake    Reviewed: Allergy & Precautions, NPO status , Patient's Chart, lab work & pertinent test results  History of Anesthesia Complications Negative for: history of anesthetic complications  Airway Mallampati: I  TM Distance: >3 FB Neck ROM: Full    Dental  (+) Dental Advisory Given, Teeth Intact,    Pulmonary neg shortness of breath, neg sleep apnea, neg COPD, neg recent URI, former smoker,  Covid-19 Nucleic Acid Test Results Lab Results      Component                Value               Date                      [place]              NEGATIVE            08/04/2020                [place]              NEGATIVE            06/16/2020                [place]              NEGATIVE            05/05/2020                [place]              NEGATIVE            03/15/2020                [place]              NEGATIVE            01/07/2020              breath sounds clear to auscultation       Cardiovascular hypertension, Pt. on medications and Pt. on home beta blockers +CHF  + Cardiac Defibrillator  Rhythm:Regular  1. Severe biventricular failure.  2. Left ventricular ejection fraction, by estimation, is 30 to 35%. The  left ventricle has moderately decreased function. The left ventricle has  no regional wall motion abnormalities. The left ventricular internal  cavity size was moderately dilated.  Left ventricular diastolic parameters are consistent with Grade II  diastolic dysfunction (pseudonormalization). Elevated left atrial  pressure.  3. Right ventricular systolic function is severely reduced. The right  ventricular size is severely enlarged. There is mildly elevated pulmonary  artery systolic pressure. The estimated right ventricular systolic  pressure is 29.9 mmHg.  4. Left atrial size was moderately dilated.  5. Right  atrial size was moderately dilated.  6. The mitral valve is normal in structure. Mild mitral valve  regurgitation. No evidence of mitral stenosis.  7. The aortic valve is normal in structure. Aortic valve regurgitation is  not visualized. No aortic stenosis is present.  8. The inferior vena cava is normal in size with greater than 50%  respiratory variability, suggesting right atrial pressure of 3 mmHg.    Neuro/Psych PSYCHIATRIC DISORDERS Depression Schizophrenia    GI/Hepatic Neg liver ROS, GERD  Controlled,  Endo/Other  diabetes  Renal/GU ESRF and DialysisRenal diseaseLab Results  Component                Value               Date                      CREATININE               9.20 (H)            08/07/2020           Lab Results      Component                Value               Date                      K                        5.0                 08/07/2020                Musculoskeletal   Abdominal   Peds  Hematology  (+) Blood dyscrasia, anemia , Lab Results      Component                Value               Date                      WBC                      9.1                 07/07/2020                HGB                      11.6 (L)            08/07/2020                HCT                      34.0 (L)            08/07/2020                MCV                      93                  07/07/2020                PLT                      202                 07/07/2020              Anesthesia Other Findings   Reproductive/Obstetrics                            Anesthesia Physical Anesthesia Plan  ASA: IV  Anesthesia Plan: MAC   Post-op Pain Management:  Induction: Intravenous  PONV Risk Score and Plan: 1 and Treatment may vary due to age or medical condition and Propofol infusion  Airway Management Planned: Nasal Cannula  Additional Equipment: None  Intra-op Plan:   Post-operative Plan:   Informed Consent: I have reviewed  the patients History and Physical, chart, labs and discussed the procedure including the risks, benefits and alternatives for the proposed anesthesia with the patient or authorized representative who has indicated his/her understanding and acceptance.     Dental advisory given  Plan Discussed with: CRNA and Surgeon  Anesthesia Plan Comments:         Anesthesia Quick Evaluation

## 2020-08-07 NOTE — H&P (Signed)
Patient name: James Nicholson MRN: [id number] DOB: [birth year] Sex: male  REASON FOR VISIT:   For placement of a tunneled dialysis catheter and plication of an aneurysm in his right radiocephalic fistula  HPI:   James Nicholson is a pleasant 54 y.o. male who I last saw on 06/14/2020.  I originally had seen him in July of this year with aneurysms of his right radiocephalic fistula.  He underwent a fistulogram in May which showed a widely patent radiocephalic fistula with no central venous stenosis.  The upper arm cephalic vein was occluded in the forearm fistula emptied into the basilic system.  There was no outflow stenosis identified.  On 9/35/7017 he underwent plication of one of the 2 large aneurysms of his AV fistula.  The more central aneurysm was addressed first.  The fistula centrally was somewhat tortuous and then had a hard time cannulating the fistula.  There was sticking the larger aneurysm which is the one we now plan on repairing.  He dialyzes on Tuesdays Thursdays and Saturdays.  Given that they would not be able to stick the fistula right away I elected also to place a catheter.  Current Facility-Administered Medications  Medication Dose Route Frequency Provider Last Rate Last Admin  . 0.9 %  sodium chloride infusion   Intravenous Continuous Angelia Mould, MD      . ceFAZolin (ANCEF) IVPB 2g/100 mL premix  2 g Intravenous 30 min Pre-Op Angelia Mould, MD      . chlorhexidine (HIBICLENS) 4 % liquid 4 application  60 mL Topical Once Angelia Mould, MD       And  . Derrill Memo ON 08/08/2020] chlorhexidine (HIBICLENS) 4 % liquid 4 application  60 mL Topical Once Angelia Mould, MD      . metoprolol succinate (TOPROL-XL) 24 hr tablet 50 mg  50 mg Oral Daily Oleta Mouse, MD   50 mg at 08/07/20 0814  . metoprolol succinate (TOPROL-XL) 25 MG 24 hr tablet             REVIEW OF SYSTEMS:  [X]  denotes positive finding, [ ]  denotes negative  finding Vascular    Leg swelling    Cardiac    Chest pain or chest pressure:    Shortness of breath upon exertion:    Short of breath when lying flat:    Irregular heart rhythm:    Constitutional    Fever or chills:     PHYSICAL EXAM:   Vitals:   08/07/20 0742  BP: (!) 169/92  Pulse: 73  Resp: 20  Temp: 97.8 F (36.6 C)  TempSrc: Oral  SpO2: 100%  Weight: 72 kg  Height: 5\' 7"  (1.702 m)    GENERAL: The patient is a well-nourished male, in no acute distress. The vital signs are documented above. CARDIOVASCULAR: There is a regular rate and rhythm. PULMONARY: There is good air exchange bilaterally without wheezing or rales. VASCULAR: His fistula is patent.  DATA:   His potassium is 5.0.  MEDICAL ISSUES:   END-STAGE RENAL DISEASE: This patient presents for plication of the second aneurysm of his right forearm AV fistula.  This is where they have been cannulating the fistula.  Therefore they will not be able to use the fistula for 4 to 6 weeks as they are having a harder time cannulating it centrally.  For this reason I recommended placement of a tunneled dialysis catheter.  He has an AICD on the left so we will have to  place this on the right side.  I explained that there is a small chance if we could get in on the right would have to place a femoral catheter.  Deitra Mayo [facility] [place] [phone]

## 2020-08-07 NOTE — Transfer of Care (Signed)
Immediate Anesthesia Transfer of Care Note  Patient: James Nicholson  Procedure(s) Performed: INSERTION OF DIALYSIS CATHETER (Right Neck) PLICATION OF ARTERIOVENOUS FISTULA RIGHT (Right Arm Lower)  Patient Location: PACU  Anesthesia Type:MAC  Level of Consciousness: awake, alert  and oriented  Airway & Oxygen Therapy: Patient Spontanous Breathing and Patient connected to face mask oxygen  Post-op Assessment: Report given to RN, Post -op Vital signs reviewed and stable and Patient moving all extremities  Post vital signs: Reviewed and stable  Last Vitals:  Vitals Value Taken Time  BP 109/94 08/07/20 1155  Temp    Pulse 72 08/07/20 1156  Resp 29 08/07/20 1156  SpO2 96 % 08/07/20 1156  Vitals shown include unvalidated device data.  Last Pain:  Vitals:   08/07/20 0742  TempSrc: Oral  PainSc:          Complications: No complications documented.

## 2020-08-07 NOTE — Op Note (Signed)
    NAME: James Nicholson    MRN: [id number] DOB: Mar 01, 1966    DATE OF OPERATION: 08/07/2020  PREOP DIAGNOSIS:    Aneurysmal right forearm AV fistula  POSTOP DIAGNOSIS:    Same  PROCEDURE:    1.  Ultrasound-guided placement of right IJ tunneled dialysis catheter 2.  Plication of right radiocephalic fistula  SURGEON: Judeth Cornfield. Scot Dock, MD  ASSIST: Karoline Caldwell, PA  ANESTHESIA: Local with sedation  EBL: Minimal  INDICATIONS:    James Nicholson is a 54 y.o. male who had an aneurysmal right forearm fistula.  He had to especially large aneurysms and one is already been addressed.  He comes in to have the other one addressed.  I felt that he would need a catheter until the new area could be cannulated.  FINDINGS:   Good thrill at the completion of the procedure.  TECHNIQUE:   The patient was taken to the operating room and sedated by anesthesia.  The right IJ was patent by ultrasound.  On the left side he had an AICD.  The right side of the neck and chest were prepped and draped in usual sterile fashion.  Under ultrasound guidance, after the skin was anesthetized, I cannulated the right IJ with a micropuncture needle and a micropuncture sheath was introduced over a wire.  I then advanced the J-wire into the right atrium.  The exit site for the catheter was selected and the skin anesthetized between the 2 areas.  A 19 cm catheter was tunneled between the 2 incisions.  The tract over the wire was dilated the dilator and peel-away sheath were advanced over the wire.  The wire and dilator were removed.  The catheter was advanced through the peel-away sheath and positioned in the right atrium.  Position was confirmed under fluoroscopy.  Both ports withdrew easily with and flushed with heparinized saline and filled with concentrated heparin.  The catheter was secured at its exit site with a 3-0 nylon suture.  The IJ cannulation site was closed with a 4-0 subcuticular stitch.  Sterile  dressing was applied.  Attention was then turned to the right arm.  The right arm was prepped and draped in usual sterile fashion.  An elliptical incision was marked encompassing this large aneurysm.  The skin was anesthetized.  The aneurysm was dissected free circumferentially to more normal-appearing fistula proximal and distal.  The patient was heparinized.  The fistula was clamped proximally and distally.  I then excised a large ellipse of the fistula along the lateral posterior wall.  This was then closed with running 5-0 Prolene suture.  I then used several tacking sutures with 6 oh to slightly rotate the fistula so that when the fistula is cannulated it can be cannulated anteriorly without compromising the suture line.  Hemostasis was obtained in the wound.  The wound was then closed with a running 3-0 Vicryl.  The skin was closed with 4-0 Vicryl.  Dermabond was applied.  The patient tolerated the procedure well was transferred to recovery room in stable condition.  All needle and sponge counts were correct.  Given the complexity of the case a first assistant was necessary in order to expedient the procedure and safely perform the technical aspects of the operation.  Deitra Mayo, MD, FACS [facility]  DATE OF DICTATION:   08/07/2020

## 2020-08-07 NOTE — Discharge Instructions (Addendum)
[facility]  Discharge Instructions  AV Fistula or Graft Surgery for Dialysis Access  Please refer to the following instructions for your post-procedure care. Your surgeon or physician assistant will discuss any changes with you.  Activity  You may drive the day following your surgery, if you are comfortable and no longer taking prescription pain medication. Resume full activity as the soreness in your incision resolves.  Bathing/Showering  You may shower after you go home. Keep your incision dry for 48 hours. Do not soak in a bathtub, hot tub, or swim until the incision heals completely. You may not shower if you have a hemodialysis catheter.  Incision Care  Clean your incision with mild soap and water after 48 hours. Pat the area dry with a clean towel. You do not need a bandage unless otherwise instructed. Do not apply any ointments or creams to your incision. You may have skin glue on your incision. Do not peel it off. It will come off on its own in about one week. Your arm may swell a bit after surgery. To reduce swelling use pillows to elevate your arm so it is above your heart. Your doctor will tell you if you need to lightly wrap your arm with an ACE bandage.  Diet  Resume your normal diet. There are not special food restrictions following this procedure. In order to heal from your surgery, it is CRITICAL to get adequate nutrition. Your body requires vitamins, minerals, and protein. Vegetables are the best source of vitamins and minerals. Vegetables also provide the perfect balance of protein. Processed food has little nutritional value, so try to avoid this.  Medications  Resume taking all of your medications. If your incision is causing pain, you may take over-the counter pain relievers such as acetaminophen (Tylenol). If you were prescribed a stronger pain medication, please be aware these medications can cause nausea and constipation. Prevent  nausea by taking the medication with a snack or meal. Avoid constipation by drinking plenty of fluids and eating foods with high amount of fiber, such as fruits, vegetables, and grains.  Do not take Tylenol if you are taking prescription pain medications.  Follow up Your surgeon may want to see you in the office following your access surgery. If so, this will be arranged at the time of your surgery.  Please call us immediately for any of the following conditions:  . Increased pain, redness, drainage (pus) from your incision site . Fever of 101 degrees or higher . Severe or worsening pain at your incision site . Hand pain or numbness. .  Reduce your risk of vascular disease:  . Stop smoking. If you would like help, call [facility] at 1-800-QUIT-NOW (831)407-4369) or [place] at 7084818474  . Manage your cholesterol . Maintain a desired weight . Control your diabetes . Keep your blood pressure down  Dialysis  It will take several weeks to several months for your new dialysis access to be ready for use. Your surgeon will determine when it is okay to use it. Your nephrologist will continue to direct your dialysis. You can continue to use your Permcath until your new access is ready for use.   08/07/2020 James Nicholson [id number] [birth year]  Surgeon(s): Angelia Mould, MD  Procedure(s): INSERTION OF DIALYSIS CATHETER PLICATION OF ARTERIOVENOUS FISTULA RIGHT   May stick graft immediately   May stick fistula in designated area only  X Do not stick AV fistula in surgical area for 6  weeks    If you have any questions, please call the office at [phone].

## 2020-08-08 ENCOUNTER — Encounter (HOSPITAL_COMMUNITY): Payer: Self-pay | Admitting: Vascular Surgery

## 2020-08-08 DIAGNOSIS — D631 Anemia in chronic kidney disease: Secondary | ICD-10-CM | POA: Diagnosis not present

## 2020-08-08 DIAGNOSIS — N2581 Secondary hyperparathyroidism of renal origin: Secondary | ICD-10-CM | POA: Diagnosis not present

## 2020-08-08 DIAGNOSIS — Z992 Dependence on renal dialysis: Secondary | ICD-10-CM | POA: Diagnosis not present

## 2020-08-08 DIAGNOSIS — N186 End stage renal disease: Secondary | ICD-10-CM | POA: Diagnosis not present

## 2020-08-08 DIAGNOSIS — E1122 Type 2 diabetes mellitus with diabetic chronic kidney disease: Secondary | ICD-10-CM | POA: Diagnosis not present

## 2020-08-08 DIAGNOSIS — D509 Iron deficiency anemia, unspecified: Secondary | ICD-10-CM | POA: Diagnosis not present

## 2020-08-08 NOTE — Anesthesia Postprocedure Evaluation (Signed)
Anesthesia Post Note  Patient: Ruven Martucci  Procedure(s) Performed: INSERTION OF DIALYSIS CATHETER (Right Neck) PLICATION OF ARTERIOVENOUS FISTULA RIGHT (Right Arm Lower)     Patient location during evaluation: PACU Anesthesia Type: MAC Level of consciousness: awake and alert Pain management: pain level controlled Vital Signs Assessment: post-procedure vital signs reviewed and stable Respiratory status: spontaneous breathing, nonlabored ventilation, respiratory function stable and patient connected to nasal cannula oxygen Cardiovascular status: blood pressure returned to baseline and stable Postop Assessment: no apparent nausea or vomiting Anesthetic complications: no   No complications documented.  Last Vitals:  Vitals:   08/07/20 1230 08/07/20 1255  BP: 133/80 136/81  Pulse:    Resp: 19 19  Temp:  (!) 36.1 C  SpO2: 97% 100%    Last Pain:  Vitals:   08/07/20 1255  TempSrc:   PainSc: 5                  Davelle Anselmi

## 2020-08-10 DIAGNOSIS — D509 Iron deficiency anemia, unspecified: Secondary | ICD-10-CM | POA: Diagnosis not present

## 2020-08-10 DIAGNOSIS — N2581 Secondary hyperparathyroidism of renal origin: Secondary | ICD-10-CM | POA: Diagnosis not present

## 2020-08-10 DIAGNOSIS — D631 Anemia in chronic kidney disease: Secondary | ICD-10-CM | POA: Diagnosis not present

## 2020-08-10 DIAGNOSIS — Z992 Dependence on renal dialysis: Secondary | ICD-10-CM | POA: Diagnosis not present

## 2020-08-10 DIAGNOSIS — E1122 Type 2 diabetes mellitus with diabetic chronic kidney disease: Secondary | ICD-10-CM | POA: Diagnosis not present

## 2020-08-10 DIAGNOSIS — N186 End stage renal disease: Secondary | ICD-10-CM | POA: Diagnosis not present

## 2020-08-12 DIAGNOSIS — D631 Anemia in chronic kidney disease: Secondary | ICD-10-CM | POA: Diagnosis not present

## 2020-08-12 DIAGNOSIS — D509 Iron deficiency anemia, unspecified: Secondary | ICD-10-CM | POA: Diagnosis not present

## 2020-08-12 DIAGNOSIS — Z992 Dependence on renal dialysis: Secondary | ICD-10-CM | POA: Diagnosis not present

## 2020-08-12 DIAGNOSIS — N2581 Secondary hyperparathyroidism of renal origin: Secondary | ICD-10-CM | POA: Diagnosis not present

## 2020-08-12 DIAGNOSIS — N186 End stage renal disease: Secondary | ICD-10-CM | POA: Diagnosis not present

## 2020-08-12 DIAGNOSIS — E1122 Type 2 diabetes mellitus with diabetic chronic kidney disease: Secondary | ICD-10-CM | POA: Diagnosis not present

## 2020-08-15 DIAGNOSIS — D631 Anemia in chronic kidney disease: Secondary | ICD-10-CM | POA: Diagnosis not present

## 2020-08-15 DIAGNOSIS — N186 End stage renal disease: Secondary | ICD-10-CM | POA: Diagnosis not present

## 2020-08-15 DIAGNOSIS — D509 Iron deficiency anemia, unspecified: Secondary | ICD-10-CM | POA: Diagnosis not present

## 2020-08-15 DIAGNOSIS — Z992 Dependence on renal dialysis: Secondary | ICD-10-CM | POA: Diagnosis not present

## 2020-08-15 DIAGNOSIS — N2581 Secondary hyperparathyroidism of renal origin: Secondary | ICD-10-CM | POA: Diagnosis not present

## 2020-08-15 DIAGNOSIS — E1122 Type 2 diabetes mellitus with diabetic chronic kidney disease: Secondary | ICD-10-CM | POA: Diagnosis not present

## 2020-08-17 DIAGNOSIS — Z992 Dependence on renal dialysis: Secondary | ICD-10-CM | POA: Diagnosis not present

## 2020-08-17 DIAGNOSIS — N186 End stage renal disease: Secondary | ICD-10-CM | POA: Diagnosis not present

## 2020-08-17 DIAGNOSIS — N2581 Secondary hyperparathyroidism of renal origin: Secondary | ICD-10-CM | POA: Diagnosis not present

## 2020-08-17 DIAGNOSIS — D631 Anemia in chronic kidney disease: Secondary | ICD-10-CM | POA: Diagnosis not present

## 2020-08-17 DIAGNOSIS — D509 Iron deficiency anemia, unspecified: Secondary | ICD-10-CM | POA: Diagnosis not present

## 2020-08-17 DIAGNOSIS — E1122 Type 2 diabetes mellitus with diabetic chronic kidney disease: Secondary | ICD-10-CM | POA: Diagnosis not present

## 2020-08-19 DIAGNOSIS — N2581 Secondary hyperparathyroidism of renal origin: Secondary | ICD-10-CM | POA: Diagnosis not present

## 2020-08-19 DIAGNOSIS — E1122 Type 2 diabetes mellitus with diabetic chronic kidney disease: Secondary | ICD-10-CM | POA: Diagnosis not present

## 2020-08-19 DIAGNOSIS — Z992 Dependence on renal dialysis: Secondary | ICD-10-CM | POA: Diagnosis not present

## 2020-08-19 DIAGNOSIS — D631 Anemia in chronic kidney disease: Secondary | ICD-10-CM | POA: Diagnosis not present

## 2020-08-19 DIAGNOSIS — D509 Iron deficiency anemia, unspecified: Secondary | ICD-10-CM | POA: Diagnosis not present

## 2020-08-19 DIAGNOSIS — N186 End stage renal disease: Secondary | ICD-10-CM | POA: Diagnosis not present

## 2020-08-22 DIAGNOSIS — E1122 Type 2 diabetes mellitus with diabetic chronic kidney disease: Secondary | ICD-10-CM | POA: Diagnosis not present

## 2020-08-22 DIAGNOSIS — Z992 Dependence on renal dialysis: Secondary | ICD-10-CM | POA: Diagnosis not present

## 2020-08-22 DIAGNOSIS — D631 Anemia in chronic kidney disease: Secondary | ICD-10-CM | POA: Diagnosis not present

## 2020-08-22 DIAGNOSIS — N186 End stage renal disease: Secondary | ICD-10-CM | POA: Diagnosis not present

## 2020-08-22 DIAGNOSIS — N2581 Secondary hyperparathyroidism of renal origin: Secondary | ICD-10-CM | POA: Diagnosis not present

## 2020-08-22 DIAGNOSIS — D509 Iron deficiency anemia, unspecified: Secondary | ICD-10-CM | POA: Diagnosis not present

## 2020-08-24 DIAGNOSIS — D631 Anemia in chronic kidney disease: Secondary | ICD-10-CM | POA: Diagnosis not present

## 2020-08-24 DIAGNOSIS — N2581 Secondary hyperparathyroidism of renal origin: Secondary | ICD-10-CM | POA: Diagnosis not present

## 2020-08-24 DIAGNOSIS — D509 Iron deficiency anemia, unspecified: Secondary | ICD-10-CM | POA: Diagnosis not present

## 2020-08-24 DIAGNOSIS — Z992 Dependence on renal dialysis: Secondary | ICD-10-CM | POA: Diagnosis not present

## 2020-08-24 DIAGNOSIS — E1122 Type 2 diabetes mellitus with diabetic chronic kidney disease: Secondary | ICD-10-CM | POA: Diagnosis not present

## 2020-08-24 DIAGNOSIS — N186 End stage renal disease: Secondary | ICD-10-CM | POA: Diagnosis not present

## 2020-08-26 DIAGNOSIS — Z992 Dependence on renal dialysis: Secondary | ICD-10-CM | POA: Diagnosis not present

## 2020-08-26 DIAGNOSIS — D631 Anemia in chronic kidney disease: Secondary | ICD-10-CM | POA: Diagnosis not present

## 2020-08-26 DIAGNOSIS — D509 Iron deficiency anemia, unspecified: Secondary | ICD-10-CM | POA: Diagnosis not present

## 2020-08-26 DIAGNOSIS — E1122 Type 2 diabetes mellitus with diabetic chronic kidney disease: Secondary | ICD-10-CM | POA: Diagnosis not present

## 2020-08-26 DIAGNOSIS — N186 End stage renal disease: Secondary | ICD-10-CM | POA: Diagnosis not present

## 2020-08-26 DIAGNOSIS — N2581 Secondary hyperparathyroidism of renal origin: Secondary | ICD-10-CM | POA: Diagnosis not present

## 2020-08-28 DIAGNOSIS — N186 End stage renal disease: Secondary | ICD-10-CM | POA: Diagnosis not present

## 2020-08-28 DIAGNOSIS — N032 Chronic nephritic syndrome with diffuse membranous glomerulonephritis: Secondary | ICD-10-CM | POA: Diagnosis not present

## 2020-08-28 DIAGNOSIS — Z992 Dependence on renal dialysis: Secondary | ICD-10-CM | POA: Diagnosis not present

## 2020-08-29 DIAGNOSIS — N2581 Secondary hyperparathyroidism of renal origin: Secondary | ICD-10-CM | POA: Diagnosis not present

## 2020-08-29 DIAGNOSIS — Z992 Dependence on renal dialysis: Secondary | ICD-10-CM | POA: Diagnosis not present

## 2020-08-29 DIAGNOSIS — D509 Iron deficiency anemia, unspecified: Secondary | ICD-10-CM | POA: Diagnosis not present

## 2020-08-29 DIAGNOSIS — E1122 Type 2 diabetes mellitus with diabetic chronic kidney disease: Secondary | ICD-10-CM | POA: Diagnosis not present

## 2020-08-29 DIAGNOSIS — N186 End stage renal disease: Secondary | ICD-10-CM | POA: Diagnosis not present

## 2020-08-29 DIAGNOSIS — D631 Anemia in chronic kidney disease: Secondary | ICD-10-CM | POA: Diagnosis not present

## 2020-08-31 DIAGNOSIS — N186 End stage renal disease: Secondary | ICD-10-CM | POA: Diagnosis not present

## 2020-08-31 DIAGNOSIS — D509 Iron deficiency anemia, unspecified: Secondary | ICD-10-CM | POA: Diagnosis not present

## 2020-08-31 DIAGNOSIS — E1122 Type 2 diabetes mellitus with diabetic chronic kidney disease: Secondary | ICD-10-CM | POA: Diagnosis not present

## 2020-08-31 DIAGNOSIS — D631 Anemia in chronic kidney disease: Secondary | ICD-10-CM | POA: Diagnosis not present

## 2020-08-31 DIAGNOSIS — N2581 Secondary hyperparathyroidism of renal origin: Secondary | ICD-10-CM | POA: Diagnosis not present

## 2020-08-31 DIAGNOSIS — Z992 Dependence on renal dialysis: Secondary | ICD-10-CM | POA: Diagnosis not present

## 2020-09-02 DIAGNOSIS — Z992 Dependence on renal dialysis: Secondary | ICD-10-CM | POA: Diagnosis not present

## 2020-09-02 DIAGNOSIS — N186 End stage renal disease: Secondary | ICD-10-CM | POA: Diagnosis not present

## 2020-09-02 DIAGNOSIS — D509 Iron deficiency anemia, unspecified: Secondary | ICD-10-CM | POA: Diagnosis not present

## 2020-09-02 DIAGNOSIS — N2581 Secondary hyperparathyroidism of renal origin: Secondary | ICD-10-CM | POA: Diagnosis not present

## 2020-09-02 DIAGNOSIS — D631 Anemia in chronic kidney disease: Secondary | ICD-10-CM | POA: Diagnosis not present

## 2020-09-02 DIAGNOSIS — E1122 Type 2 diabetes mellitus with diabetic chronic kidney disease: Secondary | ICD-10-CM | POA: Diagnosis not present

## 2020-09-05 DIAGNOSIS — D509 Iron deficiency anemia, unspecified: Secondary | ICD-10-CM | POA: Diagnosis not present

## 2020-09-05 DIAGNOSIS — N186 End stage renal disease: Secondary | ICD-10-CM | POA: Diagnosis not present

## 2020-09-05 DIAGNOSIS — N2581 Secondary hyperparathyroidism of renal origin: Secondary | ICD-10-CM | POA: Diagnosis not present

## 2020-09-05 DIAGNOSIS — E1122 Type 2 diabetes mellitus with diabetic chronic kidney disease: Secondary | ICD-10-CM | POA: Diagnosis not present

## 2020-09-05 DIAGNOSIS — D631 Anemia in chronic kidney disease: Secondary | ICD-10-CM | POA: Diagnosis not present

## 2020-09-05 DIAGNOSIS — Z992 Dependence on renal dialysis: Secondary | ICD-10-CM | POA: Diagnosis not present

## 2020-09-06 ENCOUNTER — Ambulatory Visit (INDEPENDENT_AMBULATORY_CARE_PROVIDER_SITE_OTHER): Payer: Self-pay | Admitting: Physician Assistant

## 2020-09-06 ENCOUNTER — Other Ambulatory Visit: Payer: Self-pay

## 2020-09-06 VITALS — BP 160/91 | HR 81 | Temp 98.6°F | Resp 20 | Ht 67.0 in | Wt 156.9 lb

## 2020-09-06 DIAGNOSIS — Z992 Dependence on renal dialysis: Secondary | ICD-10-CM

## 2020-09-06 DIAGNOSIS — N186 End stage renal disease: Secondary | ICD-10-CM

## 2020-09-06 NOTE — Progress Notes (Signed)
    Postoperative Access Visit   History of Present Illness   James Nicholson is a 54 y.o. year old male who presents for postoperative follow-up for: plication of right radiocephalic fistula by Dr. Scot Dock (Date: 08/07/20).  The patient's wounds are healed.  The patient denies steal symptoms.  He is dialyzing from [facility] on a TThS schedule at the [address] location.  His brother is present today and is willing and able to translate.   Physical Examination   Vitals:   09/06/20 0833  BP: (!) 160/91  Pulse: 81  Resp: 20  Temp: 98.6 F (37 C)  TempSrc: Temporal  SpO2: 99%  Weight: 156 lb 14.4 oz (71.2 kg)  Height: 5\' 7"  (1.702 m)   Body mass index is 24.57 kg/m.  right arm Incision is healed, hand grip is 5/5, sensation in digits is intact, palpable thrill, bruit can be auscultated     Medical Decision Making   James Nicholson is a 54 y.o. year old male who presents s/p right radiocephalic fistula revision by plication   Patent radiocephalic fistula without signs or symptoms of steal syndrome  The patient's access will be ready for use 09/12/20  The patient's tunneled dialysis catheter can be removed when Nephrology is comfortable with the performance of the fistula  The patient may follow up on a prn basis   Dagoberto Ligas PA-C [facility] [place] Office: [phone]  Clinic MD: Scot Dock

## 2020-09-07 DIAGNOSIS — N186 End stage renal disease: Secondary | ICD-10-CM | POA: Diagnosis not present

## 2020-09-07 DIAGNOSIS — E1122 Type 2 diabetes mellitus with diabetic chronic kidney disease: Secondary | ICD-10-CM | POA: Diagnosis not present

## 2020-09-07 DIAGNOSIS — D509 Iron deficiency anemia, unspecified: Secondary | ICD-10-CM | POA: Diagnosis not present

## 2020-09-07 DIAGNOSIS — D631 Anemia in chronic kidney disease: Secondary | ICD-10-CM | POA: Diagnosis not present

## 2020-09-07 DIAGNOSIS — N2581 Secondary hyperparathyroidism of renal origin: Secondary | ICD-10-CM | POA: Diagnosis not present

## 2020-09-07 DIAGNOSIS — Z992 Dependence on renal dialysis: Secondary | ICD-10-CM | POA: Diagnosis not present

## 2020-09-09 DIAGNOSIS — N186 End stage renal disease: Secondary | ICD-10-CM | POA: Diagnosis not present

## 2020-09-09 DIAGNOSIS — Z992 Dependence on renal dialysis: Secondary | ICD-10-CM | POA: Diagnosis not present

## 2020-09-09 DIAGNOSIS — D631 Anemia in chronic kidney disease: Secondary | ICD-10-CM | POA: Diagnosis not present

## 2020-09-09 DIAGNOSIS — E1122 Type 2 diabetes mellitus with diabetic chronic kidney disease: Secondary | ICD-10-CM | POA: Diagnosis not present

## 2020-09-09 DIAGNOSIS — D509 Iron deficiency anemia, unspecified: Secondary | ICD-10-CM | POA: Diagnosis not present

## 2020-09-09 DIAGNOSIS — N2581 Secondary hyperparathyroidism of renal origin: Secondary | ICD-10-CM | POA: Diagnosis not present

## 2020-09-12 DIAGNOSIS — D509 Iron deficiency anemia, unspecified: Secondary | ICD-10-CM | POA: Diagnosis not present

## 2020-09-12 DIAGNOSIS — D631 Anemia in chronic kidney disease: Secondary | ICD-10-CM | POA: Diagnosis not present

## 2020-09-12 DIAGNOSIS — E1122 Type 2 diabetes mellitus with diabetic chronic kidney disease: Secondary | ICD-10-CM | POA: Diagnosis not present

## 2020-09-12 DIAGNOSIS — Z992 Dependence on renal dialysis: Secondary | ICD-10-CM | POA: Diagnosis not present

## 2020-09-12 DIAGNOSIS — N2581 Secondary hyperparathyroidism of renal origin: Secondary | ICD-10-CM | POA: Diagnosis not present

## 2020-09-12 DIAGNOSIS — N186 End stage renal disease: Secondary | ICD-10-CM | POA: Diagnosis not present

## 2020-09-14 DIAGNOSIS — N186 End stage renal disease: Secondary | ICD-10-CM | POA: Diagnosis not present

## 2020-09-14 DIAGNOSIS — D631 Anemia in chronic kidney disease: Secondary | ICD-10-CM | POA: Diagnosis not present

## 2020-09-14 DIAGNOSIS — E1122 Type 2 diabetes mellitus with diabetic chronic kidney disease: Secondary | ICD-10-CM | POA: Diagnosis not present

## 2020-09-14 DIAGNOSIS — D509 Iron deficiency anemia, unspecified: Secondary | ICD-10-CM | POA: Diagnosis not present

## 2020-09-14 DIAGNOSIS — N2581 Secondary hyperparathyroidism of renal origin: Secondary | ICD-10-CM | POA: Diagnosis not present

## 2020-09-14 DIAGNOSIS — Z992 Dependence on renal dialysis: Secondary | ICD-10-CM | POA: Diagnosis not present

## 2020-09-16 DIAGNOSIS — D631 Anemia in chronic kidney disease: Secondary | ICD-10-CM | POA: Diagnosis not present

## 2020-09-16 DIAGNOSIS — E1122 Type 2 diabetes mellitus with diabetic chronic kidney disease: Secondary | ICD-10-CM | POA: Diagnosis not present

## 2020-09-16 DIAGNOSIS — D509 Iron deficiency anemia, unspecified: Secondary | ICD-10-CM | POA: Diagnosis not present

## 2020-09-16 DIAGNOSIS — Z992 Dependence on renal dialysis: Secondary | ICD-10-CM | POA: Diagnosis not present

## 2020-09-16 DIAGNOSIS — N2581 Secondary hyperparathyroidism of renal origin: Secondary | ICD-10-CM | POA: Diagnosis not present

## 2020-09-16 DIAGNOSIS — N186 End stage renal disease: Secondary | ICD-10-CM | POA: Diagnosis not present

## 2020-09-18 DIAGNOSIS — Z992 Dependence on renal dialysis: Secondary | ICD-10-CM | POA: Diagnosis not present

## 2020-09-18 DIAGNOSIS — N186 End stage renal disease: Secondary | ICD-10-CM | POA: Diagnosis not present

## 2020-09-18 DIAGNOSIS — E1122 Type 2 diabetes mellitus with diabetic chronic kidney disease: Secondary | ICD-10-CM | POA: Diagnosis not present

## 2020-09-18 DIAGNOSIS — D509 Iron deficiency anemia, unspecified: Secondary | ICD-10-CM | POA: Diagnosis not present

## 2020-09-18 DIAGNOSIS — N2581 Secondary hyperparathyroidism of renal origin: Secondary | ICD-10-CM | POA: Diagnosis not present

## 2020-09-18 DIAGNOSIS — D631 Anemia in chronic kidney disease: Secondary | ICD-10-CM | POA: Diagnosis not present

## 2020-09-20 DIAGNOSIS — Z992 Dependence on renal dialysis: Secondary | ICD-10-CM | POA: Diagnosis not present

## 2020-09-20 DIAGNOSIS — D631 Anemia in chronic kidney disease: Secondary | ICD-10-CM | POA: Diagnosis not present

## 2020-09-20 DIAGNOSIS — N186 End stage renal disease: Secondary | ICD-10-CM | POA: Diagnosis not present

## 2020-09-20 DIAGNOSIS — E1122 Type 2 diabetes mellitus with diabetic chronic kidney disease: Secondary | ICD-10-CM | POA: Diagnosis not present

## 2020-09-20 DIAGNOSIS — N2581 Secondary hyperparathyroidism of renal origin: Secondary | ICD-10-CM | POA: Diagnosis not present

## 2020-09-20 DIAGNOSIS — D509 Iron deficiency anemia, unspecified: Secondary | ICD-10-CM | POA: Diagnosis not present

## 2020-09-23 DIAGNOSIS — N2581 Secondary hyperparathyroidism of renal origin: Secondary | ICD-10-CM | POA: Diagnosis not present

## 2020-09-23 DIAGNOSIS — Z992 Dependence on renal dialysis: Secondary | ICD-10-CM | POA: Diagnosis not present

## 2020-09-23 DIAGNOSIS — D509 Iron deficiency anemia, unspecified: Secondary | ICD-10-CM | POA: Diagnosis not present

## 2020-09-23 DIAGNOSIS — N186 End stage renal disease: Secondary | ICD-10-CM | POA: Diagnosis not present

## 2020-09-23 DIAGNOSIS — D631 Anemia in chronic kidney disease: Secondary | ICD-10-CM | POA: Diagnosis not present

## 2020-09-23 DIAGNOSIS — E1122 Type 2 diabetes mellitus with diabetic chronic kidney disease: Secondary | ICD-10-CM | POA: Diagnosis not present

## 2020-09-26 DIAGNOSIS — D631 Anemia in chronic kidney disease: Secondary | ICD-10-CM | POA: Diagnosis not present

## 2020-09-26 DIAGNOSIS — N2581 Secondary hyperparathyroidism of renal origin: Secondary | ICD-10-CM | POA: Diagnosis not present

## 2020-09-26 DIAGNOSIS — E1122 Type 2 diabetes mellitus with diabetic chronic kidney disease: Secondary | ICD-10-CM | POA: Diagnosis not present

## 2020-09-26 DIAGNOSIS — N186 End stage renal disease: Secondary | ICD-10-CM | POA: Diagnosis not present

## 2020-09-26 DIAGNOSIS — D509 Iron deficiency anemia, unspecified: Secondary | ICD-10-CM | POA: Diagnosis not present

## 2020-09-26 DIAGNOSIS — Z992 Dependence on renal dialysis: Secondary | ICD-10-CM | POA: Diagnosis not present

## 2020-09-27 DIAGNOSIS — N186 End stage renal disease: Secondary | ICD-10-CM | POA: Diagnosis not present

## 2020-09-27 DIAGNOSIS — N032 Chronic nephritic syndrome with diffuse membranous glomerulonephritis: Secondary | ICD-10-CM | POA: Diagnosis not present

## 2020-09-27 DIAGNOSIS — Z992 Dependence on renal dialysis: Secondary | ICD-10-CM | POA: Diagnosis not present

## 2020-09-28 DIAGNOSIS — D509 Iron deficiency anemia, unspecified: Secondary | ICD-10-CM | POA: Diagnosis not present

## 2020-09-28 DIAGNOSIS — Z992 Dependence on renal dialysis: Secondary | ICD-10-CM | POA: Diagnosis not present

## 2020-09-28 DIAGNOSIS — D631 Anemia in chronic kidney disease: Secondary | ICD-10-CM | POA: Diagnosis not present

## 2020-09-28 DIAGNOSIS — E1122 Type 2 diabetes mellitus with diabetic chronic kidney disease: Secondary | ICD-10-CM | POA: Diagnosis not present

## 2020-09-28 DIAGNOSIS — N186 End stage renal disease: Secondary | ICD-10-CM | POA: Diagnosis not present

## 2020-09-28 DIAGNOSIS — N2581 Secondary hyperparathyroidism of renal origin: Secondary | ICD-10-CM | POA: Diagnosis not present

## 2020-09-30 DIAGNOSIS — E1122 Type 2 diabetes mellitus with diabetic chronic kidney disease: Secondary | ICD-10-CM | POA: Diagnosis not present

## 2020-09-30 DIAGNOSIS — N2581 Secondary hyperparathyroidism of renal origin: Secondary | ICD-10-CM | POA: Diagnosis not present

## 2020-09-30 DIAGNOSIS — Z992 Dependence on renal dialysis: Secondary | ICD-10-CM | POA: Diagnosis not present

## 2020-09-30 DIAGNOSIS — D631 Anemia in chronic kidney disease: Secondary | ICD-10-CM | POA: Diagnosis not present

## 2020-09-30 DIAGNOSIS — D509 Iron deficiency anemia, unspecified: Secondary | ICD-10-CM | POA: Diagnosis not present

## 2020-09-30 DIAGNOSIS — N186 End stage renal disease: Secondary | ICD-10-CM | POA: Diagnosis not present

## 2020-10-02 DIAGNOSIS — Z452 Encounter for adjustment and management of vascular access device: Secondary | ICD-10-CM | POA: Diagnosis not present

## 2020-10-03 DIAGNOSIS — N186 End stage renal disease: Secondary | ICD-10-CM | POA: Diagnosis not present

## 2020-10-03 DIAGNOSIS — E1122 Type 2 diabetes mellitus with diabetic chronic kidney disease: Secondary | ICD-10-CM | POA: Diagnosis not present

## 2020-10-03 DIAGNOSIS — D631 Anemia in chronic kidney disease: Secondary | ICD-10-CM | POA: Diagnosis not present

## 2020-10-03 DIAGNOSIS — D509 Iron deficiency anemia, unspecified: Secondary | ICD-10-CM | POA: Diagnosis not present

## 2020-10-03 DIAGNOSIS — N2581 Secondary hyperparathyroidism of renal origin: Secondary | ICD-10-CM | POA: Diagnosis not present

## 2020-10-03 DIAGNOSIS — Z992 Dependence on renal dialysis: Secondary | ICD-10-CM | POA: Diagnosis not present

## 2020-10-04 DIAGNOSIS — J811 Chronic pulmonary edema: Secondary | ICD-10-CM | POA: Diagnosis not present

## 2020-10-04 DIAGNOSIS — Z79899 Other long term (current) drug therapy: Secondary | ICD-10-CM | POA: Diagnosis not present

## 2020-10-04 DIAGNOSIS — R21 Rash and other nonspecific skin eruption: Secondary | ICD-10-CM | POA: Diagnosis not present

## 2020-10-04 DIAGNOSIS — R053 Chronic cough: Secondary | ICD-10-CM | POA: Diagnosis not present

## 2020-10-04 DIAGNOSIS — D696 Thrombocytopenia, unspecified: Secondary | ICD-10-CM | POA: Diagnosis not present

## 2020-10-04 DIAGNOSIS — I776 Arteritis, unspecified: Secondary | ICD-10-CM | POA: Diagnosis not present

## 2020-10-04 DIAGNOSIS — M3130 Wegener's granulomatosis without renal involvement: Secondary | ICD-10-CM | POA: Diagnosis not present

## 2020-10-04 DIAGNOSIS — R7401 Elevation of levels of liver transaminase levels: Secondary | ICD-10-CM | POA: Diagnosis not present

## 2020-10-04 DIAGNOSIS — M3131 Wegener's granulomatosis with renal involvement: Secondary | ICD-10-CM | POA: Diagnosis not present

## 2020-10-04 DIAGNOSIS — B20 Human immunodeficiency virus [HIV] disease: Secondary | ICD-10-CM | POA: Diagnosis not present

## 2020-10-04 DIAGNOSIS — Z8611 Personal history of tuberculosis: Secondary | ICD-10-CM | POA: Diagnosis not present

## 2020-10-04 DIAGNOSIS — Z8709 Personal history of other diseases of the respiratory system: Secondary | ICD-10-CM | POA: Diagnosis not present

## 2020-10-04 DIAGNOSIS — Z8669 Personal history of other diseases of the nervous system and sense organs: Secondary | ICD-10-CM | POA: Diagnosis not present

## 2020-10-05 DIAGNOSIS — E1122 Type 2 diabetes mellitus with diabetic chronic kidney disease: Secondary | ICD-10-CM | POA: Diagnosis not present

## 2020-10-05 DIAGNOSIS — Z992 Dependence on renal dialysis: Secondary | ICD-10-CM | POA: Diagnosis not present

## 2020-10-05 DIAGNOSIS — N186 End stage renal disease: Secondary | ICD-10-CM | POA: Diagnosis not present

## 2020-10-05 DIAGNOSIS — N2581 Secondary hyperparathyroidism of renal origin: Secondary | ICD-10-CM | POA: Diagnosis not present

## 2020-10-05 DIAGNOSIS — D631 Anemia in chronic kidney disease: Secondary | ICD-10-CM | POA: Diagnosis not present

## 2020-10-05 DIAGNOSIS — D509 Iron deficiency anemia, unspecified: Secondary | ICD-10-CM | POA: Diagnosis not present

## 2020-10-07 DIAGNOSIS — Z992 Dependence on renal dialysis: Secondary | ICD-10-CM | POA: Diagnosis not present

## 2020-10-07 DIAGNOSIS — E1122 Type 2 diabetes mellitus with diabetic chronic kidney disease: Secondary | ICD-10-CM | POA: Diagnosis not present

## 2020-10-07 DIAGNOSIS — N186 End stage renal disease: Secondary | ICD-10-CM | POA: Diagnosis not present

## 2020-10-07 DIAGNOSIS — N2581 Secondary hyperparathyroidism of renal origin: Secondary | ICD-10-CM | POA: Diagnosis not present

## 2020-10-07 DIAGNOSIS — D631 Anemia in chronic kidney disease: Secondary | ICD-10-CM | POA: Diagnosis not present

## 2020-10-07 DIAGNOSIS — D509 Iron deficiency anemia, unspecified: Secondary | ICD-10-CM | POA: Diagnosis not present

## 2020-10-10 DIAGNOSIS — D509 Iron deficiency anemia, unspecified: Secondary | ICD-10-CM | POA: Diagnosis not present

## 2020-10-10 DIAGNOSIS — N2581 Secondary hyperparathyroidism of renal origin: Secondary | ICD-10-CM | POA: Diagnosis not present

## 2020-10-10 DIAGNOSIS — N186 End stage renal disease: Secondary | ICD-10-CM | POA: Diagnosis not present

## 2020-10-10 DIAGNOSIS — E1122 Type 2 diabetes mellitus with diabetic chronic kidney disease: Secondary | ICD-10-CM | POA: Diagnosis not present

## 2020-10-10 DIAGNOSIS — Z992 Dependence on renal dialysis: Secondary | ICD-10-CM | POA: Diagnosis not present

## 2020-10-10 DIAGNOSIS — D631 Anemia in chronic kidney disease: Secondary | ICD-10-CM | POA: Diagnosis not present

## 2020-10-11 DIAGNOSIS — M3131 Wegener's granulomatosis with renal involvement: Secondary | ICD-10-CM | POA: Diagnosis not present

## 2020-10-11 DIAGNOSIS — R053 Chronic cough: Secondary | ICD-10-CM | POA: Diagnosis not present

## 2020-10-11 DIAGNOSIS — J342 Deviated nasal septum: Secondary | ICD-10-CM | POA: Diagnosis not present

## 2020-10-11 DIAGNOSIS — J3489 Other specified disorders of nose and nasal sinuses: Secondary | ICD-10-CM | POA: Diagnosis not present

## 2020-10-11 DIAGNOSIS — Z87898 Personal history of other specified conditions: Secondary | ICD-10-CM | POA: Diagnosis not present

## 2020-10-12 ENCOUNTER — Ambulatory Visit (INDEPENDENT_AMBULATORY_CARE_PROVIDER_SITE_OTHER): Payer: Medicare Other

## 2020-10-12 DIAGNOSIS — D631 Anemia in chronic kidney disease: Secondary | ICD-10-CM | POA: Diagnosis not present

## 2020-10-12 DIAGNOSIS — I255 Ischemic cardiomyopathy: Secondary | ICD-10-CM | POA: Diagnosis not present

## 2020-10-12 DIAGNOSIS — D509 Iron deficiency anemia, unspecified: Secondary | ICD-10-CM | POA: Diagnosis not present

## 2020-10-12 DIAGNOSIS — Z992 Dependence on renal dialysis: Secondary | ICD-10-CM | POA: Diagnosis not present

## 2020-10-12 DIAGNOSIS — N186 End stage renal disease: Secondary | ICD-10-CM | POA: Diagnosis not present

## 2020-10-12 DIAGNOSIS — E1122 Type 2 diabetes mellitus with diabetic chronic kidney disease: Secondary | ICD-10-CM | POA: Diagnosis not present

## 2020-10-12 DIAGNOSIS — N2581 Secondary hyperparathyroidism of renal origin: Secondary | ICD-10-CM | POA: Diagnosis not present

## 2020-10-12 LAB — CUP PACEART REMOTE DEVICE CHECK
Battery Remaining Longevity: 96 mo
Battery Voltage: 2.99 V
Brady Statistic RV Percent Paced: 0 %
Date Time Interrogation Session: 20211216042515
HighPow Impedance: 254 Ohm
HighPow Impedance: 38 Ohm
Implantable Lead Implant Date: 20210315
Implantable Lead Location: 753860
Implantable Lead Model: 6935
Implantable Pulse Generator Implant Date: 20190208
Lead Channel Impedance Value: 285 Ohm
Lead Channel Impedance Value: 361 Ohm
Lead Channel Pacing Threshold Amplitude: 0.75 V
Lead Channel Pacing Threshold Pulse Width: 0.4 ms
Lead Channel Sensing Intrinsic Amplitude: 8.625 mV
Lead Channel Sensing Intrinsic Amplitude: 8.625 mV
Lead Channel Setting Pacing Amplitude: 2 V
Lead Channel Setting Pacing Pulse Width: 0.4 ms
Lead Channel Setting Sensing Sensitivity: 0.3 mV

## 2020-10-14 DIAGNOSIS — N186 End stage renal disease: Secondary | ICD-10-CM | POA: Diagnosis not present

## 2020-10-14 DIAGNOSIS — Z992 Dependence on renal dialysis: Secondary | ICD-10-CM | POA: Diagnosis not present

## 2020-10-14 DIAGNOSIS — D631 Anemia in chronic kidney disease: Secondary | ICD-10-CM | POA: Diagnosis not present

## 2020-10-14 DIAGNOSIS — E1122 Type 2 diabetes mellitus with diabetic chronic kidney disease: Secondary | ICD-10-CM | POA: Diagnosis not present

## 2020-10-14 DIAGNOSIS — N2581 Secondary hyperparathyroidism of renal origin: Secondary | ICD-10-CM | POA: Diagnosis not present

## 2020-10-14 DIAGNOSIS — D509 Iron deficiency anemia, unspecified: Secondary | ICD-10-CM | POA: Diagnosis not present

## 2020-10-17 DIAGNOSIS — N2581 Secondary hyperparathyroidism of renal origin: Secondary | ICD-10-CM | POA: Diagnosis not present

## 2020-10-17 DIAGNOSIS — Z992 Dependence on renal dialysis: Secondary | ICD-10-CM | POA: Diagnosis not present

## 2020-10-17 DIAGNOSIS — D509 Iron deficiency anemia, unspecified: Secondary | ICD-10-CM | POA: Diagnosis not present

## 2020-10-17 DIAGNOSIS — E1122 Type 2 diabetes mellitus with diabetic chronic kidney disease: Secondary | ICD-10-CM | POA: Diagnosis not present

## 2020-10-17 DIAGNOSIS — N186 End stage renal disease: Secondary | ICD-10-CM | POA: Diagnosis not present

## 2020-10-17 DIAGNOSIS — D631 Anemia in chronic kidney disease: Secondary | ICD-10-CM | POA: Diagnosis not present

## 2020-10-19 DIAGNOSIS — Z992 Dependence on renal dialysis: Secondary | ICD-10-CM | POA: Diagnosis not present

## 2020-10-19 DIAGNOSIS — D509 Iron deficiency anemia, unspecified: Secondary | ICD-10-CM | POA: Diagnosis not present

## 2020-10-19 DIAGNOSIS — E1122 Type 2 diabetes mellitus with diabetic chronic kidney disease: Secondary | ICD-10-CM | POA: Diagnosis not present

## 2020-10-19 DIAGNOSIS — N2581 Secondary hyperparathyroidism of renal origin: Secondary | ICD-10-CM | POA: Diagnosis not present

## 2020-10-19 DIAGNOSIS — N186 End stage renal disease: Secondary | ICD-10-CM | POA: Diagnosis not present

## 2020-10-19 DIAGNOSIS — D631 Anemia in chronic kidney disease: Secondary | ICD-10-CM | POA: Diagnosis not present

## 2020-10-22 DIAGNOSIS — D631 Anemia in chronic kidney disease: Secondary | ICD-10-CM | POA: Diagnosis not present

## 2020-10-22 DIAGNOSIS — D509 Iron deficiency anemia, unspecified: Secondary | ICD-10-CM | POA: Diagnosis not present

## 2020-10-22 DIAGNOSIS — Z992 Dependence on renal dialysis: Secondary | ICD-10-CM | POA: Diagnosis not present

## 2020-10-22 DIAGNOSIS — N2581 Secondary hyperparathyroidism of renal origin: Secondary | ICD-10-CM | POA: Diagnosis not present

## 2020-10-22 DIAGNOSIS — N186 End stage renal disease: Secondary | ICD-10-CM | POA: Diagnosis not present

## 2020-10-22 DIAGNOSIS — E1122 Type 2 diabetes mellitus with diabetic chronic kidney disease: Secondary | ICD-10-CM | POA: Diagnosis not present

## 2020-10-24 DIAGNOSIS — N2581 Secondary hyperparathyroidism of renal origin: Secondary | ICD-10-CM | POA: Diagnosis not present

## 2020-10-24 DIAGNOSIS — N186 End stage renal disease: Secondary | ICD-10-CM | POA: Diagnosis not present

## 2020-10-24 DIAGNOSIS — D509 Iron deficiency anemia, unspecified: Secondary | ICD-10-CM | POA: Diagnosis not present

## 2020-10-24 DIAGNOSIS — D631 Anemia in chronic kidney disease: Secondary | ICD-10-CM | POA: Diagnosis not present

## 2020-10-24 DIAGNOSIS — E1122 Type 2 diabetes mellitus with diabetic chronic kidney disease: Secondary | ICD-10-CM | POA: Diagnosis not present

## 2020-10-24 DIAGNOSIS — Z992 Dependence on renal dialysis: Secondary | ICD-10-CM | POA: Diagnosis not present

## 2020-10-26 DIAGNOSIS — D631 Anemia in chronic kidney disease: Secondary | ICD-10-CM | POA: Diagnosis not present

## 2020-10-26 DIAGNOSIS — E1122 Type 2 diabetes mellitus with diabetic chronic kidney disease: Secondary | ICD-10-CM | POA: Diagnosis not present

## 2020-10-26 DIAGNOSIS — N186 End stage renal disease: Secondary | ICD-10-CM | POA: Diagnosis not present

## 2020-10-26 DIAGNOSIS — Z23 Encounter for immunization: Secondary | ICD-10-CM | POA: Diagnosis not present

## 2020-10-26 DIAGNOSIS — Z992 Dependence on renal dialysis: Secondary | ICD-10-CM | POA: Diagnosis not present

## 2020-10-26 DIAGNOSIS — D509 Iron deficiency anemia, unspecified: Secondary | ICD-10-CM | POA: Diagnosis not present

## 2020-10-26 DIAGNOSIS — N2581 Secondary hyperparathyroidism of renal origin: Secondary | ICD-10-CM | POA: Diagnosis not present

## 2020-10-26 NOTE — Progress Notes (Signed)
Remote ICD transmission.   

## 2020-10-27 DIAGNOSIS — R7989 Other specified abnormal findings of blood chemistry: Secondary | ICD-10-CM | POA: Diagnosis not present

## 2020-10-27 DIAGNOSIS — I2510 Atherosclerotic heart disease of native coronary artery without angina pectoris: Secondary | ICD-10-CM | POA: Diagnosis not present

## 2020-10-27 DIAGNOSIS — I2584 Coronary atherosclerosis due to calcified coronary lesion: Secondary | ICD-10-CM | POA: Diagnosis not present

## 2020-10-27 DIAGNOSIS — R188 Other ascites: Secondary | ICD-10-CM | POA: Diagnosis not present

## 2020-10-27 DIAGNOSIS — R059 Cough, unspecified: Secondary | ICD-10-CM | POA: Diagnosis not present

## 2020-10-27 DIAGNOSIS — I517 Cardiomegaly: Secondary | ICD-10-CM | POA: Diagnosis not present

## 2020-10-27 DIAGNOSIS — J9811 Atelectasis: Secondary | ICD-10-CM | POA: Diagnosis not present

## 2020-10-27 DIAGNOSIS — R791 Abnormal coagulation profile: Secondary | ICD-10-CM | POA: Diagnosis not present

## 2020-10-27 DIAGNOSIS — R053 Chronic cough: Secondary | ICD-10-CM | POA: Diagnosis not present

## 2020-10-28 DIAGNOSIS — D509 Iron deficiency anemia, unspecified: Secondary | ICD-10-CM | POA: Diagnosis not present

## 2020-10-28 DIAGNOSIS — N2581 Secondary hyperparathyroidism of renal origin: Secondary | ICD-10-CM | POA: Diagnosis not present

## 2020-10-28 DIAGNOSIS — N186 End stage renal disease: Secondary | ICD-10-CM | POA: Diagnosis not present

## 2020-10-28 DIAGNOSIS — D631 Anemia in chronic kidney disease: Secondary | ICD-10-CM | POA: Diagnosis not present

## 2020-10-28 DIAGNOSIS — E1122 Type 2 diabetes mellitus with diabetic chronic kidney disease: Secondary | ICD-10-CM | POA: Diagnosis not present

## 2020-10-28 DIAGNOSIS — N032 Chronic nephritic syndrome with diffuse membranous glomerulonephritis: Secondary | ICD-10-CM | POA: Diagnosis not present

## 2020-10-28 DIAGNOSIS — Z992 Dependence on renal dialysis: Secondary | ICD-10-CM | POA: Diagnosis not present

## 2020-10-31 DIAGNOSIS — D631 Anemia in chronic kidney disease: Secondary | ICD-10-CM | POA: Diagnosis not present

## 2020-10-31 DIAGNOSIS — D509 Iron deficiency anemia, unspecified: Secondary | ICD-10-CM | POA: Diagnosis not present

## 2020-10-31 DIAGNOSIS — N2581 Secondary hyperparathyroidism of renal origin: Secondary | ICD-10-CM | POA: Diagnosis not present

## 2020-10-31 DIAGNOSIS — Z992 Dependence on renal dialysis: Secondary | ICD-10-CM | POA: Diagnosis not present

## 2020-10-31 DIAGNOSIS — N186 End stage renal disease: Secondary | ICD-10-CM | POA: Diagnosis not present

## 2020-10-31 DIAGNOSIS — E1122 Type 2 diabetes mellitus with diabetic chronic kidney disease: Secondary | ICD-10-CM | POA: Diagnosis not present

## 2020-11-02 DIAGNOSIS — N186 End stage renal disease: Secondary | ICD-10-CM | POA: Diagnosis not present

## 2020-11-02 DIAGNOSIS — Z992 Dependence on renal dialysis: Secondary | ICD-10-CM | POA: Diagnosis not present

## 2020-11-02 DIAGNOSIS — E1122 Type 2 diabetes mellitus with diabetic chronic kidney disease: Secondary | ICD-10-CM | POA: Diagnosis not present

## 2020-11-02 DIAGNOSIS — N2581 Secondary hyperparathyroidism of renal origin: Secondary | ICD-10-CM | POA: Diagnosis not present

## 2020-11-02 DIAGNOSIS — D631 Anemia in chronic kidney disease: Secondary | ICD-10-CM | POA: Diagnosis not present

## 2020-11-02 DIAGNOSIS — D509 Iron deficiency anemia, unspecified: Secondary | ICD-10-CM | POA: Diagnosis not present

## 2020-11-04 DIAGNOSIS — D509 Iron deficiency anemia, unspecified: Secondary | ICD-10-CM | POA: Diagnosis not present

## 2020-11-04 DIAGNOSIS — E1122 Type 2 diabetes mellitus with diabetic chronic kidney disease: Secondary | ICD-10-CM | POA: Diagnosis not present

## 2020-11-04 DIAGNOSIS — N186 End stage renal disease: Secondary | ICD-10-CM | POA: Diagnosis not present

## 2020-11-04 DIAGNOSIS — D631 Anemia in chronic kidney disease: Secondary | ICD-10-CM | POA: Diagnosis not present

## 2020-11-04 DIAGNOSIS — Z992 Dependence on renal dialysis: Secondary | ICD-10-CM | POA: Diagnosis not present

## 2020-11-04 DIAGNOSIS — N2581 Secondary hyperparathyroidism of renal origin: Secondary | ICD-10-CM | POA: Diagnosis not present

## 2020-11-07 DIAGNOSIS — D631 Anemia in chronic kidney disease: Secondary | ICD-10-CM | POA: Diagnosis not present

## 2020-11-07 DIAGNOSIS — Z992 Dependence on renal dialysis: Secondary | ICD-10-CM | POA: Diagnosis not present

## 2020-11-07 DIAGNOSIS — D509 Iron deficiency anemia, unspecified: Secondary | ICD-10-CM | POA: Diagnosis not present

## 2020-11-07 DIAGNOSIS — E1122 Type 2 diabetes mellitus with diabetic chronic kidney disease: Secondary | ICD-10-CM | POA: Diagnosis not present

## 2020-11-07 DIAGNOSIS — N186 End stage renal disease: Secondary | ICD-10-CM | POA: Diagnosis not present

## 2020-11-07 DIAGNOSIS — N2581 Secondary hyperparathyroidism of renal origin: Secondary | ICD-10-CM | POA: Diagnosis not present

## 2020-11-09 DIAGNOSIS — Z992 Dependence on renal dialysis: Secondary | ICD-10-CM | POA: Diagnosis not present

## 2020-11-09 DIAGNOSIS — N2581 Secondary hyperparathyroidism of renal origin: Secondary | ICD-10-CM | POA: Diagnosis not present

## 2020-11-09 DIAGNOSIS — N186 End stage renal disease: Secondary | ICD-10-CM | POA: Diagnosis not present

## 2020-11-09 DIAGNOSIS — E1122 Type 2 diabetes mellitus with diabetic chronic kidney disease: Secondary | ICD-10-CM | POA: Diagnosis not present

## 2020-11-09 DIAGNOSIS — D631 Anemia in chronic kidney disease: Secondary | ICD-10-CM | POA: Diagnosis not present

## 2020-11-09 DIAGNOSIS — D509 Iron deficiency anemia, unspecified: Secondary | ICD-10-CM | POA: Diagnosis not present

## 2020-11-11 DIAGNOSIS — N2581 Secondary hyperparathyroidism of renal origin: Secondary | ICD-10-CM | POA: Diagnosis not present

## 2020-11-11 DIAGNOSIS — Z992 Dependence on renal dialysis: Secondary | ICD-10-CM | POA: Diagnosis not present

## 2020-11-11 DIAGNOSIS — D631 Anemia in chronic kidney disease: Secondary | ICD-10-CM | POA: Diagnosis not present

## 2020-11-11 DIAGNOSIS — E1122 Type 2 diabetes mellitus with diabetic chronic kidney disease: Secondary | ICD-10-CM | POA: Diagnosis not present

## 2020-11-11 DIAGNOSIS — N186 End stage renal disease: Secondary | ICD-10-CM | POA: Diagnosis not present

## 2020-11-11 DIAGNOSIS — D509 Iron deficiency anemia, unspecified: Secondary | ICD-10-CM | POA: Diagnosis not present

## 2020-11-14 DIAGNOSIS — E1122 Type 2 diabetes mellitus with diabetic chronic kidney disease: Secondary | ICD-10-CM | POA: Diagnosis not present

## 2020-11-14 DIAGNOSIS — Z992 Dependence on renal dialysis: Secondary | ICD-10-CM | POA: Diagnosis not present

## 2020-11-14 DIAGNOSIS — D631 Anemia in chronic kidney disease: Secondary | ICD-10-CM | POA: Diagnosis not present

## 2020-11-14 DIAGNOSIS — D509 Iron deficiency anemia, unspecified: Secondary | ICD-10-CM | POA: Diagnosis not present

## 2020-11-14 DIAGNOSIS — N2581 Secondary hyperparathyroidism of renal origin: Secondary | ICD-10-CM | POA: Diagnosis not present

## 2020-11-14 DIAGNOSIS — N186 End stage renal disease: Secondary | ICD-10-CM | POA: Diagnosis not present

## 2020-11-16 DIAGNOSIS — D509 Iron deficiency anemia, unspecified: Secondary | ICD-10-CM | POA: Diagnosis not present

## 2020-11-16 DIAGNOSIS — E1122 Type 2 diabetes mellitus with diabetic chronic kidney disease: Secondary | ICD-10-CM | POA: Diagnosis not present

## 2020-11-16 DIAGNOSIS — Z992 Dependence on renal dialysis: Secondary | ICD-10-CM | POA: Diagnosis not present

## 2020-11-16 DIAGNOSIS — N2581 Secondary hyperparathyroidism of renal origin: Secondary | ICD-10-CM | POA: Diagnosis not present

## 2020-11-16 DIAGNOSIS — D631 Anemia in chronic kidney disease: Secondary | ICD-10-CM | POA: Diagnosis not present

## 2020-11-16 DIAGNOSIS — N186 End stage renal disease: Secondary | ICD-10-CM | POA: Diagnosis not present

## 2020-11-18 DIAGNOSIS — N186 End stage renal disease: Secondary | ICD-10-CM | POA: Diagnosis not present

## 2020-11-18 DIAGNOSIS — E1122 Type 2 diabetes mellitus with diabetic chronic kidney disease: Secondary | ICD-10-CM | POA: Diagnosis not present

## 2020-11-18 DIAGNOSIS — N2581 Secondary hyperparathyroidism of renal origin: Secondary | ICD-10-CM | POA: Diagnosis not present

## 2020-11-18 DIAGNOSIS — Z992 Dependence on renal dialysis: Secondary | ICD-10-CM | POA: Diagnosis not present

## 2020-11-18 DIAGNOSIS — D509 Iron deficiency anemia, unspecified: Secondary | ICD-10-CM | POA: Diagnosis not present

## 2020-11-18 DIAGNOSIS — D631 Anemia in chronic kidney disease: Secondary | ICD-10-CM | POA: Diagnosis not present

## 2020-11-21 DIAGNOSIS — D509 Iron deficiency anemia, unspecified: Secondary | ICD-10-CM | POA: Diagnosis not present

## 2020-11-21 DIAGNOSIS — N186 End stage renal disease: Secondary | ICD-10-CM | POA: Diagnosis not present

## 2020-11-21 DIAGNOSIS — D631 Anemia in chronic kidney disease: Secondary | ICD-10-CM | POA: Diagnosis not present

## 2020-11-21 DIAGNOSIS — Z992 Dependence on renal dialysis: Secondary | ICD-10-CM | POA: Diagnosis not present

## 2020-11-21 DIAGNOSIS — N2581 Secondary hyperparathyroidism of renal origin: Secondary | ICD-10-CM | POA: Diagnosis not present

## 2020-11-21 DIAGNOSIS — E1122 Type 2 diabetes mellitus with diabetic chronic kidney disease: Secondary | ICD-10-CM | POA: Diagnosis not present

## 2020-11-22 DIAGNOSIS — R748 Abnormal levels of other serum enzymes: Secondary | ICD-10-CM | POA: Diagnosis not present

## 2020-11-22 DIAGNOSIS — I120 Hypertensive chronic kidney disease with stage 5 chronic kidney disease or end stage renal disease: Secondary | ICD-10-CM | POA: Diagnosis not present

## 2020-11-22 DIAGNOSIS — E785 Hyperlipidemia, unspecified: Secondary | ICD-10-CM | POA: Diagnosis not present

## 2020-11-22 DIAGNOSIS — I509 Heart failure, unspecified: Secondary | ICD-10-CM | POA: Diagnosis not present

## 2020-11-22 DIAGNOSIS — Z8674 Personal history of sudden cardiac arrest: Secondary | ICD-10-CM | POA: Diagnosis not present

## 2020-11-22 DIAGNOSIS — N186 End stage renal disease: Secondary | ICD-10-CM | POA: Diagnosis not present

## 2020-11-22 DIAGNOSIS — R188 Other ascites: Secondary | ICD-10-CM | POA: Diagnosis not present

## 2020-11-22 DIAGNOSIS — Z95810 Presence of automatic (implantable) cardiac defibrillator: Secondary | ICD-10-CM | POA: Diagnosis not present

## 2020-11-22 DIAGNOSIS — M3130 Wegener's granulomatosis without renal involvement: Secondary | ICD-10-CM | POA: Diagnosis not present

## 2020-11-23 DIAGNOSIS — E1122 Type 2 diabetes mellitus with diabetic chronic kidney disease: Secondary | ICD-10-CM | POA: Diagnosis not present

## 2020-11-23 DIAGNOSIS — D631 Anemia in chronic kidney disease: Secondary | ICD-10-CM | POA: Diagnosis not present

## 2020-11-23 DIAGNOSIS — D509 Iron deficiency anemia, unspecified: Secondary | ICD-10-CM | POA: Diagnosis not present

## 2020-11-23 DIAGNOSIS — N2581 Secondary hyperparathyroidism of renal origin: Secondary | ICD-10-CM | POA: Diagnosis not present

## 2020-11-23 DIAGNOSIS — Z992 Dependence on renal dialysis: Secondary | ICD-10-CM | POA: Diagnosis not present

## 2020-11-23 DIAGNOSIS — N186 End stage renal disease: Secondary | ICD-10-CM | POA: Diagnosis not present

## 2020-11-25 DIAGNOSIS — E1122 Type 2 diabetes mellitus with diabetic chronic kidney disease: Secondary | ICD-10-CM | POA: Diagnosis not present

## 2020-11-25 DIAGNOSIS — N2581 Secondary hyperparathyroidism of renal origin: Secondary | ICD-10-CM | POA: Diagnosis not present

## 2020-11-25 DIAGNOSIS — N186 End stage renal disease: Secondary | ICD-10-CM | POA: Diagnosis not present

## 2020-11-25 DIAGNOSIS — Z992 Dependence on renal dialysis: Secondary | ICD-10-CM | POA: Diagnosis not present

## 2020-11-25 DIAGNOSIS — D631 Anemia in chronic kidney disease: Secondary | ICD-10-CM | POA: Diagnosis not present

## 2020-11-25 DIAGNOSIS — D509 Iron deficiency anemia, unspecified: Secondary | ICD-10-CM | POA: Diagnosis not present

## 2020-11-27 DIAGNOSIS — R188 Other ascites: Secondary | ICD-10-CM | POA: Diagnosis not present

## 2020-11-28 DIAGNOSIS — N186 End stage renal disease: Secondary | ICD-10-CM | POA: Diagnosis not present

## 2020-11-28 DIAGNOSIS — N2581 Secondary hyperparathyroidism of renal origin: Secondary | ICD-10-CM | POA: Diagnosis not present

## 2020-11-28 DIAGNOSIS — N032 Chronic nephritic syndrome with diffuse membranous glomerulonephritis: Secondary | ICD-10-CM | POA: Diagnosis not present

## 2020-11-28 DIAGNOSIS — Z992 Dependence on renal dialysis: Secondary | ICD-10-CM | POA: Diagnosis not present

## 2020-11-28 DIAGNOSIS — E1122 Type 2 diabetes mellitus with diabetic chronic kidney disease: Secondary | ICD-10-CM | POA: Diagnosis not present

## 2020-11-28 DIAGNOSIS — D631 Anemia in chronic kidney disease: Secondary | ICD-10-CM | POA: Diagnosis not present

## 2020-11-28 DIAGNOSIS — D509 Iron deficiency anemia, unspecified: Secondary | ICD-10-CM | POA: Diagnosis not present

## 2020-11-28 DIAGNOSIS — Z23 Encounter for immunization: Secondary | ICD-10-CM | POA: Diagnosis not present

## 2020-11-30 DIAGNOSIS — D631 Anemia in chronic kidney disease: Secondary | ICD-10-CM | POA: Diagnosis not present

## 2020-11-30 DIAGNOSIS — E1122 Type 2 diabetes mellitus with diabetic chronic kidney disease: Secondary | ICD-10-CM | POA: Diagnosis not present

## 2020-11-30 DIAGNOSIS — N2581 Secondary hyperparathyroidism of renal origin: Secondary | ICD-10-CM | POA: Diagnosis not present

## 2020-11-30 DIAGNOSIS — N186 End stage renal disease: Secondary | ICD-10-CM | POA: Diagnosis not present

## 2020-11-30 DIAGNOSIS — Z992 Dependence on renal dialysis: Secondary | ICD-10-CM | POA: Diagnosis not present

## 2020-11-30 DIAGNOSIS — D509 Iron deficiency anemia, unspecified: Secondary | ICD-10-CM | POA: Diagnosis not present

## 2020-12-02 DIAGNOSIS — D509 Iron deficiency anemia, unspecified: Secondary | ICD-10-CM | POA: Diagnosis not present

## 2020-12-02 DIAGNOSIS — D631 Anemia in chronic kidney disease: Secondary | ICD-10-CM | POA: Diagnosis not present

## 2020-12-02 DIAGNOSIS — N186 End stage renal disease: Secondary | ICD-10-CM | POA: Diagnosis not present

## 2020-12-02 DIAGNOSIS — Z992 Dependence on renal dialysis: Secondary | ICD-10-CM | POA: Diagnosis not present

## 2020-12-02 DIAGNOSIS — N2581 Secondary hyperparathyroidism of renal origin: Secondary | ICD-10-CM | POA: Diagnosis not present

## 2020-12-02 DIAGNOSIS — E1122 Type 2 diabetes mellitus with diabetic chronic kidney disease: Secondary | ICD-10-CM | POA: Diagnosis not present

## 2020-12-05 DIAGNOSIS — E1122 Type 2 diabetes mellitus with diabetic chronic kidney disease: Secondary | ICD-10-CM | POA: Diagnosis not present

## 2020-12-05 DIAGNOSIS — D631 Anemia in chronic kidney disease: Secondary | ICD-10-CM | POA: Diagnosis not present

## 2020-12-05 DIAGNOSIS — Z992 Dependence on renal dialysis: Secondary | ICD-10-CM | POA: Diagnosis not present

## 2020-12-05 DIAGNOSIS — N2581 Secondary hyperparathyroidism of renal origin: Secondary | ICD-10-CM | POA: Diagnosis not present

## 2020-12-05 DIAGNOSIS — D509 Iron deficiency anemia, unspecified: Secondary | ICD-10-CM | POA: Diagnosis not present

## 2020-12-05 DIAGNOSIS — N186 End stage renal disease: Secondary | ICD-10-CM | POA: Diagnosis not present

## 2020-12-07 DIAGNOSIS — E1122 Type 2 diabetes mellitus with diabetic chronic kidney disease: Secondary | ICD-10-CM | POA: Diagnosis not present

## 2020-12-07 DIAGNOSIS — N186 End stage renal disease: Secondary | ICD-10-CM | POA: Diagnosis not present

## 2020-12-07 DIAGNOSIS — D509 Iron deficiency anemia, unspecified: Secondary | ICD-10-CM | POA: Diagnosis not present

## 2020-12-07 DIAGNOSIS — D631 Anemia in chronic kidney disease: Secondary | ICD-10-CM | POA: Diagnosis not present

## 2020-12-07 DIAGNOSIS — Z992 Dependence on renal dialysis: Secondary | ICD-10-CM | POA: Diagnosis not present

## 2020-12-07 DIAGNOSIS — N2581 Secondary hyperparathyroidism of renal origin: Secondary | ICD-10-CM | POA: Diagnosis not present

## 2020-12-09 DIAGNOSIS — Z992 Dependence on renal dialysis: Secondary | ICD-10-CM | POA: Diagnosis not present

## 2020-12-09 DIAGNOSIS — D509 Iron deficiency anemia, unspecified: Secondary | ICD-10-CM | POA: Diagnosis not present

## 2020-12-09 DIAGNOSIS — E1122 Type 2 diabetes mellitus with diabetic chronic kidney disease: Secondary | ICD-10-CM | POA: Diagnosis not present

## 2020-12-09 DIAGNOSIS — N186 End stage renal disease: Secondary | ICD-10-CM | POA: Diagnosis not present

## 2020-12-09 DIAGNOSIS — N2581 Secondary hyperparathyroidism of renal origin: Secondary | ICD-10-CM | POA: Diagnosis not present

## 2020-12-09 DIAGNOSIS — D631 Anemia in chronic kidney disease: Secondary | ICD-10-CM | POA: Diagnosis not present

## 2020-12-12 DIAGNOSIS — E1122 Type 2 diabetes mellitus with diabetic chronic kidney disease: Secondary | ICD-10-CM | POA: Diagnosis not present

## 2020-12-12 DIAGNOSIS — D631 Anemia in chronic kidney disease: Secondary | ICD-10-CM | POA: Diagnosis not present

## 2020-12-12 DIAGNOSIS — D509 Iron deficiency anemia, unspecified: Secondary | ICD-10-CM | POA: Diagnosis not present

## 2020-12-12 DIAGNOSIS — Z992 Dependence on renal dialysis: Secondary | ICD-10-CM | POA: Diagnosis not present

## 2020-12-12 DIAGNOSIS — N2581 Secondary hyperparathyroidism of renal origin: Secondary | ICD-10-CM | POA: Diagnosis not present

## 2020-12-12 DIAGNOSIS — N186 End stage renal disease: Secondary | ICD-10-CM | POA: Diagnosis not present

## 2020-12-13 DIAGNOSIS — Z8659 Personal history of other mental and behavioral disorders: Secondary | ICD-10-CM | POA: Diagnosis not present

## 2020-12-13 DIAGNOSIS — N186 End stage renal disease: Secondary | ICD-10-CM | POA: Diagnosis not present

## 2020-12-13 DIAGNOSIS — R161 Splenomegaly, not elsewhere classified: Secondary | ICD-10-CM | POA: Diagnosis not present

## 2020-12-13 DIAGNOSIS — I132 Hypertensive heart and chronic kidney disease with heart failure and with stage 5 chronic kidney disease, or end stage renal disease: Secondary | ICD-10-CM | POA: Diagnosis not present

## 2020-12-13 DIAGNOSIS — Z95810 Presence of automatic (implantable) cardiac defibrillator: Secondary | ICD-10-CM | POA: Diagnosis not present

## 2020-12-13 DIAGNOSIS — Z9189 Other specified personal risk factors, not elsewhere classified: Secondary | ICD-10-CM | POA: Diagnosis not present

## 2020-12-13 DIAGNOSIS — E785 Hyperlipidemia, unspecified: Secondary | ICD-10-CM | POA: Diagnosis not present

## 2020-12-13 DIAGNOSIS — E1122 Type 2 diabetes mellitus with diabetic chronic kidney disease: Secondary | ICD-10-CM | POA: Diagnosis not present

## 2020-12-13 DIAGNOSIS — Z992 Dependence on renal dialysis: Secondary | ICD-10-CM | POA: Diagnosis not present

## 2020-12-13 DIAGNOSIS — R188 Other ascites: Secondary | ICD-10-CM | POA: Diagnosis not present

## 2020-12-13 DIAGNOSIS — I509 Heart failure, unspecified: Secondary | ICD-10-CM | POA: Diagnosis not present

## 2020-12-14 DIAGNOSIS — E1122 Type 2 diabetes mellitus with diabetic chronic kidney disease: Secondary | ICD-10-CM | POA: Diagnosis not present

## 2020-12-14 DIAGNOSIS — D631 Anemia in chronic kidney disease: Secondary | ICD-10-CM | POA: Diagnosis not present

## 2020-12-14 DIAGNOSIS — N2581 Secondary hyperparathyroidism of renal origin: Secondary | ICD-10-CM | POA: Diagnosis not present

## 2020-12-14 DIAGNOSIS — N186 End stage renal disease: Secondary | ICD-10-CM | POA: Diagnosis not present

## 2020-12-14 DIAGNOSIS — D509 Iron deficiency anemia, unspecified: Secondary | ICD-10-CM | POA: Diagnosis not present

## 2020-12-14 DIAGNOSIS — Z992 Dependence on renal dialysis: Secondary | ICD-10-CM | POA: Diagnosis not present

## 2020-12-16 DIAGNOSIS — D631 Anemia in chronic kidney disease: Secondary | ICD-10-CM | POA: Diagnosis not present

## 2020-12-16 DIAGNOSIS — D509 Iron deficiency anemia, unspecified: Secondary | ICD-10-CM | POA: Diagnosis not present

## 2020-12-16 DIAGNOSIS — Z992 Dependence on renal dialysis: Secondary | ICD-10-CM | POA: Diagnosis not present

## 2020-12-16 DIAGNOSIS — N2581 Secondary hyperparathyroidism of renal origin: Secondary | ICD-10-CM | POA: Diagnosis not present

## 2020-12-16 DIAGNOSIS — E1122 Type 2 diabetes mellitus with diabetic chronic kidney disease: Secondary | ICD-10-CM | POA: Diagnosis not present

## 2020-12-16 DIAGNOSIS — N186 End stage renal disease: Secondary | ICD-10-CM | POA: Diagnosis not present

## 2020-12-18 DIAGNOSIS — R188 Other ascites: Secondary | ICD-10-CM | POA: Diagnosis not present

## 2020-12-19 DIAGNOSIS — E1122 Type 2 diabetes mellitus with diabetic chronic kidney disease: Secondary | ICD-10-CM | POA: Diagnosis not present

## 2020-12-19 DIAGNOSIS — D631 Anemia in chronic kidney disease: Secondary | ICD-10-CM | POA: Diagnosis not present

## 2020-12-19 DIAGNOSIS — D509 Iron deficiency anemia, unspecified: Secondary | ICD-10-CM | POA: Diagnosis not present

## 2020-12-19 DIAGNOSIS — N186 End stage renal disease: Secondary | ICD-10-CM | POA: Diagnosis not present

## 2020-12-19 DIAGNOSIS — Z992 Dependence on renal dialysis: Secondary | ICD-10-CM | POA: Diagnosis not present

## 2020-12-19 DIAGNOSIS — N2581 Secondary hyperparathyroidism of renal origin: Secondary | ICD-10-CM | POA: Diagnosis not present

## 2020-12-21 DIAGNOSIS — E1122 Type 2 diabetes mellitus with diabetic chronic kidney disease: Secondary | ICD-10-CM | POA: Diagnosis not present

## 2020-12-21 DIAGNOSIS — N186 End stage renal disease: Secondary | ICD-10-CM | POA: Diagnosis not present

## 2020-12-21 DIAGNOSIS — N2581 Secondary hyperparathyroidism of renal origin: Secondary | ICD-10-CM | POA: Diagnosis not present

## 2020-12-21 DIAGNOSIS — Z992 Dependence on renal dialysis: Secondary | ICD-10-CM | POA: Diagnosis not present

## 2020-12-21 DIAGNOSIS — D631 Anemia in chronic kidney disease: Secondary | ICD-10-CM | POA: Diagnosis not present

## 2020-12-21 DIAGNOSIS — D509 Iron deficiency anemia, unspecified: Secondary | ICD-10-CM | POA: Diagnosis not present

## 2020-12-22 DIAGNOSIS — M3131 Wegener's granulomatosis with renal involvement: Secondary | ICD-10-CM | POA: Diagnosis not present

## 2020-12-22 DIAGNOSIS — I776 Arteritis, unspecified: Secondary | ICD-10-CM | POA: Diagnosis not present

## 2020-12-23 DIAGNOSIS — Z992 Dependence on renal dialysis: Secondary | ICD-10-CM | POA: Diagnosis not present

## 2020-12-23 DIAGNOSIS — D509 Iron deficiency anemia, unspecified: Secondary | ICD-10-CM | POA: Diagnosis not present

## 2020-12-23 DIAGNOSIS — D631 Anemia in chronic kidney disease: Secondary | ICD-10-CM | POA: Diagnosis not present

## 2020-12-23 DIAGNOSIS — E1122 Type 2 diabetes mellitus with diabetic chronic kidney disease: Secondary | ICD-10-CM | POA: Diagnosis not present

## 2020-12-23 DIAGNOSIS — N186 End stage renal disease: Secondary | ICD-10-CM | POA: Diagnosis not present

## 2020-12-23 DIAGNOSIS — N2581 Secondary hyperparathyroidism of renal origin: Secondary | ICD-10-CM | POA: Diagnosis not present

## 2020-12-26 DIAGNOSIS — D631 Anemia in chronic kidney disease: Secondary | ICD-10-CM | POA: Diagnosis not present

## 2020-12-26 DIAGNOSIS — Z992 Dependence on renal dialysis: Secondary | ICD-10-CM | POA: Diagnosis not present

## 2020-12-26 DIAGNOSIS — D509 Iron deficiency anemia, unspecified: Secondary | ICD-10-CM | POA: Diagnosis not present

## 2020-12-26 DIAGNOSIS — Z23 Encounter for immunization: Secondary | ICD-10-CM | POA: Diagnosis not present

## 2020-12-26 DIAGNOSIS — N2581 Secondary hyperparathyroidism of renal origin: Secondary | ICD-10-CM | POA: Diagnosis not present

## 2020-12-26 DIAGNOSIS — N032 Chronic nephritic syndrome with diffuse membranous glomerulonephritis: Secondary | ICD-10-CM | POA: Diagnosis not present

## 2020-12-26 DIAGNOSIS — E875 Hyperkalemia: Secondary | ICD-10-CM | POA: Diagnosis not present

## 2020-12-26 DIAGNOSIS — N186 End stage renal disease: Secondary | ICD-10-CM | POA: Diagnosis not present

## 2020-12-26 DIAGNOSIS — E1122 Type 2 diabetes mellitus with diabetic chronic kidney disease: Secondary | ICD-10-CM | POA: Diagnosis not present

## 2020-12-28 DIAGNOSIS — N2581 Secondary hyperparathyroidism of renal origin: Secondary | ICD-10-CM | POA: Diagnosis not present

## 2020-12-28 DIAGNOSIS — N186 End stage renal disease: Secondary | ICD-10-CM | POA: Diagnosis not present

## 2020-12-28 DIAGNOSIS — E875 Hyperkalemia: Secondary | ICD-10-CM | POA: Diagnosis not present

## 2020-12-28 DIAGNOSIS — D631 Anemia in chronic kidney disease: Secondary | ICD-10-CM | POA: Diagnosis not present

## 2020-12-28 DIAGNOSIS — Z992 Dependence on renal dialysis: Secondary | ICD-10-CM | POA: Diagnosis not present

## 2020-12-28 DIAGNOSIS — D509 Iron deficiency anemia, unspecified: Secondary | ICD-10-CM | POA: Diagnosis not present

## 2020-12-30 DIAGNOSIS — N2581 Secondary hyperparathyroidism of renal origin: Secondary | ICD-10-CM | POA: Diagnosis not present

## 2020-12-30 DIAGNOSIS — Z992 Dependence on renal dialysis: Secondary | ICD-10-CM | POA: Diagnosis not present

## 2020-12-30 DIAGNOSIS — D509 Iron deficiency anemia, unspecified: Secondary | ICD-10-CM | POA: Diagnosis not present

## 2020-12-30 DIAGNOSIS — E875 Hyperkalemia: Secondary | ICD-10-CM | POA: Diagnosis not present

## 2020-12-30 DIAGNOSIS — N186 End stage renal disease: Secondary | ICD-10-CM | POA: Diagnosis not present

## 2020-12-30 DIAGNOSIS — D631 Anemia in chronic kidney disease: Secondary | ICD-10-CM | POA: Diagnosis not present

## 2021-01-02 DIAGNOSIS — Z992 Dependence on renal dialysis: Secondary | ICD-10-CM | POA: Diagnosis not present

## 2021-01-02 DIAGNOSIS — E875 Hyperkalemia: Secondary | ICD-10-CM | POA: Diagnosis not present

## 2021-01-02 DIAGNOSIS — N2581 Secondary hyperparathyroidism of renal origin: Secondary | ICD-10-CM | POA: Diagnosis not present

## 2021-01-02 DIAGNOSIS — D631 Anemia in chronic kidney disease: Secondary | ICD-10-CM | POA: Diagnosis not present

## 2021-01-02 DIAGNOSIS — D509 Iron deficiency anemia, unspecified: Secondary | ICD-10-CM | POA: Diagnosis not present

## 2021-01-02 DIAGNOSIS — N186 End stage renal disease: Secondary | ICD-10-CM | POA: Diagnosis not present

## 2021-01-04 ENCOUNTER — Other Ambulatory Visit: Payer: Self-pay | Admitting: Internal Medicine

## 2021-01-04 DIAGNOSIS — N2581 Secondary hyperparathyroidism of renal origin: Secondary | ICD-10-CM | POA: Diagnosis not present

## 2021-01-04 DIAGNOSIS — N186 End stage renal disease: Secondary | ICD-10-CM | POA: Diagnosis not present

## 2021-01-04 DIAGNOSIS — Z992 Dependence on renal dialysis: Secondary | ICD-10-CM | POA: Diagnosis not present

## 2021-01-04 DIAGNOSIS — E875 Hyperkalemia: Secondary | ICD-10-CM | POA: Diagnosis not present

## 2021-01-04 DIAGNOSIS — D509 Iron deficiency anemia, unspecified: Secondary | ICD-10-CM | POA: Diagnosis not present

## 2021-01-04 DIAGNOSIS — D631 Anemia in chronic kidney disease: Secondary | ICD-10-CM | POA: Diagnosis not present

## 2021-01-05 DIAGNOSIS — I776 Arteritis, unspecified: Secondary | ICD-10-CM | POA: Diagnosis not present

## 2021-01-05 DIAGNOSIS — M3131 Wegener's granulomatosis with renal involvement: Secondary | ICD-10-CM | POA: Diagnosis not present

## 2021-01-06 DIAGNOSIS — N186 End stage renal disease: Secondary | ICD-10-CM | POA: Diagnosis not present

## 2021-01-06 DIAGNOSIS — E875 Hyperkalemia: Secondary | ICD-10-CM | POA: Diagnosis not present

## 2021-01-06 DIAGNOSIS — N2581 Secondary hyperparathyroidism of renal origin: Secondary | ICD-10-CM | POA: Diagnosis not present

## 2021-01-06 DIAGNOSIS — D631 Anemia in chronic kidney disease: Secondary | ICD-10-CM | POA: Diagnosis not present

## 2021-01-06 DIAGNOSIS — Z992 Dependence on renal dialysis: Secondary | ICD-10-CM | POA: Diagnosis not present

## 2021-01-06 DIAGNOSIS — D509 Iron deficiency anemia, unspecified: Secondary | ICD-10-CM | POA: Diagnosis not present

## 2021-01-09 DIAGNOSIS — D509 Iron deficiency anemia, unspecified: Secondary | ICD-10-CM | POA: Diagnosis not present

## 2021-01-09 DIAGNOSIS — E875 Hyperkalemia: Secondary | ICD-10-CM | POA: Diagnosis not present

## 2021-01-09 DIAGNOSIS — N186 End stage renal disease: Secondary | ICD-10-CM | POA: Diagnosis not present

## 2021-01-09 DIAGNOSIS — D631 Anemia in chronic kidney disease: Secondary | ICD-10-CM | POA: Diagnosis not present

## 2021-01-09 DIAGNOSIS — N2581 Secondary hyperparathyroidism of renal origin: Secondary | ICD-10-CM | POA: Diagnosis not present

## 2021-01-09 DIAGNOSIS — Z992 Dependence on renal dialysis: Secondary | ICD-10-CM | POA: Diagnosis not present

## 2021-01-11 ENCOUNTER — Ambulatory Visit (INDEPENDENT_AMBULATORY_CARE_PROVIDER_SITE_OTHER): Payer: Medicare Other

## 2021-01-11 DIAGNOSIS — E875 Hyperkalemia: Secondary | ICD-10-CM | POA: Diagnosis not present

## 2021-01-11 DIAGNOSIS — N2581 Secondary hyperparathyroidism of renal origin: Secondary | ICD-10-CM | POA: Diagnosis not present

## 2021-01-11 DIAGNOSIS — Z992 Dependence on renal dialysis: Secondary | ICD-10-CM | POA: Diagnosis not present

## 2021-01-11 DIAGNOSIS — N186 End stage renal disease: Secondary | ICD-10-CM | POA: Diagnosis not present

## 2021-01-11 DIAGNOSIS — D631 Anemia in chronic kidney disease: Secondary | ICD-10-CM | POA: Diagnosis not present

## 2021-01-11 DIAGNOSIS — I255 Ischemic cardiomyopathy: Secondary | ICD-10-CM

## 2021-01-11 DIAGNOSIS — D509 Iron deficiency anemia, unspecified: Secondary | ICD-10-CM | POA: Diagnosis not present

## 2021-01-11 LAB — CUP PACEART REMOTE DEVICE CHECK
Battery Remaining Longevity: 90 mo
Battery Voltage: 3 V
Brady Statistic RV Percent Paced: 0 %
Date Time Interrogation Session: 20220317043627
HighPow Impedance: 254 Ohm
HighPow Impedance: 35 Ohm
Implantable Lead Implant Date: 20210315
Implantable Lead Location: 753860
Implantable Lead Model: 6935
Implantable Pulse Generator Implant Date: 20190208
Lead Channel Impedance Value: 304 Ohm
Lead Channel Impedance Value: 361 Ohm
Lead Channel Pacing Threshold Amplitude: 0.75 V
Lead Channel Pacing Threshold Pulse Width: 0.4 ms
Lead Channel Sensing Intrinsic Amplitude: 8.375 mV
Lead Channel Sensing Intrinsic Amplitude: 8.375 mV
Lead Channel Setting Pacing Amplitude: 2 V
Lead Channel Setting Pacing Pulse Width: 0.4 ms
Lead Channel Setting Sensing Sensitivity: 0.3 mV

## 2021-01-13 DIAGNOSIS — E875 Hyperkalemia: Secondary | ICD-10-CM | POA: Diagnosis not present

## 2021-01-13 DIAGNOSIS — N186 End stage renal disease: Secondary | ICD-10-CM | POA: Diagnosis not present

## 2021-01-13 DIAGNOSIS — D509 Iron deficiency anemia, unspecified: Secondary | ICD-10-CM | POA: Diagnosis not present

## 2021-01-13 DIAGNOSIS — N2581 Secondary hyperparathyroidism of renal origin: Secondary | ICD-10-CM | POA: Diagnosis not present

## 2021-01-13 DIAGNOSIS — Z992 Dependence on renal dialysis: Secondary | ICD-10-CM | POA: Diagnosis not present

## 2021-01-13 DIAGNOSIS — D631 Anemia in chronic kidney disease: Secondary | ICD-10-CM | POA: Diagnosis not present

## 2021-01-16 DIAGNOSIS — N2581 Secondary hyperparathyroidism of renal origin: Secondary | ICD-10-CM | POA: Diagnosis not present

## 2021-01-16 DIAGNOSIS — D631 Anemia in chronic kidney disease: Secondary | ICD-10-CM | POA: Diagnosis not present

## 2021-01-16 DIAGNOSIS — E875 Hyperkalemia: Secondary | ICD-10-CM | POA: Diagnosis not present

## 2021-01-16 DIAGNOSIS — N186 End stage renal disease: Secondary | ICD-10-CM | POA: Diagnosis not present

## 2021-01-16 DIAGNOSIS — Z992 Dependence on renal dialysis: Secondary | ICD-10-CM | POA: Diagnosis not present

## 2021-01-16 DIAGNOSIS — D509 Iron deficiency anemia, unspecified: Secondary | ICD-10-CM | POA: Diagnosis not present

## 2021-01-18 DIAGNOSIS — D631 Anemia in chronic kidney disease: Secondary | ICD-10-CM | POA: Diagnosis not present

## 2021-01-18 DIAGNOSIS — N186 End stage renal disease: Secondary | ICD-10-CM | POA: Diagnosis not present

## 2021-01-18 DIAGNOSIS — E875 Hyperkalemia: Secondary | ICD-10-CM | POA: Diagnosis not present

## 2021-01-18 DIAGNOSIS — N2581 Secondary hyperparathyroidism of renal origin: Secondary | ICD-10-CM | POA: Diagnosis not present

## 2021-01-18 DIAGNOSIS — D509 Iron deficiency anemia, unspecified: Secondary | ICD-10-CM | POA: Diagnosis not present

## 2021-01-18 DIAGNOSIS — Z992 Dependence on renal dialysis: Secondary | ICD-10-CM | POA: Diagnosis not present

## 2021-01-19 NOTE — Progress Notes (Signed)
Remote ICD transmission.   

## 2021-01-20 DIAGNOSIS — E875 Hyperkalemia: Secondary | ICD-10-CM | POA: Diagnosis not present

## 2021-01-20 DIAGNOSIS — D631 Anemia in chronic kidney disease: Secondary | ICD-10-CM | POA: Diagnosis not present

## 2021-01-20 DIAGNOSIS — D509 Iron deficiency anemia, unspecified: Secondary | ICD-10-CM | POA: Diagnosis not present

## 2021-01-20 DIAGNOSIS — Z992 Dependence on renal dialysis: Secondary | ICD-10-CM | POA: Diagnosis not present

## 2021-01-20 DIAGNOSIS — N186 End stage renal disease: Secondary | ICD-10-CM | POA: Diagnosis not present

## 2021-01-20 DIAGNOSIS — N2581 Secondary hyperparathyroidism of renal origin: Secondary | ICD-10-CM | POA: Diagnosis not present

## 2021-01-23 DIAGNOSIS — Z992 Dependence on renal dialysis: Secondary | ICD-10-CM | POA: Diagnosis not present

## 2021-01-23 DIAGNOSIS — D509 Iron deficiency anemia, unspecified: Secondary | ICD-10-CM | POA: Diagnosis not present

## 2021-01-23 DIAGNOSIS — N2581 Secondary hyperparathyroidism of renal origin: Secondary | ICD-10-CM | POA: Diagnosis not present

## 2021-01-23 DIAGNOSIS — N186 End stage renal disease: Secondary | ICD-10-CM | POA: Diagnosis not present

## 2021-01-23 DIAGNOSIS — E875 Hyperkalemia: Secondary | ICD-10-CM | POA: Diagnosis not present

## 2021-01-23 DIAGNOSIS — D631 Anemia in chronic kidney disease: Secondary | ICD-10-CM | POA: Diagnosis not present

## 2021-01-25 DIAGNOSIS — N2581 Secondary hyperparathyroidism of renal origin: Secondary | ICD-10-CM | POA: Diagnosis not present

## 2021-01-25 DIAGNOSIS — Z992 Dependence on renal dialysis: Secondary | ICD-10-CM | POA: Diagnosis not present

## 2021-01-25 DIAGNOSIS — D509 Iron deficiency anemia, unspecified: Secondary | ICD-10-CM | POA: Diagnosis not present

## 2021-01-25 DIAGNOSIS — D631 Anemia in chronic kidney disease: Secondary | ICD-10-CM | POA: Diagnosis not present

## 2021-01-25 DIAGNOSIS — E875 Hyperkalemia: Secondary | ICD-10-CM | POA: Diagnosis not present

## 2021-01-25 DIAGNOSIS — N186 End stage renal disease: Secondary | ICD-10-CM | POA: Diagnosis not present

## 2021-01-26 DIAGNOSIS — N032 Chronic nephritic syndrome with diffuse membranous glomerulonephritis: Secondary | ICD-10-CM | POA: Diagnosis not present

## 2021-01-26 DIAGNOSIS — N186 End stage renal disease: Secondary | ICD-10-CM | POA: Diagnosis not present

## 2021-01-26 DIAGNOSIS — Z992 Dependence on renal dialysis: Secondary | ICD-10-CM | POA: Diagnosis not present

## 2021-01-27 DIAGNOSIS — N2581 Secondary hyperparathyroidism of renal origin: Secondary | ICD-10-CM | POA: Diagnosis not present

## 2021-01-27 DIAGNOSIS — E875 Hyperkalemia: Secondary | ICD-10-CM | POA: Diagnosis not present

## 2021-01-27 DIAGNOSIS — N186 End stage renal disease: Secondary | ICD-10-CM | POA: Diagnosis not present

## 2021-01-27 DIAGNOSIS — Z992 Dependence on renal dialysis: Secondary | ICD-10-CM | POA: Diagnosis not present

## 2021-01-27 DIAGNOSIS — D509 Iron deficiency anemia, unspecified: Secondary | ICD-10-CM | POA: Diagnosis not present

## 2021-01-27 DIAGNOSIS — Z23 Encounter for immunization: Secondary | ICD-10-CM | POA: Diagnosis not present

## 2021-01-27 DIAGNOSIS — D631 Anemia in chronic kidney disease: Secondary | ICD-10-CM | POA: Diagnosis not present

## 2021-01-27 DIAGNOSIS — E1122 Type 2 diabetes mellitus with diabetic chronic kidney disease: Secondary | ICD-10-CM | POA: Diagnosis not present

## 2021-01-30 DIAGNOSIS — E875 Hyperkalemia: Secondary | ICD-10-CM | POA: Diagnosis not present

## 2021-01-30 DIAGNOSIS — Z992 Dependence on renal dialysis: Secondary | ICD-10-CM | POA: Diagnosis not present

## 2021-01-30 DIAGNOSIS — D631 Anemia in chronic kidney disease: Secondary | ICD-10-CM | POA: Diagnosis not present

## 2021-01-30 DIAGNOSIS — N2581 Secondary hyperparathyroidism of renal origin: Secondary | ICD-10-CM | POA: Diagnosis not present

## 2021-01-30 DIAGNOSIS — E1122 Type 2 diabetes mellitus with diabetic chronic kidney disease: Secondary | ICD-10-CM | POA: Diagnosis not present

## 2021-01-30 DIAGNOSIS — N186 End stage renal disease: Secondary | ICD-10-CM | POA: Diagnosis not present

## 2021-02-01 DIAGNOSIS — E875 Hyperkalemia: Secondary | ICD-10-CM | POA: Diagnosis not present

## 2021-02-01 DIAGNOSIS — N186 End stage renal disease: Secondary | ICD-10-CM | POA: Diagnosis not present

## 2021-02-01 DIAGNOSIS — Z992 Dependence on renal dialysis: Secondary | ICD-10-CM | POA: Diagnosis not present

## 2021-02-01 DIAGNOSIS — E1122 Type 2 diabetes mellitus with diabetic chronic kidney disease: Secondary | ICD-10-CM | POA: Diagnosis not present

## 2021-02-01 DIAGNOSIS — D631 Anemia in chronic kidney disease: Secondary | ICD-10-CM | POA: Diagnosis not present

## 2021-02-01 DIAGNOSIS — N2581 Secondary hyperparathyroidism of renal origin: Secondary | ICD-10-CM | POA: Diagnosis not present

## 2021-02-03 DIAGNOSIS — E1122 Type 2 diabetes mellitus with diabetic chronic kidney disease: Secondary | ICD-10-CM | POA: Diagnosis not present

## 2021-02-03 DIAGNOSIS — E875 Hyperkalemia: Secondary | ICD-10-CM | POA: Diagnosis not present

## 2021-02-03 DIAGNOSIS — Z992 Dependence on renal dialysis: Secondary | ICD-10-CM | POA: Diagnosis not present

## 2021-02-03 DIAGNOSIS — D631 Anemia in chronic kidney disease: Secondary | ICD-10-CM | POA: Diagnosis not present

## 2021-02-03 DIAGNOSIS — N2581 Secondary hyperparathyroidism of renal origin: Secondary | ICD-10-CM | POA: Diagnosis not present

## 2021-02-03 DIAGNOSIS — N186 End stage renal disease: Secondary | ICD-10-CM | POA: Diagnosis not present

## 2021-02-06 ENCOUNTER — Other Ambulatory Visit: Payer: Self-pay | Admitting: Internal Medicine

## 2021-02-06 DIAGNOSIS — N186 End stage renal disease: Secondary | ICD-10-CM | POA: Diagnosis not present

## 2021-02-06 DIAGNOSIS — Z992 Dependence on renal dialysis: Secondary | ICD-10-CM | POA: Diagnosis not present

## 2021-02-06 DIAGNOSIS — N2581 Secondary hyperparathyroidism of renal origin: Secondary | ICD-10-CM | POA: Diagnosis not present

## 2021-02-06 DIAGNOSIS — D631 Anemia in chronic kidney disease: Secondary | ICD-10-CM | POA: Diagnosis not present

## 2021-02-06 DIAGNOSIS — E1122 Type 2 diabetes mellitus with diabetic chronic kidney disease: Secondary | ICD-10-CM | POA: Diagnosis not present

## 2021-02-06 DIAGNOSIS — E875 Hyperkalemia: Secondary | ICD-10-CM | POA: Diagnosis not present

## 2021-02-07 ENCOUNTER — Other Ambulatory Visit: Payer: Self-pay | Admitting: Internal Medicine

## 2021-02-08 DIAGNOSIS — N186 End stage renal disease: Secondary | ICD-10-CM | POA: Diagnosis not present

## 2021-02-08 DIAGNOSIS — E875 Hyperkalemia: Secondary | ICD-10-CM | POA: Diagnosis not present

## 2021-02-08 DIAGNOSIS — D631 Anemia in chronic kidney disease: Secondary | ICD-10-CM | POA: Diagnosis not present

## 2021-02-08 DIAGNOSIS — E1122 Type 2 diabetes mellitus with diabetic chronic kidney disease: Secondary | ICD-10-CM | POA: Diagnosis not present

## 2021-02-08 DIAGNOSIS — Z992 Dependence on renal dialysis: Secondary | ICD-10-CM | POA: Diagnosis not present

## 2021-02-08 DIAGNOSIS — N2581 Secondary hyperparathyroidism of renal origin: Secondary | ICD-10-CM | POA: Diagnosis not present

## 2021-02-10 DIAGNOSIS — Z992 Dependence on renal dialysis: Secondary | ICD-10-CM | POA: Diagnosis not present

## 2021-02-10 DIAGNOSIS — N2581 Secondary hyperparathyroidism of renal origin: Secondary | ICD-10-CM | POA: Diagnosis not present

## 2021-02-10 DIAGNOSIS — E875 Hyperkalemia: Secondary | ICD-10-CM | POA: Diagnosis not present

## 2021-02-10 DIAGNOSIS — E1122 Type 2 diabetes mellitus with diabetic chronic kidney disease: Secondary | ICD-10-CM | POA: Diagnosis not present

## 2021-02-10 DIAGNOSIS — D631 Anemia in chronic kidney disease: Secondary | ICD-10-CM | POA: Diagnosis not present

## 2021-02-10 DIAGNOSIS — N186 End stage renal disease: Secondary | ICD-10-CM | POA: Diagnosis not present

## 2021-02-13 DIAGNOSIS — E875 Hyperkalemia: Secondary | ICD-10-CM | POA: Diagnosis not present

## 2021-02-13 DIAGNOSIS — N2581 Secondary hyperparathyroidism of renal origin: Secondary | ICD-10-CM | POA: Diagnosis not present

## 2021-02-13 DIAGNOSIS — N186 End stage renal disease: Secondary | ICD-10-CM | POA: Diagnosis not present

## 2021-02-13 DIAGNOSIS — E1122 Type 2 diabetes mellitus with diabetic chronic kidney disease: Secondary | ICD-10-CM | POA: Diagnosis not present

## 2021-02-13 DIAGNOSIS — D631 Anemia in chronic kidney disease: Secondary | ICD-10-CM | POA: Diagnosis not present

## 2021-02-13 DIAGNOSIS — Z992 Dependence on renal dialysis: Secondary | ICD-10-CM | POA: Diagnosis not present

## 2021-02-14 DIAGNOSIS — R053 Chronic cough: Secondary | ICD-10-CM | POA: Diagnosis not present

## 2021-02-14 DIAGNOSIS — Z992 Dependence on renal dialysis: Secondary | ICD-10-CM | POA: Diagnosis not present

## 2021-02-14 DIAGNOSIS — Z7952 Long term (current) use of systemic steroids: Secondary | ICD-10-CM | POA: Diagnosis not present

## 2021-02-14 DIAGNOSIS — Z7951 Long term (current) use of inhaled steroids: Secondary | ICD-10-CM | POA: Diagnosis not present

## 2021-02-14 DIAGNOSIS — Z9889 Other specified postprocedural states: Secondary | ICD-10-CM | POA: Diagnosis not present

## 2021-02-14 DIAGNOSIS — H05229 Edema of unspecified orbit: Secondary | ICD-10-CM | POA: Diagnosis not present

## 2021-02-14 DIAGNOSIS — M3130 Wegener's granulomatosis without renal involvement: Secondary | ICD-10-CM | POA: Diagnosis not present

## 2021-02-14 DIAGNOSIS — M317 Microscopic polyangiitis: Secondary | ICD-10-CM | POA: Diagnosis not present

## 2021-02-14 DIAGNOSIS — Z5181 Encounter for therapeutic drug level monitoring: Secondary | ICD-10-CM | POA: Diagnosis not present

## 2021-02-14 DIAGNOSIS — I776 Arteritis, unspecified: Secondary | ICD-10-CM | POA: Diagnosis not present

## 2021-02-14 DIAGNOSIS — Z79899 Other long term (current) drug therapy: Secondary | ICD-10-CM | POA: Diagnosis not present

## 2021-02-15 DIAGNOSIS — E1122 Type 2 diabetes mellitus with diabetic chronic kidney disease: Secondary | ICD-10-CM | POA: Diagnosis not present

## 2021-02-15 DIAGNOSIS — E875 Hyperkalemia: Secondary | ICD-10-CM | POA: Diagnosis not present

## 2021-02-15 DIAGNOSIS — N2581 Secondary hyperparathyroidism of renal origin: Secondary | ICD-10-CM | POA: Diagnosis not present

## 2021-02-15 DIAGNOSIS — Z992 Dependence on renal dialysis: Secondary | ICD-10-CM | POA: Diagnosis not present

## 2021-02-15 DIAGNOSIS — D631 Anemia in chronic kidney disease: Secondary | ICD-10-CM | POA: Diagnosis not present

## 2021-02-15 DIAGNOSIS — N186 End stage renal disease: Secondary | ICD-10-CM | POA: Diagnosis not present

## 2021-02-17 DIAGNOSIS — N2581 Secondary hyperparathyroidism of renal origin: Secondary | ICD-10-CM | POA: Diagnosis not present

## 2021-02-17 DIAGNOSIS — E1122 Type 2 diabetes mellitus with diabetic chronic kidney disease: Secondary | ICD-10-CM | POA: Diagnosis not present

## 2021-02-17 DIAGNOSIS — N186 End stage renal disease: Secondary | ICD-10-CM | POA: Diagnosis not present

## 2021-02-17 DIAGNOSIS — D631 Anemia in chronic kidney disease: Secondary | ICD-10-CM | POA: Diagnosis not present

## 2021-02-17 DIAGNOSIS — E875 Hyperkalemia: Secondary | ICD-10-CM | POA: Diagnosis not present

## 2021-02-17 DIAGNOSIS — Z992 Dependence on renal dialysis: Secondary | ICD-10-CM | POA: Diagnosis not present

## 2021-02-20 DIAGNOSIS — N186 End stage renal disease: Secondary | ICD-10-CM | POA: Diagnosis not present

## 2021-02-20 DIAGNOSIS — N2581 Secondary hyperparathyroidism of renal origin: Secondary | ICD-10-CM | POA: Diagnosis not present

## 2021-02-20 DIAGNOSIS — Z992 Dependence on renal dialysis: Secondary | ICD-10-CM | POA: Diagnosis not present

## 2021-02-20 DIAGNOSIS — E875 Hyperkalemia: Secondary | ICD-10-CM | POA: Diagnosis not present

## 2021-02-20 DIAGNOSIS — D631 Anemia in chronic kidney disease: Secondary | ICD-10-CM | POA: Diagnosis not present

## 2021-02-20 DIAGNOSIS — E1122 Type 2 diabetes mellitus with diabetic chronic kidney disease: Secondary | ICD-10-CM | POA: Diagnosis not present

## 2021-02-22 DIAGNOSIS — N186 End stage renal disease: Secondary | ICD-10-CM | POA: Diagnosis not present

## 2021-02-22 DIAGNOSIS — Z992 Dependence on renal dialysis: Secondary | ICD-10-CM | POA: Diagnosis not present

## 2021-02-22 DIAGNOSIS — E875 Hyperkalemia: Secondary | ICD-10-CM | POA: Diagnosis not present

## 2021-02-22 DIAGNOSIS — E1122 Type 2 diabetes mellitus with diabetic chronic kidney disease: Secondary | ICD-10-CM | POA: Diagnosis not present

## 2021-02-22 DIAGNOSIS — N2581 Secondary hyperparathyroidism of renal origin: Secondary | ICD-10-CM | POA: Diagnosis not present

## 2021-02-22 DIAGNOSIS — D631 Anemia in chronic kidney disease: Secondary | ICD-10-CM | POA: Diagnosis not present

## 2021-02-24 DIAGNOSIS — D631 Anemia in chronic kidney disease: Secondary | ICD-10-CM | POA: Diagnosis not present

## 2021-02-24 DIAGNOSIS — Z992 Dependence on renal dialysis: Secondary | ICD-10-CM | POA: Diagnosis not present

## 2021-02-24 DIAGNOSIS — N2581 Secondary hyperparathyroidism of renal origin: Secondary | ICD-10-CM | POA: Diagnosis not present

## 2021-02-24 DIAGNOSIS — N186 End stage renal disease: Secondary | ICD-10-CM | POA: Diagnosis not present

## 2021-02-24 DIAGNOSIS — E1122 Type 2 diabetes mellitus with diabetic chronic kidney disease: Secondary | ICD-10-CM | POA: Diagnosis not present

## 2021-02-24 DIAGNOSIS — E875 Hyperkalemia: Secondary | ICD-10-CM | POA: Diagnosis not present

## 2021-02-25 DIAGNOSIS — Z992 Dependence on renal dialysis: Secondary | ICD-10-CM | POA: Diagnosis not present

## 2021-02-25 DIAGNOSIS — N032 Chronic nephritic syndrome with diffuse membranous glomerulonephritis: Secondary | ICD-10-CM | POA: Diagnosis not present

## 2021-02-25 DIAGNOSIS — I871 Compression of vein: Secondary | ICD-10-CM | POA: Diagnosis not present

## 2021-02-25 DIAGNOSIS — N186 End stage renal disease: Secondary | ICD-10-CM | POA: Diagnosis not present

## 2021-02-25 DIAGNOSIS — T82858A Stenosis of vascular prosthetic devices, implants and grafts, initial encounter: Secondary | ICD-10-CM | POA: Diagnosis not present

## 2021-02-26 DIAGNOSIS — I871 Compression of vein: Secondary | ICD-10-CM | POA: Diagnosis not present

## 2021-02-26 DIAGNOSIS — T82858A Stenosis of vascular prosthetic devices, implants and grafts, initial encounter: Secondary | ICD-10-CM | POA: Diagnosis not present

## 2021-02-26 DIAGNOSIS — N186 End stage renal disease: Secondary | ICD-10-CM | POA: Diagnosis not present

## 2021-02-26 DIAGNOSIS — Z992 Dependence on renal dialysis: Secondary | ICD-10-CM | POA: Diagnosis not present

## 2021-02-27 DIAGNOSIS — D631 Anemia in chronic kidney disease: Secondary | ICD-10-CM | POA: Diagnosis not present

## 2021-02-27 DIAGNOSIS — E1122 Type 2 diabetes mellitus with diabetic chronic kidney disease: Secondary | ICD-10-CM | POA: Diagnosis not present

## 2021-02-27 DIAGNOSIS — N186 End stage renal disease: Secondary | ICD-10-CM | POA: Diagnosis not present

## 2021-02-27 DIAGNOSIS — N2581 Secondary hyperparathyroidism of renal origin: Secondary | ICD-10-CM | POA: Diagnosis not present

## 2021-02-27 DIAGNOSIS — E875 Hyperkalemia: Secondary | ICD-10-CM | POA: Diagnosis not present

## 2021-02-27 DIAGNOSIS — D509 Iron deficiency anemia, unspecified: Secondary | ICD-10-CM | POA: Diagnosis not present

## 2021-02-27 DIAGNOSIS — Z992 Dependence on renal dialysis: Secondary | ICD-10-CM | POA: Diagnosis not present

## 2021-03-01 DIAGNOSIS — E1122 Type 2 diabetes mellitus with diabetic chronic kidney disease: Secondary | ICD-10-CM | POA: Diagnosis not present

## 2021-03-01 DIAGNOSIS — D631 Anemia in chronic kidney disease: Secondary | ICD-10-CM | POA: Diagnosis not present

## 2021-03-01 DIAGNOSIS — E875 Hyperkalemia: Secondary | ICD-10-CM | POA: Diagnosis not present

## 2021-03-01 DIAGNOSIS — N2581 Secondary hyperparathyroidism of renal origin: Secondary | ICD-10-CM | POA: Diagnosis not present

## 2021-03-01 DIAGNOSIS — N186 End stage renal disease: Secondary | ICD-10-CM | POA: Diagnosis not present

## 2021-03-01 DIAGNOSIS — Z992 Dependence on renal dialysis: Secondary | ICD-10-CM | POA: Diagnosis not present

## 2021-03-03 DIAGNOSIS — N186 End stage renal disease: Secondary | ICD-10-CM | POA: Diagnosis not present

## 2021-03-03 DIAGNOSIS — Z992 Dependence on renal dialysis: Secondary | ICD-10-CM | POA: Diagnosis not present

## 2021-03-03 DIAGNOSIS — D631 Anemia in chronic kidney disease: Secondary | ICD-10-CM | POA: Diagnosis not present

## 2021-03-03 DIAGNOSIS — E1122 Type 2 diabetes mellitus with diabetic chronic kidney disease: Secondary | ICD-10-CM | POA: Diagnosis not present

## 2021-03-03 DIAGNOSIS — N2581 Secondary hyperparathyroidism of renal origin: Secondary | ICD-10-CM | POA: Diagnosis not present

## 2021-03-03 DIAGNOSIS — E875 Hyperkalemia: Secondary | ICD-10-CM | POA: Diagnosis not present

## 2021-03-06 DIAGNOSIS — E875 Hyperkalemia: Secondary | ICD-10-CM | POA: Diagnosis not present

## 2021-03-06 DIAGNOSIS — D631 Anemia in chronic kidney disease: Secondary | ICD-10-CM | POA: Diagnosis not present

## 2021-03-06 DIAGNOSIS — E1122 Type 2 diabetes mellitus with diabetic chronic kidney disease: Secondary | ICD-10-CM | POA: Diagnosis not present

## 2021-03-06 DIAGNOSIS — N2581 Secondary hyperparathyroidism of renal origin: Secondary | ICD-10-CM | POA: Diagnosis not present

## 2021-03-06 DIAGNOSIS — N186 End stage renal disease: Secondary | ICD-10-CM | POA: Diagnosis not present

## 2021-03-06 DIAGNOSIS — Z992 Dependence on renal dialysis: Secondary | ICD-10-CM | POA: Diagnosis not present

## 2021-03-08 DIAGNOSIS — Z992 Dependence on renal dialysis: Secondary | ICD-10-CM | POA: Diagnosis not present

## 2021-03-08 DIAGNOSIS — E1122 Type 2 diabetes mellitus with diabetic chronic kidney disease: Secondary | ICD-10-CM | POA: Diagnosis not present

## 2021-03-08 DIAGNOSIS — D631 Anemia in chronic kidney disease: Secondary | ICD-10-CM | POA: Diagnosis not present

## 2021-03-08 DIAGNOSIS — E875 Hyperkalemia: Secondary | ICD-10-CM | POA: Diagnosis not present

## 2021-03-08 DIAGNOSIS — N186 End stage renal disease: Secondary | ICD-10-CM | POA: Diagnosis not present

## 2021-03-08 DIAGNOSIS — N2581 Secondary hyperparathyroidism of renal origin: Secondary | ICD-10-CM | POA: Diagnosis not present

## 2021-03-10 DIAGNOSIS — E875 Hyperkalemia: Secondary | ICD-10-CM | POA: Diagnosis not present

## 2021-03-10 DIAGNOSIS — N186 End stage renal disease: Secondary | ICD-10-CM | POA: Diagnosis not present

## 2021-03-10 DIAGNOSIS — Z992 Dependence on renal dialysis: Secondary | ICD-10-CM | POA: Diagnosis not present

## 2021-03-10 DIAGNOSIS — E1122 Type 2 diabetes mellitus with diabetic chronic kidney disease: Secondary | ICD-10-CM | POA: Diagnosis not present

## 2021-03-10 DIAGNOSIS — N2581 Secondary hyperparathyroidism of renal origin: Secondary | ICD-10-CM | POA: Diagnosis not present

## 2021-03-10 DIAGNOSIS — D631 Anemia in chronic kidney disease: Secondary | ICD-10-CM | POA: Diagnosis not present

## 2021-03-13 DIAGNOSIS — N186 End stage renal disease: Secondary | ICD-10-CM | POA: Diagnosis not present

## 2021-03-13 DIAGNOSIS — Z992 Dependence on renal dialysis: Secondary | ICD-10-CM | POA: Diagnosis not present

## 2021-03-13 DIAGNOSIS — D631 Anemia in chronic kidney disease: Secondary | ICD-10-CM | POA: Diagnosis not present

## 2021-03-13 DIAGNOSIS — E1122 Type 2 diabetes mellitus with diabetic chronic kidney disease: Secondary | ICD-10-CM | POA: Diagnosis not present

## 2021-03-13 DIAGNOSIS — N2581 Secondary hyperparathyroidism of renal origin: Secondary | ICD-10-CM | POA: Diagnosis not present

## 2021-03-13 DIAGNOSIS — E875 Hyperkalemia: Secondary | ICD-10-CM | POA: Diagnosis not present

## 2021-03-15 DIAGNOSIS — E875 Hyperkalemia: Secondary | ICD-10-CM | POA: Diagnosis not present

## 2021-03-15 DIAGNOSIS — Z992 Dependence on renal dialysis: Secondary | ICD-10-CM | POA: Diagnosis not present

## 2021-03-15 DIAGNOSIS — E1122 Type 2 diabetes mellitus with diabetic chronic kidney disease: Secondary | ICD-10-CM | POA: Diagnosis not present

## 2021-03-15 DIAGNOSIS — N186 End stage renal disease: Secondary | ICD-10-CM | POA: Diagnosis not present

## 2021-03-15 DIAGNOSIS — N2581 Secondary hyperparathyroidism of renal origin: Secondary | ICD-10-CM | POA: Diagnosis not present

## 2021-03-15 DIAGNOSIS — D631 Anemia in chronic kidney disease: Secondary | ICD-10-CM | POA: Diagnosis not present

## 2021-03-17 DIAGNOSIS — N186 End stage renal disease: Secondary | ICD-10-CM | POA: Diagnosis not present

## 2021-03-17 DIAGNOSIS — E1122 Type 2 diabetes mellitus with diabetic chronic kidney disease: Secondary | ICD-10-CM | POA: Diagnosis not present

## 2021-03-17 DIAGNOSIS — E875 Hyperkalemia: Secondary | ICD-10-CM | POA: Diagnosis not present

## 2021-03-17 DIAGNOSIS — Z992 Dependence on renal dialysis: Secondary | ICD-10-CM | POA: Diagnosis not present

## 2021-03-17 DIAGNOSIS — D631 Anemia in chronic kidney disease: Secondary | ICD-10-CM | POA: Diagnosis not present

## 2021-03-17 DIAGNOSIS — N2581 Secondary hyperparathyroidism of renal origin: Secondary | ICD-10-CM | POA: Diagnosis not present

## 2021-03-20 DIAGNOSIS — E875 Hyperkalemia: Secondary | ICD-10-CM | POA: Diagnosis not present

## 2021-03-20 DIAGNOSIS — D631 Anemia in chronic kidney disease: Secondary | ICD-10-CM | POA: Diagnosis not present

## 2021-03-20 DIAGNOSIS — Z992 Dependence on renal dialysis: Secondary | ICD-10-CM | POA: Diagnosis not present

## 2021-03-20 DIAGNOSIS — E1122 Type 2 diabetes mellitus with diabetic chronic kidney disease: Secondary | ICD-10-CM | POA: Diagnosis not present

## 2021-03-20 DIAGNOSIS — N2581 Secondary hyperparathyroidism of renal origin: Secondary | ICD-10-CM | POA: Diagnosis not present

## 2021-03-20 DIAGNOSIS — N186 End stage renal disease: Secondary | ICD-10-CM | POA: Diagnosis not present

## 2021-03-22 DIAGNOSIS — Z992 Dependence on renal dialysis: Secondary | ICD-10-CM | POA: Diagnosis not present

## 2021-03-22 DIAGNOSIS — D631 Anemia in chronic kidney disease: Secondary | ICD-10-CM | POA: Diagnosis not present

## 2021-03-22 DIAGNOSIS — E875 Hyperkalemia: Secondary | ICD-10-CM | POA: Diagnosis not present

## 2021-03-22 DIAGNOSIS — N186 End stage renal disease: Secondary | ICD-10-CM | POA: Diagnosis not present

## 2021-03-22 DIAGNOSIS — E1122 Type 2 diabetes mellitus with diabetic chronic kidney disease: Secondary | ICD-10-CM | POA: Diagnosis not present

## 2021-03-22 DIAGNOSIS — N2581 Secondary hyperparathyroidism of renal origin: Secondary | ICD-10-CM | POA: Diagnosis not present

## 2021-03-24 DIAGNOSIS — D631 Anemia in chronic kidney disease: Secondary | ICD-10-CM | POA: Diagnosis not present

## 2021-03-24 DIAGNOSIS — N2581 Secondary hyperparathyroidism of renal origin: Secondary | ICD-10-CM | POA: Diagnosis not present

## 2021-03-24 DIAGNOSIS — N186 End stage renal disease: Secondary | ICD-10-CM | POA: Diagnosis not present

## 2021-03-24 DIAGNOSIS — Z992 Dependence on renal dialysis: Secondary | ICD-10-CM | POA: Diagnosis not present

## 2021-03-24 DIAGNOSIS — E875 Hyperkalemia: Secondary | ICD-10-CM | POA: Diagnosis not present

## 2021-03-24 DIAGNOSIS — E1122 Type 2 diabetes mellitus with diabetic chronic kidney disease: Secondary | ICD-10-CM | POA: Diagnosis not present

## 2021-03-27 DIAGNOSIS — E875 Hyperkalemia: Secondary | ICD-10-CM | POA: Diagnosis not present

## 2021-03-27 DIAGNOSIS — D631 Anemia in chronic kidney disease: Secondary | ICD-10-CM | POA: Diagnosis not present

## 2021-03-27 DIAGNOSIS — E1122 Type 2 diabetes mellitus with diabetic chronic kidney disease: Secondary | ICD-10-CM | POA: Diagnosis not present

## 2021-03-27 DIAGNOSIS — N186 End stage renal disease: Secondary | ICD-10-CM | POA: Diagnosis not present

## 2021-03-27 DIAGNOSIS — Z992 Dependence on renal dialysis: Secondary | ICD-10-CM | POA: Diagnosis not present

## 2021-03-27 DIAGNOSIS — N2581 Secondary hyperparathyroidism of renal origin: Secondary | ICD-10-CM | POA: Diagnosis not present

## 2021-03-28 DIAGNOSIS — Z992 Dependence on renal dialysis: Secondary | ICD-10-CM | POA: Diagnosis not present

## 2021-03-28 DIAGNOSIS — N032 Chronic nephritic syndrome with diffuse membranous glomerulonephritis: Secondary | ICD-10-CM | POA: Diagnosis not present

## 2021-03-28 DIAGNOSIS — N186 End stage renal disease: Secondary | ICD-10-CM | POA: Diagnosis not present

## 2021-03-29 DIAGNOSIS — E875 Hyperkalemia: Secondary | ICD-10-CM | POA: Diagnosis not present

## 2021-03-29 DIAGNOSIS — N186 End stage renal disease: Secondary | ICD-10-CM | POA: Diagnosis not present

## 2021-03-29 DIAGNOSIS — D631 Anemia in chronic kidney disease: Secondary | ICD-10-CM | POA: Diagnosis not present

## 2021-03-29 DIAGNOSIS — Z992 Dependence on renal dialysis: Secondary | ICD-10-CM | POA: Diagnosis not present

## 2021-03-29 DIAGNOSIS — E1122 Type 2 diabetes mellitus with diabetic chronic kidney disease: Secondary | ICD-10-CM | POA: Diagnosis not present

## 2021-03-29 DIAGNOSIS — N2581 Secondary hyperparathyroidism of renal origin: Secondary | ICD-10-CM | POA: Diagnosis not present

## 2021-03-29 DIAGNOSIS — Z23 Encounter for immunization: Secondary | ICD-10-CM | POA: Diagnosis not present

## 2021-03-29 DIAGNOSIS — D509 Iron deficiency anemia, unspecified: Secondary | ICD-10-CM | POA: Diagnosis not present

## 2021-03-31 DIAGNOSIS — E875 Hyperkalemia: Secondary | ICD-10-CM | POA: Diagnosis not present

## 2021-03-31 DIAGNOSIS — N2581 Secondary hyperparathyroidism of renal origin: Secondary | ICD-10-CM | POA: Diagnosis not present

## 2021-03-31 DIAGNOSIS — Z992 Dependence on renal dialysis: Secondary | ICD-10-CM | POA: Diagnosis not present

## 2021-03-31 DIAGNOSIS — D631 Anemia in chronic kidney disease: Secondary | ICD-10-CM | POA: Diagnosis not present

## 2021-03-31 DIAGNOSIS — N186 End stage renal disease: Secondary | ICD-10-CM | POA: Diagnosis not present

## 2021-03-31 DIAGNOSIS — D509 Iron deficiency anemia, unspecified: Secondary | ICD-10-CM | POA: Diagnosis not present

## 2021-04-03 DIAGNOSIS — N186 End stage renal disease: Secondary | ICD-10-CM | POA: Diagnosis not present

## 2021-04-03 DIAGNOSIS — D631 Anemia in chronic kidney disease: Secondary | ICD-10-CM | POA: Diagnosis not present

## 2021-04-03 DIAGNOSIS — D509 Iron deficiency anemia, unspecified: Secondary | ICD-10-CM | POA: Diagnosis not present

## 2021-04-03 DIAGNOSIS — N2581 Secondary hyperparathyroidism of renal origin: Secondary | ICD-10-CM | POA: Diagnosis not present

## 2021-04-03 DIAGNOSIS — Z992 Dependence on renal dialysis: Secondary | ICD-10-CM | POA: Diagnosis not present

## 2021-04-03 DIAGNOSIS — E875 Hyperkalemia: Secondary | ICD-10-CM | POA: Diagnosis not present

## 2021-04-05 DIAGNOSIS — N186 End stage renal disease: Secondary | ICD-10-CM | POA: Diagnosis not present

## 2021-04-05 DIAGNOSIS — D509 Iron deficiency anemia, unspecified: Secondary | ICD-10-CM | POA: Diagnosis not present

## 2021-04-05 DIAGNOSIS — N2581 Secondary hyperparathyroidism of renal origin: Secondary | ICD-10-CM | POA: Diagnosis not present

## 2021-04-05 DIAGNOSIS — D631 Anemia in chronic kidney disease: Secondary | ICD-10-CM | POA: Diagnosis not present

## 2021-04-05 DIAGNOSIS — Z992 Dependence on renal dialysis: Secondary | ICD-10-CM | POA: Diagnosis not present

## 2021-04-05 DIAGNOSIS — E875 Hyperkalemia: Secondary | ICD-10-CM | POA: Diagnosis not present

## 2021-04-07 DIAGNOSIS — E875 Hyperkalemia: Secondary | ICD-10-CM | POA: Diagnosis not present

## 2021-04-07 DIAGNOSIS — Z992 Dependence on renal dialysis: Secondary | ICD-10-CM | POA: Diagnosis not present

## 2021-04-07 DIAGNOSIS — N2581 Secondary hyperparathyroidism of renal origin: Secondary | ICD-10-CM | POA: Diagnosis not present

## 2021-04-07 DIAGNOSIS — N186 End stage renal disease: Secondary | ICD-10-CM | POA: Diagnosis not present

## 2021-04-07 DIAGNOSIS — D631 Anemia in chronic kidney disease: Secondary | ICD-10-CM | POA: Diagnosis not present

## 2021-04-07 DIAGNOSIS — D509 Iron deficiency anemia, unspecified: Secondary | ICD-10-CM | POA: Diagnosis not present

## 2021-04-10 DIAGNOSIS — E875 Hyperkalemia: Secondary | ICD-10-CM | POA: Diagnosis not present

## 2021-04-10 DIAGNOSIS — N186 End stage renal disease: Secondary | ICD-10-CM | POA: Diagnosis not present

## 2021-04-10 DIAGNOSIS — D631 Anemia in chronic kidney disease: Secondary | ICD-10-CM | POA: Diagnosis not present

## 2021-04-10 DIAGNOSIS — D509 Iron deficiency anemia, unspecified: Secondary | ICD-10-CM | POA: Diagnosis not present

## 2021-04-10 DIAGNOSIS — N2581 Secondary hyperparathyroidism of renal origin: Secondary | ICD-10-CM | POA: Diagnosis not present

## 2021-04-10 DIAGNOSIS — Z992 Dependence on renal dialysis: Secondary | ICD-10-CM | POA: Diagnosis not present

## 2021-04-12 ENCOUNTER — Ambulatory Visit (INDEPENDENT_AMBULATORY_CARE_PROVIDER_SITE_OTHER): Payer: Medicare Other

## 2021-04-12 DIAGNOSIS — N2581 Secondary hyperparathyroidism of renal origin: Secondary | ICD-10-CM | POA: Diagnosis not present

## 2021-04-12 DIAGNOSIS — D631 Anemia in chronic kidney disease: Secondary | ICD-10-CM | POA: Diagnosis not present

## 2021-04-12 DIAGNOSIS — N186 End stage renal disease: Secondary | ICD-10-CM | POA: Diagnosis not present

## 2021-04-12 DIAGNOSIS — I255 Ischemic cardiomyopathy: Secondary | ICD-10-CM

## 2021-04-12 DIAGNOSIS — D509 Iron deficiency anemia, unspecified: Secondary | ICD-10-CM | POA: Diagnosis not present

## 2021-04-12 DIAGNOSIS — Z992 Dependence on renal dialysis: Secondary | ICD-10-CM | POA: Diagnosis not present

## 2021-04-12 DIAGNOSIS — E875 Hyperkalemia: Secondary | ICD-10-CM | POA: Diagnosis not present

## 2021-04-13 LAB — CUP PACEART REMOTE DEVICE CHECK
Battery Remaining Longevity: 92 mo
Battery Voltage: 2.99 V
Brady Statistic RV Percent Paced: 0.01 %
Date Time Interrogation Session: 20220616162208
HighPow Impedance: 254 Ohm
HighPow Impedance: 41 Ohm
Implantable Lead Implant Date: 20210315
Implantable Lead Location: 753860
Implantable Lead Model: 6935
Implantable Pulse Generator Implant Date: 20190208
Lead Channel Impedance Value: 285 Ohm
Lead Channel Impedance Value: 399 Ohm
Lead Channel Pacing Threshold Amplitude: 0.75 V
Lead Channel Pacing Threshold Pulse Width: 0.4 ms
Lead Channel Sensing Intrinsic Amplitude: 8.125 mV
Lead Channel Sensing Intrinsic Amplitude: 8.125 mV
Lead Channel Setting Pacing Amplitude: 2 V
Lead Channel Setting Pacing Pulse Width: 0.4 ms
Lead Channel Setting Sensing Sensitivity: 0.3 mV

## 2021-04-14 DIAGNOSIS — Z992 Dependence on renal dialysis: Secondary | ICD-10-CM | POA: Diagnosis not present

## 2021-04-14 DIAGNOSIS — E875 Hyperkalemia: Secondary | ICD-10-CM | POA: Diagnosis not present

## 2021-04-14 DIAGNOSIS — N186 End stage renal disease: Secondary | ICD-10-CM | POA: Diagnosis not present

## 2021-04-14 DIAGNOSIS — D631 Anemia in chronic kidney disease: Secondary | ICD-10-CM | POA: Diagnosis not present

## 2021-04-14 DIAGNOSIS — N2581 Secondary hyperparathyroidism of renal origin: Secondary | ICD-10-CM | POA: Diagnosis not present

## 2021-04-14 DIAGNOSIS — D509 Iron deficiency anemia, unspecified: Secondary | ICD-10-CM | POA: Diagnosis not present

## 2021-04-17 DIAGNOSIS — D631 Anemia in chronic kidney disease: Secondary | ICD-10-CM | POA: Diagnosis not present

## 2021-04-17 DIAGNOSIS — N186 End stage renal disease: Secondary | ICD-10-CM | POA: Diagnosis not present

## 2021-04-17 DIAGNOSIS — D509 Iron deficiency anemia, unspecified: Secondary | ICD-10-CM | POA: Diagnosis not present

## 2021-04-17 DIAGNOSIS — N2581 Secondary hyperparathyroidism of renal origin: Secondary | ICD-10-CM | POA: Diagnosis not present

## 2021-04-17 DIAGNOSIS — Z992 Dependence on renal dialysis: Secondary | ICD-10-CM | POA: Diagnosis not present

## 2021-04-17 DIAGNOSIS — E875 Hyperkalemia: Secondary | ICD-10-CM | POA: Diagnosis not present

## 2021-04-19 DIAGNOSIS — N2581 Secondary hyperparathyroidism of renal origin: Secondary | ICD-10-CM | POA: Diagnosis not present

## 2021-04-19 DIAGNOSIS — D509 Iron deficiency anemia, unspecified: Secondary | ICD-10-CM | POA: Diagnosis not present

## 2021-04-19 DIAGNOSIS — E875 Hyperkalemia: Secondary | ICD-10-CM | POA: Diagnosis not present

## 2021-04-19 DIAGNOSIS — N186 End stage renal disease: Secondary | ICD-10-CM | POA: Diagnosis not present

## 2021-04-19 DIAGNOSIS — Z992 Dependence on renal dialysis: Secondary | ICD-10-CM | POA: Diagnosis not present

## 2021-04-19 DIAGNOSIS — D631 Anemia in chronic kidney disease: Secondary | ICD-10-CM | POA: Diagnosis not present

## 2021-04-20 DIAGNOSIS — Z95810 Presence of automatic (implantable) cardiac defibrillator: Secondary | ICD-10-CM | POA: Diagnosis not present

## 2021-04-20 DIAGNOSIS — I452 Bifascicular block: Secondary | ICD-10-CM | POA: Diagnosis not present

## 2021-04-20 DIAGNOSIS — Z992 Dependence on renal dialysis: Secondary | ICD-10-CM | POA: Diagnosis not present

## 2021-04-20 DIAGNOSIS — N189 Chronic kidney disease, unspecified: Secondary | ICD-10-CM | POA: Diagnosis not present

## 2021-04-20 DIAGNOSIS — M79602 Pain in left arm: Secondary | ICD-10-CM | POA: Diagnosis not present

## 2021-04-20 DIAGNOSIS — M79603 Pain in arm, unspecified: Secondary | ICD-10-CM | POA: Diagnosis not present

## 2021-04-20 DIAGNOSIS — I120 Hypertensive chronic kidney disease with stage 5 chronic kidney disease or end stage renal disease: Secondary | ICD-10-CM | POA: Diagnosis not present

## 2021-04-20 DIAGNOSIS — I129 Hypertensive chronic kidney disease with stage 1 through stage 4 chronic kidney disease, or unspecified chronic kidney disease: Secondary | ICD-10-CM | POA: Diagnosis not present

## 2021-04-20 DIAGNOSIS — M79622 Pain in left upper arm: Secondary | ICD-10-CM | POA: Diagnosis not present

## 2021-04-20 DIAGNOSIS — E7800 Pure hypercholesterolemia, unspecified: Secondary | ICD-10-CM | POA: Diagnosis not present

## 2021-04-20 DIAGNOSIS — R531 Weakness: Secondary | ICD-10-CM | POA: Diagnosis not present

## 2021-04-20 DIAGNOSIS — N186 End stage renal disease: Secondary | ICD-10-CM | POA: Diagnosis not present

## 2021-04-20 DIAGNOSIS — Z79899 Other long term (current) drug therapy: Secondary | ICD-10-CM | POA: Diagnosis not present

## 2021-04-20 DIAGNOSIS — R9431 Abnormal electrocardiogram [ECG] [EKG]: Secondary | ICD-10-CM | POA: Diagnosis not present

## 2021-04-21 DIAGNOSIS — M79602 Pain in left arm: Secondary | ICD-10-CM | POA: Diagnosis not present

## 2021-04-21 DIAGNOSIS — D509 Iron deficiency anemia, unspecified: Secondary | ICD-10-CM | POA: Diagnosis not present

## 2021-04-21 DIAGNOSIS — E875 Hyperkalemia: Secondary | ICD-10-CM | POA: Diagnosis not present

## 2021-04-21 DIAGNOSIS — Z992 Dependence on renal dialysis: Secondary | ICD-10-CM | POA: Diagnosis not present

## 2021-04-21 DIAGNOSIS — N186 End stage renal disease: Secondary | ICD-10-CM | POA: Diagnosis not present

## 2021-04-21 DIAGNOSIS — N2581 Secondary hyperparathyroidism of renal origin: Secondary | ICD-10-CM | POA: Diagnosis not present

## 2021-04-21 DIAGNOSIS — D631 Anemia in chronic kidney disease: Secondary | ICD-10-CM | POA: Diagnosis not present

## 2021-04-21 DIAGNOSIS — I120 Hypertensive chronic kidney disease with stage 5 chronic kidney disease or end stage renal disease: Secondary | ICD-10-CM | POA: Diagnosis not present

## 2021-04-24 DIAGNOSIS — Z992 Dependence on renal dialysis: Secondary | ICD-10-CM | POA: Diagnosis not present

## 2021-04-24 DIAGNOSIS — E875 Hyperkalemia: Secondary | ICD-10-CM | POA: Diagnosis not present

## 2021-04-24 DIAGNOSIS — N186 End stage renal disease: Secondary | ICD-10-CM | POA: Diagnosis not present

## 2021-04-24 DIAGNOSIS — N2581 Secondary hyperparathyroidism of renal origin: Secondary | ICD-10-CM | POA: Diagnosis not present

## 2021-04-24 DIAGNOSIS — D631 Anemia in chronic kidney disease: Secondary | ICD-10-CM | POA: Diagnosis not present

## 2021-04-24 DIAGNOSIS — D509 Iron deficiency anemia, unspecified: Secondary | ICD-10-CM | POA: Diagnosis not present

## 2021-04-26 DIAGNOSIS — N2581 Secondary hyperparathyroidism of renal origin: Secondary | ICD-10-CM | POA: Diagnosis not present

## 2021-04-26 DIAGNOSIS — E875 Hyperkalemia: Secondary | ICD-10-CM | POA: Diagnosis not present

## 2021-04-26 DIAGNOSIS — Z992 Dependence on renal dialysis: Secondary | ICD-10-CM | POA: Diagnosis not present

## 2021-04-26 DIAGNOSIS — D631 Anemia in chronic kidney disease: Secondary | ICD-10-CM | POA: Diagnosis not present

## 2021-04-26 DIAGNOSIS — D509 Iron deficiency anemia, unspecified: Secondary | ICD-10-CM | POA: Diagnosis not present

## 2021-04-26 DIAGNOSIS — N186 End stage renal disease: Secondary | ICD-10-CM | POA: Diagnosis not present

## 2021-04-27 DIAGNOSIS — Z992 Dependence on renal dialysis: Secondary | ICD-10-CM | POA: Diagnosis not present

## 2021-04-27 DIAGNOSIS — N032 Chronic nephritic syndrome with diffuse membranous glomerulonephritis: Secondary | ICD-10-CM | POA: Diagnosis not present

## 2021-04-27 DIAGNOSIS — N186 End stage renal disease: Secondary | ICD-10-CM | POA: Diagnosis not present

## 2021-04-28 DIAGNOSIS — D509 Iron deficiency anemia, unspecified: Secondary | ICD-10-CM | POA: Diagnosis not present

## 2021-04-28 DIAGNOSIS — N186 End stage renal disease: Secondary | ICD-10-CM | POA: Diagnosis not present

## 2021-04-28 DIAGNOSIS — E875 Hyperkalemia: Secondary | ICD-10-CM | POA: Diagnosis not present

## 2021-04-28 DIAGNOSIS — E1122 Type 2 diabetes mellitus with diabetic chronic kidney disease: Secondary | ICD-10-CM | POA: Diagnosis not present

## 2021-04-28 DIAGNOSIS — N2581 Secondary hyperparathyroidism of renal origin: Secondary | ICD-10-CM | POA: Diagnosis not present

## 2021-04-28 DIAGNOSIS — D631 Anemia in chronic kidney disease: Secondary | ICD-10-CM | POA: Diagnosis not present

## 2021-04-28 DIAGNOSIS — Z992 Dependence on renal dialysis: Secondary | ICD-10-CM | POA: Diagnosis not present

## 2021-04-30 DIAGNOSIS — Z20822 Contact with and (suspected) exposure to covid-19: Secondary | ICD-10-CM | POA: Diagnosis not present

## 2021-05-01 DIAGNOSIS — E875 Hyperkalemia: Secondary | ICD-10-CM | POA: Diagnosis not present

## 2021-05-01 DIAGNOSIS — D631 Anemia in chronic kidney disease: Secondary | ICD-10-CM | POA: Diagnosis not present

## 2021-05-01 DIAGNOSIS — N2581 Secondary hyperparathyroidism of renal origin: Secondary | ICD-10-CM | POA: Diagnosis not present

## 2021-05-01 DIAGNOSIS — N186 End stage renal disease: Secondary | ICD-10-CM | POA: Diagnosis not present

## 2021-05-01 DIAGNOSIS — D509 Iron deficiency anemia, unspecified: Secondary | ICD-10-CM | POA: Diagnosis not present

## 2021-05-01 DIAGNOSIS — Z992 Dependence on renal dialysis: Secondary | ICD-10-CM | POA: Diagnosis not present

## 2021-05-03 DIAGNOSIS — D509 Iron deficiency anemia, unspecified: Secondary | ICD-10-CM | POA: Diagnosis not present

## 2021-05-03 DIAGNOSIS — N186 End stage renal disease: Secondary | ICD-10-CM | POA: Diagnosis not present

## 2021-05-03 DIAGNOSIS — Z992 Dependence on renal dialysis: Secondary | ICD-10-CM | POA: Diagnosis not present

## 2021-05-03 DIAGNOSIS — N2581 Secondary hyperparathyroidism of renal origin: Secondary | ICD-10-CM | POA: Diagnosis not present

## 2021-05-03 DIAGNOSIS — E875 Hyperkalemia: Secondary | ICD-10-CM | POA: Diagnosis not present

## 2021-05-03 DIAGNOSIS — D631 Anemia in chronic kidney disease: Secondary | ICD-10-CM | POA: Diagnosis not present

## 2021-05-03 NOTE — Progress Notes (Signed)
Remote ICD transmission.   

## 2021-05-05 DIAGNOSIS — D509 Iron deficiency anemia, unspecified: Secondary | ICD-10-CM | POA: Diagnosis not present

## 2021-05-05 DIAGNOSIS — Z992 Dependence on renal dialysis: Secondary | ICD-10-CM | POA: Diagnosis not present

## 2021-05-05 DIAGNOSIS — D631 Anemia in chronic kidney disease: Secondary | ICD-10-CM | POA: Diagnosis not present

## 2021-05-05 DIAGNOSIS — E875 Hyperkalemia: Secondary | ICD-10-CM | POA: Diagnosis not present

## 2021-05-05 DIAGNOSIS — N2581 Secondary hyperparathyroidism of renal origin: Secondary | ICD-10-CM | POA: Diagnosis not present

## 2021-05-05 DIAGNOSIS — N186 End stage renal disease: Secondary | ICD-10-CM | POA: Diagnosis not present

## 2021-05-08 DIAGNOSIS — E875 Hyperkalemia: Secondary | ICD-10-CM | POA: Diagnosis not present

## 2021-05-08 DIAGNOSIS — D509 Iron deficiency anemia, unspecified: Secondary | ICD-10-CM | POA: Diagnosis not present

## 2021-05-08 DIAGNOSIS — Z992 Dependence on renal dialysis: Secondary | ICD-10-CM | POA: Diagnosis not present

## 2021-05-08 DIAGNOSIS — N2581 Secondary hyperparathyroidism of renal origin: Secondary | ICD-10-CM | POA: Diagnosis not present

## 2021-05-08 DIAGNOSIS — N186 End stage renal disease: Secondary | ICD-10-CM | POA: Diagnosis not present

## 2021-05-08 DIAGNOSIS — D631 Anemia in chronic kidney disease: Secondary | ICD-10-CM | POA: Diagnosis not present

## 2021-05-09 DIAGNOSIS — R188 Other ascites: Secondary | ICD-10-CM | POA: Diagnosis not present

## 2021-05-10 DIAGNOSIS — D509 Iron deficiency anemia, unspecified: Secondary | ICD-10-CM | POA: Diagnosis not present

## 2021-05-10 DIAGNOSIS — N2581 Secondary hyperparathyroidism of renal origin: Secondary | ICD-10-CM | POA: Diagnosis not present

## 2021-05-10 DIAGNOSIS — Z992 Dependence on renal dialysis: Secondary | ICD-10-CM | POA: Diagnosis not present

## 2021-05-10 DIAGNOSIS — N186 End stage renal disease: Secondary | ICD-10-CM | POA: Diagnosis not present

## 2021-05-10 DIAGNOSIS — D631 Anemia in chronic kidney disease: Secondary | ICD-10-CM | POA: Diagnosis not present

## 2021-05-10 DIAGNOSIS — E875 Hyperkalemia: Secondary | ICD-10-CM | POA: Diagnosis not present

## 2021-05-12 DIAGNOSIS — N186 End stage renal disease: Secondary | ICD-10-CM | POA: Diagnosis not present

## 2021-05-12 DIAGNOSIS — N2581 Secondary hyperparathyroidism of renal origin: Secondary | ICD-10-CM | POA: Diagnosis not present

## 2021-05-12 DIAGNOSIS — E875 Hyperkalemia: Secondary | ICD-10-CM | POA: Diagnosis not present

## 2021-05-12 DIAGNOSIS — D631 Anemia in chronic kidney disease: Secondary | ICD-10-CM | POA: Diagnosis not present

## 2021-05-12 DIAGNOSIS — Z992 Dependence on renal dialysis: Secondary | ICD-10-CM | POA: Diagnosis not present

## 2021-05-12 DIAGNOSIS — D509 Iron deficiency anemia, unspecified: Secondary | ICD-10-CM | POA: Diagnosis not present

## 2021-05-14 DIAGNOSIS — Z23 Encounter for immunization: Secondary | ICD-10-CM | POA: Diagnosis not present

## 2021-05-15 DIAGNOSIS — N186 End stage renal disease: Secondary | ICD-10-CM | POA: Diagnosis not present

## 2021-05-15 DIAGNOSIS — D631 Anemia in chronic kidney disease: Secondary | ICD-10-CM | POA: Diagnosis not present

## 2021-05-15 DIAGNOSIS — Z992 Dependence on renal dialysis: Secondary | ICD-10-CM | POA: Diagnosis not present

## 2021-05-15 DIAGNOSIS — N2581 Secondary hyperparathyroidism of renal origin: Secondary | ICD-10-CM | POA: Diagnosis not present

## 2021-05-15 DIAGNOSIS — E875 Hyperkalemia: Secondary | ICD-10-CM | POA: Diagnosis not present

## 2021-05-15 DIAGNOSIS — D509 Iron deficiency anemia, unspecified: Secondary | ICD-10-CM | POA: Diagnosis not present

## 2021-05-17 DIAGNOSIS — D631 Anemia in chronic kidney disease: Secondary | ICD-10-CM | POA: Diagnosis not present

## 2021-05-17 DIAGNOSIS — E1122 Type 2 diabetes mellitus with diabetic chronic kidney disease: Secondary | ICD-10-CM | POA: Diagnosis not present

## 2021-05-17 DIAGNOSIS — N2581 Secondary hyperparathyroidism of renal origin: Secondary | ICD-10-CM | POA: Diagnosis not present

## 2021-05-17 DIAGNOSIS — E875 Hyperkalemia: Secondary | ICD-10-CM | POA: Diagnosis not present

## 2021-05-17 DIAGNOSIS — N186 End stage renal disease: Secondary | ICD-10-CM | POA: Diagnosis not present

## 2021-05-17 DIAGNOSIS — D509 Iron deficiency anemia, unspecified: Secondary | ICD-10-CM | POA: Diagnosis not present

## 2021-05-17 DIAGNOSIS — Z992 Dependence on renal dialysis: Secondary | ICD-10-CM | POA: Diagnosis not present

## 2021-05-19 DIAGNOSIS — E875 Hyperkalemia: Secondary | ICD-10-CM | POA: Diagnosis not present

## 2021-05-19 DIAGNOSIS — N186 End stage renal disease: Secondary | ICD-10-CM | POA: Diagnosis not present

## 2021-05-19 DIAGNOSIS — N2581 Secondary hyperparathyroidism of renal origin: Secondary | ICD-10-CM | POA: Diagnosis not present

## 2021-05-19 DIAGNOSIS — Z992 Dependence on renal dialysis: Secondary | ICD-10-CM | POA: Diagnosis not present

## 2021-05-19 DIAGNOSIS — D509 Iron deficiency anemia, unspecified: Secondary | ICD-10-CM | POA: Diagnosis not present

## 2021-05-19 DIAGNOSIS — D631 Anemia in chronic kidney disease: Secondary | ICD-10-CM | POA: Diagnosis not present

## 2021-05-22 DIAGNOSIS — N2581 Secondary hyperparathyroidism of renal origin: Secondary | ICD-10-CM | POA: Diagnosis not present

## 2021-05-22 DIAGNOSIS — D631 Anemia in chronic kidney disease: Secondary | ICD-10-CM | POA: Diagnosis not present

## 2021-05-22 DIAGNOSIS — D509 Iron deficiency anemia, unspecified: Secondary | ICD-10-CM | POA: Diagnosis not present

## 2021-05-22 DIAGNOSIS — N186 End stage renal disease: Secondary | ICD-10-CM | POA: Diagnosis not present

## 2021-05-22 DIAGNOSIS — E875 Hyperkalemia: Secondary | ICD-10-CM | POA: Diagnosis not present

## 2021-05-22 DIAGNOSIS — Z992 Dependence on renal dialysis: Secondary | ICD-10-CM | POA: Diagnosis not present

## 2021-05-24 DIAGNOSIS — E875 Hyperkalemia: Secondary | ICD-10-CM | POA: Diagnosis not present

## 2021-05-24 DIAGNOSIS — D631 Anemia in chronic kidney disease: Secondary | ICD-10-CM | POA: Diagnosis not present

## 2021-05-24 DIAGNOSIS — N2581 Secondary hyperparathyroidism of renal origin: Secondary | ICD-10-CM | POA: Diagnosis not present

## 2021-05-24 DIAGNOSIS — Z992 Dependence on renal dialysis: Secondary | ICD-10-CM | POA: Diagnosis not present

## 2021-05-24 DIAGNOSIS — D509 Iron deficiency anemia, unspecified: Secondary | ICD-10-CM | POA: Diagnosis not present

## 2021-05-24 DIAGNOSIS — N186 End stage renal disease: Secondary | ICD-10-CM | POA: Diagnosis not present

## 2021-05-26 DIAGNOSIS — N186 End stage renal disease: Secondary | ICD-10-CM | POA: Diagnosis not present

## 2021-05-26 DIAGNOSIS — D631 Anemia in chronic kidney disease: Secondary | ICD-10-CM | POA: Diagnosis not present

## 2021-05-26 DIAGNOSIS — D509 Iron deficiency anemia, unspecified: Secondary | ICD-10-CM | POA: Diagnosis not present

## 2021-05-26 DIAGNOSIS — E875 Hyperkalemia: Secondary | ICD-10-CM | POA: Diagnosis not present

## 2021-05-26 DIAGNOSIS — Z992 Dependence on renal dialysis: Secondary | ICD-10-CM | POA: Diagnosis not present

## 2021-05-26 DIAGNOSIS — N2581 Secondary hyperparathyroidism of renal origin: Secondary | ICD-10-CM | POA: Diagnosis not present

## 2021-05-28 DIAGNOSIS — Z992 Dependence on renal dialysis: Secondary | ICD-10-CM | POA: Diagnosis not present

## 2021-05-28 DIAGNOSIS — N186 End stage renal disease: Secondary | ICD-10-CM | POA: Diagnosis not present

## 2021-05-28 DIAGNOSIS — N032 Chronic nephritic syndrome with diffuse membranous glomerulonephritis: Secondary | ICD-10-CM | POA: Diagnosis not present

## 2021-05-29 DIAGNOSIS — Z992 Dependence on renal dialysis: Secondary | ICD-10-CM | POA: Diagnosis not present

## 2021-05-29 DIAGNOSIS — N186 End stage renal disease: Secondary | ICD-10-CM | POA: Diagnosis not present

## 2021-05-29 DIAGNOSIS — D631 Anemia in chronic kidney disease: Secondary | ICD-10-CM | POA: Diagnosis not present

## 2021-05-29 DIAGNOSIS — D509 Iron deficiency anemia, unspecified: Secondary | ICD-10-CM | POA: Diagnosis not present

## 2021-05-29 DIAGNOSIS — N2581 Secondary hyperparathyroidism of renal origin: Secondary | ICD-10-CM | POA: Diagnosis not present

## 2021-05-29 DIAGNOSIS — E1122 Type 2 diabetes mellitus with diabetic chronic kidney disease: Secondary | ICD-10-CM | POA: Diagnosis not present

## 2021-05-29 DIAGNOSIS — E875 Hyperkalemia: Secondary | ICD-10-CM | POA: Diagnosis not present

## 2021-05-30 ENCOUNTER — Other Ambulatory Visit: Payer: Self-pay | Admitting: Internal Medicine

## 2021-05-30 NOTE — Progress Notes (Signed)
Electrophysiology Office Note Date: 06/01/2021  ID:  James Nicholson November 09, 1965, MRN [id number]  PCP: [name], MD Primary Cardiologist: None Electrophysiologist: Cristopher Peru, MD   CC: Routine ICD follow-up  James Nicholson is a 55 y.o. male seen today for Cristopher Peru, MD for routine electrophysiology followup.   with history of resuscitated VF arrest w/ICD, HTN, HLD, CM of unknown etiology, chronic CHF (systolic), schizophrenia, ESRF on HD (Tues/Thurs/Sat).   James Nicholson present, but arrived at end of appointment. James Nicholson translates where needed. Patient answered questions asked to him in [place] without difficulty or prompting.  Since last being seen in our clinic the patient reports doing well. Earlier this year he did require abdominal paracentesis. he denies chest pain, palpitations, dyspnea, PND, orthopnea, nausea, vomiting, dizziness, syncope, edema, weight gain, or early satiety. He has not had ICD shocks. Tolerated HD without difficulty  Device History: Medtronic Single Chamber ICD implanted 2010, gen change 2019, lead veision 12/2019 for VF   Past Medical History:  Diagnosis Date   AICD (automatic cardioverter/defibrillator) present    Medtronic Visia AF MRIT VR [id number]   Anemia    Atrial fibrillation    Cardiomyopathy ([place]) 2010   Unclear Etiology: Last Echo 06/2009: EF 40-45%, severe Lateral & apical Hypokinesis (? CAD) ; Grade 2 DDysfxn. Mild conc LVH.    Cellulitis    Chronic kidney disease (CKD), stage V ([place])    Dialysis Tue, Thurs, Sat   Confusion    Depression    no meds   Diabetes mellitus without complication (HCC)    type 2, diet controlled, no meds, pt. denies   Dyslipidemia    Enterobacter sepsis ([place])    HLD (hyperlipidemia)    Hypertension    Pneumonia    S/P ICD (internal cardiac defibrillator) procedure 2010   VT Arrest (in [place])   Schizophrenia [facility])    no meds   Wegener's disease, pulmonary 02/05/2013    Wegener's granulomatosis    Past Surgical History:  Procedure Laterality Date   A/V FISTULAGRAM Right 03/17/2020   Procedure: A/V FISTULAGRAM;  Surgeon: Angelia Mould, MD;  Location: [place] CV LAB;  Service: Cardiovascular;  Laterality: Right;   AV FISTULA PLACEMENT Right 02/10/2013   Procedure: ARTERIOVENOUS (AV) FISTULA CREATION;  Surgeon: Rosetta Posner, MD;  Location: [facility] OR;  Service: Vascular;  Laterality: Right;  Right forearm radial/cephalic arterovenous fistula.    CARDIAC CATHETERIZATION  2010   [place]: In setting of VT arrest. Per Nicholson's report, nonobstructive with no intervention   CARDIAC DEFIBRILLATOR PLACEMENT  2010   [place]   EYE SURGERY Right    FISTULOGRAM Right 08/09/2013   Procedure: FISTULOGRAM;  Surgeon: Angelia Mould, MD;  Location: [facility] CATH LAB;  Service: Cardiovascular;  Laterality: Right;   ICD GENERATOR CHANGEOUT N/A 12/05/2017   Procedure: [place];  Surgeon: Evans Lance, MD;  Location: [place] CV LAB;  Service: Cardiovascular;  Laterality: N/A;   ICD LEAD REMOVAL N/A 01/10/2020   Procedure: ICD LEAD REMOVAL AND IMPLANTATION OF NEW LEAD IN RIGHT VENTRICLE;  Surgeon: Evans Lance, MD;  Location: [place] OR;  Service: Cardiovascular;  Laterality: N/A;   INSERTION OF DIALYSIS CATHETER Right 08/07/2020   Procedure: INSERTION OF DIALYSIS CATHETER;  Surgeon: Angelia Mould, MD;  Location: [place];  Service: Vascular;  Laterality: Right;   LIGATION OF ARTERIOVENOUS  FISTULA Right 07/14/9149   Procedure: PLICATION OF ARTERIOVENOUS  FISTULA RIGHT ARM;  Surgeon: Angelia Mould,  MD;  Location: [place];  Service: Vascular;  Laterality: Right;   LIGATION OF COMPETING BRANCHES OF ARTERIOVENOUS FISTULA Right 08/13/2013   Procedure: LIGATION OF COMPETING BRANCHES X5 OF ARTERIOVENOUS FISTULA- RIGHT ARM;  Surgeon: Serafina Mitchell, MD;  Location: [place];  Service: Vascular;  Laterality: Right;   REVISON OF ARTERIOVENOUS FISTULA Right  64/33/2951   Procedure: PLICATION OF ARTERIOVENOUS FISTULA RIGHT;  Surgeon: Angelia Mould, MD;  Location: [place];  Service: Vascular;  Laterality: Right;   TEE WITHOUT CARDIOVERSION N/A 01/10/2020   Procedure: TRANSESOPHAGEAL ECHOCARDIOGRAM (TEE);  Surgeon: Evans Lance, MD;  Location: [facility] OR;  Service: Cardiovascular;  Laterality: N/A;    Current Outpatient Medications  Medication Sig Dispense Refill   atorvastatin (LIPITOR) 20 MG tablet Take 1 tablet by mouth once daily 90 tablet 0   AURYXIA 1 GM 210 MG(Fe) tablet Take 210 mg by mouth 3 (three) times daily.      metoprolol succinate (TOPROL-XL) 50 MG 24 hr tablet Take 1 tablet by mouth once daily 90 tablet 1   heparin 1000 unit/mL SOLN injection Heparin Sodium (Porcine) 1,000 Units/mL Catheter Lock Arterial (Patient not taking: Reported on 06/01/2021)     Methoxy PEG-Epoetin Beta (MIRCERA IJ) Mircera (Patient not taking: Reported on 06/01/2021)     oxyCODONE-acetaminophen (PERCOCET) 5-325 MG tablet Take 1 tablet by mouth every 4 (four) hours as needed for severe pain. (Patient not taking: Reported on 06/01/2021) 15 tablet 0   predniSONE (DELTASONE) 5 MG tablet Take 5 mg by mouth daily.  (Patient not taking: Reported on 06/01/2021)     No current facility-administered medications for this visit.    Allergies:   Patient has no known allergies.   Social History: Social History   Socioeconomic History   Marital status: Single    Spouse name: Not on file   Number of children: Not on file   Years of education: Not on file   Highest education level: Not on file  Occupational History   Not on file  Tobacco Use   Smoking status: Former    Types: Cigarettes    Quit date: 10/28/2008    Years since quitting: 12.6   Smokeless tobacco: Never  Vaping Use   Vaping Use: Never used  Substance and Sexual Activity   Alcohol use: No   Drug use: No   Sexual activity: Not on file  Other Topics Concern   Not on file  Social History Narrative    Not on file   Social Determinants of Health   Financial Resource Strain: Not on file  Food Insecurity: Not on file  Transportation Needs: Not on file  Physical Activity: Not on file  Stress: Not on file  Social Connections: Not on file  Intimate Partner Violence: Not on file    Family History: Family History  Problem Relation Age of Onset   CAD Mother     Review of Systems: All other systems reviewed and are otherwise negative except as noted above.   Physical Exam: Vitals:   06/01/21 0741  BP: 124/80  Pulse: 64  SpO2: 99%  Weight: 150 lb (68 kg)  Height: 5\' 7"  (1.702 m)     GEN- The patient is well appearing, alert and oriented x 3 today.   HEENT: normocephalic, atraumatic; sclera clear, conjunctiva pink; hearing intact; oropharynx clear; neck supple, no JVP Lymph- no cervical lymphadenopathy Lungs- Clear to ausculation bilaterally, normal work of breathing.  No wheezes, rales, rhonchi Heart- Regular rate and rhythm,  no murmurs, rubs or gallops, PMI not laterally displaced GI- soft, non-tender, non-distended, bowel sounds present, no hepatosplenomegaly Extremities- no clubbing or cyanosis. No edema; DP/PT/radial pulses 2+ bilaterally MS- no significant deformity or atrophy Skin- warm and dry, no rash or lesion; ICD pocket well healed Psych- euthymic mood, full affect Neuro- strength and sensation are intact  ICD interrogation- reviewed in detail today,  See PACEART report  EKG:  EKG is ordered today. The ekg ordered today shows NSR at 64 bpm  Recent Labs: 07/07/2020: ALT 29; Platelets 202 08/07/2020: BUN 74; Creatinine, Ser 9.20; Hemoglobin 11.6; Potassium 5.0; Sodium 139   Wt Readings from Last 3 Encounters:  06/01/21 150 lb (68 kg)  09/06/20 156 lb 14.4 oz (71.2 kg)  08/07/20 158 lb 11.7 oz (72 kg)     Other studies Reviewed: Additional studies/ records that were reviewed today include: Previous EP office notes   Assessment and Plan:  1.  Chronic  systolic dysfunction s/p Medtronic single chamber ICD  euvolemic today Stable on an appropriate medical regimen Normal ICD function See Pace Art report No changes today Echo 01/09/2020 LVEF 30-35%. GDMT limited by ESRD  2. HTN Stable on current regimen  3. ESRD on HD Stable.  Current medicines are reviewed at length with the patient today.   The patient does not have concerns regarding his medicines.  The following changes were made today:  none  Labs/ tests ordered today include:  No orders of the defined types were placed in this encounter.   Disposition:   Follow up with Dr. Lovena Le in 12 months.    Jacalyn Lefevre, PA-C  06/01/2021 7:59 AM  [facility] [address] [place] [address] [phone] (office) [phone] (fax)

## 2021-05-31 DIAGNOSIS — D631 Anemia in chronic kidney disease: Secondary | ICD-10-CM | POA: Diagnosis not present

## 2021-05-31 DIAGNOSIS — N2581 Secondary hyperparathyroidism of renal origin: Secondary | ICD-10-CM | POA: Diagnosis not present

## 2021-05-31 DIAGNOSIS — D509 Iron deficiency anemia, unspecified: Secondary | ICD-10-CM | POA: Diagnosis not present

## 2021-05-31 DIAGNOSIS — N186 End stage renal disease: Secondary | ICD-10-CM | POA: Diagnosis not present

## 2021-05-31 DIAGNOSIS — E875 Hyperkalemia: Secondary | ICD-10-CM | POA: Diagnosis not present

## 2021-05-31 DIAGNOSIS — Z992 Dependence on renal dialysis: Secondary | ICD-10-CM | POA: Diagnosis not present

## 2021-06-01 ENCOUNTER — Other Ambulatory Visit: Payer: Self-pay

## 2021-06-01 ENCOUNTER — Encounter (INDEPENDENT_AMBULATORY_CARE_PROVIDER_SITE_OTHER): Payer: Self-pay

## 2021-06-01 ENCOUNTER — Encounter: Payer: Self-pay | Admitting: Student

## 2021-06-01 ENCOUNTER — Ambulatory Visit (INDEPENDENT_AMBULATORY_CARE_PROVIDER_SITE_OTHER): Payer: Medicare Other | Admitting: Student

## 2021-06-01 VITALS — BP 124/80 | HR 64 | Ht 67.0 in | Wt 150.0 lb

## 2021-06-01 DIAGNOSIS — I5022 Chronic systolic (congestive) heart failure: Secondary | ICD-10-CM | POA: Diagnosis not present

## 2021-06-01 DIAGNOSIS — I10 Essential (primary) hypertension: Secondary | ICD-10-CM | POA: Diagnosis not present

## 2021-06-01 DIAGNOSIS — I4901 Ventricular fibrillation: Secondary | ICD-10-CM

## 2021-06-01 DIAGNOSIS — I255 Ischemic cardiomyopathy: Secondary | ICD-10-CM

## 2021-06-01 LAB — CUP PACEART INCLINIC DEVICE CHECK
Battery Remaining Longevity: 73 mo
Battery Voltage: 2.99 V
Brady Statistic RV Percent Paced: 0.01 %
Date Time Interrogation Session: 20220805081537
HighPow Impedance: 254 Ohm
HighPow Impedance: 39 Ohm
Implantable Lead Implant Date: 20210315
Implantable Lead Location: 753860
Implantable Lead Model: 6935
Implantable Pulse Generator Implant Date: 20190208
Lead Channel Impedance Value: 285 Ohm
Lead Channel Impedance Value: 361 Ohm
Lead Channel Pacing Threshold Amplitude: 0.875 V
Lead Channel Pacing Threshold Pulse Width: 0.4 ms
Lead Channel Sensing Intrinsic Amplitude: 8.25 mV
Lead Channel Sensing Intrinsic Amplitude: 9.875 mV
Lead Channel Setting Pacing Amplitude: 2 V
Lead Channel Setting Pacing Pulse Width: 0.4 ms
Lead Channel Setting Sensing Sensitivity: 0.3 mV

## 2021-06-01 NOTE — Patient Instructions (Signed)
Medication Instructions:  Your physician recommends that you continue on your current medications as directed. Please refer to the Current Medication list given to you today.  *If you need a refill on your cardiac medications before your next appointment, please call your pharmacy*   Lab Work: None If you have labs (blood work) drawn today and your tests are completely normal, you will receive your results only by: [place] (if you have MyChart) OR A paper copy in the mail If you have any lab test that is abnormal or we need to change your treatment, we will call you to review the results.   Follow-Up: At [facility], you and your health needs are our priority.  As part of our continuing mission to provide you with exceptional heart care, we have created designated Provider Care Teams.  These Care Teams include your primary Cardiologist (physician) and Advanced Practice Providers (APPs -  Physician Assistants and Nurse Practitioners) who all work together to provide you with the care you need, when you need it.  We recommend signing up for the patient portal called "MyChart".  Sign up information is provided on this After Visit Summary.  MyChart is used to connect with patients for Virtual Visits (Telemedicine).  Patients are able to view lab/test results, encounter notes, upcoming appointments, etc.  Non-urgent messages can be sent to your provider as well.   To learn more about what you can do with MyChart, go to [email or web address].    Your next appointment:   1 year(s)  The format for your next appointment:   In Person  Provider:   You may see Cristopher Peru, MD or one of the following Advanced Practice Providers on your designated Care Team:   Tommye Standard, [place] "[facility]" [place], [place]

## 2021-06-02 DIAGNOSIS — N186 End stage renal disease: Secondary | ICD-10-CM | POA: Diagnosis not present

## 2021-06-02 DIAGNOSIS — D631 Anemia in chronic kidney disease: Secondary | ICD-10-CM | POA: Diagnosis not present

## 2021-06-02 DIAGNOSIS — Z992 Dependence on renal dialysis: Secondary | ICD-10-CM | POA: Diagnosis not present

## 2021-06-02 DIAGNOSIS — D509 Iron deficiency anemia, unspecified: Secondary | ICD-10-CM | POA: Diagnosis not present

## 2021-06-02 DIAGNOSIS — N2581 Secondary hyperparathyroidism of renal origin: Secondary | ICD-10-CM | POA: Diagnosis not present

## 2021-06-02 DIAGNOSIS — E875 Hyperkalemia: Secondary | ICD-10-CM | POA: Diagnosis not present

## 2021-06-05 DIAGNOSIS — Z992 Dependence on renal dialysis: Secondary | ICD-10-CM | POA: Diagnosis not present

## 2021-06-05 DIAGNOSIS — E875 Hyperkalemia: Secondary | ICD-10-CM | POA: Diagnosis not present

## 2021-06-05 DIAGNOSIS — N186 End stage renal disease: Secondary | ICD-10-CM | POA: Diagnosis not present

## 2021-06-05 DIAGNOSIS — D631 Anemia in chronic kidney disease: Secondary | ICD-10-CM | POA: Diagnosis not present

## 2021-06-05 DIAGNOSIS — N2581 Secondary hyperparathyroidism of renal origin: Secondary | ICD-10-CM | POA: Diagnosis not present

## 2021-06-05 DIAGNOSIS — D509 Iron deficiency anemia, unspecified: Secondary | ICD-10-CM | POA: Diagnosis not present

## 2021-06-07 DIAGNOSIS — D509 Iron deficiency anemia, unspecified: Secondary | ICD-10-CM | POA: Diagnosis not present

## 2021-06-07 DIAGNOSIS — N2581 Secondary hyperparathyroidism of renal origin: Secondary | ICD-10-CM | POA: Diagnosis not present

## 2021-06-07 DIAGNOSIS — D631 Anemia in chronic kidney disease: Secondary | ICD-10-CM | POA: Diagnosis not present

## 2021-06-07 DIAGNOSIS — E875 Hyperkalemia: Secondary | ICD-10-CM | POA: Diagnosis not present

## 2021-06-07 DIAGNOSIS — N186 End stage renal disease: Secondary | ICD-10-CM | POA: Diagnosis not present

## 2021-06-07 DIAGNOSIS — Z992 Dependence on renal dialysis: Secondary | ICD-10-CM | POA: Diagnosis not present

## 2021-06-09 DIAGNOSIS — D509 Iron deficiency anemia, unspecified: Secondary | ICD-10-CM | POA: Diagnosis not present

## 2021-06-09 DIAGNOSIS — D631 Anemia in chronic kidney disease: Secondary | ICD-10-CM | POA: Diagnosis not present

## 2021-06-09 DIAGNOSIS — E875 Hyperkalemia: Secondary | ICD-10-CM | POA: Diagnosis not present

## 2021-06-09 DIAGNOSIS — N2581 Secondary hyperparathyroidism of renal origin: Secondary | ICD-10-CM | POA: Diagnosis not present

## 2021-06-09 DIAGNOSIS — N186 End stage renal disease: Secondary | ICD-10-CM | POA: Diagnosis not present

## 2021-06-09 DIAGNOSIS — Z992 Dependence on renal dialysis: Secondary | ICD-10-CM | POA: Diagnosis not present

## 2021-06-12 DIAGNOSIS — N186 End stage renal disease: Secondary | ICD-10-CM | POA: Diagnosis not present

## 2021-06-12 DIAGNOSIS — D509 Iron deficiency anemia, unspecified: Secondary | ICD-10-CM | POA: Diagnosis not present

## 2021-06-12 DIAGNOSIS — Z992 Dependence on renal dialysis: Secondary | ICD-10-CM | POA: Diagnosis not present

## 2021-06-12 DIAGNOSIS — D631 Anemia in chronic kidney disease: Secondary | ICD-10-CM | POA: Diagnosis not present

## 2021-06-12 DIAGNOSIS — E875 Hyperkalemia: Secondary | ICD-10-CM | POA: Diagnosis not present

## 2021-06-12 DIAGNOSIS — N2581 Secondary hyperparathyroidism of renal origin: Secondary | ICD-10-CM | POA: Diagnosis not present

## 2021-06-13 DIAGNOSIS — Z992 Dependence on renal dialysis: Secondary | ICD-10-CM | POA: Diagnosis not present

## 2021-06-13 DIAGNOSIS — Z79899 Other long term (current) drug therapy: Secondary | ICD-10-CM | POA: Diagnosis not present

## 2021-06-13 DIAGNOSIS — I776 Arteritis, unspecified: Secondary | ICD-10-CM | POA: Diagnosis not present

## 2021-06-13 DIAGNOSIS — M3130 Wegener's granulomatosis without renal involvement: Secondary | ICD-10-CM | POA: Diagnosis not present

## 2021-06-13 DIAGNOSIS — N186 End stage renal disease: Secondary | ICD-10-CM | POA: Diagnosis not present

## 2021-06-13 DIAGNOSIS — Z5181 Encounter for therapeutic drug level monitoring: Secondary | ICD-10-CM | POA: Diagnosis not present

## 2021-06-14 DIAGNOSIS — Z992 Dependence on renal dialysis: Secondary | ICD-10-CM | POA: Diagnosis not present

## 2021-06-14 DIAGNOSIS — E875 Hyperkalemia: Secondary | ICD-10-CM | POA: Diagnosis not present

## 2021-06-14 DIAGNOSIS — D631 Anemia in chronic kidney disease: Secondary | ICD-10-CM | POA: Diagnosis not present

## 2021-06-14 DIAGNOSIS — N186 End stage renal disease: Secondary | ICD-10-CM | POA: Diagnosis not present

## 2021-06-14 DIAGNOSIS — N2581 Secondary hyperparathyroidism of renal origin: Secondary | ICD-10-CM | POA: Diagnosis not present

## 2021-06-14 DIAGNOSIS — D509 Iron deficiency anemia, unspecified: Secondary | ICD-10-CM | POA: Diagnosis not present

## 2021-06-15 DIAGNOSIS — I776 Arteritis, unspecified: Secondary | ICD-10-CM | POA: Diagnosis not present

## 2021-06-15 DIAGNOSIS — M3131 Wegener's granulomatosis with renal involvement: Secondary | ICD-10-CM | POA: Diagnosis not present

## 2021-06-16 DIAGNOSIS — D631 Anemia in chronic kidney disease: Secondary | ICD-10-CM | POA: Diagnosis not present

## 2021-06-16 DIAGNOSIS — N186 End stage renal disease: Secondary | ICD-10-CM | POA: Diagnosis not present

## 2021-06-16 DIAGNOSIS — N2581 Secondary hyperparathyroidism of renal origin: Secondary | ICD-10-CM | POA: Diagnosis not present

## 2021-06-16 DIAGNOSIS — E875 Hyperkalemia: Secondary | ICD-10-CM | POA: Diagnosis not present

## 2021-06-16 DIAGNOSIS — D509 Iron deficiency anemia, unspecified: Secondary | ICD-10-CM | POA: Diagnosis not present

## 2021-06-16 DIAGNOSIS — Z992 Dependence on renal dialysis: Secondary | ICD-10-CM | POA: Diagnosis not present

## 2021-06-19 DIAGNOSIS — E875 Hyperkalemia: Secondary | ICD-10-CM | POA: Diagnosis not present

## 2021-06-19 DIAGNOSIS — D631 Anemia in chronic kidney disease: Secondary | ICD-10-CM | POA: Diagnosis not present

## 2021-06-19 DIAGNOSIS — Z992 Dependence on renal dialysis: Secondary | ICD-10-CM | POA: Diagnosis not present

## 2021-06-19 DIAGNOSIS — N186 End stage renal disease: Secondary | ICD-10-CM | POA: Diagnosis not present

## 2021-06-19 DIAGNOSIS — D509 Iron deficiency anemia, unspecified: Secondary | ICD-10-CM | POA: Diagnosis not present

## 2021-06-19 DIAGNOSIS — N2581 Secondary hyperparathyroidism of renal origin: Secondary | ICD-10-CM | POA: Diagnosis not present

## 2021-06-21 DIAGNOSIS — D631 Anemia in chronic kidney disease: Secondary | ICD-10-CM | POA: Diagnosis not present

## 2021-06-21 DIAGNOSIS — E875 Hyperkalemia: Secondary | ICD-10-CM | POA: Diagnosis not present

## 2021-06-21 DIAGNOSIS — D509 Iron deficiency anemia, unspecified: Secondary | ICD-10-CM | POA: Diagnosis not present

## 2021-06-21 DIAGNOSIS — Z992 Dependence on renal dialysis: Secondary | ICD-10-CM | POA: Diagnosis not present

## 2021-06-21 DIAGNOSIS — N186 End stage renal disease: Secondary | ICD-10-CM | POA: Diagnosis not present

## 2021-06-21 DIAGNOSIS — N2581 Secondary hyperparathyroidism of renal origin: Secondary | ICD-10-CM | POA: Diagnosis not present

## 2021-06-23 DIAGNOSIS — D509 Iron deficiency anemia, unspecified: Secondary | ICD-10-CM | POA: Diagnosis not present

## 2021-06-23 DIAGNOSIS — N2581 Secondary hyperparathyroidism of renal origin: Secondary | ICD-10-CM | POA: Diagnosis not present

## 2021-06-23 DIAGNOSIS — D631 Anemia in chronic kidney disease: Secondary | ICD-10-CM | POA: Diagnosis not present

## 2021-06-23 DIAGNOSIS — Z992 Dependence on renal dialysis: Secondary | ICD-10-CM | POA: Diagnosis not present

## 2021-06-23 DIAGNOSIS — E875 Hyperkalemia: Secondary | ICD-10-CM | POA: Diagnosis not present

## 2021-06-23 DIAGNOSIS — N186 End stage renal disease: Secondary | ICD-10-CM | POA: Diagnosis not present

## 2021-06-26 DIAGNOSIS — N2581 Secondary hyperparathyroidism of renal origin: Secondary | ICD-10-CM | POA: Diagnosis not present

## 2021-06-26 DIAGNOSIS — N186 End stage renal disease: Secondary | ICD-10-CM | POA: Diagnosis not present

## 2021-06-26 DIAGNOSIS — D631 Anemia in chronic kidney disease: Secondary | ICD-10-CM | POA: Diagnosis not present

## 2021-06-26 DIAGNOSIS — D509 Iron deficiency anemia, unspecified: Secondary | ICD-10-CM | POA: Diagnosis not present

## 2021-06-26 DIAGNOSIS — Z992 Dependence on renal dialysis: Secondary | ICD-10-CM | POA: Diagnosis not present

## 2021-06-26 DIAGNOSIS — E875 Hyperkalemia: Secondary | ICD-10-CM | POA: Diagnosis not present

## 2021-06-28 DIAGNOSIS — E1122 Type 2 diabetes mellitus with diabetic chronic kidney disease: Secondary | ICD-10-CM | POA: Diagnosis not present

## 2021-06-28 DIAGNOSIS — M3131 Wegener's granulomatosis with renal involvement: Secondary | ICD-10-CM | POA: Diagnosis not present

## 2021-06-28 DIAGNOSIS — D509 Iron deficiency anemia, unspecified: Secondary | ICD-10-CM | POA: Diagnosis not present

## 2021-06-28 DIAGNOSIS — Z20822 Contact with and (suspected) exposure to covid-19: Secondary | ICD-10-CM | POA: Diagnosis not present

## 2021-06-28 DIAGNOSIS — D631 Anemia in chronic kidney disease: Secondary | ICD-10-CM | POA: Diagnosis not present

## 2021-06-28 DIAGNOSIS — N032 Chronic nephritic syndrome with diffuse membranous glomerulonephritis: Secondary | ICD-10-CM | POA: Diagnosis not present

## 2021-06-28 DIAGNOSIS — N2581 Secondary hyperparathyroidism of renal origin: Secondary | ICD-10-CM | POA: Diagnosis not present

## 2021-06-28 DIAGNOSIS — Z992 Dependence on renal dialysis: Secondary | ICD-10-CM | POA: Diagnosis not present

## 2021-06-28 DIAGNOSIS — I776 Arteritis, unspecified: Secondary | ICD-10-CM | POA: Diagnosis not present

## 2021-06-28 DIAGNOSIS — E875 Hyperkalemia: Secondary | ICD-10-CM | POA: Diagnosis not present

## 2021-06-28 DIAGNOSIS — N186 End stage renal disease: Secondary | ICD-10-CM | POA: Diagnosis not present

## 2021-06-28 DIAGNOSIS — Z23 Encounter for immunization: Secondary | ICD-10-CM | POA: Diagnosis not present

## 2021-06-29 ENCOUNTER — Other Ambulatory Visit: Payer: Self-pay | Admitting: Internal Medicine

## 2021-06-29 DIAGNOSIS — M3131 Wegener's granulomatosis with renal involvement: Secondary | ICD-10-CM | POA: Diagnosis not present

## 2021-06-29 DIAGNOSIS — I776 Arteritis, unspecified: Secondary | ICD-10-CM | POA: Diagnosis not present

## 2021-06-30 DIAGNOSIS — D631 Anemia in chronic kidney disease: Secondary | ICD-10-CM | POA: Diagnosis not present

## 2021-06-30 DIAGNOSIS — E875 Hyperkalemia: Secondary | ICD-10-CM | POA: Diagnosis not present

## 2021-06-30 DIAGNOSIS — N186 End stage renal disease: Secondary | ICD-10-CM | POA: Diagnosis not present

## 2021-06-30 DIAGNOSIS — N2581 Secondary hyperparathyroidism of renal origin: Secondary | ICD-10-CM | POA: Diagnosis not present

## 2021-06-30 DIAGNOSIS — D509 Iron deficiency anemia, unspecified: Secondary | ICD-10-CM | POA: Diagnosis not present

## 2021-06-30 DIAGNOSIS — Z992 Dependence on renal dialysis: Secondary | ICD-10-CM | POA: Diagnosis not present

## 2021-07-03 DIAGNOSIS — D509 Iron deficiency anemia, unspecified: Secondary | ICD-10-CM | POA: Diagnosis not present

## 2021-07-03 DIAGNOSIS — E875 Hyperkalemia: Secondary | ICD-10-CM | POA: Diagnosis not present

## 2021-07-03 DIAGNOSIS — N186 End stage renal disease: Secondary | ICD-10-CM | POA: Diagnosis not present

## 2021-07-03 DIAGNOSIS — N2581 Secondary hyperparathyroidism of renal origin: Secondary | ICD-10-CM | POA: Diagnosis not present

## 2021-07-03 DIAGNOSIS — D631 Anemia in chronic kidney disease: Secondary | ICD-10-CM | POA: Diagnosis not present

## 2021-07-03 DIAGNOSIS — Z992 Dependence on renal dialysis: Secondary | ICD-10-CM | POA: Diagnosis not present

## 2021-07-05 DIAGNOSIS — N186 End stage renal disease: Secondary | ICD-10-CM | POA: Diagnosis not present

## 2021-07-05 DIAGNOSIS — E875 Hyperkalemia: Secondary | ICD-10-CM | POA: Diagnosis not present

## 2021-07-05 DIAGNOSIS — D509 Iron deficiency anemia, unspecified: Secondary | ICD-10-CM | POA: Diagnosis not present

## 2021-07-05 DIAGNOSIS — N2581 Secondary hyperparathyroidism of renal origin: Secondary | ICD-10-CM | POA: Diagnosis not present

## 2021-07-05 DIAGNOSIS — Z992 Dependence on renal dialysis: Secondary | ICD-10-CM | POA: Diagnosis not present

## 2021-07-05 DIAGNOSIS — D631 Anemia in chronic kidney disease: Secondary | ICD-10-CM | POA: Diagnosis not present

## 2021-07-07 DIAGNOSIS — E875 Hyperkalemia: Secondary | ICD-10-CM | POA: Diagnosis not present

## 2021-07-07 DIAGNOSIS — N2581 Secondary hyperparathyroidism of renal origin: Secondary | ICD-10-CM | POA: Diagnosis not present

## 2021-07-07 DIAGNOSIS — D631 Anemia in chronic kidney disease: Secondary | ICD-10-CM | POA: Diagnosis not present

## 2021-07-07 DIAGNOSIS — Z992 Dependence on renal dialysis: Secondary | ICD-10-CM | POA: Diagnosis not present

## 2021-07-07 DIAGNOSIS — D509 Iron deficiency anemia, unspecified: Secondary | ICD-10-CM | POA: Diagnosis not present

## 2021-07-07 DIAGNOSIS — N186 End stage renal disease: Secondary | ICD-10-CM | POA: Diagnosis not present

## 2021-07-10 DIAGNOSIS — N2581 Secondary hyperparathyroidism of renal origin: Secondary | ICD-10-CM | POA: Diagnosis not present

## 2021-07-10 DIAGNOSIS — N186 End stage renal disease: Secondary | ICD-10-CM | POA: Diagnosis not present

## 2021-07-10 DIAGNOSIS — E875 Hyperkalemia: Secondary | ICD-10-CM | POA: Diagnosis not present

## 2021-07-10 DIAGNOSIS — D509 Iron deficiency anemia, unspecified: Secondary | ICD-10-CM | POA: Diagnosis not present

## 2021-07-10 DIAGNOSIS — Z992 Dependence on renal dialysis: Secondary | ICD-10-CM | POA: Diagnosis not present

## 2021-07-10 DIAGNOSIS — D631 Anemia in chronic kidney disease: Secondary | ICD-10-CM | POA: Diagnosis not present

## 2021-07-12 ENCOUNTER — Ambulatory Visit (INDEPENDENT_AMBULATORY_CARE_PROVIDER_SITE_OTHER): Payer: Medicare Other

## 2021-07-12 DIAGNOSIS — N186 End stage renal disease: Secondary | ICD-10-CM | POA: Diagnosis not present

## 2021-07-12 DIAGNOSIS — D631 Anemia in chronic kidney disease: Secondary | ICD-10-CM | POA: Diagnosis not present

## 2021-07-12 DIAGNOSIS — Z992 Dependence on renal dialysis: Secondary | ICD-10-CM | POA: Diagnosis not present

## 2021-07-12 DIAGNOSIS — I255 Ischemic cardiomyopathy: Secondary | ICD-10-CM

## 2021-07-12 DIAGNOSIS — N2581 Secondary hyperparathyroidism of renal origin: Secondary | ICD-10-CM | POA: Diagnosis not present

## 2021-07-12 DIAGNOSIS — E875 Hyperkalemia: Secondary | ICD-10-CM | POA: Diagnosis not present

## 2021-07-12 DIAGNOSIS — D509 Iron deficiency anemia, unspecified: Secondary | ICD-10-CM | POA: Diagnosis not present

## 2021-07-14 DIAGNOSIS — N2581 Secondary hyperparathyroidism of renal origin: Secondary | ICD-10-CM | POA: Diagnosis not present

## 2021-07-14 DIAGNOSIS — E875 Hyperkalemia: Secondary | ICD-10-CM | POA: Diagnosis not present

## 2021-07-14 DIAGNOSIS — N186 End stage renal disease: Secondary | ICD-10-CM | POA: Diagnosis not present

## 2021-07-14 DIAGNOSIS — D509 Iron deficiency anemia, unspecified: Secondary | ICD-10-CM | POA: Diagnosis not present

## 2021-07-14 DIAGNOSIS — Z992 Dependence on renal dialysis: Secondary | ICD-10-CM | POA: Diagnosis not present

## 2021-07-14 DIAGNOSIS — D631 Anemia in chronic kidney disease: Secondary | ICD-10-CM | POA: Diagnosis not present

## 2021-07-14 LAB — CUP PACEART REMOTE DEVICE CHECK
Battery Remaining Longevity: 71 mo
Battery Voltage: 2.99 V
Brady Statistic RV Percent Paced: 0.04 %
Date Time Interrogation Session: 20220915153623
HighPow Impedance: 254 Ohm
HighPow Impedance: 43 Ohm
Implantable Lead Implant Date: 20210315
Implantable Lead Location: 753860
Implantable Lead Model: 6935
Implantable Pulse Generator Implant Date: 20190208
Lead Channel Impedance Value: 285 Ohm
Lead Channel Impedance Value: 361 Ohm
Lead Channel Pacing Threshold Amplitude: 0.75 V
Lead Channel Pacing Threshold Pulse Width: 0.4 ms
Lead Channel Sensing Intrinsic Amplitude: 8.125 mV
Lead Channel Sensing Intrinsic Amplitude: 8.125 mV
Lead Channel Setting Pacing Amplitude: 2 V
Lead Channel Setting Pacing Pulse Width: 0.4 ms
Lead Channel Setting Sensing Sensitivity: 0.3 mV

## 2021-07-17 DIAGNOSIS — Z992 Dependence on renal dialysis: Secondary | ICD-10-CM | POA: Diagnosis not present

## 2021-07-17 DIAGNOSIS — N186 End stage renal disease: Secondary | ICD-10-CM | POA: Diagnosis not present

## 2021-07-17 DIAGNOSIS — N2581 Secondary hyperparathyroidism of renal origin: Secondary | ICD-10-CM | POA: Diagnosis not present

## 2021-07-17 DIAGNOSIS — D509 Iron deficiency anemia, unspecified: Secondary | ICD-10-CM | POA: Diagnosis not present

## 2021-07-17 DIAGNOSIS — D631 Anemia in chronic kidney disease: Secondary | ICD-10-CM | POA: Diagnosis not present

## 2021-07-17 DIAGNOSIS — E875 Hyperkalemia: Secondary | ICD-10-CM | POA: Diagnosis not present

## 2021-07-19 DIAGNOSIS — D631 Anemia in chronic kidney disease: Secondary | ICD-10-CM | POA: Diagnosis not present

## 2021-07-19 DIAGNOSIS — N186 End stage renal disease: Secondary | ICD-10-CM | POA: Diagnosis not present

## 2021-07-19 DIAGNOSIS — E875 Hyperkalemia: Secondary | ICD-10-CM | POA: Diagnosis not present

## 2021-07-19 DIAGNOSIS — D509 Iron deficiency anemia, unspecified: Secondary | ICD-10-CM | POA: Diagnosis not present

## 2021-07-19 DIAGNOSIS — Z992 Dependence on renal dialysis: Secondary | ICD-10-CM | POA: Diagnosis not present

## 2021-07-19 DIAGNOSIS — N2581 Secondary hyperparathyroidism of renal origin: Secondary | ICD-10-CM | POA: Diagnosis not present

## 2021-07-19 NOTE — Progress Notes (Signed)
Remote ICD transmission.   

## 2021-07-21 DIAGNOSIS — D509 Iron deficiency anemia, unspecified: Secondary | ICD-10-CM | POA: Diagnosis not present

## 2021-07-21 DIAGNOSIS — Z992 Dependence on renal dialysis: Secondary | ICD-10-CM | POA: Diagnosis not present

## 2021-07-21 DIAGNOSIS — N2581 Secondary hyperparathyroidism of renal origin: Secondary | ICD-10-CM | POA: Diagnosis not present

## 2021-07-21 DIAGNOSIS — E875 Hyperkalemia: Secondary | ICD-10-CM | POA: Diagnosis not present

## 2021-07-21 DIAGNOSIS — D631 Anemia in chronic kidney disease: Secondary | ICD-10-CM | POA: Diagnosis not present

## 2021-07-21 DIAGNOSIS — N186 End stage renal disease: Secondary | ICD-10-CM | POA: Diagnosis not present

## 2021-07-24 DIAGNOSIS — N2581 Secondary hyperparathyroidism of renal origin: Secondary | ICD-10-CM | POA: Diagnosis not present

## 2021-07-24 DIAGNOSIS — Z992 Dependence on renal dialysis: Secondary | ICD-10-CM | POA: Diagnosis not present

## 2021-07-24 DIAGNOSIS — D509 Iron deficiency anemia, unspecified: Secondary | ICD-10-CM | POA: Diagnosis not present

## 2021-07-24 DIAGNOSIS — D631 Anemia in chronic kidney disease: Secondary | ICD-10-CM | POA: Diagnosis not present

## 2021-07-24 DIAGNOSIS — N186 End stage renal disease: Secondary | ICD-10-CM | POA: Diagnosis not present

## 2021-07-24 DIAGNOSIS — E875 Hyperkalemia: Secondary | ICD-10-CM | POA: Diagnosis not present

## 2021-07-26 DIAGNOSIS — E875 Hyperkalemia: Secondary | ICD-10-CM | POA: Diagnosis not present

## 2021-07-26 DIAGNOSIS — N186 End stage renal disease: Secondary | ICD-10-CM | POA: Diagnosis not present

## 2021-07-26 DIAGNOSIS — N2581 Secondary hyperparathyroidism of renal origin: Secondary | ICD-10-CM | POA: Diagnosis not present

## 2021-07-26 DIAGNOSIS — Z992 Dependence on renal dialysis: Secondary | ICD-10-CM | POA: Diagnosis not present

## 2021-07-26 DIAGNOSIS — D509 Iron deficiency anemia, unspecified: Secondary | ICD-10-CM | POA: Diagnosis not present

## 2021-07-26 DIAGNOSIS — D631 Anemia in chronic kidney disease: Secondary | ICD-10-CM | POA: Diagnosis not present

## 2021-07-28 DIAGNOSIS — Z992 Dependence on renal dialysis: Secondary | ICD-10-CM | POA: Diagnosis not present

## 2021-07-28 DIAGNOSIS — D509 Iron deficiency anemia, unspecified: Secondary | ICD-10-CM | POA: Diagnosis not present

## 2021-07-28 DIAGNOSIS — N032 Chronic nephritic syndrome with diffuse membranous glomerulonephritis: Secondary | ICD-10-CM | POA: Diagnosis not present

## 2021-07-28 DIAGNOSIS — N186 End stage renal disease: Secondary | ICD-10-CM | POA: Diagnosis not present

## 2021-07-28 DIAGNOSIS — E1122 Type 2 diabetes mellitus with diabetic chronic kidney disease: Secondary | ICD-10-CM | POA: Diagnosis not present

## 2021-07-28 DIAGNOSIS — D631 Anemia in chronic kidney disease: Secondary | ICD-10-CM | POA: Diagnosis not present

## 2021-07-28 DIAGNOSIS — N2581 Secondary hyperparathyroidism of renal origin: Secondary | ICD-10-CM | POA: Diagnosis not present

## 2021-07-28 DIAGNOSIS — E875 Hyperkalemia: Secondary | ICD-10-CM | POA: Diagnosis not present

## 2021-07-31 DIAGNOSIS — N186 End stage renal disease: Secondary | ICD-10-CM | POA: Diagnosis not present

## 2021-07-31 DIAGNOSIS — D509 Iron deficiency anemia, unspecified: Secondary | ICD-10-CM | POA: Diagnosis not present

## 2021-07-31 DIAGNOSIS — N2581 Secondary hyperparathyroidism of renal origin: Secondary | ICD-10-CM | POA: Diagnosis not present

## 2021-07-31 DIAGNOSIS — Z992 Dependence on renal dialysis: Secondary | ICD-10-CM | POA: Diagnosis not present

## 2021-07-31 DIAGNOSIS — D631 Anemia in chronic kidney disease: Secondary | ICD-10-CM | POA: Diagnosis not present

## 2021-07-31 DIAGNOSIS — E1122 Type 2 diabetes mellitus with diabetic chronic kidney disease: Secondary | ICD-10-CM | POA: Diagnosis not present

## 2021-08-02 DIAGNOSIS — D509 Iron deficiency anemia, unspecified: Secondary | ICD-10-CM | POA: Diagnosis not present

## 2021-08-02 DIAGNOSIS — N2581 Secondary hyperparathyroidism of renal origin: Secondary | ICD-10-CM | POA: Diagnosis not present

## 2021-08-02 DIAGNOSIS — E1122 Type 2 diabetes mellitus with diabetic chronic kidney disease: Secondary | ICD-10-CM | POA: Diagnosis not present

## 2021-08-02 DIAGNOSIS — Z992 Dependence on renal dialysis: Secondary | ICD-10-CM | POA: Diagnosis not present

## 2021-08-02 DIAGNOSIS — N186 End stage renal disease: Secondary | ICD-10-CM | POA: Diagnosis not present

## 2021-08-02 DIAGNOSIS — D631 Anemia in chronic kidney disease: Secondary | ICD-10-CM | POA: Diagnosis not present

## 2021-08-04 DIAGNOSIS — E1122 Type 2 diabetes mellitus with diabetic chronic kidney disease: Secondary | ICD-10-CM | POA: Diagnosis not present

## 2021-08-04 DIAGNOSIS — D631 Anemia in chronic kidney disease: Secondary | ICD-10-CM | POA: Diagnosis not present

## 2021-08-04 DIAGNOSIS — D509 Iron deficiency anemia, unspecified: Secondary | ICD-10-CM | POA: Diagnosis not present

## 2021-08-04 DIAGNOSIS — N186 End stage renal disease: Secondary | ICD-10-CM | POA: Diagnosis not present

## 2021-08-04 DIAGNOSIS — N2581 Secondary hyperparathyroidism of renal origin: Secondary | ICD-10-CM | POA: Diagnosis not present

## 2021-08-04 DIAGNOSIS — Z992 Dependence on renal dialysis: Secondary | ICD-10-CM | POA: Diagnosis not present

## 2021-08-07 DIAGNOSIS — N186 End stage renal disease: Secondary | ICD-10-CM | POA: Diagnosis not present

## 2021-08-07 DIAGNOSIS — E1122 Type 2 diabetes mellitus with diabetic chronic kidney disease: Secondary | ICD-10-CM | POA: Diagnosis not present

## 2021-08-07 DIAGNOSIS — D509 Iron deficiency anemia, unspecified: Secondary | ICD-10-CM | POA: Diagnosis not present

## 2021-08-07 DIAGNOSIS — N2581 Secondary hyperparathyroidism of renal origin: Secondary | ICD-10-CM | POA: Diagnosis not present

## 2021-08-07 DIAGNOSIS — Z992 Dependence on renal dialysis: Secondary | ICD-10-CM | POA: Diagnosis not present

## 2021-08-07 DIAGNOSIS — D631 Anemia in chronic kidney disease: Secondary | ICD-10-CM | POA: Diagnosis not present

## 2021-08-09 DIAGNOSIS — N186 End stage renal disease: Secondary | ICD-10-CM | POA: Diagnosis not present

## 2021-08-09 DIAGNOSIS — D509 Iron deficiency anemia, unspecified: Secondary | ICD-10-CM | POA: Diagnosis not present

## 2021-08-09 DIAGNOSIS — Z992 Dependence on renal dialysis: Secondary | ICD-10-CM | POA: Diagnosis not present

## 2021-08-09 DIAGNOSIS — E1122 Type 2 diabetes mellitus with diabetic chronic kidney disease: Secondary | ICD-10-CM | POA: Diagnosis not present

## 2021-08-09 DIAGNOSIS — N2581 Secondary hyperparathyroidism of renal origin: Secondary | ICD-10-CM | POA: Diagnosis not present

## 2021-08-09 DIAGNOSIS — D631 Anemia in chronic kidney disease: Secondary | ICD-10-CM | POA: Diagnosis not present

## 2021-08-11 DIAGNOSIS — D509 Iron deficiency anemia, unspecified: Secondary | ICD-10-CM | POA: Diagnosis not present

## 2021-08-11 DIAGNOSIS — N2581 Secondary hyperparathyroidism of renal origin: Secondary | ICD-10-CM | POA: Diagnosis not present

## 2021-08-11 DIAGNOSIS — N186 End stage renal disease: Secondary | ICD-10-CM | POA: Diagnosis not present

## 2021-08-11 DIAGNOSIS — E1122 Type 2 diabetes mellitus with diabetic chronic kidney disease: Secondary | ICD-10-CM | POA: Diagnosis not present

## 2021-08-11 DIAGNOSIS — D631 Anemia in chronic kidney disease: Secondary | ICD-10-CM | POA: Diagnosis not present

## 2021-08-11 DIAGNOSIS — Z992 Dependence on renal dialysis: Secondary | ICD-10-CM | POA: Diagnosis not present

## 2021-08-14 DIAGNOSIS — D509 Iron deficiency anemia, unspecified: Secondary | ICD-10-CM | POA: Diagnosis not present

## 2021-08-14 DIAGNOSIS — E1122 Type 2 diabetes mellitus with diabetic chronic kidney disease: Secondary | ICD-10-CM | POA: Diagnosis not present

## 2021-08-14 DIAGNOSIS — D631 Anemia in chronic kidney disease: Secondary | ICD-10-CM | POA: Diagnosis not present

## 2021-08-14 DIAGNOSIS — N2581 Secondary hyperparathyroidism of renal origin: Secondary | ICD-10-CM | POA: Diagnosis not present

## 2021-08-14 DIAGNOSIS — Z992 Dependence on renal dialysis: Secondary | ICD-10-CM | POA: Diagnosis not present

## 2021-08-14 DIAGNOSIS — N186 End stage renal disease: Secondary | ICD-10-CM | POA: Diagnosis not present

## 2021-08-16 DIAGNOSIS — Z992 Dependence on renal dialysis: Secondary | ICD-10-CM | POA: Diagnosis not present

## 2021-08-16 DIAGNOSIS — N2581 Secondary hyperparathyroidism of renal origin: Secondary | ICD-10-CM | POA: Diagnosis not present

## 2021-08-16 DIAGNOSIS — E1122 Type 2 diabetes mellitus with diabetic chronic kidney disease: Secondary | ICD-10-CM | POA: Diagnosis not present

## 2021-08-16 DIAGNOSIS — N186 End stage renal disease: Secondary | ICD-10-CM | POA: Diagnosis not present

## 2021-08-16 DIAGNOSIS — D509 Iron deficiency anemia, unspecified: Secondary | ICD-10-CM | POA: Diagnosis not present

## 2021-08-16 DIAGNOSIS — D631 Anemia in chronic kidney disease: Secondary | ICD-10-CM | POA: Diagnosis not present

## 2021-08-18 DIAGNOSIS — D631 Anemia in chronic kidney disease: Secondary | ICD-10-CM | POA: Diagnosis not present

## 2021-08-18 DIAGNOSIS — N186 End stage renal disease: Secondary | ICD-10-CM | POA: Diagnosis not present

## 2021-08-18 DIAGNOSIS — E1122 Type 2 diabetes mellitus with diabetic chronic kidney disease: Secondary | ICD-10-CM | POA: Diagnosis not present

## 2021-08-18 DIAGNOSIS — N2581 Secondary hyperparathyroidism of renal origin: Secondary | ICD-10-CM | POA: Diagnosis not present

## 2021-08-18 DIAGNOSIS — D509 Iron deficiency anemia, unspecified: Secondary | ICD-10-CM | POA: Diagnosis not present

## 2021-08-18 DIAGNOSIS — Z992 Dependence on renal dialysis: Secondary | ICD-10-CM | POA: Diagnosis not present

## 2021-08-21 DIAGNOSIS — N186 End stage renal disease: Secondary | ICD-10-CM | POA: Diagnosis not present

## 2021-08-21 DIAGNOSIS — D631 Anemia in chronic kidney disease: Secondary | ICD-10-CM | POA: Diagnosis not present

## 2021-08-21 DIAGNOSIS — D509 Iron deficiency anemia, unspecified: Secondary | ICD-10-CM | POA: Diagnosis not present

## 2021-08-21 DIAGNOSIS — Z992 Dependence on renal dialysis: Secondary | ICD-10-CM | POA: Diagnosis not present

## 2021-08-21 DIAGNOSIS — E1122 Type 2 diabetes mellitus with diabetic chronic kidney disease: Secondary | ICD-10-CM | POA: Diagnosis not present

## 2021-08-21 DIAGNOSIS — N2581 Secondary hyperparathyroidism of renal origin: Secondary | ICD-10-CM | POA: Diagnosis not present

## 2021-08-23 DIAGNOSIS — D631 Anemia in chronic kidney disease: Secondary | ICD-10-CM | POA: Diagnosis not present

## 2021-08-23 DIAGNOSIS — N2581 Secondary hyperparathyroidism of renal origin: Secondary | ICD-10-CM | POA: Diagnosis not present

## 2021-08-23 DIAGNOSIS — N186 End stage renal disease: Secondary | ICD-10-CM | POA: Diagnosis not present

## 2021-08-23 DIAGNOSIS — E1122 Type 2 diabetes mellitus with diabetic chronic kidney disease: Secondary | ICD-10-CM | POA: Diagnosis not present

## 2021-08-23 DIAGNOSIS — D509 Iron deficiency anemia, unspecified: Secondary | ICD-10-CM | POA: Diagnosis not present

## 2021-08-23 DIAGNOSIS — Z992 Dependence on renal dialysis: Secondary | ICD-10-CM | POA: Diagnosis not present

## 2021-08-25 DIAGNOSIS — N186 End stage renal disease: Secondary | ICD-10-CM | POA: Diagnosis not present

## 2021-08-25 DIAGNOSIS — N2581 Secondary hyperparathyroidism of renal origin: Secondary | ICD-10-CM | POA: Diagnosis not present

## 2021-08-25 DIAGNOSIS — D631 Anemia in chronic kidney disease: Secondary | ICD-10-CM | POA: Diagnosis not present

## 2021-08-25 DIAGNOSIS — D509 Iron deficiency anemia, unspecified: Secondary | ICD-10-CM | POA: Diagnosis not present

## 2021-08-25 DIAGNOSIS — Z992 Dependence on renal dialysis: Secondary | ICD-10-CM | POA: Diagnosis not present

## 2021-08-25 DIAGNOSIS — E1122 Type 2 diabetes mellitus with diabetic chronic kidney disease: Secondary | ICD-10-CM | POA: Diagnosis not present

## 2021-08-28 DIAGNOSIS — D509 Iron deficiency anemia, unspecified: Secondary | ICD-10-CM | POA: Diagnosis not present

## 2021-08-28 DIAGNOSIS — E875 Hyperkalemia: Secondary | ICD-10-CM | POA: Diagnosis not present

## 2021-08-28 DIAGNOSIS — N2581 Secondary hyperparathyroidism of renal origin: Secondary | ICD-10-CM | POA: Diagnosis not present

## 2021-08-28 DIAGNOSIS — E1122 Type 2 diabetes mellitus with diabetic chronic kidney disease: Secondary | ICD-10-CM | POA: Diagnosis not present

## 2021-08-28 DIAGNOSIS — N032 Chronic nephritic syndrome with diffuse membranous glomerulonephritis: Secondary | ICD-10-CM | POA: Diagnosis not present

## 2021-08-28 DIAGNOSIS — D631 Anemia in chronic kidney disease: Secondary | ICD-10-CM | POA: Diagnosis not present

## 2021-08-28 DIAGNOSIS — N186 End stage renal disease: Secondary | ICD-10-CM | POA: Diagnosis not present

## 2021-08-28 DIAGNOSIS — Z992 Dependence on renal dialysis: Secondary | ICD-10-CM | POA: Diagnosis not present

## 2021-08-30 ENCOUNTER — Other Ambulatory Visit: Payer: Self-pay | Admitting: Internal Medicine

## 2021-08-30 DIAGNOSIS — D509 Iron deficiency anemia, unspecified: Secondary | ICD-10-CM | POA: Diagnosis not present

## 2021-08-30 DIAGNOSIS — N2581 Secondary hyperparathyroidism of renal origin: Secondary | ICD-10-CM | POA: Diagnosis not present

## 2021-08-30 DIAGNOSIS — E875 Hyperkalemia: Secondary | ICD-10-CM | POA: Diagnosis not present

## 2021-08-30 DIAGNOSIS — N186 End stage renal disease: Secondary | ICD-10-CM | POA: Diagnosis not present

## 2021-08-30 DIAGNOSIS — Z992 Dependence on renal dialysis: Secondary | ICD-10-CM | POA: Diagnosis not present

## 2021-08-30 DIAGNOSIS — D631 Anemia in chronic kidney disease: Secondary | ICD-10-CM | POA: Diagnosis not present

## 2021-09-01 DIAGNOSIS — N186 End stage renal disease: Secondary | ICD-10-CM | POA: Diagnosis not present

## 2021-09-01 DIAGNOSIS — E875 Hyperkalemia: Secondary | ICD-10-CM | POA: Diagnosis not present

## 2021-09-01 DIAGNOSIS — N2581 Secondary hyperparathyroidism of renal origin: Secondary | ICD-10-CM | POA: Diagnosis not present

## 2021-09-01 DIAGNOSIS — D631 Anemia in chronic kidney disease: Secondary | ICD-10-CM | POA: Diagnosis not present

## 2021-09-01 DIAGNOSIS — D509 Iron deficiency anemia, unspecified: Secondary | ICD-10-CM | POA: Diagnosis not present

## 2021-09-01 DIAGNOSIS — Z992 Dependence on renal dialysis: Secondary | ICD-10-CM | POA: Diagnosis not present

## 2021-09-04 DIAGNOSIS — D631 Anemia in chronic kidney disease: Secondary | ICD-10-CM | POA: Diagnosis not present

## 2021-09-04 DIAGNOSIS — E875 Hyperkalemia: Secondary | ICD-10-CM | POA: Diagnosis not present

## 2021-09-04 DIAGNOSIS — D509 Iron deficiency anemia, unspecified: Secondary | ICD-10-CM | POA: Diagnosis not present

## 2021-09-04 DIAGNOSIS — Z992 Dependence on renal dialysis: Secondary | ICD-10-CM | POA: Diagnosis not present

## 2021-09-04 DIAGNOSIS — N186 End stage renal disease: Secondary | ICD-10-CM | POA: Diagnosis not present

## 2021-09-04 DIAGNOSIS — N2581 Secondary hyperparathyroidism of renal origin: Secondary | ICD-10-CM | POA: Diagnosis not present

## 2021-09-06 DIAGNOSIS — D509 Iron deficiency anemia, unspecified: Secondary | ICD-10-CM | POA: Diagnosis not present

## 2021-09-06 DIAGNOSIS — E875 Hyperkalemia: Secondary | ICD-10-CM | POA: Diagnosis not present

## 2021-09-06 DIAGNOSIS — N186 End stage renal disease: Secondary | ICD-10-CM | POA: Diagnosis not present

## 2021-09-06 DIAGNOSIS — N2581 Secondary hyperparathyroidism of renal origin: Secondary | ICD-10-CM | POA: Diagnosis not present

## 2021-09-06 DIAGNOSIS — D631 Anemia in chronic kidney disease: Secondary | ICD-10-CM | POA: Diagnosis not present

## 2021-09-06 DIAGNOSIS — Z992 Dependence on renal dialysis: Secondary | ICD-10-CM | POA: Diagnosis not present

## 2021-09-08 DIAGNOSIS — Z992 Dependence on renal dialysis: Secondary | ICD-10-CM | POA: Diagnosis not present

## 2021-09-08 DIAGNOSIS — D509 Iron deficiency anemia, unspecified: Secondary | ICD-10-CM | POA: Diagnosis not present

## 2021-09-08 DIAGNOSIS — E875 Hyperkalemia: Secondary | ICD-10-CM | POA: Diagnosis not present

## 2021-09-08 DIAGNOSIS — N186 End stage renal disease: Secondary | ICD-10-CM | POA: Diagnosis not present

## 2021-09-08 DIAGNOSIS — D631 Anemia in chronic kidney disease: Secondary | ICD-10-CM | POA: Diagnosis not present

## 2021-09-08 DIAGNOSIS — N2581 Secondary hyperparathyroidism of renal origin: Secondary | ICD-10-CM | POA: Diagnosis not present

## 2021-09-11 DIAGNOSIS — E875 Hyperkalemia: Secondary | ICD-10-CM | POA: Diagnosis not present

## 2021-09-11 DIAGNOSIS — D631 Anemia in chronic kidney disease: Secondary | ICD-10-CM | POA: Diagnosis not present

## 2021-09-11 DIAGNOSIS — N186 End stage renal disease: Secondary | ICD-10-CM | POA: Diagnosis not present

## 2021-09-11 DIAGNOSIS — Z992 Dependence on renal dialysis: Secondary | ICD-10-CM | POA: Diagnosis not present

## 2021-09-11 DIAGNOSIS — N2581 Secondary hyperparathyroidism of renal origin: Secondary | ICD-10-CM | POA: Diagnosis not present

## 2021-09-11 DIAGNOSIS — D509 Iron deficiency anemia, unspecified: Secondary | ICD-10-CM | POA: Diagnosis not present

## 2021-09-13 DIAGNOSIS — D631 Anemia in chronic kidney disease: Secondary | ICD-10-CM | POA: Diagnosis not present

## 2021-09-13 DIAGNOSIS — D509 Iron deficiency anemia, unspecified: Secondary | ICD-10-CM | POA: Diagnosis not present

## 2021-09-13 DIAGNOSIS — N2581 Secondary hyperparathyroidism of renal origin: Secondary | ICD-10-CM | POA: Diagnosis not present

## 2021-09-13 DIAGNOSIS — N186 End stage renal disease: Secondary | ICD-10-CM | POA: Diagnosis not present

## 2021-09-13 DIAGNOSIS — Z992 Dependence on renal dialysis: Secondary | ICD-10-CM | POA: Diagnosis not present

## 2021-09-13 DIAGNOSIS — E875 Hyperkalemia: Secondary | ICD-10-CM | POA: Diagnosis not present

## 2021-09-15 DIAGNOSIS — D631 Anemia in chronic kidney disease: Secondary | ICD-10-CM | POA: Diagnosis not present

## 2021-09-15 DIAGNOSIS — D509 Iron deficiency anemia, unspecified: Secondary | ICD-10-CM | POA: Diagnosis not present

## 2021-09-15 DIAGNOSIS — E875 Hyperkalemia: Secondary | ICD-10-CM | POA: Diagnosis not present

## 2021-09-15 DIAGNOSIS — Z992 Dependence on renal dialysis: Secondary | ICD-10-CM | POA: Diagnosis not present

## 2021-09-15 DIAGNOSIS — N186 End stage renal disease: Secondary | ICD-10-CM | POA: Diagnosis not present

## 2021-09-15 DIAGNOSIS — N2581 Secondary hyperparathyroidism of renal origin: Secondary | ICD-10-CM | POA: Diagnosis not present

## 2021-09-18 DIAGNOSIS — Z992 Dependence on renal dialysis: Secondary | ICD-10-CM | POA: Diagnosis not present

## 2021-09-18 DIAGNOSIS — N186 End stage renal disease: Secondary | ICD-10-CM | POA: Diagnosis not present

## 2021-09-18 DIAGNOSIS — E875 Hyperkalemia: Secondary | ICD-10-CM | POA: Diagnosis not present

## 2021-09-18 DIAGNOSIS — N2581 Secondary hyperparathyroidism of renal origin: Secondary | ICD-10-CM | POA: Diagnosis not present

## 2021-09-18 DIAGNOSIS — D509 Iron deficiency anemia, unspecified: Secondary | ICD-10-CM | POA: Diagnosis not present

## 2021-09-18 DIAGNOSIS — D631 Anemia in chronic kidney disease: Secondary | ICD-10-CM | POA: Diagnosis not present

## 2021-09-21 DIAGNOSIS — D509 Iron deficiency anemia, unspecified: Secondary | ICD-10-CM | POA: Diagnosis not present

## 2021-09-21 DIAGNOSIS — N2581 Secondary hyperparathyroidism of renal origin: Secondary | ICD-10-CM | POA: Diagnosis not present

## 2021-09-21 DIAGNOSIS — N186 End stage renal disease: Secondary | ICD-10-CM | POA: Diagnosis not present

## 2021-09-21 DIAGNOSIS — Z992 Dependence on renal dialysis: Secondary | ICD-10-CM | POA: Diagnosis not present

## 2021-09-21 DIAGNOSIS — D631 Anemia in chronic kidney disease: Secondary | ICD-10-CM | POA: Diagnosis not present

## 2021-09-21 DIAGNOSIS — E875 Hyperkalemia: Secondary | ICD-10-CM | POA: Diagnosis not present

## 2021-09-23 DIAGNOSIS — D509 Iron deficiency anemia, unspecified: Secondary | ICD-10-CM | POA: Diagnosis not present

## 2021-09-23 DIAGNOSIS — E875 Hyperkalemia: Secondary | ICD-10-CM | POA: Diagnosis not present

## 2021-09-23 DIAGNOSIS — N186 End stage renal disease: Secondary | ICD-10-CM | POA: Diagnosis not present

## 2021-09-23 DIAGNOSIS — D631 Anemia in chronic kidney disease: Secondary | ICD-10-CM | POA: Diagnosis not present

## 2021-09-23 DIAGNOSIS — Z992 Dependence on renal dialysis: Secondary | ICD-10-CM | POA: Diagnosis not present

## 2021-09-23 DIAGNOSIS — N2581 Secondary hyperparathyroidism of renal origin: Secondary | ICD-10-CM | POA: Diagnosis not present

## 2021-09-25 DIAGNOSIS — E875 Hyperkalemia: Secondary | ICD-10-CM | POA: Diagnosis not present

## 2021-09-25 DIAGNOSIS — N186 End stage renal disease: Secondary | ICD-10-CM | POA: Diagnosis not present

## 2021-09-25 DIAGNOSIS — N2581 Secondary hyperparathyroidism of renal origin: Secondary | ICD-10-CM | POA: Diagnosis not present

## 2021-09-25 DIAGNOSIS — D631 Anemia in chronic kidney disease: Secondary | ICD-10-CM | POA: Diagnosis not present

## 2021-09-25 DIAGNOSIS — D509 Iron deficiency anemia, unspecified: Secondary | ICD-10-CM | POA: Diagnosis not present

## 2021-09-25 DIAGNOSIS — Z992 Dependence on renal dialysis: Secondary | ICD-10-CM | POA: Diagnosis not present

## 2021-09-27 DIAGNOSIS — N2581 Secondary hyperparathyroidism of renal origin: Secondary | ICD-10-CM | POA: Diagnosis not present

## 2021-09-27 DIAGNOSIS — Z992 Dependence on renal dialysis: Secondary | ICD-10-CM | POA: Diagnosis not present

## 2021-09-27 DIAGNOSIS — N032 Chronic nephritic syndrome with diffuse membranous glomerulonephritis: Secondary | ICD-10-CM | POA: Diagnosis not present

## 2021-09-27 DIAGNOSIS — D509 Iron deficiency anemia, unspecified: Secondary | ICD-10-CM | POA: Diagnosis not present

## 2021-09-27 DIAGNOSIS — D631 Anemia in chronic kidney disease: Secondary | ICD-10-CM | POA: Diagnosis not present

## 2021-09-27 DIAGNOSIS — N186 End stage renal disease: Secondary | ICD-10-CM | POA: Diagnosis not present

## 2021-09-27 DIAGNOSIS — E1122 Type 2 diabetes mellitus with diabetic chronic kidney disease: Secondary | ICD-10-CM | POA: Diagnosis not present

## 2021-09-27 DIAGNOSIS — E875 Hyperkalemia: Secondary | ICD-10-CM | POA: Diagnosis not present

## 2021-09-29 DIAGNOSIS — D631 Anemia in chronic kidney disease: Secondary | ICD-10-CM | POA: Diagnosis not present

## 2021-09-29 DIAGNOSIS — N2581 Secondary hyperparathyroidism of renal origin: Secondary | ICD-10-CM | POA: Diagnosis not present

## 2021-09-29 DIAGNOSIS — D509 Iron deficiency anemia, unspecified: Secondary | ICD-10-CM | POA: Diagnosis not present

## 2021-09-29 DIAGNOSIS — E875 Hyperkalemia: Secondary | ICD-10-CM | POA: Diagnosis not present

## 2021-09-29 DIAGNOSIS — Z992 Dependence on renal dialysis: Secondary | ICD-10-CM | POA: Diagnosis not present

## 2021-09-29 DIAGNOSIS — N186 End stage renal disease: Secondary | ICD-10-CM | POA: Diagnosis not present

## 2021-10-02 DIAGNOSIS — D631 Anemia in chronic kidney disease: Secondary | ICD-10-CM | POA: Diagnosis not present

## 2021-10-02 DIAGNOSIS — D509 Iron deficiency anemia, unspecified: Secondary | ICD-10-CM | POA: Diagnosis not present

## 2021-10-02 DIAGNOSIS — N186 End stage renal disease: Secondary | ICD-10-CM | POA: Diagnosis not present

## 2021-10-02 DIAGNOSIS — N2581 Secondary hyperparathyroidism of renal origin: Secondary | ICD-10-CM | POA: Diagnosis not present

## 2021-10-02 DIAGNOSIS — E875 Hyperkalemia: Secondary | ICD-10-CM | POA: Diagnosis not present

## 2021-10-02 DIAGNOSIS — Z992 Dependence on renal dialysis: Secondary | ICD-10-CM | POA: Diagnosis not present

## 2021-10-04 DIAGNOSIS — Z992 Dependence on renal dialysis: Secondary | ICD-10-CM | POA: Diagnosis not present

## 2021-10-04 DIAGNOSIS — D509 Iron deficiency anemia, unspecified: Secondary | ICD-10-CM | POA: Diagnosis not present

## 2021-10-04 DIAGNOSIS — E875 Hyperkalemia: Secondary | ICD-10-CM | POA: Diagnosis not present

## 2021-10-04 DIAGNOSIS — N2581 Secondary hyperparathyroidism of renal origin: Secondary | ICD-10-CM | POA: Diagnosis not present

## 2021-10-04 DIAGNOSIS — D631 Anemia in chronic kidney disease: Secondary | ICD-10-CM | POA: Diagnosis not present

## 2021-10-04 DIAGNOSIS — N186 End stage renal disease: Secondary | ICD-10-CM | POA: Diagnosis not present

## 2021-10-06 DIAGNOSIS — N2581 Secondary hyperparathyroidism of renal origin: Secondary | ICD-10-CM | POA: Diagnosis not present

## 2021-10-06 DIAGNOSIS — E875 Hyperkalemia: Secondary | ICD-10-CM | POA: Diagnosis not present

## 2021-10-06 DIAGNOSIS — Z992 Dependence on renal dialysis: Secondary | ICD-10-CM | POA: Diagnosis not present

## 2021-10-06 DIAGNOSIS — D509 Iron deficiency anemia, unspecified: Secondary | ICD-10-CM | POA: Diagnosis not present

## 2021-10-06 DIAGNOSIS — N186 End stage renal disease: Secondary | ICD-10-CM | POA: Diagnosis not present

## 2021-10-06 DIAGNOSIS — D631 Anemia in chronic kidney disease: Secondary | ICD-10-CM | POA: Diagnosis not present

## 2021-10-09 DIAGNOSIS — D631 Anemia in chronic kidney disease: Secondary | ICD-10-CM | POA: Diagnosis not present

## 2021-10-09 DIAGNOSIS — D509 Iron deficiency anemia, unspecified: Secondary | ICD-10-CM | POA: Diagnosis not present

## 2021-10-09 DIAGNOSIS — N2581 Secondary hyperparathyroidism of renal origin: Secondary | ICD-10-CM | POA: Diagnosis not present

## 2021-10-09 DIAGNOSIS — E875 Hyperkalemia: Secondary | ICD-10-CM | POA: Diagnosis not present

## 2021-10-09 DIAGNOSIS — N186 End stage renal disease: Secondary | ICD-10-CM | POA: Diagnosis not present

## 2021-10-09 DIAGNOSIS — Z992 Dependence on renal dialysis: Secondary | ICD-10-CM | POA: Diagnosis not present

## 2021-10-11 ENCOUNTER — Ambulatory Visit (INDEPENDENT_AMBULATORY_CARE_PROVIDER_SITE_OTHER): Payer: Medicare Other

## 2021-10-11 DIAGNOSIS — D509 Iron deficiency anemia, unspecified: Secondary | ICD-10-CM | POA: Diagnosis not present

## 2021-10-11 DIAGNOSIS — Z992 Dependence on renal dialysis: Secondary | ICD-10-CM | POA: Diagnosis not present

## 2021-10-11 DIAGNOSIS — I10 Essential (primary) hypertension: Secondary | ICD-10-CM | POA: Diagnosis not present

## 2021-10-11 DIAGNOSIS — D631 Anemia in chronic kidney disease: Secondary | ICD-10-CM | POA: Diagnosis not present

## 2021-10-11 DIAGNOSIS — I255 Ischemic cardiomyopathy: Secondary | ICD-10-CM

## 2021-10-11 DIAGNOSIS — N186 End stage renal disease: Secondary | ICD-10-CM | POA: Diagnosis not present

## 2021-10-11 DIAGNOSIS — E875 Hyperkalemia: Secondary | ICD-10-CM | POA: Diagnosis not present

## 2021-10-11 DIAGNOSIS — N2581 Secondary hyperparathyroidism of renal origin: Secondary | ICD-10-CM | POA: Diagnosis not present

## 2021-10-11 DIAGNOSIS — M25552 Pain in left hip: Secondary | ICD-10-CM | POA: Diagnosis not present

## 2021-10-11 DIAGNOSIS — M7062 Trochanteric bursitis, left hip: Secondary | ICD-10-CM | POA: Diagnosis not present

## 2021-10-12 LAB — CUP PACEART REMOTE DEVICE CHECK
Battery Remaining Longevity: 85 mo
Battery Voltage: 2.98 V
Brady Statistic RV Percent Paced: 0.02 %
Date Time Interrogation Session: 20221216103726
HighPow Impedance: 254 Ohm
HighPow Impedance: 60 Ohm
Implantable Lead Implant Date: 20210315
Implantable Lead Location: 753860
Implantable Lead Model: 6935
Implantable Pulse Generator Implant Date: 20190208
Lead Channel Impedance Value: 304 Ohm
Lead Channel Impedance Value: 418 Ohm
Lead Channel Pacing Threshold Amplitude: 0.75 V
Lead Channel Pacing Threshold Pulse Width: 0.4 ms
Lead Channel Sensing Intrinsic Amplitude: 10 mV
Lead Channel Sensing Intrinsic Amplitude: 10 mV
Lead Channel Setting Pacing Amplitude: 2 V
Lead Channel Setting Pacing Pulse Width: 0.4 ms
Lead Channel Setting Sensing Sensitivity: 0.3 mV

## 2021-10-13 DIAGNOSIS — D509 Iron deficiency anemia, unspecified: Secondary | ICD-10-CM | POA: Diagnosis not present

## 2021-10-13 DIAGNOSIS — Z992 Dependence on renal dialysis: Secondary | ICD-10-CM | POA: Diagnosis not present

## 2021-10-13 DIAGNOSIS — D631 Anemia in chronic kidney disease: Secondary | ICD-10-CM | POA: Diagnosis not present

## 2021-10-13 DIAGNOSIS — N2581 Secondary hyperparathyroidism of renal origin: Secondary | ICD-10-CM | POA: Diagnosis not present

## 2021-10-13 DIAGNOSIS — E875 Hyperkalemia: Secondary | ICD-10-CM | POA: Diagnosis not present

## 2021-10-13 DIAGNOSIS — N186 End stage renal disease: Secondary | ICD-10-CM | POA: Diagnosis not present

## 2021-10-16 DIAGNOSIS — N186 End stage renal disease: Secondary | ICD-10-CM | POA: Diagnosis not present

## 2021-10-16 DIAGNOSIS — N2581 Secondary hyperparathyroidism of renal origin: Secondary | ICD-10-CM | POA: Diagnosis not present

## 2021-10-16 DIAGNOSIS — D509 Iron deficiency anemia, unspecified: Secondary | ICD-10-CM | POA: Diagnosis not present

## 2021-10-16 DIAGNOSIS — Z992 Dependence on renal dialysis: Secondary | ICD-10-CM | POA: Diagnosis not present

## 2021-10-16 DIAGNOSIS — E875 Hyperkalemia: Secondary | ICD-10-CM | POA: Diagnosis not present

## 2021-10-16 DIAGNOSIS — D631 Anemia in chronic kidney disease: Secondary | ICD-10-CM | POA: Diagnosis not present

## 2021-10-17 DIAGNOSIS — M3130 Wegener's granulomatosis without renal involvement: Secondary | ICD-10-CM | POA: Diagnosis not present

## 2021-10-17 DIAGNOSIS — Z79620 Long term (current) use of immunosuppressive biologic: Secondary | ICD-10-CM | POA: Diagnosis not present

## 2021-10-17 DIAGNOSIS — Z872 Personal history of diseases of the skin and subcutaneous tissue: Secondary | ICD-10-CM | POA: Diagnosis not present

## 2021-10-17 DIAGNOSIS — I7782 Antineutrophilic cytoplasmic antibody (ANCA) vasculitis: Secondary | ICD-10-CM | POA: Diagnosis not present

## 2021-10-17 DIAGNOSIS — Z5181 Encounter for therapeutic drug level monitoring: Secondary | ICD-10-CM | POA: Diagnosis not present

## 2021-10-17 DIAGNOSIS — Z8615 Personal history of latent tuberculosis infection: Secondary | ICD-10-CM | POA: Diagnosis not present

## 2021-10-17 DIAGNOSIS — Z9889 Other specified postprocedural states: Secondary | ICD-10-CM | POA: Diagnosis not present

## 2021-10-18 DIAGNOSIS — N2581 Secondary hyperparathyroidism of renal origin: Secondary | ICD-10-CM | POA: Diagnosis not present

## 2021-10-18 DIAGNOSIS — D631 Anemia in chronic kidney disease: Secondary | ICD-10-CM | POA: Diagnosis not present

## 2021-10-18 DIAGNOSIS — E875 Hyperkalemia: Secondary | ICD-10-CM | POA: Diagnosis not present

## 2021-10-18 DIAGNOSIS — N186 End stage renal disease: Secondary | ICD-10-CM | POA: Diagnosis not present

## 2021-10-18 DIAGNOSIS — Z992 Dependence on renal dialysis: Secondary | ICD-10-CM | POA: Diagnosis not present

## 2021-10-18 DIAGNOSIS — D509 Iron deficiency anemia, unspecified: Secondary | ICD-10-CM | POA: Diagnosis not present

## 2021-10-20 DIAGNOSIS — Z992 Dependence on renal dialysis: Secondary | ICD-10-CM | POA: Diagnosis not present

## 2021-10-20 DIAGNOSIS — E875 Hyperkalemia: Secondary | ICD-10-CM | POA: Diagnosis not present

## 2021-10-20 DIAGNOSIS — N2581 Secondary hyperparathyroidism of renal origin: Secondary | ICD-10-CM | POA: Diagnosis not present

## 2021-10-20 DIAGNOSIS — N186 End stage renal disease: Secondary | ICD-10-CM | POA: Diagnosis not present

## 2021-10-20 DIAGNOSIS — D509 Iron deficiency anemia, unspecified: Secondary | ICD-10-CM | POA: Diagnosis not present

## 2021-10-20 DIAGNOSIS — D631 Anemia in chronic kidney disease: Secondary | ICD-10-CM | POA: Diagnosis not present

## 2021-10-23 DIAGNOSIS — N2581 Secondary hyperparathyroidism of renal origin: Secondary | ICD-10-CM | POA: Diagnosis not present

## 2021-10-23 DIAGNOSIS — N186 End stage renal disease: Secondary | ICD-10-CM | POA: Diagnosis not present

## 2021-10-23 DIAGNOSIS — Z992 Dependence on renal dialysis: Secondary | ICD-10-CM | POA: Diagnosis not present

## 2021-10-23 DIAGNOSIS — D509 Iron deficiency anemia, unspecified: Secondary | ICD-10-CM | POA: Diagnosis not present

## 2021-10-23 DIAGNOSIS — D631 Anemia in chronic kidney disease: Secondary | ICD-10-CM | POA: Diagnosis not present

## 2021-10-23 DIAGNOSIS — E875 Hyperkalemia: Secondary | ICD-10-CM | POA: Diagnosis not present

## 2021-10-23 NOTE — Progress Notes (Signed)
Remote ICD transmission.   

## 2021-10-25 DIAGNOSIS — N2581 Secondary hyperparathyroidism of renal origin: Secondary | ICD-10-CM | POA: Diagnosis not present

## 2021-10-25 DIAGNOSIS — D631 Anemia in chronic kidney disease: Secondary | ICD-10-CM | POA: Diagnosis not present

## 2021-10-25 DIAGNOSIS — Z992 Dependence on renal dialysis: Secondary | ICD-10-CM | POA: Diagnosis not present

## 2021-10-25 DIAGNOSIS — N186 End stage renal disease: Secondary | ICD-10-CM | POA: Diagnosis not present

## 2021-10-25 DIAGNOSIS — D509 Iron deficiency anemia, unspecified: Secondary | ICD-10-CM | POA: Diagnosis not present

## 2021-10-25 DIAGNOSIS — E875 Hyperkalemia: Secondary | ICD-10-CM | POA: Diagnosis not present

## 2021-10-27 DIAGNOSIS — N186 End stage renal disease: Secondary | ICD-10-CM | POA: Diagnosis not present

## 2021-10-27 DIAGNOSIS — D631 Anemia in chronic kidney disease: Secondary | ICD-10-CM | POA: Diagnosis not present

## 2021-10-27 DIAGNOSIS — D509 Iron deficiency anemia, unspecified: Secondary | ICD-10-CM | POA: Diagnosis not present

## 2021-10-27 DIAGNOSIS — Z992 Dependence on renal dialysis: Secondary | ICD-10-CM | POA: Diagnosis not present

## 2021-10-27 DIAGNOSIS — N2581 Secondary hyperparathyroidism of renal origin: Secondary | ICD-10-CM | POA: Diagnosis not present

## 2021-10-27 DIAGNOSIS — E875 Hyperkalemia: Secondary | ICD-10-CM | POA: Diagnosis not present

## 2021-10-28 DIAGNOSIS — N032 Chronic nephritic syndrome with diffuse membranous glomerulonephritis: Secondary | ICD-10-CM | POA: Diagnosis not present

## 2021-10-28 DIAGNOSIS — Z992 Dependence on renal dialysis: Secondary | ICD-10-CM | POA: Diagnosis not present

## 2021-10-28 DIAGNOSIS — N186 End stage renal disease: Secondary | ICD-10-CM | POA: Diagnosis not present

## 2021-10-30 DIAGNOSIS — E1122 Type 2 diabetes mellitus with diabetic chronic kidney disease: Secondary | ICD-10-CM | POA: Diagnosis not present

## 2021-10-30 DIAGNOSIS — N2581 Secondary hyperparathyroidism of renal origin: Secondary | ICD-10-CM | POA: Diagnosis not present

## 2021-10-30 DIAGNOSIS — N186 End stage renal disease: Secondary | ICD-10-CM | POA: Diagnosis not present

## 2021-10-30 DIAGNOSIS — Z992 Dependence on renal dialysis: Secondary | ICD-10-CM | POA: Diagnosis not present

## 2021-10-30 DIAGNOSIS — D509 Iron deficiency anemia, unspecified: Secondary | ICD-10-CM | POA: Diagnosis not present

## 2021-10-30 DIAGNOSIS — E875 Hyperkalemia: Secondary | ICD-10-CM | POA: Diagnosis not present

## 2021-10-30 DIAGNOSIS — D631 Anemia in chronic kidney disease: Secondary | ICD-10-CM | POA: Diagnosis not present

## 2021-10-31 DIAGNOSIS — I7782 Antineutrophilic cytoplasmic antibody (ANCA) vasculitis: Secondary | ICD-10-CM | POA: Diagnosis not present

## 2021-10-31 DIAGNOSIS — D71 Functional disorders of polymorphonuclear neutrophils: Secondary | ICD-10-CM | POA: Diagnosis not present

## 2021-10-31 DIAGNOSIS — H0520 Unspecified exophthalmos: Secondary | ICD-10-CM | POA: Diagnosis not present

## 2021-10-31 DIAGNOSIS — M3130 Wegener's granulomatosis without renal involvement: Secondary | ICD-10-CM | POA: Diagnosis not present

## 2021-10-31 DIAGNOSIS — Z872 Personal history of diseases of the skin and subcutaneous tissue: Secondary | ICD-10-CM | POA: Diagnosis not present

## 2021-10-31 DIAGNOSIS — R220 Localized swelling, mass and lump, head: Secondary | ICD-10-CM | POA: Diagnosis not present

## 2021-11-01 DIAGNOSIS — D631 Anemia in chronic kidney disease: Secondary | ICD-10-CM | POA: Diagnosis not present

## 2021-11-01 DIAGNOSIS — Z992 Dependence on renal dialysis: Secondary | ICD-10-CM | POA: Diagnosis not present

## 2021-11-01 DIAGNOSIS — N2581 Secondary hyperparathyroidism of renal origin: Secondary | ICD-10-CM | POA: Diagnosis not present

## 2021-11-01 DIAGNOSIS — E875 Hyperkalemia: Secondary | ICD-10-CM | POA: Diagnosis not present

## 2021-11-01 DIAGNOSIS — D509 Iron deficiency anemia, unspecified: Secondary | ICD-10-CM | POA: Diagnosis not present

## 2021-11-01 DIAGNOSIS — N186 End stage renal disease: Secondary | ICD-10-CM | POA: Diagnosis not present

## 2021-11-03 DIAGNOSIS — N2581 Secondary hyperparathyroidism of renal origin: Secondary | ICD-10-CM | POA: Diagnosis not present

## 2021-11-03 DIAGNOSIS — E875 Hyperkalemia: Secondary | ICD-10-CM | POA: Diagnosis not present

## 2021-11-03 DIAGNOSIS — D509 Iron deficiency anemia, unspecified: Secondary | ICD-10-CM | POA: Diagnosis not present

## 2021-11-03 DIAGNOSIS — Z992 Dependence on renal dialysis: Secondary | ICD-10-CM | POA: Diagnosis not present

## 2021-11-03 DIAGNOSIS — D631 Anemia in chronic kidney disease: Secondary | ICD-10-CM | POA: Diagnosis not present

## 2021-11-03 DIAGNOSIS — N186 End stage renal disease: Secondary | ICD-10-CM | POA: Diagnosis not present

## 2021-11-06 DIAGNOSIS — D509 Iron deficiency anemia, unspecified: Secondary | ICD-10-CM | POA: Diagnosis not present

## 2021-11-06 DIAGNOSIS — E875 Hyperkalemia: Secondary | ICD-10-CM | POA: Diagnosis not present

## 2021-11-06 DIAGNOSIS — N186 End stage renal disease: Secondary | ICD-10-CM | POA: Diagnosis not present

## 2021-11-06 DIAGNOSIS — D631 Anemia in chronic kidney disease: Secondary | ICD-10-CM | POA: Diagnosis not present

## 2021-11-06 DIAGNOSIS — Z992 Dependence on renal dialysis: Secondary | ICD-10-CM | POA: Diagnosis not present

## 2021-11-06 DIAGNOSIS — N2581 Secondary hyperparathyroidism of renal origin: Secondary | ICD-10-CM | POA: Diagnosis not present

## 2021-11-08 DIAGNOSIS — Z992 Dependence on renal dialysis: Secondary | ICD-10-CM | POA: Diagnosis not present

## 2021-11-08 DIAGNOSIS — N186 End stage renal disease: Secondary | ICD-10-CM | POA: Diagnosis not present

## 2021-11-08 DIAGNOSIS — D509 Iron deficiency anemia, unspecified: Secondary | ICD-10-CM | POA: Diagnosis not present

## 2021-11-08 DIAGNOSIS — E875 Hyperkalemia: Secondary | ICD-10-CM | POA: Diagnosis not present

## 2021-11-08 DIAGNOSIS — N2581 Secondary hyperparathyroidism of renal origin: Secondary | ICD-10-CM | POA: Diagnosis not present

## 2021-11-08 DIAGNOSIS — D631 Anemia in chronic kidney disease: Secondary | ICD-10-CM | POA: Diagnosis not present

## 2021-11-10 DIAGNOSIS — D509 Iron deficiency anemia, unspecified: Secondary | ICD-10-CM | POA: Diagnosis not present

## 2021-11-10 DIAGNOSIS — Z992 Dependence on renal dialysis: Secondary | ICD-10-CM | POA: Diagnosis not present

## 2021-11-10 DIAGNOSIS — N2581 Secondary hyperparathyroidism of renal origin: Secondary | ICD-10-CM | POA: Diagnosis not present

## 2021-11-10 DIAGNOSIS — E875 Hyperkalemia: Secondary | ICD-10-CM | POA: Diagnosis not present

## 2021-11-10 DIAGNOSIS — D631 Anemia in chronic kidney disease: Secondary | ICD-10-CM | POA: Diagnosis not present

## 2021-11-10 DIAGNOSIS — N186 End stage renal disease: Secondary | ICD-10-CM | POA: Diagnosis not present

## 2021-11-13 DIAGNOSIS — E875 Hyperkalemia: Secondary | ICD-10-CM | POA: Diagnosis not present

## 2021-11-13 DIAGNOSIS — D631 Anemia in chronic kidney disease: Secondary | ICD-10-CM | POA: Diagnosis not present

## 2021-11-13 DIAGNOSIS — Z992 Dependence on renal dialysis: Secondary | ICD-10-CM | POA: Diagnosis not present

## 2021-11-13 DIAGNOSIS — N2581 Secondary hyperparathyroidism of renal origin: Secondary | ICD-10-CM | POA: Diagnosis not present

## 2021-11-13 DIAGNOSIS — N186 End stage renal disease: Secondary | ICD-10-CM | POA: Diagnosis not present

## 2021-11-13 DIAGNOSIS — D509 Iron deficiency anemia, unspecified: Secondary | ICD-10-CM | POA: Diagnosis not present

## 2021-11-15 DIAGNOSIS — E1122 Type 2 diabetes mellitus with diabetic chronic kidney disease: Secondary | ICD-10-CM | POA: Diagnosis not present

## 2021-11-15 DIAGNOSIS — D509 Iron deficiency anemia, unspecified: Secondary | ICD-10-CM | POA: Diagnosis not present

## 2021-11-15 DIAGNOSIS — N186 End stage renal disease: Secondary | ICD-10-CM | POA: Diagnosis not present

## 2021-11-15 DIAGNOSIS — D631 Anemia in chronic kidney disease: Secondary | ICD-10-CM | POA: Diagnosis not present

## 2021-11-15 DIAGNOSIS — N2581 Secondary hyperparathyroidism of renal origin: Secondary | ICD-10-CM | POA: Diagnosis not present

## 2021-11-15 DIAGNOSIS — Z992 Dependence on renal dialysis: Secondary | ICD-10-CM | POA: Diagnosis not present

## 2021-11-15 DIAGNOSIS — E875 Hyperkalemia: Secondary | ICD-10-CM | POA: Diagnosis not present

## 2021-11-17 DIAGNOSIS — N186 End stage renal disease: Secondary | ICD-10-CM | POA: Diagnosis not present

## 2021-11-17 DIAGNOSIS — D509 Iron deficiency anemia, unspecified: Secondary | ICD-10-CM | POA: Diagnosis not present

## 2021-11-17 DIAGNOSIS — N2581 Secondary hyperparathyroidism of renal origin: Secondary | ICD-10-CM | POA: Diagnosis not present

## 2021-11-17 DIAGNOSIS — E875 Hyperkalemia: Secondary | ICD-10-CM | POA: Diagnosis not present

## 2021-11-17 DIAGNOSIS — Z992 Dependence on renal dialysis: Secondary | ICD-10-CM | POA: Diagnosis not present

## 2021-11-17 DIAGNOSIS — D631 Anemia in chronic kidney disease: Secondary | ICD-10-CM | POA: Diagnosis not present

## 2021-11-20 DIAGNOSIS — Z992 Dependence on renal dialysis: Secondary | ICD-10-CM | POA: Diagnosis not present

## 2021-11-20 DIAGNOSIS — D509 Iron deficiency anemia, unspecified: Secondary | ICD-10-CM | POA: Diagnosis not present

## 2021-11-20 DIAGNOSIS — D631 Anemia in chronic kidney disease: Secondary | ICD-10-CM | POA: Diagnosis not present

## 2021-11-20 DIAGNOSIS — N186 End stage renal disease: Secondary | ICD-10-CM | POA: Diagnosis not present

## 2021-11-20 DIAGNOSIS — N2581 Secondary hyperparathyroidism of renal origin: Secondary | ICD-10-CM | POA: Diagnosis not present

## 2021-11-20 DIAGNOSIS — E875 Hyperkalemia: Secondary | ICD-10-CM | POA: Diagnosis not present

## 2021-11-22 DIAGNOSIS — N186 End stage renal disease: Secondary | ICD-10-CM | POA: Diagnosis not present

## 2021-11-22 DIAGNOSIS — E875 Hyperkalemia: Secondary | ICD-10-CM | POA: Diagnosis not present

## 2021-11-22 DIAGNOSIS — D631 Anemia in chronic kidney disease: Secondary | ICD-10-CM | POA: Diagnosis not present

## 2021-11-22 DIAGNOSIS — Z992 Dependence on renal dialysis: Secondary | ICD-10-CM | POA: Diagnosis not present

## 2021-11-22 DIAGNOSIS — D509 Iron deficiency anemia, unspecified: Secondary | ICD-10-CM | POA: Diagnosis not present

## 2021-11-22 DIAGNOSIS — N2581 Secondary hyperparathyroidism of renal origin: Secondary | ICD-10-CM | POA: Diagnosis not present

## 2021-11-24 DIAGNOSIS — N186 End stage renal disease: Secondary | ICD-10-CM | POA: Diagnosis not present

## 2021-11-24 DIAGNOSIS — D509 Iron deficiency anemia, unspecified: Secondary | ICD-10-CM | POA: Diagnosis not present

## 2021-11-24 DIAGNOSIS — Z992 Dependence on renal dialysis: Secondary | ICD-10-CM | POA: Diagnosis not present

## 2021-11-24 DIAGNOSIS — D631 Anemia in chronic kidney disease: Secondary | ICD-10-CM | POA: Diagnosis not present

## 2021-11-24 DIAGNOSIS — N2581 Secondary hyperparathyroidism of renal origin: Secondary | ICD-10-CM | POA: Diagnosis not present

## 2021-11-24 DIAGNOSIS — E875 Hyperkalemia: Secondary | ICD-10-CM | POA: Diagnosis not present

## 2021-11-26 ENCOUNTER — Other Ambulatory Visit: Payer: Self-pay | Admitting: Internal Medicine

## 2021-11-27 DIAGNOSIS — N186 End stage renal disease: Secondary | ICD-10-CM | POA: Diagnosis not present

## 2021-11-27 DIAGNOSIS — Z992 Dependence on renal dialysis: Secondary | ICD-10-CM | POA: Diagnosis not present

## 2021-11-27 DIAGNOSIS — D509 Iron deficiency anemia, unspecified: Secondary | ICD-10-CM | POA: Diagnosis not present

## 2021-11-27 DIAGNOSIS — N2581 Secondary hyperparathyroidism of renal origin: Secondary | ICD-10-CM | POA: Diagnosis not present

## 2021-11-27 DIAGNOSIS — D631 Anemia in chronic kidney disease: Secondary | ICD-10-CM | POA: Diagnosis not present

## 2021-11-27 DIAGNOSIS — E875 Hyperkalemia: Secondary | ICD-10-CM | POA: Diagnosis not present

## 2021-11-28 DIAGNOSIS — Z992 Dependence on renal dialysis: Secondary | ICD-10-CM | POA: Diagnosis not present

## 2021-11-28 DIAGNOSIS — N186 End stage renal disease: Secondary | ICD-10-CM | POA: Diagnosis not present

## 2021-11-28 DIAGNOSIS — N032 Chronic nephritic syndrome with diffuse membranous glomerulonephritis: Secondary | ICD-10-CM | POA: Diagnosis not present

## 2021-11-29 DIAGNOSIS — N2581 Secondary hyperparathyroidism of renal origin: Secondary | ICD-10-CM | POA: Diagnosis not present

## 2021-11-29 DIAGNOSIS — D631 Anemia in chronic kidney disease: Secondary | ICD-10-CM | POA: Diagnosis not present

## 2021-11-29 DIAGNOSIS — Z992 Dependence on renal dialysis: Secondary | ICD-10-CM | POA: Diagnosis not present

## 2021-11-29 DIAGNOSIS — E875 Hyperkalemia: Secondary | ICD-10-CM | POA: Diagnosis not present

## 2021-11-29 DIAGNOSIS — E1122 Type 2 diabetes mellitus with diabetic chronic kidney disease: Secondary | ICD-10-CM | POA: Diagnosis not present

## 2021-11-29 DIAGNOSIS — N186 End stage renal disease: Secondary | ICD-10-CM | POA: Diagnosis not present

## 2021-11-29 DIAGNOSIS — D509 Iron deficiency anemia, unspecified: Secondary | ICD-10-CM | POA: Diagnosis not present

## 2021-12-01 DIAGNOSIS — N186 End stage renal disease: Secondary | ICD-10-CM | POA: Diagnosis not present

## 2021-12-01 DIAGNOSIS — N2581 Secondary hyperparathyroidism of renal origin: Secondary | ICD-10-CM | POA: Diagnosis not present

## 2021-12-01 DIAGNOSIS — D509 Iron deficiency anemia, unspecified: Secondary | ICD-10-CM | POA: Diagnosis not present

## 2021-12-01 DIAGNOSIS — Z992 Dependence on renal dialysis: Secondary | ICD-10-CM | POA: Diagnosis not present

## 2021-12-01 DIAGNOSIS — E875 Hyperkalemia: Secondary | ICD-10-CM | POA: Diagnosis not present

## 2021-12-01 DIAGNOSIS — E1122 Type 2 diabetes mellitus with diabetic chronic kidney disease: Secondary | ICD-10-CM | POA: Diagnosis not present

## 2021-12-04 DIAGNOSIS — D509 Iron deficiency anemia, unspecified: Secondary | ICD-10-CM | POA: Diagnosis not present

## 2021-12-04 DIAGNOSIS — N2581 Secondary hyperparathyroidism of renal origin: Secondary | ICD-10-CM | POA: Diagnosis not present

## 2021-12-04 DIAGNOSIS — E1122 Type 2 diabetes mellitus with diabetic chronic kidney disease: Secondary | ICD-10-CM | POA: Diagnosis not present

## 2021-12-04 DIAGNOSIS — Z992 Dependence on renal dialysis: Secondary | ICD-10-CM | POA: Diagnosis not present

## 2021-12-04 DIAGNOSIS — N186 End stage renal disease: Secondary | ICD-10-CM | POA: Diagnosis not present

## 2021-12-04 DIAGNOSIS — E875 Hyperkalemia: Secondary | ICD-10-CM | POA: Diagnosis not present

## 2021-12-06 DIAGNOSIS — E875 Hyperkalemia: Secondary | ICD-10-CM | POA: Diagnosis not present

## 2021-12-06 DIAGNOSIS — Z992 Dependence on renal dialysis: Secondary | ICD-10-CM | POA: Diagnosis not present

## 2021-12-06 DIAGNOSIS — E1122 Type 2 diabetes mellitus with diabetic chronic kidney disease: Secondary | ICD-10-CM | POA: Diagnosis not present

## 2021-12-06 DIAGNOSIS — N2581 Secondary hyperparathyroidism of renal origin: Secondary | ICD-10-CM | POA: Diagnosis not present

## 2021-12-06 DIAGNOSIS — N186 End stage renal disease: Secondary | ICD-10-CM | POA: Diagnosis not present

## 2021-12-06 DIAGNOSIS — D509 Iron deficiency anemia, unspecified: Secondary | ICD-10-CM | POA: Diagnosis not present

## 2021-12-08 DIAGNOSIS — N2581 Secondary hyperparathyroidism of renal origin: Secondary | ICD-10-CM | POA: Diagnosis not present

## 2021-12-08 DIAGNOSIS — D509 Iron deficiency anemia, unspecified: Secondary | ICD-10-CM | POA: Diagnosis not present

## 2021-12-08 DIAGNOSIS — N186 End stage renal disease: Secondary | ICD-10-CM | POA: Diagnosis not present

## 2021-12-08 DIAGNOSIS — E875 Hyperkalemia: Secondary | ICD-10-CM | POA: Diagnosis not present

## 2021-12-08 DIAGNOSIS — E1122 Type 2 diabetes mellitus with diabetic chronic kidney disease: Secondary | ICD-10-CM | POA: Diagnosis not present

## 2021-12-08 DIAGNOSIS — Z992 Dependence on renal dialysis: Secondary | ICD-10-CM | POA: Diagnosis not present

## 2021-12-11 DIAGNOSIS — Z992 Dependence on renal dialysis: Secondary | ICD-10-CM | POA: Diagnosis not present

## 2021-12-11 DIAGNOSIS — N186 End stage renal disease: Secondary | ICD-10-CM | POA: Diagnosis not present

## 2021-12-11 DIAGNOSIS — E1122 Type 2 diabetes mellitus with diabetic chronic kidney disease: Secondary | ICD-10-CM | POA: Diagnosis not present

## 2021-12-11 DIAGNOSIS — D509 Iron deficiency anemia, unspecified: Secondary | ICD-10-CM | POA: Diagnosis not present

## 2021-12-11 DIAGNOSIS — E875 Hyperkalemia: Secondary | ICD-10-CM | POA: Diagnosis not present

## 2021-12-11 DIAGNOSIS — N2581 Secondary hyperparathyroidism of renal origin: Secondary | ICD-10-CM | POA: Diagnosis not present

## 2021-12-13 DIAGNOSIS — N2581 Secondary hyperparathyroidism of renal origin: Secondary | ICD-10-CM | POA: Diagnosis not present

## 2021-12-13 DIAGNOSIS — E1122 Type 2 diabetes mellitus with diabetic chronic kidney disease: Secondary | ICD-10-CM | POA: Diagnosis not present

## 2021-12-13 DIAGNOSIS — Z992 Dependence on renal dialysis: Secondary | ICD-10-CM | POA: Diagnosis not present

## 2021-12-13 DIAGNOSIS — N186 End stage renal disease: Secondary | ICD-10-CM | POA: Diagnosis not present

## 2021-12-13 DIAGNOSIS — E875 Hyperkalemia: Secondary | ICD-10-CM | POA: Diagnosis not present

## 2021-12-13 DIAGNOSIS — D509 Iron deficiency anemia, unspecified: Secondary | ICD-10-CM | POA: Diagnosis not present

## 2021-12-14 DIAGNOSIS — H0589 Other disorders of orbit: Secondary | ICD-10-CM | POA: Diagnosis not present

## 2021-12-14 DIAGNOSIS — H0520 Unspecified exophthalmos: Secondary | ICD-10-CM | POA: Diagnosis not present

## 2021-12-14 DIAGNOSIS — I7782 Antineutrophilic cytoplasmic antibody (ANCA) vasculitis: Secondary | ICD-10-CM | POA: Diagnosis not present

## 2021-12-14 DIAGNOSIS — Z79899 Other long term (current) drug therapy: Secondary | ICD-10-CM | POA: Diagnosis not present

## 2021-12-14 DIAGNOSIS — M3131 Wegener's granulomatosis with renal involvement: Secondary | ICD-10-CM | POA: Diagnosis not present

## 2021-12-15 DIAGNOSIS — E1122 Type 2 diabetes mellitus with diabetic chronic kidney disease: Secondary | ICD-10-CM | POA: Diagnosis not present

## 2021-12-15 DIAGNOSIS — N2581 Secondary hyperparathyroidism of renal origin: Secondary | ICD-10-CM | POA: Diagnosis not present

## 2021-12-15 DIAGNOSIS — E875 Hyperkalemia: Secondary | ICD-10-CM | POA: Diagnosis not present

## 2021-12-15 DIAGNOSIS — N186 End stage renal disease: Secondary | ICD-10-CM | POA: Diagnosis not present

## 2021-12-15 DIAGNOSIS — D509 Iron deficiency anemia, unspecified: Secondary | ICD-10-CM | POA: Diagnosis not present

## 2021-12-15 DIAGNOSIS — Z992 Dependence on renal dialysis: Secondary | ICD-10-CM | POA: Diagnosis not present

## 2021-12-18 DIAGNOSIS — D509 Iron deficiency anemia, unspecified: Secondary | ICD-10-CM | POA: Diagnosis not present

## 2021-12-18 DIAGNOSIS — N186 End stage renal disease: Secondary | ICD-10-CM | POA: Diagnosis not present

## 2021-12-18 DIAGNOSIS — E1122 Type 2 diabetes mellitus with diabetic chronic kidney disease: Secondary | ICD-10-CM | POA: Diagnosis not present

## 2021-12-18 DIAGNOSIS — E875 Hyperkalemia: Secondary | ICD-10-CM | POA: Diagnosis not present

## 2021-12-18 DIAGNOSIS — Z992 Dependence on renal dialysis: Secondary | ICD-10-CM | POA: Diagnosis not present

## 2021-12-18 DIAGNOSIS — N2581 Secondary hyperparathyroidism of renal origin: Secondary | ICD-10-CM | POA: Diagnosis not present

## 2021-12-20 DIAGNOSIS — N186 End stage renal disease: Secondary | ICD-10-CM | POA: Diagnosis not present

## 2021-12-20 DIAGNOSIS — D509 Iron deficiency anemia, unspecified: Secondary | ICD-10-CM | POA: Diagnosis not present

## 2021-12-20 DIAGNOSIS — E1122 Type 2 diabetes mellitus with diabetic chronic kidney disease: Secondary | ICD-10-CM | POA: Diagnosis not present

## 2021-12-20 DIAGNOSIS — N2581 Secondary hyperparathyroidism of renal origin: Secondary | ICD-10-CM | POA: Diagnosis not present

## 2021-12-20 DIAGNOSIS — E875 Hyperkalemia: Secondary | ICD-10-CM | POA: Diagnosis not present

## 2021-12-20 DIAGNOSIS — Z992 Dependence on renal dialysis: Secondary | ICD-10-CM | POA: Diagnosis not present

## 2021-12-22 DIAGNOSIS — N2581 Secondary hyperparathyroidism of renal origin: Secondary | ICD-10-CM | POA: Diagnosis not present

## 2021-12-22 DIAGNOSIS — N186 End stage renal disease: Secondary | ICD-10-CM | POA: Diagnosis not present

## 2021-12-22 DIAGNOSIS — D509 Iron deficiency anemia, unspecified: Secondary | ICD-10-CM | POA: Diagnosis not present

## 2021-12-22 DIAGNOSIS — E875 Hyperkalemia: Secondary | ICD-10-CM | POA: Diagnosis not present

## 2021-12-22 DIAGNOSIS — E1122 Type 2 diabetes mellitus with diabetic chronic kidney disease: Secondary | ICD-10-CM | POA: Diagnosis not present

## 2021-12-22 DIAGNOSIS — Z992 Dependence on renal dialysis: Secondary | ICD-10-CM | POA: Diagnosis not present

## 2021-12-25 DIAGNOSIS — E875 Hyperkalemia: Secondary | ICD-10-CM | POA: Diagnosis not present

## 2021-12-25 DIAGNOSIS — Z992 Dependence on renal dialysis: Secondary | ICD-10-CM | POA: Diagnosis not present

## 2021-12-25 DIAGNOSIS — N186 End stage renal disease: Secondary | ICD-10-CM | POA: Diagnosis not present

## 2021-12-25 DIAGNOSIS — E1122 Type 2 diabetes mellitus with diabetic chronic kidney disease: Secondary | ICD-10-CM | POA: Diagnosis not present

## 2021-12-25 DIAGNOSIS — D509 Iron deficiency anemia, unspecified: Secondary | ICD-10-CM | POA: Diagnosis not present

## 2021-12-25 DIAGNOSIS — N2581 Secondary hyperparathyroidism of renal origin: Secondary | ICD-10-CM | POA: Diagnosis not present

## 2021-12-26 DIAGNOSIS — Z992 Dependence on renal dialysis: Secondary | ICD-10-CM | POA: Diagnosis not present

## 2021-12-26 DIAGNOSIS — N186 End stage renal disease: Secondary | ICD-10-CM | POA: Diagnosis not present

## 2021-12-26 DIAGNOSIS — N032 Chronic nephritic syndrome with diffuse membranous glomerulonephritis: Secondary | ICD-10-CM | POA: Diagnosis not present

## 2021-12-27 DIAGNOSIS — D509 Iron deficiency anemia, unspecified: Secondary | ICD-10-CM | POA: Diagnosis not present

## 2021-12-27 DIAGNOSIS — N2581 Secondary hyperparathyroidism of renal origin: Secondary | ICD-10-CM | POA: Diagnosis not present

## 2021-12-27 DIAGNOSIS — E875 Hyperkalemia: Secondary | ICD-10-CM | POA: Diagnosis not present

## 2021-12-27 DIAGNOSIS — D631 Anemia in chronic kidney disease: Secondary | ICD-10-CM | POA: Diagnosis not present

## 2021-12-27 DIAGNOSIS — Z992 Dependence on renal dialysis: Secondary | ICD-10-CM | POA: Diagnosis not present

## 2021-12-27 DIAGNOSIS — E1122 Type 2 diabetes mellitus with diabetic chronic kidney disease: Secondary | ICD-10-CM | POA: Diagnosis not present

## 2021-12-27 DIAGNOSIS — N186 End stage renal disease: Secondary | ICD-10-CM | POA: Diagnosis not present

## 2021-12-29 DIAGNOSIS — Z992 Dependence on renal dialysis: Secondary | ICD-10-CM | POA: Diagnosis not present

## 2021-12-29 DIAGNOSIS — D631 Anemia in chronic kidney disease: Secondary | ICD-10-CM | POA: Diagnosis not present

## 2021-12-29 DIAGNOSIS — D509 Iron deficiency anemia, unspecified: Secondary | ICD-10-CM | POA: Diagnosis not present

## 2021-12-29 DIAGNOSIS — N2581 Secondary hyperparathyroidism of renal origin: Secondary | ICD-10-CM | POA: Diagnosis not present

## 2021-12-29 DIAGNOSIS — N186 End stage renal disease: Secondary | ICD-10-CM | POA: Diagnosis not present

## 2021-12-29 DIAGNOSIS — E875 Hyperkalemia: Secondary | ICD-10-CM | POA: Diagnosis not present

## 2022-01-01 DIAGNOSIS — D509 Iron deficiency anemia, unspecified: Secondary | ICD-10-CM | POA: Diagnosis not present

## 2022-01-01 DIAGNOSIS — N2581 Secondary hyperparathyroidism of renal origin: Secondary | ICD-10-CM | POA: Diagnosis not present

## 2022-01-01 DIAGNOSIS — N186 End stage renal disease: Secondary | ICD-10-CM | POA: Diagnosis not present

## 2022-01-01 DIAGNOSIS — D631 Anemia in chronic kidney disease: Secondary | ICD-10-CM | POA: Diagnosis not present

## 2022-01-01 DIAGNOSIS — Z992 Dependence on renal dialysis: Secondary | ICD-10-CM | POA: Diagnosis not present

## 2022-01-01 DIAGNOSIS — E875 Hyperkalemia: Secondary | ICD-10-CM | POA: Diagnosis not present

## 2022-01-03 DIAGNOSIS — Z992 Dependence on renal dialysis: Secondary | ICD-10-CM | POA: Diagnosis not present

## 2022-01-03 DIAGNOSIS — N186 End stage renal disease: Secondary | ICD-10-CM | POA: Diagnosis not present

## 2022-01-03 DIAGNOSIS — D631 Anemia in chronic kidney disease: Secondary | ICD-10-CM | POA: Diagnosis not present

## 2022-01-03 DIAGNOSIS — N2581 Secondary hyperparathyroidism of renal origin: Secondary | ICD-10-CM | POA: Diagnosis not present

## 2022-01-03 DIAGNOSIS — E875 Hyperkalemia: Secondary | ICD-10-CM | POA: Diagnosis not present

## 2022-01-03 DIAGNOSIS — D509 Iron deficiency anemia, unspecified: Secondary | ICD-10-CM | POA: Diagnosis not present

## 2022-01-05 DIAGNOSIS — Z992 Dependence on renal dialysis: Secondary | ICD-10-CM | POA: Diagnosis not present

## 2022-01-05 DIAGNOSIS — N186 End stage renal disease: Secondary | ICD-10-CM | POA: Diagnosis not present

## 2022-01-05 DIAGNOSIS — E875 Hyperkalemia: Secondary | ICD-10-CM | POA: Diagnosis not present

## 2022-01-05 DIAGNOSIS — N2581 Secondary hyperparathyroidism of renal origin: Secondary | ICD-10-CM | POA: Diagnosis not present

## 2022-01-05 DIAGNOSIS — D509 Iron deficiency anemia, unspecified: Secondary | ICD-10-CM | POA: Diagnosis not present

## 2022-01-05 DIAGNOSIS — D631 Anemia in chronic kidney disease: Secondary | ICD-10-CM | POA: Diagnosis not present

## 2022-01-08 DIAGNOSIS — Z992 Dependence on renal dialysis: Secondary | ICD-10-CM | POA: Diagnosis not present

## 2022-01-08 DIAGNOSIS — D509 Iron deficiency anemia, unspecified: Secondary | ICD-10-CM | POA: Diagnosis not present

## 2022-01-08 DIAGNOSIS — N2581 Secondary hyperparathyroidism of renal origin: Secondary | ICD-10-CM | POA: Diagnosis not present

## 2022-01-08 DIAGNOSIS — N186 End stage renal disease: Secondary | ICD-10-CM | POA: Diagnosis not present

## 2022-01-08 DIAGNOSIS — D631 Anemia in chronic kidney disease: Secondary | ICD-10-CM | POA: Diagnosis not present

## 2022-01-08 DIAGNOSIS — E875 Hyperkalemia: Secondary | ICD-10-CM | POA: Diagnosis not present

## 2022-01-10 ENCOUNTER — Ambulatory Visit (INDEPENDENT_AMBULATORY_CARE_PROVIDER_SITE_OTHER): Payer: Medicare Other

## 2022-01-10 DIAGNOSIS — I255 Ischemic cardiomyopathy: Secondary | ICD-10-CM | POA: Diagnosis not present

## 2022-01-10 DIAGNOSIS — E875 Hyperkalemia: Secondary | ICD-10-CM | POA: Diagnosis not present

## 2022-01-10 DIAGNOSIS — D631 Anemia in chronic kidney disease: Secondary | ICD-10-CM | POA: Diagnosis not present

## 2022-01-10 DIAGNOSIS — N2581 Secondary hyperparathyroidism of renal origin: Secondary | ICD-10-CM | POA: Diagnosis not present

## 2022-01-10 DIAGNOSIS — I5022 Chronic systolic (congestive) heart failure: Secondary | ICD-10-CM

## 2022-01-10 DIAGNOSIS — N186 End stage renal disease: Secondary | ICD-10-CM | POA: Diagnosis not present

## 2022-01-10 DIAGNOSIS — D509 Iron deficiency anemia, unspecified: Secondary | ICD-10-CM | POA: Diagnosis not present

## 2022-01-10 DIAGNOSIS — Z992 Dependence on renal dialysis: Secondary | ICD-10-CM | POA: Diagnosis not present

## 2022-01-11 LAB — CUP PACEART REMOTE DEVICE CHECK
Battery Remaining Longevity: 79 mo
Battery Voltage: 2.98 V
Brady Statistic RV Percent Paced: 0.01 %
Date Time Interrogation Session: 20230316192212
HighPow Impedance: 254 Ohm
HighPow Impedance: 77 Ohm
Implantable Lead Implant Date: 20210315
Implantable Lead Location: 753860
Implantable Lead Model: 6935
Implantable Pulse Generator Implant Date: 20190208
Lead Channel Impedance Value: 361 Ohm
Lead Channel Impedance Value: 513 Ohm
Lead Channel Pacing Threshold Amplitude: 0.75 V
Lead Channel Pacing Threshold Pulse Width: 0.4 ms
Lead Channel Sensing Intrinsic Amplitude: 11.375 mV
Lead Channel Sensing Intrinsic Amplitude: 11.375 mV
Lead Channel Setting Pacing Amplitude: 2 V
Lead Channel Setting Pacing Pulse Width: 0.4 ms
Lead Channel Setting Sensing Sensitivity: 0.3 mV

## 2022-01-12 DIAGNOSIS — N186 End stage renal disease: Secondary | ICD-10-CM | POA: Diagnosis not present

## 2022-01-12 DIAGNOSIS — Z992 Dependence on renal dialysis: Secondary | ICD-10-CM | POA: Diagnosis not present

## 2022-01-12 DIAGNOSIS — D509 Iron deficiency anemia, unspecified: Secondary | ICD-10-CM | POA: Diagnosis not present

## 2022-01-12 DIAGNOSIS — D631 Anemia in chronic kidney disease: Secondary | ICD-10-CM | POA: Diagnosis not present

## 2022-01-12 DIAGNOSIS — E875 Hyperkalemia: Secondary | ICD-10-CM | POA: Diagnosis not present

## 2022-01-12 DIAGNOSIS — N2581 Secondary hyperparathyroidism of renal origin: Secondary | ICD-10-CM | POA: Diagnosis not present

## 2022-01-15 DIAGNOSIS — D509 Iron deficiency anemia, unspecified: Secondary | ICD-10-CM | POA: Diagnosis not present

## 2022-01-15 DIAGNOSIS — D631 Anemia in chronic kidney disease: Secondary | ICD-10-CM | POA: Diagnosis not present

## 2022-01-15 DIAGNOSIS — N2581 Secondary hyperparathyroidism of renal origin: Secondary | ICD-10-CM | POA: Diagnosis not present

## 2022-01-15 DIAGNOSIS — Z992 Dependence on renal dialysis: Secondary | ICD-10-CM | POA: Diagnosis not present

## 2022-01-15 DIAGNOSIS — E875 Hyperkalemia: Secondary | ICD-10-CM | POA: Diagnosis not present

## 2022-01-15 DIAGNOSIS — N186 End stage renal disease: Secondary | ICD-10-CM | POA: Diagnosis not present

## 2022-01-17 DIAGNOSIS — N186 End stage renal disease: Secondary | ICD-10-CM | POA: Diagnosis not present

## 2022-01-17 DIAGNOSIS — N2581 Secondary hyperparathyroidism of renal origin: Secondary | ICD-10-CM | POA: Diagnosis not present

## 2022-01-17 DIAGNOSIS — E875 Hyperkalemia: Secondary | ICD-10-CM | POA: Diagnosis not present

## 2022-01-17 DIAGNOSIS — Z992 Dependence on renal dialysis: Secondary | ICD-10-CM | POA: Diagnosis not present

## 2022-01-17 DIAGNOSIS — D509 Iron deficiency anemia, unspecified: Secondary | ICD-10-CM | POA: Diagnosis not present

## 2022-01-17 DIAGNOSIS — D631 Anemia in chronic kidney disease: Secondary | ICD-10-CM | POA: Diagnosis not present

## 2022-01-18 NOTE — Progress Notes (Signed)
Remote ICD transmission.   

## 2022-01-19 DIAGNOSIS — D509 Iron deficiency anemia, unspecified: Secondary | ICD-10-CM | POA: Diagnosis not present

## 2022-01-19 DIAGNOSIS — N186 End stage renal disease: Secondary | ICD-10-CM | POA: Diagnosis not present

## 2022-01-19 DIAGNOSIS — Z992 Dependence on renal dialysis: Secondary | ICD-10-CM | POA: Diagnosis not present

## 2022-01-19 DIAGNOSIS — E875 Hyperkalemia: Secondary | ICD-10-CM | POA: Diagnosis not present

## 2022-01-19 DIAGNOSIS — D631 Anemia in chronic kidney disease: Secondary | ICD-10-CM | POA: Diagnosis not present

## 2022-01-19 DIAGNOSIS — N2581 Secondary hyperparathyroidism of renal origin: Secondary | ICD-10-CM | POA: Diagnosis not present

## 2022-01-22 DIAGNOSIS — N186 End stage renal disease: Secondary | ICD-10-CM | POA: Diagnosis not present

## 2022-01-22 DIAGNOSIS — D631 Anemia in chronic kidney disease: Secondary | ICD-10-CM | POA: Diagnosis not present

## 2022-01-22 DIAGNOSIS — N2581 Secondary hyperparathyroidism of renal origin: Secondary | ICD-10-CM | POA: Diagnosis not present

## 2022-01-22 DIAGNOSIS — E875 Hyperkalemia: Secondary | ICD-10-CM | POA: Diagnosis not present

## 2022-01-22 DIAGNOSIS — D509 Iron deficiency anemia, unspecified: Secondary | ICD-10-CM | POA: Diagnosis not present

## 2022-01-22 DIAGNOSIS — Z992 Dependence on renal dialysis: Secondary | ICD-10-CM | POA: Diagnosis not present

## 2022-01-23 DIAGNOSIS — Z20822 Contact with and (suspected) exposure to covid-19: Secondary | ICD-10-CM | POA: Diagnosis not present

## 2022-01-24 DIAGNOSIS — D631 Anemia in chronic kidney disease: Secondary | ICD-10-CM | POA: Diagnosis not present

## 2022-01-24 DIAGNOSIS — E875 Hyperkalemia: Secondary | ICD-10-CM | POA: Diagnosis not present

## 2022-01-24 DIAGNOSIS — N186 End stage renal disease: Secondary | ICD-10-CM | POA: Diagnosis not present

## 2022-01-24 DIAGNOSIS — N2581 Secondary hyperparathyroidism of renal origin: Secondary | ICD-10-CM | POA: Diagnosis not present

## 2022-01-24 DIAGNOSIS — D509 Iron deficiency anemia, unspecified: Secondary | ICD-10-CM | POA: Diagnosis not present

## 2022-01-24 DIAGNOSIS — Z992 Dependence on renal dialysis: Secondary | ICD-10-CM | POA: Diagnosis not present

## 2022-01-26 DIAGNOSIS — E1122 Type 2 diabetes mellitus with diabetic chronic kidney disease: Secondary | ICD-10-CM | POA: Diagnosis not present

## 2022-01-26 DIAGNOSIS — E875 Hyperkalemia: Secondary | ICD-10-CM | POA: Diagnosis not present

## 2022-01-26 DIAGNOSIS — N032 Chronic nephritic syndrome with diffuse membranous glomerulonephritis: Secondary | ICD-10-CM | POA: Diagnosis not present

## 2022-01-26 DIAGNOSIS — Z992 Dependence on renal dialysis: Secondary | ICD-10-CM | POA: Diagnosis not present

## 2022-01-26 DIAGNOSIS — D631 Anemia in chronic kidney disease: Secondary | ICD-10-CM | POA: Diagnosis not present

## 2022-01-26 DIAGNOSIS — D509 Iron deficiency anemia, unspecified: Secondary | ICD-10-CM | POA: Diagnosis not present

## 2022-01-26 DIAGNOSIS — N2581 Secondary hyperparathyroidism of renal origin: Secondary | ICD-10-CM | POA: Diagnosis not present

## 2022-01-26 DIAGNOSIS — N186 End stage renal disease: Secondary | ICD-10-CM | POA: Diagnosis not present

## 2022-01-29 DIAGNOSIS — N2581 Secondary hyperparathyroidism of renal origin: Secondary | ICD-10-CM | POA: Diagnosis not present

## 2022-01-29 DIAGNOSIS — E875 Hyperkalemia: Secondary | ICD-10-CM | POA: Diagnosis not present

## 2022-01-29 DIAGNOSIS — N186 End stage renal disease: Secondary | ICD-10-CM | POA: Diagnosis not present

## 2022-01-29 DIAGNOSIS — D631 Anemia in chronic kidney disease: Secondary | ICD-10-CM | POA: Diagnosis not present

## 2022-01-29 DIAGNOSIS — D509 Iron deficiency anemia, unspecified: Secondary | ICD-10-CM | POA: Diagnosis not present

## 2022-01-29 DIAGNOSIS — Z992 Dependence on renal dialysis: Secondary | ICD-10-CM | POA: Diagnosis not present

## 2022-01-31 DIAGNOSIS — E875 Hyperkalemia: Secondary | ICD-10-CM | POA: Diagnosis not present

## 2022-01-31 DIAGNOSIS — D509 Iron deficiency anemia, unspecified: Secondary | ICD-10-CM | POA: Diagnosis not present

## 2022-01-31 DIAGNOSIS — Z992 Dependence on renal dialysis: Secondary | ICD-10-CM | POA: Diagnosis not present

## 2022-01-31 DIAGNOSIS — N186 End stage renal disease: Secondary | ICD-10-CM | POA: Diagnosis not present

## 2022-01-31 DIAGNOSIS — D631 Anemia in chronic kidney disease: Secondary | ICD-10-CM | POA: Diagnosis not present

## 2022-01-31 DIAGNOSIS — N2581 Secondary hyperparathyroidism of renal origin: Secondary | ICD-10-CM | POA: Diagnosis not present

## 2022-02-02 DIAGNOSIS — E875 Hyperkalemia: Secondary | ICD-10-CM | POA: Diagnosis not present

## 2022-02-02 DIAGNOSIS — D631 Anemia in chronic kidney disease: Secondary | ICD-10-CM | POA: Diagnosis not present

## 2022-02-02 DIAGNOSIS — Z992 Dependence on renal dialysis: Secondary | ICD-10-CM | POA: Diagnosis not present

## 2022-02-02 DIAGNOSIS — N2581 Secondary hyperparathyroidism of renal origin: Secondary | ICD-10-CM | POA: Diagnosis not present

## 2022-02-02 DIAGNOSIS — N186 End stage renal disease: Secondary | ICD-10-CM | POA: Diagnosis not present

## 2022-02-02 DIAGNOSIS — D509 Iron deficiency anemia, unspecified: Secondary | ICD-10-CM | POA: Diagnosis not present

## 2022-02-05 DIAGNOSIS — E875 Hyperkalemia: Secondary | ICD-10-CM | POA: Diagnosis not present

## 2022-02-05 DIAGNOSIS — N2581 Secondary hyperparathyroidism of renal origin: Secondary | ICD-10-CM | POA: Diagnosis not present

## 2022-02-05 DIAGNOSIS — D509 Iron deficiency anemia, unspecified: Secondary | ICD-10-CM | POA: Diagnosis not present

## 2022-02-05 DIAGNOSIS — N186 End stage renal disease: Secondary | ICD-10-CM | POA: Diagnosis not present

## 2022-02-05 DIAGNOSIS — D631 Anemia in chronic kidney disease: Secondary | ICD-10-CM | POA: Diagnosis not present

## 2022-02-05 DIAGNOSIS — Z992 Dependence on renal dialysis: Secondary | ICD-10-CM | POA: Diagnosis not present

## 2022-02-07 DIAGNOSIS — D509 Iron deficiency anemia, unspecified: Secondary | ICD-10-CM | POA: Diagnosis not present

## 2022-02-07 DIAGNOSIS — N186 End stage renal disease: Secondary | ICD-10-CM | POA: Diagnosis not present

## 2022-02-07 DIAGNOSIS — D631 Anemia in chronic kidney disease: Secondary | ICD-10-CM | POA: Diagnosis not present

## 2022-02-07 DIAGNOSIS — N2581 Secondary hyperparathyroidism of renal origin: Secondary | ICD-10-CM | POA: Diagnosis not present

## 2022-02-07 DIAGNOSIS — E875 Hyperkalemia: Secondary | ICD-10-CM | POA: Diagnosis not present

## 2022-02-07 DIAGNOSIS — Z992 Dependence on renal dialysis: Secondary | ICD-10-CM | POA: Diagnosis not present

## 2022-02-09 DIAGNOSIS — N186 End stage renal disease: Secondary | ICD-10-CM | POA: Diagnosis not present

## 2022-02-09 DIAGNOSIS — D509 Iron deficiency anemia, unspecified: Secondary | ICD-10-CM | POA: Diagnosis not present

## 2022-02-09 DIAGNOSIS — N2581 Secondary hyperparathyroidism of renal origin: Secondary | ICD-10-CM | POA: Diagnosis not present

## 2022-02-09 DIAGNOSIS — E875 Hyperkalemia: Secondary | ICD-10-CM | POA: Diagnosis not present

## 2022-02-09 DIAGNOSIS — Z992 Dependence on renal dialysis: Secondary | ICD-10-CM | POA: Diagnosis not present

## 2022-02-09 DIAGNOSIS — D631 Anemia in chronic kidney disease: Secondary | ICD-10-CM | POA: Diagnosis not present

## 2022-02-12 DIAGNOSIS — N186 End stage renal disease: Secondary | ICD-10-CM | POA: Diagnosis not present

## 2022-02-12 DIAGNOSIS — E875 Hyperkalemia: Secondary | ICD-10-CM | POA: Diagnosis not present

## 2022-02-12 DIAGNOSIS — N2581 Secondary hyperparathyroidism of renal origin: Secondary | ICD-10-CM | POA: Diagnosis not present

## 2022-02-12 DIAGNOSIS — D631 Anemia in chronic kidney disease: Secondary | ICD-10-CM | POA: Diagnosis not present

## 2022-02-12 DIAGNOSIS — D509 Iron deficiency anemia, unspecified: Secondary | ICD-10-CM | POA: Diagnosis not present

## 2022-02-12 DIAGNOSIS — Z992 Dependence on renal dialysis: Secondary | ICD-10-CM | POA: Diagnosis not present

## 2022-02-14 DIAGNOSIS — N186 End stage renal disease: Secondary | ICD-10-CM | POA: Diagnosis not present

## 2022-02-14 DIAGNOSIS — D509 Iron deficiency anemia, unspecified: Secondary | ICD-10-CM | POA: Diagnosis not present

## 2022-02-14 DIAGNOSIS — D631 Anemia in chronic kidney disease: Secondary | ICD-10-CM | POA: Diagnosis not present

## 2022-02-14 DIAGNOSIS — Z992 Dependence on renal dialysis: Secondary | ICD-10-CM | POA: Diagnosis not present

## 2022-02-14 DIAGNOSIS — N2581 Secondary hyperparathyroidism of renal origin: Secondary | ICD-10-CM | POA: Diagnosis not present

## 2022-02-14 DIAGNOSIS — E875 Hyperkalemia: Secondary | ICD-10-CM | POA: Diagnosis not present

## 2022-02-14 DIAGNOSIS — E1122 Type 2 diabetes mellitus with diabetic chronic kidney disease: Secondary | ICD-10-CM | POA: Diagnosis not present

## 2022-02-16 DIAGNOSIS — D509 Iron deficiency anemia, unspecified: Secondary | ICD-10-CM | POA: Diagnosis not present

## 2022-02-16 DIAGNOSIS — E875 Hyperkalemia: Secondary | ICD-10-CM | POA: Diagnosis not present

## 2022-02-16 DIAGNOSIS — D631 Anemia in chronic kidney disease: Secondary | ICD-10-CM | POA: Diagnosis not present

## 2022-02-16 DIAGNOSIS — Z992 Dependence on renal dialysis: Secondary | ICD-10-CM | POA: Diagnosis not present

## 2022-02-16 DIAGNOSIS — N2581 Secondary hyperparathyroidism of renal origin: Secondary | ICD-10-CM | POA: Diagnosis not present

## 2022-02-16 DIAGNOSIS — N186 End stage renal disease: Secondary | ICD-10-CM | POA: Diagnosis not present

## 2022-02-19 DIAGNOSIS — N2581 Secondary hyperparathyroidism of renal origin: Secondary | ICD-10-CM | POA: Diagnosis not present

## 2022-02-19 DIAGNOSIS — D509 Iron deficiency anemia, unspecified: Secondary | ICD-10-CM | POA: Diagnosis not present

## 2022-02-19 DIAGNOSIS — N186 End stage renal disease: Secondary | ICD-10-CM | POA: Diagnosis not present

## 2022-02-19 DIAGNOSIS — D631 Anemia in chronic kidney disease: Secondary | ICD-10-CM | POA: Diagnosis not present

## 2022-02-19 DIAGNOSIS — Z992 Dependence on renal dialysis: Secondary | ICD-10-CM | POA: Diagnosis not present

## 2022-02-19 DIAGNOSIS — E875 Hyperkalemia: Secondary | ICD-10-CM | POA: Diagnosis not present

## 2022-02-21 DIAGNOSIS — N2581 Secondary hyperparathyroidism of renal origin: Secondary | ICD-10-CM | POA: Diagnosis not present

## 2022-02-21 DIAGNOSIS — Z992 Dependence on renal dialysis: Secondary | ICD-10-CM | POA: Diagnosis not present

## 2022-02-21 DIAGNOSIS — E875 Hyperkalemia: Secondary | ICD-10-CM | POA: Diagnosis not present

## 2022-02-21 DIAGNOSIS — Z20822 Contact with and (suspected) exposure to covid-19: Secondary | ICD-10-CM | POA: Diagnosis not present

## 2022-02-21 DIAGNOSIS — D509 Iron deficiency anemia, unspecified: Secondary | ICD-10-CM | POA: Diagnosis not present

## 2022-02-21 DIAGNOSIS — D631 Anemia in chronic kidney disease: Secondary | ICD-10-CM | POA: Diagnosis not present

## 2022-02-21 DIAGNOSIS — N186 End stage renal disease: Secondary | ICD-10-CM | POA: Diagnosis not present

## 2022-02-23 DIAGNOSIS — E875 Hyperkalemia: Secondary | ICD-10-CM | POA: Diagnosis not present

## 2022-02-23 DIAGNOSIS — D509 Iron deficiency anemia, unspecified: Secondary | ICD-10-CM | POA: Diagnosis not present

## 2022-02-23 DIAGNOSIS — D631 Anemia in chronic kidney disease: Secondary | ICD-10-CM | POA: Diagnosis not present

## 2022-02-23 DIAGNOSIS — N2581 Secondary hyperparathyroidism of renal origin: Secondary | ICD-10-CM | POA: Diagnosis not present

## 2022-02-23 DIAGNOSIS — N186 End stage renal disease: Secondary | ICD-10-CM | POA: Diagnosis not present

## 2022-02-23 DIAGNOSIS — Z992 Dependence on renal dialysis: Secondary | ICD-10-CM | POA: Diagnosis not present

## 2022-02-25 DIAGNOSIS — N032 Chronic nephritic syndrome with diffuse membranous glomerulonephritis: Secondary | ICD-10-CM | POA: Diagnosis not present

## 2022-02-25 DIAGNOSIS — Z992 Dependence on renal dialysis: Secondary | ICD-10-CM | POA: Diagnosis not present

## 2022-02-25 DIAGNOSIS — N186 End stage renal disease: Secondary | ICD-10-CM | POA: Diagnosis not present

## 2022-02-26 DIAGNOSIS — D509 Iron deficiency anemia, unspecified: Secondary | ICD-10-CM | POA: Diagnosis not present

## 2022-02-26 DIAGNOSIS — E1122 Type 2 diabetes mellitus with diabetic chronic kidney disease: Secondary | ICD-10-CM | POA: Diagnosis not present

## 2022-02-26 DIAGNOSIS — N186 End stage renal disease: Secondary | ICD-10-CM | POA: Diagnosis not present

## 2022-02-26 DIAGNOSIS — Z992 Dependence on renal dialysis: Secondary | ICD-10-CM | POA: Diagnosis not present

## 2022-02-26 DIAGNOSIS — D631 Anemia in chronic kidney disease: Secondary | ICD-10-CM | POA: Diagnosis not present

## 2022-02-26 DIAGNOSIS — E875 Hyperkalemia: Secondary | ICD-10-CM | POA: Diagnosis not present

## 2022-02-26 DIAGNOSIS — N2581 Secondary hyperparathyroidism of renal origin: Secondary | ICD-10-CM | POA: Diagnosis not present

## 2022-02-28 DIAGNOSIS — D631 Anemia in chronic kidney disease: Secondary | ICD-10-CM | POA: Diagnosis not present

## 2022-02-28 DIAGNOSIS — E1122 Type 2 diabetes mellitus with diabetic chronic kidney disease: Secondary | ICD-10-CM | POA: Diagnosis not present

## 2022-02-28 DIAGNOSIS — N186 End stage renal disease: Secondary | ICD-10-CM | POA: Diagnosis not present

## 2022-02-28 DIAGNOSIS — Z992 Dependence on renal dialysis: Secondary | ICD-10-CM | POA: Diagnosis not present

## 2022-02-28 DIAGNOSIS — N2581 Secondary hyperparathyroidism of renal origin: Secondary | ICD-10-CM | POA: Diagnosis not present

## 2022-02-28 DIAGNOSIS — E875 Hyperkalemia: Secondary | ICD-10-CM | POA: Diagnosis not present

## 2022-03-02 DIAGNOSIS — E1122 Type 2 diabetes mellitus with diabetic chronic kidney disease: Secondary | ICD-10-CM | POA: Diagnosis not present

## 2022-03-02 DIAGNOSIS — D631 Anemia in chronic kidney disease: Secondary | ICD-10-CM | POA: Diagnosis not present

## 2022-03-02 DIAGNOSIS — Z992 Dependence on renal dialysis: Secondary | ICD-10-CM | POA: Diagnosis not present

## 2022-03-02 DIAGNOSIS — N2581 Secondary hyperparathyroidism of renal origin: Secondary | ICD-10-CM | POA: Diagnosis not present

## 2022-03-02 DIAGNOSIS — N186 End stage renal disease: Secondary | ICD-10-CM | POA: Diagnosis not present

## 2022-03-02 DIAGNOSIS — E875 Hyperkalemia: Secondary | ICD-10-CM | POA: Diagnosis not present

## 2022-03-05 DIAGNOSIS — Z992 Dependence on renal dialysis: Secondary | ICD-10-CM | POA: Diagnosis not present

## 2022-03-05 DIAGNOSIS — N186 End stage renal disease: Secondary | ICD-10-CM | POA: Diagnosis not present

## 2022-03-05 DIAGNOSIS — E875 Hyperkalemia: Secondary | ICD-10-CM | POA: Diagnosis not present

## 2022-03-05 DIAGNOSIS — D631 Anemia in chronic kidney disease: Secondary | ICD-10-CM | POA: Diagnosis not present

## 2022-03-05 DIAGNOSIS — N2581 Secondary hyperparathyroidism of renal origin: Secondary | ICD-10-CM | POA: Diagnosis not present

## 2022-03-05 DIAGNOSIS — E1122 Type 2 diabetes mellitus with diabetic chronic kidney disease: Secondary | ICD-10-CM | POA: Diagnosis not present

## 2022-03-07 DIAGNOSIS — E875 Hyperkalemia: Secondary | ICD-10-CM | POA: Diagnosis not present

## 2022-03-07 DIAGNOSIS — N186 End stage renal disease: Secondary | ICD-10-CM | POA: Diagnosis not present

## 2022-03-07 DIAGNOSIS — E1122 Type 2 diabetes mellitus with diabetic chronic kidney disease: Secondary | ICD-10-CM | POA: Diagnosis not present

## 2022-03-07 DIAGNOSIS — Z992 Dependence on renal dialysis: Secondary | ICD-10-CM | POA: Diagnosis not present

## 2022-03-07 DIAGNOSIS — N2581 Secondary hyperparathyroidism of renal origin: Secondary | ICD-10-CM | POA: Diagnosis not present

## 2022-03-07 DIAGNOSIS — D631 Anemia in chronic kidney disease: Secondary | ICD-10-CM | POA: Diagnosis not present

## 2022-03-09 DIAGNOSIS — Z992 Dependence on renal dialysis: Secondary | ICD-10-CM | POA: Diagnosis not present

## 2022-03-09 DIAGNOSIS — D631 Anemia in chronic kidney disease: Secondary | ICD-10-CM | POA: Diagnosis not present

## 2022-03-09 DIAGNOSIS — E875 Hyperkalemia: Secondary | ICD-10-CM | POA: Diagnosis not present

## 2022-03-09 DIAGNOSIS — N186 End stage renal disease: Secondary | ICD-10-CM | POA: Diagnosis not present

## 2022-03-09 DIAGNOSIS — E1122 Type 2 diabetes mellitus with diabetic chronic kidney disease: Secondary | ICD-10-CM | POA: Diagnosis not present

## 2022-03-09 DIAGNOSIS — N2581 Secondary hyperparathyroidism of renal origin: Secondary | ICD-10-CM | POA: Diagnosis not present

## 2022-03-12 DIAGNOSIS — Z992 Dependence on renal dialysis: Secondary | ICD-10-CM | POA: Diagnosis not present

## 2022-03-12 DIAGNOSIS — E1122 Type 2 diabetes mellitus with diabetic chronic kidney disease: Secondary | ICD-10-CM | POA: Diagnosis not present

## 2022-03-12 DIAGNOSIS — D631 Anemia in chronic kidney disease: Secondary | ICD-10-CM | POA: Diagnosis not present

## 2022-03-12 DIAGNOSIS — N2581 Secondary hyperparathyroidism of renal origin: Secondary | ICD-10-CM | POA: Diagnosis not present

## 2022-03-12 DIAGNOSIS — N186 End stage renal disease: Secondary | ICD-10-CM | POA: Diagnosis not present

## 2022-03-12 DIAGNOSIS — E875 Hyperkalemia: Secondary | ICD-10-CM | POA: Diagnosis not present

## 2022-03-14 DIAGNOSIS — N186 End stage renal disease: Secondary | ICD-10-CM | POA: Diagnosis not present

## 2022-03-14 DIAGNOSIS — Z992 Dependence on renal dialysis: Secondary | ICD-10-CM | POA: Diagnosis not present

## 2022-03-14 DIAGNOSIS — D631 Anemia in chronic kidney disease: Secondary | ICD-10-CM | POA: Diagnosis not present

## 2022-03-14 DIAGNOSIS — E875 Hyperkalemia: Secondary | ICD-10-CM | POA: Diagnosis not present

## 2022-03-14 DIAGNOSIS — N2581 Secondary hyperparathyroidism of renal origin: Secondary | ICD-10-CM | POA: Diagnosis not present

## 2022-03-14 DIAGNOSIS — E1122 Type 2 diabetes mellitus with diabetic chronic kidney disease: Secondary | ICD-10-CM | POA: Diagnosis not present

## 2022-03-16 DIAGNOSIS — E1122 Type 2 diabetes mellitus with diabetic chronic kidney disease: Secondary | ICD-10-CM | POA: Diagnosis not present

## 2022-03-16 DIAGNOSIS — Z992 Dependence on renal dialysis: Secondary | ICD-10-CM | POA: Diagnosis not present

## 2022-03-16 DIAGNOSIS — N186 End stage renal disease: Secondary | ICD-10-CM | POA: Diagnosis not present

## 2022-03-16 DIAGNOSIS — E875 Hyperkalemia: Secondary | ICD-10-CM | POA: Diagnosis not present

## 2022-03-16 DIAGNOSIS — N2581 Secondary hyperparathyroidism of renal origin: Secondary | ICD-10-CM | POA: Diagnosis not present

## 2022-03-16 DIAGNOSIS — D631 Anemia in chronic kidney disease: Secondary | ICD-10-CM | POA: Diagnosis not present

## 2022-03-19 DIAGNOSIS — E875 Hyperkalemia: Secondary | ICD-10-CM | POA: Diagnosis not present

## 2022-03-19 DIAGNOSIS — Z992 Dependence on renal dialysis: Secondary | ICD-10-CM | POA: Diagnosis not present

## 2022-03-19 DIAGNOSIS — D631 Anemia in chronic kidney disease: Secondary | ICD-10-CM | POA: Diagnosis not present

## 2022-03-19 DIAGNOSIS — N186 End stage renal disease: Secondary | ICD-10-CM | POA: Diagnosis not present

## 2022-03-19 DIAGNOSIS — N2581 Secondary hyperparathyroidism of renal origin: Secondary | ICD-10-CM | POA: Diagnosis not present

## 2022-03-19 DIAGNOSIS — E1122 Type 2 diabetes mellitus with diabetic chronic kidney disease: Secondary | ICD-10-CM | POA: Diagnosis not present

## 2022-03-21 DIAGNOSIS — N2581 Secondary hyperparathyroidism of renal origin: Secondary | ICD-10-CM | POA: Diagnosis not present

## 2022-03-21 DIAGNOSIS — Z992 Dependence on renal dialysis: Secondary | ICD-10-CM | POA: Diagnosis not present

## 2022-03-21 DIAGNOSIS — E1122 Type 2 diabetes mellitus with diabetic chronic kidney disease: Secondary | ICD-10-CM | POA: Diagnosis not present

## 2022-03-21 DIAGNOSIS — D631 Anemia in chronic kidney disease: Secondary | ICD-10-CM | POA: Diagnosis not present

## 2022-03-21 DIAGNOSIS — N186 End stage renal disease: Secondary | ICD-10-CM | POA: Diagnosis not present

## 2022-03-21 DIAGNOSIS — E875 Hyperkalemia: Secondary | ICD-10-CM | POA: Diagnosis not present

## 2022-03-23 DIAGNOSIS — E875 Hyperkalemia: Secondary | ICD-10-CM | POA: Diagnosis not present

## 2022-03-23 DIAGNOSIS — N186 End stage renal disease: Secondary | ICD-10-CM | POA: Diagnosis not present

## 2022-03-23 DIAGNOSIS — D631 Anemia in chronic kidney disease: Secondary | ICD-10-CM | POA: Diagnosis not present

## 2022-03-23 DIAGNOSIS — Z992 Dependence on renal dialysis: Secondary | ICD-10-CM | POA: Diagnosis not present

## 2022-03-23 DIAGNOSIS — E1122 Type 2 diabetes mellitus with diabetic chronic kidney disease: Secondary | ICD-10-CM | POA: Diagnosis not present

## 2022-03-23 DIAGNOSIS — N2581 Secondary hyperparathyroidism of renal origin: Secondary | ICD-10-CM | POA: Diagnosis not present

## 2022-03-26 DIAGNOSIS — D631 Anemia in chronic kidney disease: Secondary | ICD-10-CM | POA: Diagnosis not present

## 2022-03-26 DIAGNOSIS — E1122 Type 2 diabetes mellitus with diabetic chronic kidney disease: Secondary | ICD-10-CM | POA: Diagnosis not present

## 2022-03-26 DIAGNOSIS — E875 Hyperkalemia: Secondary | ICD-10-CM | POA: Diagnosis not present

## 2022-03-26 DIAGNOSIS — N186 End stage renal disease: Secondary | ICD-10-CM | POA: Diagnosis not present

## 2022-03-26 DIAGNOSIS — Z992 Dependence on renal dialysis: Secondary | ICD-10-CM | POA: Diagnosis not present

## 2022-03-26 DIAGNOSIS — N2581 Secondary hyperparathyroidism of renal origin: Secondary | ICD-10-CM | POA: Diagnosis not present

## 2022-03-28 DIAGNOSIS — E875 Hyperkalemia: Secondary | ICD-10-CM | POA: Diagnosis not present

## 2022-03-28 DIAGNOSIS — N2581 Secondary hyperparathyroidism of renal origin: Secondary | ICD-10-CM | POA: Diagnosis not present

## 2022-03-28 DIAGNOSIS — D631 Anemia in chronic kidney disease: Secondary | ICD-10-CM | POA: Diagnosis not present

## 2022-03-28 DIAGNOSIS — E1122 Type 2 diabetes mellitus with diabetic chronic kidney disease: Secondary | ICD-10-CM | POA: Diagnosis not present

## 2022-03-28 DIAGNOSIS — D509 Iron deficiency anemia, unspecified: Secondary | ICD-10-CM | POA: Diagnosis not present

## 2022-03-28 DIAGNOSIS — Z992 Dependence on renal dialysis: Secondary | ICD-10-CM | POA: Diagnosis not present

## 2022-03-28 DIAGNOSIS — N032 Chronic nephritic syndrome with diffuse membranous glomerulonephritis: Secondary | ICD-10-CM | POA: Diagnosis not present

## 2022-03-28 DIAGNOSIS — N186 End stage renal disease: Secondary | ICD-10-CM | POA: Diagnosis not present

## 2022-03-30 DIAGNOSIS — E875 Hyperkalemia: Secondary | ICD-10-CM | POA: Diagnosis not present

## 2022-03-30 DIAGNOSIS — D631 Anemia in chronic kidney disease: Secondary | ICD-10-CM | POA: Diagnosis not present

## 2022-03-30 DIAGNOSIS — D509 Iron deficiency anemia, unspecified: Secondary | ICD-10-CM | POA: Diagnosis not present

## 2022-03-30 DIAGNOSIS — N186 End stage renal disease: Secondary | ICD-10-CM | POA: Diagnosis not present

## 2022-03-30 DIAGNOSIS — Z992 Dependence on renal dialysis: Secondary | ICD-10-CM | POA: Diagnosis not present

## 2022-03-30 DIAGNOSIS — N2581 Secondary hyperparathyroidism of renal origin: Secondary | ICD-10-CM | POA: Diagnosis not present

## 2022-04-02 DIAGNOSIS — E875 Hyperkalemia: Secondary | ICD-10-CM | POA: Diagnosis not present

## 2022-04-02 DIAGNOSIS — N2581 Secondary hyperparathyroidism of renal origin: Secondary | ICD-10-CM | POA: Diagnosis not present

## 2022-04-02 DIAGNOSIS — D509 Iron deficiency anemia, unspecified: Secondary | ICD-10-CM | POA: Diagnosis not present

## 2022-04-02 DIAGNOSIS — Z992 Dependence on renal dialysis: Secondary | ICD-10-CM | POA: Diagnosis not present

## 2022-04-02 DIAGNOSIS — N186 End stage renal disease: Secondary | ICD-10-CM | POA: Diagnosis not present

## 2022-04-02 DIAGNOSIS — D631 Anemia in chronic kidney disease: Secondary | ICD-10-CM | POA: Diagnosis not present

## 2022-04-04 DIAGNOSIS — D631 Anemia in chronic kidney disease: Secondary | ICD-10-CM | POA: Diagnosis not present

## 2022-04-04 DIAGNOSIS — N186 End stage renal disease: Secondary | ICD-10-CM | POA: Diagnosis not present

## 2022-04-04 DIAGNOSIS — N2581 Secondary hyperparathyroidism of renal origin: Secondary | ICD-10-CM | POA: Diagnosis not present

## 2022-04-04 DIAGNOSIS — D509 Iron deficiency anemia, unspecified: Secondary | ICD-10-CM | POA: Diagnosis not present

## 2022-04-04 DIAGNOSIS — E875 Hyperkalemia: Secondary | ICD-10-CM | POA: Diagnosis not present

## 2022-04-04 DIAGNOSIS — Z992 Dependence on renal dialysis: Secondary | ICD-10-CM | POA: Diagnosis not present

## 2022-04-06 DIAGNOSIS — N186 End stage renal disease: Secondary | ICD-10-CM | POA: Diagnosis not present

## 2022-04-06 DIAGNOSIS — N2581 Secondary hyperparathyroidism of renal origin: Secondary | ICD-10-CM | POA: Diagnosis not present

## 2022-04-06 DIAGNOSIS — E875 Hyperkalemia: Secondary | ICD-10-CM | POA: Diagnosis not present

## 2022-04-06 DIAGNOSIS — Z992 Dependence on renal dialysis: Secondary | ICD-10-CM | POA: Diagnosis not present

## 2022-04-06 DIAGNOSIS — D509 Iron deficiency anemia, unspecified: Secondary | ICD-10-CM | POA: Diagnosis not present

## 2022-04-06 DIAGNOSIS — D631 Anemia in chronic kidney disease: Secondary | ICD-10-CM | POA: Diagnosis not present

## 2022-04-09 DIAGNOSIS — Z992 Dependence on renal dialysis: Secondary | ICD-10-CM | POA: Diagnosis not present

## 2022-04-09 DIAGNOSIS — N186 End stage renal disease: Secondary | ICD-10-CM | POA: Diagnosis not present

## 2022-04-09 DIAGNOSIS — E875 Hyperkalemia: Secondary | ICD-10-CM | POA: Diagnosis not present

## 2022-04-09 DIAGNOSIS — D631 Anemia in chronic kidney disease: Secondary | ICD-10-CM | POA: Diagnosis not present

## 2022-04-09 DIAGNOSIS — N2581 Secondary hyperparathyroidism of renal origin: Secondary | ICD-10-CM | POA: Diagnosis not present

## 2022-04-09 DIAGNOSIS — D509 Iron deficiency anemia, unspecified: Secondary | ICD-10-CM | POA: Diagnosis not present

## 2022-04-11 ENCOUNTER — Ambulatory Visit (INDEPENDENT_AMBULATORY_CARE_PROVIDER_SITE_OTHER): Payer: Medicare Other

## 2022-04-11 DIAGNOSIS — D631 Anemia in chronic kidney disease: Secondary | ICD-10-CM | POA: Diagnosis not present

## 2022-04-11 DIAGNOSIS — E875 Hyperkalemia: Secondary | ICD-10-CM | POA: Diagnosis not present

## 2022-04-11 DIAGNOSIS — I255 Ischemic cardiomyopathy: Secondary | ICD-10-CM | POA: Diagnosis not present

## 2022-04-11 DIAGNOSIS — Z992 Dependence on renal dialysis: Secondary | ICD-10-CM | POA: Diagnosis not present

## 2022-04-11 DIAGNOSIS — N2581 Secondary hyperparathyroidism of renal origin: Secondary | ICD-10-CM | POA: Diagnosis not present

## 2022-04-11 DIAGNOSIS — N186 End stage renal disease: Secondary | ICD-10-CM | POA: Diagnosis not present

## 2022-04-11 DIAGNOSIS — D509 Iron deficiency anemia, unspecified: Secondary | ICD-10-CM | POA: Diagnosis not present

## 2022-04-13 DIAGNOSIS — D509 Iron deficiency anemia, unspecified: Secondary | ICD-10-CM | POA: Diagnosis not present

## 2022-04-13 DIAGNOSIS — N2581 Secondary hyperparathyroidism of renal origin: Secondary | ICD-10-CM | POA: Diagnosis not present

## 2022-04-13 DIAGNOSIS — E875 Hyperkalemia: Secondary | ICD-10-CM | POA: Diagnosis not present

## 2022-04-13 DIAGNOSIS — D631 Anemia in chronic kidney disease: Secondary | ICD-10-CM | POA: Diagnosis not present

## 2022-04-13 DIAGNOSIS — N186 End stage renal disease: Secondary | ICD-10-CM | POA: Diagnosis not present

## 2022-04-13 DIAGNOSIS — Z992 Dependence on renal dialysis: Secondary | ICD-10-CM | POA: Diagnosis not present

## 2022-04-13 LAB — CUP PACEART REMOTE DEVICE CHECK
Battery Remaining Longevity: 75 mo
Battery Voltage: 2.97 V
Brady Statistic RV Percent Paced: 0.01 %
Date Time Interrogation Session: 20230616144415
HighPow Impedance: 254 Ohm
HighPow Impedance: 62 Ohm
Implantable Lead Implant Date: 20210315
Implantable Lead Location: 753860
Implantable Lead Model: 6935
Implantable Pulse Generator Implant Date: 20190208
Lead Channel Impedance Value: 342 Ohm
Lead Channel Impedance Value: 418 Ohm
Lead Channel Pacing Threshold Amplitude: 0.5 V
Lead Channel Pacing Threshold Pulse Width: 0.4 ms
Lead Channel Sensing Intrinsic Amplitude: 12.125 mV
Lead Channel Sensing Intrinsic Amplitude: 12.125 mV
Lead Channel Setting Pacing Amplitude: 2 V
Lead Channel Setting Pacing Pulse Width: 0.4 ms
Lead Channel Setting Sensing Sensitivity: 0.3 mV

## 2022-04-16 DIAGNOSIS — Z992 Dependence on renal dialysis: Secondary | ICD-10-CM | POA: Diagnosis not present

## 2022-04-16 DIAGNOSIS — D631 Anemia in chronic kidney disease: Secondary | ICD-10-CM | POA: Diagnosis not present

## 2022-04-16 DIAGNOSIS — N186 End stage renal disease: Secondary | ICD-10-CM | POA: Diagnosis not present

## 2022-04-16 DIAGNOSIS — E875 Hyperkalemia: Secondary | ICD-10-CM | POA: Diagnosis not present

## 2022-04-16 DIAGNOSIS — N2581 Secondary hyperparathyroidism of renal origin: Secondary | ICD-10-CM | POA: Diagnosis not present

## 2022-04-16 DIAGNOSIS — D509 Iron deficiency anemia, unspecified: Secondary | ICD-10-CM | POA: Diagnosis not present

## 2022-04-17 DIAGNOSIS — M3130 Wegener's granulomatosis without renal involvement: Secondary | ICD-10-CM | POA: Diagnosis not present

## 2022-04-17 DIAGNOSIS — Z9889 Other specified postprocedural states: Secondary | ICD-10-CM | POA: Diagnosis not present

## 2022-04-17 DIAGNOSIS — I7782 Antineutrophilic cytoplasmic antibody (ANCA) vasculitis: Secondary | ICD-10-CM | POA: Diagnosis not present

## 2022-04-17 DIAGNOSIS — Z79899 Other long term (current) drug therapy: Secondary | ICD-10-CM | POA: Diagnosis not present

## 2022-04-17 DIAGNOSIS — Z79620 Long term (current) use of immunosuppressive biologic: Secondary | ICD-10-CM | POA: Diagnosis not present

## 2022-04-17 DIAGNOSIS — Z8669 Personal history of other diseases of the nervous system and sense organs: Secondary | ICD-10-CM | POA: Diagnosis not present

## 2022-04-17 DIAGNOSIS — Z8615 Personal history of latent tuberculosis infection: Secondary | ICD-10-CM | POA: Diagnosis not present

## 2022-04-17 DIAGNOSIS — M3131 Wegener's granulomatosis with renal involvement: Secondary | ICD-10-CM | POA: Diagnosis not present

## 2022-04-17 DIAGNOSIS — Z5181 Encounter for therapeutic drug level monitoring: Secondary | ICD-10-CM | POA: Diagnosis not present

## 2022-04-18 DIAGNOSIS — N186 End stage renal disease: Secondary | ICD-10-CM | POA: Diagnosis not present

## 2022-04-18 DIAGNOSIS — N2581 Secondary hyperparathyroidism of renal origin: Secondary | ICD-10-CM | POA: Diagnosis not present

## 2022-04-18 DIAGNOSIS — E875 Hyperkalemia: Secondary | ICD-10-CM | POA: Diagnosis not present

## 2022-04-18 DIAGNOSIS — D509 Iron deficiency anemia, unspecified: Secondary | ICD-10-CM | POA: Diagnosis not present

## 2022-04-18 DIAGNOSIS — Z992 Dependence on renal dialysis: Secondary | ICD-10-CM | POA: Diagnosis not present

## 2022-04-18 DIAGNOSIS — D631 Anemia in chronic kidney disease: Secondary | ICD-10-CM | POA: Diagnosis not present

## 2022-04-20 DIAGNOSIS — E875 Hyperkalemia: Secondary | ICD-10-CM | POA: Diagnosis not present

## 2022-04-20 DIAGNOSIS — N186 End stage renal disease: Secondary | ICD-10-CM | POA: Diagnosis not present

## 2022-04-20 DIAGNOSIS — D509 Iron deficiency anemia, unspecified: Secondary | ICD-10-CM | POA: Diagnosis not present

## 2022-04-20 DIAGNOSIS — N2581 Secondary hyperparathyroidism of renal origin: Secondary | ICD-10-CM | POA: Diagnosis not present

## 2022-04-20 DIAGNOSIS — Z992 Dependence on renal dialysis: Secondary | ICD-10-CM | POA: Diagnosis not present

## 2022-04-20 DIAGNOSIS — D631 Anemia in chronic kidney disease: Secondary | ICD-10-CM | POA: Diagnosis not present

## 2022-04-23 DIAGNOSIS — E875 Hyperkalemia: Secondary | ICD-10-CM | POA: Diagnosis not present

## 2022-04-23 DIAGNOSIS — N2581 Secondary hyperparathyroidism of renal origin: Secondary | ICD-10-CM | POA: Diagnosis not present

## 2022-04-23 DIAGNOSIS — N186 End stage renal disease: Secondary | ICD-10-CM | POA: Diagnosis not present

## 2022-04-23 DIAGNOSIS — Z992 Dependence on renal dialysis: Secondary | ICD-10-CM | POA: Diagnosis not present

## 2022-04-23 DIAGNOSIS — D631 Anemia in chronic kidney disease: Secondary | ICD-10-CM | POA: Diagnosis not present

## 2022-04-23 DIAGNOSIS — D509 Iron deficiency anemia, unspecified: Secondary | ICD-10-CM | POA: Diagnosis not present

## 2022-04-25 DIAGNOSIS — N186 End stage renal disease: Secondary | ICD-10-CM | POA: Diagnosis not present

## 2022-04-25 DIAGNOSIS — N2581 Secondary hyperparathyroidism of renal origin: Secondary | ICD-10-CM | POA: Diagnosis not present

## 2022-04-25 DIAGNOSIS — D631 Anemia in chronic kidney disease: Secondary | ICD-10-CM | POA: Diagnosis not present

## 2022-04-25 DIAGNOSIS — E875 Hyperkalemia: Secondary | ICD-10-CM | POA: Diagnosis not present

## 2022-04-25 DIAGNOSIS — D509 Iron deficiency anemia, unspecified: Secondary | ICD-10-CM | POA: Diagnosis not present

## 2022-04-25 DIAGNOSIS — Z992 Dependence on renal dialysis: Secondary | ICD-10-CM | POA: Diagnosis not present

## 2022-04-27 DIAGNOSIS — E1122 Type 2 diabetes mellitus with diabetic chronic kidney disease: Secondary | ICD-10-CM | POA: Diagnosis not present

## 2022-04-27 DIAGNOSIS — N186 End stage renal disease: Secondary | ICD-10-CM | POA: Diagnosis not present

## 2022-04-27 DIAGNOSIS — N032 Chronic nephritic syndrome with diffuse membranous glomerulonephritis: Secondary | ICD-10-CM | POA: Diagnosis not present

## 2022-04-27 DIAGNOSIS — D509 Iron deficiency anemia, unspecified: Secondary | ICD-10-CM | POA: Diagnosis not present

## 2022-04-27 DIAGNOSIS — E875 Hyperkalemia: Secondary | ICD-10-CM | POA: Diagnosis not present

## 2022-04-27 DIAGNOSIS — Z992 Dependence on renal dialysis: Secondary | ICD-10-CM | POA: Diagnosis not present

## 2022-04-27 DIAGNOSIS — D631 Anemia in chronic kidney disease: Secondary | ICD-10-CM | POA: Diagnosis not present

## 2022-04-27 DIAGNOSIS — N2581 Secondary hyperparathyroidism of renal origin: Secondary | ICD-10-CM | POA: Diagnosis not present

## 2022-04-30 DIAGNOSIS — N186 End stage renal disease: Secondary | ICD-10-CM | POA: Diagnosis not present

## 2022-04-30 DIAGNOSIS — N2581 Secondary hyperparathyroidism of renal origin: Secondary | ICD-10-CM | POA: Diagnosis not present

## 2022-04-30 DIAGNOSIS — D631 Anemia in chronic kidney disease: Secondary | ICD-10-CM | POA: Diagnosis not present

## 2022-04-30 DIAGNOSIS — E875 Hyperkalemia: Secondary | ICD-10-CM | POA: Diagnosis not present

## 2022-04-30 DIAGNOSIS — D509 Iron deficiency anemia, unspecified: Secondary | ICD-10-CM | POA: Diagnosis not present

## 2022-04-30 DIAGNOSIS — Z992 Dependence on renal dialysis: Secondary | ICD-10-CM | POA: Diagnosis not present

## 2022-05-02 DIAGNOSIS — D509 Iron deficiency anemia, unspecified: Secondary | ICD-10-CM | POA: Diagnosis not present

## 2022-05-02 DIAGNOSIS — Z992 Dependence on renal dialysis: Secondary | ICD-10-CM | POA: Diagnosis not present

## 2022-05-02 DIAGNOSIS — E875 Hyperkalemia: Secondary | ICD-10-CM | POA: Diagnosis not present

## 2022-05-02 DIAGNOSIS — N186 End stage renal disease: Secondary | ICD-10-CM | POA: Diagnosis not present

## 2022-05-02 DIAGNOSIS — D631 Anemia in chronic kidney disease: Secondary | ICD-10-CM | POA: Diagnosis not present

## 2022-05-02 DIAGNOSIS — N2581 Secondary hyperparathyroidism of renal origin: Secondary | ICD-10-CM | POA: Diagnosis not present

## 2022-05-04 DIAGNOSIS — E875 Hyperkalemia: Secondary | ICD-10-CM | POA: Diagnosis not present

## 2022-05-04 DIAGNOSIS — D509 Iron deficiency anemia, unspecified: Secondary | ICD-10-CM | POA: Diagnosis not present

## 2022-05-04 DIAGNOSIS — Z992 Dependence on renal dialysis: Secondary | ICD-10-CM | POA: Diagnosis not present

## 2022-05-04 DIAGNOSIS — N2581 Secondary hyperparathyroidism of renal origin: Secondary | ICD-10-CM | POA: Diagnosis not present

## 2022-05-04 DIAGNOSIS — D631 Anemia in chronic kidney disease: Secondary | ICD-10-CM | POA: Diagnosis not present

## 2022-05-04 DIAGNOSIS — N186 End stage renal disease: Secondary | ICD-10-CM | POA: Diagnosis not present

## 2022-05-07 DIAGNOSIS — E875 Hyperkalemia: Secondary | ICD-10-CM | POA: Diagnosis not present

## 2022-05-07 DIAGNOSIS — Z992 Dependence on renal dialysis: Secondary | ICD-10-CM | POA: Diagnosis not present

## 2022-05-07 DIAGNOSIS — N2581 Secondary hyperparathyroidism of renal origin: Secondary | ICD-10-CM | POA: Diagnosis not present

## 2022-05-07 DIAGNOSIS — D509 Iron deficiency anemia, unspecified: Secondary | ICD-10-CM | POA: Diagnosis not present

## 2022-05-07 DIAGNOSIS — N186 End stage renal disease: Secondary | ICD-10-CM | POA: Diagnosis not present

## 2022-05-07 DIAGNOSIS — D631 Anemia in chronic kidney disease: Secondary | ICD-10-CM | POA: Diagnosis not present

## 2022-05-09 DIAGNOSIS — D509 Iron deficiency anemia, unspecified: Secondary | ICD-10-CM | POA: Diagnosis not present

## 2022-05-09 DIAGNOSIS — E875 Hyperkalemia: Secondary | ICD-10-CM | POA: Diagnosis not present

## 2022-05-09 DIAGNOSIS — N2581 Secondary hyperparathyroidism of renal origin: Secondary | ICD-10-CM | POA: Diagnosis not present

## 2022-05-09 DIAGNOSIS — Z992 Dependence on renal dialysis: Secondary | ICD-10-CM | POA: Diagnosis not present

## 2022-05-09 DIAGNOSIS — D631 Anemia in chronic kidney disease: Secondary | ICD-10-CM | POA: Diagnosis not present

## 2022-05-09 DIAGNOSIS — N186 End stage renal disease: Secondary | ICD-10-CM | POA: Diagnosis not present

## 2022-05-11 DIAGNOSIS — E875 Hyperkalemia: Secondary | ICD-10-CM | POA: Diagnosis not present

## 2022-05-11 DIAGNOSIS — D509 Iron deficiency anemia, unspecified: Secondary | ICD-10-CM | POA: Diagnosis not present

## 2022-05-11 DIAGNOSIS — D631 Anemia in chronic kidney disease: Secondary | ICD-10-CM | POA: Diagnosis not present

## 2022-05-11 DIAGNOSIS — N2581 Secondary hyperparathyroidism of renal origin: Secondary | ICD-10-CM | POA: Diagnosis not present

## 2022-05-11 DIAGNOSIS — N186 End stage renal disease: Secondary | ICD-10-CM | POA: Diagnosis not present

## 2022-05-11 DIAGNOSIS — Z992 Dependence on renal dialysis: Secondary | ICD-10-CM | POA: Diagnosis not present

## 2022-05-14 DIAGNOSIS — N2581 Secondary hyperparathyroidism of renal origin: Secondary | ICD-10-CM | POA: Diagnosis not present

## 2022-05-14 DIAGNOSIS — D509 Iron deficiency anemia, unspecified: Secondary | ICD-10-CM | POA: Diagnosis not present

## 2022-05-14 DIAGNOSIS — D631 Anemia in chronic kidney disease: Secondary | ICD-10-CM | POA: Diagnosis not present

## 2022-05-14 DIAGNOSIS — N186 End stage renal disease: Secondary | ICD-10-CM | POA: Diagnosis not present

## 2022-05-14 DIAGNOSIS — Z992 Dependence on renal dialysis: Secondary | ICD-10-CM | POA: Diagnosis not present

## 2022-05-14 DIAGNOSIS — E875 Hyperkalemia: Secondary | ICD-10-CM | POA: Diagnosis not present

## 2022-05-16 DIAGNOSIS — D631 Anemia in chronic kidney disease: Secondary | ICD-10-CM | POA: Diagnosis not present

## 2022-05-16 DIAGNOSIS — N2581 Secondary hyperparathyroidism of renal origin: Secondary | ICD-10-CM | POA: Diagnosis not present

## 2022-05-16 DIAGNOSIS — Z992 Dependence on renal dialysis: Secondary | ICD-10-CM | POA: Diagnosis not present

## 2022-05-16 DIAGNOSIS — E875 Hyperkalemia: Secondary | ICD-10-CM | POA: Diagnosis not present

## 2022-05-16 DIAGNOSIS — N186 End stage renal disease: Secondary | ICD-10-CM | POA: Diagnosis not present

## 2022-05-16 DIAGNOSIS — E1122 Type 2 diabetes mellitus with diabetic chronic kidney disease: Secondary | ICD-10-CM | POA: Diagnosis not present

## 2022-05-16 DIAGNOSIS — D509 Iron deficiency anemia, unspecified: Secondary | ICD-10-CM | POA: Diagnosis not present

## 2022-05-18 DIAGNOSIS — Z992 Dependence on renal dialysis: Secondary | ICD-10-CM | POA: Diagnosis not present

## 2022-05-18 DIAGNOSIS — N186 End stage renal disease: Secondary | ICD-10-CM | POA: Diagnosis not present

## 2022-05-18 DIAGNOSIS — D509 Iron deficiency anemia, unspecified: Secondary | ICD-10-CM | POA: Diagnosis not present

## 2022-05-18 DIAGNOSIS — N2581 Secondary hyperparathyroidism of renal origin: Secondary | ICD-10-CM | POA: Diagnosis not present

## 2022-05-18 DIAGNOSIS — D631 Anemia in chronic kidney disease: Secondary | ICD-10-CM | POA: Diagnosis not present

## 2022-05-18 DIAGNOSIS — E875 Hyperkalemia: Secondary | ICD-10-CM | POA: Diagnosis not present

## 2022-05-21 DIAGNOSIS — D509 Iron deficiency anemia, unspecified: Secondary | ICD-10-CM | POA: Diagnosis not present

## 2022-05-21 DIAGNOSIS — N186 End stage renal disease: Secondary | ICD-10-CM | POA: Diagnosis not present

## 2022-05-21 DIAGNOSIS — D631 Anemia in chronic kidney disease: Secondary | ICD-10-CM | POA: Diagnosis not present

## 2022-05-21 DIAGNOSIS — N2581 Secondary hyperparathyroidism of renal origin: Secondary | ICD-10-CM | POA: Diagnosis not present

## 2022-05-21 DIAGNOSIS — Z992 Dependence on renal dialysis: Secondary | ICD-10-CM | POA: Diagnosis not present

## 2022-05-21 DIAGNOSIS — E875 Hyperkalemia: Secondary | ICD-10-CM | POA: Diagnosis not present

## 2022-05-23 DIAGNOSIS — N186 End stage renal disease: Secondary | ICD-10-CM | POA: Diagnosis not present

## 2022-05-23 DIAGNOSIS — D509 Iron deficiency anemia, unspecified: Secondary | ICD-10-CM | POA: Diagnosis not present

## 2022-05-23 DIAGNOSIS — D631 Anemia in chronic kidney disease: Secondary | ICD-10-CM | POA: Diagnosis not present

## 2022-05-23 DIAGNOSIS — Z992 Dependence on renal dialysis: Secondary | ICD-10-CM | POA: Diagnosis not present

## 2022-05-23 DIAGNOSIS — N2581 Secondary hyperparathyroidism of renal origin: Secondary | ICD-10-CM | POA: Diagnosis not present

## 2022-05-23 DIAGNOSIS — E875 Hyperkalemia: Secondary | ICD-10-CM | POA: Diagnosis not present

## 2022-05-25 DIAGNOSIS — D509 Iron deficiency anemia, unspecified: Secondary | ICD-10-CM | POA: Diagnosis not present

## 2022-05-25 DIAGNOSIS — D631 Anemia in chronic kidney disease: Secondary | ICD-10-CM | POA: Diagnosis not present

## 2022-05-25 DIAGNOSIS — Z992 Dependence on renal dialysis: Secondary | ICD-10-CM | POA: Diagnosis not present

## 2022-05-25 DIAGNOSIS — N186 End stage renal disease: Secondary | ICD-10-CM | POA: Diagnosis not present

## 2022-05-25 DIAGNOSIS — N2581 Secondary hyperparathyroidism of renal origin: Secondary | ICD-10-CM | POA: Diagnosis not present

## 2022-05-25 DIAGNOSIS — E875 Hyperkalemia: Secondary | ICD-10-CM | POA: Diagnosis not present

## 2022-05-28 DIAGNOSIS — E875 Hyperkalemia: Secondary | ICD-10-CM | POA: Diagnosis not present

## 2022-05-28 DIAGNOSIS — Z992 Dependence on renal dialysis: Secondary | ICD-10-CM | POA: Diagnosis not present

## 2022-05-28 DIAGNOSIS — D631 Anemia in chronic kidney disease: Secondary | ICD-10-CM | POA: Diagnosis not present

## 2022-05-28 DIAGNOSIS — D689 Coagulation defect, unspecified: Secondary | ICD-10-CM | POA: Diagnosis not present

## 2022-05-28 DIAGNOSIS — N2581 Secondary hyperparathyroidism of renal origin: Secondary | ICD-10-CM | POA: Diagnosis not present

## 2022-05-28 DIAGNOSIS — D509 Iron deficiency anemia, unspecified: Secondary | ICD-10-CM | POA: Diagnosis not present

## 2022-05-28 DIAGNOSIS — E1122 Type 2 diabetes mellitus with diabetic chronic kidney disease: Secondary | ICD-10-CM | POA: Diagnosis not present

## 2022-05-28 DIAGNOSIS — N186 End stage renal disease: Secondary | ICD-10-CM | POA: Diagnosis not present

## 2022-05-30 DIAGNOSIS — Z992 Dependence on renal dialysis: Secondary | ICD-10-CM | POA: Diagnosis not present

## 2022-05-30 DIAGNOSIS — D689 Coagulation defect, unspecified: Secondary | ICD-10-CM | POA: Diagnosis not present

## 2022-05-30 DIAGNOSIS — N186 End stage renal disease: Secondary | ICD-10-CM | POA: Diagnosis not present

## 2022-05-30 DIAGNOSIS — D631 Anemia in chronic kidney disease: Secondary | ICD-10-CM | POA: Diagnosis not present

## 2022-05-30 DIAGNOSIS — N2581 Secondary hyperparathyroidism of renal origin: Secondary | ICD-10-CM | POA: Diagnosis not present

## 2022-05-30 DIAGNOSIS — D509 Iron deficiency anemia, unspecified: Secondary | ICD-10-CM | POA: Diagnosis not present

## 2022-05-30 DIAGNOSIS — E1122 Type 2 diabetes mellitus with diabetic chronic kidney disease: Secondary | ICD-10-CM | POA: Diagnosis not present

## 2022-05-30 DIAGNOSIS — E875 Hyperkalemia: Secondary | ICD-10-CM | POA: Diagnosis not present

## 2022-06-01 DIAGNOSIS — D689 Coagulation defect, unspecified: Secondary | ICD-10-CM | POA: Diagnosis not present

## 2022-06-01 DIAGNOSIS — Z992 Dependence on renal dialysis: Secondary | ICD-10-CM | POA: Diagnosis not present

## 2022-06-01 DIAGNOSIS — D509 Iron deficiency anemia, unspecified: Secondary | ICD-10-CM | POA: Diagnosis not present

## 2022-06-01 DIAGNOSIS — N186 End stage renal disease: Secondary | ICD-10-CM | POA: Diagnosis not present

## 2022-06-01 DIAGNOSIS — D631 Anemia in chronic kidney disease: Secondary | ICD-10-CM | POA: Diagnosis not present

## 2022-06-01 DIAGNOSIS — E1122 Type 2 diabetes mellitus with diabetic chronic kidney disease: Secondary | ICD-10-CM | POA: Diagnosis not present

## 2022-06-01 DIAGNOSIS — N2581 Secondary hyperparathyroidism of renal origin: Secondary | ICD-10-CM | POA: Diagnosis not present

## 2022-06-01 DIAGNOSIS — E875 Hyperkalemia: Secondary | ICD-10-CM | POA: Diagnosis not present

## 2022-06-04 DIAGNOSIS — D631 Anemia in chronic kidney disease: Secondary | ICD-10-CM | POA: Diagnosis not present

## 2022-06-04 DIAGNOSIS — E1122 Type 2 diabetes mellitus with diabetic chronic kidney disease: Secondary | ICD-10-CM | POA: Diagnosis not present

## 2022-06-04 DIAGNOSIS — N2581 Secondary hyperparathyroidism of renal origin: Secondary | ICD-10-CM | POA: Diagnosis not present

## 2022-06-04 DIAGNOSIS — N186 End stage renal disease: Secondary | ICD-10-CM | POA: Diagnosis not present

## 2022-06-04 DIAGNOSIS — D509 Iron deficiency anemia, unspecified: Secondary | ICD-10-CM | POA: Diagnosis not present

## 2022-06-04 DIAGNOSIS — E875 Hyperkalemia: Secondary | ICD-10-CM | POA: Diagnosis not present

## 2022-06-04 DIAGNOSIS — Z992 Dependence on renal dialysis: Secondary | ICD-10-CM | POA: Diagnosis not present

## 2022-06-04 DIAGNOSIS — D689 Coagulation defect, unspecified: Secondary | ICD-10-CM | POA: Diagnosis not present

## 2022-06-06 DIAGNOSIS — E875 Hyperkalemia: Secondary | ICD-10-CM | POA: Diagnosis not present

## 2022-06-06 DIAGNOSIS — D631 Anemia in chronic kidney disease: Secondary | ICD-10-CM | POA: Diagnosis not present

## 2022-06-06 DIAGNOSIS — E1122 Type 2 diabetes mellitus with diabetic chronic kidney disease: Secondary | ICD-10-CM | POA: Diagnosis not present

## 2022-06-06 DIAGNOSIS — N2581 Secondary hyperparathyroidism of renal origin: Secondary | ICD-10-CM | POA: Diagnosis not present

## 2022-06-06 DIAGNOSIS — D509 Iron deficiency anemia, unspecified: Secondary | ICD-10-CM | POA: Diagnosis not present

## 2022-06-06 DIAGNOSIS — D689 Coagulation defect, unspecified: Secondary | ICD-10-CM | POA: Diagnosis not present

## 2022-06-06 DIAGNOSIS — N186 End stage renal disease: Secondary | ICD-10-CM | POA: Diagnosis not present

## 2022-06-06 DIAGNOSIS — Z992 Dependence on renal dialysis: Secondary | ICD-10-CM | POA: Diagnosis not present

## 2022-06-07 DIAGNOSIS — M3131 Wegener's granulomatosis with renal involvement: Secondary | ICD-10-CM | POA: Diagnosis not present

## 2022-06-07 DIAGNOSIS — I7782 Antineutrophilic cytoplasmic antibody (ANCA) vasculitis: Secondary | ICD-10-CM | POA: Diagnosis not present

## 2022-06-07 DIAGNOSIS — H0589 Other disorders of orbit: Secondary | ICD-10-CM | POA: Diagnosis not present

## 2022-06-07 DIAGNOSIS — H0520 Unspecified exophthalmos: Secondary | ICD-10-CM | POA: Diagnosis not present

## 2022-06-08 DIAGNOSIS — D631 Anemia in chronic kidney disease: Secondary | ICD-10-CM | POA: Diagnosis not present

## 2022-06-08 DIAGNOSIS — E1122 Type 2 diabetes mellitus with diabetic chronic kidney disease: Secondary | ICD-10-CM | POA: Diagnosis not present

## 2022-06-08 DIAGNOSIS — N2581 Secondary hyperparathyroidism of renal origin: Secondary | ICD-10-CM | POA: Diagnosis not present

## 2022-06-08 DIAGNOSIS — D509 Iron deficiency anemia, unspecified: Secondary | ICD-10-CM | POA: Diagnosis not present

## 2022-06-08 DIAGNOSIS — N186 End stage renal disease: Secondary | ICD-10-CM | POA: Diagnosis not present

## 2022-06-08 DIAGNOSIS — E875 Hyperkalemia: Secondary | ICD-10-CM | POA: Diagnosis not present

## 2022-06-08 DIAGNOSIS — Z992 Dependence on renal dialysis: Secondary | ICD-10-CM | POA: Diagnosis not present

## 2022-06-08 DIAGNOSIS — D689 Coagulation defect, unspecified: Secondary | ICD-10-CM | POA: Diagnosis not present

## 2022-06-11 DIAGNOSIS — D509 Iron deficiency anemia, unspecified: Secondary | ICD-10-CM | POA: Diagnosis not present

## 2022-06-11 DIAGNOSIS — D631 Anemia in chronic kidney disease: Secondary | ICD-10-CM | POA: Diagnosis not present

## 2022-06-11 DIAGNOSIS — E875 Hyperkalemia: Secondary | ICD-10-CM | POA: Diagnosis not present

## 2022-06-11 DIAGNOSIS — D689 Coagulation defect, unspecified: Secondary | ICD-10-CM | POA: Diagnosis not present

## 2022-06-11 DIAGNOSIS — N2581 Secondary hyperparathyroidism of renal origin: Secondary | ICD-10-CM | POA: Diagnosis not present

## 2022-06-11 DIAGNOSIS — E1122 Type 2 diabetes mellitus with diabetic chronic kidney disease: Secondary | ICD-10-CM | POA: Diagnosis not present

## 2022-06-11 DIAGNOSIS — Z992 Dependence on renal dialysis: Secondary | ICD-10-CM | POA: Diagnosis not present

## 2022-06-11 DIAGNOSIS — N186 End stage renal disease: Secondary | ICD-10-CM | POA: Diagnosis not present

## 2022-06-13 DIAGNOSIS — D631 Anemia in chronic kidney disease: Secondary | ICD-10-CM | POA: Diagnosis not present

## 2022-06-13 DIAGNOSIS — D509 Iron deficiency anemia, unspecified: Secondary | ICD-10-CM | POA: Diagnosis not present

## 2022-06-13 DIAGNOSIS — N186 End stage renal disease: Secondary | ICD-10-CM | POA: Diagnosis not present

## 2022-06-13 DIAGNOSIS — D689 Coagulation defect, unspecified: Secondary | ICD-10-CM | POA: Diagnosis not present

## 2022-06-13 DIAGNOSIS — E875 Hyperkalemia: Secondary | ICD-10-CM | POA: Diagnosis not present

## 2022-06-13 DIAGNOSIS — Z992 Dependence on renal dialysis: Secondary | ICD-10-CM | POA: Diagnosis not present

## 2022-06-13 DIAGNOSIS — E1122 Type 2 diabetes mellitus with diabetic chronic kidney disease: Secondary | ICD-10-CM | POA: Diagnosis not present

## 2022-06-13 DIAGNOSIS — N2581 Secondary hyperparathyroidism of renal origin: Secondary | ICD-10-CM | POA: Diagnosis not present

## 2022-06-15 DIAGNOSIS — D689 Coagulation defect, unspecified: Secondary | ICD-10-CM | POA: Diagnosis not present

## 2022-06-15 DIAGNOSIS — N186 End stage renal disease: Secondary | ICD-10-CM | POA: Diagnosis not present

## 2022-06-15 DIAGNOSIS — E875 Hyperkalemia: Secondary | ICD-10-CM | POA: Diagnosis not present

## 2022-06-15 DIAGNOSIS — D509 Iron deficiency anemia, unspecified: Secondary | ICD-10-CM | POA: Diagnosis not present

## 2022-06-15 DIAGNOSIS — D631 Anemia in chronic kidney disease: Secondary | ICD-10-CM | POA: Diagnosis not present

## 2022-06-15 DIAGNOSIS — Z992 Dependence on renal dialysis: Secondary | ICD-10-CM | POA: Diagnosis not present

## 2022-06-15 DIAGNOSIS — N2581 Secondary hyperparathyroidism of renal origin: Secondary | ICD-10-CM | POA: Diagnosis not present

## 2022-06-15 DIAGNOSIS — E1122 Type 2 diabetes mellitus with diabetic chronic kidney disease: Secondary | ICD-10-CM | POA: Diagnosis not present

## 2022-06-18 DIAGNOSIS — E875 Hyperkalemia: Secondary | ICD-10-CM | POA: Diagnosis not present

## 2022-06-18 DIAGNOSIS — D631 Anemia in chronic kidney disease: Secondary | ICD-10-CM | POA: Diagnosis not present

## 2022-06-18 DIAGNOSIS — Z992 Dependence on renal dialysis: Secondary | ICD-10-CM | POA: Diagnosis not present

## 2022-06-18 DIAGNOSIS — N2581 Secondary hyperparathyroidism of renal origin: Secondary | ICD-10-CM | POA: Diagnosis not present

## 2022-06-18 DIAGNOSIS — D689 Coagulation defect, unspecified: Secondary | ICD-10-CM | POA: Diagnosis not present

## 2022-06-18 DIAGNOSIS — E1122 Type 2 diabetes mellitus with diabetic chronic kidney disease: Secondary | ICD-10-CM | POA: Diagnosis not present

## 2022-06-18 DIAGNOSIS — N186 End stage renal disease: Secondary | ICD-10-CM | POA: Diagnosis not present

## 2022-06-18 DIAGNOSIS — D509 Iron deficiency anemia, unspecified: Secondary | ICD-10-CM | POA: Diagnosis not present

## 2022-06-20 DIAGNOSIS — N186 End stage renal disease: Secondary | ICD-10-CM | POA: Diagnosis not present

## 2022-06-20 DIAGNOSIS — D689 Coagulation defect, unspecified: Secondary | ICD-10-CM | POA: Diagnosis not present

## 2022-06-20 DIAGNOSIS — D631 Anemia in chronic kidney disease: Secondary | ICD-10-CM | POA: Diagnosis not present

## 2022-06-20 DIAGNOSIS — Z992 Dependence on renal dialysis: Secondary | ICD-10-CM | POA: Diagnosis not present

## 2022-06-20 DIAGNOSIS — N2581 Secondary hyperparathyroidism of renal origin: Secondary | ICD-10-CM | POA: Diagnosis not present

## 2022-06-20 DIAGNOSIS — D509 Iron deficiency anemia, unspecified: Secondary | ICD-10-CM | POA: Diagnosis not present

## 2022-06-20 DIAGNOSIS — E1122 Type 2 diabetes mellitus with diabetic chronic kidney disease: Secondary | ICD-10-CM | POA: Diagnosis not present

## 2022-06-20 DIAGNOSIS — E875 Hyperkalemia: Secondary | ICD-10-CM | POA: Diagnosis not present

## 2022-06-22 DIAGNOSIS — Z992 Dependence on renal dialysis: Secondary | ICD-10-CM | POA: Diagnosis not present

## 2022-06-22 DIAGNOSIS — D689 Coagulation defect, unspecified: Secondary | ICD-10-CM | POA: Diagnosis not present

## 2022-06-22 DIAGNOSIS — E1122 Type 2 diabetes mellitus with diabetic chronic kidney disease: Secondary | ICD-10-CM | POA: Diagnosis not present

## 2022-06-22 DIAGNOSIS — D509 Iron deficiency anemia, unspecified: Secondary | ICD-10-CM | POA: Diagnosis not present

## 2022-06-22 DIAGNOSIS — N2581 Secondary hyperparathyroidism of renal origin: Secondary | ICD-10-CM | POA: Diagnosis not present

## 2022-06-22 DIAGNOSIS — E875 Hyperkalemia: Secondary | ICD-10-CM | POA: Diagnosis not present

## 2022-06-22 DIAGNOSIS — N186 End stage renal disease: Secondary | ICD-10-CM | POA: Diagnosis not present

## 2022-06-22 DIAGNOSIS — D631 Anemia in chronic kidney disease: Secondary | ICD-10-CM | POA: Diagnosis not present

## 2022-06-25 DIAGNOSIS — E875 Hyperkalemia: Secondary | ICD-10-CM | POA: Diagnosis not present

## 2022-06-25 DIAGNOSIS — N186 End stage renal disease: Secondary | ICD-10-CM | POA: Diagnosis not present

## 2022-06-25 DIAGNOSIS — D689 Coagulation defect, unspecified: Secondary | ICD-10-CM | POA: Diagnosis not present

## 2022-06-25 DIAGNOSIS — E1122 Type 2 diabetes mellitus with diabetic chronic kidney disease: Secondary | ICD-10-CM | POA: Diagnosis not present

## 2022-06-25 DIAGNOSIS — Z992 Dependence on renal dialysis: Secondary | ICD-10-CM | POA: Diagnosis not present

## 2022-06-25 DIAGNOSIS — D509 Iron deficiency anemia, unspecified: Secondary | ICD-10-CM | POA: Diagnosis not present

## 2022-06-25 DIAGNOSIS — N2581 Secondary hyperparathyroidism of renal origin: Secondary | ICD-10-CM | POA: Diagnosis not present

## 2022-06-25 DIAGNOSIS — D631 Anemia in chronic kidney disease: Secondary | ICD-10-CM | POA: Diagnosis not present

## 2022-06-27 DIAGNOSIS — D509 Iron deficiency anemia, unspecified: Secondary | ICD-10-CM | POA: Diagnosis not present

## 2022-06-27 DIAGNOSIS — D631 Anemia in chronic kidney disease: Secondary | ICD-10-CM | POA: Diagnosis not present

## 2022-06-27 DIAGNOSIS — N2581 Secondary hyperparathyroidism of renal origin: Secondary | ICD-10-CM | POA: Diagnosis not present

## 2022-06-27 DIAGNOSIS — D689 Coagulation defect, unspecified: Secondary | ICD-10-CM | POA: Diagnosis not present

## 2022-06-27 DIAGNOSIS — N186 End stage renal disease: Secondary | ICD-10-CM | POA: Diagnosis not present

## 2022-06-27 DIAGNOSIS — N032 Chronic nephritic syndrome with diffuse membranous glomerulonephritis: Secondary | ICD-10-CM | POA: Diagnosis not present

## 2022-06-27 DIAGNOSIS — E1122 Type 2 diabetes mellitus with diabetic chronic kidney disease: Secondary | ICD-10-CM | POA: Diagnosis not present

## 2022-06-27 DIAGNOSIS — Z992 Dependence on renal dialysis: Secondary | ICD-10-CM | POA: Diagnosis not present

## 2022-06-27 DIAGNOSIS — E875 Hyperkalemia: Secondary | ICD-10-CM | POA: Diagnosis not present

## 2022-06-29 DIAGNOSIS — D631 Anemia in chronic kidney disease: Secondary | ICD-10-CM | POA: Diagnosis not present

## 2022-06-29 DIAGNOSIS — D509 Iron deficiency anemia, unspecified: Secondary | ICD-10-CM | POA: Diagnosis not present

## 2022-06-29 DIAGNOSIS — E875 Hyperkalemia: Secondary | ICD-10-CM | POA: Diagnosis not present

## 2022-06-29 DIAGNOSIS — E1122 Type 2 diabetes mellitus with diabetic chronic kidney disease: Secondary | ICD-10-CM | POA: Diagnosis not present

## 2022-06-29 DIAGNOSIS — N2581 Secondary hyperparathyroidism of renal origin: Secondary | ICD-10-CM | POA: Diagnosis not present

## 2022-06-29 DIAGNOSIS — Z992 Dependence on renal dialysis: Secondary | ICD-10-CM | POA: Diagnosis not present

## 2022-06-29 DIAGNOSIS — D689 Coagulation defect, unspecified: Secondary | ICD-10-CM | POA: Diagnosis not present

## 2022-06-29 DIAGNOSIS — N186 End stage renal disease: Secondary | ICD-10-CM | POA: Diagnosis not present

## 2022-07-02 DIAGNOSIS — E875 Hyperkalemia: Secondary | ICD-10-CM | POA: Diagnosis not present

## 2022-07-02 DIAGNOSIS — D509 Iron deficiency anemia, unspecified: Secondary | ICD-10-CM | POA: Diagnosis not present

## 2022-07-02 DIAGNOSIS — N2581 Secondary hyperparathyroidism of renal origin: Secondary | ICD-10-CM | POA: Diagnosis not present

## 2022-07-02 DIAGNOSIS — D631 Anemia in chronic kidney disease: Secondary | ICD-10-CM | POA: Diagnosis not present

## 2022-07-02 DIAGNOSIS — N186 End stage renal disease: Secondary | ICD-10-CM | POA: Diagnosis not present

## 2022-07-02 DIAGNOSIS — D689 Coagulation defect, unspecified: Secondary | ICD-10-CM | POA: Diagnosis not present

## 2022-07-02 DIAGNOSIS — Z992 Dependence on renal dialysis: Secondary | ICD-10-CM | POA: Diagnosis not present

## 2022-07-02 DIAGNOSIS — E1122 Type 2 diabetes mellitus with diabetic chronic kidney disease: Secondary | ICD-10-CM | POA: Diagnosis not present

## 2022-07-03 ENCOUNTER — Other Ambulatory Visit: Payer: Self-pay | Admitting: Internal Medicine

## 2022-07-04 DIAGNOSIS — D631 Anemia in chronic kidney disease: Secondary | ICD-10-CM | POA: Diagnosis not present

## 2022-07-04 DIAGNOSIS — N186 End stage renal disease: Secondary | ICD-10-CM | POA: Diagnosis not present

## 2022-07-04 DIAGNOSIS — E1122 Type 2 diabetes mellitus with diabetic chronic kidney disease: Secondary | ICD-10-CM | POA: Diagnosis not present

## 2022-07-04 DIAGNOSIS — E875 Hyperkalemia: Secondary | ICD-10-CM | POA: Diagnosis not present

## 2022-07-04 DIAGNOSIS — N2581 Secondary hyperparathyroidism of renal origin: Secondary | ICD-10-CM | POA: Diagnosis not present

## 2022-07-04 DIAGNOSIS — Z992 Dependence on renal dialysis: Secondary | ICD-10-CM | POA: Diagnosis not present

## 2022-07-04 DIAGNOSIS — D689 Coagulation defect, unspecified: Secondary | ICD-10-CM | POA: Diagnosis not present

## 2022-07-04 DIAGNOSIS — D509 Iron deficiency anemia, unspecified: Secondary | ICD-10-CM | POA: Diagnosis not present

## 2022-07-06 DIAGNOSIS — D631 Anemia in chronic kidney disease: Secondary | ICD-10-CM | POA: Diagnosis not present

## 2022-07-06 DIAGNOSIS — E875 Hyperkalemia: Secondary | ICD-10-CM | POA: Diagnosis not present

## 2022-07-06 DIAGNOSIS — D509 Iron deficiency anemia, unspecified: Secondary | ICD-10-CM | POA: Diagnosis not present

## 2022-07-06 DIAGNOSIS — N2581 Secondary hyperparathyroidism of renal origin: Secondary | ICD-10-CM | POA: Diagnosis not present

## 2022-07-06 DIAGNOSIS — E1122 Type 2 diabetes mellitus with diabetic chronic kidney disease: Secondary | ICD-10-CM | POA: Diagnosis not present

## 2022-07-06 DIAGNOSIS — N186 End stage renal disease: Secondary | ICD-10-CM | POA: Diagnosis not present

## 2022-07-06 DIAGNOSIS — Z992 Dependence on renal dialysis: Secondary | ICD-10-CM | POA: Diagnosis not present

## 2022-07-06 DIAGNOSIS — D689 Coagulation defect, unspecified: Secondary | ICD-10-CM | POA: Diagnosis not present

## 2022-07-09 DIAGNOSIS — D509 Iron deficiency anemia, unspecified: Secondary | ICD-10-CM | POA: Diagnosis not present

## 2022-07-09 DIAGNOSIS — N186 End stage renal disease: Secondary | ICD-10-CM | POA: Diagnosis not present

## 2022-07-09 DIAGNOSIS — N2581 Secondary hyperparathyroidism of renal origin: Secondary | ICD-10-CM | POA: Diagnosis not present

## 2022-07-09 DIAGNOSIS — D631 Anemia in chronic kidney disease: Secondary | ICD-10-CM | POA: Diagnosis not present

## 2022-07-09 DIAGNOSIS — D689 Coagulation defect, unspecified: Secondary | ICD-10-CM | POA: Diagnosis not present

## 2022-07-09 DIAGNOSIS — E875 Hyperkalemia: Secondary | ICD-10-CM | POA: Diagnosis not present

## 2022-07-09 DIAGNOSIS — E1122 Type 2 diabetes mellitus with diabetic chronic kidney disease: Secondary | ICD-10-CM | POA: Diagnosis not present

## 2022-07-09 DIAGNOSIS — Z992 Dependence on renal dialysis: Secondary | ICD-10-CM | POA: Diagnosis not present

## 2022-07-11 ENCOUNTER — Ambulatory Visit: Payer: Medicare Other | Attending: Internal Medicine

## 2022-07-11 DIAGNOSIS — N2581 Secondary hyperparathyroidism of renal origin: Secondary | ICD-10-CM | POA: Diagnosis not present

## 2022-07-11 DIAGNOSIS — D631 Anemia in chronic kidney disease: Secondary | ICD-10-CM | POA: Diagnosis not present

## 2022-07-11 DIAGNOSIS — E875 Hyperkalemia: Secondary | ICD-10-CM | POA: Diagnosis not present

## 2022-07-11 DIAGNOSIS — I255 Ischemic cardiomyopathy: Secondary | ICD-10-CM | POA: Diagnosis not present

## 2022-07-11 DIAGNOSIS — N186 End stage renal disease: Secondary | ICD-10-CM | POA: Diagnosis not present

## 2022-07-11 DIAGNOSIS — Z992 Dependence on renal dialysis: Secondary | ICD-10-CM | POA: Diagnosis not present

## 2022-07-11 DIAGNOSIS — E1122 Type 2 diabetes mellitus with diabetic chronic kidney disease: Secondary | ICD-10-CM | POA: Diagnosis not present

## 2022-07-11 DIAGNOSIS — D509 Iron deficiency anemia, unspecified: Secondary | ICD-10-CM | POA: Diagnosis not present

## 2022-07-11 DIAGNOSIS — D689 Coagulation defect, unspecified: Secondary | ICD-10-CM | POA: Diagnosis not present

## 2022-07-11 LAB — CUP PACEART REMOTE DEVICE CHECK
Battery Remaining Longevity: 66 mo
Battery Voltage: 2.98 V
Brady Statistic RV Percent Paced: 0.01 %
Date Time Interrogation Session: 20230914001706
HighPow Impedance: 254 Ohm
HighPow Impedance: 57 Ohm
Implantable Lead Implant Date: 20210315
Implantable Lead Location: 753860
Implantable Lead Model: 6935
Implantable Pulse Generator Implant Date: 20190208
Lead Channel Impedance Value: 304 Ohm
Lead Channel Impedance Value: 418 Ohm
Lead Channel Pacing Threshold Amplitude: 0.5 V
Lead Channel Pacing Threshold Pulse Width: 0.4 ms
Lead Channel Sensing Intrinsic Amplitude: 14.5 mV
Lead Channel Sensing Intrinsic Amplitude: 14.5 mV
Lead Channel Setting Pacing Amplitude: 2 V
Lead Channel Setting Pacing Pulse Width: 0.4 ms
Lead Channel Setting Sensing Sensitivity: 0.3 mV

## 2022-07-13 DIAGNOSIS — E1122 Type 2 diabetes mellitus with diabetic chronic kidney disease: Secondary | ICD-10-CM | POA: Diagnosis not present

## 2022-07-13 DIAGNOSIS — N2581 Secondary hyperparathyroidism of renal origin: Secondary | ICD-10-CM | POA: Diagnosis not present

## 2022-07-13 DIAGNOSIS — D509 Iron deficiency anemia, unspecified: Secondary | ICD-10-CM | POA: Diagnosis not present

## 2022-07-13 DIAGNOSIS — Z992 Dependence on renal dialysis: Secondary | ICD-10-CM | POA: Diagnosis not present

## 2022-07-13 DIAGNOSIS — E875 Hyperkalemia: Secondary | ICD-10-CM | POA: Diagnosis not present

## 2022-07-13 DIAGNOSIS — N186 End stage renal disease: Secondary | ICD-10-CM | POA: Diagnosis not present

## 2022-07-13 DIAGNOSIS — D689 Coagulation defect, unspecified: Secondary | ICD-10-CM | POA: Diagnosis not present

## 2022-07-13 DIAGNOSIS — D631 Anemia in chronic kidney disease: Secondary | ICD-10-CM | POA: Diagnosis not present

## 2022-07-16 DIAGNOSIS — D631 Anemia in chronic kidney disease: Secondary | ICD-10-CM | POA: Diagnosis not present

## 2022-07-16 DIAGNOSIS — D689 Coagulation defect, unspecified: Secondary | ICD-10-CM | POA: Diagnosis not present

## 2022-07-16 DIAGNOSIS — Z992 Dependence on renal dialysis: Secondary | ICD-10-CM | POA: Diagnosis not present

## 2022-07-16 DIAGNOSIS — N186 End stage renal disease: Secondary | ICD-10-CM | POA: Diagnosis not present

## 2022-07-16 DIAGNOSIS — E1122 Type 2 diabetes mellitus with diabetic chronic kidney disease: Secondary | ICD-10-CM | POA: Diagnosis not present

## 2022-07-16 DIAGNOSIS — D509 Iron deficiency anemia, unspecified: Secondary | ICD-10-CM | POA: Diagnosis not present

## 2022-07-16 DIAGNOSIS — E875 Hyperkalemia: Secondary | ICD-10-CM | POA: Diagnosis not present

## 2022-07-16 DIAGNOSIS — N2581 Secondary hyperparathyroidism of renal origin: Secondary | ICD-10-CM | POA: Diagnosis not present

## 2022-07-18 DIAGNOSIS — E1122 Type 2 diabetes mellitus with diabetic chronic kidney disease: Secondary | ICD-10-CM | POA: Diagnosis not present

## 2022-07-18 DIAGNOSIS — N2581 Secondary hyperparathyroidism of renal origin: Secondary | ICD-10-CM | POA: Diagnosis not present

## 2022-07-18 DIAGNOSIS — D509 Iron deficiency anemia, unspecified: Secondary | ICD-10-CM | POA: Diagnosis not present

## 2022-07-18 DIAGNOSIS — E875 Hyperkalemia: Secondary | ICD-10-CM | POA: Diagnosis not present

## 2022-07-18 DIAGNOSIS — Z992 Dependence on renal dialysis: Secondary | ICD-10-CM | POA: Diagnosis not present

## 2022-07-18 DIAGNOSIS — D631 Anemia in chronic kidney disease: Secondary | ICD-10-CM | POA: Diagnosis not present

## 2022-07-18 DIAGNOSIS — N186 End stage renal disease: Secondary | ICD-10-CM | POA: Diagnosis not present

## 2022-07-18 DIAGNOSIS — D689 Coagulation defect, unspecified: Secondary | ICD-10-CM | POA: Diagnosis not present

## 2022-07-20 DIAGNOSIS — D509 Iron deficiency anemia, unspecified: Secondary | ICD-10-CM | POA: Diagnosis not present

## 2022-07-20 DIAGNOSIS — E875 Hyperkalemia: Secondary | ICD-10-CM | POA: Diagnosis not present

## 2022-07-20 DIAGNOSIS — D689 Coagulation defect, unspecified: Secondary | ICD-10-CM | POA: Diagnosis not present

## 2022-07-20 DIAGNOSIS — Z992 Dependence on renal dialysis: Secondary | ICD-10-CM | POA: Diagnosis not present

## 2022-07-20 DIAGNOSIS — N2581 Secondary hyperparathyroidism of renal origin: Secondary | ICD-10-CM | POA: Diagnosis not present

## 2022-07-20 DIAGNOSIS — D631 Anemia in chronic kidney disease: Secondary | ICD-10-CM | POA: Diagnosis not present

## 2022-07-20 DIAGNOSIS — N186 End stage renal disease: Secondary | ICD-10-CM | POA: Diagnosis not present

## 2022-07-20 DIAGNOSIS — E1122 Type 2 diabetes mellitus with diabetic chronic kidney disease: Secondary | ICD-10-CM | POA: Diagnosis not present

## 2022-07-23 DIAGNOSIS — Z992 Dependence on renal dialysis: Secondary | ICD-10-CM | POA: Diagnosis not present

## 2022-07-23 DIAGNOSIS — E1122 Type 2 diabetes mellitus with diabetic chronic kidney disease: Secondary | ICD-10-CM | POA: Diagnosis not present

## 2022-07-23 DIAGNOSIS — E875 Hyperkalemia: Secondary | ICD-10-CM | POA: Diagnosis not present

## 2022-07-23 DIAGNOSIS — N2581 Secondary hyperparathyroidism of renal origin: Secondary | ICD-10-CM | POA: Diagnosis not present

## 2022-07-23 DIAGNOSIS — N186 End stage renal disease: Secondary | ICD-10-CM | POA: Diagnosis not present

## 2022-07-23 DIAGNOSIS — D689 Coagulation defect, unspecified: Secondary | ICD-10-CM | POA: Diagnosis not present

## 2022-07-23 DIAGNOSIS — D631 Anemia in chronic kidney disease: Secondary | ICD-10-CM | POA: Diagnosis not present

## 2022-07-23 DIAGNOSIS — D509 Iron deficiency anemia, unspecified: Secondary | ICD-10-CM | POA: Diagnosis not present

## 2022-07-24 NOTE — Progress Notes (Signed)
Electrophysiology Office Note Date: 07/26/2022  ID:  James Nicholson, James Nicholson 1966-10-22, MRN [id number]  PCP: [name], MD Primary Cardiologist: None Electrophysiologist: Cristopher Peru, MD   CC: Routine ICD follow-up  [facility] James Nicholson is a 56 y.o. male seen today for Cristopher Peru, MD for routine electrophysiology followup. Since last being seen in our clinic the patient reports doing well from a cardiac perspective. he denies chest pain, palpitations, dyspnea, PND, orthopnea, nausea, vomiting, dizziness, syncope, edema, weight gain, or early satiety.  BP low on arrival. Specifically denies lightheadedness and has had no inability to complete HD.    He has not had ICD shocks.   Device History: Medtronic Single Chamber ICD implanted 2010, gen change 2019, lead veision 12/2019 for VF  Past Medical History:  Diagnosis Date   AICD (automatic cardioverter/defibrillator) present    Medtronic Visia AF MRIT VR [id number]   Anemia    Atrial fibrillation    Cardiomyopathy ([place]) 2010   Unclear Etiology: Last Echo 06/2009: EF 40-45%, severe Lateral & apical Hypokinesis (? CAD) ; Grade 2 DDysfxn. Mild conc LVH.    Cellulitis    Chronic kidney disease (CKD), stage V ([place])    Dialysis Tue, Thurs, Sat   Confusion    Depression    no meds   Diabetes mellitus without complication (HCC)    type 2, diet controlled, no meds, pt. denies   Dyslipidemia    Enterobacter sepsis ([name])    HLD (hyperlipidemia)    Hypertension    Pneumonia    S/P ICD (internal cardiac defibrillator) procedure 2010   VT Arrest (in [place])   Schizophrenia [facility])    no meds   Wegener's disease, pulmonary 02/05/2013   Wegener's granulomatosis    Past Surgical History:  Procedure Laterality Date   A/V FISTULAGRAM Right 03/17/2020   Procedure: A/V FISTULAGRAM;  Surgeon: Angelia Mould, MD;  Location: [place] CV LAB;  Service: Cardiovascular;  Laterality: Right;   AV FISTULA PLACEMENT Right 02/10/2013    Procedure: ARTERIOVENOUS (AV) FISTULA CREATION;  Surgeon: Rosetta Posner, MD;  Location[facility] OR;  Service: Vascular;  Laterality: Right;  Right forearm radial/cephalic arterovenous fistula.    CARDIAC CATHETERIZATION  2010   [place]: In setting of VT arrest. Per brother's report, nonobstructive with no intervention   CARDIAC DEFIBRILLATOR PLACEMENT  2010   [place]   EYE SURGERY Right    FISTULOGRAM Right 08/09/2013   Procedure: FISTULOGRAM;  Surgeon: Angelia Mould, MD;  Location: [facility] CATH LAB;  Service: Cardiovascular;  Laterality: Right;   ICD GENERATOR CHANGEOUT N/A 12/05/2017   Procedure: [place];  Surgeon: Evans Lance, MD;  Location: [place] CV LAB;  Service: Cardiovascular;  Laterality: N/A;   ICD LEAD REMOVAL N/A 01/10/2020   Procedure: ICD LEAD REMOVAL AND IMPLANTATION OF NEW LEAD IN RIGHT VENTRICLE;  Surgeon: Evans Lance, MD;  Location: [place];  Service: Cardiovascular;  Laterality: N/A;   INSERTION OF DIALYSIS CATHETER Right 08/07/2020   Procedure: INSERTION OF DIALYSIS CATHETER;  Surgeon: Angelia Mould, MD;  Location: [place];  Service: Vascular;  Laterality: Right;   LIGATION OF ARTERIOVENOUS  FISTULA Right 3/82/5053   Procedure: PLICATION OF ARTERIOVENOUS  FISTULA RIGHT ARM;  Surgeon: Angelia Mould, MD;  Location: [facility] OR;  Service: Vascular;  Laterality: Right;   LIGATION OF COMPETING BRANCHES OF ARTERIOVENOUS FISTULA Right 08/13/2013   Procedure: LIGATION OF COMPETING BRANCHES X5 OF ARTERIOVENOUS FISTULA- RIGHT ARM;  Surgeon: Serafina Mitchell, MD;  Location: [facility] OR;  Service: Vascular;  Laterality: Right;   REVISON OF ARTERIOVENOUS FISTULA Right 19/62/2297   Procedure: PLICATION OF ARTERIOVENOUS FISTULA RIGHT;  Surgeon: Angelia Mould, MD;  Location: [place];  Service: Vascular;  Laterality: Right;   TEE WITHOUT CARDIOVERSION N/A 01/10/2020   Procedure: TRANSESOPHAGEAL ECHOCARDIOGRAM (TEE);  Surgeon: Evans Lance, MD;  Location: [facility]  OR;  Service: Cardiovascular;  Laterality: N/A;    Current Outpatient Medications  Medication Sig Dispense Refill   atorvastatin (LIPITOR) 20 MG tablet Take 1 tablet by mouth once daily 90 tablet 1   AURYXIA 1 GM 210 MG(Fe) tablet Take 210 mg by mouth 3 (three) times daily.      metoprolol succinate (TOPROL-XL) 50 MG 24 hr tablet Take 1 tablet by mouth once daily 30 tablet 0   No current facility-administered medications for this visit.    Allergies:   Patient has no known allergies.   Social History: Social History   Socioeconomic History   Marital status: Single    Spouse name: Not on file   Number of children: Not on file   Years of education: Not on file   Highest education level: Not on file  Occupational History   Not on file  Tobacco Use   Smoking status: Former    Types: Cigarettes    Quit date: 10/28/2008    Years since quitting: 13.7   Smokeless tobacco: Never  Vaping Use   Vaping Use: Never used  Substance and Sexual Activity   Alcohol use: No   Drug use: No   Sexual activity: Not on file  Other Topics Concern   Not on file  Social History Narrative   Not on file   Social Determinants of Health   Financial Resource Strain: Not on file  Food Insecurity: Not on file  Transportation Needs: Not on file  Physical Activity: Not on file  Stress: Not on file  Social Connections: Not on file  Intimate Partner Violence: Not on file    Family History: Family History  Problem Relation Age of Onset   CAD Mother     Review of Systems: All other systems reviewed and are otherwise negative except as noted above.   Physical Exam: Vitals:   07/26/22 0901  BP: (!) 88/70  Pulse: 88  Weight: 157 lb (71.2 kg)  Height: '5\' 9"'$  (1.753 m)     GEN- The patient is well appearing, alert and oriented x 3 today.   HEENT: normocephalic, atraumatic; sclera clear, conjunctiva pink; hearing intact; oropharynx clear; neck supple, no JVP Lymph- no cervical  lymphadenopathy Lungs- Clear to ausculation bilaterally, normal work of breathing.  No wheezes, rales, rhonchi Heart- Regular  rate and rhythm, no murmurs, rubs or gallops, PMI not laterally displaced GI- soft, non-tender, non-distended, bowel sounds present, no hepatosplenomegaly Extremities- no clubbing or cyanosis. Trace peripheral edema; DP/PT/radial pulses 2+ bilaterally MS- no significant deformity or atrophy Skin- warm and dry, no rash or lesion; ICD pocket well healed Psych- euthymic mood, full affect Neuro- strength and sensation are intact  ICD interrogation- reviewed in detail today,  See PACEART report  EKG:  EKG is not ordered today.  Recent Labs: No results found for requested labs within last 365 days.   Wt Readings from Last 3 Encounters:  07/26/22 157 lb (71.2 kg)  06/01/21 150 lb (68 kg)  09/06/20 156 lb 14.4 oz (71.2 kg)     Other studies Reviewed: Additional studies/ records that were reviewed today include:  Previous EP office notes.   Assessment and Plan:  1.  Chronic systolic dysfunction s/p Medtronic single chamber ICD  euvolemic today Stable on an appropriate medical regimen Normal ICD function See Pace Art report No changes today Echo 12/2019 LVEF 30*-35% GDMT limited by ESRD.  2. HTN Stable on current regimen  Continue toprol for now, can down-titrate vs add midodrine if needed.   3. ESRD on HD Stable  Current medicines are reviewed at length with the patient today.    Disposition:   Follow up with Dr. Lovena Le in 12 months    Signed, Shirley Friar, PA-C  07/26/2022 9:08 AM  [facility] [address] [place] [address] [phone] (office) [phone] (fax)

## 2022-07-24 NOTE — Progress Notes (Signed)
Remote ICD transmission.   

## 2022-07-25 ENCOUNTER — Other Ambulatory Visit: Payer: Self-pay | Admitting: Internal Medicine

## 2022-07-25 DIAGNOSIS — N186 End stage renal disease: Secondary | ICD-10-CM | POA: Diagnosis not present

## 2022-07-25 DIAGNOSIS — D631 Anemia in chronic kidney disease: Secondary | ICD-10-CM | POA: Diagnosis not present

## 2022-07-25 DIAGNOSIS — N2581 Secondary hyperparathyroidism of renal origin: Secondary | ICD-10-CM | POA: Diagnosis not present

## 2022-07-25 DIAGNOSIS — E1122 Type 2 diabetes mellitus with diabetic chronic kidney disease: Secondary | ICD-10-CM | POA: Diagnosis not present

## 2022-07-25 DIAGNOSIS — D689 Coagulation defect, unspecified: Secondary | ICD-10-CM | POA: Diagnosis not present

## 2022-07-25 DIAGNOSIS — D509 Iron deficiency anemia, unspecified: Secondary | ICD-10-CM | POA: Diagnosis not present

## 2022-07-25 DIAGNOSIS — E875 Hyperkalemia: Secondary | ICD-10-CM | POA: Diagnosis not present

## 2022-07-25 DIAGNOSIS — Z992 Dependence on renal dialysis: Secondary | ICD-10-CM | POA: Diagnosis not present

## 2022-07-26 ENCOUNTER — Ambulatory Visit: Payer: Medicare Other | Attending: Student | Admitting: Student

## 2022-07-26 ENCOUNTER — Encounter: Payer: Self-pay | Admitting: Student

## 2022-07-26 VITALS — BP 88/70 | HR 88 | Ht 69.0 in | Wt 157.0 lb

## 2022-07-26 DIAGNOSIS — I255 Ischemic cardiomyopathy: Secondary | ICD-10-CM | POA: Diagnosis not present

## 2022-07-26 DIAGNOSIS — I10 Essential (primary) hypertension: Secondary | ICD-10-CM

## 2022-07-26 DIAGNOSIS — I5022 Chronic systolic (congestive) heart failure: Secondary | ICD-10-CM

## 2022-07-26 LAB — CUP PACEART INCLINIC DEVICE CHECK
Battery Remaining Longevity: 65 mo
Battery Voltage: 2.98 V
Brady Statistic RV Percent Paced: 0.01 %
Date Time Interrogation Session: 20230929092534
HighPow Impedance: 254 Ohm
HighPow Impedance: 67 Ohm
Implantable Lead Implant Date: 20210315
Implantable Lead Location: 753860
Implantable Lead Model: 6935
Implantable Pulse Generator Implant Date: 20190208
Lead Channel Impedance Value: 361 Ohm
Lead Channel Impedance Value: 475 Ohm
Lead Channel Pacing Threshold Amplitude: 0.5 V
Lead Channel Pacing Threshold Pulse Width: 0.4 ms
Lead Channel Sensing Intrinsic Amplitude: 14.875 mV
Lead Channel Sensing Intrinsic Amplitude: 16.125 mV
Lead Channel Setting Pacing Amplitude: 2 V
Lead Channel Setting Pacing Pulse Width: 0.4 ms
Lead Channel Setting Sensing Sensitivity: 0.3 mV

## 2022-07-26 NOTE — Patient Instructions (Signed)
Medication Instructions:  Your physician recommends that you continue on your current medications as directed. Please refer to the Current Medication list given to you today.  *If you need a refill on your cardiac medications before your next appointment, please call your pharmacy*   Lab Work: None  If you have labs (blood work) drawn today and your tests are completely normal, you will receive your results only by: MyChart Message (if you have MyChart) OR A paper copy in the mail If you have any lab test that is abnormal or we need to change your treatment, we will call you to review the results.   Follow-Up: At [facility], you and your health needs are our priority.  As part of our continuing mission to provide you with exceptional heart care, we have created designated Provider Care Teams.  These Care Teams include your primary Cardiologist (physician) and Advanced Practice Providers (APPs -  Physician Assistants and Nurse Practitioners) who all work together to provide you with the care you need, when you need it.  We recommend signing up for the patient portal called "MyChart".  Sign up information is provided on this After Visit Summary.  MyChart is used to connect with patients for Virtual Visits (Telemedicine).  Patients are able to view lab/test results, encounter notes, upcoming appointments, etc.  Non-urgent messages can be sent to your provider as well.   To learn more about what you can do with MyChart, go to [email or web address].    Your next appointment:   1 year(s)  The format for your next appointment:   In Person  Provider:   Gregg Taylor, MD     Important Information About Sugar       

## 2022-07-27 DIAGNOSIS — D631 Anemia in chronic kidney disease: Secondary | ICD-10-CM | POA: Diagnosis not present

## 2022-07-27 DIAGNOSIS — N186 End stage renal disease: Secondary | ICD-10-CM | POA: Diagnosis not present

## 2022-07-27 DIAGNOSIS — E875 Hyperkalemia: Secondary | ICD-10-CM | POA: Diagnosis not present

## 2022-07-27 DIAGNOSIS — D689 Coagulation defect, unspecified: Secondary | ICD-10-CM | POA: Diagnosis not present

## 2022-07-27 DIAGNOSIS — N032 Chronic nephritic syndrome with diffuse membranous glomerulonephritis: Secondary | ICD-10-CM | POA: Diagnosis not present

## 2022-07-27 DIAGNOSIS — Z992 Dependence on renal dialysis: Secondary | ICD-10-CM | POA: Diagnosis not present

## 2022-07-27 DIAGNOSIS — E1122 Type 2 diabetes mellitus with diabetic chronic kidney disease: Secondary | ICD-10-CM | POA: Diagnosis not present

## 2022-07-27 DIAGNOSIS — D509 Iron deficiency anemia, unspecified: Secondary | ICD-10-CM | POA: Diagnosis not present

## 2022-07-27 DIAGNOSIS — N2581 Secondary hyperparathyroidism of renal origin: Secondary | ICD-10-CM | POA: Diagnosis not present

## 2022-07-30 DIAGNOSIS — E1122 Type 2 diabetes mellitus with diabetic chronic kidney disease: Secondary | ICD-10-CM | POA: Diagnosis not present

## 2022-07-30 DIAGNOSIS — N186 End stage renal disease: Secondary | ICD-10-CM | POA: Diagnosis not present

## 2022-07-30 DIAGNOSIS — D509 Iron deficiency anemia, unspecified: Secondary | ICD-10-CM | POA: Diagnosis not present

## 2022-07-30 DIAGNOSIS — Z992 Dependence on renal dialysis: Secondary | ICD-10-CM | POA: Diagnosis not present

## 2022-07-30 DIAGNOSIS — N2581 Secondary hyperparathyroidism of renal origin: Secondary | ICD-10-CM | POA: Diagnosis not present

## 2022-07-30 DIAGNOSIS — D631 Anemia in chronic kidney disease: Secondary | ICD-10-CM | POA: Diagnosis not present

## 2022-07-30 DIAGNOSIS — D689 Coagulation defect, unspecified: Secondary | ICD-10-CM | POA: Diagnosis not present

## 2022-07-30 DIAGNOSIS — E875 Hyperkalemia: Secondary | ICD-10-CM | POA: Diagnosis not present

## 2022-07-30 DIAGNOSIS — Z23 Encounter for immunization: Secondary | ICD-10-CM | POA: Diagnosis not present

## 2022-07-31 DIAGNOSIS — N186 End stage renal disease: Secondary | ICD-10-CM | POA: Diagnosis not present

## 2022-08-01 DIAGNOSIS — E1122 Type 2 diabetes mellitus with diabetic chronic kidney disease: Secondary | ICD-10-CM | POA: Diagnosis not present

## 2022-08-01 DIAGNOSIS — N2581 Secondary hyperparathyroidism of renal origin: Secondary | ICD-10-CM | POA: Diagnosis not present

## 2022-08-01 DIAGNOSIS — Z23 Encounter for immunization: Secondary | ICD-10-CM | POA: Diagnosis not present

## 2022-08-01 DIAGNOSIS — E875 Hyperkalemia: Secondary | ICD-10-CM | POA: Diagnosis not present

## 2022-08-01 DIAGNOSIS — D631 Anemia in chronic kidney disease: Secondary | ICD-10-CM | POA: Diagnosis not present

## 2022-08-01 DIAGNOSIS — D689 Coagulation defect, unspecified: Secondary | ICD-10-CM | POA: Diagnosis not present

## 2022-08-01 DIAGNOSIS — N186 End stage renal disease: Secondary | ICD-10-CM | POA: Diagnosis not present

## 2022-08-01 DIAGNOSIS — D509 Iron deficiency anemia, unspecified: Secondary | ICD-10-CM | POA: Diagnosis not present

## 2022-08-01 DIAGNOSIS — Z992 Dependence on renal dialysis: Secondary | ICD-10-CM | POA: Diagnosis not present

## 2022-08-03 DIAGNOSIS — D689 Coagulation defect, unspecified: Secondary | ICD-10-CM | POA: Diagnosis not present

## 2022-08-03 DIAGNOSIS — Z23 Encounter for immunization: Secondary | ICD-10-CM | POA: Diagnosis not present

## 2022-08-03 DIAGNOSIS — E1122 Type 2 diabetes mellitus with diabetic chronic kidney disease: Secondary | ICD-10-CM | POA: Diagnosis not present

## 2022-08-03 DIAGNOSIS — N186 End stage renal disease: Secondary | ICD-10-CM | POA: Diagnosis not present

## 2022-08-03 DIAGNOSIS — D509 Iron deficiency anemia, unspecified: Secondary | ICD-10-CM | POA: Diagnosis not present

## 2022-08-03 DIAGNOSIS — D631 Anemia in chronic kidney disease: Secondary | ICD-10-CM | POA: Diagnosis not present

## 2022-08-03 DIAGNOSIS — N2581 Secondary hyperparathyroidism of renal origin: Secondary | ICD-10-CM | POA: Diagnosis not present

## 2022-08-03 DIAGNOSIS — E875 Hyperkalemia: Secondary | ICD-10-CM | POA: Diagnosis not present

## 2022-08-03 DIAGNOSIS — Z992 Dependence on renal dialysis: Secondary | ICD-10-CM | POA: Diagnosis not present

## 2022-08-06 DIAGNOSIS — Z992 Dependence on renal dialysis: Secondary | ICD-10-CM | POA: Diagnosis not present

## 2022-08-06 DIAGNOSIS — N2581 Secondary hyperparathyroidism of renal origin: Secondary | ICD-10-CM | POA: Diagnosis not present

## 2022-08-06 DIAGNOSIS — D631 Anemia in chronic kidney disease: Secondary | ICD-10-CM | POA: Diagnosis not present

## 2022-08-06 DIAGNOSIS — E1122 Type 2 diabetes mellitus with diabetic chronic kidney disease: Secondary | ICD-10-CM | POA: Diagnosis not present

## 2022-08-06 DIAGNOSIS — D689 Coagulation defect, unspecified: Secondary | ICD-10-CM | POA: Diagnosis not present

## 2022-08-06 DIAGNOSIS — N186 End stage renal disease: Secondary | ICD-10-CM | POA: Diagnosis not present

## 2022-08-06 DIAGNOSIS — D509 Iron deficiency anemia, unspecified: Secondary | ICD-10-CM | POA: Diagnosis not present

## 2022-08-06 DIAGNOSIS — E875 Hyperkalemia: Secondary | ICD-10-CM | POA: Diagnosis not present

## 2022-08-06 DIAGNOSIS — Z23 Encounter for immunization: Secondary | ICD-10-CM | POA: Diagnosis not present

## 2022-08-07 ENCOUNTER — Other Ambulatory Visit: Payer: Self-pay | Admitting: Internal Medicine

## 2022-08-08 DIAGNOSIS — D689 Coagulation defect, unspecified: Secondary | ICD-10-CM | POA: Diagnosis not present

## 2022-08-08 DIAGNOSIS — N186 End stage renal disease: Secondary | ICD-10-CM | POA: Diagnosis not present

## 2022-08-08 DIAGNOSIS — D631 Anemia in chronic kidney disease: Secondary | ICD-10-CM | POA: Diagnosis not present

## 2022-08-08 DIAGNOSIS — E1122 Type 2 diabetes mellitus with diabetic chronic kidney disease: Secondary | ICD-10-CM | POA: Diagnosis not present

## 2022-08-08 DIAGNOSIS — E875 Hyperkalemia: Secondary | ICD-10-CM | POA: Diagnosis not present

## 2022-08-08 DIAGNOSIS — Z992 Dependence on renal dialysis: Secondary | ICD-10-CM | POA: Diagnosis not present

## 2022-08-08 DIAGNOSIS — D509 Iron deficiency anemia, unspecified: Secondary | ICD-10-CM | POA: Diagnosis not present

## 2022-08-08 DIAGNOSIS — N2581 Secondary hyperparathyroidism of renal origin: Secondary | ICD-10-CM | POA: Diagnosis not present

## 2022-08-08 DIAGNOSIS — Z23 Encounter for immunization: Secondary | ICD-10-CM | POA: Diagnosis not present

## 2022-08-10 DIAGNOSIS — D509 Iron deficiency anemia, unspecified: Secondary | ICD-10-CM | POA: Diagnosis not present

## 2022-08-10 DIAGNOSIS — E875 Hyperkalemia: Secondary | ICD-10-CM | POA: Diagnosis not present

## 2022-08-10 DIAGNOSIS — D689 Coagulation defect, unspecified: Secondary | ICD-10-CM | POA: Diagnosis not present

## 2022-08-10 DIAGNOSIS — E1122 Type 2 diabetes mellitus with diabetic chronic kidney disease: Secondary | ICD-10-CM | POA: Diagnosis not present

## 2022-08-10 DIAGNOSIS — Z23 Encounter for immunization: Secondary | ICD-10-CM | POA: Diagnosis not present

## 2022-08-10 DIAGNOSIS — N186 End stage renal disease: Secondary | ICD-10-CM | POA: Diagnosis not present

## 2022-08-10 DIAGNOSIS — N2581 Secondary hyperparathyroidism of renal origin: Secondary | ICD-10-CM | POA: Diagnosis not present

## 2022-08-10 DIAGNOSIS — Z992 Dependence on renal dialysis: Secondary | ICD-10-CM | POA: Diagnosis not present

## 2022-08-10 DIAGNOSIS — D631 Anemia in chronic kidney disease: Secondary | ICD-10-CM | POA: Diagnosis not present

## 2022-08-13 DIAGNOSIS — E1122 Type 2 diabetes mellitus with diabetic chronic kidney disease: Secondary | ICD-10-CM | POA: Diagnosis not present

## 2022-08-13 DIAGNOSIS — D631 Anemia in chronic kidney disease: Secondary | ICD-10-CM | POA: Diagnosis not present

## 2022-08-13 DIAGNOSIS — D509 Iron deficiency anemia, unspecified: Secondary | ICD-10-CM | POA: Diagnosis not present

## 2022-08-13 DIAGNOSIS — E875 Hyperkalemia: Secondary | ICD-10-CM | POA: Diagnosis not present

## 2022-08-13 DIAGNOSIS — Z23 Encounter for immunization: Secondary | ICD-10-CM | POA: Diagnosis not present

## 2022-08-13 DIAGNOSIS — N2581 Secondary hyperparathyroidism of renal origin: Secondary | ICD-10-CM | POA: Diagnosis not present

## 2022-08-13 DIAGNOSIS — D689 Coagulation defect, unspecified: Secondary | ICD-10-CM | POA: Diagnosis not present

## 2022-08-13 DIAGNOSIS — Z992 Dependence on renal dialysis: Secondary | ICD-10-CM | POA: Diagnosis not present

## 2022-08-13 DIAGNOSIS — N186 End stage renal disease: Secondary | ICD-10-CM | POA: Diagnosis not present

## 2022-08-15 DIAGNOSIS — D509 Iron deficiency anemia, unspecified: Secondary | ICD-10-CM | POA: Diagnosis not present

## 2022-08-15 DIAGNOSIS — D689 Coagulation defect, unspecified: Secondary | ICD-10-CM | POA: Diagnosis not present

## 2022-08-15 DIAGNOSIS — D631 Anemia in chronic kidney disease: Secondary | ICD-10-CM | POA: Diagnosis not present

## 2022-08-15 DIAGNOSIS — E1122 Type 2 diabetes mellitus with diabetic chronic kidney disease: Secondary | ICD-10-CM | POA: Diagnosis not present

## 2022-08-15 DIAGNOSIS — E875 Hyperkalemia: Secondary | ICD-10-CM | POA: Diagnosis not present

## 2022-08-15 DIAGNOSIS — N2581 Secondary hyperparathyroidism of renal origin: Secondary | ICD-10-CM | POA: Diagnosis not present

## 2022-08-15 DIAGNOSIS — Z23 Encounter for immunization: Secondary | ICD-10-CM | POA: Diagnosis not present

## 2022-08-15 DIAGNOSIS — N186 End stage renal disease: Secondary | ICD-10-CM | POA: Diagnosis not present

## 2022-08-15 DIAGNOSIS — Z992 Dependence on renal dialysis: Secondary | ICD-10-CM | POA: Diagnosis not present

## 2022-08-17 DIAGNOSIS — D689 Coagulation defect, unspecified: Secondary | ICD-10-CM | POA: Diagnosis not present

## 2022-08-17 DIAGNOSIS — D509 Iron deficiency anemia, unspecified: Secondary | ICD-10-CM | POA: Diagnosis not present

## 2022-08-17 DIAGNOSIS — Z992 Dependence on renal dialysis: Secondary | ICD-10-CM | POA: Diagnosis not present

## 2022-08-17 DIAGNOSIS — E1122 Type 2 diabetes mellitus with diabetic chronic kidney disease: Secondary | ICD-10-CM | POA: Diagnosis not present

## 2022-08-17 DIAGNOSIS — N186 End stage renal disease: Secondary | ICD-10-CM | POA: Diagnosis not present

## 2022-08-17 DIAGNOSIS — E875 Hyperkalemia: Secondary | ICD-10-CM | POA: Diagnosis not present

## 2022-08-17 DIAGNOSIS — Z23 Encounter for immunization: Secondary | ICD-10-CM | POA: Diagnosis not present

## 2022-08-17 DIAGNOSIS — N2581 Secondary hyperparathyroidism of renal origin: Secondary | ICD-10-CM | POA: Diagnosis not present

## 2022-08-17 DIAGNOSIS — D631 Anemia in chronic kidney disease: Secondary | ICD-10-CM | POA: Diagnosis not present

## 2022-08-20 DIAGNOSIS — E875 Hyperkalemia: Secondary | ICD-10-CM | POA: Diagnosis not present

## 2022-08-20 DIAGNOSIS — D509 Iron deficiency anemia, unspecified: Secondary | ICD-10-CM | POA: Diagnosis not present

## 2022-08-20 DIAGNOSIS — E1122 Type 2 diabetes mellitus with diabetic chronic kidney disease: Secondary | ICD-10-CM | POA: Diagnosis not present

## 2022-08-20 DIAGNOSIS — Z992 Dependence on renal dialysis: Secondary | ICD-10-CM | POA: Diagnosis not present

## 2022-08-20 DIAGNOSIS — N2581 Secondary hyperparathyroidism of renal origin: Secondary | ICD-10-CM | POA: Diagnosis not present

## 2022-08-20 DIAGNOSIS — D689 Coagulation defect, unspecified: Secondary | ICD-10-CM | POA: Diagnosis not present

## 2022-08-20 DIAGNOSIS — Z23 Encounter for immunization: Secondary | ICD-10-CM | POA: Diagnosis not present

## 2022-08-20 DIAGNOSIS — N186 End stage renal disease: Secondary | ICD-10-CM | POA: Diagnosis not present

## 2022-08-20 DIAGNOSIS — D631 Anemia in chronic kidney disease: Secondary | ICD-10-CM | POA: Diagnosis not present

## 2022-08-22 DIAGNOSIS — Z23 Encounter for immunization: Secondary | ICD-10-CM | POA: Diagnosis not present

## 2022-08-22 DIAGNOSIS — D689 Coagulation defect, unspecified: Secondary | ICD-10-CM | POA: Diagnosis not present

## 2022-08-22 DIAGNOSIS — D631 Anemia in chronic kidney disease: Secondary | ICD-10-CM | POA: Diagnosis not present

## 2022-08-22 DIAGNOSIS — D509 Iron deficiency anemia, unspecified: Secondary | ICD-10-CM | POA: Diagnosis not present

## 2022-08-22 DIAGNOSIS — N2581 Secondary hyperparathyroidism of renal origin: Secondary | ICD-10-CM | POA: Diagnosis not present

## 2022-08-22 DIAGNOSIS — E875 Hyperkalemia: Secondary | ICD-10-CM | POA: Diagnosis not present

## 2022-08-22 DIAGNOSIS — Z992 Dependence on renal dialysis: Secondary | ICD-10-CM | POA: Diagnosis not present

## 2022-08-22 DIAGNOSIS — N186 End stage renal disease: Secondary | ICD-10-CM | POA: Diagnosis not present

## 2022-08-22 DIAGNOSIS — E1122 Type 2 diabetes mellitus with diabetic chronic kidney disease: Secondary | ICD-10-CM | POA: Diagnosis not present

## 2022-08-24 DIAGNOSIS — E875 Hyperkalemia: Secondary | ICD-10-CM | POA: Diagnosis not present

## 2022-08-24 DIAGNOSIS — D689 Coagulation defect, unspecified: Secondary | ICD-10-CM | POA: Diagnosis not present

## 2022-08-24 DIAGNOSIS — D631 Anemia in chronic kidney disease: Secondary | ICD-10-CM | POA: Diagnosis not present

## 2022-08-24 DIAGNOSIS — Z23 Encounter for immunization: Secondary | ICD-10-CM | POA: Diagnosis not present

## 2022-08-24 DIAGNOSIS — D509 Iron deficiency anemia, unspecified: Secondary | ICD-10-CM | POA: Diagnosis not present

## 2022-08-24 DIAGNOSIS — N2581 Secondary hyperparathyroidism of renal origin: Secondary | ICD-10-CM | POA: Diagnosis not present

## 2022-08-24 DIAGNOSIS — E1122 Type 2 diabetes mellitus with diabetic chronic kidney disease: Secondary | ICD-10-CM | POA: Diagnosis not present

## 2022-08-24 DIAGNOSIS — Z992 Dependence on renal dialysis: Secondary | ICD-10-CM | POA: Diagnosis not present

## 2022-08-24 DIAGNOSIS — N186 End stage renal disease: Secondary | ICD-10-CM | POA: Diagnosis not present

## 2022-08-27 DIAGNOSIS — Z992 Dependence on renal dialysis: Secondary | ICD-10-CM | POA: Diagnosis not present

## 2022-08-27 DIAGNOSIS — D631 Anemia in chronic kidney disease: Secondary | ICD-10-CM | POA: Diagnosis not present

## 2022-08-27 DIAGNOSIS — N2581 Secondary hyperparathyroidism of renal origin: Secondary | ICD-10-CM | POA: Diagnosis not present

## 2022-08-27 DIAGNOSIS — E875 Hyperkalemia: Secondary | ICD-10-CM | POA: Diagnosis not present

## 2022-08-27 DIAGNOSIS — D689 Coagulation defect, unspecified: Secondary | ICD-10-CM | POA: Diagnosis not present

## 2022-08-27 DIAGNOSIS — D509 Iron deficiency anemia, unspecified: Secondary | ICD-10-CM | POA: Diagnosis not present

## 2022-08-27 DIAGNOSIS — N186 End stage renal disease: Secondary | ICD-10-CM | POA: Diagnosis not present

## 2022-08-27 DIAGNOSIS — Z23 Encounter for immunization: Secondary | ICD-10-CM | POA: Diagnosis not present

## 2022-08-27 DIAGNOSIS — E1122 Type 2 diabetes mellitus with diabetic chronic kidney disease: Secondary | ICD-10-CM | POA: Diagnosis not present

## 2022-08-27 DIAGNOSIS — N032 Chronic nephritic syndrome with diffuse membranous glomerulonephritis: Secondary | ICD-10-CM | POA: Diagnosis not present

## 2022-08-29 DIAGNOSIS — N186 End stage renal disease: Secondary | ICD-10-CM | POA: Diagnosis not present

## 2022-08-29 DIAGNOSIS — D509 Iron deficiency anemia, unspecified: Secondary | ICD-10-CM | POA: Diagnosis not present

## 2022-08-29 DIAGNOSIS — D689 Coagulation defect, unspecified: Secondary | ICD-10-CM | POA: Diagnosis not present

## 2022-08-29 DIAGNOSIS — D631 Anemia in chronic kidney disease: Secondary | ICD-10-CM | POA: Diagnosis not present

## 2022-08-29 DIAGNOSIS — N2581 Secondary hyperparathyroidism of renal origin: Secondary | ICD-10-CM | POA: Diagnosis not present

## 2022-08-29 DIAGNOSIS — E1122 Type 2 diabetes mellitus with diabetic chronic kidney disease: Secondary | ICD-10-CM | POA: Diagnosis not present

## 2022-08-29 DIAGNOSIS — E875 Hyperkalemia: Secondary | ICD-10-CM | POA: Diagnosis not present

## 2022-08-29 DIAGNOSIS — Z992 Dependence on renal dialysis: Secondary | ICD-10-CM | POA: Diagnosis not present

## 2022-08-31 DIAGNOSIS — N186 End stage renal disease: Secondary | ICD-10-CM | POA: Diagnosis not present

## 2022-08-31 DIAGNOSIS — D509 Iron deficiency anemia, unspecified: Secondary | ICD-10-CM | POA: Diagnosis not present

## 2022-08-31 DIAGNOSIS — D631 Anemia in chronic kidney disease: Secondary | ICD-10-CM | POA: Diagnosis not present

## 2022-08-31 DIAGNOSIS — E875 Hyperkalemia: Secondary | ICD-10-CM | POA: Diagnosis not present

## 2022-08-31 DIAGNOSIS — Z992 Dependence on renal dialysis: Secondary | ICD-10-CM | POA: Diagnosis not present

## 2022-08-31 DIAGNOSIS — D689 Coagulation defect, unspecified: Secondary | ICD-10-CM | POA: Diagnosis not present

## 2022-08-31 DIAGNOSIS — N2581 Secondary hyperparathyroidism of renal origin: Secondary | ICD-10-CM | POA: Diagnosis not present

## 2022-08-31 DIAGNOSIS — E1122 Type 2 diabetes mellitus with diabetic chronic kidney disease: Secondary | ICD-10-CM | POA: Diagnosis not present

## 2022-09-03 ENCOUNTER — Other Ambulatory Visit: Payer: Self-pay | Admitting: Internal Medicine

## 2022-09-03 DIAGNOSIS — E875 Hyperkalemia: Secondary | ICD-10-CM | POA: Diagnosis not present

## 2022-09-03 DIAGNOSIS — D689 Coagulation defect, unspecified: Secondary | ICD-10-CM | POA: Diagnosis not present

## 2022-09-03 DIAGNOSIS — D509 Iron deficiency anemia, unspecified: Secondary | ICD-10-CM | POA: Diagnosis not present

## 2022-09-03 DIAGNOSIS — Z992 Dependence on renal dialysis: Secondary | ICD-10-CM | POA: Diagnosis not present

## 2022-09-03 DIAGNOSIS — N186 End stage renal disease: Secondary | ICD-10-CM | POA: Diagnosis not present

## 2022-09-03 DIAGNOSIS — E1122 Type 2 diabetes mellitus with diabetic chronic kidney disease: Secondary | ICD-10-CM | POA: Diagnosis not present

## 2022-09-03 DIAGNOSIS — D631 Anemia in chronic kidney disease: Secondary | ICD-10-CM | POA: Diagnosis not present

## 2022-09-03 DIAGNOSIS — N2581 Secondary hyperparathyroidism of renal origin: Secondary | ICD-10-CM | POA: Diagnosis not present

## 2022-09-05 DIAGNOSIS — E1122 Type 2 diabetes mellitus with diabetic chronic kidney disease: Secondary | ICD-10-CM | POA: Diagnosis not present

## 2022-09-05 DIAGNOSIS — E875 Hyperkalemia: Secondary | ICD-10-CM | POA: Diagnosis not present

## 2022-09-05 DIAGNOSIS — N2581 Secondary hyperparathyroidism of renal origin: Secondary | ICD-10-CM | POA: Diagnosis not present

## 2022-09-05 DIAGNOSIS — N186 End stage renal disease: Secondary | ICD-10-CM | POA: Diagnosis not present

## 2022-09-05 DIAGNOSIS — D509 Iron deficiency anemia, unspecified: Secondary | ICD-10-CM | POA: Diagnosis not present

## 2022-09-05 DIAGNOSIS — D631 Anemia in chronic kidney disease: Secondary | ICD-10-CM | POA: Diagnosis not present

## 2022-09-05 DIAGNOSIS — Z992 Dependence on renal dialysis: Secondary | ICD-10-CM | POA: Diagnosis not present

## 2022-09-05 DIAGNOSIS — D689 Coagulation defect, unspecified: Secondary | ICD-10-CM | POA: Diagnosis not present

## 2022-09-07 DIAGNOSIS — D509 Iron deficiency anemia, unspecified: Secondary | ICD-10-CM | POA: Diagnosis not present

## 2022-09-07 DIAGNOSIS — D689 Coagulation defect, unspecified: Secondary | ICD-10-CM | POA: Diagnosis not present

## 2022-09-07 DIAGNOSIS — E1122 Type 2 diabetes mellitus with diabetic chronic kidney disease: Secondary | ICD-10-CM | POA: Diagnosis not present

## 2022-09-07 DIAGNOSIS — Z992 Dependence on renal dialysis: Secondary | ICD-10-CM | POA: Diagnosis not present

## 2022-09-07 DIAGNOSIS — D631 Anemia in chronic kidney disease: Secondary | ICD-10-CM | POA: Diagnosis not present

## 2022-09-07 DIAGNOSIS — N186 End stage renal disease: Secondary | ICD-10-CM | POA: Diagnosis not present

## 2022-09-07 DIAGNOSIS — E875 Hyperkalemia: Secondary | ICD-10-CM | POA: Diagnosis not present

## 2022-09-07 DIAGNOSIS — N2581 Secondary hyperparathyroidism of renal origin: Secondary | ICD-10-CM | POA: Diagnosis not present

## 2022-09-10 DIAGNOSIS — E875 Hyperkalemia: Secondary | ICD-10-CM | POA: Diagnosis not present

## 2022-09-10 DIAGNOSIS — Z992 Dependence on renal dialysis: Secondary | ICD-10-CM | POA: Diagnosis not present

## 2022-09-10 DIAGNOSIS — D689 Coagulation defect, unspecified: Secondary | ICD-10-CM | POA: Diagnosis not present

## 2022-09-10 DIAGNOSIS — N186 End stage renal disease: Secondary | ICD-10-CM | POA: Diagnosis not present

## 2022-09-10 DIAGNOSIS — N2581 Secondary hyperparathyroidism of renal origin: Secondary | ICD-10-CM | POA: Diagnosis not present

## 2022-09-10 DIAGNOSIS — D509 Iron deficiency anemia, unspecified: Secondary | ICD-10-CM | POA: Diagnosis not present

## 2022-09-10 DIAGNOSIS — E1122 Type 2 diabetes mellitus with diabetic chronic kidney disease: Secondary | ICD-10-CM | POA: Diagnosis not present

## 2022-09-10 DIAGNOSIS — D631 Anemia in chronic kidney disease: Secondary | ICD-10-CM | POA: Diagnosis not present

## 2022-09-12 DIAGNOSIS — D509 Iron deficiency anemia, unspecified: Secondary | ICD-10-CM | POA: Diagnosis not present

## 2022-09-12 DIAGNOSIS — N186 End stage renal disease: Secondary | ICD-10-CM | POA: Diagnosis not present

## 2022-09-12 DIAGNOSIS — D689 Coagulation defect, unspecified: Secondary | ICD-10-CM | POA: Diagnosis not present

## 2022-09-12 DIAGNOSIS — N2581 Secondary hyperparathyroidism of renal origin: Secondary | ICD-10-CM | POA: Diagnosis not present

## 2022-09-12 DIAGNOSIS — D631 Anemia in chronic kidney disease: Secondary | ICD-10-CM | POA: Diagnosis not present

## 2022-09-12 DIAGNOSIS — E1122 Type 2 diabetes mellitus with diabetic chronic kidney disease: Secondary | ICD-10-CM | POA: Diagnosis not present

## 2022-09-12 DIAGNOSIS — E875 Hyperkalemia: Secondary | ICD-10-CM | POA: Diagnosis not present

## 2022-09-12 DIAGNOSIS — Z992 Dependence on renal dialysis: Secondary | ICD-10-CM | POA: Diagnosis not present

## 2022-09-14 DIAGNOSIS — D631 Anemia in chronic kidney disease: Secondary | ICD-10-CM | POA: Diagnosis not present

## 2022-09-14 DIAGNOSIS — N2581 Secondary hyperparathyroidism of renal origin: Secondary | ICD-10-CM | POA: Diagnosis not present

## 2022-09-14 DIAGNOSIS — D689 Coagulation defect, unspecified: Secondary | ICD-10-CM | POA: Diagnosis not present

## 2022-09-14 DIAGNOSIS — E1122 Type 2 diabetes mellitus with diabetic chronic kidney disease: Secondary | ICD-10-CM | POA: Diagnosis not present

## 2022-09-14 DIAGNOSIS — D509 Iron deficiency anemia, unspecified: Secondary | ICD-10-CM | POA: Diagnosis not present

## 2022-09-14 DIAGNOSIS — E875 Hyperkalemia: Secondary | ICD-10-CM | POA: Diagnosis not present

## 2022-09-14 DIAGNOSIS — N186 End stage renal disease: Secondary | ICD-10-CM | POA: Diagnosis not present

## 2022-09-14 DIAGNOSIS — Z992 Dependence on renal dialysis: Secondary | ICD-10-CM | POA: Diagnosis not present

## 2022-09-16 DIAGNOSIS — N186 End stage renal disease: Secondary | ICD-10-CM | POA: Diagnosis not present

## 2022-09-16 DIAGNOSIS — E1122 Type 2 diabetes mellitus with diabetic chronic kidney disease: Secondary | ICD-10-CM | POA: Diagnosis not present

## 2022-09-16 DIAGNOSIS — D509 Iron deficiency anemia, unspecified: Secondary | ICD-10-CM | POA: Diagnosis not present

## 2022-09-16 DIAGNOSIS — D689 Coagulation defect, unspecified: Secondary | ICD-10-CM | POA: Diagnosis not present

## 2022-09-16 DIAGNOSIS — D631 Anemia in chronic kidney disease: Secondary | ICD-10-CM | POA: Diagnosis not present

## 2022-09-16 DIAGNOSIS — E875 Hyperkalemia: Secondary | ICD-10-CM | POA: Diagnosis not present

## 2022-09-16 DIAGNOSIS — N2581 Secondary hyperparathyroidism of renal origin: Secondary | ICD-10-CM | POA: Diagnosis not present

## 2022-09-16 DIAGNOSIS — Z992 Dependence on renal dialysis: Secondary | ICD-10-CM | POA: Diagnosis not present

## 2022-09-18 DIAGNOSIS — N2581 Secondary hyperparathyroidism of renal origin: Secondary | ICD-10-CM | POA: Diagnosis not present

## 2022-09-18 DIAGNOSIS — D509 Iron deficiency anemia, unspecified: Secondary | ICD-10-CM | POA: Diagnosis not present

## 2022-09-18 DIAGNOSIS — E1122 Type 2 diabetes mellitus with diabetic chronic kidney disease: Secondary | ICD-10-CM | POA: Diagnosis not present

## 2022-09-18 DIAGNOSIS — Z992 Dependence on renal dialysis: Secondary | ICD-10-CM | POA: Diagnosis not present

## 2022-09-18 DIAGNOSIS — D689 Coagulation defect, unspecified: Secondary | ICD-10-CM | POA: Diagnosis not present

## 2022-09-18 DIAGNOSIS — N186 End stage renal disease: Secondary | ICD-10-CM | POA: Diagnosis not present

## 2022-09-18 DIAGNOSIS — D631 Anemia in chronic kidney disease: Secondary | ICD-10-CM | POA: Diagnosis not present

## 2022-09-18 DIAGNOSIS — E875 Hyperkalemia: Secondary | ICD-10-CM | POA: Diagnosis not present

## 2022-09-21 DIAGNOSIS — D689 Coagulation defect, unspecified: Secondary | ICD-10-CM | POA: Diagnosis not present

## 2022-09-21 DIAGNOSIS — N186 End stage renal disease: Secondary | ICD-10-CM | POA: Diagnosis not present

## 2022-09-21 DIAGNOSIS — D631 Anemia in chronic kidney disease: Secondary | ICD-10-CM | POA: Diagnosis not present

## 2022-09-21 DIAGNOSIS — Z992 Dependence on renal dialysis: Secondary | ICD-10-CM | POA: Diagnosis not present

## 2022-09-21 DIAGNOSIS — D509 Iron deficiency anemia, unspecified: Secondary | ICD-10-CM | POA: Diagnosis not present

## 2022-09-21 DIAGNOSIS — N2581 Secondary hyperparathyroidism of renal origin: Secondary | ICD-10-CM | POA: Diagnosis not present

## 2022-09-21 DIAGNOSIS — E875 Hyperkalemia: Secondary | ICD-10-CM | POA: Diagnosis not present

## 2022-09-21 DIAGNOSIS — E1122 Type 2 diabetes mellitus with diabetic chronic kidney disease: Secondary | ICD-10-CM | POA: Diagnosis not present

## 2022-09-24 DIAGNOSIS — E875 Hyperkalemia: Secondary | ICD-10-CM | POA: Diagnosis not present

## 2022-09-24 DIAGNOSIS — Z992 Dependence on renal dialysis: Secondary | ICD-10-CM | POA: Diagnosis not present

## 2022-09-24 DIAGNOSIS — N2581 Secondary hyperparathyroidism of renal origin: Secondary | ICD-10-CM | POA: Diagnosis not present

## 2022-09-24 DIAGNOSIS — N186 End stage renal disease: Secondary | ICD-10-CM | POA: Diagnosis not present

## 2022-09-24 DIAGNOSIS — D631 Anemia in chronic kidney disease: Secondary | ICD-10-CM | POA: Diagnosis not present

## 2022-09-24 DIAGNOSIS — D509 Iron deficiency anemia, unspecified: Secondary | ICD-10-CM | POA: Diagnosis not present

## 2022-09-24 DIAGNOSIS — D689 Coagulation defect, unspecified: Secondary | ICD-10-CM | POA: Diagnosis not present

## 2022-09-24 DIAGNOSIS — E1122 Type 2 diabetes mellitus with diabetic chronic kidney disease: Secondary | ICD-10-CM | POA: Diagnosis not present

## 2022-09-26 DIAGNOSIS — N2581 Secondary hyperparathyroidism of renal origin: Secondary | ICD-10-CM | POA: Diagnosis not present

## 2022-09-26 DIAGNOSIS — E875 Hyperkalemia: Secondary | ICD-10-CM | POA: Diagnosis not present

## 2022-09-26 DIAGNOSIS — N032 Chronic nephritic syndrome with diffuse membranous glomerulonephritis: Secondary | ICD-10-CM | POA: Diagnosis not present

## 2022-09-26 DIAGNOSIS — D509 Iron deficiency anemia, unspecified: Secondary | ICD-10-CM | POA: Diagnosis not present

## 2022-09-26 DIAGNOSIS — D689 Coagulation defect, unspecified: Secondary | ICD-10-CM | POA: Diagnosis not present

## 2022-09-26 DIAGNOSIS — E1122 Type 2 diabetes mellitus with diabetic chronic kidney disease: Secondary | ICD-10-CM | POA: Diagnosis not present

## 2022-09-26 DIAGNOSIS — N186 End stage renal disease: Secondary | ICD-10-CM | POA: Diagnosis not present

## 2022-09-26 DIAGNOSIS — D631 Anemia in chronic kidney disease: Secondary | ICD-10-CM | POA: Diagnosis not present

## 2022-09-26 DIAGNOSIS — Z992 Dependence on renal dialysis: Secondary | ICD-10-CM | POA: Diagnosis not present

## 2022-09-30 DIAGNOSIS — N186 End stage renal disease: Secondary | ICD-10-CM | POA: Diagnosis not present

## 2022-10-01 DIAGNOSIS — D509 Iron deficiency anemia, unspecified: Secondary | ICD-10-CM | POA: Diagnosis not present

## 2022-10-01 DIAGNOSIS — D689 Coagulation defect, unspecified: Secondary | ICD-10-CM | POA: Diagnosis not present

## 2022-10-01 DIAGNOSIS — E1122 Type 2 diabetes mellitus with diabetic chronic kidney disease: Secondary | ICD-10-CM | POA: Diagnosis not present

## 2022-10-01 DIAGNOSIS — D631 Anemia in chronic kidney disease: Secondary | ICD-10-CM | POA: Diagnosis not present

## 2022-10-01 DIAGNOSIS — N186 End stage renal disease: Secondary | ICD-10-CM | POA: Diagnosis not present

## 2022-10-01 DIAGNOSIS — Z992 Dependence on renal dialysis: Secondary | ICD-10-CM | POA: Diagnosis not present

## 2022-10-01 DIAGNOSIS — N2581 Secondary hyperparathyroidism of renal origin: Secondary | ICD-10-CM | POA: Diagnosis not present

## 2022-10-01 DIAGNOSIS — E875 Hyperkalemia: Secondary | ICD-10-CM | POA: Diagnosis not present

## 2022-10-03 DIAGNOSIS — D631 Anemia in chronic kidney disease: Secondary | ICD-10-CM | POA: Diagnosis not present

## 2022-10-03 DIAGNOSIS — N2581 Secondary hyperparathyroidism of renal origin: Secondary | ICD-10-CM | POA: Diagnosis not present

## 2022-10-03 DIAGNOSIS — D689 Coagulation defect, unspecified: Secondary | ICD-10-CM | POA: Diagnosis not present

## 2022-10-03 DIAGNOSIS — D509 Iron deficiency anemia, unspecified: Secondary | ICD-10-CM | POA: Diagnosis not present

## 2022-10-03 DIAGNOSIS — E1122 Type 2 diabetes mellitus with diabetic chronic kidney disease: Secondary | ICD-10-CM | POA: Diagnosis not present

## 2022-10-03 DIAGNOSIS — Z992 Dependence on renal dialysis: Secondary | ICD-10-CM | POA: Diagnosis not present

## 2022-10-03 DIAGNOSIS — E875 Hyperkalemia: Secondary | ICD-10-CM | POA: Diagnosis not present

## 2022-10-03 DIAGNOSIS — N186 End stage renal disease: Secondary | ICD-10-CM | POA: Diagnosis not present

## 2022-10-05 DIAGNOSIS — E1122 Type 2 diabetes mellitus with diabetic chronic kidney disease: Secondary | ICD-10-CM | POA: Diagnosis not present

## 2022-10-05 DIAGNOSIS — E875 Hyperkalemia: Secondary | ICD-10-CM | POA: Diagnosis not present

## 2022-10-05 DIAGNOSIS — N2581 Secondary hyperparathyroidism of renal origin: Secondary | ICD-10-CM | POA: Diagnosis not present

## 2022-10-05 DIAGNOSIS — D509 Iron deficiency anemia, unspecified: Secondary | ICD-10-CM | POA: Diagnosis not present

## 2022-10-05 DIAGNOSIS — Z992 Dependence on renal dialysis: Secondary | ICD-10-CM | POA: Diagnosis not present

## 2022-10-05 DIAGNOSIS — D631 Anemia in chronic kidney disease: Secondary | ICD-10-CM | POA: Diagnosis not present

## 2022-10-05 DIAGNOSIS — D689 Coagulation defect, unspecified: Secondary | ICD-10-CM | POA: Diagnosis not present

## 2022-10-05 DIAGNOSIS — N186 End stage renal disease: Secondary | ICD-10-CM | POA: Diagnosis not present

## 2022-10-08 DIAGNOSIS — N186 End stage renal disease: Secondary | ICD-10-CM | POA: Diagnosis not present

## 2022-10-08 DIAGNOSIS — E1122 Type 2 diabetes mellitus with diabetic chronic kidney disease: Secondary | ICD-10-CM | POA: Diagnosis not present

## 2022-10-08 DIAGNOSIS — D631 Anemia in chronic kidney disease: Secondary | ICD-10-CM | POA: Diagnosis not present

## 2022-10-08 DIAGNOSIS — D509 Iron deficiency anemia, unspecified: Secondary | ICD-10-CM | POA: Diagnosis not present

## 2022-10-08 DIAGNOSIS — D689 Coagulation defect, unspecified: Secondary | ICD-10-CM | POA: Diagnosis not present

## 2022-10-08 DIAGNOSIS — N2581 Secondary hyperparathyroidism of renal origin: Secondary | ICD-10-CM | POA: Diagnosis not present

## 2022-10-08 DIAGNOSIS — E875 Hyperkalemia: Secondary | ICD-10-CM | POA: Diagnosis not present

## 2022-10-08 DIAGNOSIS — Z992 Dependence on renal dialysis: Secondary | ICD-10-CM | POA: Diagnosis not present

## 2022-10-10 ENCOUNTER — Ambulatory Visit (INDEPENDENT_AMBULATORY_CARE_PROVIDER_SITE_OTHER): Payer: Medicare Other

## 2022-10-10 DIAGNOSIS — Z992 Dependence on renal dialysis: Secondary | ICD-10-CM | POA: Diagnosis not present

## 2022-10-10 DIAGNOSIS — I255 Ischemic cardiomyopathy: Secondary | ICD-10-CM | POA: Diagnosis not present

## 2022-10-10 DIAGNOSIS — N2581 Secondary hyperparathyroidism of renal origin: Secondary | ICD-10-CM | POA: Diagnosis not present

## 2022-10-10 DIAGNOSIS — N186 End stage renal disease: Secondary | ICD-10-CM | POA: Diagnosis not present

## 2022-10-10 DIAGNOSIS — D689 Coagulation defect, unspecified: Secondary | ICD-10-CM | POA: Diagnosis not present

## 2022-10-10 DIAGNOSIS — D509 Iron deficiency anemia, unspecified: Secondary | ICD-10-CM | POA: Diagnosis not present

## 2022-10-10 DIAGNOSIS — E875 Hyperkalemia: Secondary | ICD-10-CM | POA: Diagnosis not present

## 2022-10-10 DIAGNOSIS — E1122 Type 2 diabetes mellitus with diabetic chronic kidney disease: Secondary | ICD-10-CM | POA: Diagnosis not present

## 2022-10-10 DIAGNOSIS — D631 Anemia in chronic kidney disease: Secondary | ICD-10-CM | POA: Diagnosis not present

## 2022-10-10 LAB — CUP PACEART REMOTE DEVICE CHECK
Battery Remaining Longevity: 62 mo
Brady Statistic RV Percent Paced: 0.01 %
Date Time Interrogation Session: 20231214012206
HighPow Impedance: 254 Ohm
HighPow Impedance: 57 Ohm
Implantable Lead Connection Status: 753985
Implantable Lead Implant Date: 20210315
Implantable Lead Location: 753860
Implantable Lead Model: 6935
Implantable Pulse Generator Implant Date: 20190208
Lead Channel Impedance Value: 304 Ohm
Lead Channel Impedance Value: 399 Ohm
Lead Channel Pacing Threshold Amplitude: 0.5 V
Lead Channel Pacing Threshold Pulse Width: 0.4 ms
Lead Channel Sensing Intrinsic Amplitude: 11.625 mV
Lead Channel Sensing Intrinsic Amplitude: 11.625 mV
Lead Channel Setting Pacing Amplitude: 2 V
Lead Channel Setting Pacing Pulse Width: 0.4 ms
Lead Channel Setting Sensing Sensitivity: 0.3 mV
Zone Setting Status: 755011

## 2022-10-12 DIAGNOSIS — D631 Anemia in chronic kidney disease: Secondary | ICD-10-CM | POA: Diagnosis not present

## 2022-10-12 DIAGNOSIS — D689 Coagulation defect, unspecified: Secondary | ICD-10-CM | POA: Diagnosis not present

## 2022-10-12 DIAGNOSIS — Z992 Dependence on renal dialysis: Secondary | ICD-10-CM | POA: Diagnosis not present

## 2022-10-12 DIAGNOSIS — N186 End stage renal disease: Secondary | ICD-10-CM | POA: Diagnosis not present

## 2022-10-12 DIAGNOSIS — E1122 Type 2 diabetes mellitus with diabetic chronic kidney disease: Secondary | ICD-10-CM | POA: Diagnosis not present

## 2022-10-12 DIAGNOSIS — E875 Hyperkalemia: Secondary | ICD-10-CM | POA: Diagnosis not present

## 2022-10-12 DIAGNOSIS — N2581 Secondary hyperparathyroidism of renal origin: Secondary | ICD-10-CM | POA: Diagnosis not present

## 2022-10-12 DIAGNOSIS — D509 Iron deficiency anemia, unspecified: Secondary | ICD-10-CM | POA: Diagnosis not present

## 2022-10-14 DIAGNOSIS — D631 Anemia in chronic kidney disease: Secondary | ICD-10-CM | POA: Diagnosis not present

## 2022-10-14 DIAGNOSIS — Z992 Dependence on renal dialysis: Secondary | ICD-10-CM | POA: Diagnosis not present

## 2022-10-14 DIAGNOSIS — N186 End stage renal disease: Secondary | ICD-10-CM | POA: Diagnosis not present

## 2022-10-14 DIAGNOSIS — D509 Iron deficiency anemia, unspecified: Secondary | ICD-10-CM | POA: Diagnosis not present

## 2022-10-14 DIAGNOSIS — D689 Coagulation defect, unspecified: Secondary | ICD-10-CM | POA: Diagnosis not present

## 2022-10-14 DIAGNOSIS — E1122 Type 2 diabetes mellitus with diabetic chronic kidney disease: Secondary | ICD-10-CM | POA: Diagnosis not present

## 2022-10-14 DIAGNOSIS — E875 Hyperkalemia: Secondary | ICD-10-CM | POA: Diagnosis not present

## 2022-10-14 DIAGNOSIS — N2581 Secondary hyperparathyroidism of renal origin: Secondary | ICD-10-CM | POA: Diagnosis not present

## 2022-10-15 DIAGNOSIS — N186 End stage renal disease: Secondary | ICD-10-CM | POA: Diagnosis not present

## 2022-10-15 DIAGNOSIS — E1122 Type 2 diabetes mellitus with diabetic chronic kidney disease: Secondary | ICD-10-CM | POA: Diagnosis not present

## 2022-10-15 DIAGNOSIS — Z992 Dependence on renal dialysis: Secondary | ICD-10-CM | POA: Diagnosis not present

## 2022-10-15 DIAGNOSIS — E875 Hyperkalemia: Secondary | ICD-10-CM | POA: Diagnosis not present

## 2022-10-15 DIAGNOSIS — D509 Iron deficiency anemia, unspecified: Secondary | ICD-10-CM | POA: Diagnosis not present

## 2022-10-15 DIAGNOSIS — D689 Coagulation defect, unspecified: Secondary | ICD-10-CM | POA: Diagnosis not present

## 2022-10-15 DIAGNOSIS — N2581 Secondary hyperparathyroidism of renal origin: Secondary | ICD-10-CM | POA: Diagnosis not present

## 2022-10-15 DIAGNOSIS — D631 Anemia in chronic kidney disease: Secondary | ICD-10-CM | POA: Diagnosis not present

## 2022-10-17 DIAGNOSIS — N186 End stage renal disease: Secondary | ICD-10-CM | POA: Diagnosis not present

## 2022-10-17 DIAGNOSIS — Z992 Dependence on renal dialysis: Secondary | ICD-10-CM | POA: Diagnosis not present

## 2022-10-17 DIAGNOSIS — D631 Anemia in chronic kidney disease: Secondary | ICD-10-CM | POA: Diagnosis not present

## 2022-10-17 DIAGNOSIS — D509 Iron deficiency anemia, unspecified: Secondary | ICD-10-CM | POA: Diagnosis not present

## 2022-10-17 DIAGNOSIS — E1122 Type 2 diabetes mellitus with diabetic chronic kidney disease: Secondary | ICD-10-CM | POA: Diagnosis not present

## 2022-10-17 DIAGNOSIS — E875 Hyperkalemia: Secondary | ICD-10-CM | POA: Diagnosis not present

## 2022-10-17 DIAGNOSIS — N2581 Secondary hyperparathyroidism of renal origin: Secondary | ICD-10-CM | POA: Diagnosis not present

## 2022-10-17 DIAGNOSIS — D689 Coagulation defect, unspecified: Secondary | ICD-10-CM | POA: Diagnosis not present

## 2022-10-19 DIAGNOSIS — E1122 Type 2 diabetes mellitus with diabetic chronic kidney disease: Secondary | ICD-10-CM | POA: Diagnosis not present

## 2022-10-19 DIAGNOSIS — D689 Coagulation defect, unspecified: Secondary | ICD-10-CM | POA: Diagnosis not present

## 2022-10-19 DIAGNOSIS — N2581 Secondary hyperparathyroidism of renal origin: Secondary | ICD-10-CM | POA: Diagnosis not present

## 2022-10-19 DIAGNOSIS — D631 Anemia in chronic kidney disease: Secondary | ICD-10-CM | POA: Diagnosis not present

## 2022-10-19 DIAGNOSIS — Z992 Dependence on renal dialysis: Secondary | ICD-10-CM | POA: Diagnosis not present

## 2022-10-19 DIAGNOSIS — E875 Hyperkalemia: Secondary | ICD-10-CM | POA: Diagnosis not present

## 2022-10-19 DIAGNOSIS — N186 End stage renal disease: Secondary | ICD-10-CM | POA: Diagnosis not present

## 2022-10-19 DIAGNOSIS — D509 Iron deficiency anemia, unspecified: Secondary | ICD-10-CM | POA: Diagnosis not present

## 2022-10-22 DIAGNOSIS — N2581 Secondary hyperparathyroidism of renal origin: Secondary | ICD-10-CM | POA: Diagnosis not present

## 2022-10-22 DIAGNOSIS — E875 Hyperkalemia: Secondary | ICD-10-CM | POA: Diagnosis not present

## 2022-10-22 DIAGNOSIS — Z992 Dependence on renal dialysis: Secondary | ICD-10-CM | POA: Diagnosis not present

## 2022-10-22 DIAGNOSIS — E1122 Type 2 diabetes mellitus with diabetic chronic kidney disease: Secondary | ICD-10-CM | POA: Diagnosis not present

## 2022-10-22 DIAGNOSIS — D509 Iron deficiency anemia, unspecified: Secondary | ICD-10-CM | POA: Diagnosis not present

## 2022-10-22 DIAGNOSIS — D689 Coagulation defect, unspecified: Secondary | ICD-10-CM | POA: Diagnosis not present

## 2022-10-22 DIAGNOSIS — D631 Anemia in chronic kidney disease: Secondary | ICD-10-CM | POA: Diagnosis not present

## 2022-10-22 DIAGNOSIS — N186 End stage renal disease: Secondary | ICD-10-CM | POA: Diagnosis not present

## 2022-10-23 DIAGNOSIS — Z79899 Other long term (current) drug therapy: Secondary | ICD-10-CM | POA: Diagnosis not present

## 2022-10-23 DIAGNOSIS — I7782 Antineutrophilic cytoplasmic antibody (ANCA) vasculitis: Secondary | ICD-10-CM | POA: Diagnosis not present

## 2022-10-23 DIAGNOSIS — Z79620 Long term (current) use of immunosuppressive biologic: Secondary | ICD-10-CM | POA: Diagnosis not present

## 2022-10-23 DIAGNOSIS — Z5181 Encounter for therapeutic drug level monitoring: Secondary | ICD-10-CM | POA: Diagnosis not present

## 2022-10-23 DIAGNOSIS — M3131 Wegener's granulomatosis with renal involvement: Secondary | ICD-10-CM | POA: Diagnosis not present

## 2022-10-24 DIAGNOSIS — Z992 Dependence on renal dialysis: Secondary | ICD-10-CM | POA: Diagnosis not present

## 2022-10-24 DIAGNOSIS — N186 End stage renal disease: Secondary | ICD-10-CM | POA: Diagnosis not present

## 2022-10-24 DIAGNOSIS — D631 Anemia in chronic kidney disease: Secondary | ICD-10-CM | POA: Diagnosis not present

## 2022-10-24 DIAGNOSIS — E1122 Type 2 diabetes mellitus with diabetic chronic kidney disease: Secondary | ICD-10-CM | POA: Diagnosis not present

## 2022-10-24 DIAGNOSIS — E875 Hyperkalemia: Secondary | ICD-10-CM | POA: Diagnosis not present

## 2022-10-24 DIAGNOSIS — D509 Iron deficiency anemia, unspecified: Secondary | ICD-10-CM | POA: Diagnosis not present

## 2022-10-24 DIAGNOSIS — D689 Coagulation defect, unspecified: Secondary | ICD-10-CM | POA: Diagnosis not present

## 2022-10-24 DIAGNOSIS — N2581 Secondary hyperparathyroidism of renal origin: Secondary | ICD-10-CM | POA: Diagnosis not present

## 2022-10-26 DIAGNOSIS — D631 Anemia in chronic kidney disease: Secondary | ICD-10-CM | POA: Diagnosis not present

## 2022-10-26 DIAGNOSIS — Z992 Dependence on renal dialysis: Secondary | ICD-10-CM | POA: Diagnosis not present

## 2022-10-26 DIAGNOSIS — E1122 Type 2 diabetes mellitus with diabetic chronic kidney disease: Secondary | ICD-10-CM | POA: Diagnosis not present

## 2022-10-26 DIAGNOSIS — D509 Iron deficiency anemia, unspecified: Secondary | ICD-10-CM | POA: Diagnosis not present

## 2022-10-26 DIAGNOSIS — N186 End stage renal disease: Secondary | ICD-10-CM | POA: Diagnosis not present

## 2022-10-26 DIAGNOSIS — D689 Coagulation defect, unspecified: Secondary | ICD-10-CM | POA: Diagnosis not present

## 2022-10-26 DIAGNOSIS — N2581 Secondary hyperparathyroidism of renal origin: Secondary | ICD-10-CM | POA: Diagnosis not present

## 2022-10-26 DIAGNOSIS — E875 Hyperkalemia: Secondary | ICD-10-CM | POA: Diagnosis not present

## 2022-10-27 DIAGNOSIS — Z992 Dependence on renal dialysis: Secondary | ICD-10-CM | POA: Diagnosis not present

## 2022-10-27 DIAGNOSIS — N032 Chronic nephritic syndrome with diffuse membranous glomerulonephritis: Secondary | ICD-10-CM | POA: Diagnosis not present

## 2022-10-27 DIAGNOSIS — N186 End stage renal disease: Secondary | ICD-10-CM | POA: Diagnosis not present

## 2022-10-29 DIAGNOSIS — D631 Anemia in chronic kidney disease: Secondary | ICD-10-CM | POA: Diagnosis not present

## 2022-10-29 DIAGNOSIS — D689 Coagulation defect, unspecified: Secondary | ICD-10-CM | POA: Diagnosis not present

## 2022-10-29 DIAGNOSIS — E875 Hyperkalemia: Secondary | ICD-10-CM | POA: Diagnosis not present

## 2022-10-29 DIAGNOSIS — Z992 Dependence on renal dialysis: Secondary | ICD-10-CM | POA: Diagnosis not present

## 2022-10-29 DIAGNOSIS — N186 End stage renal disease: Secondary | ICD-10-CM | POA: Diagnosis not present

## 2022-10-29 DIAGNOSIS — N2581 Secondary hyperparathyroidism of renal origin: Secondary | ICD-10-CM | POA: Diagnosis not present

## 2022-10-31 DIAGNOSIS — D689 Coagulation defect, unspecified: Secondary | ICD-10-CM | POA: Diagnosis not present

## 2022-10-31 DIAGNOSIS — D631 Anemia in chronic kidney disease: Secondary | ICD-10-CM | POA: Diagnosis not present

## 2022-10-31 DIAGNOSIS — N2581 Secondary hyperparathyroidism of renal origin: Secondary | ICD-10-CM | POA: Diagnosis not present

## 2022-10-31 DIAGNOSIS — N186 End stage renal disease: Secondary | ICD-10-CM | POA: Diagnosis not present

## 2022-10-31 DIAGNOSIS — Z992 Dependence on renal dialysis: Secondary | ICD-10-CM | POA: Diagnosis not present

## 2022-10-31 DIAGNOSIS — E875 Hyperkalemia: Secondary | ICD-10-CM | POA: Diagnosis not present

## 2022-11-01 NOTE — Progress Notes (Signed)
Remote ICD transmission.   

## 2022-11-02 DIAGNOSIS — Z992 Dependence on renal dialysis: Secondary | ICD-10-CM | POA: Diagnosis not present

## 2022-11-02 DIAGNOSIS — N186 End stage renal disease: Secondary | ICD-10-CM | POA: Diagnosis not present

## 2022-11-02 DIAGNOSIS — D689 Coagulation defect, unspecified: Secondary | ICD-10-CM | POA: Diagnosis not present

## 2022-11-02 DIAGNOSIS — E875 Hyperkalemia: Secondary | ICD-10-CM | POA: Diagnosis not present

## 2022-11-02 DIAGNOSIS — D631 Anemia in chronic kidney disease: Secondary | ICD-10-CM | POA: Diagnosis not present

## 2022-11-02 DIAGNOSIS — N2581 Secondary hyperparathyroidism of renal origin: Secondary | ICD-10-CM | POA: Diagnosis not present

## 2022-11-05 DIAGNOSIS — N186 End stage renal disease: Secondary | ICD-10-CM | POA: Diagnosis not present

## 2022-11-05 DIAGNOSIS — Z992 Dependence on renal dialysis: Secondary | ICD-10-CM | POA: Diagnosis not present

## 2022-11-05 DIAGNOSIS — D631 Anemia in chronic kidney disease: Secondary | ICD-10-CM | POA: Diagnosis not present

## 2022-11-05 DIAGNOSIS — D689 Coagulation defect, unspecified: Secondary | ICD-10-CM | POA: Diagnosis not present

## 2022-11-05 DIAGNOSIS — E875 Hyperkalemia: Secondary | ICD-10-CM | POA: Diagnosis not present

## 2022-11-05 DIAGNOSIS — N2581 Secondary hyperparathyroidism of renal origin: Secondary | ICD-10-CM | POA: Diagnosis not present

## 2022-11-07 DIAGNOSIS — D689 Coagulation defect, unspecified: Secondary | ICD-10-CM | POA: Diagnosis not present

## 2022-11-07 DIAGNOSIS — N186 End stage renal disease: Secondary | ICD-10-CM | POA: Diagnosis not present

## 2022-11-07 DIAGNOSIS — E875 Hyperkalemia: Secondary | ICD-10-CM | POA: Diagnosis not present

## 2022-11-07 DIAGNOSIS — D631 Anemia in chronic kidney disease: Secondary | ICD-10-CM | POA: Diagnosis not present

## 2022-11-07 DIAGNOSIS — N2581 Secondary hyperparathyroidism of renal origin: Secondary | ICD-10-CM | POA: Diagnosis not present

## 2022-11-07 DIAGNOSIS — Z992 Dependence on renal dialysis: Secondary | ICD-10-CM | POA: Diagnosis not present

## 2022-11-09 DIAGNOSIS — Z992 Dependence on renal dialysis: Secondary | ICD-10-CM | POA: Diagnosis not present

## 2022-11-09 DIAGNOSIS — N186 End stage renal disease: Secondary | ICD-10-CM | POA: Diagnosis not present

## 2022-11-09 DIAGNOSIS — D631 Anemia in chronic kidney disease: Secondary | ICD-10-CM | POA: Diagnosis not present

## 2022-11-09 DIAGNOSIS — N2581 Secondary hyperparathyroidism of renal origin: Secondary | ICD-10-CM | POA: Diagnosis not present

## 2022-11-09 DIAGNOSIS — E875 Hyperkalemia: Secondary | ICD-10-CM | POA: Diagnosis not present

## 2022-11-09 DIAGNOSIS — D689 Coagulation defect, unspecified: Secondary | ICD-10-CM | POA: Diagnosis not present

## 2022-11-12 DIAGNOSIS — D689 Coagulation defect, unspecified: Secondary | ICD-10-CM | POA: Diagnosis not present

## 2022-11-12 DIAGNOSIS — D631 Anemia in chronic kidney disease: Secondary | ICD-10-CM | POA: Diagnosis not present

## 2022-11-12 DIAGNOSIS — N2581 Secondary hyperparathyroidism of renal origin: Secondary | ICD-10-CM | POA: Diagnosis not present

## 2022-11-12 DIAGNOSIS — N186 End stage renal disease: Secondary | ICD-10-CM | POA: Diagnosis not present

## 2022-11-12 DIAGNOSIS — E875 Hyperkalemia: Secondary | ICD-10-CM | POA: Diagnosis not present

## 2022-11-12 DIAGNOSIS — Z992 Dependence on renal dialysis: Secondary | ICD-10-CM | POA: Diagnosis not present

## 2022-11-14 DIAGNOSIS — N2581 Secondary hyperparathyroidism of renal origin: Secondary | ICD-10-CM | POA: Diagnosis not present

## 2022-11-14 DIAGNOSIS — E875 Hyperkalemia: Secondary | ICD-10-CM | POA: Diagnosis not present

## 2022-11-14 DIAGNOSIS — N186 End stage renal disease: Secondary | ICD-10-CM | POA: Diagnosis not present

## 2022-11-14 DIAGNOSIS — D631 Anemia in chronic kidney disease: Secondary | ICD-10-CM | POA: Diagnosis not present

## 2022-11-14 DIAGNOSIS — D689 Coagulation defect, unspecified: Secondary | ICD-10-CM | POA: Diagnosis not present

## 2022-11-14 DIAGNOSIS — Z992 Dependence on renal dialysis: Secondary | ICD-10-CM | POA: Diagnosis not present

## 2022-11-16 DIAGNOSIS — D631 Anemia in chronic kidney disease: Secondary | ICD-10-CM | POA: Diagnosis not present

## 2022-11-16 DIAGNOSIS — N186 End stage renal disease: Secondary | ICD-10-CM | POA: Diagnosis not present

## 2022-11-16 DIAGNOSIS — E875 Hyperkalemia: Secondary | ICD-10-CM | POA: Diagnosis not present

## 2022-11-16 DIAGNOSIS — N2581 Secondary hyperparathyroidism of renal origin: Secondary | ICD-10-CM | POA: Diagnosis not present

## 2022-11-16 DIAGNOSIS — D689 Coagulation defect, unspecified: Secondary | ICD-10-CM | POA: Diagnosis not present

## 2022-11-16 DIAGNOSIS — Z992 Dependence on renal dialysis: Secondary | ICD-10-CM | POA: Diagnosis not present

## 2022-11-19 DIAGNOSIS — N2581 Secondary hyperparathyroidism of renal origin: Secondary | ICD-10-CM | POA: Diagnosis not present

## 2022-11-19 DIAGNOSIS — E875 Hyperkalemia: Secondary | ICD-10-CM | POA: Diagnosis not present

## 2022-11-19 DIAGNOSIS — N186 End stage renal disease: Secondary | ICD-10-CM | POA: Diagnosis not present

## 2022-11-19 DIAGNOSIS — D631 Anemia in chronic kidney disease: Secondary | ICD-10-CM | POA: Diagnosis not present

## 2022-11-19 DIAGNOSIS — D689 Coagulation defect, unspecified: Secondary | ICD-10-CM | POA: Diagnosis not present

## 2022-11-19 DIAGNOSIS — Z992 Dependence on renal dialysis: Secondary | ICD-10-CM | POA: Diagnosis not present

## 2022-11-21 DIAGNOSIS — Z992 Dependence on renal dialysis: Secondary | ICD-10-CM | POA: Diagnosis not present

## 2022-11-21 DIAGNOSIS — E875 Hyperkalemia: Secondary | ICD-10-CM | POA: Diagnosis not present

## 2022-11-21 DIAGNOSIS — N186 End stage renal disease: Secondary | ICD-10-CM | POA: Diagnosis not present

## 2022-11-21 DIAGNOSIS — D689 Coagulation defect, unspecified: Secondary | ICD-10-CM | POA: Diagnosis not present

## 2022-11-21 DIAGNOSIS — N2581 Secondary hyperparathyroidism of renal origin: Secondary | ICD-10-CM | POA: Diagnosis not present

## 2022-11-21 DIAGNOSIS — D631 Anemia in chronic kidney disease: Secondary | ICD-10-CM | POA: Diagnosis not present

## 2022-11-23 DIAGNOSIS — E875 Hyperkalemia: Secondary | ICD-10-CM | POA: Diagnosis not present

## 2022-11-23 DIAGNOSIS — N186 End stage renal disease: Secondary | ICD-10-CM | POA: Diagnosis not present

## 2022-11-23 DIAGNOSIS — N2581 Secondary hyperparathyroidism of renal origin: Secondary | ICD-10-CM | POA: Diagnosis not present

## 2022-11-23 DIAGNOSIS — Z992 Dependence on renal dialysis: Secondary | ICD-10-CM | POA: Diagnosis not present

## 2022-11-23 DIAGNOSIS — D631 Anemia in chronic kidney disease: Secondary | ICD-10-CM | POA: Diagnosis not present

## 2022-11-23 DIAGNOSIS — D689 Coagulation defect, unspecified: Secondary | ICD-10-CM | POA: Diagnosis not present

## 2022-11-26 DIAGNOSIS — N186 End stage renal disease: Secondary | ICD-10-CM | POA: Diagnosis not present

## 2022-11-26 DIAGNOSIS — D631 Anemia in chronic kidney disease: Secondary | ICD-10-CM | POA: Diagnosis not present

## 2022-11-26 DIAGNOSIS — D689 Coagulation defect, unspecified: Secondary | ICD-10-CM | POA: Diagnosis not present

## 2022-11-26 DIAGNOSIS — Z992 Dependence on renal dialysis: Secondary | ICD-10-CM | POA: Diagnosis not present

## 2022-11-26 DIAGNOSIS — N2581 Secondary hyperparathyroidism of renal origin: Secondary | ICD-10-CM | POA: Diagnosis not present

## 2022-11-26 DIAGNOSIS — E875 Hyperkalemia: Secondary | ICD-10-CM | POA: Diagnosis not present

## 2022-11-27 DIAGNOSIS — Z992 Dependence on renal dialysis: Secondary | ICD-10-CM | POA: Diagnosis not present

## 2022-11-27 DIAGNOSIS — N186 End stage renal disease: Secondary | ICD-10-CM | POA: Diagnosis not present

## 2022-11-27 DIAGNOSIS — N032 Chronic nephritic syndrome with diffuse membranous glomerulonephritis: Secondary | ICD-10-CM | POA: Diagnosis not present

## 2022-11-28 DIAGNOSIS — D631 Anemia in chronic kidney disease: Secondary | ICD-10-CM | POA: Diagnosis not present

## 2022-11-28 DIAGNOSIS — D509 Iron deficiency anemia, unspecified: Secondary | ICD-10-CM | POA: Diagnosis not present

## 2022-11-28 DIAGNOSIS — D689 Coagulation defect, unspecified: Secondary | ICD-10-CM | POA: Diagnosis not present

## 2022-11-28 DIAGNOSIS — E875 Hyperkalemia: Secondary | ICD-10-CM | POA: Diagnosis not present

## 2022-11-28 DIAGNOSIS — E1122 Type 2 diabetes mellitus with diabetic chronic kidney disease: Secondary | ICD-10-CM | POA: Diagnosis not present

## 2022-11-28 DIAGNOSIS — Z992 Dependence on renal dialysis: Secondary | ICD-10-CM | POA: Diagnosis not present

## 2022-11-28 DIAGNOSIS — N186 End stage renal disease: Secondary | ICD-10-CM | POA: Diagnosis not present

## 2022-11-28 DIAGNOSIS — N2581 Secondary hyperparathyroidism of renal origin: Secondary | ICD-10-CM | POA: Diagnosis not present

## 2022-11-29 DIAGNOSIS — N186 End stage renal disease: Secondary | ICD-10-CM | POA: Diagnosis not present

## 2022-11-30 DIAGNOSIS — D509 Iron deficiency anemia, unspecified: Secondary | ICD-10-CM | POA: Diagnosis not present

## 2022-11-30 DIAGNOSIS — Z992 Dependence on renal dialysis: Secondary | ICD-10-CM | POA: Diagnosis not present

## 2022-11-30 DIAGNOSIS — E875 Hyperkalemia: Secondary | ICD-10-CM | POA: Diagnosis not present

## 2022-11-30 DIAGNOSIS — D689 Coagulation defect, unspecified: Secondary | ICD-10-CM | POA: Diagnosis not present

## 2022-11-30 DIAGNOSIS — E1122 Type 2 diabetes mellitus with diabetic chronic kidney disease: Secondary | ICD-10-CM | POA: Diagnosis not present

## 2022-11-30 DIAGNOSIS — N186 End stage renal disease: Secondary | ICD-10-CM | POA: Diagnosis not present

## 2022-11-30 DIAGNOSIS — D631 Anemia in chronic kidney disease: Secondary | ICD-10-CM | POA: Diagnosis not present

## 2022-11-30 DIAGNOSIS — N2581 Secondary hyperparathyroidism of renal origin: Secondary | ICD-10-CM | POA: Diagnosis not present

## 2022-12-03 DIAGNOSIS — D631 Anemia in chronic kidney disease: Secondary | ICD-10-CM | POA: Diagnosis not present

## 2022-12-03 DIAGNOSIS — E875 Hyperkalemia: Secondary | ICD-10-CM | POA: Diagnosis not present

## 2022-12-03 DIAGNOSIS — Z992 Dependence on renal dialysis: Secondary | ICD-10-CM | POA: Diagnosis not present

## 2022-12-03 DIAGNOSIS — D509 Iron deficiency anemia, unspecified: Secondary | ICD-10-CM | POA: Diagnosis not present

## 2022-12-03 DIAGNOSIS — E1122 Type 2 diabetes mellitus with diabetic chronic kidney disease: Secondary | ICD-10-CM | POA: Diagnosis not present

## 2022-12-03 DIAGNOSIS — D689 Coagulation defect, unspecified: Secondary | ICD-10-CM | POA: Diagnosis not present

## 2022-12-03 DIAGNOSIS — N186 End stage renal disease: Secondary | ICD-10-CM | POA: Diagnosis not present

## 2022-12-03 DIAGNOSIS — N2581 Secondary hyperparathyroidism of renal origin: Secondary | ICD-10-CM | POA: Diagnosis not present

## 2022-12-05 DIAGNOSIS — D509 Iron deficiency anemia, unspecified: Secondary | ICD-10-CM | POA: Diagnosis not present

## 2022-12-05 DIAGNOSIS — E1122 Type 2 diabetes mellitus with diabetic chronic kidney disease: Secondary | ICD-10-CM | POA: Diagnosis not present

## 2022-12-05 DIAGNOSIS — Z992 Dependence on renal dialysis: Secondary | ICD-10-CM | POA: Diagnosis not present

## 2022-12-05 DIAGNOSIS — E875 Hyperkalemia: Secondary | ICD-10-CM | POA: Diagnosis not present

## 2022-12-05 DIAGNOSIS — N186 End stage renal disease: Secondary | ICD-10-CM | POA: Diagnosis not present

## 2022-12-05 DIAGNOSIS — D689 Coagulation defect, unspecified: Secondary | ICD-10-CM | POA: Diagnosis not present

## 2022-12-05 DIAGNOSIS — D631 Anemia in chronic kidney disease: Secondary | ICD-10-CM | POA: Diagnosis not present

## 2022-12-05 DIAGNOSIS — N2581 Secondary hyperparathyroidism of renal origin: Secondary | ICD-10-CM | POA: Diagnosis not present

## 2022-12-06 DIAGNOSIS — M3131 Wegener's granulomatosis with renal involvement: Secondary | ICD-10-CM | POA: Diagnosis not present

## 2022-12-06 DIAGNOSIS — I7782 Antineutrophilic cytoplasmic antibody (ANCA) vasculitis: Secondary | ICD-10-CM | POA: Diagnosis not present

## 2022-12-06 DIAGNOSIS — H0520 Unspecified exophthalmos: Secondary | ICD-10-CM | POA: Diagnosis not present

## 2022-12-06 DIAGNOSIS — H0589 Other disorders of orbit: Secondary | ICD-10-CM | POA: Diagnosis not present

## 2022-12-07 DIAGNOSIS — E875 Hyperkalemia: Secondary | ICD-10-CM | POA: Diagnosis not present

## 2022-12-07 DIAGNOSIS — D631 Anemia in chronic kidney disease: Secondary | ICD-10-CM | POA: Diagnosis not present

## 2022-12-07 DIAGNOSIS — E1122 Type 2 diabetes mellitus with diabetic chronic kidney disease: Secondary | ICD-10-CM | POA: Diagnosis not present

## 2022-12-07 DIAGNOSIS — D689 Coagulation defect, unspecified: Secondary | ICD-10-CM | POA: Diagnosis not present

## 2022-12-07 DIAGNOSIS — N2581 Secondary hyperparathyroidism of renal origin: Secondary | ICD-10-CM | POA: Diagnosis not present

## 2022-12-07 DIAGNOSIS — Z992 Dependence on renal dialysis: Secondary | ICD-10-CM | POA: Diagnosis not present

## 2022-12-07 DIAGNOSIS — D509 Iron deficiency anemia, unspecified: Secondary | ICD-10-CM | POA: Diagnosis not present

## 2022-12-07 DIAGNOSIS — N186 End stage renal disease: Secondary | ICD-10-CM | POA: Diagnosis not present

## 2022-12-10 DIAGNOSIS — D631 Anemia in chronic kidney disease: Secondary | ICD-10-CM | POA: Diagnosis not present

## 2022-12-10 DIAGNOSIS — Z992 Dependence on renal dialysis: Secondary | ICD-10-CM | POA: Diagnosis not present

## 2022-12-10 DIAGNOSIS — E1122 Type 2 diabetes mellitus with diabetic chronic kidney disease: Secondary | ICD-10-CM | POA: Diagnosis not present

## 2022-12-10 DIAGNOSIS — N186 End stage renal disease: Secondary | ICD-10-CM | POA: Diagnosis not present

## 2022-12-10 DIAGNOSIS — N2581 Secondary hyperparathyroidism of renal origin: Secondary | ICD-10-CM | POA: Diagnosis not present

## 2022-12-10 DIAGNOSIS — D689 Coagulation defect, unspecified: Secondary | ICD-10-CM | POA: Diagnosis not present

## 2022-12-10 DIAGNOSIS — E875 Hyperkalemia: Secondary | ICD-10-CM | POA: Diagnosis not present

## 2022-12-10 DIAGNOSIS — D509 Iron deficiency anemia, unspecified: Secondary | ICD-10-CM | POA: Diagnosis not present

## 2022-12-12 DIAGNOSIS — N2581 Secondary hyperparathyroidism of renal origin: Secondary | ICD-10-CM | POA: Diagnosis not present

## 2022-12-12 DIAGNOSIS — N186 End stage renal disease: Secondary | ICD-10-CM | POA: Diagnosis not present

## 2022-12-12 DIAGNOSIS — D631 Anemia in chronic kidney disease: Secondary | ICD-10-CM | POA: Diagnosis not present

## 2022-12-12 DIAGNOSIS — Z992 Dependence on renal dialysis: Secondary | ICD-10-CM | POA: Diagnosis not present

## 2022-12-12 DIAGNOSIS — D509 Iron deficiency anemia, unspecified: Secondary | ICD-10-CM | POA: Diagnosis not present

## 2022-12-12 DIAGNOSIS — E875 Hyperkalemia: Secondary | ICD-10-CM | POA: Diagnosis not present

## 2022-12-12 DIAGNOSIS — D689 Coagulation defect, unspecified: Secondary | ICD-10-CM | POA: Diagnosis not present

## 2022-12-12 DIAGNOSIS — E1122 Type 2 diabetes mellitus with diabetic chronic kidney disease: Secondary | ICD-10-CM | POA: Diagnosis not present

## 2022-12-14 DIAGNOSIS — E1122 Type 2 diabetes mellitus with diabetic chronic kidney disease: Secondary | ICD-10-CM | POA: Diagnosis not present

## 2022-12-14 DIAGNOSIS — E875 Hyperkalemia: Secondary | ICD-10-CM | POA: Diagnosis not present

## 2022-12-14 DIAGNOSIS — D631 Anemia in chronic kidney disease: Secondary | ICD-10-CM | POA: Diagnosis not present

## 2022-12-14 DIAGNOSIS — N186 End stage renal disease: Secondary | ICD-10-CM | POA: Diagnosis not present

## 2022-12-14 DIAGNOSIS — Z992 Dependence on renal dialysis: Secondary | ICD-10-CM | POA: Diagnosis not present

## 2022-12-14 DIAGNOSIS — N2581 Secondary hyperparathyroidism of renal origin: Secondary | ICD-10-CM | POA: Diagnosis not present

## 2022-12-14 DIAGNOSIS — D689 Coagulation defect, unspecified: Secondary | ICD-10-CM | POA: Diagnosis not present

## 2022-12-14 DIAGNOSIS — D509 Iron deficiency anemia, unspecified: Secondary | ICD-10-CM | POA: Diagnosis not present

## 2022-12-17 DIAGNOSIS — Z992 Dependence on renal dialysis: Secondary | ICD-10-CM | POA: Diagnosis not present

## 2022-12-17 DIAGNOSIS — D689 Coagulation defect, unspecified: Secondary | ICD-10-CM | POA: Diagnosis not present

## 2022-12-17 DIAGNOSIS — D509 Iron deficiency anemia, unspecified: Secondary | ICD-10-CM | POA: Diagnosis not present

## 2022-12-17 DIAGNOSIS — N186 End stage renal disease: Secondary | ICD-10-CM | POA: Diagnosis not present

## 2022-12-17 DIAGNOSIS — D631 Anemia in chronic kidney disease: Secondary | ICD-10-CM | POA: Diagnosis not present

## 2022-12-17 DIAGNOSIS — N2581 Secondary hyperparathyroidism of renal origin: Secondary | ICD-10-CM | POA: Diagnosis not present

## 2022-12-17 DIAGNOSIS — E875 Hyperkalemia: Secondary | ICD-10-CM | POA: Diagnosis not present

## 2022-12-17 DIAGNOSIS — E1122 Type 2 diabetes mellitus with diabetic chronic kidney disease: Secondary | ICD-10-CM | POA: Diagnosis not present

## 2022-12-19 DIAGNOSIS — E875 Hyperkalemia: Secondary | ICD-10-CM | POA: Diagnosis not present

## 2022-12-19 DIAGNOSIS — D689 Coagulation defect, unspecified: Secondary | ICD-10-CM | POA: Diagnosis not present

## 2022-12-19 DIAGNOSIS — Z992 Dependence on renal dialysis: Secondary | ICD-10-CM | POA: Diagnosis not present

## 2022-12-19 DIAGNOSIS — N186 End stage renal disease: Secondary | ICD-10-CM | POA: Diagnosis not present

## 2022-12-19 DIAGNOSIS — N2581 Secondary hyperparathyroidism of renal origin: Secondary | ICD-10-CM | POA: Diagnosis not present

## 2022-12-19 DIAGNOSIS — D509 Iron deficiency anemia, unspecified: Secondary | ICD-10-CM | POA: Diagnosis not present

## 2022-12-19 DIAGNOSIS — D631 Anemia in chronic kidney disease: Secondary | ICD-10-CM | POA: Diagnosis not present

## 2022-12-19 DIAGNOSIS — E1122 Type 2 diabetes mellitus with diabetic chronic kidney disease: Secondary | ICD-10-CM | POA: Diagnosis not present

## 2022-12-21 DIAGNOSIS — Z992 Dependence on renal dialysis: Secondary | ICD-10-CM | POA: Diagnosis not present

## 2022-12-21 DIAGNOSIS — D689 Coagulation defect, unspecified: Secondary | ICD-10-CM | POA: Diagnosis not present

## 2022-12-21 DIAGNOSIS — N2581 Secondary hyperparathyroidism of renal origin: Secondary | ICD-10-CM | POA: Diagnosis not present

## 2022-12-21 DIAGNOSIS — D631 Anemia in chronic kidney disease: Secondary | ICD-10-CM | POA: Diagnosis not present

## 2022-12-21 DIAGNOSIS — E875 Hyperkalemia: Secondary | ICD-10-CM | POA: Diagnosis not present

## 2022-12-21 DIAGNOSIS — N186 End stage renal disease: Secondary | ICD-10-CM | POA: Diagnosis not present

## 2022-12-21 DIAGNOSIS — E1122 Type 2 diabetes mellitus with diabetic chronic kidney disease: Secondary | ICD-10-CM | POA: Diagnosis not present

## 2022-12-21 DIAGNOSIS — D509 Iron deficiency anemia, unspecified: Secondary | ICD-10-CM | POA: Diagnosis not present

## 2022-12-24 DIAGNOSIS — N186 End stage renal disease: Secondary | ICD-10-CM | POA: Diagnosis not present

## 2022-12-24 DIAGNOSIS — D509 Iron deficiency anemia, unspecified: Secondary | ICD-10-CM | POA: Diagnosis not present

## 2022-12-24 DIAGNOSIS — Z992 Dependence on renal dialysis: Secondary | ICD-10-CM | POA: Diagnosis not present

## 2022-12-24 DIAGNOSIS — E1122 Type 2 diabetes mellitus with diabetic chronic kidney disease: Secondary | ICD-10-CM | POA: Diagnosis not present

## 2022-12-24 DIAGNOSIS — D689 Coagulation defect, unspecified: Secondary | ICD-10-CM | POA: Diagnosis not present

## 2022-12-24 DIAGNOSIS — D631 Anemia in chronic kidney disease: Secondary | ICD-10-CM | POA: Diagnosis not present

## 2022-12-24 DIAGNOSIS — E875 Hyperkalemia: Secondary | ICD-10-CM | POA: Diagnosis not present

## 2022-12-24 DIAGNOSIS — N2581 Secondary hyperparathyroidism of renal origin: Secondary | ICD-10-CM | POA: Diagnosis not present

## 2022-12-26 DIAGNOSIS — D689 Coagulation defect, unspecified: Secondary | ICD-10-CM | POA: Diagnosis not present

## 2022-12-26 DIAGNOSIS — N032 Chronic nephritic syndrome with diffuse membranous glomerulonephritis: Secondary | ICD-10-CM | POA: Diagnosis not present

## 2022-12-26 DIAGNOSIS — N186 End stage renal disease: Secondary | ICD-10-CM | POA: Diagnosis not present

## 2022-12-26 DIAGNOSIS — D631 Anemia in chronic kidney disease: Secondary | ICD-10-CM | POA: Diagnosis not present

## 2022-12-26 DIAGNOSIS — E875 Hyperkalemia: Secondary | ICD-10-CM | POA: Diagnosis not present

## 2022-12-26 DIAGNOSIS — E1122 Type 2 diabetes mellitus with diabetic chronic kidney disease: Secondary | ICD-10-CM | POA: Diagnosis not present

## 2022-12-26 DIAGNOSIS — Z992 Dependence on renal dialysis: Secondary | ICD-10-CM | POA: Diagnosis not present

## 2022-12-26 DIAGNOSIS — N2581 Secondary hyperparathyroidism of renal origin: Secondary | ICD-10-CM | POA: Diagnosis not present

## 2022-12-26 DIAGNOSIS — D509 Iron deficiency anemia, unspecified: Secondary | ICD-10-CM | POA: Diagnosis not present

## 2022-12-28 DIAGNOSIS — D509 Iron deficiency anemia, unspecified: Secondary | ICD-10-CM | POA: Diagnosis not present

## 2022-12-28 DIAGNOSIS — Z992 Dependence on renal dialysis: Secondary | ICD-10-CM | POA: Diagnosis not present

## 2022-12-28 DIAGNOSIS — N186 End stage renal disease: Secondary | ICD-10-CM | POA: Diagnosis not present

## 2022-12-28 DIAGNOSIS — D689 Coagulation defect, unspecified: Secondary | ICD-10-CM | POA: Diagnosis not present

## 2022-12-28 DIAGNOSIS — D631 Anemia in chronic kidney disease: Secondary | ICD-10-CM | POA: Diagnosis not present

## 2022-12-28 DIAGNOSIS — E1122 Type 2 diabetes mellitus with diabetic chronic kidney disease: Secondary | ICD-10-CM | POA: Diagnosis not present

## 2022-12-28 DIAGNOSIS — E875 Hyperkalemia: Secondary | ICD-10-CM | POA: Diagnosis not present

## 2022-12-28 DIAGNOSIS — N2581 Secondary hyperparathyroidism of renal origin: Secondary | ICD-10-CM | POA: Diagnosis not present

## 2022-12-31 DIAGNOSIS — N186 End stage renal disease: Secondary | ICD-10-CM | POA: Diagnosis not present

## 2022-12-31 DIAGNOSIS — N2581 Secondary hyperparathyroidism of renal origin: Secondary | ICD-10-CM | POA: Diagnosis not present

## 2022-12-31 DIAGNOSIS — D509 Iron deficiency anemia, unspecified: Secondary | ICD-10-CM | POA: Diagnosis not present

## 2022-12-31 DIAGNOSIS — E1122 Type 2 diabetes mellitus with diabetic chronic kidney disease: Secondary | ICD-10-CM | POA: Diagnosis not present

## 2022-12-31 DIAGNOSIS — D631 Anemia in chronic kidney disease: Secondary | ICD-10-CM | POA: Diagnosis not present

## 2022-12-31 DIAGNOSIS — D689 Coagulation defect, unspecified: Secondary | ICD-10-CM | POA: Diagnosis not present

## 2022-12-31 DIAGNOSIS — Z992 Dependence on renal dialysis: Secondary | ICD-10-CM | POA: Diagnosis not present

## 2022-12-31 DIAGNOSIS — E875 Hyperkalemia: Secondary | ICD-10-CM | POA: Diagnosis not present

## 2023-01-02 DIAGNOSIS — D689 Coagulation defect, unspecified: Secondary | ICD-10-CM | POA: Diagnosis not present

## 2023-01-02 DIAGNOSIS — D509 Iron deficiency anemia, unspecified: Secondary | ICD-10-CM | POA: Diagnosis not present

## 2023-01-02 DIAGNOSIS — E875 Hyperkalemia: Secondary | ICD-10-CM | POA: Diagnosis not present

## 2023-01-02 DIAGNOSIS — D631 Anemia in chronic kidney disease: Secondary | ICD-10-CM | POA: Diagnosis not present

## 2023-01-02 DIAGNOSIS — E1122 Type 2 diabetes mellitus with diabetic chronic kidney disease: Secondary | ICD-10-CM | POA: Diagnosis not present

## 2023-01-02 DIAGNOSIS — N2581 Secondary hyperparathyroidism of renal origin: Secondary | ICD-10-CM | POA: Diagnosis not present

## 2023-01-02 DIAGNOSIS — Z992 Dependence on renal dialysis: Secondary | ICD-10-CM | POA: Diagnosis not present

## 2023-01-02 DIAGNOSIS — N186 End stage renal disease: Secondary | ICD-10-CM | POA: Diagnosis not present

## 2023-01-04 DIAGNOSIS — N2581 Secondary hyperparathyroidism of renal origin: Secondary | ICD-10-CM | POA: Diagnosis not present

## 2023-01-04 DIAGNOSIS — Z992 Dependence on renal dialysis: Secondary | ICD-10-CM | POA: Diagnosis not present

## 2023-01-04 DIAGNOSIS — D689 Coagulation defect, unspecified: Secondary | ICD-10-CM | POA: Diagnosis not present

## 2023-01-04 DIAGNOSIS — E1122 Type 2 diabetes mellitus with diabetic chronic kidney disease: Secondary | ICD-10-CM | POA: Diagnosis not present

## 2023-01-04 DIAGNOSIS — D509 Iron deficiency anemia, unspecified: Secondary | ICD-10-CM | POA: Diagnosis not present

## 2023-01-04 DIAGNOSIS — N186 End stage renal disease: Secondary | ICD-10-CM | POA: Diagnosis not present

## 2023-01-04 DIAGNOSIS — E875 Hyperkalemia: Secondary | ICD-10-CM | POA: Diagnosis not present

## 2023-01-04 DIAGNOSIS — D631 Anemia in chronic kidney disease: Secondary | ICD-10-CM | POA: Diagnosis not present

## 2023-01-07 DIAGNOSIS — Z992 Dependence on renal dialysis: Secondary | ICD-10-CM | POA: Diagnosis not present

## 2023-01-07 DIAGNOSIS — E875 Hyperkalemia: Secondary | ICD-10-CM | POA: Diagnosis not present

## 2023-01-07 DIAGNOSIS — N2581 Secondary hyperparathyroidism of renal origin: Secondary | ICD-10-CM | POA: Diagnosis not present

## 2023-01-07 DIAGNOSIS — E1122 Type 2 diabetes mellitus with diabetic chronic kidney disease: Secondary | ICD-10-CM | POA: Diagnosis not present

## 2023-01-07 DIAGNOSIS — D689 Coagulation defect, unspecified: Secondary | ICD-10-CM | POA: Diagnosis not present

## 2023-01-07 DIAGNOSIS — D631 Anemia in chronic kidney disease: Secondary | ICD-10-CM | POA: Diagnosis not present

## 2023-01-07 DIAGNOSIS — D509 Iron deficiency anemia, unspecified: Secondary | ICD-10-CM | POA: Diagnosis not present

## 2023-01-07 DIAGNOSIS — N186 End stage renal disease: Secondary | ICD-10-CM | POA: Diagnosis not present

## 2023-01-09 ENCOUNTER — Ambulatory Visit (INDEPENDENT_AMBULATORY_CARE_PROVIDER_SITE_OTHER): Payer: 59

## 2023-01-09 DIAGNOSIS — E875 Hyperkalemia: Secondary | ICD-10-CM | POA: Diagnosis not present

## 2023-01-09 DIAGNOSIS — D631 Anemia in chronic kidney disease: Secondary | ICD-10-CM | POA: Diagnosis not present

## 2023-01-09 DIAGNOSIS — I255 Ischemic cardiomyopathy: Secondary | ICD-10-CM | POA: Diagnosis not present

## 2023-01-09 DIAGNOSIS — N186 End stage renal disease: Secondary | ICD-10-CM | POA: Diagnosis not present

## 2023-01-09 DIAGNOSIS — N2581 Secondary hyperparathyroidism of renal origin: Secondary | ICD-10-CM | POA: Diagnosis not present

## 2023-01-09 DIAGNOSIS — D689 Coagulation defect, unspecified: Secondary | ICD-10-CM | POA: Diagnosis not present

## 2023-01-09 DIAGNOSIS — D509 Iron deficiency anemia, unspecified: Secondary | ICD-10-CM | POA: Diagnosis not present

## 2023-01-09 DIAGNOSIS — Z992 Dependence on renal dialysis: Secondary | ICD-10-CM | POA: Diagnosis not present

## 2023-01-09 DIAGNOSIS — E1122 Type 2 diabetes mellitus with diabetic chronic kidney disease: Secondary | ICD-10-CM | POA: Diagnosis not present

## 2023-01-10 LAB — CUP PACEART REMOTE DEVICE CHECK
Battery Remaining Longevity: 57 mo
Battery Voltage: 2.97 V
Brady Statistic RV Percent Paced: 0.01 %
Date Time Interrogation Session: 20240314022726
HighPow Impedance: 254 Ohm
HighPow Impedance: 59 Ohm
Implantable Lead Connection Status: 753985
Implantable Lead Implant Date: 20210315
Implantable Lead Location: 753860
Implantable Lead Model: 6935
Implantable Pulse Generator Implant Date: 20190208
Lead Channel Impedance Value: 304 Ohm
Lead Channel Impedance Value: 418 Ohm
Lead Channel Pacing Threshold Amplitude: 0.5 V
Lead Channel Pacing Threshold Pulse Width: 0.4 ms
Lead Channel Sensing Intrinsic Amplitude: 10 mV
Lead Channel Sensing Intrinsic Amplitude: 10 mV
Lead Channel Setting Pacing Amplitude: 2 V
Lead Channel Setting Pacing Pulse Width: 0.4 ms
Lead Channel Setting Sensing Sensitivity: 0.3 mV
Zone Setting Status: 755011

## 2023-01-11 DIAGNOSIS — D509 Iron deficiency anemia, unspecified: Secondary | ICD-10-CM | POA: Diagnosis not present

## 2023-01-11 DIAGNOSIS — D631 Anemia in chronic kidney disease: Secondary | ICD-10-CM | POA: Diagnosis not present

## 2023-01-11 DIAGNOSIS — E875 Hyperkalemia: Secondary | ICD-10-CM | POA: Diagnosis not present

## 2023-01-11 DIAGNOSIS — Z992 Dependence on renal dialysis: Secondary | ICD-10-CM | POA: Diagnosis not present

## 2023-01-11 DIAGNOSIS — N2581 Secondary hyperparathyroidism of renal origin: Secondary | ICD-10-CM | POA: Diagnosis not present

## 2023-01-11 DIAGNOSIS — D689 Coagulation defect, unspecified: Secondary | ICD-10-CM | POA: Diagnosis not present

## 2023-01-11 DIAGNOSIS — E1122 Type 2 diabetes mellitus with diabetic chronic kidney disease: Secondary | ICD-10-CM | POA: Diagnosis not present

## 2023-01-11 DIAGNOSIS — N186 End stage renal disease: Secondary | ICD-10-CM | POA: Diagnosis not present

## 2023-01-14 DIAGNOSIS — D509 Iron deficiency anemia, unspecified: Secondary | ICD-10-CM | POA: Diagnosis not present

## 2023-01-14 DIAGNOSIS — E1122 Type 2 diabetes mellitus with diabetic chronic kidney disease: Secondary | ICD-10-CM | POA: Diagnosis not present

## 2023-01-14 DIAGNOSIS — D631 Anemia in chronic kidney disease: Secondary | ICD-10-CM | POA: Diagnosis not present

## 2023-01-14 DIAGNOSIS — Z992 Dependence on renal dialysis: Secondary | ICD-10-CM | POA: Diagnosis not present

## 2023-01-14 DIAGNOSIS — E875 Hyperkalemia: Secondary | ICD-10-CM | POA: Diagnosis not present

## 2023-01-14 DIAGNOSIS — D689 Coagulation defect, unspecified: Secondary | ICD-10-CM | POA: Diagnosis not present

## 2023-01-14 DIAGNOSIS — N2581 Secondary hyperparathyroidism of renal origin: Secondary | ICD-10-CM | POA: Diagnosis not present

## 2023-01-14 DIAGNOSIS — N186 End stage renal disease: Secondary | ICD-10-CM | POA: Diagnosis not present

## 2023-01-16 DIAGNOSIS — D631 Anemia in chronic kidney disease: Secondary | ICD-10-CM | POA: Diagnosis not present

## 2023-01-16 DIAGNOSIS — N186 End stage renal disease: Secondary | ICD-10-CM | POA: Diagnosis not present

## 2023-01-16 DIAGNOSIS — D689 Coagulation defect, unspecified: Secondary | ICD-10-CM | POA: Diagnosis not present

## 2023-01-16 DIAGNOSIS — D509 Iron deficiency anemia, unspecified: Secondary | ICD-10-CM | POA: Diagnosis not present

## 2023-01-16 DIAGNOSIS — N2581 Secondary hyperparathyroidism of renal origin: Secondary | ICD-10-CM | POA: Diagnosis not present

## 2023-01-16 DIAGNOSIS — E1122 Type 2 diabetes mellitus with diabetic chronic kidney disease: Secondary | ICD-10-CM | POA: Diagnosis not present

## 2023-01-16 DIAGNOSIS — Z992 Dependence on renal dialysis: Secondary | ICD-10-CM | POA: Diagnosis not present

## 2023-01-16 DIAGNOSIS — E875 Hyperkalemia: Secondary | ICD-10-CM | POA: Diagnosis not present

## 2023-01-18 DIAGNOSIS — D509 Iron deficiency anemia, unspecified: Secondary | ICD-10-CM | POA: Diagnosis not present

## 2023-01-18 DIAGNOSIS — E875 Hyperkalemia: Secondary | ICD-10-CM | POA: Diagnosis not present

## 2023-01-18 DIAGNOSIS — N2581 Secondary hyperparathyroidism of renal origin: Secondary | ICD-10-CM | POA: Diagnosis not present

## 2023-01-18 DIAGNOSIS — N186 End stage renal disease: Secondary | ICD-10-CM | POA: Diagnosis not present

## 2023-01-18 DIAGNOSIS — E1122 Type 2 diabetes mellitus with diabetic chronic kidney disease: Secondary | ICD-10-CM | POA: Diagnosis not present

## 2023-01-18 DIAGNOSIS — D631 Anemia in chronic kidney disease: Secondary | ICD-10-CM | POA: Diagnosis not present

## 2023-01-18 DIAGNOSIS — Z992 Dependence on renal dialysis: Secondary | ICD-10-CM | POA: Diagnosis not present

## 2023-01-18 DIAGNOSIS — D689 Coagulation defect, unspecified: Secondary | ICD-10-CM | POA: Diagnosis not present

## 2023-01-21 DIAGNOSIS — Z992 Dependence on renal dialysis: Secondary | ICD-10-CM | POA: Diagnosis not present

## 2023-01-21 DIAGNOSIS — D509 Iron deficiency anemia, unspecified: Secondary | ICD-10-CM | POA: Diagnosis not present

## 2023-01-21 DIAGNOSIS — D631 Anemia in chronic kidney disease: Secondary | ICD-10-CM | POA: Diagnosis not present

## 2023-01-21 DIAGNOSIS — D689 Coagulation defect, unspecified: Secondary | ICD-10-CM | POA: Diagnosis not present

## 2023-01-21 DIAGNOSIS — E875 Hyperkalemia: Secondary | ICD-10-CM | POA: Diagnosis not present

## 2023-01-21 DIAGNOSIS — E1122 Type 2 diabetes mellitus with diabetic chronic kidney disease: Secondary | ICD-10-CM | POA: Diagnosis not present

## 2023-01-21 DIAGNOSIS — N186 End stage renal disease: Secondary | ICD-10-CM | POA: Diagnosis not present

## 2023-01-21 DIAGNOSIS — N2581 Secondary hyperparathyroidism of renal origin: Secondary | ICD-10-CM | POA: Diagnosis not present

## 2023-01-23 DIAGNOSIS — N186 End stage renal disease: Secondary | ICD-10-CM | POA: Diagnosis not present

## 2023-01-23 DIAGNOSIS — N2581 Secondary hyperparathyroidism of renal origin: Secondary | ICD-10-CM | POA: Diagnosis not present

## 2023-01-23 DIAGNOSIS — E875 Hyperkalemia: Secondary | ICD-10-CM | POA: Diagnosis not present

## 2023-01-23 DIAGNOSIS — Z992 Dependence on renal dialysis: Secondary | ICD-10-CM | POA: Diagnosis not present

## 2023-01-23 DIAGNOSIS — D689 Coagulation defect, unspecified: Secondary | ICD-10-CM | POA: Diagnosis not present

## 2023-01-23 DIAGNOSIS — D509 Iron deficiency anemia, unspecified: Secondary | ICD-10-CM | POA: Diagnosis not present

## 2023-01-23 DIAGNOSIS — E1122 Type 2 diabetes mellitus with diabetic chronic kidney disease: Secondary | ICD-10-CM | POA: Diagnosis not present

## 2023-01-23 DIAGNOSIS — D631 Anemia in chronic kidney disease: Secondary | ICD-10-CM | POA: Diagnosis not present

## 2023-01-25 DIAGNOSIS — D509 Iron deficiency anemia, unspecified: Secondary | ICD-10-CM | POA: Diagnosis not present

## 2023-01-25 DIAGNOSIS — E875 Hyperkalemia: Secondary | ICD-10-CM | POA: Diagnosis not present

## 2023-01-25 DIAGNOSIS — N2581 Secondary hyperparathyroidism of renal origin: Secondary | ICD-10-CM | POA: Diagnosis not present

## 2023-01-25 DIAGNOSIS — D689 Coagulation defect, unspecified: Secondary | ICD-10-CM | POA: Diagnosis not present

## 2023-01-25 DIAGNOSIS — D631 Anemia in chronic kidney disease: Secondary | ICD-10-CM | POA: Diagnosis not present

## 2023-01-25 DIAGNOSIS — Z992 Dependence on renal dialysis: Secondary | ICD-10-CM | POA: Diagnosis not present

## 2023-01-25 DIAGNOSIS — E1122 Type 2 diabetes mellitus with diabetic chronic kidney disease: Secondary | ICD-10-CM | POA: Diagnosis not present

## 2023-01-25 DIAGNOSIS — N186 End stage renal disease: Secondary | ICD-10-CM | POA: Diagnosis not present

## 2023-01-26 DIAGNOSIS — Z992 Dependence on renal dialysis: Secondary | ICD-10-CM | POA: Diagnosis not present

## 2023-01-26 DIAGNOSIS — N186 End stage renal disease: Secondary | ICD-10-CM | POA: Diagnosis not present

## 2023-01-26 DIAGNOSIS — N032 Chronic nephritic syndrome with diffuse membranous glomerulonephritis: Secondary | ICD-10-CM | POA: Diagnosis not present

## 2023-01-28 DIAGNOSIS — E875 Hyperkalemia: Secondary | ICD-10-CM | POA: Diagnosis not present

## 2023-01-28 DIAGNOSIS — E1122 Type 2 diabetes mellitus with diabetic chronic kidney disease: Secondary | ICD-10-CM | POA: Diagnosis not present

## 2023-01-28 DIAGNOSIS — N2581 Secondary hyperparathyroidism of renal origin: Secondary | ICD-10-CM | POA: Diagnosis not present

## 2023-01-28 DIAGNOSIS — D689 Coagulation defect, unspecified: Secondary | ICD-10-CM | POA: Diagnosis not present

## 2023-01-28 DIAGNOSIS — Z992 Dependence on renal dialysis: Secondary | ICD-10-CM | POA: Diagnosis not present

## 2023-01-28 DIAGNOSIS — N186 End stage renal disease: Secondary | ICD-10-CM | POA: Diagnosis not present

## 2023-01-28 DIAGNOSIS — D509 Iron deficiency anemia, unspecified: Secondary | ICD-10-CM | POA: Diagnosis not present

## 2023-01-28 DIAGNOSIS — D631 Anemia in chronic kidney disease: Secondary | ICD-10-CM | POA: Diagnosis not present

## 2023-01-30 DIAGNOSIS — D631 Anemia in chronic kidney disease: Secondary | ICD-10-CM | POA: Diagnosis not present

## 2023-01-30 DIAGNOSIS — E1122 Type 2 diabetes mellitus with diabetic chronic kidney disease: Secondary | ICD-10-CM | POA: Diagnosis not present

## 2023-01-30 DIAGNOSIS — N186 End stage renal disease: Secondary | ICD-10-CM | POA: Diagnosis not present

## 2023-01-30 DIAGNOSIS — D509 Iron deficiency anemia, unspecified: Secondary | ICD-10-CM | POA: Diagnosis not present

## 2023-01-30 DIAGNOSIS — D689 Coagulation defect, unspecified: Secondary | ICD-10-CM | POA: Diagnosis not present

## 2023-01-30 DIAGNOSIS — E875 Hyperkalemia: Secondary | ICD-10-CM | POA: Diagnosis not present

## 2023-01-30 DIAGNOSIS — N2581 Secondary hyperparathyroidism of renal origin: Secondary | ICD-10-CM | POA: Diagnosis not present

## 2023-01-30 DIAGNOSIS — Z992 Dependence on renal dialysis: Secondary | ICD-10-CM | POA: Diagnosis not present

## 2023-02-01 DIAGNOSIS — E1122 Type 2 diabetes mellitus with diabetic chronic kidney disease: Secondary | ICD-10-CM | POA: Diagnosis not present

## 2023-02-01 DIAGNOSIS — Z992 Dependence on renal dialysis: Secondary | ICD-10-CM | POA: Diagnosis not present

## 2023-02-01 DIAGNOSIS — N186 End stage renal disease: Secondary | ICD-10-CM | POA: Diagnosis not present

## 2023-02-01 DIAGNOSIS — E875 Hyperkalemia: Secondary | ICD-10-CM | POA: Diagnosis not present

## 2023-02-01 DIAGNOSIS — N2581 Secondary hyperparathyroidism of renal origin: Secondary | ICD-10-CM | POA: Diagnosis not present

## 2023-02-01 DIAGNOSIS — D631 Anemia in chronic kidney disease: Secondary | ICD-10-CM | POA: Diagnosis not present

## 2023-02-01 DIAGNOSIS — D509 Iron deficiency anemia, unspecified: Secondary | ICD-10-CM | POA: Diagnosis not present

## 2023-02-01 DIAGNOSIS — D689 Coagulation defect, unspecified: Secondary | ICD-10-CM | POA: Diagnosis not present

## 2023-02-04 DIAGNOSIS — D509 Iron deficiency anemia, unspecified: Secondary | ICD-10-CM | POA: Diagnosis not present

## 2023-02-04 DIAGNOSIS — E875 Hyperkalemia: Secondary | ICD-10-CM | POA: Diagnosis not present

## 2023-02-04 DIAGNOSIS — N186 End stage renal disease: Secondary | ICD-10-CM | POA: Diagnosis not present

## 2023-02-04 DIAGNOSIS — Z992 Dependence on renal dialysis: Secondary | ICD-10-CM | POA: Diagnosis not present

## 2023-02-04 DIAGNOSIS — E1122 Type 2 diabetes mellitus with diabetic chronic kidney disease: Secondary | ICD-10-CM | POA: Diagnosis not present

## 2023-02-04 DIAGNOSIS — N2581 Secondary hyperparathyroidism of renal origin: Secondary | ICD-10-CM | POA: Diagnosis not present

## 2023-02-04 DIAGNOSIS — D689 Coagulation defect, unspecified: Secondary | ICD-10-CM | POA: Diagnosis not present

## 2023-02-04 DIAGNOSIS — D631 Anemia in chronic kidney disease: Secondary | ICD-10-CM | POA: Diagnosis not present

## 2023-02-06 DIAGNOSIS — D631 Anemia in chronic kidney disease: Secondary | ICD-10-CM | POA: Diagnosis not present

## 2023-02-06 DIAGNOSIS — D689 Coagulation defect, unspecified: Secondary | ICD-10-CM | POA: Diagnosis not present

## 2023-02-06 DIAGNOSIS — E875 Hyperkalemia: Secondary | ICD-10-CM | POA: Diagnosis not present

## 2023-02-06 DIAGNOSIS — N2581 Secondary hyperparathyroidism of renal origin: Secondary | ICD-10-CM | POA: Diagnosis not present

## 2023-02-06 DIAGNOSIS — D509 Iron deficiency anemia, unspecified: Secondary | ICD-10-CM | POA: Diagnosis not present

## 2023-02-06 DIAGNOSIS — E1122 Type 2 diabetes mellitus with diabetic chronic kidney disease: Secondary | ICD-10-CM | POA: Diagnosis not present

## 2023-02-06 DIAGNOSIS — Z992 Dependence on renal dialysis: Secondary | ICD-10-CM | POA: Diagnosis not present

## 2023-02-06 DIAGNOSIS — N186 End stage renal disease: Secondary | ICD-10-CM | POA: Diagnosis not present

## 2023-02-08 DIAGNOSIS — N186 End stage renal disease: Secondary | ICD-10-CM | POA: Diagnosis not present

## 2023-02-08 DIAGNOSIS — D509 Iron deficiency anemia, unspecified: Secondary | ICD-10-CM | POA: Diagnosis not present

## 2023-02-08 DIAGNOSIS — D689 Coagulation defect, unspecified: Secondary | ICD-10-CM | POA: Diagnosis not present

## 2023-02-08 DIAGNOSIS — D631 Anemia in chronic kidney disease: Secondary | ICD-10-CM | POA: Diagnosis not present

## 2023-02-08 DIAGNOSIS — N2581 Secondary hyperparathyroidism of renal origin: Secondary | ICD-10-CM | POA: Diagnosis not present

## 2023-02-08 DIAGNOSIS — E1122 Type 2 diabetes mellitus with diabetic chronic kidney disease: Secondary | ICD-10-CM | POA: Diagnosis not present

## 2023-02-08 DIAGNOSIS — Z992 Dependence on renal dialysis: Secondary | ICD-10-CM | POA: Diagnosis not present

## 2023-02-08 DIAGNOSIS — E875 Hyperkalemia: Secondary | ICD-10-CM | POA: Diagnosis not present

## 2023-02-11 DIAGNOSIS — Z992 Dependence on renal dialysis: Secondary | ICD-10-CM | POA: Diagnosis not present

## 2023-02-11 DIAGNOSIS — D509 Iron deficiency anemia, unspecified: Secondary | ICD-10-CM | POA: Diagnosis not present

## 2023-02-11 DIAGNOSIS — E1122 Type 2 diabetes mellitus with diabetic chronic kidney disease: Secondary | ICD-10-CM | POA: Diagnosis not present

## 2023-02-11 DIAGNOSIS — E875 Hyperkalemia: Secondary | ICD-10-CM | POA: Diagnosis not present

## 2023-02-11 DIAGNOSIS — D631 Anemia in chronic kidney disease: Secondary | ICD-10-CM | POA: Diagnosis not present

## 2023-02-11 DIAGNOSIS — N186 End stage renal disease: Secondary | ICD-10-CM | POA: Diagnosis not present

## 2023-02-11 DIAGNOSIS — D689 Coagulation defect, unspecified: Secondary | ICD-10-CM | POA: Diagnosis not present

## 2023-02-11 DIAGNOSIS — N2581 Secondary hyperparathyroidism of renal origin: Secondary | ICD-10-CM | POA: Diagnosis not present

## 2023-02-11 NOTE — Progress Notes (Signed)
Remote ICD transmission.   

## 2023-02-13 DIAGNOSIS — D509 Iron deficiency anemia, unspecified: Secondary | ICD-10-CM | POA: Diagnosis not present

## 2023-02-13 DIAGNOSIS — D631 Anemia in chronic kidney disease: Secondary | ICD-10-CM | POA: Diagnosis not present

## 2023-02-13 DIAGNOSIS — E875 Hyperkalemia: Secondary | ICD-10-CM | POA: Diagnosis not present

## 2023-02-13 DIAGNOSIS — N2581 Secondary hyperparathyroidism of renal origin: Secondary | ICD-10-CM | POA: Diagnosis not present

## 2023-02-13 DIAGNOSIS — E1122 Type 2 diabetes mellitus with diabetic chronic kidney disease: Secondary | ICD-10-CM | POA: Diagnosis not present

## 2023-02-13 DIAGNOSIS — D689 Coagulation defect, unspecified: Secondary | ICD-10-CM | POA: Diagnosis not present

## 2023-02-13 DIAGNOSIS — N186 End stage renal disease: Secondary | ICD-10-CM | POA: Diagnosis not present

## 2023-02-13 DIAGNOSIS — Z992 Dependence on renal dialysis: Secondary | ICD-10-CM | POA: Diagnosis not present

## 2023-02-15 DIAGNOSIS — E875 Hyperkalemia: Secondary | ICD-10-CM | POA: Diagnosis not present

## 2023-02-15 DIAGNOSIS — N2581 Secondary hyperparathyroidism of renal origin: Secondary | ICD-10-CM | POA: Diagnosis not present

## 2023-02-15 DIAGNOSIS — N186 End stage renal disease: Secondary | ICD-10-CM | POA: Diagnosis not present

## 2023-02-15 DIAGNOSIS — D509 Iron deficiency anemia, unspecified: Secondary | ICD-10-CM | POA: Diagnosis not present

## 2023-02-15 DIAGNOSIS — Z992 Dependence on renal dialysis: Secondary | ICD-10-CM | POA: Diagnosis not present

## 2023-02-15 DIAGNOSIS — D631 Anemia in chronic kidney disease: Secondary | ICD-10-CM | POA: Diagnosis not present

## 2023-02-15 DIAGNOSIS — E1122 Type 2 diabetes mellitus with diabetic chronic kidney disease: Secondary | ICD-10-CM | POA: Diagnosis not present

## 2023-02-15 DIAGNOSIS — D689 Coagulation defect, unspecified: Secondary | ICD-10-CM | POA: Diagnosis not present

## 2023-02-18 DIAGNOSIS — E875 Hyperkalemia: Secondary | ICD-10-CM | POA: Diagnosis not present

## 2023-02-18 DIAGNOSIS — D689 Coagulation defect, unspecified: Secondary | ICD-10-CM | POA: Diagnosis not present

## 2023-02-18 DIAGNOSIS — D509 Iron deficiency anemia, unspecified: Secondary | ICD-10-CM | POA: Diagnosis not present

## 2023-02-18 DIAGNOSIS — Z992 Dependence on renal dialysis: Secondary | ICD-10-CM | POA: Diagnosis not present

## 2023-02-18 DIAGNOSIS — E1122 Type 2 diabetes mellitus with diabetic chronic kidney disease: Secondary | ICD-10-CM | POA: Diagnosis not present

## 2023-02-18 DIAGNOSIS — D631 Anemia in chronic kidney disease: Secondary | ICD-10-CM | POA: Diagnosis not present

## 2023-02-18 DIAGNOSIS — N186 End stage renal disease: Secondary | ICD-10-CM | POA: Diagnosis not present

## 2023-02-18 DIAGNOSIS — N2581 Secondary hyperparathyroidism of renal origin: Secondary | ICD-10-CM | POA: Diagnosis not present

## 2023-02-20 DIAGNOSIS — D689 Coagulation defect, unspecified: Secondary | ICD-10-CM | POA: Diagnosis not present

## 2023-02-20 DIAGNOSIS — N2581 Secondary hyperparathyroidism of renal origin: Secondary | ICD-10-CM | POA: Diagnosis not present

## 2023-02-20 DIAGNOSIS — E1122 Type 2 diabetes mellitus with diabetic chronic kidney disease: Secondary | ICD-10-CM | POA: Diagnosis not present

## 2023-02-20 DIAGNOSIS — D509 Iron deficiency anemia, unspecified: Secondary | ICD-10-CM | POA: Diagnosis not present

## 2023-02-20 DIAGNOSIS — N186 End stage renal disease: Secondary | ICD-10-CM | POA: Diagnosis not present

## 2023-02-20 DIAGNOSIS — D631 Anemia in chronic kidney disease: Secondary | ICD-10-CM | POA: Diagnosis not present

## 2023-02-20 DIAGNOSIS — Z992 Dependence on renal dialysis: Secondary | ICD-10-CM | POA: Diagnosis not present

## 2023-02-20 DIAGNOSIS — E875 Hyperkalemia: Secondary | ICD-10-CM | POA: Diagnosis not present

## 2023-02-22 DIAGNOSIS — Z992 Dependence on renal dialysis: Secondary | ICD-10-CM | POA: Diagnosis not present

## 2023-02-22 DIAGNOSIS — N2581 Secondary hyperparathyroidism of renal origin: Secondary | ICD-10-CM | POA: Diagnosis not present

## 2023-02-22 DIAGNOSIS — D631 Anemia in chronic kidney disease: Secondary | ICD-10-CM | POA: Diagnosis not present

## 2023-02-22 DIAGNOSIS — E1122 Type 2 diabetes mellitus with diabetic chronic kidney disease: Secondary | ICD-10-CM | POA: Diagnosis not present

## 2023-02-22 DIAGNOSIS — D509 Iron deficiency anemia, unspecified: Secondary | ICD-10-CM | POA: Diagnosis not present

## 2023-02-22 DIAGNOSIS — E875 Hyperkalemia: Secondary | ICD-10-CM | POA: Diagnosis not present

## 2023-02-22 DIAGNOSIS — D689 Coagulation defect, unspecified: Secondary | ICD-10-CM | POA: Diagnosis not present

## 2023-02-22 DIAGNOSIS — N186 End stage renal disease: Secondary | ICD-10-CM | POA: Diagnosis not present

## 2023-02-25 DIAGNOSIS — Z992 Dependence on renal dialysis: Secondary | ICD-10-CM | POA: Diagnosis not present

## 2023-02-25 DIAGNOSIS — N186 End stage renal disease: Secondary | ICD-10-CM | POA: Diagnosis not present

## 2023-02-25 DIAGNOSIS — D631 Anemia in chronic kidney disease: Secondary | ICD-10-CM | POA: Diagnosis not present

## 2023-02-25 DIAGNOSIS — N2581 Secondary hyperparathyroidism of renal origin: Secondary | ICD-10-CM | POA: Diagnosis not present

## 2023-02-25 DIAGNOSIS — E875 Hyperkalemia: Secondary | ICD-10-CM | POA: Diagnosis not present

## 2023-02-25 DIAGNOSIS — D509 Iron deficiency anemia, unspecified: Secondary | ICD-10-CM | POA: Diagnosis not present

## 2023-02-25 DIAGNOSIS — E1122 Type 2 diabetes mellitus with diabetic chronic kidney disease: Secondary | ICD-10-CM | POA: Diagnosis not present

## 2023-02-25 DIAGNOSIS — D689 Coagulation defect, unspecified: Secondary | ICD-10-CM | POA: Diagnosis not present

## 2023-02-25 DIAGNOSIS — N032 Chronic nephritic syndrome with diffuse membranous glomerulonephritis: Secondary | ICD-10-CM | POA: Diagnosis not present

## 2023-02-27 DIAGNOSIS — N186 End stage renal disease: Secondary | ICD-10-CM | POA: Diagnosis not present

## 2023-02-27 DIAGNOSIS — N2581 Secondary hyperparathyroidism of renal origin: Secondary | ICD-10-CM | POA: Diagnosis not present

## 2023-02-27 DIAGNOSIS — D509 Iron deficiency anemia, unspecified: Secondary | ICD-10-CM | POA: Diagnosis not present

## 2023-02-27 DIAGNOSIS — E1122 Type 2 diabetes mellitus with diabetic chronic kidney disease: Secondary | ICD-10-CM | POA: Diagnosis not present

## 2023-02-27 DIAGNOSIS — Z992 Dependence on renal dialysis: Secondary | ICD-10-CM | POA: Diagnosis not present

## 2023-02-27 DIAGNOSIS — D689 Coagulation defect, unspecified: Secondary | ICD-10-CM | POA: Diagnosis not present

## 2023-02-27 DIAGNOSIS — D631 Anemia in chronic kidney disease: Secondary | ICD-10-CM | POA: Diagnosis not present

## 2023-02-27 DIAGNOSIS — E875 Hyperkalemia: Secondary | ICD-10-CM | POA: Diagnosis not present

## 2023-03-01 DIAGNOSIS — N2581 Secondary hyperparathyroidism of renal origin: Secondary | ICD-10-CM | POA: Diagnosis not present

## 2023-03-01 DIAGNOSIS — Z992 Dependence on renal dialysis: Secondary | ICD-10-CM | POA: Diagnosis not present

## 2023-03-01 DIAGNOSIS — E875 Hyperkalemia: Secondary | ICD-10-CM | POA: Diagnosis not present

## 2023-03-01 DIAGNOSIS — D509 Iron deficiency anemia, unspecified: Secondary | ICD-10-CM | POA: Diagnosis not present

## 2023-03-01 DIAGNOSIS — N186 End stage renal disease: Secondary | ICD-10-CM | POA: Diagnosis not present

## 2023-03-01 DIAGNOSIS — D689 Coagulation defect, unspecified: Secondary | ICD-10-CM | POA: Diagnosis not present

## 2023-03-01 DIAGNOSIS — E1122 Type 2 diabetes mellitus with diabetic chronic kidney disease: Secondary | ICD-10-CM | POA: Diagnosis not present

## 2023-03-01 DIAGNOSIS — D631 Anemia in chronic kidney disease: Secondary | ICD-10-CM | POA: Diagnosis not present

## 2023-03-04 DIAGNOSIS — D509 Iron deficiency anemia, unspecified: Secondary | ICD-10-CM | POA: Diagnosis not present

## 2023-03-04 DIAGNOSIS — D631 Anemia in chronic kidney disease: Secondary | ICD-10-CM | POA: Diagnosis not present

## 2023-03-04 DIAGNOSIS — D689 Coagulation defect, unspecified: Secondary | ICD-10-CM | POA: Diagnosis not present

## 2023-03-04 DIAGNOSIS — Z992 Dependence on renal dialysis: Secondary | ICD-10-CM | POA: Diagnosis not present

## 2023-03-04 DIAGNOSIS — E875 Hyperkalemia: Secondary | ICD-10-CM | POA: Diagnosis not present

## 2023-03-04 DIAGNOSIS — N186 End stage renal disease: Secondary | ICD-10-CM | POA: Diagnosis not present

## 2023-03-04 DIAGNOSIS — E1122 Type 2 diabetes mellitus with diabetic chronic kidney disease: Secondary | ICD-10-CM | POA: Diagnosis not present

## 2023-03-04 DIAGNOSIS — N2581 Secondary hyperparathyroidism of renal origin: Secondary | ICD-10-CM | POA: Diagnosis not present

## 2023-03-06 DIAGNOSIS — Z992 Dependence on renal dialysis: Secondary | ICD-10-CM | POA: Diagnosis not present

## 2023-03-06 DIAGNOSIS — E1122 Type 2 diabetes mellitus with diabetic chronic kidney disease: Secondary | ICD-10-CM | POA: Diagnosis not present

## 2023-03-06 DIAGNOSIS — D509 Iron deficiency anemia, unspecified: Secondary | ICD-10-CM | POA: Diagnosis not present

## 2023-03-06 DIAGNOSIS — D631 Anemia in chronic kidney disease: Secondary | ICD-10-CM | POA: Diagnosis not present

## 2023-03-06 DIAGNOSIS — N186 End stage renal disease: Secondary | ICD-10-CM | POA: Diagnosis not present

## 2023-03-06 DIAGNOSIS — D689 Coagulation defect, unspecified: Secondary | ICD-10-CM | POA: Diagnosis not present

## 2023-03-06 DIAGNOSIS — N2581 Secondary hyperparathyroidism of renal origin: Secondary | ICD-10-CM | POA: Diagnosis not present

## 2023-03-06 DIAGNOSIS — E875 Hyperkalemia: Secondary | ICD-10-CM | POA: Diagnosis not present

## 2023-03-08 DIAGNOSIS — D509 Iron deficiency anemia, unspecified: Secondary | ICD-10-CM | POA: Diagnosis not present

## 2023-03-08 DIAGNOSIS — E875 Hyperkalemia: Secondary | ICD-10-CM | POA: Diagnosis not present

## 2023-03-08 DIAGNOSIS — D631 Anemia in chronic kidney disease: Secondary | ICD-10-CM | POA: Diagnosis not present

## 2023-03-08 DIAGNOSIS — N186 End stage renal disease: Secondary | ICD-10-CM | POA: Diagnosis not present

## 2023-03-08 DIAGNOSIS — E1122 Type 2 diabetes mellitus with diabetic chronic kidney disease: Secondary | ICD-10-CM | POA: Diagnosis not present

## 2023-03-08 DIAGNOSIS — N2581 Secondary hyperparathyroidism of renal origin: Secondary | ICD-10-CM | POA: Diagnosis not present

## 2023-03-08 DIAGNOSIS — Z992 Dependence on renal dialysis: Secondary | ICD-10-CM | POA: Diagnosis not present

## 2023-03-08 DIAGNOSIS — D689 Coagulation defect, unspecified: Secondary | ICD-10-CM | POA: Diagnosis not present

## 2023-03-11 DIAGNOSIS — N2581 Secondary hyperparathyroidism of renal origin: Secondary | ICD-10-CM | POA: Diagnosis not present

## 2023-03-11 DIAGNOSIS — D509 Iron deficiency anemia, unspecified: Secondary | ICD-10-CM | POA: Diagnosis not present

## 2023-03-11 DIAGNOSIS — N186 End stage renal disease: Secondary | ICD-10-CM | POA: Diagnosis not present

## 2023-03-11 DIAGNOSIS — E875 Hyperkalemia: Secondary | ICD-10-CM | POA: Diagnosis not present

## 2023-03-11 DIAGNOSIS — D631 Anemia in chronic kidney disease: Secondary | ICD-10-CM | POA: Diagnosis not present

## 2023-03-11 DIAGNOSIS — E1122 Type 2 diabetes mellitus with diabetic chronic kidney disease: Secondary | ICD-10-CM | POA: Diagnosis not present

## 2023-03-11 DIAGNOSIS — D689 Coagulation defect, unspecified: Secondary | ICD-10-CM | POA: Diagnosis not present

## 2023-03-11 DIAGNOSIS — Z992 Dependence on renal dialysis: Secondary | ICD-10-CM | POA: Diagnosis not present

## 2023-03-13 DIAGNOSIS — N2581 Secondary hyperparathyroidism of renal origin: Secondary | ICD-10-CM | POA: Diagnosis not present

## 2023-03-13 DIAGNOSIS — E875 Hyperkalemia: Secondary | ICD-10-CM | POA: Diagnosis not present

## 2023-03-13 DIAGNOSIS — D631 Anemia in chronic kidney disease: Secondary | ICD-10-CM | POA: Diagnosis not present

## 2023-03-13 DIAGNOSIS — Z992 Dependence on renal dialysis: Secondary | ICD-10-CM | POA: Diagnosis not present

## 2023-03-13 DIAGNOSIS — D509 Iron deficiency anemia, unspecified: Secondary | ICD-10-CM | POA: Diagnosis not present

## 2023-03-13 DIAGNOSIS — E1122 Type 2 diabetes mellitus with diabetic chronic kidney disease: Secondary | ICD-10-CM | POA: Diagnosis not present

## 2023-03-13 DIAGNOSIS — D689 Coagulation defect, unspecified: Secondary | ICD-10-CM | POA: Diagnosis not present

## 2023-03-13 DIAGNOSIS — N186 End stage renal disease: Secondary | ICD-10-CM | POA: Diagnosis not present

## 2023-03-15 DIAGNOSIS — D509 Iron deficiency anemia, unspecified: Secondary | ICD-10-CM | POA: Diagnosis not present

## 2023-03-15 DIAGNOSIS — N2581 Secondary hyperparathyroidism of renal origin: Secondary | ICD-10-CM | POA: Diagnosis not present

## 2023-03-15 DIAGNOSIS — E1122 Type 2 diabetes mellitus with diabetic chronic kidney disease: Secondary | ICD-10-CM | POA: Diagnosis not present

## 2023-03-15 DIAGNOSIS — D631 Anemia in chronic kidney disease: Secondary | ICD-10-CM | POA: Diagnosis not present

## 2023-03-15 DIAGNOSIS — E875 Hyperkalemia: Secondary | ICD-10-CM | POA: Diagnosis not present

## 2023-03-15 DIAGNOSIS — Z992 Dependence on renal dialysis: Secondary | ICD-10-CM | POA: Diagnosis not present

## 2023-03-15 DIAGNOSIS — D689 Coagulation defect, unspecified: Secondary | ICD-10-CM | POA: Diagnosis not present

## 2023-03-15 DIAGNOSIS — N186 End stage renal disease: Secondary | ICD-10-CM | POA: Diagnosis not present

## 2023-03-18 DIAGNOSIS — Z992 Dependence on renal dialysis: Secondary | ICD-10-CM | POA: Diagnosis not present

## 2023-03-18 DIAGNOSIS — E875 Hyperkalemia: Secondary | ICD-10-CM | POA: Diagnosis not present

## 2023-03-18 DIAGNOSIS — E1122 Type 2 diabetes mellitus with diabetic chronic kidney disease: Secondary | ICD-10-CM | POA: Diagnosis not present

## 2023-03-18 DIAGNOSIS — N186 End stage renal disease: Secondary | ICD-10-CM | POA: Diagnosis not present

## 2023-03-18 DIAGNOSIS — D509 Iron deficiency anemia, unspecified: Secondary | ICD-10-CM | POA: Diagnosis not present

## 2023-03-18 DIAGNOSIS — D631 Anemia in chronic kidney disease: Secondary | ICD-10-CM | POA: Diagnosis not present

## 2023-03-18 DIAGNOSIS — D689 Coagulation defect, unspecified: Secondary | ICD-10-CM | POA: Diagnosis not present

## 2023-03-18 DIAGNOSIS — N2581 Secondary hyperparathyroidism of renal origin: Secondary | ICD-10-CM | POA: Diagnosis not present

## 2023-03-20 DIAGNOSIS — E875 Hyperkalemia: Secondary | ICD-10-CM | POA: Diagnosis not present

## 2023-03-20 DIAGNOSIS — Z992 Dependence on renal dialysis: Secondary | ICD-10-CM | POA: Diagnosis not present

## 2023-03-20 DIAGNOSIS — N2581 Secondary hyperparathyroidism of renal origin: Secondary | ICD-10-CM | POA: Diagnosis not present

## 2023-03-20 DIAGNOSIS — D509 Iron deficiency anemia, unspecified: Secondary | ICD-10-CM | POA: Diagnosis not present

## 2023-03-20 DIAGNOSIS — E1122 Type 2 diabetes mellitus with diabetic chronic kidney disease: Secondary | ICD-10-CM | POA: Diagnosis not present

## 2023-03-20 DIAGNOSIS — D689 Coagulation defect, unspecified: Secondary | ICD-10-CM | POA: Diagnosis not present

## 2023-03-20 DIAGNOSIS — N186 End stage renal disease: Secondary | ICD-10-CM | POA: Diagnosis not present

## 2023-03-20 DIAGNOSIS — D631 Anemia in chronic kidney disease: Secondary | ICD-10-CM | POA: Diagnosis not present

## 2023-03-22 DIAGNOSIS — E1122 Type 2 diabetes mellitus with diabetic chronic kidney disease: Secondary | ICD-10-CM | POA: Diagnosis not present

## 2023-03-22 DIAGNOSIS — N186 End stage renal disease: Secondary | ICD-10-CM | POA: Diagnosis not present

## 2023-03-22 DIAGNOSIS — D631 Anemia in chronic kidney disease: Secondary | ICD-10-CM | POA: Diagnosis not present

## 2023-03-22 DIAGNOSIS — Z992 Dependence on renal dialysis: Secondary | ICD-10-CM | POA: Diagnosis not present

## 2023-03-22 DIAGNOSIS — D509 Iron deficiency anemia, unspecified: Secondary | ICD-10-CM | POA: Diagnosis not present

## 2023-03-22 DIAGNOSIS — N2581 Secondary hyperparathyroidism of renal origin: Secondary | ICD-10-CM | POA: Diagnosis not present

## 2023-03-22 DIAGNOSIS — D689 Coagulation defect, unspecified: Secondary | ICD-10-CM | POA: Diagnosis not present

## 2023-03-22 DIAGNOSIS — E875 Hyperkalemia: Secondary | ICD-10-CM | POA: Diagnosis not present

## 2023-03-25 DIAGNOSIS — D689 Coagulation defect, unspecified: Secondary | ICD-10-CM | POA: Diagnosis not present

## 2023-03-25 DIAGNOSIS — E875 Hyperkalemia: Secondary | ICD-10-CM | POA: Diagnosis not present

## 2023-03-25 DIAGNOSIS — E1122 Type 2 diabetes mellitus with diabetic chronic kidney disease: Secondary | ICD-10-CM | POA: Diagnosis not present

## 2023-03-25 DIAGNOSIS — D509 Iron deficiency anemia, unspecified: Secondary | ICD-10-CM | POA: Diagnosis not present

## 2023-03-25 DIAGNOSIS — Z992 Dependence on renal dialysis: Secondary | ICD-10-CM | POA: Diagnosis not present

## 2023-03-25 DIAGNOSIS — N186 End stage renal disease: Secondary | ICD-10-CM | POA: Diagnosis not present

## 2023-03-25 DIAGNOSIS — D631 Anemia in chronic kidney disease: Secondary | ICD-10-CM | POA: Diagnosis not present

## 2023-03-25 DIAGNOSIS — N2581 Secondary hyperparathyroidism of renal origin: Secondary | ICD-10-CM | POA: Diagnosis not present

## 2023-03-27 DIAGNOSIS — N2581 Secondary hyperparathyroidism of renal origin: Secondary | ICD-10-CM | POA: Diagnosis not present

## 2023-03-27 DIAGNOSIS — N186 End stage renal disease: Secondary | ICD-10-CM | POA: Diagnosis not present

## 2023-03-27 DIAGNOSIS — D689 Coagulation defect, unspecified: Secondary | ICD-10-CM | POA: Diagnosis not present

## 2023-03-27 DIAGNOSIS — D509 Iron deficiency anemia, unspecified: Secondary | ICD-10-CM | POA: Diagnosis not present

## 2023-03-27 DIAGNOSIS — E875 Hyperkalemia: Secondary | ICD-10-CM | POA: Diagnosis not present

## 2023-03-27 DIAGNOSIS — E1122 Type 2 diabetes mellitus with diabetic chronic kidney disease: Secondary | ICD-10-CM | POA: Diagnosis not present

## 2023-03-27 DIAGNOSIS — D631 Anemia in chronic kidney disease: Secondary | ICD-10-CM | POA: Diagnosis not present

## 2023-03-27 DIAGNOSIS — Z992 Dependence on renal dialysis: Secondary | ICD-10-CM | POA: Diagnosis not present

## 2023-03-28 DIAGNOSIS — N186 End stage renal disease: Secondary | ICD-10-CM | POA: Diagnosis not present

## 2023-03-28 DIAGNOSIS — N032 Chronic nephritic syndrome with diffuse membranous glomerulonephritis: Secondary | ICD-10-CM | POA: Diagnosis not present

## 2023-03-28 DIAGNOSIS — Z992 Dependence on renal dialysis: Secondary | ICD-10-CM | POA: Diagnosis not present

## 2023-03-29 DIAGNOSIS — E875 Hyperkalemia: Secondary | ICD-10-CM | POA: Diagnosis not present

## 2023-03-29 DIAGNOSIS — E1122 Type 2 diabetes mellitus with diabetic chronic kidney disease: Secondary | ICD-10-CM | POA: Diagnosis not present

## 2023-03-29 DIAGNOSIS — D689 Coagulation defect, unspecified: Secondary | ICD-10-CM | POA: Diagnosis not present

## 2023-03-29 DIAGNOSIS — N186 End stage renal disease: Secondary | ICD-10-CM | POA: Diagnosis not present

## 2023-03-29 DIAGNOSIS — Z992 Dependence on renal dialysis: Secondary | ICD-10-CM | POA: Diagnosis not present

## 2023-03-29 DIAGNOSIS — N2581 Secondary hyperparathyroidism of renal origin: Secondary | ICD-10-CM | POA: Diagnosis not present

## 2023-03-29 DIAGNOSIS — D631 Anemia in chronic kidney disease: Secondary | ICD-10-CM | POA: Diagnosis not present

## 2023-03-29 DIAGNOSIS — D509 Iron deficiency anemia, unspecified: Secondary | ICD-10-CM | POA: Diagnosis not present

## 2023-04-01 DIAGNOSIS — E1122 Type 2 diabetes mellitus with diabetic chronic kidney disease: Secondary | ICD-10-CM | POA: Diagnosis not present

## 2023-04-01 DIAGNOSIS — D689 Coagulation defect, unspecified: Secondary | ICD-10-CM | POA: Diagnosis not present

## 2023-04-01 DIAGNOSIS — Z992 Dependence on renal dialysis: Secondary | ICD-10-CM | POA: Diagnosis not present

## 2023-04-01 DIAGNOSIS — N186 End stage renal disease: Secondary | ICD-10-CM | POA: Diagnosis not present

## 2023-04-01 DIAGNOSIS — N2581 Secondary hyperparathyroidism of renal origin: Secondary | ICD-10-CM | POA: Diagnosis not present

## 2023-04-01 DIAGNOSIS — D509 Iron deficiency anemia, unspecified: Secondary | ICD-10-CM | POA: Diagnosis not present

## 2023-04-01 DIAGNOSIS — E875 Hyperkalemia: Secondary | ICD-10-CM | POA: Diagnosis not present

## 2023-04-01 DIAGNOSIS — D631 Anemia in chronic kidney disease: Secondary | ICD-10-CM | POA: Diagnosis not present

## 2023-04-03 DIAGNOSIS — D509 Iron deficiency anemia, unspecified: Secondary | ICD-10-CM | POA: Diagnosis not present

## 2023-04-03 DIAGNOSIS — D631 Anemia in chronic kidney disease: Secondary | ICD-10-CM | POA: Diagnosis not present

## 2023-04-03 DIAGNOSIS — N2581 Secondary hyperparathyroidism of renal origin: Secondary | ICD-10-CM | POA: Diagnosis not present

## 2023-04-03 DIAGNOSIS — E875 Hyperkalemia: Secondary | ICD-10-CM | POA: Diagnosis not present

## 2023-04-03 DIAGNOSIS — N186 End stage renal disease: Secondary | ICD-10-CM | POA: Diagnosis not present

## 2023-04-03 DIAGNOSIS — D689 Coagulation defect, unspecified: Secondary | ICD-10-CM | POA: Diagnosis not present

## 2023-04-03 DIAGNOSIS — E1122 Type 2 diabetes mellitus with diabetic chronic kidney disease: Secondary | ICD-10-CM | POA: Diagnosis not present

## 2023-04-03 DIAGNOSIS — Z992 Dependence on renal dialysis: Secondary | ICD-10-CM | POA: Diagnosis not present

## 2023-04-05 DIAGNOSIS — D631 Anemia in chronic kidney disease: Secondary | ICD-10-CM | POA: Diagnosis not present

## 2023-04-05 DIAGNOSIS — N2581 Secondary hyperparathyroidism of renal origin: Secondary | ICD-10-CM | POA: Diagnosis not present

## 2023-04-05 DIAGNOSIS — D689 Coagulation defect, unspecified: Secondary | ICD-10-CM | POA: Diagnosis not present

## 2023-04-05 DIAGNOSIS — E1122 Type 2 diabetes mellitus with diabetic chronic kidney disease: Secondary | ICD-10-CM | POA: Diagnosis not present

## 2023-04-05 DIAGNOSIS — D509 Iron deficiency anemia, unspecified: Secondary | ICD-10-CM | POA: Diagnosis not present

## 2023-04-05 DIAGNOSIS — E875 Hyperkalemia: Secondary | ICD-10-CM | POA: Diagnosis not present

## 2023-04-05 DIAGNOSIS — Z992 Dependence on renal dialysis: Secondary | ICD-10-CM | POA: Diagnosis not present

## 2023-04-05 DIAGNOSIS — N186 End stage renal disease: Secondary | ICD-10-CM | POA: Diagnosis not present

## 2023-04-08 DIAGNOSIS — D509 Iron deficiency anemia, unspecified: Secondary | ICD-10-CM | POA: Diagnosis not present

## 2023-04-08 DIAGNOSIS — Z992 Dependence on renal dialysis: Secondary | ICD-10-CM | POA: Diagnosis not present

## 2023-04-08 DIAGNOSIS — E1122 Type 2 diabetes mellitus with diabetic chronic kidney disease: Secondary | ICD-10-CM | POA: Diagnosis not present

## 2023-04-08 DIAGNOSIS — E875 Hyperkalemia: Secondary | ICD-10-CM | POA: Diagnosis not present

## 2023-04-08 DIAGNOSIS — N186 End stage renal disease: Secondary | ICD-10-CM | POA: Diagnosis not present

## 2023-04-08 DIAGNOSIS — D631 Anemia in chronic kidney disease: Secondary | ICD-10-CM | POA: Diagnosis not present

## 2023-04-08 DIAGNOSIS — D689 Coagulation defect, unspecified: Secondary | ICD-10-CM | POA: Diagnosis not present

## 2023-04-08 DIAGNOSIS — N2581 Secondary hyperparathyroidism of renal origin: Secondary | ICD-10-CM | POA: Diagnosis not present

## 2023-04-10 ENCOUNTER — Ambulatory Visit (INDEPENDENT_AMBULATORY_CARE_PROVIDER_SITE_OTHER): Payer: 59

## 2023-04-10 DIAGNOSIS — E875 Hyperkalemia: Secondary | ICD-10-CM | POA: Diagnosis not present

## 2023-04-10 DIAGNOSIS — E1122 Type 2 diabetes mellitus with diabetic chronic kidney disease: Secondary | ICD-10-CM | POA: Diagnosis not present

## 2023-04-10 DIAGNOSIS — Z992 Dependence on renal dialysis: Secondary | ICD-10-CM | POA: Diagnosis not present

## 2023-04-10 DIAGNOSIS — D631 Anemia in chronic kidney disease: Secondary | ICD-10-CM | POA: Diagnosis not present

## 2023-04-10 DIAGNOSIS — I255 Ischemic cardiomyopathy: Secondary | ICD-10-CM

## 2023-04-10 DIAGNOSIS — N2581 Secondary hyperparathyroidism of renal origin: Secondary | ICD-10-CM | POA: Diagnosis not present

## 2023-04-10 DIAGNOSIS — D689 Coagulation defect, unspecified: Secondary | ICD-10-CM | POA: Diagnosis not present

## 2023-04-10 DIAGNOSIS — N186 End stage renal disease: Secondary | ICD-10-CM | POA: Diagnosis not present

## 2023-04-10 DIAGNOSIS — D509 Iron deficiency anemia, unspecified: Secondary | ICD-10-CM | POA: Diagnosis not present

## 2023-04-11 LAB — CUP PACEART REMOTE DEVICE CHECK
Battery Remaining Longevity: 51 mo
Battery Voltage: 2.97 V
Brady Statistic RV Percent Paced: 0.02 %
Date Time Interrogation Session: 20240613022825
HighPow Impedance: 254 Ohm
HighPow Impedance: 57 Ohm
Implantable Lead Connection Status: 753985
Implantable Lead Implant Date: 20210315
Implantable Lead Location: 753860
Implantable Lead Model: 6935
Implantable Pulse Generator Implant Date: 20190208
Lead Channel Impedance Value: 304 Ohm
Lead Channel Impedance Value: 418 Ohm
Lead Channel Pacing Threshold Amplitude: 0.5 V
Lead Channel Pacing Threshold Pulse Width: 0.4 ms
Lead Channel Sensing Intrinsic Amplitude: 9.5 mV
Lead Channel Sensing Intrinsic Amplitude: 9.5 mV
Lead Channel Setting Pacing Amplitude: 2 V
Lead Channel Setting Pacing Pulse Width: 0.4 ms
Lead Channel Setting Sensing Sensitivity: 0.3 mV
Zone Setting Status: 755011

## 2023-04-12 DIAGNOSIS — E875 Hyperkalemia: Secondary | ICD-10-CM | POA: Diagnosis not present

## 2023-04-12 DIAGNOSIS — Z992 Dependence on renal dialysis: Secondary | ICD-10-CM | POA: Diagnosis not present

## 2023-04-12 DIAGNOSIS — N2581 Secondary hyperparathyroidism of renal origin: Secondary | ICD-10-CM | POA: Diagnosis not present

## 2023-04-12 DIAGNOSIS — E1122 Type 2 diabetes mellitus with diabetic chronic kidney disease: Secondary | ICD-10-CM | POA: Diagnosis not present

## 2023-04-12 DIAGNOSIS — D509 Iron deficiency anemia, unspecified: Secondary | ICD-10-CM | POA: Diagnosis not present

## 2023-04-12 DIAGNOSIS — N186 End stage renal disease: Secondary | ICD-10-CM | POA: Diagnosis not present

## 2023-04-12 DIAGNOSIS — D631 Anemia in chronic kidney disease: Secondary | ICD-10-CM | POA: Diagnosis not present

## 2023-04-12 DIAGNOSIS — D689 Coagulation defect, unspecified: Secondary | ICD-10-CM | POA: Diagnosis not present

## 2023-04-15 DIAGNOSIS — D509 Iron deficiency anemia, unspecified: Secondary | ICD-10-CM | POA: Diagnosis not present

## 2023-04-15 DIAGNOSIS — D631 Anemia in chronic kidney disease: Secondary | ICD-10-CM | POA: Diagnosis not present

## 2023-04-15 DIAGNOSIS — N186 End stage renal disease: Secondary | ICD-10-CM | POA: Diagnosis not present

## 2023-04-15 DIAGNOSIS — E875 Hyperkalemia: Secondary | ICD-10-CM | POA: Diagnosis not present

## 2023-04-15 DIAGNOSIS — N2581 Secondary hyperparathyroidism of renal origin: Secondary | ICD-10-CM | POA: Diagnosis not present

## 2023-04-15 DIAGNOSIS — D689 Coagulation defect, unspecified: Secondary | ICD-10-CM | POA: Diagnosis not present

## 2023-04-15 DIAGNOSIS — Z992 Dependence on renal dialysis: Secondary | ICD-10-CM | POA: Diagnosis not present

## 2023-04-15 DIAGNOSIS — E1122 Type 2 diabetes mellitus with diabetic chronic kidney disease: Secondary | ICD-10-CM | POA: Diagnosis not present

## 2023-04-17 DIAGNOSIS — E1122 Type 2 diabetes mellitus with diabetic chronic kidney disease: Secondary | ICD-10-CM | POA: Diagnosis not present

## 2023-04-17 DIAGNOSIS — D509 Iron deficiency anemia, unspecified: Secondary | ICD-10-CM | POA: Diagnosis not present

## 2023-04-17 DIAGNOSIS — E875 Hyperkalemia: Secondary | ICD-10-CM | POA: Diagnosis not present

## 2023-04-17 DIAGNOSIS — Z992 Dependence on renal dialysis: Secondary | ICD-10-CM | POA: Diagnosis not present

## 2023-04-17 DIAGNOSIS — D631 Anemia in chronic kidney disease: Secondary | ICD-10-CM | POA: Diagnosis not present

## 2023-04-17 DIAGNOSIS — N2581 Secondary hyperparathyroidism of renal origin: Secondary | ICD-10-CM | POA: Diagnosis not present

## 2023-04-17 DIAGNOSIS — D689 Coagulation defect, unspecified: Secondary | ICD-10-CM | POA: Diagnosis not present

## 2023-04-17 DIAGNOSIS — N186 End stage renal disease: Secondary | ICD-10-CM | POA: Diagnosis not present

## 2023-04-19 DIAGNOSIS — D689 Coagulation defect, unspecified: Secondary | ICD-10-CM | POA: Diagnosis not present

## 2023-04-19 DIAGNOSIS — D631 Anemia in chronic kidney disease: Secondary | ICD-10-CM | POA: Diagnosis not present

## 2023-04-19 DIAGNOSIS — N2581 Secondary hyperparathyroidism of renal origin: Secondary | ICD-10-CM | POA: Diagnosis not present

## 2023-04-19 DIAGNOSIS — N186 End stage renal disease: Secondary | ICD-10-CM | POA: Diagnosis not present

## 2023-04-19 DIAGNOSIS — E1122 Type 2 diabetes mellitus with diabetic chronic kidney disease: Secondary | ICD-10-CM | POA: Diagnosis not present

## 2023-04-19 DIAGNOSIS — E875 Hyperkalemia: Secondary | ICD-10-CM | POA: Diagnosis not present

## 2023-04-19 DIAGNOSIS — D509 Iron deficiency anemia, unspecified: Secondary | ICD-10-CM | POA: Diagnosis not present

## 2023-04-19 DIAGNOSIS — Z992 Dependence on renal dialysis: Secondary | ICD-10-CM | POA: Diagnosis not present

## 2023-04-22 DIAGNOSIS — N186 End stage renal disease: Secondary | ICD-10-CM | POA: Diagnosis not present

## 2023-04-22 DIAGNOSIS — Z992 Dependence on renal dialysis: Secondary | ICD-10-CM | POA: Diagnosis not present

## 2023-04-22 DIAGNOSIS — D631 Anemia in chronic kidney disease: Secondary | ICD-10-CM | POA: Diagnosis not present

## 2023-04-22 DIAGNOSIS — E875 Hyperkalemia: Secondary | ICD-10-CM | POA: Diagnosis not present

## 2023-04-22 DIAGNOSIS — D509 Iron deficiency anemia, unspecified: Secondary | ICD-10-CM | POA: Diagnosis not present

## 2023-04-22 DIAGNOSIS — E1122 Type 2 diabetes mellitus with diabetic chronic kidney disease: Secondary | ICD-10-CM | POA: Diagnosis not present

## 2023-04-22 DIAGNOSIS — N2581 Secondary hyperparathyroidism of renal origin: Secondary | ICD-10-CM | POA: Diagnosis not present

## 2023-04-22 DIAGNOSIS — D689 Coagulation defect, unspecified: Secondary | ICD-10-CM | POA: Diagnosis not present

## 2023-04-24 DIAGNOSIS — D631 Anemia in chronic kidney disease: Secondary | ICD-10-CM | POA: Diagnosis not present

## 2023-04-24 DIAGNOSIS — N2581 Secondary hyperparathyroidism of renal origin: Secondary | ICD-10-CM | POA: Diagnosis not present

## 2023-04-24 DIAGNOSIS — E1122 Type 2 diabetes mellitus with diabetic chronic kidney disease: Secondary | ICD-10-CM | POA: Diagnosis not present

## 2023-04-24 DIAGNOSIS — D689 Coagulation defect, unspecified: Secondary | ICD-10-CM | POA: Diagnosis not present

## 2023-04-24 DIAGNOSIS — D509 Iron deficiency anemia, unspecified: Secondary | ICD-10-CM | POA: Diagnosis not present

## 2023-04-24 DIAGNOSIS — E875 Hyperkalemia: Secondary | ICD-10-CM | POA: Diagnosis not present

## 2023-04-24 DIAGNOSIS — Z992 Dependence on renal dialysis: Secondary | ICD-10-CM | POA: Diagnosis not present

## 2023-04-24 DIAGNOSIS — N186 End stage renal disease: Secondary | ICD-10-CM | POA: Diagnosis not present

## 2023-04-26 DIAGNOSIS — D689 Coagulation defect, unspecified: Secondary | ICD-10-CM | POA: Diagnosis not present

## 2023-04-26 DIAGNOSIS — N186 End stage renal disease: Secondary | ICD-10-CM | POA: Diagnosis not present

## 2023-04-26 DIAGNOSIS — E875 Hyperkalemia: Secondary | ICD-10-CM | POA: Diagnosis not present

## 2023-04-26 DIAGNOSIS — Z992 Dependence on renal dialysis: Secondary | ICD-10-CM | POA: Diagnosis not present

## 2023-04-26 DIAGNOSIS — D509 Iron deficiency anemia, unspecified: Secondary | ICD-10-CM | POA: Diagnosis not present

## 2023-04-26 DIAGNOSIS — E1122 Type 2 diabetes mellitus with diabetic chronic kidney disease: Secondary | ICD-10-CM | POA: Diagnosis not present

## 2023-04-26 DIAGNOSIS — D631 Anemia in chronic kidney disease: Secondary | ICD-10-CM | POA: Diagnosis not present

## 2023-04-26 DIAGNOSIS — N2581 Secondary hyperparathyroidism of renal origin: Secondary | ICD-10-CM | POA: Diagnosis not present

## 2023-04-27 DIAGNOSIS — N032 Chronic nephritic syndrome with diffuse membranous glomerulonephritis: Secondary | ICD-10-CM | POA: Diagnosis not present

## 2023-04-27 DIAGNOSIS — N186 End stage renal disease: Secondary | ICD-10-CM | POA: Diagnosis not present

## 2023-04-27 DIAGNOSIS — Z992 Dependence on renal dialysis: Secondary | ICD-10-CM | POA: Diagnosis not present

## 2023-04-29 DIAGNOSIS — Z992 Dependence on renal dialysis: Secondary | ICD-10-CM | POA: Diagnosis not present

## 2023-04-29 DIAGNOSIS — E875 Hyperkalemia: Secondary | ICD-10-CM | POA: Diagnosis not present

## 2023-04-29 DIAGNOSIS — D689 Coagulation defect, unspecified: Secondary | ICD-10-CM | POA: Diagnosis not present

## 2023-04-29 DIAGNOSIS — D509 Iron deficiency anemia, unspecified: Secondary | ICD-10-CM | POA: Diagnosis not present

## 2023-04-29 DIAGNOSIS — N186 End stage renal disease: Secondary | ICD-10-CM | POA: Diagnosis not present

## 2023-04-29 DIAGNOSIS — D631 Anemia in chronic kidney disease: Secondary | ICD-10-CM | POA: Diagnosis not present

## 2023-04-29 DIAGNOSIS — E1122 Type 2 diabetes mellitus with diabetic chronic kidney disease: Secondary | ICD-10-CM | POA: Diagnosis not present

## 2023-04-29 DIAGNOSIS — N2581 Secondary hyperparathyroidism of renal origin: Secondary | ICD-10-CM | POA: Diagnosis not present

## 2023-05-01 DIAGNOSIS — D689 Coagulation defect, unspecified: Secondary | ICD-10-CM | POA: Diagnosis not present

## 2023-05-01 DIAGNOSIS — E875 Hyperkalemia: Secondary | ICD-10-CM | POA: Diagnosis not present

## 2023-05-01 DIAGNOSIS — E1122 Type 2 diabetes mellitus with diabetic chronic kidney disease: Secondary | ICD-10-CM | POA: Diagnosis not present

## 2023-05-01 DIAGNOSIS — D509 Iron deficiency anemia, unspecified: Secondary | ICD-10-CM | POA: Diagnosis not present

## 2023-05-01 DIAGNOSIS — Z992 Dependence on renal dialysis: Secondary | ICD-10-CM | POA: Diagnosis not present

## 2023-05-01 DIAGNOSIS — D631 Anemia in chronic kidney disease: Secondary | ICD-10-CM | POA: Diagnosis not present

## 2023-05-01 DIAGNOSIS — N2581 Secondary hyperparathyroidism of renal origin: Secondary | ICD-10-CM | POA: Diagnosis not present

## 2023-05-01 DIAGNOSIS — N186 End stage renal disease: Secondary | ICD-10-CM | POA: Diagnosis not present

## 2023-05-03 DIAGNOSIS — E875 Hyperkalemia: Secondary | ICD-10-CM | POA: Diagnosis not present

## 2023-05-03 DIAGNOSIS — D509 Iron deficiency anemia, unspecified: Secondary | ICD-10-CM | POA: Diagnosis not present

## 2023-05-03 DIAGNOSIS — D631 Anemia in chronic kidney disease: Secondary | ICD-10-CM | POA: Diagnosis not present

## 2023-05-03 DIAGNOSIS — N2581 Secondary hyperparathyroidism of renal origin: Secondary | ICD-10-CM | POA: Diagnosis not present

## 2023-05-03 DIAGNOSIS — N186 End stage renal disease: Secondary | ICD-10-CM | POA: Diagnosis not present

## 2023-05-03 DIAGNOSIS — D689 Coagulation defect, unspecified: Secondary | ICD-10-CM | POA: Diagnosis not present

## 2023-05-03 DIAGNOSIS — E1122 Type 2 diabetes mellitus with diabetic chronic kidney disease: Secondary | ICD-10-CM | POA: Diagnosis not present

## 2023-05-03 DIAGNOSIS — Z992 Dependence on renal dialysis: Secondary | ICD-10-CM | POA: Diagnosis not present

## 2023-05-05 NOTE — Progress Notes (Signed)
Remote ICD transmission.   

## 2023-05-06 DIAGNOSIS — D631 Anemia in chronic kidney disease: Secondary | ICD-10-CM | POA: Diagnosis not present

## 2023-05-06 DIAGNOSIS — D509 Iron deficiency anemia, unspecified: Secondary | ICD-10-CM | POA: Diagnosis not present

## 2023-05-06 DIAGNOSIS — D689 Coagulation defect, unspecified: Secondary | ICD-10-CM | POA: Diagnosis not present

## 2023-05-06 DIAGNOSIS — N186 End stage renal disease: Secondary | ICD-10-CM | POA: Diagnosis not present

## 2023-05-06 DIAGNOSIS — E1122 Type 2 diabetes mellitus with diabetic chronic kidney disease: Secondary | ICD-10-CM | POA: Diagnosis not present

## 2023-05-06 DIAGNOSIS — E875 Hyperkalemia: Secondary | ICD-10-CM | POA: Diagnosis not present

## 2023-05-06 DIAGNOSIS — N2581 Secondary hyperparathyroidism of renal origin: Secondary | ICD-10-CM | POA: Diagnosis not present

## 2023-05-06 DIAGNOSIS — Z992 Dependence on renal dialysis: Secondary | ICD-10-CM | POA: Diagnosis not present

## 2023-05-08 DIAGNOSIS — N2581 Secondary hyperparathyroidism of renal origin: Secondary | ICD-10-CM | POA: Diagnosis not present

## 2023-05-08 DIAGNOSIS — D631 Anemia in chronic kidney disease: Secondary | ICD-10-CM | POA: Diagnosis not present

## 2023-05-08 DIAGNOSIS — N186 End stage renal disease: Secondary | ICD-10-CM | POA: Diagnosis not present

## 2023-05-08 DIAGNOSIS — E875 Hyperkalemia: Secondary | ICD-10-CM | POA: Diagnosis not present

## 2023-05-08 DIAGNOSIS — E1122 Type 2 diabetes mellitus with diabetic chronic kidney disease: Secondary | ICD-10-CM | POA: Diagnosis not present

## 2023-05-08 DIAGNOSIS — D509 Iron deficiency anemia, unspecified: Secondary | ICD-10-CM | POA: Diagnosis not present

## 2023-05-08 DIAGNOSIS — Z992 Dependence on renal dialysis: Secondary | ICD-10-CM | POA: Diagnosis not present

## 2023-05-08 DIAGNOSIS — D689 Coagulation defect, unspecified: Secondary | ICD-10-CM | POA: Diagnosis not present

## 2023-05-10 DIAGNOSIS — N186 End stage renal disease: Secondary | ICD-10-CM | POA: Diagnosis not present

## 2023-05-10 DIAGNOSIS — D509 Iron deficiency anemia, unspecified: Secondary | ICD-10-CM | POA: Diagnosis not present

## 2023-05-10 DIAGNOSIS — E875 Hyperkalemia: Secondary | ICD-10-CM | POA: Diagnosis not present

## 2023-05-10 DIAGNOSIS — D689 Coagulation defect, unspecified: Secondary | ICD-10-CM | POA: Diagnosis not present

## 2023-05-10 DIAGNOSIS — E1122 Type 2 diabetes mellitus with diabetic chronic kidney disease: Secondary | ICD-10-CM | POA: Diagnosis not present

## 2023-05-10 DIAGNOSIS — D631 Anemia in chronic kidney disease: Secondary | ICD-10-CM | POA: Diagnosis not present

## 2023-05-10 DIAGNOSIS — N2581 Secondary hyperparathyroidism of renal origin: Secondary | ICD-10-CM | POA: Diagnosis not present

## 2023-05-10 DIAGNOSIS — Z992 Dependence on renal dialysis: Secondary | ICD-10-CM | POA: Diagnosis not present

## 2023-05-13 DIAGNOSIS — D509 Iron deficiency anemia, unspecified: Secondary | ICD-10-CM | POA: Diagnosis not present

## 2023-05-13 DIAGNOSIS — N2581 Secondary hyperparathyroidism of renal origin: Secondary | ICD-10-CM | POA: Diagnosis not present

## 2023-05-13 DIAGNOSIS — E1122 Type 2 diabetes mellitus with diabetic chronic kidney disease: Secondary | ICD-10-CM | POA: Diagnosis not present

## 2023-05-13 DIAGNOSIS — Z992 Dependence on renal dialysis: Secondary | ICD-10-CM | POA: Diagnosis not present

## 2023-05-13 DIAGNOSIS — N186 End stage renal disease: Secondary | ICD-10-CM | POA: Diagnosis not present

## 2023-05-13 DIAGNOSIS — E875 Hyperkalemia: Secondary | ICD-10-CM | POA: Diagnosis not present

## 2023-05-13 DIAGNOSIS — D631 Anemia in chronic kidney disease: Secondary | ICD-10-CM | POA: Diagnosis not present

## 2023-05-13 DIAGNOSIS — D689 Coagulation defect, unspecified: Secondary | ICD-10-CM | POA: Diagnosis not present

## 2023-05-15 DIAGNOSIS — D631 Anemia in chronic kidney disease: Secondary | ICD-10-CM | POA: Diagnosis not present

## 2023-05-15 DIAGNOSIS — D509 Iron deficiency anemia, unspecified: Secondary | ICD-10-CM | POA: Diagnosis not present

## 2023-05-15 DIAGNOSIS — Z992 Dependence on renal dialysis: Secondary | ICD-10-CM | POA: Diagnosis not present

## 2023-05-15 DIAGNOSIS — D689 Coagulation defect, unspecified: Secondary | ICD-10-CM | POA: Diagnosis not present

## 2023-05-15 DIAGNOSIS — E875 Hyperkalemia: Secondary | ICD-10-CM | POA: Diagnosis not present

## 2023-05-15 DIAGNOSIS — N186 End stage renal disease: Secondary | ICD-10-CM | POA: Diagnosis not present

## 2023-05-15 DIAGNOSIS — E1122 Type 2 diabetes mellitus with diabetic chronic kidney disease: Secondary | ICD-10-CM | POA: Diagnosis not present

## 2023-05-15 DIAGNOSIS — N2581 Secondary hyperparathyroidism of renal origin: Secondary | ICD-10-CM | POA: Diagnosis not present

## 2023-05-17 DIAGNOSIS — N186 End stage renal disease: Secondary | ICD-10-CM | POA: Diagnosis not present

## 2023-05-17 DIAGNOSIS — D631 Anemia in chronic kidney disease: Secondary | ICD-10-CM | POA: Diagnosis not present

## 2023-05-17 DIAGNOSIS — E875 Hyperkalemia: Secondary | ICD-10-CM | POA: Diagnosis not present

## 2023-05-17 DIAGNOSIS — D689 Coagulation defect, unspecified: Secondary | ICD-10-CM | POA: Diagnosis not present

## 2023-05-17 DIAGNOSIS — N2581 Secondary hyperparathyroidism of renal origin: Secondary | ICD-10-CM | POA: Diagnosis not present

## 2023-05-17 DIAGNOSIS — E1122 Type 2 diabetes mellitus with diabetic chronic kidney disease: Secondary | ICD-10-CM | POA: Diagnosis not present

## 2023-05-17 DIAGNOSIS — D509 Iron deficiency anemia, unspecified: Secondary | ICD-10-CM | POA: Diagnosis not present

## 2023-05-17 DIAGNOSIS — Z992 Dependence on renal dialysis: Secondary | ICD-10-CM | POA: Diagnosis not present

## 2023-05-20 DIAGNOSIS — E1122 Type 2 diabetes mellitus with diabetic chronic kidney disease: Secondary | ICD-10-CM | POA: Diagnosis not present

## 2023-05-20 DIAGNOSIS — Z992 Dependence on renal dialysis: Secondary | ICD-10-CM | POA: Diagnosis not present

## 2023-05-20 DIAGNOSIS — N2581 Secondary hyperparathyroidism of renal origin: Secondary | ICD-10-CM | POA: Diagnosis not present

## 2023-05-20 DIAGNOSIS — D689 Coagulation defect, unspecified: Secondary | ICD-10-CM | POA: Diagnosis not present

## 2023-05-20 DIAGNOSIS — N186 End stage renal disease: Secondary | ICD-10-CM | POA: Diagnosis not present

## 2023-05-20 DIAGNOSIS — D631 Anemia in chronic kidney disease: Secondary | ICD-10-CM | POA: Diagnosis not present

## 2023-05-20 DIAGNOSIS — E875 Hyperkalemia: Secondary | ICD-10-CM | POA: Diagnosis not present

## 2023-05-20 DIAGNOSIS — D509 Iron deficiency anemia, unspecified: Secondary | ICD-10-CM | POA: Diagnosis not present

## 2023-05-22 DIAGNOSIS — D631 Anemia in chronic kidney disease: Secondary | ICD-10-CM | POA: Diagnosis not present

## 2023-05-22 DIAGNOSIS — D689 Coagulation defect, unspecified: Secondary | ICD-10-CM | POA: Diagnosis not present

## 2023-05-22 DIAGNOSIS — E875 Hyperkalemia: Secondary | ICD-10-CM | POA: Diagnosis not present

## 2023-05-22 DIAGNOSIS — N186 End stage renal disease: Secondary | ICD-10-CM | POA: Diagnosis not present

## 2023-05-22 DIAGNOSIS — N2581 Secondary hyperparathyroidism of renal origin: Secondary | ICD-10-CM | POA: Diagnosis not present

## 2023-05-22 DIAGNOSIS — Z992 Dependence on renal dialysis: Secondary | ICD-10-CM | POA: Diagnosis not present

## 2023-05-22 DIAGNOSIS — E1122 Type 2 diabetes mellitus with diabetic chronic kidney disease: Secondary | ICD-10-CM | POA: Diagnosis not present

## 2023-05-22 DIAGNOSIS — D509 Iron deficiency anemia, unspecified: Secondary | ICD-10-CM | POA: Diagnosis not present

## 2023-05-24 DIAGNOSIS — N2581 Secondary hyperparathyroidism of renal origin: Secondary | ICD-10-CM | POA: Diagnosis not present

## 2023-05-24 DIAGNOSIS — E1122 Type 2 diabetes mellitus with diabetic chronic kidney disease: Secondary | ICD-10-CM | POA: Diagnosis not present

## 2023-05-24 DIAGNOSIS — D689 Coagulation defect, unspecified: Secondary | ICD-10-CM | POA: Diagnosis not present

## 2023-05-24 DIAGNOSIS — D509 Iron deficiency anemia, unspecified: Secondary | ICD-10-CM | POA: Diagnosis not present

## 2023-05-24 DIAGNOSIS — N186 End stage renal disease: Secondary | ICD-10-CM | POA: Diagnosis not present

## 2023-05-24 DIAGNOSIS — E875 Hyperkalemia: Secondary | ICD-10-CM | POA: Diagnosis not present

## 2023-05-24 DIAGNOSIS — D631 Anemia in chronic kidney disease: Secondary | ICD-10-CM | POA: Diagnosis not present

## 2023-05-24 DIAGNOSIS — Z992 Dependence on renal dialysis: Secondary | ICD-10-CM | POA: Diagnosis not present

## 2023-05-27 DIAGNOSIS — E875 Hyperkalemia: Secondary | ICD-10-CM | POA: Diagnosis not present

## 2023-05-27 DIAGNOSIS — Z992 Dependence on renal dialysis: Secondary | ICD-10-CM | POA: Diagnosis not present

## 2023-05-27 DIAGNOSIS — D631 Anemia in chronic kidney disease: Secondary | ICD-10-CM | POA: Diagnosis not present

## 2023-05-27 DIAGNOSIS — N2581 Secondary hyperparathyroidism of renal origin: Secondary | ICD-10-CM | POA: Diagnosis not present

## 2023-05-27 DIAGNOSIS — D509 Iron deficiency anemia, unspecified: Secondary | ICD-10-CM | POA: Diagnosis not present

## 2023-05-27 DIAGNOSIS — E1122 Type 2 diabetes mellitus with diabetic chronic kidney disease: Secondary | ICD-10-CM | POA: Diagnosis not present

## 2023-05-27 DIAGNOSIS — N186 End stage renal disease: Secondary | ICD-10-CM | POA: Diagnosis not present

## 2023-05-27 DIAGNOSIS — D689 Coagulation defect, unspecified: Secondary | ICD-10-CM | POA: Diagnosis not present

## 2023-05-28 DIAGNOSIS — N032 Chronic nephritic syndrome with diffuse membranous glomerulonephritis: Secondary | ICD-10-CM | POA: Diagnosis not present

## 2023-05-28 DIAGNOSIS — Z992 Dependence on renal dialysis: Secondary | ICD-10-CM | POA: Diagnosis not present

## 2023-05-28 DIAGNOSIS — N186 End stage renal disease: Secondary | ICD-10-CM | POA: Diagnosis not present

## 2023-05-29 DIAGNOSIS — D631 Anemia in chronic kidney disease: Secondary | ICD-10-CM | POA: Diagnosis not present

## 2023-05-29 DIAGNOSIS — N186 End stage renal disease: Secondary | ICD-10-CM | POA: Diagnosis not present

## 2023-05-29 DIAGNOSIS — E1122 Type 2 diabetes mellitus with diabetic chronic kidney disease: Secondary | ICD-10-CM | POA: Diagnosis not present

## 2023-05-29 DIAGNOSIS — D689 Coagulation defect, unspecified: Secondary | ICD-10-CM | POA: Diagnosis not present

## 2023-05-29 DIAGNOSIS — Z992 Dependence on renal dialysis: Secondary | ICD-10-CM | POA: Diagnosis not present

## 2023-05-29 DIAGNOSIS — E875 Hyperkalemia: Secondary | ICD-10-CM | POA: Diagnosis not present

## 2023-05-29 DIAGNOSIS — N2581 Secondary hyperparathyroidism of renal origin: Secondary | ICD-10-CM | POA: Diagnosis not present

## 2023-05-29 DIAGNOSIS — D509 Iron deficiency anemia, unspecified: Secondary | ICD-10-CM | POA: Diagnosis not present

## 2023-05-30 DIAGNOSIS — I7782 Antineutrophilic cytoplasmic antibody (ANCA) vasculitis: Secondary | ICD-10-CM | POA: Diagnosis not present

## 2023-05-30 DIAGNOSIS — H0589 Other disorders of orbit: Secondary | ICD-10-CM | POA: Diagnosis not present

## 2023-05-30 DIAGNOSIS — M3131 Wegener's granulomatosis with renal involvement: Secondary | ICD-10-CM | POA: Diagnosis not present

## 2023-05-31 DIAGNOSIS — E875 Hyperkalemia: Secondary | ICD-10-CM | POA: Diagnosis not present

## 2023-05-31 DIAGNOSIS — N2581 Secondary hyperparathyroidism of renal origin: Secondary | ICD-10-CM | POA: Diagnosis not present

## 2023-05-31 DIAGNOSIS — D689 Coagulation defect, unspecified: Secondary | ICD-10-CM | POA: Diagnosis not present

## 2023-05-31 DIAGNOSIS — N186 End stage renal disease: Secondary | ICD-10-CM | POA: Diagnosis not present

## 2023-05-31 DIAGNOSIS — D509 Iron deficiency anemia, unspecified: Secondary | ICD-10-CM | POA: Diagnosis not present

## 2023-05-31 DIAGNOSIS — E1122 Type 2 diabetes mellitus with diabetic chronic kidney disease: Secondary | ICD-10-CM | POA: Diagnosis not present

## 2023-05-31 DIAGNOSIS — D631 Anemia in chronic kidney disease: Secondary | ICD-10-CM | POA: Diagnosis not present

## 2023-05-31 DIAGNOSIS — Z992 Dependence on renal dialysis: Secondary | ICD-10-CM | POA: Diagnosis not present

## 2023-06-03 DIAGNOSIS — E1122 Type 2 diabetes mellitus with diabetic chronic kidney disease: Secondary | ICD-10-CM | POA: Diagnosis not present

## 2023-06-03 DIAGNOSIS — E875 Hyperkalemia: Secondary | ICD-10-CM | POA: Diagnosis not present

## 2023-06-03 DIAGNOSIS — Z992 Dependence on renal dialysis: Secondary | ICD-10-CM | POA: Diagnosis not present

## 2023-06-03 DIAGNOSIS — D689 Coagulation defect, unspecified: Secondary | ICD-10-CM | POA: Diagnosis not present

## 2023-06-03 DIAGNOSIS — N186 End stage renal disease: Secondary | ICD-10-CM | POA: Diagnosis not present

## 2023-06-03 DIAGNOSIS — D631 Anemia in chronic kidney disease: Secondary | ICD-10-CM | POA: Diagnosis not present

## 2023-06-03 DIAGNOSIS — N2581 Secondary hyperparathyroidism of renal origin: Secondary | ICD-10-CM | POA: Diagnosis not present

## 2023-06-03 DIAGNOSIS — D509 Iron deficiency anemia, unspecified: Secondary | ICD-10-CM | POA: Diagnosis not present

## 2023-06-05 DIAGNOSIS — D689 Coagulation defect, unspecified: Secondary | ICD-10-CM | POA: Diagnosis not present

## 2023-06-05 DIAGNOSIS — N186 End stage renal disease: Secondary | ICD-10-CM | POA: Diagnosis not present

## 2023-06-05 DIAGNOSIS — E875 Hyperkalemia: Secondary | ICD-10-CM | POA: Diagnosis not present

## 2023-06-05 DIAGNOSIS — N2581 Secondary hyperparathyroidism of renal origin: Secondary | ICD-10-CM | POA: Diagnosis not present

## 2023-06-05 DIAGNOSIS — Z992 Dependence on renal dialysis: Secondary | ICD-10-CM | POA: Diagnosis not present

## 2023-06-05 DIAGNOSIS — D509 Iron deficiency anemia, unspecified: Secondary | ICD-10-CM | POA: Diagnosis not present

## 2023-06-05 DIAGNOSIS — E1122 Type 2 diabetes mellitus with diabetic chronic kidney disease: Secondary | ICD-10-CM | POA: Diagnosis not present

## 2023-06-05 DIAGNOSIS — D631 Anemia in chronic kidney disease: Secondary | ICD-10-CM | POA: Diagnosis not present

## 2023-06-07 DIAGNOSIS — N2581 Secondary hyperparathyroidism of renal origin: Secondary | ICD-10-CM | POA: Diagnosis not present

## 2023-06-07 DIAGNOSIS — D689 Coagulation defect, unspecified: Secondary | ICD-10-CM | POA: Diagnosis not present

## 2023-06-07 DIAGNOSIS — E875 Hyperkalemia: Secondary | ICD-10-CM | POA: Diagnosis not present

## 2023-06-07 DIAGNOSIS — D631 Anemia in chronic kidney disease: Secondary | ICD-10-CM | POA: Diagnosis not present

## 2023-06-07 DIAGNOSIS — E1122 Type 2 diabetes mellitus with diabetic chronic kidney disease: Secondary | ICD-10-CM | POA: Diagnosis not present

## 2023-06-07 DIAGNOSIS — Z992 Dependence on renal dialysis: Secondary | ICD-10-CM | POA: Diagnosis not present

## 2023-06-07 DIAGNOSIS — D509 Iron deficiency anemia, unspecified: Secondary | ICD-10-CM | POA: Diagnosis not present

## 2023-06-07 DIAGNOSIS — N186 End stage renal disease: Secondary | ICD-10-CM | POA: Diagnosis not present

## 2023-06-10 DIAGNOSIS — D631 Anemia in chronic kidney disease: Secondary | ICD-10-CM | POA: Diagnosis not present

## 2023-06-10 DIAGNOSIS — E1122 Type 2 diabetes mellitus with diabetic chronic kidney disease: Secondary | ICD-10-CM | POA: Diagnosis not present

## 2023-06-10 DIAGNOSIS — Z992 Dependence on renal dialysis: Secondary | ICD-10-CM | POA: Diagnosis not present

## 2023-06-10 DIAGNOSIS — D509 Iron deficiency anemia, unspecified: Secondary | ICD-10-CM | POA: Diagnosis not present

## 2023-06-10 DIAGNOSIS — N2581 Secondary hyperparathyroidism of renal origin: Secondary | ICD-10-CM | POA: Diagnosis not present

## 2023-06-10 DIAGNOSIS — N186 End stage renal disease: Secondary | ICD-10-CM | POA: Diagnosis not present

## 2023-06-10 DIAGNOSIS — E875 Hyperkalemia: Secondary | ICD-10-CM | POA: Diagnosis not present

## 2023-06-10 DIAGNOSIS — D689 Coagulation defect, unspecified: Secondary | ICD-10-CM | POA: Diagnosis not present

## 2023-06-12 DIAGNOSIS — N2581 Secondary hyperparathyroidism of renal origin: Secondary | ICD-10-CM | POA: Diagnosis not present

## 2023-06-12 DIAGNOSIS — Z992 Dependence on renal dialysis: Secondary | ICD-10-CM | POA: Diagnosis not present

## 2023-06-12 DIAGNOSIS — D509 Iron deficiency anemia, unspecified: Secondary | ICD-10-CM | POA: Diagnosis not present

## 2023-06-12 DIAGNOSIS — D631 Anemia in chronic kidney disease: Secondary | ICD-10-CM | POA: Diagnosis not present

## 2023-06-12 DIAGNOSIS — N186 End stage renal disease: Secondary | ICD-10-CM | POA: Diagnosis not present

## 2023-06-12 DIAGNOSIS — D689 Coagulation defect, unspecified: Secondary | ICD-10-CM | POA: Diagnosis not present

## 2023-06-12 DIAGNOSIS — E1122 Type 2 diabetes mellitus with diabetic chronic kidney disease: Secondary | ICD-10-CM | POA: Diagnosis not present

## 2023-06-12 DIAGNOSIS — E875 Hyperkalemia: Secondary | ICD-10-CM | POA: Diagnosis not present

## 2023-06-14 DIAGNOSIS — N2581 Secondary hyperparathyroidism of renal origin: Secondary | ICD-10-CM | POA: Diagnosis not present

## 2023-06-14 DIAGNOSIS — D631 Anemia in chronic kidney disease: Secondary | ICD-10-CM | POA: Diagnosis not present

## 2023-06-14 DIAGNOSIS — E875 Hyperkalemia: Secondary | ICD-10-CM | POA: Diagnosis not present

## 2023-06-14 DIAGNOSIS — D509 Iron deficiency anemia, unspecified: Secondary | ICD-10-CM | POA: Diagnosis not present

## 2023-06-14 DIAGNOSIS — E1122 Type 2 diabetes mellitus with diabetic chronic kidney disease: Secondary | ICD-10-CM | POA: Diagnosis not present

## 2023-06-14 DIAGNOSIS — Z992 Dependence on renal dialysis: Secondary | ICD-10-CM | POA: Diagnosis not present

## 2023-06-14 DIAGNOSIS — N186 End stage renal disease: Secondary | ICD-10-CM | POA: Diagnosis not present

## 2023-06-14 DIAGNOSIS — D689 Coagulation defect, unspecified: Secondary | ICD-10-CM | POA: Diagnosis not present

## 2023-06-17 DIAGNOSIS — D689 Coagulation defect, unspecified: Secondary | ICD-10-CM | POA: Diagnosis not present

## 2023-06-17 DIAGNOSIS — Z992 Dependence on renal dialysis: Secondary | ICD-10-CM | POA: Diagnosis not present

## 2023-06-17 DIAGNOSIS — E1122 Type 2 diabetes mellitus with diabetic chronic kidney disease: Secondary | ICD-10-CM | POA: Diagnosis not present

## 2023-06-17 DIAGNOSIS — N2581 Secondary hyperparathyroidism of renal origin: Secondary | ICD-10-CM | POA: Diagnosis not present

## 2023-06-17 DIAGNOSIS — E875 Hyperkalemia: Secondary | ICD-10-CM | POA: Diagnosis not present

## 2023-06-17 DIAGNOSIS — D631 Anemia in chronic kidney disease: Secondary | ICD-10-CM | POA: Diagnosis not present

## 2023-06-17 DIAGNOSIS — D509 Iron deficiency anemia, unspecified: Secondary | ICD-10-CM | POA: Diagnosis not present

## 2023-06-17 DIAGNOSIS — N186 End stage renal disease: Secondary | ICD-10-CM | POA: Diagnosis not present

## 2023-06-18 DIAGNOSIS — I7782 Antineutrophilic cytoplasmic antibody (ANCA) vasculitis: Secondary | ICD-10-CM | POA: Diagnosis not present

## 2023-06-18 DIAGNOSIS — M3131 Wegener's granulomatosis with renal involvement: Secondary | ICD-10-CM | POA: Diagnosis not present

## 2023-06-18 DIAGNOSIS — Z79620 Long term (current) use of immunosuppressive biologic: Secondary | ICD-10-CM | POA: Diagnosis not present

## 2023-06-19 DIAGNOSIS — E875 Hyperkalemia: Secondary | ICD-10-CM | POA: Diagnosis not present

## 2023-06-19 DIAGNOSIS — D509 Iron deficiency anemia, unspecified: Secondary | ICD-10-CM | POA: Diagnosis not present

## 2023-06-19 DIAGNOSIS — Z992 Dependence on renal dialysis: Secondary | ICD-10-CM | POA: Diagnosis not present

## 2023-06-19 DIAGNOSIS — D631 Anemia in chronic kidney disease: Secondary | ICD-10-CM | POA: Diagnosis not present

## 2023-06-19 DIAGNOSIS — N186 End stage renal disease: Secondary | ICD-10-CM | POA: Diagnosis not present

## 2023-06-19 DIAGNOSIS — N2581 Secondary hyperparathyroidism of renal origin: Secondary | ICD-10-CM | POA: Diagnosis not present

## 2023-06-19 DIAGNOSIS — D689 Coagulation defect, unspecified: Secondary | ICD-10-CM | POA: Diagnosis not present

## 2023-06-19 DIAGNOSIS — E1122 Type 2 diabetes mellitus with diabetic chronic kidney disease: Secondary | ICD-10-CM | POA: Diagnosis not present

## 2023-06-21 DIAGNOSIS — N2581 Secondary hyperparathyroidism of renal origin: Secondary | ICD-10-CM | POA: Diagnosis not present

## 2023-06-21 DIAGNOSIS — E1122 Type 2 diabetes mellitus with diabetic chronic kidney disease: Secondary | ICD-10-CM | POA: Diagnosis not present

## 2023-06-21 DIAGNOSIS — Z992 Dependence on renal dialysis: Secondary | ICD-10-CM | POA: Diagnosis not present

## 2023-06-21 DIAGNOSIS — D509 Iron deficiency anemia, unspecified: Secondary | ICD-10-CM | POA: Diagnosis not present

## 2023-06-21 DIAGNOSIS — E875 Hyperkalemia: Secondary | ICD-10-CM | POA: Diagnosis not present

## 2023-06-21 DIAGNOSIS — D631 Anemia in chronic kidney disease: Secondary | ICD-10-CM | POA: Diagnosis not present

## 2023-06-21 DIAGNOSIS — N186 End stage renal disease: Secondary | ICD-10-CM | POA: Diagnosis not present

## 2023-06-21 DIAGNOSIS — D689 Coagulation defect, unspecified: Secondary | ICD-10-CM | POA: Diagnosis not present

## 2023-06-24 DIAGNOSIS — D631 Anemia in chronic kidney disease: Secondary | ICD-10-CM | POA: Diagnosis not present

## 2023-06-24 DIAGNOSIS — N186 End stage renal disease: Secondary | ICD-10-CM | POA: Diagnosis not present

## 2023-06-24 DIAGNOSIS — E875 Hyperkalemia: Secondary | ICD-10-CM | POA: Diagnosis not present

## 2023-06-24 DIAGNOSIS — E1122 Type 2 diabetes mellitus with diabetic chronic kidney disease: Secondary | ICD-10-CM | POA: Diagnosis not present

## 2023-06-24 DIAGNOSIS — D509 Iron deficiency anemia, unspecified: Secondary | ICD-10-CM | POA: Diagnosis not present

## 2023-06-24 DIAGNOSIS — N2581 Secondary hyperparathyroidism of renal origin: Secondary | ICD-10-CM | POA: Diagnosis not present

## 2023-06-24 DIAGNOSIS — D689 Coagulation defect, unspecified: Secondary | ICD-10-CM | POA: Diagnosis not present

## 2023-06-24 DIAGNOSIS — Z992 Dependence on renal dialysis: Secondary | ICD-10-CM | POA: Diagnosis not present

## 2023-06-26 DIAGNOSIS — E875 Hyperkalemia: Secondary | ICD-10-CM | POA: Diagnosis not present

## 2023-06-26 DIAGNOSIS — E1122 Type 2 diabetes mellitus with diabetic chronic kidney disease: Secondary | ICD-10-CM | POA: Diagnosis not present

## 2023-06-26 DIAGNOSIS — Z992 Dependence on renal dialysis: Secondary | ICD-10-CM | POA: Diagnosis not present

## 2023-06-26 DIAGNOSIS — D631 Anemia in chronic kidney disease: Secondary | ICD-10-CM | POA: Diagnosis not present

## 2023-06-26 DIAGNOSIS — D509 Iron deficiency anemia, unspecified: Secondary | ICD-10-CM | POA: Diagnosis not present

## 2023-06-26 DIAGNOSIS — N2581 Secondary hyperparathyroidism of renal origin: Secondary | ICD-10-CM | POA: Diagnosis not present

## 2023-06-26 DIAGNOSIS — N186 End stage renal disease: Secondary | ICD-10-CM | POA: Diagnosis not present

## 2023-06-26 DIAGNOSIS — D689 Coagulation defect, unspecified: Secondary | ICD-10-CM | POA: Diagnosis not present

## 2023-06-28 DIAGNOSIS — Z992 Dependence on renal dialysis: Secondary | ICD-10-CM | POA: Diagnosis not present

## 2023-06-28 DIAGNOSIS — D509 Iron deficiency anemia, unspecified: Secondary | ICD-10-CM | POA: Diagnosis not present

## 2023-06-28 DIAGNOSIS — N186 End stage renal disease: Secondary | ICD-10-CM | POA: Diagnosis not present

## 2023-06-28 DIAGNOSIS — D631 Anemia in chronic kidney disease: Secondary | ICD-10-CM | POA: Diagnosis not present

## 2023-06-28 DIAGNOSIS — E1122 Type 2 diabetes mellitus with diabetic chronic kidney disease: Secondary | ICD-10-CM | POA: Diagnosis not present

## 2023-06-28 DIAGNOSIS — N032 Chronic nephritic syndrome with diffuse membranous glomerulonephritis: Secondary | ICD-10-CM | POA: Diagnosis not present

## 2023-06-28 DIAGNOSIS — E875 Hyperkalemia: Secondary | ICD-10-CM | POA: Diagnosis not present

## 2023-06-28 DIAGNOSIS — N2581 Secondary hyperparathyroidism of renal origin: Secondary | ICD-10-CM | POA: Diagnosis not present

## 2023-06-28 DIAGNOSIS — D689 Coagulation defect, unspecified: Secondary | ICD-10-CM | POA: Diagnosis not present

## 2023-07-01 DIAGNOSIS — N2581 Secondary hyperparathyroidism of renal origin: Secondary | ICD-10-CM | POA: Diagnosis not present

## 2023-07-01 DIAGNOSIS — D631 Anemia in chronic kidney disease: Secondary | ICD-10-CM | POA: Diagnosis not present

## 2023-07-01 DIAGNOSIS — E875 Hyperkalemia: Secondary | ICD-10-CM | POA: Diagnosis not present

## 2023-07-01 DIAGNOSIS — D689 Coagulation defect, unspecified: Secondary | ICD-10-CM | POA: Diagnosis not present

## 2023-07-01 DIAGNOSIS — Z992 Dependence on renal dialysis: Secondary | ICD-10-CM | POA: Diagnosis not present

## 2023-07-01 DIAGNOSIS — D509 Iron deficiency anemia, unspecified: Secondary | ICD-10-CM | POA: Diagnosis not present

## 2023-07-01 DIAGNOSIS — N186 End stage renal disease: Secondary | ICD-10-CM | POA: Diagnosis not present

## 2023-07-01 DIAGNOSIS — E1122 Type 2 diabetes mellitus with diabetic chronic kidney disease: Secondary | ICD-10-CM | POA: Diagnosis not present

## 2023-07-03 DIAGNOSIS — N186 End stage renal disease: Secondary | ICD-10-CM | POA: Diagnosis not present

## 2023-07-03 DIAGNOSIS — N2581 Secondary hyperparathyroidism of renal origin: Secondary | ICD-10-CM | POA: Diagnosis not present

## 2023-07-03 DIAGNOSIS — D509 Iron deficiency anemia, unspecified: Secondary | ICD-10-CM | POA: Diagnosis not present

## 2023-07-03 DIAGNOSIS — E1122 Type 2 diabetes mellitus with diabetic chronic kidney disease: Secondary | ICD-10-CM | POA: Diagnosis not present

## 2023-07-03 DIAGNOSIS — D689 Coagulation defect, unspecified: Secondary | ICD-10-CM | POA: Diagnosis not present

## 2023-07-03 DIAGNOSIS — Z992 Dependence on renal dialysis: Secondary | ICD-10-CM | POA: Diagnosis not present

## 2023-07-03 DIAGNOSIS — E875 Hyperkalemia: Secondary | ICD-10-CM | POA: Diagnosis not present

## 2023-07-03 DIAGNOSIS — D631 Anemia in chronic kidney disease: Secondary | ICD-10-CM | POA: Diagnosis not present

## 2023-07-05 DIAGNOSIS — E875 Hyperkalemia: Secondary | ICD-10-CM | POA: Diagnosis not present

## 2023-07-05 DIAGNOSIS — D689 Coagulation defect, unspecified: Secondary | ICD-10-CM | POA: Diagnosis not present

## 2023-07-05 DIAGNOSIS — N186 End stage renal disease: Secondary | ICD-10-CM | POA: Diagnosis not present

## 2023-07-05 DIAGNOSIS — N2581 Secondary hyperparathyroidism of renal origin: Secondary | ICD-10-CM | POA: Diagnosis not present

## 2023-07-05 DIAGNOSIS — D631 Anemia in chronic kidney disease: Secondary | ICD-10-CM | POA: Diagnosis not present

## 2023-07-05 DIAGNOSIS — Z992 Dependence on renal dialysis: Secondary | ICD-10-CM | POA: Diagnosis not present

## 2023-07-05 DIAGNOSIS — E1122 Type 2 diabetes mellitus with diabetic chronic kidney disease: Secondary | ICD-10-CM | POA: Diagnosis not present

## 2023-07-05 DIAGNOSIS — D509 Iron deficiency anemia, unspecified: Secondary | ICD-10-CM | POA: Diagnosis not present

## 2023-07-08 DIAGNOSIS — N186 End stage renal disease: Secondary | ICD-10-CM | POA: Diagnosis not present

## 2023-07-08 DIAGNOSIS — N2581 Secondary hyperparathyroidism of renal origin: Secondary | ICD-10-CM | POA: Diagnosis not present

## 2023-07-08 DIAGNOSIS — E1122 Type 2 diabetes mellitus with diabetic chronic kidney disease: Secondary | ICD-10-CM | POA: Diagnosis not present

## 2023-07-08 DIAGNOSIS — D509 Iron deficiency anemia, unspecified: Secondary | ICD-10-CM | POA: Diagnosis not present

## 2023-07-08 DIAGNOSIS — E875 Hyperkalemia: Secondary | ICD-10-CM | POA: Diagnosis not present

## 2023-07-08 DIAGNOSIS — Z992 Dependence on renal dialysis: Secondary | ICD-10-CM | POA: Diagnosis not present

## 2023-07-08 DIAGNOSIS — D631 Anemia in chronic kidney disease: Secondary | ICD-10-CM | POA: Diagnosis not present

## 2023-07-08 DIAGNOSIS — D689 Coagulation defect, unspecified: Secondary | ICD-10-CM | POA: Diagnosis not present

## 2023-07-10 ENCOUNTER — Ambulatory Visit (INDEPENDENT_AMBULATORY_CARE_PROVIDER_SITE_OTHER): Payer: 59

## 2023-07-10 DIAGNOSIS — N2581 Secondary hyperparathyroidism of renal origin: Secondary | ICD-10-CM | POA: Diagnosis not present

## 2023-07-10 DIAGNOSIS — Z992 Dependence on renal dialysis: Secondary | ICD-10-CM | POA: Diagnosis not present

## 2023-07-10 DIAGNOSIS — E1122 Type 2 diabetes mellitus with diabetic chronic kidney disease: Secondary | ICD-10-CM | POA: Diagnosis not present

## 2023-07-10 DIAGNOSIS — D689 Coagulation defect, unspecified: Secondary | ICD-10-CM | POA: Diagnosis not present

## 2023-07-10 DIAGNOSIS — D631 Anemia in chronic kidney disease: Secondary | ICD-10-CM | POA: Diagnosis not present

## 2023-07-10 DIAGNOSIS — D509 Iron deficiency anemia, unspecified: Secondary | ICD-10-CM | POA: Diagnosis not present

## 2023-07-10 DIAGNOSIS — N186 End stage renal disease: Secondary | ICD-10-CM | POA: Diagnosis not present

## 2023-07-10 DIAGNOSIS — I255 Ischemic cardiomyopathy: Secondary | ICD-10-CM

## 2023-07-10 DIAGNOSIS — E875 Hyperkalemia: Secondary | ICD-10-CM | POA: Diagnosis not present

## 2023-07-10 LAB — CUP PACEART REMOTE DEVICE CHECK
Battery Remaining Longevity: 53 mo
Battery Voltage: 2.97 V
Brady Statistic RV Percent Paced: 0.01 %
Date Time Interrogation Session: 20240912012304
HighPow Impedance: 254 Ohm
HighPow Impedance: 65 Ohm
Implantable Lead Connection Status: 753985
Implantable Lead Implant Date: 20210315
Implantable Lead Location: 753860
Implantable Lead Model: 6935
Implantable Pulse Generator Implant Date: 20190208
Lead Channel Impedance Value: 304 Ohm
Lead Channel Impedance Value: 418 Ohm
Lead Channel Pacing Threshold Amplitude: 0.5 V
Lead Channel Pacing Threshold Pulse Width: 0.4 ms
Lead Channel Sensing Intrinsic Amplitude: 12.625 mV
Lead Channel Sensing Intrinsic Amplitude: 12.625 mV
Lead Channel Setting Pacing Amplitude: 2 V
Lead Channel Setting Pacing Pulse Width: 0.4 ms
Lead Channel Setting Sensing Sensitivity: 0.3 mV
Zone Setting Status: 755011

## 2023-07-12 DIAGNOSIS — D509 Iron deficiency anemia, unspecified: Secondary | ICD-10-CM | POA: Diagnosis not present

## 2023-07-12 DIAGNOSIS — E875 Hyperkalemia: Secondary | ICD-10-CM | POA: Diagnosis not present

## 2023-07-12 DIAGNOSIS — D631 Anemia in chronic kidney disease: Secondary | ICD-10-CM | POA: Diagnosis not present

## 2023-07-12 DIAGNOSIS — N186 End stage renal disease: Secondary | ICD-10-CM | POA: Diagnosis not present

## 2023-07-12 DIAGNOSIS — Z992 Dependence on renal dialysis: Secondary | ICD-10-CM | POA: Diagnosis not present

## 2023-07-12 DIAGNOSIS — D689 Coagulation defect, unspecified: Secondary | ICD-10-CM | POA: Diagnosis not present

## 2023-07-12 DIAGNOSIS — N2581 Secondary hyperparathyroidism of renal origin: Secondary | ICD-10-CM | POA: Diagnosis not present

## 2023-07-12 DIAGNOSIS — E1122 Type 2 diabetes mellitus with diabetic chronic kidney disease: Secondary | ICD-10-CM | POA: Diagnosis not present

## 2023-07-15 DIAGNOSIS — D689 Coagulation defect, unspecified: Secondary | ICD-10-CM | POA: Diagnosis not present

## 2023-07-15 DIAGNOSIS — D631 Anemia in chronic kidney disease: Secondary | ICD-10-CM | POA: Diagnosis not present

## 2023-07-15 DIAGNOSIS — D509 Iron deficiency anemia, unspecified: Secondary | ICD-10-CM | POA: Diagnosis not present

## 2023-07-15 DIAGNOSIS — N186 End stage renal disease: Secondary | ICD-10-CM | POA: Diagnosis not present

## 2023-07-15 DIAGNOSIS — E875 Hyperkalemia: Secondary | ICD-10-CM | POA: Diagnosis not present

## 2023-07-15 DIAGNOSIS — Z992 Dependence on renal dialysis: Secondary | ICD-10-CM | POA: Diagnosis not present

## 2023-07-15 DIAGNOSIS — N2581 Secondary hyperparathyroidism of renal origin: Secondary | ICD-10-CM | POA: Diagnosis not present

## 2023-07-15 DIAGNOSIS — E1122 Type 2 diabetes mellitus with diabetic chronic kidney disease: Secondary | ICD-10-CM | POA: Diagnosis not present

## 2023-07-17 DIAGNOSIS — D631 Anemia in chronic kidney disease: Secondary | ICD-10-CM | POA: Diagnosis not present

## 2023-07-17 DIAGNOSIS — Z992 Dependence on renal dialysis: Secondary | ICD-10-CM | POA: Diagnosis not present

## 2023-07-17 DIAGNOSIS — D509 Iron deficiency anemia, unspecified: Secondary | ICD-10-CM | POA: Diagnosis not present

## 2023-07-17 DIAGNOSIS — N186 End stage renal disease: Secondary | ICD-10-CM | POA: Diagnosis not present

## 2023-07-17 DIAGNOSIS — N2581 Secondary hyperparathyroidism of renal origin: Secondary | ICD-10-CM | POA: Diagnosis not present

## 2023-07-17 DIAGNOSIS — E875 Hyperkalemia: Secondary | ICD-10-CM | POA: Diagnosis not present

## 2023-07-17 DIAGNOSIS — E1122 Type 2 diabetes mellitus with diabetic chronic kidney disease: Secondary | ICD-10-CM | POA: Diagnosis not present

## 2023-07-17 DIAGNOSIS — D689 Coagulation defect, unspecified: Secondary | ICD-10-CM | POA: Diagnosis not present

## 2023-07-19 DIAGNOSIS — N2581 Secondary hyperparathyroidism of renal origin: Secondary | ICD-10-CM | POA: Diagnosis not present

## 2023-07-19 DIAGNOSIS — E1122 Type 2 diabetes mellitus with diabetic chronic kidney disease: Secondary | ICD-10-CM | POA: Diagnosis not present

## 2023-07-19 DIAGNOSIS — D509 Iron deficiency anemia, unspecified: Secondary | ICD-10-CM | POA: Diagnosis not present

## 2023-07-19 DIAGNOSIS — Z992 Dependence on renal dialysis: Secondary | ICD-10-CM | POA: Diagnosis not present

## 2023-07-19 DIAGNOSIS — D689 Coagulation defect, unspecified: Secondary | ICD-10-CM | POA: Diagnosis not present

## 2023-07-19 DIAGNOSIS — D631 Anemia in chronic kidney disease: Secondary | ICD-10-CM | POA: Diagnosis not present

## 2023-07-19 DIAGNOSIS — N186 End stage renal disease: Secondary | ICD-10-CM | POA: Diagnosis not present

## 2023-07-19 DIAGNOSIS — E875 Hyperkalemia: Secondary | ICD-10-CM | POA: Diagnosis not present

## 2023-07-22 ENCOUNTER — Other Ambulatory Visit: Payer: Self-pay | Admitting: Nephrology

## 2023-07-22 ENCOUNTER — Encounter: Payer: Self-pay | Admitting: Nephrology

## 2023-07-22 DIAGNOSIS — N2581 Secondary hyperparathyroidism of renal origin: Secondary | ICD-10-CM | POA: Diagnosis not present

## 2023-07-22 DIAGNOSIS — D631 Anemia in chronic kidney disease: Secondary | ICD-10-CM | POA: Diagnosis not present

## 2023-07-22 DIAGNOSIS — D751 Secondary polycythemia: Secondary | ICD-10-CM

## 2023-07-22 DIAGNOSIS — Z992 Dependence on renal dialysis: Secondary | ICD-10-CM | POA: Diagnosis not present

## 2023-07-22 DIAGNOSIS — E875 Hyperkalemia: Secondary | ICD-10-CM | POA: Diagnosis not present

## 2023-07-22 DIAGNOSIS — E1122 Type 2 diabetes mellitus with diabetic chronic kidney disease: Secondary | ICD-10-CM | POA: Diagnosis not present

## 2023-07-22 DIAGNOSIS — N186 End stage renal disease: Secondary | ICD-10-CM | POA: Diagnosis not present

## 2023-07-22 DIAGNOSIS — D509 Iron deficiency anemia, unspecified: Secondary | ICD-10-CM | POA: Diagnosis not present

## 2023-07-22 DIAGNOSIS — D689 Coagulation defect, unspecified: Secondary | ICD-10-CM | POA: Diagnosis not present

## 2023-07-22 NOTE — Progress Notes (Signed)
Remote ICD transmission.   

## 2023-07-24 DIAGNOSIS — N186 End stage renal disease: Secondary | ICD-10-CM | POA: Diagnosis not present

## 2023-07-24 DIAGNOSIS — D631 Anemia in chronic kidney disease: Secondary | ICD-10-CM | POA: Diagnosis not present

## 2023-07-24 DIAGNOSIS — D689 Coagulation defect, unspecified: Secondary | ICD-10-CM | POA: Diagnosis not present

## 2023-07-24 DIAGNOSIS — N2581 Secondary hyperparathyroidism of renal origin: Secondary | ICD-10-CM | POA: Diagnosis not present

## 2023-07-24 DIAGNOSIS — Z992 Dependence on renal dialysis: Secondary | ICD-10-CM | POA: Diagnosis not present

## 2023-07-24 DIAGNOSIS — E1122 Type 2 diabetes mellitus with diabetic chronic kidney disease: Secondary | ICD-10-CM | POA: Diagnosis not present

## 2023-07-24 DIAGNOSIS — E875 Hyperkalemia: Secondary | ICD-10-CM | POA: Diagnosis not present

## 2023-07-24 DIAGNOSIS — D509 Iron deficiency anemia, unspecified: Secondary | ICD-10-CM | POA: Diagnosis not present

## 2023-07-26 DIAGNOSIS — D509 Iron deficiency anemia, unspecified: Secondary | ICD-10-CM | POA: Diagnosis not present

## 2023-07-26 DIAGNOSIS — N2581 Secondary hyperparathyroidism of renal origin: Secondary | ICD-10-CM | POA: Diagnosis not present

## 2023-07-26 DIAGNOSIS — N186 End stage renal disease: Secondary | ICD-10-CM | POA: Diagnosis not present

## 2023-07-26 DIAGNOSIS — D631 Anemia in chronic kidney disease: Secondary | ICD-10-CM | POA: Diagnosis not present

## 2023-07-26 DIAGNOSIS — E875 Hyperkalemia: Secondary | ICD-10-CM | POA: Diagnosis not present

## 2023-07-26 DIAGNOSIS — D689 Coagulation defect, unspecified: Secondary | ICD-10-CM | POA: Diagnosis not present

## 2023-07-26 DIAGNOSIS — E1122 Type 2 diabetes mellitus with diabetic chronic kidney disease: Secondary | ICD-10-CM | POA: Diagnosis not present

## 2023-07-26 DIAGNOSIS — Z992 Dependence on renal dialysis: Secondary | ICD-10-CM | POA: Diagnosis not present

## 2023-07-27 DIAGNOSIS — N186 End stage renal disease: Secondary | ICD-10-CM | POA: Diagnosis not present

## 2023-07-28 DIAGNOSIS — N032 Chronic nephritic syndrome with diffuse membranous glomerulonephritis: Secondary | ICD-10-CM | POA: Diagnosis not present

## 2023-07-28 DIAGNOSIS — N186 End stage renal disease: Secondary | ICD-10-CM | POA: Diagnosis not present

## 2023-07-28 DIAGNOSIS — Z992 Dependence on renal dialysis: Secondary | ICD-10-CM | POA: Diagnosis not present

## 2023-07-29 DIAGNOSIS — D631 Anemia in chronic kidney disease: Secondary | ICD-10-CM | POA: Diagnosis not present

## 2023-07-29 DIAGNOSIS — N186 End stage renal disease: Secondary | ICD-10-CM | POA: Diagnosis not present

## 2023-07-29 DIAGNOSIS — Z992 Dependence on renal dialysis: Secondary | ICD-10-CM | POA: Diagnosis not present

## 2023-07-29 DIAGNOSIS — E875 Hyperkalemia: Secondary | ICD-10-CM | POA: Diagnosis not present

## 2023-07-29 DIAGNOSIS — D509 Iron deficiency anemia, unspecified: Secondary | ICD-10-CM | POA: Diagnosis not present

## 2023-07-29 DIAGNOSIS — E1122 Type 2 diabetes mellitus with diabetic chronic kidney disease: Secondary | ICD-10-CM | POA: Diagnosis not present

## 2023-07-29 DIAGNOSIS — D689 Coagulation defect, unspecified: Secondary | ICD-10-CM | POA: Diagnosis not present

## 2023-07-29 DIAGNOSIS — N2581 Secondary hyperparathyroidism of renal origin: Secondary | ICD-10-CM | POA: Diagnosis not present

## 2023-07-29 DIAGNOSIS — Z23 Encounter for immunization: Secondary | ICD-10-CM | POA: Diagnosis not present

## 2023-07-31 DIAGNOSIS — D689 Coagulation defect, unspecified: Secondary | ICD-10-CM | POA: Diagnosis not present

## 2023-07-31 DIAGNOSIS — D509 Iron deficiency anemia, unspecified: Secondary | ICD-10-CM | POA: Diagnosis not present

## 2023-07-31 DIAGNOSIS — Z992 Dependence on renal dialysis: Secondary | ICD-10-CM | POA: Diagnosis not present

## 2023-07-31 DIAGNOSIS — E875 Hyperkalemia: Secondary | ICD-10-CM | POA: Diagnosis not present

## 2023-07-31 DIAGNOSIS — E1122 Type 2 diabetes mellitus with diabetic chronic kidney disease: Secondary | ICD-10-CM | POA: Diagnosis not present

## 2023-07-31 DIAGNOSIS — D631 Anemia in chronic kidney disease: Secondary | ICD-10-CM | POA: Diagnosis not present

## 2023-07-31 DIAGNOSIS — N2581 Secondary hyperparathyroidism of renal origin: Secondary | ICD-10-CM | POA: Diagnosis not present

## 2023-07-31 DIAGNOSIS — N186 End stage renal disease: Secondary | ICD-10-CM | POA: Diagnosis not present

## 2023-07-31 DIAGNOSIS — Z23 Encounter for immunization: Secondary | ICD-10-CM | POA: Diagnosis not present

## 2023-08-02 DIAGNOSIS — D689 Coagulation defect, unspecified: Secondary | ICD-10-CM | POA: Diagnosis not present

## 2023-08-02 DIAGNOSIS — D631 Anemia in chronic kidney disease: Secondary | ICD-10-CM | POA: Diagnosis not present

## 2023-08-02 DIAGNOSIS — E1122 Type 2 diabetes mellitus with diabetic chronic kidney disease: Secondary | ICD-10-CM | POA: Diagnosis not present

## 2023-08-02 DIAGNOSIS — D509 Iron deficiency anemia, unspecified: Secondary | ICD-10-CM | POA: Diagnosis not present

## 2023-08-02 DIAGNOSIS — E875 Hyperkalemia: Secondary | ICD-10-CM | POA: Diagnosis not present

## 2023-08-02 DIAGNOSIS — N186 End stage renal disease: Secondary | ICD-10-CM | POA: Diagnosis not present

## 2023-08-02 DIAGNOSIS — Z23 Encounter for immunization: Secondary | ICD-10-CM | POA: Diagnosis not present

## 2023-08-02 DIAGNOSIS — Z992 Dependence on renal dialysis: Secondary | ICD-10-CM | POA: Diagnosis not present

## 2023-08-02 DIAGNOSIS — N2581 Secondary hyperparathyroidism of renal origin: Secondary | ICD-10-CM | POA: Diagnosis not present

## 2023-08-05 DIAGNOSIS — E1122 Type 2 diabetes mellitus with diabetic chronic kidney disease: Secondary | ICD-10-CM | POA: Diagnosis not present

## 2023-08-05 DIAGNOSIS — Z23 Encounter for immunization: Secondary | ICD-10-CM | POA: Diagnosis not present

## 2023-08-05 DIAGNOSIS — Z992 Dependence on renal dialysis: Secondary | ICD-10-CM | POA: Diagnosis not present

## 2023-08-05 DIAGNOSIS — N2581 Secondary hyperparathyroidism of renal origin: Secondary | ICD-10-CM | POA: Diagnosis not present

## 2023-08-05 DIAGNOSIS — D509 Iron deficiency anemia, unspecified: Secondary | ICD-10-CM | POA: Diagnosis not present

## 2023-08-05 DIAGNOSIS — E875 Hyperkalemia: Secondary | ICD-10-CM | POA: Diagnosis not present

## 2023-08-05 DIAGNOSIS — D689 Coagulation defect, unspecified: Secondary | ICD-10-CM | POA: Diagnosis not present

## 2023-08-05 DIAGNOSIS — D631 Anemia in chronic kidney disease: Secondary | ICD-10-CM | POA: Diagnosis not present

## 2023-08-05 DIAGNOSIS — N186 End stage renal disease: Secondary | ICD-10-CM | POA: Diagnosis not present

## 2023-08-07 DIAGNOSIS — N186 End stage renal disease: Secondary | ICD-10-CM | POA: Diagnosis not present

## 2023-08-07 DIAGNOSIS — D631 Anemia in chronic kidney disease: Secondary | ICD-10-CM | POA: Diagnosis not present

## 2023-08-07 DIAGNOSIS — D689 Coagulation defect, unspecified: Secondary | ICD-10-CM | POA: Diagnosis not present

## 2023-08-07 DIAGNOSIS — D509 Iron deficiency anemia, unspecified: Secondary | ICD-10-CM | POA: Diagnosis not present

## 2023-08-07 DIAGNOSIS — E1122 Type 2 diabetes mellitus with diabetic chronic kidney disease: Secondary | ICD-10-CM | POA: Diagnosis not present

## 2023-08-07 DIAGNOSIS — Z23 Encounter for immunization: Secondary | ICD-10-CM | POA: Diagnosis not present

## 2023-08-07 DIAGNOSIS — E875 Hyperkalemia: Secondary | ICD-10-CM | POA: Diagnosis not present

## 2023-08-07 DIAGNOSIS — N2581 Secondary hyperparathyroidism of renal origin: Secondary | ICD-10-CM | POA: Diagnosis not present

## 2023-08-07 DIAGNOSIS — Z992 Dependence on renal dialysis: Secondary | ICD-10-CM | POA: Diagnosis not present

## 2023-08-09 DIAGNOSIS — Z992 Dependence on renal dialysis: Secondary | ICD-10-CM | POA: Diagnosis not present

## 2023-08-09 DIAGNOSIS — N186 End stage renal disease: Secondary | ICD-10-CM | POA: Diagnosis not present

## 2023-08-09 DIAGNOSIS — E1122 Type 2 diabetes mellitus with diabetic chronic kidney disease: Secondary | ICD-10-CM | POA: Diagnosis not present

## 2023-08-09 DIAGNOSIS — Z23 Encounter for immunization: Secondary | ICD-10-CM | POA: Diagnosis not present

## 2023-08-09 DIAGNOSIS — D509 Iron deficiency anemia, unspecified: Secondary | ICD-10-CM | POA: Diagnosis not present

## 2023-08-09 DIAGNOSIS — E875 Hyperkalemia: Secondary | ICD-10-CM | POA: Diagnosis not present

## 2023-08-09 DIAGNOSIS — N2581 Secondary hyperparathyroidism of renal origin: Secondary | ICD-10-CM | POA: Diagnosis not present

## 2023-08-09 DIAGNOSIS — D689 Coagulation defect, unspecified: Secondary | ICD-10-CM | POA: Diagnosis not present

## 2023-08-09 DIAGNOSIS — D631 Anemia in chronic kidney disease: Secondary | ICD-10-CM | POA: Diagnosis not present

## 2023-08-12 DIAGNOSIS — D509 Iron deficiency anemia, unspecified: Secondary | ICD-10-CM | POA: Diagnosis not present

## 2023-08-12 DIAGNOSIS — D689 Coagulation defect, unspecified: Secondary | ICD-10-CM | POA: Diagnosis not present

## 2023-08-12 DIAGNOSIS — E875 Hyperkalemia: Secondary | ICD-10-CM | POA: Diagnosis not present

## 2023-08-12 DIAGNOSIS — N186 End stage renal disease: Secondary | ICD-10-CM | POA: Diagnosis not present

## 2023-08-12 DIAGNOSIS — D631 Anemia in chronic kidney disease: Secondary | ICD-10-CM | POA: Diagnosis not present

## 2023-08-12 DIAGNOSIS — N2581 Secondary hyperparathyroidism of renal origin: Secondary | ICD-10-CM | POA: Diagnosis not present

## 2023-08-12 DIAGNOSIS — E1122 Type 2 diabetes mellitus with diabetic chronic kidney disease: Secondary | ICD-10-CM | POA: Diagnosis not present

## 2023-08-12 DIAGNOSIS — Z992 Dependence on renal dialysis: Secondary | ICD-10-CM | POA: Diagnosis not present

## 2023-08-12 DIAGNOSIS — Z23 Encounter for immunization: Secondary | ICD-10-CM | POA: Diagnosis not present

## 2023-08-14 DIAGNOSIS — E1122 Type 2 diabetes mellitus with diabetic chronic kidney disease: Secondary | ICD-10-CM | POA: Diagnosis not present

## 2023-08-14 DIAGNOSIS — D631 Anemia in chronic kidney disease: Secondary | ICD-10-CM | POA: Diagnosis not present

## 2023-08-14 DIAGNOSIS — Z992 Dependence on renal dialysis: Secondary | ICD-10-CM | POA: Diagnosis not present

## 2023-08-14 DIAGNOSIS — Z23 Encounter for immunization: Secondary | ICD-10-CM | POA: Diagnosis not present

## 2023-08-14 DIAGNOSIS — E875 Hyperkalemia: Secondary | ICD-10-CM | POA: Diagnosis not present

## 2023-08-14 DIAGNOSIS — D509 Iron deficiency anemia, unspecified: Secondary | ICD-10-CM | POA: Diagnosis not present

## 2023-08-14 DIAGNOSIS — N186 End stage renal disease: Secondary | ICD-10-CM | POA: Diagnosis not present

## 2023-08-14 DIAGNOSIS — D689 Coagulation defect, unspecified: Secondary | ICD-10-CM | POA: Diagnosis not present

## 2023-08-14 DIAGNOSIS — N2581 Secondary hyperparathyroidism of renal origin: Secondary | ICD-10-CM | POA: Diagnosis not present

## 2023-08-16 DIAGNOSIS — N186 End stage renal disease: Secondary | ICD-10-CM | POA: Diagnosis not present

## 2023-08-16 DIAGNOSIS — Z992 Dependence on renal dialysis: Secondary | ICD-10-CM | POA: Diagnosis not present

## 2023-08-16 DIAGNOSIS — Z23 Encounter for immunization: Secondary | ICD-10-CM | POA: Diagnosis not present

## 2023-08-16 DIAGNOSIS — N2581 Secondary hyperparathyroidism of renal origin: Secondary | ICD-10-CM | POA: Diagnosis not present

## 2023-08-16 DIAGNOSIS — D689 Coagulation defect, unspecified: Secondary | ICD-10-CM | POA: Diagnosis not present

## 2023-08-16 DIAGNOSIS — D509 Iron deficiency anemia, unspecified: Secondary | ICD-10-CM | POA: Diagnosis not present

## 2023-08-16 DIAGNOSIS — E1122 Type 2 diabetes mellitus with diabetic chronic kidney disease: Secondary | ICD-10-CM | POA: Diagnosis not present

## 2023-08-16 DIAGNOSIS — D631 Anemia in chronic kidney disease: Secondary | ICD-10-CM | POA: Diagnosis not present

## 2023-08-16 DIAGNOSIS — E875 Hyperkalemia: Secondary | ICD-10-CM | POA: Diagnosis not present

## 2023-08-18 ENCOUNTER — Ambulatory Visit
Admission: RE | Admit: 2023-08-18 | Discharge: 2023-08-18 | Disposition: A | Discharge status: Home / Self Care | Payer: 59 | Source: Ambulatory Visit | Attending: Nephrology | Admitting: Nephrology

## 2023-08-18 DIAGNOSIS — I2510 Atherosclerotic heart disease of native coronary artery without angina pectoris: Secondary | ICD-10-CM | POA: Diagnosis not present

## 2023-08-18 DIAGNOSIS — D751 Secondary polycythemia: Secondary | ICD-10-CM

## 2023-08-18 DIAGNOSIS — R1010 Upper abdominal pain, unspecified: Secondary | ICD-10-CM | POA: Diagnosis not present

## 2023-08-18 DIAGNOSIS — I5022 Chronic systolic (congestive) heart failure: Secondary | ICD-10-CM | POA: Diagnosis not present

## 2023-08-18 DIAGNOSIS — K5730 Diverticulosis of large intestine without perforation or abscess without bleeding: Secondary | ICD-10-CM | POA: Diagnosis not present

## 2023-08-18 DIAGNOSIS — R911 Solitary pulmonary nodule: Secondary | ICD-10-CM | POA: Diagnosis not present

## 2023-08-18 DIAGNOSIS — N200 Calculus of kidney: Secondary | ICD-10-CM | POA: Diagnosis not present

## 2023-08-18 DIAGNOSIS — I10 Essential (primary) hypertension: Secondary | ICD-10-CM | POA: Diagnosis not present

## 2023-08-18 MED ORDER — IOPAMIDOL (ISOVUE-300) INJECTION 61%
100.0000 mL | Freq: Once | INTRAVENOUS | Status: AC | PRN
  Administered 2023-08-18: 100 mL via INTRAVENOUS

## 2023-08-19 DIAGNOSIS — N2581 Secondary hyperparathyroidism of renal origin: Secondary | ICD-10-CM | POA: Diagnosis not present

## 2023-08-19 DIAGNOSIS — D689 Coagulation defect, unspecified: Secondary | ICD-10-CM | POA: Diagnosis not present

## 2023-08-19 DIAGNOSIS — E875 Hyperkalemia: Secondary | ICD-10-CM | POA: Diagnosis not present

## 2023-08-19 DIAGNOSIS — D509 Iron deficiency anemia, unspecified: Secondary | ICD-10-CM | POA: Diagnosis not present

## 2023-08-19 DIAGNOSIS — D631 Anemia in chronic kidney disease: Secondary | ICD-10-CM | POA: Diagnosis not present

## 2023-08-19 DIAGNOSIS — Z23 Encounter for immunization: Secondary | ICD-10-CM | POA: Diagnosis not present

## 2023-08-19 DIAGNOSIS — E1122 Type 2 diabetes mellitus with diabetic chronic kidney disease: Secondary | ICD-10-CM | POA: Diagnosis not present

## 2023-08-19 DIAGNOSIS — N186 End stage renal disease: Secondary | ICD-10-CM | POA: Diagnosis not present

## 2023-08-19 DIAGNOSIS — Z992 Dependence on renal dialysis: Secondary | ICD-10-CM | POA: Diagnosis not present

## 2023-08-21 DIAGNOSIS — D509 Iron deficiency anemia, unspecified: Secondary | ICD-10-CM | POA: Diagnosis not present

## 2023-08-21 DIAGNOSIS — N186 End stage renal disease: Secondary | ICD-10-CM | POA: Diagnosis not present

## 2023-08-21 DIAGNOSIS — D689 Coagulation defect, unspecified: Secondary | ICD-10-CM | POA: Diagnosis not present

## 2023-08-21 DIAGNOSIS — E875 Hyperkalemia: Secondary | ICD-10-CM | POA: Diagnosis not present

## 2023-08-21 DIAGNOSIS — D631 Anemia in chronic kidney disease: Secondary | ICD-10-CM | POA: Diagnosis not present

## 2023-08-21 DIAGNOSIS — Z992 Dependence on renal dialysis: Secondary | ICD-10-CM | POA: Diagnosis not present

## 2023-08-21 DIAGNOSIS — E1122 Type 2 diabetes mellitus with diabetic chronic kidney disease: Secondary | ICD-10-CM | POA: Diagnosis not present

## 2023-08-21 DIAGNOSIS — N2581 Secondary hyperparathyroidism of renal origin: Secondary | ICD-10-CM | POA: Diagnosis not present

## 2023-08-21 DIAGNOSIS — Z23 Encounter for immunization: Secondary | ICD-10-CM | POA: Diagnosis not present

## 2023-08-23 DIAGNOSIS — Z992 Dependence on renal dialysis: Secondary | ICD-10-CM | POA: Diagnosis not present

## 2023-08-23 DIAGNOSIS — N186 End stage renal disease: Secondary | ICD-10-CM | POA: Diagnosis not present

## 2023-08-23 DIAGNOSIS — E1122 Type 2 diabetes mellitus with diabetic chronic kidney disease: Secondary | ICD-10-CM | POA: Diagnosis not present

## 2023-08-23 DIAGNOSIS — N2581 Secondary hyperparathyroidism of renal origin: Secondary | ICD-10-CM | POA: Diagnosis not present

## 2023-08-23 DIAGNOSIS — D509 Iron deficiency anemia, unspecified: Secondary | ICD-10-CM | POA: Diagnosis not present

## 2023-08-23 DIAGNOSIS — E875 Hyperkalemia: Secondary | ICD-10-CM | POA: Diagnosis not present

## 2023-08-23 DIAGNOSIS — D631 Anemia in chronic kidney disease: Secondary | ICD-10-CM | POA: Diagnosis not present

## 2023-08-23 DIAGNOSIS — D689 Coagulation defect, unspecified: Secondary | ICD-10-CM | POA: Diagnosis not present

## 2023-08-23 DIAGNOSIS — Z23 Encounter for immunization: Secondary | ICD-10-CM | POA: Diagnosis not present

## 2023-08-26 DIAGNOSIS — N2581 Secondary hyperparathyroidism of renal origin: Secondary | ICD-10-CM | POA: Diagnosis not present

## 2023-08-26 DIAGNOSIS — N186 End stage renal disease: Secondary | ICD-10-CM | POA: Diagnosis not present

## 2023-08-26 DIAGNOSIS — Z992 Dependence on renal dialysis: Secondary | ICD-10-CM | POA: Diagnosis not present

## 2023-08-26 DIAGNOSIS — Z23 Encounter for immunization: Secondary | ICD-10-CM | POA: Diagnosis not present

## 2023-08-26 DIAGNOSIS — E875 Hyperkalemia: Secondary | ICD-10-CM | POA: Diagnosis not present

## 2023-08-26 DIAGNOSIS — D689 Coagulation defect, unspecified: Secondary | ICD-10-CM | POA: Diagnosis not present

## 2023-08-26 DIAGNOSIS — E1122 Type 2 diabetes mellitus with diabetic chronic kidney disease: Secondary | ICD-10-CM | POA: Diagnosis not present

## 2023-08-26 DIAGNOSIS — D509 Iron deficiency anemia, unspecified: Secondary | ICD-10-CM | POA: Diagnosis not present

## 2023-08-26 DIAGNOSIS — D631 Anemia in chronic kidney disease: Secondary | ICD-10-CM | POA: Diagnosis not present

## 2023-08-28 DIAGNOSIS — E875 Hyperkalemia: Secondary | ICD-10-CM | POA: Diagnosis not present

## 2023-08-28 DIAGNOSIS — N2581 Secondary hyperparathyroidism of renal origin: Secondary | ICD-10-CM | POA: Diagnosis not present

## 2023-08-28 DIAGNOSIS — N186 End stage renal disease: Secondary | ICD-10-CM | POA: Diagnosis not present

## 2023-08-28 DIAGNOSIS — N032 Chronic nephritic syndrome with diffuse membranous glomerulonephritis: Secondary | ICD-10-CM | POA: Diagnosis not present

## 2023-08-28 DIAGNOSIS — E1122 Type 2 diabetes mellitus with diabetic chronic kidney disease: Secondary | ICD-10-CM | POA: Diagnosis not present

## 2023-08-28 DIAGNOSIS — Z23 Encounter for immunization: Secondary | ICD-10-CM | POA: Diagnosis not present

## 2023-08-28 DIAGNOSIS — D689 Coagulation defect, unspecified: Secondary | ICD-10-CM | POA: Diagnosis not present

## 2023-08-28 DIAGNOSIS — D631 Anemia in chronic kidney disease: Secondary | ICD-10-CM | POA: Diagnosis not present

## 2023-08-28 DIAGNOSIS — Z992 Dependence on renal dialysis: Secondary | ICD-10-CM | POA: Diagnosis not present

## 2023-08-28 DIAGNOSIS — D509 Iron deficiency anemia, unspecified: Secondary | ICD-10-CM | POA: Diagnosis not present

## 2023-08-30 DIAGNOSIS — D689 Coagulation defect, unspecified: Secondary | ICD-10-CM | POA: Diagnosis not present

## 2023-08-30 DIAGNOSIS — E875 Hyperkalemia: Secondary | ICD-10-CM | POA: Diagnosis not present

## 2023-08-30 DIAGNOSIS — E1122 Type 2 diabetes mellitus with diabetic chronic kidney disease: Secondary | ICD-10-CM | POA: Diagnosis not present

## 2023-08-30 DIAGNOSIS — N186 End stage renal disease: Secondary | ICD-10-CM | POA: Diagnosis not present

## 2023-08-30 DIAGNOSIS — N2581 Secondary hyperparathyroidism of renal origin: Secondary | ICD-10-CM | POA: Diagnosis not present

## 2023-08-30 DIAGNOSIS — D509 Iron deficiency anemia, unspecified: Secondary | ICD-10-CM | POA: Diagnosis not present

## 2023-08-30 DIAGNOSIS — Z992 Dependence on renal dialysis: Secondary | ICD-10-CM | POA: Diagnosis not present

## 2023-08-30 DIAGNOSIS — D631 Anemia in chronic kidney disease: Secondary | ICD-10-CM | POA: Diagnosis not present

## 2023-09-02 DIAGNOSIS — D631 Anemia in chronic kidney disease: Secondary | ICD-10-CM | POA: Diagnosis not present

## 2023-09-02 DIAGNOSIS — N186 End stage renal disease: Secondary | ICD-10-CM | POA: Diagnosis not present

## 2023-09-02 DIAGNOSIS — N2581 Secondary hyperparathyroidism of renal origin: Secondary | ICD-10-CM | POA: Diagnosis not present

## 2023-09-02 DIAGNOSIS — D509 Iron deficiency anemia, unspecified: Secondary | ICD-10-CM | POA: Diagnosis not present

## 2023-09-02 DIAGNOSIS — E875 Hyperkalemia: Secondary | ICD-10-CM | POA: Diagnosis not present

## 2023-09-02 DIAGNOSIS — Z992 Dependence on renal dialysis: Secondary | ICD-10-CM | POA: Diagnosis not present

## 2023-09-02 DIAGNOSIS — D689 Coagulation defect, unspecified: Secondary | ICD-10-CM | POA: Diagnosis not present

## 2023-09-02 DIAGNOSIS — E1122 Type 2 diabetes mellitus with diabetic chronic kidney disease: Secondary | ICD-10-CM | POA: Diagnosis not present

## 2023-09-04 DIAGNOSIS — Z992 Dependence on renal dialysis: Secondary | ICD-10-CM | POA: Diagnosis not present

## 2023-09-04 DIAGNOSIS — D689 Coagulation defect, unspecified: Secondary | ICD-10-CM | POA: Diagnosis not present

## 2023-09-04 DIAGNOSIS — D631 Anemia in chronic kidney disease: Secondary | ICD-10-CM | POA: Diagnosis not present

## 2023-09-04 DIAGNOSIS — E875 Hyperkalemia: Secondary | ICD-10-CM | POA: Diagnosis not present

## 2023-09-04 DIAGNOSIS — D509 Iron deficiency anemia, unspecified: Secondary | ICD-10-CM | POA: Diagnosis not present

## 2023-09-04 DIAGNOSIS — N2581 Secondary hyperparathyroidism of renal origin: Secondary | ICD-10-CM | POA: Diagnosis not present

## 2023-09-04 DIAGNOSIS — E1122 Type 2 diabetes mellitus with diabetic chronic kidney disease: Secondary | ICD-10-CM | POA: Diagnosis not present

## 2023-09-04 DIAGNOSIS — N186 End stage renal disease: Secondary | ICD-10-CM | POA: Diagnosis not present

## 2023-09-06 DIAGNOSIS — D631 Anemia in chronic kidney disease: Secondary | ICD-10-CM | POA: Diagnosis not present

## 2023-09-06 DIAGNOSIS — N186 End stage renal disease: Secondary | ICD-10-CM | POA: Diagnosis not present

## 2023-09-06 DIAGNOSIS — E875 Hyperkalemia: Secondary | ICD-10-CM | POA: Diagnosis not present

## 2023-09-06 DIAGNOSIS — D689 Coagulation defect, unspecified: Secondary | ICD-10-CM | POA: Diagnosis not present

## 2023-09-06 DIAGNOSIS — E1122 Type 2 diabetes mellitus with diabetic chronic kidney disease: Secondary | ICD-10-CM | POA: Diagnosis not present

## 2023-09-06 DIAGNOSIS — D509 Iron deficiency anemia, unspecified: Secondary | ICD-10-CM | POA: Diagnosis not present

## 2023-09-06 DIAGNOSIS — N2581 Secondary hyperparathyroidism of renal origin: Secondary | ICD-10-CM | POA: Diagnosis not present

## 2023-09-06 DIAGNOSIS — Z992 Dependence on renal dialysis: Secondary | ICD-10-CM | POA: Diagnosis not present

## 2023-09-09 DIAGNOSIS — D631 Anemia in chronic kidney disease: Secondary | ICD-10-CM | POA: Diagnosis not present

## 2023-09-09 DIAGNOSIS — E1122 Type 2 diabetes mellitus with diabetic chronic kidney disease: Secondary | ICD-10-CM | POA: Diagnosis not present

## 2023-09-09 DIAGNOSIS — D509 Iron deficiency anemia, unspecified: Secondary | ICD-10-CM | POA: Diagnosis not present

## 2023-09-09 DIAGNOSIS — Z992 Dependence on renal dialysis: Secondary | ICD-10-CM | POA: Diagnosis not present

## 2023-09-09 DIAGNOSIS — D689 Coagulation defect, unspecified: Secondary | ICD-10-CM | POA: Diagnosis not present

## 2023-09-09 DIAGNOSIS — N2581 Secondary hyperparathyroidism of renal origin: Secondary | ICD-10-CM | POA: Diagnosis not present

## 2023-09-09 DIAGNOSIS — E875 Hyperkalemia: Secondary | ICD-10-CM | POA: Diagnosis not present

## 2023-09-09 DIAGNOSIS — N186 End stage renal disease: Secondary | ICD-10-CM | POA: Diagnosis not present

## 2023-09-11 DIAGNOSIS — D689 Coagulation defect, unspecified: Secondary | ICD-10-CM | POA: Diagnosis not present

## 2023-09-11 DIAGNOSIS — D509 Iron deficiency anemia, unspecified: Secondary | ICD-10-CM | POA: Diagnosis not present

## 2023-09-11 DIAGNOSIS — E1122 Type 2 diabetes mellitus with diabetic chronic kidney disease: Secondary | ICD-10-CM | POA: Diagnosis not present

## 2023-09-11 DIAGNOSIS — N186 End stage renal disease: Secondary | ICD-10-CM | POA: Diagnosis not present

## 2023-09-11 DIAGNOSIS — Z992 Dependence on renal dialysis: Secondary | ICD-10-CM | POA: Diagnosis not present

## 2023-09-11 DIAGNOSIS — E875 Hyperkalemia: Secondary | ICD-10-CM | POA: Diagnosis not present

## 2023-09-11 DIAGNOSIS — D631 Anemia in chronic kidney disease: Secondary | ICD-10-CM | POA: Diagnosis not present

## 2023-09-11 DIAGNOSIS — N2581 Secondary hyperparathyroidism of renal origin: Secondary | ICD-10-CM | POA: Diagnosis not present

## 2023-09-13 DIAGNOSIS — Z992 Dependence on renal dialysis: Secondary | ICD-10-CM | POA: Diagnosis not present

## 2023-09-13 DIAGNOSIS — N2581 Secondary hyperparathyroidism of renal origin: Secondary | ICD-10-CM | POA: Diagnosis not present

## 2023-09-13 DIAGNOSIS — E875 Hyperkalemia: Secondary | ICD-10-CM | POA: Diagnosis not present

## 2023-09-13 DIAGNOSIS — D631 Anemia in chronic kidney disease: Secondary | ICD-10-CM | POA: Diagnosis not present

## 2023-09-13 DIAGNOSIS — N186 End stage renal disease: Secondary | ICD-10-CM | POA: Diagnosis not present

## 2023-09-13 DIAGNOSIS — D689 Coagulation defect, unspecified: Secondary | ICD-10-CM | POA: Diagnosis not present

## 2023-09-13 DIAGNOSIS — D509 Iron deficiency anemia, unspecified: Secondary | ICD-10-CM | POA: Diagnosis not present

## 2023-09-13 DIAGNOSIS — E1122 Type 2 diabetes mellitus with diabetic chronic kidney disease: Secondary | ICD-10-CM | POA: Diagnosis not present

## 2023-09-16 DIAGNOSIS — D689 Coagulation defect, unspecified: Secondary | ICD-10-CM | POA: Diagnosis not present

## 2023-09-16 DIAGNOSIS — Z992 Dependence on renal dialysis: Secondary | ICD-10-CM | POA: Diagnosis not present

## 2023-09-16 DIAGNOSIS — N2581 Secondary hyperparathyroidism of renal origin: Secondary | ICD-10-CM | POA: Diagnosis not present

## 2023-09-16 DIAGNOSIS — N186 End stage renal disease: Secondary | ICD-10-CM | POA: Diagnosis not present

## 2023-09-16 DIAGNOSIS — D631 Anemia in chronic kidney disease: Secondary | ICD-10-CM | POA: Diagnosis not present

## 2023-09-16 DIAGNOSIS — E875 Hyperkalemia: Secondary | ICD-10-CM | POA: Diagnosis not present

## 2023-09-16 DIAGNOSIS — D509 Iron deficiency anemia, unspecified: Secondary | ICD-10-CM | POA: Diagnosis not present

## 2023-09-16 DIAGNOSIS — E1122 Type 2 diabetes mellitus with diabetic chronic kidney disease: Secondary | ICD-10-CM | POA: Diagnosis not present

## 2023-09-18 DIAGNOSIS — D509 Iron deficiency anemia, unspecified: Secondary | ICD-10-CM | POA: Diagnosis not present

## 2023-09-18 DIAGNOSIS — D689 Coagulation defect, unspecified: Secondary | ICD-10-CM | POA: Diagnosis not present

## 2023-09-18 DIAGNOSIS — Z992 Dependence on renal dialysis: Secondary | ICD-10-CM | POA: Diagnosis not present

## 2023-09-18 DIAGNOSIS — N2581 Secondary hyperparathyroidism of renal origin: Secondary | ICD-10-CM | POA: Diagnosis not present

## 2023-09-18 DIAGNOSIS — D631 Anemia in chronic kidney disease: Secondary | ICD-10-CM | POA: Diagnosis not present

## 2023-09-18 DIAGNOSIS — E1122 Type 2 diabetes mellitus with diabetic chronic kidney disease: Secondary | ICD-10-CM | POA: Diagnosis not present

## 2023-09-18 DIAGNOSIS — E875 Hyperkalemia: Secondary | ICD-10-CM | POA: Diagnosis not present

## 2023-09-18 DIAGNOSIS — N186 End stage renal disease: Secondary | ICD-10-CM | POA: Diagnosis not present

## 2023-09-20 DIAGNOSIS — D689 Coagulation defect, unspecified: Secondary | ICD-10-CM | POA: Diagnosis not present

## 2023-09-20 DIAGNOSIS — Z992 Dependence on renal dialysis: Secondary | ICD-10-CM | POA: Diagnosis not present

## 2023-09-20 DIAGNOSIS — E875 Hyperkalemia: Secondary | ICD-10-CM | POA: Diagnosis not present

## 2023-09-20 DIAGNOSIS — N2581 Secondary hyperparathyroidism of renal origin: Secondary | ICD-10-CM | POA: Diagnosis not present

## 2023-09-20 DIAGNOSIS — D631 Anemia in chronic kidney disease: Secondary | ICD-10-CM | POA: Diagnosis not present

## 2023-09-20 DIAGNOSIS — D509 Iron deficiency anemia, unspecified: Secondary | ICD-10-CM | POA: Diagnosis not present

## 2023-09-20 DIAGNOSIS — N186 End stage renal disease: Secondary | ICD-10-CM | POA: Diagnosis not present

## 2023-09-20 DIAGNOSIS — E1122 Type 2 diabetes mellitus with diabetic chronic kidney disease: Secondary | ICD-10-CM | POA: Diagnosis not present

## 2023-09-23 DIAGNOSIS — N186 End stage renal disease: Secondary | ICD-10-CM | POA: Diagnosis not present

## 2023-09-23 DIAGNOSIS — E1122 Type 2 diabetes mellitus with diabetic chronic kidney disease: Secondary | ICD-10-CM | POA: Diagnosis not present

## 2023-09-23 DIAGNOSIS — D631 Anemia in chronic kidney disease: Secondary | ICD-10-CM | POA: Diagnosis not present

## 2023-09-23 DIAGNOSIS — D689 Coagulation defect, unspecified: Secondary | ICD-10-CM | POA: Diagnosis not present

## 2023-09-23 DIAGNOSIS — E875 Hyperkalemia: Secondary | ICD-10-CM | POA: Diagnosis not present

## 2023-09-23 DIAGNOSIS — N2581 Secondary hyperparathyroidism of renal origin: Secondary | ICD-10-CM | POA: Diagnosis not present

## 2023-09-23 DIAGNOSIS — Z992 Dependence on renal dialysis: Secondary | ICD-10-CM | POA: Diagnosis not present

## 2023-09-23 DIAGNOSIS — D509 Iron deficiency anemia, unspecified: Secondary | ICD-10-CM | POA: Diagnosis not present

## 2023-09-25 DIAGNOSIS — E875 Hyperkalemia: Secondary | ICD-10-CM | POA: Diagnosis not present

## 2023-09-25 DIAGNOSIS — Z992 Dependence on renal dialysis: Secondary | ICD-10-CM | POA: Diagnosis not present

## 2023-09-25 DIAGNOSIS — D631 Anemia in chronic kidney disease: Secondary | ICD-10-CM | POA: Diagnosis not present

## 2023-09-25 DIAGNOSIS — N2581 Secondary hyperparathyroidism of renal origin: Secondary | ICD-10-CM | POA: Diagnosis not present

## 2023-09-25 DIAGNOSIS — D509 Iron deficiency anemia, unspecified: Secondary | ICD-10-CM | POA: Diagnosis not present

## 2023-09-25 DIAGNOSIS — E1122 Type 2 diabetes mellitus with diabetic chronic kidney disease: Secondary | ICD-10-CM | POA: Diagnosis not present

## 2023-09-25 DIAGNOSIS — N186 End stage renal disease: Secondary | ICD-10-CM | POA: Diagnosis not present

## 2023-09-25 DIAGNOSIS — D689 Coagulation defect, unspecified: Secondary | ICD-10-CM | POA: Diagnosis not present

## 2023-09-27 DIAGNOSIS — D689 Coagulation defect, unspecified: Secondary | ICD-10-CM | POA: Diagnosis not present

## 2023-09-27 DIAGNOSIS — E875 Hyperkalemia: Secondary | ICD-10-CM | POA: Diagnosis not present

## 2023-09-27 DIAGNOSIS — D631 Anemia in chronic kidney disease: Secondary | ICD-10-CM | POA: Diagnosis not present

## 2023-09-27 DIAGNOSIS — N2581 Secondary hyperparathyroidism of renal origin: Secondary | ICD-10-CM | POA: Diagnosis not present

## 2023-09-27 DIAGNOSIS — Z992 Dependence on renal dialysis: Secondary | ICD-10-CM | POA: Diagnosis not present

## 2023-09-27 DIAGNOSIS — D509 Iron deficiency anemia, unspecified: Secondary | ICD-10-CM | POA: Diagnosis not present

## 2023-09-27 DIAGNOSIS — N186 End stage renal disease: Secondary | ICD-10-CM | POA: Diagnosis not present

## 2023-09-27 DIAGNOSIS — E1122 Type 2 diabetes mellitus with diabetic chronic kidney disease: Secondary | ICD-10-CM | POA: Diagnosis not present

## 2023-09-27 DIAGNOSIS — N032 Chronic nephritic syndrome with diffuse membranous glomerulonephritis: Secondary | ICD-10-CM | POA: Diagnosis not present

## 2023-09-30 DIAGNOSIS — D689 Coagulation defect, unspecified: Secondary | ICD-10-CM | POA: Diagnosis not present

## 2023-09-30 DIAGNOSIS — E875 Hyperkalemia: Secondary | ICD-10-CM | POA: Diagnosis not present

## 2023-09-30 DIAGNOSIS — D631 Anemia in chronic kidney disease: Secondary | ICD-10-CM | POA: Diagnosis not present

## 2023-09-30 DIAGNOSIS — Z992 Dependence on renal dialysis: Secondary | ICD-10-CM | POA: Diagnosis not present

## 2023-09-30 DIAGNOSIS — N186 End stage renal disease: Secondary | ICD-10-CM | POA: Diagnosis not present

## 2023-09-30 DIAGNOSIS — N2581 Secondary hyperparathyroidism of renal origin: Secondary | ICD-10-CM | POA: Diagnosis not present

## 2023-10-02 DIAGNOSIS — D689 Coagulation defect, unspecified: Secondary | ICD-10-CM | POA: Diagnosis not present

## 2023-10-02 DIAGNOSIS — N2581 Secondary hyperparathyroidism of renal origin: Secondary | ICD-10-CM | POA: Diagnosis not present

## 2023-10-02 DIAGNOSIS — D631 Anemia in chronic kidney disease: Secondary | ICD-10-CM | POA: Diagnosis not present

## 2023-10-02 DIAGNOSIS — N186 End stage renal disease: Secondary | ICD-10-CM | POA: Diagnosis not present

## 2023-10-02 DIAGNOSIS — E875 Hyperkalemia: Secondary | ICD-10-CM | POA: Diagnosis not present

## 2023-10-02 DIAGNOSIS — Z992 Dependence on renal dialysis: Secondary | ICD-10-CM | POA: Diagnosis not present

## 2023-10-04 DIAGNOSIS — E875 Hyperkalemia: Secondary | ICD-10-CM | POA: Diagnosis not present

## 2023-10-04 DIAGNOSIS — D631 Anemia in chronic kidney disease: Secondary | ICD-10-CM | POA: Diagnosis not present

## 2023-10-04 DIAGNOSIS — N186 End stage renal disease: Secondary | ICD-10-CM | POA: Diagnosis not present

## 2023-10-04 DIAGNOSIS — N2581 Secondary hyperparathyroidism of renal origin: Secondary | ICD-10-CM | POA: Diagnosis not present

## 2023-10-04 DIAGNOSIS — D689 Coagulation defect, unspecified: Secondary | ICD-10-CM | POA: Diagnosis not present

## 2023-10-04 DIAGNOSIS — Z992 Dependence on renal dialysis: Secondary | ICD-10-CM | POA: Diagnosis not present

## 2023-10-07 DIAGNOSIS — N2581 Secondary hyperparathyroidism of renal origin: Secondary | ICD-10-CM | POA: Diagnosis not present

## 2023-10-07 DIAGNOSIS — E875 Hyperkalemia: Secondary | ICD-10-CM | POA: Diagnosis not present

## 2023-10-07 DIAGNOSIS — N186 End stage renal disease: Secondary | ICD-10-CM | POA: Diagnosis not present

## 2023-10-07 DIAGNOSIS — D689 Coagulation defect, unspecified: Secondary | ICD-10-CM | POA: Diagnosis not present

## 2023-10-07 DIAGNOSIS — Z992 Dependence on renal dialysis: Secondary | ICD-10-CM | POA: Diagnosis not present

## 2023-10-07 DIAGNOSIS — D631 Anemia in chronic kidney disease: Secondary | ICD-10-CM | POA: Diagnosis not present

## 2023-10-09 ENCOUNTER — Ambulatory Visit (INDEPENDENT_AMBULATORY_CARE_PROVIDER_SITE_OTHER): Payer: 59

## 2023-10-09 DIAGNOSIS — I255 Ischemic cardiomyopathy: Secondary | ICD-10-CM

## 2023-10-09 DIAGNOSIS — E875 Hyperkalemia: Secondary | ICD-10-CM | POA: Diagnosis not present

## 2023-10-09 DIAGNOSIS — N2581 Secondary hyperparathyroidism of renal origin: Secondary | ICD-10-CM | POA: Diagnosis not present

## 2023-10-09 DIAGNOSIS — D689 Coagulation defect, unspecified: Secondary | ICD-10-CM | POA: Diagnosis not present

## 2023-10-09 DIAGNOSIS — D631 Anemia in chronic kidney disease: Secondary | ICD-10-CM | POA: Diagnosis not present

## 2023-10-09 DIAGNOSIS — N186 End stage renal disease: Secondary | ICD-10-CM | POA: Diagnosis not present

## 2023-10-09 DIAGNOSIS — Z992 Dependence on renal dialysis: Secondary | ICD-10-CM | POA: Diagnosis not present

## 2023-10-09 LAB — CUP PACEART REMOTE DEVICE CHECK
Battery Remaining Longevity: 52 mo
Battery Voltage: 2.96 V
Brady Statistic RV Percent Paced: 0 %
Date Time Interrogation Session: 20241212012403
HighPow Impedance: 254 Ohm
HighPow Impedance: 72 Ohm
Implantable Lead Connection Status: 753985
Implantable Lead Implant Date: 20210315
Implantable Lead Location: 753860
Implantable Lead Model: 6935
Implantable Pulse Generator Implant Date: 20190208
Lead Channel Impedance Value: 342 Ohm
Lead Channel Impedance Value: 456 Ohm
Lead Channel Pacing Threshold Amplitude: 0.5 V
Lead Channel Pacing Threshold Pulse Width: 0.4 ms
Lead Channel Sensing Intrinsic Amplitude: 13.25 mV
Lead Channel Sensing Intrinsic Amplitude: 13.25 mV
Lead Channel Setting Pacing Amplitude: 2 V
Lead Channel Setting Pacing Pulse Width: 0.4 ms
Lead Channel Setting Sensing Sensitivity: 0.3 mV
Zone Setting Status: 755011

## 2023-10-11 DIAGNOSIS — E875 Hyperkalemia: Secondary | ICD-10-CM | POA: Diagnosis not present

## 2023-10-11 DIAGNOSIS — Z992 Dependence on renal dialysis: Secondary | ICD-10-CM | POA: Diagnosis not present

## 2023-10-11 DIAGNOSIS — N2581 Secondary hyperparathyroidism of renal origin: Secondary | ICD-10-CM | POA: Diagnosis not present

## 2023-10-11 DIAGNOSIS — D689 Coagulation defect, unspecified: Secondary | ICD-10-CM | POA: Diagnosis not present

## 2023-10-11 DIAGNOSIS — N186 End stage renal disease: Secondary | ICD-10-CM | POA: Diagnosis not present

## 2023-10-11 DIAGNOSIS — D631 Anemia in chronic kidney disease: Secondary | ICD-10-CM | POA: Diagnosis not present

## 2023-10-14 DIAGNOSIS — D689 Coagulation defect, unspecified: Secondary | ICD-10-CM | POA: Diagnosis not present

## 2023-10-14 DIAGNOSIS — E875 Hyperkalemia: Secondary | ICD-10-CM | POA: Diagnosis not present

## 2023-10-14 DIAGNOSIS — Z992 Dependence on renal dialysis: Secondary | ICD-10-CM | POA: Diagnosis not present

## 2023-10-14 DIAGNOSIS — N186 End stage renal disease: Secondary | ICD-10-CM | POA: Diagnosis not present

## 2023-10-14 DIAGNOSIS — D631 Anemia in chronic kidney disease: Secondary | ICD-10-CM | POA: Diagnosis not present

## 2023-10-14 DIAGNOSIS — N2581 Secondary hyperparathyroidism of renal origin: Secondary | ICD-10-CM | POA: Diagnosis not present

## 2023-10-16 DIAGNOSIS — E875 Hyperkalemia: Secondary | ICD-10-CM | POA: Diagnosis not present

## 2023-10-16 DIAGNOSIS — D631 Anemia in chronic kidney disease: Secondary | ICD-10-CM | POA: Diagnosis not present

## 2023-10-16 DIAGNOSIS — N2581 Secondary hyperparathyroidism of renal origin: Secondary | ICD-10-CM | POA: Diagnosis not present

## 2023-10-16 DIAGNOSIS — Z992 Dependence on renal dialysis: Secondary | ICD-10-CM | POA: Diagnosis not present

## 2023-10-16 DIAGNOSIS — N186 End stage renal disease: Secondary | ICD-10-CM | POA: Diagnosis not present

## 2023-10-16 DIAGNOSIS — D689 Coagulation defect, unspecified: Secondary | ICD-10-CM | POA: Diagnosis not present

## 2023-10-18 DIAGNOSIS — N2581 Secondary hyperparathyroidism of renal origin: Secondary | ICD-10-CM | POA: Diagnosis not present

## 2023-10-18 DIAGNOSIS — N186 End stage renal disease: Secondary | ICD-10-CM | POA: Diagnosis not present

## 2023-10-18 DIAGNOSIS — D631 Anemia in chronic kidney disease: Secondary | ICD-10-CM | POA: Diagnosis not present

## 2023-10-18 DIAGNOSIS — D689 Coagulation defect, unspecified: Secondary | ICD-10-CM | POA: Diagnosis not present

## 2023-10-18 DIAGNOSIS — E875 Hyperkalemia: Secondary | ICD-10-CM | POA: Diagnosis not present

## 2023-10-18 DIAGNOSIS — Z992 Dependence on renal dialysis: Secondary | ICD-10-CM | POA: Diagnosis not present

## 2023-10-20 DIAGNOSIS — D631 Anemia in chronic kidney disease: Secondary | ICD-10-CM | POA: Diagnosis not present

## 2023-10-20 DIAGNOSIS — Z992 Dependence on renal dialysis: Secondary | ICD-10-CM | POA: Diagnosis not present

## 2023-10-20 DIAGNOSIS — E875 Hyperkalemia: Secondary | ICD-10-CM | POA: Diagnosis not present

## 2023-10-20 DIAGNOSIS — D689 Coagulation defect, unspecified: Secondary | ICD-10-CM | POA: Diagnosis not present

## 2023-10-20 DIAGNOSIS — N186 End stage renal disease: Secondary | ICD-10-CM | POA: Diagnosis not present

## 2023-10-20 DIAGNOSIS — N2581 Secondary hyperparathyroidism of renal origin: Secondary | ICD-10-CM | POA: Diagnosis not present

## 2023-10-23 DIAGNOSIS — N186 End stage renal disease: Secondary | ICD-10-CM | POA: Diagnosis not present

## 2023-10-23 DIAGNOSIS — D689 Coagulation defect, unspecified: Secondary | ICD-10-CM | POA: Diagnosis not present

## 2023-10-23 DIAGNOSIS — E875 Hyperkalemia: Secondary | ICD-10-CM | POA: Diagnosis not present

## 2023-10-23 DIAGNOSIS — N2581 Secondary hyperparathyroidism of renal origin: Secondary | ICD-10-CM | POA: Diagnosis not present

## 2023-10-23 DIAGNOSIS — Z992 Dependence on renal dialysis: Secondary | ICD-10-CM | POA: Diagnosis not present

## 2023-10-23 DIAGNOSIS — D631 Anemia in chronic kidney disease: Secondary | ICD-10-CM | POA: Diagnosis not present

## 2023-10-25 DIAGNOSIS — D631 Anemia in chronic kidney disease: Secondary | ICD-10-CM | POA: Diagnosis not present

## 2023-10-25 DIAGNOSIS — D689 Coagulation defect, unspecified: Secondary | ICD-10-CM | POA: Diagnosis not present

## 2023-10-25 DIAGNOSIS — E875 Hyperkalemia: Secondary | ICD-10-CM | POA: Diagnosis not present

## 2023-10-25 DIAGNOSIS — N2581 Secondary hyperparathyroidism of renal origin: Secondary | ICD-10-CM | POA: Diagnosis not present

## 2023-10-25 DIAGNOSIS — N186 End stage renal disease: Secondary | ICD-10-CM | POA: Diagnosis not present

## 2023-10-25 DIAGNOSIS — Z992 Dependence on renal dialysis: Secondary | ICD-10-CM | POA: Diagnosis not present

## 2023-10-27 DIAGNOSIS — Z992 Dependence on renal dialysis: Secondary | ICD-10-CM | POA: Diagnosis not present

## 2023-10-27 DIAGNOSIS — D631 Anemia in chronic kidney disease: Secondary | ICD-10-CM | POA: Diagnosis not present

## 2023-10-27 DIAGNOSIS — N2581 Secondary hyperparathyroidism of renal origin: Secondary | ICD-10-CM | POA: Diagnosis not present

## 2023-10-27 DIAGNOSIS — N186 End stage renal disease: Secondary | ICD-10-CM | POA: Diagnosis not present

## 2023-10-27 DIAGNOSIS — E875 Hyperkalemia: Secondary | ICD-10-CM | POA: Diagnosis not present

## 2023-10-27 DIAGNOSIS — D689 Coagulation defect, unspecified: Secondary | ICD-10-CM | POA: Diagnosis not present

## 2023-10-28 DIAGNOSIS — N186 End stage renal disease: Secondary | ICD-10-CM | POA: Diagnosis not present

## 2023-10-28 DIAGNOSIS — N032 Chronic nephritic syndrome with diffuse membranous glomerulonephritis: Secondary | ICD-10-CM | POA: Diagnosis not present

## 2023-10-28 DIAGNOSIS — Z992 Dependence on renal dialysis: Secondary | ICD-10-CM | POA: Diagnosis not present

## 2023-10-30 DIAGNOSIS — E1122 Type 2 diabetes mellitus with diabetic chronic kidney disease: Secondary | ICD-10-CM | POA: Diagnosis not present

## 2023-10-30 DIAGNOSIS — N186 End stage renal disease: Secondary | ICD-10-CM | POA: Diagnosis not present

## 2023-10-30 DIAGNOSIS — D689 Coagulation defect, unspecified: Secondary | ICD-10-CM | POA: Diagnosis not present

## 2023-10-30 DIAGNOSIS — E875 Hyperkalemia: Secondary | ICD-10-CM | POA: Diagnosis not present

## 2023-10-30 DIAGNOSIS — D631 Anemia in chronic kidney disease: Secondary | ICD-10-CM | POA: Diagnosis not present

## 2023-10-30 DIAGNOSIS — Z992 Dependence on renal dialysis: Secondary | ICD-10-CM | POA: Diagnosis not present

## 2023-10-30 DIAGNOSIS — N2581 Secondary hyperparathyroidism of renal origin: Secondary | ICD-10-CM | POA: Diagnosis not present

## 2023-10-30 DIAGNOSIS — D509 Iron deficiency anemia, unspecified: Secondary | ICD-10-CM | POA: Diagnosis not present

## 2023-11-01 DIAGNOSIS — E875 Hyperkalemia: Secondary | ICD-10-CM | POA: Diagnosis not present

## 2023-11-01 DIAGNOSIS — D631 Anemia in chronic kidney disease: Secondary | ICD-10-CM | POA: Diagnosis not present

## 2023-11-01 DIAGNOSIS — E1122 Type 2 diabetes mellitus with diabetic chronic kidney disease: Secondary | ICD-10-CM | POA: Diagnosis not present

## 2023-11-01 DIAGNOSIS — N186 End stage renal disease: Secondary | ICD-10-CM | POA: Diagnosis not present

## 2023-11-01 DIAGNOSIS — Z992 Dependence on renal dialysis: Secondary | ICD-10-CM | POA: Diagnosis not present

## 2023-11-01 DIAGNOSIS — D689 Coagulation defect, unspecified: Secondary | ICD-10-CM | POA: Diagnosis not present

## 2023-11-01 DIAGNOSIS — D509 Iron deficiency anemia, unspecified: Secondary | ICD-10-CM | POA: Diagnosis not present

## 2023-11-01 DIAGNOSIS — N2581 Secondary hyperparathyroidism of renal origin: Secondary | ICD-10-CM | POA: Diagnosis not present

## 2023-11-04 DIAGNOSIS — E1122 Type 2 diabetes mellitus with diabetic chronic kidney disease: Secondary | ICD-10-CM | POA: Diagnosis not present

## 2023-11-04 DIAGNOSIS — D689 Coagulation defect, unspecified: Secondary | ICD-10-CM | POA: Diagnosis not present

## 2023-11-04 DIAGNOSIS — D509 Iron deficiency anemia, unspecified: Secondary | ICD-10-CM | POA: Diagnosis not present

## 2023-11-04 DIAGNOSIS — N186 End stage renal disease: Secondary | ICD-10-CM | POA: Diagnosis not present

## 2023-11-04 DIAGNOSIS — D631 Anemia in chronic kidney disease: Secondary | ICD-10-CM | POA: Diagnosis not present

## 2023-11-04 DIAGNOSIS — Z992 Dependence on renal dialysis: Secondary | ICD-10-CM | POA: Diagnosis not present

## 2023-11-04 DIAGNOSIS — N2581 Secondary hyperparathyroidism of renal origin: Secondary | ICD-10-CM | POA: Diagnosis not present

## 2023-11-04 DIAGNOSIS — E875 Hyperkalemia: Secondary | ICD-10-CM | POA: Diagnosis not present

## 2023-11-06 DIAGNOSIS — D631 Anemia in chronic kidney disease: Secondary | ICD-10-CM | POA: Diagnosis not present

## 2023-11-06 DIAGNOSIS — N2581 Secondary hyperparathyroidism of renal origin: Secondary | ICD-10-CM | POA: Diagnosis not present

## 2023-11-06 DIAGNOSIS — D689 Coagulation defect, unspecified: Secondary | ICD-10-CM | POA: Diagnosis not present

## 2023-11-06 DIAGNOSIS — E1122 Type 2 diabetes mellitus with diabetic chronic kidney disease: Secondary | ICD-10-CM | POA: Diagnosis not present

## 2023-11-06 DIAGNOSIS — N186 End stage renal disease: Secondary | ICD-10-CM | POA: Diagnosis not present

## 2023-11-06 DIAGNOSIS — E875 Hyperkalemia: Secondary | ICD-10-CM | POA: Diagnosis not present

## 2023-11-06 DIAGNOSIS — D509 Iron deficiency anemia, unspecified: Secondary | ICD-10-CM | POA: Diagnosis not present

## 2023-11-06 DIAGNOSIS — Z992 Dependence on renal dialysis: Secondary | ICD-10-CM | POA: Diagnosis not present

## 2023-11-08 DIAGNOSIS — D689 Coagulation defect, unspecified: Secondary | ICD-10-CM | POA: Diagnosis not present

## 2023-11-08 DIAGNOSIS — N2581 Secondary hyperparathyroidism of renal origin: Secondary | ICD-10-CM | POA: Diagnosis not present

## 2023-11-08 DIAGNOSIS — E1122 Type 2 diabetes mellitus with diabetic chronic kidney disease: Secondary | ICD-10-CM | POA: Diagnosis not present

## 2023-11-08 DIAGNOSIS — N186 End stage renal disease: Secondary | ICD-10-CM | POA: Diagnosis not present

## 2023-11-08 DIAGNOSIS — E875 Hyperkalemia: Secondary | ICD-10-CM | POA: Diagnosis not present

## 2023-11-08 DIAGNOSIS — D631 Anemia in chronic kidney disease: Secondary | ICD-10-CM | POA: Diagnosis not present

## 2023-11-08 DIAGNOSIS — Z992 Dependence on renal dialysis: Secondary | ICD-10-CM | POA: Diagnosis not present

## 2023-11-08 DIAGNOSIS — D509 Iron deficiency anemia, unspecified: Secondary | ICD-10-CM | POA: Diagnosis not present

## 2023-11-11 DIAGNOSIS — D631 Anemia in chronic kidney disease: Secondary | ICD-10-CM | POA: Diagnosis not present

## 2023-11-11 DIAGNOSIS — E875 Hyperkalemia: Secondary | ICD-10-CM | POA: Diagnosis not present

## 2023-11-11 DIAGNOSIS — E1122 Type 2 diabetes mellitus with diabetic chronic kidney disease: Secondary | ICD-10-CM | POA: Diagnosis not present

## 2023-11-11 DIAGNOSIS — D509 Iron deficiency anemia, unspecified: Secondary | ICD-10-CM | POA: Diagnosis not present

## 2023-11-11 DIAGNOSIS — D689 Coagulation defect, unspecified: Secondary | ICD-10-CM | POA: Diagnosis not present

## 2023-11-11 DIAGNOSIS — N2581 Secondary hyperparathyroidism of renal origin: Secondary | ICD-10-CM | POA: Diagnosis not present

## 2023-11-11 DIAGNOSIS — Z992 Dependence on renal dialysis: Secondary | ICD-10-CM | POA: Diagnosis not present

## 2023-11-11 DIAGNOSIS — N186 End stage renal disease: Secondary | ICD-10-CM | POA: Diagnosis not present

## 2023-11-13 DIAGNOSIS — E1122 Type 2 diabetes mellitus with diabetic chronic kidney disease: Secondary | ICD-10-CM | POA: Diagnosis not present

## 2023-11-13 DIAGNOSIS — Z992 Dependence on renal dialysis: Secondary | ICD-10-CM | POA: Diagnosis not present

## 2023-11-13 DIAGNOSIS — D509 Iron deficiency anemia, unspecified: Secondary | ICD-10-CM | POA: Diagnosis not present

## 2023-11-13 DIAGNOSIS — D631 Anemia in chronic kidney disease: Secondary | ICD-10-CM | POA: Diagnosis not present

## 2023-11-13 DIAGNOSIS — N2581 Secondary hyperparathyroidism of renal origin: Secondary | ICD-10-CM | POA: Diagnosis not present

## 2023-11-13 DIAGNOSIS — N186 End stage renal disease: Secondary | ICD-10-CM | POA: Diagnosis not present

## 2023-11-13 DIAGNOSIS — E875 Hyperkalemia: Secondary | ICD-10-CM | POA: Diagnosis not present

## 2023-11-13 DIAGNOSIS — D689 Coagulation defect, unspecified: Secondary | ICD-10-CM | POA: Diagnosis not present

## 2023-11-14 DIAGNOSIS — H0589 Other disorders of orbit: Secondary | ICD-10-CM | POA: Diagnosis not present

## 2023-11-14 DIAGNOSIS — I7782 Antineutrophilic cytoplasmic antibody (ANCA) vasculitis: Secondary | ICD-10-CM | POA: Diagnosis not present

## 2023-11-14 DIAGNOSIS — M3131 Wegener's granulomatosis with renal involvement: Secondary | ICD-10-CM | POA: Diagnosis not present

## 2023-11-14 DIAGNOSIS — Z79620 Long term (current) use of immunosuppressive biologic: Secondary | ICD-10-CM | POA: Diagnosis not present

## 2023-11-14 NOTE — Progress Notes (Signed)
Remote ICD transmission.   

## 2023-11-15 DIAGNOSIS — E875 Hyperkalemia: Secondary | ICD-10-CM | POA: Diagnosis not present

## 2023-11-15 DIAGNOSIS — N2581 Secondary hyperparathyroidism of renal origin: Secondary | ICD-10-CM | POA: Diagnosis not present

## 2023-11-15 DIAGNOSIS — N186 End stage renal disease: Secondary | ICD-10-CM | POA: Diagnosis not present

## 2023-11-15 DIAGNOSIS — D689 Coagulation defect, unspecified: Secondary | ICD-10-CM | POA: Diagnosis not present

## 2023-11-15 DIAGNOSIS — Z992 Dependence on renal dialysis: Secondary | ICD-10-CM | POA: Diagnosis not present

## 2023-11-15 DIAGNOSIS — D631 Anemia in chronic kidney disease: Secondary | ICD-10-CM | POA: Diagnosis not present

## 2023-11-15 DIAGNOSIS — D509 Iron deficiency anemia, unspecified: Secondary | ICD-10-CM | POA: Diagnosis not present

## 2023-11-15 DIAGNOSIS — E1122 Type 2 diabetes mellitus with diabetic chronic kidney disease: Secondary | ICD-10-CM | POA: Diagnosis not present

## 2023-11-18 DIAGNOSIS — D689 Coagulation defect, unspecified: Secondary | ICD-10-CM | POA: Diagnosis not present

## 2023-11-18 DIAGNOSIS — D509 Iron deficiency anemia, unspecified: Secondary | ICD-10-CM | POA: Diagnosis not present

## 2023-11-18 DIAGNOSIS — E875 Hyperkalemia: Secondary | ICD-10-CM | POA: Diagnosis not present

## 2023-11-18 DIAGNOSIS — D631 Anemia in chronic kidney disease: Secondary | ICD-10-CM | POA: Diagnosis not present

## 2023-11-18 DIAGNOSIS — Z992 Dependence on renal dialysis: Secondary | ICD-10-CM | POA: Diagnosis not present

## 2023-11-18 DIAGNOSIS — N2581 Secondary hyperparathyroidism of renal origin: Secondary | ICD-10-CM | POA: Diagnosis not present

## 2023-11-18 DIAGNOSIS — E1122 Type 2 diabetes mellitus with diabetic chronic kidney disease: Secondary | ICD-10-CM | POA: Diagnosis not present

## 2023-11-18 DIAGNOSIS — N186 End stage renal disease: Secondary | ICD-10-CM | POA: Diagnosis not present

## 2023-11-20 DIAGNOSIS — N2581 Secondary hyperparathyroidism of renal origin: Secondary | ICD-10-CM | POA: Diagnosis not present

## 2023-11-20 DIAGNOSIS — D509 Iron deficiency anemia, unspecified: Secondary | ICD-10-CM | POA: Diagnosis not present

## 2023-11-20 DIAGNOSIS — D689 Coagulation defect, unspecified: Secondary | ICD-10-CM | POA: Diagnosis not present

## 2023-11-20 DIAGNOSIS — Z992 Dependence on renal dialysis: Secondary | ICD-10-CM | POA: Diagnosis not present

## 2023-11-20 DIAGNOSIS — N186 End stage renal disease: Secondary | ICD-10-CM | POA: Diagnosis not present

## 2023-11-20 DIAGNOSIS — E875 Hyperkalemia: Secondary | ICD-10-CM | POA: Diagnosis not present

## 2023-11-20 DIAGNOSIS — E1122 Type 2 diabetes mellitus with diabetic chronic kidney disease: Secondary | ICD-10-CM | POA: Diagnosis not present

## 2023-11-20 DIAGNOSIS — D631 Anemia in chronic kidney disease: Secondary | ICD-10-CM | POA: Diagnosis not present

## 2023-11-22 DIAGNOSIS — D631 Anemia in chronic kidney disease: Secondary | ICD-10-CM | POA: Diagnosis not present

## 2023-11-22 DIAGNOSIS — D689 Coagulation defect, unspecified: Secondary | ICD-10-CM | POA: Diagnosis not present

## 2023-11-22 DIAGNOSIS — Z992 Dependence on renal dialysis: Secondary | ICD-10-CM | POA: Diagnosis not present

## 2023-11-22 DIAGNOSIS — E875 Hyperkalemia: Secondary | ICD-10-CM | POA: Diagnosis not present

## 2023-11-22 DIAGNOSIS — E1122 Type 2 diabetes mellitus with diabetic chronic kidney disease: Secondary | ICD-10-CM | POA: Diagnosis not present

## 2023-11-22 DIAGNOSIS — D509 Iron deficiency anemia, unspecified: Secondary | ICD-10-CM | POA: Diagnosis not present

## 2023-11-22 DIAGNOSIS — N2581 Secondary hyperparathyroidism of renal origin: Secondary | ICD-10-CM | POA: Diagnosis not present

## 2023-11-22 DIAGNOSIS — N186 End stage renal disease: Secondary | ICD-10-CM | POA: Diagnosis not present

## 2023-11-24 DIAGNOSIS — N186 End stage renal disease: Secondary | ICD-10-CM | POA: Diagnosis not present

## 2023-11-25 DIAGNOSIS — N2581 Secondary hyperparathyroidism of renal origin: Secondary | ICD-10-CM | POA: Diagnosis not present

## 2023-11-25 DIAGNOSIS — Z992 Dependence on renal dialysis: Secondary | ICD-10-CM | POA: Diagnosis not present

## 2023-11-25 DIAGNOSIS — N186 End stage renal disease: Secondary | ICD-10-CM | POA: Diagnosis not present

## 2023-11-25 DIAGNOSIS — D509 Iron deficiency anemia, unspecified: Secondary | ICD-10-CM | POA: Diagnosis not present

## 2023-11-25 DIAGNOSIS — D689 Coagulation defect, unspecified: Secondary | ICD-10-CM | POA: Diagnosis not present

## 2023-11-25 DIAGNOSIS — D631 Anemia in chronic kidney disease: Secondary | ICD-10-CM | POA: Diagnosis not present

## 2023-11-25 DIAGNOSIS — E1122 Type 2 diabetes mellitus with diabetic chronic kidney disease: Secondary | ICD-10-CM | POA: Diagnosis not present

## 2023-11-25 DIAGNOSIS — E875 Hyperkalemia: Secondary | ICD-10-CM | POA: Diagnosis not present

## 2023-11-27 DIAGNOSIS — D689 Coagulation defect, unspecified: Secondary | ICD-10-CM | POA: Diagnosis not present

## 2023-11-27 DIAGNOSIS — D509 Iron deficiency anemia, unspecified: Secondary | ICD-10-CM | POA: Diagnosis not present

## 2023-11-27 DIAGNOSIS — N186 End stage renal disease: Secondary | ICD-10-CM | POA: Diagnosis not present

## 2023-11-27 DIAGNOSIS — Z992 Dependence on renal dialysis: Secondary | ICD-10-CM | POA: Diagnosis not present

## 2023-11-27 DIAGNOSIS — E1122 Type 2 diabetes mellitus with diabetic chronic kidney disease: Secondary | ICD-10-CM | POA: Diagnosis not present

## 2023-11-27 DIAGNOSIS — E875 Hyperkalemia: Secondary | ICD-10-CM | POA: Diagnosis not present

## 2023-11-27 DIAGNOSIS — D631 Anemia in chronic kidney disease: Secondary | ICD-10-CM | POA: Diagnosis not present

## 2023-11-27 DIAGNOSIS — N2581 Secondary hyperparathyroidism of renal origin: Secondary | ICD-10-CM | POA: Diagnosis not present

## 2023-11-28 DIAGNOSIS — N186 End stage renal disease: Secondary | ICD-10-CM | POA: Diagnosis not present

## 2023-11-28 DIAGNOSIS — Z992 Dependence on renal dialysis: Secondary | ICD-10-CM | POA: Diagnosis not present

## 2023-11-28 DIAGNOSIS — N032 Chronic nephritic syndrome with diffuse membranous glomerulonephritis: Secondary | ICD-10-CM | POA: Diagnosis not present

## 2023-11-29 DIAGNOSIS — D631 Anemia in chronic kidney disease: Secondary | ICD-10-CM | POA: Diagnosis not present

## 2023-11-29 DIAGNOSIS — N186 End stage renal disease: Secondary | ICD-10-CM | POA: Diagnosis not present

## 2023-11-29 DIAGNOSIS — N2581 Secondary hyperparathyroidism of renal origin: Secondary | ICD-10-CM | POA: Diagnosis not present

## 2023-11-29 DIAGNOSIS — D689 Coagulation defect, unspecified: Secondary | ICD-10-CM | POA: Diagnosis not present

## 2023-11-29 DIAGNOSIS — E875 Hyperkalemia: Secondary | ICD-10-CM | POA: Diagnosis not present

## 2023-11-29 DIAGNOSIS — Z992 Dependence on renal dialysis: Secondary | ICD-10-CM | POA: Diagnosis not present

## 2023-11-29 DIAGNOSIS — E1122 Type 2 diabetes mellitus with diabetic chronic kidney disease: Secondary | ICD-10-CM | POA: Diagnosis not present

## 2023-12-02 DIAGNOSIS — E1122 Type 2 diabetes mellitus with diabetic chronic kidney disease: Secondary | ICD-10-CM | POA: Diagnosis not present

## 2023-12-02 DIAGNOSIS — N186 End stage renal disease: Secondary | ICD-10-CM | POA: Diagnosis not present

## 2023-12-02 DIAGNOSIS — N2581 Secondary hyperparathyroidism of renal origin: Secondary | ICD-10-CM | POA: Diagnosis not present

## 2023-12-02 DIAGNOSIS — D631 Anemia in chronic kidney disease: Secondary | ICD-10-CM | POA: Diagnosis not present

## 2023-12-02 DIAGNOSIS — E875 Hyperkalemia: Secondary | ICD-10-CM | POA: Diagnosis not present

## 2023-12-02 DIAGNOSIS — D689 Coagulation defect, unspecified: Secondary | ICD-10-CM | POA: Diagnosis not present

## 2023-12-02 DIAGNOSIS — Z992 Dependence on renal dialysis: Secondary | ICD-10-CM | POA: Diagnosis not present

## 2023-12-04 DIAGNOSIS — E1122 Type 2 diabetes mellitus with diabetic chronic kidney disease: Secondary | ICD-10-CM | POA: Diagnosis not present

## 2023-12-04 DIAGNOSIS — D689 Coagulation defect, unspecified: Secondary | ICD-10-CM | POA: Diagnosis not present

## 2023-12-04 DIAGNOSIS — N2581 Secondary hyperparathyroidism of renal origin: Secondary | ICD-10-CM | POA: Diagnosis not present

## 2023-12-04 DIAGNOSIS — D631 Anemia in chronic kidney disease: Secondary | ICD-10-CM | POA: Diagnosis not present

## 2023-12-04 DIAGNOSIS — Z992 Dependence on renal dialysis: Secondary | ICD-10-CM | POA: Diagnosis not present

## 2023-12-04 DIAGNOSIS — E875 Hyperkalemia: Secondary | ICD-10-CM | POA: Diagnosis not present

## 2023-12-04 DIAGNOSIS — N186 End stage renal disease: Secondary | ICD-10-CM | POA: Diagnosis not present

## 2023-12-06 DIAGNOSIS — E1122 Type 2 diabetes mellitus with diabetic chronic kidney disease: Secondary | ICD-10-CM | POA: Diagnosis not present

## 2023-12-06 DIAGNOSIS — N186 End stage renal disease: Secondary | ICD-10-CM | POA: Diagnosis not present

## 2023-12-06 DIAGNOSIS — Z992 Dependence on renal dialysis: Secondary | ICD-10-CM | POA: Diagnosis not present

## 2023-12-06 DIAGNOSIS — D689 Coagulation defect, unspecified: Secondary | ICD-10-CM | POA: Diagnosis not present

## 2023-12-06 DIAGNOSIS — N2581 Secondary hyperparathyroidism of renal origin: Secondary | ICD-10-CM | POA: Diagnosis not present

## 2023-12-06 DIAGNOSIS — D631 Anemia in chronic kidney disease: Secondary | ICD-10-CM | POA: Diagnosis not present

## 2023-12-06 DIAGNOSIS — E875 Hyperkalemia: Secondary | ICD-10-CM | POA: Diagnosis not present

## 2023-12-09 DIAGNOSIS — D631 Anemia in chronic kidney disease: Secondary | ICD-10-CM | POA: Diagnosis not present

## 2023-12-09 DIAGNOSIS — N186 End stage renal disease: Secondary | ICD-10-CM | POA: Diagnosis not present

## 2023-12-09 DIAGNOSIS — Z992 Dependence on renal dialysis: Secondary | ICD-10-CM | POA: Diagnosis not present

## 2023-12-09 DIAGNOSIS — E1122 Type 2 diabetes mellitus with diabetic chronic kidney disease: Secondary | ICD-10-CM | POA: Diagnosis not present

## 2023-12-09 DIAGNOSIS — E875 Hyperkalemia: Secondary | ICD-10-CM | POA: Diagnosis not present

## 2023-12-09 DIAGNOSIS — N2581 Secondary hyperparathyroidism of renal origin: Secondary | ICD-10-CM | POA: Diagnosis not present

## 2023-12-09 DIAGNOSIS — D689 Coagulation defect, unspecified: Secondary | ICD-10-CM | POA: Diagnosis not present

## 2023-12-11 DIAGNOSIS — E1122 Type 2 diabetes mellitus with diabetic chronic kidney disease: Secondary | ICD-10-CM | POA: Diagnosis not present

## 2023-12-11 DIAGNOSIS — E875 Hyperkalemia: Secondary | ICD-10-CM | POA: Diagnosis not present

## 2023-12-11 DIAGNOSIS — D689 Coagulation defect, unspecified: Secondary | ICD-10-CM | POA: Diagnosis not present

## 2023-12-11 DIAGNOSIS — N186 End stage renal disease: Secondary | ICD-10-CM | POA: Diagnosis not present

## 2023-12-11 DIAGNOSIS — Z992 Dependence on renal dialysis: Secondary | ICD-10-CM | POA: Diagnosis not present

## 2023-12-11 DIAGNOSIS — D631 Anemia in chronic kidney disease: Secondary | ICD-10-CM | POA: Diagnosis not present

## 2023-12-11 DIAGNOSIS — N2581 Secondary hyperparathyroidism of renal origin: Secondary | ICD-10-CM | POA: Diagnosis not present

## 2023-12-13 DIAGNOSIS — N186 End stage renal disease: Secondary | ICD-10-CM | POA: Diagnosis not present

## 2023-12-13 DIAGNOSIS — D631 Anemia in chronic kidney disease: Secondary | ICD-10-CM | POA: Diagnosis not present

## 2023-12-13 DIAGNOSIS — E875 Hyperkalemia: Secondary | ICD-10-CM | POA: Diagnosis not present

## 2023-12-13 DIAGNOSIS — Z992 Dependence on renal dialysis: Secondary | ICD-10-CM | POA: Diagnosis not present

## 2023-12-13 DIAGNOSIS — E1122 Type 2 diabetes mellitus with diabetic chronic kidney disease: Secondary | ICD-10-CM | POA: Diagnosis not present

## 2023-12-13 DIAGNOSIS — N2581 Secondary hyperparathyroidism of renal origin: Secondary | ICD-10-CM | POA: Diagnosis not present

## 2023-12-13 DIAGNOSIS — D689 Coagulation defect, unspecified: Secondary | ICD-10-CM | POA: Diagnosis not present

## 2023-12-16 DIAGNOSIS — D689 Coagulation defect, unspecified: Secondary | ICD-10-CM | POA: Diagnosis not present

## 2023-12-16 DIAGNOSIS — D631 Anemia in chronic kidney disease: Secondary | ICD-10-CM | POA: Diagnosis not present

## 2023-12-16 DIAGNOSIS — N186 End stage renal disease: Secondary | ICD-10-CM | POA: Diagnosis not present

## 2023-12-16 DIAGNOSIS — Z992 Dependence on renal dialysis: Secondary | ICD-10-CM | POA: Diagnosis not present

## 2023-12-16 DIAGNOSIS — E875 Hyperkalemia: Secondary | ICD-10-CM | POA: Diagnosis not present

## 2023-12-16 DIAGNOSIS — E1122 Type 2 diabetes mellitus with diabetic chronic kidney disease: Secondary | ICD-10-CM | POA: Diagnosis not present

## 2023-12-16 DIAGNOSIS — N2581 Secondary hyperparathyroidism of renal origin: Secondary | ICD-10-CM | POA: Diagnosis not present

## 2023-12-18 DIAGNOSIS — N186 End stage renal disease: Secondary | ICD-10-CM | POA: Diagnosis not present

## 2023-12-18 DIAGNOSIS — D631 Anemia in chronic kidney disease: Secondary | ICD-10-CM | POA: Diagnosis not present

## 2023-12-18 DIAGNOSIS — Z992 Dependence on renal dialysis: Secondary | ICD-10-CM | POA: Diagnosis not present

## 2023-12-18 DIAGNOSIS — E875 Hyperkalemia: Secondary | ICD-10-CM | POA: Diagnosis not present

## 2023-12-18 DIAGNOSIS — D689 Coagulation defect, unspecified: Secondary | ICD-10-CM | POA: Diagnosis not present

## 2023-12-18 DIAGNOSIS — N2581 Secondary hyperparathyroidism of renal origin: Secondary | ICD-10-CM | POA: Diagnosis not present

## 2023-12-18 DIAGNOSIS — E1122 Type 2 diabetes mellitus with diabetic chronic kidney disease: Secondary | ICD-10-CM | POA: Diagnosis not present

## 2023-12-18 NOTE — Progress Notes (Unsigned)
Patient ID: James Nicholson, male   DOB: 06-Jan-1966, 58 y.o.   MRN: [id number]  Reason for Consult: No chief complaint on file.   Referred by Jackie Plum, MD  Subjective:     HPI  James Nicholson is a 58 y.o. male with ESRD and a right radiocephalic fistula that has been plicated x 2 for large aneurysms with Dr. Durwin Nora in July 2021 and October 2021.  He now presents due to thin skin/ulceration overlying AVF.  He denies any issues with dialysis.  He reports HD sessions of run well without any early stoppages or prolonged bleeding after decannulation.  Past Medical History:  Diagnosis Date   AICD (automatic cardioverter/defibrillator) present    Medtronic Visia AF MRIT VR [id number]   Anemia    Atrial fibrillation    Cardiomyopathy (HCC) 2010   Unclear Etiology: Last Echo 06/2009: EF 40-45%, severe Lateral & apical Hypokinesis (? CAD) ; Grade 2 DDysfxn. Mild conc LVH.    Cellulitis    Chronic kidney disease (CKD), stage V (HCC)    Dialysis Tue, Thurs, Sat   Confusion    Depression    no meds   Diabetes mellitus without complication (HCC)    type 2, diet controlled, no meds, pt. denies   Dyslipidemia    Enterobacter sepsis (HCC)    HLD (hyperlipidemia)    Hypertension    Pneumonia    S/P ICD (internal cardiac defibrillator) procedure 2010   VT Arrest (in [place])   Schizophrenia [facility])    no meds   Wegener's disease, pulmonary 02/05/2013   Wegener's granulomatosis    Family History  Problem Relation Age of Onset   CAD Mother    Past Surgical History:  Procedure Laterality Date   A/V FISTULAGRAM Right 03/17/2020   Procedure: A/V FISTULAGRAM;  Surgeon: Chuck Hint, MD;  Location: [facility] INVASIVE CV LAB;  Service: Cardiovascular;  Laterality: Right;   AV FISTULA PLACEMENT Right 02/10/2013   Procedure: ARTERIOVENOUS (AV) FISTULA CREATION;  Surgeon: Larina Earthly, MD;  Location: [facility] OR;  Service: Vascular;  Laterality: Right;  Right forearm radial/cephalic  arterovenous fistula.    CARDIAC CATHETERIZATION  2010   [place]: In setting of VT arrest. Per brother's report, nonobstructive with no intervention   CARDIAC DEFIBRILLATOR PLACEMENT  2010   [place]   EYE SURGERY Right    FISTULOGRAM Right 08/09/2013   Procedure: FISTULOGRAM;  Surgeon: Chuck Hint, MD;  Location: [facility] CATH LAB;  Service: Cardiovascular;  Laterality: Right;   ICD GENERATOR CHANGEOUT N/A 12/05/2017   Procedure: ICD GENERATOR CHANGEOUT;  Surgeon: Marinus Maw, MD;  Location: [facility] INVASIVE CV LAB;  Service: Cardiovascular;  Laterality: N/A;   ICD LEAD REMOVAL N/A 01/10/2020   Procedure: ICD LEAD REMOVAL AND IMPLANTATION OF NEW LEAD IN RIGHT VENTRICLE;  Surgeon: Marinus Maw, MD;  Location: [facility] OR;  Service: Cardiovascular;  Laterality: N/A;   INSERTION OF DIALYSIS CATHETER Right 08/07/2020   Procedure: INSERTION OF DIALYSIS CATHETER;  Surgeon: Chuck Hint, MD;  Location: [facility] OR;  Service: Vascular;  Laterality: Right;   LIGATION OF ARTERIOVENOUS  FISTULA Right 05/08/2020   Procedure: PLICATION OF ARTERIOVENOUS  FISTULA RIGHT ARM;  Surgeon: Chuck Hint, MD;  Location: [facility] OR;  Service: Vascular;  Laterality: Right;   LIGATION OF COMPETING BRANCHES OF ARTERIOVENOUS FISTULA Right 08/13/2013   Procedure: LIGATION OF COMPETING BRANCHES X5 OF ARTERIOVENOUS FISTULA- RIGHT ARM;  Surgeon: Nada Libman, MD;  Location: [facility] OR;  Service: Vascular;  Laterality: Right;   REVISON OF ARTERIOVENOUS FISTULA Right 08/07/2020   Procedure: PLICATION OF ARTERIOVENOUS FISTULA RIGHT;  Surgeon: Chuck Hint, MD;  Location: [facility] OR;  Service: Vascular;  Laterality: Right;   TEE WITHOUT CARDIOVERSION N/A 01/10/2020   Procedure: TRANSESOPHAGEAL ECHOCARDIOGRAM (TEE);  Surgeon: Marinus Maw, MD;  Location: [facility] OR;  Service: Cardiovascular;  Laterality: N/A;    Short Social History:  Social History   Tobacco Use   Smoking status: Former    Current packs/day: 0.00     Types: Cigarettes    Quit date: 10/28/2008    Years since quitting: 15.1   Smokeless tobacco: Never  Substance Use Topics   Alcohol use: No    No Known Allergies  Current Outpatient Medications  Medication Sig Dispense Refill   atorvastatin (LIPITOR) 20 MG tablet Take 1 tablet by mouth once daily 90 tablet 3   AURYXIA 1 GM 210 MG(Fe) tablet Take 210 mg by mouth 3 (three) times daily.      metoprolol succinate (TOPROL-XL) 50 MG 24 hr tablet Take 1 tablet by mouth once daily 90 tablet 2   No current facility-administered medications for this visit.    REVIEW OF SYSTEMS  Positive for ***  All other systems were reviewed and are negative     Objective:  Objective   There were no vitals filed for this visit. There is no height or weight on file to calculate BMI.  Physical Exam General: no acute distress Cardiac: hemodynamically stable Pulm: normal work of breathing Abdomen: non-tender, no pulsatile mass*** Neuro: alert, no focal deficit Extremities: no edema, cyanosis or wounds*** Vascular:   Right: ***  Left: ***   Data: Reviewed note from Paulene Floor, MD     Assessment/Plan:     James Nicholson is a 58 y.o. male with ESRD on HD via a right radiocephalic fistula that has had 2 aneurysm plications in the past by Dr. Durwin Nora in 2021 now with thin skin/ulceration overlying ***    Recommendations to optimize cardiovascular risk: Abstinence from all tobacco products. Blood glucose control with goal A1c < 7%. Blood pressure control with goal blood pressure < 140/90 mmHg. Lipid reduction therapy with goal LDL-C <100 mg/dL  Aspirin 81mg  PO QD.  Atorvastatin 40-80mg  PO QD (or other "high intensity" statin therapy).     Daria Pastures MD [facility]

## 2023-12-19 ENCOUNTER — Encounter: Payer: Self-pay | Admitting: Vascular Surgery

## 2023-12-19 ENCOUNTER — Ambulatory Visit: Payer: 59 | Admitting: Vascular Surgery

## 2023-12-19 VITALS — BP 118/72 | HR 107 | Temp 98.7°F | Resp 20 | Ht 69.0 in | Wt 172.0 lb

## 2023-12-19 DIAGNOSIS — N186 End stage renal disease: Secondary | ICD-10-CM

## 2023-12-20 DIAGNOSIS — D631 Anemia in chronic kidney disease: Secondary | ICD-10-CM | POA: Diagnosis not present

## 2023-12-20 DIAGNOSIS — D689 Coagulation defect, unspecified: Secondary | ICD-10-CM | POA: Diagnosis not present

## 2023-12-20 DIAGNOSIS — Z992 Dependence on renal dialysis: Secondary | ICD-10-CM | POA: Diagnosis not present

## 2023-12-20 DIAGNOSIS — E875 Hyperkalemia: Secondary | ICD-10-CM | POA: Diagnosis not present

## 2023-12-20 DIAGNOSIS — E1122 Type 2 diabetes mellitus with diabetic chronic kidney disease: Secondary | ICD-10-CM | POA: Diagnosis not present

## 2023-12-20 DIAGNOSIS — N186 End stage renal disease: Secondary | ICD-10-CM | POA: Diagnosis not present

## 2023-12-20 DIAGNOSIS — N2581 Secondary hyperparathyroidism of renal origin: Secondary | ICD-10-CM | POA: Diagnosis not present

## 2023-12-22 DIAGNOSIS — L400 Psoriasis vulgaris: Secondary | ICD-10-CM | POA: Diagnosis not present

## 2023-12-23 DIAGNOSIS — Z992 Dependence on renal dialysis: Secondary | ICD-10-CM | POA: Diagnosis not present

## 2023-12-23 DIAGNOSIS — E1122 Type 2 diabetes mellitus with diabetic chronic kidney disease: Secondary | ICD-10-CM | POA: Diagnosis not present

## 2023-12-23 DIAGNOSIS — N186 End stage renal disease: Secondary | ICD-10-CM | POA: Diagnosis not present

## 2023-12-23 DIAGNOSIS — D631 Anemia in chronic kidney disease: Secondary | ICD-10-CM | POA: Diagnosis not present

## 2023-12-23 DIAGNOSIS — D689 Coagulation defect, unspecified: Secondary | ICD-10-CM | POA: Diagnosis not present

## 2023-12-23 DIAGNOSIS — E875 Hyperkalemia: Secondary | ICD-10-CM | POA: Diagnosis not present

## 2023-12-23 DIAGNOSIS — N2581 Secondary hyperparathyroidism of renal origin: Secondary | ICD-10-CM | POA: Diagnosis not present

## 2023-12-24 DIAGNOSIS — H0589 Other disorders of orbit: Secondary | ICD-10-CM | POA: Diagnosis not present

## 2023-12-24 DIAGNOSIS — Z79620 Long term (current) use of immunosuppressive biologic: Secondary | ICD-10-CM | POA: Diagnosis not present

## 2023-12-24 DIAGNOSIS — I7782 Antineutrophilic cytoplasmic antibody (ANCA) vasculitis: Secondary | ICD-10-CM | POA: Diagnosis not present

## 2023-12-24 DIAGNOSIS — R21 Rash and other nonspecific skin eruption: Secondary | ICD-10-CM | POA: Diagnosis not present

## 2023-12-24 DIAGNOSIS — M3131 Wegener's granulomatosis with renal involvement: Secondary | ICD-10-CM | POA: Diagnosis not present

## 2023-12-25 DIAGNOSIS — D631 Anemia in chronic kidney disease: Secondary | ICD-10-CM | POA: Diagnosis not present

## 2023-12-25 DIAGNOSIS — E1122 Type 2 diabetes mellitus with diabetic chronic kidney disease: Secondary | ICD-10-CM | POA: Diagnosis not present

## 2023-12-25 DIAGNOSIS — N186 End stage renal disease: Secondary | ICD-10-CM | POA: Diagnosis not present

## 2023-12-25 DIAGNOSIS — D689 Coagulation defect, unspecified: Secondary | ICD-10-CM | POA: Diagnosis not present

## 2023-12-25 DIAGNOSIS — Z992 Dependence on renal dialysis: Secondary | ICD-10-CM | POA: Diagnosis not present

## 2023-12-25 DIAGNOSIS — N2581 Secondary hyperparathyroidism of renal origin: Secondary | ICD-10-CM | POA: Diagnosis not present

## 2023-12-25 DIAGNOSIS — E875 Hyperkalemia: Secondary | ICD-10-CM | POA: Diagnosis not present

## 2023-12-26 DIAGNOSIS — N032 Chronic nephritic syndrome with diffuse membranous glomerulonephritis: Secondary | ICD-10-CM | POA: Diagnosis not present

## 2023-12-26 DIAGNOSIS — N186 End stage renal disease: Secondary | ICD-10-CM | POA: Diagnosis not present

## 2023-12-26 DIAGNOSIS — Z992 Dependence on renal dialysis: Secondary | ICD-10-CM | POA: Diagnosis not present

## 2023-12-27 DIAGNOSIS — N186 End stage renal disease: Secondary | ICD-10-CM | POA: Diagnosis not present

## 2023-12-27 DIAGNOSIS — D689 Coagulation defect, unspecified: Secondary | ICD-10-CM | POA: Diagnosis not present

## 2023-12-27 DIAGNOSIS — D631 Anemia in chronic kidney disease: Secondary | ICD-10-CM | POA: Diagnosis not present

## 2023-12-27 DIAGNOSIS — N2581 Secondary hyperparathyroidism of renal origin: Secondary | ICD-10-CM | POA: Diagnosis not present

## 2023-12-27 DIAGNOSIS — E875 Hyperkalemia: Secondary | ICD-10-CM | POA: Diagnosis not present

## 2023-12-27 DIAGNOSIS — Z992 Dependence on renal dialysis: Secondary | ICD-10-CM | POA: Diagnosis not present

## 2023-12-27 DIAGNOSIS — E1122 Type 2 diabetes mellitus with diabetic chronic kidney disease: Secondary | ICD-10-CM | POA: Diagnosis not present

## 2023-12-30 DIAGNOSIS — Z992 Dependence on renal dialysis: Secondary | ICD-10-CM | POA: Diagnosis not present

## 2023-12-30 DIAGNOSIS — N186 End stage renal disease: Secondary | ICD-10-CM | POA: Diagnosis not present

## 2023-12-30 DIAGNOSIS — N2581 Secondary hyperparathyroidism of renal origin: Secondary | ICD-10-CM | POA: Diagnosis not present

## 2023-12-30 DIAGNOSIS — D689 Coagulation defect, unspecified: Secondary | ICD-10-CM | POA: Diagnosis not present

## 2023-12-30 DIAGNOSIS — D631 Anemia in chronic kidney disease: Secondary | ICD-10-CM | POA: Diagnosis not present

## 2023-12-30 DIAGNOSIS — E1122 Type 2 diabetes mellitus with diabetic chronic kidney disease: Secondary | ICD-10-CM | POA: Diagnosis not present

## 2023-12-30 DIAGNOSIS — E875 Hyperkalemia: Secondary | ICD-10-CM | POA: Diagnosis not present

## 2024-01-01 DIAGNOSIS — E1122 Type 2 diabetes mellitus with diabetic chronic kidney disease: Secondary | ICD-10-CM | POA: Diagnosis not present

## 2024-01-01 DIAGNOSIS — D631 Anemia in chronic kidney disease: Secondary | ICD-10-CM | POA: Diagnosis not present

## 2024-01-01 DIAGNOSIS — Z992 Dependence on renal dialysis: Secondary | ICD-10-CM | POA: Diagnosis not present

## 2024-01-01 DIAGNOSIS — E875 Hyperkalemia: Secondary | ICD-10-CM | POA: Diagnosis not present

## 2024-01-01 DIAGNOSIS — N186 End stage renal disease: Secondary | ICD-10-CM | POA: Diagnosis not present

## 2024-01-01 DIAGNOSIS — D689 Coagulation defect, unspecified: Secondary | ICD-10-CM | POA: Diagnosis not present

## 2024-01-01 DIAGNOSIS — N2581 Secondary hyperparathyroidism of renal origin: Secondary | ICD-10-CM | POA: Diagnosis not present

## 2024-01-01 NOTE — Progress Notes (Signed)
 Patient ID: James Nicholson, male   DOB: 10/13/1966, 58 y.o.   MRN: [id number]  Reason for Consult: Follow-up   Referred by Jackie Plum, MD  Subjective:     HPI  James Nicholson is a 58 y.o. male with ESRD and history of a right radiocephalic fistula that has been plicated for large aneurysms with Dr. Durwin Nora in July 2021 and October 2021.  I saw him a few weeks ago due to concern of an area proximal to his prior plications that was aneurysmal with skin irritation.  There is determined that he had a rash overlying his forearm and was scheduled for dermatology appointment the following week.  He presents today for follow-up. He reports his rash is much improved and is no longer itching.  He denies any recent issues with dialysis sessions or significant increase in size in his aneurysmal dilations of his radiocephalic fistula  Past Medical History:  Diagnosis Date   AICD (automatic cardioverter/defibrillator) present    Medtronic Visia AF MRIT VR [id number]   Anemia    Atrial fibrillation    Cardiomyopathy (HCC) 2010   Unclear Etiology: Last Echo 06/2009: EF 40-45%, severe Lateral & apical Hypokinesis (? CAD) ; Grade 2 DDysfxn. Mild conc LVH.    Cellulitis    Chronic kidney disease (CKD), stage V (HCC)    Dialysis Tue, Thurs, Sat   Confusion    Depression    no meds   Diabetes mellitus without complication (HCC)    type 2, diet controlled, no meds, pt. denies   Dyslipidemia    Enterobacter sepsis (HCC)    HLD (hyperlipidemia)    Hypertension    Pneumonia    S/P ICD (internal cardiac defibrillator) procedure 2010   VT Arrest (in [place])   Schizophrenia [facility])    no meds   Wegener's disease, pulmonary 02/05/2013   Wegener's granulomatosis    Family History  Problem Relation Age of Onset   CAD Mother    Past Surgical History:  Procedure Laterality Date   A/V FISTULAGRAM Right 03/17/2020   Procedure: A/V FISTULAGRAM;  Surgeon: Chuck Hint, MD;  Location: [facility]  INVASIVE CV LAB;  Service: Cardiovascular;  Laterality: Right;   AV FISTULA PLACEMENT Right 02/10/2013   Procedure: ARTERIOVENOUS (AV) FISTULA CREATION;  Surgeon: Larina Earthly, MD;  Location: [facility] OR;  Service: Vascular;  Laterality: Right;  Right forearm radial/cephalic arterovenous fistula.    CARDIAC CATHETERIZATION  2010   [place]: In setting of VT arrest. Per brother's report, nonobstructive with no intervention   CARDIAC DEFIBRILLATOR PLACEMENT  2010   [place]   EYE SURGERY Right    FISTULOGRAM Right 08/09/2013   Procedure: FISTULOGRAM;  Surgeon: Chuck Hint, MD;  Location: [facility] CATH LAB;  Service: Cardiovascular;  Laterality: Right;   ICD GENERATOR CHANGEOUT N/A 12/05/2017   Procedure: ICD GENERATOR CHANGEOUT;  Surgeon: Marinus Maw, MD;  Location: [facility] INVASIVE CV LAB;  Service: Cardiovascular;  Laterality: N/A;   ICD LEAD REMOVAL N/A 01/10/2020   Procedure: ICD LEAD REMOVAL AND IMPLANTATION OF NEW LEAD IN RIGHT VENTRICLE;  Surgeon: Marinus Maw, MD;  Location: [facility] OR;  Service: Cardiovascular;  Laterality: N/A;   INSERTION OF DIALYSIS CATHETER Right 08/07/2020   Procedure: INSERTION OF DIALYSIS CATHETER;  Surgeon: Chuck Hint, MD;  Location: [facility] OR;  Service: Vascular;  Laterality: Right;   LIGATION OF ARTERIOVENOUS  FISTULA Right 05/08/2020   Procedure: PLICATION OF ARTERIOVENOUS  FISTULA RIGHT ARM;  Surgeon: Chuck Hint, MD;  Location: [facility] OR;  Service: Vascular;  Laterality: Right;   LIGATION OF COMPETING BRANCHES OF ARTERIOVENOUS FISTULA Right 08/13/2013   Procedure: LIGATION OF COMPETING BRANCHES X5 OF ARTERIOVENOUS FISTULA- RIGHT ARM;  Surgeon: Nada Libman, MD;  Location: [facility] OR;  Service: Vascular;  Laterality: Right;   REVISON OF ARTERIOVENOUS FISTULA Right 08/07/2020   Procedure: PLICATION OF ARTERIOVENOUS FISTULA RIGHT;  Surgeon: Chuck Hint, MD;  Location: [facility] OR;  Service: Vascular;  Laterality: Right;   TEE WITHOUT CARDIOVERSION N/A  01/10/2020   Procedure: TRANSESOPHAGEAL ECHOCARDIOGRAM (TEE);  Surgeon: Marinus Maw, MD;  Location: [facility] OR;  Service: Cardiovascular;  Laterality: N/A;    Short Social History:  Social History   Tobacco Use   Smoking status: Former    Current packs/day: 0.00    Types: Cigarettes    Quit date: 10/28/2008    Years since quitting: 15.1   Smokeless tobacco: Never  Substance Use Topics   Alcohol use: No    No Known Allergies  Current Outpatient Medications  Medication Sig Dispense Refill   atorvastatin (LIPITOR) 20 MG tablet Take 1 tablet by mouth once daily 90 tablet 3   AURYXIA 1 GM 210 MG(Fe) tablet Take 210 mg by mouth 3 (three) times daily.      clotrimazole (LOTRIMIN) 1 % external solution Apply topically.     metoprolol succinate (TOPROL-XL) 50 MG 24 hr tablet Take 1 tablet by mouth once daily 90 tablet 2   Current Facility-Administered Medications  Medication Dose Route Frequency Provider Last Rate Last Admin   sodium chloride flush (NS) 0.9 % injection 3 mL  3 mL Intravenous PRN Daria Pastures, MD        REVIEW OF SYSTEMS   All other systems were reviewed and are negative     Objective:  Objective   Vitals:   01/02/24 0900  BP: 105/73  Pulse: (!) 101  Resp: 20  Temp: 98.9 F (37.2 C)  SpO2: 98%  Weight: 168 lb (76.2 kg)  Height: 5\' 9"  (1.753 m)   Body mass index is 24.81 kg/m.  Physical Exam Physical Exam General: no acute distress Cardiac: hemodynamically stable Pulm: normal work of breathing Neuro: alert, no focal deficit Extremities: improved Red eczematous rash overlying right forearm.  2 moderately sized aneurysm of the right radiocephalic aneurysm.  Skin intact no ulceration present, some mild skin thinning Vascular:              Palpable radial pulse on right and palpable thrill in radiocephalic fistula      Assessment/Plan:     James Nicholson is a 58 y.o. male male with ESRD on HD via a right radiocephalic fistula presenting for  follow-up of 2 moderately sized aneurysms with concern for skin irritation.  Now that the rash has been appropriately treated the skin looks healthy and although mildly thin in these areas I did not believe that this necessitates a revision. I explained that aneurysm formation could be due to a venous outflow obstruction and recommended fistulogram.  Risks and benefits were reviewed, patient and brother expressed understanding and elected to proceed.  Will plan for fistulogram on 01/12/2024     Daria Pastures MD [facility]

## 2024-01-01 NOTE — H&P (View-Only) (Signed)
 Patient ID: James Nicholson, male   DOB: 10/13/1966, 58 y.o.   MRN: [id number]  Reason for Consult: Follow-up   Referred by Jackie Plum, MD  Subjective:     HPI  James Nicholson is a 58 y.o. male with ESRD and history of a right radiocephalic fistula that has been plicated for large aneurysms with Dr. Durwin Nora in July 2021 and October 2021.  I saw him a few weeks ago due to concern of an area proximal to his prior plications that was aneurysmal with skin irritation.  There is determined that he had a rash overlying his forearm and was scheduled for dermatology appointment the following week.  He presents today for follow-up. He reports his rash is much improved and is no longer itching.  He denies any recent issues with dialysis sessions or significant increase in size in his aneurysmal dilations of his radiocephalic fistula  Past Medical History:  Diagnosis Date   AICD (automatic cardioverter/defibrillator) present    Medtronic Visia AF MRIT VR [id number]   Anemia    Atrial fibrillation    Cardiomyopathy (HCC) 2010   Unclear Etiology: Last Echo 06/2009: EF 40-45%, severe Lateral & apical Hypokinesis (? CAD) ; Grade 2 DDysfxn. Mild conc LVH.    Cellulitis    Chronic kidney disease (CKD), stage V (HCC)    Dialysis Tue, Thurs, Sat   Confusion    Depression    no meds   Diabetes mellitus without complication (HCC)    type 2, diet controlled, no meds, pt. denies   Dyslipidemia    Enterobacter sepsis (HCC)    HLD (hyperlipidemia)    Hypertension    Pneumonia    S/P ICD (internal cardiac defibrillator) procedure 2010   VT Arrest (in [place])   Schizophrenia [facility])    no meds   Wegener's disease, pulmonary 02/05/2013   Wegener's granulomatosis    Family History  Problem Relation Age of Onset   CAD Mother    Past Surgical History:  Procedure Laterality Date   A/V FISTULAGRAM Right 03/17/2020   Procedure: A/V FISTULAGRAM;  Surgeon: Chuck Hint, MD;  Location: [facility]  INVASIVE CV LAB;  Service: Cardiovascular;  Laterality: Right;   AV FISTULA PLACEMENT Right 02/10/2013   Procedure: ARTERIOVENOUS (AV) FISTULA CREATION;  Surgeon: Larina Earthly, MD;  Location: [facility] OR;  Service: Vascular;  Laterality: Right;  Right forearm radial/cephalic arterovenous fistula.    CARDIAC CATHETERIZATION  2010   [place]: In setting of VT arrest. Per brother's report, nonobstructive with no intervention   CARDIAC DEFIBRILLATOR PLACEMENT  2010   [place]   EYE SURGERY Right    FISTULOGRAM Right 08/09/2013   Procedure: FISTULOGRAM;  Surgeon: Chuck Hint, MD;  Location: [facility] CATH LAB;  Service: Cardiovascular;  Laterality: Right;   ICD GENERATOR CHANGEOUT N/A 12/05/2017   Procedure: ICD GENERATOR CHANGEOUT;  Surgeon: Marinus Maw, MD;  Location: [facility] INVASIVE CV LAB;  Service: Cardiovascular;  Laterality: N/A;   ICD LEAD REMOVAL N/A 01/10/2020   Procedure: ICD LEAD REMOVAL AND IMPLANTATION OF NEW LEAD IN RIGHT VENTRICLE;  Surgeon: Marinus Maw, MD;  Location: [facility] OR;  Service: Cardiovascular;  Laterality: N/A;   INSERTION OF DIALYSIS CATHETER Right 08/07/2020   Procedure: INSERTION OF DIALYSIS CATHETER;  Surgeon: Chuck Hint, MD;  Location: [facility] OR;  Service: Vascular;  Laterality: Right;   LIGATION OF ARTERIOVENOUS  FISTULA Right 05/08/2020   Procedure: PLICATION OF ARTERIOVENOUS  FISTULA RIGHT ARM;  Surgeon: Chuck Hint, MD;  Location: [facility] OR;  Service: Vascular;  Laterality: Right;   LIGATION OF COMPETING BRANCHES OF ARTERIOVENOUS FISTULA Right 08/13/2013   Procedure: LIGATION OF COMPETING BRANCHES X5 OF ARTERIOVENOUS FISTULA- RIGHT ARM;  Surgeon: Nada Libman, MD;  Location: [facility] OR;  Service: Vascular;  Laterality: Right;   REVISON OF ARTERIOVENOUS FISTULA Right 08/07/2020   Procedure: PLICATION OF ARTERIOVENOUS FISTULA RIGHT;  Surgeon: Chuck Hint, MD;  Location: [facility] OR;  Service: Vascular;  Laterality: Right;   TEE WITHOUT CARDIOVERSION N/A  01/10/2020   Procedure: TRANSESOPHAGEAL ECHOCARDIOGRAM (TEE);  Surgeon: Marinus Maw, MD;  Location: [facility] OR;  Service: Cardiovascular;  Laterality: N/A;    Short Social History:  Social History   Tobacco Use   Smoking status: Former    Current packs/day: 0.00    Types: Cigarettes    Quit date: 10/28/2008    Years since quitting: 15.1   Smokeless tobacco: Never  Substance Use Topics   Alcohol use: No    No Known Allergies  Current Outpatient Medications  Medication Sig Dispense Refill   atorvastatin (LIPITOR) 20 MG tablet Take 1 tablet by mouth once daily 90 tablet 3   AURYXIA 1 GM 210 MG(Fe) tablet Take 210 mg by mouth 3 (three) times daily.      clotrimazole (LOTRIMIN) 1 % external solution Apply topically.     metoprolol succinate (TOPROL-XL) 50 MG 24 hr tablet Take 1 tablet by mouth once daily 90 tablet 2   Current Facility-Administered Medications  Medication Dose Route Frequency Provider Last Rate Last Admin   sodium chloride flush (NS) 0.9 % injection 3 mL  3 mL Intravenous PRN Daria Pastures, MD        REVIEW OF SYSTEMS   All other systems were reviewed and are negative     Objective:  Objective   Vitals:   01/02/24 0900  BP: 105/73  Pulse: (!) 101  Resp: 20  Temp: 98.9 F (37.2 C)  SpO2: 98%  Weight: 168 lb (76.2 kg)  Height: 5\' 9"  (1.753 m)   Body mass index is 24.81 kg/m.  Physical Exam Physical Exam General: no acute distress Cardiac: hemodynamically stable Pulm: normal work of breathing Neuro: alert, no focal deficit Extremities: improved Red eczematous rash overlying right forearm.  2 moderately sized aneurysm of the right radiocephalic aneurysm.  Skin intact no ulceration present, some mild skin thinning Vascular:              Palpable radial pulse on right and palpable thrill in radiocephalic fistula      Assessment/Plan:     James Nicholson is a 58 y.o. male male with ESRD on HD via a right radiocephalic fistula presenting for  follow-up of 2 moderately sized aneurysms with concern for skin irritation.  Now that the rash has been appropriately treated the skin looks healthy and although mildly thin in these areas I did not believe that this necessitates a revision. I explained that aneurysm formation could be due to a venous outflow obstruction and recommended fistulogram.  Risks and benefits were reviewed, patient and brother expressed understanding and elected to proceed.  Will plan for fistulogram on 01/12/2024     Daria Pastures MD [facility]

## 2024-01-02 ENCOUNTER — Ambulatory Visit: Payer: 59 | Admitting: Vascular Surgery

## 2024-01-02 ENCOUNTER — Other Ambulatory Visit: Payer: Self-pay

## 2024-01-02 ENCOUNTER — Encounter: Payer: Self-pay | Admitting: Vascular Surgery

## 2024-01-02 VITALS — BP 105/73 | HR 101 | Temp 98.9°F | Resp 20 | Ht 69.0 in | Wt 168.0 lb

## 2024-01-02 DIAGNOSIS — N186 End stage renal disease: Secondary | ICD-10-CM

## 2024-01-02 MED ORDER — SODIUM CHLORIDE 0.9% FLUSH: 3.0000 mL | INTRAVENOUS | Status: AC | PRN

## 2024-01-03 DIAGNOSIS — E1122 Type 2 diabetes mellitus with diabetic chronic kidney disease: Secondary | ICD-10-CM | POA: Diagnosis not present

## 2024-01-03 DIAGNOSIS — N2581 Secondary hyperparathyroidism of renal origin: Secondary | ICD-10-CM | POA: Diagnosis not present

## 2024-01-03 DIAGNOSIS — Z992 Dependence on renal dialysis: Secondary | ICD-10-CM | POA: Diagnosis not present

## 2024-01-03 DIAGNOSIS — D631 Anemia in chronic kidney disease: Secondary | ICD-10-CM | POA: Diagnosis not present

## 2024-01-03 DIAGNOSIS — E875 Hyperkalemia: Secondary | ICD-10-CM | POA: Diagnosis not present

## 2024-01-03 DIAGNOSIS — D689 Coagulation defect, unspecified: Secondary | ICD-10-CM | POA: Diagnosis not present

## 2024-01-03 DIAGNOSIS — N186 End stage renal disease: Secondary | ICD-10-CM | POA: Diagnosis not present

## 2024-01-06 DIAGNOSIS — Z992 Dependence on renal dialysis: Secondary | ICD-10-CM | POA: Diagnosis not present

## 2024-01-06 DIAGNOSIS — D631 Anemia in chronic kidney disease: Secondary | ICD-10-CM | POA: Diagnosis not present

## 2024-01-06 DIAGNOSIS — E875 Hyperkalemia: Secondary | ICD-10-CM | POA: Diagnosis not present

## 2024-01-06 DIAGNOSIS — D689 Coagulation defect, unspecified: Secondary | ICD-10-CM | POA: Diagnosis not present

## 2024-01-06 DIAGNOSIS — E1122 Type 2 diabetes mellitus with diabetic chronic kidney disease: Secondary | ICD-10-CM | POA: Diagnosis not present

## 2024-01-06 DIAGNOSIS — N186 End stage renal disease: Secondary | ICD-10-CM | POA: Diagnosis not present

## 2024-01-06 DIAGNOSIS — N2581 Secondary hyperparathyroidism of renal origin: Secondary | ICD-10-CM | POA: Diagnosis not present

## 2024-01-08 ENCOUNTER — Ambulatory Visit: Payer: 59

## 2024-01-08 DIAGNOSIS — N186 End stage renal disease: Secondary | ICD-10-CM | POA: Diagnosis not present

## 2024-01-08 DIAGNOSIS — Z992 Dependence on renal dialysis: Secondary | ICD-10-CM | POA: Diagnosis not present

## 2024-01-08 DIAGNOSIS — E875 Hyperkalemia: Secondary | ICD-10-CM | POA: Diagnosis not present

## 2024-01-08 DIAGNOSIS — D631 Anemia in chronic kidney disease: Secondary | ICD-10-CM | POA: Diagnosis not present

## 2024-01-08 DIAGNOSIS — I255 Ischemic cardiomyopathy: Secondary | ICD-10-CM

## 2024-01-08 DIAGNOSIS — E1122 Type 2 diabetes mellitus with diabetic chronic kidney disease: Secondary | ICD-10-CM | POA: Diagnosis not present

## 2024-01-08 DIAGNOSIS — N2581 Secondary hyperparathyroidism of renal origin: Secondary | ICD-10-CM | POA: Diagnosis not present

## 2024-01-08 DIAGNOSIS — D689 Coagulation defect, unspecified: Secondary | ICD-10-CM | POA: Diagnosis not present

## 2024-01-08 LAB — CUP PACEART REMOTE DEVICE CHECK
Battery Remaining Longevity: 46 mo
Battery Voltage: 2.95 V
Brady Statistic RV Percent Paced: 0.01 %
Date Time Interrogation Session: 20250313042208
HighPow Impedance: 254 Ohm
HighPow Impedance: 51 Ohm
Implantable Lead Connection Status: 753985
Implantable Lead Implant Date: 20210315
Implantable Lead Location: 753860
Implantable Lead Model: 6935
Implantable Pulse Generator Implant Date: 20190208
Lead Channel Impedance Value: 285 Ohm
Lead Channel Impedance Value: 361 Ohm
Lead Channel Pacing Threshold Amplitude: 0.5 V
Lead Channel Pacing Threshold Pulse Width: 0.4 ms
Lead Channel Sensing Intrinsic Amplitude: 8.125 mV
Lead Channel Sensing Intrinsic Amplitude: 8.125 mV
Lead Channel Setting Pacing Amplitude: 2 V
Lead Channel Setting Pacing Pulse Width: 0.4 ms
Lead Channel Setting Sensing Sensitivity: 0.3 mV
Zone Setting Status: 755011

## 2024-01-10 DIAGNOSIS — D631 Anemia in chronic kidney disease: Secondary | ICD-10-CM | POA: Diagnosis not present

## 2024-01-10 DIAGNOSIS — N2581 Secondary hyperparathyroidism of renal origin: Secondary | ICD-10-CM | POA: Diagnosis not present

## 2024-01-10 DIAGNOSIS — Z992 Dependence on renal dialysis: Secondary | ICD-10-CM | POA: Diagnosis not present

## 2024-01-10 DIAGNOSIS — E1122 Type 2 diabetes mellitus with diabetic chronic kidney disease: Secondary | ICD-10-CM | POA: Diagnosis not present

## 2024-01-10 DIAGNOSIS — N186 End stage renal disease: Secondary | ICD-10-CM | POA: Diagnosis not present

## 2024-01-10 DIAGNOSIS — D689 Coagulation defect, unspecified: Secondary | ICD-10-CM | POA: Diagnosis not present

## 2024-01-10 DIAGNOSIS — E875 Hyperkalemia: Secondary | ICD-10-CM | POA: Diagnosis not present

## 2024-01-12 ENCOUNTER — Other Ambulatory Visit: Payer: Self-pay

## 2024-01-12 ENCOUNTER — Encounter (HOSPITAL_COMMUNITY)
Admission: RE | Disposition: A | Discharge status: Home / Self Care | Payer: Self-pay | Source: Home / Self Care | Attending: Vascular Surgery

## 2024-01-12 ENCOUNTER — Encounter (HOSPITAL_COMMUNITY): Payer: Self-pay | Admitting: Vascular Surgery

## 2024-01-12 ENCOUNTER — Ambulatory Visit (HOSPITAL_COMMUNITY)
Admission: RE | Admit: 2024-01-12 | Discharge: 2024-01-12 | Disposition: A | Discharge status: Home / Self Care | Attending: Vascular Surgery | Admitting: Vascular Surgery

## 2024-01-12 DIAGNOSIS — T82898A Other specified complication of vascular prosthetic devices, implants and grafts, initial encounter: Secondary | ICD-10-CM | POA: Diagnosis not present

## 2024-01-12 DIAGNOSIS — T82858A Stenosis of vascular prosthetic devices, implants and grafts, initial encounter: Secondary | ICD-10-CM

## 2024-01-12 DIAGNOSIS — Y832 Surgical operation with anastomosis, bypass or graft as the cause of abnormal reaction of the patient, or of later complication, without mention of misadventure at the time of the procedure: Secondary | ICD-10-CM | POA: Insufficient documentation

## 2024-01-12 DIAGNOSIS — N185 Chronic kidney disease, stage 5: Secondary | ICD-10-CM | POA: Diagnosis not present

## 2024-01-12 DIAGNOSIS — Z87891 Personal history of nicotine dependence: Secondary | ICD-10-CM | POA: Insufficient documentation

## 2024-01-12 DIAGNOSIS — E1122 Type 2 diabetes mellitus with diabetic chronic kidney disease: Secondary | ICD-10-CM | POA: Diagnosis not present

## 2024-01-12 DIAGNOSIS — Z992 Dependence on renal dialysis: Secondary | ICD-10-CM | POA: Diagnosis not present

## 2024-01-12 DIAGNOSIS — I120 Hypertensive chronic kidney disease with stage 5 chronic kidney disease or end stage renal disease: Secondary | ICD-10-CM | POA: Diagnosis not present

## 2024-01-12 DIAGNOSIS — N186 End stage renal disease: Secondary | ICD-10-CM | POA: Insufficient documentation

## 2024-01-12 LAB — POCT I-STAT, CHEM 8
BUN: 50 mg/dL — ABNORMAL HIGH (ref 6–20)
Calcium, Ion: 1.14 mmol/L — ABNORMAL LOW (ref 1.15–1.40)
Chloride: 104 mmol/L (ref 98–111)
Creatinine, Ser: 12.8 mg/dL — ABNORMAL HIGH (ref 0.61–1.24)
Glucose, Bld: 86 mg/dL (ref 70–99)
HCT: 38 % — ABNORMAL LOW (ref 39.0–52.0)
Hemoglobin: 12.9 g/dL — ABNORMAL LOW (ref 13.0–17.0)
Potassium: 4.7 mmol/L (ref 3.5–5.1)
Sodium: 138 mmol/L (ref 135–145)
TCO2: 28 mmol/L (ref 22–32)

## 2024-01-12 SURGERY — A/V FISTULAGRAM
Anesthesia: LOCAL | Site: Arm Lower | Laterality: Right

## 2024-01-12 MED ORDER — MIDAZOLAM HCL 2 MG/2ML IJ SOLN
INTRAMUSCULAR | Status: AC
  Filled 2024-01-12: qty 2

## 2024-01-12 MED ORDER — FENTANYL CITRATE (PF) 100 MCG/2ML IJ SOLN
INTRAMUSCULAR | Status: AC
  Filled 2024-01-12: qty 2

## 2024-01-12 MED ORDER — HEPARIN (PORCINE) IN NACL 1000-0.9 UT/500ML-% IV SOLN
INTRAVENOUS | Status: DC | PRN
  Administered 2024-01-12: 1000 mL

## 2024-01-12 MED ORDER — FENTANYL CITRATE (PF) 100 MCG/2ML IJ SOLN
INTRAMUSCULAR | Status: DC | PRN
  Administered 2024-01-12: 50 ug via INTRAVENOUS

## 2024-01-12 MED ORDER — LIDOCAINE HCL (PF) 1 % IJ SOLN
INTRAMUSCULAR | Status: DC | PRN
  Administered 2024-01-12: 10 mL

## 2024-01-12 MED ORDER — LIDOCAINE HCL (PF) 1 % IJ SOLN
INTRAMUSCULAR | Status: AC
  Filled 2024-01-12: qty 30

## 2024-01-12 MED ORDER — IODIXANOL 320 MG/ML IV SOLN
INTRAVENOUS | Status: DC | PRN
  Administered 2024-01-12: 25 mL

## 2024-01-12 MED ORDER — MIDAZOLAM HCL 2 MG/2ML IJ SOLN
INTRAMUSCULAR | Status: DC | PRN
  Administered 2024-01-12: 1 mg via INTRAVENOUS

## 2024-01-12 SURGICAL SUPPLY — 14 items
BAG SNAP BAND KOVER 36X36 (MISCELLANEOUS) ×2 IMPLANT
BALLN LUTONIX AV 8X60X75 (BALLOONS) ×2 IMPLANT
BALLN MUSTANG 6.0X40 75 (BALLOONS) ×2 IMPLANT
BALLOON LUTONIX AV 8X60X75 (BALLOONS) IMPLANT
BALLOON MUSTANG 6.0X40 75 (BALLOONS) IMPLANT
COVER DOME SNAP 22 D (MISCELLANEOUS) ×2 IMPLANT
GUIDEWIRE ANGLED .035X150CM (WIRE) IMPLANT
KIT ENCORE 26 ADVANTAGE (KITS) IMPLANT
KIT MICROPUNCTURE NIT STIFF (SHEATH) IMPLANT
SET ATX-X65L (MISCELLANEOUS) IMPLANT
SHEATH PINNACLE R/O II 6F 4CM (SHEATH) IMPLANT
SHEATH PROBE COVER 6X72 (BAG) ×2 IMPLANT
TRAY PV CATH (CUSTOM PROCEDURE TRAY) ×2 IMPLANT
TUBING CIL FLEX 10 FLL-RA (TUBING) ×2 IMPLANT

## 2024-01-12 NOTE — Op Note (Addendum)
    Patient name: Kenan Moodie MRN: [id number] DOB: Jan 21, 1966 Sex: male  01/12/2024 Pre-operative Diagnosis: R arm RC AVF with aneurysmal dilations  Post-operative diagnosis:  Same Surgeon:  Daria Pastures, MD Procedure Performed:  Ultrasound-guided access of right radiocephalic fistula Fistulogram and central venogram Balloon angioplasty of radiocephalic fistula stenosis with 6 mm Mustang Drug-coated balloon angioplasty of radiocephalic fistula stenosis, 8 x 60 Lutonix 29 minutes moderate sedation with fentanyl and Versed  Indications: Patient is a 58 year old male with ESRD and history of a right radiocephalic fistula that has been plicated for large aneurysms with Dr. Durwin Nora in the past.  He now has 2 areas of aneurysmal dilation in the proximal aspect, the wrist.  I explained these could be due to outflow obstruction and offered fistulogram with intervention.  Risks and benefits were reviewed, he and his brother expressed understanding and elected to proceed.  Findings:  2 areas in the proximal segment of the fistula with severe approximate 70% stenosis, both areas are directly distal to the 2 moderate-sized aneurysms.  Widely patent central venous system and widely patent outflow vein.   Procedure:  The patient was identified in the holding area and taken to the cath lab  The patient was then placed supine on the table and prepped and draped in the usual sterile fashion.  A time out was called.  Ultrasound was used to evaluate the right arm AV access. This was accessed under u/s guidance. An 018 wire was advanced without resistance, a micropuncture sheath was placed and fistulagram obtained which demonstrated the above findings.  This access was then upsized to a 6 F short sheath over a glidewire.  The stenoses were crossed with a Glidewire and treated with a 6 mm Mustang.  There was mild improvement but still residual stenosis.  An 8 x 60 Lutonix DCB was used to treat these areas, it  spanned the length of both stenoses.  Completion angiography demonstrated an adequate result with less than 40% residual stenosis and improvement in the pulsatility.  The access site was closed with a Monocryl suture and sheath was pulled with excellent hemostasis.  Contrast: 25 cc Sedation: 29 minutes   Daria Pastures MD [facility] [place] Office: [phone]

## 2024-01-12 NOTE — Interval H&P Note (Signed)
 History and Physical Interval Note:  01/12/2024 7:17 AM  James Nicholson  has presented today for surgery, with the diagnosis of end stage renal disease.  The various methods of treatment have been discussed with the patient and family. After consideration of risks, benefits and other options for treatment, the patient has consented to  Procedure(s): A/V Fistulagram (Right) as a surgical intervention.  The patient's history has been reviewed, patient examined, no change in status, stable for surgery.  I have reviewed the patient's chart and labs.  Questions were answered to the patient's satisfaction.     Daria Pastures

## 2024-01-12 NOTE — Progress Notes (Signed)
Patient and brother was given discharge instructions. Both verbalized understanding. ?

## 2024-01-13 DIAGNOSIS — N186 End stage renal disease: Secondary | ICD-10-CM | POA: Diagnosis not present

## 2024-01-13 DIAGNOSIS — D631 Anemia in chronic kidney disease: Secondary | ICD-10-CM | POA: Diagnosis not present

## 2024-01-13 DIAGNOSIS — E875 Hyperkalemia: Secondary | ICD-10-CM | POA: Diagnosis not present

## 2024-01-13 DIAGNOSIS — Z992 Dependence on renal dialysis: Secondary | ICD-10-CM | POA: Diagnosis not present

## 2024-01-13 DIAGNOSIS — N2581 Secondary hyperparathyroidism of renal origin: Secondary | ICD-10-CM | POA: Diagnosis not present

## 2024-01-13 DIAGNOSIS — E1122 Type 2 diabetes mellitus with diabetic chronic kidney disease: Secondary | ICD-10-CM | POA: Diagnosis not present

## 2024-01-13 DIAGNOSIS — D689 Coagulation defect, unspecified: Secondary | ICD-10-CM | POA: Diagnosis not present

## 2024-01-15 DIAGNOSIS — E1122 Type 2 diabetes mellitus with diabetic chronic kidney disease: Secondary | ICD-10-CM | POA: Diagnosis not present

## 2024-01-15 DIAGNOSIS — D689 Coagulation defect, unspecified: Secondary | ICD-10-CM | POA: Diagnosis not present

## 2024-01-15 DIAGNOSIS — N2581 Secondary hyperparathyroidism of renal origin: Secondary | ICD-10-CM | POA: Diagnosis not present

## 2024-01-15 DIAGNOSIS — Z992 Dependence on renal dialysis: Secondary | ICD-10-CM | POA: Diagnosis not present

## 2024-01-15 DIAGNOSIS — E875 Hyperkalemia: Secondary | ICD-10-CM | POA: Diagnosis not present

## 2024-01-15 DIAGNOSIS — N186 End stage renal disease: Secondary | ICD-10-CM | POA: Diagnosis not present

## 2024-01-15 DIAGNOSIS — D631 Anemia in chronic kidney disease: Secondary | ICD-10-CM | POA: Diagnosis not present

## 2024-01-17 DIAGNOSIS — D631 Anemia in chronic kidney disease: Secondary | ICD-10-CM | POA: Diagnosis not present

## 2024-01-17 DIAGNOSIS — E1122 Type 2 diabetes mellitus with diabetic chronic kidney disease: Secondary | ICD-10-CM | POA: Diagnosis not present

## 2024-01-17 DIAGNOSIS — N2581 Secondary hyperparathyroidism of renal origin: Secondary | ICD-10-CM | POA: Diagnosis not present

## 2024-01-17 DIAGNOSIS — N186 End stage renal disease: Secondary | ICD-10-CM | POA: Diagnosis not present

## 2024-01-17 DIAGNOSIS — Z992 Dependence on renal dialysis: Secondary | ICD-10-CM | POA: Diagnosis not present

## 2024-01-17 DIAGNOSIS — D689 Coagulation defect, unspecified: Secondary | ICD-10-CM | POA: Diagnosis not present

## 2024-01-17 DIAGNOSIS — E875 Hyperkalemia: Secondary | ICD-10-CM | POA: Diagnosis not present

## 2024-01-20 DIAGNOSIS — Z992 Dependence on renal dialysis: Secondary | ICD-10-CM | POA: Diagnosis not present

## 2024-01-20 DIAGNOSIS — D689 Coagulation defect, unspecified: Secondary | ICD-10-CM | POA: Diagnosis not present

## 2024-01-20 DIAGNOSIS — D631 Anemia in chronic kidney disease: Secondary | ICD-10-CM | POA: Diagnosis not present

## 2024-01-20 DIAGNOSIS — N186 End stage renal disease: Secondary | ICD-10-CM | POA: Diagnosis not present

## 2024-01-20 DIAGNOSIS — E875 Hyperkalemia: Secondary | ICD-10-CM | POA: Diagnosis not present

## 2024-01-20 DIAGNOSIS — N2581 Secondary hyperparathyroidism of renal origin: Secondary | ICD-10-CM | POA: Diagnosis not present

## 2024-01-20 DIAGNOSIS — E1122 Type 2 diabetes mellitus with diabetic chronic kidney disease: Secondary | ICD-10-CM | POA: Diagnosis not present

## 2024-01-22 DIAGNOSIS — D631 Anemia in chronic kidney disease: Secondary | ICD-10-CM | POA: Diagnosis not present

## 2024-01-22 DIAGNOSIS — D689 Coagulation defect, unspecified: Secondary | ICD-10-CM | POA: Diagnosis not present

## 2024-01-22 DIAGNOSIS — N186 End stage renal disease: Secondary | ICD-10-CM | POA: Diagnosis not present

## 2024-01-22 DIAGNOSIS — Z992 Dependence on renal dialysis: Secondary | ICD-10-CM | POA: Diagnosis not present

## 2024-01-22 DIAGNOSIS — E1122 Type 2 diabetes mellitus with diabetic chronic kidney disease: Secondary | ICD-10-CM | POA: Diagnosis not present

## 2024-01-22 DIAGNOSIS — E875 Hyperkalemia: Secondary | ICD-10-CM | POA: Diagnosis not present

## 2024-01-22 DIAGNOSIS — N2581 Secondary hyperparathyroidism of renal origin: Secondary | ICD-10-CM | POA: Diagnosis not present

## 2024-01-23 DIAGNOSIS — N186 End stage renal disease: Secondary | ICD-10-CM | POA: Diagnosis not present

## 2024-01-24 DIAGNOSIS — D631 Anemia in chronic kidney disease: Secondary | ICD-10-CM | POA: Diagnosis not present

## 2024-01-24 DIAGNOSIS — E875 Hyperkalemia: Secondary | ICD-10-CM | POA: Diagnosis not present

## 2024-01-24 DIAGNOSIS — Z992 Dependence on renal dialysis: Secondary | ICD-10-CM | POA: Diagnosis not present

## 2024-01-24 DIAGNOSIS — N186 End stage renal disease: Secondary | ICD-10-CM | POA: Diagnosis not present

## 2024-01-24 DIAGNOSIS — D689 Coagulation defect, unspecified: Secondary | ICD-10-CM | POA: Diagnosis not present

## 2024-01-24 DIAGNOSIS — E1122 Type 2 diabetes mellitus with diabetic chronic kidney disease: Secondary | ICD-10-CM | POA: Diagnosis not present

## 2024-01-24 DIAGNOSIS — N2581 Secondary hyperparathyroidism of renal origin: Secondary | ICD-10-CM | POA: Diagnosis not present

## 2024-01-26 DIAGNOSIS — Z992 Dependence on renal dialysis: Secondary | ICD-10-CM | POA: Diagnosis not present

## 2024-01-26 DIAGNOSIS — N032 Chronic nephritic syndrome with diffuse membranous glomerulonephritis: Secondary | ICD-10-CM | POA: Diagnosis not present

## 2024-01-26 DIAGNOSIS — N186 End stage renal disease: Secondary | ICD-10-CM | POA: Diagnosis not present

## 2024-01-27 DIAGNOSIS — N186 End stage renal disease: Secondary | ICD-10-CM | POA: Diagnosis not present

## 2024-01-27 DIAGNOSIS — D631 Anemia in chronic kidney disease: Secondary | ICD-10-CM | POA: Diagnosis not present

## 2024-01-27 DIAGNOSIS — N2581 Secondary hyperparathyroidism of renal origin: Secondary | ICD-10-CM | POA: Diagnosis not present

## 2024-01-27 DIAGNOSIS — D689 Coagulation defect, unspecified: Secondary | ICD-10-CM | POA: Diagnosis not present

## 2024-01-27 DIAGNOSIS — E1122 Type 2 diabetes mellitus with diabetic chronic kidney disease: Secondary | ICD-10-CM | POA: Diagnosis not present

## 2024-01-27 DIAGNOSIS — Z992 Dependence on renal dialysis: Secondary | ICD-10-CM | POA: Diagnosis not present

## 2024-01-27 DIAGNOSIS — E875 Hyperkalemia: Secondary | ICD-10-CM | POA: Diagnosis not present

## 2024-01-29 DIAGNOSIS — N186 End stage renal disease: Secondary | ICD-10-CM | POA: Diagnosis not present

## 2024-01-29 DIAGNOSIS — E875 Hyperkalemia: Secondary | ICD-10-CM | POA: Diagnosis not present

## 2024-01-29 DIAGNOSIS — Z992 Dependence on renal dialysis: Secondary | ICD-10-CM | POA: Diagnosis not present

## 2024-01-29 DIAGNOSIS — E1122 Type 2 diabetes mellitus with diabetic chronic kidney disease: Secondary | ICD-10-CM | POA: Diagnosis not present

## 2024-01-29 DIAGNOSIS — N2581 Secondary hyperparathyroidism of renal origin: Secondary | ICD-10-CM | POA: Diagnosis not present

## 2024-01-29 DIAGNOSIS — D631 Anemia in chronic kidney disease: Secondary | ICD-10-CM | POA: Diagnosis not present

## 2024-01-29 DIAGNOSIS — D689 Coagulation defect, unspecified: Secondary | ICD-10-CM | POA: Diagnosis not present

## 2024-01-31 DIAGNOSIS — E1122 Type 2 diabetes mellitus with diabetic chronic kidney disease: Secondary | ICD-10-CM | POA: Diagnosis not present

## 2024-01-31 DIAGNOSIS — E875 Hyperkalemia: Secondary | ICD-10-CM | POA: Diagnosis not present

## 2024-01-31 DIAGNOSIS — N2581 Secondary hyperparathyroidism of renal origin: Secondary | ICD-10-CM | POA: Diagnosis not present

## 2024-01-31 DIAGNOSIS — D631 Anemia in chronic kidney disease: Secondary | ICD-10-CM | POA: Diagnosis not present

## 2024-01-31 DIAGNOSIS — Z992 Dependence on renal dialysis: Secondary | ICD-10-CM | POA: Diagnosis not present

## 2024-01-31 DIAGNOSIS — N186 End stage renal disease: Secondary | ICD-10-CM | POA: Diagnosis not present

## 2024-01-31 DIAGNOSIS — D689 Coagulation defect, unspecified: Secondary | ICD-10-CM | POA: Diagnosis not present

## 2024-02-03 DIAGNOSIS — D689 Coagulation defect, unspecified: Secondary | ICD-10-CM | POA: Diagnosis not present

## 2024-02-03 DIAGNOSIS — E1122 Type 2 diabetes mellitus with diabetic chronic kidney disease: Secondary | ICD-10-CM | POA: Diagnosis not present

## 2024-02-03 DIAGNOSIS — D631 Anemia in chronic kidney disease: Secondary | ICD-10-CM | POA: Diagnosis not present

## 2024-02-03 DIAGNOSIS — Z992 Dependence on renal dialysis: Secondary | ICD-10-CM | POA: Diagnosis not present

## 2024-02-03 DIAGNOSIS — N186 End stage renal disease: Secondary | ICD-10-CM | POA: Diagnosis not present

## 2024-02-03 DIAGNOSIS — E875 Hyperkalemia: Secondary | ICD-10-CM | POA: Diagnosis not present

## 2024-02-03 DIAGNOSIS — N2581 Secondary hyperparathyroidism of renal origin: Secondary | ICD-10-CM | POA: Diagnosis not present

## 2024-02-05 DIAGNOSIS — E1122 Type 2 diabetes mellitus with diabetic chronic kidney disease: Secondary | ICD-10-CM | POA: Diagnosis not present

## 2024-02-05 DIAGNOSIS — D689 Coagulation defect, unspecified: Secondary | ICD-10-CM | POA: Diagnosis not present

## 2024-02-05 DIAGNOSIS — Z992 Dependence on renal dialysis: Secondary | ICD-10-CM | POA: Diagnosis not present

## 2024-02-05 DIAGNOSIS — N2581 Secondary hyperparathyroidism of renal origin: Secondary | ICD-10-CM | POA: Diagnosis not present

## 2024-02-05 DIAGNOSIS — N186 End stage renal disease: Secondary | ICD-10-CM | POA: Diagnosis not present

## 2024-02-05 DIAGNOSIS — D631 Anemia in chronic kidney disease: Secondary | ICD-10-CM | POA: Diagnosis not present

## 2024-02-05 DIAGNOSIS — E875 Hyperkalemia: Secondary | ICD-10-CM | POA: Diagnosis not present

## 2024-02-05 NOTE — Progress Notes (Unsigned)
 Patient ID: James Nicholson, male   DOB: 12-Apr-1966, 58 y.o.   MRN: [id number]  Reason for Consult: Routine Post Op   Referred by Jackie Plum, MD  Subjective:     HPI  James Nicholson is a 58 y.o. male with ESRD and history of right radial cephalic fistula.  He has some areas of aneurysmal dilation in the forearm for which he underwent a fistulogram about 3 weeks ago and to stenoses were treated with balloon angioplasty.  He continues to deny any issues with dialysis or prolonged bleeding.  He does report that they are still getting the same areas of aneurysmal dilation at every dialysis session.  Past Medical History:  Diagnosis Date   AICD (automatic cardioverter/defibrillator) present    Medtronic Visia AF MRIT VR [id number]   Anemia    Atrial fibrillation    Cardiomyopathy (HCC) 2010   Unclear Etiology: Last Echo 06/2009: EF 40-45%, severe Lateral & apical Hypokinesis (? CAD) ; Grade 2 DDysfxn. Mild conc LVH.    Cellulitis    Chronic kidney disease (CKD), stage V (HCC)    Dialysis Tue, Thurs, Sat   Confusion    Depression    no meds   Diabetes mellitus without complication (HCC)    type 2, diet controlled, no meds, pt. denies   Dyslipidemia    Enterobacter sepsis (HCC)    HLD (hyperlipidemia)    Hypertension    Pneumonia    S/P ICD (internal cardiac defibrillator) procedure 2010   VT Arrest (in [place])   Schizophrenia [facility])    no meds   Wegener's disease, pulmonary 02/05/2013   Wegener's granulomatosis    Family History  Problem Relation Age of Onset   CAD Mother    Past Surgical History:  Procedure Laterality Date   A/V FISTULAGRAM Right 03/17/2020   Procedure: A/V FISTULAGRAM;  Surgeon: Chuck Hint, MD;  Location: [facility] INVASIVE CV LAB;  Service: Cardiovascular;  Laterality: Right;   A/V FISTULAGRAM Right 01/12/2024   Procedure: A/V Fistulagram;  Surgeon: Daria Pastures, MD;  Location: [facility] INVASIVE CV LAB;  Service: Cardiovascular;  Laterality:  Right;   AV FISTULA PLACEMENT Right 02/10/2013   Procedure: ARTERIOVENOUS (AV) FISTULA CREATION;  Surgeon: Larina Earthly, MD;  Location: [facility] OR;  Service: Vascular;  Laterality: Right;  Right forearm radial/cephalic arterovenous fistula.    CARDIAC CATHETERIZATION  2010   [place]: In setting of VT arrest. Per brother's report, nonobstructive with no intervention   CARDIAC DEFIBRILLATOR PLACEMENT  2010   [place]   EYE SURGERY Right    FISTULOGRAM Right 08/09/2013   Procedure: FISTULOGRAM;  Surgeon: Chuck Hint, MD;  Location: [facility] CATH LAB;  Service: Cardiovascular;  Laterality: Right;   ICD GENERATOR CHANGEOUT N/A 12/05/2017   Procedure: ICD GENERATOR CHANGEOUT;  Surgeon: Marinus Maw, MD;  Location: [facility] INVASIVE CV LAB;  Service: Cardiovascular;  Laterality: N/A;   ICD LEAD REMOVAL N/A 01/10/2020   Procedure: ICD LEAD REMOVAL AND IMPLANTATION OF NEW LEAD IN RIGHT VENTRICLE;  Surgeon: Marinus Maw, MD;  Location: [facility] OR;  Service: Cardiovascular;  Laterality: N/A;   INSERTION OF DIALYSIS CATHETER Right 08/07/2020   Procedure: INSERTION OF DIALYSIS CATHETER;  Surgeon: Chuck Hint, MD;  Location: [facility] OR;  Service: Vascular;  Laterality: Right;   LIGATION OF ARTERIOVENOUS  FISTULA Right 05/08/2020   Procedure: PLICATION OF ARTERIOVENOUS  FISTULA RIGHT ARM;  Surgeon: Chuck Hint, MD;  Location: [facility] OR;  Service: Vascular;  Laterality: Right;  LIGATION OF COMPETING BRANCHES OF ARTERIOVENOUS FISTULA Right 08/13/2013   Procedure: LIGATION OF COMPETING BRANCHES X5 OF ARTERIOVENOUS FISTULA- RIGHT ARM;  Surgeon: Nada Libman, MD;  Location: [facility] OR;  Service: Vascular;  Laterality: Right;   REVISON OF ARTERIOVENOUS FISTULA Right 08/07/2020   Procedure: PLICATION OF ARTERIOVENOUS FISTULA RIGHT;  Surgeon: Chuck Hint, MD;  Location: [facility] OR;  Service: Vascular;  Laterality: Right;   TEE WITHOUT CARDIOVERSION N/A 01/10/2020   Procedure: TRANSESOPHAGEAL ECHOCARDIOGRAM  (TEE);  Surgeon: Marinus Maw, MD;  Location: [facility] OR;  Service: Cardiovascular;  Laterality: N/A;   UPPER EXTREMITY INTERVENTION Right 01/12/2024   Procedure: UPPER EXTREMITY INTERVENTION;  Surgeon: Daria Pastures, MD;  Location: [facility] INVASIVE CV LAB;  Service: Cardiovascular;  Laterality: Right;    Short Social History:  Social History   Tobacco Use   Smoking status: Former    Current packs/day: 0.00    Types: Cigarettes    Quit date: 10/28/2008    Years since quitting: 15.2   Smokeless tobacco: Never  Substance Use Topics   Alcohol use: No    No Known Allergies  Current Outpatient Medications  Medication Sig Dispense Refill   AURYXIA 1 GM 210 MG(Fe) tablet Take 630 mg by mouth 3 (three) times daily with meals.     LOKELMA 10 g PACK packet Take 1 packet by mouth daily.     Nutritional Supplements (FEEDING SUPPLEMENT, NEPRO CARB STEADY,) LIQD Take 237 mLs by mouth daily.     Current Facility-Administered Medications  Medication Dose Route Frequency Provider Last Rate Last Admin   sodium chloride flush (NS) 0.9 % injection 3 mL  3 mL Intravenous PRN Daria Pastures, MD        REVIEW OF SYSTEMS   All other systems were reviewed and are negative     Objective:  Objective   Vitals:   02/06/24 1415  BP: (!) 140/87  Pulse: 90  Resp: 20  Temp: 98.4 F (36.9 C)  TempSrc: Temporal  SpO2: 95%  Weight: 172 lb 6.4 oz (78.2 kg)  Height: 5\' 9"  (1.753 m)   Body mass index is 25.46 kg/m.  Physical Exam General: no acute distress Cardiac: hemodynamically stable Vascular: Palpable thrill in radiocephalic fistula, palpable radial pulse.  2 moderately sized aneurysms of the right radiocephalic fistula, skin intact, no ulceration present.      Assessment/Plan:     James Nicholson is a 58 y.o. male with ESRD on HD via right radiocephalic fistula who is presenting now about 3 weeks status post fistulogram with treatment of 2 distinct stenoses that were distal to these 2  areas of aneurysmal dilation.  Dialysis sessions have been going well without any issues.  I explained the treatment of the stenoses should decrease the risk of continued dilation of the aneurysms although we will continue with surveillance.    Discussed with the patient and his brother to please ask the dialysis center to stop accessing the larger aneurysm closer to the wrist as this will only cause continued dilation which will eventually necessitate a revision.  Plan for follow-up in 3 months with an aVF duplex.     Daria Pastures MD [facility]

## 2024-02-06 ENCOUNTER — Encounter: Payer: Self-pay | Admitting: Vascular Surgery

## 2024-02-06 ENCOUNTER — Ambulatory Visit: Admitting: Vascular Surgery

## 2024-02-06 VITALS — BP 140/87 | HR 90 | Temp 98.4°F | Resp 20 | Ht 69.0 in | Wt 172.4 lb

## 2024-02-06 DIAGNOSIS — N186 End stage renal disease: Secondary | ICD-10-CM

## 2024-02-07 DIAGNOSIS — D689 Coagulation defect, unspecified: Secondary | ICD-10-CM | POA: Diagnosis not present

## 2024-02-07 DIAGNOSIS — E875 Hyperkalemia: Secondary | ICD-10-CM | POA: Diagnosis not present

## 2024-02-07 DIAGNOSIS — N186 End stage renal disease: Secondary | ICD-10-CM | POA: Diagnosis not present

## 2024-02-07 DIAGNOSIS — E1122 Type 2 diabetes mellitus with diabetic chronic kidney disease: Secondary | ICD-10-CM | POA: Diagnosis not present

## 2024-02-07 DIAGNOSIS — D631 Anemia in chronic kidney disease: Secondary | ICD-10-CM | POA: Diagnosis not present

## 2024-02-07 DIAGNOSIS — Z992 Dependence on renal dialysis: Secondary | ICD-10-CM | POA: Diagnosis not present

## 2024-02-07 DIAGNOSIS — N2581 Secondary hyperparathyroidism of renal origin: Secondary | ICD-10-CM | POA: Diagnosis not present

## 2024-02-10 ENCOUNTER — Other Ambulatory Visit: Payer: Self-pay | Admitting: *Deleted

## 2024-02-10 DIAGNOSIS — N186 End stage renal disease: Secondary | ICD-10-CM | POA: Diagnosis not present

## 2024-02-10 DIAGNOSIS — E875 Hyperkalemia: Secondary | ICD-10-CM | POA: Diagnosis not present

## 2024-02-10 DIAGNOSIS — D689 Coagulation defect, unspecified: Secondary | ICD-10-CM | POA: Diagnosis not present

## 2024-02-10 DIAGNOSIS — Z992 Dependence on renal dialysis: Secondary | ICD-10-CM | POA: Diagnosis not present

## 2024-02-10 DIAGNOSIS — E1122 Type 2 diabetes mellitus with diabetic chronic kidney disease: Secondary | ICD-10-CM | POA: Diagnosis not present

## 2024-02-10 DIAGNOSIS — N2581 Secondary hyperparathyroidism of renal origin: Secondary | ICD-10-CM | POA: Diagnosis not present

## 2024-02-10 DIAGNOSIS — D631 Anemia in chronic kidney disease: Secondary | ICD-10-CM | POA: Diagnosis not present

## 2024-02-12 DIAGNOSIS — N186 End stage renal disease: Secondary | ICD-10-CM | POA: Diagnosis not present

## 2024-02-12 DIAGNOSIS — E1122 Type 2 diabetes mellitus with diabetic chronic kidney disease: Secondary | ICD-10-CM | POA: Diagnosis not present

## 2024-02-12 DIAGNOSIS — Z992 Dependence on renal dialysis: Secondary | ICD-10-CM | POA: Diagnosis not present

## 2024-02-12 DIAGNOSIS — D689 Coagulation defect, unspecified: Secondary | ICD-10-CM | POA: Diagnosis not present

## 2024-02-12 DIAGNOSIS — E875 Hyperkalemia: Secondary | ICD-10-CM | POA: Diagnosis not present

## 2024-02-12 DIAGNOSIS — N2581 Secondary hyperparathyroidism of renal origin: Secondary | ICD-10-CM | POA: Diagnosis not present

## 2024-02-12 DIAGNOSIS — D631 Anemia in chronic kidney disease: Secondary | ICD-10-CM | POA: Diagnosis not present

## 2024-02-14 DIAGNOSIS — N186 End stage renal disease: Secondary | ICD-10-CM | POA: Diagnosis not present

## 2024-02-14 DIAGNOSIS — D689 Coagulation defect, unspecified: Secondary | ICD-10-CM | POA: Diagnosis not present

## 2024-02-14 DIAGNOSIS — D631 Anemia in chronic kidney disease: Secondary | ICD-10-CM | POA: Diagnosis not present

## 2024-02-14 DIAGNOSIS — N2581 Secondary hyperparathyroidism of renal origin: Secondary | ICD-10-CM | POA: Diagnosis not present

## 2024-02-14 DIAGNOSIS — Z992 Dependence on renal dialysis: Secondary | ICD-10-CM | POA: Diagnosis not present

## 2024-02-14 DIAGNOSIS — E875 Hyperkalemia: Secondary | ICD-10-CM | POA: Diagnosis not present

## 2024-02-14 DIAGNOSIS — E1122 Type 2 diabetes mellitus with diabetic chronic kidney disease: Secondary | ICD-10-CM | POA: Diagnosis not present

## 2024-02-17 DIAGNOSIS — D689 Coagulation defect, unspecified: Secondary | ICD-10-CM | POA: Diagnosis not present

## 2024-02-17 DIAGNOSIS — E875 Hyperkalemia: Secondary | ICD-10-CM | POA: Diagnosis not present

## 2024-02-17 DIAGNOSIS — N2581 Secondary hyperparathyroidism of renal origin: Secondary | ICD-10-CM | POA: Diagnosis not present

## 2024-02-17 DIAGNOSIS — D631 Anemia in chronic kidney disease: Secondary | ICD-10-CM | POA: Diagnosis not present

## 2024-02-17 DIAGNOSIS — Z992 Dependence on renal dialysis: Secondary | ICD-10-CM | POA: Diagnosis not present

## 2024-02-17 DIAGNOSIS — N186 End stage renal disease: Secondary | ICD-10-CM | POA: Diagnosis not present

## 2024-02-17 DIAGNOSIS — E1122 Type 2 diabetes mellitus with diabetic chronic kidney disease: Secondary | ICD-10-CM | POA: Diagnosis not present

## 2024-02-19 DIAGNOSIS — E875 Hyperkalemia: Secondary | ICD-10-CM | POA: Diagnosis not present

## 2024-02-19 DIAGNOSIS — D631 Anemia in chronic kidney disease: Secondary | ICD-10-CM | POA: Diagnosis not present

## 2024-02-19 DIAGNOSIS — N186 End stage renal disease: Secondary | ICD-10-CM | POA: Diagnosis not present

## 2024-02-19 DIAGNOSIS — Z992 Dependence on renal dialysis: Secondary | ICD-10-CM | POA: Diagnosis not present

## 2024-02-19 DIAGNOSIS — E1122 Type 2 diabetes mellitus with diabetic chronic kidney disease: Secondary | ICD-10-CM | POA: Diagnosis not present

## 2024-02-19 DIAGNOSIS — D689 Coagulation defect, unspecified: Secondary | ICD-10-CM | POA: Diagnosis not present

## 2024-02-19 DIAGNOSIS — N2581 Secondary hyperparathyroidism of renal origin: Secondary | ICD-10-CM | POA: Diagnosis not present

## 2024-02-20 NOTE — Progress Notes (Signed)
 Remote ICD transmission.

## 2024-02-21 DIAGNOSIS — D631 Anemia in chronic kidney disease: Secondary | ICD-10-CM | POA: Diagnosis not present

## 2024-02-21 DIAGNOSIS — N186 End stage renal disease: Secondary | ICD-10-CM | POA: Diagnosis not present

## 2024-02-21 DIAGNOSIS — N2581 Secondary hyperparathyroidism of renal origin: Secondary | ICD-10-CM | POA: Diagnosis not present

## 2024-02-21 DIAGNOSIS — D689 Coagulation defect, unspecified: Secondary | ICD-10-CM | POA: Diagnosis not present

## 2024-02-21 DIAGNOSIS — E875 Hyperkalemia: Secondary | ICD-10-CM | POA: Diagnosis not present

## 2024-02-21 DIAGNOSIS — E1122 Type 2 diabetes mellitus with diabetic chronic kidney disease: Secondary | ICD-10-CM | POA: Diagnosis not present

## 2024-02-21 DIAGNOSIS — Z992 Dependence on renal dialysis: Secondary | ICD-10-CM | POA: Diagnosis not present

## 2024-02-24 DIAGNOSIS — D689 Coagulation defect, unspecified: Secondary | ICD-10-CM | POA: Diagnosis not present

## 2024-02-24 DIAGNOSIS — D631 Anemia in chronic kidney disease: Secondary | ICD-10-CM | POA: Diagnosis not present

## 2024-02-24 DIAGNOSIS — N186 End stage renal disease: Secondary | ICD-10-CM | POA: Diagnosis not present

## 2024-02-24 DIAGNOSIS — Z992 Dependence on renal dialysis: Secondary | ICD-10-CM | POA: Diagnosis not present

## 2024-02-24 DIAGNOSIS — E875 Hyperkalemia: Secondary | ICD-10-CM | POA: Diagnosis not present

## 2024-02-24 DIAGNOSIS — E1122 Type 2 diabetes mellitus with diabetic chronic kidney disease: Secondary | ICD-10-CM | POA: Diagnosis not present

## 2024-02-24 DIAGNOSIS — N2581 Secondary hyperparathyroidism of renal origin: Secondary | ICD-10-CM | POA: Diagnosis not present

## 2024-02-25 DIAGNOSIS — N032 Chronic nephritic syndrome with diffuse membranous glomerulonephritis: Secondary | ICD-10-CM | POA: Diagnosis not present

## 2024-02-25 DIAGNOSIS — Z992 Dependence on renal dialysis: Secondary | ICD-10-CM | POA: Diagnosis not present

## 2024-02-25 DIAGNOSIS — N186 End stage renal disease: Secondary | ICD-10-CM | POA: Diagnosis not present

## 2024-02-26 DIAGNOSIS — D631 Anemia in chronic kidney disease: Secondary | ICD-10-CM | POA: Diagnosis not present

## 2024-02-26 DIAGNOSIS — E1122 Type 2 diabetes mellitus with diabetic chronic kidney disease: Secondary | ICD-10-CM | POA: Diagnosis not present

## 2024-02-26 DIAGNOSIS — Z992 Dependence on renal dialysis: Secondary | ICD-10-CM | POA: Diagnosis not present

## 2024-02-26 DIAGNOSIS — N2581 Secondary hyperparathyroidism of renal origin: Secondary | ICD-10-CM | POA: Diagnosis not present

## 2024-02-26 DIAGNOSIS — N186 End stage renal disease: Secondary | ICD-10-CM | POA: Diagnosis not present

## 2024-02-26 DIAGNOSIS — D689 Coagulation defect, unspecified: Secondary | ICD-10-CM | POA: Diagnosis not present

## 2024-02-26 DIAGNOSIS — E875 Hyperkalemia: Secondary | ICD-10-CM | POA: Diagnosis not present

## 2024-02-28 DIAGNOSIS — N186 End stage renal disease: Secondary | ICD-10-CM | POA: Diagnosis not present

## 2024-02-28 DIAGNOSIS — E1122 Type 2 diabetes mellitus with diabetic chronic kidney disease: Secondary | ICD-10-CM | POA: Diagnosis not present

## 2024-02-28 DIAGNOSIS — N2581 Secondary hyperparathyroidism of renal origin: Secondary | ICD-10-CM | POA: Diagnosis not present

## 2024-02-28 DIAGNOSIS — E875 Hyperkalemia: Secondary | ICD-10-CM | POA: Diagnosis not present

## 2024-02-28 DIAGNOSIS — Z992 Dependence on renal dialysis: Secondary | ICD-10-CM | POA: Diagnosis not present

## 2024-02-28 DIAGNOSIS — D631 Anemia in chronic kidney disease: Secondary | ICD-10-CM | POA: Diagnosis not present

## 2024-02-28 DIAGNOSIS — D689 Coagulation defect, unspecified: Secondary | ICD-10-CM | POA: Diagnosis not present

## 2024-03-02 DIAGNOSIS — D689 Coagulation defect, unspecified: Secondary | ICD-10-CM | POA: Diagnosis not present

## 2024-03-02 DIAGNOSIS — N2581 Secondary hyperparathyroidism of renal origin: Secondary | ICD-10-CM | POA: Diagnosis not present

## 2024-03-02 DIAGNOSIS — Z992 Dependence on renal dialysis: Secondary | ICD-10-CM | POA: Diagnosis not present

## 2024-03-02 DIAGNOSIS — D631 Anemia in chronic kidney disease: Secondary | ICD-10-CM | POA: Diagnosis not present

## 2024-03-02 DIAGNOSIS — E1122 Type 2 diabetes mellitus with diabetic chronic kidney disease: Secondary | ICD-10-CM | POA: Diagnosis not present

## 2024-03-02 DIAGNOSIS — E875 Hyperkalemia: Secondary | ICD-10-CM | POA: Diagnosis not present

## 2024-03-02 DIAGNOSIS — N186 End stage renal disease: Secondary | ICD-10-CM | POA: Diagnosis not present

## 2024-03-04 DIAGNOSIS — E875 Hyperkalemia: Secondary | ICD-10-CM | POA: Diagnosis not present

## 2024-03-04 DIAGNOSIS — D631 Anemia in chronic kidney disease: Secondary | ICD-10-CM | POA: Diagnosis not present

## 2024-03-04 DIAGNOSIS — Z992 Dependence on renal dialysis: Secondary | ICD-10-CM | POA: Diagnosis not present

## 2024-03-04 DIAGNOSIS — N2581 Secondary hyperparathyroidism of renal origin: Secondary | ICD-10-CM | POA: Diagnosis not present

## 2024-03-04 DIAGNOSIS — D689 Coagulation defect, unspecified: Secondary | ICD-10-CM | POA: Diagnosis not present

## 2024-03-04 DIAGNOSIS — E1122 Type 2 diabetes mellitus with diabetic chronic kidney disease: Secondary | ICD-10-CM | POA: Diagnosis not present

## 2024-03-04 DIAGNOSIS — N186 End stage renal disease: Secondary | ICD-10-CM | POA: Diagnosis not present

## 2024-03-06 DIAGNOSIS — E1122 Type 2 diabetes mellitus with diabetic chronic kidney disease: Secondary | ICD-10-CM | POA: Diagnosis not present

## 2024-03-06 DIAGNOSIS — D631 Anemia in chronic kidney disease: Secondary | ICD-10-CM | POA: Diagnosis not present

## 2024-03-06 DIAGNOSIS — E875 Hyperkalemia: Secondary | ICD-10-CM | POA: Diagnosis not present

## 2024-03-06 DIAGNOSIS — D689 Coagulation defect, unspecified: Secondary | ICD-10-CM | POA: Diagnosis not present

## 2024-03-06 DIAGNOSIS — N186 End stage renal disease: Secondary | ICD-10-CM | POA: Diagnosis not present

## 2024-03-06 DIAGNOSIS — Z992 Dependence on renal dialysis: Secondary | ICD-10-CM | POA: Diagnosis not present

## 2024-03-06 DIAGNOSIS — N2581 Secondary hyperparathyroidism of renal origin: Secondary | ICD-10-CM | POA: Diagnosis not present

## 2024-03-09 DIAGNOSIS — D631 Anemia in chronic kidney disease: Secondary | ICD-10-CM | POA: Diagnosis not present

## 2024-03-09 DIAGNOSIS — N186 End stage renal disease: Secondary | ICD-10-CM | POA: Diagnosis not present

## 2024-03-09 DIAGNOSIS — Z992 Dependence on renal dialysis: Secondary | ICD-10-CM | POA: Diagnosis not present

## 2024-03-09 DIAGNOSIS — N2581 Secondary hyperparathyroidism of renal origin: Secondary | ICD-10-CM | POA: Diagnosis not present

## 2024-03-09 DIAGNOSIS — E875 Hyperkalemia: Secondary | ICD-10-CM | POA: Diagnosis not present

## 2024-03-09 DIAGNOSIS — D689 Coagulation defect, unspecified: Secondary | ICD-10-CM | POA: Diagnosis not present

## 2024-03-09 DIAGNOSIS — E1122 Type 2 diabetes mellitus with diabetic chronic kidney disease: Secondary | ICD-10-CM | POA: Diagnosis not present

## 2024-03-11 DIAGNOSIS — D631 Anemia in chronic kidney disease: Secondary | ICD-10-CM | POA: Diagnosis not present

## 2024-03-11 DIAGNOSIS — E875 Hyperkalemia: Secondary | ICD-10-CM | POA: Diagnosis not present

## 2024-03-11 DIAGNOSIS — N186 End stage renal disease: Secondary | ICD-10-CM | POA: Diagnosis not present

## 2024-03-11 DIAGNOSIS — D689 Coagulation defect, unspecified: Secondary | ICD-10-CM | POA: Diagnosis not present

## 2024-03-11 DIAGNOSIS — E1122 Type 2 diabetes mellitus with diabetic chronic kidney disease: Secondary | ICD-10-CM | POA: Diagnosis not present

## 2024-03-11 DIAGNOSIS — N2581 Secondary hyperparathyroidism of renal origin: Secondary | ICD-10-CM | POA: Diagnosis not present

## 2024-03-11 DIAGNOSIS — Z992 Dependence on renal dialysis: Secondary | ICD-10-CM | POA: Diagnosis not present

## 2024-03-13 DIAGNOSIS — E1122 Type 2 diabetes mellitus with diabetic chronic kidney disease: Secondary | ICD-10-CM | POA: Diagnosis not present

## 2024-03-13 DIAGNOSIS — Z992 Dependence on renal dialysis: Secondary | ICD-10-CM | POA: Diagnosis not present

## 2024-03-13 DIAGNOSIS — E875 Hyperkalemia: Secondary | ICD-10-CM | POA: Diagnosis not present

## 2024-03-13 DIAGNOSIS — N186 End stage renal disease: Secondary | ICD-10-CM | POA: Diagnosis not present

## 2024-03-13 DIAGNOSIS — D689 Coagulation defect, unspecified: Secondary | ICD-10-CM | POA: Diagnosis not present

## 2024-03-13 DIAGNOSIS — N2581 Secondary hyperparathyroidism of renal origin: Secondary | ICD-10-CM | POA: Diagnosis not present

## 2024-03-13 DIAGNOSIS — D631 Anemia in chronic kidney disease: Secondary | ICD-10-CM | POA: Diagnosis not present

## 2024-03-16 DIAGNOSIS — N186 End stage renal disease: Secondary | ICD-10-CM | POA: Diagnosis not present

## 2024-03-16 DIAGNOSIS — Z992 Dependence on renal dialysis: Secondary | ICD-10-CM | POA: Diagnosis not present

## 2024-03-16 DIAGNOSIS — D631 Anemia in chronic kidney disease: Secondary | ICD-10-CM | POA: Diagnosis not present

## 2024-03-16 DIAGNOSIS — E875 Hyperkalemia: Secondary | ICD-10-CM | POA: Diagnosis not present

## 2024-03-16 DIAGNOSIS — E1122 Type 2 diabetes mellitus with diabetic chronic kidney disease: Secondary | ICD-10-CM | POA: Diagnosis not present

## 2024-03-16 DIAGNOSIS — D689 Coagulation defect, unspecified: Secondary | ICD-10-CM | POA: Diagnosis not present

## 2024-03-16 DIAGNOSIS — N2581 Secondary hyperparathyroidism of renal origin: Secondary | ICD-10-CM | POA: Diagnosis not present

## 2024-03-18 DIAGNOSIS — N186 End stage renal disease: Secondary | ICD-10-CM | POA: Diagnosis not present

## 2024-03-18 DIAGNOSIS — E875 Hyperkalemia: Secondary | ICD-10-CM | POA: Diagnosis not present

## 2024-03-18 DIAGNOSIS — D631 Anemia in chronic kidney disease: Secondary | ICD-10-CM | POA: Diagnosis not present

## 2024-03-18 DIAGNOSIS — D689 Coagulation defect, unspecified: Secondary | ICD-10-CM | POA: Diagnosis not present

## 2024-03-18 DIAGNOSIS — N2581 Secondary hyperparathyroidism of renal origin: Secondary | ICD-10-CM | POA: Diagnosis not present

## 2024-03-18 DIAGNOSIS — E1122 Type 2 diabetes mellitus with diabetic chronic kidney disease: Secondary | ICD-10-CM | POA: Diagnosis not present

## 2024-03-18 DIAGNOSIS — Z992 Dependence on renal dialysis: Secondary | ICD-10-CM | POA: Diagnosis not present

## 2024-03-19 DIAGNOSIS — D689 Coagulation defect, unspecified: Secondary | ICD-10-CM | POA: Diagnosis not present

## 2024-03-19 DIAGNOSIS — Z992 Dependence on renal dialysis: Secondary | ICD-10-CM | POA: Diagnosis not present

## 2024-03-19 DIAGNOSIS — D631 Anemia in chronic kidney disease: Secondary | ICD-10-CM | POA: Diagnosis not present

## 2024-03-19 DIAGNOSIS — E875 Hyperkalemia: Secondary | ICD-10-CM | POA: Diagnosis not present

## 2024-03-19 DIAGNOSIS — E1122 Type 2 diabetes mellitus with diabetic chronic kidney disease: Secondary | ICD-10-CM | POA: Diagnosis not present

## 2024-03-19 DIAGNOSIS — N2581 Secondary hyperparathyroidism of renal origin: Secondary | ICD-10-CM | POA: Diagnosis not present

## 2024-03-19 DIAGNOSIS — N186 End stage renal disease: Secondary | ICD-10-CM | POA: Diagnosis not present

## 2024-03-20 DIAGNOSIS — N2581 Secondary hyperparathyroidism of renal origin: Secondary | ICD-10-CM | POA: Diagnosis not present

## 2024-03-20 DIAGNOSIS — E875 Hyperkalemia: Secondary | ICD-10-CM | POA: Diagnosis not present

## 2024-03-20 DIAGNOSIS — D689 Coagulation defect, unspecified: Secondary | ICD-10-CM | POA: Diagnosis not present

## 2024-03-20 DIAGNOSIS — N186 End stage renal disease: Secondary | ICD-10-CM | POA: Diagnosis not present

## 2024-03-20 DIAGNOSIS — Z992 Dependence on renal dialysis: Secondary | ICD-10-CM | POA: Diagnosis not present

## 2024-03-20 DIAGNOSIS — E1122 Type 2 diabetes mellitus with diabetic chronic kidney disease: Secondary | ICD-10-CM | POA: Diagnosis not present

## 2024-03-20 DIAGNOSIS — D631 Anemia in chronic kidney disease: Secondary | ICD-10-CM | POA: Diagnosis not present

## 2024-03-23 DIAGNOSIS — Z992 Dependence on renal dialysis: Secondary | ICD-10-CM | POA: Diagnosis not present

## 2024-03-23 DIAGNOSIS — D689 Coagulation defect, unspecified: Secondary | ICD-10-CM | POA: Diagnosis not present

## 2024-03-23 DIAGNOSIS — E875 Hyperkalemia: Secondary | ICD-10-CM | POA: Diagnosis not present

## 2024-03-23 DIAGNOSIS — N2581 Secondary hyperparathyroidism of renal origin: Secondary | ICD-10-CM | POA: Diagnosis not present

## 2024-03-23 DIAGNOSIS — E1122 Type 2 diabetes mellitus with diabetic chronic kidney disease: Secondary | ICD-10-CM | POA: Diagnosis not present

## 2024-03-23 DIAGNOSIS — D631 Anemia in chronic kidney disease: Secondary | ICD-10-CM | POA: Diagnosis not present

## 2024-03-23 DIAGNOSIS — N186 End stage renal disease: Secondary | ICD-10-CM | POA: Diagnosis not present

## 2024-03-25 DIAGNOSIS — E1122 Type 2 diabetes mellitus with diabetic chronic kidney disease: Secondary | ICD-10-CM | POA: Diagnosis not present

## 2024-03-25 DIAGNOSIS — E875 Hyperkalemia: Secondary | ICD-10-CM | POA: Diagnosis not present

## 2024-03-25 DIAGNOSIS — D689 Coagulation defect, unspecified: Secondary | ICD-10-CM | POA: Diagnosis not present

## 2024-03-25 DIAGNOSIS — N2581 Secondary hyperparathyroidism of renal origin: Secondary | ICD-10-CM | POA: Diagnosis not present

## 2024-03-25 DIAGNOSIS — Z992 Dependence on renal dialysis: Secondary | ICD-10-CM | POA: Diagnosis not present

## 2024-03-25 DIAGNOSIS — N186 End stage renal disease: Secondary | ICD-10-CM | POA: Diagnosis not present

## 2024-03-25 DIAGNOSIS — D631 Anemia in chronic kidney disease: Secondary | ICD-10-CM | POA: Diagnosis not present

## 2024-03-27 DIAGNOSIS — Z992 Dependence on renal dialysis: Secondary | ICD-10-CM | POA: Diagnosis not present

## 2024-03-27 DIAGNOSIS — E875 Hyperkalemia: Secondary | ICD-10-CM | POA: Diagnosis not present

## 2024-03-27 DIAGNOSIS — N032 Chronic nephritic syndrome with diffuse membranous glomerulonephritis: Secondary | ICD-10-CM | POA: Diagnosis not present

## 2024-03-27 DIAGNOSIS — E1122 Type 2 diabetes mellitus with diabetic chronic kidney disease: Secondary | ICD-10-CM | POA: Diagnosis not present

## 2024-03-27 DIAGNOSIS — N2581 Secondary hyperparathyroidism of renal origin: Secondary | ICD-10-CM | POA: Diagnosis not present

## 2024-03-27 DIAGNOSIS — D689 Coagulation defect, unspecified: Secondary | ICD-10-CM | POA: Diagnosis not present

## 2024-03-27 DIAGNOSIS — D631 Anemia in chronic kidney disease: Secondary | ICD-10-CM | POA: Diagnosis not present

## 2024-03-27 DIAGNOSIS — N186 End stage renal disease: Secondary | ICD-10-CM | POA: Diagnosis not present

## 2024-03-30 DIAGNOSIS — N2581 Secondary hyperparathyroidism of renal origin: Secondary | ICD-10-CM | POA: Diagnosis not present

## 2024-03-30 DIAGNOSIS — E1122 Type 2 diabetes mellitus with diabetic chronic kidney disease: Secondary | ICD-10-CM | POA: Diagnosis not present

## 2024-03-30 DIAGNOSIS — D689 Coagulation defect, unspecified: Secondary | ICD-10-CM | POA: Diagnosis not present

## 2024-03-30 DIAGNOSIS — Z992 Dependence on renal dialysis: Secondary | ICD-10-CM | POA: Diagnosis not present

## 2024-03-30 DIAGNOSIS — D631 Anemia in chronic kidney disease: Secondary | ICD-10-CM | POA: Diagnosis not present

## 2024-03-30 DIAGNOSIS — N186 End stage renal disease: Secondary | ICD-10-CM | POA: Diagnosis not present

## 2024-03-30 DIAGNOSIS — E875 Hyperkalemia: Secondary | ICD-10-CM | POA: Diagnosis not present

## 2024-04-01 DIAGNOSIS — E1122 Type 2 diabetes mellitus with diabetic chronic kidney disease: Secondary | ICD-10-CM | POA: Diagnosis not present

## 2024-04-01 DIAGNOSIS — D689 Coagulation defect, unspecified: Secondary | ICD-10-CM | POA: Diagnosis not present

## 2024-04-01 DIAGNOSIS — N186 End stage renal disease: Secondary | ICD-10-CM | POA: Diagnosis not present

## 2024-04-01 DIAGNOSIS — Z992 Dependence on renal dialysis: Secondary | ICD-10-CM | POA: Diagnosis not present

## 2024-04-01 DIAGNOSIS — E875 Hyperkalemia: Secondary | ICD-10-CM | POA: Diagnosis not present

## 2024-04-01 DIAGNOSIS — D631 Anemia in chronic kidney disease: Secondary | ICD-10-CM | POA: Diagnosis not present

## 2024-04-01 DIAGNOSIS — N2581 Secondary hyperparathyroidism of renal origin: Secondary | ICD-10-CM | POA: Diagnosis not present

## 2024-04-03 DIAGNOSIS — N2581 Secondary hyperparathyroidism of renal origin: Secondary | ICD-10-CM | POA: Diagnosis not present

## 2024-04-03 DIAGNOSIS — N186 End stage renal disease: Secondary | ICD-10-CM | POA: Diagnosis not present

## 2024-04-03 DIAGNOSIS — Z992 Dependence on renal dialysis: Secondary | ICD-10-CM | POA: Diagnosis not present

## 2024-04-03 DIAGNOSIS — E875 Hyperkalemia: Secondary | ICD-10-CM | POA: Diagnosis not present

## 2024-04-03 DIAGNOSIS — D689 Coagulation defect, unspecified: Secondary | ICD-10-CM | POA: Diagnosis not present

## 2024-04-03 DIAGNOSIS — D631 Anemia in chronic kidney disease: Secondary | ICD-10-CM | POA: Diagnosis not present

## 2024-04-03 DIAGNOSIS — E1122 Type 2 diabetes mellitus with diabetic chronic kidney disease: Secondary | ICD-10-CM | POA: Diagnosis not present

## 2024-04-06 DIAGNOSIS — N186 End stage renal disease: Secondary | ICD-10-CM | POA: Diagnosis not present

## 2024-04-06 DIAGNOSIS — E875 Hyperkalemia: Secondary | ICD-10-CM | POA: Diagnosis not present

## 2024-04-06 DIAGNOSIS — Z992 Dependence on renal dialysis: Secondary | ICD-10-CM | POA: Diagnosis not present

## 2024-04-06 DIAGNOSIS — D631 Anemia in chronic kidney disease: Secondary | ICD-10-CM | POA: Diagnosis not present

## 2024-04-06 DIAGNOSIS — N2581 Secondary hyperparathyroidism of renal origin: Secondary | ICD-10-CM | POA: Diagnosis not present

## 2024-04-06 DIAGNOSIS — D689 Coagulation defect, unspecified: Secondary | ICD-10-CM | POA: Diagnosis not present

## 2024-04-06 DIAGNOSIS — E1122 Type 2 diabetes mellitus with diabetic chronic kidney disease: Secondary | ICD-10-CM | POA: Diagnosis not present

## 2024-04-08 ENCOUNTER — Ambulatory Visit (INDEPENDENT_AMBULATORY_CARE_PROVIDER_SITE_OTHER): Payer: 59

## 2024-04-08 DIAGNOSIS — I255 Ischemic cardiomyopathy: Secondary | ICD-10-CM | POA: Diagnosis not present

## 2024-04-08 DIAGNOSIS — D689 Coagulation defect, unspecified: Secondary | ICD-10-CM | POA: Diagnosis not present

## 2024-04-08 DIAGNOSIS — D631 Anemia in chronic kidney disease: Secondary | ICD-10-CM | POA: Diagnosis not present

## 2024-04-08 DIAGNOSIS — E875 Hyperkalemia: Secondary | ICD-10-CM | POA: Diagnosis not present

## 2024-04-08 DIAGNOSIS — Z992 Dependence on renal dialysis: Secondary | ICD-10-CM | POA: Diagnosis not present

## 2024-04-08 DIAGNOSIS — N2581 Secondary hyperparathyroidism of renal origin: Secondary | ICD-10-CM | POA: Diagnosis not present

## 2024-04-08 DIAGNOSIS — N186 End stage renal disease: Secondary | ICD-10-CM | POA: Diagnosis not present

## 2024-04-08 DIAGNOSIS — E1122 Type 2 diabetes mellitus with diabetic chronic kidney disease: Secondary | ICD-10-CM | POA: Diagnosis not present

## 2024-04-08 LAB — CUP PACEART REMOTE DEVICE CHECK
Battery Remaining Longevity: 46 mo
Battery Voltage: 2.95 V
Brady Statistic RV Percent Paced: 0.01 %
Date Time Interrogation Session: 20250612043626
HighPow Impedance: 254 Ohm
HighPow Impedance: 53 Ohm
Implantable Lead Connection Status: 753985
Implantable Lead Implant Date: 20210315
Implantable Lead Location: 753860
Implantable Lead Model: 6935
Implantable Pulse Generator Implant Date: 20190208
Lead Channel Impedance Value: 304 Ohm
Lead Channel Impedance Value: 399 Ohm
Lead Channel Pacing Threshold Amplitude: 0.5 V
Lead Channel Pacing Threshold Pulse Width: 0.4 ms
Lead Channel Sensing Intrinsic Amplitude: 8.875 mV
Lead Channel Sensing Intrinsic Amplitude: 8.875 mV
Lead Channel Setting Pacing Amplitude: 2 V
Lead Channel Setting Pacing Pulse Width: 0.4 ms
Lead Channel Setting Sensing Sensitivity: 0.3 mV
Zone Setting Status: 755011

## 2024-04-10 DIAGNOSIS — E1122 Type 2 diabetes mellitus with diabetic chronic kidney disease: Secondary | ICD-10-CM | POA: Diagnosis not present

## 2024-04-10 DIAGNOSIS — N2581 Secondary hyperparathyroidism of renal origin: Secondary | ICD-10-CM | POA: Diagnosis not present

## 2024-04-10 DIAGNOSIS — D631 Anemia in chronic kidney disease: Secondary | ICD-10-CM | POA: Diagnosis not present

## 2024-04-10 DIAGNOSIS — N186 End stage renal disease: Secondary | ICD-10-CM | POA: Diagnosis not present

## 2024-04-10 DIAGNOSIS — E875 Hyperkalemia: Secondary | ICD-10-CM | POA: Diagnosis not present

## 2024-04-10 DIAGNOSIS — Z992 Dependence on renal dialysis: Secondary | ICD-10-CM | POA: Diagnosis not present

## 2024-04-10 DIAGNOSIS — D689 Coagulation defect, unspecified: Secondary | ICD-10-CM | POA: Diagnosis not present

## 2024-04-11 ENCOUNTER — Ambulatory Visit: Payer: Self-pay | Admitting: Internal Medicine

## 2024-04-13 DIAGNOSIS — D631 Anemia in chronic kidney disease: Secondary | ICD-10-CM | POA: Diagnosis not present

## 2024-04-13 DIAGNOSIS — E875 Hyperkalemia: Secondary | ICD-10-CM | POA: Diagnosis not present

## 2024-04-13 DIAGNOSIS — E1122 Type 2 diabetes mellitus with diabetic chronic kidney disease: Secondary | ICD-10-CM | POA: Diagnosis not present

## 2024-04-13 DIAGNOSIS — N186 End stage renal disease: Secondary | ICD-10-CM | POA: Diagnosis not present

## 2024-04-13 DIAGNOSIS — N2581 Secondary hyperparathyroidism of renal origin: Secondary | ICD-10-CM | POA: Diagnosis not present

## 2024-04-13 DIAGNOSIS — D689 Coagulation defect, unspecified: Secondary | ICD-10-CM | POA: Diagnosis not present

## 2024-04-13 DIAGNOSIS — Z992 Dependence on renal dialysis: Secondary | ICD-10-CM | POA: Diagnosis not present

## 2024-04-15 DIAGNOSIS — N186 End stage renal disease: Secondary | ICD-10-CM | POA: Diagnosis not present

## 2024-04-15 DIAGNOSIS — Z992 Dependence on renal dialysis: Secondary | ICD-10-CM | POA: Diagnosis not present

## 2024-04-15 DIAGNOSIS — D631 Anemia in chronic kidney disease: Secondary | ICD-10-CM | POA: Diagnosis not present

## 2024-04-15 DIAGNOSIS — E1122 Type 2 diabetes mellitus with diabetic chronic kidney disease: Secondary | ICD-10-CM | POA: Diagnosis not present

## 2024-04-15 DIAGNOSIS — N2581 Secondary hyperparathyroidism of renal origin: Secondary | ICD-10-CM | POA: Diagnosis not present

## 2024-04-15 DIAGNOSIS — D689 Coagulation defect, unspecified: Secondary | ICD-10-CM | POA: Diagnosis not present

## 2024-04-15 DIAGNOSIS — E875 Hyperkalemia: Secondary | ICD-10-CM | POA: Diagnosis not present

## 2024-04-17 DIAGNOSIS — D689 Coagulation defect, unspecified: Secondary | ICD-10-CM | POA: Diagnosis not present

## 2024-04-17 DIAGNOSIS — Z992 Dependence on renal dialysis: Secondary | ICD-10-CM | POA: Diagnosis not present

## 2024-04-17 DIAGNOSIS — N186 End stage renal disease: Secondary | ICD-10-CM | POA: Diagnosis not present

## 2024-04-17 DIAGNOSIS — N2581 Secondary hyperparathyroidism of renal origin: Secondary | ICD-10-CM | POA: Diagnosis not present

## 2024-04-17 DIAGNOSIS — E875 Hyperkalemia: Secondary | ICD-10-CM | POA: Diagnosis not present

## 2024-04-17 DIAGNOSIS — E1122 Type 2 diabetes mellitus with diabetic chronic kidney disease: Secondary | ICD-10-CM | POA: Diagnosis not present

## 2024-04-17 DIAGNOSIS — D631 Anemia in chronic kidney disease: Secondary | ICD-10-CM | POA: Diagnosis not present

## 2024-04-20 DIAGNOSIS — E1122 Type 2 diabetes mellitus with diabetic chronic kidney disease: Secondary | ICD-10-CM | POA: Diagnosis not present

## 2024-04-20 DIAGNOSIS — N2581 Secondary hyperparathyroidism of renal origin: Secondary | ICD-10-CM | POA: Diagnosis not present

## 2024-04-20 DIAGNOSIS — N186 End stage renal disease: Secondary | ICD-10-CM | POA: Diagnosis not present

## 2024-04-20 DIAGNOSIS — E875 Hyperkalemia: Secondary | ICD-10-CM | POA: Diagnosis not present

## 2024-04-20 DIAGNOSIS — D631 Anemia in chronic kidney disease: Secondary | ICD-10-CM | POA: Diagnosis not present

## 2024-04-20 DIAGNOSIS — Z992 Dependence on renal dialysis: Secondary | ICD-10-CM | POA: Diagnosis not present

## 2024-04-20 DIAGNOSIS — D689 Coagulation defect, unspecified: Secondary | ICD-10-CM | POA: Diagnosis not present

## 2024-04-22 DIAGNOSIS — E875 Hyperkalemia: Secondary | ICD-10-CM | POA: Diagnosis not present

## 2024-04-22 DIAGNOSIS — N2581 Secondary hyperparathyroidism of renal origin: Secondary | ICD-10-CM | POA: Diagnosis not present

## 2024-04-22 DIAGNOSIS — D689 Coagulation defect, unspecified: Secondary | ICD-10-CM | POA: Diagnosis not present

## 2024-04-22 DIAGNOSIS — Z992 Dependence on renal dialysis: Secondary | ICD-10-CM | POA: Diagnosis not present

## 2024-04-22 DIAGNOSIS — N186 End stage renal disease: Secondary | ICD-10-CM | POA: Diagnosis not present

## 2024-04-22 DIAGNOSIS — E1122 Type 2 diabetes mellitus with diabetic chronic kidney disease: Secondary | ICD-10-CM | POA: Diagnosis not present

## 2024-04-22 DIAGNOSIS — D631 Anemia in chronic kidney disease: Secondary | ICD-10-CM | POA: Diagnosis not present

## 2024-04-24 DIAGNOSIS — Z992 Dependence on renal dialysis: Secondary | ICD-10-CM | POA: Diagnosis not present

## 2024-04-24 DIAGNOSIS — D631 Anemia in chronic kidney disease: Secondary | ICD-10-CM | POA: Diagnosis not present

## 2024-04-24 DIAGNOSIS — N2581 Secondary hyperparathyroidism of renal origin: Secondary | ICD-10-CM | POA: Diagnosis not present

## 2024-04-24 DIAGNOSIS — E1122 Type 2 diabetes mellitus with diabetic chronic kidney disease: Secondary | ICD-10-CM | POA: Diagnosis not present

## 2024-04-24 DIAGNOSIS — E875 Hyperkalemia: Secondary | ICD-10-CM | POA: Diagnosis not present

## 2024-04-24 DIAGNOSIS — D689 Coagulation defect, unspecified: Secondary | ICD-10-CM | POA: Diagnosis not present

## 2024-04-24 DIAGNOSIS — N186 End stage renal disease: Secondary | ICD-10-CM | POA: Diagnosis not present

## 2024-04-26 DIAGNOSIS — N032 Chronic nephritic syndrome with diffuse membranous glomerulonephritis: Secondary | ICD-10-CM | POA: Diagnosis not present

## 2024-04-26 DIAGNOSIS — N186 End stage renal disease: Secondary | ICD-10-CM | POA: Diagnosis not present

## 2024-04-26 DIAGNOSIS — Z992 Dependence on renal dialysis: Secondary | ICD-10-CM | POA: Diagnosis not present

## 2024-04-27 DIAGNOSIS — E1122 Type 2 diabetes mellitus with diabetic chronic kidney disease: Secondary | ICD-10-CM | POA: Diagnosis not present

## 2024-04-27 DIAGNOSIS — Z992 Dependence on renal dialysis: Secondary | ICD-10-CM | POA: Diagnosis not present

## 2024-04-27 DIAGNOSIS — D631 Anemia in chronic kidney disease: Secondary | ICD-10-CM | POA: Diagnosis not present

## 2024-04-27 DIAGNOSIS — D689 Coagulation defect, unspecified: Secondary | ICD-10-CM | POA: Diagnosis not present

## 2024-04-27 DIAGNOSIS — N2581 Secondary hyperparathyroidism of renal origin: Secondary | ICD-10-CM | POA: Diagnosis not present

## 2024-04-27 DIAGNOSIS — E875 Hyperkalemia: Secondary | ICD-10-CM | POA: Diagnosis not present

## 2024-04-27 DIAGNOSIS — N186 End stage renal disease: Secondary | ICD-10-CM | POA: Diagnosis not present

## 2024-04-29 DIAGNOSIS — D689 Coagulation defect, unspecified: Secondary | ICD-10-CM | POA: Diagnosis not present

## 2024-04-29 DIAGNOSIS — E1122 Type 2 diabetes mellitus with diabetic chronic kidney disease: Secondary | ICD-10-CM | POA: Diagnosis not present

## 2024-04-29 DIAGNOSIS — E875 Hyperkalemia: Secondary | ICD-10-CM | POA: Diagnosis not present

## 2024-04-29 DIAGNOSIS — N186 End stage renal disease: Secondary | ICD-10-CM | POA: Diagnosis not present

## 2024-04-29 DIAGNOSIS — D631 Anemia in chronic kidney disease: Secondary | ICD-10-CM | POA: Diagnosis not present

## 2024-04-29 DIAGNOSIS — N2581 Secondary hyperparathyroidism of renal origin: Secondary | ICD-10-CM | POA: Diagnosis not present

## 2024-04-29 DIAGNOSIS — Z992 Dependence on renal dialysis: Secondary | ICD-10-CM | POA: Diagnosis not present

## 2024-05-01 DIAGNOSIS — D689 Coagulation defect, unspecified: Secondary | ICD-10-CM | POA: Diagnosis not present

## 2024-05-01 DIAGNOSIS — N2581 Secondary hyperparathyroidism of renal origin: Secondary | ICD-10-CM | POA: Diagnosis not present

## 2024-05-01 DIAGNOSIS — N186 End stage renal disease: Secondary | ICD-10-CM | POA: Diagnosis not present

## 2024-05-01 DIAGNOSIS — E875 Hyperkalemia: Secondary | ICD-10-CM | POA: Diagnosis not present

## 2024-05-01 DIAGNOSIS — E1122 Type 2 diabetes mellitus with diabetic chronic kidney disease: Secondary | ICD-10-CM | POA: Diagnosis not present

## 2024-05-01 DIAGNOSIS — D631 Anemia in chronic kidney disease: Secondary | ICD-10-CM | POA: Diagnosis not present

## 2024-05-01 DIAGNOSIS — Z992 Dependence on renal dialysis: Secondary | ICD-10-CM | POA: Diagnosis not present

## 2024-05-04 DIAGNOSIS — Z992 Dependence on renal dialysis: Secondary | ICD-10-CM | POA: Diagnosis not present

## 2024-05-04 DIAGNOSIS — N2581 Secondary hyperparathyroidism of renal origin: Secondary | ICD-10-CM | POA: Diagnosis not present

## 2024-05-04 DIAGNOSIS — E1122 Type 2 diabetes mellitus with diabetic chronic kidney disease: Secondary | ICD-10-CM | POA: Diagnosis not present

## 2024-05-04 DIAGNOSIS — D631 Anemia in chronic kidney disease: Secondary | ICD-10-CM | POA: Diagnosis not present

## 2024-05-04 DIAGNOSIS — N186 End stage renal disease: Secondary | ICD-10-CM | POA: Diagnosis not present

## 2024-05-04 DIAGNOSIS — E875 Hyperkalemia: Secondary | ICD-10-CM | POA: Diagnosis not present

## 2024-05-04 DIAGNOSIS — D689 Coagulation defect, unspecified: Secondary | ICD-10-CM | POA: Diagnosis not present

## 2024-05-05 DIAGNOSIS — I7782 Antineutrophilic cytoplasmic antibody (ANCA) vasculitis: Secondary | ICD-10-CM | POA: Diagnosis not present

## 2024-05-05 DIAGNOSIS — M3131 Wegener's granulomatosis with renal involvement: Secondary | ICD-10-CM | POA: Diagnosis not present

## 2024-05-05 DIAGNOSIS — H0589 Other disorders of orbit: Secondary | ICD-10-CM | POA: Diagnosis not present

## 2024-05-06 DIAGNOSIS — D631 Anemia in chronic kidney disease: Secondary | ICD-10-CM | POA: Diagnosis not present

## 2024-05-06 DIAGNOSIS — N2581 Secondary hyperparathyroidism of renal origin: Secondary | ICD-10-CM | POA: Diagnosis not present

## 2024-05-06 DIAGNOSIS — E1122 Type 2 diabetes mellitus with diabetic chronic kidney disease: Secondary | ICD-10-CM | POA: Diagnosis not present

## 2024-05-06 DIAGNOSIS — Z992 Dependence on renal dialysis: Secondary | ICD-10-CM | POA: Diagnosis not present

## 2024-05-06 DIAGNOSIS — N186 End stage renal disease: Secondary | ICD-10-CM | POA: Diagnosis not present

## 2024-05-06 DIAGNOSIS — E875 Hyperkalemia: Secondary | ICD-10-CM | POA: Diagnosis not present

## 2024-05-06 DIAGNOSIS — D689 Coagulation defect, unspecified: Secondary | ICD-10-CM | POA: Diagnosis not present

## 2024-05-08 DIAGNOSIS — D689 Coagulation defect, unspecified: Secondary | ICD-10-CM | POA: Diagnosis not present

## 2024-05-08 DIAGNOSIS — E1122 Type 2 diabetes mellitus with diabetic chronic kidney disease: Secondary | ICD-10-CM | POA: Diagnosis not present

## 2024-05-08 DIAGNOSIS — N2581 Secondary hyperparathyroidism of renal origin: Secondary | ICD-10-CM | POA: Diagnosis not present

## 2024-05-08 DIAGNOSIS — D631 Anemia in chronic kidney disease: Secondary | ICD-10-CM | POA: Diagnosis not present

## 2024-05-08 DIAGNOSIS — N186 End stage renal disease: Secondary | ICD-10-CM | POA: Diagnosis not present

## 2024-05-08 DIAGNOSIS — E875 Hyperkalemia: Secondary | ICD-10-CM | POA: Diagnosis not present

## 2024-05-08 DIAGNOSIS — Z992 Dependence on renal dialysis: Secondary | ICD-10-CM | POA: Diagnosis not present

## 2024-05-11 DIAGNOSIS — N186 End stage renal disease: Secondary | ICD-10-CM | POA: Diagnosis not present

## 2024-05-11 DIAGNOSIS — N2581 Secondary hyperparathyroidism of renal origin: Secondary | ICD-10-CM | POA: Diagnosis not present

## 2024-05-11 DIAGNOSIS — E875 Hyperkalemia: Secondary | ICD-10-CM | POA: Diagnosis not present

## 2024-05-11 DIAGNOSIS — E1122 Type 2 diabetes mellitus with diabetic chronic kidney disease: Secondary | ICD-10-CM | POA: Diagnosis not present

## 2024-05-11 DIAGNOSIS — D631 Anemia in chronic kidney disease: Secondary | ICD-10-CM | POA: Diagnosis not present

## 2024-05-11 DIAGNOSIS — D689 Coagulation defect, unspecified: Secondary | ICD-10-CM | POA: Diagnosis not present

## 2024-05-11 DIAGNOSIS — Z992 Dependence on renal dialysis: Secondary | ICD-10-CM | POA: Diagnosis not present

## 2024-05-13 DIAGNOSIS — D689 Coagulation defect, unspecified: Secondary | ICD-10-CM | POA: Diagnosis not present

## 2024-05-13 DIAGNOSIS — D631 Anemia in chronic kidney disease: Secondary | ICD-10-CM | POA: Diagnosis not present

## 2024-05-13 DIAGNOSIS — N186 End stage renal disease: Secondary | ICD-10-CM | POA: Diagnosis not present

## 2024-05-13 DIAGNOSIS — E1122 Type 2 diabetes mellitus with diabetic chronic kidney disease: Secondary | ICD-10-CM | POA: Diagnosis not present

## 2024-05-13 DIAGNOSIS — Z992 Dependence on renal dialysis: Secondary | ICD-10-CM | POA: Diagnosis not present

## 2024-05-13 DIAGNOSIS — E875 Hyperkalemia: Secondary | ICD-10-CM | POA: Diagnosis not present

## 2024-05-13 DIAGNOSIS — N2581 Secondary hyperparathyroidism of renal origin: Secondary | ICD-10-CM | POA: Diagnosis not present

## 2024-05-14 ENCOUNTER — Ambulatory Visit: Admitting: Vascular Surgery

## 2024-05-14 ENCOUNTER — Encounter (HOSPITAL_COMMUNITY)

## 2024-05-15 DIAGNOSIS — E1122 Type 2 diabetes mellitus with diabetic chronic kidney disease: Secondary | ICD-10-CM | POA: Diagnosis not present

## 2024-05-15 DIAGNOSIS — D689 Coagulation defect, unspecified: Secondary | ICD-10-CM | POA: Diagnosis not present

## 2024-05-15 DIAGNOSIS — N186 End stage renal disease: Secondary | ICD-10-CM | POA: Diagnosis not present

## 2024-05-15 DIAGNOSIS — N2581 Secondary hyperparathyroidism of renal origin: Secondary | ICD-10-CM | POA: Diagnosis not present

## 2024-05-15 DIAGNOSIS — Z992 Dependence on renal dialysis: Secondary | ICD-10-CM | POA: Diagnosis not present

## 2024-05-15 DIAGNOSIS — E875 Hyperkalemia: Secondary | ICD-10-CM | POA: Diagnosis not present

## 2024-05-15 DIAGNOSIS — D631 Anemia in chronic kidney disease: Secondary | ICD-10-CM | POA: Diagnosis not present

## 2024-05-18 DIAGNOSIS — D689 Coagulation defect, unspecified: Secondary | ICD-10-CM | POA: Diagnosis not present

## 2024-05-18 DIAGNOSIS — N2581 Secondary hyperparathyroidism of renal origin: Secondary | ICD-10-CM | POA: Diagnosis not present

## 2024-05-18 DIAGNOSIS — N186 End stage renal disease: Secondary | ICD-10-CM | POA: Diagnosis not present

## 2024-05-18 DIAGNOSIS — E1122 Type 2 diabetes mellitus with diabetic chronic kidney disease: Secondary | ICD-10-CM | POA: Diagnosis not present

## 2024-05-18 DIAGNOSIS — D631 Anemia in chronic kidney disease: Secondary | ICD-10-CM | POA: Diagnosis not present

## 2024-05-18 DIAGNOSIS — E875 Hyperkalemia: Secondary | ICD-10-CM | POA: Diagnosis not present

## 2024-05-18 DIAGNOSIS — Z992 Dependence on renal dialysis: Secondary | ICD-10-CM | POA: Diagnosis not present

## 2024-05-20 DIAGNOSIS — Z992 Dependence on renal dialysis: Secondary | ICD-10-CM | POA: Diagnosis not present

## 2024-05-20 DIAGNOSIS — N2581 Secondary hyperparathyroidism of renal origin: Secondary | ICD-10-CM | POA: Diagnosis not present

## 2024-05-20 DIAGNOSIS — E1122 Type 2 diabetes mellitus with diabetic chronic kidney disease: Secondary | ICD-10-CM | POA: Diagnosis not present

## 2024-05-20 DIAGNOSIS — N186 End stage renal disease: Secondary | ICD-10-CM | POA: Diagnosis not present

## 2024-05-20 DIAGNOSIS — D631 Anemia in chronic kidney disease: Secondary | ICD-10-CM | POA: Diagnosis not present

## 2024-05-20 DIAGNOSIS — E875 Hyperkalemia: Secondary | ICD-10-CM | POA: Diagnosis not present

## 2024-05-20 DIAGNOSIS — D689 Coagulation defect, unspecified: Secondary | ICD-10-CM | POA: Diagnosis not present

## 2024-05-22 DIAGNOSIS — E1122 Type 2 diabetes mellitus with diabetic chronic kidney disease: Secondary | ICD-10-CM | POA: Diagnosis not present

## 2024-05-22 DIAGNOSIS — N2581 Secondary hyperparathyroidism of renal origin: Secondary | ICD-10-CM | POA: Diagnosis not present

## 2024-05-22 DIAGNOSIS — D689 Coagulation defect, unspecified: Secondary | ICD-10-CM | POA: Diagnosis not present

## 2024-05-22 DIAGNOSIS — N186 End stage renal disease: Secondary | ICD-10-CM | POA: Diagnosis not present

## 2024-05-22 DIAGNOSIS — D631 Anemia in chronic kidney disease: Secondary | ICD-10-CM | POA: Diagnosis not present

## 2024-05-22 DIAGNOSIS — Z992 Dependence on renal dialysis: Secondary | ICD-10-CM | POA: Diagnosis not present

## 2024-05-22 DIAGNOSIS — E875 Hyperkalemia: Secondary | ICD-10-CM | POA: Diagnosis not present

## 2024-05-25 DIAGNOSIS — E1122 Type 2 diabetes mellitus with diabetic chronic kidney disease: Secondary | ICD-10-CM | POA: Diagnosis not present

## 2024-05-25 DIAGNOSIS — N2581 Secondary hyperparathyroidism of renal origin: Secondary | ICD-10-CM | POA: Diagnosis not present

## 2024-05-25 DIAGNOSIS — E875 Hyperkalemia: Secondary | ICD-10-CM | POA: Diagnosis not present

## 2024-05-25 DIAGNOSIS — D631 Anemia in chronic kidney disease: Secondary | ICD-10-CM | POA: Diagnosis not present

## 2024-05-25 DIAGNOSIS — Z992 Dependence on renal dialysis: Secondary | ICD-10-CM | POA: Diagnosis not present

## 2024-05-25 DIAGNOSIS — D689 Coagulation defect, unspecified: Secondary | ICD-10-CM | POA: Diagnosis not present

## 2024-05-25 DIAGNOSIS — N186 End stage renal disease: Secondary | ICD-10-CM | POA: Diagnosis not present

## 2024-05-27 DIAGNOSIS — D689 Coagulation defect, unspecified: Secondary | ICD-10-CM | POA: Diagnosis not present

## 2024-05-27 DIAGNOSIS — E1122 Type 2 diabetes mellitus with diabetic chronic kidney disease: Secondary | ICD-10-CM | POA: Diagnosis not present

## 2024-05-27 DIAGNOSIS — N186 End stage renal disease: Secondary | ICD-10-CM | POA: Diagnosis not present

## 2024-05-27 DIAGNOSIS — E875 Hyperkalemia: Secondary | ICD-10-CM | POA: Diagnosis not present

## 2024-05-27 DIAGNOSIS — N032 Chronic nephritic syndrome with diffuse membranous glomerulonephritis: Secondary | ICD-10-CM | POA: Diagnosis not present

## 2024-05-27 DIAGNOSIS — Z992 Dependence on renal dialysis: Secondary | ICD-10-CM | POA: Diagnosis not present

## 2024-05-27 DIAGNOSIS — D631 Anemia in chronic kidney disease: Secondary | ICD-10-CM | POA: Diagnosis not present

## 2024-05-27 DIAGNOSIS — N2581 Secondary hyperparathyroidism of renal origin: Secondary | ICD-10-CM | POA: Diagnosis not present

## 2024-05-29 DIAGNOSIS — N186 End stage renal disease: Secondary | ICD-10-CM | POA: Diagnosis not present

## 2024-05-29 DIAGNOSIS — N2581 Secondary hyperparathyroidism of renal origin: Secondary | ICD-10-CM | POA: Diagnosis not present

## 2024-05-29 DIAGNOSIS — E1122 Type 2 diabetes mellitus with diabetic chronic kidney disease: Secondary | ICD-10-CM | POA: Diagnosis not present

## 2024-05-29 DIAGNOSIS — D631 Anemia in chronic kidney disease: Secondary | ICD-10-CM | POA: Diagnosis not present

## 2024-05-29 DIAGNOSIS — Z992 Dependence on renal dialysis: Secondary | ICD-10-CM | POA: Diagnosis not present

## 2024-05-29 DIAGNOSIS — E875 Hyperkalemia: Secondary | ICD-10-CM | POA: Diagnosis not present

## 2024-05-29 DIAGNOSIS — D689 Coagulation defect, unspecified: Secondary | ICD-10-CM | POA: Diagnosis not present

## 2024-06-01 DIAGNOSIS — N2581 Secondary hyperparathyroidism of renal origin: Secondary | ICD-10-CM | POA: Diagnosis not present

## 2024-06-01 DIAGNOSIS — D631 Anemia in chronic kidney disease: Secondary | ICD-10-CM | POA: Diagnosis not present

## 2024-06-01 DIAGNOSIS — Z992 Dependence on renal dialysis: Secondary | ICD-10-CM | POA: Diagnosis not present

## 2024-06-01 DIAGNOSIS — N186 End stage renal disease: Secondary | ICD-10-CM | POA: Diagnosis not present

## 2024-06-01 DIAGNOSIS — E875 Hyperkalemia: Secondary | ICD-10-CM | POA: Diagnosis not present

## 2024-06-01 DIAGNOSIS — E1122 Type 2 diabetes mellitus with diabetic chronic kidney disease: Secondary | ICD-10-CM | POA: Diagnosis not present

## 2024-06-01 DIAGNOSIS — D689 Coagulation defect, unspecified: Secondary | ICD-10-CM | POA: Diagnosis not present

## 2024-06-02 DIAGNOSIS — Z79899 Other long term (current) drug therapy: Secondary | ICD-10-CM | POA: Diagnosis not present

## 2024-06-02 DIAGNOSIS — L409 Psoriasis, unspecified: Secondary | ICD-10-CM | POA: Diagnosis not present

## 2024-06-02 DIAGNOSIS — I7782 Antineutrophilic cytoplasmic antibody (ANCA) vasculitis: Secondary | ICD-10-CM | POA: Diagnosis not present

## 2024-06-02 DIAGNOSIS — Z79620 Long term (current) use of immunosuppressive biologic: Secondary | ICD-10-CM | POA: Diagnosis not present

## 2024-06-02 NOTE — H&P (View-Only) (Signed)
 Patient ID: James Nicholson, male   DOB: 1966/03/06, 58 y.o.   MRN: [id number]  Reason for Consult: Follow-up   Referred by Catalina Bare, MD  Subjective:     HPI  James Nicholson is a 58 y.o. male with ESRD and history of right radial cephalic fistula.  He has some areas of aneurysmal dilation in the forearm for which he underwent a fistulogram in march 2025 and two stenoses were treated with balloon angioplasty.  He has had a growth of the aneurysm with some associated erythema and prolonged bleeding.  Past Medical History:  Diagnosis Date   AICD (automatic cardioverter/defibrillator) present    Medtronic Visia AF MRIT VR [id number]   Anemia    Atrial fibrillation    Cardiomyopathy (HCC) 2010   Unclear Etiology: Last Echo 06/2009: EF 40-45%, severe Lateral & apical Hypokinesis (? CAD) ; Grade 2 DDysfxn. Mild conc LVH.    Cellulitis    Chronic kidney disease (CKD), stage V (HCC)    Dialysis Tue, Thurs, Sat   Confusion    Depression    no meds   Diabetes mellitus without complication (HCC)    type 2, diet controlled, no meds, pt. denies   Dyslipidemia    Enterobacter sepsis (HCC)    HLD (hyperlipidemia)    Hypertension    Pneumonia    S/P ICD (internal cardiac defibrillator) procedure 2010   VT Arrest (in [place] )   Schizophrenia (HCC)    no meds   Wegener's disease, pulmonary 02/05/2013   Wegener's granulomatosis    Family History  Problem Relation Age of Onset   CAD Mother    Past Surgical History:  Procedure Laterality Date   A/V FISTULAGRAM Right 03/17/2020   Procedure: A/V FISTULAGRAM;  Surgeon: Eliza Lonni RAMAN, MD;  Location: [facility] INVASIVE CV LAB;  Service: Cardiovascular;  Laterality: Right;   A/V FISTULAGRAM Right 01/12/2024   Procedure: A/V Fistulagram;  Surgeon: Pearline Norman RAMAN, MD;  Location: [facility] INVASIVE CV LAB;  Service: Cardiovascular;  Laterality: Right;   AV FISTULA PLACEMENT Right 02/10/2013   Procedure: ARTERIOVENOUS (AV) FISTULA CREATION;   Surgeon: Krystal JULIANNA Doing, MD;  Location: [facility] OR;  Service: Vascular;  Laterality: Right;  Right forearm radial/cephalic arterovenous fistula.    CARDIAC CATHETERIZATION  2010   [place] : In setting of VT arrest. Per brother's report, nonobstructive with no intervention   CARDIAC DEFIBRILLATOR PLACEMENT  2010   [place]    EYE SURGERY Right    FISTULOGRAM Right 08/09/2013   Procedure: FISTULOGRAM;  Surgeon: Lonni RAMAN Eliza, MD;  Location: [facility] CATH LAB;  Service: Cardiovascular;  Laterality: Right;   ICD GENERATOR CHANGEOUT N/A 12/05/2017   Procedure: ICD GENERATOR CHANGEOUT;  Surgeon: Waddell Danelle ORN, MD;  Location: [facility] INVASIVE CV LAB;  Service: Cardiovascular;  Laterality: N/A;   ICD LEAD REMOVAL N/A 01/10/2020   Procedure: ICD LEAD REMOVAL AND IMPLANTATION OF NEW LEAD IN RIGHT VENTRICLE;  Surgeon: Waddell Danelle ORN, MD;  Location: [facility] OR;  Service: Cardiovascular;  Laterality: N/A;   INSERTION OF DIALYSIS CATHETER Right 08/07/2020   Procedure: INSERTION OF DIALYSIS CATHETER;  Surgeon: Eliza Lonni RAMAN, MD;  Location: [facility] OR;  Service: Vascular;  Laterality: Right;   LIGATION OF ARTERIOVENOUS  FISTULA Right 05/08/2020   Procedure: PLICATION OF ARTERIOVENOUS  FISTULA RIGHT ARM;  Surgeon: Eliza Lonni RAMAN, MD;  Location: [facility] OR;  Service: Vascular;  Laterality: Right;   LIGATION OF COMPETING BRANCHES OF ARTERIOVENOUS FISTULA Right 08/13/2013   Procedure: LIGATION OF COMPETING BRANCHES X5  OF ARTERIOVENOUS FISTULA- RIGHT ARM;  Surgeon: Gaile LELON New, MD;  Location: [facility] OR;  Service: Vascular;  Laterality: Right;   REVISON OF ARTERIOVENOUS FISTULA Right 08/07/2020   Procedure: PLICATION OF ARTERIOVENOUS FISTULA RIGHT;  Surgeon: Eliza Lonni RAMAN, MD;  Location: [facility] OR;  Service: Vascular;  Laterality: Right;   TEE WITHOUT CARDIOVERSION N/A 01/10/2020   Procedure: TRANSESOPHAGEAL ECHOCARDIOGRAM (TEE);  Surgeon: Waddell Danelle LELON, MD;  Location: [facility] OR;  Service: Cardiovascular;  Laterality: N/A;    UPPER EXTREMITY INTERVENTION Right 01/12/2024   Procedure: UPPER EXTREMITY INTERVENTION;  Surgeon: Pearline Norman RAMAN, MD;  Location: [facility] INVASIVE CV LAB;  Service: Cardiovascular;  Laterality: Right;    Short Social History:  Social History   Tobacco Use   Smoking status: Former    Current packs/day: 0.00    Types: Cigarettes    Quit date: 10/28/2008    Years since quitting: 15.6   Smokeless tobacco: Never  Substance Use Topics   Alcohol use: No    No Known Allergies  Current Outpatient Medications  Medication Sig Dispense Refill   AURYXIA  1 GM 210 MG(Fe) tablet Take 630 mg by mouth 3 (three) times daily with meals.     LOKELMA  10 g PACK packet Take 1 packet by mouth daily.     Nutritional Supplements (FEEDING SUPPLEMENT, NEPRO CARB STEADY,) LIQD Take 237 mLs by mouth daily.     Current Facility-Administered Medications  Medication Dose Route Frequency Provider Last Rate Last Admin   sodium chloride  flush (NS) 0.9 % injection 3 mL  3 mL Intravenous PRN Pearline Norman RAMAN, MD        REVIEW OF SYSTEMS   All other systems were reviewed and are negative     Objective:  Objective   Vitals:   06/04/24 0831  BP: 121/80  Pulse: 83  Resp: 16  Temp: 98 F (36.7 C)  TempSrc: Temporal  SpO2: 96%  Weight: 168 lb 12.8 oz (76.6 kg)  Height: 5' 9 (1.753 m)    Body mass index is 24.93 kg/m.  Physical Exam General: no acute distress Cardiac: hemodynamically stable Vascular: Palpable thrill in radiocephalic fistula, palpable radial pulse.  2 moderately sized aneurysms of the right radiocephalic fistula, skin intact but thinned and erythematous.  Data AVF duplex +--------------------+----------+-----------------+--------+  AVF                PSV (cm/s)Flow Vol (mL/min)Comments  +--------------------+----------+-----------------+--------+  Native artery inflow   149           774                 +--------------------+----------+-----------------+--------+  AVF  Anastomosis        131                               +--------------------+----------+-----------------+--------+     +------------+---------+------------+---------+----------------------------  ----+  OUTFLOW VEIN   PSV     Diameter    Depth              Describe                            (cm/s)      (cm)      (cm)                                      +------------+---------+------------+---------+----------------------------  ----+  Prox Forearm   95        1.05      0.61                                      +------------+---------+------------+---------+----------------------------  ----+  Mid Forearm    496       1.66      0.29       partially-occlusive and                                                             aneurysmal              +------------+---------+------------+---------+----------------------------  ----+  Dist Forearm   119       1.93      0.36              aneurysmal              +------------+---------+------------+---------+----------------------------  ----+       Assessment/Plan:     James Nicholson is a 58 y.o. male with ESRD on HD via right radiocephalic fistula who is presenting for follow up . In March 2025 he underwent fistulogram with treatment of 2 distinct stenoses that were distal to 2 areas of aneurysmal dilation.  Dialysis sessions have been going okay but the aneurysms have grown, he is having some slightly prolonged bleeding and the skin is somewhat thinned and erythematous.  I explained that the duplex demonstrated restenosis of the area with velocities nearing 500 and at this point likely just needs revision given the increase size of the aneurysms and skin thinning. He dialyzes on Tuesday Thursday Saturday.  I explained that he will be scheduled for revision with tunneled dialysis catheter placement with one of my partners on a Monday Wednesday or Friday.  Plan for revision of right arm  radiocephalic fistula with tunneled dialysis catheter placement    Norman GORMAN Serve MD [facility]

## 2024-06-02 NOTE — Progress Notes (Unsigned)
 Patient ID: James Nicholson, male   DOB: September 27, 1966, 58 y.o.   MRN: [id number]  Reason for Consult: No chief complaint on file.   Referred by Catalina Bare, MD  Subjective:     HPI  James Nicholson is a 58 y.o. male with ESRD and history of right radial cephalic fistula.  He has some areas of aneurysmal dilation in the forearm for which he underwent a fistulogram in march 2025 and two stenoses were treated with balloon angioplasty.  He continues to deny any issues with dialysis or prolonged bleeding. ***  Past Medical History:  Diagnosis Date   AICD (automatic cardioverter/defibrillator) present    Medtronic Visia AF MRIT VR DVFB1D1   Anemia    Atrial fibrillation    Cardiomyopathy (HCC) 2010   Unclear Etiology: Last Echo 06/2009: EF 40-45%, severe Lateral & apical Hypokinesis (? CAD) ; Grade 2 DDysfxn. Mild conc LVH.    Cellulitis    Chronic kidney disease (CKD), stage V (HCC)    Dialysis Tue, Thurs, Sat   Confusion    Depression    no meds   Diabetes mellitus without complication (HCC)    type 2, diet controlled, no meds, pt. denies   Dyslipidemia    Enterobacter sepsis (HCC)    HLD (hyperlipidemia)    Hypertension    Pneumonia    S/P ICD (internal cardiac defibrillator) procedure 2010   VT Arrest (in [place] )   Schizophrenia (HCC)    no meds   Wegener's disease, pulmonary 02/05/2013   Wegener's granulomatosis    Family History  Problem Relation Age of Onset   CAD Mother    Past Surgical History:  Procedure Laterality Date   A/V FISTULAGRAM Right 03/17/2020   Procedure: A/V FISTULAGRAM;  Surgeon: Eliza Lonni RAMAN, MD;  Location: [facility] INVASIVE CV LAB;  Service: Cardiovascular;  Laterality: Right;   A/V FISTULAGRAM Right 01/12/2024   Procedure: A/V Fistulagram;  Surgeon: Pearline Norman RAMAN, MD;  Location: [facility] INVASIVE CV LAB;  Service: Cardiovascular;  Laterality: Right;   AV FISTULA PLACEMENT Right 02/10/2013   Procedure: ARTERIOVENOUS (AV) FISTULA CREATION;   Surgeon: Krystal JULIANNA Doing, MD;  Location: [facility] OR;  Service: Vascular;  Laterality: Right;  Right forearm radial/cephalic arterovenous fistula.    CARDIAC CATHETERIZATION  2010   [place] : In setting of VT arrest. Per brother's report, nonobstructive with no intervention   CARDIAC DEFIBRILLATOR PLACEMENT  2010   [place]    EYE SURGERY Right    FISTULOGRAM Right 08/09/2013   Procedure: FISTULOGRAM;  Surgeon: Lonni RAMAN Eliza, MD;  Location: [facility] CATH LAB;  Service: Cardiovascular;  Laterality: Right;   ICD GENERATOR CHANGEOUT N/A 12/05/2017   Procedure: ICD GENERATOR CHANGEOUT;  Surgeon: Waddell Danelle ORN, MD;  Location: [facility] INVASIVE CV LAB;  Service: Cardiovascular;  Laterality: N/A;   ICD LEAD REMOVAL N/A 01/10/2020   Procedure: ICD LEAD REMOVAL AND IMPLANTATION OF NEW LEAD IN RIGHT VENTRICLE;  Surgeon: Waddell Danelle ORN, MD;  Location: [facility] OR;  Service: Cardiovascular;  Laterality: N/A;   INSERTION OF DIALYSIS CATHETER Right 08/07/2020   Procedure: INSERTION OF DIALYSIS CATHETER;  Surgeon: Eliza Lonni RAMAN, MD;  Location: [facility] OR;  Service: Vascular;  Laterality: Right;   LIGATION OF ARTERIOVENOUS  FISTULA Right 05/08/2020   Procedure: PLICATION OF ARTERIOVENOUS  FISTULA RIGHT ARM;  Surgeon: Eliza Lonni RAMAN, MD;  Location: [facility] OR;  Service: Vascular;  Laterality: Right;   LIGATION OF COMPETING BRANCHES OF ARTERIOVENOUS FISTULA Right 08/13/2013   Procedure: LIGATION OF COMPETING BRANCHES  X5 OF ARTERIOVENOUS FISTULA- RIGHT ARM;  Surgeon: Gaile LELON New, MD;  Location: [facility] OR;  Service: Vascular;  Laterality: Right;   REVISON OF ARTERIOVENOUS FISTULA Right 08/07/2020   Procedure: PLICATION OF ARTERIOVENOUS FISTULA RIGHT;  Surgeon: Eliza Lonni RAMAN, MD;  Location: [facility] OR;  Service: Vascular;  Laterality: Right;   TEE WITHOUT CARDIOVERSION N/A 01/10/2020   Procedure: TRANSESOPHAGEAL ECHOCARDIOGRAM (TEE);  Surgeon: Waddell Danelle LELON, MD;  Location: [facility] OR;  Service: Cardiovascular;  Laterality: N/A;    UPPER EXTREMITY INTERVENTION Right 01/12/2024   Procedure: UPPER EXTREMITY INTERVENTION;  Surgeon: Pearline Norman RAMAN, MD;  Location: [facility] INVASIVE CV LAB;  Service: Cardiovascular;  Laterality: Right;    Short Social History:  Social History   Tobacco Use   Smoking status: Former    Current packs/day: 0.00    Types: Cigarettes    Quit date: 10/28/2008    Years since quitting: 15.6   Smokeless tobacco: Never  Substance Use Topics   Alcohol use: No    No Known Allergies  Current Outpatient Medications  Medication Sig Dispense Refill   AURYXIA  1 GM 210 MG(Fe) tablet Take 630 mg by mouth 3 (three) times daily with meals.     LOKELMA  10 g PACK packet Take 1 packet by mouth daily.     Nutritional Supplements (FEEDING SUPPLEMENT, NEPRO CARB STEADY,) LIQD Take 237 mLs by mouth daily.     Current Facility-Administered Medications  Medication Dose Route Frequency Provider Last Rate Last Admin   sodium chloride  flush (NS) 0.9 % injection 3 mL  3 mL Intravenous PRN Pearline Norman RAMAN, MD        REVIEW OF SYSTEMS   All other systems were reviewed and are negative     Objective:  Objective   There were no vitals filed for this visit.  There is no height or weight on file to calculate BMI.  Physical Exam General: no acute distress Cardiac: hemodynamically stable Vascular: Palpable thrill in radiocephalic fistula, palpable radial pulse.  2 moderately sized aneurysms of the right radiocephalic fistula, skin intact, no ulceration present.      Assessment/Plan:     James Nicholson is a 58 y.o. male with ESRD on HD via right radiocephalic fistula who is presenting for follow up . In March 2025 he underwent fistulogram with treatment of 2 distinct stenoses that were distal to 2 areas of aneurysmal dilation.  Dialysis sessions have been going well without any issues.  I explained the treatment of the stenoses should decrease the risk of continued dilation of the aneurysms. Explained that  definitive treatment of these aneurysms will require open surgery with interposition grafting which I do not believe is indicated at this time given his skin is intact and without ulceration.  Explained that he did not continue with HD has normal and his HD center will reach out to our interventional access center should he have issues in the future. Follow-up as needed     Norman RAMAN Pearline MD [facility]

## 2024-06-02 NOTE — Progress Notes (Signed)
 Remote ICD transmission.

## 2024-06-03 DIAGNOSIS — N186 End stage renal disease: Secondary | ICD-10-CM | POA: Diagnosis not present

## 2024-06-03 DIAGNOSIS — N2581 Secondary hyperparathyroidism of renal origin: Secondary | ICD-10-CM | POA: Diagnosis not present

## 2024-06-03 DIAGNOSIS — D689 Coagulation defect, unspecified: Secondary | ICD-10-CM | POA: Diagnosis not present

## 2024-06-03 DIAGNOSIS — D631 Anemia in chronic kidney disease: Secondary | ICD-10-CM | POA: Diagnosis not present

## 2024-06-03 DIAGNOSIS — E1122 Type 2 diabetes mellitus with diabetic chronic kidney disease: Secondary | ICD-10-CM | POA: Diagnosis not present

## 2024-06-04 ENCOUNTER — Encounter: Payer: Self-pay | Admitting: Vascular Surgery

## 2024-06-04 ENCOUNTER — Ambulatory Visit: Attending: Vascular Surgery | Admitting: Vascular Surgery

## 2024-06-04 ENCOUNTER — Ambulatory Visit (HOSPITAL_COMMUNITY)
Admission: RE | Admit: 2024-06-04 | Discharge: 2024-06-04 | Disposition: A | Discharge status: Home / Self Care | Source: Ambulatory Visit | Attending: Vascular Surgery | Admitting: Vascular Surgery

## 2024-06-04 ENCOUNTER — Other Ambulatory Visit: Payer: Self-pay | Admitting: *Deleted

## 2024-06-04 ENCOUNTER — Encounter: Payer: Self-pay | Admitting: *Deleted

## 2024-06-04 VITALS — BP 121/80 | HR 83 | Temp 98.0°F | Resp 16 | Ht 69.0 in | Wt 168.8 lb

## 2024-06-04 DIAGNOSIS — N186 End stage renal disease: Secondary | ICD-10-CM

## 2024-06-05 DIAGNOSIS — N2581 Secondary hyperparathyroidism of renal origin: Secondary | ICD-10-CM | POA: Diagnosis not present

## 2024-06-05 DIAGNOSIS — D631 Anemia in chronic kidney disease: Secondary | ICD-10-CM | POA: Diagnosis not present

## 2024-06-05 DIAGNOSIS — N186 End stage renal disease: Secondary | ICD-10-CM | POA: Diagnosis not present

## 2024-06-05 DIAGNOSIS — E1122 Type 2 diabetes mellitus with diabetic chronic kidney disease: Secondary | ICD-10-CM | POA: Diagnosis not present

## 2024-06-05 DIAGNOSIS — E875 Hyperkalemia: Secondary | ICD-10-CM | POA: Diagnosis not present

## 2024-06-05 DIAGNOSIS — Z992 Dependence on renal dialysis: Secondary | ICD-10-CM | POA: Diagnosis not present

## 2024-06-05 DIAGNOSIS — D689 Coagulation defect, unspecified: Secondary | ICD-10-CM | POA: Diagnosis not present

## 2024-06-08 DIAGNOSIS — E1122 Type 2 diabetes mellitus with diabetic chronic kidney disease: Secondary | ICD-10-CM | POA: Diagnosis not present

## 2024-06-08 DIAGNOSIS — N186 End stage renal disease: Secondary | ICD-10-CM | POA: Diagnosis not present

## 2024-06-08 DIAGNOSIS — E875 Hyperkalemia: Secondary | ICD-10-CM | POA: Diagnosis not present

## 2024-06-08 DIAGNOSIS — Z992 Dependence on renal dialysis: Secondary | ICD-10-CM | POA: Diagnosis not present

## 2024-06-08 DIAGNOSIS — D689 Coagulation defect, unspecified: Secondary | ICD-10-CM | POA: Diagnosis not present

## 2024-06-08 DIAGNOSIS — D631 Anemia in chronic kidney disease: Secondary | ICD-10-CM | POA: Diagnosis not present

## 2024-06-10 ENCOUNTER — Encounter (HOSPITAL_COMMUNITY): Payer: Self-pay | Admitting: Vascular Surgery

## 2024-06-10 ENCOUNTER — Other Ambulatory Visit: Payer: Self-pay

## 2024-06-10 DIAGNOSIS — N186 End stage renal disease: Secondary | ICD-10-CM | POA: Diagnosis not present

## 2024-06-10 NOTE — Progress Notes (Signed)
 Anesthesia Chart Review: Same day workup  58 yo male follows with cardiology for hx of biventricular failure s/p MDT single chamber ICD. Echo 12/2019 LVEF 30-35%, grade II dd, severely depressed RV function. Nuclear stress 12/2019 with large fixed defect, no reversible ischemia. Last seen in EP followup 07/26/22. Stable at that time. Normal ICD function. Last remote check 612/25 was normal.  ESRD on HD TTS via right radiocephalic fistula. He needs revision due to stenosis and aneurysm.  Follows with rheumatology for hx of granulomatosis with polyangiitis. Maintained on Rituxan  500mg  q 6 months.  Other pertinent hx includes former smoker (quit 2010), diet controlled DM2, schizophrenia, latent Tb treated 2013.  Pt will need DOS labs and eval  EKG 01/12/24: Sinus rhythm with occasional Premature ventricular complexes. Rate 76.  Left axis deviation. Right bundle branch block  Nuclear stress 01/10/20: IMPRESSION: 1. Large fixed defect along the anterolateral wall the left ventricle, raising concern for prior infarction. No reversible ischemia.   2. Mild global hypokinesis with associated left ventricular dilatation.   3. Left ventricular ejection fraction 45%   4. Non invasive risk stratification*: High  TTE 01/09/20: 1. Severe biventricular failure.   2. Left ventricular ejection fraction, by estimation, is 30 to 35%. The  left ventricle has moderately decreased function. The left ventricle has  no regional wall motion abnormalities. The left ventricular internal  cavity size was moderately dilated.  Left ventricular diastolic parameters are consistent with Grade II  diastolic dysfunction (pseudonormalization). Elevated left atrial  pressure.   3. Right ventricular systolic function is severely reduced. The right  ventricular size is severely enlarged. There is mildly elevated pulmonary  artery systolic pressure. The estimated right ventricular systolic  pressure is 30.7 mmHg.   4.  Left atrial size was moderately dilated.   5. Right atrial size was moderately dilated.   6. The mitral valve is normal in structure. Mild mitral valve  regurgitation. No evidence of mitral stenosis.   7. The aortic valve is normal in structure. Aortic valve regurgitation is  not visualized. No aortic stenosis is present.   8. The inferior vena cava is normal in size with greater than 50%  respiratory variability, suggesting right atrial pressure of 3 mmHg.     Lynwood Geofm RIGGERS [facility] Short Stay Center/Anesthesiology Phone [phone] 06/10/2024 9:27 AM

## 2024-06-10 NOTE — Progress Notes (Signed)
 SDW call  Patient's borther Velcro was given pre-op instructions over the phone. [place] interpreter [place], # [phone] on the line, he declines the use of the interpreter.  He verbalized understanding of instructions provided.He denied patient has SOB, CP or fever    PCP - Dr. Zachary Conger EP Cardiologist - Dr. Danelle Birmingham  Pulmonary:    PPM/ICD - Yes, ICD, [employer].  States he goes to [facility] every 6 month for defib check. Patient went into [facility]-tach in [place], brother performed CPR until EMS arrived. Had a cath and defib placed Device Orders - requested Rep Notified - Yes, [facility]   Chest x-ray - na EKG -  01/12/2024 Stress Test - 12/2019 ECHO - 01/10/2020 Cardiac Cath - 2010  Sleep Study/sleep apnea/CPAP: denies  Type II diabetic.  A1C 4.6 02/12/2024.  Takes no medications for diabetes, does not check his sugars Fasting Blood sugar range: na How often check sugars: na   Blood Thinner Instructions: denies Aspirin  Instructions:denies   ERAS Protcol - NPO  Anesthesia review: Yes. HLD, A-fib, HTN, DM, ESRD with dialysis T, TH, sat  Your procedure is scheduled on Friday June 11, 2024  Report to [facility] Main Entrance A at  0720  A.M., then check in with the Admitting office.  Call this number if you have problems the morning of surgery:  [phone]   If you have any questions prior to your surgery date call [phone]: Open Monday-Friday 8am-4pm If you experience any cold or flu symptoms such as cough, fever, chills, shortness of breath, etc. between now and your scheduled surgery, please notify us  at the above number    Remember:  Do not eat or drink after midnight the night before your surgery  Take these medicines the morning of surgery with A SIP OF WATER:  None   As of today, STOP taking any Aspirin  (unless otherwise instructed by your surgeon) Aleve, Naproxen, Ibuprofen, Motrin, Advil, Goody's, BC's, all herbal medications, fish oil, and all  vitamins.

## 2024-06-10 NOTE — Anesthesia Preprocedure Evaluation (Addendum)
 Anesthesia Evaluation  Patient identified by MRN, date of birth, ID band Patient awake    Reviewed: Allergy & Precautions, H&P , NPO status , Patient's Chart, lab work & pertinent test results  Airway Mallampati: II  TM Distance: >3 FB Neck ROM: Full    Dental no notable dental hx. (+) Partial Lower, Partial Upper, Poor Dentition, Dental Advisory Given   Pulmonary shortness of breath, former smoker   Pulmonary exam normal breath sounds clear to auscultation       Cardiovascular hypertension, +CHF  + Cardiac Defibrillator  Rhythm:Regular Rate:Normal     Neuro/Psych    Depression    negative neurological ROS     GI/Hepatic Neg liver ROS,GERD  ,,  Endo/Other  diabetes, Well Controlled    Renal/GU ESRF and DialysisRenal disease  negative genitourinary   Musculoskeletal   Abdominal   Peds  Hematology  (+) Blood dyscrasia, anemia   Anesthesia Other Findings   Reproductive/Obstetrics negative OB ROS                              Anesthesia Physical Anesthesia Plan  ASA: 4  Anesthesia Plan: General   Post-op Pain Management: Ofirmev  IV (intra-op)*   Induction: Intravenous  PONV Risk Score and Plan: 3 and Ondansetron , Dexamethasone  and Treatment may vary due to age or medical condition  Airway Management Planned: Oral ETT  Additional Equipment:   Intra-op Plan:   Post-operative Plan: Extubation in OR  Informed Consent: I have reviewed the patients History and Physical, chart, labs and discussed the procedure including the risks, benefits and alternatives for the proposed anesthesia with the patient or authorized representative who has indicated his/her understanding and acceptance.     Dental advisory given and Interpreter used for interview  Plan Discussed with: CRNA  Anesthesia Plan Comments: ( PAT note by Lynwood Hope, PA-C: 58 yo male follows with cardiology for hx of  biventricular failure s/p MDT single chamber ICD. Echo 12/2019 LVEF 30-35%, grade II dd, severely depressed RV function. Nuclear stress 12/2019 with large fixed defect, no reversible ischemia. Last seen in EP followup 07/26/22. Stable at that time. Normal ICD function. Last remote check 612/25 was normal.  ESRD on HD TTS via right radiocephalic fistula. He needs revision due to stenosis and aneurysm.  Follows with rheumatology for hx of granulomatosis with polyangiitis. Maintained on Rituxan  500mg  q 6 months.  Other pertinent hx includes former smoker (quit 2010), diet controlled DM2, schizophrenia, latent Tb treated 2013.  Pt will need DOS labs and eval  EKG 01/12/24: Sinus rhythm with occasional Premature ventricular complexes. Rate 76.  Left axis deviation. Right bundle branch block  Nuclear stress 01/10/20: IMPRESSION: 1. Large fixed defect along the anterolateral wall the left ventricle, raising concern for prior infarction. No reversible ischemia.  2. Mild global hypokinesis with associated left ventricular dilatation.  3. Left ventricular ejection fraction 45%  4. Non invasive risk stratification*: High  TTE 01/09/20: 1. Severe biventricular failure.  2. Left ventricular ejection fraction, by estimation, is 30 to 35%. The  left ventricle has moderately decreased function. The left ventricle has  no regional wall motion abnormalities. The left ventricular internal  cavity size was moderately dilated.  Left ventricular diastolic parameters are consistent with Grade II  diastolic dysfunction (pseudonormalization). Elevated left atrial  pressure.  3. Right ventricular systolic function is severely reduced. The right  ventricular size is severely enlarged. There is mildly elevated pulmonary  artery systolic pressure. The estimated right ventricular systolic  pressure is 30.7 mmHg.  4. Left atrial size was moderately dilated.  5. Right atrial size was moderately dilated.   6. The mitral valve is normal in structure. Mild mitral valve  regurgitation. No evidence of mitral stenosis.  7. The aortic valve is normal in structure. Aortic valve regurgitation is  not visualized. No aortic stenosis is present.  8. The inferior vena cava is normal in size with greater than 50%  respiratory variability, suggesting right atrial pressure of 3 mmHg.    )         Anesthesia Quick Evaluation

## 2024-06-11 ENCOUNTER — Ambulatory Visit (HOSPITAL_BASED_OUTPATIENT_CLINIC_OR_DEPARTMENT_OTHER): Admitting: Physician Assistant

## 2024-06-11 ENCOUNTER — Encounter (HOSPITAL_COMMUNITY)
Admission: RE | Disposition: A | Discharge status: Home / Self Care | Payer: Self-pay | Source: Home / Self Care | Attending: Vascular Surgery

## 2024-06-11 ENCOUNTER — Ambulatory Visit (HOSPITAL_COMMUNITY): Admitting: Physician Assistant

## 2024-06-11 ENCOUNTER — Other Ambulatory Visit: Payer: Self-pay

## 2024-06-11 ENCOUNTER — Encounter (HOSPITAL_COMMUNITY): Payer: Self-pay | Admitting: Vascular Surgery

## 2024-06-11 ENCOUNTER — Ambulatory Visit (HOSPITAL_COMMUNITY)

## 2024-06-11 ENCOUNTER — Ambulatory Visit (HOSPITAL_COMMUNITY)
Admission: RE | Admit: 2024-06-11 | Discharge: 2024-06-11 | Disposition: A | Discharge status: Home / Self Care | Attending: Vascular Surgery | Admitting: Vascular Surgery

## 2024-06-11 DIAGNOSIS — N186 End stage renal disease: Secondary | ICD-10-CM | POA: Diagnosis not present

## 2024-06-11 DIAGNOSIS — E785 Hyperlipidemia, unspecified: Secondary | ICD-10-CM | POA: Diagnosis not present

## 2024-06-11 DIAGNOSIS — Z95810 Presence of automatic (implantable) cardiac defibrillator: Secondary | ICD-10-CM | POA: Insufficient documentation

## 2024-06-11 DIAGNOSIS — R0989 Other specified symptoms and signs involving the circulatory and respiratory systems: Secondary | ICD-10-CM | POA: Diagnosis not present

## 2024-06-11 DIAGNOSIS — I132 Hypertensive heart and chronic kidney disease with heart failure and with stage 5 chronic kidney disease, or end stage renal disease: Secondary | ICD-10-CM | POA: Diagnosis not present

## 2024-06-11 DIAGNOSIS — T82898A Other specified complication of vascular prosthetic devices, implants and grafts, initial encounter: Secondary | ICD-10-CM | POA: Diagnosis not present

## 2024-06-11 DIAGNOSIS — M3130 Wegener's granulomatosis without renal involvement: Secondary | ICD-10-CM | POA: Insufficient documentation

## 2024-06-11 DIAGNOSIS — I5022 Chronic systolic (congestive) heart failure: Secondary | ICD-10-CM

## 2024-06-11 DIAGNOSIS — T82590A Other mechanical complication of surgically created arteriovenous fistula, initial encounter: Secondary | ICD-10-CM | POA: Diagnosis not present

## 2024-06-11 DIAGNOSIS — Z992 Dependence on renal dialysis: Secondary | ICD-10-CM

## 2024-06-11 DIAGNOSIS — E1122 Type 2 diabetes mellitus with diabetic chronic kidney disease: Secondary | ICD-10-CM | POA: Diagnosis not present

## 2024-06-11 DIAGNOSIS — Z87891 Personal history of nicotine dependence: Secondary | ICD-10-CM | POA: Insufficient documentation

## 2024-06-11 DIAGNOSIS — I2510 Atherosclerotic heart disease of native coronary artery without angina pectoris: Secondary | ICD-10-CM | POA: Insufficient documentation

## 2024-06-11 DIAGNOSIS — I4891 Unspecified atrial fibrillation: Secondary | ICD-10-CM | POA: Insufficient documentation

## 2024-06-11 DIAGNOSIS — Y828 Other medical devices associated with adverse incidents: Secondary | ICD-10-CM | POA: Insufficient documentation

## 2024-06-11 DIAGNOSIS — T82858A Stenosis of vascular prosthetic devices, implants and grafts, initial encounter: Secondary | ICD-10-CM | POA: Diagnosis not present

## 2024-06-11 DIAGNOSIS — Z452 Encounter for adjustment and management of vascular access device: Secondary | ICD-10-CM | POA: Diagnosis not present

## 2024-06-11 DIAGNOSIS — T85898A Other specified complication of other internal prosthetic devices, implants and grafts, initial encounter: Secondary | ICD-10-CM

## 2024-06-11 DIAGNOSIS — J9811 Atelectasis: Secondary | ICD-10-CM | POA: Diagnosis not present

## 2024-06-11 DIAGNOSIS — R9389 Abnormal findings on diagnostic imaging of other specified body structures: Secondary | ICD-10-CM | POA: Diagnosis not present

## 2024-06-11 LAB — POCT I-STAT, CHEM 8
BUN: 49 mg/dL — ABNORMAL HIGH (ref 6–20)
Calcium, Ion: 1.13 mmol/L — ABNORMAL LOW (ref 1.15–1.40)
Chloride: 101 mmol/L (ref 98–111)
Creatinine, Ser: 9.5 mg/dL — ABNORMAL HIGH (ref 0.61–1.24)
Glucose, Bld: 91 mg/dL (ref 70–99)
HCT: 49 % (ref 39.0–52.0)
Hemoglobin: 16.7 g/dL (ref 13.0–17.0)
Potassium: 5.3 mmol/L — ABNORMAL HIGH (ref 3.5–5.1)
Sodium: 135 mmol/L (ref 135–145)
TCO2: 26 mmol/L (ref 22–32)

## 2024-06-11 LAB — GLUCOSE, CAPILLARY
Glucose-Capillary: 119 mg/dL — ABNORMAL HIGH (ref 70–99)
Glucose-Capillary: 91 mg/dL (ref 70–99)
Glucose-Capillary: 116 mg/dL — ABNORMAL HIGH (ref 70–99)

## 2024-06-11 SURGERY — REVISION OF ARTERIOVENOUS GORETEX GRAFT
Anesthesia: General | Site: Arm Lower | Laterality: Right

## 2024-06-11 MED ORDER — PROPOFOL 10 MG/ML IV BOLUS
INTRAVENOUS | Status: AC
  Filled 2024-06-11: qty 20

## 2024-06-11 MED ORDER — HYDROCODONE-ACETAMINOPHEN 5-325 MG PO TABS: 1.0000 | ORAL_TABLET | Freq: Four times a day (QID) | ORAL | 0 refills | Status: AC | PRN

## 2024-06-11 MED ORDER — ROCURONIUM BROMIDE 10 MG/ML (PF) SYRINGE
PREFILLED_SYRINGE | INTRAVENOUS | Status: DC | PRN
  Administered 2024-06-11: 50 mg via INTRAVENOUS

## 2024-06-11 MED ORDER — SODIUM CHLORIDE 0.9 % IV SOLN: INTRAVENOUS | Status: DC

## 2024-06-11 MED ORDER — PROPOFOL 10 MG/ML IV BOLUS
INTRAVENOUS | Status: DC | PRN
  Administered 2024-06-11: 100 mg via INTRAVENOUS

## 2024-06-11 MED ORDER — ORAL CARE MOUTH RINSE: 15.0000 mL | Freq: Once | OROMUCOSAL | Status: AC

## 2024-06-11 MED ORDER — FENTANYL CITRATE (PF) 100 MCG/2ML IJ SOLN: 25.0000 ug | INTRAMUSCULAR | Status: DC | PRN

## 2024-06-11 MED ORDER — LIDOCAINE 2% (20 MG/ML) 5 ML SYRINGE
INTRAMUSCULAR | Status: DC | PRN
  Administered 2024-06-11: 60 mg via INTRAVENOUS

## 2024-06-11 MED ORDER — PHENYLEPHRINE 80 MCG/ML (10ML) SYRINGE FOR IV PUSH (FOR BLOOD PRESSURE SUPPORT)
PREFILLED_SYRINGE | INTRAVENOUS | Status: DC | PRN
  Administered 2024-06-11 (×3): 80 ug via INTRAVENOUS

## 2024-06-11 MED ORDER — CEFAZOLIN SODIUM-DEXTROSE 2-4 GM/100ML-% IV SOLN
2.0000 g | INTRAVENOUS | Status: AC
  Administered 2024-06-11: 2 g via INTRAVENOUS
  Filled 2024-06-11: qty 100

## 2024-06-11 MED ORDER — PROTAMINE SULFATE 10 MG/ML IV SOLN
INTRAVENOUS | Status: DC | PRN
  Administered 2024-06-11: 5 mg via INTRAVENOUS

## 2024-06-11 MED ORDER — EPHEDRINE SULFATE-NACL 50-0.9 MG/10ML-% IV SOSY
PREFILLED_SYRINGE | INTRAVENOUS | Status: DC | PRN
  Administered 2024-06-11 (×2): 5 mg via INTRAVENOUS

## 2024-06-11 MED ORDER — ONDANSETRON HCL 4 MG/2ML IJ SOLN
INTRAMUSCULAR | Status: DC | PRN
  Administered 2024-06-11: 4 mg via INTRAVENOUS

## 2024-06-11 MED ORDER — FENTANYL CITRATE (PF) 100 MCG/2ML IJ SOLN
INTRAMUSCULAR | Status: AC
Start: 2024-06-11 — End: 2024-06-11
  Filled 2024-06-11: qty 2

## 2024-06-11 MED ORDER — HEMOSTATIC AGENTS (NO CHARGE) OPTIME
TOPICAL | Status: DC | PRN
  Administered 2024-06-11: 1 via TOPICAL

## 2024-06-11 MED ORDER — CHLORHEXIDINE GLUCONATE 4 % EX SOLN: 60.0000 mL | Freq: Once | CUTANEOUS | Status: DC

## 2024-06-11 MED ORDER — HEPARIN SODIUM (PORCINE) 1000 UNIT/ML IJ SOLN
INTRAMUSCULAR | Status: DC | PRN
  Administered 2024-06-11: 3200 [IU]

## 2024-06-11 MED ORDER — 0.9 % SODIUM CHLORIDE (POUR BTL) OPTIME
TOPICAL | Status: DC | PRN
  Administered 2024-06-11: 1000 mL

## 2024-06-11 MED ORDER — FENTANYL CITRATE (PF) 250 MCG/5ML IJ SOLN
INTRAMUSCULAR | Status: DC | PRN
  Administered 2024-06-11 (×4): 50 ug via INTRAVENOUS

## 2024-06-11 MED ORDER — FENTANYL CITRATE (PF) 250 MCG/5ML IJ SOLN
INTRAMUSCULAR | Status: AC
  Filled 2024-06-11: qty 5

## 2024-06-11 MED ORDER — HEPARIN 6000 UNIT IRRIGATION SOLUTION
Status: DC | PRN
  Administered 2024-06-11: 1

## 2024-06-11 MED ORDER — FENTANYL CITRATE (PF) 100 MCG/2ML IJ SOLN
25.0000 ug | INTRAMUSCULAR | Status: DC | PRN
  Administered 2024-06-11: 50 ug via INTRAVENOUS

## 2024-06-11 MED ORDER — PHENYLEPHRINE HCL-NACL 20-0.9 MG/250ML-% IV SOLN
INTRAVENOUS | Status: DC | PRN
  Administered 2024-06-11: 50 ug/min via INTRAVENOUS

## 2024-06-11 MED ORDER — HEPARIN SODIUM (PORCINE) 1000 UNIT/ML IJ SOLN
INTRAMUSCULAR | Status: DC | PRN
  Administered 2024-06-11: 3000 [IU] via INTRAVENOUS

## 2024-06-11 MED ORDER — SUGAMMADEX SODIUM 200 MG/2ML IV SOLN
INTRAVENOUS | Status: DC | PRN
  Administered 2024-06-11: 200 mg via INTRAVENOUS

## 2024-06-11 MED ORDER — DEXAMETHASONE SODIUM PHOSPHATE 10 MG/ML IJ SOLN
INTRAMUSCULAR | Status: DC | PRN
  Administered 2024-06-11: 5 mg via INTRAVENOUS

## 2024-06-11 MED ORDER — CHLORHEXIDINE GLUCONATE 0.12 % MT SOLN
15.0000 mL | Freq: Once | OROMUCOSAL | Status: AC
  Administered 2024-06-11: 15 mL via OROMUCOSAL
  Filled 2024-06-11: qty 15

## 2024-06-11 SURGICAL SUPPLY — 57 items
ARMBAND PINK RESTRICT EXTREMIT (MISCELLANEOUS) ×6 IMPLANT
BAG COUNTER SPONGE SURGICOUNT (BAG) ×3 IMPLANT
BAG DECANTER FOR FLEXI CONT (MISCELLANEOUS) ×3 IMPLANT
BIOPATCH RED 1 DISK 7.0 (GAUZE/BANDAGES/DRESSINGS) ×3 IMPLANT
BNDG ELASTIC 4X5.8 VLCR NS LF (GAUZE/BANDAGES/DRESSINGS) IMPLANT
BNDG GAUZE DERMACEA FLUFF 4 (GAUZE/BANDAGES/DRESSINGS) IMPLANT
CANISTER SUCTION 3000ML PPV (SUCTIONS) ×3 IMPLANT
CATH PALINDROME-P 19CM W/VT (CATHETERS) IMPLANT
CATH PALINDROME-P 28CM W/VT (CATHETERS) IMPLANT
CLIP LIGATING EXTRA MED SLVR (CLIP) ×3 IMPLANT
CLIP LIGATING EXTRA SM BLUE (MISCELLANEOUS) ×3 IMPLANT
COVER DOME SNAP 22 D (MISCELLANEOUS) IMPLANT
COVER PROBE W GEL 5X96 (DRAPES) ×3 IMPLANT
COVER SURGICAL LIGHT HANDLE (MISCELLANEOUS) ×3 IMPLANT
DERMABOND ADVANCED .7 DNX12 (GAUZE/BANDAGES/DRESSINGS) ×3 IMPLANT
DRAPE C-ARM 42X72 X-RAY (DRAPES) ×3 IMPLANT
DRAPE CHEST BREAST 15X10 FENES (DRAPES) ×3 IMPLANT
DRSG ADAPTIC 3X8 NADH LF (GAUZE/BANDAGES/DRESSINGS) IMPLANT
ELECTRODE REM PT RTRN 9FT ADLT (ELECTROSURGICAL) ×3 IMPLANT
GAUZE 4X4 16PLY ~~LOC~~+RFID DBL (SPONGE) ×3 IMPLANT
GAUZE SPONGE 4X4 12PLY STRL (GAUZE/BANDAGES/DRESSINGS) IMPLANT
GLOVE BIO SURGEON STRL SZ7.5 (GLOVE) ×3 IMPLANT
GOWN STRL REUS W/ TWL LRG LVL3 (GOWN DISPOSABLE) ×6 IMPLANT
GOWN STRL REUS W/ TWL XL LVL3 (GOWN DISPOSABLE) ×3 IMPLANT
GRAFT COLLAGEN VASCULAR 6X19 (Vascular Products) IMPLANT
INSERT FOGARTY SM (MISCELLANEOUS) ×3 IMPLANT
KIT BASIN OR (CUSTOM PROCEDURE TRAY) ×3 IMPLANT
KIT PALINDROME-P 55CM (CATHETERS) IMPLANT
KIT TURNOVER KIT B (KITS) ×3 IMPLANT
NDL 18GX1X1/2 (RX/OR ONLY) (NEEDLE) ×3 IMPLANT
NDL HYPO 25GX1X1/2 BEV (NEEDLE) ×3 IMPLANT
NEEDLE 18GX1X1/2 (RX/OR ONLY) (NEEDLE) ×3 IMPLANT
NEEDLE HYPO 25GX1X1/2 BEV (NEEDLE) ×3 IMPLANT
NS IRRIG 1000ML POUR BTL (IV SOLUTION) ×3 IMPLANT
PACK CV ACCESS (CUSTOM PROCEDURE TRAY) ×3 IMPLANT
PACK SRG BSC III STRL LF ECLPS (CUSTOM PROCEDURE TRAY) ×3 IMPLANT
PAD ARMBOARD POSITIONER FOAM (MISCELLANEOUS) ×6 IMPLANT
POWDER SURGICEL 3.0 GRAM (HEMOSTASIS) IMPLANT
SET MICROPUNCTURE 5F STIFF (MISCELLANEOUS) IMPLANT
SOAP 2 % CHG 4 OZ (WOUND CARE) ×3 IMPLANT
STAPLER SKIN PROX 35W (STAPLE) IMPLANT
SUT ETHILON 3 0 PS 1 (SUTURE) ×3 IMPLANT
SUT GORETEX 6.0 TH-9 30 IN (SUTURE) ×3 IMPLANT
SUT GORETEX CV-6TTC-13 36IN (SUTURE) ×3 IMPLANT
SUT MNCRL AB 4-0 PS2 18 (SUTURE) ×3 IMPLANT
SUT PROLENE 5 0 C 1 24 (SUTURE) IMPLANT
SUT PROLENE 6 0 BV (SUTURE) ×6 IMPLANT
SUT VIC AB 3-0 SH 27X BRD (SUTURE) ×6 IMPLANT
SYR 10ML LL (SYRINGE) ×3 IMPLANT
SYR 20ML LL LF (SYRINGE) ×6 IMPLANT
SYR 5ML LL (SYRINGE) ×3 IMPLANT
SYR CONTROL 10ML LL (SYRINGE) ×3 IMPLANT
SYR TOOMEY 50ML (SYRINGE) IMPLANT
TOWEL GREEN STERILE (TOWEL DISPOSABLE) ×3 IMPLANT
TOWEL GREEN STERILE FF (TOWEL DISPOSABLE) ×6 IMPLANT
UNDERPAD 30X36 HEAVY ABSORB (UNDERPADS AND DIAPERS) ×3 IMPLANT
WATER STERILE IRR 1000ML POUR (IV SOLUTION) ×3 IMPLANT

## 2024-06-11 NOTE — Transfer of Care (Signed)
 Immediate Anesthesia Transfer of Care Note  Patient: James Nicholson  Procedure(s) Performed: REVISION OF ARTERIOVENOUS GRAFT (Right) INSERTION OF DIALYSIS CATHETER INSERTION OF ARTERIOVENOUS (AV) INTERPOSITION ARTEGRAFT ARM (Right: Arm Lower)  Patient Location: PACU  Anesthesia Type:General  Level of Consciousness: awake, alert , oriented, and patient cooperative  Airway & Oxygen Therapy: Patient Spontanous Breathing and Patient connected to nasal cannula oxygen  Post-op Assessment: Report given to RN and Post -op Vital signs reviewed and stable  Post vital signs: Reviewed and stable  Last Vitals:  Vitals Value Taken Time  BP 119/77 06/11/24 12:16  Temp    Pulse 106 06/11/24 12:20  Resp 15 06/11/24 12:20  SpO2 98 % 06/11/24 12:20  Vitals shown include unfiled device data.  Last Pain:  Vitals:   06/11/24 0812  TempSrc:   PainSc: 0-No pain         Complications: No notable events documented.

## 2024-06-11 NOTE — Interval H&P Note (Signed)
 History and Physical Interval Note:  06/11/2024 9:32 AM  James Nicholson  has presented today for surgery, with the diagnosis of ESRD.  The various methods of treatment have been discussed with the patient and family. After consideration of risks, benefits and other options for treatment, the patient has consented to  Procedure(s): REVISION OF ARTERIOVENOUS GORETEX GRAFT (Right) INSERTION OF DIALYSIS CATHETER (N/A) as a surgical intervention.  The patient's history has been reviewed, patient examined, no change in status, stable for surgery.  I have reviewed the patient's chart and labs.  Questions were answered to the patient's satisfaction.     Penne [place]

## 2024-06-11 NOTE — Discharge Instructions (Signed)

## 2024-06-11 NOTE — Anesthesia Procedure Notes (Signed)
 Procedure Name: Intubation Date/Time: 06/11/2024 10:23 AM  Performed by: Virgil Ee, CRNAPre-anesthesia Checklist: Patient identified, Patient being monitored, Timeout performed, Emergency Drugs available and Suction available Patient Re-evaluated:Patient Re-evaluated prior to induction Oxygen Delivery Method: Circle system utilized Preoxygenation: Pre-oxygenation with 100% oxygen Induction Type: IV induction Ventilation: Mask ventilation without difficulty Laryngoscope Size: Mac and 4 Grade View: Grade I Tube type: Oral Tube size: 7.0 mm Number of attempts: 1 Airway Equipment and Method: Stylet Placement Confirmation: ETT inserted through vocal cords under direct vision, positive ETCO2 and breath sounds checked- equal and bilateral Secured at: 22 cm Tube secured with: Tape Dental Injury: Teeth and Oropharynx as per pre-operative assessment

## 2024-06-11 NOTE — Op Note (Signed)
 Patient name: James Nicholson MRN: [id number] DOB: 09-Jan-1966 Sex: male  06/11/2024 Pre-operative Diagnosis: ESRD, malfunction right arm AV fistula Post-operative diagnosis:  Same Surgeon:  Penne BROCKS. Sheree, MD  Assistant: Adina Sender, PA Procedure Performed: 1.  Placement right IJ 19 cm tunneled dialysis catheter with ultrasound and fluoroscopic guidance 2.  Revision right forearm AV fistula with interposition 6 mm Artegraft  Indications: 58 year old male with history of end-stage renal disease dialyzing via right forearm AV fistula.  He has areas of pseudoaneurysmal degeneration as well as stenoses in the fistula and is indicated for revision.  He will also need tunneled catheter placement while the wound heal.  Experience assistant was necessary to facilitate exposure of the fistula throughout the forearm and creating interposition Artegraft.  Findings: Right IJ was patent and compressible and a new tunneled dialysis catheter was placed to the SVC atrial junction and did flush and withdraw heparinized saline easily and was locked with 1.6 cc of concentrated heparin  and both ports.  The fistula in the right forearm was redundant with 2 areas of large pseudoaneurysm and was heavily calcified on the edges of both pseudoaneurysms.  Given the stenosis and pseudoaneurysmal degeneration I elected to replace the segment of the fistula with interposition Artegraft.   Procedure:  The patient was identified in the holding area and taken to the operating room the operating room where he was placed supine operative when general anesthesia was induced.  He was daily prepped and draped in the bilateral neck and chest as well as right upper extremity in the usual fashion, antibiotics were administered a timeout was called.  We began using ultrasound to identify the right internal jugular vein which was patent and compressible.  This was cannulated with a micropuncture needle followed by a micropuncture wire  and sheath.  The J-wire was then placed under fluoroscopic guidance into the IVC.  A 19 cm catheter was tunneled from a counterincision.  The wire tract was serially dilated the introducer sheath was placed under fluoroscopic guidance.  The catheter was then placed to the SVC atrial junction.  The cath was flushed and did not easily withdraw heparinized saline was locked with 1.6 cc of concentrated heparin  in both ports.  It was affixed to the skin with 3-0 nylon suture and the neck incision was closed with Monocryl and Dermabond was placed at both sites followed by a sterile dressing.  Attention was then turned to the right upper extremity.  I evaluated the fistula with ultrasound unfortunately there were areas of stenosis around both areas of pseudoaneurysmal degeneration.  I elected to make an incision proximally and distally around the pseudoaneurysms dissected down and gave proximal distal control.  I then dissected out the entirety of the fistula through wound continuous incision.  The fistula was somewhat serpiginous and given the areas of tortuosity I elected that this was really not salvageable.  3000 units of heparin  was administered.  We transected the fistula proximally and distally where the only healthy appearing areas.  We then prepared 6 mm Artegraft.  This was sewn end to end first to the more central area of the fistula.  This was then flushed with heparinized saline.  I trimmed the graft to size and sewed it toward the arterial anastomosis segment of the fistula again in an anterior and fashion with 5-0 Prolene suture.  Prior completion without flushing all directions.  Upon completion there is very strong thrill throughout the fistula I could trace this with Doppler  of the upper arm.  25 mg of protamine  was administered we obtained hemostasis in the wound and then closed in layers with interrupted Vicryl followed by staples at the skin level.  Plan will be to rule out the fistula to heal while he  uses the catheter for at least 3 to 4 weeks.  He was awakened from anesthesia having tolerated the procedure without any complication.  All counts were correct at completion.  EBL: 100 cc    Sadiya Durand C. Sheree, MD [facility] Office: [phone] Pager: [phone]

## 2024-06-11 NOTE — Anesthesia Postprocedure Evaluation (Signed)
 Anesthesia Post Note  Patient: James Nicholson  Procedure(s) Performed: REVISION OF ARTERIOVENOUS GRAFT (Right) INSERTION OF DIALYSIS CATHETER INSERTION OF ARTERIOVENOUS (AV) INTERPOSITION ARTEGRAFT ARM (Right: Arm Lower)     Patient location during evaluation: PACU Anesthesia Type: General Level of consciousness: awake and alert Pain management: pain level controlled Vital Signs Assessment: post-procedure vital signs reviewed and stable Respiratory status: spontaneous breathing, nonlabored ventilation and respiratory function stable Cardiovascular status: blood pressure returned to baseline and stable Postop Assessment: no apparent nausea or vomiting Anesthetic complications: no   No notable events documented.  Last Vitals:  Vitals:   06/11/24 1235 06/11/24 1245  BP: 117/72 107/70  Pulse: 97 94  Resp: 20 18  Temp:  37.2 C  SpO2: 94% 94%    Last Pain:  Vitals:   06/11/24 1245  TempSrc:   PainSc: 3                  Zacchary Pompei,W. EDMOND

## 2024-06-14 ENCOUNTER — Telehealth: Payer: Self-pay

## 2024-06-14 ENCOUNTER — Encounter (HOSPITAL_COMMUNITY): Payer: Self-pay | Admitting: Vascular Surgery

## 2024-06-14 NOTE — Telephone Encounter (Signed)
 Velcro [place] called to ask when to remove James Nicholson's AVF site bandage that was placed after his procedure on on 06/11/24.   Advised to remove bandage today and if there is bleeding, redness, drainage or swelling to call us  back.   Velcro verbalized understanding of above instructions.

## 2024-06-15 ENCOUNTER — Telehealth: Payer: Self-pay

## 2024-06-15 DIAGNOSIS — E875 Hyperkalemia: Secondary | ICD-10-CM | POA: Diagnosis not present

## 2024-06-15 DIAGNOSIS — E1122 Type 2 diabetes mellitus with diabetic chronic kidney disease: Secondary | ICD-10-CM | POA: Diagnosis not present

## 2024-06-15 DIAGNOSIS — N186 End stage renal disease: Secondary | ICD-10-CM | POA: Diagnosis not present

## 2024-06-15 DIAGNOSIS — D631 Anemia in chronic kidney disease: Secondary | ICD-10-CM | POA: Diagnosis not present

## 2024-06-15 DIAGNOSIS — Z992 Dependence on renal dialysis: Secondary | ICD-10-CM | POA: Diagnosis not present

## 2024-06-15 DIAGNOSIS — D689 Coagulation defect, unspecified: Secondary | ICD-10-CM | POA: Diagnosis not present

## 2024-06-15 NOTE — Telephone Encounter (Signed)
 Velcro Quilter called to ask when the patient's incision staples would be removed. He was given the appointment date and time information.   He verbalized understanding.

## 2024-06-19 DIAGNOSIS — N186 End stage renal disease: Secondary | ICD-10-CM | POA: Diagnosis not present

## 2024-06-26 DIAGNOSIS — D689 Coagulation defect, unspecified: Secondary | ICD-10-CM | POA: Diagnosis not present

## 2024-06-27 DIAGNOSIS — N186 End stage renal disease: Secondary | ICD-10-CM | POA: Diagnosis not present

## 2024-06-27 DIAGNOSIS — Z992 Dependence on renal dialysis: Secondary | ICD-10-CM | POA: Diagnosis not present

## 2024-06-27 DIAGNOSIS — N032 Chronic nephritic syndrome with diffuse membranous glomerulonephritis: Secondary | ICD-10-CM | POA: Diagnosis not present

## 2024-06-29 DIAGNOSIS — D631 Anemia in chronic kidney disease: Secondary | ICD-10-CM | POA: Diagnosis not present

## 2024-06-29 DIAGNOSIS — M3130 Wegener's granulomatosis without renal involvement: Secondary | ICD-10-CM | POA: Diagnosis not present

## 2024-06-29 DIAGNOSIS — R519 Headache, unspecified: Secondary | ICD-10-CM | POA: Diagnosis not present

## 2024-06-29 DIAGNOSIS — I4510 Unspecified right bundle-branch block: Secondary | ICD-10-CM | POA: Diagnosis not present

## 2024-06-29 DIAGNOSIS — I130 Hypertensive heart and chronic kidney disease with heart failure and stage 1 through stage 4 chronic kidney disease, or unspecified chronic kidney disease: Secondary | ICD-10-CM | POA: Diagnosis not present

## 2024-06-29 DIAGNOSIS — I429 Cardiomyopathy, unspecified: Secondary | ICD-10-CM | POA: Diagnosis not present

## 2024-06-29 DIAGNOSIS — E875 Hyperkalemia: Secondary | ICD-10-CM | POA: Diagnosis not present

## 2024-06-29 DIAGNOSIS — I132 Hypertensive heart and chronic kidney disease with heart failure and with stage 5 chronic kidney disease, or end stage renal disease: Secondary | ICD-10-CM | POA: Diagnosis not present

## 2024-06-29 DIAGNOSIS — M3131 Wegener's granulomatosis with renal involvement: Secondary | ICD-10-CM | POA: Diagnosis not present

## 2024-06-29 DIAGNOSIS — R221 Localized swelling, mass and lump, neck: Secondary | ICD-10-CM | POA: Diagnosis not present

## 2024-06-29 DIAGNOSIS — I509 Heart failure, unspecified: Secondary | ICD-10-CM | POA: Diagnosis not present

## 2024-06-29 DIAGNOSIS — R222 Localized swelling, mass and lump, trunk: Secondary | ICD-10-CM | POA: Diagnosis not present

## 2024-06-29 DIAGNOSIS — Z8674 Personal history of sudden cardiac arrest: Secondary | ICD-10-CM | POA: Diagnosis not present

## 2024-06-29 DIAGNOSIS — I452 Bifascicular block: Secondary | ICD-10-CM | POA: Diagnosis not present

## 2024-06-29 DIAGNOSIS — I2510 Atherosclerotic heart disease of native coronary artery without angina pectoris: Secondary | ICD-10-CM | POA: Diagnosis not present

## 2024-06-29 DIAGNOSIS — Z95810 Presence of automatic (implantable) cardiac defibrillator: Secondary | ICD-10-CM | POA: Diagnosis not present

## 2024-06-29 DIAGNOSIS — E8770 Fluid overload, unspecified: Secondary | ICD-10-CM | POA: Diagnosis not present

## 2024-06-29 DIAGNOSIS — I7782 Antineutrophilic cytoplasmic antibody (ANCA) vasculitis: Secondary | ICD-10-CM | POA: Diagnosis not present

## 2024-06-29 DIAGNOSIS — I517 Cardiomegaly: Secondary | ICD-10-CM | POA: Diagnosis not present

## 2024-06-29 DIAGNOSIS — R220 Localized swelling, mass and lump, head: Secondary | ICD-10-CM | POA: Diagnosis not present

## 2024-06-29 DIAGNOSIS — I770 Arteriovenous fistula, acquired: Secondary | ICD-10-CM | POA: Diagnosis not present

## 2024-06-29 DIAGNOSIS — I480 Paroxysmal atrial fibrillation: Secondary | ICD-10-CM | POA: Diagnosis not present

## 2024-06-29 DIAGNOSIS — R0602 Shortness of breath: Secondary | ICD-10-CM | POA: Diagnosis not present

## 2024-06-29 DIAGNOSIS — H05111 Granuloma of right orbit: Secondary | ICD-10-CM | POA: Diagnosis not present

## 2024-06-29 DIAGNOSIS — N189 Chronic kidney disease, unspecified: Secondary | ICD-10-CM | POA: Diagnosis not present

## 2024-06-29 DIAGNOSIS — Z992 Dependence on renal dialysis: Secondary | ICD-10-CM | POA: Diagnosis not present

## 2024-06-29 DIAGNOSIS — N186 End stage renal disease: Secondary | ICD-10-CM | POA: Diagnosis not present

## 2024-06-29 DIAGNOSIS — I5022 Chronic systolic (congestive) heart failure: Secondary | ICD-10-CM | POA: Diagnosis not present

## 2024-06-29 DIAGNOSIS — I120 Hypertensive chronic kidney disease with stage 5 chronic kidney disease or end stage renal disease: Secondary | ICD-10-CM | POA: Diagnosis not present

## 2024-06-29 DIAGNOSIS — I10 Essential (primary) hypertension: Secondary | ICD-10-CM | POA: Diagnosis not present

## 2024-06-29 DIAGNOSIS — I871 Compression of vein: Secondary | ICD-10-CM | POA: Diagnosis not present

## 2024-06-29 DIAGNOSIS — E785 Hyperlipidemia, unspecified: Secondary | ICD-10-CM | POA: Diagnosis not present

## 2024-06-29 NOTE — Discharge Summary (Signed)
 [facility] Medicine Discharge Summary   Demographics: James Nicholson  58 y.o. Mar 21, 1966 MRN: [id number]    Extended Emergency Contact Information Primary Emergency Contact: Bayne,Velljko Home Phone: [phone] Mobile Phone: [phone] Relation: Brother  Full Code  Admit Date: 06/29/2024                            Attending Physician: Vernal Anselm Alstrom, MD Discharge Date: 06/29/2024  Primary Care Provider: Zachary Conger, MD   [phone]  Consults during this admission: Consult Orders             IP CONSULT TO VASCULAR SURGERY       Specialty:  Vascular Surgery  Provider:  (Not yet assigned)      IP CONSULT TO HOSPITALIST       Provider:  (Not yet assigned)      IP CONSULT TO NEPHROLOGY       Specialty:  Nephrology  Provider:  (Not yet assigned)             Active & Resolved Diagnosis: Principal Problem:   Volume overload Active Problems:   Orbital mass   ANCA-associated vasculitis (HCC)   Granulomatosis with polyangiitis with renal involvement (HCC) Resolved Problems:   * No resolved hospital problems. *  Disposition: Patient discharged to Acute hospital facility in stable condition.    Scheduled Future Appointments       Provider Department Dept Phone Center   12/08/2024 8:30 AM [facility] RHEUM ADELITA LED FELLOW 1 [facility] - Rheumatology [place] [phone] [facility] [address] [facility]      Hospital Course: Deloris Stordahl is a 58 y.o. year old male with a PMH of end-stage renal disease on Tuesday Thursday Saturday hemodialysis, ANCA associated vasculitis on rituximab , recent right forearm fistula, hyperlipidemia, hypertension, history of cardiac arrest status post AICD presented to the hospital with shortness of breath and fullness in her neck and face for 1 day and neck discomfort..  Patient's brother at bedside interpreting for the patient.  Patient also complains of mild shortness of breath and congestion.  He also  complains of mild abdominal distention and had a history of fluid buildup in his abdomen.  This time denies any nausea, vomiting, abdominal pain, fever, chills or rigor.  Denies any urinary urgency frequency dysuria and makes some urine.  Denies any changes in his bowel habits or appetite.   ED course: In the ED vitals were stable.  Labs were notable for hemoglobin of 12.1, WBC 6.1.  BMP was notable for hyperkalemia with potassium of 5.2.  Creatinine was elevated at 14.2.  BNP elevated at 741.  CT soft tissue of the neck with some edema in the neck.  CT head scan was negative for acute intracranial abnormality.  Chest x-ray with interstitial pulmonary edema. Nephrology was consulted from the ED and patient was considered for admission to hospital for further evaluation and treatment   Assessment & Plan  Neck pain and swelling.  Status post right IJ tunneled catheter.  Subcutaneous edema noted with stenosis of the superior vena cava near the confluence of right azygous vein and stenosis of the left innominate vein.  Vascular surgery has been notified from the ED for consultation.  Patient transferring to [facility] in [place] for IR guided intervention of possible SVC stenosis.     Volume overload.  on Tuesday Thursday Saturday schedule.  Has completed dialysis on Saturday and is due  for dialysis today..  Nephrology has been consulted for  hemodialysis..  Patient does have status post  right forearm fistula and right chest wall hemodialysis catheter.   Dr. Sheryl was notified.   Right forearm fistula.  Recently seen at [facility].  Has not been used yet.   Mild hyperkalemia.  Can be corrected with dialysis.  On Lokelma  as outpatient.   Hyperlipidemia As per the medical records.  Will check fasting lipid profile   History of ANCA associated vasculitis.  Follows up with rheumatology as outpatient and is on Rituxan   every 6 months.  Follows up with Dr. Mercie at [facility].    Essential hypertension Blood pressure stable on presentation.  Medication reconciliation pending   History of cardiac arrest status post AICD No chest pain.  EKG showed right bundle branch block BNP elevated.  Continue dialysis for fluid management           Wound / Incision Assessment: Refer to Chart Review and Media Tab for images if available.     Vital Sign Range:  Temp:  [97.9 F (36.6 C)-98.3 F (36.8 C)] 97.9 F (36.6 C) Heart Rate:  [73-84] 76 Resp:  [12-18] 16 BP: (126-145)/(71-81) 136/71      Discharge Medications     Unreviewed Medications      Sig Disp Refill Start End  Auryxia  210 mg iron  Tab tablet Generic drug: ferric citrate   Take 210 mg by mouth in the morning and 210 mg at noon and 210 mg in the evening. Take with meals.   0     Lokelma  10 gram Pwpk packet Generic drug: sodium zirconium cyclosilicate   Take 1 packet by mouth.   0     sulfamethoxazole -trimethoprim  400-80 mg per tablet Commonly known as: BACTRIM   Take 1 tablet 3 days per week. If taken on dialysis days, administer after hemodialysis  36 tablet  0             Lab Results  Component Value Date/Time   HGB 12.1 (L) 06/29/2024 09:54 AM   HCT 35.8 (L) 06/29/2024 09:54 AM   WBC 6.16 06/29/2024 09:54 AM   PLT 173 06/29/2024 09:54 AM   Lab Results  Component Value Date/Time   NA 141 06/29/2024 09:54 AM   K 5.2 (H) 06/29/2024 09:54 AM   CREATININE 14.23 (H) 06/29/2024 09:54 AM   BUN 97 (H) 06/29/2024 09:54 AM   GLUCOSE 92 06/29/2024 09:54 AM    Pertinent Imaging: CT Chest W Contrast  Preliminary Result by Ozell Prentice Hamlet, MD (09/02 1818)  CT CHEST W CONTRAST, 06/29/2024 5:32 PM    INDICATION: neck/facial swelling with concern for SVC syndrome     COMPARISON: CT PE 10/27/2020    TECHNIQUE: Multislice axial images were obtained through the chest with   administration of iodinated intravenous contrast material. Multi-planar   reformatted images were generated  for additional analysis. Nongated   technique limits cardiac detail.    All CT scans at [facility] and [facility]   Imaging are performed using radiation dose optimization techniques as   appropriate to a performed exam, including but not limited to one or more   of the following: automatic exposure control, adjustment of the mA and/or   kV according to patient size, use of iterative reconstruction technique.   In addition, our institution participates in a radiation dose monitoring   program to optimize patient radiation exposure.    FINDINGS:  Tubes and Lines: Left subclavian approach cardiac rhythm maintenance   device with single lead terminating in the right ventricle. Right IJ   approach hemodialysis catheter with tip in the superior cavoatrial   junction.    Thoracic inlet/central airways: Visualized thyroid  gland is normal.    Central airway is patent.  No cervical lymphadenopathy.     Mediastinum/hila/axilla: No adenopathy. Small hiatal hernia.    Heart/vessels: Focal filling defect of the left subclavian vein with   minimal contrast traversing from the mid left subclavian vein beyond the   confluence with the internal jugular vein, to approximately midline.   Distal opacification of the confluence with the SVC. Numerous collateral   vessels. Apparent focal narrowing of the proximal SVC and distal   SVC/cavoatrial junction. Tortuous, enlarged right axillary and subclavian   veins. Mild four-chamber cardiomegaly. Calcification the left ventricle,   similar to prior. No pericardial effusion.  There is severe calcified   plaque in coronary arteries.     Aorta is normal in caliber. Moderate atherosclerotic calcification. No   central pulmonary embolism.     Lungs/pleura: No dominant or suspicious nodules, focal consolidation,   effusion, or pneumothorax.     Upper abdomen: No acute process in the visualized upper abdomen. Partially    visualized bilateral renal atrophy with numerous cysts.    Chest wall/MSK:  No acute osseous findings or aggressive osseous lesion.     IMPRESSION:    1.  Left subclavian vein filling defect which could reflect thrombosis   and/or stenosis, likely associated with indwelling cardiac rhythm   maintenance device.  2.  Apparent focal narrowing of the proximal and distal SVC, without   evidence of filling defect. This finding may relate to indwelling devices.    CT Head WO Contrast  Final Result by Waddell Norleen Ruts, MD [phone] 900209/[id number])  CT HEAD WITHOUT CONTRAST, 06/29/2024 12:59 PM    INDICATION: Headache    COMPARISON: CT face 10/31/2021.    TECHNIQUE: Axial CT images of the brain from skull base to vertex,   including portions of the face and sinuses, were obtained without   contrast. Supplemental 2D reformatted images were generated and reviewed   as needed.    All CT scans at [facility]   Imaging are performed using radiation dose optimization techniques as   appropriate to a performed exam, including but not limited to one or more   of the following: automatic exposure control, adjustment of the mA and/or   kV according to patient size, use of iterative reconstruction technique.   In addition, our institution participates in a radiation dose monitoring   program to optimize patient radiation exposure.    FINDINGS:  Calvarium/skull base: No evidence of acute fracture or destructive lesion.   Mastoids and middle ears demonstrate no substantial mucosal disease.   Similar bulk of amorphous soft tissue along the floor and medial aspect of   the right orbit which involves the inferior and medial rectus musculature.    Paranasal sinuses: Mucosal thickening within the left frontal sinus with   thickening of the walls of the sinus, suggestive of chronic sinusitis. No   air-fluid levels within the visualized paranasal sinuses. Similar osseous    thinning along the anterior aspect of the right medial orbital wall.   Surgical changes to the paranasal sinuses on the right, partially imaged.   Mucosal thickening in the right maxillary sinus which has decreased from  prior.    Brain: No acute large vascular territory infarct. No mass effect. Brain   parenchymal volume loss with ex vacuo enlargement of the ventricular   system. No acute hemorrhage. Patchy regions of hypoattenuation within the   periventricular and subcortical white matter most likely reflecting   sequela chronic small vessel ischemia. Intracranial arterial   calcifications.    IMPRESSION:  1.  No acute intracranial abnormality. MRI could provide further   evaluation if there is continued clinical concern.  2.  Similar bulk of amorphous soft tissue along the floor and medial   aspect of the right orbit, better evaluated on the previous dedicated CT   face.  3.  Surgical changes to the paranasal sinuses on the right, with areas of   mucosal thickening in the right maxillary sinus which have improved from   prior. Sequelae of chronic left frontal sinusitis.  4.  Background of chronic microvascular ischemic changes and brain   parenchymal volume loss.    CT Soft Tissue Neck W Contrast  Final Result by Waddell Norleen Ruts, MD (09/02 1434)  CT SOFT TISSUE NECK WITH CONTRAST, 06/29/2024 12:58 PM    INDICATION: Neck and facial swelling with dual-lumen catheter on right   chest wall. Need to rule out SVC syndrome.    COMPARISON: CT chest 10/27/2020. CT face 10/31/2021.    TECHNIQUE: Axial CT images from the skull base to the upper chest were   obtained after intravenous administration of iodine -based contrast for the   primary purpose of evaluating the soft tissues of the neck. Portions of   the brain, face, and cervical spine were also included in the imaging   field. Supplemental 2D reformatted images were generated and reviewed as   needed.    All CT scans at [facility] and [facility]   Imaging are performed using radiation dose optimization techniques as   appropriate to a performed exam, including but not limited to one or more   of the following: automatic exposure control, adjustment of the mA and/or   kV according to patient size, use of iterative reconstruction technique.   In addition, our institution participates in a radiation dose monitoring   program to optimize patient radiation exposure.    FINDINGS:  Superficial soft tissues: Mild stranding within the subcutaneous fat of   the upper back and posterior neck, possibly representing mild edema. There   is also small amount of fat stranding surrounding the tunnel portions of   the right IJ approach central venous catheter and involving the bilateral   upper chest and base of the neck.    Aerodigestive tract: Within technical constraints imposed by motion   artifact, no convincing evidence of mass or abnormal enhancement. The   airway is patent. Mild thickening of the bilateral aryepiglottic folds.    Salivary glands: Normal appearance of the parotid and submandibular   glands.    Thyroid  gland: Nonspecific coarse calcification within the inferior right   thyroid  lobe.    Lymph nodes: No enlarged or morphologically suspicious lymph nodes in the   neck.    Cervical vascular structures: Scattered atherosclerotic changes involving   the aortic arch and proximal arch vessels. Atherosclerotic changes   involving the carotid bifurcations without definite flow-limiting   stenosis. Motion artifact limits assessment of the cervical internal   carotid arteries. Atherosclerotic changes involving the carotid siphons.   Atherosclerotic calcifications involving the intradural vertebral   arteries. The internal  jugular veins appear grossly patent. There is a   right IJ tunneled central venous catheter which can be followed to the   level of the upper SVC. There is  mild narrowing of the right internal   jugular vein near the confluence of the right brachiocephalic vein. There   is also narrowing of the superior vena cava near its confluence with the   enlarged azygos vein. The visualized SVC otherwise appears patent. Left   subclavian approach cardiac rhythm maintenance device which can be   followed to the upper SVC. There is an area of stenosis of the left   innominate vein as   it crosses the brachiocephalic artery.    Imaged brain, skull base, and face: Grossly similar bulk of amorphous soft   tissue within the inferior aspect of the right orbit which engulfs the   inferior and medial rectus musculature. Surgical changes to the right   paranasal sinuses. Mucosal thickening in the right maxillary sinus which   is decreased from prior comparison CT face. The intracranial structures   are better evaluated on contemporaneous CT head. Scattered dental caries   and periapical lucencies.    Imaged lung apices: Interlobular septal thickening at the lung apices   which could reflect an element of interstitial pulmonary edema. Biapical   pleural/parenchymal scarring. Scattered areas of mosaic attenuation within   the long switch could reflect areas of air trapping.    Bones: No destructive osseous lesions. Multilevel degenerative changes of   the visualized spine. Moderate to severe degenerative disc changes at   C5-C6 with dorsal disc osteophyte complex contributing to at least   moderate canal stenosis.    IMPRESSION:  1.  Right IJ approach tunneled central venous catheter and left subclavian   approach cardiac rhythm maintenance device in place. There are areas of   narrowing of the distal right internal jugular vein near right innominate   vein, an area of stenosis of the superior vena cava near the confluence   with the enlarged right azygos vein, and an area of stenosis of the left   innominate vein. Multiple prominent venous structures with  refluxed   contrast are seen within the left eccentric posterior neck. Additionally,   there are areas of subcutaneous edema within the upper back and posterior   neck, surrounding the tunneled portions of the right IJ central venous   catheter, within the upper chest wall and bilateral base of the neck. The   SVC is incompletely imaged, and could be further assessed with dedicated   CT chest if clinically indicated. Correlate with evidence of central   venous stenosis which could serve as a cause for the above-described   subcutaneous edema. Additionally, correlate with direct   inspection of the right IJ central venous catheter to exclude superimposed   infectious or inflammatory change.  2.  There is also apparent mild thickening of the bilateral aryepiglottic   folds. Correlate with evidence of associated edema.  3.  Additional findings as above.    XR Chest 2 Views  Final Result by Simpson Ronold Rising, MD (09/02 1014)  XR CHEST 2 VIEWS, 06/29/2024 10:06 AM    INDICATION: Shortness of Breath   COMPARISON: CT chest on 10/27/2020 and x-ray chest on 10/04/2020.    FINDINGS:     Cardiovascular: Cardiomegaly. Right IJ approach Vas-Cath with tip   projecting over superior cavoatrial junction. Left subclavian approach   cardiac rhythm maintenance device with leads projecting over right  ventricle.  Mediastinum: Within normal limits.  Lungs/pleura: Bilateral perihilar and basilar linear interstitial   opacities. No large pleural effusion. No discernible pneumothorax.  Upper abdomen: Visualized portions are unremarkable.   Chest wall/osseous structures: Unremarkable.      IMPRESSION:  1.  Cardiomegaly with interstitial pulmonary edema. No large pleural   effusion.  2.  Right basilar streaky opacities which could represents subsegmental   atelectasis.      Electronically signed by: Fonda Agent Penninger, PA-C 06/29/2024 9:53 PM   No charge.

## 2024-06-30 DIAGNOSIS — I480 Paroxysmal atrial fibrillation: Secondary | ICD-10-CM | POA: Diagnosis not present

## 2024-06-30 DIAGNOSIS — I132 Hypertensive heart and chronic kidney disease with heart failure and with stage 5 chronic kidney disease, or end stage renal disease: Secondary | ICD-10-CM | POA: Diagnosis not present

## 2024-06-30 DIAGNOSIS — N186 End stage renal disease: Secondary | ICD-10-CM | POA: Diagnosis not present

## 2024-06-30 DIAGNOSIS — I7782 Antineutrophilic cytoplasmic antibody (ANCA) vasculitis: Secondary | ICD-10-CM | POA: Diagnosis not present

## 2024-06-30 DIAGNOSIS — E785 Hyperlipidemia, unspecified: Secondary | ICD-10-CM | POA: Diagnosis not present

## 2024-06-30 DIAGNOSIS — Z8674 Personal history of sudden cardiac arrest: Secondary | ICD-10-CM | POA: Diagnosis not present

## 2024-06-30 DIAGNOSIS — E875 Hyperkalemia: Secondary | ICD-10-CM | POA: Diagnosis not present

## 2024-06-30 DIAGNOSIS — I871 Compression of vein: Secondary | ICD-10-CM | POA: Diagnosis not present

## 2024-06-30 DIAGNOSIS — Z95810 Presence of automatic (implantable) cardiac defibrillator: Secondary | ICD-10-CM | POA: Diagnosis not present

## 2024-06-30 DIAGNOSIS — H05111 Granuloma of right orbit: Secondary | ICD-10-CM | POA: Diagnosis not present

## 2024-06-30 DIAGNOSIS — Z992 Dependence on renal dialysis: Secondary | ICD-10-CM | POA: Diagnosis not present

## 2024-06-30 DIAGNOSIS — I429 Cardiomyopathy, unspecified: Secondary | ICD-10-CM | POA: Diagnosis not present

## 2024-06-30 DIAGNOSIS — D631 Anemia in chronic kidney disease: Secondary | ICD-10-CM | POA: Diagnosis not present

## 2024-06-30 DIAGNOSIS — I5022 Chronic systolic (congestive) heart failure: Secondary | ICD-10-CM | POA: Diagnosis not present

## 2024-06-30 DIAGNOSIS — M3131 Wegener's granulomatosis with renal involvement: Secondary | ICD-10-CM | POA: Diagnosis not present

## 2024-06-30 NOTE — Consults (Signed)
 ------------------------------------------------------------------------------- Attestation signed by Redell Elsie Dural, MD at 07/01/2024  7:22 AM I reviewed the patient's chart and agree with the documentation in the resident note.  Redell MICAEL Dural, MD, FACS Otolaryngology/Facial Plastic Surgery  Electronically Signed by: Redell LELON Dural, MD, Attending Physician 07/01/2024 7:22 AM  -------------------------------------------------------------------------------  Otolaryngology Consultation Note  Referring Physician: Dr. Charlie Czar, MD Primary Care Physician: Zachary Conger, MD Patient Location at Initial Consult: Inpatient Chief Complaint/Reason for Consult: Neck swelling, voice changes  History of Presenting Illness:  History obtained from Electronic medical record / chart review, the patient, and the patient's brother (served as interpreter for [place] dialect).  James Nicholson is a 58 y.o. 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  ENT was consulted for voice changes.  Prior medical record, brother over the phone, and patient, patient initially presented to outside hospital for face/neck swelling that began about 3 days prior to arrival just after a dialysis session.  He noted neck pain and swelling around line site on right neck.  He also reported bilateral arm swelling.  Patient notably underwent right AVF revision on 06/11/2024 with vascular surgery at [facility] [place].  He also underwent right IJ PC placement at that time.  Outside hospital workup with CT imaging initially showed concern for SVC syndrome.  Patient did not undergo dialysis at outside hospital due to concern for catheter associated thrombus in the setting of SVC syndrome.   Per primary team, there is minimal concern for clot in SVC from nephrology or interventional radiology, and stenting would not be indicated.  Due to recent vocal changes, ENT consult was recommended.  Patient reports hoarseness of his voice that began yesterday.  He also notes some discomfort with swallowing.  He denies any trouble breathing or respiratory distress.  No secretion intolerance.  No specific dysphagia to solids or liquids.  No recent weight changes, fevers, or other illness.  He is able to lie flat without discomfort.  Current respiratory support: room air.  Of note, patient's brother acted as an interpreter due to language services not having the correct [place] dialect.  Patient stated understanding and all questions were answered.  Review of Systems: As noted in HPI.   Past Medical History: As noted in HPI.  Past Surgical History: Patient denies prior head/neck surgeries, though prior sinus surgery noted on CT scan.  Family History: No history of bleeding disorders or difficulty with anesthesia.  Social History: Tobacco: Former smoker.  Quit in 2010. Alcohol: No alcohol use. Medical History[1] Surgical History[2] Family History[3] Social History[4] Medications Ordered Prior to Encounter[5] Allergies[6]   OBJECTIVE: Vitals and labs reviewed.   Physical Exam General: -No acute distress. Well developed, well nourished.  -Verbal with a strong voice.  No significant hoarseness.   Head/Face: -Muscles of facial expression mobility full and equal bilaterally -Sensation symmetric and full in V1-V3 distribution bilaterally - No significant facial edema.   Eyes: -PERRL - Eyes dilated from ophthalmology exam.   Ears: -Left ear: normal auricle. Normal external canal.   -Right ear: normal auricle. Normal external canal.    Nose: -No gross deformity, purulent discharge, or epistaxis.    Mouth/Oropharynx: -Lips without lesions - Poor dentition -No trismus -No  mucosal lesions, tonsillar  enlargement, exudate, or lesions -Pharyngeal walls symmetrical. Uvula midline -Tongue midline without lesions. Full ROM -Floor of mouth soft   Neck: -Trachea midline. -No crepitus or masses - No significant edema of neck. - Mild erythema around the line, as pictured below.    Respiratory: - No stridor.  Breathing comfortably on room air.  Able to lie flat.   Cardiovascular: -Hemodynamically stable   Neurologic: -Moving extremities without gross abnormality     Review of Ancillary Data / Diagnostic Tests: I have personally reviewed the following imaging: - CT neck: Possible mild fullness of AE folds   ASSESSMENT: James Nicholson is a 58 y.o. male admitted due to concern for SVC syndrome, with reported recent voice changes for which ENT was consulted. On exam, there is no stridor and patient is breathing comfortably on room air. Transnasal fiberoptic laryngoscopy performed at bedside was reassuring and demonstrated a normal, patent airway.  No drainable collection in neck or mass for ENT intervention.  RECOMMENDATIONS: -Transnasal fiberoptic laryngoscopy performed at bedside. See separate Procedure note.  -SLP eval for trouble swallowing.  Reengage ENT PRN pending speech assessment. -Dentistry consult/referral outpatient. -Defer remainder of care and additional workup/management per primary team, IR, and vascular. -Please reach out to ENT if patient develops any airway concerns or changes.  Patient seen with Tinnie Emerald, MD, PGY4 and discussed with Dr. Bethel, attending on call  Thank you for including us  in the care of this patient.  For questions or concerns, please secure chat message the appropriate ENT Team listed under Care Providers. Please make sure to leave a callback number.  Electronically signed by: Johana Maryelizabeth Cable, MD 06/30/2024 6:03 PM          [1] Past Medical History: Diagnosis Date  . Cardiomyopathy    (CMD)    s/p ICD  .  ESRD (end stage renal disease) on dialysis    (CMD) 2014  . Granulomatosis with polyangiitis with renal involvement    (CMD)   . HLD (hyperlipidemia)   . HTN (hypertension)   . Hx of cardiac arrest 2010   VT arrest in [place]   . ICD (implantable cardioverter-defibrillator) in place   . TB lung, latent    tx in 2013  [2] Past Surgical History: Procedure Laterality Date  . ORBITOTOMY Right 05/14/2020   Procedure: ORBITOTOMY ANTERIOR, TRANSCONJUNCTIVAL BIOPSY;  Surgeon: Carlin Alm Ester, MD;  Location: [facility] OR;  Service: Ophthalmology;  Laterality: Right;  [3] No family history on file. [4] Social History Socioeconomic History  . Marital status: Single  Tobacco Use  . Smoking status: Never  . Smokeless tobacco: Never  Social History Narrative   ** Merged History Encounter **       Social Drivers of Health   Food Insecurity: No Food Insecurity (05/14/2020)   Received from [facility] visits prior to 12/28/2022.   Food vital sign   . Within the past 12 months, you worried that your food would run out before you got money to buy more: Never true   . Within the past 12 months, the food you bought just didn't last and you didn't have money to get more: Never true   Received from [facility]   HITS  [5] Current Facility-Administered Medications on File Prior to Encounter  Medication Dose Route Frequency Provider Last Rate Last Admin  . [DISCONTINUED] acetaminophen  (TYLENOL ) tablet 650 mg  650 mg oral Q6H PRN Vernal Anselm Alstrom, MD   650 mg at 06/29/24 2118  . [  DISCONTINUED] dextrose  (D50W) 50 % injection 12.5 g  12.5 g intravenous PRN Vernal Anselm Alstrom, MD      . [DISCONTINUED] dextrose  (GLUTOSE) 40 % oral gel 15 g  15 g oral PRN Laxman Raj Pokhrel, MD      . [DISCONTINUED] heparin  (porcine) 5,000 unit/mL injection 5,000 Units  5,000 Units subcutaneous Q8H SCH Laxman Anselm Alstrom, MD   5,000 Units at 06/29/24 2117  . [DISCONTINUED] ondansetron   (ZOFRAN -ODT) disintegrating tablet 4 mg  4 mg oral Q6H PRN Vernal Anselm Alstrom, MD      . [DISCONTINUED] senna (SENOKOT) tablet 8.6 mg  1 tablet oral Daily Laxman Raj Pokhrel, MD   8.6 mg at 06/29/24 1713   Current Outpatient Medications on File Prior to Encounter  Medication Sig Dispense Refill  . ferric citrate  (Auryxia ) 210 mg iron  tab tablet Take 210 mg by mouth in the morning and 210 mg at noon and 210 mg in the evening. Take with meals.    . sodium zirconium cyclosilicate  (Lokelma ) 10 g pwpk packet Take 1 packet by mouth.    . sulfamethoxazole -trimethoprim  (BACTRIM ) 400-80 mg per tablet Take 1 tablet 3 days per week. If taken on dialysis days, administer after hemodialysis (Patient not taking: No sig reported) 36 tablet 0  [6] No Known Allergies

## 2024-06-30 NOTE — Procedures (Signed)
-------------------------------------------------------------------------------   Attestation signed by Redell Elsie Dural, MD at 07/01/2024  7:21 AM I provided general supervision for this procedure.  Redell MICAEL Dural, MD, FACS Otolaryngology/Facial Plastic Surgery  Electronically Signed by: Redell LELON Dural, MD, Attending Physician 07/01/2024 7:21 AM  -------------------------------------------------------------------------------  Procedure: Transnasal Fiberoptic Laryngoscopy Preoperative Diagnosis: Voice changes Postoperative Diagnosis: Same Physician: Johana Maryelizabeth Cable, MD Complications: None.   Description of Procedure:  With the patient in the sitting position, the scope was passed through the nose. Examination was carried out of the nose, nasopharynx, oropharynx, hypopharynx, and larynx with findings as noted above. Scope was removed. The patient tolerated the procedure well.   Findings: The left nasal cavity and nasopharynx are unremarkable. The tongue base, pharyngeal walls, piriform sinuses, vallecula, epiglottis and postcricoid region are normal in appearance. The visualized portion of the subglottis and proximal trachea is widely patent. The vocal folds are  mobile bilaterally with appropriate glottal closure as limited by lack of stroboscopy. There are no lesions on the free edge of the vocal folds nor elsewhere in the larynx worrisome for malignancy.  Electronically signed by: Johana Maryelizabeth Cable, MD 06/30/2024 6:46 PM

## 2024-07-01 DIAGNOSIS — D631 Anemia in chronic kidney disease: Secondary | ICD-10-CM | POA: Diagnosis not present

## 2024-07-01 DIAGNOSIS — I132 Hypertensive heart and chronic kidney disease with heart failure and with stage 5 chronic kidney disease, or end stage renal disease: Secondary | ICD-10-CM | POA: Diagnosis not present

## 2024-07-01 DIAGNOSIS — Z8674 Personal history of sudden cardiac arrest: Secondary | ICD-10-CM | POA: Diagnosis not present

## 2024-07-01 DIAGNOSIS — E875 Hyperkalemia: Secondary | ICD-10-CM | POA: Diagnosis not present

## 2024-07-01 DIAGNOSIS — I480 Paroxysmal atrial fibrillation: Secondary | ICD-10-CM | POA: Diagnosis not present

## 2024-07-01 DIAGNOSIS — I7782 Antineutrophilic cytoplasmic antibody (ANCA) vasculitis: Secondary | ICD-10-CM | POA: Diagnosis not present

## 2024-07-01 DIAGNOSIS — Z992 Dependence on renal dialysis: Secondary | ICD-10-CM | POA: Diagnosis not present

## 2024-07-01 DIAGNOSIS — I5022 Chronic systolic (congestive) heart failure: Secondary | ICD-10-CM | POA: Diagnosis not present

## 2024-07-01 DIAGNOSIS — M3131 Wegener's granulomatosis with renal involvement: Secondary | ICD-10-CM | POA: Diagnosis not present

## 2024-07-01 DIAGNOSIS — E785 Hyperlipidemia, unspecified: Secondary | ICD-10-CM | POA: Diagnosis not present

## 2024-07-01 DIAGNOSIS — I871 Compression of vein: Secondary | ICD-10-CM | POA: Diagnosis not present

## 2024-07-01 DIAGNOSIS — I429 Cardiomyopathy, unspecified: Secondary | ICD-10-CM | POA: Diagnosis not present

## 2024-07-01 DIAGNOSIS — Z95810 Presence of automatic (implantable) cardiac defibrillator: Secondary | ICD-10-CM | POA: Diagnosis not present

## 2024-07-01 DIAGNOSIS — N186 End stage renal disease: Secondary | ICD-10-CM | POA: Diagnosis not present

## 2024-07-01 DIAGNOSIS — H05111 Granuloma of right orbit: Secondary | ICD-10-CM | POA: Diagnosis not present

## 2024-07-02 DIAGNOSIS — I7782 Antineutrophilic cytoplasmic antibody (ANCA) vasculitis: Secondary | ICD-10-CM | POA: Diagnosis not present

## 2024-07-02 DIAGNOSIS — I480 Paroxysmal atrial fibrillation: Secondary | ICD-10-CM | POA: Diagnosis not present

## 2024-07-02 DIAGNOSIS — Z992 Dependence on renal dialysis: Secondary | ICD-10-CM | POA: Diagnosis not present

## 2024-07-02 DIAGNOSIS — N186 End stage renal disease: Secondary | ICD-10-CM | POA: Diagnosis not present

## 2024-07-02 DIAGNOSIS — M3131 Wegener's granulomatosis with renal involvement: Secondary | ICD-10-CM | POA: Diagnosis not present

## 2024-07-02 DIAGNOSIS — E875 Hyperkalemia: Secondary | ICD-10-CM | POA: Diagnosis not present

## 2024-07-02 DIAGNOSIS — H05111 Granuloma of right orbit: Secondary | ICD-10-CM | POA: Diagnosis not present

## 2024-07-02 DIAGNOSIS — I132 Hypertensive heart and chronic kidney disease with heart failure and with stage 5 chronic kidney disease, or end stage renal disease: Secondary | ICD-10-CM | POA: Diagnosis not present

## 2024-07-02 DIAGNOSIS — D631 Anemia in chronic kidney disease: Secondary | ICD-10-CM | POA: Diagnosis not present

## 2024-07-02 DIAGNOSIS — E785 Hyperlipidemia, unspecified: Secondary | ICD-10-CM | POA: Diagnosis not present

## 2024-07-02 DIAGNOSIS — I871 Compression of vein: Secondary | ICD-10-CM | POA: Diagnosis not present

## 2024-07-02 DIAGNOSIS — I5022 Chronic systolic (congestive) heart failure: Secondary | ICD-10-CM | POA: Diagnosis not present

## 2024-07-02 DIAGNOSIS — I429 Cardiomyopathy, unspecified: Secondary | ICD-10-CM | POA: Diagnosis not present

## 2024-07-02 DIAGNOSIS — Z95810 Presence of automatic (implantable) cardiac defibrillator: Secondary | ICD-10-CM | POA: Diagnosis not present

## 2024-07-02 DIAGNOSIS — Z8674 Personal history of sudden cardiac arrest: Secondary | ICD-10-CM | POA: Diagnosis not present

## 2024-07-02 NOTE — Group Note (Signed)
 Inpatient Care Coordination Team Conference Note  07/02/2024   Time:3:56 PM   CSN: [id number]  DOB: 04/29/1966   Room/Bed: AT52/A LOS: 2 Payor Info: Payor: UHC MEDICARE ADV / Plan: UHC MA DUAL COMPLETE RPPO-SNP / Product Type: Medicare Advantage /    Admitting Diagnosis: SVC syndrome [I87.1]  Admit Date/Time: 06/30/2024  2:14 AM Admission Comments: No comment available   Primary Diagnosis: SVC syndrome Principal Problem: SVC syndrome  Predictive Model Details        12.5% (Medium)  Factor Value   Calculated 07/02/2024 13:41 10% Latest creatinine in last 72 hrs 8.15 mg/dL   Readmission Risk Score v2 Model 9% Braden score 23    9% Number of active outpatient medication orders 3    8% Diagnosis of fluid or electrolyte disorder present    8% Latest BUN in last 72 hrs 29 mg/dL     Team Members Present: Case Manager, Nurse, Provider  Expected Discharge Date: Jul 02, 2024  April Landy Kleine, RN [phone]

## 2024-07-02 NOTE — Discharge Summary (Signed)
 Hospital Medicine Discharge Summary   Demographics: James Nicholson  58 y.o. 20-Feb-1966 MRN: [id number]    Extended Emergency Contact Information Primary Emergency Contact: Scroggs,Velljko Home Phone: [phone] Mobile Phone: [phone] Relation: Brother  Full Code  Admit Date: 06/30/2024                            Attending Physician: Charlie Czar, MD Discharge Date: 07/02/2024  Primary Care Provider: Zachary Conger, MD   [phone]  Consults during this admission: Consult Orders             IP CONSULT TO ENT       Specialty:  Otolaryngology  Provider:  (Not yet assigned)      IP CONSULT TO OPHTHALMOLOGY       Specialty:  Ophthalmology  Provider:  (Not yet assigned)      IP CONSULT TO NEPHROLOGY       Specialty:  Nephrology  Provider:  (Not yet assigned)      IP CONSULT TO INTERVENTIONAL RADIOLOGY       Provider:  (Not yet assigned)             Active & Resolved Diagnosis: Principal Problem:   SVC syndrome Active Problems:   Granulomatosis with polyangiitis with renal, pulmonary, and orbit involvement(HCC)   ESRD on dialysis via R permCath    (CMD)   HLD (hyperlipidemia)   Primary hypertension   Granulomatosis with polyangiitis of orbit    (CMD)   History of cardiac arrest / VT arrest s/p ACID   Chronic heart failure with reduced ejection fraction (HFrEF, <= 40%)    (CMD)   Paroxysmal atrial fibrillation    (CMD)   History of TB (tuberculosis)   Recently created R AVF   Hyperkalemia Resolved Problems:   * No resolved hospital problems. *  Disposition: Patient discharged to Home in stable condition.  Discharge follow-up recommendations : Other: follow up with vascular surgery on the management of AV fistula  Scheduled Future Appointments       Provider Department Dept Phone Center   12/08/2024 8:30 AM [facility] RHEUM CLEMM THAPA FELLOW 1 [facility] - Rhe[facility] [place] [phone] [facility] [address]      Hospital  Course: [name] is a 58 y.o. male with history of ESRD 2/2 ANCA vasculitis on HD,  pANCA GPA + GN with orbital granuloma w/ proptosis (dx 2019, on cytoxan ), Hx Cardiac/VT arrest (2010) and HFrEF s/p AICD, Afib (not on AC), HTN, HLD, Latent TB (s/p tx in 2013), who presented from OSH due to concern for SVC syndrome   Patient presented to OSH due to fullness in her face and neck that started Saturday. Symptomatic associated with bilateral arm swelling. Symptoms is associated minimal SOB, abdominal swelling. But denies any leg edema. Denies any fever, chills, N/V, abdominal pain, CP, dysuria, diarrhea.    Per chart review, patient was seen by vascular surgery and underwent R IJ permcath placement and R AVF revision on 06/11/24   OSH work up including CT head, CT neck, CT chest, concern for SVC syndrome.  OSH consulted nephrology and vascular surgery. OSH Vascular recommended transfer to [facility] for further evaluation. Nephrology was also consulted and recommended hold off HD due to concern for catheter associated thrombus in setting of SVC syndrome. Thus, last HD was Saturday and did not receive any HD on Tuesday. Patient was only on subcutaneous heparin , anticoagulation was not started.  After evaluation by interventional radiology as well as by ENT, it was determined that SVC syndrome is not a concern.  Permacatheter was actually able to be used for dialysis during the patient's hospital stay. Assessment & Plan SVC syndrome Presented to OSH due to neck and face fullness and SOB OSH CT neck: showed  narrowing of distal right IJ and SVC, with enlarged R azogyos veins, reflux contrast of left posterior neck veins,, bilateral aryepiglottic fold thickening/edema, subcutaneous edema within the upper back and posterior neck, surrounding the tunneled portions of the right IJ central venous catheter.  OSH CT chest: Left subclavian vein filling defect which could reflect thrombosis and/or stenosis, likely  associated with indwelling cardiac rhythm maintenance device. 2.  Apparent focal narrowing of the proximal and distal SVC, without evidence of filling defect. This finding may relate to indwelling devices. Overall, symptoms and imaging finding correlate with SVC syndrome which could be related to ICD leads vs. Permcath.  Plan: --Vascular consulted and expressed little concern for SVC syndrome. - IR consulted and they also indicated a concern for SVC is low -IR recommended ENT evaluation, laryngoscopy was performed on 9/3 and this also came back normal without clear cause of the patient's neck swelling. - Dialysis was given via permacatheter. - Permacatheter will be pulled as soon as AV fistula is ready, after 9/12  ESRD on dialysis via R permCath    (CMD) Recently created R AVF - ESRD 2/2 ANCA on HD TTS - Has R AVF revision on 8/15, staples in place, removal on 9/12 - Last HD was Saturday, did not receive HD on Tuesday due to concern for catheter associated thrombus in SVC syndrome per OSH nephrology Plan: - Nephrology consulted.  S/p dialysis on 9/3 and will be repeated on 9/4 - Daily CBC, BMP, Mg, Phos - No lovenox , NSAIDS, morphine , Fleet's enema, regular insulin . Judicious use of IV contrast - No midlines or PICCS to preserve future dialysis access - Strict I&Os, daily standing weights if able - dialysis (low K, low phos) diet - 1500cc daily fluid restriction      -, Patient declined additional medications including Hectorol , Cinacalcet , Revnela.  He would like to discuss this with his primary nephrologist.  He is concerned about overmedication                       Granulomatosis with polyangiitis with renal, pulmonary, and orbit involvement(HCC) - Follows up with rheumatology as outpatient and is on Rituxan  every 6 months.  -Consulted ophthalmology per patient's request History of cardiac arrest / VT arrest s/p ACID Chronic heart failure with reduced ejection fraction (HFrEF, <= 40%)     (CMD) - not in acute exacerbation - not on GDMT Paroxysmal atrial fibrillation    (CMD) - not on AC Primary hypertension - not on BP meds HLD (hyperlipidemia) - not on statin History of TB (tuberculosis) S/p treatment in 2013 Hyperkalemia Potassium  Date/Time Value Ref Range Status  07/02/2024 08:29 AM 4.2 3.5 - 5.1 mmol/L Final    Comment:    NO VISIBLE HEMOLYSIS  07/01/2024 04:31 AM 5.0 3.5 - 5.1 mmol/L Final    Comment:    NO VISIBLE HEMOLYSIS  06/30/2024 02:28 PM 4.7 3.5 - 5.1 mmol/L Final    Comment:    NO VISIBLE HEMOLYSIS  Improved. - Monitor with daily BMPs  09/03 1844 Note By: Johana Maryelizabeth Cable, MD      Wound / Incision Assessment: Refer to Chart Review and  Media Tab for images if available.     Vital Sign Range:  Temp:  [97.5 F (36.4 C)-98.6 F (37 C)] 97.9 F (36.6 C) Heart Rate:  [73-110] 83 Resp:  [13-28] 18 BP: (109-158)/(63-90) 127/80  Physical Exam: General: Alert, oriented X 3, resting comfortably in no acute distress  Head-Eyes-Ears-Nose-Throat: EOMI, oropharynx clear, moist mucous membranes, hearing intact  Cardiovascular: Regular rate and rhythm, no murmurs. Pulses 2+ in all extremities, no LE edema.  Chest/Lungs: No increased work of breathing, lungs clear bilaterally no wheezing.  Abdomen: Positive bowel sounds. Abdomen soft, nondistended, nontender.   MSK: No obviously joint deformities or swelling   Skin: Right forearm AV fistula revision site appears clean and dry.  Staples still in place.  Neuro: Awake, alert, spontaneously moves all extremities, strength intact  Psych: Appropriate mood and affect, conversational and cooperative  Heme: No significant bruising or bleeding      Discharge Medications     Medications To Continue      Sig Disp Refill Start End  Auryxia  210 mg iron  Tab tablet Generic drug: ferric citrate   Take 210 mg by mouth in the morning and 210 mg at noon and 210 mg in the evening. Take with meals.   0      Lokelma  10 gram Pwpk packet Generic drug: sodium zirconium cyclosilicate   Take 1 packet by mouth.   0         Stopped Medications    sulfamethoxazole -trimethoprim  400-80 mg per tablet Commonly known as: BACTRIM          Physical Therapy Recommendations: No skilled PT needs at discharge     Lab Results  Component Value Date/Time   HGB 13.1 (L) 07/02/2024 06:50 AM   HCT 37.6 (L) 07/02/2024 06:50 AM   WBC 5.00 07/02/2024 06:50 AM   PLT 153 07/02/2024 06:50 AM   Lab Results  Component Value Date/Time   NA 135 (L) 07/02/2024 08:29 AM   K 4.2 07/02/2024 08:29 AM   CREATININE 8.15 (H) 07/02/2024 08:29 AM   BUN 29 (H) 07/02/2024 08:29 AM   GLUCOSE 128 (H) 07/02/2024 08:29 AM    Pertinent Imaging: No orders to display    Electronically signed by: Charlie Czar, MD 07/02/2024 11:02 AM   Time spent on discharge: 35 minutes

## 2024-07-03 DIAGNOSIS — N2581 Secondary hyperparathyroidism of renal origin: Secondary | ICD-10-CM | POA: Diagnosis not present

## 2024-07-03 DIAGNOSIS — D689 Coagulation defect, unspecified: Secondary | ICD-10-CM | POA: Diagnosis not present

## 2024-07-03 DIAGNOSIS — D631 Anemia in chronic kidney disease: Secondary | ICD-10-CM | POA: Diagnosis not present

## 2024-07-03 DIAGNOSIS — E875 Hyperkalemia: Secondary | ICD-10-CM | POA: Diagnosis not present

## 2024-07-03 DIAGNOSIS — I499 Cardiac arrhythmia, unspecified: Secondary | ICD-10-CM | POA: Diagnosis not present

## 2024-07-03 DIAGNOSIS — Z992 Dependence on renal dialysis: Secondary | ICD-10-CM | POA: Diagnosis not present

## 2024-07-03 DIAGNOSIS — N186 End stage renal disease: Secondary | ICD-10-CM | POA: Diagnosis not present

## 2024-07-05 ENCOUNTER — Telehealth: Payer: Self-pay

## 2024-07-05 NOTE — Transitions of Care (Post Inpatient/ED Visit) (Signed)
   07/05/2024  Name: James Nicholson MRN: [id number] DOB: 08-11-1966  Today's TOC FU Call Status: Today's TOC FU Call Status:: Successful TOC FU Call Completed TOC FU Call Complete Date: 07/05/24 Patient's Name and Date of Birth confirmed.  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 How have you been since you were released from the hospital?: Better Any questions or concerns?: No  Items Reviewed: Did you receive and understand the discharge instructions provided?: Yes Medications obtained,verified, and reconciled?: Yes (Medications Reviewed) Any new allergies since your discharge?: No Dietary orders reviewed?: Yes Type of Diet Ordered:: Low sodium Heart Healthy Do you have support at home?: Yes People in Home [RPT]: sibling(s) Name of Support/Comfort Primary Source: James Nicholson  Medications Reviewed Today: Medications Reviewed Today     Reviewed by James Reusing, RN (Case Manager) on 07/05/24 at 1445  Med List Status: <None>   Medication Order Taking? Sig Documenting Provider Last Dose Status Informant  AURYXIA  1 GM 210 MG(Fe) tablet [id number]  Take 630 mg by mouth 3 (three) times daily with meals. [provider]  Active Family Member  b complex-vitamin c-folic acid  (NEPHRO-VITE) 0.8 MG TABS tablet [id number]  Take 1 tablet by mouth daily. [provider]  Active Family Member  HYDROcodone -acetaminophen  (NORCO/VICODIN) 5-325 MG tablet [id number]  Take 1 tablet by mouth every 6 (six) hours as needed for moderate pain (pain score 4-6).  Patient not taking: Reported on 07/05/2024   James Cough, PA-C  Active   LOKELMA  10 g PACK packet [id number]  Take 1 packet by mouth daily. [provider]  Active Family  Member  sodium chloride  flush (NS) 0.9 % injection 3 mL [id number]   James Norman RAMAN, MD  Active             Home Care and Equipment/Supplies: Were Home Health Services Ordered?: No Any new equipment or medical supplies ordered?: No  Functional Questionnaire: Do you need assistance with bathing/showering or dressing?: No Do you need assistance with meal preparation?: Yes (His brother assists him) Do you need assistance with eating?: No Do you have difficulty maintaining continence: No Do you need assistance with getting out of bed/getting out of a chair/moving?: No Do you have difficulty managing or taking your medications?: Yes (His brother provides assistance)  Follow up appointments reviewed: PCP Follow-up appointment confirmed?: No (The patient's brother declines. He will schedule with the Nephrologist) MD Provider Line Number:[phone] Given: No Specialist Hospital Follow-up appointment confirmed?: Yes Date of Specialist follow-up appointment?: 07/09/24 Follow-Up Specialty Provider:: Vascular and Vein Specialist Do you need transportation to your follow-up appointment?: No Do you understand care options if your condition(s) worsen?: Yes-patient verbalized understanding  SDOH Interventions Today    Flowsheet Row Most Recent Value  SDOH Interventions   Food Insecurity Interventions Intervention Not Indicated  Housing Interventions Intervention Not Indicated  Transportation Interventions Intervention Not Indicated  Utilities Interventions Intervention Not Indicated    James Nicholson, BSN, RN [place]  VBCI - Population Health RN Care Manager [phone]

## 2024-07-08 ENCOUNTER — Ambulatory Visit: Payer: 59

## 2024-07-08 DIAGNOSIS — I5022 Chronic systolic (congestive) heart failure: Secondary | ICD-10-CM | POA: Diagnosis not present

## 2024-07-08 LAB — CUP PACEART REMOTE DEVICE CHECK
Battery Remaining Longevity: 42 mo
Battery Voltage: 2.94 V
Brady Statistic RV Percent Paced: 0.01 %
Date Time Interrogation Session: 20250911043626
HighPow Impedance: 254 Ohm
HighPow Impedance: 54 Ohm
Implantable Lead Connection Status: 753985
Implantable Lead Implant Date: 20210315
Implantable Lead Location: 753860
Implantable Lead Model: 6935
Implantable Pulse Generator Implant Date: 20190208
Lead Channel Impedance Value: 342 Ohm
Lead Channel Impedance Value: 399 Ohm
Lead Channel Pacing Threshold Amplitude: 0.5 V
Lead Channel Pacing Threshold Pulse Width: 0.4 ms
Lead Channel Sensing Intrinsic Amplitude: 9.625 mV
Lead Channel Sensing Intrinsic Amplitude: 9.625 mV
Lead Channel Setting Pacing Amplitude: 2 V
Lead Channel Setting Pacing Pulse Width: 0.4 ms
Lead Channel Setting Sensing Sensitivity: 0.3 mV
Zone Setting Status: 755011

## 2024-07-13 DIAGNOSIS — E875 Hyperkalemia: Secondary | ICD-10-CM | POA: Diagnosis not present

## 2024-07-14 ENCOUNTER — Ambulatory Visit: Attending: Vascular Surgery | Admitting: Physician Assistant

## 2024-07-14 VITALS — BP 128/64 | HR 109 | Temp 98.2°F | Wt 171.2 lb

## 2024-07-14 DIAGNOSIS — N186 End stage renal disease: Secondary | ICD-10-CM

## 2024-07-14 NOTE — Progress Notes (Signed)
  POST OPERATIVE OFFICE NOTE    CC:  F/u for surgery  HPI:  This is a 58 y.o. male who is s/p revision of right FA AVF with interposition with 6mm Artegraft and TDC placement on 06/11/2024 by Dr. Sheree.   Pt returns today with his family member.  He states he does not have pain/numbness in the right hand.   He did take out his own staples.  Family member does not know how he did it.   The pt is on dialysis T/T/S at [facility] location.  HD access hx: -right RC AVF 02/10/2013 Dr. Oris -fistulogram right RC AVF 08/09/2013 Dr. Eliza -ligation competing branches 08/13/2013 Dr. Serene -fistulogram 03/17/2020 Dr. Eliza -plication right RC AVF 05/08/2020 Dr. Eliza -plication right RC AVF 08/07/2020 DR. Eliza -fistulogram with balloon angioplasty & drug coated balloon angioplasty RC AVF 01/12/2024 Dr. Pearline -revision right FA AVF with interposition 6mm Artegraft 06/11/2024 Dr. Sheree  No Known Allergies  Current Outpatient Medications  Medication Sig Dispense Refill   AURYXIA  1 GM 210 MG(Fe) tablet Take 630 mg by mouth 3 (three) times daily with meals.     b complex-vitamin c-folic acid  (NEPHRO-VITE) 0.8 MG TABS tablet Take 1 tablet by mouth daily.     HYDROcodone -acetaminophen  (NORCO/VICODIN) 5-325 MG tablet Take 1 tablet by mouth every 6 (six) hours as needed for moderate pain (pain score 4-6). (Patient not taking: Reported on 07/05/2024) 20 tablet 0   LOKELMA  10 g PACK packet Take 1 packet by mouth daily.     Current Facility-Administered Medications  Medication Dose Route Frequency Provider Last Rate Last Admin   sodium chloride  flush (NS) 0.9 % injection 3 mL  3 mL Intravenous PRN Pearline Norman RAMAN, MD         ROS:  See HPI  Physical Exam:  Today's Vitals   07/14/24 1354  BP: 128/64  Pulse: (!) 109  Temp: 98.2 F (36.8 C)  TempSrc: Temporal  Weight: 171 lb 3.2 oz (77.7 kg)  PainSc: 0-No pain   Body mass index is 25.28 kg/m.   Incision:  well  appearing Extremities:   There is a palpable right radial pulse.   Motor and sensory are in tact.   There is a thrill/bruit present.  Access is  easily palpable     Assessment/Plan:  This is a 58 y.o. male who is s/p: revision of right FA AVF with interposition with 6mm Artegraft and TDC placement on 06/11/2024 by Dr. Sheree.   -the pt does not have evidence of steal. -pt's access can be used on 07/27/2024. -if pt has tunneled catheter, this can be removed at the discretion of the dialysis center once the pt's access has been successfully cannulated to their satisfaction.  -discussed with pt that access does not last forever and will need intervention or even new access at some point.  -the pt will follow up as needed   Lucie Apt, [facility] [phone]  Clinic MD:  Sheree

## 2024-07-15 DIAGNOSIS — Z992 Dependence on renal dialysis: Secondary | ICD-10-CM | POA: Diagnosis not present

## 2024-07-15 DIAGNOSIS — N2581 Secondary hyperparathyroidism of renal origin: Secondary | ICD-10-CM | POA: Diagnosis not present

## 2024-07-15 DIAGNOSIS — D689 Coagulation defect, unspecified: Secondary | ICD-10-CM | POA: Diagnosis not present

## 2024-07-15 DIAGNOSIS — D631 Anemia in chronic kidney disease: Secondary | ICD-10-CM | POA: Diagnosis not present

## 2024-07-15 DIAGNOSIS — E875 Hyperkalemia: Secondary | ICD-10-CM | POA: Diagnosis not present

## 2024-07-15 DIAGNOSIS — N186 End stage renal disease: Secondary | ICD-10-CM | POA: Diagnosis not present

## 2024-07-15 NOTE — Progress Notes (Signed)
Remote ICD Transmission.

## 2024-07-16 ENCOUNTER — Ambulatory Visit: Payer: Self-pay | Admitting: Internal Medicine

## 2024-07-17 DIAGNOSIS — N186 End stage renal disease: Secondary | ICD-10-CM | POA: Diagnosis not present

## 2024-07-20 DIAGNOSIS — N2581 Secondary hyperparathyroidism of renal origin: Secondary | ICD-10-CM | POA: Diagnosis not present

## 2024-07-27 DIAGNOSIS — N032 Chronic nephritic syndrome with diffuse membranous glomerulonephritis: Secondary | ICD-10-CM | POA: Diagnosis not present

## 2024-07-27 DIAGNOSIS — Z992 Dependence on renal dialysis: Secondary | ICD-10-CM | POA: Diagnosis not present

## 2024-07-27 DIAGNOSIS — N186 End stage renal disease: Secondary | ICD-10-CM | POA: Diagnosis not present

## 2024-08-02 ENCOUNTER — Encounter: Payer: Self-pay | Admitting: Internal Medicine

## 2024-08-02 ENCOUNTER — Ambulatory Visit: Attending: Internal Medicine | Admitting: Internal Medicine

## 2024-08-02 VITALS — BP 120/70 | HR 62 | Ht 69.0 in | Wt 166.0 lb

## 2024-08-02 DIAGNOSIS — I4901 Ventricular fibrillation: Secondary | ICD-10-CM

## 2024-08-02 DIAGNOSIS — I5022 Chronic systolic (congestive) heart failure: Secondary | ICD-10-CM | POA: Diagnosis not present

## 2024-08-02 LAB — CUP PACEART INCLINIC DEVICE CHECK
Date Time Interrogation Session: 20251006103317
Implantable Lead Connection Status: 753985
Implantable Lead Implant Date: 20210315
Implantable Lead Location: 753860
Implantable Lead Model: 6935
Implantable Pulse Generator Implant Date: 20190208

## 2024-08-02 NOTE — Progress Notes (Addendum)
 HPI James Nicholson returns today for followup. He is a pleasant 58 yo man with a h/o VF arrest, s/p ICD insertion, who has ESRD and is on HD. He has Wegeners. He has been treated with steroids. He has been stable since his ICD lead revision.  He has ESRD and multiple fistula revisions. No chest pain. He has class 2 CHF symptoms. His indwelling catheter has not yet been removed. There was some concern about SVC syndrome.   No Known Allergies   Current Outpatient Medications  Medication Sig Dispense Refill   AURYXIA  1 GM 210 MG(Fe) tablet Take 630 mg by mouth 3 (three) times daily with meals.     b complex-vitamin c-folic acid  (NEPHRO-VITE) 0.8 MG TABS tablet Take 1 tablet by mouth daily.     LOKELMA  10 g PACK packet Take 1 packet by mouth daily.     HYDROcodone -acetaminophen  (NORCO/VICODIN) 5-325 MG tablet Take 1 tablet by mouth every 6 (six) hours as needed for moderate pain (pain score 4-6). (Patient not taking: Reported on 08/02/2024) 20 tablet 0   Current Facility-Administered Medications  Medication Dose Route Frequency Provider Last Rate Last Admin   sodium chloride  flush (NS) 0.9 % injection 3 mL  3 mL Intravenous PRN Pearline Norman RAMAN, MD         Past Medical History:  Diagnosis Date   AICD (automatic cardioverter/defibrillator) present    Medtronic Visia AF MRIT VR [id number]   Anemia    Atrial fibrillation    Cardiomyopathy (HCC) 2010   Unclear Etiology: Last Echo 06/2009: EF 40-45%, severe Lateral & apical Hypokinesis (? CAD) ; Grade 2 DDysfxn. Mild conc LVH.    Cellulitis    Chronic kidney disease (CKD), stage V (HCC)    Dialysis Tue, Thurs, Sat   Confusion    Depression    no meds   Diabetes mellitus without complication (HCC)    type 2, diet controlled, no meds, pt. denies   Dyslipidemia    Enterobacter sepsis (HCC)    HLD (hyperlipidemia)    Hypertension    Pneumonia    S/P ICD (internal cardiac defibrillator) procedure 2010   VT Arrest (in [place] )   Schizophrenia  (HCC)    no meds   Wegener's disease, pulmonary 02/05/2013   Wegener's granulomatosis     ROS:   All systems reviewed and negative except as noted in the HPI.   Past Surgical History:  Procedure Laterality Date   A/V FISTULAGRAM Right 03/17/2020   Procedure: A/V FISTULAGRAM;  Surgeon: Eliza Lonni RAMAN, MD;  Location[facility] INVASIVE CV LAB;  Service: Cardiovascular;  Laterality: Right;   A/V FISTULAGRAM Right 01/12/2024   Procedure: A/V Fistulagram;  Surgeon: Pearline Norman RAMAN, MD;  Location: [facility] INVASIVE CV LAB;  Service: Cardiovascular;  Laterality: Right;   AV FISTULA PLACEMENT Right 02/10/2013   Procedure: ARTERIOVENOUS (AV) FISTULA CREATION;  Surgeon: Krystal JULIANNA Doing, MD;  Location: [facility] OR;  Service: Vascular;  Laterality: Right;  Right forearm radial/cephalic arterovenous fistula.    CARDIAC CATHETERIZATION  2010   [place] : In setting of VT arrest. Per brother's report, nonobstructive with no intervention   CARDIAC DEFIBRILLATOR PLACEMENT  2010   [place]    EYE SURGERY Right    FISTULOGRAM Right 08/09/2013   Procedure: FISTULOGRAM;  Surgeon: Lonni RAMAN Eliza, MD;  Location[facility] C[facility] LAB;  Service: Cardiovascular;  Laterality: Right;   ICD GENERATOR CHANGEOUT N/A 12/05/2017   Procedure: ICD GENERATOR CHANGEOUT;  Surgeon: Waddell Danelle ORN, MD;  Location: [facility] INVASIVE CV LAB;  Service: Cardiovascular;  Laterality: N/A;   ICD LEAD REMOVAL N/A 01/10/2020   Procedure: ICD LEAD REMOVAL AND IMPLANTATION OF NEW LEAD IN RIGHT VENTRICLE;  Surgeon: Waddell Danelle ORN, MD;  Location: [facility] OR;  Service: Cardiovascular;  Laterality: N/A;   INSERTION OF ARTERIOVENOUS (AV) ARTEGRAFT ARM Right 06/11/2024   Procedure: INSERTION OF ARTERIOVENOUS (AV) INTERPOSITION ARTEGRAFT ARM;  Surgeon: Sheree Penne Bruckner, MD;  Location: [facility] OR;  Service: Vascular;  Laterality: Right;   INSERTION OF DIALYSIS CATHETER Right 08/07/2020   Procedure: INSERTION OF DIALYSIS CATHETER;  Surgeon: Eliza Bruckner RAMAN, MD;   Location: [facility] OR;  Service: Vascular;  Laterality: Right;   INSERTION OF DIALYSIS CATHETER N/A 06/11/2024   Procedure: INSERTION OF DIALYSIS CATHETER;  Surgeon: Sheree Penne Bruckner, MD;  Location: [facility] OR;  Service: Vascular;  Laterality: N/A;   LIGATION OF ARTERIOVENOUS  FISTULA Right 05/08/2020   Procedure: PLICATION OF ARTERIOVENOUS  FISTULA RIGHT ARM;  Surgeon: Eliza Bruckner RAMAN, MD;  Location: [facility] OR;  Service: Vascular;  Laterality: Right;   LIGATION OF COMPETING BRANCHES OF ARTERIOVENOUS FISTULA Right 08/13/2013   Procedure: LIGATION OF COMPETING BRANCHES X5 OF ARTERIOVENOUS FISTULA- RIGHT ARM;  Surgeon: Gaile ORN New, MD;  Location: [facility] OR;  Service: Vascular;  Laterality: Right;   REVISION OF ARTERIOVENOUS GORETEX GRAFT Right 06/11/2024   Procedure: REVISION OF ARTERIOVENOUS GRAFT;  Surgeon: Sheree Penne Bruckner, MD;  Location: [facility] OR;  Service: Vascular;  Laterality: Right;   REVISON OF ARTERIOVENOUS FISTULA Right 08/07/2020   Procedure: PLICATION OF ARTERIOVENOUS FISTULA RIGHT;  Surgeon: Eliza Bruckner RAMAN, MD;  Location: [facility] OR;  Service: Vascular;  Laterality: Right;   TEE WITHOUT CARDIOVERSION N/A 01/10/2020   Procedure: TRANSESOPHAGEAL ECHOCARDIOGRAM (TEE);  Surgeon: Waddell Danelle ORN, MD;  Location: [facility] OR;  Service: Cardiovascular;  Laterality: N/A;   UPPER EXTREMITY INTERVENTION Right 01/12/2024   Procedure: UPPER EXTREMITY INTERVENTION;  Surgeon: Pearline Norman RAMAN, MD;  Location: [facility] INVASIVE CV LAB;  Service: Cardiovascular;  Laterality: Right;     Family History  Problem Relation Age of Onset   CAD Mother      Social History   Socioeconomic History   Marital status: Single    Spouse name: Not on file   Number of children: Not on file   Years of education: Not on file   Highest education level: Not on file  Occupational History   Not on file  Tobacco Use   Smoking status: Former    Current packs/day: 0.00    Types: Cigarettes    Quit date: 10/28/2008     Years since quitting: 15.7   Smokeless tobacco: Never  Vaping Use   Vaping status: Never Used  Substance and Sexual Activity   Alcohol use: No   Drug use: No   Sexual activity: Not on file  Other Topics Concern   Not on file  Social History Narrative   Not on file   Social Drivers of Health   Financial Resource Strain: Not on file  Food Insecurity: No Food Insecurity (07/05/2024)   Hunger Vital Sign    Worried About Running Out of Food in the Last Year: Never true    Ran Out of Food in the Last Year: Never true  Transportation Needs: No Transportation Needs (07/05/2024)   PRAPARE - Administrator, Civil Service (Medical): No    Lack of Transportation (Non-Medical): No  Physical Activity: Not on file  Stress: Not on file  Social  Connections: Unknown (03/10/2022)   [facility]   Social Network    Social Network: Not on file  Intimate Partner Violence: Not At Risk (07/05/2024)   Humiliation, Afraid, Rape, and Kick questionnaire    Fear of Current or Ex-Partner: No    Emotionally Abused: No    Physically Abused: No    Sexually Abused: No     BP 120/70 (BP Location: Left Arm, Patient Position: Sitting, Cuff Size: Normal)   Pulse 62   Ht 5' 9 (1.753 m)   Wt 166 lb (75.3 kg)   SpO2 98%   BMI 24.51 kg/m   Physical Exam:  Well appearing NAD HEENT: Unremarkable Neck:  No JVD, no thyromegally Lymphatics:  No adenopathy Back:  No CVA tenderness Lungs:  Clear HEART:  Regular rate rhythm, no murmurs, no rubs, no clicks Abd:  soft, positive bowel sounds, no organomegally, no rebound, no guarding Ext:  2 plus pulses, no edema, no cyanosis, no clubbing Skin:  No rashes no nodules Neuro:  CN II through XII intact, motor grossly intact  DEVICE  Normal device function.  See PaceArt for details.   Assess/Plan:  1. VF - he has had no additional episodes. He will undergo watchful waiting.  2. ICD - he is s/p ICD revision and his device is working  normally. 3. HTN - he has been unable to take his beta blocker due to low bp.  4. ESRD - he is on HD and still has an indwelling central catheter. His fistula is working. Would hope to remove the indwelling catheter as soon as possible to reduce the risk of infection.    Danelle Waddell HERO.D.

## 2024-08-02 NOTE — Patient Instructions (Signed)
 Medication Instructions:  Your physician recommends that you continue on your current medications as directed. Please refer to the Current Medication list given to you today.  *If you need a refill on your cardiac medications before your next appointment, please call your pharmacy*  Lab Work: None ordered.  If you have labs (blood work) drawn today and your tests are completely normal, you will receive your results only by: MyChart Message (if you have MyChart) OR A paper copy in the mail If you have any lab test that is abnormal or we need to change your treatment, we will call you to review the results.  Testing/Procedures: None ordered.   Follow-Up: At [facility], you and your health needs are our priority.  As part of our continuing mission to provide you with exceptional heart care, our providers are all part of one team.  This team includes your primary Cardiologist (physician) and Advanced Practice Providers or APPs (Physician Assistants and Nurse Practitioners) who all work together to provide you with the care you need, when you need it.  Your next appointment:   12 months with Dr Almetta

## 2024-08-25 ENCOUNTER — Other Ambulatory Visit (HOSPITAL_COMMUNITY): Payer: Self-pay | Admitting: Nephrology

## 2024-08-25 DIAGNOSIS — N186 End stage renal disease: Secondary | ICD-10-CM

## 2024-08-30 ENCOUNTER — Ambulatory Visit (HOSPITAL_COMMUNITY)
Admission: RE | Admit: 2024-08-30 | Discharge: 2024-08-30 | Disposition: A | Discharge status: Home / Self Care | Source: Ambulatory Visit | Attending: Nephrology | Admitting: Nephrology

## 2024-08-30 DIAGNOSIS — N186 End stage renal disease: Secondary | ICD-10-CM | POA: Insufficient documentation

## 2024-08-30 MED ORDER — LIDOCAINE-EPINEPHRINE 1 %-1:100000 IJ SOLN
20.0000 mL | Freq: Once | INTRAMUSCULAR | Status: AC
  Administered 2024-08-30: 8 mL via INTRADERMAL

## 2024-08-30 MED ORDER — LIDOCAINE-EPINEPHRINE 1 %-1:100000 IJ SOLN
INTRAMUSCULAR | Status: AC
  Filled 2024-08-30: qty 1

## 2024-08-30 NOTE — Procedures (Signed)
 Interventional Radiology Procedure Note  Risks and benefits of removal of hemodialysis catheter were discussed with the patient including, but not limited to bleeding, hematoma, infection, damage to adjacent structures.  All of the patient's questions were answered, patient is agreeable to proceed. Consent signed and in chart. PROCEDURE SUMMARY:  Successful removal of tunneled dialysis catheter.  No complications.   EBL = trace  Please see full dictation in imaging section of Epic for procedure details.  Patient interviewed with the assistance of tablet translator service   Delon JAYSON Beagle NP 08/30/2024 11:43 AM

## 2024-10-07 ENCOUNTER — Ambulatory Visit: Payer: 59

## 2024-10-07 DIAGNOSIS — I4901 Ventricular fibrillation: Secondary | ICD-10-CM

## 2024-10-08 LAB — CUP PACEART REMOTE DEVICE CHECK
Battery Remaining Longevity: 40 mo
Battery Voltage: 2.94 V
Brady Statistic RV Percent Paced: 0.01 %
Date Time Interrogation Session: 20251211001704
HighPow Impedance: 254 Ohm
HighPow Impedance: 49 Ohm
Implantable Lead Connection Status: 753985
Implantable Lead Implant Date: 20210315
Implantable Lead Location: 753860
Implantable Lead Model: 6935
Implantable Pulse Generator Implant Date: 20190208
Lead Channel Impedance Value: 304 Ohm
Lead Channel Impedance Value: 399 Ohm
Lead Channel Pacing Threshold Amplitude: 0.5 V
Lead Channel Pacing Threshold Pulse Width: 0.4 ms
Lead Channel Sensing Intrinsic Amplitude: 9.125 mV
Lead Channel Sensing Intrinsic Amplitude: 9.125 mV
Lead Channel Setting Pacing Amplitude: 2 V
Lead Channel Setting Pacing Pulse Width: 0.4 ms
Lead Channel Setting Sensing Sensitivity: 0.3 mV
Zone Setting Status: 755011

## 2024-10-10 ENCOUNTER — Ambulatory Visit: Payer: Self-pay | Admitting: Internal Medicine

## 2024-10-14 NOTE — Progress Notes (Signed)
 Remote ICD Transmission
